# Patient Record
Sex: Female | Born: 1935 | Race: White | Hispanic: No | Smoking: Never smoker
Health system: Southern US, Community
[De-identification: ages and names within clinical notes are randomized; demographics above are authoritative.]

## PROBLEM LIST (undated history)

## (undated) DIAGNOSIS — I2510 Atherosclerotic heart disease of native coronary artery without angina pectoris: Secondary | ICD-10-CM

## (undated) DIAGNOSIS — I471 Supraventricular tachycardia, unspecified: Secondary | ICD-10-CM

## (undated) DIAGNOSIS — Z9889 Other specified postprocedural states: Secondary | ICD-10-CM

## (undated) DIAGNOSIS — M7121 Synovial cyst of popliteal space [Baker], right knee: Secondary | ICD-10-CM

## (undated) DIAGNOSIS — S42409A Unspecified fracture of lower end of unspecified humerus, initial encounter for closed fracture: Secondary | ICD-10-CM

## (undated) DIAGNOSIS — R0600 Dyspnea, unspecified: Secondary | ICD-10-CM

## (undated) DIAGNOSIS — E785 Hyperlipidemia, unspecified: Secondary | ICD-10-CM

## (undated) DIAGNOSIS — Z9289 Personal history of other medical treatment: Secondary | ICD-10-CM

## (undated) DIAGNOSIS — K921 Melena: Secondary | ICD-10-CM

## (undated) DIAGNOSIS — D649 Anemia, unspecified: Secondary | ICD-10-CM

## (undated) DIAGNOSIS — K219 Gastro-esophageal reflux disease without esophagitis: Secondary | ICD-10-CM

## (undated) DIAGNOSIS — I2789 Other specified pulmonary heart diseases: Secondary | ICD-10-CM

## (undated) DIAGNOSIS — I700 Atherosclerosis of aorta: Secondary | ICD-10-CM

## (undated) DIAGNOSIS — I499 Cardiac arrhythmia, unspecified: Secondary | ICD-10-CM

## (undated) DIAGNOSIS — M1990 Unspecified osteoarthritis, unspecified site: Secondary | ICD-10-CM

## (undated) DIAGNOSIS — H35371 Puckering of macula, right eye: Secondary | ICD-10-CM

## (undated) DIAGNOSIS — J45909 Unspecified asthma, uncomplicated: Secondary | ICD-10-CM

## (undated) DIAGNOSIS — J189 Pneumonia, unspecified organism: Secondary | ICD-10-CM

## (undated) DIAGNOSIS — I6529 Occlusion and stenosis of unspecified carotid artery: Secondary | ICD-10-CM

## (undated) DIAGNOSIS — D869 Sarcoidosis, unspecified: Secondary | ICD-10-CM

## (undated) DIAGNOSIS — T7840XA Allergy, unspecified, initial encounter: Secondary | ICD-10-CM

## (undated) HISTORY — PX: BLADDER SURGERY: SHX569

## (undated) HISTORY — DX: Hyperlipidemia, unspecified: E78.5

## (undated) HISTORY — PX: OTHER SURGICAL HISTORY: SHX169

## (undated) HISTORY — DX: Unspecified fracture of lower end of unspecified humerus, initial encounter for closed fracture: S42.409A

## (undated) HISTORY — DX: Allergy, unspecified, initial encounter: T78.40XA

## (undated) HISTORY — DX: Other specified postprocedural states: Z98.890

## (undated) HISTORY — PX: COLONOSCOPY: SHX174

## (undated) HISTORY — DX: Puckering of macula, right eye: H35.371

## (undated) HISTORY — DX: Personal history of other medical treatment: Z92.89

## (undated) HISTORY — PX: CARPAL TUNNEL RELEASE: SHX101

## (undated) HISTORY — DX: Occlusion and stenosis of unspecified carotid artery: I65.29

## (undated) HISTORY — DX: Supraventricular tachycardia: I47.1

## (undated) HISTORY — DX: Other specified pulmonary heart diseases: I27.89

## (undated) HISTORY — DX: Melena: K92.1

## (undated) HISTORY — DX: Synovial cyst of popliteal space (Baker), right knee: M71.21

## (undated) HISTORY — PX: TRAPEZIUM RESECTION: SHX2576

## (undated) HISTORY — DX: Gastro-esophageal reflux disease without esophagitis: K21.9

## (undated) HISTORY — DX: Sarcoidosis, unspecified: D86.9

## (undated) HISTORY — PX: POLYPECTOMY: SHX149

## (undated) HISTORY — PX: SHOULDER ARTHROSCOPY W/ ROTATOR CUFF REPAIR: SHX2400

## (undated) HISTORY — DX: Supraventricular tachycardia, unspecified: I47.10

## (undated) HISTORY — DX: Atherosclerosis of aorta: I70.0

## (undated) HISTORY — PX: FOOT SURGERY: SHX648

## (undated) HISTORY — DX: Unspecified osteoarthritis, unspecified site: M19.90

## (undated) HISTORY — DX: Atherosclerotic heart disease of native coronary artery without angina pectoris: I25.10

---

## 1993-04-26 ENCOUNTER — Encounter: Payer: Self-pay | Admitting: Internal Medicine

## 1998-03-02 ENCOUNTER — Ambulatory Visit (HOSPITAL_COMMUNITY): Admission: RE | Admit: 1998-03-02 | Discharge: 1998-03-02 | Payer: Self-pay | Admitting: *Deleted

## 1998-08-17 ENCOUNTER — Encounter: Payer: Self-pay | Admitting: *Deleted

## 1998-08-17 ENCOUNTER — Ambulatory Visit (HOSPITAL_COMMUNITY): Admission: RE | Admit: 1998-08-17 | Discharge: 1998-08-17 | Payer: Self-pay | Admitting: *Deleted

## 1998-08-28 ENCOUNTER — Ambulatory Visit (HOSPITAL_COMMUNITY): Admission: RE | Admit: 1998-08-28 | Discharge: 1998-08-28 | Payer: Self-pay | Admitting: *Deleted

## 1998-11-09 ENCOUNTER — Encounter: Payer: Self-pay | Admitting: *Deleted

## 1998-11-09 ENCOUNTER — Ambulatory Visit (HOSPITAL_COMMUNITY): Admission: RE | Admit: 1998-11-09 | Discharge: 1998-11-09 | Payer: Self-pay | Admitting: *Deleted

## 1999-04-30 ENCOUNTER — Encounter: Payer: Self-pay | Admitting: Internal Medicine

## 2000-04-06 ENCOUNTER — Other Ambulatory Visit: Admission: RE | Admit: 2000-04-06 | Discharge: 2000-04-06 | Payer: Self-pay | Admitting: *Deleted

## 2000-09-14 ENCOUNTER — Encounter: Payer: Self-pay | Admitting: *Deleted

## 2000-09-14 ENCOUNTER — Ambulatory Visit (HOSPITAL_COMMUNITY): Admission: RE | Admit: 2000-09-14 | Discharge: 2000-09-14 | Payer: Self-pay | Admitting: *Deleted

## 2001-04-05 ENCOUNTER — Encounter: Payer: Self-pay | Admitting: Family Medicine

## 2001-04-05 ENCOUNTER — Ambulatory Visit (HOSPITAL_COMMUNITY): Admission: RE | Admit: 2001-04-05 | Discharge: 2001-04-05 | Payer: Self-pay | Admitting: Family Medicine

## 2001-08-07 ENCOUNTER — Ambulatory Visit (HOSPITAL_BASED_OUTPATIENT_CLINIC_OR_DEPARTMENT_OTHER): Admission: RE | Admit: 2001-08-07 | Discharge: 2001-08-08 | Payer: Self-pay | Admitting: Orthopedic Surgery

## 2001-08-08 ENCOUNTER — Emergency Department (HOSPITAL_COMMUNITY): Admission: EM | Admit: 2001-08-08 | Discharge: 2001-08-09 | Payer: Self-pay | Admitting: Emergency Medicine

## 2002-01-22 ENCOUNTER — Ambulatory Visit (HOSPITAL_COMMUNITY): Admission: RE | Admit: 2002-01-22 | Discharge: 2002-01-22 | Payer: Self-pay | Admitting: Family Medicine

## 2002-01-22 ENCOUNTER — Encounter: Payer: Self-pay | Admitting: Family Medicine

## 2002-06-12 ENCOUNTER — Ambulatory Visit (HOSPITAL_COMMUNITY): Admission: RE | Admit: 2002-06-12 | Discharge: 2002-06-12 | Payer: Self-pay | Admitting: Family Medicine

## 2002-06-12 ENCOUNTER — Encounter: Payer: Self-pay | Admitting: Family Medicine

## 2002-07-08 ENCOUNTER — Encounter: Payer: Self-pay | Admitting: Orthopedic Surgery

## 2002-07-08 ENCOUNTER — Encounter: Admission: RE | Admit: 2002-07-08 | Discharge: 2002-07-08 | Payer: Self-pay | Admitting: Orthopedic Surgery

## 2002-07-09 ENCOUNTER — Ambulatory Visit (HOSPITAL_BASED_OUTPATIENT_CLINIC_OR_DEPARTMENT_OTHER): Admission: RE | Admit: 2002-07-09 | Discharge: 2002-07-09 | Payer: Self-pay | Admitting: Orthopedic Surgery

## 2002-10-01 ENCOUNTER — Ambulatory Visit (HOSPITAL_BASED_OUTPATIENT_CLINIC_OR_DEPARTMENT_OTHER): Admission: RE | Admit: 2002-10-01 | Discharge: 2002-10-02 | Payer: Self-pay | Admitting: Orthopedic Surgery

## 2003-05-08 ENCOUNTER — Ambulatory Visit (HOSPITAL_COMMUNITY): Admission: RE | Admit: 2003-05-08 | Discharge: 2003-05-08 | Payer: Self-pay | Admitting: Family Medicine

## 2003-05-19 ENCOUNTER — Ambulatory Visit (HOSPITAL_COMMUNITY): Admission: RE | Admit: 2003-05-19 | Discharge: 2003-05-19 | Payer: Self-pay | Admitting: Family Medicine

## 2003-06-03 ENCOUNTER — Ambulatory Visit (HOSPITAL_COMMUNITY): Admission: RE | Admit: 2003-06-03 | Discharge: 2003-06-03 | Payer: Self-pay | Admitting: Family Medicine

## 2003-06-13 ENCOUNTER — Ambulatory Visit (HOSPITAL_COMMUNITY): Admission: RE | Admit: 2003-06-13 | Discharge: 2003-06-13 | Payer: Self-pay

## 2003-12-30 ENCOUNTER — Ambulatory Visit: Payer: Self-pay | Admitting: Cardiology

## 2003-12-31 ENCOUNTER — Ambulatory Visit (HOSPITAL_COMMUNITY): Admission: RE | Admit: 2003-12-31 | Discharge: 2003-12-31 | Payer: Self-pay | Admitting: Family Medicine

## 2004-02-09 ENCOUNTER — Ambulatory Visit (HOSPITAL_COMMUNITY): Admission: RE | Admit: 2004-02-09 | Discharge: 2004-02-09 | Payer: Self-pay | Admitting: Family Medicine

## 2004-02-13 ENCOUNTER — Ambulatory Visit (HOSPITAL_COMMUNITY): Admission: RE | Admit: 2004-02-13 | Discharge: 2004-02-13 | Payer: Self-pay | Admitting: Family Medicine

## 2004-03-08 ENCOUNTER — Ambulatory Visit (HOSPITAL_COMMUNITY): Admission: RE | Admit: 2004-03-08 | Discharge: 2004-03-08 | Payer: Self-pay | Admitting: Family Medicine

## 2004-05-31 ENCOUNTER — Encounter: Payer: Self-pay | Admitting: Internal Medicine

## 2004-05-31 ENCOUNTER — Ambulatory Visit: Payer: Self-pay | Admitting: Internal Medicine

## 2004-05-31 ENCOUNTER — Ambulatory Visit (HOSPITAL_COMMUNITY): Admission: RE | Admit: 2004-05-31 | Discharge: 2004-05-31 | Payer: Self-pay | Admitting: Internal Medicine

## 2004-10-01 ENCOUNTER — Ambulatory Visit: Payer: Self-pay | Admitting: Cardiology

## 2004-10-20 ENCOUNTER — Ambulatory Visit: Payer: Self-pay

## 2005-01-12 ENCOUNTER — Ambulatory Visit (HOSPITAL_COMMUNITY): Admission: RE | Admit: 2005-01-12 | Discharge: 2005-01-12 | Payer: Self-pay | Admitting: Family Medicine

## 2005-05-30 ENCOUNTER — Ambulatory Visit (HOSPITAL_COMMUNITY): Admission: RE | Admit: 2005-05-30 | Discharge: 2005-05-30 | Payer: Self-pay | Admitting: Family Medicine

## 2005-07-19 ENCOUNTER — Ambulatory Visit (HOSPITAL_COMMUNITY): Admission: RE | Admit: 2005-07-19 | Discharge: 2005-07-19 | Payer: Self-pay | Admitting: Family Medicine

## 2005-08-09 ENCOUNTER — Ambulatory Visit: Payer: Self-pay | Admitting: Cardiology

## 2005-08-23 ENCOUNTER — Ambulatory Visit: Payer: Self-pay

## 2005-09-07 ENCOUNTER — Ambulatory Visit (HOSPITAL_COMMUNITY): Admission: RE | Admit: 2005-09-07 | Discharge: 2005-09-07 | Payer: Self-pay | Admitting: Family Medicine

## 2006-02-10 ENCOUNTER — Ambulatory Visit (HOSPITAL_COMMUNITY): Admission: RE | Admit: 2006-02-10 | Discharge: 2006-02-10 | Payer: Self-pay | Admitting: Family Medicine

## 2006-05-10 ENCOUNTER — Encounter: Admission: RE | Admit: 2006-05-10 | Discharge: 2006-05-10 | Payer: Self-pay | Admitting: Internal Medicine

## 2006-07-06 ENCOUNTER — Ambulatory Visit: Payer: Self-pay | Admitting: Internal Medicine

## 2006-11-21 ENCOUNTER — Ambulatory Visit: Payer: Self-pay | Admitting: Cardiology

## 2007-01-26 ENCOUNTER — Emergency Department (HOSPITAL_COMMUNITY): Admission: EM | Admit: 2007-01-26 | Discharge: 2007-01-26 | Payer: Self-pay | Admitting: Emergency Medicine

## 2007-02-05 ENCOUNTER — Ambulatory Visit (HOSPITAL_COMMUNITY): Admission: RE | Admit: 2007-02-05 | Discharge: 2007-02-05 | Payer: Self-pay | Admitting: Family Medicine

## 2007-03-05 ENCOUNTER — Ambulatory Visit (HOSPITAL_COMMUNITY): Admission: RE | Admit: 2007-03-05 | Discharge: 2007-03-05 | Payer: Self-pay | Admitting: Family Medicine

## 2007-08-15 ENCOUNTER — Ambulatory Visit: Payer: Self-pay | Admitting: Cardiology

## 2007-09-18 ENCOUNTER — Ambulatory Visit (HOSPITAL_COMMUNITY): Admission: RE | Admit: 2007-09-18 | Discharge: 2007-09-18 | Payer: Self-pay | Admitting: Internal Medicine

## 2007-09-25 ENCOUNTER — Other Ambulatory Visit: Admission: RE | Admit: 2007-09-25 | Discharge: 2007-09-25 | Payer: Self-pay | Admitting: Obstetrics and Gynecology

## 2007-11-30 ENCOUNTER — Encounter: Admission: RE | Admit: 2007-11-30 | Discharge: 2007-11-30 | Payer: Self-pay | Admitting: Orthopedic Surgery

## 2007-12-17 ENCOUNTER — Encounter: Admission: RE | Admit: 2007-12-17 | Discharge: 2007-12-17 | Payer: Self-pay | Admitting: Orthopedic Surgery

## 2008-03-17 ENCOUNTER — Ambulatory Visit (HOSPITAL_COMMUNITY): Admission: RE | Admit: 2008-03-17 | Discharge: 2008-03-17 | Payer: Self-pay | Admitting: Internal Medicine

## 2008-03-20 ENCOUNTER — Ambulatory Visit (HOSPITAL_COMMUNITY): Admission: RE | Admit: 2008-03-20 | Discharge: 2008-03-20 | Payer: Self-pay | Admitting: Internal Medicine

## 2008-03-24 ENCOUNTER — Encounter: Payer: Self-pay | Admitting: Cardiology

## 2008-03-24 ENCOUNTER — Ambulatory Visit: Payer: Self-pay

## 2008-08-08 DIAGNOSIS — E785 Hyperlipidemia, unspecified: Secondary | ICD-10-CM | POA: Insufficient documentation

## 2008-08-08 DIAGNOSIS — I471 Supraventricular tachycardia: Secondary | ICD-10-CM

## 2008-08-08 DIAGNOSIS — D869 Sarcoidosis, unspecified: Secondary | ICD-10-CM | POA: Insufficient documentation

## 2008-08-08 DIAGNOSIS — K219 Gastro-esophageal reflux disease without esophagitis: Secondary | ICD-10-CM | POA: Insufficient documentation

## 2008-08-14 ENCOUNTER — Ambulatory Visit: Payer: Self-pay | Admitting: Cardiology

## 2008-08-14 ENCOUNTER — Encounter: Payer: Self-pay | Admitting: Internal Medicine

## 2008-08-14 DIAGNOSIS — Z85828 Personal history of other malignant neoplasm of skin: Secondary | ICD-10-CM

## 2008-08-14 DIAGNOSIS — Z8669 Personal history of other diseases of the nervous system and sense organs: Secondary | ICD-10-CM

## 2008-08-14 DIAGNOSIS — M1990 Unspecified osteoarthritis, unspecified site: Secondary | ICD-10-CM | POA: Insufficient documentation

## 2008-08-18 ENCOUNTER — Ambulatory Visit: Payer: Self-pay | Admitting: Internal Medicine

## 2008-08-18 DIAGNOSIS — Z860100 Personal history of colon polyps, unspecified: Secondary | ICD-10-CM | POA: Insufficient documentation

## 2008-08-18 DIAGNOSIS — K625 Hemorrhage of anus and rectum: Secondary | ICD-10-CM | POA: Insufficient documentation

## 2008-08-18 DIAGNOSIS — R197 Diarrhea, unspecified: Secondary | ICD-10-CM | POA: Insufficient documentation

## 2008-08-19 ENCOUNTER — Ambulatory Visit: Payer: Self-pay | Admitting: Internal Medicine

## 2008-08-19 ENCOUNTER — Encounter: Payer: Self-pay | Admitting: Internal Medicine

## 2008-08-20 ENCOUNTER — Encounter: Payer: Self-pay | Admitting: Internal Medicine

## 2008-10-23 ENCOUNTER — Encounter: Payer: Self-pay | Admitting: Cardiology

## 2008-11-17 ENCOUNTER — Telehealth: Payer: Self-pay | Admitting: Cardiology

## 2008-11-20 ENCOUNTER — Ambulatory Visit: Payer: Self-pay | Admitting: Cardiology

## 2008-11-28 ENCOUNTER — Ambulatory Visit (HOSPITAL_COMMUNITY): Admission: RE | Admit: 2008-11-28 | Discharge: 2008-11-28 | Payer: Self-pay | Admitting: Internal Medicine

## 2008-11-28 ENCOUNTER — Encounter: Payer: Self-pay | Admitting: Cardiology

## 2008-11-28 ENCOUNTER — Ambulatory Visit: Payer: Self-pay

## 2008-11-28 ENCOUNTER — Ambulatory Visit: Payer: Self-pay | Admitting: Internal Medicine

## 2008-12-12 ENCOUNTER — Inpatient Hospital Stay (HOSPITAL_COMMUNITY): Admission: RE | Admit: 2008-12-12 | Discharge: 2008-12-15 | Payer: Self-pay | Admitting: Orthopedic Surgery

## 2009-02-21 HISTORY — PX: HIP SURGERY: SHX245

## 2009-02-23 DIAGNOSIS — R0989 Other specified symptoms and signs involving the circulatory and respiratory systems: Secondary | ICD-10-CM | POA: Insufficient documentation

## 2009-02-23 DIAGNOSIS — G5600 Carpal tunnel syndrome, unspecified upper limb: Secondary | ICD-10-CM | POA: Insufficient documentation

## 2009-02-23 DIAGNOSIS — E559 Vitamin D deficiency, unspecified: Secondary | ICD-10-CM | POA: Insufficient documentation

## 2009-03-27 ENCOUNTER — Emergency Department (HOSPITAL_COMMUNITY): Admission: EM | Admit: 2009-03-27 | Discharge: 2009-03-28 | Payer: Self-pay | Admitting: Emergency Medicine

## 2009-05-07 ENCOUNTER — Ambulatory Visit (HOSPITAL_COMMUNITY): Admission: RE | Admit: 2009-05-07 | Discharge: 2009-05-07 | Payer: Self-pay | Admitting: Internal Medicine

## 2009-06-05 ENCOUNTER — Encounter (INDEPENDENT_AMBULATORY_CARE_PROVIDER_SITE_OTHER): Payer: Self-pay

## 2009-06-25 ENCOUNTER — Ambulatory Visit (HOSPITAL_COMMUNITY): Admission: RE | Admit: 2009-06-25 | Discharge: 2009-06-25 | Payer: Self-pay | Admitting: Internal Medicine

## 2009-11-20 ENCOUNTER — Ambulatory Visit: Payer: Self-pay | Admitting: Cardiology

## 2009-12-08 ENCOUNTER — Encounter: Payer: Self-pay | Admitting: Cardiology

## 2009-12-08 ENCOUNTER — Ambulatory Visit: Payer: Self-pay

## 2010-03-14 ENCOUNTER — Encounter: Payer: Self-pay | Admitting: Internal Medicine

## 2010-03-14 ENCOUNTER — Encounter: Payer: Self-pay | Admitting: Family Medicine

## 2010-03-23 NOTE — Letter (Signed)
Summary: Recall Colonoscopy/Endoscopy, Change to Office Visit  [facility]  [address]   [place], [place] [address]   Phone: [phone]  Fax: [phone]      June 05, 2009   PALMYRA ROGACKI [address] [place], [place]  [address] Oct 05, 1934   Dear Ms. Sheeley,   According to our records, it is time for you to schedule a Colonoscopy/Endoscopy. However, after reviewing your medical record, we recommend an office visit in order to determine your need for a repeat procedure.  Please call [phone] at your convenience to schedule an office visit. If you have any questions or concerns, please feel free to contact our office.   Sincerely,   Cloria Spring LPN  [facility] Ph: [phone]   Fax: [phone]

## 2010-03-23 NOTE — Assessment & Plan Note (Signed)
Summary: per check out/sf   Visit Type:  Follow-up Primary Provider:  Larina Earthly, MD   History of Present Illness: Patient had her hip done, and she is doing well.  She went to [facility] for fifteen days.  Genevieve Norlander came in for a short time.  Does not have any cardiac symptoms.  Overall stable.  Has had no noticeable SVT, and now she has had virtually no problems at all.  Denies shortness of breath.  Current Medications (verified): 1)  Nexium 40 Mg Cpdr (Esomeprazole Magnesium) .... Take 1 Tablet By Mouth Once Daily As Needed 2)  Pravastatin Sodium 10 Mg Tabs (Pravastatin Sodium) .... Take 1 Tablet By Mouth Once A Day 3)  Cardizem Cd 180 Mg Xr24h-Cap (Diltiazem Hcl Coated Beads) .... Take 1 Capsule By Mouth Once A Day  Allergies: 1)  ! Sulfa 2)  ! Percocet 3)  ! Morphine 4)  ! * Merpergan Forte  Vital Signs:  Patient profile:   76 year old female Height:      63 inches Weight:      130.75 pounds BMI:     23.25 Pulse rate:   60 / minute Pulse rhythm:   regular Resp:     18 per minute BP sitting:   120 / 60  (left arm) Cuff size:   regular  Vitals Entered By: Vikki Ports (November 20, 2009 9:52 AM)  Physical Exam  General:  Well developed, well nourished, in no acute distress. Head:  normocephalic and atraumatic Eyes:  PERRLA/EOM intact; conjunctiva and lids normal. Lungs:  Clear bilaterally to auscultation and percussion. Heart:  PMI non displaced.  Normal S1 and S2.  Soft apical murmur noted laterally. Extremities:  No edema. Neurologic:  Alert and oriented x 3.   EKG  Procedure date:  11/20/2009  Findings:      NSR.  WNL.   Impression & Recommendations:  Problem # 1:  PAROXYSMAL SUPRAVENTRICULAR TACHYCARDIA (ICD-427.0)  No clinical recurrence. No problems with meds.  Need to avoid simva and other agents reviewed.    Her updated medication list for this problem includes:    Cardizem Cd 180 Mg Xr24h-cap (Diltiazem hcl coated beads) .Marland Kitchen... Take 1 capsule by  mouth once a day  Orders: EKG w/ Interpretation ([address])  Problem # 2:  HYPERLIPIDEMIA (ICD-272.4) followed by Dr. Felipa Eth Her updated medication list for this problem includes:    Pravastatin Sodium 10 Mg Tabs (Pravastatin sodium) .Marland Kitchen... 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  Last study now in EMR.  Will repeat doppler study for followup.  Continue medical therapy.   Orders: Carotid Duplex (Carotid Duplex)  Patient Instructions: 1)  Your physician recommends that you schedule a follow-up appointment in: 1 year 2)  Your physician recommends that you continue on your current medications as directed. Please refer to the Current Medication list given to you today. 3)  Your physician has requested that you have a carotid duplex. This test is an ultrasound of the carotid arteries in your neck. It looks at blood flow through these arteries that supply the brain with blood. Allow one hour for this exam. There are no restrictions or special instructions.

## 2010-04-16 ENCOUNTER — Other Ambulatory Visit: Payer: Self-pay | Admitting: Internal Medicine

## 2010-04-16 DIAGNOSIS — K219 Gastro-esophageal reflux disease without esophagitis: Secondary | ICD-10-CM

## 2010-04-28 ENCOUNTER — Other Ambulatory Visit: Payer: Self-pay

## 2010-05-03 ENCOUNTER — Ambulatory Visit
Admission: RE | Admit: 2010-05-03 | Discharge: 2010-05-03 | Disposition: A | Discharge status: Home / Self Care | Payer: Medicare Other | Source: Ambulatory Visit | Attending: Internal Medicine | Admitting: Internal Medicine

## 2010-05-03 DIAGNOSIS — K219 Gastro-esophageal reflux disease without esophagitis: Secondary | ICD-10-CM

## 2010-05-10 DIAGNOSIS — D131 Benign neoplasm of stomach: Secondary | ICD-10-CM | POA: Insufficient documentation

## 2010-05-27 LAB — URINALYSIS, ROUTINE W REFLEX MICROSCOPIC
Glucose, UA: NEGATIVE mg/dL
Hgb urine dipstick: NEGATIVE
Nitrite: NEGATIVE

## 2010-05-27 LAB — BASIC METABOLIC PANEL
BUN: 8 mg/dL (ref 6–23)
CO2: 28 mEq/L (ref 19–32)
CO2: 28 mEq/L (ref 19–32)
Creatinine, Ser: 0.68 mg/dL (ref 0.4–1.2)
GFR calc Af Amer: >60 mL/min (ref >60)
Potassium: 3.4 mEq/L — ABNORMAL LOW (ref 3.5–5.1)
Potassium: 3.9 mEq/L (ref 3.5–5.1)
Sodium: 136 mEq/L (ref 135–145)

## 2010-05-27 LAB — CBC
HCT: 26.8 % — ABNORMAL LOW (ref 36.0–46.0)
HCT: 28.1 % — ABNORMAL LOW (ref 36.0–46.0)
HCT: 41.5 % (ref 36.0–46.0)
Hemoglobin: 10.5 g/dL — ABNORMAL LOW (ref 12.0–15.0)
Hemoglobin: 14.2 g/dL (ref 12.0–15.0)
Hemoglobin: 9.5 g/dL — ABNORMAL LOW (ref 12.0–15.0)
MCHC: 33.8 g/dL (ref 30.0–36.0)
MCHC: 34.3 g/dL (ref 30.0–36.0)
MCHC: 34.3 g/dL (ref 30.0–36.0)
MCV: 99.1 fL (ref 78.0–100.0)
Platelets: 144 10*3/uL — ABNORMAL LOW (ref 150–400)
Platelets: 153 10*3/uL (ref 150–400)
Platelets: 209 10*3/uL (ref 150–400)
RDW: 12.2 % (ref 11.5–15.5)
RDW: 12.4 % (ref 11.5–15.5)
RDW: 12.7 % (ref 11.5–15.5)
WBC: 7.3 10*3/uL (ref 4.0–10.5)

## 2010-05-27 LAB — COMPREHENSIVE METABOLIC PANEL
ALT: 15 U/L (ref 0–35)
AST: 20 U/L (ref 0–37)
Albumin: 3.9 g/dL (ref 3.5–5.2)
BUN: 19 mg/dL (ref 6–23)
CO2: 27 mEq/L (ref 19–32)
Calcium: 9.3 mg/dL (ref 8.4–10.5)
Chloride: 105 mEq/L (ref 96–112)
Creatinine, Ser: 0.81 mg/dL (ref 0.4–1.2)
GFR calc Af Amer: >60 mL/min (ref >60)
GFR calc non Af Amer: >60 mL/min (ref >60)
Potassium: 4.1 mEq/L (ref 3.5–5.1)
Sodium: 140 mEq/L (ref 135–145)
Total Bilirubin: 0.4 mg/dL (ref 0.3–1.2)

## 2010-05-27 LAB — PROTIME-INR
INR: 1 (ref 0.00–1.49)
INR: 1.15 (ref 0.00–1.49)
INR: 1.72 — ABNORMAL HIGH (ref 0.00–1.49)
Prothrombin Time: 13.1 seconds (ref 11.6–15.2)
Prothrombin Time: 14.6 seconds (ref 11.6–15.2)
Prothrombin Time: 25.6 seconds — ABNORMAL HIGH (ref 11.6–15.2)

## 2010-05-27 LAB — APTT: aPTT: 26 seconds (ref 24–37)

## 2010-05-31 ENCOUNTER — Other Ambulatory Visit (HOSPITAL_COMMUNITY): Payer: Self-pay | Admitting: Internal Medicine

## 2010-05-31 DIAGNOSIS — Z139 Encounter for screening, unspecified: Secondary | ICD-10-CM

## 2010-06-03 ENCOUNTER — Ambulatory Visit (HOSPITAL_COMMUNITY): Payer: Medicare Other

## 2010-06-07 ENCOUNTER — Ambulatory Visit (HOSPITAL_COMMUNITY)
Admission: RE | Admit: 2010-06-07 | Discharge: 2010-06-07 | Disposition: A | Discharge status: Home / Self Care | Payer: Medicare Other | Source: Ambulatory Visit | Attending: Internal Medicine | Admitting: Internal Medicine

## 2010-06-07 ENCOUNTER — Ambulatory Visit (HOSPITAL_COMMUNITY): Payer: Medicare Other

## 2010-06-07 DIAGNOSIS — Z139 Encounter for screening, unspecified: Secondary | ICD-10-CM

## 2010-06-07 DIAGNOSIS — Z1231 Encounter for screening mammogram for malignant neoplasm of breast: Secondary | ICD-10-CM | POA: Insufficient documentation

## 2010-06-08 ENCOUNTER — Encounter: Payer: Self-pay | Admitting: Internal Medicine

## 2010-06-08 ENCOUNTER — Ambulatory Visit (INDEPENDENT_AMBULATORY_CARE_PROVIDER_SITE_OTHER): Payer: Medicare Other | Admitting: Internal Medicine

## 2010-06-08 DIAGNOSIS — Z860100 Personal history of colon polyps, unspecified: Secondary | ICD-10-CM

## 2010-06-08 DIAGNOSIS — K219 Gastro-esophageal reflux disease without esophagitis: Secondary | ICD-10-CM

## 2010-06-08 DIAGNOSIS — R933 Abnormal findings on diagnostic imaging of other parts of digestive tract: Secondary | ICD-10-CM

## 2010-06-08 DIAGNOSIS — D131 Benign neoplasm of stomach: Secondary | ICD-10-CM

## 2010-06-08 NOTE — Progress Notes (Signed)
HISTORY OF PRESENT ILLNESS:  [name] is a 76 y.o. female with the below listed medical history. She presents today regarding vague abdominal complaints and an abnormal upper GI series. She was last evaluated in June of 2010 regarding crampy lower abdominal pain, diarrhea, and rectal bleeding. There is a family history of colon cancer. See that dictation for details. She subsequently underwent complete colonoscopy 08/19/2008. This was remarkable for diminutive colon polyp which was removed and found to be a tubular adenoma. Otherwise normal examination except for internal hemorrhoids. Followup in 5 years recommended. Her current history is that of a sensation of hunger and weakness that has occurred periodically over the past few months. She describes a sensation of the knee deep, even if she has eaten recently. She does stay on Nexium chronically for reflux. She does report rare indigestion or pyrosis but does not feel that this is the cause for symptoms. She denies abdominal pain or weight loss. No bleeding. Upper GI series performed 05/03/2010 revealed evidence of reflux disease. No delay in the passage of the barium tablet though the cricopharyngeus muscle was prominent. Tertiary contractions present. Small polypoid defect in the body and antrum of the stomach noted. Possibly polyps. She is now sent.  REVIEW OF SYSTEMS:  All non-GI ROS negative except for sinus allergy trouble, arthritis, and fatigue.  Past Medical History  Diagnosis Date  . Paroxysmal supraventricular tachycardia   . Other chronic pulmonary heart diseases   . Other and unspecified hyperlipidemia   . Other symptoms involving cardiovascular system   . Personal history of other diseases of digestive system   . Sarcoidosis   . Esophageal reflux     Past Surgical History  Procedure Date  . Resection of trapezium     Rt.  . Carpal tunnel release     left.  . Shoulder arthroscopy w/ rotator cuff repair   . Foot  surgery     rt.  . Bladder surgery   . Arm surgery     rt.    Social History [name]  reports that she has never smoked. She has never used smokeless tobacco. She reports that she does not drink alcohol or use illicit drugs.  family history includes Colon cancer (age of onset:61) in her mother; Heart disease in her father; and Lung cancer in her father.  Allergies  Allergen Reactions  . Morphine   . Oxycodone-Acetaminophen   . Sulfonamide Derivatives        PHYSICAL EXAMINATION: Vital signs: BP 138/68  Pulse 84  Ht 5\' 3"  (1.6 m)  Wt 132 lb (59.875 kg)  BMI 23.38 kg/m2  Constitutional: generally well-appearing, no acute distress Psychiatric: alert and oriented x3, cooperative Eyes: extraocular movements intact, anicteric, conjunctiva pink Mouth: oral pharynx moist, no lesions Neck: supple no lymphadenopathy Cardiovascular: heart regular rate and rhythm, no murmur Lungs: clear to auscultation bilaterally Abdomen: soft, nontender, nondistended, no obvious ascites, no peritoneal signs, normal bowel sounds, no organomegaly Extremities: no lower extremity edema bilaterally Skin: no lesions on visible extremities Neuro: No focal deficits.  ASSESSMENT:  #1. Vague sensation of hunger and fatigue. Etiology uncertain. Does not appear to be reflux. #2. GERD. For the most part controlled with PPI. #3. Upper GI series describing polypoid lesions of the stomach.  PLAN:  #1. Continue PPI #2. Continue reflux precautions #3. Schedule upper endoscopy to evaluate abnormalities on upper GI series.The nature of the procedure, as well as the risks, benefits, and alternatives were carefully and  thoroughly reviewed with the patient. Ample time for discussion and questions allowed. The patient understood, was satisfied, and agreed to proceed.  #4. Family history of colon cancer and personal history of adenomatous colon polyps. Last examination 2010. Due for routine followup  2015

## 2010-06-08 NOTE — Patient Instructions (Signed)
EGD [facility] 4th floor 06/15/10 11:30 am Endoscopy brochure given for you to review.

## 2010-06-09 ENCOUNTER — Telehealth: Payer: Self-pay | Admitting: Internal Medicine

## 2010-06-09 NOTE — Telephone Encounter (Signed)
Called patient.  Wanted to reschedule her Endo and front desk scheduled for April 27th.

## 2010-06-15 ENCOUNTER — Other Ambulatory Visit: Payer: Medicare Other | Admitting: Internal Medicine

## 2010-06-17 ENCOUNTER — Encounter: Payer: Self-pay | Admitting: *Deleted

## 2010-06-18 ENCOUNTER — Ambulatory Visit (AMBULATORY_SURGERY_CENTER): Payer: Medicare Other | Admitting: Internal Medicine

## 2010-06-18 ENCOUNTER — Encounter: Payer: Self-pay | Admitting: Internal Medicine

## 2010-06-18 DIAGNOSIS — D131 Benign neoplasm of stomach: Secondary | ICD-10-CM

## 2010-06-18 DIAGNOSIS — R933 Abnormal findings on diagnostic imaging of other parts of digestive tract: Secondary | ICD-10-CM

## 2010-06-18 DIAGNOSIS — K219 Gastro-esophageal reflux disease without esophagitis: Secondary | ICD-10-CM

## 2010-06-18 DIAGNOSIS — K449 Diaphragmatic hernia without obstruction or gangrene: Secondary | ICD-10-CM

## 2010-06-18 MED ORDER — SODIUM CHLORIDE 0.9 % IV SOLN: 500.0000 mL | INTRAVENOUS | Status: DC

## 2010-06-18 NOTE — Patient Instructions (Signed)
Multiple gastric polyps seen and were biopsied.  A small hiatal hernia also seen.  Normal esophagus and duodenum seen.  Continue nexium.  Anti-reflux information given.  Follow up with Dr. Marina Goodell as needed.  You will receive a letter in 2 - 3 weeks with pathology results.  Continue your medications today at normal schedule.  Please call with any questions or concerns.

## 2010-06-21 ENCOUNTER — Telehealth: Payer: Self-pay | Admitting: *Deleted

## 2010-06-21 NOTE — Telephone Encounter (Signed)

## 2010-06-22 ENCOUNTER — Encounter: Payer: Self-pay | Admitting: Internal Medicine

## 2010-07-06 NOTE — Assessment & Plan Note (Signed)
NAMEMarland Kitchen  CHARLY, HUNTON               CHART#:  [id number]   DATE:  07/06/2006                       DOB:  1934-06-20   PRESENTING COMPLAINT:  Hematochezia.   SUBJECTIVE:  [name] is a 76 year old Caucasian female who is here for  scheduled visit.  She recently had bronchitis and a lot of coughing  spells.  She broke her rib.  She was on pain medicine and developed  constipation.  Yesterday, she noted fresh blood with her bowel movements  and then later she noted some more on tissue.  She did not see any blood  this morning.  Because her family history of colon carcinoma, she got  very concerned and thought she should be evaluated.  She denies  abdominal pain, anorexia or weight loss.  No history of frank bleeding.   [name] has undergone multiple colonoscopies in the past.  She had one  in May 1986 by Dr. Park Breed for rectal bleeding and she had internal  hemorrhoids.  Second colonoscopy was in March 1995 when she had a tiny  polyp removed from hepatic flexure which was hyperplastic.  She had  flexible sigmoidoscopy in June 1997 because of intermittent hematochezia  and it was a normal exam, other than external hemorrhoids.  She had  colonoscopy in March 2001 with removal of a small polyp from the sigmoid  colon which was an adenoma (6 mm).  Finally, her last colonoscopy was in  April 2006 with removal of two small polyps on cold biopsy, one was an  adenoma and second one was hyperplastic polyp and she had few  diverticula of the sigmoid colon.   She is on Cardizem 120 mg q.d., Boniva once a month, Lipitor 20 mg q.d.,  Nexium 40 mg q.a.m. p.r.n.   PAST MEDICAL HISTORY:  She has chronic GERD.  She had EGD back in April  1983 by Dr. Park Breed which showed changes in mild reflux esophagitis limited  to GE  junction.  Her symptoms are well-controlled with therapy,  although she forgets to take the medicine on a regular basis.  She has  tachyarrhythmia, easily controlled with calcium channel  blocker.  History of osteoporosis, diagnosed a few years ago, and history of  colonic adenomas, as above.   FAMILY HISTORY:  Significant for colon carcinoma in her mother at age 82  and she had a second primary at age 52 and by the time it was  discovered, she already had pathologic fracture to her femur and  metastatic disease.  She did not have periodic surveillance exam.  Father was treated for lung carcinoma but died of unrelated causes 20  years later at age 50.  She has two brothers and two sisters in good  health.   SOCIAL HISTORY:  She is widowed.  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  She does not smoke  cigarettes or drink alcohol.   OBJECTIVE:  Weight 127 pounds, she is 5 feet 3-1/2 inches tall, pulse 72  per minute, blood pressure 130/78, temp is 97.8.  Conjunctiva is pink,  sclera nonicteric.  Oropharyngeal mucosa is normal.  No neck masses are  noted.  Abdomen is soft and nontender without organomegaly or masses.  She has a small area of macular rash in right mid-abdomen.  Rectal  examination reveals no abnormality and her stool is guaiac negative.  No  clubbing noted.   ASSESSMENT:  Hematochezia in the setting of constipation, felt to be  either due to hemorrhoids or anorectal mucosal injury.  She has  undergone multiple colonoscopies in the last 22 years, most recent of it  was in April 2006 with removal of a small adenoma.  I do not feel that  she needs to be evaluated further unless her hematochezia is recurrent.   PLAN:  The patient was reassured.  She needs to be fiber supplement 3-4  grams per day.  Also, given prescription for MiraLax that she can take  17 grams daily and if it is too much, she can titrate the dose downward.  If bleeding is recurrent, would consider flexible sigmoidoscopy,  otherwise she will just have colonoscopy in April 2011.   ADDENDUM:  Lab studies from 05/03/2006 were reviewed.   Comprehensive  chemistry panel and CBC were all normal.  Her hemoglobin is 14.2.       Lionel December, M.D.  Electronically Signed     NR/MEDQ  D:  07/06/2006  T:  07/07/2006  Job:  [id number]

## 2010-07-06 NOTE — Assessment & Plan Note (Signed)
[facility]                            CARDIOLOGY OFFICE NOTE   [name], [name]                     MRN:          [id number]  DATE:11/21/2006                            DOB:          1934/06/22    [name] is in for a followup visit.  In general, she has been stable.  She has not had any recurrent arrhythmia.  She takes diltiazem CR 180 mg  daily.  This has helped suppress her rhythm.  When it was cut down to  120, she felt more symptomatic.  She has seen Dr. Felipa Eth for treatment of  her hypercholesterolemia.  She has been taken off of aspirin because of  hematochezia.  She has done well staying off of this.  She has not had  recurrent hematochezia.  The patient has also had a known right carotid  bruit for which she had Dopplers which were normal a year ago, and she  had mild pulmonary hypertension noted at echocardiogram in 2004, with a  prior history of sarcoidosis and lung disease.  She has not been having  any interim symptoms and actually feels pretty good.   CURRENT MEDICATIONS:  1. Cardizem XR 180 mg daily.  2. Lipitor 10 mg daily.  3. Nexium 40 mg daily.  4. Boniva 1 every month.   PHYSICAL EXAMINATION:  VITAL SIGNS:  Blood pressure 128/66, pulse 61,  there is a slight pectus.  LUNGS:  Lung fields are clear to auscultation and percussion with slight  decrease in breath sounds and perhaps slight prolonged expiration.  CARDIAC:  There is minimal if any systolic murmur.  There is a soft  right carotid bruit which has been noted previously.   The patient's electrocardiogram demonstrates normal sinus rhythm.  There  is a slight delay in R wave progression, likely related to lead  placement.  There was no evidence of right ventricular overload or  hypertension.  Intervals are normal.   CONCLUSIONS:  1. History of paroxysmal supraventricular tachycardia documented in      2003 for which she has been on diltiazem XR.  2.  Hypercholesterolemia on lipid-lowering therapy.  3. History of hematochezia, now off of aspirin.  4. History of right carotid bruit with normal carotid Dopplers.   PLAN:  1. Continue current medical regimen.  2. Return to clinic in 1 year.  3. At that time, a 2-D echocardiogram will be done because of the      history of mild elevation in pulmonary pressures in 2004 for which      currently there is no electrocardiographic or physical evidence on      examination.   ADDENDUM:  The patient has had a chest x-ray in [place] in the last  year.     Arturo Morton. Riley Kill, MD, [facility]  Electronically Signed    TDS/MedQ  DD: 11/21/2006  DT: 11/21/2006  Job #: [id number]

## 2010-07-06 NOTE — Letter (Signed)
August 15, 2007    Larina Earthly, M.D.  [address]  [place], [place] [address]   RE:  [name], [name]  MRN:  [id number]  /  DOB:  09/21/1934   Dear Dr. Felipa Eth:   I had the pleasure of seeing [name] in the office today in  followup.  In general, she is doing well.  About 3-4 months ago, she had  sharp quick chest discomfort, this was associated with some left arm  discomfort, it would only last for brief seconds.  It eventually went  away and she has had no recurrence.  She is able to walk and do pretty  much what she wants.  She did tell me that he ordered a bone density  yesterday and had put her on vitamin D, and her bone density was  actually improved.  She has not had recurrence of arrhythmia to her  understanding.   CURRENT MEDICATIONS:  1. Cardizem CD 180 mg daily.  2. Boniva 1 monthly.  3. Lipitor 10 mg daily.  4. Vitamin D 1 weekly.   PHYSICAL EXAMINATION:  GENERAL:  She is alert and oriented, in no  distress.  VITAL SIGNS:  Blood pressure is 124/70 and pulse is 57.  LUNGS:  The lung fields are clear.  CARDIAC:  Rhythm is regular.  I do not appreciate a significant murmur.   DIAGNOSTIC IMAGING:  Her electrocardiogram demonstrates sinus  bradycardia and is otherwise within normal limits.   Overall, she is doing well.  She denies any ongoing current symptoms.  The Cardizem has done a reasonable job of preventing her  supraventricular tachycardia and at the present time, I do not see a  specific need for an attempt at ablation.  I might consider the use of  Pravachol 40 mg daily for management of her hypercholesterolemia if that  is necessary.  Although, it is unlikely at such a low dose, there is a  potential drug interaction between Cardizem and Lipitor.  The likelihood  of this should be low, but I will leave it to your discretion.  Thanks  for allowing me to share her care.   ADDENDUM:  We had talked about doing an echocardiogram this year.  But  on her 2006  echocardiogram, her pulmonary pressures appeared to be  normal.  There is no clinical evidence of pulmonary hypertension and her  electrocardiogram is normal, so therefore we will not increase it.  Also, off note, is the history of sarcoid in the past.    Sincerely,      Arturo Morton. Riley Kill, MD, [facility]  Electronically Signed    TDS/MedQ  DD: 08/15/2007  DT: 08/16/2007  Job #: [id number]

## 2010-07-09 NOTE — Op Note (Signed)
NAME:  NOHA, MILBERGER                        ACCOUNT NO.:  [id number]   MEDICAL RECORD NO.:  [id number]                   PATIENT TYPE:  AMB   LOCATION:  DSC                                  FACILITY:  [facility]   PHYSICIAN:  Katy Fitch. Naaman Plummer., M.D.          DATE OF BIRTH:  [birth year]   DATE OF PROCEDURE:  10/01/2002  DATE OF DISCHARGE:                                 OPERATIVE REPORT   PREOPERATIVE DIAGNOSIS:  Massive right rotator cuff tear with chronic  impingement and type 3 acromial morphology and acromioclavicular joint  arthropathy.   POSTOPERATIVE DIAGNOSIS:  Massive right rotator cuff tear with chronic  impingement and type 3 acromial morphology and acromioclavicular joint  arthropathy.   OPERATION:  1. Diagnostic arthroscopy, right glenohumeral joint, identifying massive     rotator cuff tear. However, there was no sign of significant glenohumeral     degenerative arthritis. The long head of the biceps was noted to be     ruptured.  2. Open reconstruction of massive right rotator cuff tear with dorsal     transfer of subscapularis with repair of subscapularis to the greater     tuberosity and remnants of teres minor and infraspinatus with debridement     of chronically retracted supraspinatus rotator cuff tear followed  by     resection of the distal clavicle and subacromial decompression.   SURGEON:  Katy Fitch. Sypher, M.D.   ASSISTANT:  Jonni Sanger, P.A.   ANESTHESIA:  General orotracheal, supervising anesthesiologist Janetta Hora.  Gelene Mink, M.D.   INDICATIONS FOR PROCEDURE:  Debhora Titus is a well known 76 year old  patient who presented for evaluation  of chronic weakness and pain involving  the right shoulder. Clinical examination revealed signs of impingement and  marked weakness of the rotator cuff consistent with a rotator cuff tear.  Plain films demonstrated AC arthropathy and a type 3 acromion with minimal  degenerative change at the glenohumeral  joint. She was referred for an MRI  of her shoulder which revealed a massive retracted  rotator cuff tear with  atrophy of the supraspinatus muscle belly.   Given her chronic pain and weakness, we recommended proceeding with  reconstruction of the cuff to prevent chronic cuff tear arthropathy. We  anticipated proceeding with arthroscopic evaluation  of the glenohumeral  joint followed by open repair. After informed consent she is brought to the  operating room at this time.   DESCRIPTION OF PROCEDURE:  Terica Yogi is brought to the operating room  and placed in the supine position on the operating table. Following  induction of general orotracheal anesthesia she is carefully  positioned in  the beachchair position with the aid of the torso and head holder designed  for shoulder arthroscopy. The entire right upper extremity and forequarter  are prepped with Duraprep and draped with impervious arthroscopy drapes.   The shoulder was distended with 20 mL of sterile saline  and the scope was  introduced with standard blunt technique through a standard posterior  portal. The diagnostic arthroscopy of the glenohumeral joint revealed intact  hilar and articular cartilage surfaces on the glenoid and humeral head.  There is moderate effused and capsulitis tissues noted anteriorly. The  subscapularis tendon was intact.  The supraspinatus, infraspinatus and teres  minor were avulsed and retracted  to the level  of the glenoid. The teres  minor appeared  to be piled up posteriorly.   The long head of the biceps was ruptured and absent. The scope was removed  from the glenohumeral joint and the procedure transferred to an open  reconstruction of the rotator cuff.   An incision was fashioned from the distal 15 mm of the clavicle to  approximately  2 cm lateral  to the lateral aspect of the acromion. The  anterior deltoid was taken down sharply revealing a large type 3 acromion  and a calcified  coracoacromial ligament. The coracoacromial ligament and the  substantial anterior spur were removed with an oscillating saw, leveling the  acromion to a type 1 morphology. A large lateral  spur was likewise removed  with an oscillating saw and rongeur. A hand rasp was used to smooth the deep  surface of the acromion to a type 1 morphology.   The distal  clavicle was resected with subperiosteal dissection, revealing  the distal 15 mm followed  by resection with an oscillating saw. There was  no significant medial acromial lip.   The rotator cuff was gathered posteriorly. The teres minor and a portion of  the infraspinatus could be recovered. The supraspinatus was retracted  and  necrotic. This was debrided.   The superior 75% of the subscapularis was released with a scalpel off the  lesser tuberosity and split with Mayo scissors to the musculotendinous  junction. This was then transferred dorsally to a denuded area created on  the greater tuberosity measuring 2 cm in width and 15 mm in lateral medial  dimension as well as lowering the profile  of the greater tuberosity  approximately 3 mm.   Then 2 bio corkscrew anchors were placed, 1 at 6.5 mm anteriorly and a 2nd 5  mm posteriorly  with 2 Kevlar sutures. The subscapularis was advanced to the  greater tuberosity and secured with through bone McLaughlin sutures and 2  mattress sutures from the anchor. The infraspinatus and teres minor were  advanced anteriorly  and repaired with a baseball stitch to the  subscapularis and inset with through bone sutures and a McLaughlin suture  for tensioning and mattress sutures from the posterior  anchor to inset the  teres minor and infraspinatus to the denuded greater tuberosity.   The interval between  the subscapularis and the infraspinatus/teres minor  was repaired with a running suture of 2-0 Kevlar. The margin of the repair was then  finished with a series of mattress sutures of 0 Vicryl.  The  subacromial space was thoroughly lavaged with sterile saline followed  by  repair of the anterior deltoid to the trapezius following  the [facility] and distal  clavicle resection followed  by repair of the anterior 3rd of the deltoid to  the acromion and repair of the muscle split laterally with mattress sutures  of 0 Vicryl. The skin was repaired was subdermal sutures of 2-0 Vicryl and  intradermal 3-0 Prolene. Steri-Strips were applied.   There were no apparent complications. Ms. Tranchina tolerated  this major  surgery  well. Her vital signs remained stable throughout. Estimated blood  loss was approximately  50 mL. She was awakened from anesthesia, placed in a  sling  and transferred to the recovery room with stable vital signs. She will be  admitted to the recovery care center for observation of her vital signs  and  appropriate analgesic in the form of IV PCA morphine  and IV Ancef 1 gm  q.8h. as a prophylactic antibiotic.                                                Katy Fitch Naaman Plummer., M.D.    RVS/MEDQ  D:  10/01/2002  T:  10/01/2002  Job:  [id number]   cc:   Corrie Mckusick, M.D.  [address] [place]ure[place]s. [place]  [place]  [place] [address]  Fax: [phone]

## 2010-07-09 NOTE — Op Note (Signed)
[name]. [name]  Patient:    [name], [name] Visit Number: [id number] MRN: [id number]          Service Type: DSU Location: [facility] Attending Physician:  [name] Dictated by:   [name] [name]., M.D. Proc. Date: 08/07/01 Admit Date:  08/07/2001                             Operative Report  PREOPERATIVE DIAGNOSIS:  Bone-on-bone Eaton stage III degenerative arthritis, right thumb, carpometacarpal joint with 45 degrees hyperextension of metacarpophalangeal joint.  POSTOPERATIVE DIAGNOSIS:  Bone-on-bone Eaton stage III degenerative arthritis, right thumb, carpometacarpal joint with 45 degrees hyperextension of metacarpophalangeal joint.  OPERATION PERFORMED: 1. Resection of right trapezium. 2. Abductor pollicis longus to second metacarpal transfer/suspensionplasty    in the manner of Thompson, CPT code [id number]. 3. Free palmaris longus intermetacarpal ligament reconstruction between index    and thumb metacarpals, 25312-59 RT  SURGEON:  [name]. Mcgrath, [name]., M.D.  ASSISTANT:  [name], P.A.  ANESTHESIA:  Infraclavicular block.  ANESTHESIOLOGIST:  Dr. Krista Mcgrath.  INDICATIONS FOR PROCEDURE:  [name] is a 76 year old woman from [name] who was referred by Dr. Nobie Mcgrath for evaluation and management of a painful right thumb.  Approximately four years ago she began to experience pain in her thumb CMC joint.  She was initially evaluated by [name], M.D., who made the diagnosis on clinical examination and x-ray of degenerative arthritis of her right thumb CMC joint.  She had been treated with anti-inflammatory medication, splinting, activity modification and steroid injection all without complete relief.  [name] sought a surgical consultation at the [name] requesting trapezium excision and soft tissue interposition arthroplasty.  After informed consent,  she was brought to the operating room at this time. She understands it will be challenging to correct her hypertension at the MP joint.  This can typically be handled by proper recovery of height of her metacarpal with intermetacarpal ligament reconstruction as well as suspension plasty.  Occasionally, stabilization of the MP joint with either fusion or volar plication of the MP joint is required.  DESCRIPTION OF PROCEDURE:  Jax Abdelrahman was brought to the operating room and placed in supine position on the operating table.  Following placement of an infraclavicular block in the holding area, anesthesia was satisfactory in the right arm.  The arm was then prepped with Betadine soap and solution and sterilely draped.  One gram of Ancef was administered as an IV prophylactic antibiotic.  The right arm was exsanguinated with an Esmarch bandage and an arterial tourniquet on the proximal brachium was inflated to 250 mHg.  The procedure commenced with a curvilinear incision exposing the interval between the extensor pollicis brevis and abductor pollicis longus tendons.  The capsule of the carpometacarpal joint was incised longitudinally and the capsule and soft tissues elevated over the trapezium with a 64 [place] blade and 4 mm osteotome. Once the trapezium was fully exposed, it was morselized and removed piecemeal with a rongeur.  A complete synovectomy at the [facility] joint was accomplished.  The articular facet on the index metacarpal was identified and the a drill hole was created first with a bone awl followed by sequential drilling to 4.5 mm across the base of the index metacarpal distal to the articular facet for the thumb metacarpal.  A second drill hole was created through the base of  the thumb metacarpal at a 45 degree oblique angle beginning 1 cm dorsal to the articular surface and extending to the central portion of the metacarpal surface proximally.  The osteophyte formerly  adjacent to the index metacarpal was removed with a rongeur utilizing a large Joseph skin hook to retract the metacarpal.  After removal of all bony fragments, the CMC cavity was irrigated with sterile saline followed by triple antibiotic solution.  The palmaris longus was harvested through a short transverse incision in the distal wrist flexion crease with Brand tendon stripper.  This wound was repaired with intradermal 3-0 Prolene and Steri-Strip followed by cleaning of the muscle from the palmaris longus and placement in a saline soaked sponge.  The palmar slip of the abductor pollicis longus was released through a proximal incision at the musculotendinous junction, brought distally through the first dorsal compartment, reversed 180 degrees and brought through the drill hole in the thumb metacarpal up through the drill hole in the index metacarpal and secured with a Pulvertaft weave times three through the extensor carpi radialis brevis.  Care was taken to bury the knot deep.  The palmaris longus was then woven into the insertion of the extensor carpi radialis brevis, brought through the drill hole in the index metacarpal and woven around the tendon transfer of the abductor pollicis longus to create bulk between the index and long fingers followed by bringing this through the base of the thumb metacarpal and tying a pair of square knots around the abductor pollicis longus insertion creating a very satisfactory intermetacarpal ligament reconstruction further augmenting the suspensionplasty.  These knots were secured with interrupted sutures of 3-0 Ethibond.  The wounds were then irrigated thoroughly with sterile saline followed by triple antibiotic solution followed by repair of the skin with intradermal 3-0 Prolene and Steri-Strips.  0.25% Marcaine was infiltrated for postoperative analgesia, followed by release of the tourniquet.  The hand was dressed with a voluminous gauze  dressing with a thumb spica splint and volar plaster splint maintaining the wrist in 10 degrees dorsiflexion.  For after  care, she was transferred to the recovery room with stable vital signs.  She will be admitted to recovery care [facility] for observation of her vital signs and management of her pain with IV PCA morphine and or IV Dilaudid. Dictated by:   [name] [name]., M.D. Attending Physician:  [name] DD:  08/07/01 TD:  08/07/01 Job: [id number]

## 2010-07-09 NOTE — Op Note (Signed)
NAME:  [name], [name]                        ACCOUNT NO.:  [id number]   MEDICAL RECORD NO.:  [id number]                   PATIENT TYPE:  AMB   LOCATION:  DSC                                  FACILITY:  [facility]   PHYSICIAN:  Katy Fitch. Naaman Plummer., M.D.          DATE OF BIRTH:  04/30/1934   DATE OF PROCEDURE:  07/09/2002  DATE OF DISCHARGE:                                 OPERATIVE REPORT   PREOPERATIVE DIAGNOSIS:  Entrapment neuropathy, median nerve, left carpal  tunnel.   POSTOPERATIVE DIAGNOSIS:  Entrapment neuropathy, median nerve, left carpal  tunnel.   OPERATION:  Release of left transverse carpal ligament.   SURGEON:  Katy Fitch. Sypher, M.D.   ASSISTANT:  Marveen Reeks. Dasnoit, P.A.-C.   ANESTHESIA:  General by LMA.   ANESTHESIOLOGIST:  Bedelia Person, M.D.   INDICATIONS FOR PROCEDURE:  The patient is a 76 year old woman who is status  post reconstruction of the right thumb CMC joint and an ORIF of her right  distal radius and ulna.  She was noted to have numbness in her hands during  a recuperation from her right wrist ORIF, and was noted on orthopedic  diagnostic studies to have significant bilateral carpal tunnel syndrome,  left worse than right.  Due to a failure to respond to nonoperative  measures, she requests a release of her left transverse carpal ligament at  this time.   DESCRIPTION OF PROCEDURE:  The patient is brought to the operating room and  placed in the supine position on the operating room table.  Following  induction of general anesthesia by LMA, the left arm was prepped with  Betadine soap and solution and sterilely draped.  Following the  exsanguination of the limb with an Esmarch bandage, a Rumel tourniquet was  placed at 200 mmHg.  The procedure commenced with a short incision in the  line in the ring finger in the palm.  The subcutaneous tissues were  carefully divided in the palmar fascia.  This was split longitudinally for  the expansion of the  median nerve.  These were followed back to the  transverse carpal ligament which was carefully isolated from the median  nerve.  The ligament was then released along the [place] border, extending  into the distal forearm.  This widened the carpal canal.  No masses or other  predicaments were noted.  Bleeding points along the margin of this ligament  were electrocauterized with bipolar current, followed by a repair of the  skin with intradermal #3-0 Prolene suture.  Plain Marcaine 0.25% was applied to the skin margins of the wound for  postoperative analgesia.  The wound was dressed with Xeroform, sterile  gauze, and a volar plaster splint, maintaining the wrist in 5 degrees of  dorsiflexion.  Katy Fitch Naaman Plummer., M.D.   RVS/MEDQ  D:  07/09/2002  T:  07/09/2002  Job:  [id number]   cc:   Anesthesia Department

## 2010-07-09 NOTE — Op Note (Signed)
[name], [name]              ACCOUNT NO.:  [id number]   MEDICAL RECORD NO.:  [id number]          PATIENT TYPE:  AMB   LOCATION:  DAY                           FACILITY:  [facility]   PHYSICIAN:  Lionel December, M.D.    DATE OF BIRTH:  February 11, 1935   DATE OF PROCEDURE:  05/31/2004  DATE OF DISCHARGE:                                 OPERATIVE REPORT   PROCEDURE:  Colonoscopy.   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  Family history is positive for colon  carcinoma in her mother, initially at 85 or 49, and had another primary  about 16 years later.  She has chronic constipation but no other GI  symptoms.  She has been taking OTC product which is helping.   Procedures risks were reviewed with the patient; informed consent was  obtained.   PREOPERATIVE MEDICATION:  Demerol 25 mg IV, Versed 4 mg IV in divided dose.   FINDINGS:  Procedure performed in endoscopy suite.  The patient's vital  signs and O2 saturations were monitored during procedure and remained  stable.  The patient was placed in left lateral decubitus position and  rectal examination performed.  No abnormality noted on external or digital  exam.  Olympus video scope was placed in the rectum and advanced under  vision into sigmoid colon where a few tiny diverticula were noted.  Preparation was satisfactory.  Scope was passed cecum which was identified  by appendiceal orifice and ileocecal valve.  Pictures taken for the record.  As the scope was withdrawn, colonic mucosa was carefully examined.  There  were two tiny polyps at ascending colon located close to each other.  These  were ablated via cold biopsy and submitted one container.  Mucosa rest of  the colon was normal.  Rectal mucosa similarly was normal.  Scope was  retroflexed to examine anorectal junction which was unremarkable.  Endoscope  was straightened and withdrawn.  The patient tolerated the  procedure well.   FINAL DIAGNOSES:  1.  To small polyps and the ascending colon which were ablated via cold      biopsy.  2.  Tiny diverticula at sigmoid colon.   RECOMMENDATIONS:  1.  High-fiber diet.  2.  I will contact the patient with biopsy results.  Given her personal and      family history, she should return for repeat colonoscopy in 5 years from      now.      NR/MEDQ  D:  05/31/2004  T:  05/31/2004  Job:  [id number]   cc:   Larina Earthly, M.D.  [address]  [place]  [place] [address]  Fax: [phone]

## 2010-09-27 ENCOUNTER — Encounter: Payer: Self-pay | Admitting: Cardiology

## 2010-10-27 ENCOUNTER — Ambulatory Visit: Payer: Medicare Other | Admitting: Cardiology

## 2010-11-17 ENCOUNTER — Encounter: Payer: Self-pay | Admitting: Cardiology

## 2010-11-17 ENCOUNTER — Ambulatory Visit (INDEPENDENT_AMBULATORY_CARE_PROVIDER_SITE_OTHER): Payer: Medicare Other | Admitting: Cardiology

## 2010-11-17 DIAGNOSIS — I471 Supraventricular tachycardia: Secondary | ICD-10-CM

## 2010-11-17 DIAGNOSIS — R0989 Other specified symptoms and signs involving the circulatory and respiratory systems: Secondary | ICD-10-CM

## 2010-11-17 DIAGNOSIS — E785 Hyperlipidemia, unspecified: Secondary | ICD-10-CM

## 2010-11-17 NOTE — Assessment & Plan Note (Signed)
No clinical recurrence.  Continue diltiazem at current dose.

## 2010-11-17 NOTE — Assessment & Plan Note (Signed)
Dopplers from one year ago reviewed again.  She has 0-39% bilaterally, question of increase subclavian velocities, but BP are equal bilaterally.

## 2010-11-17 NOTE — Progress Notes (Signed)
HPI:  Doing well from a cardiac standpoint.  Minor shortness of breath, but does not walk much.  Last documented SVT 2003.  Has remained on diltiazem CD without recurrence.    Current Outpatient Prescriptions  Medication Sig Dispense Refill  . diltiazem (CARDIZEM CD) 180 MG 24 hr capsule Take 180 mg by mouth daily.        Marland Kitchen esomeprazole (NEXIUM) 40 MG capsule Take 40 mg by mouth daily before breakfast.        . pravastatin (PRAVACHOL) 10 MG tablet Take 10 mg by mouth daily.         Current Facility-Administered Medications  Medication Dose Route Frequency Provider Last Rate Last Dose  . 0.9 %  sodium chloride infusion  500 mL Intravenous Continuous Yancey Flemings, MD        Allergies  Allergen Reactions  . Morphine   . Oxycodone-Acetaminophen   . Sulfonamide Derivatives     Past Medical History  Diagnosis Date  . Paroxysmal supraventricular tachycardia   . Other chronic pulmonary heart diseases   . Other and unspecified hyperlipidemia   . Other symptoms involving cardiovascular system   . Personal history of other diseases of digestive system   . Sarcoidosis   . Esophageal reflux   . Allergy     SEASONAL  . Arthritis   . Osteoporosis   . HTN (hypertension)   . Carotid bruit   . Hematochezia   . Hx of colonoscopy     Past Surgical History  Procedure Date  . Resection of trapezium     Rt.  . Carpal tunnel release     left.  . Shoulder arthroscopy w/ rotator cuff repair   . Foot surgery     rt.  . Bladder surgery   . Arm surgery     rt.  . Colonoscopy   . Polypectomy     Family History  Problem Relation Age of Onset  . Colon cancer Mother 65  . Lung cancer Father   . Heart disease Father     History   Social History  . Marital Status: Widowed    Spouse Name: N/A    Number of Children: N/A  . Years of Education: N/A   Occupational History  . retired     still works as Lawyer   Social History Main Topics  . Smoking status: Never Smoker     . Smokeless tobacco: Never Used  . Alcohol Use: No  . Drug Use: No  . Sexually Active: Not on file   Other Topics Concern  . Not on file   Social History Narrative  . No narrative on file    ROS: Please see the HPI.  All other systems reviewed and negative.  PHYSICAL EXAM:  BP 135/60  Pulse 60  Ht 5\' 3"  (1.6 m)  Wt 130 lb (58.968 kg)  BMI 23.03 kg/m2 BP are equal bilaterally with no drop.   General: Well developed, well nourished, in no acute distress. Head:  Normocephalic and atraumatic. Neck: no JVD.  Soft R carotid bruit.  Less left bruit. Lungs: Clear to auscultation and percussion. Heart: Normal S1 and S2.  No murmur, rubs or gallops.  Abdomen:  Normal bowel sounds; soft; non tender; no organomegaly Pulses: Pulses normal in all 4 extremities. Extremities: No clubbing or cyanosis. No edema. Neurologic: Alert and oriented x 3.  EKG:  NSR.  Delay in R wave prob due to lead placement.   No acute changes.  ASSESSMENT AND PLAN:

## 2010-11-17 NOTE — Patient Instructions (Signed)
Your physician wants you to follow-up in: 1 YEAR. You will receive a reminder letter in the mail two months in advance. If you don't receive a letter, please call our office to schedule the follow-up appointment.  Your physician has requested that you have a carotid duplex in 1 YEAR. This test is an ultrasound of the carotid arteries in your neck. It looks at blood flow through these arteries that supply the brain with blood. Allow one hour for this exam. There are no restrictions or special instructions.  Your physician recommends that you continue on your current medications as directed. Please refer to the Current Medication list given to you today.   

## 2010-11-17 NOTE — Assessment & Plan Note (Signed)
On low dose pravachol and followed by Dr. Felipa Eth

## 2010-12-21 ENCOUNTER — Ambulatory Visit: Payer: Medicare Other | Admitting: Cardiology

## 2011-04-18 DIAGNOSIS — N318 Other neuromuscular dysfunction of bladder: Secondary | ICD-10-CM | POA: Insufficient documentation

## 2011-05-30 ENCOUNTER — Other Ambulatory Visit (HOSPITAL_COMMUNITY): Payer: Self-pay | Admitting: Internal Medicine

## 2011-05-30 DIAGNOSIS — Z139 Encounter for screening, unspecified: Secondary | ICD-10-CM

## 2011-06-09 ENCOUNTER — Ambulatory Visit (HOSPITAL_COMMUNITY)
Admission: RE | Admit: 2011-06-09 | Discharge: 2011-06-09 | Disposition: A | Discharge status: Home / Self Care | Payer: Medicare Other | Source: Ambulatory Visit | Attending: Internal Medicine | Admitting: Internal Medicine

## 2011-06-09 DIAGNOSIS — Z1231 Encounter for screening mammogram for malignant neoplasm of breast: Secondary | ICD-10-CM | POA: Insufficient documentation

## 2011-06-09 DIAGNOSIS — Z139 Encounter for screening, unspecified: Secondary | ICD-10-CM

## 2011-11-14 ENCOUNTER — Encounter (INDEPENDENT_AMBULATORY_CARE_PROVIDER_SITE_OTHER): Payer: Medicare Other

## 2011-11-14 DIAGNOSIS — R0989 Other specified symptoms and signs involving the circulatory and respiratory systems: Secondary | ICD-10-CM

## 2011-11-14 DIAGNOSIS — I6529 Occlusion and stenosis of unspecified carotid artery: Secondary | ICD-10-CM

## 2011-11-16 ENCOUNTER — Ambulatory Visit (HOSPITAL_COMMUNITY): Payer: Medicare Other | Admitting: Physical Therapy

## 2011-11-22 ENCOUNTER — Encounter: Payer: Self-pay | Admitting: Cardiology

## 2011-11-22 ENCOUNTER — Ambulatory Visit (INDEPENDENT_AMBULATORY_CARE_PROVIDER_SITE_OTHER): Payer: Medicare Other | Admitting: Cardiology

## 2011-11-22 VITALS — BP 120/62 | HR 55 | Resp 18 | Ht 63.0 in | Wt 128.0 lb

## 2011-11-22 DIAGNOSIS — I471 Supraventricular tachycardia: Secondary | ICD-10-CM

## 2011-11-22 DIAGNOSIS — I340 Nonrheumatic mitral (valve) insufficiency: Secondary | ICD-10-CM

## 2011-11-22 DIAGNOSIS — E785 Hyperlipidemia, unspecified: Secondary | ICD-10-CM

## 2011-11-22 DIAGNOSIS — R0989 Other specified symptoms and signs involving the circulatory and respiratory systems: Secondary | ICD-10-CM

## 2011-11-22 DIAGNOSIS — I059 Rheumatic mitral valve disease, unspecified: Secondary | ICD-10-CM

## 2011-11-22 NOTE — Assessment & Plan Note (Signed)
Dopplers today do not suggest progression.  Repeat in two years suggested

## 2011-11-22 NOTE — Assessment & Plan Note (Signed)
Mild by 2010 echo---no sig murmur at this point.

## 2011-11-22 NOTE — Patient Instructions (Addendum)
Your physician wants you to follow-up in: 1 YEAR with Dr McLean.  You will receive a reminder letter in the mail two months in advance. If you don't receive a letter, please call our office to schedule the follow-up appointment.  Your physician recommends that you continue on your current medications as directed. Please refer to the Current Medication list given to you today.  

## 2011-11-22 NOTE — Assessment & Plan Note (Signed)
Under the care of Dr. Avva 

## 2011-11-22 NOTE — Assessment & Plan Note (Signed)
No obvious recurrence 

## 2011-11-22 NOTE — Progress Notes (Signed)
HPI:  Overall the patient is doing well. She is no major complaints. She's not had major changes in her heart rate. She does get minimally short of breath when she goes upstairs. She noticed some numbness on her lip, but this was bilateral and she's had prior maxillary surgery. She noted that she had had some nerves cut at that time. There've been no unilateral symptoms, or other symptoms to suggest TIA.  Current Outpatient Prescriptions  Medication Sig Dispense Refill  . diltiazem (CARDIZEM CD) 180 MG 24 hr capsule Take 180 mg by mouth daily.        Marland Kitchen esomeprazole (NEXIUM) 40 MG capsule Take 40 mg by mouth daily before breakfast.        . pravastatin (PRAVACHOL) 10 MG tablet Take 10 mg by mouth daily.         Current Facility-Administered Medications  Medication Dose Route Frequency Provider Last Rate Last Dose  . 0.9 %  sodium chloride infusion  500 mL Intravenous Continuous Hilarie Fredrickson, MD        Allergies  Allergen Reactions  . Morphine   . Oxycodone-Acetaminophen   . Sulfonamide Derivatives     Past Medical History  Diagnosis Date  . Paroxysmal supraventricular tachycardia   . Other chronic pulmonary heart diseases   . Other and unspecified hyperlipidemia   . Other symptoms involving cardiovascular system   . Personal history of other diseases of digestive system   . Sarcoidosis   . Esophageal reflux   . Allergy     SEASONAL  . Arthritis   . Osteoporosis   . HTN (hypertension)   . Carotid bruit   . Hematochezia   . Hx of colonoscopy     Past Surgical History  Procedure Date  . Resection of trapezium     Rt.  . Carpal tunnel release     left.  . Shoulder arthroscopy w/ rotator cuff repair   . Foot surgery     rt.  . Bladder surgery   . Arm surgery     rt.  . Colonoscopy   . Polypectomy     Family History  Problem Relation Age of Onset  . Colon cancer Mother 62  . Lung cancer Father   . Heart disease Father     History   Social History  .  Marital Status: Widowed    Spouse Name: N/A    Number of Children: N/A  . Years of Education: N/A   Occupational History  . retired     still works as Lawyer   Social History Main Topics  . Smoking status: Never Smoker   . Smokeless tobacco: Never Used  . Alcohol Use: No  . Drug Use: No  . Sexually Active: Not on file   Other Topics Concern  . Not on file   Social History Narrative  . No narrative on file    ROS: Please see the HPI.  All other systems reviewed and negative.  PHYSICAL EXAM:  BP 120/62  Pulse 55  Resp 18  Ht 5\' 3"  (1.6 m)  Wt 128 lb (58.06 kg)  BMI 22.67 kg/m2  SpO2 96%  General: Well developed, well nourished, in no acute distress. Head:  Normocephalic and atraumatic. Neck: no JVD.  Very soft bilateral carotid bruits.   Lungs: Clear to auscultation and percussion. Heart: Normal S1 and S2.  No murmur, rubs or gallops.  Pulses: Pulses normal in all 4 extremities. Extremities: No clubbing or  cyanosis. No edema. Neurologic: Alert and oriented x 3.  EKG:  SB.  Nonspecific T abnormality.    ASSESSMENT AND PLAN:  We will have her follow up with Dr. Shirlee Latch.

## 2011-11-23 ENCOUNTER — Ambulatory Visit (HOSPITAL_COMMUNITY)
Admission: RE | Admit: 2011-11-23 | Discharge: 2011-11-23 | Disposition: A | Discharge status: Home / Self Care | Payer: Medicare Other | Source: Ambulatory Visit | Attending: Orthopedic Surgery | Admitting: Orthopedic Surgery

## 2011-11-23 DIAGNOSIS — M6281 Muscle weakness (generalized): Secondary | ICD-10-CM | POA: Insufficient documentation

## 2011-11-23 DIAGNOSIS — R262 Difficulty in walking, not elsewhere classified: Secondary | ICD-10-CM | POA: Insufficient documentation

## 2011-11-23 DIAGNOSIS — R29898 Other symptoms and signs involving the musculoskeletal system: Secondary | ICD-10-CM | POA: Insufficient documentation

## 2011-11-23 DIAGNOSIS — M5416 Radiculopathy, lumbar region: Secondary | ICD-10-CM | POA: Insufficient documentation

## 2011-11-23 DIAGNOSIS — IMO0001 Reserved for inherently not codable concepts without codable children: Secondary | ICD-10-CM | POA: Insufficient documentation

## 2011-11-23 DIAGNOSIS — M545 Low back pain, unspecified: Secondary | ICD-10-CM | POA: Insufficient documentation

## 2011-11-23 NOTE — Evaluation (Addendum)
Physical Therapy Evaluation  Patient Details  Name: [name] MRN: [id number] Date of Birth: [birth year]  Today's Date: 11/23/2011 Time: 1019-1112 PT Time Calculation (min): 53 min Charges: 1 eval, 10' NMR  Visit#: 1  of 4   Re-eval: 12/07/11 Assessment Diagnosis: Spinal Stenosis Next MD Visit: Dr. Despina Hick - 4-6 weeks  Authorization: MEDICARE  Authorization Time Period:    Authorization Visit#: 1  of 10    Past Medical History:  Past Medical History  Diagnosis Date  . Paroxysmal supraventricular tachycardia   . Other chronic pulmonary heart diseases   . Other and unspecified hyperlipidemia   . Other symptoms involving cardiovascular system   . Personal history of other diseases of digestive system   . Sarcoidosis   . Esophageal reflux   . Allergy     SEASONAL  . Arthritis   . Osteoporosis   . HTN (hypertension)   . Carotid bruit   . Hematochezia   . Hx of colonoscopy    Past Surgical History:  Past Surgical History  Procedure Date  . Resection of trapezium     Rt.  . Carpal tunnel release     left.  . Shoulder arthroscopy w/ rotator cuff repair   . Foot surgery     rt.  . Bladder surgery   . Arm surgery     rt.  . Colonoscopy   . Polypectomy     Subjective Symptoms/Limitations Symptoms: PMH: R THR, hyperlipidemia - controlled, GERD - controlled, decreased Vit D level (osteopenia) Pertinent History: Pt is referred to PT secondary to spinal stenosis which she reports is worse in her quadricep muscle and has complaints of her R leg gives way. She has intermittent pain about 50% of her day and has decreased symptoms when sitting.   How long can you stand comfortably?: no difficulty How long can you walk comfortably?: only when she does not have pain she can go long  Pain Assessment Currently in Pain?: Yes Pain Score:   5 (Range: 0-8/10) Pain Location: Leg Pain Orientation: Right Pain Radiating Towards: R quadricep  Pain Frequency:  Intermittent  Cognition/Observation Observation/Other Assessments Observations: normal quad tendon relex BLE, decreased to BLE achiles and R hamstring.  R knee flexion in prone 80 degress with pain, L knee flexion: 90 degress and quad tigthness.  Supine: normal knee flexion BLE Other Assessments: L thoracic scoliosis, without hip abn.   Sensation/Coordination/Flexibility/Functional Tests Coordination Gross Motor Movements are Fluid and Coordinated: No Coordination and Movement Description: Impaired coordination to TrA, PF and multifidus  Functional Tests Functional Tests: ODI: 40%  Assessment RLE Strength Right Hip Flexion:  (4+/5) Right Hip Extension: 3/5 Right Hip ABduction: 3+/5 Right Knee Flexion: 5/5 Right Knee Extension: 5/5 Right Ankle Dorsiflexion: 5/5 LLE Strength Left Hip Flexion: 4/5 Left Hip Extension: 2+/5 Left Hip ABduction: 3+/5 Left Knee Flexion: 5/5 Left Knee Extension: 5/5 Left Ankle Dorsiflexion: 5/5 Lumbar AROM Lumbar Flexion: decreased 30% Lumbar Extension: decreased 90% Lumbar - Right Side Bend: decreased 50% - increased pain Lumbar - Left Side Bend: decreased 50% Palpation Palpation: increased pain and tenderness to anterior R thigh and quadricep.  Increased pain to R hip flexor  Mobility/Balance  Ambulation/Gait Ambulation/Gait: Yes Gait Pattern: Antalgic;Decreased hip/knee flexion - right;Decreased weight shift to right Posture/Postural Control Posture/Postural Control: Postural limitations Postural Limitations: Thoracic kyphosis Static Standing Balance Single Leg Stance - Right Leg: 3  Single Leg Stance - Left Leg: 3  Tandem Stance - Right Leg: 6  Tandem Stance -  Left Leg: 8  Rhomberg - Eyes Opened: 10  Rhomberg - Eyes Closed: 10    Exercise/Treatments Stretches Hip Flexor Stretch: 1 rep;60 seconds;Limitations Hip Flexor Stretch Limitations: Thomas stretch w/A knee flexion and extension to pain Seated Other Seated Lumbar Exercises:  NMR for TrA, multifidus and PF contraction using VC and TC to encourage appropriate activiation.  Supine Bridge: 10 reps    Physical Therapy Assessment and Plan PT Assessment and Plan Clinical Impression Statement: Pt is a 77 year old female referred to PT for spinal stenosis w/radicular symptoms to R quadricep region.   Pt will benefit from skilled therapeutic intervention in order to improve on the following deficits: Decreased activity tolerance;Decreased balance;Decreased strength;Increased muscle spasms;Increased fascial restricitons;Pain;Impaired sensation;Improper body mechanics;Decreased range of motion;Impaired perceived functional ability;Impaired flexibility Rehab Potential: Fair Clinical Impairments Affecting Rehab Potential: secondary to 4 total visits PT Frequency: Min 2X/week PT Duration: Other (comment) (2 weeks) PT Treatment/Interventions: Manual techniques;Therapeutic activities;Therapeutic exercise;Balance training;Gait training;Neuromuscular re-education PT Plan: postural education, heel and toe roll in and outs, quad stretching, hamstring stretgching, hip flexor stretching.     Goals Home Exercise Program Pt will Perform Home Exercise Program: Independently PT Goal: Perform Home Exercise Program - Progress: Goal set today PT Short Term Goals Time to Complete Short Term Goals: 2 weeks PT Short Term Goal 1: Pt will report pain less than 3/10 for 50% of the day.  PT Short Term Goal 2: Pt will improve her ODI to les than 30% for improved QOL.  PT Short Term Goal 3: Pt will improve her core coordination and strength to WNL in order to tolerate sitting with appropriate posture x15 minutes.  PT Short Term Goal 4: Pt will be educated and instructed on appropriate core strengthening and stretching program to decrease pain.   Problem List Patient Active Problem List  Diagnosis  . DIARRHEA-PRESUMED INFECTIOUS  . SARCOIDOSIS, PULMONARY  . HYPERLIPIDEMIA  . PAROXYSMAL  SUPRAVENTRICULAR TACHYCARDIA  . GERD  . RECTAL BLEEDING  . DEGENERATIVE JOINT DISEASE  . CAROTID BRUIT, RIGHT  . ABDOMINAL PAIN -GENERALIZED  . SKIN CANCER, HX OF  . CARPAL TUNNEL SYNDROME, HX OF  . PERSONAL HX COLONIC POLYPS  . HEMATOCHEZIA, HX OF  . Mitral regurgitation  . Lumbar radiculopathy  . Leg weakness    PT Plan of Care PT Home Exercise Plan: see scanned report PT Patient Instructions: educated on core musculature, proper posture and importance of HEP.  DIscussed NS and CX policy Consulted and Agree with Plan of Care: Patient  GP Functional Limitation: Self care Self Care Current Status [phone]): At least 40 percent but less than 60 percent impaired, limited or restricted Self Care Goal Status (U0454): At least 20 percent but less than 40 percent impaired, limited or restricted  Phinehas Grounds 11/23/2011, 2:28 PM  Physician Documentation Your signature is required to indicate approval of the treatment plan as stated above.  Please sign and either send electronically or make a copy of this report for your files and return this physician signed original.   Please mark one 1.__approve of plan  2. ___approve of plan with the following conditions.   ______________________________                                                          _____________________ Physician Signature  Date  

## 2011-11-24 ENCOUNTER — Ambulatory Visit (HOSPITAL_COMMUNITY)
Admission: RE | Admit: 2011-11-24 | Discharge: 2011-11-24 | Disposition: A | Discharge status: Home / Self Care | Payer: Medicare Other | Source: Ambulatory Visit | Attending: Orthopedic Surgery | Admitting: Orthopedic Surgery

## 2011-11-24 NOTE — Progress Notes (Signed)
Physical Therapy Treatment Patient Details  Name: [name] MRN: [id number] Date of Birth: 08/22/1934  Today's Date: 11/24/2011 Time: 3086-5784 PT Time Calculation (min): 40 min  Visit#: 2  of 4   Re-eval: 12/07/11 Diagnosis: Spinal Stenosis Next MD Visit: Dr. Despina Hick - 4-6 weeks Charges:  therex 38'  Authorization: MEDICARE  Authorization Visit#: 2  of 10    Subjective: Symptoms/Limitations Symptoms: Pt. states she is having right much pain into the front/side of her R thigh with walking up to 7/10.   Pain Assessment Currently in Pain?: Yes Pain Score:   7 Pain Location: Leg Pain Orientation: Right Pain Radiating Towards: R lateral quadricep   Exercise/Treatments Stretches Active Hamstring Stretch: 3 reps;Limitations;20 seconds Active Hamstring Stretch Limitations: rope L, PROM R Single Knee to Chest Stretch: 3 reps;30 seconds Hip Flexor Stretch: 1 rep;60 seconds Hip Flexor Stretch Limitations: supine leg off side Quad Stretch: 3 reps;30 seconds;Limitations Quad Stretch Limitations: PROM Supine Bridge: 10 reps Straight Leg Raise: 5 reps;Limitations Straight Leg Raises Limitations: bilateral Other Supine Lumbar Exercises: hip adductor iso 10X5" Sidelying Clam: 5 reps;Limitations Clam Limitations: 10 seconds Prone  Straight Leg Raise: 5 reps Other Prone Lumbar Exercises: heelsqueeze 10X5" Other Prone Lumbar Exercises: traction to R LE 30" X 2      Physical Therapy Assessment and Plan PT Assessment and Plan Clinical Impression Statement: Began new exercises with some discomfort/difficulty performing stretches with the R LE. PROM stretch for hamstring on R LE. Pt. instructed to continue and be compliant with HEP for best results.  Gentle traction performed on R LE in prone with relief noted.  Will continue to require cueing for stablization. PT Duration:  (2 weeks) PT Plan: Continue per POC with postural education and strengthening.  Add heel and toe roll in  and outs next visit.      Problem List Patient Active Problem List  Diagnosis  . DIARRHEA-PRESUMED INFECTIOUS  . SARCOIDOSIS, PULMONARY  . HYPERLIPIDEMIA  . PAROXYSMAL SUPRAVENTRICULAR TACHYCARDIA  . GERD  . RECTAL BLEEDING  . DEGENERATIVE JOINT DISEASE  . CAROTID BRUIT, RIGHT  . ABDOMINAL PAIN -GENERALIZED  . SKIN CANCER, HX OF  . CARPAL TUNNEL SYNDROME, HX OF  . PERSONAL HX COLONIC POLYPS  . HEMATOCHEZIA, HX OF  . Mitral regurgitation  . Lumbar radiculopathy  . Leg weakness    PT - End of Session Activity Tolerance: Patient tolerated treatment well General Behavior During Session: [facility] for tasks performed Cognition: [facility] for tasks performed   Lurena Nida, PTA/CLT 11/24/2011, 2:37 PM

## 2011-11-29 ENCOUNTER — Ambulatory Visit (HOSPITAL_COMMUNITY)
Admission: RE | Admit: 2011-11-29 | Discharge: 2011-11-29 | Disposition: A | Discharge status: Home / Self Care | Payer: Medicare Other | Source: Ambulatory Visit | Attending: Orthopedic Surgery | Admitting: Orthopedic Surgery

## 2011-11-29 NOTE — Progress Notes (Signed)
Physical Therapy Treatment Patient Details  Name: [name] MRN: [id number] Date of Birth: 05-Jul-1934  Today's Date: 11/29/2011 Time: 1020-1105 PT Time Calculation (min): 45 min  Visit#: 3  of 4   Re-eval: 12/07/11  Charge: therex 42  Authorization: Medicare  Authorization Time Period:    Authorization Visit#: 3  of 10    Subjective: Symptoms/Limitations Symptoms: Pt stated right quad pain when walking.   Pain Assessment Currently in Pain?: Yes Pain Score:   5 Pain Location: Leg Pain Orientation: Right;Anterior;Proximal  Objective:   Exercise/Treatments Stretches Active Hamstring Stretch: 3 reps;Limitations;30 seconds Active Hamstring Stretch Limitations: rope Single Knee to Chest Stretch: 3 reps;30 seconds Hip Flexor Stretch: Limitations Hip Flexor Stretch Limitations: supine leg off side 2 sets; standing R hip flex 2x 30" Quad Stretch: 3 reps;30 seconds;Limitations Quad Stretch Limitations: prone with rope Seated Other Seated Lumbar Exercises: heel/ toe roll in out 5x 10" Supine Bridge: 15 reps Straight Leg Raise: 10 reps;Limitations Straight Leg Raises Limitations: bilateral Other Supine Lumbar Exercises: hip adductor iso 10X5" Sidelying Clam: Limitations;10 reps Clam Limitations: 10 seconds Prone  Straight Leg Raise: 10 reps;Limitations Straight Leg Raises Limitations: BLE Other Prone Lumbar Exercises: heelsqueeze 10X5"  Physical Therapy Assessment and Plan PT Assessment and Plan Clinical Impression Statement: Continued therex for lumbar and LE strengthening, pt did require vc-ing for stabilization with activities.  Added heel/toe roll ins with cueing for proper technique, pt able to demonstrate appropriate technique amd given written instructions to add to HEP.  Added prone quad stretch with pain reduced to 1/10 at end of session. PT Plan: Continue with current POC x 1 more session for postural strengthening.    Goals    Problem List Patient  Active Problem List  Diagnosis  . DIARRHEA-PRESUMED INFECTIOUS  . SARCOIDOSIS, PULMONARY  . HYPERLIPIDEMIA  . PAROXYSMAL SUPRAVENTRICULAR TACHYCARDIA  . GERD  . RECTAL BLEEDING  . DEGENERATIVE JOINT DISEASE  . CAROTID BRUIT, RIGHT  . ABDOMINAL PAIN -GENERALIZED  . SKIN CANCER, HX OF  . CARPAL TUNNEL SYNDROME, HX OF  . PERSONAL HX COLONIC POLYPS  . HEMATOCHEZIA, HX OF  . Mitral regurgitation  . Lumbar radiculopathy  . Leg weakness    PT - End of Session Activity Tolerance: Patient tolerated treatment well General Behavior During Session: [facility] for tasks performed Cognition: [facility] for tasks performed  GP    Juel Burrow 11/29/2011, 11:16 AM

## 2011-12-01 ENCOUNTER — Ambulatory Visit (HOSPITAL_COMMUNITY)
Admission: RE | Admit: 2011-12-01 | Discharge: 2011-12-01 | Disposition: A | Discharge status: Home / Self Care | Payer: Medicare Other | Source: Ambulatory Visit | Attending: Orthopedic Surgery | Admitting: Orthopedic Surgery

## 2011-12-01 NOTE — Evaluation (Signed)
Physical Therapy Discharge Summary Patient Details  Name: [name] MRN: [id number] Date of Birth: 10-Mar-1934  Today's Date: 12/01/2011 Time: 1020-1050 PT Time Calculation (min): 30 min Charges: 23' Self Care, 1 MMT Visit#: 4  of 4   Re-eval: 12/07/11 Assessment Diagnosis: Spinal Stenosis Next MD Visit: Dr. Despina Hick - unscheduled  Authorization: Medicare  Authorization Time Period:    Authorization Visit#: 4  of 10    Subjective Symptoms/Limitations Symptoms: Pt reports that she has been educated for all postural exercises.    Sensation/Coordination/Flexibility/Functional Tests Coordination Gross Motor Movements are Fluid and Coordinated: No Coordination and Movement Description: continues to have moderate impaired coordination to core musculature (improved to TrA and multifidus, decreased to PF) Functional Tests Functional Tests: ODI: 10%  (was 40%)  Assessment RLE Strength Right Hip Flexion: 5/5 (was 4+/5) Right Hip Extension: 4/5 (was 3/5) Right Hip ABduction: 4/5 (was 3+/5) LLE Strength Left Hip Flexion: 5/5 (was 4/5) Left Hip Extension: 4/5 (was 2+/5) Left Hip ABduction: 4/5 (was 3+/5)  Mobility/Balance  Ambulation/Gait Gait Pattern: Within Functional Limits Posture/Postural Control Posture/Postural Control: Postural limitations Postural Limitations: Thoracic kyphosis     Physical Therapy Assessment and Plan PT Assessment and Plan Clinical Impression Statement: [name] has attended 4 OP PT visits to focus on improving core strength and posture to decrease L leg pain w/following findings: continues to require min-mod cueing for posture, independent with HEP, improved LE strength and continues to have some dificulty with core coordination and activation.  She is aware of her posture and proper activation of core coordination and realizes it may take awhile for her to gain total strength.  She reports her pain as intermittent and usually is a 3-4/10 when it  does hurt.  At this time she is D/C from PT w/advanced HEP. PT Plan: D/C w/advanced HEP    Goals Pt will Perform Home Exercise Program: Independently: Met PT Short Term Goals: 2 weeks PT Short Term Goal 1: Pt will report pain less than 3/10 for 50% of the day. : Met PT Short Term Goal 2: Pt will improve her ODI to les than 30% for improved QOL. : Met (10%) PT Short Term Goal 3: Pt will improve her core coordination and strength to WNL in order to tolerate sitting with appropriate posture x15 minutes. : Progressing toward goal (Requires min-mod cueing for posture) PT Short Term Goal 4: Pt will be educated and instructed on appropriate core strengthening and stretching program to decrease pain.  Met  Problem List Patient Active Problem List  Diagnosis  . DIARRHEA-PRESUMED INFECTIOUS  . SARCOIDOSIS, PULMONARY  . HYPERLIPIDEMIA  . PAROXYSMAL SUPRAVENTRICULAR TACHYCARDIA  . GERD  . RECTAL BLEEDING  . DEGENERATIVE JOINT DISEASE  . CAROTID BRUIT, RIGHT  . ABDOMINAL PAIN -GENERALIZED  . SKIN CANCER, HX OF  . CARPAL TUNNEL SYNDROME, HX OF  . PERSONAL HX COLONIC POLYPS  . HEMATOCHEZIA, HX OF  . Mitral regurgitation  . Lumbar radiculopathy  . Leg weakness   PT - End of Session Activity Tolerance: Patient tolerated treatment well General Behavior During Session: [name] for tasks performed Cognition: [name] for tasks performed PT Plan of Care PT Home Exercise Plan: see scanned report of D/C HEP PT Patient Instructions: discussed in detail about her posture, ODI findings and improtance to continue with her HEP Consulted and Agree with Plan of Care: Patient  GP Functional Limitation: Self care Self Care Current Status (W0981): At least 1 percent but less than 20 percent impaired, limited or  restricted Self Care Goal Status [phone]): At least 1 percent but less than 20 percent impaired, limited or restricted Self Care Discharge Status [phone]): At least 1 percent but less than 20 percent  impaired, limited or restricted  [name], PT 12/01/2011, 10:55 AM  Physician Documentation Your signature is required to indicate approval of the treatment plan as stated above.  Please sign and either send electronically or make a copy of this report for your files and return this physician signed original.   Please mark one 1.__approve of plan  2. ___approve of plan with the following conditions.   ______________________________                                                          _____________________ Physician Signature                                                                                                             Date

## 2011-12-13 ENCOUNTER — Encounter: Payer: Self-pay | Admitting: Cardiology

## 2011-12-13 NOTE — Telephone Encounter (Signed)
Pt returning nurse call [facility] Mess) regarding test results, she can be reached at hM#

## 2011-12-13 NOTE — Telephone Encounter (Signed)
This encounter was created in error - please disregard.

## 2012-01-14 ENCOUNTER — Ambulatory Visit (INDEPENDENT_AMBULATORY_CARE_PROVIDER_SITE_OTHER): Payer: Medicare Other | Admitting: Family Medicine

## 2012-01-14 VITALS — BP 126/60 | HR 77 | Temp 97.9°F | Resp 18 | Ht 63.5 in | Wt 131.0 lb

## 2012-01-14 DIAGNOSIS — L02519 Cutaneous abscess of unspecified hand: Secondary | ICD-10-CM

## 2012-01-14 DIAGNOSIS — M79609 Pain in unspecified limb: Secondary | ICD-10-CM

## 2012-01-14 DIAGNOSIS — M79641 Pain in right hand: Secondary | ICD-10-CM

## 2012-01-14 DIAGNOSIS — L03119 Cellulitis of unspecified part of limb: Secondary | ICD-10-CM

## 2012-01-14 DIAGNOSIS — Z23 Encounter for immunization: Secondary | ICD-10-CM

## 2012-01-14 DIAGNOSIS — S61409A Unspecified open wound of unspecified hand, initial encounter: Secondary | ICD-10-CM

## 2012-01-14 MED ORDER — TETANUS-DIPHTH-ACELL PERTUSSIS 5-2.5-18.5 LF-MCG/0.5 IM SUSP: 0.5000 mL | Freq: Once | INTRAMUSCULAR | Status: DC

## 2012-01-14 MED ORDER — CEPHALEXIN 500 MG PO CAPS: 500.0000 mg | ORAL_CAPSULE | Freq: Four times a day (QID) | ORAL | Status: DC

## 2012-01-14 NOTE — Patient Instructions (Addendum)
Take ibuprofen 200mg  3 every 8 hrs for the pain and inflammation  Use warm compresses and hydrocortizone cream for comfort   Take antibiotics until finished

## 2012-01-14 NOTE — Progress Notes (Signed)
[facility]:  Office Visit  Chief Complaint:  Chief Complaint  Patient presents with  . Hand Pain    right, injury    HPI: [name] is a 77 y.o. female who complains of  Right hand pain after scratched herself on her grandchild's dog's kennel. She scraped it. She has pain, she has swelling, she has redness. Deneis fevers, chills. Not UTD on Tdap. Tried alcohol, peroxide without releif.   Past Medical History  Diagnosis Date  . Paroxysmal supraventricular tachycardia   . Other chronic pulmonary heart diseases   . Other and unspecified hyperlipidemia   . Other symptoms involving cardiovascular system   . Personal history of other diseases of digestive system   . Sarcoidosis   . Esophageal reflux   . Allergy     SEASONAL  . Arthritis   . Osteoporosis   . HTN (hypertension)   . Carotid bruit   . Hematochezia   . Hx of colonoscopy    Past Surgical History  Procedure Date  . Resection of trapezium     Rt.  . Carpal tunnel release     left.  . Shoulder arthroscopy w/ rotator cuff repair   . Foot surgery     rt.  . Bladder surgery   . Arm surgery     rt.  . Colonoscopy   . Polypectomy    History   Social History  . Marital Status: Widowed    Spouse Name: N/A    Number of Children: N/A  . Years of Education: N/A   Occupational History  . retired     still works as Lawyer   Social History Main Topics  . Smoking status: Never Smoker   . Smokeless tobacco: Never Used  . Alcohol Use: No  . Drug Use: No  . Sexually Active: None   Other Topics Concern  . None   Social History Narrative  . None   Family History  Problem Relation Age of Onset  . Colon cancer Mother 77  . Lung cancer Father   . Heart disease Father    Allergies  Allergen Reactions  . Morphine   . Oxycodone-Acetaminophen   . Sulfonamide Derivatives    Prior to Admission medications   Medication Sig Start Date End Date Taking? Authorizing Provider   diltiazem (CARDIZEM CD) 180 MG 24 hr capsule Take 180 mg by mouth daily.     Yes Historical Provider, MD  esomeprazole (NEXIUM) 40 MG capsule Take 40 mg by mouth daily before breakfast.     Yes Historical Provider, MD  pravastatin (PRAVACHOL) 10 MG tablet Take 40 mg by mouth daily.    Yes Historical Provider, MD  Vitamin D, Ergocalciferol, (DRISDOL) 50000 UNITS CAPS Take 50,000 Units by mouth.   Yes Historical Provider, MD     ROS: The patient denies fevers, chills, night sweats, unintentional weight loss, chest pain, palpitations, wheezing, dyspnea on exertion, nausea, vomiting, abdominal pain, dysuria, hematuria, melena, numbness, weakness, or tingling.   All other systems have been reviewed and were otherwise negative with the exception of those mentioned in the HPI and as above.    PHYSICAL EXAM: Filed Vitals:   01/14/12 1725  BP: 126/60  Pulse: 77  Temp: 97.9 F (36.6 C)  Resp: 18   Filed Vitals:   01/14/12 1725  Height: 5' 3.5" (1.613 m)  Weight: 131 lb (59.421 kg)   Body mass index is 22.84 kg/(m^2).  General: Alert, no acute  distress HEENT:  Normocephalic, atraumatic, oropharynx patent.  Cardiovascular:  Regular rate and rhythm, no rubs murmurs or gallops.  No Carotid bruits, radial pulse intact. No pedal edema.  Respiratory: Clear to auscultation bilaterally.  No wheezes, rales, or rhonchi.  No cyanosis, no use of accessory musculature GI: No organomegaly, abdomen is soft and non-tender, positive bowel sounds.  No masses. Skin: No rashes. Neurologic: Facial musculature symmetric. Psychiatric: Patient is appropriate throughout our interaction. Lymphatic: No cervical lymphadenopathy Musculoskeletal: Gait intact. Right hand dorsal  warm, red, swollen. + radial pulse, good cap refill ROM, 5/5 sensation intact   LABS:    EKG/XRAY:   Primary read interpreted by Dr. Conley Rolls at [name].   ASSESSMENT/PLAN: Encounter Diagnoses  Name Primary?  . Cellulitis of hand Yes  .  Hand pain, right    Patient was originially rx Keflex but pharmacy has PCN and cephlaosporin allergy on file,patient does not remember so will out her on doxycycline 100 mg BID Take Ibuprofen prn F/u prn    LE, THAO PHUONG, DO 01/14/2012 5:49 PM

## 2012-03-12 DIAGNOSIS — M62830 Muscle spasm of back: Secondary | ICD-10-CM | POA: Insufficient documentation

## 2012-03-16 ENCOUNTER — Telehealth: Payer: Self-pay

## 2012-03-16 NOTE — Telephone Encounter (Signed)
DR LE,   PT CALLED ABOUT BILL RECEIVED FROM DATE OF SERVICE 01/14/12 WHEN SHE WAS EVALUATED FOR A SCRATCH SHE GOT FROM AN ANIMAL CAGE. PATIENT STATES SHE HAD WOUNDS ON HER HAND FROM THE INCIDENT BUT ONLY THE CELLULITIS DIAGNOSIS CODE WAS DOCUMENTED. MEDICARE WILL NOT PAY FOR A TDAP UNLESS THE PATIENT WAS TREATED FOR A WOUND. DO YOU FEEL THAT IT IS APPROPRIATE TO ADD A WOUND DIAGNOSIS CODE TO THESE CHARGES SO THAT THE CLAIM CAN BE RESUBMITTED TO MEDICARE FOR ADDITIONAL PAYMENT OF THE TDAP INJECTION? IF SO PLEASE PROVIDE THE DIAGNOSIS CODE YOU FEEL IS APPROPRIATE AND ONCE DOCUMENTED IN THE PATIENTS MEDICAL RECORD FOR THAT DATE THE CLAIM CAN BE RESUBMITTED.   PLEASE ADVISE BILLING   THANK YOU,  ANDREA

## 2012-03-16 NOTE — Telephone Encounter (Signed)
Yes, patient had wound, please add this code. Amy / Dr Conley Rolls. [phone]

## 2012-03-21 NOTE — Telephone Encounter (Signed)
Unsure of need for addendum, chart indicates already she scratched on kennell. Under HPI. See me if ?

## 2012-03-21 NOTE — Telephone Encounter (Signed)
I WILL SEND A CORRECTED CLAIM TO INSURANCE COMPANY BUT MAKE SURE THERE IS AN ADENDUM TO REFLECT THIS DIAGNOSIS CODE IN PATIENTS MR   THANK YOU   ANDREA

## 2012-05-29 ENCOUNTER — Other Ambulatory Visit (HOSPITAL_COMMUNITY): Payer: Self-pay | Admitting: Internal Medicine

## 2012-05-29 DIAGNOSIS — Z139 Encounter for screening, unspecified: Secondary | ICD-10-CM

## 2012-06-12 ENCOUNTER — Other Ambulatory Visit (HOSPITAL_COMMUNITY): Payer: Self-pay | Admitting: Internal Medicine

## 2012-06-12 ENCOUNTER — Ambulatory Visit (HOSPITAL_COMMUNITY)
Admission: RE | Admit: 2012-06-12 | Discharge: 2012-06-12 | Disposition: A | Discharge status: Home / Self Care | Payer: Medicare Other | Source: Ambulatory Visit | Attending: Internal Medicine | Admitting: Internal Medicine

## 2012-06-12 DIAGNOSIS — Z139 Encounter for screening, unspecified: Secondary | ICD-10-CM

## 2012-06-12 DIAGNOSIS — Z1231 Encounter for screening mammogram for malignant neoplasm of breast: Secondary | ICD-10-CM | POA: Insufficient documentation

## 2012-06-18 ENCOUNTER — Telehealth: Payer: Self-pay | Admitting: Nurse Practitioner

## 2012-06-18 NOTE — Telephone Encounter (Signed)
Reviewed with a patient that [name] has reviewed MMG and that this new wording is simply a result of new guidelines where breast density is "graded" but for future MMG, 3D MMG would be recommended.  Discussed this new technology briefly and patient will review it again with PG at [facility].  She states she would be willing to pay the extra cost and she will request it next year.  Since it was not offered to patient by the facility where patient was seen, advised that she may need to check as not all locations have this technology.  varbalized understanding and is willing to come to [place] next year to get 3D MMG if her location does not have it.

## 2012-06-18 NOTE — Telephone Encounter (Signed)
Patient is concerned of the wording of her letter about her mammogram. See report for 05/29/2012 done at [facility]. Last year report in patient paper chart on your desk. Please advise. sue

## 2012-06-18 NOTE — Telephone Encounter (Signed)
Patient has some questions for nurse re: recent mmg showing dense tissue. Patient will fax over copy of report in case we don't have it yet.

## 2012-06-21 HISTORY — PX: LEG SURGERY: SHX1003

## 2012-08-02 ENCOUNTER — Non-Acute Institutional Stay (SKILLED_NURSING_FACILITY): Payer: Medicare Other | Admitting: Internal Medicine

## 2012-08-02 DIAGNOSIS — S7292XS Unspecified fracture of left femur, sequela: Secondary | ICD-10-CM

## 2012-08-02 DIAGNOSIS — S8290XS Unspecified fracture of unspecified lower leg, sequela: Secondary | ICD-10-CM

## 2012-08-02 DIAGNOSIS — D62 Acute posthemorrhagic anemia: Secondary | ICD-10-CM

## 2012-08-02 DIAGNOSIS — K219 Gastro-esophageal reflux disease without esophagitis: Secondary | ICD-10-CM

## 2012-08-02 DIAGNOSIS — E785 Hyperlipidemia, unspecified: Secondary | ICD-10-CM

## 2012-08-22 DIAGNOSIS — D62 Acute posthemorrhagic anemia: Secondary | ICD-10-CM

## 2012-08-22 HISTORY — DX: Acute posthemorrhagic anemia: D62

## 2012-08-22 NOTE — Progress Notes (Signed)
Patient ID: [name], female   DOB: 1935-01-04, 78 y.o.   MRN: [id number]        HISTORY & PHYSICAL  DATE: 08/02/2012   FACILITY: [facility]  LEVEL OF CARE: SNF (31)  ALLERGIES:  Allergies  Allergen Reactions  . Morphine   . Oxycodone-Acetaminophen   . Sulfonamide Derivatives    CHIEF COMPLAINT:  Manage left hip fracture, hyperlipidemia, and GERD.    HISTORY OF PRESENT ILLNESS:  The patient is a 78 year-old, Caucasian female.    HIP FRACTURE: The patient had a mechanical fall and sustained a femur fracture.  Patient subsequently underwent surgical repair and tolerated the procedure well. Patient is admitted to this facility for short-term rehabilitation. Patient denies hip pain currently. No complications reported from the pain medications currently being used.   HYPERLIPIDEMIA: No complications from the medications presently being used. Last fasting lipid panel showed : A recent lipid panel is not available.   GERD: pt's GERD is stable.  Denies ongoing heartburn, abd. Pain, nausea or vomiting.  Currently on a PPI & tolerates it without any adverse reactions.   PAST MEDICAL HISTORY :  Past Medical History  Diagnosis Date  . Paroxysmal supraventricular tachycardia   . Other chronic pulmonary heart diseases   . Other and unspecified hyperlipidemia   . Other symptoms involving cardiovascular system   . Personal history of other diseases of digestive system   . Sarcoidosis   . Esophageal reflux   . Allergy     SEASONAL  . Arthritis   . Osteoporosis   . HTN (hypertension)   . Carotid bruit   . Hematochezia   . Hx of colonoscopy     PAST SURGICAL HISTORY: Past Surgical History  Procedure Laterality Date  . Resection of trapezium      Rt.  . Carpal tunnel release      left.  . Shoulder arthroscopy w/ rotator cuff repair    . Foot surgery      rt.  . Bladder surgery    . Arm surgery      rt.  . Colonoscopy    . Polypectomy     History of  total hip replacement.  SOCIAL HISTORY:  reports that she has never smoked. She has never used smokeless tobacco. She reports that she does not drink alcohol or use illicit drugs. HOUSING:  She lives alone.   FAMILY HISTORY:  Family History  Problem Relation Age of Onset  . Colon cancer Mother 30  . Lung cancer Father   . Heart disease Father    Leukemia              Father  CURRENT MEDICATIONS: Reviewed per [facility]  REVIEW OF SYSTEMS:  See HPI otherwise 14 point ROS is negative.  PHYSICAL EXAMINATION  VS:  T        P 85      RR 20      BP 152/67      POX 98% room air        WT (Lb) 58 kg  GENERAL: no acute distress, normal body habitus SKIN: warm & dry, no suspicious lesions or rashes, no excessive dryness, left hip incision clean and dry and closed  EYES: conjunctivae normal, sclerae normal, normal eye lids MOUTH/THROAT: lips without lesions,no lesions in the mouth,tongue is without lesions,uvula elevates in midline NECK: supple, trachea midline, no neck masses, no thyroid tenderness, no thyromegaly LYMPHATICS: no LAN in the neck,  no supraclavicular LAN RESPIRATORY: breathing is even & unlabored, BS CTAB CARDIAC: RRR, no murmur,no extra heart sounds EDEMA/VARICOSITIES: left lower extremity has +1 edema GI:  ABDOMEN: abdomen soft, normal BS, no masses, no tenderness  LIVER/SPLEEN: no hepatomegaly, no splenomegaly MUSCULOSKELETAL: HEAD: normal to inspection & palpation BACK: no kyphosis, scoliosis or spinal processes tenderness EXTREMITIES: LEFT UPPER EXTREMITY: full range of motion, normal strength & tone RIGHT UPPER EXTREMITY:  full range of motion, normal strength & tone LEFT LOWER EXTREMITY: strength intact, range of motion not tested due to surgery  RIGHT LOWER EXTREMITY: strength intact, range of motion minimal  PSYCHIATRIC: the patient is alert & oriented to person, affect & behavior appropriate  LABS/RADIOLOGY: CBC normal.   PT 9.9, INR 1, PTT 22.7.  BMP normal.    Left hip x-ray showed new left displaced hip fracture.   ASSESSMENT/PLAN:  Displaced left hip fracture.  Status post repair.  Continue rehabilitation.   Hyperlipidemia.  Continue statin.    GERD.  Well controlled.    Acute blood loss anemia.  Reassess hemoglobin level.   Check CBC and BMP.   I have reviewed patient's medical records received at admission/from hospitalization.  CPT CODE: 08657

## 2012-09-05 ENCOUNTER — Encounter: Payer: Self-pay | Admitting: Adult Health

## 2012-09-05 ENCOUNTER — Non-Acute Institutional Stay (SKILLED_NURSING_FACILITY): Payer: Medicare Other | Admitting: Adult Health

## 2012-09-05 DIAGNOSIS — E785 Hyperlipidemia, unspecified: Secondary | ICD-10-CM

## 2012-09-05 DIAGNOSIS — S7222XD Displaced subtrochanteric fracture of left femur, subsequent encounter for closed fracture with routine healing: Secondary | ICD-10-CM

## 2012-09-05 DIAGNOSIS — I471 Supraventricular tachycardia, unspecified: Secondary | ICD-10-CM

## 2012-09-05 DIAGNOSIS — S7222XA Displaced subtrochanteric fracture of left femur, initial encounter for closed fracture: Secondary | ICD-10-CM | POA: Insufficient documentation

## 2012-09-05 DIAGNOSIS — S7290XD Unspecified fracture of unspecified femur, subsequent encounter for closed fracture with routine healing: Secondary | ICD-10-CM

## 2012-09-05 DIAGNOSIS — K219 Gastro-esophageal reflux disease without esophagitis: Secondary | ICD-10-CM

## 2012-09-05 DIAGNOSIS — Z8679 Personal history of other diseases of the circulatory system: Secondary | ICD-10-CM | POA: Insufficient documentation

## 2012-09-05 DIAGNOSIS — K5900 Constipation, unspecified: Secondary | ICD-10-CM | POA: Insufficient documentation

## 2012-09-05 HISTORY — DX: Displaced subtrochanteric fracture of left femur, initial encounter for closed fracture: S72.22XA

## 2012-09-05 NOTE — Progress Notes (Signed)
  Subjective:    Patient ID: [name], female    DOB: 1934/09/22, 78 y.o.   MRN: [id number]  HPI This is a 78 year old female who is for discharge home with home health PT, OT and nursing. She has been admitted to [facility] place on 07/27/12 from [facility] with closed left subtrochanteric femur fracture status post open/close and reduction, internal medullary fixation. Patient has completed SNF rehabilitation and therapy has cleared the patient for discharge.   Review of Systems  Constitutional: Negative.   HENT: Negative.   Eyes: Negative.   Respiratory: Negative for cough and shortness of breath.   Cardiovascular: Negative for leg swelling.  Gastrointestinal: Negative for abdominal pain and abdominal distention.  Endocrine: Negative.   Genitourinary: Negative.   Neurological: Negative.   Hematological: Negative for adenopathy. Does not bruise/bleed easily.  Psychiatric/Behavioral: Negative.        Objective:   Physical Exam  Nursing note and vitals reviewed. Constitutional: She is oriented to person, place, and time. She appears well-developed and well-nourished.  HENT:  Head: Normocephalic and atraumatic.  Right Ear: External ear normal.  Left Ear: External ear normal.  Nose: Nose normal.  Mouth/Throat: Oropharynx is clear and moist.  Eyes: Conjunctivae and EOM are normal. Pupils are equal, round, and reactive to light.  Neck: Normal range of motion. Neck supple. No thyromegaly present.  Cardiovascular: Normal rate, regular rhythm, normal heart sounds and intact distal pulses.   Pulmonary/Chest: Effort normal and breath sounds normal.  Abdominal: Soft. Bowel sounds are normal.  Musculoskeletal: Normal range of motion. She exhibits no edema and no tenderness.  Neurological: She is alert and oriented to person, place, and time.  Skin: Skin is warm and dry.  Psychiatric: She has a normal mood and affect. Her behavior is normal. Judgment and thought content  normal.     LABS/PROCEDURES: 08/22/12 cholesterol 134 triglyceride 104 HDL 48 LDL 65 SGOT 13 SGPT 10 alkaline phosphatase 165 total protein 5.5 albumin 3.2 08/07/12 WBC 5.5 hemoglobin 10.9 hematocrit 33.3 sodium 140 potassium 4.5 glucose 82 BUN 16 creatinine 0.56 caution 8.2     Medications reviewed.     Assessment & Plan:   Constipation stable-  Closed left subtrochanteric femur fracture S/P Open/closed and reduction, internal medullary fixation  - 4 home health PT, OT and Nursing  HYPERLIPIDEMIA stable-   PAROXYSMAL SUPRAVENTRICULAR TACHYCARDIA - rate controlled   GERD - Stable    Total discharge time: >30 minutes Discharge time involved coordination of the discharge process with the social worker, nursing staff and therapy department. Medical justification for home health services verified.    CPT CODE: 40981

## 2012-10-18 ENCOUNTER — Encounter: Payer: Self-pay | Admitting: Cardiology

## 2012-10-18 DIAGNOSIS — Z8781 Personal history of (healed) traumatic fracture: Secondary | ICD-10-CM | POA: Insufficient documentation

## 2012-10-18 DIAGNOSIS — R002 Palpitations: Secondary | ICD-10-CM | POA: Insufficient documentation

## 2012-11-20 ENCOUNTER — Encounter: Payer: Self-pay | Admitting: *Deleted

## 2012-11-21 ENCOUNTER — Encounter: Payer: Self-pay | Admitting: Cardiology

## 2012-11-21 ENCOUNTER — Ambulatory Visit (INDEPENDENT_AMBULATORY_CARE_PROVIDER_SITE_OTHER): Payer: Medicare Other | Admitting: Cardiology

## 2012-11-21 VITALS — BP 132/60 | HR 67 | Wt 131.0 lb

## 2012-11-21 DIAGNOSIS — I059 Rheumatic mitral valve disease, unspecified: Secondary | ICD-10-CM

## 2012-11-21 DIAGNOSIS — I340 Nonrheumatic mitral (valve) insufficiency: Secondary | ICD-10-CM

## 2012-11-21 DIAGNOSIS — E785 Hyperlipidemia, unspecified: Secondary | ICD-10-CM

## 2012-11-21 DIAGNOSIS — I471 Supraventricular tachycardia: Secondary | ICD-10-CM

## 2012-11-21 NOTE — Patient Instructions (Signed)
Your physician wants you to follow-up in: 6 months with Dr McLean. (April 2015). You will receive a reminder letter in the mail two months in advance. If you don't receive a letter, please call our office to schedule the follow-up appointment.  

## 2012-11-21 NOTE — Progress Notes (Signed)
Patient ID: [name], female   DOB: [birth year], 78 y.o.   MRN: [id number] PCP: Dr. Felipa Eth  78 y.o with history pulmonary sarcoidosis and HTN presents for followup.  She has been seen by Dr. Riley Kill in the past and is seen by me for the first time today.  She has been followed by cardiology because of a history of SVT. She takes diltiazem CD for this. Recently, she has been doing well.  No tachypalpitations.  She walks with a cane.  Chronic DOE with fasting walking.  This has been present for a long time.  No chest pain.  She had a fall in 6/14 with a femur fracture requiring repair.   ECG: NSR, normal  PMH: 1. Hyperlipidemia 2. Sarcoidosis 3. H/o SVT 4. Carotid bruits: carotid dopplers (9/13) with minimal carotid stenosis.  5. Echo (10/10) with EF 60-65%, mild MR.  6. Femur fracture in 6/14 s/p repair.   SH: Widow, lives in [place], nonsmoker, retired Runner, broadcasting/film/video, lives alone.   FH: Father with "heart disease"  ROS: All systems reviewed and negative except as per HPI.   Current Outpatient Prescriptions  Medication Sig Dispense Refill  . diltiazem (CARDIZEM CD) 180 MG 24 hr capsule Take 180 mg by mouth daily.        Marland Kitchen esomeprazole (NEXIUM) 40 MG capsule Take 40 mg by mouth daily before breakfast.        . pravastatin (PRAVACHOL) 40 MG tablet Take 40 mg by mouth daily.      . Vitamin D, Ergocalciferol, (DRISDOL) 50000 UNITS CAPS Take 50,000 Units by mouth.       Current Facility-Administered Medications  Medication Dose Route Frequency Provider Last Rate Last Dose  . 0.9 %  sodium chloride infusion  500 mL Intravenous Continuous Hilarie Fredrickson, MD       BP 132/60  Pulse 67  Wt 59.421 kg (131 lb)  BMI 22.84 kg/m2 General: NAD Neck: No JVD, no thyromegaly or thyroid nodule.  Lungs: Clear to auscultation bilaterally with normal respiratory effort. CV: Nondisplaced PMI.  Heart regular S1/S2, no S3/S4, no murmur.  No peripheral edema.  Bilateral carotid bruits.  Normal pedal pulses.   Abdomen: Soft, nontender, no hepatosplenomegaly, no distention.  Skin: Intact without lesions or rashes.  Neurologic: Alert and oriented x 3.  Psych: Normal affect. Extremities: No clubbing or cyanosis.   Assessment/Plan:  1. SVT: No symptomatic recurrence.  Continue diltiazem CD. 2. Carotid bruits: 9/13 carotid dopplers with minimal disease.   Will call PCP for a copy of labs.   [name] 11/21/2012

## 2012-11-23 DIAGNOSIS — J984 Other disorders of lung: Secondary | ICD-10-CM | POA: Insufficient documentation

## 2012-11-27 ENCOUNTER — Other Ambulatory Visit: Payer: Self-pay | Admitting: Internal Medicine

## 2012-11-27 DIAGNOSIS — J984 Other disorders of lung: Secondary | ICD-10-CM

## 2012-11-28 ENCOUNTER — Ambulatory Visit
Admission: RE | Admit: 2012-11-28 | Discharge: 2012-11-28 | Disposition: A | Discharge status: Home / Self Care | Payer: Medicare Other | Source: Ambulatory Visit | Attending: Internal Medicine | Admitting: Internal Medicine

## 2012-11-28 ENCOUNTER — Ambulatory Visit (INDEPENDENT_AMBULATORY_CARE_PROVIDER_SITE_OTHER): Payer: Medicare Other | Admitting: Nurse Practitioner

## 2012-11-28 ENCOUNTER — Encounter: Payer: Self-pay | Admitting: Nurse Practitioner

## 2012-11-28 VITALS — BP 136/64 | HR 64 | Resp 20 | Ht 62.75 in | Wt 134.0 lb

## 2012-11-28 DIAGNOSIS — J984 Other disorders of lung: Secondary | ICD-10-CM

## 2012-11-28 DIAGNOSIS — Z01419 Encounter for gynecological examination (general) (routine) without abnormal findings: Secondary | ICD-10-CM

## 2012-11-28 NOTE — Progress Notes (Signed)
Patient ID: [name], female   DOB: 1934-05-28, 78 y.o.   MRN: [id number]  78 y.o. G34P1001 Widowed Caucasian Fe here for annual exam.   On Prolia 1/13 & 7/13 & 1/14 for osteoporosis.  While in [place] she was having pain in left leg and saw MD at [facility].  Then a few days later fractured her leg on her way to bathroom and had rod placement in [place].  Transferred back to [place] to [facility].  She received PT from June 5 to July 18th.  Now ambulates with a cane.  Some pain in lower back.  Recent evaluation by Dr. Virgel Manifold and ? Nodule right lung but CT scan today is negative.  No LMP recorded. Patient is postmenopausal.          Sexually active: no  The current method of family planning is abstinence.    Exercising: no  The patient does not participate in regular exercise at present. Smoker:  no  Health Maintenance: Pap:  11/12/09, WNL MMG:  06/13/12, BI-Rads 2: benign Colonoscopy:  06/18/10, 5 yr recall BMD:   2009, ? Dr. Virgel Manifold TDaP:  12/2011 Labs: PCP, pt brought copies   reports that she has never smoked. She has never used smokeless tobacco. She reports that she does not drink alcohol or use illicit drugs.  Past Medical History  Diagnosis Date  . Paroxysmal supraventricular tachycardia   . Other chronic pulmonary heart diseases   . Other and unspecified hyperlipidemia   . Other symptoms involving cardiovascular system   . Personal history of other diseases of digestive system   . Sarcoidosis   . Esophageal reflux   . Allergy     SEASONAL  . Arthritis   . Osteoporosis   . HTN (hypertension)   . Carotid bruit   . Hematochezia   . Hx of colonoscopy     Past Surgical History  Procedure Laterality Date  . Trapezium resection Right   . Carpal tunnel release Left   . Shoulder arthroscopy w/ rotator cuff repair    . Foot surgery Right   . Bladder surgery    . Arm surgery Right   . Colonoscopy    . Polypectomy      Current Outpatient Prescriptions  Medication Sig Dispense  Refill  . diltiazem (CARDIZEM CD) 180 MG 24 hr capsule Take 180 mg by mouth daily.        Marland Kitchen esomeprazole (NEXIUM) 40 MG capsule Take 40 mg by mouth daily before breakfast.        . pravastatin (PRAVACHOL) 40 MG tablet Take 40 mg by mouth daily.      . Vitamin D, Ergocalciferol, (DRISDOL) 50000 UNITS CAPS Take 50,000 Units by mouth.       Current Facility-Administered Medications  Medication Dose Route Frequency Provider Last Rate Last Dose  . 0.9 %  sodium chloride infusion  500 mL Intravenous Continuous Hilarie Fredrickson, MD        Family History  Problem Relation Age of Onset  . Colon cancer Mother 34  . Lung cancer Father   . Heart disease Father     ROS:  Pertinent items are noted in HPI.  Otherwise, a comprehensive ROS was negative.  Exam:   BP 136/64  Pulse 64  Resp 20  Ht 5' 2.75" (1.594 m)  Wt 134 lb (60.782 kg)  BMI 23.92 kg/m2 Height: 5' 2.75" (159.4 cm)  Ht Readings from Last 3 Encounters:  11/28/12 5' 2.75" (1.594 m)  01/14/12 5' 3.5" (1.613 m)  11/22/11 5\' 3"  (1.6 m)    General appearance: alert, cooperative and appears stated age Head: Normocephalic, without obvious abnormality, atraumatic Neck: no adenopathy, supple, symmetrical, trachea midline and thyroid normal to inspection and palpation Lungs: clear to auscultation bilaterally Breasts: normal appearance, no masses or tenderness Heart: regular rate and rhythm Abdomen: soft, non-tender; no masses,  no organomegaly Extremities: extremities normal, atraumatic, no cyanosis or edema Skin: Skin color, texture, turgor normal. No rashes or lesions Lymph nodes: Cervical, supraclavicular, and axillary nodes normal. No abnormal inguinal nodes palpated Neurologic: Grossly normal   Pelvic: External genitalia:  no lesions              Urethra:  Pale color and prolapse of the urethra with no masses, or lesions              Bartholin's and Skene's: normal                 Vagina: normal appearing vagina with normal  color and discharge, no lesions              Cervix: anteverted              Pap taken: no Bimanual Exam:  Uterus:  normal size, contour, position, consistency, mobility, non-tender              Adnexa: no mass, fullness, tenderness               Rectovaginal: Confirms               Anus:  normal sphincter tone, no lesions  A:  Well Woman with normal exam  Osteoporosis on Prolia per PCP  Recent atraumatic fracture of left femur with pinning 07/19/12  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  She will also try a wedge in left shoe since her gait is off and left leg is shorter. return annually or prn  An After Visit Summary was printed and given to the patient.

## 2012-11-28 NOTE — Patient Instructions (Signed)

## 2012-12-02 NOTE — Progress Notes (Signed)
Encounter reviewed by Dr. Brook Silva.  

## 2013-03-29 ENCOUNTER — Ambulatory Visit: Payer: Medicare Other | Admitting: Family Medicine

## 2013-04-11 ENCOUNTER — Ambulatory Visit (INDEPENDENT_AMBULATORY_CARE_PROVIDER_SITE_OTHER): Payer: Medicare Other | Admitting: Sports Medicine

## 2013-04-11 ENCOUNTER — Encounter: Payer: Self-pay | Admitting: Sports Medicine

## 2013-04-11 VITALS — BP 111/68 | Ht 63.0 in | Wt 128.0 lb

## 2013-04-11 DIAGNOSIS — M2170 Unequal limb length (acquired), unspecified site: Secondary | ICD-10-CM | POA: Insufficient documentation

## 2013-04-11 NOTE — Progress Notes (Signed)
Subjective:    Patient ID: [name], female    DOB: 06/16/1934, 79 y.o.   MRN: [id number]  HPI: [name] is a 79yo female who presents to clinic for about 1 year of left leg "problems." She had a left femur stress fracture that completely fractured while on vacation in May 2014, which was repaired there by Dr. Jimmye Norman in [place] and pt subsequently had about 40 days of rehab at [name] here in [place]. Since that time, pt has noticed she walks with a limp and occasionally has pain in her anterior and lateral thigh / hip region which is typically worse with cold weather and improves with Tylenol. Her biggest concern is that she thinks her left leg is shorter than the other; she has seen Dr. Wynelle Link at [facility] for this issue most recently in January and was prescribed a heel lift, but has not filled it. She states she wants to have her leg "definitely measured" and "to be sure that's the problem and not something else." Otherwise, she feels well and denies fever / chills, skin rash, swelling in her hip or leg, or numbness/weakness/paresthesias in her legs or feet.  Past Medical History  Diagnosis Date  . Paroxysmal supraventricular tachycardia   . Other chronic pulmonary heart diseases   . Other and unspecified hyperlipidemia   . Other symptoms involving cardiovascular system   . Personal history of other diseases of digestive system   . Sarcoidosis   . Esophageal reflux   . Allergy     SEASONAL  . Arthritis   . Osteoporosis   . HTN (hypertension)   . Carotid bruit   . Hematochezia   . Hx of colonoscopy    Past Surgical History  Procedure Laterality Date  . Trapezium resection Right   . Carpal tunnel release Left   . Shoulder arthroscopy w/ rotator cuff repair Right   . Foot surgery Right   . Bladder surgery    . Arm surgery Right   . Colonoscopy    . Polypectomy     Family History  Problem Relation Age of Onset  . Colon cancer Mother 9  . Lung  cancer Father   . Heart disease Father    History   Social History  . Marital Status: Widowed    Spouse Name: N/A    Number of Children: 1  . Years of Education: N/A   Occupational History  . retired     still works as Oceanographer  .     Social History Main Topics  . Smoking status: Never Smoker   . Smokeless tobacco: Never Used  . Alcohol Use: No  . Drug Use: No  . Sexual Activity: No   Other Topics Concern  . None   Social History Narrative  . None   Review of Systems: As above in HPI. Otherwise 12-point ROS reviewed and all negative.     Objective:   Physical Exam BP 111/68  Ht 5\' 3"  (1.6 m)  Wt 128 lb (58.06 kg)  BMI 22.68 kg/m2 Gen: elderly adult female in NAD, very pleasant Pulm: normal WOB, speaks in full sentences MSK:   Obvious leg length discrepancy, left shorter than right, with pt lying supine with legs fully extended  Inspection also notable for mild bilateral knee effusions but no tenderness to palpation   No tenderness to palpation over either hip or either knee or in lumbar spine / pelvis  Exhibits reduced internal rotation more than  external rotation of left hip  Also has reduced hip and knee flexion, left greater than right, secondary to stiffness but no pain  Right leg measures 89 cm from ASIS to tip of medial malleolus; left leg measures 87-87.5 cm Gait: exhibits Trendelenburg gait on left with obvious harder left foot landing, but ambulates without assistance  Gait with less obvious abnormality after insertion of heel lift in left shoe (see A&P)  Neurovascular:   Alert, oriented, grossly intact sensation to bilateral LE  Strength 5/5 in bilateral LE with hip flexion / ab- and adduction, knee flexion / extension, and plantar- / dorsiflexion  Feet warm to touch, DP pulses palpable / symmetric in bilateral feet  Skin: bilateral LE with some mild chronic venous stasis changes, but no frank rashes / ulcerations     Assessment & Plan:  Pt  definitely has a leg length discrepancy, grossly to visualization and with measurement of left 1.5-2 cm shorter than the right, with secondary Trendelenburg gait. Gait improved with 7/16" heel lifts but expect pt may ultimately need custom orthotics with slightly higher heel lift on the left. Provided pt with green sport insoles and heel lifts for tennis shoe and flat shoe. Plan to follow up in 2 weeks to re-evaluate improvement and to consider whether or not pt needs higher lift or custom orthotics made.  The above was discussed in its entirety with attending Dr. Micheline Chapman. Sherri Kluver, MD PGY-2, [facility] 04/11/2013, 12:03 PM

## 2013-04-11 NOTE — Patient Instructions (Signed)
Thank you for coming in, today! It was nice to meet you. Your left leg is about 1.5-2 centimeters (just over a half of an inch) shorter than the right. Try the heel lift in your left shoe for a couple of weeks to see if it helps. Come back to see Dr. Micheline Chapman in about 2 weeks to get checked again. If this makes a good, big difference, you might eventually need a custom-molded insert for your shoe. Keep your heel lift prescription from Dr. Wynelle Link in case you need it. --Dr. Venetia Maxon

## 2013-04-23 ENCOUNTER — Ambulatory Visit: Payer: Medicare Other | Admitting: Sports Medicine

## 2013-04-29 ENCOUNTER — Ambulatory Visit
Admission: RE | Admit: 2013-04-29 | Discharge: 2013-04-29 | Disposition: A | Discharge status: Home / Self Care | Payer: Medicare Other | Source: Ambulatory Visit | Attending: Sports Medicine | Admitting: Sports Medicine

## 2013-04-29 ENCOUNTER — Encounter: Payer: Self-pay | Admitting: Sports Medicine

## 2013-04-29 ENCOUNTER — Ambulatory Visit (INDEPENDENT_AMBULATORY_CARE_PROVIDER_SITE_OTHER): Payer: Medicare Other | Admitting: Sports Medicine

## 2013-04-29 VITALS — BP 105/65 | Ht 63.0 in | Wt 128.0 lb

## 2013-04-29 DIAGNOSIS — M2170 Unequal limb length (acquired), unspecified site: Secondary | ICD-10-CM

## 2013-04-29 DIAGNOSIS — S7223XA Displaced subtrochanteric fracture of unspecified femur, initial encounter for closed fracture: Secondary | ICD-10-CM

## 2013-04-29 DIAGNOSIS — S7222XA Displaced subtrochanteric fracture of left femur, initial encounter for closed fracture: Secondary | ICD-10-CM

## 2013-04-29 NOTE — Progress Notes (Signed)
   HPI:  Kourtnee presents to follow up on her hip discomfort and leg length discrepancy. She has been wearing the heel lift given to her at her last appointment here at the [facility]. She got some benefit from it for about a day but says it then softened and did not provide as much lift. She is concerned about not being able to rotate her left leg up across her right leg so that she can tie her left shoe. She has noticed some pain in her left hip as well. She has not yet gone to [employer] to get the custom heel lifts made. She does not get pain in her left groin.  ROS: See HPI  [place]: hx left femur ORIF within last year  PHYSICAL EXAM: BP 105/65  Ht 5\' 3"  (1.6 m)  Wt 128 lb (58.06 kg)  BMI 22.68 kg/m2 Gen: NAD, pleasant, cooperative MSK: L leg ~1.5-2cm shorter than R leg. Limited internal and external rotation of left hip. Trendelenburg gait present when pt ambulates without inserts.  ASSESSMENT/PLAN:  See problem based charting for assessment/plan.  FOLLOW UP: F/u in 3 weeks for leg length discrepancy & gait abnormality.  SIGNED: Delorse Limber. Ardelia Mems, [place] Resident PGY-2 [place]

## 2013-04-29 NOTE — Assessment & Plan Note (Addendum)
As pt has had some benefit from 7/16" of heel lift, will have her go to [facility] to have custom lift made for her left shoe. Given her limited range of motion with internal & external rotation of the left hip, will also obtain plain films of her L hip and femur to evaluate fracture hardware. F/u in 3 weeks.

## 2013-04-30 ENCOUNTER — Telehealth: Payer: Self-pay | Admitting: Sports Medicine

## 2013-04-30 NOTE — Telephone Encounter (Signed)
I spoke with the patient on the phone today after reviewing x-rays of her pelvis and left hip. 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 This is the reason for her restricted motion in her left hip. The medullary rod from her ORIF is in place but the distal fixation screw appears to be posterior to the medullary rod. This is likely an incidental finding. Patient has a followup appointment with me in 3 weeks. In the meantime, I've recommended that she go ahead and get her half inch lift for her left shoe as ordered by Dr. Sharion Dove. I've also recommended that she followup with Dr.Alussio at some point to discuss possible treatment options for her left hip.

## 2013-05-15 ENCOUNTER — Other Ambulatory Visit: Payer: Self-pay

## 2013-05-15 DIAGNOSIS — Z1231 Encounter for screening mammogram for malignant neoplasm of breast: Secondary | ICD-10-CM

## 2013-05-20 ENCOUNTER — Ambulatory Visit (INDEPENDENT_AMBULATORY_CARE_PROVIDER_SITE_OTHER): Payer: Medicare Other | Admitting: Sports Medicine

## 2013-05-20 ENCOUNTER — Encounter: Payer: Self-pay | Admitting: Sports Medicine

## 2013-05-20 VITALS — BP 133/70 | HR 61 | Ht 63.0 in | Wt 128.0 lb

## 2013-05-20 DIAGNOSIS — M2170 Unequal limb length (acquired), unspecified site: Secondary | ICD-10-CM

## 2013-05-21 NOTE — Progress Notes (Signed)
Patient ID: [name], female   DOB: 06/16/1934, 79 y.o.   MRN: [id number]  Patient comes in today for followup. Since her last office visit she did get the shoe lift for her left leg. She admits that it has helped with her hip discomfort and fatigue. Her main concern is the cosmetic appearance of the shoe. I reassured her that they did a good job of constructing her lift and that it is not noticeable. She can also use the heel lift that we gave her in other shoes if she would like. Although she is a little reluctant to wear the heel lift, she admits that it does help with her limp and pain. I also reviewed the x-ray of her left hip that we got a couple of weeks ago. No significant degenerative changes but she has quite a bit of heterotopic ossification from her previous fracture. This is what is limiting her range of motion. My advice is for the patient to continue with activity as tolerated and not get too aggressive with treatment in regards to this. Patient understands and is in agreement. At this point I will discharge her to followup with me when necessary.

## 2013-06-12 ENCOUNTER — Encounter: Payer: Self-pay | Admitting: Internal Medicine

## 2013-06-13 ENCOUNTER — Ambulatory Visit
Admission: RE | Admit: 2013-06-13 | Discharge: 2013-06-13 | Disposition: A | Discharge status: Home / Self Care | Payer: Medicare Other | Source: Ambulatory Visit

## 2013-06-13 ENCOUNTER — Encounter (INDEPENDENT_AMBULATORY_CARE_PROVIDER_SITE_OTHER): Payer: Self-pay

## 2013-06-13 DIAGNOSIS — Z1231 Encounter for screening mammogram for malignant neoplasm of breast: Secondary | ICD-10-CM

## 2013-07-09 ENCOUNTER — Encounter: Payer: Self-pay | Admitting: Internal Medicine

## 2013-07-30 ENCOUNTER — Ambulatory Visit (INDEPENDENT_AMBULATORY_CARE_PROVIDER_SITE_OTHER): Payer: Medicare Other | Admitting: Sports Medicine

## 2013-07-30 ENCOUNTER — Encounter: Payer: Self-pay | Admitting: Sports Medicine

## 2013-07-30 VITALS — BP 108/65 | Ht 63.0 in | Wt 128.0 lb

## 2013-07-30 DIAGNOSIS — M4120 Other idiopathic scoliosis, site unspecified: Secondary | ICD-10-CM | POA: Insufficient documentation

## 2013-07-30 DIAGNOSIS — M2170 Unequal limb length (acquired), unspecified site: Secondary | ICD-10-CM

## 2013-07-30 NOTE — Assessment & Plan Note (Signed)
Leg length discrepancy likely due to surgical repair of left leg fracture along with kyphoscoliosis. Hip inflexibility likely secondary to surgery with rod fixation of a fracture, and subsequent osteoarthritis. - Prescribed back exercises (shoulder extensions, spine side bends with arms extended, spine rotation with arms extended) with 1 lb weight in each hand, and shoulder abduction stretch. - Return for orthotic visit; bring new tennis shoes and other shoes to place felt inserts. - In the meantime, continue wearing tennis shoes with provide good correction for leg length discrepancy. - Hip flexibility and strengthening exercises prescribed: straight leg raises, knee to chest, and external rotation of hip.

## 2013-07-30 NOTE — Progress Notes (Signed)
Subjective:   CC: Left leg length discrepancy  HPI:   Patient is a 79 y.o. female with h/o left femur fracture ~1 year ago with subsequent surgical repair with rod fixation. She states left leg has been shorter since surgery and she had her shoe raised by [employer]. She has worn these tennis shoes with improvement in gait but when she has to wear another shoe she has no orthotic for it. She recently wore sandals for her grandson's wedding and was fatigued at the end of the day. She would like orthotics that she can place into another shoe.   Review of Systems - Per HPI.   PMH: Left femur fracture and surgical repair with rod placement ~May 2014.  Sent to [place] by her daughter    Objective:  Physical Exam BP 108/65  Ht 5\' 3"  (1.6 m)  Wt 128 lb (58.06 kg)  BMI 22.68 kg/m2 GEN: NAD, pleasant EXTR:  Left leg ~2cm shorter than right at medial malleoli and knee; left ASIS mildly higher than right ~1/2 cm Hip flexion 4/5 bilaterally Hip abduction 5/5 bilaterally Right hip with 70 degrees of rotation; left hip with 45 degrees of rotation Normal hip stability  Tight on FABER bilat  Bilateral knees with chronic deformity/OA changes Left knee with extension limited by 10 degrees Back with moderate kyphosis and mild scoliosis; on forward bend, left rib cage ~1cm higher than right Gait shows trunk rotation and  with moderate left Trendelenburg    Assessment:     LEGEND PECORE is a 79 y.o. female with h/o left femur fracture and surgical repair with rod ~1 year ago here for left leg length discrepancy.    Plan:     Left leg length discrepancy, ~2cm shorter than right Kyphoscoliosis Bilateral knee osteoarthritis H/o left femur fracture s/p surgical repair with rod placement Leg length discrepancy likely due to surgical repair of left leg fracture along with kyphoscoliosis. Hip inflexibility likely secondary to surgery with rod fixation of a fracture, and subsequent osteoarthritis. -  Prescribed back exercises (shoulder extensions, spine side bends with arms extended, spine rotation with arms extended) with 1 lb weight in each hand, and shoulder abduction stretch. - Return for orthotic visit; bring new tennis shoes and other shoes to place felt inserts. - In the meantime, continue wearing tennis shoes with provide good correction for leg length discrepancy. - Hip flexibility and strengthening exercises prescribed: straight leg raises, knee to chest, and external rotation of hip.    Hilton Sinclair, MD [place]

## 2013-07-30 NOTE — Assessment & Plan Note (Signed)
Given a series of exercises to lessen the soft tissue changes related to her kyphoscoliosis  We should work on those and help her improve her gait  C. patient instructions

## 2013-07-30 NOTE — Patient Instructions (Signed)
Good to see you today.  For your back, you have kyphoscoliosis, which just means your spine is bending forward and to the side due to your osteoporosis.  Do these exercises daily with 1 lb weight in each hand: - External fly: Lift arms in front of you and pull back to bend each elbow to 90 degrees (try to make shoulder blades touch). - Keep arms extended out to each side and lean side to side like a plane. - Keep arms extended out to each side and rotate at the hip so your left arm is in front of you and right arm is behind you, then rotate 180 degrees the other way. - lay somewhere flat and raise arms as high above your head as you can to stretch out back.  For your left leg, it is shorter than the right. - Continue wearing current tennis shoes. - Buy a new tennis shoe that is comfortable for you. [employer] or [employer] can help you pick a good shoe for your foot type. - Make an appointment with Dr Oneida Alar for orthotics. Bring your new shoes and a few of your most commonly worn other shoes so we can work with these.  For your hip flexibility, - do straight leg raises. - Do knee to chest exercises. - Work on externally rotating the hips by pulling L knee to chest, resting L foot on R thigh just above knee, and gently pushing down on L knee.  Repeat on right side.  Do these exercises 2-3 times daily and follow up with Dr Oneida Alar for orthotics appointment.

## 2013-08-19 ENCOUNTER — Ambulatory Visit (AMBULATORY_SURGERY_CENTER): Payer: Self-pay

## 2013-08-19 VITALS — Ht 63.5 in | Wt 129.0 lb

## 2013-08-19 DIAGNOSIS — Z860100 Personal history of colon polyps, unspecified: Secondary | ICD-10-CM

## 2013-08-19 MED ORDER — MOVIPREP 100 G PO SOLR: 1.0000 | Freq: Once | ORAL | Status: DC

## 2013-08-19 NOTE — Progress Notes (Signed)
No allergies to eggs or soy No past problems with anesthesia No diet/weight loss meds No home oxygen  Has email Emmi instructions given for colono

## 2013-09-02 ENCOUNTER — Encounter: Payer: Medicare Other | Admitting: Internal Medicine

## 2013-09-04 ENCOUNTER — Telehealth: Payer: Self-pay | Admitting: Internal Medicine

## 2013-09-04 NOTE — Telephone Encounter (Signed)
Left message that pt does not have to stop meds

## 2013-09-05 ENCOUNTER — Ambulatory Visit (INDEPENDENT_AMBULATORY_CARE_PROVIDER_SITE_OTHER): Payer: Medicare Other | Admitting: Sports Medicine

## 2013-09-05 ENCOUNTER — Encounter: Payer: Self-pay | Admitting: Sports Medicine

## 2013-09-05 VITALS — BP 116/57 | HR 66

## 2013-09-05 DIAGNOSIS — M4120 Other idiopathic scoliosis, site unspecified: Secondary | ICD-10-CM

## 2013-09-05 DIAGNOSIS — M2170 Unequal limb length (acquired), unspecified site: Secondary | ICD-10-CM

## 2013-09-05 NOTE — Assessment & Plan Note (Addendum)
Chronic condition following left Femur Fx with IM rod placement  - She has previously been fitted with a midsole lift for her left shoe. Has had no further correction ifor her longitudinal or transverse arches.  Shoe modifications made today:  - primary modification - left foot: 3/16 felt lift, small metatarsal pad, right foot: Lateral post and scaphoid pad - Additional metatarsal pads added to her previously modified [employer] shoes.  - Sandals - 3/16 heel lift on left - dress shoes - 3/16 lift on left with small metatarsal pad  Shoes "Bunny Ear" lacing was changed for athletic shoes to help decrease heel slip  She'll continue with the hip girdle strengthening exercises provided last time and add standing hip ROM exercises reviewed today She will follow up in 2 months to have custom permamant cushioned orthotics constructed if she tolerates this well

## 2013-09-05 NOTE — Patient Instructions (Signed)
Remember your homework for your back and your hip

## 2013-09-05 NOTE — Progress Notes (Signed)
  [name] - 79 y.o. female MRN [id number]  Date of birth: Jun 19, 1934  SUBJECTIVE:  Including CC & ROS.  The patient following up for: left hip pain  and consideration of orthotic instruction: patient reports continued fatigue when her feet for prolonged period but better with her modified shoe for my attack. She has been continuing to have some bilateral foot pain as well as not been doing anything new or different for this. She does continue to have some left-sided leg pain and weakness.   HISTORY: Past Medical, Surgical, Social, and Family History Reviewed & Updated per EMR. Pertinent Historical Findings include: Left femur fracture approximately year ago with subsequent short left leg following IM rod. Prior hammertoe surgery on the right fourth toe with significant angulation following this. Acquired scoliosis with kyphoscoliosis   DATA REVIEWED: Prior office visits including x-rays  PHYSICAL EXAM:  VS: BP:116/57 mmHg  HR:66bpm  TEMP: ( )  RESP:   HT:    WT:   BMI:  PHYSICAL EXAM: GENERAL:  Elderly Caucasian  female. In no discomfort; no respiratory distress  PSYCH:  alert and appropriate, good insight  Walking Gait Exam:              General:  Trendelenberg gait  Strike Pattern:  Wide based    Foot Pattern:  Neutral heel strike Foot Exam:        Gen/Palp:  R 4th  Toe with 90+ lateral angulation at the PIP. Bilateral Morton's foot with Morton's callus                 Tests:  Capillary refill less than 2 seconds. Pulses 2+/4 bilaterally. Msk Exam:        Gen/Palp:  General is tender to palpation over the sacroiliac joints and left greater trochanter. No focal tenderness. ~2cm short left leg.                   ROM:  Internal rotation of left hip 0 only.                     NV:  Lower extremity myotomes 5+/5; dematomes intact to light touch  ASSESSMENT & PLAN: See problem based charting & AVS for pt instructions.

## 2013-09-06 NOTE — Assessment & Plan Note (Signed)
Problem Based Documentation:    Subjective Report:  She reports overall improved mid back pain.  Compliant with her exercises     Assessment & Plan & Follow up Issues:  Chronic condition  -  1. Encouraged to continue thoracic strengthening exercises including shoulder strengthening and scapular retraction

## 2013-09-18 ENCOUNTER — Ambulatory Visit (AMBULATORY_SURGERY_CENTER): Payer: Medicare Other | Admitting: Internal Medicine

## 2013-09-18 ENCOUNTER — Encounter: Payer: Self-pay | Admitting: Internal Medicine

## 2013-09-18 VITALS — BP 144/68 | HR 82 | Temp 97.1°F | Resp 24 | Ht 63.5 in | Wt 129.0 lb

## 2013-09-18 DIAGNOSIS — D126 Benign neoplasm of colon, unspecified: Secondary | ICD-10-CM

## 2013-09-18 DIAGNOSIS — Z86010 Personal history of colonic polyps: Secondary | ICD-10-CM

## 2013-09-18 DIAGNOSIS — D122 Benign neoplasm of ascending colon: Secondary | ICD-10-CM

## 2013-09-18 MED ORDER — SODIUM CHLORIDE 0.9 % IV SOLN
500.0000 mL | INTRAVENOUS | Status: DC
Start: 2013-09-18 — End: 2013-09-18

## 2013-09-18 NOTE — Progress Notes (Signed)
Called to room to assist during endoscopic procedure.  Patient ID and intended procedure confirmed with present staff. Received instructions for my participation in the procedure from the performing physician.  

## 2013-09-18 NOTE — Patient Instructions (Signed)
YOU HAD AN ENDOSCOPIC PROCEDURE TODAY AT THE [facility]: Refer to the procedure report that was given to you for any specific questions about what was found during the examination.  If the procedure report does not answer your questions, please call your gastroenterologist to clarify.  If you requested that your care partner not be given the details of your procedure findings, then the procedure report has been included in a sealed envelope for you to review at your convenience later.  YOU SHOULD EXPECT: Some feelings of bloating in the abdomen. Passage of more gas than usual.  Walking can help get rid of the air that was put into your GI tract during the procedure and reduce the bloating. If you had a lower endoscopy (such as a colonoscopy or flexible sigmoidoscopy) you may notice spotting of blood in your stool or on the toilet paper. If you underwent a bowel prep for your procedure, then you may not have a normal bowel movement for a few days.  DIET: Your first meal following the procedure should be a light meal and then it is ok to progress to your normal diet.  A half-sandwich or bowl of soup is an example of a good first meal.  Heavy or fried foods are harder to digest and may make you feel nauseous or bloated.  Likewise meals heavy in dairy and vegetables can cause extra gas to form and this can also increase the bloating.  Drink plenty of fluids but you should avoid alcoholic beverages for 24 hours.  ACTIVITY: Your care partner should take you home directly after the procedure.  You should plan to take it easy, moving slowly for the rest of the day.  You can resume normal activity the day after the procedure however you should NOT DRIVE or use heavy machinery for 24 hours (because of the sedation medicines used during the test).    SYMPTOMS TO REPORT IMMEDIATELY: A gastroenterologist can be reached at any hour.  During normal business hours, 8:30 AM to 5:00 PM Monday through Friday,  call [phone].  After hours and on weekends, please call the GI answering service at [phone] who will take a message and have the physician on call contact you.   Following lower endoscopy (colonoscopy or flexible sigmoidoscopy):  Excessive amounts of blood in the stool  Significant tenderness or worsening of abdominal pains  Swelling of the abdomen that is new, acute  Fever of 100F or higher    FOLLOW UP: If any biopsies were taken you will be contacted by phone or by letter within the next 1-3 weeks.  Call your gastroenterologist if you have not heard about the biopsies in 3 weeks.  Our staff will call the home number listed on your records the next business day following your procedure to check on you and address any questions or concerns that you may have at that time regarding the information given to you following your procedure. This is a courtesy call and so if there is no answer at the home number and we have not heard from you through the emergency physician on call, we will assume that you have returned to your regular daily activities without incident.  SIGNATURES/CONFIDENTIALITY: You and/or your care partner have signed paperwork which will be entered into your electronic medical record.  These signatures attest to the fact that that the information above on your After Visit Summary has been reviewed and is understood.  Full responsibility of the confidentiality   information lies with you and/or your care-partner.    Handout was given to your care partner on polyps. You may resume your current medications today. Await biopsy results. Please call if any questions or concerns.   

## 2013-09-18 NOTE — Progress Notes (Signed)
A/ox3 pleased with MAC, report to Annette RN 

## 2013-09-18 NOTE — Op Note (Signed)
[facility]  [address] [place], [id number]   COLONOSCOPY PROCEDURE REPORT  PATIENT: [name], [name]  MR#: [id number] BIRTHDATE: 17-Oct-1934 , 79  yrs. old GENDER: Female ENDOSCOPIST: Eustace Quail, MD REFERRED CB:[id number] Program Recall PROCEDURE DATE:  09/18/2013 PROCEDURE:   Colonoscopy with snare polypectomy x1 First Screening Colonoscopy - Avg.  risk and is 50 yrs.  old or older - No.  Prior Negative Screening - Now for repeat screening. N/A  History of Adenoma - Now for follow-up colonoscopy & has been > or = to 3 yrs.  Yes hx of adenoma.  Has been 3 or more years since last colonoscopy.  Polyps Removed Today? Yes. ASA CLASS:   Class II INDICATIONS:Patient's immediate family history of colon cancer (parent 60s)and Patient's personal history of adenomatous colon polyps. Multiple prior exams. Last exam in June 2010. MEDICATIONS: MAC sedation, administered by CRNA and propofol (Diprivan) 200mg  IV  DESCRIPTION OF PROCEDURE:   After the risks benefits and alternatives of the procedure were thoroughly explained, informed consent was obtained.  A digital rectal exam revealed no abnormalities of the rectum.   The LB [id number]  endoscope was introduced through the anus and advanced to the cecum, which was identified by both the appendix and ileocecal valve. No adverse events experienced.   The quality of the prep was good, using MoviPrep  The instrument was then slowly withdrawn as the colon was fully examined.      COLON FINDINGS: A diminutive polyp was found in the ascending colon. A polypectomy was performed with a cold snare.  The resection was complete and the polyp tissue was completely retrieved.   The colon mucosa was otherwise normal.  Retroflexed views revealed no abnormalities. The time to cecum=2 minutes 20 seconds.  Withdrawal time=12 minutes 54 seconds.  The scope was withdrawn and the procedure completed. COMPLICATIONS: There  were no complications.  ENDOSCOPIC IMPRESSION: 1.   Diminutive polyp was found in the ascending colon; polypectomy was performed with a cold snare 2.   The colon mucosa was otherwise normal  RECOMMENDATIONS: 1. Return to the care of your primary provider.  GI follow up as needed   eSigned:  Eustace Quail, MD 09/18/2013 11:23 AM   cc: [name], MD and The Patient

## 2013-09-18 NOTE — Progress Notes (Signed)
No complaints noted in the recovery room. Maw   

## 2013-09-19 ENCOUNTER — Telehealth: Payer: Self-pay | Admitting: Internal Medicine

## 2013-09-19 ENCOUNTER — Telehealth: Payer: Self-pay | Admitting: *Deleted

## 2013-09-19 NOTE — Telephone Encounter (Signed)
  Follow up Call-  Call back number 09/18/2013  Post procedure Call Back phone  # [phone]  Permission to leave phone message Yes     Patient questions:  Do you have a fever, pain , or abdominal swelling? No. Pain Score  0 *  Have you tolerated food without any problems? Yes.    Have you been able to return to your normal activities? Yes.    Do you have any questions about your discharge instructions: Diet   No. Medications  No. Follow up visit  No.  Do you have questions or concerns about your Care? No.  Actions: * If pain score is 4 or above: No action needed, pain <4.

## 2013-09-19 NOTE — Telephone Encounter (Signed)
Pt states she is having terrible gas pains and would like to know what to take. Pt instructed to try some gas-x or phazyme OTC. Pt verbalized understanding.

## 2013-09-23 ENCOUNTER — Encounter: Payer: Self-pay | Admitting: Internal Medicine

## 2013-11-06 ENCOUNTER — Encounter: Payer: Self-pay | Admitting: Sports Medicine

## 2013-11-06 ENCOUNTER — Ambulatory Visit (INDEPENDENT_AMBULATORY_CARE_PROVIDER_SITE_OTHER): Payer: Medicare Other | Admitting: Sports Medicine

## 2013-11-06 VITALS — BP 118/45 | HR 60 | Ht 63.0 in | Wt 128.0 lb

## 2013-11-06 DIAGNOSIS — M2170 Unequal limb length (acquired), unspecified site: Secondary | ICD-10-CM

## 2013-11-06 DIAGNOSIS — R269 Unspecified abnormalities of gait and mobility: Secondary | ICD-10-CM

## 2013-11-06 NOTE — Assessment & Plan Note (Signed)
Patient was fitted for a : standard, cushioned, semi-rigid orthotic. The orthotic was heated and afterward the patient stood on the orthotic blank positioned on the orthotic stand. The patient was positioned in subtalar neutral position and 10 degrees of ankle dorsiflexion in a weight bearing stance. After completion of molding, a stable base was applied to the orthotic blank. The blank was ground to a stable position for weight bearing. Size:8 red EVA Base: Blue Eva medium Posting:leg length correction EVA pad left Additional orthotic padding:hammer toe pad  Preparation and analysis face to face 45 mins  Gait is more comforatable with less trendelenburg at completion  Recheck in 3 mos

## 2013-11-06 NOTE — Progress Notes (Signed)
Patient ID: [name], female   DOB: March 06, 1934, 79 y.o.   MRN: [id number]  Patient returns for problems with back pain and hip and leg pain She has a history of a left subtrochanteric femur fracture This had an internal medullary fixation She was left with a short leg  Since that time as she tried to walk because it created back , hip and pelvic pain  On her last visit we gave her a lift for the left foot We gave her a hammertoe crest We gave her some exercises to work on back muscles and rotation  She says she has much less pain She has actually joined the [facility] to start an exercise program  Blue to the left she feels that it's helping but not as much support as she needs  Examination No acute distress, pleasant elderly female BP 118/45  Pulse 60  Ht 5\' 3"  (1.6 m)  Wt 128 lb (58.06 kg)  BMI 22.68 kg/m2  Note that she has a significant Trendelenburg gait with drop to the left even when we have the lift in place  Foot shows loss of longitudinal arch bilaterally Significant pronation on the right with some subluxation at the ankle Right fourth PIP is dislocated 90 laterally Morton's callus bilaterally  Left foot has loss of longitudinal arch There are hammertoes 2, 3 and 4

## 2013-11-06 NOTE — Assessment & Plan Note (Signed)
Measurement today appears at least 2 and possibly 3 cm difference  Extra EVA base added to left

## 2013-11-08 ENCOUNTER — Other Ambulatory Visit (HOSPITAL_COMMUNITY): Payer: Self-pay | Admitting: *Deleted

## 2013-11-08 DIAGNOSIS — I6529 Occlusion and stenosis of unspecified carotid artery: Secondary | ICD-10-CM

## 2013-11-18 ENCOUNTER — Ambulatory Visit (HOSPITAL_COMMUNITY): Payer: Medicare Other | Attending: Internal Medicine | Admitting: Cardiology

## 2013-11-18 DIAGNOSIS — J449 Chronic obstructive pulmonary disease, unspecified: Secondary | ICD-10-CM | POA: Diagnosis not present

## 2013-11-18 DIAGNOSIS — J4489 Other specified chronic obstructive pulmonary disease: Secondary | ICD-10-CM | POA: Insufficient documentation

## 2013-11-18 DIAGNOSIS — E785 Hyperlipidemia, unspecified: Secondary | ICD-10-CM | POA: Insufficient documentation

## 2013-11-18 DIAGNOSIS — I6529 Occlusion and stenosis of unspecified carotid artery: Secondary | ICD-10-CM | POA: Diagnosis not present

## 2013-11-18 NOTE — Progress Notes (Signed)
Carotid duplex performed 

## 2013-11-21 ENCOUNTER — Encounter: Payer: Self-pay | Admitting: Podiatry

## 2013-11-21 ENCOUNTER — Ambulatory Visit: Payer: Medicare Other | Admitting: Podiatry

## 2013-11-21 VITALS — BP 137/60 | HR 64 | Resp 16

## 2013-11-21 DIAGNOSIS — L600 Ingrowing nail: Secondary | ICD-10-CM

## 2013-11-21 DIAGNOSIS — I6529 Occlusion and stenosis of unspecified carotid artery: Secondary | ICD-10-CM

## 2013-11-21 NOTE — Patient Instructions (Signed)
Long Term Care Instructions-Post Nail Surgery  You have had your ingrown toenail and root treated with a chemical.  This chemical causes a burn that will drain and ooze like a blister.  This can drain for 6-8 weeks or longer.  It is important to keep this area clean, covered, and follow the soaking instructions dispensed at the time of your surgery.  This area will eventually dry and form a scab.  Once the scab forms you no longer need to soak or apply a dressing.  If at any time you experience an increase in pain, redness, swelling, or drainage, you should contact the office as soon as possible.Betadine Soak Instructions  Purchase an 8 oz. bottle of BETADINE solution (Povidone)  THE DAY AFTER THE PROCEDURE  Place 1 tablespoon of betadine solution in a quart of warm tap water.  Submerge your foot or feet with outer bandage intact for the initial soak; this will allow the bandage to become moist and wet for easy lift off.  Once you remove your bandage, continue to soak in the solution for 20 minutes.  This soak should be done twice a day.  Next, remove your foot or feet from solution, blot dry the affected area and cover.  You may use a band aid large enough to cover the area or use gauze and tape.  Apply other medications to the area as directed by the doctor such as cortisporin otic solution (ear drops) or neosporin.  IF YOUR SKIN BECOMES IRRITATED WHILE USING THESE INSTRUCTIONS, IT IS OKAY TO SWITCH TO EPSOM SALTS AND WATER OR WHITE VINEGAR AND WATER. 

## 2013-11-22 NOTE — Progress Notes (Signed)
Subjective:     Patient ID: [name], female   DOB: 04-21-1934, 79 y.o.   MRN: [id number]  HPI patient presents stating my left big toenail has been ingrown and sore and my feet in general can become tender   Review of Systems  All other systems reviewed and are negative.      Objective:   Physical Exam  Nursing note and vitals reviewed. Constitutional: She is oriented to person, place, and time.  Cardiovascular: Intact distal pulses.   Musculoskeletal: Normal range of motion.  Neurological: She is oriented to person, place, and time.  Skin: Skin is warm.   neurovascular status is intact with muscle strength adequate and range of motion of the subtalar and midtarsal joint. Patient's noted to have digital correction from several years ago right foot with adequate alignment and is noted to have incurvated nailbed left hallux medial lateral border. Patient is found to have good digital perfusion and is well oriented x3     Assessment:     Chronic foot structural issues and ingrown toenail both medial and lateral border of the left hallux    Plan:     H&P and conditions discussed. Advised on supportive shoe gear and today discussed correction of the ingrown discussing the procedure and risk. Patient wants surgery and today I infiltrated 60 mg Xylocaine Marcaine mixture and after sterile prep remove the medial and lateral border of the left hallux exposed matrix and applied phenol 3 applications 30 seconds to each border followed by alcohol lavaged and sterile dressing. Gave instructions on soaks and reappoint

## 2013-11-27 ENCOUNTER — Other Ambulatory Visit (HOSPITAL_COMMUNITY): Payer: Self-pay | Admitting: Respiratory Therapy

## 2013-11-27 DIAGNOSIS — R0600 Dyspnea, unspecified: Secondary | ICD-10-CM

## 2013-11-28 ENCOUNTER — Encounter: Payer: Self-pay | Admitting: Physician Assistant

## 2013-11-28 ENCOUNTER — Ambulatory Visit (INDEPENDENT_AMBULATORY_CARE_PROVIDER_SITE_OTHER): Payer: Medicare Other | Admitting: Physician Assistant

## 2013-11-28 VITALS — BP 130/60 | HR 64 | Ht 63.0 in | Wt 133.0 lb

## 2013-11-28 DIAGNOSIS — I6529 Occlusion and stenosis of unspecified carotid artery: Secondary | ICD-10-CM

## 2013-11-28 DIAGNOSIS — R0602 Shortness of breath: Secondary | ICD-10-CM

## 2013-11-28 NOTE — Progress Notes (Signed)
Cardiology Office Note    Date:  11/28/2013   ID:  [name], DOB 09-22-1934, MRN [id number]  PCP:  [name], MD  Cardiologist: Dr. Aundra Dubin     History of Present Illness: [name] is a 79 y.o. female pulmonary sarcoidosis, HLD, HTN, SVT and R carotid bruit w/ minimal carotid stenosis who presents for followup.   She has been followed by cardiology because of a history of SVT. She takes diltiazem CD for this. She has been doing well from this perspective for quite some time. No tachypalpitations. She has chronic DOE with fast walking which has been present for years. However, it has progressively become worse and now to the point that bringing in the groceries bothers her. No chest pain. She had a fall in 6/14 with a femur fracture requiring repair. However, does not need to walk with cane any longer and is getting much stronger. She has had more fatigue since her hip fracture. Otherwise; no lightheadedness, syncope, LE swelling, orthopnea, PND or blood in her stool or urine. Cannot lie flat at night due to back pain. Recently on prednisone for this. She was diagnosed with pulmonary sarcoidosis in the 1990s and was followed at [facility], Dr. Lucia Gaskins. She has been since lost to follow up. She has recently complained of worsening SOB and PCP ordered CXRs and PFTs.   Studies:  - Echo (10/10) with EF 60-65%, mild MR  - Carotid US (9/13) with minimal carotid stenosis   Recent Labs/Images:  No results found for this basename: NA, K, BUN, CREATININE, ALT, HGB, TSH, LDL, LDLCALC, LDLDIRECT, HDL, BNP, PROBNP,  in the last 8760 hours   Mm Screening Breast Tomo Bilateral  06/18/2013   CLINICAL DATA:  Screening.  EXAM: DIGITAL SCREENING BILATERAL MAMMOGRAM WITH 3D TOMO WITH CAD  COMPARISON:  Previous exam(s).  ACR Breast Density Category c: The breast tissue is heterogeneously dense, which may obscure small masses.  FINDINGS: There are no findings suspicious for malignancy.  Images were processed with CAD.  IMPRESSION: No mammographic evidence of malignancy. A result letter of this screening mammogram will be mailed directly to the patient.  RECOMMENDATION: Screening mammogram in one year. (Code:[id number]-B-01Y)  BI-RADS CATEGORY  1: Negative.   Electronically Signed   By: Willadean Carol M.D.   On: 06/18/2013 16:33     Wt Readings from Last 3 Encounters:  11/28/13 133 lb (60.328 kg)  11/06/13 128 lb (58.06 kg)  09/18/13 129 lb (58.514 kg)     Past Medical History  Diagnosis Date  . Paroxysmal supraventricular tachycardia   . Other chronic pulmonary heart diseases   . Other and unspecified hyperlipidemia   . Other symptoms involving cardiovascular system   . Personal history of other diseases of digestive system   . Sarcoidosis   . Esophageal reflux   . Allergy     SEASONAL  . Arthritis   . Osteoporosis   . HTN (hypertension)   . Carotid bruit   . Hematochezia   . Hx of colonoscopy     Current Outpatient Prescriptions  Medication Sig Dispense Refill  . diltiazem (CARDIZEM CD) 180 MG 24 hr capsule Take 180 mg by mouth daily.        Marland Kitchen esomeprazole (NEXIUM) 40 MG capsule Take 40 mg by mouth daily before breakfast.        . meloxicam (MOBIC) 15 MG tablet Take 15 mg by mouth as needed.       Marland Kitchen omeprazole (PRILOSEC) 40  MG capsule Take 40 mg by mouth daily.       . pravastatin (PRAVACHOL) 40 MG tablet Take 40 mg by mouth daily.      . traMADol (ULTRAM) 50 MG tablet Take 50 mg by mouth every 6 (six) hours as needed.       . Vitamin D, Ergocalciferol, (DRISDOL) 50000 UNITS CAPS Take 50,000 Units by mouth every 7 (seven) days.        No current facility-administered medications for this visit.     Allergies:   Morphine; Oxycodone-acetaminophen; and Sulfonamide derivatives   Social History:  The patient  reports that she has never smoked. She has never used smokeless tobacco. She reports that she does not drink alcohol or use illicit drugs.   Family History:   The patient's family history includes Arrhythmia in her father; Cancer in her father and mother; Colon cancer (age of onset: 72) in her mother; Heart disease in her father; Lung cancer in her father. There is no history of Esophageal cancer, Rectal cancer, Stomach cancer, Heart attack, or Stroke.   ROS:  Please see the history of present illness.  All other systems reviewed and negative.    PHYSICAL EXAM: VS:  BP 130/60  Pulse 64  Ht 5\' 3"  (1.6 m)  Wt 133 lb (60.328 kg)  BMI 23.57 kg/m2 Well nourished, well developed, in no acute distress HEENT: normal Neck: no JVD Cardiac:  normal S1, S2; RRR; no murmur Lungs:  clear to auscultation bilaterally, no wheezing, rhonchi or rales Abd: soft, nontender, no hepatomegaly Ext: no edema Skin: warm and dry Neuro:  CNs 2-12 intact, no focal abnormalities noted  EKG:  NSR HR 64      ASSESSMENT AND PLAN: [name] is a 79 y.o. female pulmonary sarcoidosis, HLD, HTN, SVT and carotid bruits w/ minimal carotid stenosis who presents for followup.   Hx of SVT- no obvious reoccurance.  -- Continue Diltiazem SR 24 h 180mg  po qd.   Worsening DOE- has gotten progressively worse. Now SOB when bringing in groceries.   -- Has a hx of pulmonary sarcoidosis. PCP has ordered PFTs and CXRs -- Will order a repeat ECHO ( last in 2010) and lexiscan myoview ( cannot walk on treadmill) to rule out an ischemic cause of her worsening SOB. She does have RFs including age, HTN, HLD and carotid artery disease.   HLD- Continue Pravachol. Under the care of Dr. Dagmar Hait, her PCP  HTN-  Well controlled: 130/60 mg HG.  -- Continue Diltiazem SR 24 h 180mg  po qd.    MR- mild by ECHO in 2010 -- No murmur. Will repeat ECHO with new SOB  Carotid bruit on R- Carotid US (9/13) with minimal carotid stenosis. Repeat 11/20/13 with no change.  -- F/U in 2 years  Pulmonary sarcoidosis (diagnosed in the 90s). Was followed at [facility], Dr. Lucia Gaskins. Lost to follow up -- Now  follows with PCP. Consider referral back to pulmonolgy  Disposition:  FU with Dr. Aundra Dubin in 6-8 weeks.    Signed, Vesta Mixer, PA-C, M[facility] 11/28/2013 12:02 PM    [address], [place], [place]  [address] Phone: [phone]; Fax: [phone]

## 2013-11-28 NOTE — Patient Instructions (Signed)
Your physician has requested that you have an echocardiogram. Echocardiography is a painless test that uses sound waves to create images of your heart. It provides your doctor with information about the size and shape of your heart and how well your heart's chambers and valves are working. This procedure takes approximately one hour. There are no restrictions for this procedure.  Your physician has requested that you have a lexiscan myoview. For further information please visit [email or web address]. Please follow instruction sheet, as given.  Your physician recommends that you schedule a follow-up appointment in: [place]. Aundra Dubin

## 2013-12-02 ENCOUNTER — Ambulatory Visit (INDEPENDENT_AMBULATORY_CARE_PROVIDER_SITE_OTHER): Payer: Medicare Other | Admitting: Nurse Practitioner

## 2013-12-02 ENCOUNTER — Encounter: Payer: Self-pay | Admitting: Nurse Practitioner

## 2013-12-02 VITALS — BP 128/58 | HR 72 | Ht 63.0 in | Wt 133.0 lb

## 2013-12-02 DIAGNOSIS — Z124 Encounter for screening for malignant neoplasm of cervix: Secondary | ICD-10-CM

## 2013-12-02 DIAGNOSIS — R3915 Urgency of urination: Secondary | ICD-10-CM

## 2013-12-02 DIAGNOSIS — Z01419 Encounter for gynecological examination (general) (routine) without abnormal findings: Secondary | ICD-10-CM

## 2013-12-02 DIAGNOSIS — Z0000 Encounter for general adult medical examination without abnormal findings: Secondary | ICD-10-CM

## 2013-12-02 LAB — POCT URINALYSIS DIPSTICK
Bilirubin, UA: NEGATIVE
Glucose, UA: NEGATIVE
Ketones, UA: NEGATIVE
Leukocytes, UA: NEGATIVE
Nitrite, UA: NEGATIVE
PROTEIN UA: NEGATIVE
RBC UA: NEGATIVE
Urobilinogen, UA: NEGATIVE
pH, UA: 5

## 2013-12-02 NOTE — Patient Instructions (Addendum)

## 2013-12-02 NOTE — Progress Notes (Signed)
79 y.o. G12P1001 Widowed Caucasian Fe here for annual exam.  Last cholesterol was normal.  Now starting with dyspnea.  PCP is aware and will have pulmonary function study done this Friday.  Carotid US was normal and will be having echo done this Wednesday.  Next Monday stress test at [place].  Having increase of urinary urgency at HS.  Dies well during the day.  No LMP recorded. Patient is postmenopausal.          Sexually active: No.  The current method of family planning is post menopausal status.    Exercising: Yes.    Home exercise routine includes walking. Smoker:  no  Health Maintenance: Pap:  11/12/09, WNL MMG:  06/18/13 Bi-Rads Neg Colonoscopy: 09/18/13 1 polyps removed, WNL BMD:   Dr. Radene Gunning 2015? Will signed a release. Gets Proila every 6 months. Next dose due 12/15 TDaP:  12/2011 Shingles: never  Pneumonia: 2013? Labs: PCP   reports that she has never smoked. She has never used smokeless tobacco. She reports that she does not drink alcohol or use illicit drugs.  Past Medical History  Diagnosis Date  . Paroxysmal supraventricular tachycardia   . Other chronic pulmonary heart diseases   . Other and unspecified hyperlipidemia   . Other symptoms involving cardiovascular system   . Personal history of other diseases of digestive system   . Sarcoidosis   . Esophageal reflux   . Allergy     SEASONAL  . Arthritis   . Osteoporosis   . HTN (hypertension)   . Carotid bruit   . Hematochezia   . Hx of colonoscopy     Past Surgical History  Procedure Laterality Date  . Trapezium resection Right   . Carpal tunnel release Left   . Shoulder arthroscopy w/ rotator cuff repair Right   . Foot surgery Right   . Bladder surgery    . Arm surgery Right   . Colonoscopy    . Polypectomy    . Hip surgery Right 2011    full replacement  . Leg surgery Left May 2014    femur fracture s/p open and closed reduction in [place], Dr. Jimmye Norman    Current Outpatient Prescriptions  Medication  Sig Dispense Refill  . diltiazem (CARDIZEM CD) 180 MG 24 hr capsule Take 180 mg by mouth daily.        . meloxicam (MOBIC) 15 MG tablet Take 15 mg by mouth as needed.       Marland Kitchen omeprazole (PRILOSEC) 40 MG capsule Take 40 mg by mouth daily.       . pravastatin (PRAVACHOL) 40 MG tablet Take 40 mg by mouth daily.      . traMADol (ULTRAM) 50 MG tablet Take 50 mg by mouth every 6 (six) hours as needed.       . Vitamin D, Ergocalciferol, (DRISDOL) 50000 UNITS CAPS Take 50,000 Units by mouth every 7 (seven) days.        No current facility-administered medications for this visit.    Family History  Problem Relation Age of Onset  . Colon cancer Mother 27  . Lung cancer Father   . Heart disease Father   . Esophageal cancer Neg Hx   . Rectal cancer Neg Hx   . Stomach cancer Neg Hx   . Heart attack Neg Hx   . Stroke Neg Hx   . Arrhythmia Father   . Cancer Mother   . Cancer Father     ROS:  Pertinent items are  noted in HPI.  Otherwise, a comprehensive ROS was negative.  Exam:   BP 128/58  Pulse 72  Ht 5\' 3"  (1.6 m)  Wt 133 lb (60.328 kg)  BMI 23.57 kg/m2 Height: 5\' 3"  (160 cm)  Ht Readings from Last 3 Encounters:  12/02/13 5\' 3"  (1.6 m)  11/28/13 5\' 3"  (1.6 m)  11/06/13 5\' 3"  (1.6 m)    General appearance: alert, cooperative and appears stated age Head: Normocephalic, without obvious abnormality, atraumatic Neck: no adenopathy, supple, symmetrical, trachea midline and thyroid normal to inspection and palpation Lungs: clear to auscultation bilaterally Breasts: normal appearance, no masses or tenderness Heart: regular rate and rhythm Abdomen: soft, non-tender; no masses,  no organomegaly Extremities: extremities normal, atraumatic, no cyanosis or edema Skin: Skin color, texture, turgor normal. No rashes or lesions Lymph nodes: Cervical, supraclavicular, and axillary nodes normal. No abnormal inguinal nodes palpated Neurologic: Grossly normal   Pelvic: External genitalia:  no  lesions              Urethra:  normal appearing urethra with no masses, tenderness or lesions              Bartholin's and Skene's: normal                 Vagina: normal appearing vagina with normal color and discharge, no lesions              Cervix: anteverted              Pap taken: Yes.  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

## 2013-12-03 ENCOUNTER — Encounter: Payer: Self-pay | Admitting: *Deleted

## 2013-12-03 NOTE — Progress Notes (Signed)
Encounter reviewed by Dr. Brook Silva.  

## 2013-12-04 ENCOUNTER — Ambulatory Visit (HOSPITAL_COMMUNITY): Payer: Medicare Other | Attending: Cardiovascular Disease

## 2013-12-04 DIAGNOSIS — I10 Essential (primary) hypertension: Secondary | ICD-10-CM | POA: Diagnosis not present

## 2013-12-04 DIAGNOSIS — I471 Supraventricular tachycardia: Secondary | ICD-10-CM | POA: Diagnosis not present

## 2013-12-04 DIAGNOSIS — M1990 Unspecified osteoarthritis, unspecified site: Secondary | ICD-10-CM | POA: Insufficient documentation

## 2013-12-04 DIAGNOSIS — D869 Sarcoidosis, unspecified: Secondary | ICD-10-CM | POA: Insufficient documentation

## 2013-12-04 DIAGNOSIS — R0602 Shortness of breath: Secondary | ICD-10-CM | POA: Diagnosis not present

## 2013-12-04 DIAGNOSIS — E785 Hyperlipidemia, unspecified: Secondary | ICD-10-CM | POA: Diagnosis not present

## 2013-12-04 DIAGNOSIS — K921 Melena: Secondary | ICD-10-CM | POA: Insufficient documentation

## 2013-12-04 DIAGNOSIS — K219 Gastro-esophageal reflux disease without esophagitis: Secondary | ICD-10-CM | POA: Insufficient documentation

## 2013-12-04 LAB — URINE CULTURE
COLONY COUNT: NO GROWTH
Organism ID, Bacteria: NO GROWTH

## 2013-12-04 NOTE — Progress Notes (Signed)
2D Echo completed. 12/04/2013

## 2013-12-05 LAB — IPS PAP SMEAR ONLY

## 2013-12-06 ENCOUNTER — Ambulatory Visit (HOSPITAL_COMMUNITY)
Admission: RE | Admit: 2013-12-06 | Discharge: 2013-12-06 | Disposition: A | Discharge status: Home / Self Care | Payer: Medicare Other | Source: Ambulatory Visit | Attending: Internal Medicine | Admitting: Internal Medicine

## 2013-12-06 DIAGNOSIS — R0600 Dyspnea, unspecified: Secondary | ICD-10-CM | POA: Insufficient documentation

## 2013-12-06 LAB — PULMONARY FUNCTION TEST
DL/VA % pred: 69 %
DL/VA: 3.25 ml/min/mmHg/L
DLCO COR % PRED: 41 %
DLCO UNC % PRED: 41 %
DLCO cor: 9.47 ml/min/mmHg
DLCO unc: 9.47 ml/min/mmHg
FEF 25-75 POST: 0.51 L/s
FEF 25-75 Pre: 0.41 L/sec
FEF2575-%CHANGE-POST: 25 %
FEF2575-%PRED-PRE: 27 %
FEF2575-%Pred-Post: 34 %
FEV1-%CHANGE-POST: 7 %
FEV1-%PRED-PRE: 52 %
FEV1-%Pred-Post: 56 %
FEV1-PRE: 1.02 L
FEV1-Post: 1.09 L
FEV1FVC-%Change-Post: 1 %
FEV1FVC-%PRED-PRE: 72 %
FEV6-%Change-Post: 7 %
FEV6-%Pred-Post: 79 %
FEV6-%Pred-Pre: 74 %
FEV6-Post: 1.95 L
FEV6-Pre: 1.82 L
FEV6FVC-%Change-Post: 1 %
FEV6FVC-%Pred-Post: 102 %
FEV6FVC-%Pred-Pre: 101 %
FVC-%CHANGE-POST: 6 %
FVC-%Pred-Post: 77 %
FVC-%Pred-Pre: 73 %
FVC-POST: 2.01 L
FVC-Pre: 1.89 L
POST FEV1/FVC RATIO: 54 %
PRE FEV1/FVC RATIO: 54 %
Post FEV6/FVC ratio: 97 %
Pre FEV6/FVC Ratio: 96 %
RV % pred: 95 %
RV: 2.19 L
TLC % pred: 86 %
TLC: 4.22 L

## 2013-12-06 MED ORDER — ALBUTEROL SULFATE (2.5 MG/3ML) 0.083% IN NEBU
2.5000 mg | INHALATION_SOLUTION | Freq: Once | RESPIRATORY_TRACT | Status: AC
  Administered 2013-12-06: 2.5 mg via RESPIRATORY_TRACT

## 2013-12-09 ENCOUNTER — Ambulatory Visit (HOSPITAL_COMMUNITY): Payer: Medicare Other | Attending: Cardiology | Admitting: Radiology

## 2013-12-09 VITALS — BP 141/70 | HR 67 | Ht 63.0 in | Wt 131.0 lb

## 2013-12-09 DIAGNOSIS — I10 Essential (primary) hypertension: Secondary | ICD-10-CM | POA: Diagnosis not present

## 2013-12-09 DIAGNOSIS — R0602 Shortness of breath: Secondary | ICD-10-CM

## 2013-12-09 DIAGNOSIS — R0609 Other forms of dyspnea: Secondary | ICD-10-CM | POA: Diagnosis not present

## 2013-12-09 MED ORDER — AMINOPHYLLINE 25 MG/ML IV SOLN: 75.0000 mg | Freq: Once | INTRAVENOUS | Status: DC

## 2013-12-09 MED ORDER — TECHNETIUM TC 99M SESTAMIBI GENERIC - CARDIOLITE
33.0000 | Freq: Once | INTRAVENOUS | Status: AC | PRN
Start: 2013-12-09 — End: 2013-12-09
  Administered 2013-12-09: 33 via INTRAVENOUS

## 2013-12-09 MED ORDER — REGADENOSON 0.4 MG/5ML IV SOLN
0.4000 mg | Freq: Once | INTRAVENOUS | Status: AC
  Administered 2013-12-09: 0.4 mg via INTRAVENOUS

## 2013-12-09 MED ORDER — DEXTROSE 5 % IV SOLN: 75.0000 mg | Freq: Once | INTRAVENOUS | Status: DC

## 2013-12-09 MED ORDER — AMINOPHYLLINE 25 MG/ML IV SOLN
75.0000 mg | Freq: Once | INTRAVENOUS | Status: AC
  Administered 2013-12-09: 75 mg via INTRAVENOUS

## 2013-12-09 MED ORDER — TECHNETIUM TC 99M SESTAMIBI GENERIC - CARDIOLITE
11.0000 | Freq: Once | INTRAVENOUS | Status: AC | PRN
  Administered 2013-12-09: 11 via INTRAVENOUS

## 2013-12-09 NOTE — Progress Notes (Signed)
[facility] [address] [place], [place] [address] [phone]    Cardiology Nuclear Med Study  [name] is a 79 y.o. female     MRN : [id number]     DOB: 08-05-1934  Procedure Date: 12/09/2013  Nuclear Med Background Indication for Stress Test:  Evaluation for Ischemia History:  No known CAD, MPI 2004 (normal) EF 71% Cardiac Risk Factors: Carotid Disease and Hypertension  Symptoms:  DOE   Nuclear Pre-Procedure Caffeine/Decaff Intake:  None> 12 hrs NPO After: 7:00pm   Lungs:  clear O2 Sat: 96% on room air. IV 0.9% NS with Angio Cath:  22g  IV Site: R Hand x 1, tolerated well IV Started by:  Irven Baltimore, RN  Chest Size (in):  38 Cup Size: C  Height: 5\' 3"  (1.6 m)  Weight:  131 lb (59.421 kg)  BMI:  Body mass index is 23.21 kg/(m^2). Tech Comments:  No medications today. Patient took Diltiazem last night. Irven Baltimore, RN.    Nuclear Med Study 1 or 2 day study: 1 day  Stress Test Type:  Treadmill/Lexiscan  Reading MD: N/A  Order Authorizing Provider:  Loralie Champagne, MD  Resting Radionuclide: Technetium 97m Sestamibi  Resting Radionuclide Dose: 11.0 mCi   Stress Radionuclide:  Technetium 47m Sestamibi  Stress Radionuclide Dose: 33.0 mCi           Stress Protocol Rest HR: 67 Stress HR: 89  Rest BP: 141/70 Stress BP: 96/52  Exercise Time (min): n/a METS: n/a           Dose of Adenosine (mg):  n/a Dose of Lexiscan: 0.4 mg  Dose of Atropine (mg): n/a Dose of Dobutamine: n/a mcg/kg/min (at max HR)  Stress Test Technologist: Glade Lloyd, BS-ES  Nuclear Technologist:  Earl Many, CNMT     Rest Procedure:  Myocardial perfusion imaging was performed at rest 45 minutes following the intravenous administration of Technetium 65m Sestamibi. Rest ECG: SR 67 bpm    Stress Procedure:  The patient received IV Lexiscan 0.4 mg over 15-seconds with concurrent low level exercise and then Technetium 60m Sestamibi was injected at  30-seconds while the patient continued walking one more minute.  Quantitative spect images were obtained after a 45-minute delay.  During the infusion of Lexiscan the patient complained of fatigue, SOB and severe back pain.  The severe back pain was not subsiding so 75 mg aminophylline was given and the back pain began to ease.  Stress ECG: No significant ST segment change suggestive of ischemia.  QPS Raw Data Images:  Soft tissue (diaphragm, bowel acitvity, breast) surrounds heart.   Stress Images:  Normal homogeneous uptake in all areas of the myocardium. Rest Images:  Normal homogeneous uptake in all areas of the myocardium. Subtraction (SDS):  No evidence of ischemia. Transient Ischemic Dilatation (Normal <1.22):  1.05 Lung/Heart Ratio (Normal <0.45):  0.26  Quantitative Gated Spect Images QGS EDV:  70 ml QGS ESV:  21 ml  Impression Exercise Capacity:  Lexiscan with low level exercise. BP Response:  Normal blood pressure response. Clinical Symptoms:  No chest pain. ECG Impression:  No significant ST segment change suggestive of ischemia. Comparison with Prior Nuclear Study:Prior scan reported normal  Overall Impression:  Normal stress nuclear study.  LV Ejection Fraction: 70%.  LV Wall Motion:  NL LV Function; NL Wall Motion  Dorris Carnes

## 2013-12-11 ENCOUNTER — Telehealth: Payer: Self-pay | Admitting: Cardiology

## 2013-12-11 NOTE — Telephone Encounter (Signed)
Patient informed of nuclear stress test results. Per Kathlene November, she should followup with Dr. Aundra Dubin in 6-8 weeks.

## 2013-12-11 NOTE — Telephone Encounter (Signed)
New message ° ° ° ° ° °Want nuclear stress test results °

## 2013-12-23 ENCOUNTER — Encounter: Payer: Self-pay | Admitting: Nurse Practitioner

## 2013-12-26 ENCOUNTER — Encounter: Payer: Self-pay | Admitting: Emergency Medicine

## 2013-12-26 ENCOUNTER — Ambulatory Visit (INDEPENDENT_AMBULATORY_CARE_PROVIDER_SITE_OTHER): Payer: Medicare Other | Admitting: Emergency Medicine

## 2013-12-26 VITALS — BP 124/86 | HR 74 | Temp 98.6°F | Ht 63.0 in | Wt 134.2 lb

## 2013-12-26 DIAGNOSIS — R0609 Other forms of dyspnea: Secondary | ICD-10-CM

## 2013-12-26 DIAGNOSIS — I6529 Occlusion and stenosis of unspecified carotid artery: Secondary | ICD-10-CM

## 2013-12-26 DIAGNOSIS — D869 Sarcoidosis, unspecified: Secondary | ICD-10-CM

## 2013-12-26 DIAGNOSIS — R0602 Shortness of breath: Secondary | ICD-10-CM | POA: Insufficient documentation

## 2013-12-26 MED ORDER — ALBUTEROL SULFATE HFA 108 (90 BASE) MCG/ACT IN AERS: 2.0000 | INHALATION_SPRAY | RESPIRATORY_TRACT | Status: DC | PRN

## 2013-12-26 NOTE — Progress Notes (Signed)
Subjective:    Patient ID: [name], female    DOB: 02/16/1935, 79 y.o.   MRN: [id number]  HPI 79 yo woman, never smoker, hx sarcoidosis that was dx by Dr Lucia Gaskins [place] after FOB (? Whether she had biopsies or not), PSVT, GERD. She also had a hx severe PNA with empyema and chest tube (90's). She has DOE and difficulty walking. The decrease in function has come on gradually. Her cardiac stress test was negative as below. PFT show obstruction with a DLCO defect.   PFT 12/06/13 >> severe AFL without BD response.    Review of Systems  Constitutional: Negative for fever and unexpected weight change.  HENT: Negative for congestion, dental problem, ear pain, nosebleeds, postnasal drip, rhinorrhea, sinus pressure, sneezing, sore throat and trouble swallowing.   Eyes: Negative for redness and itching.  Respiratory: Negative for cough, chest tightness, shortness of breath and wheezing.   Cardiovascular: Negative for palpitations and leg swelling.  Gastrointestinal: Negative for nausea and vomiting.  Genitourinary: Negative for dysuria.  Musculoskeletal: Negative for joint swelling.  Skin: Negative for rash.  Neurological: Negative for headaches.  Hematological: Does not bruise/bleed easily.  Psychiatric/Behavioral: Negative for dysphoric mood. The patient is not nervous/anxious.    Past Medical History  Diagnosis Date  . Paroxysmal supraventricular tachycardia   . Other chronic pulmonary heart diseases   . Other and unspecified hyperlipidemia   . Other symptoms involving cardiovascular system   . Personal history of other diseases of digestive system   . Sarcoidosis   . Esophageal reflux   . Allergy     SEASONAL  . Arthritis   . Osteoporosis   . Carotid bruit   . Hematochezia   . Hx of colonoscopy      Family History  Problem Relation Age of Onset  . Colon cancer Mother 25  . Lung cancer Father   . Heart disease Father   . Esophageal cancer Neg Hx   . Rectal cancer Neg  Hx   . Stomach cancer Neg Hx   . Heart attack Neg Hx   . Stroke Neg Hx   . Arrhythmia Father   . Cancer Mother   . Cancer Father      History   Social History  . Marital Status: Widowed    Spouse Name: N/A    Number of Children: 1  . Years of Education: N/A   Occupational History  . retired     still works as Oceanographer  .     Social History Main Topics  . Smoking status: Never Smoker   . Smokeless tobacco: Never Used  . Alcohol Use: No  . Drug Use: No  . Sexual Activity: No   Other Topics Concern  . Not on file   Social History Narrative     Allergies  Allergen Reactions  . Morphine Nausea Only  . Oxycodone-Acetaminophen     "talking out of my head"  . Percocet [Oxycodone-Acetaminophen] Nausea And Vomiting  . Sulfonamide Derivatives     Pt unsure     Outpatient Prescriptions Prior to Visit  Medication Sig Dispense Refill  . diltiazem (CARDIZEM CD) 180 MG 24 hr capsule Take 180 mg by mouth daily.      . meloxicam (MOBIC) 15 MG tablet Take 15 mg by mouth as needed.     Marland Kitchen omeprazole (PRILOSEC) 40 MG capsule Take 40 mg by mouth daily.     . pravastatin (PRAVACHOL) 40 MG tablet Take  40 mg by mouth daily.    . traMADol (ULTRAM) 50 MG tablet Take 50 mg by mouth every 6 (six) hours as needed.     . Vitamin D, Ergocalciferol, (DRISDOL) 50000 UNITS CAPS Take 50,000 Units by mouth every 7 (seven) days.     Marland Kitchen aminophylline 25 MG/ML injection Inject 3 mLs (75 mg total) into the vein once. 3 mL 0   No facility-administered medications prior to visit.         Objective:   Physical Exam Filed Vitals:   12/26/13 1415  BP: 124/86  Pulse: 74  Temp: 98.6 F (37 C)  TempSrc: Oral  Height: 5\' 3"  (1.6 m)  Weight: 134 lb 3.2 oz (60.873 kg)  SpO2: 97%   Gen: Pleasant, kyphotic elderly woman, in no distress,  normal affect  ENT: No lesions,  mouth clear,  oropharynx clear, no postnasal drip  Neck: No JVD, no TMG, no carotid bruits  Lungs: No use of  accessory muscles, clear without rales or rhonchi  Cardiovascular: RRR, heart sounds normal, no murmur or gallops, no peripheral edema  Musculoskeletal: No deformities, no cyanosis or clubbing  Neuro: alert, non focal  Skin: Warm, no lesions or rashes   11/28/12 --  COMPARISON: Chest CT 05/10/2006. FINDINGS: Mediastinum: Heart size is normal. There is no significant pericardial fluid, thickening or pericardial calcification. There is atherosclerosis of the thoracic aorta, the great vessels of the mediastinum and the coronary arteries, including calcified atherosclerotic plaque in the left main, left circumflex and left anterior descending coronary arteries. No pathologically enlarged mediastinal or hilar lymph nodes. Please note that accurate exclusion of hilar adenopathy is limited on noncontrast CT scans. Small hiatal hernia.  Lungs/Pleura: No suspicious appearing pulmonary nodules or masses are identified at this time. No acute consolidative airspace disease. No pleural effusions. Mild cylindrical and varicose bronchiectasis in the medial segment of the right middle lobe with some associated architectural distortion, compatible with post infectious or inflammatory scarring. Linear scarring in the posterior aspect of the right lower lobe adjacent to old rib fractures.  Upper Abdomen: Unremarkable.  Musculoskeletal: Old healed fractures of the posterior aspects of the right 10th and 11th ribs. There are no aggressive appearing lytic or blastic lesions noted in the visualized portions of the skeleton. Old compression type fracture at T11 with approximately 25% loss of anterior vertebral body height, unchanged.  IMPRESSION: 1. No suspicious appearing pulmonary nodule or masses identified at this time to suggest underlying bronchogenic neoplasm. 2. Mild scarring in the right middle lobe and posterior aspect of the right lower lobe. 3. Atherosclerosis, including left  main and 2 vessel coronary artery disease. Assessment for potential risk factor modification, dietary therapy or pharmacologic therapy may be warranted, if clinically indicated. 4. Small hiatal hernia.   12/10/13 --  Myocardial perfusion scan >> normal perfusion       Assessment & Plan:  SARCOIDOSIS, PULMONARY Presumed sarcoidosis based on her prior workup at [facility]. Not clear to me whether she was biopsied or not but she did undergo bronchoscopy. Her pulmonary function testing shows fixed obstruction. There is no evidence of interstitial disease or lymphadenopathy on her CT scan from 11/28/12.  - I would like to try her on albuterol when necessary to see if she benefits; if so then we could consider a scheduled bronchodilator - We will obtain her old records from [facility] to see if and when she was biopsied - Walking oximetry today  DOE (dyspnea on exertion) Suspect that  much of this is due to her airflow obstruction. Her cardiac evaluation has been reassuring. There is no evidence of interstitial lung disease.  - Walking oximetry today - Bronchodilators as mentioned

## 2013-12-26 NOTE — Patient Instructions (Signed)
Please try using albuterol 2 puffs up to every 4 hours if needed for shortness of breath Keep track of how often use the albuterol and how much it helps you We will perform walking oximetry today Follow with Dr [name] in 2 months or sooner if you have any problems.

## 2013-12-26 NOTE — Addendum Note (Signed)
Addended by: Mathis Dad on: 12/26/2013 03:48 PM   Modules accepted: Orders

## 2013-12-26 NOTE — Assessment & Plan Note (Signed)
Suspect that much of this is due to her airflow obstruction. Her cardiac evaluation has been reassuring. There is no evidence of interstitial lung disease.  - Walking oximetry today - Bronchodilators as mentioned

## 2013-12-26 NOTE — Assessment & Plan Note (Signed)
Presumed sarcoidosis based on her prior workup at [facility]. Not clear to me whether she was biopsied or not but she did undergo bronchoscopy. Her pulmonary function testing shows fixed obstruction. There is no evidence of interstitial disease or lymphadenopathy on her CT scan from 11/28/12.  - I would like to try her on albuterol when necessary to see if she benefits; if so then we could consider a scheduled bronchodilator - We will obtain her old records from [facility] to see if and when she was biopsied - Walking oximetry today

## 2014-01-01 ENCOUNTER — Telehealth: Payer: Self-pay | Admitting: *Deleted

## 2014-01-01 NOTE — Telephone Encounter (Signed)
error 

## 2014-02-05 ENCOUNTER — Ambulatory Visit (INDEPENDENT_AMBULATORY_CARE_PROVIDER_SITE_OTHER): Payer: Medicare Other | Admitting: Sports Medicine

## 2014-02-05 ENCOUNTER — Encounter: Payer: Self-pay | Admitting: Sports Medicine

## 2014-02-05 VITALS — BP 111/74 | Ht 63.0 in | Wt 128.0 lb

## 2014-02-05 DIAGNOSIS — I6529 Occlusion and stenosis of unspecified carotid artery: Secondary | ICD-10-CM

## 2014-02-05 DIAGNOSIS — R269 Unspecified abnormalities of gait and mobility: Secondary | ICD-10-CM

## 2014-02-05 NOTE — Progress Notes (Signed)
Patient ID: [name], female   DOB: [birth year], 79 y.o.   MRN: [id number]  Patient with large LLI for whom we made orthotics These have helped her back and hip pain a lot However, the left orthotic slips out of heel   She tried a shoe adjusted to outside from [place] her feel unstable  Exam NAD, alert BP 111/74 mmHg  Ht 5\' 3"  (1.6 m)  Wt 128 lb (58.06 kg)  BMI 22.68 kg/m2  Gait in black shoes from [employer] with outside lift is unsteady with too much trunk shift Overcorrection of lift on left with that side higher  In Orthotics she has a good adjustment but left foot slipping  Shave orthotics at heel Now gait shows a slight trendelenburg shift to left but is more comfortable

## 2014-02-05 NOTE — Assessment & Plan Note (Signed)
I made adjustment to left orthotic and this seemed to help her comfort  This did give her a bit more trendelenburg shift  Test for 1 month Be sure no increase in hip pain  Reck if issues and consider flat orthotic or lift just to left

## 2014-02-25 ENCOUNTER — Encounter: Payer: Self-pay | Admitting: Emergency Medicine

## 2014-02-25 ENCOUNTER — Ambulatory Visit (INDEPENDENT_AMBULATORY_CARE_PROVIDER_SITE_OTHER): Payer: Medicare Other | Admitting: Emergency Medicine

## 2014-02-25 VITALS — BP 110/60 | HR 71 | Temp 97.6°F | Ht 63.0 in | Wt 133.6 lb

## 2014-02-25 DIAGNOSIS — Z23 Encounter for immunization: Secondary | ICD-10-CM

## 2014-02-25 DIAGNOSIS — D869 Sarcoidosis, unspecified: Secondary | ICD-10-CM

## 2014-02-25 NOTE — Addendum Note (Signed)
Addended by: Desmond Dike C on: 02/25/2014 02:51 PM   Modules accepted: Orders

## 2014-02-25 NOTE — Patient Instructions (Signed)
Please start Symbicort 2 puffs twice a day. Remember to rinse your mouth and gargle after using.  Please call our office if the new medication helps you. If so then we will order it through your pharmacy We will give you the Prevnar 13 and pneumonia vaccine today Follow with Dr [name] in 6 months or sooner if you have any problems

## 2014-02-25 NOTE — Assessment & Plan Note (Signed)
She feels that her breathing is better in part because the weather has become cooler. She also believes that the albuterol did help her on the occasions when she took it. I would like to try a LABA/ ICS to see if she would benefit. If she doesn't we will order this through the pharmacy. She inquires about pneumonia vaccine she would be a reasonable candidate for Prevnar 13. Finally her last CT scan was in 2014. We'll follow her with chest x-ray for now but she will likely need a repeat CT scan in 2016.   - start Symbicort 80; continue if she benefits.

## 2014-02-25 NOTE — Progress Notes (Signed)
Subjective:    Patient ID: [name], female    DOB: [birth year], 80 y.o.   MRN: [id number]  HPI 80 yo woman, never smoker, hx sarcoidosis that was dx by Dr Lucia Gaskins in [place] after FOB (? Whether she had biopsies or not), PSVT, GERD. She also had a hx severe PNA with empyema and chest tube (90's). She has DOE and difficulty walking. The decrease in function has come on gradually. Her cardiac stress test was negative as below. PFT show obstruction with a DLCO defect.   PFT 12/06/13 >> severe AFL without BD response.   ROV 02/25/14 -- follow up for progressive dyspnea in setting sarcoidosis. Last time we decided to initiate short acting beta agonist to see if she would benefit. Walking oximetry was reassuring. She has been doing a bit better - she believes that she does better now that the hot weather is passed. She used SABA a few times and felt that it helped her some. She is asking about pneumonia vaccine.    Review of Systems  Constitutional: Negative for fever and unexpected weight change.  HENT: Negative for congestion, dental problem, ear pain, nosebleeds, postnasal drip, rhinorrhea, sinus pressure, sneezing, sore throat and trouble swallowing.   Eyes: Negative for redness and itching.  Respiratory: Negative for cough, chest tightness, shortness of breath and wheezing.   Cardiovascular: Negative for palpitations and leg swelling.  Gastrointestinal: Negative for nausea and vomiting.  Genitourinary: Negative for dysuria.  Musculoskeletal: Negative for joint swelling.  Skin: Negative for rash.  Neurological: Negative for headaches.  Hematological: Does not bruise/bleed easily.  Psychiatric/Behavioral: Negative for dysphoric mood. The patient is not nervous/anxious.       Objective:   Physical Exam Filed Vitals:   02/25/14 1410  BP: 110/60  Pulse: 71  Temp: 97.6 F (36.4 C)  TempSrc: Oral  Height: 5\' 3"  (1.6 m)  Weight: 133 lb 9.6 oz (60.601 kg)  SpO2: 97%   Gen: Pleasant,  kyphotic elderly woman, in no distress,  normal affect  ENT: No lesions,  mouth clear,  oropharynx clear, no postnasal drip  Neck: No JVD, no TMG, no carotid bruits  Lungs: No use of accessory muscles, clear without rales or rhonchi  Cardiovascular: RRR, heart sounds normal, no murmur or gallops, no peripheral edema  Musculoskeletal: No deformities, no cyanosis or clubbing  Neuro: alert, non focal  Skin: Warm, no lesions or rashes   11/28/12 --  COMPARISON: Chest CT 05/10/2006. FINDINGS: Mediastinum: Heart size is normal. There is no significant pericardial fluid, thickening or pericardial calcification. There is atherosclerosis of the thoracic aorta, the great vessels of the mediastinum and the coronary arteries, including calcified atherosclerotic plaque in the left main, left circumflex and left anterior descending coronary arteries. No pathologically enlarged mediastinal or hilar lymph nodes. Please note that accurate exclusion of hilar adenopathy is limited on noncontrast CT scans. Small hiatal hernia.  Lungs/Pleura: No suspicious appearing pulmonary nodules or masses are identified at this time. No acute consolidative airspace disease. No pleural effusions. Mild cylindrical and varicose bronchiectasis in the medial segment of the right middle lobe with some associated architectural distortion, compatible with post infectious or inflammatory scarring. Linear scarring in the posterior aspect of the right lower lobe adjacent to old rib fractures.  Upper Abdomen: Unremarkable.  Musculoskeletal: Old healed fractures of the posterior aspects of the right 10th and 11th ribs. There are no aggressive appearing lytic or blastic lesions noted in the visualized portions of the skeleton. Old  compression type fracture at T11 with approximately 25% loss of anterior vertebral body height, unchanged.  IMPRESSION: 1. No suspicious appearing pulmonary nodule or masses  identified at this time to suggest underlying bronchogenic neoplasm. 2. Mild scarring in the right middle lobe and posterior aspect of the right lower lobe. 3. Atherosclerosis, including left main and 2 vessel coronary artery disease. Assessment for potential risk factor modification, dietary therapy or pharmacologic therapy may be warranted, if clinically indicated. 4. Small hiatal hernia.   12/10/13 --  Myocardial perfusion scan >> normal perfusion       Assessment & Plan:  SARCOIDOSIS, PULMONARY She feels that her breathing is better in part because the weather has become cooler. She also believes that the albuterol did help her on the occasions when she took it. I would like to try a LABA/ ICS to see if she would benefit. If she doesn't we will order this through the pharmacy. She inquires about pneumonia vaccine she would be a reasonable candidate for Prevnar 13. Finally her last CT scan was in 2014. We'll follow her with chest x-ray for now but she will likely need a repeat CT scan in 2016.   - start Symbicort 80; continue if she benefits.

## 2014-03-31 ENCOUNTER — Telehealth: Payer: Self-pay | Admitting: Nurse Practitioner

## 2014-03-31 NOTE — Telephone Encounter (Signed)
Patient states she is having a problem with overactive bladder.

## 2014-03-31 NOTE — Telephone Encounter (Signed)
Spoke with patient. She states that her overactive bladder symptoms have been ongoing and are worse at night but now becoming a greater concern for patient. Has very limited time getting up at night and making it to the bathroom. No dysuria, flank pain or fevers. Requests appointment for 04/01/14. Scheduled for patient. Has changed drug coverage plan and would like to discuss treatment options with Milford Cage, FNP.   Routing to provider for final review. Patient agreeable to disposition. Will close encounter

## 2014-04-01 ENCOUNTER — Encounter: Payer: Self-pay | Admitting: Nurse Practitioner

## 2014-04-01 ENCOUNTER — Ambulatory Visit (INDEPENDENT_AMBULATORY_CARE_PROVIDER_SITE_OTHER): Payer: Medicare Other | Admitting: Nurse Practitioner

## 2014-04-01 VITALS — BP 120/62 | Temp 97.8°F | Resp 14 | Ht 63.0 in | Wt 137.4 lb

## 2014-04-01 DIAGNOSIS — R3915 Urgency of urination: Secondary | ICD-10-CM

## 2014-04-01 DIAGNOSIS — N3281 Overactive bladder: Secondary | ICD-10-CM

## 2014-04-01 MED ORDER — CIPROFLOXACIN HCL 250 MG PO TABS: ORAL_TABLET | ORAL | Status: DC

## 2014-04-01 MED ORDER — OXYBUTYNIN CHLORIDE ER 5 MG PO TB24: 5.0000 mg | ORAL_TABLET | Freq: Every day | ORAL | Status: DC

## 2014-04-01 NOTE — Progress Notes (Signed)
Subjective:     Patient ID: [name], female   DOB: 1934-02-29, 80 y.o.   MRN: [id number]  HPI  This 80 yo [place] Fe G1P1 with history of OAB.  She has been treated for this in the past with samples we gave her of Myrbetriq.  She liked the medication but could not afford the RX.  She has several family functions coming up with weddings and trips and is frustrated with having to get up often to void at HS.  She then feels very tired the next day.  She wants to try another med for OAB.  She is also S/P bladder mesh surgery years ago.  She inquires about Botox as she wants help with this problem that might not require daily po med's.  Currently no vaginal symptoms, no dysuria.     Review of Systems  Constitutional: Negative for fever, chills, diaphoresis and fatigue.  Respiratory: Negative.   Cardiovascular: Negative.   Gastrointestinal: Negative.   Genitourinary: Positive for urgency and frequency. Negative for dysuria, hematuria, flank pain, vaginal discharge, vaginal pain and pelvic pain.  Musculoskeletal: Negative.   Skin: Negative.   Neurological: Negative.   Psychiatric/Behavioral: Negative.    Urine: trace RBC and trace Protein    Objective:   Physical Exam  Constitutional: She is oriented to person, place, and time. She appears well-developed and well-nourished. No distress.  Cardiovascular: Normal rate.   Pulmonary/Chest: Effort normal.  Abdominal: Soft. She exhibits no distension and no mass. There is no tenderness. There is no rebound and no guarding.  No flank pain  Genitourinary:  No vaginal discharge.  No urethral redness or prolapse.  There is a little relaxation of pelvic muscle with valsalva.  Mesh does feel to be supportive.  Musculoskeletal: Normal range of motion.  Neurological: She is alert and oriented to person, place, and time.  Psychiatric: She has a normal mood and affect. Her behavior is normal. Judgment and thought content normal.  Appears to be younger than  stated age.       Assessment:     OAB  S/P bladder mesh surgery years ago R/O UTI    Plan:     She is given RX that is on her preferred list she has in hand for Ditropan XL 5 mg daily.  Given potential side effects and precautions.   Given RX for Cipro 250 mg BID until urine culture and micro are back Will make a referral to Dr. Amalia Hailey for Botox evaluation and / or treatment

## 2014-04-01 NOTE — Progress Notes (Deleted)
Patient ID: [name], female   DOB: [birth year], 80 y.o.   MRN: [id number] This 80 yo [place] Fe Mybetrique does help but getting worse.    Plan:  Urine C&S Cipro 250 mg bid Ditropan

## 2014-04-01 NOTE — Progress Notes (Signed)
Encounter reviewed by Dr. Ramie Erman Silva.  

## 2014-04-02 ENCOUNTER — Encounter: Payer: Self-pay | Admitting: Sports Medicine

## 2014-04-02 ENCOUNTER — Ambulatory Visit (INDEPENDENT_AMBULATORY_CARE_PROVIDER_SITE_OTHER): Payer: Medicare Other | Admitting: Sports Medicine

## 2014-04-02 VITALS — BP 109/67 | Ht 63.0 in | Wt 128.0 lb

## 2014-04-02 DIAGNOSIS — M2170 Unequal limb length (acquired), unspecified site: Secondary | ICD-10-CM

## 2014-04-02 DIAGNOSIS — M4120 Other idiopathic scoliosis, site unspecified: Secondary | ICD-10-CM

## 2014-04-02 DIAGNOSIS — R269 Unspecified abnormalities of gait and mobility: Secondary | ICD-10-CM

## 2014-04-02 LAB — URINALYSIS, MICROSCOPIC ONLY
BACTERIA UA: NONE SEEN
CASTS: NONE SEEN
CRYSTALS: NONE SEEN

## 2014-04-02 LAB — URINE CULTURE
Colony Count: NO GROWTH
ORGANISM ID, BACTERIA: NO GROWTH

## 2014-04-02 NOTE — Assessment & Plan Note (Signed)
We have a mild lift on the left but now with a lateral correction

## 2014-04-02 NOTE — Patient Instructions (Signed)
Work on your scoliosis several times per week  Lean to Right twice for every time to left Lean 10 times right and 5 times left  Rotate your waist 10 times  Do a back stretch and pull your arms back with - 3 times holding for a count of 10  Orthotics I added a lateral wedge to try to lessen the limp  See if this helps  If doing well continue but if not I will adjust again

## 2014-04-02 NOTE — Assessment & Plan Note (Signed)
We made a lateral post and a lateral heel wedge for the left orthotic  This improved her gait and lessen the Trendelenburg  We will have her do that consistently for the next month and if it is working continue with this plan

## 2014-04-02 NOTE — Progress Notes (Signed)
Patient ID: [name], female   DOB: 02-26-1934, 80 y.o.   MRN: [id number]  Patient with a history of left hip fracture this gave her an unequal leg length with the left being shorter She has had some persistent right hip and back pain ever since the fracture She does have kyphoscoliosis  We made her custom orthotics which have helped a great deal However some of the pain is coming back and she feels that she is starting to limp again She comes for correction of the orthotics  Physical exam No acute distress BP 109/67 mmHg  Ht 5\' 3"  (1.6 m)  Wt 128 lb (58.06 kg)  BMI 22.68 kg/m2  Patient with obvious kyphoscoliosis Lumbar is concave to the left Significant left leg shortening Gait shows that she has a rapid Trendelenburg the left This gives her some pain at the right SI joint

## 2014-04-02 NOTE — Assessment & Plan Note (Signed)
Given a series of motion exercises for the low back to try to stabilize her degree of curve and lessen her kyphosis

## 2014-04-16 ENCOUNTER — Telehealth: Payer: Self-pay | Admitting: Nurse Practitioner

## 2014-04-16 DIAGNOSIS — N3281 Overactive bladder: Secondary | ICD-10-CM

## 2014-04-16 NOTE — Telephone Encounter (Signed)
Dr. Nicki Reaper McDiarmid is another Urology MD that she could see.

## 2014-04-16 NOTE — Telephone Encounter (Signed)
Routing to Patricia Rolen-Grubb, FNP for review and advise. 

## 2014-04-16 NOTE — Telephone Encounter (Signed)
Pt was referred and scheduled with Dr Amalia Hailey for urology. Pt states she looked up his information and saw a number of 'bad reviews'. Pt felt uncomfortable seeing Dr Amalia Hailey and cancelled her appointment. Pt requests other recommendations by Ms Chong Sicilian.

## 2014-04-17 NOTE — Telephone Encounter (Signed)
Referral placed to [facility]. Spoke with patient. Patient states that she spoke with [facility] yesterday and is going to see a PA tomorrow in hopes to get in with Dr.Jordan. Advised would let Milford Cage, FNP know and if she needs anything to give our office a call. Patient is agreeable.   Routing to provider for final review. Patient agreeable to disposition. Will close encounter

## 2014-05-08 ENCOUNTER — Other Ambulatory Visit: Payer: Self-pay

## 2014-05-08 DIAGNOSIS — Z1231 Encounter for screening mammogram for malignant neoplasm of breast: Secondary | ICD-10-CM

## 2014-06-16 ENCOUNTER — Ambulatory Visit
Admission: RE | Admit: 2014-06-16 | Discharge: 2014-06-16 | Disposition: A | Discharge status: Home / Self Care | Payer: Medicare Other | Source: Ambulatory Visit

## 2014-06-16 DIAGNOSIS — Z1231 Encounter for screening mammogram for malignant neoplasm of breast: Secondary | ICD-10-CM

## 2014-06-18 ENCOUNTER — Ambulatory Visit (INDEPENDENT_AMBULATORY_CARE_PROVIDER_SITE_OTHER): Payer: Medicare Other | Admitting: Sports Medicine

## 2014-06-18 ENCOUNTER — Encounter: Payer: Self-pay | Admitting: Sports Medicine

## 2014-06-18 VITALS — BP 130/54 | Ht 63.0 in | Wt 128.0 lb

## 2014-06-18 DIAGNOSIS — R269 Unspecified abnormalities of gait and mobility: Secondary | ICD-10-CM

## 2014-06-18 NOTE — Assessment & Plan Note (Signed)
We modified her orthotics today  I left a small amount of correction for the leg length difference  I removed the lateral wedge but left a lateral post  The right orthotic we made the base thinner and kept a neutral position  On completion she was able to walk with only a slight Trendelenburg shift and with better comfort

## 2014-06-18 NOTE — Progress Notes (Signed)
Patient ID: [name], female   DOB: 03/03/1934, 80 y.o.   MRN: [id number]  Patient had a femur fracture a few years ago that led to significant shortening of the left leg and a gait abnormality Placed her in orthotics with a leg length correction and she progressively got rid of hip and low back pain  Now her biggest issue is getting the orthotics comfortable enough so she does not feel that she is slipping out of her shoes  She feels that her gait has improved and that she has less unsteadiness and pain  She also feels that the scoliosis exercises have helped  Examination No acute distress BP 130/54 mmHg  Ht 5\' 3"  (1.6 m)  Wt 128 lb (58.06 kg)  BMI 22.68 kg/m2  Leg length difference is approximately 2 cm She has significant kyphoscoliosis Now when she is walking even without correction -- she can control most of the Trendelenburg shift to the left  Left orthotic in particular places are high in her shoe

## 2014-07-08 ENCOUNTER — Ambulatory Visit (INDEPENDENT_AMBULATORY_CARE_PROVIDER_SITE_OTHER): Payer: Medicare Other | Admitting: Sports Medicine

## 2014-07-08 ENCOUNTER — Encounter: Payer: Self-pay | Admitting: Sports Medicine

## 2014-07-08 VITALS — BP 132/46 | Ht 63.0 in | Wt 128.0 lb

## 2014-07-08 DIAGNOSIS — M2170 Unequal limb length (acquired), unspecified site: Secondary | ICD-10-CM | POA: Diagnosis not present

## 2014-07-08 DIAGNOSIS — R269 Unspecified abnormalities of gait and mobility: Secondary | ICD-10-CM

## 2014-07-08 NOTE — Assessment & Plan Note (Signed)
Patient was fitted for a standard, cushioned, semi-rigid orthotic. The orthotic was heated and afterward the patient stood on the orthotic blank positioned on the orthotic stand. The patient was positioned in subtalar neutral position and 10 degrees of ankle dorsiflexion in a weight bearing stance. After completion of molding, a stable base was applied to the orthotic blank. The blank was ground to a stable position for weight bearing. Size: 8 red EVA with cushion cell technology Base: none on RT Posting: heel lift on left Additional orthotic padding: none Preparation time of 40 mins  At completion of the procedure patient had good relief of pain  We were able to control about 80% of her Trendelenburg shift

## 2014-07-08 NOTE — Assessment & Plan Note (Signed)
Heel pad lift on left in shoe

## 2014-07-08 NOTE — Progress Notes (Signed)
Patient ID: [name], female   DOB: 07-28-1934, 80 y.o.   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 This cause significant back and hip pain Made orthotics which lessens the pain by about 70%  Currently the orthotic she has do not fit in many shoes With this in mind I brought her back for a different/ thinner type of orthotic We will try to correct her Trendelenburg Lessen the buildup on the right leg  Examination Pleasant and in no acute distress BP 132/46 mmHg  Ht 5\' 3"  (1.6 m)  Wt 128 lb (58.06 kg)  BMI 22.68 kg/m2  Walking gait barefoot shows a significant Trendelenburg shift to the left Right foot has dysfunctional toes 4 and 5 and breakdown of the lateral column Hip range of motion and walking do not create pain today  Older orthotics intact with lift on left

## 2014-09-02 ENCOUNTER — Encounter: Payer: Self-pay | Admitting: Family Medicine

## 2014-09-02 ENCOUNTER — Ambulatory Visit (INDEPENDENT_AMBULATORY_CARE_PROVIDER_SITE_OTHER): Payer: Medicare Other | Admitting: Family Medicine

## 2014-09-02 VITALS — BP 144/52 | Ht 63.0 in | Wt 130.0 lb

## 2014-09-02 DIAGNOSIS — M79672 Pain in left foot: Secondary | ICD-10-CM

## 2014-09-02 HISTORY — DX: Pain in left foot: M79.672

## 2014-09-02 NOTE — Progress Notes (Signed)
[name] - 80 y.o. female MRN [id number]  Date of birth: 08-03-1934  CC: Left foot pain  SUBJECTIVE:   HPI  [name] is a extremely pleasant 80 year old with 10 days of new onset left foot pain. The pain is located on the dorsum of her left foot. She has insoles that she had made here at the sports medicine clinic 2 months ago and have served her well thus far. 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 When she is wearing her shoes with insoles, she has minimal pain. When she is walking around in slippers that is when she is having the majority of her pain. She and has noted some swelling of her left ankle.  She was seen at an outside office 4 days ago and was told she had tendinitis. She brought with her her x-rays from the outside office, which were negative.  She is instructed to wear short walking boot for 1-2 months but decided not to do this because she was worried she would fall. She denies any fevers, chills, or night sweats. She denies trauma to the foot. Notably she is actually done better the last 2 days. She attributes this to epsom salt soaks.   ROS:   As per HPI.  HISTORY: Past Medical, Surgical, Social, and Family History Reviewed & Updated per EMR.  Pertinent Historical Findings include: Unremarkable  OBJECTIVE: BP 144/52 mmHg  Ht 5\' 3"  (1.6 m)  Wt 130 lb (58.968 kg)  BMI 23.03 kg/m2  Physical Exam  No acute distress, very pleasant, and smiling. Resp: Non-labored breathing MSK:  - Barefoot gait again reveals a Trendelenburg shift to the left.  Not antalgic - No visible abnormality on the dorsum of her left foot. No boney point tenderness on the dorsum of her foot. 5/5 ankle strength with inversion, eversion, plantarflexion, and dorsiflexion (no pain). - Right foot has dysfunctional toes 4 and 5 and breakdown of the lateral column Vasc: 1+ pitting edema surrounding the left lower ankle. Skin: No visible  rash or lesion    MEDICATIONS, LABS & OTHER ORDERS: Previous Medications   ALBUTEROL (PROVENTIL HFA;VENTOLIN HFA) 108 (90 BASE) MCG/ACT INHALER    Inhale 2 puffs into the lungs every 4 (four) hours as needed for wheezing or shortness of breath.   CEFDINIR (OMNICEF) 300 MG CAPSULE       CIPROFLOXACIN (CIPRO) 250 MG TABLET    1 tablet twice a day   DILTIAZEM (CARDIZEM CD) 180 MG 24 HR CAPSULE    Take 180 mg by mouth daily.     FLUTICASONE (FLONASE) 50 MCG/ACT NASAL SPRAY       GABAPENTIN (NEURONTIN) 100 MG CAPSULE    2 (two) times daily.    LEVOFLOXACIN (LEVAQUIN) 500 MG TABLET       MELOXICAM (MOBIC) 15 MG TABLET    Take 15 mg by mouth as needed.    OMEPRAZOLE (PRILOSEC) 40 MG CAPSULE    Take 40 mg by mouth daily.    OXYBUTYNIN (DITROPAN XL) 5 MG 24 HR TABLET    Take 1 tablet (5 mg total) by mouth at bedtime.   PRAVASTATIN (PRAVACHOL) 40 MG TABLET    Take 40 mg by mouth daily.   PREDNISONE (DELTASONE) 10 MG TABLET       TRAMADOL (ULTRAM) 50 MG TABLET    Take 50 mg by mouth every 6 (six) hours as needed.    VITAMIN D, ERGOCALCIFEROL, (DRISDOL)  50000 UNITS CAPS    Take 50,000 Units by mouth every 7 (seven) days.    Modified Medications   No medications on file   New Prescriptions   No medications on file   Discontinued Medications   No medications on file  No orders of the defined types were placed in this encounter.   ASSESSMENT & PLAN: See problem based charting & AVS for pt instructions.

## 2014-09-18 ENCOUNTER — Ambulatory Visit (INDEPENDENT_AMBULATORY_CARE_PROVIDER_SITE_OTHER): Payer: Medicare Other | Admitting: Emergency Medicine

## 2014-09-18 ENCOUNTER — Encounter: Payer: Self-pay | Admitting: Emergency Medicine

## 2014-09-18 VITALS — BP 124/78 | HR 60 | Ht 63.0 in | Wt 137.0 lb

## 2014-09-18 DIAGNOSIS — D869 Sarcoidosis, unspecified: Secondary | ICD-10-CM | POA: Diagnosis not present

## 2014-09-18 NOTE — Progress Notes (Signed)
Subjective:    Patient ID: [name], female    DOB: [birth year], 80 y.o.   MRN: [id number]  HPI 80 yo woman, never smoker, hx sarcoidosis that was dx by Dr Lucia Gaskins in [place] after FOB (? Whether she had biopsies or not), PSVT, GERD. She also had a hx severe PNA with empyema and chest tube (90's). She has DOE and difficulty walking. The decrease in function has come on gradually. Her cardiac stress test was negative as below. PFT show obstruction with a DLCO defect.   PFT 12/06/13 >> severe AFL without BD response.   ROV 02/25/14 -- follow up for progressive dyspnea in setting sarcoidosis. Last time we decided to initiate short acting beta agonist to see if she would benefit. Walking oximetry was reassuring. She has been doing a bit better - she believes that she does better now that the hot weather is passed. She used SABA a few times and felt that it helped her some. She is asking about pneumonia vaccine.   ROV 09/18/14 -- follow-up visit for history of sarcoidosis. Her last visit we started Symbicort to see if she would benefit.  She used the symbicort about qd, started to forget it. She isn't sure that she felt better on it. She reports that she had an acute flare in June, followed a trip to [place]. She required treatment with pred and abx. She has albuterol that she uses prn, very rarely. She is otherwise well, no pulm limitations at baseline.    Review of Systems  Constitutional: Negative for fever and unexpected weight change.  HENT: Negative for congestion, dental problem, ear pain, nosebleeds, postnasal drip, rhinorrhea, sinus pressure, sneezing, sore throat and trouble swallowing.   Eyes: Negative for redness and itching.  Respiratory: Negative for cough, chest tightness, shortness of breath and wheezing.   Cardiovascular: Negative for palpitations and leg swelling.  Gastrointestinal: Negative for nausea and vomiting.  Genitourinary: Negative for dysuria.  Musculoskeletal: Negative  for joint swelling.  Skin: Negative for rash.  Neurological: Negative for headaches.  Hematological: Does not bruise/bleed easily.  Psychiatric/Behavioral: Negative for dysphoric mood. The patient is not nervous/anxious.       Objective:   Physical Exam Filed Vitals:   09/18/14 1359  BP: 124/78  Pulse: 60  Height: 5\' 3"  (1.6 m)  Weight: 137 lb (62.143 kg)  SpO2: 96%   Gen: Pleasant, kyphotic elderly woman, in no distress,  normal affect  ENT: No lesions,  mouth clear,  oropharynx clear, no postnasal drip  Neck: No JVD, no TMG, no carotid bruits  Lungs: No use of accessory muscles, clear without rales or rhonchi  Cardiovascular: RRR, heart sounds normal, no murmur or gallops, no peripheral edema  Musculoskeletal: No deformities, no cyanosis or clubbing  Neuro: alert, non focal  Skin: Warm, no lesions or rashes   11/28/12 --  COMPARISON: Chest CT 05/10/2006. FINDINGS: Mediastinum: Heart size is normal. There is no significant pericardial fluid, thickening or pericardial calcification. There is atherosclerosis of the thoracic aorta, the great vessels of the mediastinum and the coronary arteries, including calcified atherosclerotic plaque in the left main, left circumflex and left anterior descending coronary arteries. No pathologically enlarged mediastinal or hilar lymph nodes. Please note that accurate exclusion of hilar adenopathy is limited on noncontrast CT scans. Small hiatal hernia.  Lungs/Pleura: No suspicious appearing pulmonary nodules or masses are identified at this time. No acute consolidative airspace disease. No pleural effusions. Mild cylindrical and varicose bronchiectasis in the medial  segment of the right middle lobe with some associated architectural distortion, compatible with post infectious or inflammatory scarring. Linear scarring in the posterior aspect of the right lower lobe adjacent to old rib fractures.  Upper Abdomen:  Unremarkable.  Musculoskeletal: Old healed fractures of the posterior aspects of the right 10th and 11th ribs. There are no aggressive appearing lytic or blastic lesions noted in the visualized portions of the skeleton. Old compression type fracture at T11 with approximately 25% loss of anterior vertebral body height, unchanged.  IMPRESSION: 1. No suspicious appearing pulmonary nodule or masses identified at this time to suggest underlying bronchogenic neoplasm. 2. Mild scarring in the right middle lobe and posterior aspect of the right lower lobe. 3. Atherosclerosis, including left main and 2 vessel coronary artery disease. Assessment for potential risk factor modification, dietary therapy or pharmacologic therapy may be warranted, if clinically indicated. 4. Small hiatal hernia.   12/10/13 --  Myocardial perfusion scan >> normal perfusion       Assessment & Plan:  SARCOIDOSIS, PULMONARY Not clear that she benefited significantly from Symbicort. She did not take it regularly on a schedule. She has had a single exacerbation but I suspect she is now back to baseline. I believe that we can continue albuterol when necessary and stop her scheduled bronchodilator. We will perform a CT scan of the chest in October to compare with 2014

## 2014-09-18 NOTE — Assessment & Plan Note (Signed)
Not clear that she benefited significantly from Symbicort. She did not take it regularly on a schedule. She has had a single exacerbation but I suspect she is now back to baseline. I believe that we can continue albuterol when necessary and stop her scheduled bronchodilator. We will perform a CT scan of the chest in October to compare with 2014

## 2014-09-18 NOTE — Addendum Note (Signed)
Addended by: Desmond Dike C on: 09/18/2014 02:28 PM   Modules accepted: Orders

## 2014-09-18 NOTE — Patient Instructions (Signed)
Please stop your Symbicort  Keep albuterol available to use 2 puffs as needed.  We will repeat your CT scan of the chest in October 2016.  Follow with Dr [name] in January 2017. vg

## 2014-10-02 ENCOUNTER — Encounter: Payer: Self-pay | Admitting: Sports Medicine

## 2014-10-02 ENCOUNTER — Encounter: Payer: Self-pay | Admitting: Internal Medicine

## 2014-10-02 ENCOUNTER — Ambulatory Visit (INDEPENDENT_AMBULATORY_CARE_PROVIDER_SITE_OTHER): Payer: Medicare Other | Admitting: Sports Medicine

## 2014-10-02 VITALS — BP 131/54 | HR 57 | Ht 63.0 in | Wt 135.0 lb

## 2014-10-02 DIAGNOSIS — M2170 Unequal limb length (acquired), unspecified site: Secondary | ICD-10-CM

## 2014-10-02 DIAGNOSIS — R269 Unspecified abnormalities of gait and mobility: Secondary | ICD-10-CM | POA: Diagnosis present

## 2014-10-02 NOTE — Assessment & Plan Note (Signed)
Custom orthotics have provide great relief to her pre-existing pain in her pain and hip  - additional heel lift was placed in left shoe, now with two heels lifts and orthotic in left shoe.  - she will continue only orthotic in right shoe   - she will f/u if she gets new shoes so adjustments can be made in her shoes.

## 2014-10-02 NOTE — Progress Notes (Signed)
  [name] - 80 y.o. female MRN [id number]  Date of birth: 05-Nov-1934   [name] is a 80 y.o. female who presents today for gait analysis.   She was placed in orthotics a few months ago for her leg length discrepancy.  This discrepancy was acquired for a left leg traumatic femur fracture. She was placed in a midfoot strap at the beginning of July and this has improved her symptoms.  She has additional heel pads placed in her left shoe and she was wondering if she needed more.  She denies any pain today.    PMHx - Updated and reviewed.  Contributory factors include: Scoliosis, HLD, GERD,  PSHx - Updated and reviewed.  Contributory factors include:  Close left subtrochanteric femur fracture s/p open/closed reduction and internal medullary fixation. Right total hip replacement,  Medications - Gabapentin, omeprazole    ROS Per HPI   Exam:  Filed Vitals:   10/02/14 1003  BP: 131/54  Pulse: 57   Gen: NAD Cardiorespiratory - Normal respiratory effort/rate.   MSK: Trendelenburg shift to the left still present.  Right foot has dysfunctional toes 4 and 5 and breakdown of the lateral column  Neurovascularly intact   Re-evaluation of gait after added heel lift Improvement of her previous Trendelenburg on left

## 2014-10-02 NOTE — Assessment & Plan Note (Signed)
We added additional lift theel  Thisclearly improved her gait in current orthoitcs and felt better

## 2014-10-03 ENCOUNTER — Ambulatory Visit (HOSPITAL_COMMUNITY)
Admission: RE | Admit: 2014-10-03 | Discharge: 2014-10-03 | Disposition: A | Discharge status: Home / Self Care | Payer: Medicare Other | Source: Ambulatory Visit | Attending: Internal Medicine | Admitting: Internal Medicine

## 2014-10-03 ENCOUNTER — Encounter (HOSPITAL_COMMUNITY): Payer: Self-pay

## 2014-10-03 DIAGNOSIS — M810 Age-related osteoporosis without current pathological fracture: Secondary | ICD-10-CM | POA: Insufficient documentation

## 2014-10-03 MED ORDER — DENOSUMAB 60 MG/ML ~~LOC~~ SOLN
60.0000 mg | Freq: Once | SUBCUTANEOUS | Status: AC
  Administered 2014-10-03: 60 mg via SUBCUTANEOUS
  Filled 2014-10-03: qty 1

## 2014-10-03 NOTE — Progress Notes (Signed)
Uneventful injection of Prolia. Pt discharged ambulatory unaccompanied

## 2014-10-03 NOTE — Discharge Instructions (Signed)
PROLIA °Denosumab injection °What is this medicine? °DENOSUMAB (den oh sue mab) slows bone breakdown. Prolia is used to treat osteoporosis in women after menopause and in men. Xgeva is used to prevent bone fractures and other bone problems caused by cancer bone metastases. Xgeva is also used to treat giant cell tumor of the bone. °This medicine may be used for other purposes; ask your health care provider or pharmacist if you have questions. °COMMON BRAND NAME(S): Prolia, XGEVA °What should I tell my health care provider before I take this medicine? °They need to know if you have any of these conditions: °-dental disease °-eczema °-infection or history of infections °-kidney disease or on dialysis °-low blood calcium or vitamin D °-malabsorption syndrome °-scheduled to have surgery or tooth extraction °-taking medicine that contains denosumab °-thyroid or parathyroid disease °-an unusual reaction to denosumab, other medicines, foods, dyes, or preservatives °-pregnant or trying to get pregnant °-breast-feeding °How should I use this medicine? °This medicine is for injection under the skin. It is given by a health care professional in a hospital or clinic setting. °If you are getting Prolia, a special MedGuide will be given to you by the pharmacist with each prescription and refill. Be sure to read this information carefully each time. °For Prolia, talk to your pediatrician regarding the use of this medicine in children. Special care may be needed. For Xgeva, talk to your pediatrician regarding the use of this medicine in children. While this drug may be prescribed for children as young as 13 years for selected conditions, precautions do apply. °Overdosage: If you think you've taken too much of this medicine contact a poison control center or emergency room at once. °Overdosage: If you think you have taken too much of this medicine contact a poison control center or emergency room at once. °NOTE: This medicine is only  for you. Do not share this medicine with others. °What if I miss a dose? °It is important not to miss your dose. Call your doctor or health care professional if you are unable to keep an appointment. °What may interact with this medicine? °Do not take this medicine with any of the following medications: °-other medicines containing denosumab °This medicine may also interact with the following medications: °-medicines that suppress the immune system °-medicines that treat cancer °-steroid medicines like prednisone or cortisone °This list may not describe all possible interactions. Give your health care provider a list of all the medicines, herbs, non-prescription drugs, or dietary supplements you use. Also tell them if you smoke, drink alcohol, or use illegal drugs. Some items may interact with your medicine. °What should I watch for while using this medicine? °Visit your doctor or health care professional for regular checks on your progress. Your doctor or health care professional may order blood tests and other tests to see how you are doing. °Call your doctor or health care professional if you get a cold or other infection while receiving this medicine. Do not treat yourself. This medicine may decrease your body's ability to fight infection. °You should make sure you get enough calcium and vitamin D while you are taking this medicine, unless your doctor tells you not to. Discuss the foods you eat and the vitamins you take with your health care professional. °See your dentist regularly. Brush and floss your teeth as directed. Before you have any dental work done, tell your dentist you are receiving this medicine. °Do not become pregnant while taking this medicine or for 5 months after   stopping it. Women should inform their doctor if they wish to become pregnant or think they might be pregnant. There is a potential for serious side effects to an unborn child. Talk to your health care professional or pharmacist for  more information. °What side effects may I notice from receiving this medicine? °Side effects that you should report to your doctor or health care professional as soon as possible: °-allergic reactions like skin rash, itching or hives, swelling of the face, lips, or tongue °-breathing problems °-chest pain °-fast, irregular heartbeat °-feeling faint or lightheaded, falls °-fever, chills, or any other sign of infection °-muscle spasms, tightening, or twitches °-numbness or tingling °-skin blisters or bumps, or is dry, peels, or red °-slow healing or unexplained pain in the mouth or jaw °-unusual bleeding or bruising °Side effects that usually do not require medical attention (Report these to your doctor or health care professional if they continue or are bothersome.): °-muscle pain °-stomach upset, gas °This list may not describe all possible side effects. Call your doctor for medical advice about side effects. You may report side effects to FDA at 1-800-FDA-1088. °Where should I keep my medicine? °This medicine is only given in a clinic, doctor's office, or other health care setting and will not be stored at home. °NOTE: This sheet is a summary. It may not cover all possible information. If you have questions about this medicine, talk to your doctor, pharmacist, or health care provider. °© 2015, [employer]. (2011-08-08 12:37:47) °Osteoporosis °Throughout your life, your body breaks down old bone and replaces it with new bone. As you get older, your body does not replace bone as quickly as it breaks it down. By the age of 30 years, most people begin to gradually lose bone because of the imbalance between bone loss and replacement. Some people lose more bone than others. Bone loss beyond a specified normal degree is considered osteoporosis.  °Osteoporosis affects the strength and durability of your bones. The inside of the ends of your bones and your flat bones, like the bones of your pelvis, look like  honeycomb, filled with tiny open spaces. As bone loss occurs, your bones become less dense. This means that the open spaces inside your bones become bigger and the walls between these spaces become thinner. This makes your bones weaker. Bones of a person with osteoporosis can become so weak that they can break (fracture) during minor accidents, such as a simple fall. °CAUSES  °The following factors have been associated with the development of osteoporosis: °· Smoking. °· Drinking more than 2 alcoholic drinks several days per week. °· Long-term use of certain medicines: °¨ Corticosteroids. °¨ Chemotherapy medicines. °¨ Thyroid medicines. °¨ Antiepileptic medicines. °¨ Gonadal hormone suppression medicine. °¨ Immunosuppression medicine. °· Being underweight. °· Lack of physical activity. °· Lack of exposure to the sun. This can lead to vitamin D deficiency. °· Certain medical conditions: °¨ Certain inflammatory bowel diseases, such as Crohn disease and ulcerative colitis. °¨ Diabetes. °¨ Hyperthyroidism. °¨ Hyperparathyroidism. °RISK FACTORS °Anyone can develop osteoporosis. However, the following factors can increase your risk of developing osteoporosis: °· Gender--Women are at higher risk than men. °· Age--Being older than 50 years increases your risk. °· Ethnicity--White and Asian people have an increased risk. °· Weight --Being extremely underweight can increase your risk of osteoporosis. °· Family history of osteoporosis--Having a family member who has developed osteoporosis can increase your risk. °SYMPTOMS  °Usually, people with osteoporosis have no symptoms.  °DIAGNOSIS  °Signs during   a physical exam that may prompt your caregiver to suspect osteoporosis include: °· Decreased height. This is usually caused by the compression of the bones that form your spine (vertebrae) because they have weakened and become fractured. °· A curving or rounding of the upper back (kyphosis). °To confirm signs of osteoporosis,  your caregiver may request a procedure that uses 2 low-dose X-ray beams with different levels of energy to measure your bone mineral density (dual-energy X-ray absorptiometry [DXA]). Also, your caregiver may check your level of vitamin D. °TREATMENT  °The goal of osteoporosis treatment is to strengthen bones in order to decrease the risk of bone fractures. There are different types of medicines available to help achieve this goal. Some of these medicines work by slowing the processes of bone loss. Some medicines work by increasing bone density. Treatment also involves making sure that your levels of calcium and vitamin D are adequate. °PREVENTION  °There are things you can do to help prevent osteoporosis. Adequate intake of calcium and vitamin D can help you achieve optimal bone mineral density. Regular exercise can also help, especially resistance and weight-bearing activities. If you smoke, quitting smoking is an important part of osteoporosis prevention. °MAKE SURE YOU: °· Understand these instructions. °· Will watch your condition. °· Will get help right away if you are not doing well or get worse. °FOR MORE INFORMATION °www.osteo.org and www.nof.org °Document Released: 11/17/2004 Document Revised: 06/04/2012 Document Reviewed: 01/22/2011 °[employer]® Patient Information ©2015 [employer]. This information is not intended to replace advice given to you by your health care provider. Make sure you discuss any questions you have with your health care provider. ° ° °

## 2014-10-22 ENCOUNTER — Encounter (HOSPITAL_COMMUNITY): Payer: Medicare Other

## 2014-12-02 ENCOUNTER — Ambulatory Visit (INDEPENDENT_AMBULATORY_CARE_PROVIDER_SITE_OTHER)
Admission: RE | Admit: 2014-12-02 | Discharge: 2014-12-02 | Disposition: A | Discharge status: Home / Self Care | Payer: Medicare Other | Source: Ambulatory Visit | Attending: Emergency Medicine | Admitting: Emergency Medicine

## 2014-12-02 DIAGNOSIS — D869 Sarcoidosis, unspecified: Secondary | ICD-10-CM | POA: Diagnosis not present

## 2014-12-04 ENCOUNTER — Telehealth: Payer: Self-pay | Admitting: Emergency Medicine

## 2014-12-04 NOTE — Telephone Encounter (Signed)
Last seen on by [facility] and spoke with pt. She stated she was calling to get result of CT chest done on 10.11.16. I informed her that once RB sends a result message that we would contact her and review his results and recs. She voiced understanding and had no further questions. RB please advise on results.

## 2014-12-09 NOTE — Telephone Encounter (Signed)
Please let the pt know that there is no scarring in the lungs. There are some chronic changes that are stable compared with her scans. Her sarcoidosis and her airways appear to be unchanged.

## 2014-12-10 NOTE — Telephone Encounter (Signed)
Spoke with pt and notified of results per Dr. Byrum. Pt verbalized understanding and denied any questions.  

## 2014-12-10 NOTE — Telephone Encounter (Signed)
lmtcb x1 for pt. 

## 2014-12-11 ENCOUNTER — Ambulatory Visit: Payer: Medicare Other | Admitting: Nurse Practitioner

## 2014-12-17 ENCOUNTER — Ambulatory Visit: Payer: Medicare Other | Admitting: Nurse Practitioner

## 2014-12-18 ENCOUNTER — Ambulatory Visit: Payer: Medicare Other | Admitting: Nurse Practitioner

## 2014-12-22 NOTE — Progress Notes (Signed)
Cardiology Office Note   Date:  12/23/2014   ID:  [name], DOB [birth year], MRN [id number]  PCP:  [name], MD  Cardiologist:  Dr. Loralie Champagne   Electrophysiologist:  n/a  Chief Complaint  Patient presents with  . Follow-up     History of Present Illness: [name] is a 80 y.o. female with a hx of pulmonary sarcoidosis, HTN, HL, SVT.  Prior patient of Dr.  Bing Quarry.  Established with Dr. Loralie Champagne in 2014.  Last seen in clinic with Sherri Form, PA-C 10/15.  She noted worsening DOE.  Echo demonstrated normal LVF and RVF and a nuclear stress test was low risk and neg for ischemia.    She recently underwent FU high resolution CT which demonstrates changes likely c/w sarcoidosis, bronchiectasis as well as 3 v calcification including the LM.  She was asked to FU with cardiology.  She tells me that she has been chronically short of breath. She feels like her breathing has gradually gotten worse.  She also notes decreased exercise tolerance.  This also seems to be chronic.  She denies chest discomfort.  She has occasional jaw and arm pain but this does not seem to be related to activity.  She denies syncope.  She denies orthopnea, PND, edema.     Studies/Reports Reviewed Today:  High Resolution CT 12/02/14 IMPRESSION: 1. No findings to suggest interstitial lung disease. 2. The overall appearance the chest is very similar to the prior study 11/28/2012. Findings are nonspecific, but could be seen in the setting of reported sarcoidosis, as detailed above. 3. Areas of bronchiectasis are noted in the right lung, most severe in the medial segment of the right middle lobe where there are areas of cystic bronchiectasis and architectural distortion. 4. Atherosclerosis, including left main and 3 vessel coronary artery disease. Assessment for potential risk factor modification, dietary therapy or pharmacologic therapy may be warranted, if clinically  indicated. 5. Additional incidental findings, as above.   Myoview 10/15 Normal stress nuclear study.  LV Ejection Fraction: 70%.  Echo 10/15 EF 60-65%, no RWMA  Carotid US 9/15 Bilateral 1-39% ICA >> FU 2 years   Past Medical History  Diagnosis Date  . Paroxysmal supraventricular tachycardia ([place])   . Other chronic pulmonary heart diseases   . Other and unspecified hyperlipidemia   . Other symptoms involving cardiovascular system   . Personal history of other diseases of digestive system   . Sarcoidosis ([place])   . Esophageal reflux   . Allergy     SEASONAL  . Arthritis   . Osteoporosis   . Carotid bruit   . Hematochezia   . Hx of colonoscopy   . Atherosclerotic heart disease of native coronary artery with other forms of angina pectoris ([place])   1. Hyperlipidemia 2. Sarcoidosis 3. H/o SVT 4. Carotid bruits: carotid dopplers (9/13) with minimal carotid stenosis.  5. Echo (10/10) with EF 60-65%, mild MR.  6. Femur fracture in 6/14 s/p repair.    Past Surgical History  Procedure Laterality Date  . Trapezium resection Right   . Carpal tunnel release Left   . Shoulder arthroscopy w/ rotator cuff repair Right   . Foot surgery Right   . Bladder surgery    . Arm surgery Right   . Colonoscopy    . Polypectomy    . Hip surgery Right 2011    full replacement  . Leg surgery Left May 2014    femur fracture s/p open  and closed reduction in [place], Dr. Jimmye Norman     Current Outpatient Prescriptions  Medication Sig Dispense Refill  . albuterol (PROVENTIL HFA;VENTOLIN HFA) 108 (90 BASE) MCG/ACT inhaler Inhale 2 puffs into the lungs every 4 (four) hours as needed for wheezing or shortness of breath. 1 Inhaler 6  . diltiazem (CARDIZEM CD) 180 MG 24 hr capsule Take 180 mg by mouth daily.      . fluticasone (FLONASE) 50 MCG/ACT nasal spray Place 1 spray into both nostrils daily.     Marland Kitchen gabapentin (NEURONTIN) 100 MG capsule Take 100 mg by mouth daily.     . meloxicam (MOBIC) 15  MG tablet Take 15 mg by mouth as needed for pain.     Marland Kitchen MYRBETRIQ 50 MG TB24 tablet Take 50 mg by mouth daily.     Marland Kitchen omeprazole (PRILOSEC) 40 MG capsule Take 40 mg by mouth daily.     . traMADol (ULTRAM) 50 MG tablet Take 50 mg by mouth every 6 (six) hours as needed for severe pain.     . Vitamin D, Ergocalciferol, (DRISDOL) 50000 UNITS CAPS Take 50,000 Units by mouth every 7 (seven) days.     Marland Kitchen aspirin EC 81 MG tablet Take 1 tablet (81 mg total) by mouth daily.    Marland Kitchen atorvastatin (LIPITOR) 40 MG tablet Take 1 tablet (40 mg total) by mouth daily. 90 tablet 3   No current facility-administered medications for this visit.    Allergies:   Morphine; Oxycodone-acetaminophen; Percocet; and Sulfonamide derivatives    Social History:   Social History   Social History  . Marital Status: Widowed    Spouse Name: N/A  . Number of Children: 1  . Years of Education: N/A   Occupational History  . retired     still works as Oceanographer  .     Social History Main Topics  . Smoking status: Never Smoker   . Smokeless tobacco: Never Used  . Alcohol Use: No  . Drug Use: No  . Sexual Activity: No   Other Topics Concern  . None   Social History Narrative     Family History:   Family History  Problem Relation Age of Onset  . Colon cancer Mother 5  . Lung cancer Father   . Heart disease Father   . Esophageal cancer Neg Hx   . Rectal cancer Neg Hx   . Stomach cancer Neg Hx   . Heart attack Neg Hx   . Stroke Neg Hx   . Arrhythmia Father   . Cancer Mother   . Cancer Father       ROS:   Please see the history of present illness.   Review of Systems  Cardiovascular: Positive for dyspnea on exertion.  All other systems reviewed and are negative.    PHYSICAL EXAM: VS:  BP 138/60 mmHg  Pulse 72  Ht 5\' 3"  (1.6 m)  Wt 133 lb 9.6 oz (60.601 kg)  BMI 23.67 kg/m2    Wt Readings from Last 3 Encounters:  12/23/14 133 lb 9.6 oz (60.601 kg)  10/03/14 135 lb (61.236 kg)   10/02/14 135 lb (61.236 kg)     GEN: Well nourished, well developed, in no acute distress HEENT: normal Neck: no JVD,  no masses Cardiac:  Normal S1/S2, RRR; no murmur ,  no rubs or gallops, no edema   Respiratory:  clear to auscultation bilaterally, no wheezing, rhonchi or rales. GI: soft, nontender, nondistended, + BS MS: no deformity  or atrophy Skin: warm and dry  Neuro:  CNs II-XII intact, Strength and sensation are intact Psych: Normal affect   EKG:  EKG is ordered today.  It demonstrates:   NSR, HR 72, QTc 440 ms   Recent Labs: No results found for requested labs within last 365 days.  11/17/14 (PCPs office): Potassium 4.5, ALT 10, creatinine 0.8, Hgb 13.6, TC 178, HDL 73, LDL 91, TG 70  Lipid Panel No results found for: CHOL, TRIG, HDL, CHOLHDL, VLDL, LDLCALC, LDLDIRECT    ASSESSMENT AND PLAN:  1. CAD:  She has 3v coronary artery calcification on recent high resolution CT done for FU on sarcoidosis.  She has chronic DOE and this does not seem to be progressively worsening.  She has other reasons to be SOB in light of her hx of Sarcoidosis and bronchiectasis.  I reviewed her images on the CT scan with Dr. Liam Rogers (DOD) today.  We will start with an exercise myoview to rule out ischemia.  I have asked that we try to walk her on a modified Bruce protocol to document her BP response to exercise.  If she cannot tolerate, then we can change to [place].  Will try to treat Lipids more aggressively.  Change to Atorvastatin.  Start ASA 81 mg QD.   2. PSVT:  No apparent recurrence.  Continue calcium channel blocker.  3. HTN:  Controlled.   4. Hyperlipidemia:  With CAD noted on CT, would recommend aggressive control of lipids and try to get LDL < 70.  DC Pravastatin.  Start Atorvastatin 40 mg QD.  Check Lipids and LFTs in 6 weeks.    5. Pulmonary Sarcoidosis:  FU with Pulmonology as planned.   6. Carotid Stenosis:  FU Carotid US due in 10/2015.     Medication  Changes: Current medicines are reviewed at length with the patient today.  Concerns regarding medicines are as outlined above.  The following changes have been made:   Discontinued Medications   OXYBUTYNIN (DITROPAN XL) 5 MG 24 HR TABLET    Take 1 tablet (5 mg total) by mouth at bedtime.   PRAVASTATIN (PRAVACHOL) 40 MG TABLET    Take 40 mg by mouth daily.   Modified Medications   No medications on file   New Prescriptions   ASPIRIN EC 81 MG TABLET    Take 1 tablet (81 mg total) by mouth daily.   ATORVASTATIN (LIPITOR) 40 MG TABLET    Take 1 tablet (40 mg total) by mouth daily.   Labs/ tests ordered today include:   Orders Placed This Encounter  Procedures  . Lipid Profile  . Hepatic function panel  . Myocardial Perfusion Imaging  . EKG 12-Lead      Disposition:    FU with Dr. Loralie Champagne 6 mos or sooner if she has chest pain.    Signed, Versie Starks, MHS 12/23/2014 1:34 PM    [facility] [address], [place], [place]  [address] Phone: [phone]; Fax: [phone]

## 2014-12-23 ENCOUNTER — Encounter: Payer: Self-pay | Admitting: Physician Assistant

## 2014-12-23 ENCOUNTER — Ambulatory Visit (INDEPENDENT_AMBULATORY_CARE_PROVIDER_SITE_OTHER): Payer: Medicare Other | Admitting: Physician Assistant

## 2014-12-23 VITALS — BP 138/60 | HR 72 | Ht 63.0 in | Wt 133.6 lb

## 2014-12-23 DIAGNOSIS — E785 Hyperlipidemia, unspecified: Secondary | ICD-10-CM | POA: Diagnosis not present

## 2014-12-23 DIAGNOSIS — I471 Supraventricular tachycardia: Secondary | ICD-10-CM

## 2014-12-23 DIAGNOSIS — R0602 Shortness of breath: Secondary | ICD-10-CM | POA: Diagnosis not present

## 2014-12-23 DIAGNOSIS — I2510 Atherosclerotic heart disease of native coronary artery without angina pectoris: Secondary | ICD-10-CM

## 2014-12-23 DIAGNOSIS — I6523 Occlusion and stenosis of bilateral carotid arteries: Secondary | ICD-10-CM

## 2014-12-23 DIAGNOSIS — I10 Essential (primary) hypertension: Secondary | ICD-10-CM | POA: Diagnosis not present

## 2014-12-23 DIAGNOSIS — D869 Sarcoidosis, unspecified: Secondary | ICD-10-CM

## 2014-12-23 MED ORDER — ATORVASTATIN CALCIUM 40 MG PO TABS: 40.0000 mg | ORAL_TABLET | Freq: Every day | ORAL | Status: DC

## 2014-12-23 MED ORDER — ASPIRIN EC 81 MG PO TBEC: 81.0000 mg | DELAYED_RELEASE_TABLET | Freq: Every day | ORAL | Status: DC

## 2014-12-23 NOTE — Patient Instructions (Signed)
Medication Instructions:  1. STOP PRAVASTATIN  2. START ATORVASTATIN 40 MG DAILY  3. START ASPIRIN 81 MG DAILY  Labwork: 6-8 WEEK FOR FASTING LIPID AND LIVER PANEL   Testing/Procedures: Your physician has requested that you have en exercise stress myoview PER [name] WEAVER, PAC OK TO MODIFY TO LEXISCAN IF PT NOT ABLE TO WALK DUE TO HIP PROBLEMS; PA WOULD LIKE PT TO START OF WALKING THOUGH TO SEE WHAT BLOOD PRESSURE DOES WITH EXERCISE. Marland Kitchen For further information please visit [email or web address]. Please follow instruction sheet, as given.  Follow-Up: 6 MONTHS WITH DR. Aundra Dubin; WE WILL CALL YOU A COUPLE OF MONTHS EARLIER TO SCHEDULE YOUR APPT.  Any Other Special Instructions Will Be Listed Below (If Applicable).     If you need a refill on your cardiac medications before your next appointment, please call your pharmacy.

## 2014-12-31 ENCOUNTER — Telehealth (HOSPITAL_COMMUNITY): Payer: Self-pay | Admitting: *Deleted

## 2014-12-31 NOTE — Telephone Encounter (Signed)
Patient given detailed instructions per Myocardial Perfusion Study Information Sheet for the test on 01/02/15 at 815. Patient notified to arrive 15 minutes early and that it is imperative to arrive on time for appointment to keep from having the test rescheduled.  If you need to cancel or reschedule your appointment, please call the office within 24 hours of your appointment. Failure to do so may result in a cancellation of your appointment, and a $50 no show fee. Patient verbalized understanding.Hubbard Robinson, RN

## 2015-01-02 ENCOUNTER — Ambulatory Visit (HOSPITAL_COMMUNITY): Payer: Medicare Other | Attending: Physician Assistant

## 2015-01-02 DIAGNOSIS — R002 Palpitations: Secondary | ICD-10-CM | POA: Insufficient documentation

## 2015-01-02 DIAGNOSIS — R0609 Other forms of dyspnea: Secondary | ICD-10-CM | POA: Diagnosis not present

## 2015-01-02 DIAGNOSIS — I2510 Atherosclerotic heart disease of native coronary artery without angina pectoris: Secondary | ICD-10-CM | POA: Diagnosis not present

## 2015-01-02 DIAGNOSIS — R0602 Shortness of breath: Secondary | ICD-10-CM

## 2015-01-02 LAB — MYOCARDIAL PERFUSION IMAGING
CHL CUP NUCLEAR SRS: 3
CSEPPHR: 85 {beats}/min
LVDIAVOL: 63 mL
LVSYSVOL: 16 mL
RATE: 0.22
Rest HR: 69 {beats}/min
SDS: 8
SSS: 11
TID: 1.01

## 2015-01-02 MED ORDER — REGADENOSON 0.4 MG/5ML IV SOLN
0.4000 mg | Freq: Once | INTRAVENOUS | Status: AC
  Administered 2015-01-02: 0.4 mg via INTRAVENOUS

## 2015-01-02 MED ORDER — TECHNETIUM TC 99M SESTAMIBI GENERIC - CARDIOLITE
30.0000 | Freq: Once | INTRAVENOUS | Status: AC | PRN
  Administered 2015-01-02: 30 via INTRAVENOUS

## 2015-01-02 MED ORDER — TECHNETIUM TC 99M SESTAMIBI GENERIC - CARDIOLITE
10.9000 | Freq: Once | INTRAVENOUS | Status: AC | PRN
Start: 2015-01-02 — End: 2015-01-02
  Administered 2015-01-02: 10.9 via INTRAVENOUS

## 2015-01-05 ENCOUNTER — Encounter: Payer: Self-pay | Admitting: Physician Assistant

## 2015-01-13 ENCOUNTER — Ambulatory Visit: Payer: Medicare Other | Admitting: Nurse Practitioner

## 2015-02-04 ENCOUNTER — Ambulatory Visit (INDEPENDENT_AMBULATORY_CARE_PROVIDER_SITE_OTHER): Payer: Medicare Other | Admitting: Nurse Practitioner

## 2015-02-04 ENCOUNTER — Encounter: Payer: Self-pay | Admitting: Nurse Practitioner

## 2015-02-04 VITALS — BP 122/64 | HR 76 | Ht 62.5 in | Wt 128.0 lb

## 2015-02-04 DIAGNOSIS — N3281 Overactive bladder: Secondary | ICD-10-CM | POA: Diagnosis not present

## 2015-02-04 DIAGNOSIS — D869 Sarcoidosis, unspecified: Secondary | ICD-10-CM

## 2015-02-04 DIAGNOSIS — Z124 Encounter for screening for malignant neoplasm of cervix: Secondary | ICD-10-CM

## 2015-02-04 DIAGNOSIS — Z01419 Encounter for gynecological examination (general) (routine) without abnormal findings: Secondary | ICD-10-CM | POA: Diagnosis not present

## 2015-02-04 MED ORDER — TRAZODONE HCL 50 MG PO TABS: 50.0000 mg | ORAL_TABLET | Freq: Every day | ORAL | Status: DC

## 2015-02-04 NOTE — Patient Instructions (Signed)

## 2015-02-04 NOTE — Progress Notes (Signed)
Patient ID: [name], female   DOB: 03-27-1934, 80 y.o.   MRN: [id number]  80 y.o. G54P1001 Widowed  Caucasian Fe here for annual exam.  She had a dose of Prolia this past 6 months and will repeat again in 03/2015.  She feels well except for not able to sleep well.  No other major health issues this past year.  No LMP recorded. Patient is postmenopausal.          Sexually active: No.  The current method of family planning is abstinence.    Exercising: No.  The patient does not participate in regular exercise at present. Smoker:  no  Health Maintenance: Pap:12/02/13, Negative  MMG:06/16/14, 3D, Bi-Rads 1: Negative Colonoscopy: 09/18/13 1 polyps removed, WNL BMD:05/07/13, T Score -3.8 Left Forearm Total / 13.5 Right Forearm Total / 0.3 Spine Total (hips not calculated due to hardware) TDaP:01/14/12 Shingles: 11/08/13 Pneumonia: 02/25/14 (Prevnar 13) Hep C and HIV: N/A due to age Labs: Dr. Dagmar Hait, pt brought copies   reports that she has never smoked. She has never used smokeless tobacco. She reports that she does not drink alcohol or use illicit drugs.  Past Medical History  Diagnosis Date  . Paroxysmal supraventricular tachycardia ([place])   . Other chronic pulmonary heart diseases   . Other and unspecified hyperlipidemia   . Other symptoms involving cardiovascular system   . Personal history of other diseases of digestive system   . Sarcoidosis ([place])   . Esophageal reflux   . Allergy     SEASONAL  . Arthritis   . Osteoporosis   . Carotid bruit   . Hematochezia   . Hx of colonoscopy   . Coronary artery calcification seen on CAT scan     a. Myoview 11/16: EF 75%, normal perfusion, (there was no blood pressure drop at low level exercise with Lexiscan infusion), low risk study    Past Surgical History  Procedure Laterality Date  . Trapezium resection Right   . Carpal tunnel release Left   . Shoulder arthroscopy w/ rotator cuff repair Right   . Foot surgery Right   . Bladder  surgery    . Arm surgery Right   . Colonoscopy    . Polypectomy    . Hip surgery Right 2011    full replacement  . Leg surgery Left May 2014    femur fracture s/p open and closed reduction in [place], Dr. Jimmye Norman    Current Outpatient Prescriptions  Medication Sig Dispense Refill  . albuterol (PROVENTIL HFA;VENTOLIN HFA) 108 (90 BASE) MCG/ACT inhaler Inhale 2 puffs into the lungs every 4 (four) hours as needed for wheezing or shortness of breath. 1 Inhaler 6  . aspirin EC 81 MG tablet Take 1 tablet (81 mg total) by mouth daily.    Marland Kitchen atorvastatin (LIPITOR) 40 MG tablet Take 1 tablet (40 mg total) by mouth daily. 90 tablet 3  . diltiazem (CARDIZEM CD) 180 MG 24 hr capsule Take 180 mg by mouth daily.      . meloxicam (MOBIC) 15 MG tablet Take 15 mg by mouth as needed for pain.     Marland Kitchen MYRBETRIQ 50 MG TB24 tablet Take 50 mg by mouth daily.     Marland Kitchen omeprazole (PRILOSEC) 40 MG capsule Take 40 mg by mouth daily.     . traMADol (ULTRAM) 50 MG tablet Take 50 mg by mouth every 6 (six) hours as needed for severe pain.     . Vitamin D, Ergocalciferol, (DRISDOL) 50000 UNITS  CAPS Take 50,000 Units by mouth every 7 (seven) days.     . traZODone (DESYREL) 50 MG tablet Take 1 tablet (50 mg total) by mouth at bedtime. 90 tablet 0   No current facility-administered medications for this visit.    Family History  Problem Relation Age of Onset  . Colon cancer Mother 65  . Lung cancer Father   . Heart disease Father   . Esophageal cancer Neg Hx   . Rectal cancer Neg Hx   . Stomach cancer Neg Hx   . Heart attack Neg Hx   . Stroke Neg Hx   . Arrhythmia Father   . Cancer Mother   . Cancer Father     ROS:  Pertinent items are noted in HPI.  Otherwise, a comprehensive ROS was negative.  Exam:   BP 122/64 mmHg  Pulse 76  Ht 5' 2.5" (1.588 m)  Wt 128 lb (58.06 kg)  BMI 23.02 kg/m2 Height: 5' 2.5" (158.8 cm) Ht Readings from Last 3 Encounters:  02/04/15 5' 2.5" (1.588 m)  12/23/14 5\' 3"  (1.6 m)   10/03/14 5\' 3"  (1.6 m)    General appearance: alert, cooperative and appears stated age Head: Normocephalic, without obvious abnormality, atraumatic Neck: no adenopathy, supple, symmetrical, trachea midline and thyroid normal to inspection and palpation Lungs: clear to auscultation bilaterally Breasts: normal appearance, no masses or tenderness Heart: regular rate and rhythm Abdomen: soft, non-tender; no masses,  no organomegaly Extremities: extremities normal, atraumatic, no cyanosis or edema Skin: Skin color, texture, turgor normal. No rashes or lesions Lymph nodes: Cervical, supraclavicular, and axillary nodes normal. No abnormal inguinal nodes palpated Neurologic: Grossly normal   Pelvic: External genitalia:  no lesions              Urethra:  normal appearing urethra with no masses, tenderness or lesions              Bartholin's and Skene's: normal                 Vagina: normal appearing vagina with normal color and discharge, no lesions              Cervix: anteverted              Pap taken: No. Bimanual Exam:  Uterus:  normal size, contour, position, consistency, mobility, non-tender              Adnexa: no mass, fullness, tenderness               Rectovaginal: Confirms               Anus:  normal sphincter tone, no lesions  Chaperone present: no  A:  Well Woman with normal exam  Osteoporosis on Prolia per PCP  Recent atraumatic fracture of left femur with pinning 07/19/12  Urethral prolapse secondary to atrophy and OAB -better with Myrbetric  Past history of Sarcoidosis   P:   Reviewed health and wellness pertinent to exam  Pap smear as above  Mammogram is due 05/2015  Will give her a trial of Trazodone 50 mg at hs and see if sleep quality is better  Counseled on breast self exam, mammography screening, adequate intake of calcium and vitamin D, diet and exercise return annually or prn  An After Visit Summary was printed and  given to the patient.

## 2015-02-09 NOTE — Progress Notes (Signed)
Encounter reviewed Jill Jertson, MD   

## 2015-02-10 ENCOUNTER — Other Ambulatory Visit (INDEPENDENT_AMBULATORY_CARE_PROVIDER_SITE_OTHER): Payer: Medicare Other | Admitting: *Deleted

## 2015-02-10 DIAGNOSIS — I2510 Atherosclerotic heart disease of native coronary artery without angina pectoris: Secondary | ICD-10-CM

## 2015-02-10 LAB — HEPATIC FUNCTION PANEL
ALT: 17 U/L (ref 6–29)
AST: 21 U/L (ref 10–35)
Albumin: 4 g/dL (ref 3.6–5.1)
Alkaline Phosphatase: 75 U/L (ref 33–130)
BILIRUBIN DIRECT: 0.1 mg/dL (ref <=0.2)
BILIRUBIN TOTAL: 0.5 mg/dL (ref 0.2–1.2)
Indirect Bilirubin: 0.4 mg/dL (ref 0.2–1.2)
Total Protein: 6.4 g/dL (ref 6.1–8.1)

## 2015-02-10 LAB — LIPID PANEL
Cholesterol: 157 mg/dL (ref 125–200)
HDL: 67 mg/dL (ref >=46)
LDL Cholesterol: 77 mg/dL (ref <130)
Total CHOL/HDL Ratio: 2.3 Ratio (ref <=5.0)
Triglycerides: 66 mg/dL (ref <150)
VLDL: 13 mg/dL (ref <30)

## 2015-03-19 ENCOUNTER — Encounter: Payer: Self-pay | Admitting: Emergency Medicine

## 2015-03-19 ENCOUNTER — Ambulatory Visit (INDEPENDENT_AMBULATORY_CARE_PROVIDER_SITE_OTHER): Payer: Medicare Other | Admitting: Emergency Medicine

## 2015-03-19 VITALS — BP 130/70 | HR 74 | Ht 63.0 in | Wt 130.0 lb

## 2015-03-19 DIAGNOSIS — R059 Cough, unspecified: Secondary | ICD-10-CM | POA: Insufficient documentation

## 2015-03-19 DIAGNOSIS — D869 Sarcoidosis, unspecified: Secondary | ICD-10-CM | POA: Diagnosis not present

## 2015-03-19 DIAGNOSIS — R05 Cough: Secondary | ICD-10-CM | POA: Diagnosis not present

## 2015-03-19 MED ORDER — FLUTICASONE PROPIONATE 50 MCG/ACT NA SUSP: 2.0000 | Freq: Every day | NASAL | Status: DC

## 2015-03-19 NOTE — Progress Notes (Signed)
Subjective:    Patient ID: [name], female    DOB: [birth year], 81 y.o.   MRN: [id number]  HPI 81 yo woman, never smoker, hx sarcoidosis that was dx by Dr Lucia Gaskins in [place] after FOB (? Whether she had biopsies or not), PSVT, GERD. She also had a hx severe PNA with empyema and chest tube (90's). She has DOE and difficulty walking. The decrease in function has come on gradually. Her cardiac stress test was negative as below. PFT show obstruction with a DLCO defect.   PFT 12/06/13 >> severe AFL without BD response.   ROV 02/25/14 -- follow up for progressive dyspnea in setting sarcoidosis. Last time we decided to initiate short acting beta agonist to see if she would benefit. Walking oximetry was reassuring. She has been doing a bit better - she believes that she does better now that the hot weather is passed. She used SABA a few times and felt that it helped her some. She is asking about pneumonia vaccine.   ROV 09/18/14 -- follow-up visit for history of sarcoidosis. Her last visit we started Symbicort to see if she would benefit.  She used the symbicort about qd, started to forget it. She isn't sure that she felt better on it. She reports that she had an acute flare in June, followed a trip to [place]. She required treatment with pred and abx. She has albuterol that she uses prn, very rarely. She is otherwise well, no pulm limitations at baseline.   ROV1/26/17 -- patient has history of sarcoidosis, severe obstructive lung disease. Also a history of a prior empyema that required chest use drainage. She underwent a repeat CT scan of her chest in October 2016 that confirmed some areas of bronchiectasis and architectural distortion in the right middle lobe and elsewhere. There was no interstitial disease and very little change compared with 2014.   She stopped Symbicort ;last visit, has not really missed it. She is breathing well, but is having problems with nasal gtt, R ear fullness, a hacking cough. She  moved end of October - new exposures, new home.  Has not needed SABA.  Review of Systems  Constitutional: Negative for fever and unexpected weight change.  HENT: Negative for congestion, dental problem, ear pain, nosebleeds, postnasal drip, rhinorrhea, sinus pressure, sneezing, sore throat and trouble swallowing.   Eyes: Negative for redness and itching.  Respiratory: Negative for cough, chest tightness, shortness of breath and wheezing.   Cardiovascular: Negative for palpitations and leg swelling.  Gastrointestinal: Negative for nausea and vomiting.  Genitourinary: Negative for dysuria.  Musculoskeletal: Negative for joint swelling.  Skin: Negative for rash.  Neurological: Negative for headaches.  Hematological: Does not bruise/bleed easily.  Psychiatric/Behavioral: Negative for dysphoric mood. The patient is not nervous/anxious.       Objective:   Physical Exam Filed Vitals:   03/19/15 1327  BP: 130/70  Pulse: 74  Height: 5\' 3"  (1.6 m)  Weight: 130 lb (58.968 kg)  SpO2: 95%   Gen: Pleasant, kyphotic elderly woman, in no distress,  normal affect  ENT: No lesions,  mouth clear,  oropharynx clear, no postnasal drip  Neck: No JVD, no TMG, no carotid bruits  Lungs: No use of accessory muscles, clear without rales or rhonchi  Cardiovascular: RRR, heart sounds normal, no murmur or gallops, no peripheral edema  Musculoskeletal: No deformities, no cyanosis or clubbing  Neuro: alert, non focal  Skin: Warm, no lesions or rashes   12/02/14 --  COMPARISON: Chest CT 11/28/2012.  FINDINGS: Mediastinum/Lymph Nodes: Heart size  is normal. There is no significant pericardial fluid, thickening or pericardial calcification. There is atherosclerosis of the thoracic aorta, the great vessels of the mediastinum and the coronary arteries, including calcified atherosclerotic plaque in the left main, left anterior descending, left circumflex and right coronary arteries. No pathologically enlarged mediastinal or hilar lymph nodes. Numerous densely calcified mediastinal and right hilar lymph nodes are noted. Esophagus is unremarkable in appearance. No axillary lymphadenopathy.  Lungs/Pleura: Focal area of cystic bronchiectasis and architectural distortion in the medial segment of the right middle lobe. Some other scattered areas of mild cylindrical bronchiectasis are noted in the right lung. Chronic area of scarring in the posterior aspect of the right lower lobe is unchanged compared to the prior examination. High-resolution images demonstrate no additional areas of ground-glass attenuation, subpleural reticulation, parenchymal banding or honeycombing to suggest interstitial lung disease. No acute consolidative airspace disease. No pleural effusions. No suspicious appearing pulmonary nodules or masses.  Upper abdomen: Incompletely visualized low-attenuation lesion measuring 1.6 cm in diameter adjacent to the falciform ligament is incompletely characterized, but is similar to remote prior study 05/10/2006 and statistically likely a cyst.  Musculoskeletal: Multiple old healed posterior right-sided rib fractures are noted. Old compression fracture of T11 with approximately 20% loss of anterior vertebral body height. There are no aggressive appearing lytic or blastic lesions noted in the visualized portions of the skeleton.  IMPRESSION: 1. No findings to suggest interstitial lung disease. 2. The overall appearance the chest is very similar to the prior study 11/28/2012. Findings are nonspecific, but could be seen  in the setting of reported sarcoidosis, as detailed above. 3. Areas of bronchiectasis are noted in the right lung, most severe in the medial segment of the right middle lobe where there are areas of cystic bronchiectasis and architectural distortion. 4. Atherosclerosis, including left main and 3 vessel coronary artery disease. Assessment for potential risk factor modification, dietary therapy or pharmacologic therapy may be warranted, if clinically indicated. 5. Additional incidental findings, as above   12/10/13 --  Myocardial perfusion scan >> normal perfusion  Assessment & Plan:  SARCOIDOSIS, PULMONARY Appears to be clinically and radiographically stable. She is having cough that I suspect is related to rhinitis. If her cough doesn't get better then I would like to investigate her sarcoid further, consider restart BD's etc.   Cough Suspect related 2 rhinitis. Start nasal steroid and loratadine. She will call me in one month to let us know if her cough is improved. If not I'll pursue this further

## 2015-03-19 NOTE — Assessment & Plan Note (Signed)
Suspect related 2 rhinitis. Start nasal steroid and loratadine. She will call me in one month to let us know if her cough is improved. If not I'll pursue this further

## 2015-03-19 NOTE — Patient Instructions (Signed)
Your CT scan of your chest was stable in October 2016 Please keep your albuterol available to use as needed.  Please start fluticasone nasal spray, 2 sprays each nostril daily Please start loratadine 10mg  daily.  Call our office in 1 month to let us know if your coughing is better.  Follow with Dr [name] in 6 months or sooner if you have any problems

## 2015-03-19 NOTE — Assessment & Plan Note (Signed)
Appears to be clinically and radiographically stable. She is having cough that I suspect is related to rhinitis. If her cough doesn't get better then I would like to investigate her sarcoid further, consider restart BD's etc.

## 2015-04-10 ENCOUNTER — Encounter (HOSPITAL_COMMUNITY): Payer: Self-pay

## 2015-04-10 ENCOUNTER — Ambulatory Visit (HOSPITAL_COMMUNITY)
Admission: RE | Admit: 2015-04-10 | Discharge: 2015-04-10 | Disposition: A | Discharge status: Home / Self Care | Payer: Medicare Other | Source: Ambulatory Visit | Attending: Internal Medicine | Admitting: Internal Medicine

## 2015-04-10 DIAGNOSIS — M810 Age-related osteoporosis without current pathological fracture: Secondary | ICD-10-CM | POA: Diagnosis not present

## 2015-04-10 MED ORDER — DENOSUMAB 60 MG/ML ~~LOC~~ SOLN
60.0000 mg | Freq: Once | SUBCUTANEOUS | Status: AC
  Administered 2015-04-10: 60 mg via SUBCUTANEOUS
  Filled 2015-04-10: qty 1

## 2015-04-10 NOTE — Discharge Instructions (Signed)
Denosumab injection  What is this medicine?  DENOSUMAB (den oh sue mab) slows bone breakdown. Prolia is used to treat osteoporosis in women after menopause and in men. Xgeva is used to prevent bone fractures and other bone problems caused by cancer bone metastases. Xgeva is also used to treat giant cell tumor of the bone.  This medicine may be used for other purposes; ask your health care provider or pharmacist if you have questions.  What should I tell my health care provider before I take this medicine?  They need to know if you have any of these conditions:  -dental disease  -eczema  -infection or history of infections  -kidney disease or on dialysis  -low blood calcium or vitamin D  -malabsorption syndrome  -scheduled to have surgery or tooth extraction  -taking medicine that contains denosumab  -thyroid or parathyroid disease  -an unusual reaction to denosumab, other medicines, foods, dyes, or preservatives  -pregnant or trying to get pregnant  -breast-feeding  How should I use this medicine?  This medicine is for injection under the skin. It is given by a health care professional in a hospital or clinic setting.  If you are getting Prolia, a special MedGuide will be given to you by the pharmacist with each prescription and refill. Be sure to read this information carefully each time.  For Prolia, talk to your pediatrician regarding the use of this medicine in children. Special care may be needed. For Xgeva, talk to your pediatrician regarding the use of this medicine in children. While this drug may be prescribed for children as young as 13 years for selected conditions, precautions do apply.  Overdosage: If you think you have taken too much of this medicine contact a poison control center or emergency room at once.  NOTE: This medicine is only for you. Do not share this medicine with others.  What if I miss a dose?  It is important not to miss your dose. Call your doctor or health care professional if you are  unable to keep an appointment.  What may interact with this medicine?  Do not take this medicine with any of the following medications:  -other medicines containing denosumab  This medicine may also interact with the following medications:  -medicines that suppress the immune system  -medicines that treat cancer  -steroid medicines like prednisone or cortisone  This list may not describe all possible interactions. Give your health care provider a list of all the medicines, herbs, non-prescription drugs, or dietary supplements you use. Also tell them if you smoke, drink alcohol, or use illegal drugs. Some items may interact with your medicine.  What should I watch for while using this medicine?  Visit your doctor or health care professional for regular checks on your progress. Your doctor or health care professional may order blood tests and other tests to see how you are doing.  Call your doctor or health care professional if you get a cold or other infection while receiving this medicine. Do not treat yourself. This medicine may decrease your body's ability to fight infection.  You should make sure you get enough calcium and vitamin D while you are taking this medicine, unless your doctor tells you not to. Discuss the foods you eat and the vitamins you take with your health care professional.  See your dentist regularly. Brush and floss your teeth as directed. Before you have any dental work done, tell your dentist you are receiving this medicine.  Do   not become pregnant while taking this medicine or for 5 months after stopping it. Women should inform their doctor if they wish to become pregnant or think they might be pregnant. There is a potential for serious side effects to an unborn child. Talk to your health care professional or pharmacist for more information.  What side effects may I notice from receiving this medicine?  Side effects that you should report to your doctor or health care professional as soon as  possible:  -allergic reactions like skin rash, itching or hives, swelling of the face, lips, or tongue  -breathing problems  -chest pain  -fast, irregular heartbeat  -feeling faint or lightheaded, falls  -fever, chills, or any other sign of infection  -muscle spasms, tightening, or twitches  -numbness or tingling  -skin blisters or bumps, or is dry, peels, or red  -slow healing or unexplained pain in the mouth or jaw  -unusual bleeding or bruising  Side effects that usually do not require medical attention (Report these to your doctor or health care professional if they continue or are bothersome.):  -muscle pain  -stomach upset, gas  This list may not describe all possible side effects. Call your doctor for medical advice about side effects. You may report side effects to FDA at 1-800-FDA-1088.  Where should I keep my medicine?  This medicine is only given in a clinic, doctor's office, or other health care setting and will not be stored at home.  NOTE: This sheet is a summary. It may not cover all possible information. If you have questions about this medicine, talk to your doctor, pharmacist, or health care provider.      2016, Elsevier/Gold Standard. (2011-08-08 12:37:47)

## 2015-04-17 ENCOUNTER — Encounter: Payer: Self-pay | Admitting: Podiatry

## 2015-04-17 ENCOUNTER — Ambulatory Visit (INDEPENDENT_AMBULATORY_CARE_PROVIDER_SITE_OTHER): Payer: Medicare Other | Admitting: Podiatry

## 2015-04-17 DIAGNOSIS — M2042 Other hammer toe(s) (acquired), left foot: Secondary | ICD-10-CM | POA: Diagnosis not present

## 2015-04-17 DIAGNOSIS — L84 Corns and callosities: Secondary | ICD-10-CM | POA: Diagnosis not present

## 2015-04-17 DIAGNOSIS — M216X9 Other acquired deformities of unspecified foot: Secondary | ICD-10-CM | POA: Diagnosis not present

## 2015-04-20 NOTE — Progress Notes (Signed)
Subjective:     Patient ID: [name], female   DOB: 12-17-1934, 81 y.o.   MRN: [id number]  HPI patient presents with significant structural deformity of the left over right foot with plantar keratotic lesions that are painful and make walking difficult   Review of Systems     Objective:   Physical Exam Neurovascular status intact muscle strength was adequate with plantar keratotic lesions left that are painful when pressed and makes shoe gear and walking difficult    Assessment:     Lesion secondary to structural malalignment of the digits with plantar keratotic tissue formation    Plan:     Educated her on hammertoes and considerations for digital stabilization and metatarsal osteotomy. She is can hold off on this and today I debrided nailbeds and lesions

## 2015-05-18 ENCOUNTER — Ambulatory Visit: Payer: Medicare Other | Admitting: Physician Assistant

## 2015-05-18 ENCOUNTER — Encounter: Payer: Self-pay | Admitting: Physician Assistant

## 2015-05-18 ENCOUNTER — Ambulatory Visit (INDEPENDENT_AMBULATORY_CARE_PROVIDER_SITE_OTHER): Payer: Medicare Other | Admitting: Physician Assistant

## 2015-05-18 ENCOUNTER — Other Ambulatory Visit: Payer: Self-pay | Admitting: Physician Assistant

## 2015-05-18 ENCOUNTER — Encounter: Payer: Self-pay | Admitting: *Deleted

## 2015-05-18 VITALS — BP 140/50 | HR 62 | Ht 63.0 in | Wt 130.0 lb

## 2015-05-18 DIAGNOSIS — E785 Hyperlipidemia, unspecified: Secondary | ICD-10-CM

## 2015-05-18 DIAGNOSIS — M79601 Pain in right arm: Secondary | ICD-10-CM

## 2015-05-18 DIAGNOSIS — I739 Peripheral vascular disease, unspecified: Secondary | ICD-10-CM

## 2015-05-18 DIAGNOSIS — I10 Essential (primary) hypertension: Secondary | ICD-10-CM | POA: Insufficient documentation

## 2015-05-18 DIAGNOSIS — I2510 Atherosclerotic heart disease of native coronary artery without angina pectoris: Secondary | ICD-10-CM | POA: Insufficient documentation

## 2015-05-18 DIAGNOSIS — I6523 Occlusion and stenosis of bilateral carotid arteries: Secondary | ICD-10-CM

## 2015-05-18 DIAGNOSIS — I25119 Atherosclerotic heart disease of native coronary artery with unspecified angina pectoris: Secondary | ICD-10-CM

## 2015-05-18 DIAGNOSIS — D869 Sarcoidosis, unspecified: Secondary | ICD-10-CM

## 2015-05-18 DIAGNOSIS — I471 Supraventricular tachycardia: Secondary | ICD-10-CM

## 2015-05-18 DIAGNOSIS — M79602 Pain in left arm: Secondary | ICD-10-CM

## 2015-05-18 DIAGNOSIS — I779 Disorder of arteries and arterioles, unspecified: Secondary | ICD-10-CM | POA: Insufficient documentation

## 2015-05-18 LAB — CBC WITH DIFFERENTIAL/PLATELET
BASOS PCT: 1 % (ref 0–1)
Basophils Absolute: 0.1 10*3/uL (ref 0.0–0.1)
Eosinophils Absolute: 0.1 10*3/uL (ref 0.0–0.7)
Eosinophils Relative: 2 % (ref 0–5)
HCT: 39.4 % (ref 36.0–46.0)
HEMOGLOBIN: 13.2 g/dL (ref 12.0–15.0)
Lymphocytes Relative: 26 % (ref 12–46)
Lymphs Abs: 1.6 10*3/uL (ref 0.7–4.0)
MCH: 31.4 pg (ref 26.0–34.0)
MCHC: 33.5 g/dL (ref 30.0–36.0)
MCV: 93.6 fL (ref 78.0–100.0)
MONO ABS: 0.7 10*3/uL (ref 0.1–1.0)
MONOS PCT: 11 % (ref 3–12)
MPV: 10.5 fL (ref 8.6–12.4)
Neutro Abs: 3.6 10*3/uL (ref 1.7–7.7)
Neutrophils Relative %: 60 % (ref 43–77)
Platelets: 234 10*3/uL (ref 150–400)
RBC: 4.21 MIL/uL (ref 3.87–5.11)
RDW: 14.2 % (ref 11.5–15.5)
WBC: 6 10*3/uL (ref 4.0–10.5)

## 2015-05-18 LAB — BASIC METABOLIC PANEL
BUN: 23 mg/dL (ref 7–25)
CALCIUM: 9.5 mg/dL (ref 8.6–10.4)
CO2: 28 mmol/L (ref 20–31)
Chloride: 106 mmol/L (ref 98–110)
Creat: 0.85 mg/dL (ref 0.60–0.93)
GLUCOSE: 72 mg/dL (ref 65–99)
Potassium: 4.4 mmol/L (ref 3.5–5.3)
Sodium: 143 mmol/L (ref 135–146)

## 2015-05-18 LAB — PROTIME-INR
INR: 0.99 (ref <1.50)
Prothrombin Time: 13.2 seconds (ref 11.6–15.2)

## 2015-05-18 MED ORDER — NITROGLYCERIN 0.4 MG SL SUBL: 0.4000 mg | SUBLINGUAL_TABLET | SUBLINGUAL | Status: DC | PRN

## 2015-05-18 NOTE — Progress Notes (Signed)
Cardiology Office Note:    Date:  05/18/2015   ID:  [name], DOB 1934-12-12, MRN [id number]  PCP:  [name], MD  Cardiologist:  Dr. Loralie Champagne   Electrophysiologist:  n/a Pulmonology:  Dr. [name]  Chief Complaint  Patient presents with  . Arm Pain    History of Present Illness:     [name] is a 81 y.o. female with a hx of pulmonary sarcoidosis, HTN, HL, SVT. Prior patient of Dr. Bing Quarry. Established with Dr. Loralie Champagne in 2014. In 10/15 she c/o worsening DOE. Echo demonstrated normal LVF and RVF and a nuclear stress test was low risk and neg for ischemia.   I saw her in 11/16. She had undergone a FU high resolution CT which demonstrated changes likely c/w sarcoidosis, bronchiectasis as well as 3 v calcification including the LM. She was asked to FU with cardiology. Follow-up nuclear stress test was arranged. This was low risk and negative for ischemia.   She presents today with c/o bilateral arm pain over the past 2 weeks. She denies positional changes.  She denies injury.  She denies neck symptoms.  She denies numbness, tingling, weakness.  She denies assoc nausea, diaphoresis, dyspnea.  She denies exertional symptoms.  She has chronic DOE.  This is stable.  She notes significant fatigue.  She denies orthopnea, PND, edema, syncope.     Past Medical History  Diagnosis Date  . Paroxysmal supraventricular tachycardia ([place])   . Other chronic pulmonary heart diseases   . Other and unspecified hyperlipidemia   . Other symptoms involving cardiovascular system   . Personal history of other diseases of digestive system   . Sarcoidosis ([place])   . Esophageal reflux   . Allergy     SEASONAL  . Arthritis   . Osteoporosis   . Carotid bruit   . Hematochezia   . Hx of colonoscopy   . Coronary artery calcification seen on CAT scan     a. Myoview 11/16: EF 75%, normal perfusion, (there was no blood pressure drop at low level exercise with  Lexiscan infusion), low risk study    Past Surgical History  Procedure Laterality Date  . Trapezium resection Right   . Carpal tunnel release Left   . Shoulder arthroscopy w/ rotator cuff repair Right   . Foot surgery Right   . Bladder surgery    . Arm surgery Right   . Colonoscopy    . Polypectomy    . Hip surgery Right 2011    full replacement  . Leg surgery Left May 2014    femur fracture s/p open and closed reduction in [place], Dr. Jimmye Norman  1. Hyperlipidemia 2. Sarcoidosis 3. H/o SVT 4. Carotid bruits: carotid dopplers (9/13) with minimal carotid stenosis.  5. Echo (10/10) with EF 60-65%, mild MR.  6. Femur fracture in 6/14 s/p repair.    Current Medications: Outpatient Prescriptions Prior to Visit  Medication Sig Dispense Refill  . albuterol (PROVENTIL HFA;VENTOLIN HFA) 108 (90 BASE) MCG/ACT inhaler Inhale 2 puffs into the lungs every 4 (four) hours as needed for wheezing or shortness of breath. 1 Inhaler 6  . aspirin EC 81 MG tablet Take 1 tablet (81 mg total) by mouth daily.    Marland Kitchen atorvastatin (LIPITOR) 40 MG tablet Take 1 tablet (40 mg total) by mouth daily. 90 tablet 3  . diltiazem (CARDIZEM CD) 180 MG 24 hr capsule Take 180 mg by mouth daily.      . fluticasone (  FLONASE) 50 MCG/ACT nasal spray Place 2 sprays into both nostrils daily. 16 g 5  . meloxicam (MOBIC) 15 MG tablet Take 15 mg by mouth as needed for pain.     Marland Kitchen MYRBETRIQ 50 MG TB24 tablet Take 50 mg by mouth daily.     Marland Kitchen omeprazole (PRILOSEC) 40 MG capsule Take 40 mg by mouth daily.     . traMADol (ULTRAM) 50 MG tablet Take 50 mg by mouth every 6 (six) hours as needed for severe pain.     . Vitamin D, Ergocalciferol, (DRISDOL) 50000 UNITS CAPS Take 50,000 Units by mouth every 7 (seven) days.     . traZODone (DESYREL) 50 MG tablet Take 1 tablet (50 mg total) by mouth at bedtime. (Patient not taking: Reported on 05/18/2015) 90 tablet 0   No facility-administered medications prior to visit.     Allergies:    Morphine; Oxycodone-acetaminophen; Percocet; and Sulfonamide derivatives   Social History   Social History  . Marital Status: Widowed    Spouse Name: N/A  . Number of Children: 1  . Years of Education: N/A   Occupational History  . retired     still works as Oceanographer  .     Social History Main Topics  . Smoking status: Never Smoker   . Smokeless tobacco: Never Used  . Alcohol Use: No  . Drug Use: No  . Sexual Activity: No   Other Topics Concern  . None   Social History Narrative     Family History:  The patient's family history includes Arrhythmia in her father; Cancer in her father and mother; Colon cancer (age of onset: 70) in her mother; Heart disease in her father; Lung cancer in her father. There is no history of Esophageal cancer, Rectal cancer, Stomach cancer, Heart attack, or Stroke.   ROS:   Please see the history of present illness.    ROS All other systems reviewed and are negative.   Physical Exam:    VS:  BP 140/50 mmHg  Pulse 62  Ht 5\' 3"  (1.6 m)  Wt 130 lb (58.968 kg)  BMI 23.03 kg/m2   GEN: Well nourished, well developed, in no acute distress HEENT: normal Neck: no JVD, no masses Cardiac: Normal S1/S2,  RRR; no murmurs, rubs, or gallops, no edema   Respiratory:  clear to auscultation bilaterally; no wheezing, rhonchi or rales GI: soft, nontender, nondistended MS: no deformity or atrophy Skin: warm and dry Neuro: No focal deficits  Psych: Alert and oriented x 3, normal affect  Wt Readings from Last 3 Encounters:  05/18/15 130 lb (58.968 kg)  04/10/15 130 lb (58.968 kg)  03/19/15 130 lb (58.968 kg)      Studies/Labs Reviewed:     EKG:  EKG is  ordered today.  The ekg ordered today demonstrates NSR, HR 61, normal axis, septal Q waves, QTc 414 ms, no change from prior tracings.   Recent Labs: 02/10/2015: ALT 17   Recent Lipid Panel    Component Value Date/Time   CHOL 157 02/10/2015 0758   TRIG 66 02/10/2015 0758   HDL 67  02/10/2015 0758   CHOLHDL 2.3 02/10/2015 0758   VLDL 13 02/10/2015 0758   LDLCALC 77 02/10/2015 0758    Additional studies/ records that were reviewed today include:   Myoview 11/16 EF 75%, no ischemia, low risk  High Resolution CT 12/02/14 IMPRESSION: 1. No findings to suggest interstitial lung disease. 2. The overall appearance the chest is very similar  to the prior study 11/28/2012. Findings are nonspecific, but could be seen in the setting of reported sarcoidosis, as detailed above. 3. Areas of bronchiectasis are noted in the right lung, most severe in the medial segment of the right middle lobe where there are areas of cystic bronchiectasis and architectural distortion. 4. Atherosclerosis, including left main and 3 vessel coronary artery disease. Assessment for potential risk factor modification, dietary therapy or pharmacologic therapy may be warranted, if clinically indicated. 5. Additional incidental findings, as above.  Myoview 10/15 Normal stress nuclear study. LV Ejection Fraction: 70%.  Echo 10/15 EF 60-65%, no RWMA  Carotid US 9/15 Bilateral 1-39% ICA >> FU 2 years   ASSESSMENT:     1. Bilateral arm pain   2. Coronary artery disease involving native coronary artery of native heart with angina pectoris (HCC)   3. Paroxysmal supraventricular tachycardia ([place])   4. Essential hypertension   5. HLD (hyperlipidemia)   6. SARCOIDOSIS, PULMONARY   7. Carotid stenosis, bilateral     PLAN:     In order of problems listed above:  1. Arm pain - She has 3v CAD on high res CT done last year for surveillance of sarcoidosis.  She now presents with bilateral arm pain that is concerning for atypical angina.  Nuclear study in 11/16 was low risk.  Given her age, risk factors, prior CT results, I have recommended proceeding with cardiac cath.  It is possible she has LM involvement and false neg nuclear study.  I d/w Dr. Meda Coffee (DOD) who agrees.  The patient notes that she would  like to FU with a different cardiologist as she can never get in to see Dr. Aundra Dubin and has only seen him once (2.5 years ago).  I will arrange cardiac cath with Dr. Burt Knack.  I advised her that, if her cath shows significant CAD, I can follow her along with Dr. Burt Knack in the future.    2. CAD:  She has 3v coronary artery calcification on recent high resolution CT done for FU on sarcoidosis.  She has chronic DOE.  Nuclear stress test 11/16 was low risk and negative for ischemia.   as noted, she now presents with bilateral arm pain that is quite concerning for her along with fatigue.  There is concern for obstructive CAD.  cardiac cath has been recommended.  Risks and benefits of cardiac catheterization have been discussed with the patient.  These include bleeding, infection, kidney damage, stroke, heart attack, death.  The patient understands these risks and is willing to proceed. Continue Atorvastatin, ASA 81 mg QD.   I will give her NTG to use prn.  She knows to go to the ED if she has worse symptoms.    3. PSVT:  No apparent recurrence.  Continue calcium channel blocker.   4. HTN:  Controlled.    5. Hyperlipidemia:   Atorvastatin 40 mg QD.  LDL in 12/16 was 77.   6. Pulmonary Sarcoidosis:  FU with Pulmonology as planned.   7. Carotid Stenosis:  FU Carotid US due in 10/2015.    Medication Adjustments/Labs and Tests Ordered: Current medicines are reviewed at length with the patient today.  Concerns regarding medicines are outlined above.  Medication changes, Labs and Tests ordered today are outlined in the Patient Instructions noted below. Patient Instructions  Medication Instructions:  1. AN RX FOR NITROGLYCERIN HAS BEEN SENT IN AND YOU HAVE BEEN ADVISED AS TO HOW AND WHEN TO USE NTG  Labwork: 1. TODAY BMET,  CBC W/DIFF, PT/INR  Testing/Procedures: Your physician has requested that you have a cardiac catheterization. Cardiac catheterization is used to diagnose and/or treat various heart  conditions. Doctors may recommend this procedure for a number of different reasons. The most common reason is to evaluate chest pain. Chest pain can be a symptom of coronary artery disease (CAD), and cardiac catheterization can show whether plaque is narrowing or blocking your heart's arteries. This procedure is also used to evaluate the valves, as well as measure the blood flow and oxygen levels in different parts of your heart. For further information please visit HugeFiesta.[email or web address]. Please follow instruction sheet, as given.  Follow-Up: 06/04/15 @ 2:20 WITH Ali Mclaurin, PAC ; POST CATH FOLLOW UP   Any Other Special Instructions Will Be Listed Below (If Applicable).  If you need a refill on your cardiac medications before your next appointment, please call your pharmacy.   Signed, [name], PA-C  05/18/2015 10:26 AM    [facility] [address], [place], [place]  [address] Phone: [phone]; Fax: [phone]

## 2015-05-18 NOTE — Patient Instructions (Addendum)
Medication Instructions:  1. AN RX FOR NITROGLYCERIN HAS BEEN SENT IN AND YOU HAVE BEEN ADVISED AS TO HOW AND WHEN TO USE NTG  Labwork: 1. TODAY BMET, CBC W/DIFF, PT/INR  Testing/Procedures: Your physician has requested that you have a cardiac catheterization. Cardiac catheterization is used to diagnose and/or treat various heart conditions. Doctors may recommend this procedure for a number of different reasons. The most common reason is to evaluate chest pain. Chest pain can be a symptom of coronary artery disease (CAD), and cardiac catheterization can show whether plaque is narrowing or blocking your heart's arteries. This procedure is also used to evaluate the valves, as well as measure the blood flow and oxygen levels in different parts of your heart. For further information please visit HugeFiesta.[email or web address]. Please follow instruction sheet, as given.  Follow-Up: 06/04/15 @ 2:20 WITH [name] WEAVER, PAC ; POST CATH FOLLOW UP   Any Other Special Instructions Will Be Listed Below (If Applicable).  If you need a refill on your cardiac medications before your next appointment, please call your pharmacy.

## 2015-05-19 ENCOUNTER — Telehealth: Payer: Self-pay | Admitting: *Deleted

## 2015-05-19 NOTE — Telephone Encounter (Signed)
Ptcb and has been notified of lab results and ok to proceed with cath.

## 2015-05-19 NOTE — Telephone Encounter (Signed)
No answer, will try again later.

## 2015-05-20 ENCOUNTER — Ambulatory Visit (HOSPITAL_COMMUNITY)
Admission: RE | Admit: 2015-05-20 | Discharge: 2015-05-20 | Disposition: A | Discharge status: Home / Self Care | Payer: Medicare Other | Source: Ambulatory Visit | Attending: Cardiovascular Disease | Admitting: Cardiovascular Disease

## 2015-05-20 ENCOUNTER — Encounter (HOSPITAL_COMMUNITY)
Admission: RE | Disposition: A | Discharge status: Home / Self Care | Payer: Self-pay | Source: Ambulatory Visit | Attending: Cardiovascular Disease

## 2015-05-20 DIAGNOSIS — Z8249 Family history of ischemic heart disease and other diseases of the circulatory system: Secondary | ICD-10-CM | POA: Insufficient documentation

## 2015-05-20 DIAGNOSIS — E785 Hyperlipidemia, unspecified: Secondary | ICD-10-CM | POA: Diagnosis not present

## 2015-05-20 DIAGNOSIS — I6523 Occlusion and stenosis of bilateral carotid arteries: Secondary | ICD-10-CM | POA: Insufficient documentation

## 2015-05-20 DIAGNOSIS — M1990 Unspecified osteoarthritis, unspecified site: Secondary | ICD-10-CM | POA: Insufficient documentation

## 2015-05-20 DIAGNOSIS — D860 Sarcoidosis of lung: Secondary | ICD-10-CM | POA: Insufficient documentation

## 2015-05-20 DIAGNOSIS — M79602 Pain in left arm: Secondary | ICD-10-CM | POA: Diagnosis not present

## 2015-05-20 DIAGNOSIS — M79601 Pain in right arm: Secondary | ICD-10-CM | POA: Diagnosis not present

## 2015-05-20 DIAGNOSIS — I25119 Atherosclerotic heart disease of native coronary artery with unspecified angina pectoris: Secondary | ICD-10-CM

## 2015-05-20 DIAGNOSIS — Z7982 Long term (current) use of aspirin: Secondary | ICD-10-CM | POA: Diagnosis not present

## 2015-05-20 DIAGNOSIS — I2510 Atherosclerotic heart disease of native coronary artery without angina pectoris: Secondary | ICD-10-CM | POA: Diagnosis present

## 2015-05-20 DIAGNOSIS — I25118 Atherosclerotic heart disease of native coronary artery with other forms of angina pectoris: Secondary | ICD-10-CM | POA: Diagnosis not present

## 2015-05-20 DIAGNOSIS — I10 Essential (primary) hypertension: Secondary | ICD-10-CM | POA: Diagnosis not present

## 2015-05-20 DIAGNOSIS — I471 Supraventricular tachycardia: Secondary | ICD-10-CM | POA: Insufficient documentation

## 2015-05-20 DIAGNOSIS — M810 Age-related osteoporosis without current pathological fracture: Secondary | ICD-10-CM | POA: Insufficient documentation

## 2015-05-20 DIAGNOSIS — K219 Gastro-esophageal reflux disease without esophagitis: Secondary | ICD-10-CM | POA: Diagnosis not present

## 2015-05-20 HISTORY — PX: CARDIAC CATHETERIZATION: SHX172

## 2015-05-20 LAB — POCT ACTIVATED CLOTTING TIME
Activated Clotting Time: 245 seconds
Activated Clotting Time: 265 seconds

## 2015-05-20 SURGERY — LEFT HEART CATH AND CORONARY ANGIOGRAPHY

## 2015-05-20 MED ORDER — SODIUM CHLORIDE 0.9 % IV SOLN
INTRAVENOUS | Status: DC
  Administered 2015-05-20: 12:00:00 via INTRAVENOUS

## 2015-05-20 MED ORDER — HEPARIN (PORCINE) IN NACL 2-0.9 UNIT/ML-% IJ SOLN
INTRAMUSCULAR | Status: DC | PRN
  Administered 2015-05-20: 1500 mL

## 2015-05-20 MED ORDER — ADENOSINE (DIAGNOSTIC) 140MCG/KG/MIN
INTRAVENOUS | Status: DC | PRN
  Administered 2015-05-20: 140 ug/kg/min via INTRAVENOUS

## 2015-05-20 MED ORDER — NITROGLYCERIN 1 MG/10 ML FOR IR/CATH LAB
INTRA_ARTERIAL | Status: DC | PRN
  Administered 2015-05-20: 150 ug via INTRACORONARY

## 2015-05-20 MED ORDER — MIDAZOLAM HCL 2 MG/2ML IJ SOLN
INTRAMUSCULAR | Status: AC
  Filled 2015-05-20: qty 2

## 2015-05-20 MED ORDER — SODIUM CHLORIDE 0.9% FLUSH: 3.0000 mL | Freq: Two times a day (BID) | INTRAVENOUS | Status: DC

## 2015-05-20 MED ORDER — SODIUM CHLORIDE 0.9% FLUSH: 3.0000 mL | INTRAVENOUS | Status: DC | PRN

## 2015-05-20 MED ORDER — IOPAMIDOL (ISOVUE-370) INJECTION 76%
INTRAVENOUS | Status: AC
  Filled 2015-05-20: qty 200

## 2015-05-20 MED ORDER — ADENOSINE 12 MG/4ML IV SOLN
12.0000 mL | Freq: Once | INTRAVENOUS | Status: DC
  Filled 2015-05-20: qty 12

## 2015-05-20 MED ORDER — MIDAZOLAM HCL 2 MG/2ML IJ SOLN
INTRAMUSCULAR | Status: DC | PRN
  Administered 2015-05-20 (×4): 1 mg via INTRAVENOUS

## 2015-05-20 MED ORDER — FENTANYL CITRATE (PF) 100 MCG/2ML IJ SOLN
INTRAMUSCULAR | Status: AC
  Filled 2015-05-20: qty 2

## 2015-05-20 MED ORDER — VERAPAMIL HCL 2.5 MG/ML IV SOLN
INTRAVENOUS | Status: DC | PRN
  Administered 2015-05-20: 10 mL via INTRA_ARTERIAL

## 2015-05-20 MED ORDER — SODIUM CHLORIDE 0.9 % IV SOLN: INTRAVENOUS | Status: AC

## 2015-05-20 MED ORDER — HEPARIN (PORCINE) IN NACL 2-0.9 UNIT/ML-% IJ SOLN
INTRAMUSCULAR | Status: AC
  Filled 2015-05-20: qty 1500

## 2015-05-20 MED ORDER — HEPARIN SODIUM (PORCINE) 1000 UNIT/ML IJ SOLN
INTRAMUSCULAR | Status: AC
  Filled 2015-05-20: qty 1

## 2015-05-20 MED ORDER — VERAPAMIL HCL 2.5 MG/ML IV SOLN
INTRAVENOUS | Status: AC
  Filled 2015-05-20: qty 2

## 2015-05-20 MED ORDER — SODIUM CHLORIDE 0.9 % IV SOLN: 250.0000 mL | INTRAVENOUS | Status: DC | PRN

## 2015-05-20 MED ORDER — LIDOCAINE HCL (PF) 1 % IJ SOLN
INTRAMUSCULAR | Status: AC
  Filled 2015-05-20: qty 30

## 2015-05-20 MED ORDER — NITROGLYCERIN 1 MG/10 ML FOR IR/CATH LAB
INTRA_ARTERIAL | Status: AC
  Filled 2015-05-20: qty 10

## 2015-05-20 MED ORDER — LIDOCAINE HCL (PF) 1 % IJ SOLN
INTRAMUSCULAR | Status: DC | PRN
  Administered 2015-05-20: 1 mL via INTRADERMAL

## 2015-05-20 MED ORDER — FENTANYL CITRATE (PF) 100 MCG/2ML IJ SOLN
INTRAMUSCULAR | Status: DC | PRN
  Administered 2015-05-20 (×4): 25 ug via INTRAVENOUS

## 2015-05-20 MED ORDER — ASPIRIN 81 MG PO CHEW: 81.0000 mg | CHEWABLE_TABLET | ORAL | Status: DC

## 2015-05-20 MED ORDER — HEPARIN SODIUM (PORCINE) 1000 UNIT/ML IJ SOLN
INTRAMUSCULAR | Status: DC | PRN
  Administered 2015-05-20 (×2): 3000 [IU] via INTRAVENOUS

## 2015-05-20 MED ORDER — ONDANSETRON HCL 4 MG/2ML IJ SOLN: 4.0000 mg | Freq: Four times a day (QID) | INTRAMUSCULAR | Status: DC | PRN

## 2015-05-20 SURGICAL SUPPLY — 18 items
CATH INFINITI 5 FR JL3.5 (CATHETERS) ×1 IMPLANT
CATH INFINITI 5 FR MPA2 (CATHETERS) ×1 IMPLANT
CATH INFINITI 5FR ANG PIGTAIL (CATHETERS) ×1 IMPLANT
CATH INFINITI JR4 5F (CATHETERS) ×1 IMPLANT
CATH LAUNCHER 5F EBU3.0 (CATHETERS) IMPLANT
CATHETER LAUNCHER 5F EBU3.0 (CATHETERS) ×2
DEVICE RAD COMP TR BAND LRG (VASCULAR PRODUCTS) ×1 IMPLANT
GLIDESHEATH SLEND SS 6F .021 (SHEATH) ×1 IMPLANT
GUIDEWIRE PRESSURE COMET II (WIRE) ×1 IMPLANT
KIT ESSENTIALS PG (KITS) ×1 IMPLANT
KIT HEART LEFT (KITS) ×2 IMPLANT
PACK CARDIAC CATHETERIZATION (CUSTOM PROCEDURE TRAY) ×2 IMPLANT
STOPCOCK MORSE 400PSI 3WAY (MISCELLANEOUS) ×1 IMPLANT
SYR MEDRAD MARK V 150ML (SYRINGE) ×2 IMPLANT
TRANSDUCER W/STOPCOCK (MISCELLANEOUS) ×2 IMPLANT
TUBING CIL FLEX 10 FLL-RA (TUBING) ×2 IMPLANT
WIRE HI TORQ VERSACORE-J 145CM (WIRE) ×1 IMPLANT
WIRE SAFE-T 1.5MM-J .035X260CM (WIRE) ×1 IMPLANT

## 2015-05-20 NOTE — Discharge Instructions (Signed)
Radial Site Care °Refer to this sheet in the next few weeks. These instructions provide you with information about caring for yourself after your procedure. Your health care provider may also give you more specific instructions. Your treatment has been planned according to current medical practices, but problems sometimes occur. Call your health care provider if you have any problems or questions after your procedure. °WHAT TO EXPECT AFTER THE PROCEDURE °After your procedure, it is typical to have the following: °· Bruising at the radial site that usually fades within 1-2 weeks. °· Blood collecting in the tissue (hematoma) that may be painful to the touch. It should usually decrease in size and tenderness within 1-2 weeks. °HOME CARE INSTRUCTIONS °· Take medicines only as directed by your health care provider. °· You may shower 24-48 hours after the procedure or as directed by your health care provider. Remove the bandage (dressing) and gently wash the site with plain soap and water. Pat the area dry with a clean towel. Do not rub the site, because this may cause bleeding. °· Do not take baths, swim, or use a hot tub until your health care provider approves. °· Check your insertion site every day for redness, swelling, or drainage. °· Do not apply powder or lotion to the site. °· Do not flex or bend the affected arm for 24 hours or as directed by your health care provider. °· Do not push or pull heavy objects with the affected arm for 24 hours or as directed by your health care provider. °· Do not lift over 10 lb (4.5 kg) for 5 days after your procedure or as directed by your health care provider. °· Ask your health care provider when it is okay to: °¨ Return to work or school. °¨ Resume usual physical activities or sports. °¨ Resume sexual activity. °· Do not drive home if you are discharged the same day as the procedure. Have someone else drive you. °· You may drive 24 hours after the procedure unless otherwise  instructed by your health care provider. °· Do not operate machinery or power tools for 24 hours after the procedure. °· If your procedure was done as an outpatient procedure, which means that you went home the same day as your procedure, a responsible adult should be with you for the first 24 hours after you arrive home. °· Keep all follow-up visits as directed by your health care provider. This is important. °SEEK MEDICAL CARE IF: °· You have a fever. °· You have chills. °· You have increased bleeding from the radial site. Hold pressure on the site. °SEEK IMMEDIATE MEDICAL CARE IF: °· You have unusual pain at the radial site. °· You have redness, warmth, or swelling at the radial site. °· You have drainage (other than a small amount of blood on the dressing) from the radial site. °· The radial site is bleeding, hold steady pressure on the site and call 911. °· Your arm or hand becomes pale, cool, tingly, or numb. °  °This information is not intended to replace advice given to you by your health care provider. Make sure you discuss any questions you have with your health care provider. °  °Document Released: 03/12/2010 Document Revised: 02/28/2014 Document Reviewed: 08/26/2013 °Elsevier Interactive Patient Education ©2016 Elsevier Inc. ° °

## 2015-05-20 NOTE — Interval H&P Note (Signed)
History and Physical Interval Note:  05/20/2015 6:06 PM  [name]  has presented today for surgery, with the diagnosis of cad/arm pain  The various methods of treatment have been discussed with the patient and family. After consideration of risks, benefits and other options for treatment, the patient has consented to  Procedure(s): Left Heart Cath and Coronary Angiography (N/A) as a surgical intervention .  The patient's history has been reviewed, patient examined, no change in status, stable for surgery.  I have reviewed the patient's chart and labs.  Questions were answered to the patient's satisfaction.     Sherren Mocha

## 2015-05-20 NOTE — H&P (View-Only) (Signed)
Cardiology Office Note:    Date:  05/18/2015   ID:  MADELINN Mcgrath, DOB 04/25/1934, MRN [id number]  PCP:  Sherri Ringer, MD  Cardiologist:  Dr. Loralie Mcgrath   Electrophysiologist:  n/a Pulmonology:  Dr. Lamonte Mcgrath  Chief Complaint  Patient presents with  . Arm Pain    History of Present Illness:     [name] is a 81 y.o. female with a hx of pulmonary sarcoidosis, HTN, HL, SVT. Prior patient of Dr. Bing Mcgrath. Established with Dr. Loralie Mcgrath in 2014. In 10/15 she c/o worsening DOE. Echo demonstrated normal LVF and RVF and a nuclear stress test was low risk and neg for ischemia.   I saw her in 11/16. She had undergone a FU high resolution CT which demonstrated changes likely c/w sarcoidosis, bronchiectasis as well as 3 v calcification including the LM. She was asked to FU with cardiology. Follow-up nuclear stress test was arranged. This was low risk and negative for ischemia.   She presents today with c/o bilateral arm pain over the past 2 weeks. She denies positional changes.  She denies injury.  She denies neck symptoms.  She denies numbness, tingling, weakness.  She denies assoc nausea, diaphoresis, dyspnea.  She denies exertional symptoms.  She has chronic DOE.  This is stable.  She notes significant fatigue.  She denies orthopnea, PND, edema, syncope.     Past Medical History  Diagnosis Date  . Paroxysmal supraventricular tachycardia ([place])   . Other chronic pulmonary heart diseases   . Other and unspecified hyperlipidemia   . Other symptoms involving cardiovascular system   . Personal history of other diseases of digestive system   . Sarcoidosis ([place])   . Esophageal reflux   . Allergy     SEASONAL  . Arthritis   . Osteoporosis   . Carotid bruit   . Hematochezia   . Hx of colonoscopy   . Coronary artery calcification seen on CAT scan     a. Myoview 11/16: EF 75%, normal perfusion, (there was no blood pressure drop at low level exercise with  Lexiscan infusion), low risk study    Past Surgical History  Procedure Laterality Date  . Trapezium resection Right   . Carpal tunnel release Left   . Shoulder arthroscopy w/ rotator cuff repair Right   . Foot surgery Right   . Bladder surgery    . Arm surgery Right   . Colonoscopy    . Polypectomy    . Hip surgery Right 2011    full replacement  . Leg surgery Left May 2014    femur fracture s/p open and closed reduction in [place], Dr. Jimmye Mcgrath  1. Hyperlipidemia 2. Sarcoidosis 3. H/o SVT 4. Carotid bruits: carotid dopplers (9/13) with minimal carotid stenosis.  5. Echo (10/10) with EF 60-65%, mild MR.  6. Femur fracture in 6/14 s/p repair.    Current Medications: Outpatient Prescriptions Prior to Visit  Medication Sig Dispense Refill  . albuterol (PROVENTIL HFA;VENTOLIN HFA) 108 (90 BASE) MCG/ACT inhaler Inhale 2 puffs into the lungs every 4 (four) hours as needed for wheezing or shortness of breath. 1 Inhaler 6  . aspirin EC 81 MG tablet Take 1 tablet (81 mg total) by mouth daily.    Marland Kitchen atorvastatin (LIPITOR) 40 MG tablet Take 1 tablet (40 mg total) by mouth daily. 90 tablet 3  . diltiazem (CARDIZEM CD) 180 MG 24 hr capsule Take 180 mg by mouth daily.      . fluticasone (  FLONASE) 50 MCG/ACT nasal spray Place 2 sprays into both nostrils daily. 16 g 5  . meloxicam (MOBIC) 15 MG tablet Take 15 mg by mouth as needed for pain.     Marland Kitchen MYRBETRIQ 50 MG TB24 tablet Take 50 mg by mouth daily.     Marland Kitchen omeprazole (PRILOSEC) 40 MG capsule Take 40 mg by mouth daily.     . traMADol (ULTRAM) 50 MG tablet Take 50 mg by mouth every 6 (six) hours as needed for severe pain.     . Vitamin D, Ergocalciferol, (DRISDOL) 50000 UNITS CAPS Take 50,000 Units by mouth every 7 (seven) days.     . traZODone (DESYREL) 50 MG tablet Take 1 tablet (50 mg total) by mouth at bedtime. (Patient not taking: Reported on 05/18/2015) 90 tablet 0   No facility-administered medications prior to visit.     Allergies:    Morphine; Oxycodone-acetaminophen; Percocet; and Sulfonamide derivatives   Social History   Social History  . Marital Status: Widowed    Spouse Name: N/A  . Number of Children: 1  . Years of Education: N/A   Occupational History  . retired     still works as Oceanographer  .     Social History Main Topics  . Smoking status: Never Smoker   . Smokeless tobacco: Never Used  . Alcohol Use: No  . Drug Use: No  . Sexual Activity: No   Other Topics Concern  . None   Social History Narrative     Family History:  The patient's family history includes Arrhythmia in her father; Cancer in her father and mother; Colon cancer (age of onset: 41) in her mother; Heart disease in her father; Lung cancer in her father. There is no history of Esophageal cancer, Rectal cancer, Stomach cancer, Heart attack, or Stroke.   ROS:   Please see the history of present illness.    ROS All other systems reviewed and are negative.   Physical Exam:    VS:  BP 140/50 mmHg  Pulse 62  Ht 5\' 3"  (1.6 m)  Wt 130 lb (58.968 kg)  BMI 23.03 kg/m2   GEN: Well nourished, well developed, in no acute distress HEENT: normal Neck: no JVD, no masses Cardiac: Normal S1/S2,  RRR; no murmurs, rubs, or gallops, no edema   Respiratory:  clear to auscultation bilaterally; no wheezing, rhonchi or rales GI: soft, nontender, nondistended MS: no deformity or atrophy Skin: warm and dry Neuro: No focal deficits  Psych: Alert and oriented x 3, normal affect  Wt Readings from Last 3 Encounters:  05/18/15 130 lb (58.968 kg)  04/10/15 130 lb (58.968 kg)  03/19/15 130 lb (58.968 kg)      Studies/Labs Reviewed:     EKG:  EKG is  ordered today.  The ekg ordered today demonstrates NSR, HR 61, normal axis, septal Q waves, QTc 414 ms, no change from prior tracings.   Recent Labs: 02/10/2015: ALT 17   Recent Lipid Panel    Component Value Date/Time   CHOL 157 02/10/2015 0758   TRIG 66 02/10/2015 0758   HDL 67  02/10/2015 0758   CHOLHDL 2.3 02/10/2015 0758   VLDL 13 02/10/2015 0758   LDLCALC 77 02/10/2015 0758    Additional studies/ records that were reviewed today include:   Myoview 11/16 EF 75%, no ischemia, low risk  High Resolution CT 12/02/14 IMPRESSION: 1. No findings to suggest interstitial lung disease. 2. The overall appearance the chest is very similar  to the prior study 11/28/2012. Findings are nonspecific, but could be seen in the setting of reported sarcoidosis, as detailed above. 3. Areas of bronchiectasis are noted in the right lung, most severe in the medial segment of the right middle lobe where there are areas of cystic bronchiectasis and architectural distortion. 4. Atherosclerosis, including left main and 3 vessel coronary artery disease. Assessment for potential risk factor modification, dietary therapy or pharmacologic therapy may be warranted, if clinically indicated. 5. Additional incidental findings, as above.  Myoview 10/15 Normal stress nuclear study. LV Ejection Fraction: 70%.  Echo 10/15 EF 60-65%, no RWMA  Carotid US 9/15 Bilateral 1-39% ICA >> FU 2 years   ASSESSMENT:     1. Bilateral arm pain   2. Coronary artery disease involving native coronary artery of native heart with angina pectoris (HCC)   3. Paroxysmal supraventricular tachycardia ([place])   4. Essential hypertension   5. HLD (hyperlipidemia)   6. SARCOIDOSIS, PULMONARY   7. Carotid stenosis, bilateral     PLAN:     In order of problems listed above:  1. Arm pain - She has 3v CAD on high res CT done last year for surveillance of sarcoidosis.  She now presents with bilateral arm pain that is concerning for atypical angina.  Nuclear study in 11/16 was low risk.  Given her age, risk factors, prior CT results, I have recommended proceeding with cardiac cath.  It is possible she has LM involvement and false neg nuclear study.  I d/w Dr. Meda Coffee (DOD) who agrees.  The patient notes that she would  like to FU with a different cardiologist as she can never get in to see Dr. Aundra Dubin and has only seen him once (2.5 years ago).  I will arrange cardiac cath with Dr. Burt Knack.  I advised her that, if her cath shows significant CAD, I can follow her along with Dr. Burt Knack in the future.    2. CAD:  She has 3v coronary artery calcification on recent high resolution CT done for FU on sarcoidosis.  She has chronic DOE.  Nuclear stress test 11/16 was low risk and negative for ischemia.   as noted, she now presents with bilateral arm pain that is quite concerning for her along with fatigue.  There is concern for obstructive CAD.  cardiac cath has been recommended.  Risks and benefits of cardiac catheterization have been discussed with the patient.  These include bleeding, infection, kidney damage, stroke, heart attack, death.  The patient understands these risks and is willing to proceed. Continue Atorvastatin, ASA 81 mg QD.   I will give her NTG to use prn.  She knows to go to the ED if she has worse symptoms.    3. PSVT:  No apparent recurrence.  Continue calcium channel blocker.   4. HTN:  Controlled.    5. Hyperlipidemia:   Atorvastatin 40 mg QD.  LDL in 12/16 was 77.   6. Pulmonary Sarcoidosis:  FU with Pulmonology as planned.   7. Carotid Stenosis:  FU Carotid US due in 10/2015.    Medication Adjustments/Labs and Tests Ordered: Current medicines are reviewed at length with the patient today.  Concerns regarding medicines are outlined above.  Medication changes, Labs and Tests ordered today are outlined in the Patient Instructions noted below. Patient Instructions  Medication Instructions:  1. AN RX FOR NITROGLYCERIN HAS BEEN SENT IN AND YOU HAVE BEEN ADVISED AS TO HOW AND WHEN TO USE NTG  Labwork: 1. TODAY BMET,  CBC W/DIFF, PT/INR  Testing/Procedures: Your physician has requested that you have a cardiac catheterization. Cardiac catheterization is used to diagnose and/or treat various heart  conditions. Doctors may recommend this procedure for a number of different reasons. The most common reason is to evaluate chest pain. Chest pain can be a symptom of coronary artery disease (CAD), and cardiac catheterization can show whether plaque is narrowing or blocking your heart's arteries. This procedure is also used to evaluate the valves, as well as measure the blood flow and oxygen levels in different parts of your heart. For further information please visit [email or web address]. Please follow instruction sheet, as given.  Follow-Up: 06/04/15 @ 2:20 WITH Texie Tupou, PAC ; POST CATH FOLLOW UP   Any Other Special Instructions Will Be Listed Below (If Applicable).  If you need a refill on your cardiac medications before your next appointment, please call your pharmacy.   Signed, [name], PA-C  05/18/2015 10:26 AM    [facility] [address], [place], [place]  [address] Phone: [phone]; Fax: [phone]

## 2015-05-21 ENCOUNTER — Encounter (HOSPITAL_COMMUNITY): Payer: Self-pay | Admitting: Cardiovascular Disease

## 2015-05-25 ENCOUNTER — Telehealth: Payer: Self-pay | Admitting: Physician Assistant

## 2015-05-25 NOTE — Telephone Encounter (Signed)
I s/w pt about her swelling in her ankles. Pt states is getting better. Pt denies cp, sob, dizziness. Advised pt to weigh daily, call if wt up 3 lb's x 1 day or more sob, edema. Advised pt tp call Friday 4/7 w/wt's. Advised keep 4/13 appt w/PA. Pt agreeable to plan of care. I advised pt to call before Friday if wt is up. Pt is not on a diuretic. Pt verbalized understanding

## 2015-05-25 NOTE — Telephone Encounter (Signed)
Pt c/o swelling: STAT is pt has developed SOB within 24 hours  Pt had cath on 3/29- pt c/o swelling.Please call back and discuss.    1. How long have you been experiencing swelling? Friday - 3/31  2. Where is the swelling located? Both ankles   3.  Are you currently taking a "fluid pill"? no  4.  Are you currently SOB? No   5.  Have you traveled recently? No

## 2015-05-25 NOTE — Telephone Encounter (Signed)
Keeps leg elevated Watch salt Call if edema worsens or she has shortness of breath. Continue with current treatment plan. [name], PA-C   05/25/2015 5:37 PM

## 2015-05-29 ENCOUNTER — Telehealth: Payer: Self-pay | Admitting: Physician Assistant

## 2015-05-29 NOTE — Telephone Encounter (Signed)
See phone note from 4/3 . Pt called today as instructed 4/3 to let me know how her weight and swelling have been doing. Pt states weight is stable and swelling is better. Pt has appt with Nicki Reaper W. PA 4/13.

## 2015-05-29 NOTE — Telephone Encounter (Signed)
Pt returned your call--pls call [phone]

## 2015-06-04 ENCOUNTER — Encounter: Payer: Self-pay | Admitting: Physician Assistant

## 2015-06-04 ENCOUNTER — Ambulatory Visit (INDEPENDENT_AMBULATORY_CARE_PROVIDER_SITE_OTHER): Payer: Medicare Other | Admitting: Physician Assistant

## 2015-06-04 VITALS — BP 120/60 | HR 70 | Ht 63.0 in | Wt 131.4 lb

## 2015-06-04 DIAGNOSIS — I10 Essential (primary) hypertension: Secondary | ICD-10-CM | POA: Diagnosis not present

## 2015-06-04 DIAGNOSIS — I2510 Atherosclerotic heart disease of native coronary artery without angina pectoris: Secondary | ICD-10-CM

## 2015-06-04 DIAGNOSIS — R600 Localized edema: Secondary | ICD-10-CM

## 2015-06-04 DIAGNOSIS — I6523 Occlusion and stenosis of bilateral carotid arteries: Secondary | ICD-10-CM

## 2015-06-04 DIAGNOSIS — E785 Hyperlipidemia, unspecified: Secondary | ICD-10-CM

## 2015-06-04 DIAGNOSIS — I471 Supraventricular tachycardia: Secondary | ICD-10-CM

## 2015-06-04 MED ORDER — DILTIAZEM HCL ER COATED BEADS 180 MG PO CP24: 180.0000 mg | ORAL_CAPSULE | Freq: Every day | ORAL | Status: DC

## 2015-06-04 NOTE — Patient Instructions (Addendum)
Medication Instructions:  Stop taking Atorvastatin (Lipitor) for 4-6 weeks. If you feel better after stopping the Atorvastatin, let me know.  We can change your cholesterol medication to something that will hopefully not make you feel bad. If stopping the Atorvastatin does not make a difference in how you feel, please resume taking it and let me know. Labwork: None today Testing/Procedures: None  Follow-Up: [name], PA-C in 6 months.  Any Other Special Instructions Will Be Listed Below (If Applicable). Keep your legs elevated and wear compression stockings when you are going to be on your feet for a long time to help with your swelling.  You can get compression stockings at any pharmacy. If you need a refill on your cardiac medications before your next appointment, please call your pharmacy.

## 2015-06-04 NOTE — Progress Notes (Signed)
Cardiology Office Note:    Date:  06/04/2015   ID:  [name], DOB 1934-08-27, MRN [id number]  PCP:  Sherri Ringer, MD  Cardiologist:  Dr. Loralie Mcgrath  >> pt requests Dr. Sherren Mocha  Electrophysiologist:  n/a Pulmonology:  Dr. Lamonte Mcgrath  Chief Complaint  Patient presents with  . Hospitalization Follow-up    s/p cardiac cath    History of Present Illness:     [name] is a 81 y.o. female with a hx of pulmonary sarcoidosis, HTN, HL, SVT. Prior patient of Dr. Bing Quarry. Established with Dr. Loralie Mcgrath in 2014. In 10/15 she c/o worsening DOE. Echo demonstrated normal LVF and RVF and a nuclear stress test was low risk and neg for ischemia.   I saw her in 11/16. She had undergone a FU high resolution CT which demonstrated changes likely c/w sarcoidosis, bronchiectasis as well as 3 v calcification including the LM. She was asked to FU with cardiology. Follow-up nuclear stress test was arranged. This was low risk and negative for ischemia.   I saw her recently with c/o bilateral arm pain.  Symptoms were concerning for angina given know coronary calcification on CT scan.  I set her up for a LHC. This demonstrated mod non-obstructive 1 v CAD.  She had a 60-70% oLCx lesion that was not hemodynamically significant by FFR.  Med rx was recommended. She returns for FU.  She is doing well. No chest pain, dyspnea, syncope, orthopnea.  She notes dependent LE edema.  She notes fatigue and weakness that has been persistent for months.    Past Medical History  Diagnosis Date  . Paroxysmal supraventricular tachycardia ([place])   . Other chronic pulmonary heart diseases   . HLD (hyperlipidemia)   . Sarcoidosis ([name])   . Esophageal reflux   . Allergy     SEASONAL  . Arthritis   . Osteoporosis   . Carotid stenosis     a. Carotid US 9/15 - Bilateral 1-39% ICA >> FU 2 years  . Hematochezia   . Hx of colonoscopy   . CAD (coronary artery disease)     a. Myoview 10/15 -  normal EF 70% // Myoview 11/16: EF 75%, normal perfusion, (there was no blood pressure drop at low level exercise with Lexiscan infusion), low risk study // c. LHC 3/17 - LAD irregs, oLCx 70 (neg FFR), mRCA 30, EF 55-65% >> med Rx  . History of echocardiogram     a. Echo 10/15 - EF 60-65%, no RWMA    Past Surgical History  Procedure Laterality Date  . Trapezium resection Right   . Carpal tunnel release Left   . Shoulder arthroscopy w/ rotator cuff repair Right   . Foot surgery Right   . Bladder surgery    . Arm surgery Right   . Colonoscopy    . Polypectomy    . Hip surgery Right 2011    full replacement  . Leg surgery Left May 2014    femur fracture s/p open and closed reduction in [place], Dr. Jimmye Mcgrath  . Cardiac catheterization N/A 05/20/2015    Procedure: Left Heart Cath and Coronary Angiography;  Surgeon: Sherren Mocha, MD;  Location: [facility];  Service: Cardiovascular;  Laterality: N/A;  1. Hyperlipidemia 2. Sarcoidosis 3. H/o SVT 4. Carotid bruits: carotid dopplers (9/13) with minimal carotid stenosis.  5. Echo (10/10) with EF 60-65%, mild MR.  6. Femur fracture in 6/14 s/p repair.    Current Medications: Outpatient  Prescriptions Prior to Visit  Medication Sig Dispense Refill  . albuterol (PROVENTIL HFA;VENTOLIN HFA) 108 (90 BASE) MCG/ACT inhaler Inhale 2 puffs into the lungs every 4 (four) hours as needed for wheezing or shortness of breath. 1 Inhaler 6  . aspirin EC 81 MG tablet Take 1 tablet (81 mg total) by mouth daily.    Marland Kitchen atorvastatin (LIPITOR) 40 MG tablet Take 1 tablet (40 mg total) by mouth daily. 90 tablet 3  . fluticasone (FLONASE) 50 MCG/ACT nasal spray Place 2 sprays into both nostrils daily. (Patient taking differently: Place 2 sprays into both nostrils daily as needed for allergies. ) 16 g 5  . meloxicam (MOBIC) 15 MG tablet Take 15 mg by mouth as needed for pain.     Marland Kitchen MYRBETRIQ 50 MG TB24 tablet Take 50 mg by mouth daily.     . nitroGLYCERIN  (NITROSTAT) 0.4 MG SL tablet Place 1 tablet (0.4 mg total) under the tongue every 5 (five) minutes as needed for chest pain. 25 tablet 3  . omeprazole (PRILOSEC) 40 MG capsule Take 40 mg by mouth daily.     . traMADol (ULTRAM) 50 MG tablet Take 50 mg by mouth every 6 (six) hours as needed for severe pain.     . Vitamin D, Ergocalciferol, (DRISDOL) 50000 UNITS CAPS Take 50,000 Units by mouth every 7 (seven) days.     Marland Kitchen diltiazem (CARDIZEM CD) 180 MG 24 hr capsule Take 180 mg by mouth daily.       No facility-administered medications prior to visit.     Allergies:   Morphine; Oxycodone-acetaminophen; Percocet; and Sulfonamide derivatives   Social History   Social History  . Marital Status: Widowed    Spouse Name: N/A  . Number of Children: 1  . Years of Education: N/A   Occupational History  . retired     still works as Oceanographer  .     Social History Main Topics  . Smoking status: Never Smoker   . Smokeless tobacco: Never Used  . Alcohol Use: No  . Drug Use: No  . Sexual Activity: No   Other Topics Concern  . None   Social History Narrative     Family History:  The patient's family history includes Arrhythmia in her father; Cancer in her father and mother; Colon cancer (age of onset: 59) in her mother; Heart disease in her father; Lung cancer in her father. There is no history of Esophageal cancer, Rectal cancer, Stomach cancer, Heart attack, or Stroke.   ROS:   Please see the history of present illness.    Review of Systems  Cardiovascular: Positive for leg swelling.  Musculoskeletal: Positive for back pain.  All other systems reviewed and are negative.   Physical Exam:    VS:  BP 120/60 mmHg  Pulse 70  Ht 5\' 3"  (1.6 m)  Wt 131 lb 6.4 oz (59.603 kg)  BMI 23.28 kg/m2   GEN: Well nourished, well developed, in no acute distress HEENT: normal Neck: no JVD, no masses Cardiac: Normal S1/S2,  RRR; no murmurs, rubs, or gallops, trace - 1+ bilateral LE edema  with multiple varicosities; right wrist without hematoma or mass  Respiratory:  clear to auscultation bilaterally; no wheezing, rhonchi or rales GI: soft, nontender, nondistended MS: no deformity or atrophy Skin: warm and dry Neuro: No focal deficits  Psych: Alert and oriented x 3, normal affect  Wt Readings from Last 3 Encounters:  06/04/15 131 lb 6.4 oz (59.603 kg)  05/20/15 130 lb (58.968 kg)  05/18/15 130 lb (58.968 kg)      Studies/Labs Reviewed:     EKG:  EKG is  ordered today.  The ekg ordered today demonstrates NSR, HR 69, normal axis, septal Q waves, QTc 432 ms, no change from prior tracings.   Recent Labs: 02/10/2015: ALT 17 05/18/2015: BUN 23; Creat 0.85; Hemoglobin 13.2; Platelets 234; Potassium 4.4; Sodium 143   Recent Lipid Panel    Component Value Date/Time   CHOL 157 02/10/2015 0758   TRIG 66 02/10/2015 0758   HDL 67 02/10/2015 0758   CHOLHDL 2.3 02/10/2015 0758   VLDL 13 02/10/2015 0758   LDLCALC 77 02/10/2015 0758    Additional studies/ records that were reviewed today include:   LHC 05/20/15 LAD plaque - nonobstructive LCx ost 70% >> FFR 0.99 > 0.95 (not hemodynamically significant); mid 40% RCA mid 30% EF 55-65% 1. Moderate single-vessel CAD involving the ostium of the LCx. Interrogated with FFR which is negative. 2. Widely patent LAD and RCA. 3. Normal LV function. Recommendations: Medical therapy for nonobstructive CAD.  Myoview 11/16 EF 75%, no ischemia, low risk  High Resolution CT 12/02/14 IMPRESSION: 1. No findings to suggest interstitial lung disease. 2. The overall appearance the chest is very similar to the prior study 11/28/2012. Findings are nonspecific, but could be seen in the setting of reported sarcoidosis, as detailed above. 3. Areas of bronchiectasis are noted in the right lung, most severe in the medial segment of the right middle lobe where there are areas of cystic bronchiectasis and architectural distortion. 4.  Atherosclerosis, including left main and 3 vessel coronary artery disease. Assessment for potential risk factor modification, dietary therapy or pharmacologic therapy may be warranted, if clinically indicated. 5. Additional incidental findings, as above.  Myoview 10/15 Normal stress nuclear study. LV Ejection Fraction: 70%.  Echo 10/15 EF 60-65%, no RWMA  Carotid US 9/15 Bilateral 1-39% ICA >> FU 2 years   ASSESSMENT:     1. Coronary artery disease involving native coronary artery of native heart without angina pectoris   2. Paroxysmal supraventricular tachycardia ([place])   3. Essential hypertension   4. HLD (hyperlipidemia)   5. Carotid stenosis, bilateral   6. Bilateral edema of lower extremity     PLAN:     In order of problems listed above:  1. CAD - Mod 1v CAD.  LCx disease not sig by FFR.  Med Rx rec.  Continue statin, ASA.  She wants to FU with Dr. Burt Knack.  I will see her in 6 mos.  I can have her FU with Dr. Burt Knack after that.   2. PSVT:  No apparent recurrence.  Continue calcium channel blocker.  I do not think that Diltiazem is causing her fatigue.  Will try to take a holiday off of Atorvastatin.  If this does not help, could try to decrease Dilt to 120 QD.   3. HTN:  Controlled.    4. Hyperlipidemia:   She notes fatigue.  Question if related to Atorvastatin.  Hold Atorvastatin x 4-6 weeks.  If she feels better, will put her on Crestor 10 mg QD.   5. Carotid Stenosis:  FU Carotid US due in 10/2015.  6. Edema - Dependent edema 2/2 venous insuff.  Rec elevation, compression stockings.     Medication Adjustments/Labs and Tests Ordered: Current medicines are reviewed at length with the patient today.  Concerns regarding medicines are outlined above.  Medication changes, Labs and Tests ordered today  are outlined in the Patient Instructions noted below. Patient Instructions  Medication Instructions:  Stop taking Atorvastatin (Lipitor) for 4-6 weeks. If you feel better  after stopping the Atorvastatin, let me know.  We can change your cholesterol medication to something that will hopefully not make you feel bad. If stopping the Atorvastatin does not make a difference in how you feel, please resume taking it and let me know. Labwork: None today Testing/Procedures: None  Follow-Up: [name], PA-C in 6 months.  Any Other Special Instructions Will Be Listed Below (If Applicable). Keep your legs elevated and wear compression stockings when you are going to be on your feet for a long time to help with your swelling.  You can get compression stockings at any pharmacy. If you need a refill on your cardiac medications before your next appointment, please call your pharmacy.    Signed, [name], PA-C  06/04/2015 2:49 PM    [facility] [place], [place], [place]  [address] Phone: [phone]; Fax: [phone]

## 2015-06-08 NOTE — Addendum Note (Signed)
Addended by: Freada Bergeron on: 06/08/2015 11:03 AM   Modules accepted: Orders

## 2015-06-29 ENCOUNTER — Telehealth: Payer: Self-pay | Admitting: Physician Assistant

## 2015-06-29 NOTE — Telephone Encounter (Signed)
[name] is calling because [name] had told her to stay off of the cholesterol medication (Atorvastatin) and to call back to tell him how she is doing on it . Please call

## 2015-07-01 NOTE — Telephone Encounter (Signed)
Follow up     Patient calling    Pt c/o medication issue:  1. Name of Medication: atorvastatin   2. How are you currently taking this medication (dosage and times per day)? 40 mg - one time  Day   3. Are you having a reaction (difficulty breathing--STAT)? Fatigue - discuss with [name] when she was in the office    4. What is your medication issue? Need to talk with Arbie Cookey

## 2015-07-01 NOTE — Telephone Encounter (Signed)
The pt states that at her last OV with [name], PA-c on 06/04/15 she was advised to stop taking Atorvastatin due to fatigue and to call us in 4 to 6 weeks to let us know if her fatigue has improved/gone away. She reports that she is unsure at this time if her fatigue is better or not but that she does feel that maybe she is less fatigued.  I have advised her to continue to hold her Atorvastatin for 1 more week to see if she is better then call us back to let us know. She verbalized understanding and is in agreement with the plan.  She is aware that I am forwarding this message to [name], PA-c and Arbie Cookey, his CMA as Juluis Rainier.

## 2015-07-02 NOTE — Telephone Encounter (Signed)
Ok to hold 1 more week. If no change, resume Atorvastatin and FU with PCP for fatigue. [name], PA-C   07/02/2015 5:28 PM

## 2015-07-03 NOTE — Telephone Encounter (Signed)
The pt is advised and she verbalized understanding. She states that if her fatigue does improve/go away within this week she will call to let us know, otherwise she will resume Atorvastatin and refer to her PCP concerning her fatigue.

## 2015-07-09 ENCOUNTER — Telehealth: Payer: Self-pay | Admitting: Physician Assistant

## 2015-07-09 DIAGNOSIS — I2510 Atherosclerotic heart disease of native coronary artery without angina pectoris: Secondary | ICD-10-CM

## 2015-07-09 MED ORDER — ATORVASTATIN CALCIUM 40 MG PO TABS: 20.0000 mg | ORAL_TABLET | ORAL | Status: DC

## 2015-07-09 NOTE — Telephone Encounter (Signed)
F./u  Pt stated she was told to call back today and speak w/ [name]. Please call back and discuss.

## 2015-07-09 NOTE — Telephone Encounter (Signed)
Change to Lipitor 20 mg and take every Monday and Friday for 3 weeks, then increase to every Monday, Wednesday, Friday. [name], PA-C   07/09/2015 5:31 PM

## 2015-07-09 NOTE — Telephone Encounter (Signed)
S/w pt to see how she been feeling off the Lipitor. Pt states fatigued feeling is better. Pt said shewants to make sure it was the Lipitor causing fatigue. Pt wants to try going back on lipitor to see if she starts to feel fatigued again. I advice that she could try this as an option or we could just switch her to something else. Pt states she has tried Pravastatin before, though did not have good results with this. Pt wants to just try the lipitor again and she will let us know if she begins to feel fatigued again. Pt will resume Lipitor 40 mg daily. I will route this note to [place] as well to update him as to pt status.

## 2015-07-09 NOTE — Telephone Encounter (Signed)
S/w went over recommendations from PA ok to resume Lipitor. Start lipitor 20 mg on Mon and Fri's only for 3 weeks then if ok increase to lipitor 20 mg Mon, Wed and Fri's. Pt agreeable to plan of care.

## 2015-07-09 NOTE — Addendum Note (Signed)
Addended by: Michae Kava on: 07/09/2015 05:50 PM   Modules accepted: Orders

## 2015-07-31 ENCOUNTER — Telehealth: Payer: Self-pay | Admitting: Physician Assistant

## 2015-07-31 DIAGNOSIS — E785 Hyperlipidemia, unspecified: Secondary | ICD-10-CM

## 2015-07-31 NOTE — Telephone Encounter (Signed)
New Message  Pt c/o medication issue: 1. Name of Medication: atorvastatin (LIPITOR) 40 MG tablet  4. What is your medication issue? Pt called states that she was taken off of this medication for several weeks and per pt she was restrted on it with 1/2 a pill. But she states that she is still having problems of fatigue.  Pt states that now its coming back again.

## 2015-07-31 NOTE — Telephone Encounter (Signed)
Spoke with pt and she states that she has been taking the Lipitor as prescribed and her fatigue has returned. Pt denies any other issues with medication at this time. Pt states she was to call back if fatigue returned so [name], PA-C could put her on a different medication. Advised pt that I would send message to [facility] for review and advisement.

## 2015-08-03 ENCOUNTER — Other Ambulatory Visit: Payer: Self-pay | Admitting: Physician Assistant

## 2015-08-03 MED ORDER — ROSUVASTATIN CALCIUM 10 MG PO TABS: ORAL_TABLET | ORAL | Status: DC

## 2015-08-03 NOTE — Telephone Encounter (Addendum)
DC Lipitor Start Crestor 10 mg - take twice a week only. Lipids and LFTs in 3 months. [name], PA-C   08/03/2015 2:09 PM

## 2015-08-03 NOTE — Telephone Encounter (Signed)
Follow up   Pt is calling back for Arbie Cookey and she spoke to [employer] on 07-31-15   Pt verbalized that jennifer told her that pt will get a returned call to see if   [name] will write her a new prescription  She stated that she don't want a refill

## 2015-08-03 NOTE — Addendum Note (Signed)
Addended by: Katrine Coho on: 08/03/2015 02:21 PM   Modules accepted: Orders, Medications

## 2015-08-03 NOTE — Telephone Encounter (Signed)
Pt advised, verbalized understanding, agreed with plan, fasting lipid/liver profile scheduled for 11/03/15.

## 2015-08-12 ENCOUNTER — Ambulatory Visit (INDEPENDENT_AMBULATORY_CARE_PROVIDER_SITE_OTHER): Payer: Medicare Other

## 2015-08-12 ENCOUNTER — Ambulatory Visit (INDEPENDENT_AMBULATORY_CARE_PROVIDER_SITE_OTHER): Payer: Medicare Other | Admitting: Podiatry

## 2015-08-12 DIAGNOSIS — M79672 Pain in left foot: Secondary | ICD-10-CM | POA: Diagnosis not present

## 2015-08-12 DIAGNOSIS — I6523 Occlusion and stenosis of bilateral carotid arteries: Secondary | ICD-10-CM

## 2015-08-12 DIAGNOSIS — M216X9 Other acquired deformities of unspecified foot: Secondary | ICD-10-CM | POA: Diagnosis not present

## 2015-08-12 DIAGNOSIS — L84 Corns and callosities: Secondary | ICD-10-CM | POA: Diagnosis not present

## 2015-08-13 NOTE — Progress Notes (Signed)
Subjective:     Patient ID: [name], female   DOB: 1934-11-05, 81 y.o.   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 At this point I do not recommend surgery for this patient but recommended continued debridement thicker-type shoe gear and not going barefoot. Debridement accomplished and we again discussed surgery and her to hold off currently

## 2015-09-15 ENCOUNTER — Ambulatory Visit (INDEPENDENT_AMBULATORY_CARE_PROVIDER_SITE_OTHER): Payer: Medicare Other | Admitting: Emergency Medicine

## 2015-09-15 ENCOUNTER — Encounter: Payer: Self-pay | Admitting: Emergency Medicine

## 2015-09-15 DIAGNOSIS — I6523 Occlusion and stenosis of bilateral carotid arteries: Secondary | ICD-10-CM

## 2015-09-15 DIAGNOSIS — J309 Allergic rhinitis, unspecified: Secondary | ICD-10-CM | POA: Insufficient documentation

## 2015-09-15 DIAGNOSIS — R059 Cough, unspecified: Secondary | ICD-10-CM

## 2015-09-15 DIAGNOSIS — R05 Cough: Secondary | ICD-10-CM

## 2015-09-15 DIAGNOSIS — D869 Sarcoidosis, unspecified: Secondary | ICD-10-CM | POA: Diagnosis not present

## 2015-09-15 MED ORDER — FLUTICASONE PROPIONATE 50 MCG/ACT NA SUSP: 2.0000 | Freq: Every day | NASAL | 5 refills | Status: DC

## 2015-09-15 MED ORDER — LORATADINE 10 MG PO TABS: 10.0000 mg | ORAL_TABLET | Freq: Every day | ORAL | 11 refills | Status: DC

## 2015-09-15 NOTE — Assessment & Plan Note (Signed)
Loratadine and fluticasone nasal spray

## 2015-09-15 NOTE — Assessment & Plan Note (Signed)
No repeat CT scan indicated at this time given her stability. I will repeat in the future probably in December 2018.

## 2015-09-15 NOTE — Patient Instructions (Signed)
Please restart loratadine 10mg  daily (generic for Claritin)  Restart fluticasone nasal spray, 2 sprays each side daily. Once your congestion improves you can try decreasing this to just using as needed.  Keep your albuterol available to use as needed.  We will not repeat your CT chest at this time. We will discuss the timing of a repeat scan at future visit, will probably do in 01/2017.  Follow with Dr [name] in 6 months or sooner if you have any problems

## 2015-09-15 NOTE — Progress Notes (Signed)
Subjective:    Patient ID: [name], female    DOB: 05-May-1934, 81 y.o.   MRN: [id number]  HPI 81 yo woman, never smoker, hx sarcoidosis that was dx by Dr Lucia Gaskins in [place] after FOB (? Whether she had biopsies or not), PSVT, GERD. She also had a hx severe PNA with empyema and chest tube (90's). She has DOE and difficulty walking. The decrease in function has come on gradually. Her cardiac stress test was negative as below. PFT show obstruction with a DLCO defect.   PFT 12/06/13 >> severe AFL without BD response.   ROV 02/25/14 -- follow up for progressive dyspnea in setting sarcoidosis. Last time we decided to initiate short acting beta agonist to see if she would benefit. Walking oximetry was reassuring. She has been doing a bit better - she believes that she does better now that the hot weather is passed. She used SABA a few times and felt that it helped her some. She is asking about pneumonia vaccine.   ROV 09/18/14 -- follow-up visit for history of sarcoidosis. Her last visit we started Symbicort to see if she would benefit.  She used the symbicort about qd, started to forget it. She isn't sure that she felt better on it. She reports that she had an acute flare in June, followed a trip to [place]. She required treatment with pred and abx. She has albuterol that she uses prn, very rarely. She is otherwise well, no pulm limitations at baseline.   ROV1/26/17 -- patient has history of sarcoidosis, severe obstructive lung disease. Also a history of a prior empyema that required chest use drainage. She underwent a repeat CT scan of her chest in October 2016 that confirmed some areas of bronchiectasis and architectural distortion in the right middle lobe and elsewhere. There was no interstitial disease and very little change compared with 2014.   She stopped Symbicort ;last visit, has not really missed it. She is breathing well, but is having problems with nasal gtt, R ear fullness, a hacking cough. She  moved end of October - new exposures, new home.  Has not needed SABA.                 ROV 09/15/15 -- patient follows up for her history of severe obstructive lung disease in the setting of sarcoidosis. She's also had a history of empyema and chest tube drainage.  Also with a history of chronic rhinitis with associated cough. I last saw her in January 2017 and at that time we started a nasal steroid and loratadine. She took reliably for a while, but then tried stopping it to see what would happen.  She is having a lot of drainage and sneezing now.  Last CT chest stable in 11/2014.  She has SABA available to use but has not required it. She has some SOB during the hot months, otherwise no sx.  Review of Systems  Constitutional: Negative for fever and unexpected weight change.  HENT: Negative for congestion, dental problem, ear pain, nosebleeds, postnasal drip, rhinorrhea, sinus pressure, sneezing, sore throat and trouble swallowing.   Eyes: Negative for redness and itching.  Respiratory: Negative for cough, chest tightness, shortness of breath and wheezing.   Cardiovascular: Negative for palpitations and leg swelling.  Gastrointestinal: Negative for nausea and vomiting.  Genitourinary: Negative for dysuria.  Musculoskeletal: Negative for joint swelling.  Skin: Negative for rash.  Neurological: Negative for headaches.  Hematological: Does not bruise/bleed easily.  Psychiatric/Behavioral: Negative for dysphoric mood. The patient is not nervous/anxious.       Objective:   Physical Exam Vitals:   09/15/15 1133  BP: 120/66  Pulse: 60  SpO2: 96%  Weight: 129 lb (58.5 kg)  Height: 5\' 3"  (1.6 m)   Gen: Pleasant,  kyphotic elderly woman, in no distress,  normal affect  ENT: No lesions,  mouth clear,  oropharynx clear, no postnasal drip  Neck: No JVD, no TMG, no carotid bruits  Lungs: No use of accessory muscles, clear without rales or rhonchi  Cardiovascular: RRR, heart sounds normal, no murmur or gallops, no peripheral edema  Musculoskeletal: No deformities, no cyanosis or clubbing  Neuro: alert, non focal  Skin: Warm, no lesions or rashes   12/02/14 --  COMPARISON: Chest CT 11/28/2012.  FINDINGS: Mediastinum/Lymph Nodes: Heart size is normal. There is no significant pericardial fluid, thickening or pericardial calcification. There is atherosclerosis of the thoracic aorta, the great vessels of the mediastinum and the coronary arteries, including calcified atherosclerotic plaque in the left main, left anterior descending, left circumflex and right coronary arteries. No pathologically enlarged mediastinal or hilar lymph nodes. Numerous densely calcified mediastinal and right hilar lymph nodes are noted. Esophagus is unremarkable in appearance. No axillary lymphadenopathy.  Lungs/Pleura: Focal area of cystic bronchiectasis and architectural distortion in the medial segment of the right middle lobe. Some other scattered areas of mild cylindrical bronchiectasis are noted in the right lung. Chronic area of scarring in the posterior aspect of the right lower lobe is unchanged compared to the prior examination. High-resolution images demonstrate no additional areas of ground-glass attenuation, subpleural reticulation, parenchymal banding or honeycombing to suggest interstitial lung disease. No acute consolidative airspace disease. No pleural effusions. No suspicious appearing pulmonary nodules or masses.  Upper abdomen: Incompletely visualized low-attenuation lesion measuring 1.6 cm in diameter adjacent to the falciform ligament is incompletely characterized, but is similar to remote  prior study 05/10/2006 and statistically likely a cyst.  Musculoskeletal: Multiple old healed posterior right-sided rib fractures are noted. Old compression fracture of T11 with approximately 20% loss of anterior vertebral body height. There are no aggressive appearing lytic or blastic lesions noted in the visualized portions of the skeleton.  IMPRESSION: 1. No findings to suggest interstitial lung disease. 2. The overall appearance the chest is very similar to the prior study 11/28/2012. Findings are nonspecific, but could be seen in the setting of reported sarcoidosis, as detailed above. 3. Areas of bronchiectasis are noted in the right lung, most severe in the medial segment of the right middle lobe where there are areas of cystic bronchiectasis and architectural distortion. 4. Atherosclerosis, including left main and 3 vessel coronary artery disease. Assessment for potential risk factor modification, dietary therapy or pharmacologic therapy may be warranted, if clinically indicated. 5. Additional incidental findings, as above   12/10/13 --  Myocardial perfusion scan >> normal perfusion       Assessment &  Plan:  Cough Being driven primarily by her rhinitis and congestion at this time. She benefited when she was on an allergy regimen, stopped it to see if she would have recurrent symptoms. She is now having more congestion, more cough and some stridor at night. We will restart her loratadine and her fluticasone nasal spray  SARCOIDOSIS, PULMONARY No repeat CT scan indicated at this time given her stability. I will repeat in the future probably in December 2018.  Allergic rhinitis Loratadine and fluticasone nasal spray  Baltazar Apo, MD, PhD 09/15/2015, 12:00 PM [facility] [phone] or if no answer [phone]

## 2015-09-15 NOTE — Assessment & Plan Note (Signed)
Being driven primarily by her rhinitis and congestion at this time. She benefited when she was on an allergy regimen, stopped it to see if she would have recurrent symptoms. She is now having more congestion, more cough and some stridor at night. We will restart her loratadine and her fluticasone nasal spray

## 2015-09-25 ENCOUNTER — Encounter: Payer: Self-pay | Admitting: Physician Assistant

## 2015-10-13 ENCOUNTER — Encounter (HOSPITAL_COMMUNITY): Payer: Self-pay

## 2015-10-13 ENCOUNTER — Other Ambulatory Visit (HOSPITAL_COMMUNITY): Payer: Self-pay | Admitting: Internal Medicine

## 2015-10-13 ENCOUNTER — Ambulatory Visit (HOSPITAL_COMMUNITY)
Admission: RE | Admit: 2015-10-13 | Discharge: 2015-10-13 | Disposition: A | Discharge status: Home / Self Care | Payer: Medicare Other | Source: Ambulatory Visit | Attending: Internal Medicine | Admitting: Internal Medicine

## 2015-10-13 DIAGNOSIS — M810 Age-related osteoporosis without current pathological fracture: Secondary | ICD-10-CM | POA: Insufficient documentation

## 2015-10-13 MED ORDER — DENOSUMAB 60 MG/ML ~~LOC~~ SOLN
60.0000 mg | Freq: Once | SUBCUTANEOUS | Status: AC
  Administered 2015-10-13: 60 mg via SUBCUTANEOUS
  Filled 2015-10-13: qty 1

## 2015-10-13 NOTE — Progress Notes (Signed)
Pt takes vitamin d and calcium daily.  D/c instructions given on prolia.  Next appointment given also.  Pt d/c ambulatory to lobby

## 2015-10-13 NOTE — Discharge Instructions (Signed)
Denosumab injection  What is this medicine?  DENOSUMAB (den oh sue mab) slows bone breakdown. Prolia is used to treat osteoporosis in women after menopause and in men. Xgeva is used to prevent bone fractures and other bone problems caused by cancer bone metastases. Xgeva is also used to treat giant cell tumor of the bone.  This medicine may be used for other purposes; ask your health care provider or pharmacist if you have questions.  What should I tell my health care provider before I take this medicine?  They need to know if you have any of these conditions:  -dental disease  -eczema  -infection or history of infections  -kidney disease or on dialysis  -low blood calcium or vitamin D  -malabsorption syndrome  -scheduled to have surgery or tooth extraction  -taking medicine that contains denosumab  -thyroid or parathyroid disease  -an unusual reaction to denosumab, other medicines, foods, dyes, or preservatives  -pregnant or trying to get pregnant  -breast-feeding  How should I use this medicine?  This medicine is for injection under the skin. It is given by a health care professional in a hospital or clinic setting.  If you are getting Prolia, a special MedGuide will be given to you by the pharmacist with each prescription and refill. Be sure to read this information carefully each time.  For Prolia, talk to your pediatrician regarding the use of this medicine in children. Special care may be needed. For Xgeva, talk to your pediatrician regarding the use of this medicine in children. While this drug may be prescribed for children as young as 13 years for selected conditions, precautions do apply.  Overdosage: If you think you have taken too much of this medicine contact a poison control center or emergency room at once.  NOTE: This medicine is only for you. Do not share this medicine with others.  What if I miss a dose?  It is important not to miss your dose. Call your doctor or health care professional if you are  unable to keep an appointment.  What may interact with this medicine?  Do not take this medicine with any of the following medications:  -other medicines containing denosumab  This medicine may also interact with the following medications:  -medicines that suppress the immune system  -medicines that treat cancer  -steroid medicines like prednisone or cortisone  This list may not describe all possible interactions. Give your health care provider a list of all the medicines, herbs, non-prescription drugs, or dietary supplements you use. Also tell them if you smoke, drink alcohol, or use illegal drugs. Some items may interact with your medicine.  What should I watch for while using this medicine?  Visit your doctor or health care professional for regular checks on your progress. Your doctor or health care professional may order blood tests and other tests to see how you are doing.  Call your doctor or health care professional if you get a cold or other infection while receiving this medicine. Do not treat yourself. This medicine may decrease your body's ability to fight infection.  You should make sure you get enough calcium and vitamin D while you are taking this medicine, unless your doctor tells you not to. Discuss the foods you eat and the vitamins you take with your health care professional.  See your dentist regularly. Brush and floss your teeth as directed. Before you have any dental work done, tell your dentist you are receiving this medicine.  Do   not become pregnant while taking this medicine or for 5 months after stopping it. Women should inform their doctor if they wish to become pregnant or think they might be pregnant. There is a potential for serious side effects to an unborn child. Talk to your health care professional or pharmacist for more information.  What side effects may I notice from receiving this medicine?  Side effects that you should report to your doctor or health care professional as soon as  possible:  -allergic reactions like skin rash, itching or hives, swelling of the face, lips, or tongue  -breathing problems  -chest pain  -fast, irregular heartbeat  -feeling faint or lightheaded, falls  -fever, chills, or any other sign of infection  -muscle spasms, tightening, or twitches  -numbness or tingling  -skin blisters or bumps, or is dry, peels, or red  -slow healing or unexplained pain in the mouth or jaw  -unusual bleeding or bruising  Side effects that usually do not require medical attention (Report these to your doctor or health care professional if they continue or are bothersome.):  -muscle pain  -stomach upset, gas  This list may not describe all possible side effects. Call your doctor for medical advice about side effects. You may report side effects to FDA at 1-800-FDA-1088.  Where should I keep my medicine?  This medicine is only given in a clinic, doctor's office, or other health care setting and will not be stored at home.  NOTE: This sheet is a summary. It may not cover all possible information. If you have questions about this medicine, talk to your doctor, pharmacist, or health care provider.      2016, Elsevier/Gold Standard. (2011-08-08 12:37:47)

## 2015-10-29 ENCOUNTER — Ambulatory Visit (INDEPENDENT_AMBULATORY_CARE_PROVIDER_SITE_OTHER): Payer: Medicare Other | Admitting: Podiatry

## 2015-10-29 ENCOUNTER — Encounter: Payer: Self-pay | Admitting: Podiatry

## 2015-10-29 DIAGNOSIS — L84 Corns and callosities: Secondary | ICD-10-CM | POA: Diagnosis not present

## 2015-11-01 NOTE — Progress Notes (Signed)
Subjective:     Patient ID: [name], female   DOB: Feb 27, 1934, 81 y.o.   MRN: [id number]  HPI patient presents stating I'm feeling some better but I still get a lot of pain in the bottom of the foot with ambulation   Review of Systems     Objective:   Physical Exam Neurovascular status intact with keratotic lesion noted that is painful when pressed    Assessment:     Lesion secondary to plantarflex metatarsal    Plan:     Advised on structural malalignment and went ahead today and did deep debridement of lesion which will be done on a continuing basis as needed

## 2015-11-03 ENCOUNTER — Other Ambulatory Visit (INDEPENDENT_AMBULATORY_CARE_PROVIDER_SITE_OTHER): Payer: Medicare Other | Admitting: *Deleted

## 2015-11-03 DIAGNOSIS — E785 Hyperlipidemia, unspecified: Secondary | ICD-10-CM | POA: Diagnosis not present

## 2015-11-03 LAB — HEPATIC FUNCTION PANEL
ALBUMIN: 4 g/dL (ref 3.6–5.1)
ALK PHOS: 62 U/L (ref 33–130)
ALT: 11 U/L (ref 6–29)
AST: 16 U/L (ref 10–35)
BILIRUBIN DIRECT: 0.1 mg/dL (ref <=0.2)
BILIRUBIN INDIRECT: 0.4 mg/dL (ref 0.2–1.2)
BILIRUBIN TOTAL: 0.5 mg/dL (ref 0.2–1.2)
Total Protein: 6.3 g/dL (ref 6.1–8.1)

## 2015-11-03 LAB — LIPID PANEL
Cholesterol: 156 mg/dL (ref 125–200)
HDL: 79 mg/dL (ref >=46)
LDL CALC: 63 mg/dL (ref <130)
Total CHOL/HDL Ratio: 2 Ratio (ref <=5.0)
Triglycerides: 70 mg/dL (ref <150)
VLDL: 14 mg/dL (ref <30)

## 2015-11-04 ENCOUNTER — Other Ambulatory Visit: Payer: Self-pay | Admitting: *Deleted

## 2015-11-04 MED ORDER — ROSUVASTATIN CALCIUM 10 MG PO TABS: ORAL_TABLET | ORAL | 3 refills | Status: DC

## 2015-11-13 ENCOUNTER — Encounter: Payer: Self-pay | Admitting: *Deleted

## 2015-12-01 ENCOUNTER — Ambulatory Visit: Payer: Medicare Other | Admitting: Physician Assistant

## 2015-12-02 ENCOUNTER — Ambulatory Visit (INDEPENDENT_AMBULATORY_CARE_PROVIDER_SITE_OTHER): Payer: Medicare Other | Admitting: Physician Assistant

## 2015-12-02 ENCOUNTER — Encounter: Payer: Self-pay | Admitting: Physician Assistant

## 2015-12-02 VITALS — BP 120/50 | HR 62 | Ht 63.0 in | Wt 129.1 lb

## 2015-12-02 DIAGNOSIS — I779 Disorder of arteries and arterioles, unspecified: Secondary | ICD-10-CM

## 2015-12-02 DIAGNOSIS — I10 Essential (primary) hypertension: Secondary | ICD-10-CM | POA: Diagnosis not present

## 2015-12-02 DIAGNOSIS — I471 Supraventricular tachycardia: Secondary | ICD-10-CM

## 2015-12-02 DIAGNOSIS — I6523 Occlusion and stenosis of bilateral carotid arteries: Secondary | ICD-10-CM

## 2015-12-02 DIAGNOSIS — E7800 Pure hypercholesterolemia, unspecified: Secondary | ICD-10-CM

## 2015-12-02 DIAGNOSIS — I2510 Atherosclerotic heart disease of native coronary artery without angina pectoris: Secondary | ICD-10-CM | POA: Diagnosis not present

## 2015-12-02 DIAGNOSIS — I739 Peripheral vascular disease, unspecified: Secondary | ICD-10-CM

## 2015-12-02 DIAGNOSIS — R5383 Other fatigue: Secondary | ICD-10-CM

## 2015-12-02 NOTE — Progress Notes (Signed)
Cardiology Office Note:    Date:  12/02/2015   ID:  [name], DOB 1934/08/08, MRN [id number]  PCP:  [name], MD  Cardiologist:  Dr. Loralie Champagne  >> pt requests Dr. Sherren Mocha  Electrophysiologist:  n/a Pulmonology:  Dr. [name]  Referring MD: [name], MD   Chief Complaint  Patient presents with  . Follow-up    CAD    History of Present Illness:    [name] is a 81 y.o. female with a hx of CAD, pulmonary sarcoidosis, HTN, HL, SVT.  Echo in 10/15 demonstrated normal LVF.  Myoview in 10/15 demonstrated no ischemia.  She had a high res CT in 10/16 with coronary calcification. Myoview in 11/16 was neg for ischemia. She then had symptoms of bilateral arm pain concerning for angina and LHC in 3/17 demonstrated ostial LCx stenosis of 60-70%  That was not hemodynamically significant by FFR.  Last seen by me in 4/17.    Returns for FU.  Here alone.  She is doing well. She has chronic dyspnea on exertion.  She denies chest pain, syncope, orthopnea, PND, edema. She denies bleeding issues.  Her breathing is worse in the humid weather.  She does note significant fatigue.  She would like her B12 checked.   Prior CV studies that were reviewed today include:    LHC 05/20/15 LAD plaque - nonobstructive LCx ost 70% >> FFR 0.99 > 0.95 (not hemodynamically significant); mid 40% RCA mid 30% EF 55-65% 1. Moderate single-vessel CAD involving the ostium of the LCx. Interrogated with FFR which is negative. 2. Widely patent LAD and RCA. 3. Normal LV function. Recommendations: Medical therapy for nonobstructive CAD.  Myoview 11/16 EF 75%, no ischemia, low risk  High Resolution CT 12/02/14 IMPRESSION: 1. No findings to suggest interstitial lung disease. 2. The overall appearance the chest is very similar to the prior study 11/28/2012. Findings are nonspecific, but could be seen in the setting of reported sarcoidosis, as detailed above. 3. Areas of bronchiectasis  are noted in the right lung, most severe in the medial segment of the right middle lobe where there are areas of cystic bronchiectasis and architectural distortion. 4. Atherosclerosis, including left main and 3 vessel coronary artery disease. Assessment for potential risk factor modification, dietary therapy or pharmacologic therapy may be warranted, if clinically indicated. 5. Additional incidental findings, as above.  Myoview 10/15 Normal stress nuclear study. LV Ejection Fraction: 70%.  Echo 10/15 EF 60-65%, no RWMA  Carotid US 9/15 Bilateral 1-39% ICA >> FU 2 years  Past Medical History:  Diagnosis Date  . Allergy    SEASONAL  . Arthritis   . CAD (coronary artery disease)    a. Myoview 10/15 - normal EF 70% // Myoview 11/16: EF 75%, normal perfusion, (there was no blood pressure drop at low level exercise with Lexiscan infusion), low risk study // c. LHC 3/17 - LAD irregs, oLCx 70 (neg FFR), mRCA 30, EF 55-65% >> med Rx  . Carotid stenosis    a. Carotid US 9/15 - Bilateral 1-39% ICA >> FU 2 years  . Esophageal reflux   . Hematochezia   . History of echocardiogram    a. Echo 10/15 - EF 60-65%, no RWMA  . HLD (hyperlipidemia)   . Hx of colonoscopy   . Osteoporosis   . Other chronic pulmonary heart diseases   . Paroxysmal supraventricular tachycardia ([name])   . Sarcoidosis [facility])     Past Surgical History:  Procedure Laterality  Date  . arm surgery Right   . BLADDER SURGERY    . CARDIAC CATHETERIZATION N/A 05/20/2015   Procedure: Left Heart Cath and Coronary Angiography;  Surgeon: Sherren Mocha, MD;  Location: [facility];  Service: Cardiovascular;  Laterality: N/A;  . CARPAL TUNNEL RELEASE Left   . COLONOSCOPY    . FOOT SURGERY Right   . HIP SURGERY Right 2011   full replacement  . LEG SURGERY Left May 2014   femur fracture s/p open and closed reduction in [place], Dr. Jimmye Mcgrath  . POLYPECTOMY    . SHOULDER ARTHROSCOPY W/ ROTATOR CUFF REPAIR Right   .  TRAPEZIUM RESECTION Right     Current Medications: Current Meds  Medication Sig  . albuterol (PROVENTIL HFA;VENTOLIN HFA) 108 (90 BASE) MCG/ACT inhaler Inhale 2 puffs into the lungs every 4 (four) hours as needed for wheezing or shortness of breath.  Marland Kitchen aspirin EC 81 MG tablet Take 1 tablet (81 mg total) by mouth daily.  . calcium carbonate (TUMS - DOSED IN MG ELEMENTAL CALCIUM) 500 MG chewable tablet Chew 1 tablet by mouth daily. Pt isn't sure of dose.  . diltiazem (CARDIZEM CD) 180 MG 24 hr capsule Take 1 capsule (180 mg total) by mouth daily.  . fluticasone (FLONASE) 50 MCG/ACT nasal spray Place 2 sprays into both nostrils daily.  Marland Kitchen loratadine (CLARITIN) 10 MG tablet Take 1 tablet (10 mg total) by mouth daily.  . meloxicam (MOBIC) 15 MG tablet Take 15 mg by mouth as needed for pain.   Marland Kitchen MYRBETRIQ 50 MG TB24 tablet Take 50 mg by mouth daily.   . nitroGLYCERIN (NITROSTAT) 0.4 MG SL tablet Place 1 tablet (0.4 mg total) under the tongue every 5 (five) minutes as needed for chest pain.  Marland Kitchen omeprazole (PRILOSEC) 40 MG capsule Take 40 mg by mouth daily.   . rosuvastatin (CRESTOR) 10 MG tablet Take 1 tablet by mouth two times per week only  . traMADol (ULTRAM) 50 MG tablet Take 50 mg by mouth every 6 (six) hours as needed for severe pain.   . Vitamin D, Ergocalciferol, (DRISDOL) 50000 UNITS CAPS Take 50,000 Units by mouth every 7 (seven) days.      Allergies:   Morphine; Oxycodone-acetaminophen; Percocet [oxycodone-acetaminophen]; and Sulfonamide derivatives   Social History   Social History  . Marital status: Widowed    Spouse name: N/A  . Number of children: 1  . Years of education: N/A   Occupational History  . retired     still works as Oceanographer  .  Retired   Social History Main Topics  . Smoking status: Never Smoker  . Smokeless tobacco: Never Used  . Alcohol use No  . Drug use: No  . Sexual activity: No   Other Topics Concern  . None   Social History Narrative    . None     Family History:  The patient's family history includes Arrhythmia in her father; Cancer in her father and mother; Colon cancer (age of onset: 33) in her mother; Heart disease in her father; Lung cancer in her father.   ROS:   Please see the history of present illness.    Review of Systems  Cardiovascular: Positive for dyspnea on exertion.   All other systems reviewed and are negative.   EKGs/Labs/Other Test Reviewed:    EKG:  EKG is  ordered today.  The ekg ordered today demonstrates NSR, HR 62, normal axis, PVC, septal Q waves, nonspecific ST-T wave changes,  QTc 422 ms, no change from prior ECG dated 06/04/15  Recent Labs: 05/18/2015: BUN 23; Creat 0.85; Hemoglobin 13.2; Platelets 234; Potassium 4.4; Sodium 143 11/03/2015: ALT 11  Labs from PCP 4/17: TSH 3.290 Labs from PCP 10/17: LDL 91, Creatinine 0.80, ALT 9, Hgb 13.7  Recent Lipid Panel    Component Value Date/Time   CHOL 156 11/03/2015 0742   TRIG 70 11/03/2015 0742   HDL 79 11/03/2015 0742   CHOLHDL 2.0 11/03/2015 0742   VLDL 14 11/03/2015 0742   LDLCALC 63 11/03/2015 0742     Physical Exam:    VS:  BP (!) 120/50   Pulse 62   Ht 5\' 3"  (1.6 m)   Wt 129 lb 1.9 oz (58.6 kg)   BMI 22.87 kg/m     Wt Readings from Last 3 Encounters:  12/02/15 129 lb 1.9 oz (58.6 kg)  10/13/15 131 lb 6.4 oz (59.6 kg)  09/15/15 129 lb (58.5 kg)     Physical Exam  Constitutional: She is oriented to person, place, and time. She appears well-developed and well-nourished. No distress.  HENT:  Head: Normocephalic and atraumatic.  Eyes: No scleral icterus.  Neck: Normal range of motion. No JVD present.  Cardiovascular: Normal rate, regular rhythm, S1 normal, S2 normal and normal heart sounds.   No murmur heard. Pulmonary/Chest: She has decreased breath sounds. She has no wheezes. She has no rhonchi. She has no rales.  Abdominal: Soft. There is no tenderness.  Musculoskeletal: She exhibits no edema.  Neurological: She is  alert and oriented to person, place, and time.  Skin: Skin is warm and dry.  Psychiatric: She has a normal mood and affect.    ASSESSMENT:    1. Coronary artery disease involving native coronary artery of native heart without angina pectoris   2. Paroxysmal supraventricular tachycardia ([place])   3. Essential hypertension   4. Pure hypercholesterolemia   5. Bilateral carotid artery disease (HCC)   6. Other fatigue    PLAN:    In order of problems listed above:  1. CAD - Mod, non-obstructive 1v CAD.  No angina.  Continue ASA, statin.   2. PSVT - No apparent recurrence.  Continue calcium channel blocker.  3. HTN - BP controlled.  Continue current regimen.    4. HL - She is s/w intol of statins.  Recent LDL with PCP was 91.  She is tol Crestor twice a week.  Continue current Rx.  5. Carotid artery disease - Arrange FU dopplers.  Continue ASA, Statin.  6. Fatigue - I have asked her to d/w her PCP.  She may get B12 drawn with him if appropriate.  She also notes symptoms of fatigue after eating (1 hour later) that resolves with eating.  She will d/w her PCP as well.    Medication Adjustments/Labs and Tests Ordered: Current medicines are reviewed at length with the patient today.  Concerns regarding medicines are outlined above.  Medication changes, Labs and Tests ordered today are outlined in the Patient Instructions noted below. Patient Instructions  Medication Instructions:  No changes.  See your medication list.  Labwork: None  Testing/Procedures: 1. Schedule Carotid Ultrasound  Your physician has requested that you have a carotid duplex. This test is an ultrasound of the carotid arteries in your neck. It looks at blood flow through these arteries that supply the brain with blood. Allow one hour for this exam. There are no restrictions or special instructions.   Follow-Up: Dr. Sherren Mocha in  6 months.   Any Other Special Instructions Will Be Listed Below (If  Applicable). Discuss your problem with needed to eat because of dizziness with [name], MD.  If you need a refill on your cardiac medications before your next appointment, please call your pharmacy.   Signed, [name], PA-C  12/02/2015 5:40 PM    [facility] [place], [place], [place]  [address] Phone: [phone]; Fax: [phone]

## 2015-12-02 NOTE — Patient Instructions (Addendum)
Medication Instructions:  No changes.  See your medication list.  Labwork: None  Testing/Procedures: 1. Schedule Carotid Ultrasound  Your physician has requested that you have a carotid duplex. This test is an ultrasound of the carotid arteries in your neck. It looks at blood flow through these arteries that supply the brain with blood. Allow one hour for this exam. There are no restrictions or special instructions.   Follow-Up: Dr. Sherren Mocha in 6 months.   Any Other Special Instructions Will Be Listed Below (If Applicable). Discuss your problem with needed to eat because of dizziness with [name], MD.  If you need a refill on your cardiac medications before your next appointment, please call your pharmacy.

## 2015-12-09 ENCOUNTER — Ambulatory Visit (INDEPENDENT_AMBULATORY_CARE_PROVIDER_SITE_OTHER): Payer: Medicare Other | Admitting: Podiatry

## 2015-12-09 ENCOUNTER — Encounter: Payer: Self-pay | Admitting: Podiatry

## 2015-12-09 DIAGNOSIS — L84 Corns and callosities: Secondary | ICD-10-CM | POA: Diagnosis not present

## 2015-12-09 NOTE — Progress Notes (Signed)
Subjective:     Patient ID: [name], female   DOB: 1934/03/15, 81 y.o.   MRN: [id number]  HPI patient presents stating I have these lesions on my left foot that are still bothersome but improved   Review of Systems     Objective:   Physical Exam Neurovascular status intact with keratotic lesion second and fifth metatarsal left    Assessment:     Lesion secondary to pressure    Plan:     Debrided lesions applied chemical to the second metatarsal and reappoint as needed

## 2015-12-22 ENCOUNTER — Inpatient Hospital Stay (HOSPITAL_COMMUNITY): Admission: RE | Admit: 2015-12-22 | Payer: Medicare Other | Source: Ambulatory Visit

## 2015-12-22 ENCOUNTER — Ambulatory Visit (HOSPITAL_COMMUNITY)
Admission: RE | Admit: 2015-12-22 | Discharge: 2015-12-22 | Disposition: A | Discharge status: Home / Self Care | Payer: Medicare Other | Source: Ambulatory Visit | Attending: Cardiovascular Disease | Admitting: Cardiovascular Disease

## 2015-12-22 DIAGNOSIS — I6523 Occlusion and stenosis of bilateral carotid arteries: Secondary | ICD-10-CM | POA: Diagnosis not present

## 2015-12-22 DIAGNOSIS — I2510 Atherosclerotic heart disease of native coronary artery without angina pectoris: Secondary | ICD-10-CM | POA: Insufficient documentation

## 2015-12-22 DIAGNOSIS — E785 Hyperlipidemia, unspecified: Secondary | ICD-10-CM | POA: Insufficient documentation

## 2015-12-22 DIAGNOSIS — I779 Disorder of arteries and arterioles, unspecified: Secondary | ICD-10-CM

## 2015-12-22 DIAGNOSIS — J449 Chronic obstructive pulmonary disease, unspecified: Secondary | ICD-10-CM | POA: Diagnosis not present

## 2015-12-22 DIAGNOSIS — I10 Essential (primary) hypertension: Secondary | ICD-10-CM | POA: Diagnosis not present

## 2015-12-22 DIAGNOSIS — I739 Peripheral vascular disease, unspecified: Secondary | ICD-10-CM

## 2015-12-22 DIAGNOSIS — R531 Weakness: Secondary | ICD-10-CM | POA: Insufficient documentation

## 2015-12-23 ENCOUNTER — Encounter: Payer: Self-pay | Admitting: Physician Assistant

## 2015-12-23 ENCOUNTER — Telehealth: Payer: Self-pay | Admitting: *Deleted

## 2015-12-23 NOTE — Telephone Encounter (Signed)
Pt notified of Carotid results by phone with verbal understanding.

## 2015-12-24 ENCOUNTER — Ambulatory Visit: Payer: Medicare Other | Admitting: Podiatry

## 2016-01-08 ENCOUNTER — Encounter: Payer: Self-pay | Admitting: Neurology

## 2016-01-08 ENCOUNTER — Ambulatory Visit (INDEPENDENT_AMBULATORY_CARE_PROVIDER_SITE_OTHER): Payer: Medicare Other | Admitting: Neurology

## 2016-01-08 VITALS — BP 130/61 | HR 65 | Ht 63.0 in | Wt 133.8 lb

## 2016-01-08 DIAGNOSIS — G629 Polyneuropathy, unspecified: Secondary | ICD-10-CM | POA: Diagnosis not present

## 2016-01-08 DIAGNOSIS — R202 Paresthesia of skin: Secondary | ICD-10-CM

## 2016-01-08 DIAGNOSIS — M6289 Other specified disorders of muscle: Secondary | ICD-10-CM | POA: Diagnosis not present

## 2016-01-08 DIAGNOSIS — M6281 Muscle weakness (generalized): Secondary | ICD-10-CM

## 2016-01-08 DIAGNOSIS — R2689 Other abnormalities of gait and mobility: Secondary | ICD-10-CM

## 2016-01-08 DIAGNOSIS — R42 Dizziness and giddiness: Secondary | ICD-10-CM | POA: Diagnosis not present

## 2016-01-08 DIAGNOSIS — I6523 Occlusion and stenosis of bilateral carotid arteries: Secondary | ICD-10-CM

## 2016-01-08 DIAGNOSIS — R5382 Chronic fatigue, unspecified: Secondary | ICD-10-CM

## 2016-01-08 DIAGNOSIS — R55 Syncope and collapse: Secondary | ICD-10-CM | POA: Diagnosis not present

## 2016-01-08 DIAGNOSIS — Z9181 History of falling: Secondary | ICD-10-CM | POA: Diagnosis not present

## 2016-01-08 DIAGNOSIS — E538 Deficiency of other specified B group vitamins: Secondary | ICD-10-CM | POA: Diagnosis not present

## 2016-01-08 DIAGNOSIS — D7589 Other specified diseases of blood and blood-forming organs: Secondary | ICD-10-CM | POA: Diagnosis not present

## 2016-01-08 DIAGNOSIS — R269 Unspecified abnormalities of gait and mobility: Secondary | ICD-10-CM

## 2016-01-08 DIAGNOSIS — R413 Other amnesia: Secondary | ICD-10-CM | POA: Diagnosis not present

## 2016-01-08 NOTE — Patient Instructions (Signed)
Remember to drink plenty of fluid, eat healthy meals and do not skip any meals. Try to eat protein with a every meal and eat a healthy snack such as fruit or nuts in between meals. Try to keep a regular sleep-wake schedule and try to exercise daily, particularly in the form of walking, 20-30 minutes a day, if you can.   As far as your medications are concerned, I would like to suggest:  B12 oral 1028mcg daily  As far as diagnostic testing: Labs, emg/ncs, MRI brain, physical therapy  I would like to see you back for emg/ncs, sooner if we need to. Please call us with any interim questions, concerns, problems, updates or refill requests.   Our phone number is [phone]. We also have an after hours call service for urgent matters and there is a physician on-call for urgent questions. For any emergencies you know to call 911 or go to the nearest emergency room

## 2016-01-08 NOTE — Progress Notes (Signed)
[facility]    Provider:  Dr Jaynee Eagles Referring Provider: [name], MD Primary Care Physician:  [name], MD  CC:  Fatigue and weakness  HPI:  [name] is a 81 y.o. female here as a referral from Dr. Dagmar Hait for fatigue and weakness. Past medical history of hypertension, carotid bruit right, degenerative joint disease, fatigue, sarcoidosis, hyperlipidemia, left leg femur broken status post rods, right hip replacement. She has significant fatigue. She feels so tired she has to rest often. Her balance is not good, she sometimes walks but no other exercising and no balance exercises, she has had a few episodes of tingling in the temporal areas sporadic, if she eats then an hour later she feels like she is going to pass out and feels weak, she has no energy. Energy and weakness. No double vision, no ptosis, her eyes feel dry but no ptosis, no swallowing difficulty. Her smell has been lost and her taste is poor. Weakness is described even as just walking, not necessary just with getting out of seats or climbing stairs, she says she gets out of the shower and feels like she is having a "sinking feeling" like she is going to pass out. She is generally feeling bad. Symptoms have been ongoing for years and worsening. No other associated symptoms, no inciting events or trauma at onset, no other modifiable factors, no other focal neurologic deficits. No numbness or tingling in the feet. Denied neck or back pain.  Reviewed notes, labs and imaging from outside physicians, which showed:  B12 283. This was drawn 12/07/2015. CMP showed elevated MCV 104, BUN 24, creatinine 0.8 otherwise unremarkable drawn 11/30/2015.   Patient was seen a Barista. Complaining of strange sensations in her head, like she is thinking, kind of weak, could not describe it. Feels like she is having "sinking spells. Feels that balance, has also been seen by cardiology in October  2017. She was evaluated for nonobstructive one-vessel coronary artery disease, is continuing aspirin and statin PSVT without recurrence on calcium channel blocker, blood pressure control, history of hyperlipidemia intolerant of statins however is using Crestor twice a week, follow-up Dopplers and carotid artery disease were ordered, also discussed fatigue after eating, she follows with pulmonology for history of sarcoidosis. She has arthritis.  Review of Systems: Patient complains of symptoms per HPI as well as the following symptoms: Fatigue, palpitations, shortness of breath, wheezing, constipation, feeling cold, joint swelling, aching muscles, allergies, memory loss, weakness, dizziness, restless legs. Pertinent negatives per HPI. All others negative.   Social History   Social History  . Marital status: Widowed    Spouse name: N/A  . Number of children: 1  . Years of education: N/A   Occupational History  . retired     still works as Oceanographer  .  Retired   Social History Main Topics  . Smoking status: Never Smoker  . Smokeless tobacco: Never Used  . Alcohol use No  . Drug use: No  . Sexual activity: No   Other Topics Concern  . Not on file   Social History Narrative   Lives with daughter and son in law   Caffeine use:  Coffee 1 per day    Family History  Problem Relation Age of Onset  . Colon cancer Mother 52  . Cancer Mother   . Lung cancer Father   . Heart disease Father   . Arrhythmia Father   . Cancer Father   . Esophageal  cancer Neg Hx   . Rectal cancer Neg Hx   . Stomach cancer Neg Hx   . Heart attack Neg Hx   . Stroke Neg Hx     Past Medical History:  Diagnosis Date  . Allergy    SEASONAL  . Arthritis   . CAD (coronary artery disease)    a. Myoview 10/15 - normal EF 70% // Myoview 11/16: EF 75%, normal perfusion, (there was no blood pressure drop at low level exercise with Lexiscan infusion), low risk study // c. LHC 3/17 - LAD irregs, oLCx  70 (neg FFR), mRCA 30, EF 55-65% >> med Rx  . Carotid stenosis    a. Carotid US 9/15 - Bilateral 1-39% ICA >> FU 2 years // b. Bilateral ICA 1-39 >> FU prn  . Esophageal reflux   . Hematochezia   . History of echocardiogram    a. Echo 10/15 - EF 60-65%, no RWMA  . HLD (hyperlipidemia)   . Hx of colonoscopy   . Osteoporosis   . Other chronic pulmonary heart diseases   . Paroxysmal supraventricular tachycardia ([place])   . Sarcoidosis [facility])     Past Surgical History:  Procedure Laterality Date  . arm surgery Right   . BLADDER SURGERY    . CARDIAC CATHETERIZATION N/A 05/20/2015   Procedure: Left Heart Cath and Coronary Angiography;  Surgeon: Sherren Mocha, MD;  Location: [facility];  Service: Cardiovascular;  Laterality: N/A;  . CARPAL TUNNEL RELEASE Left   . COLONOSCOPY    . FOOT SURGERY Right   . HIP SURGERY Right 2011   full replacement  . LEG SURGERY Left May 2014   femur fracture s/p open and closed reduction in [place], Dr. Jimmye Norman  . POLYPECTOMY    . SHOULDER ARTHROSCOPY W/ ROTATOR CUFF REPAIR Right   . TRAPEZIUM RESECTION Right     Current Outpatient Prescriptions  Medication Sig Dispense Refill  . albuterol (PROVENTIL HFA;VENTOLIN HFA) 108 (90 BASE) MCG/ACT inhaler Inhale 2 puffs into the lungs every 4 (four) hours as needed for wheezing or shortness of breath. 1 Inhaler 6  . aspirin EC 81 MG tablet Take 1 tablet (81 mg total) by mouth daily.    . calcium carbonate (TUMS - DOSED IN MG ELEMENTAL CALCIUM) 500 MG chewable tablet Chew 1 tablet by mouth daily. Pt isn't sure of dose.    . diltiazem (CARDIZEM CD) 180 MG 24 hr capsule Take 1 capsule (180 mg total) by mouth daily. 90 capsule 3  . fluticasone (FLONASE) 50 MCG/ACT nasal spray Place 2 sprays into both nostrils daily. 16 g 5  . loratadine (CLARITIN) 10 MG tablet Take 1 tablet (10 mg total) by mouth daily. 30 tablet 11  . meloxicam (MOBIC) 15 MG tablet Take 15 mg by mouth as needed for pain.     Marland Kitchen MYRBETRIQ 50  MG TB24 tablet Take 50 mg by mouth daily.     Marland Kitchen omeprazole (PRILOSEC) 40 MG capsule Take 40 mg by mouth daily.     . rosuvastatin (CRESTOR) 10 MG tablet Take 1 tablet by mouth two times per week only 24 tablet 3  . Vitamin D, Ergocalciferol, (DRISDOL) 50000 UNITS CAPS Take 50,000 Units by mouth every 7 (seven) days.     . nitroGLYCERIN (NITROSTAT) 0.4 MG SL tablet Place 1 tablet (0.4 mg total) under the tongue every 5 (five) minutes as needed for chest pain. (Patient not taking: Reported on 01/08/2016) 25 tablet 3  . traMADol (ULTRAM) 50  MG tablet Take 50 mg by mouth every 6 (six) hours as needed for severe pain.      No current facility-administered medications for this visit.     Allergies as of 01/08/2016 - Review Complete 01/08/2016  Allergen Reaction Noted  . Morphine Nausea Only 08/08/2008  . Oxycodone-acetaminophen  08/08/2008  . Percocet [oxycodone-acetaminophen] Nausea And Vomiting 12/26/2013  . Sulfonamide derivatives  08/08/2008    Vitals: BP 130/61 (BP Location: Right Arm, Patient Position: Sitting, Cuff Size: Normal)   Pulse 65   Ht 5\' 3"  (1.6 m)   Wt 133 lb 12.8 oz (60.7 kg)   BMI 23.70 kg/m  Last Weight:  Wt Readings from Last 1 Encounters:  01/08/16 133 lb 12.8 oz (60.7 kg)   Last Height:   Ht Readings from Last 1 Encounters:  01/08/16 5\' 3"  (1.6 m)    Physical exam: Exam: Gen: NAD, conversant                CV: RRR, no MRG. No Carotid Bruits. No peripheral edema, warm, nontender Eyes: Conjunctivae clear without exudates or hemorrhage  Neuro: Detailed Neurologic Exam  Speech:    Speech is normal; fluent and spontaneous with normal comprehension.  Cognition:    The patient is oriented to person, place, and time;     recent and remote memory intact;     language fluent;     normal attention, concentration,     fund of knowledge Cranial Nerves:    The pupils are equal, round, and reactive to light. The fundi are normal and spontaneous venous pulsations  are present. Visual fields are full to finger confrontation. Extraocular movements are intact. Trigeminal sensation is intact and the muscles of mastication are normal. Left ptosis otherwise the face is symmetric. The palate elevates in the midline. Hearing intact. Voice is normal. Shoulder shrug is normal. The tongue has normal motion without fasciculations.   Coordination:    Normal finger to nose and heel to shin.   Gait:    Good stride, good arm swing, slightly antalgic   Motor Observation: Can get out of her seat without using hands with minimal difficulty, no asymmetry, no atrophy, and no involuntary movements noted.  Tone:    Normal muscle tone.    Posture:    Posture is normal. normal erect    Strength: 3/5 proximal uppers, 4/5 otherwise in the uppers 4-/5 proximal hip flexion Otherwise normal      Sensation: intact to LT     Reflex Exam:  DTR's:    Deep tendon reflexes in the upper and lower extremities are normal bilaterally.   Toes:    The toes are equivocal bilaterally.  (right toe upgoing vs previous surgery?) Clonus:    Clonus is absent.      Assessment/Plan:  81 y.o. female here as a referral from Dr. Dagmar Hait for fatigue and weakness. Past medical history of hypertension, carotid bruit right, degenerative joint disease, fatigue, sarcoidosis, hyperlipidemia, left leg femur broken status post rods, right hip replacement. She has significant fatigue. She complains of generalized weakness, fatigue, imbalance, sinking feelings like she is going to pass out, decreased energy, decreased smell and loss of taste, generally feeling bad.  MRI brain to evaluate for intracranial etiologies of her symptoms such as stroke or other lesions Extensive labs Lab testing will be ordered today including for neuromuscular disorders and peripheral neuropathic disorders Emg/ncs one arm one leg Physical therapy for strength training, gait and balance  [name], MD  [facility]  [facility] [address] [place], [place] [address]  Phone [phone] Fax [phone]

## 2016-01-15 LAB — CK: CK TOTAL: 53 U/L (ref 24–173)

## 2016-01-15 LAB — SJOGREN'S SYNDROME ANTIBODS(SSA + SSB): ENA SSA (RO) Ab: <0.2 AI (ref 0.0–0.9)

## 2016-01-15 LAB — MULTIPLE MYELOMA PANEL, SERUM
ALPHA2 GLOB SERPL ELPH-MCNC: 0.7 g/dL (ref 0.4–1.0)
Albumin SerPl Elph-Mcnc: 4 g/dL (ref 2.9–4.4)
Albumin/Glob SerPl: 1.6 (ref 0.7–1.7)
Alpha 1: 0.3 g/dL (ref 0.0–0.4)
B-Globulin SerPl Elph-Mcnc: 0.9 g/dL (ref 0.7–1.3)
Gamma Glob SerPl Elph-Mcnc: 0.6 g/dL (ref 0.4–1.8)
Globulin, Total: 2.6 g/dL (ref 2.2–3.9)
IGA/IMMUNOGLOBULIN A, SERUM: 118 mg/dL (ref 64–422)
IGG (IMMUNOGLOBIN G), SERUM: 669 mg/dL — AB (ref 700–1600)
IGM (IMMUNOGLOBULIN M), SRM: 120 mg/dL (ref 26–217)
TOTAL PROTEIN: 6.6 g/dL (ref 6.0–8.5)

## 2016-01-15 LAB — ANGIOTENSIN CONVERTING ENZYME: Angio Convert Enzyme: 56 U/L (ref 14–82)

## 2016-01-15 LAB — ANA W/REFLEX: ANA: NEGATIVE

## 2016-01-15 LAB — ACETYLCHOLINE RECEPTOR, BINDING

## 2016-01-15 LAB — B12 AND FOLATE PANEL
Folate: 9.4 ng/mL (ref >3.0)
VITAMIN B 12: 337 pg/mL (ref 211–946)

## 2016-01-15 LAB — METHYLMALONIC ACID, SERUM: METHYLMALONIC ACID: 226 nmol/L (ref 0–378)

## 2016-01-15 LAB — ACETYLCHOLINE RECEPTOR, MODULATING

## 2016-01-15 LAB — RHEUMATOID FACTOR

## 2016-01-15 LAB — HEAVY METALS, BLOOD
Arsenic: 7 ug/L (ref 2–23)
LEAD, BLOOD: NOT DETECTED ug/dL (ref 0–19)
Mercury: NOT DETECTED ug/L (ref 0.0–14.9)

## 2016-01-15 LAB — VITAMIN B1: Thiamine: 165.2 nmol/L (ref 66.5–200.0)

## 2016-01-15 LAB — ACETYLCHOLINE RECEPTOR, BLOCKING: ACETYLCHOL BLOCK AB: 11 % (ref 0–25)

## 2016-01-15 LAB — SEDIMENTATION RATE: Sed Rate: 8 mm/hr (ref 0–40)

## 2016-01-15 LAB — TSH: TSH: 1.62 u[IU]/mL (ref 0.450–4.500)

## 2016-01-18 ENCOUNTER — Telehealth: Payer: Self-pay | Admitting: *Deleted

## 2016-01-18 NOTE — Telephone Encounter (Signed)
Called and spoke to pt about lab results per AA,MD. She is taking OTC B12 recommended by AA,MD. Advised this was still ok.   Gave her number to [facility] to call and schedule MRI. Also gave number to neuro rehab to call and schedule physical therapy. She verbalized understanding.

## 2016-01-18 NOTE — Telephone Encounter (Signed)
-----   Message from Melvenia Beam, MD sent at 01/15/2016  9:16 PM EST ----- All labs are normal. B12 is 337 and methylmalonic acid (secondary confirmatory test) is also normal indicating there is no B12 deficiency. Thanks

## 2016-01-27 ENCOUNTER — Ambulatory Visit
Admission: RE | Admit: 2016-01-27 | Discharge: 2016-01-27 | Disposition: A | Discharge status: Home / Self Care | Payer: Medicare Other | Source: Ambulatory Visit | Attending: Neurology | Admitting: Neurology

## 2016-01-27 DIAGNOSIS — E538 Deficiency of other specified B group vitamins: Secondary | ICD-10-CM

## 2016-01-27 DIAGNOSIS — R202 Paresthesia of skin: Secondary | ICD-10-CM

## 2016-01-27 DIAGNOSIS — M6289 Other specified disorders of muscle: Secondary | ICD-10-CM

## 2016-01-27 DIAGNOSIS — D7589 Other specified diseases of blood and blood-forming organs: Secondary | ICD-10-CM

## 2016-01-27 DIAGNOSIS — R413 Other amnesia: Secondary | ICD-10-CM

## 2016-01-27 DIAGNOSIS — R5382 Chronic fatigue, unspecified: Secondary | ICD-10-CM

## 2016-01-27 DIAGNOSIS — R55 Syncope and collapse: Secondary | ICD-10-CM

## 2016-01-27 DIAGNOSIS — R42 Dizziness and giddiness: Secondary | ICD-10-CM

## 2016-01-27 DIAGNOSIS — M6281 Muscle weakness (generalized): Secondary | ICD-10-CM

## 2016-01-27 DIAGNOSIS — R2689 Other abnormalities of gait and mobility: Secondary | ICD-10-CM

## 2016-01-29 ENCOUNTER — Telehealth: Payer: Self-pay | Admitting: *Deleted

## 2016-01-29 NOTE — Telephone Encounter (Signed)
-----   Message from Melvenia Beam, MD sent at 01/28/2016  6:09 PM EST ----- MRI of the brain is unremarkable for age, no cause for her symptoms seen. Lovey Newcomer, would you call this back Friday morning please since Terrence Dupont is not here? thanks

## 2016-01-29 NOTE — Telephone Encounter (Signed)
I spoke to pt and relayed that her MRI unremarkable for her age. No cause for her symptoms seen.  She verbalized understanding.

## 2016-02-02 ENCOUNTER — Ambulatory Visit: Payer: Medicare Other | Attending: Neurology | Admitting: Physical Therapy

## 2016-02-02 DIAGNOSIS — R293 Abnormal posture: Secondary | ICD-10-CM | POA: Insufficient documentation

## 2016-02-02 DIAGNOSIS — M6281 Muscle weakness (generalized): Secondary | ICD-10-CM | POA: Diagnosis not present

## 2016-02-02 DIAGNOSIS — R2681 Unsteadiness on feet: Secondary | ICD-10-CM | POA: Diagnosis present

## 2016-02-02 NOTE — Therapy (Signed)
[facility] [address] [place], [place], [address] Phone: [phone]   Fax:  [phone]  Physical Therapy Evaluation  Patient Details  Name: [name] MRN: [id number] Date of Birth: 1934-08-22 Referring Provider: Dr. [name]  Encounter Date: 02/02/2016      PT End of Session - 02/02/16 1450    Visit Number 1   Number of Visits 16   Date for PT Re-Evaluation 03/29/16   Authorization Type Medicare- G Code/Progress Note every 10th visit   PT Start Time 1400   PT Stop Time 1441   PT Time Calculation (min) 41 min   Equipment Utilized During Treatment Gait belt   Activity Tolerance Patient tolerated treatment well   Behavior During Therapy [facility] for tasks assessed/performed      Past Medical History:  Diagnosis Date  . Allergy    SEASONAL  . Arthritis   . CAD (coronary artery disease)    a. Myoview 10/15 - normal EF 70% // Myoview 11/16: EF 75%, normal perfusion, (there was no blood pressure drop at low level exercise with Lexiscan infusion), low risk study // c. LHC 3/17 - LAD irregs, oLCx 70 (neg FFR), mRCA 30, EF 55-65% >> med Rx  . Carotid stenosis    a. Carotid US 9/15 - Bilateral 1-39% ICA >> FU 2 years // b. Bilateral ICA 1-39 >> FU prn  . Esophageal reflux   . Hematochezia   . History of echocardiogram    a. Echo 10/15 - EF 60-65%, no RWMA  . HLD (hyperlipidemia)   . Hx of colonoscopy   . Osteoporosis   . Other chronic pulmonary heart diseases   . Paroxysmal supraventricular tachycardia ([place])   . Sarcoidosis [facility])     Past Surgical History:  Procedure Laterality Date  . arm surgery Right   . BLADDER SURGERY    . CARDIAC CATHETERIZATION N/A 05/20/2015   Procedure: Left Heart Cath and Coronary Angiography;  Surgeon: Sherren Mocha, MD;  Location: [place] [facility];  Service: Cardiovascular;  Laterality: N/A;  . CARPAL TUNNEL RELEASE Left   . COLONOSCOPY    . FOOT SURGERY Right   .  HIP SURGERY Right 2011   full replacement  . LEG SURGERY Left May 2014   femur fracture s/p open and closed reduction in [place], Dr. Jimmye Norman  . POLYPECTOMY    . SHOULDER ARTHROSCOPY W/ ROTATOR CUFF REPAIR Right   . TRAPEZIUM RESECTION Right     There were no vitals filed for this visit.       Subjective Assessment - 02/02/16 1402    Subjective Pt is a 81 y/o female who presents to OPPT with c/o decreased strength and fatigue.  Pt initially went to neurologist for "weird sensations" in head, which she reports they do not know cause at this time.  Pt reports symptoms began ~ 6-12 months ago and notes some changes in balance and strength.   Pertinent History sarcoidosis, R THA, L hip fx s/p ORIF, osteoporosis, CAD   Limitations House hold activities;Walking   Patient Stated Goals improve strength   Currently in Pain? No/denies            [facility] PT Assessment - 02/02/16 1405      Assessment   Medical Diagnosis decreased strength   Referring Provider Dr. [name]   Onset Date/Surgical Date --  6-12 months   Next MD Visit 01/01/17   Prior Therapy following orthopedic surgeries  Precautions   Precautions Fall   Precaution Comments has built in shoe lift on L     Restrictions   Weight Bearing Restrictions No     Balance Screen   Has the patient fallen in the past 6 months No   Has the patient had a decrease in activity level because of a fear of falling?  No   Is the patient reluctant to leave their home because of a fear of falling?  No     Home Environment   Living Environment Private residence   Living Arrangements Children  daughter and son-in-law   Type of [place] to enter   Entrance Stairs-Number of Steps 1   [place] Two level;Able to live on main level with bedroom/bathroom     Prior Function   Level of Independence Independent   Vocation Retired   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    Leisure "I try to walk"     Cognition   Overall Cognitive Status Within Functional Limits for tasks assessed     Observation/Other Assessments   Focus on Therapeutic Outcomes (FOTO)  45 (55% limited; predicted 41% limited)   Activities of Balance Confidence Scale (ABC Scale)  34.4% confident     Posture/Postural Control   Posture/Postural Control Postural limitations   Postural Limitations Rounded Shoulders;Forward head;Increased thoracic kyphosis     Strength   Overall Strength Comments tested in sitting; suspect hip ext and abdct weakness due to gait abnormalities and difficulty rising without UE support from chair   Strength Assessment Site Hip;Knee;Ankle   Right Hip Flexion 3/5   Left Hip Flexion 3/5   Right/Left Knee Right;Left   Right Knee Flexion 4+/5   Right Knee Extension 4/5   Left Knee Flexion 4+/5   Left Knee Extension 4/5   Right Ankle Dorsiflexion 3/5   Left Ankle Dorsiflexion 3/5     Ambulation/Gait   Ambulation/Gait Yes   Ambulation/Gait Assistance 7: Independent   Ambulation Distance (Feet) 75 Feet   Assistive device None   Gait Pattern Trendelenburg   Ambulation Surface Level;Indoor   Gait velocity 3.2 ft/sec  10.25 sec     Standardized Balance Assessment   Standardized Balance Assessment Berg Balance Test;Timed Up and Go Test     Berg Balance Test   Sit to Stand Able to stand without using hands and stabilize independently   Standing Unsupported Able to stand safely 2 minutes   Sitting with Back Unsupported but Feet Supported on Floor or Stool Able to sit safely and securely 2 minutes   Stand to Sit Sits safely with minimal use of hands   Transfers Able to transfer safely, minor use of hands   Standing Unsupported with Eyes Closed Able to stand 10 seconds safely   Standing Ubsupported with Feet Together Able to place feet together independently and stand for 1 minute with supervision   From Standing, Reach Forward with Outstretched Arm Can reach  forward >12 cm safely (5")   From Standing Position, Pick up Object from Floor Able to pick up shoe, needs supervision   From Standing Position, Turn to Look Behind Over each Shoulder Turn sideways only but maintains balance   Turn 360 Degrees Able to turn 360 degrees safely but slowly   Standing Unsupported, Alternately Place Feet on Step/Stool Able to complete 4 steps without aid or supervision   Standing Unsupported, One Foot in Front Able to plae foot ahead of the  other independently and hold 30 seconds   Standing on One Leg Tries to lift leg/unable to hold 3 seconds but remains standing independently   Total Score 43     Timed Up and Go Test   Normal TUG (seconds) 16.25                           PT Education - 02/02/16 1449    Education provided Yes   Education Details clinical findings, POC, goals of care   Person(s) Educated Patient   Methods Explanation   Comprehension Verbalized understanding          PT Short Term Goals - 02/02/16 1455      PT SHORT TERM GOAL #1   Title verbalize understanding of fall prevention strategies (Target Date for all STGs: 03/01/16)   Time 4   Period Weeks   Status New     PT SHORT TERM GOAL #2   Title improve BERG balance score to >/= 46/56 for improved balance and decreased fall risk    Status New     PT SHORT TERM GOAL #3   Title amb > 300' on indoor/paved outdoor surfaces independently for improved community access            PT Long Term Goals - 02/02/16 1458      PT LONG TERM GOAL #1   Title independent with HEP (Target Date for all LTGs: 03/29/16)   Time 8   Period Weeks   Status New     PT LONG TERM GOAL #2   Title improve BERG balance score to >/= 49/56 for improved balance   Status New     PT LONG TERM GOAL #3   Title improve timed up and go to < 13 sec for improved mobility    Status New     PT LONG TERM GOAL #4   Title amb > 500' on various indoor/outdoor surfaces independently for improved  function   Status New               Plan - 02/02/16 1450    Clinical Impression Statement Pt is a 81 y/o female who presents to OPPT for moderate complexity PT eval for decreased strength and balance.  Pt demonstrates gait abnormalities, decreased strength and decreased balance affecting safe, functional mobility.  Pt will benefit from PT to address deficits.   Rehab Potential Good   Clinical Impairments Affecting Rehab Potential co-morbidities   PT Frequency 2x / week   PT Duration 8 weeks   PT Treatment/Interventions ADLs/Self Care Home Management;Cryotherapy;Moist Heat;Neuromuscular re-education;Therapeutic exercise;Balance training;Therapeutic activities;Functional mobility training;Stair training;Gait training;Patient/family education;Vestibular;Energy conservation   PT Next Visit Plan establish HEP for strengthening and balance    Recommended Other Services OT referral-request sent   Consulted and Agree with Plan of Care Patient      Patient will benefit from skilled therapeutic intervention in order to improve the following deficits and impairments:  Abnormal gait, Decreased activity tolerance, Decreased balance, Postural dysfunction, Decreased strength, Decreased mobility  Visit Diagnosis: Muscle weakness (generalized) - Plan: PT plan of care cert/re-cert  Abnormal posture - Plan: PT plan of care cert/re-cert  Unsteadiness on feet - Plan: PT plan of care cert/re-cert      G-Codes - [id number]    Functional Assessment Tool Used BERG 43/56   Functional Limitation Mobility: Walking and moving around   Mobility: Walking and Moving Around Current Status [id number]) At least 20  percent but less than 40 percent impaired, limited or restricted   Mobility: Walking and Moving Around Goal Status [phone]) At least 1 percent but less than 20 percent impaired, limited or restricted       Problem List Patient Active Problem List   Diagnosis Date Noted  . Allergic rhinitis  09/15/2015  . CAD (coronary artery disease), native coronary artery 05/20/2015  . Coronary artery disease involving native coronary artery of native heart with angina pectoris ([place])   . CAD (coronary artery disease) 05/18/2015  . Essential hypertension 05/18/2015  . Carotid stenosis 05/18/2015  . Cough 03/19/2015  . Left foot pain 09/02/2014  . DOE (dyspnea on exertion) 12/26/2013  . Abnormality of gait 11/06/2013  . Scoliosis (and kyphoscoliosis), idiopathic 07/30/2013  . 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 Constipation 09/05/2012  . History of sinus tachycardia 09/05/2012  . Closed left subtrochanteric femur fracture S/P Open/closed and reduction, internal medullary fixation  09/05/2012  . Acute posthemorrhagic anemia 08/22/2012  . Lumbar radiculopathy 11/23/2011  . Leg weakness 11/23/2011  . Mitral regurgitation 11/22/2011  . DIARRHEA-PRESUMED INFECTIOUS 08/18/2008  . RECTAL BLEEDING 08/18/2008  . PERSONAL HX COLONIC POLYPS 08/18/2008  . DEGENERATIVE JOINT DISEASE 08/14/2008  . SKIN CANCER, HX OF 08/14/2008  . CARPAL TUNNEL SYNDROME, HX OF 08/14/2008  . SARCOIDOSIS, PULMONARY 08/08/2008  . HLD (hyperlipidemia) 08/08/2008  . Paroxysmal supraventricular tachycardia ([place]) 08/08/2008  . GERD 08/08/2008       Laureen Abrahams, PT, DPT 02/02/16 3:05 PM    [facility] [address] [place], [place], [address] Phone: [phone]   Fax:  [phone]  Name: [name] MRN: [id number] Date of Birth: 1934-10-29

## 2016-02-03 ENCOUNTER — Encounter: Payer: Self-pay | Admitting: Podiatry

## 2016-02-03 ENCOUNTER — Ambulatory Visit (INDEPENDENT_AMBULATORY_CARE_PROVIDER_SITE_OTHER): Payer: Medicare Other | Admitting: Podiatry

## 2016-02-03 DIAGNOSIS — M79605 Pain in left leg: Secondary | ICD-10-CM

## 2016-02-03 DIAGNOSIS — B351 Tinea unguium: Secondary | ICD-10-CM

## 2016-02-03 DIAGNOSIS — M79676 Pain in unspecified toe(s): Secondary | ICD-10-CM

## 2016-02-03 DIAGNOSIS — L84 Corns and callosities: Secondary | ICD-10-CM | POA: Diagnosis not present

## 2016-02-03 DIAGNOSIS — M79604 Pain in right leg: Secondary | ICD-10-CM

## 2016-02-03 NOTE — Progress Notes (Signed)
Subjective:     Patient ID: [name], female   DOB: 03/24/1934, 81 y.o.   MRN: [id number]  HPI patient presents stating lesion seems better on my left foot and I do have this very thick nail third left that I cannot cut that becomes painful   Review of Systems     Objective:   Physical Exam Neurovascular status intact with thick mycotic nail disease third left with pain and also noted to have lesion formation 2 left    Assessment:     Mycotic nail infection with pain and lesion formation    Plan:     Discussed condition and debrided the lesion and debrided the nailbed with sharp instrumentation and smooth that and reappoint in approximately 4 months

## 2016-02-04 ENCOUNTER — Ambulatory Visit: Payer: Medicare Other | Admitting: Physical Therapy

## 2016-02-04 DIAGNOSIS — R293 Abnormal posture: Secondary | ICD-10-CM

## 2016-02-04 DIAGNOSIS — M6281 Muscle weakness (generalized): Secondary | ICD-10-CM | POA: Diagnosis not present

## 2016-02-04 DIAGNOSIS — R2681 Unsteadiness on feet: Secondary | ICD-10-CM

## 2016-02-04 NOTE — Therapy (Signed)
[facility] [address] [place], [place], [address] Phone: [phone]   Fax:  [phone]  Physical Therapy Treatment  Patient Details  Name: [name] MRN: [id number] Date of Birth: 1934/10/06 Referring Provider: Dr. [name]  Encounter Date: 02/04/2016      PT End of Session - 02/04/16 1045    Visit Number 2   Number of Visits 16   Date for PT Re-Evaluation 03/29/16   Authorization Type Medicare- G Code/Progress Note every 10th visit   PT Start Time 1013   PT Stop Time 1053   PT Time Calculation (min) 40 min   Activity Tolerance Patient tolerated treatment well   Behavior During Therapy [facility] for tasks assessed/performed      Past Medical History:  Diagnosis Date  . Allergy    SEASONAL  . Arthritis   . CAD (coronary artery disease)    a. Myoview 10/15 - normal EF 70% // Myoview 11/16: EF 75%, normal perfusion, (there was no blood pressure drop at low level exercise with Lexiscan infusion), low risk study // c. LHC 3/17 - LAD irregs, oLCx 70 (neg FFR), mRCA 30, EF 55-65% >> med Rx  . Carotid stenosis    a. Carotid US 9/15 - Bilateral 1-39% ICA >> FU 2 years // b. Bilateral ICA 1-39 >> FU prn  . Esophageal reflux   . Hematochezia   . History of echocardiogram    a. Echo 10/15 - EF 60-65%, no RWMA  . HLD (hyperlipidemia)   . Hx of colonoscopy   . Osteoporosis   . Other chronic pulmonary heart diseases   . Paroxysmal supraventricular tachycardia ([place])   . Sarcoidosis [facility])     Past Surgical History:  Procedure Laterality Date  . arm surgery Right   . BLADDER SURGERY    . CARDIAC CATHETERIZATION N/A 05/20/2015   Procedure: Left Heart Cath and Coronary Angiography;  Surgeon: Sherren Mocha, MD;  Location: [place] [facility];  Service: Cardiovascular;  Laterality: N/A;  . CARPAL TUNNEL RELEASE Left   . COLONOSCOPY    . FOOT SURGERY Right   . HIP SURGERY Right 2011   full replacement  . LEG  SURGERY Left May 2014   femur fracture s/p open and closed reduction in [place], Dr. Jimmye Norman  . POLYPECTOMY    . SHOULDER ARTHROSCOPY W/ ROTATOR CUFF REPAIR Right   . TRAPEZIUM RESECTION Right     There were no vitals filed for this visit.      Subjective Assessment - 02/04/16 1013    Subjective got PNA shot yesterday and arm is very sore.  otherwise doing well without any complaints.   Currently in Pain? No/denies                         [facility] Adult PT Treatment/Exercise - 02/04/16 1021      Exercises   Exercises Knee/Hip     Knee/Hip Exercises: Aerobic   Nustep L5x 8 min     Knee/Hip Exercises: Seated   Sit to Sand 15 reps;without UE support     Knee/Hip Exercises: Supine   Bridges 15 reps   Bridges with Clamshell Both;15 reps  red theraband     Knee/Hip Exercises: Sidelying   Clams x15 bil with red theraband     Knee/Hip Exercises: Prone   Straight Leg Raises Both;15 reps  PT Education - 02/04/16 1045    Education provided Yes   Education Details HEP   Person(s) Educated Patient   Methods Explanation;Demonstration;Handout   Comprehension Verbalized understanding;Returned demonstration;Need further instruction          PT Short Term Goals - 02/02/16 1455      PT SHORT TERM GOAL #1   Title verbalize understanding of fall prevention strategies (Target Date for all STGs: 03/01/16)   Time 4   Period Weeks   Status New     PT SHORT TERM GOAL #2   Title improve BERG balance score to >/= 46/56 for improved balance and decreased fall risk    Status New     PT SHORT TERM GOAL #3   Title amb > 300' on indoor/paved outdoor surfaces independently for improved community access            PT Long Term Goals - 02/02/16 1458      PT LONG TERM GOAL #1   Title independent with HEP (Target Date for all LTGs: 03/29/16)   Time 8   Period Weeks   Status New     PT LONG TERM GOAL #2   Title improve BERG balance score to  >/= 49/56 for improved balance   Status New     PT LONG TERM GOAL #3   Title improve timed up and go to < 13 sec for improved mobility    Status New     PT LONG TERM GOAL #4   Title amb > 500' on various indoor/outdoor surfaces independently for improved function   Status New               Plan - 02/04/16 1046    Clinical Impression Statement Initiated HEP for strengthening which pt reports muscle fatigue after exercises.  No goals met as only 2nd visit, and will continue to benefit from PT to maximize function.   PT Treatment/Interventions ADLs/Self Care Home Management;Cryotherapy;Moist Heat;Neuromuscular re-education;Therapeutic exercise;Balance training;Therapeutic activities;Functional mobility training;Stair training;Gait training;Patient/family education;Vestibular;Energy conservation   PT Next Visit Plan review strengthening HEP, add balance exerciese   Consulted and Agree with Plan of Care Patient      Patient will benefit from skilled therapeutic intervention in order to improve the following deficits and impairments:  Abnormal gait, Decreased activity tolerance, Decreased balance, Postural dysfunction, Decreased strength, Decreased mobility  Visit Diagnosis: Muscle weakness (generalized)  Abnormal posture  Unsteadiness on feet     Problem List Patient Active Problem List   Diagnosis Date Noted  . Allergic rhinitis 09/15/2015  . CAD (coronary artery disease), native coronary artery 05/20/2015  . Coronary artery disease involving native coronary artery of native heart with angina pectoris ([place])   . CAD (coronary artery disease) 05/18/2015  . Essential hypertension 05/18/2015  . Carotid stenosis 05/18/2015  . Cough 03/19/2015  . Left foot pain 09/02/2014  . DOE (dyspnea on exertion) 12/26/2013  . Abnormality of gait 11/06/2013  . Scoliosis (and kyphoscoliosis), idiopathic 07/30/2013  . 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 Constipation  09/05/2012  . History of sinus tachycardia 09/05/2012  . Closed left subtrochanteric femur fracture S/P Open/closed and reduction, internal medullary fixation  09/05/2012  . Acute posthemorrhagic anemia 08/22/2012  . Lumbar radiculopathy 11/23/2011  . Leg weakness 11/23/2011  . Mitral regurgitation 11/22/2011  . DIARRHEA-PRESUMED INFECTIOUS 08/18/2008  . RECTAL BLEEDING 08/18/2008  . PERSONAL HX COLONIC POLYPS 08/18/2008  . DEGENERATIVE JOINT DISEASE 08/14/2008  . SKIN CANCER, HX OF 08/14/2008  .  CARPAL TUNNEL SYNDROME, HX OF 08/14/2008  . SARCOIDOSIS, PULMONARY 08/08/2008  . HLD (hyperlipidemia) 08/08/2008  . Paroxysmal supraventricular tachycardia ([place]) 08/08/2008  . GERD 08/08/2008      Laureen Abrahams, PT, DPT 02/04/16 10:53 AM    [facility] [address] [place], [place], [address] Phone: [phone]   Fax:  [phone]  Name: [name] MRN: [id number] Date of Birth: [birth year]

## 2016-02-04 NOTE — Patient Instructions (Signed)
Bridging    Slowly raise buttocks from floor, and hold for 5 seconds. Repeat __15__ times per set. Do _1_ sets per session. Do __1-2__ sessions per day.  http://orth.exer.us/1097   Copyright  VHI. All rights reserved.   Abductor Strength: Bridge Pose (Strap)    Put band around thighs. Lift hips and push thighs out against band.  Repeat __15__ times.  Copyright  VHI. All rights reserved.   Abduction: Clam (Eccentric) - Side-Lying    Lie on side with knees bent and band around thighs. Lift top knee, keeping feet together. Keep trunk steady. Slowly lower. __15_ reps per set, _1-2__ sets per day.   http://ecce.exer.us/65   Copyright  VHI. All rights reserved.    EXTENSION: Prone - Knee Extended (Active)    Lie on stomach, legs straight. Lift right leg toward ceiling.  Repeat with other leg.  Complete _1__ sets of _15__ repetitions. Perform _1-2__ sessions per day.  http://gtsc.exer.us/71   Copyright  VHI. All rights reserved.     Functional Quadriceps: Sit to Stand    Sit on edge of chair, feet flat on floor. Stand upright, extending knees fully. Repeat __15__ times per set. Do _1___ sets per session. Do __1-2__ sessions per day.  http://orth.exer.us/735   Copyright  VHI. All rights reserved.

## 2016-02-09 ENCOUNTER — Ambulatory Visit: Payer: Medicare Other | Admitting: Physical Therapy

## 2016-02-09 DIAGNOSIS — R293 Abnormal posture: Secondary | ICD-10-CM

## 2016-02-09 DIAGNOSIS — M6281 Muscle weakness (generalized): Secondary | ICD-10-CM

## 2016-02-09 DIAGNOSIS — R2681 Unsteadiness on feet: Secondary | ICD-10-CM

## 2016-02-09 NOTE — Therapy (Signed)
[facility] [address] [place], [place], [address] Phone: [phone]   Fax:  [phone]  Physical Therapy Treatment  Patient Details  Name: [name] MRN: [id number] Date of Birth: Jan 14, 1935 Referring Provider: Dr. [name]  Encounter Date: 02/09/2016      PT End of Session - 02/09/16 0917    Visit Number 3   Number of Visits 16   Date for PT Re-Evaluation 03/29/16   Authorization Type Medicare- G Code/Progress Note every 10th visit   PT Start Time 0843   PT Stop Time 0923   PT Time Calculation (min) 40 min   Activity Tolerance Patient tolerated treatment well   Behavior During Therapy [facility] for tasks assessed/performed      Past Medical History:  Diagnosis Date  . Allergy    SEASONAL  . Arthritis   . CAD (coronary artery disease)    a. Myoview 10/15 - normal EF 70% // Myoview 11/16: EF 75%, normal perfusion, (there was no blood pressure drop at low level exercise with Lexiscan infusion), low risk study // c. LHC 3/17 - LAD irregs, oLCx 70 (neg FFR), mRCA 30, EF 55-65% >> med Rx  . Carotid stenosis    a. Carotid US 9/15 - Bilateral 1-39% ICA >> FU 2 years // b. Bilateral ICA 1-39 >> FU prn  . Esophageal reflux   . Hematochezia   . History of echocardiogram    a. Echo 10/15 - EF 60-65%, no RWMA  . HLD (hyperlipidemia)   . Hx of colonoscopy   . Osteoporosis   . Other chronic pulmonary heart diseases   . Paroxysmal supraventricular tachycardia ([place])   . Sarcoidosis [facility])     Past Surgical History:  Procedure Laterality Date  . arm surgery Right   . BLADDER SURGERY    . CARDIAC CATHETERIZATION N/A 05/20/2015   Procedure: Left Heart Cath and Coronary Angiography;  Surgeon: Sherren Mocha, MD;  Location: [place] [facility];  Service: Cardiovascular;  Laterality: N/A;  . CARPAL TUNNEL RELEASE Left   . COLONOSCOPY    . FOOT SURGERY Right   . HIP SURGERY Right 2011   full replacement  . LEG  SURGERY Left May 2014   femur fracture s/p open and closed reduction in [place], Dr. Jimmye Norman  . POLYPECTOMY    . SHOULDER ARTHROSCOPY W/ ROTATOR CUFF REPAIR Right   . TRAPEZIUM RESECTION Right     There were no vitals filed for this visit.      Subjective Assessment - 02/09/16 0845    Subjective very tired after last session; has done exercises "some" but "not that much."   Pertinent History sarcoidosis, R THA, L hip fx s/p ORIF, osteoporosis, CAD   Patient Stated Goals improve strength   Currently in Pain? No/denies                         [facility] Adult PT Treatment/Exercise - 02/09/16 0856      Knee/Hip Exercises: Aerobic   Nustep L4x10 min     Knee/Hip Exercises: Seated   Sit to Sand 15 reps;without UE support     Knee/Hip Exercises: Supine   Bridges 15 reps   Bridges with Clamshell Both;15 reps  red theraband     Knee/Hip Exercises: Sidelying   Clams x15 bil with red theraband     Knee/Hip Exercises: Prone   Straight Leg Raises Both;15 reps  PT Short Term Goals - 02/02/16 1455      PT SHORT TERM GOAL #1   Title verbalize understanding of fall prevention strategies (Target Date for all STGs: 03/01/16)   Time 4   Period Weeks   Status New     PT SHORT TERM GOAL #2   Title improve BERG balance score to >/= 46/56 for improved balance and decreased fall risk    Status New     PT SHORT TERM GOAL #3   Title amb > 300' on indoor/paved outdoor surfaces independently for improved community access            PT Long Term Goals - 02/02/16 1458      PT LONG TERM GOAL #1   Title independent with HEP (Target Date for all LTGs: 03/29/16)   Time 8   Period Weeks   Status New     PT LONG TERM GOAL #2   Title improve BERG balance score to >/= 49/56 for improved balance   Status New     PT LONG TERM GOAL #3   Title improve timed up and go to < 13 sec for improved mobility    Status New     PT LONG TERM GOAL #4   Title  amb > 500' on various indoor/outdoor surfaces independently for improved function   Status New               Plan - 02/09/16 0919    Clinical Impression Statement Pt reports limited compliance with HEP so no new exercises given today with instructions to incorporate exercises into daily routine to ensure compliance.  Will plan to add balance exercises next visit.     PT Treatment/Interventions ADLs/Self Care Home Management;Cryotherapy;Moist Heat;Neuromuscular re-education;Therapeutic exercise;Balance training;Therapeutic activities;Functional mobility training;Stair training;Gait training;Patient/family education;Vestibular;Energy conservation   PT Next Visit Plan add balance exerciese; continue strengthening   Consulted and Agree with Plan of Care Patient      Patient will benefit from skilled therapeutic intervention in order to improve the following deficits and impairments:  Abnormal gait, Decreased activity tolerance, Decreased balance, Postural dysfunction, Decreased strength, Decreased mobility  Visit Diagnosis: Muscle weakness (generalized)  Abnormal posture  Unsteadiness on feet     Problem List Patient Active Problem List   Diagnosis Date Noted  . Allergic rhinitis 09/15/2015  . CAD (coronary artery disease), native coronary artery 05/20/2015  . Coronary artery disease involving native coronary artery of native heart with angina pectoris ([place])   . CAD (coronary artery disease) 05/18/2015  . Essential hypertension 05/18/2015  . Carotid stenosis 05/18/2015  . Cough 03/19/2015  . Left foot pain 09/02/2014  . DOE (dyspnea on exertion) 12/26/2013  . Abnormality of gait 11/06/2013  . Scoliosis (and kyphoscoliosis), idiopathic 07/30/2013  . 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 Constipation 09/05/2012  . History of sinus tachycardia 09/05/2012  . Closed left subtrochanteric femur fracture S/P Open/closed and reduction, internal medullary fixation   09/05/2012  . Acute posthemorrhagic anemia 08/22/2012  . Lumbar radiculopathy 11/23/2011  . Leg weakness 11/23/2011  . Mitral regurgitation 11/22/2011  . DIARRHEA-PRESUMED INFECTIOUS 08/18/2008  . RECTAL BLEEDING 08/18/2008  . PERSONAL HX COLONIC POLYPS 08/18/2008  . DEGENERATIVE JOINT DISEASE 08/14/2008  . SKIN CANCER, HX OF 08/14/2008  . CARPAL TUNNEL SYNDROME, HX OF 08/14/2008  . SARCOIDOSIS, PULMONARY 08/08/2008  . HLD (hyperlipidemia) 08/08/2008  . Paroxysmal supraventricular tachycardia ([place]) 08/08/2008  . GERD 08/08/2008       Laureen Abrahams,  PT, DPT 02/09/16 9:24 AM    [facility] [address] [place], [place], [address] Phone: [phone]   Fax:  [phone]  Name: [name] MRN: [id number] Date of Birth: [birth year]

## 2016-02-11 ENCOUNTER — Ambulatory Visit: Payer: Medicare Other | Admitting: Physical Therapy

## 2016-02-16 ENCOUNTER — Ambulatory Visit: Payer: Medicare Other | Admitting: Physical Therapy

## 2016-02-17 ENCOUNTER — Ambulatory Visit: Payer: Medicare Other | Admitting: Physical Therapy

## 2016-02-23 ENCOUNTER — Ambulatory Visit: Payer: Medicare Other | Attending: Neurology | Admitting: Physical Therapy

## 2016-02-23 DIAGNOSIS — R2681 Unsteadiness on feet: Secondary | ICD-10-CM | POA: Insufficient documentation

## 2016-02-23 DIAGNOSIS — R293 Abnormal posture: Secondary | ICD-10-CM | POA: Diagnosis present

## 2016-02-23 DIAGNOSIS — M6281 Muscle weakness (generalized): Secondary | ICD-10-CM | POA: Diagnosis not present

## 2016-02-23 NOTE — Patient Instructions (Signed)
Feet Together (Compliant Surface) Varied Arm Positions - Eyes Closed    Stand on compliant surface: __pillow or cushion___ with feet together and arms at your side. Close eyes and stand still. Hold__10-15__ seconds. Repeat __5__ times per session. Do _1-2___ sessions per day.  Copyright  VHI. All rights reserved.   Feet Together (Compliant Surface) Head Motion - Eyes Open    With eyes open, standing on compliant surface: ___pillow or cushion_____, feet together, move head slowly: up and down 10 times and side to side 10 times. Repeat __1-2_ times per session. Do __1-2__ sessions per day.  Copyright  VHI. All rights reserved.   Feet Apart (Compliant Surface) Head Motion - Eyes Closed    Stand on compliant surface: _pillow or cushion__ with feet shoulder width apart. Close eyes and move head slowly, up and down 10 times and side to side 10 times. Repeat __1-2__ times per session. Do _1-2___ sessions per day.  Copyright  VHI. All rights reserved.

## 2016-02-23 NOTE — Therapy (Signed)
[facility] [address] [place], [place], [address] Phone: [phone]   Fax:  [phone]  Physical Therapy Treatment  Patient Details  Name: [name] MRN: [id number] Date of Birth: 07-26-1934 Referring Provider: Dr. [name]  Encounter Date: 02/23/2016      PT End of Session - 02/23/16 0923    Visit Number 4   Number of Visits 16   Date for PT Re-Evaluation 03/29/16   Authorization Type Medicare- G Code/Progress Note every 10th visit   PT Start Time 0846   PT Stop Time 0926   PT Time Calculation (min) 40 min   Equipment Utilized During Treatment Gait belt   Activity Tolerance Patient tolerated treatment well   Behavior During Therapy [facility] for tasks assessed/performed      Past Medical History:  Diagnosis Date  . Allergy    SEASONAL  . Arthritis   . CAD (coronary artery disease)    a. Myoview 10/15 - normal EF 70% // Myoview 11/16: EF 75%, normal perfusion, (there was no blood pressure drop at low level exercise with Lexiscan infusion), low risk study // c. LHC 3/17 - LAD irregs, oLCx 70 (neg FFR), mRCA 30, EF 55-65% >> med Rx  . Carotid stenosis    a. Carotid US 9/15 - Bilateral 1-39% ICA >> FU 2 years // b. Bilateral ICA 1-39 >> FU prn  . Esophageal reflux   . Hematochezia   . History of echocardiogram    a. Echo 10/15 - EF 60-65%, no RWMA  . HLD (hyperlipidemia)   . Hx of colonoscopy   . Osteoporosis   . Other chronic pulmonary heart diseases   . Paroxysmal supraventricular tachycardia ([place])   . Sarcoidosis [facility])     Past Surgical History:  Procedure Laterality Date  . arm surgery Right   . BLADDER SURGERY    . CARDIAC CATHETERIZATION N/A 05/20/2015   Procedure: Left Heart Cath and Coronary Angiography;  Surgeon: Sherren Mocha, MD;  Location: [place] [facility];  Service: Cardiovascular;  Laterality: N/A;  . CARPAL TUNNEL RELEASE Left   . COLONOSCOPY    . FOOT SURGERY Right   . HIP  SURGERY Right 2011   full replacement  . LEG SURGERY Left May 2014   femur fracture s/p open and closed reduction in [place], Dr. Jimmye Norman  . POLYPECTOMY    . SHOULDER ARTHROSCOPY W/ ROTATOR CUFF REPAIR Right   . TRAPEZIUM RESECTION Right     There were no vitals filed for this visit.      Subjective Assessment - 02/23/16 0848    Subjective doing exercises "not like I should" but feels she's getting better getting up and down   Pertinent History sarcoidosis, R THA, L hip fx s/p ORIF, osteoporosis, CAD   Limitations House hold activities;Walking   Patient Stated Goals improve strength   Currently in Pain? No/denies                         [facility] Adult PT Treatment/Exercise - 02/23/16 0917      Knee/Hip Exercises: Aerobic   Nustep L5x10 min             Balance Exercises - 02/23/16 0853      Balance Exercises: Standing   Standing Eyes Opened Foam/compliant surface;Narrow base of support (BOS);Head turns   Standing Eyes Closed Foam/compliant surface;Narrow base of support (BOS);5 reps;10 secs   Rockerboard Anterior/posterior;Lateral;EO;Head turns;10 reps;Intermittent UE support  min A   Other Standing Exercises single tap/double tap/kicking over, uprighting 8" cones with min A           PT Education - 02/23/16 0923    Education provided Yes   Education Details balance HEP   Person(s) Educated Patient   Methods Explanation;Demonstration;Handout   Comprehension Verbalized understanding;Returned demonstration          PT Short Term Goals - 02/02/16 1455      PT SHORT TERM GOAL #1   Title verbalize understanding of fall prevention strategies (Target Date for all STGs: 03/01/16)   Time 4   Period Weeks   Status New     PT SHORT TERM GOAL #2   Title improve BERG balance score to >/= 46/56 for improved balance and decreased fall risk    Status New     PT SHORT TERM GOAL #3   Title amb > 300' on indoor/paved outdoor surfaces independently for  improved community access            PT Long Term Goals - 02/02/16 1458      PT LONG TERM GOAL #1   Title independent with HEP (Target Date for all LTGs: 03/29/16)   Time 8   Period Weeks   Status New     PT LONG TERM GOAL #2   Title improve BERG balance score to >/= 49/56 for improved balance   Status New     PT LONG TERM GOAL #3   Title improve timed up and go to < 13 sec for improved mobility    Status New     PT LONG TERM GOAL #4   Title amb > 500' on various indoor/outdoor surfaces independently for improved function   Status New               Plan - 02/23/16 [id number]    Clinical Impression Statement Session focused mainly on balance exercises, which pt has difficulty on compliant surfaces and SLS activities.  Will continue to benefit from PT to maximize function.   PT Treatment/Interventions ADLs/Self Care Home Management;Cryotherapy;Moist Heat;Neuromuscular re-education;Therapeutic exercise;Balance training;Therapeutic activities;Functional mobility training;Stair training;Gait training;Patient/family education;Vestibular;Energy conservation   PT Next Visit Plan review balance exercises; continue strengthening and balance activities   Consulted and Agree with Plan of Care Patient      Patient will benefit from skilled therapeutic intervention in order to improve the following deficits and impairments:  Abnormal gait, Decreased activity tolerance, Decreased balance, Postural dysfunction, Decreased strength, Decreased mobility  Visit Diagnosis: Muscle weakness (generalized)  Abnormal posture  Unsteadiness on feet     Problem List Patient Active Problem List   Diagnosis Date Noted  . Allergic rhinitis 09/15/2015  . CAD (coronary artery disease), native coronary artery 05/20/2015  . Coronary artery disease involving native coronary artery of native heart with angina pectoris ([place])   . CAD (coronary artery disease) 05/18/2015  . Essential hypertension  05/18/2015  . Carotid stenosis 05/18/2015  . Cough 03/19/2015  . Left foot pain 09/02/2014  . DOE (dyspnea on exertion) 12/26/2013  . Abnormality of gait 11/06/2013  . Scoliosis (and kyphoscoliosis), idiopathic 07/30/2013  . 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 Constipation 09/05/2012  . History of sinus tachycardia 09/05/2012  . Closed left subtrochanteric femur fracture S/P Open/closed and reduction, internal medullary fixation  09/05/2012  . Acute posthemorrhagic anemia 08/22/2012  . Lumbar radiculopathy 11/23/2011  . Leg weakness 11/23/2011  . Mitral regurgitation 11/22/2011  . DIARRHEA-PRESUMED INFECTIOUS 08/18/2008  .  RECTAL BLEEDING 08/18/2008  . PERSONAL HX COLONIC POLYPS 08/18/2008  . DEGENERATIVE JOINT DISEASE 08/14/2008  . SKIN CANCER, HX OF 08/14/2008  . CARPAL TUNNEL SYNDROME, HX OF 08/14/2008  . SARCOIDOSIS, PULMONARY 08/08/2008  . HLD (hyperlipidemia) 08/08/2008  . Paroxysmal supraventricular tachycardia ([place]) 08/08/2008  . GERD 08/08/2008       Laureen Abrahams, PT, DPT 02/23/16 9:27 AM    [facility] [address] [place], [place], [address] Phone: [phone]   Fax:  [phone]  Name: SIERRIA ONOFREY MRN: [id number] Date of Birth: [birth year]

## 2016-02-24 ENCOUNTER — Ambulatory Visit (INDEPENDENT_AMBULATORY_CARE_PROVIDER_SITE_OTHER): Payer: Medicare Other | Admitting: Nurse Practitioner

## 2016-02-24 ENCOUNTER — Encounter: Payer: Self-pay | Admitting: Nurse Practitioner

## 2016-02-24 VITALS — BP 114/64 | HR 64 | Ht 63.0 in | Wt 131.0 lb

## 2016-02-24 DIAGNOSIS — Z124 Encounter for screening for malignant neoplasm of cervix: Secondary | ICD-10-CM

## 2016-02-24 DIAGNOSIS — Z01411 Encounter for gynecological examination (general) (routine) with abnormal findings: Secondary | ICD-10-CM

## 2016-02-24 DIAGNOSIS — Z0000 Encounter for general adult medical examination without abnormal findings: Secondary | ICD-10-CM

## 2016-02-24 DIAGNOSIS — N3281 Overactive bladder: Secondary | ICD-10-CM | POA: Diagnosis not present

## 2016-02-24 DIAGNOSIS — D869 Sarcoidosis, unspecified: Secondary | ICD-10-CM | POA: Diagnosis not present

## 2016-02-24 NOTE — Progress Notes (Signed)
Patient ID: [name], female   DOB: 01/23/1935, 82 y.o.   MRN: [id number]  82 y.o. [id number] Widowed  Caucasian Fe here for annual exam.  She feels well and is doing wonderful.  She is taking Myrbetriq for OAB but is unsure if it is really working for her.  She has no problems with urinary frequency or urgency during the day.  At night still having to get up twice - but no leakage.  Cost of medication is also bothersome for her.  She will be a new great grandmother this year.  Patient's last menstrual period was 02/22/1983 (approximate).          Sexually active: No.  The current method of family planning is abstinence.    Exercising: No.  The patient does not participate in regular exercise at present. Smoker:  no  Health Maintenance: Pap:12/02/13, Negative  MMG:06/19/15, 3D, Bi-Rads 1: Negative Colonoscopy: 09/18/13 1 polyps removed, WNL,  BMD:05/07/13, T Score -3.8 Left Forearm Total / 13.5 Right Forearm Total / 0.3 Spine Total (hips not calculated due to hardware) followed by PCP and takes Prolia TDaP:01/14/12 Shingles: 11/08/13 Pneumonia: 02/25/14 (Prevnar 13), 05/03/01 Pneumovax Hep C and HIV: N/A due to age Labs: PCP in EPIC   reports that she has never smoked. She has never used smokeless tobacco. She reports that she does not drink alcohol or use drugs.  Past Medical History:  Diagnosis Date  . Allergy    SEASONAL  . Arthritis   . CAD (coronary artery disease)    a. Myoview 10/15 - normal EF 70% // Myoview 11/16: EF 75%, normal perfusion, (there was no blood pressure drop at low level exercise with Lexiscan infusion), low risk study // c. LHC 3/17 - LAD irregs, oLCx 70 (neg FFR), mRCA 30, EF 55-65% >> med Rx  . Carotid stenosis    a. Carotid US 9/15 - Bilateral 1-39% ICA >> FU 2 years // b. Bilateral ICA 1-39 >> FU prn  . Esophageal reflux   . Hematochezia   . History of echocardiogram    a. Echo 10/15 - EF 60-65%, no RWMA  . HLD (hyperlipidemia)   . Hx of colonoscopy    . Osteoporosis   . Other chronic pulmonary heart diseases   . Paroxysmal supraventricular tachycardia ([place])   . Sarcoidosis [facility])     Past Surgical History:  Procedure Laterality Date  . arm surgery Right   . BLADDER SURGERY    . CARDIAC CATHETERIZATION N/A 05/20/2015   Procedure: Left Heart Cath and Coronary Angiography;  Surgeon: Sherren Mocha, MD;  Location: [place] [facility];  Service: Cardiovascular;  Laterality: N/A;  . CARPAL TUNNEL RELEASE Left   . COLONOSCOPY    . FOOT SURGERY Right   . HIP SURGERY Right 2011   full replacement  . LEG SURGERY Left May 2014   femur fracture s/p open and closed reduction in [place], Dr. Jimmye Norman  . POLYPECTOMY    . SHOULDER ARTHROSCOPY W/ ROTATOR CUFF REPAIR Right   . TRAPEZIUM RESECTION Right     Current Outpatient Prescriptions  Medication Sig Dispense Refill  . albuterol (PROVENTIL HFA;VENTOLIN HFA) 108 (90 BASE) MCG/ACT inhaler Inhale 2 puffs into the lungs every 4 (four) hours as needed for wheezing or shortness of breath. 1 Inhaler 6  . aspirin EC 81 MG tablet Take 1 tablet (81 mg total) by mouth daily.    . calcium carbonate (TUMS - DOSED IN MG ELEMENTAL CALCIUM) 500 MG chewable tablet  Chew 1 tablet by mouth daily. Pt isn't sure of dose.    . diltiazem (CARDIZEM CD) 180 MG 24 hr capsule Take 1 capsule (180 mg total) by mouth daily. 90 capsule 3  . fluticasone (FLONASE) 50 MCG/ACT nasal spray Place 2 sprays into both nostrils daily. 16 g 5  . loratadine (CLARITIN) 10 MG tablet Take 1 tablet (10 mg total) by mouth daily. 30 tablet 11  . MYRBETRIQ 50 MG TB24 tablet Take 50 mg by mouth daily.     . nitroGLYCERIN (NITROSTAT) 0.4 MG SL tablet Place 1 tablet (0.4 mg total) under the tongue every 5 (five) minutes as needed for chest pain. 25 tablet 3  . omeprazole (PRILOSEC) 40 MG capsule Take 40 mg by mouth daily.     . rosuvastatin (CRESTOR) 10 MG tablet Take 1 tablet by mouth two times per week only 24 tablet 3  . traMADol (ULTRAM) 50  MG tablet Take 50 mg by mouth every 6 (six) hours as needed for severe pain.     . Vitamin D, Ergocalciferol, (DRISDOL) 50000 UNITS CAPS Take 50,000 Units by mouth every 7 (seven) days.      No current facility-administered medications for this visit.     Family History  Problem Relation Age of Onset  . Colon cancer Mother 31  . Cancer Mother   . Lung cancer Father   . Heart disease Father   . Arrhythmia Father   . Cancer Father   . Heart attack Brother   . Esophageal cancer Neg Hx   . Rectal cancer Neg Hx   . Stomach cancer Neg Hx   . Stroke Neg Hx     ROS:  Pertinent items are noted in HPI.  Otherwise, a comprehensive ROS was negative.  Exam:   BP 114/64 (BP Location: Right Arm, Patient Position: Sitting, Cuff Size: Normal)   Pulse 64   Ht 5\' 3"  (1.6 m)   Wt 131 lb (59.4 kg)   LMP 02/22/1983 (Approximate)   BMI 23.21 kg/m  Height: 5\' 3"  (160 cm) Ht Readings from Last 3 Encounters:  02/24/16 5\' 3"  (1.6 m)  01/08/16 5\' 3"  (1.6 m)  12/02/15 5\' 3"  (1.6 m)    General appearance: alert, cooperative and appears stated age Head: Normocephalic, without obvious abnormality, atraumatic Neck: no adenopathy, supple, symmetrical, trachea midline and thyroid normal to inspection and palpation Lungs: clear to auscultation bilaterally Breasts: normal appearance, no masses or tenderness Heart: regular rate and rhythm Abdomen: soft, non-tender; no masses,  no organomegaly Extremities: extremities normal, atraumatic, no cyanosis or edema Skin: Skin color, texture, turgor normal. No rashes or lesions Lymph nodes: Cervical, supraclavicular, and axillary nodes normal. No abnormal inguinal nodes palpated Neurologic: Grossly normal   Pelvic: External genitalia:  no lesions              Urethra:  normal appearing urethra with no masses, tenderness or lesions              Bartholin's and Skene's: normal                 Vagina: normal appearing vagina with normal color and discharge, no  lesions              Cervix: anteverted              Pap taken: Yes.  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

## 2016-02-24 NOTE — Patient Instructions (Addendum)

## 2016-02-25 ENCOUNTER — Ambulatory Visit: Payer: Medicare Other | Admitting: Physical Therapy

## 2016-02-25 DIAGNOSIS — R293 Abnormal posture: Secondary | ICD-10-CM

## 2016-02-25 DIAGNOSIS — R2681 Unsteadiness on feet: Secondary | ICD-10-CM

## 2016-02-25 DIAGNOSIS — M6281 Muscle weakness (generalized): Secondary | ICD-10-CM

## 2016-02-25 LAB — IPS PAP SMEAR ONLY

## 2016-02-25 NOTE — Therapy (Signed)
[facility] [address] [place], [place], [address] Phone: [phone]   Fax:  [phone]  Physical Therapy Treatment  Patient Details  Name: [name] MRN: [id number] Date of Birth: [birth year] Referring Provider: Dr. [name]  Encounter Date: 02/25/2016      PT End of Session - 02/25/16 1154    Visit Number 5   Number of Visits 16   Date for PT Re-Evaluation 03/29/16   Authorization Type Medicare- G Code/Progress Note every 10th visit   PT Start Time 1102   PT Stop Time 1143   PT Time Calculation (min) 41 min   Equipment Utilized During Treatment Gait belt   Activity Tolerance Patient tolerated treatment well   Behavior During Therapy [facility] for tasks assessed/performed      Past Medical History:  Diagnosis Date  . Allergy    SEASONAL  . Arthritis   . CAD (coronary artery disease)    a. Myoview 10/15 - normal EF 70% // Myoview 11/16: EF 75%, normal perfusion, (there was no blood pressure drop at low level exercise with Lexiscan infusion), low risk study // c. LHC 3/17 - LAD irregs, oLCx 70 (neg FFR), mRCA 30, EF 55-65% >> med Rx  . Carotid stenosis    a. Carotid US 9/15 - Bilateral 1-39% ICA >> FU 2 years // b. Bilateral ICA 1-39 >> FU prn  . Esophageal reflux   . Hematochezia   . History of echocardiogram    a. Echo 10/15 - EF 60-65%, no RWMA  . HLD (hyperlipidemia)   . Hx of colonoscopy   . Osteoporosis   . Other chronic pulmonary heart diseases   . Paroxysmal supraventricular tachycardia ([place])   . Sarcoidosis [facility])     Past Surgical History:  Procedure Laterality Date  . arm surgery Right   . BLADDER SURGERY    . CARDIAC CATHETERIZATION N/A 05/20/2015   Procedure: Left Heart Cath and Coronary Angiography;  Surgeon: Sherren Mocha, MD;  Location: [place] [facility];  Service: Cardiovascular;  Laterality: N/A;  . CARPAL TUNNEL RELEASE Left   . COLONOSCOPY    . FOOT SURGERY Right   . HIP  SURGERY Right 2011   full replacement  . LEG SURGERY Left May 2014   femur fracture s/p open and closed reduction in [place], Dr. Jimmye Norman  . POLYPECTOMY    . SHOULDER ARTHROSCOPY W/ ROTATOR CUFF REPAIR Right   . TRAPEZIUM RESECTION Right     There were no vitals filed for this visit.      Subjective Assessment - 02/25/16 1102    Subjective doing well.     Pertinent History sarcoidosis, R THA, L hip fx s/p ORIF, osteoporosis, CAD   Limitations House hold activities;Walking   Patient Stated Goals improve strength   Currently in Pain? No/denies                         [facility] Adult PT Treatment/Exercise - 02/25/16 1133      Knee/Hip Exercises: Supine   Bridges 2 sets;10 reps   Straight Leg Raises Right;Left;1 set;10 reps   Straight Leg Raises Limitations 3lb ankle weights             Balance Exercises - 02/25/16 1117      Balance Exercises: Standing   Standing Eyes Opened Foam/compliant surface;Narrow base of support (BOS);Head turns;Other reps (comment)   Standing Eyes Closed Foam/compliant surface;Narrow base of support (BOS);5 reps;10  secs;Head turns   Rockerboard Anterior/posterior;Lateral;EO;Head turns;10 reps;Intermittent UE support   Balance Beam Red balance beam; vertical/horizontal head turns with UE support; alternating forward foot taps and forward and backwards kicks without UE support           PT Education - 02/25/16 1154    Education provided Yes   Education Details reviewed and progressed balance HEP   Person(s) Educated Patient   Methods Explanation;Demonstration   Comprehension Verbalized understanding;Returned demonstration          PT Short Term Goals - 02/02/16 1455      PT SHORT TERM GOAL #1   Title verbalize understanding of fall prevention strategies (Target Date for all STGs: 03/01/16)   Time 4   Period Weeks   Status New     PT SHORT TERM GOAL #2   Title improve BERG balance score to >/= 46/56 for improved  balance and decreased fall risk    Status New     PT SHORT TERM GOAL #3   Title amb > 300' on indoor/paved outdoor surfaces independently for improved community access            PT Long Term Goals - 02/02/16 1458      PT LONG TERM GOAL #1   Title independent with HEP (Target Date for all LTGs: 03/29/16)   Time 8   Period Weeks   Status New     PT LONG TERM GOAL #2   Title improve BERG balance score to >/= 49/56 for improved balance   Status New     PT LONG TERM GOAL #3   Title improve timed up and go to < 13 sec for improved mobility    Status New     PT LONG TERM GOAL #4   Title amb > 500' on various indoor/outdoor surfaces independently for improved function   Status New               Plan - 02/25/16 1156    Clinical Impression Statement Able to progress one corner exercise standing on compliant surface with feet together, head turns with eyes closed.  Pt demonstrated improved stability on rocker board as compared to last session with decreased UE support.  Will continue to benefit from PT to maximize function.   PT Treatment/Interventions ADLs/Self Care Home Management;Cryotherapy;Moist Heat;Neuromuscular re-education;Therapeutic exercise;Balance training;Therapeutic activities;Functional mobility training;Stair training;Gait training;Patient/family education;Vestibular;Energy conservation   PT Next Visit Plan begin checking short term goals and outcome measures, continue to progress strengthening and balance activities   Consulted and Agree with Plan of Care Patient      Patient will benefit from skilled therapeutic intervention in order to improve the following deficits and impairments:  Abnormal gait, Decreased activity tolerance, Decreased balance, Postural dysfunction, Decreased strength, Decreased mobility  Visit Diagnosis: Muscle weakness (generalized)  Abnormal posture  Unsteadiness on feet     Problem List Patient Active Problem List    Diagnosis Date Noted  . Allergic rhinitis 09/15/2015  . CAD (coronary artery disease), native coronary artery 05/20/2015  . Coronary artery disease involving native coronary artery of native heart with angina pectoris ([place])   . CAD (coronary artery disease) 05/18/2015  . Essential hypertension 05/18/2015  . Carotid stenosis 05/18/2015  . Cough 03/19/2015  . Left foot pain 09/02/2014  . DOE (dyspnea on exertion) 12/26/2013  . Abnormality of gait 11/06/2013  . Scoliosis (and kyphoscoliosis), idiopathic 07/30/2013  . 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 Constipation 09/05/2012  .  History of sinus tachycardia 09/05/2012  . Closed left subtrochanteric femur fracture S/P Open/closed and reduction, internal medullary fixation  09/05/2012  . Acute posthemorrhagic anemia 08/22/2012  . Lumbar radiculopathy 11/23/2011  . Leg weakness 11/23/2011  . Mitral regurgitation 11/22/2011  . DIARRHEA-PRESUMED INFECTIOUS 08/18/2008  . RECTAL BLEEDING 08/18/2008  . PERSONAL HX COLONIC POLYPS 08/18/2008  . DEGENERATIVE JOINT DISEASE 08/14/2008  . SKIN CANCER, HX OF 08/14/2008  . CARPAL TUNNEL SYNDROME, HX OF 08/14/2008  . SARCOIDOSIS, PULMONARY 08/08/2008  . HLD (hyperlipidemia) 08/08/2008  . Paroxysmal supraventricular tachycardia ([place]) 08/08/2008  . GERD 08/08/2008   Laureen Abrahams, PT, DPT 02/25/16 12:04 PM  [facility] [address] [place], [place], [address] Phone: [phone]   Fax:  [phone]  Name: DESIRAE REICHMANN MRN: [id number] Date of Birth: 1934/05/26

## 2016-02-25 NOTE — Patient Instructions (Signed)
Progressed corner balance exercise with feet together, head turns with eyes closed.

## 2016-02-26 ENCOUNTER — Other Ambulatory Visit: Payer: Self-pay | Admitting: Nurse Practitioner

## 2016-02-28 NOTE — Progress Notes (Signed)
Encounter reviewed by Dr. Brook Amundson C. Silva.  

## 2016-03-01 ENCOUNTER — Ambulatory Visit: Payer: Medicare Other | Admitting: Physical Therapy

## 2016-03-01 DIAGNOSIS — M6281 Muscle weakness (generalized): Secondary | ICD-10-CM

## 2016-03-01 DIAGNOSIS — R2681 Unsteadiness on feet: Secondary | ICD-10-CM

## 2016-03-01 DIAGNOSIS — R293 Abnormal posture: Secondary | ICD-10-CM

## 2016-03-01 NOTE — Therapy (Signed)
[facility] [address] [place], [place], [address] Phone: [phone]   Fax:  [phone]  Physical Therapy Treatment/Discharge  Patient Details  Name: [name] MRN: [id number] Date of Birth: 1934/09/02 Referring Provider: Dr. [name]  Encounter Date: 03/01/2016      PT End of Session - 03/01/16 1255    Visit Number 6   Authorization Type Medicare- G Code/Progress Note every 10th visit   PT Start Time 1100   PT Stop Time 1145   PT Time Calculation (min) 45 min   Equipment Utilized During Treatment Gait belt   Activity Tolerance Patient tolerated treatment well   Behavior During Therapy [facility] for tasks assessed/performed      Past Medical History:  Diagnosis Date  . Allergy    SEASONAL  . Arthritis   . CAD (coronary artery disease)    a. Myoview 10/15 - normal EF 70% // Myoview 11/16: EF 75%, normal perfusion, (there was no blood pressure drop at low level exercise with Lexiscan infusion), low risk study // c. LHC 3/17 - LAD irregs, oLCx 70 (neg FFR), mRCA 30, EF 55-65% >> med Rx  . Carotid stenosis    a. Carotid US 9/15 - Bilateral 1-39% ICA >> FU 2 years // b. Bilateral ICA 1-39 >> FU prn  . Esophageal reflux   . Hematochezia   . History of echocardiogram    a. Echo 10/15 - EF 60-65%, no RWMA  . HLD (hyperlipidemia)   . Hx of colonoscopy   . Osteoporosis   . Other chronic pulmonary heart diseases   . Paroxysmal supraventricular tachycardia ([place])   . Sarcoidosis [facility])     Past Surgical History:  Procedure Laterality Date  . arm surgery Right   . BLADDER SURGERY    . CARDIAC CATHETERIZATION N/A 05/20/2015   Procedure: Left Heart Cath and Coronary Angiography;  Surgeon: Sherren Mocha, MD;  Location: [place] [facility];  Service: Cardiovascular;  Laterality: N/A;  . CARPAL TUNNEL RELEASE Left   . COLONOSCOPY    . FOOT SURGERY Right   . HIP SURGERY Right 2011   full replacement  . LEG  SURGERY Left May 2014   femur fracture s/p open and closed reduction in [place], Dr. Jimmye Norman  . POLYPECTOMY    . SHOULDER ARTHROSCOPY W/ ROTATOR CUFF REPAIR Right   . TRAPEZIUM RESECTION Right     There were no vitals filed for this visit.      Subjective Assessment - 03/01/16 1103    Subjective doing well; feels better now that weather is nicer.   Pertinent History sarcoidosis, R THA, L hip fx s/p ORIF, osteoporosis, CAD   Patient Stated Goals improve strength   Currently in Pain? No/denies            [facility] PT Assessment - 03/01/16 1111      Observation/Other Assessments   Focus on Therapeutic Outcomes (FOTO)  78 (22% limited)   Activities of Balance Confidence Scale (ABC Scale)  86.3% confident     Berg Balance Test   Sit to Stand Able to stand without using hands and stabilize independently   Standing Unsupported Able to stand safely 2 minutes   Sitting with Back Unsupported but Feet Supported on Floor or Stool Able to sit safely and securely 2 minutes   Stand to Sit Sits safely with minimal use of hands   Transfers Able to transfer safely, minor use of hands   Standing Unsupported with  Eyes Closed Able to stand 10 seconds safely   Standing Ubsupported with Feet Together Able to place feet together independently and stand 1 minute safely   From Standing, Reach Forward with Outstretched Arm Can reach confidently >25 cm (10")   From Standing Position, Pick up Object from [place] to pick up shoe safely and easily   From Standing Position, Turn to Look Behind Over each Shoulder Looks behind from both sides and weight shifts well   Turn 360 Degrees Able to turn 360 degrees safely in 4 seconds or less   Standing Unsupported, Alternately Place Feet on Step/Stool Able to stand independently and safely and complete 8 steps in 20 seconds   Standing Unsupported, One Foot in [place] to plae foot ahead of the other independently and hold 30 seconds   Standing on One Leg Able to  lift leg independently and hold equal to or more than 3 seconds   Total Score 53     Timed Up and Go Test   Normal TUG (seconds) 10.31                     [facility] Adult PT Treatment/Exercise - 03/01/16 1128      Ambulation/Gait   Ambulation/Gait Assistance 7: Independent   Ambulation Distance (Feet) 500 Feet   Assistive device None   Ambulation Surface Indoor;Outdoor;Paved;Gravel;Grass     Self-Care   Self-Care Other Self-Care Comments   Other Self-Care Comments  educated on fall prevention strategies     Knee/Hip Exercises: Seated   Sit to Sand 5 reps;without UE support     Knee/Hip Exercises: Supine   Bridges Strengthening;Both;5 reps   Bridges with Clamshell Strengthening;Both;5 reps  green theraband     Knee/Hip Exercises: Sidelying   Clams x5 bil with green theraband             Balance Exercises - 03/01/16 1141      Balance Exercises: Standing   Standing Eyes Opened Narrow base of support (BOS);Head turns;Foam/compliant surface;Other reps (comment)  15 reps   Standing Eyes Closed Narrow base of support (BOS);Wide (BOA);Head turns;Foam/compliant surface;Other reps (comment);10 secs  15 reps            PT Education - 03/01/16 1145    Education provided Yes   Education Details fall prevention   Person(s) Educated Patient   Methods Explanation;Handout   Comprehension Verbalized understanding          PT Short Term Goals - 03/01/16 1255      PT SHORT TERM GOAL #1   Title verbalize understanding of fall prevention strategies (Target Date for all STGs: 03/01/16)   Status Achieved     PT SHORT TERM GOAL #2   Title improve BERG balance score to >/= 46/56 for improved balance and decreased fall risk    Status Achieved     PT SHORT TERM GOAL #3   Title amb > 300' on indoor/paved outdoor surfaces independently for improved community access    Status Achieved           PT Long Term Goals - 03/01/16 1255      PT LONG TERM GOAL #1    Title independent with HEP (Target Date for all LTGs: 03/29/16)   Status Achieved     PT LONG TERM GOAL #2   Title improve BERG balance score to >/= 49/56 for improved balance   Status Achieved     PT LONG TERM GOAL #3  Title improve timed up and go to < 13 sec for improved mobility    Status Achieved     PT LONG TERM GOAL #4   Title amb > 500' on various indoor/outdoor surfaces independently for improved function   Status Achieved               Plan - 03-16-16 1256    Clinical Impression Statement Pt has met all goals and is ready for d/c.  Pt low fall risk as indicated by BERG and timed up and go.     PT Treatment/Interventions ADLs/Self Care Home Management;Cryotherapy;Moist Heat;Neuromuscular re-education;Therapeutic exercise;Balance training;Therapeutic activities;Functional mobility training;Stair training;Gait training;Patient/family education;Vestibular;Energy conservation   PT Next Visit Plan d/c PT today   Consulted and Agree with Plan of Care Patient      Patient will benefit from skilled therapeutic intervention in order to improve the following deficits and impairments:  Abnormal gait, Decreased activity tolerance, Decreased balance, Postural dysfunction, Decreased strength, Decreased mobility  Visit Diagnosis: Muscle weakness (generalized)  Abnormal posture  Unsteadiness on feet       G-Codes - 03/16/16 1257    Functional Assessment Tool Used BERG 53/56   Functional Limitation Mobility: Walking and moving around   Mobility: Walking and Moving Around Goal Status [id number]) At least 1 percent but less than 20 percent impaired, limited or restricted   Mobility: Walking and Moving Around Discharge Status [id number]) At least 1 percent but less than 20 percent impaired, limited or restricted      Problem List Patient Active Problem List   Diagnosis Date Noted  . Allergic rhinitis 09/15/2015  . CAD (coronary artery disease), native coronary artery 05/20/2015   . Coronary artery disease involving native coronary artery of native heart with angina pectoris ([place])   . CAD (coronary artery disease) 05/18/2015  . Essential hypertension 05/18/2015  . Carotid stenosis 05/18/2015  . Cough 03/19/2015  . Left foot pain 09/02/2014  . DOE (dyspnea on exertion) 12/26/2013  . Abnormality of gait 11/06/2013  . Scoliosis (and kyphoscoliosis), idiopathic 07/30/2013  . 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 Constipation 09/05/2012  . History of sinus tachycardia 09/05/2012  . Closed left subtrochanteric femur fracture S/P Open/closed and reduction, internal medullary fixation  09/05/2012  . Acute posthemorrhagic anemia 08/22/2012  . Lumbar radiculopathy 11/23/2011  . Leg weakness 11/23/2011  . Mitral regurgitation 11/22/2011  . DIARRHEA-PRESUMED INFECTIOUS 08/18/2008  . RECTAL BLEEDING 08/18/2008  . PERSONAL HX COLONIC POLYPS 08/18/2008  . DEGENERATIVE JOINT DISEASE 08/14/2008  . SKIN CANCER, HX OF 08/14/2008  . CARPAL TUNNEL SYNDROME, HX OF 08/14/2008  . SARCOIDOSIS, PULMONARY 08/08/2008  . HLD (hyperlipidemia) 08/08/2008  . Paroxysmal supraventricular tachycardia ([place]) 08/08/2008  . GERD 08/08/2008       Laureen Abrahams, PT, DPT 03-16-2016 12:59 PM    [facility] [address] [place], [place], [address] Phone: [phone]   Fax:  [phone]  Name: CATTIE TINEO MRN: [id number] Date of Birth: 1934-06-19        PHYSICAL THERAPY DISCHARGE SUMMARY  Visits from Start of Care: 6  Current functional level related to goals / functional outcomes: See above   Remaining deficits: N/a; pt low fall risk as indicated by BERG and timed up and go   Education / Equipment: HEP, fall prevention   Plan: Patient agrees to discharge.  Patient goals were met. Patient is being discharged due to meeting the stated rehab goals.  ?????  Laureen Abrahams, PT,  DPT 03/01/16 1:00 PM  [facility] [address] [place], [place] [address]  [phone] (office) [phone] (fax)

## 2016-03-01 NOTE — Patient Instructions (Signed)
Fall Prevention in the Home Falls can cause injuries and can affect people from all age groups. There are many simple things that you can do to make your home safe and to help prevent falls. What can I do on the outside of my home?  Regularly repair the edges of walkways and driveways and fix any cracks.  Remove high doorway thresholds.  Trim any shrubbery on the main path into your home.  Use bright outdoor lighting.  Clear walkways of debris and clutter, including tools and rocks.  Regularly check that handrails are securely fastened and in good repair. Both sides of any steps should have handrails.  Install guardrails along the edges of any raised decks or porches.  Have leaves, snow, and ice cleared regularly.  Use sand or salt on walkways during winter months.  In the garage, clean up any spills right away, including grease or oil spills. What can I do in the bathroom?  Use night lights.  Install grab bars by the toilet and in the tub and shower. Do not use towel bars as grab bars.  Use non-skid mats or decals on the floor of the tub or shower.  If you need to sit down while you are in the shower, use a plastic, non-slip stool.  Keep the floor dry. Immediately clean up any water that spills on the floor.  Remove soap buildup in the tub or shower on a regular basis.  Attach bath mats securely with double-sided non-slip rug tape.  Remove throw rugs and other tripping hazards from the floor. What can I do in the bedroom?  Use night lights.  Make sure that a bedside light is easy to reach.  Do not use oversized bedding that drapes onto the floor.  Have a firm chair that has side arms to use for getting dressed.  Remove throw rugs and other tripping hazards from the floor. What can I do in the kitchen?  Clean up any spills right away.  Avoid walking on wet floors.  Place frequently used items in easy-to-reach places.  If you need to reach for something above  you, use a sturdy step stool that has a grab bar.  Keep electrical cables out of the way.  Do not use floor polish or wax that makes floors slippery. If you have to use wax, make sure that it is non-skid floor wax.  Remove throw rugs and other tripping hazards from the floor. What can I do in the stairways?  Do not leave any items on the stairs.  Make sure that there are handrails on both sides of the stairs. Fix handrails that are broken or loose. Make sure that handrails are as long as the stairways.  Check any carpeting to make sure that it is firmly attached to the stairs. Fix any carpet that is loose or worn.  Avoid having throw rugs at the top or bottom of stairways, or secure the rugs with carpet tape to prevent them from moving.  Make sure that you have a light switch at the top of the stairs and the bottom of the stairs. If you do not have them, have them installed. What are some other fall prevention tips?  Wear closed-toe shoes that fit well and support your feet. Wear shoes that have rubber soles or low heels.  When you use a stepladder, make sure that it is completely opened and that the sides are firmly locked. Have someone hold the ladder while you are using   it. Do not climb a closed stepladder.  Add color or contrast paint or tape to grab bars and handrails in your home. Place contrasting color strips on the first and last steps.  Use mobility aids as needed, such as canes, walkers, scooters, and crutches.  Turn on lights if it is dark. Replace any light bulbs that burn out.  Set up furniture so that there are clear paths. Keep the furniture in the same spot.  Fix any uneven floor surfaces.  Choose a carpet design that does not hide the edge of steps of a stairway.  Be aware of any and all pets.  Review your medicines with your healthcare provider. Some medicines can cause dizziness or changes in blood pressure, which increase your risk of falling. Talk with  your health care provider about other ways that you can decrease your risk of falls. This may include working with a physical therapist or trainer to improve your strength, balance, and endurance. This information is not intended to replace advice given to you by your health care provider. Make sure you discuss any questions you have with your health care provider. Document Released: 01/28/2002 Document Revised: 07/07/2015 Document Reviewed: 03/14/2014 Elsevier Interactive Patient Education  2017 [employer]  

## 2016-03-03 ENCOUNTER — Ambulatory Visit (INDEPENDENT_AMBULATORY_CARE_PROVIDER_SITE_OTHER): Payer: Self-pay | Admitting: Neurology

## 2016-03-03 ENCOUNTER — Ambulatory Visit (INDEPENDENT_AMBULATORY_CARE_PROVIDER_SITE_OTHER): Payer: Medicare Other | Admitting: Neurology

## 2016-03-03 ENCOUNTER — Ambulatory Visit: Payer: Medicare Other | Admitting: Physical Therapy

## 2016-03-03 DIAGNOSIS — R5382 Chronic fatigue, unspecified: Secondary | ICD-10-CM

## 2016-03-03 DIAGNOSIS — R42 Dizziness and giddiness: Secondary | ICD-10-CM | POA: Diagnosis not present

## 2016-03-03 DIAGNOSIS — R202 Paresthesia of skin: Secondary | ICD-10-CM | POA: Diagnosis not present

## 2016-03-03 DIAGNOSIS — M6281 Muscle weakness (generalized): Secondary | ICD-10-CM

## 2016-03-03 DIAGNOSIS — R2689 Other abnormalities of gait and mobility: Secondary | ICD-10-CM | POA: Diagnosis not present

## 2016-03-03 DIAGNOSIS — R531 Weakness: Secondary | ICD-10-CM

## 2016-03-03 DIAGNOSIS — M6289 Other specified disorders of muscle: Secondary | ICD-10-CM | POA: Diagnosis not present

## 2016-03-03 DIAGNOSIS — G629 Polyneuropathy, unspecified: Secondary | ICD-10-CM

## 2016-03-03 DIAGNOSIS — Z0289 Encounter for other administrative examinations: Secondary | ICD-10-CM

## 2016-03-03 NOTE — Progress Notes (Addendum)
[facility]    Provider:  Dr Jaynee Eagles Referring Provider: [name], MD Primary Care Physician:  [name], MD   HPI:  [name] is a 82 y.o. female here for emg/ncs to evaluate fatigue and weakness. Past medical history of hypertension, carotid bruit right, degenerative joint disease, fatigue, sarcoidosis, hyperlipidemia, left leg femur broken status- post rods, right hip replacement. She has significant fatigue. She complains of generalized weakness, fatigue, imbalance, sinking feelings like she is going to pass out, decreased energy, decreased smell and loss of taste, generally feeling bad. Extensive lab testing including CK, acetylcholine receptor antibodies, B1, multiple myeloma panel, heavy metals, rheumatoid factor, Sjogren's antibodies, ANA with reflex, sedimentation rate, angiotensin-converting enzyme, TSH, methylmalonic acid, B12 and folate all normal. MRI of the brain showed Mild diffuse and moderate perisylvian atrophy and mild small vessel disease but no etiology for her symptoms. EMG/NCS today to evaluate for other neuromuscular disorders such as myopathy/myositis. Reviewed all results with patient today.    Summary: NCS were performed on the right arm and right leg: All nerves (as indicated in the following tables) were within normal limits.    EMG needle study was performed on right arm and leg. The following muscles were within normal limits: right Deltoid, right Triceps, right Pronator Teres, right Opponens Pollicis, right First Dorsal interosseous, right Vastus Medialis, right Gluteus Medius and Maximus, right Biceps Femoris(long head), right Anterior Tibialis, right Medial Gastrocnemius, right Extensor Hallucis Longus muscles and lower lumbar paraspinals were within normal limits.  Conclusion: This is a normal study. No electrophysiologic evidence for polyneuropathy, mononeuropathy, radiculopathy or neuromuscular disease.    [place]      Nerve / Sites Rec. Site Peak Lat Ref.  Amp Ref. Segments Distance    ms ms V V  cm  R Median - Orthodromic (Dig II, Mid palm)     Dig II Wrist 3.1 ?3.4 24 ?10 Dig II - Wrist 13  R Ulnar - Orthodromic, (Dig V, Mid palm)     Dig V Wrist 2.6 ?3.1 13 ?5 Dig V - Wrist 11  R Sural - Ankle (Calf)     Calf Ankle 3.2 ?4.4 11 ?6 Calf - Ankle 14  R Superficial peroneal - Ankle     Lat leg Ankle 4.4 ?4.4 6 ?6 Lat leg - Ankle 14             MNC    Nerve / Sites Muscle Latency Ref. Amplitude Ref. Rel Amp Segments Distance Lat Diff Velocity Ref. Area    ms ms mV mV %  cm ms m/s m/s mVms  R Median - APB     Wrist APB 3.5 ?4.4 2.5 ?4.0 100 Wrist - APB 7    6.2     Upper arm APB 7.4  3.3  136 Upper arm - Wrist 23 4.0 58  8.3  R Ulnar - ADM     Wrist ADM 2.8 ?3.3 6.1 ?6.0 100 Wrist - ADM 7    21.1     B.Elbow ADM 5.5  5.3  87.5 B.Elbow - Wrist 16 2.8 58 ?49 19.9     A.Elbow ADM 7.7  5.1  95.4 A.Elbow - B.Elbow 12 2.1 56 ?49 20.0         A.Elbow - Wrist  4.9     R Peroneal - EDB     Ankle EDB 5.3 ?6.5 4.1 ?2.0 100 Ankle - EDB 9    12.8     Fib head  EDB 12.0  3.5  85 Fib head - Ankle 31 6.7 47 ?44 12.6     Pop fossa EDB 14.2  3.3  96.3 Pop fossa - Fib head 10 2.2 46 ?44 10.4         Pop fossa - Ankle  8.9     R Tibial - AH     Ankle AH 5.7 ?5.8 4.0 ?4.0 100 Ankle - AH 9    6.0     Pop fossa AH 14.2  1.5  51.6 Pop fossa - Ankle 37 8.5 43 ?41 5.8             F  Wave    Nerve F Lat Ref.   ms ms  R Ulnar - ADM 26.6 ?32.0  R Tibial - AH 55.5 ?56.0          [name], MD  [facility] [address] [place],  [address]  Phone [phone] Fax [phone]  A total of 25 minutes was spent face-to-face with this patient. Over half this time was spent on counseling patient on the weakness diagnosis and different diagnostic and therapeutic options available. This time is not inclusive of time spent performing emg/ncs today, 25 minutes is in addition to  procedure testing.

## 2016-03-04 NOTE — Progress Notes (Signed)
See procedure note.

## 2016-03-05 NOTE — Procedures (Signed)
[facility]    Provider:  Dr Jaynee Eagles Referring Provider: [name], MD Primary Care Physician:  Sherri Ringer, MD   HPI:  [name] is a 82 y.o. female here for emg/ncs to evaluate fatigue and weakness. Past medical history of hypertension, carotid bruit right, degenerative joint disease, fatigue, sarcoidosis, hyperlipidemia, left leg femur broken status- post rods, right hip replacement. She has significant fatigue. She complains of generalized weakness, fatigue, imbalance, sinking feelings like she is going to pass out, decreased energy, decreased smell and loss of taste, generally feeling bad. Extensive lab testing including CK, acetylcholine receptor antibodies, B1, multiple myeloma panel, heavy metals, rheumatoid factor, Sjogren's antibodies, ANA with reflex, sedimentation rate, angiotensin-converting enzyme, TSH, methylmalonic acid, B12 and folate all normal. MRI of the brain showed Mild diffuse and moderate perisylvian atrophy and mild small vessel disease but no etiology for her symptoms. EMG/NCS today to evaluate for other neuromuscular disorders such as myopathy/myositis. Reviewed all results with patient today.    Summary: NCS were performed on the right arm and right leg: All nerves (as indicated in the following tables) were within normal limits.    EMG needle study was performed on right arm and leg. The following muscles were within normal limits: right Deltoid, right Triceps, right Pronator Teres, right Opponens Pollicis, right First Dorsal interosseous, right Vastus Medialis, right Gluteus Medius and Maximus, right Biceps Femoris(long head), right Anterior Tibialis, right Medial Gastrocnemius, right Extensor Hallucis Longus muscles and lower lumbar paraspinals were within normal limits.  Conclusion: This is a normal study. No electrophysiologic evidence for polyneuropathy, mononeuropathy, radiculopathy or neuromuscular disease.    [place]    Nerve  / Sites Rec. Site Peak Lat Ref.  Amp Ref. Segments Distance    ms ms V V  cm  R Median - Orthodromic (Dig II, Mid palm)     Dig II Wrist 3.1 ?3.4 24 ?10 Dig II - Wrist 13  R Ulnar - Orthodromic, (Dig V, Mid palm)     Dig V Wrist 2.6 ?3.1 13 ?5 Dig V - Wrist 11  R Sural - Ankle (Calf)     Calf Ankle 3.2 ?4.4 11 ?6 Calf - Ankle 14  R Superficial peroneal - Ankle     Lat leg Ankle 4.4 ?4.4 6 ?6 Lat leg - Ankle 14             MNC    Nerve / Sites Muscle Latency Ref. Amplitude Ref. Rel Amp Segments Distance Lat Diff Velocity Ref. Area    ms ms mV mV %  cm ms m/s m/s mVms  R Median - APB     Wrist APB 3.5 ?4.4 2.5 ?4.0 100 Wrist - APB 7    6.2     Upper arm APB 7.4  3.3  136 Upper arm - Wrist 23 4.0 58  8.3  R Ulnar - ADM     Wrist ADM 2.8 ?3.3 6.1 ?6.0 100 Wrist - ADM 7    21.1     B.Elbow ADM 5.5  5.3  87.5 B.Elbow - Wrist 16 2.8 58 ?49 19.9     A.Elbow ADM 7.7  5.1  95.4 A.Elbow - B.Elbow 12 2.1 56 ?49 20.0         A.Elbow - Wrist  4.9     R Peroneal - EDB     Ankle EDB 5.3 ?6.5 4.1 ?2.0 100 Ankle - EDB 9    12.8     Fib head EDB  12.0  3.5  85 Fib head - Ankle 31 6.7 47 ?44 12.6     Pop fossa EDB 14.2  3.3  96.3 Pop fossa - Fib head 10 2.2 46 ?44 10.4         Pop fossa - Ankle  8.9     R Tibial - AH     Ankle AH 5.7 ?5.8 4.0 ?4.0 100 Ankle - AH 9    6.0     Pop fossa AH 14.2  1.5  51.6 Pop fossa - Ankle 37 8.5 43 ?41 5.8             F  Wave    Nerve F Lat Ref.   ms ms  R Ulnar - ADM 26.6 ?32.0  R Tibial - AH 55.5 ?56.0          Sherri Ill, MD  [facility] [address] [place], [place] [address]  Phone [phone] Fax [phone]  Cc: Dr. Dagmar Mcgrath  A total of 25 minutes was spent face-to-face with this patient. Over half this time was spent on counseling patient on the weakness diagnosis and different diagnostic and therapeutic options available. This time is not inclusive of time spent performing emg/ncs today, 25 minutes is in  addition to procedure testing.

## 2016-03-08 DIAGNOSIS — R209 Unspecified disturbances of skin sensation: Secondary | ICD-10-CM | POA: Insufficient documentation

## 2016-03-09 ENCOUNTER — Ambulatory Visit: Payer: Medicare Other | Admitting: Physical Therapy

## 2016-03-11 ENCOUNTER — Ambulatory Visit: Payer: Medicare Other | Admitting: Physical Therapy

## 2016-03-14 ENCOUNTER — Ambulatory Visit (INDEPENDENT_AMBULATORY_CARE_PROVIDER_SITE_OTHER): Payer: Medicare Other | Admitting: Emergency Medicine

## 2016-03-14 ENCOUNTER — Encounter: Payer: Self-pay | Admitting: Emergency Medicine

## 2016-03-14 VITALS — BP 122/60 | HR 62 | Ht 63.0 in | Wt 131.4 lb

## 2016-03-14 DIAGNOSIS — D869 Sarcoidosis, unspecified: Secondary | ICD-10-CM

## 2016-03-14 MED ORDER — FLUTICASONE PROPIONATE 50 MCG/ACT NA SUSP: 2.0000 | Freq: Every day | NASAL | 5 refills | Status: DC

## 2016-03-14 MED ORDER — ALBUTEROL SULFATE HFA 108 (90 BASE) MCG/ACT IN AERS: 2.0000 | INHALATION_SPRAY | RESPIRATORY_TRACT | 6 refills | Status: DC | PRN

## 2016-03-14 NOTE — Assessment & Plan Note (Signed)
Overall she appears to be clinically stable. She does still have some exertional dyspnea. I will repeat her CT scan of the chest to compare with October 2016. If this is stable we may be able to convert to following chest x-rays. She has not been using Symbicort or any bronchodilator with irregularity. I will stop the Symbicort, have her use albuterol when necessary.

## 2016-03-14 NOTE — Patient Instructions (Signed)
We will repeat your Ct scan of the chest, high resolution without contrast, to compare with prior scan from 02/2014.  Please stop symbicort altogether Take albuterol 2 puffs up to every 4 hours if needed for shortness of breath.  Continue your loratadine 10mg  daily We will refill fluticasone nasal spray, 2 sprays each nostril daily as needed for congestion.  Follow with Dr [name] in 12 months or sooner if you have any problems

## 2016-03-14 NOTE — Assessment & Plan Note (Signed)
We will refill fluticasone nasal sprau for prn. Continue loratadine .

## 2016-03-14 NOTE — Progress Notes (Signed)
Subjective:    Patient ID: [name], female    DOB: 12/07/1934, 82 y.o.   MRN: [id number]  HPI 82 yo woman, never smoker, hx sarcoidosis that was dx by Dr Lucia Gaskins in [place] after FOB (? Whether she had biopsies or not), PSVT, GERD. She also had a hx severe PNA with empyema and chest tube (90's). She has DOE and difficulty walking. The decrease in function has come on gradually. Her cardiac stress test was negative as below. PFT show obstruction with a DLCO defect.   PFT 12/06/13 >> severe AFL without BD response.   ROV 02/25/14 -- follow up for progressive dyspnea in setting sarcoidosis. Last time we decided to initiate short acting beta agonist to see if she would benefit. Walking oximetry was reassuring. She has been doing a bit better - she believes that she does better now that the hot weather is passed. She used SABA a few times and felt that it helped her some. She is asking about pneumonia vaccine.   ROV 09/18/14 -- follow-up visit for history of sarcoidosis. Her last visit we started Symbicort to see if she would benefit.  She used the symbicort about qd, started to forget it. She isn't sure that she felt better on it. She reports that she had an acute flare in June, followed a trip to [place]. She required treatment with pred and abx. She has albuterol that she uses prn, very rarely. She is otherwise well, no pulm limitations at baseline.   ROV1/26/17 -- patient has history of sarcoidosis, severe obstructive lung disease. Also a history of a prior empyema that required chest use drainage. She underwent a repeat CT scan of her chest in October 2016 that confirmed some areas of bronchiectasis and architectural distortion in the right middle lobe and elsewhere. There was no interstitial disease and very little change compared with 2014.   She stopped Symbicort ;last visit, has not really missed it. She is breathing well, but is having problems with nasal gtt, R ear fullness, a hacking cough. She  moved end of October - new exposures, new home.  Has not needed SABA.                 ROV 09/15/15 -- patient follows up for her history of severe obstructive lung disease in the setting of sarcoidosis. She's also had a history of empyema and chest tube drainage.  Also with a history of chronic rhinitis with associated cough. I last saw her in January 2017 and at that time we started a nasal steroid and loratadine. She took reliably for a while, but then tried stopping it to see what would happen.  She is having a lot of drainage and sneezing now.  Last CT chest stable in 11/2014.  She has SABA available to use but has not required it. She has some SOB during the hot months, otherwise no sx.  ROV 03/14/16 -- this is a follow-up visit for history of sarcoidosis, associated severe obstructive lung disease, hx of empyema and chest tube drainage in the past.    Also with a history of chronic cough in the setting of rhinitis. Her last CT scan of the chest was 12/02/14. She has some exertional SOB, can happen with shopping. She is not using Symbicort. She is on loratadine qd, flonase prn. She does not have an albuterol inhaler right now.                 Review of Systems  Constitutional: Negative for fever and unexpected weight change.  HENT: Negative for congestion, dental problem, ear pain, nosebleeds, postnasal drip, rhinorrhea, sinus pressure, sneezing, sore throat and trouble swallowing.   Eyes: Negative for redness and itching.  Respiratory: Negative for cough, chest tightness, shortness of breath and wheezing.   Cardiovascular: Negative for palpitations and leg swelling.  Gastrointestinal: Negative for nausea and vomiting.  Genitourinary: Negative for dysuria.  Musculoskeletal:  Negative for joint swelling.  Skin: Negative for rash.  Neurological: Negative for headaches.  Hematological: Does not bruise/bleed easily.  Psychiatric/Behavioral: Negative for dysphoric mood. The patient is not nervous/anxious.       Objective:   Physical Exam Vitals:   03/14/16 1022  BP: 122/60  Pulse: 62  SpO2: 98%  Weight: 131 lb 6.4 oz (59.6 kg)  Height: 5\' 3"  (1.6 m)   Gen: Pleasant, kyphotic elderly woman, in no distress,  normal affect  ENT: No lesions,  mouth clear,  oropharynx clear, no postnasal drip  Neck: No JVD, no TMG, no carotid bruits  Lungs: No use of accessory muscles, clear without rales or rhonchi  Cardiovascular: RRR, heart sounds normal, no murmur or gallops, no peripheral edema  Musculoskeletal: No deformities, no cyanosis or clubbing  Neuro: alert, non focal  Skin: Warm, no lesions or rashes   12/02/14 --  COMPARISON: Chest CT 11/28/2012.  FINDINGS: Mediastinum/Lymph Nodes: Heart size is normal. There is no significant pericardial fluid, thickening or pericardial calcification. There is atherosclerosis of the thoracic aorta, the great vessels of the mediastinum and the coronary arteries, including calcified atherosclerotic plaque in the left main, left anterior descending, left circumflex and right coronary arteries. No pathologically enlarged mediastinal or hilar lymph nodes. Numerous densely calcified mediastinal and right hilar lymph nodes are noted. Esophagus is unremarkable in appearance. No axillary lymphadenopathy.  Lungs/Pleura: Focal area of cystic bronchiectasis and architectural distortion in the medial segment of the right middle lobe. Some other scattered areas of mild cylindrical bronchiectasis are noted in the right lung. Chronic area of scarring in the posterior aspect of the right lower lobe is unchanged compared to the prior examination. High-resolution images demonstrate no additional areas of ground-glass  attenuation, subpleural reticulation, parenchymal banding or honeycombing to suggest interstitial lung disease. No acute consolidative airspace disease. No pleural effusions. No suspicious appearing pulmonary nodules or masses.  Upper abdomen: Incompletely visualized low-attenuation lesion measuring 1.6 cm in diameter adjacent to the falciform ligament is incompletely characterized, but is similar to remote prior study 05/10/2006 and statistically likely a cyst.  Musculoskeletal: Multiple old healed posterior right-sided rib fractures are noted. Old compression fracture of T11 with approximately 20% loss of anterior vertebral body height. There are no aggressive appearing lytic or blastic lesions noted in the visualized portions of the skeleton.  IMPRESSION: 1. No findings to suggest interstitial lung disease. 2. The overall appearance the chest is very similar to the prior study 11/28/2012.  Findings are nonspecific, but could be seen in the setting of reported sarcoidosis, as detailed above. 3. Areas of bronchiectasis are noted in the right lung, most severe in the medial segment of the right middle lobe where there are areas of cystic bronchiectasis and architectural distortion. 4. Atherosclerosis, including left main and 3 vessel coronary artery disease. Assessment for potential risk factor modification, dietary therapy or pharmacologic therapy may be warranted, if clinically indicated. 5. Additional incidental findings, as above   12/10/13 --  Myocardial perfusion scan >> normal perfusion       Assessment & Plan:  SARCOIDOSIS, PULMONARY Overall she appears to be clinically stable. She does still have some exertional dyspnea. I will repeat her CT scan of the chest to compare with October 2016. If this is stable we may be able to convert to following chest x-rays. She has not been using Symbicort or any bronchodilator with irregularity. I will stop the Symbicort, have her  use albuterol when necessary.  Allergic rhinitis We will refill fluticasone nasal sprau for prn. Continue loratadine .   Baltazar Apo, MD, PhD 03/14/2016, 10:51 AM [facility] [phone] or if no answer [phone]

## 2016-03-15 ENCOUNTER — Ambulatory Visit: Payer: Medicare Other | Admitting: Physical Therapy

## 2016-03-18 ENCOUNTER — Ambulatory Visit: Payer: Medicare Other | Admitting: Physical Therapy

## 2016-03-22 ENCOUNTER — Ambulatory Visit: Payer: Medicare Other | Admitting: Physical Therapy

## 2016-03-22 ENCOUNTER — Ambulatory Visit (INDEPENDENT_AMBULATORY_CARE_PROVIDER_SITE_OTHER)
Admission: RE | Admit: 2016-03-22 | Discharge: 2016-03-22 | Disposition: A | Discharge status: Home / Self Care | Payer: Medicare Other | Source: Ambulatory Visit | Attending: Emergency Medicine | Admitting: Emergency Medicine

## 2016-03-22 DIAGNOSIS — D869 Sarcoidosis, unspecified: Secondary | ICD-10-CM | POA: Diagnosis not present

## 2016-03-25 ENCOUNTER — Ambulatory Visit: Payer: Medicare Other | Admitting: Physical Therapy

## 2016-03-29 ENCOUNTER — Ambulatory Visit: Payer: Medicare Other | Admitting: Physical Therapy

## 2016-04-01 ENCOUNTER — Ambulatory Visit: Payer: Medicare Other | Admitting: Physical Therapy

## 2016-04-08 DIAGNOSIS — L209 Atopic dermatitis, unspecified: Secondary | ICD-10-CM | POA: Insufficient documentation

## 2016-04-14 ENCOUNTER — Telehealth: Payer: Self-pay | Admitting: Physician Assistant

## 2016-04-14 NOTE — Telephone Encounter (Signed)
Patient calling complaining of pain and swelling in her left ankle that goes up the back of her leg that has been going on for about a week. She denies any swelling or pain in her right leg.  The patient is also experiencing intermittent chest pain at rest and left arm pain for the past 2 days. The patient states that it feels like an aching pain and that it lasts a few minutes and then goes away on its own. The patient has not taken any NTG. The patient denies any SOB, N/V, sweating, or any other symptoms. The patient does not have a way to check her BP at home.  No appointments available today or tomorrow at [address] office. Reviewed with Dr. Meda Coffee (DOD) and [name], PA. Appointment made with Bernerd Pho, PA tomorrow at 10:00 am at the [facility] office.  Patient was notified of appointment and was advised to take her NTG if her chest pain persists or go to the ER if her symptoms worsen. Patient verbalized understanding and is in agreement with this plan.

## 2016-04-14 NOTE — Telephone Encounter (Signed)
New Message     Pt c/o swelling: STAT is pt has developed SOB within 24 hours  1. How long have you been experiencing swelling? Last week   2. Where is the swelling located? In her leg and foot  3.  Are you currently taking a "fluid pill" not on fluid pill  4.  Are you currently SOB? No   5.  Have you traveled recently? no

## 2016-04-15 ENCOUNTER — Encounter: Payer: Self-pay | Admitting: Student

## 2016-04-15 ENCOUNTER — Telehealth: Payer: Self-pay | Admitting: *Deleted

## 2016-04-15 ENCOUNTER — Ambulatory Visit (INDEPENDENT_AMBULATORY_CARE_PROVIDER_SITE_OTHER): Payer: Medicare Other | Admitting: Student

## 2016-04-15 VITALS — BP 124/67 | HR 64 | Ht 63.0 in | Wt 134.2 lb

## 2016-04-15 DIAGNOSIS — R0789 Other chest pain: Secondary | ICD-10-CM | POA: Diagnosis not present

## 2016-04-15 DIAGNOSIS — E785 Hyperlipidemia, unspecified: Secondary | ICD-10-CM

## 2016-04-15 DIAGNOSIS — R600 Localized edema: Secondary | ICD-10-CM | POA: Diagnosis not present

## 2016-04-15 DIAGNOSIS — I2510 Atherosclerotic heart disease of native coronary artery without angina pectoris: Secondary | ICD-10-CM | POA: Diagnosis not present

## 2016-04-15 DIAGNOSIS — M79662 Pain in left lower leg: Secondary | ICD-10-CM | POA: Diagnosis not present

## 2016-04-15 DIAGNOSIS — I471 Supraventricular tachycardia: Secondary | ICD-10-CM | POA: Diagnosis not present

## 2016-04-15 DIAGNOSIS — R7989 Other specified abnormal findings of blood chemistry: Secondary | ICD-10-CM

## 2016-04-15 LAB — D-DIMER, QUANTITATIVE: D-Dimer, Quant: 1.1 mcg/mL FEU — ABNORMAL HIGH (ref <0.50)

## 2016-04-15 NOTE — Patient Instructions (Signed)
Medication Instructions:  Your physician recommends that you continue on your current medications as directed. Please refer to the Current Medication list given to you today.  Labwork: Please have lab work TODAY ([place])  Testing/Procedures: NONE  Follow-Up: Your physician recommends that you schedule a follow-up appointment in: April with Dr. Burt Knack at [facility].   Any Other Special Instructions Will Be Listed Below (If Applicable).     If you need a refill on your cardiac medications before your next appointment, please call your pharmacy.

## 2016-04-15 NOTE — Progress Notes (Signed)
Cardiology Office Note    Date:  04/15/2016   ID:  [name], DOB 04-17-1934, MRN [id number]  PCP:  [name], MD  Cardiologist: Previously Dr. Aundra Dubin --> Now Dr. Burt Knack   Chief Complaint  Patient presents with  . Follow-up    chest pain, lower extremity edema.     History of Present Illness:    [name] is a 82 y.o. female with past medical history of CAD (cath in 04/2015 showing 60-70% ostial LCx stenosis which was not hemodynamically significant by FFR), pulmonary sarcoidosis, HTN, HLD, and pSVT who presents to the office today for evaluation of chest discomfort and lower extremity edema.   Last seen by [name], PA-C in 11/2015, denying any recent chest discomfort, orthopnea, PND, or lower extremity edema. Had chronic dyspnea with exertion.   In talking with the patient today, she reports being diagnosed with Shingles approximately 3 weeks ago. She was started on Gabapentin at that time. Reports improvement in her symptoms, but still having pain along her right breast.   Starting 2 days ago, she experienced some right-sided chest discomfort close to where her initial breakout occurred. Says this felt like a burning sensation and lasted for over 30 minutes. Her symptoms have since improved. Denies any associated dyspnea, palpitations, nausea, vomiting, or diaphoresis.   She also reports having pain along her left calf and lower extremity edema starting two weeks ago. She does not regularly elevate her legs. Has compression stockings but rarely wears them. Denies any recent or prolonged travel. Does have a history of DVT occurring 20+ years prior.   Past Medical History:  Diagnosis Date  . Allergy    SEASONAL  . Arthritis   . CAD (coronary artery disease)    a. Myoview 10/15 - normal EF 70% // Myoview 11/16: EF 75%, normal perfusion, (there was no blood pressure drop at low level exercise with Lexiscan infusion), low risk study // c. LHC 3/17 - LAD  irregs, oLCx 70 (neg FFR), mRCA 30, EF 55-65% >> med Rx  . Carotid stenosis    a. Carotid US 9/15 - Bilateral 1-39% ICA >> FU 2 years // b. Bilateral ICA 1-39 >> FU prn  . Esophageal reflux   . Hematochezia   . History of echocardiogram    a. Echo 10/15 - EF 60-65%, no RWMA  . HLD (hyperlipidemia)   . Hx of colonoscopy   . Osteoporosis   . Other chronic pulmonary heart diseases   . Paroxysmal supraventricular tachycardia ([place])   . Sarcoidosis [facility])     Past Surgical History:  Procedure Laterality Date  . arm surgery Right   . BLADDER SURGERY    . CARDIAC CATHETERIZATION N/A 05/20/2015   Procedure: Left Heart Cath and Coronary Angiography;  Surgeon: Sherren Mocha, MD;  Location: [facility];  Service: Cardiovascular;  Laterality: N/A;  . CARPAL TUNNEL RELEASE Left   . COLONOSCOPY    . FOOT SURGERY Right   . HIP SURGERY Right 2011   full replacement  . LEG SURGERY Left May 2014   femur fracture s/p open and closed reduction in [place], Dr. Jimmye Norman  . POLYPECTOMY    . SHOULDER ARTHROSCOPY W/ ROTATOR CUFF REPAIR Right   . TRAPEZIUM RESECTION Right     Current Medications: Outpatient Medications Prior to Visit  Medication Sig Dispense Refill  . albuterol (PROVENTIL HFA;VENTOLIN HFA) 108 (90 Base) MCG/ACT inhaler Inhale 2 puffs into the lungs every 4 (four) hours as  needed for wheezing or shortness of breath. 1 Inhaler 6  . aspirin EC 81 MG tablet Take 1 tablet (81 mg total) by mouth daily.    . calcium carbonate (TUMS - DOSED IN MG ELEMENTAL CALCIUM) 500 MG chewable tablet Chew 1 tablet by mouth daily. Pt isn't sure of dose.    . diltiazem (CARDIZEM CD) 180 MG 24 hr capsule Take 1 capsule (180 mg total) by mouth daily. 90 capsule 3  . fluticasone (FLONASE) 50 MCG/ACT nasal spray Place 2 sprays into both nostrils daily. 16 g 5  . loratadine (CLARITIN) 10 MG tablet Take 1 tablet (10 mg total) by mouth daily. 30 tablet 11  . MYRBETRIQ 50 MG TB24 tablet Take 50 mg by mouth  daily.     . nitroGLYCERIN (NITROSTAT) 0.4 MG SL tablet Place 1 tablet (0.4 mg total) under the tongue every 5 (five) minutes as needed for chest pain. 25 tablet 3  . omeprazole (PRILOSEC) 40 MG capsule Take 40 mg by mouth daily.     . rosuvastatin (CRESTOR) 10 MG tablet Take 1 tablet by mouth two times per week only 24 tablet 3  . traMADol (ULTRAM) 50 MG tablet Take 50 mg by mouth every 6 (six) hours as needed for severe pain.     . Vitamin D, Ergocalciferol, (DRISDOL) 50000 UNITS CAPS Take 50,000 Units by mouth every 7 (seven) days.      No facility-administered medications prior to visit.      Allergies:   Morphine; Oxycodone-acetaminophen; Percocet [oxycodone-acetaminophen]; and Sulfonamide derivatives   Social History   Social History  . Marital status: Widowed    Spouse name: N/A  . Number of children: 1  . Years of education: N/A   Occupational History  . retired     still works as Oceanographer  .  Retired   Social History Main Topics  . Smoking status: Never Smoker  . Smokeless tobacco: Never Used  . Alcohol use No  . Drug use: No  . Sexual activity: No   Other Topics Concern  . None   Social History Narrative   Lives with daughter and son in law   Caffeine use:  Coffee 1 per day     Family History:  The patient's family history includes Arrhythmia in her father; Cancer in her father and mother; Colon cancer (age of onset: 66) in her mother; Heart attack in her brother; Heart disease in her father; Lung cancer in her father.   Review of Systems:   Please see the history of present illness.     General:  No chills, fever, night sweats or weight changes.  Cardiovascular:  No dyspnea on exertion, orthopnea, palpitations, paroxysmal nocturnal dyspnea. Positive for chest pain and lower extremity edema.  Dermatological: No rash, lesions/masses Respiratory: No cough, dyspnea Urologic: No hematuria, dysuria Abdominal:   No nausea, vomiting, diarrhea, bright red  blood per rectum, melena, or hematemesis Neurologic:  No visual changes, wkns, changes in mental status. All other systems reviewed and are otherwise negative except as noted above.   Physical Exam:    VS:  BP 124/67   Pulse 64   Ht 5\' 3"  (1.6 m)   Wt 134 lb 3.2 oz (60.9 kg)   LMP 02/22/1983 (Approximate)   BMI 23.77 kg/m    General: Well developed, elderly Caucasian female appearing in no acute distress. Head: Normocephalic, atraumatic, sclera non-icteric, no xanthomas, nares are without discharge.  Neck: No carotid bruits. JVD not elevated.  Lungs: Respirations regular and unlabored, without wheezes or rales.  Heart: Regular rate and rhythm. No S3 or S4.  No murmur, no rubs, or gallops appreciated. Abdomen: Soft, non-tender, non-distended with normoactive bowel sounds. No hepatomegaly. No rebound/guarding. No obvious abdominal masses. Msk:  Strength and tone appear normal for age. No joint deformities or effusions. Extremities: No clubbing or cyanosis. Trace lower extremity edema along left ankle. Tender to palpation along left calf.  Distal pedal pulses are 2+ bilaterally. Neuro: Alert and oriented X 3. Moves all extremities spontaneously. No focal deficits noted. Psych:  Responds to questions appropriately with a normal affect. Skin: No rashes or lesions noted  Wt Readings from Last 3 Encounters:  04/15/16 134 lb 3.2 oz (60.9 kg)  03/14/16 131 lb 6.4 oz (59.6 kg)  02/24/16 131 lb (59.4 kg)    Studies/Labs Reviewed:   EKG:  EKG is ordered today.  The EKG ordered today demonstrates NSR, HR 64, with no acute ST or T-wave changes when   Recent Labs: 05/18/2015: BUN 23; Creat 0.85; Hemoglobin 13.2; Platelets 234; Potassium 4.4; Sodium 143 11/03/2015: ALT 11 01/08/2016: TSH 1.620   Lipid Panel    Component Value Date/Time   CHOL 156 11/03/2015 0742   TRIG 70 11/03/2015 0742   HDL 79 11/03/2015 0742   CHOLHDL 2.0 11/03/2015 0742   VLDL 14 11/03/2015 0742   LDLCALC 63  11/03/2015 0742    Additional studies/ records that were reviewed today include:   Cardiac Catheterization:05/20/2015  The left ventricular systolic function is normal.  Mid RCA lesion, 30% stenosed.  Mid Cx lesion, 40% stenosed.  Ost Cx lesion, 70% stenosed.   1. Moderate single-vessel coronary artery disease involving the ostium of the left circumflex. Interrogated with FFR which is negative.  2. Widely patent LAD and RCA.  3. Normal LV function.  Recommendations: Medical therapy for nonobstructive CAD.  Assessment:    1. Pain of left calf   2. Leg edema   3. Atypical chest pain   4. Coronary artery disease involving native coronary artery of native heart without angina pectoris   5. Hyperlipidemia, unspecified hyperlipidemia type   6. Paroxysmal supraventricular tachycardia ([place])      Plan:   In order of problems listed above:  1. Left Calf Pain/ Lower Extremity Edema - developed pain along her left calf starting two weeks prior. No associated dyspnea with exertion, orthopnea or PND. No recent travel. Will check a d-dimer to rule out DVT, as she did have this 20+ years prior.  - also mentions swelling along her left ankle. Edema is minimal by examination. Recommended she elevate her legs and consider the use of compression stockings. LVEF was normal by cath in 2017. Discussed use of PRN Lasix for lower extremity edema but she prefers to try elevation and compression stockings initially which is certainly reasonable considering swelling is minimal.   2. Atypical Chest Pain - diagnosed with Shingles approximately 3 weeks ago with breakout occurring along her right breast. Started on Gabapentin by PCP but still experiencing significant neuropathy. The chest discomfort she is describing is along the same dermatone as her breakout, likely secondary to neuropathy.  - describes the pain as a burning sensation and lasting for over 30 minutes at a time. No associated symptoms  and no association with exertion.  - EKG today is without acute ischemic changes. Overall etiology of her pain sounds noncardiac and most consistent with neuropathy in the setting of known Shingles.  3. CAD -  cath in 04/2015 showed 70% Ost Cx stenosis, 40% Mid Cx stenosis, and 30% mid-RCA stenosis. Ost Cx lesion not hemodynamically significant by FFR.  - continue ASA and statin therapy.   4. HLD - Lipid Panel in 10/2015 showed total cholesterol 156, HDL 79, and LDL 63. At goal, with LDL < 70. - continue Crestor 10mg  twice weekly.   5. pSVT - denies any recent palpitations. Maintaining NSR on EKG. - continue Cardizem CD 180mg  daily.    Medication Adjustments/Labs and Tests Ordered: Current medicines are reviewed at length with the patient today.  Concerns regarding medicines are outlined above.  Medication changes, Labs and Tests ordered today are listed in the Patient Instructions below. Patient Instructions  Medication Instructions:  Your physician recommends that you continue on your current medications as directed. Please refer to the Current Medication list given to you today.  Labwork: Please have lab work TODAY (D-dimer)  Testing/Procedures: NONE  Follow-Up: Your physician recommends that you schedule a follow-up appointment in: April with Dr. Burt Knack at [facility].   Any Other Special Instructions Will Be Listed Below (If Applicable).  If you need a refill on your cardiac medications before your next appointment, please call your pharmacy.   Signed, Erma Heritage, PA  04/15/2016 2:02 PM    [facility] [address], [place], [place]  [address] Phone: [phone]; Fax: [phone]  [address] [place], [place] [address] Phone: [phone]

## 2016-04-15 NOTE — Telephone Encounter (Signed)
Test orders entered after discussed w [place].  I have called patient and communicated recommendation for LE [place]. She's aware we have made an appt for her at [facility] on Wednesday. I have also advised patient that should she have any sudden new discomfort, pain travels from leg, or if she has shortness of breath, she will need to go to the emergency room. Pt expressed understanding and thanks.

## 2016-04-15 NOTE — Telephone Encounter (Signed)
-----   Message from Erma Heritage, [place] sent at 04/15/2016  4:21 PM EST ----- Please let patient know her d-dimer is slightly elevated. We need to make sure she does not have a blood clot in her leg. She will need a lower extremity doppler study next week (preferably Monday). Thank you!

## 2016-04-18 ENCOUNTER — Telehealth: Payer: Self-pay | Admitting: *Deleted

## 2016-04-18 NOTE — Telephone Encounter (Signed)
Call to patient as requested by Kem Boroughs, NP to f/u with breast pain and possible rash noted on 04/07/16 [facility]. Patient states she was told by pcp that she was experiencing symptoms of shingles. Patient states she never had a rash breakout at all, just the pain. Patient states she was prescribed gabapentin and been taking tramadol, that is helping with pain. Patient states she has a study scheduled for this Wednesday to check to see if there is a blood clot in left leg. Patient thankful for call. Advised patient would update Kem Boroughs, NP and return call with any additional recommendations. Patient is agreeable.  Kem Boroughs, NP -any additional recommendations?

## 2016-04-18 NOTE — Telephone Encounter (Signed)
That is exactly what I thought of when the report said rash and the pain she was having.  I am glad she is seeing PCP and getting evaluation done.

## 2016-04-19 ENCOUNTER — Encounter (HOSPITAL_COMMUNITY): Payer: Self-pay

## 2016-04-19 ENCOUNTER — Ambulatory Visit (HOSPITAL_COMMUNITY)
Admission: RE | Admit: 2016-04-19 | Discharge: 2016-04-19 | Disposition: A | Discharge status: Home / Self Care | Payer: Medicare Other | Source: Ambulatory Visit | Attending: Internal Medicine | Admitting: Internal Medicine

## 2016-04-19 DIAGNOSIS — M810 Age-related osteoporosis without current pathological fracture: Secondary | ICD-10-CM | POA: Insufficient documentation

## 2016-04-19 MED ORDER — DENOSUMAB 60 MG/ML ~~LOC~~ SOLN
60.0000 mg | Freq: Once | SUBCUTANEOUS | Status: AC
  Administered 2016-04-19: 60 mg via SUBCUTANEOUS
  Filled 2016-04-19: qty 1

## 2016-04-19 NOTE — Progress Notes (Signed)
Prolia 60mg  SQ to lt outer upper arm given.  D/c instructions given to pt on Prolia.  Pt takes daily calcium and vitamin D, informed to keep taking unless her MD instructs otherwise.  Pt voiced understanding.  Next appointment for August given to pt along with short stay phone number.  Pt d/c ambulatory to lobby.

## 2016-04-19 NOTE — Discharge Instructions (Signed)
Denosumab injection °What is this medicine? °DENOSUMAB (den oh sue mab) slows bone breakdown. Prolia is used to treat osteoporosis in women after menopause and in men. Xgeva is used to treat a high calcium level due to cancer and to prevent bone fractures and other bone problems caused by multiple myeloma or cancer bone metastases. Xgeva is also used to treat giant cell tumor of the bone. °This medicine may be used for other purposes; ask your health care provider or pharmacist if you have questions. °COMMON BRAND NAME(S): Prolia, XGEVA °What should I tell my health care provider before I take this medicine? °They need to know if you have any of these conditions: °-dental disease °-having surgery or tooth extraction °-infection °-kidney disease °-low levels of calcium or Vitamin D in the blood °-malnutrition °-on hemodialysis °-skin conditions or sensitivity °-thyroid or parathyroid disease °-an unusual reaction to denosumab, other medicines, foods, dyes, or preservatives °-pregnant or trying to get pregnant °-breast-feeding °How should I use this medicine? °This medicine is for injection under the skin. It is given by a health care professional in a hospital or clinic setting. °If you are getting Prolia, a special MedGuide will be given to you by the pharmacist with each prescription and refill. Be sure to read this information carefully each time. °For Prolia, talk to your pediatrician regarding the use of this medicine in children. Special care may be needed. For Xgeva, talk to your pediatrician regarding the use of this medicine in children. While this drug may be prescribed for children as young as 13 years for selected conditions, precautions do apply. °Overdosage: If you think you have taken too much of this medicine contact a poison control center or emergency room at once. °NOTE: This medicine is only for you. Do not share this medicine with others. °What if I miss a dose? °It is important not to miss your  dose. Call your doctor or health care professional if you are unable to keep an appointment. °What may interact with this medicine? °Do not take this medicine with any of the following medications: °-other medicines containing denosumab °This medicine may also interact with the following medications: °-medicines that lower your chance of fighting infection °-steroid medicines like prednisone or cortisone °This list may not describe all possible interactions. Give your health care provider a list of all the medicines, herbs, non-prescription drugs, or dietary supplements you use. Also tell them if you smoke, drink alcohol, or use illegal drugs. Some items may interact with your medicine. °What should I watch for while using this medicine? °Visit your doctor or health care professional for regular checks on your progress. Your doctor or health care professional may order blood tests and other tests to see how you are doing. °Call your doctor or health care professional for advice if you get a fever, chills or sore throat, or other symptoms of a cold or flu. Do not treat yourself. This drug may decrease your body's ability to fight infection. Try to avoid being around people who are sick. °You should make sure you get enough calcium and vitamin D while you are taking this medicine, unless your doctor tells you not to. Discuss the foods you eat and the vitamins you take with your health care professional. °See your dentist regularly. Brush and floss your teeth as directed. Before you have any dental work done, tell your dentist you are receiving this medicine. °Do not become pregnant while taking this medicine or for 5 months after stopping   it. Talk with your doctor or health care professional about your birth control options while taking this medicine. Women should inform their doctor if they wish to become pregnant or think they might be pregnant. There is a potential for serious side effects to an unborn child. Talk  to your health care professional or pharmacist for more information. What side effects may I notice from receiving this medicine? Side effects that you should report to your doctor or health care professional as soon as possible: -allergic reactions like skin rash, itching or hives, swelling of the face, lips, or tongue -bone pain -breathing problems -dizziness -jaw pain, especially after dental work -redness, blistering, peeling of the skin -signs and symptoms of infection like fever or chills; cough; sore throat; pain or trouble passing urine -signs of low calcium like fast heartbeat, muscle cramps or muscle pain; pain, tingling, numbness in the hands or feet; seizures -unusual bleeding or bruising -unusually weak or tired Side effects that usually do not require medical attention (report to your doctor or health care professional if they continue or are bothersome): -constipation -diarrhea -headache -joint pain -loss of appetite -muscle pain -runny nose -tiredness -upset stomach This list may not describe all possible side effects. Call your doctor for medical advice about side effects. You may report side effects to FDA at 1-800-FDA-1088. Where should I keep my medicine? This medicine is only given in a clinic, doctor's office, or other health care setting and will not be stored at home. NOTE: This sheet is a summary. It may not cover all possible information. If you have questions about this medicine, talk to your doctor, pharmacist, or health care provider.  2018 Elsevier/Gold Standard (2016-03-01 19:17:21)

## 2016-04-20 ENCOUNTER — Ambulatory Visit (HOSPITAL_COMMUNITY)
Admission: RE | Admit: 2016-04-20 | Discharge: 2016-04-20 | Disposition: A | Discharge status: Home / Self Care | Payer: Medicare Other | Source: Ambulatory Visit | Attending: Cardiovascular Disease | Admitting: Cardiovascular Disease

## 2016-04-20 DIAGNOSIS — I2510 Atherosclerotic heart disease of native coronary artery without angina pectoris: Secondary | ICD-10-CM | POA: Insufficient documentation

## 2016-04-20 DIAGNOSIS — J449 Chronic obstructive pulmonary disease, unspecified: Secondary | ICD-10-CM | POA: Insufficient documentation

## 2016-04-20 DIAGNOSIS — R7989 Other specified abnormal findings of blood chemistry: Secondary | ICD-10-CM | POA: Diagnosis not present

## 2016-04-20 DIAGNOSIS — R2240 Localized swelling, mass and lump, unspecified lower limb: Secondary | ICD-10-CM | POA: Insufficient documentation

## 2016-04-20 DIAGNOSIS — I10 Essential (primary) hypertension: Secondary | ICD-10-CM | POA: Diagnosis not present

## 2016-04-20 DIAGNOSIS — E785 Hyperlipidemia, unspecified: Secondary | ICD-10-CM | POA: Diagnosis not present

## 2016-04-21 ENCOUNTER — Encounter: Payer: Self-pay | Admitting: Nurse Practitioner

## 2016-04-25 ENCOUNTER — Encounter: Payer: Self-pay | Admitting: Pulmonary Disease

## 2016-04-25 ENCOUNTER — Ambulatory Visit (INDEPENDENT_AMBULATORY_CARE_PROVIDER_SITE_OTHER): Payer: Medicare Other | Admitting: Pulmonary Disease

## 2016-04-25 DIAGNOSIS — D869 Sarcoidosis, unspecified: Secondary | ICD-10-CM

## 2016-04-25 DIAGNOSIS — J471 Bronchiectasis with (acute) exacerbation: Secondary | ICD-10-CM

## 2016-04-25 DIAGNOSIS — I2510 Atherosclerotic heart disease of native coronary artery without angina pectoris: Secondary | ICD-10-CM

## 2016-04-25 DIAGNOSIS — J479 Bronchiectasis, uncomplicated: Secondary | ICD-10-CM | POA: Insufficient documentation

## 2016-04-25 MED ORDER — LEVOFLOXACIN 500 MG PO TABS: 500.0000 mg | ORAL_TABLET | Freq: Every day | ORAL | 0 refills | Status: DC

## 2016-04-25 MED ORDER — PREDNISONE 10 MG PO TABS: ORAL_TABLET | ORAL | 0 refills | Status: DC

## 2016-04-25 MED ORDER — METHYLPREDNISOLONE ACETATE 80 MG/ML IJ SUSP
80.0000 mg | Freq: Once | INTRAMUSCULAR | Status: AC
  Administered 2016-04-25: 80 mg via INTRAMUSCULAR

## 2016-04-25 NOTE — Assessment & Plan Note (Signed)
Solu-Medrol 80 mg IM 1 Prednisone 10 mg tablets -Take 4 tabs  daily with food x 4 days, then 3 tabs daily x 4 days, then 2 tabs daily x 4 days, then 1 tab daily x4 days then stop. #40   Levaquin 500 mg daily for 7 days

## 2016-04-25 NOTE — Patient Instructions (Signed)
Solu-Medrol 80 mg IM 1 Prednisone 10 mg tablets -Take 4 tabs  daily with food x 4 days, then 3 tabs daily x 4 days, then 2 tabs daily x 4 days, then 1 tab daily x4 days then stop. #40   Levaquin 500 mg daily for 7 days

## 2016-04-25 NOTE — Progress Notes (Signed)
   Subjective:    Patient ID: CAMRYNN MACBRIDE, female    DOB: [birth year], 82 y.o.   MRN: [id number]  HPI  82 yo woman, never smoker, hx sarcoidosis that was dx by Dr Lucia Gaskins in [place] after FOB (? Whether she had biopsies or not), PSVT, GERD. She also had a hx severe PNA with empyema and chest tube (90's).   Chief Complaint  Patient presents with  . Acute Visit    RB's pt. Increased wheezing and coughing. Productive cough with yellow mucus. Started on Saturday. Pt. states she feels terrible.    She reports 3 days of coughing productive of yellow sputum now, started off as a runny nose with sore throat and she now feels that her throat is closing up She reports increased wheezing and dyspnea No fevers, no sick contacts  PFT 12/06/13 >> severe AFL without BD response, low DLCO  HRCT 02/2016 >> RLL tree in bud mild opacity, stable bronchiectasis, I personally reviewed these images with the patient   Past Medical History:  Diagnosis Date  . Allergy    SEASONAL  . Arthritis   . CAD (coronary artery disease)    a. Myoview 10/15 - normal EF 70% // Myoview 11/16: EF 75%, normal perfusion, (there was no blood pressure drop at low level exercise with Lexiscan infusion), low risk study // c. LHC 3/17 - LAD irregs, oLCx 70 (neg FFR), mRCA 30, EF 55-65% >> med Rx  . Carotid stenosis    a. Carotid US 9/15 - Bilateral 1-39% ICA >> FU 2 years // b. Bilateral ICA 1-39 >> FU prn  . Esophageal reflux   . Hematochezia   . History of echocardiogram    a. Echo 10/15 - EF 60-65%, no RWMA  . HLD (hyperlipidemia)   . Hx of colonoscopy   . Osteoporosis   . Other chronic pulmonary heart diseases   . Paroxysmal supraventricular tachycardia ([place])   . Sarcoidosis ([place])      Review of Systems  neg for any significant sore throat, dysphagia, itching, sneezing, nasal congestion or excess/ purulent secretions, fever, chills, sweats, unintended wt loss, pleuritic or exertional cp, hempoptysis, orthopnea pnd or  change in chronic leg swelling. Also denies presyncope, palpitations, heartburn, abdominal pain, nausea, vomiting, diarrhea or change in bowel or urinary habits, dysuria,hematuria, rash, arthralgias, visual complaints, headache, numbness weakness or ataxia.     Objective:   Physical Exam   Gen. Pleasant, well-nourished, in no distress, normal affect ENT - no lesions, no post nasal drip Neck: No JVD, no thyromegaly, no carotid bruits Lungs: no use of accessory muscles, no dullness to percussion, BL diffuse rhonchi  Cardiovascular: Rhythm regular, heart sounds  normal, no murmurs or gallops, no peripheral edema Abdomen: soft and non-tender, no hepatosplenomegaly, BS normal. Musculoskeletal: No deformities, no cyanosis or clubbing Neuro:  alert, non focal        Assessment & Plan:

## 2016-04-25 NOTE — Assessment & Plan Note (Signed)
CT shows faint tree in bud opacity right lower lobe which is new-this may be related to sarcoidosis or less likely MAC infection-she does not seem to have any other symptomatology and reasonable to observe for now

## 2016-04-27 ENCOUNTER — Telehealth: Payer: Self-pay | Admitting: Pulmonary Disease

## 2016-04-27 NOTE — Telephone Encounter (Signed)
Spoke with pt, who states during her OV with RA on 04-25-16 she was prescribed a prednisone taper. Pt states she is having trouble sleeping and is having a hard time relaxing. Pt states she would like to start tapering off of this medication tomorrow due to these side effects.  RA please advise. Thanks.

## 2016-04-27 NOTE — Telephone Encounter (Signed)
Message will be routed to DOD as RA is 11pm Elink.

## 2016-04-27 NOTE — Telephone Encounter (Signed)
OK to taper off

## 2016-04-27 NOTE — Telephone Encounter (Signed)
Spoke with pt,aware of recs.  Nothing further needed.  

## 2016-05-26 ENCOUNTER — Encounter: Payer: Self-pay | Admitting: Cardiovascular Disease

## 2016-06-08 ENCOUNTER — Encounter: Payer: Self-pay | Admitting: Podiatry

## 2016-06-08 ENCOUNTER — Ambulatory Visit (INDEPENDENT_AMBULATORY_CARE_PROVIDER_SITE_OTHER): Payer: Medicare Other | Admitting: Podiatry

## 2016-06-08 DIAGNOSIS — I2510 Atherosclerotic heart disease of native coronary artery without angina pectoris: Secondary | ICD-10-CM

## 2016-06-08 DIAGNOSIS — L84 Corns and callosities: Secondary | ICD-10-CM | POA: Diagnosis not present

## 2016-06-08 DIAGNOSIS — M2042 Other hammer toe(s) (acquired), left foot: Secondary | ICD-10-CM | POA: Diagnosis not present

## 2016-06-08 NOTE — Progress Notes (Signed)
Subjective:     Patient ID: [name], female   DOB: 04-19-1934, 82 y.o.   MRN: [id number]  HPI patient presents with fluid buildup around the second metatarsal left with lesion formation and distal deformity of the second digit left with keratotic lesion formation   Review of Systems     Objective:   Physical Exam Neurovascular status intact negative Homans sign was noted with patient found to have hammertoe deformity second left with distal keratotic lesion and inflammation around the second metatarsal with lesion that's painful    Assessment:     Lesion formation bilateral with digital deformity second left that's moderately painful    Plan:     H&P discussed hammertoe and considerations for distal arthroplasty one point future with education rendered and debrided lesions today and applied salicylic acid to try to soften

## 2016-06-10 ENCOUNTER — Encounter: Payer: Self-pay | Admitting: Cardiovascular Disease

## 2016-06-10 ENCOUNTER — Ambulatory Visit (INDEPENDENT_AMBULATORY_CARE_PROVIDER_SITE_OTHER): Payer: Medicare Other | Admitting: Cardiovascular Disease

## 2016-06-10 VITALS — BP 128/48 | HR 72 | Ht 63.0 in | Wt 128.2 lb

## 2016-06-10 DIAGNOSIS — E785 Hyperlipidemia, unspecified: Secondary | ICD-10-CM | POA: Diagnosis not present

## 2016-06-10 DIAGNOSIS — I2510 Atherosclerotic heart disease of native coronary artery without angina pectoris: Secondary | ICD-10-CM

## 2016-06-10 NOTE — Patient Instructions (Signed)
Medication Instructions:  Your physician recommends that you continue on your current medications as directed. Please refer to the Current Medication list given to you today.  Labwork: No new orders.  Testing/Procedures: No new orders.   Follow-Up: Your physician recommends that you schedule a follow-up appointment in: 6 MONTHS with [name] PA-C  Your physician wants you to follow-up in: 1 YEAR with Dr Burt Knack.  You will receive a reminder letter in the mail two months in advance. If you don't receive a letter, please call our office to schedule the follow-up appointment.   Any Other Special Instructions Will Be Listed Below (If Applicable).     If you need a refill on your cardiac medications before your next appointment, please call your pharmacy.

## 2016-06-10 NOTE — Progress Notes (Signed)
Cardiology Office Note Date:  06/10/2016   ID:  [name], DOB 03-26-1934, MRN [id number]  PCP:  [name], MD  Cardiologist:  Sherren Mocha, MD    Chief Complaint  Patient presents with  . Follow-up     History of Present Illness: [name] is a 82 y.o. female who presents for follow-up of coronary artery disease.  The patient is here alone today. She is doing fairly well. Denies chest pain, chest pressure, edema, or heart palpitations. She does have chronic shortness of breath with activity. The patient has a long history of pulmonary disease with pulmonary sarcoidosis and bronchiectasis.  In 2017 she experienced bilateral arm pain with exertion, symptoms worrisome for exertional angina. She underwent cardiac catheterization demonstrating moderate ostial stenosis of the left circumflex. FFR was performed and this was negative. Medical therapy was recommended. She otherwise had mild nonobstructive coronary disease involving the left main, LAD, and RCA.  Past Medical History:  Diagnosis Date  . Allergy    SEASONAL  . Arthritis   . CAD (coronary artery disease)    a. Myoview 10/15 - normal EF 70% // Myoview 11/16: EF 75%, normal perfusion, (there was no blood pressure drop at low level exercise with Lexiscan infusion), low risk study // c. LHC 3/17 - LAD irregs, oLCx 70 (neg FFR), mRCA 30, EF 55-65% >> med Rx  . Carotid stenosis    a. Carotid US 9/15 - Bilateral 1-39% ICA >> FU 2 years // b. Bilateral ICA 1-39 >> FU prn  . Esophageal reflux   . Hematochezia   . History of echocardiogram    a. Echo 10/15 - EF 60-65%, no RWMA  . HLD (hyperlipidemia)   . Hx of colonoscopy   . Osteoporosis   . Other chronic pulmonary heart diseases   . Paroxysmal supraventricular tachycardia ([place])   . Sarcoidosis     Past Surgical History:  Procedure Laterality Date  . arm surgery Right   . BLADDER SURGERY    . CARDIAC CATHETERIZATION N/A 05/20/2015   Procedure: Left  Heart Cath and Coronary Angiography;  Surgeon: Sherren Mocha, MD;  Location: [facility];  Service: Cardiovascular;  Laterality: N/A;  . CARPAL TUNNEL RELEASE Left   . COLONOSCOPY    . FOOT SURGERY Right   . HIP SURGERY Right 2011   full replacement  . LEG SURGERY Left May 2014   femur fracture s/p open and closed reduction in [place], Dr. Jimmye Norman  . POLYPECTOMY    . SHOULDER ARTHROSCOPY W/ ROTATOR CUFF REPAIR Right   . TRAPEZIUM RESECTION Right     Current Outpatient Prescriptions  Medication Sig Dispense Refill  . albuterol (PROVENTIL HFA;VENTOLIN HFA) 108 (90 Base) MCG/ACT inhaler Inhale 2 puffs into the lungs every 4 (four) hours as needed for wheezing or shortness of breath. 1 Inhaler 6  . aspirin EC 81 MG tablet Take 1 tablet (81 mg total) by mouth daily.    . calcium carbonate (TUMS - DOSED IN MG ELEMENTAL CALCIUM) 500 MG chewable tablet Chew 1 tablet by mouth daily. Pt isn't sure of dose.    . diltiazem (CARDIZEM CD) 180 MG 24 hr capsule Take 1 capsule (180 mg total) by mouth daily. 90 capsule 3  . fluticasone (FLONASE) 50 MCG/ACT nasal spray Place 2 sprays into both nostrils daily. 16 g 5  . gabapentin (NEURONTIN) 100 MG capsule Take 1 capsule by mouth 3 (three) times daily.  1  . loratadine (CLARITIN) 10 MG  tablet Take 1 tablet (10 mg total) by mouth daily. 30 tablet 11  . MYRBETRIQ 50 MG TB24 tablet Take 50 mg by mouth daily.     . nitroGLYCERIN (NITROSTAT) 0.4 MG SL tablet Place 1 tablet (0.4 mg total) under the tongue every 5 (five) minutes as needed for chest pain. 25 tablet 3  . omeprazole (PRILOSEC) 40 MG capsule Take 40 mg by mouth daily.     . rosuvastatin (CRESTOR) 10 MG tablet Take 1 tablet by mouth two times per week only 24 tablet 3  . traMADol (ULTRAM) 50 MG tablet Take 50 mg by mouth every 6 (six) hours as needed for severe pain.     . Vitamin D, Ergocalciferol, (DRISDOL) 50000 UNITS CAPS Take 50,000 Units by mouth every 7 (seven) days.      No current  facility-administered medications for this visit.     Allergies:   Levaquin [levofloxacin]; Morphine; Oxycodone-acetaminophen; Percocet [oxycodone-acetaminophen]; and Sulfonamide derivatives   Social History:  The patient  reports that she has never smoked. She has never used smokeless tobacco. She reports that she does not drink alcohol or use drugs.   Family History:  The patient's  family history includes Arrhythmia in her father; Cancer in her father and mother; Colon cancer (age of onset: 57) in her mother; Heart attack in her brother; Heart disease in her father; Lung cancer in her father.    ROS:  Please see the history of present illness.  Otherwise, review of systems is positive for Cough.  All other systems are reviewed and negative.    PHYSICAL EXAM: VS:  BP (!) 128/48   Pulse 72   Ht 5\' 3"  (1.6 m)   Wt 128 lb 3.2 oz (58.2 kg)   LMP 02/22/1983 (Approximate)   BMI 22.71 kg/m  , BMI Body mass index is 22.71 kg/m. GEN: Well nourished, well developed, in no acute distress  HEENT: normal  Neck: no JVD, no masses. No carotid bruits Cardiac: RRR without murmur or gallop                Respiratory:  clear to auscultation bilaterally, normal work of breathing GI: soft, nontender, nondistended, + BS MS: no deformity or atrophy  Ext: no pretibial edema, pedal pulses 2+= bilaterally Skin: warm and dry, no rash Neuro:  Strength and sensation are intact Psych: euthymic mood, full affect  EKG:  EKG is not ordered today.  Recent Labs: 11/03/2015: ALT 11 01/08/2016: TSH 1.620   Lipid Panel     Component Value Date/Time   CHOL 156 11/03/2015 0742   TRIG 70 11/03/2015 0742   HDL 79 11/03/2015 0742   CHOLHDL 2.0 11/03/2015 0742   VLDL 14 11/03/2015 0742   LDLCALC 63 11/03/2015 0742      Wt Readings from Last 3 Encounters:  06/10/16 128 lb 3.2 oz (58.2 kg)  04/25/16 133 lb 9.6 oz (60.6 kg)  04/15/16 134 lb 3.2 oz (60.9 kg)     Cardiac Studies Reviewed: Cardiac Cath  05-20-2015: Conclusion    The left ventricular systolic function is normal.  Mid RCA lesion, 30% stenosed.  Mid Cx lesion, 40% stenosed.  Ost Cx lesion, 70% stenosed.   1. Moderate single-vessel coronary artery disease involving the ostium of the left circumflex. Interrogated with FFR which is negative.  2. Widely patent LAD and RCA.  3. Normal LV function.  Recommendations: Medical therapy for nonobstructive CAD   CT Scan Chest 03-22-2016: FINDINGS: Cardiovascular: Normal heart size. No  significant pericardial fluid/thickening. Left anterior descending, left circumflex and right coronary atherosclerosis. Atherosclerotic nonaneurysmal thoracic aorta. Normal caliber pulmonary arteries.  Mediastinum/Nodes: No discrete thyroid nodules. Unremarkable esophagus. No pathologically enlarged axillary, mediastinal or gross hilar lymph nodes, noting limited sensitivity for the detection of hilar adenopathy on this noncontrast study. Stable coarsely calcified subcentimeter right supraclavicular, bilateral paratracheal, AP window and subcarinal nodes.  Lungs/Pleura: No pneumothorax. No pleural effusion. No acute consolidative airspace disease or lung masses. There is new mild patchy tree-in-bud opacity in the superior segment right lower lobe. There is stable moderate cystic bronchiectasis with associated volume loss and distortion in the medial segment right middle lobe. There is scattered mild cylindrical and cystic bronchiectasis throughout the remaining right lung, not appreciably changed, with associated scattered mucoid impaction. There is stable mild parenchymal bands with associated mild volume loss and distortion in the posterior right upper lobe, superior segment and basilar right lower lobe. No new significant regions of bronchiectasis. No frank honeycombing. No significant air trapping on the expiration sequence.  Upper abdomen: Simple 1.3 cm inferior left liver  lobe cyst. Simple 0.8 cm posterior interpolar right renal cyst.  Musculoskeletal: No aggressive appearing focal osseous lesions. Marked thoracic spondylosis.  IMPRESSION: 1. New mild patchy tree-in-bud opacity in the superior segment right lower lobe, most consistent with a mild nonspecific infectious or inflammatory bronchiolitis. 2. No appreciable change in the previously described findings of moderate cystic bronchiectasis in the medial segment right middle lobe, mild scattered bronchiectasis throughout the remaining right lung, and mild scarring in the right upper and right lower lobes. No evidence of progressive interstitial lung disease. 3. Aortic atherosclerosis. three-vessel coronary atherosclerosis.  ASSESSMENT AND PLAN: 1.  Coronary artery disease, native vessel, without symptoms of angina: The patient will continue on her medical program which includes aspirin, diltiazem, and Crestor. We reviewed her cardiac catheterization films together today. I explained the FFR analysis and that a negative FFR indicates a good prognosis with respect to her CAD. No further testing is indicated at this point.  2. Paroxysmal SVT: No recurrence of symptoms. She continues on diltiazem.  3. Essential hypertension: Her blood pressure is well controlled.  4. Hyperlipidemia: She is tolerating low-dose Crestor. Lipids are reviewed today. Last cholesterol panel from September 2017 demonstrated a total cholesterol 156, HDL 79, and LDL 63.  Current medicines are reviewed with the patient today.  The patient does not have concerns regarding medicines.  Labs/ tests ordered today include:  No orders of the defined types were placed in this encounter.   Disposition:   FU one year  Signed, Sherren Mocha, MD  06/10/2016 10:31 AM    [facility] [address], [place], [place]  [address] Phone: [phone]; Fax: [phone]

## 2016-07-13 ENCOUNTER — Encounter: Payer: Self-pay | Admitting: Podiatry

## 2016-07-13 ENCOUNTER — Ambulatory Visit (INDEPENDENT_AMBULATORY_CARE_PROVIDER_SITE_OTHER): Payer: Medicare Other | Admitting: Podiatry

## 2016-07-13 DIAGNOSIS — I2510 Atherosclerotic heart disease of native coronary artery without angina pectoris: Secondary | ICD-10-CM

## 2016-07-13 DIAGNOSIS — L84 Corns and callosities: Secondary | ICD-10-CM | POA: Diagnosis not present

## 2016-07-13 DIAGNOSIS — B351 Tinea unguium: Secondary | ICD-10-CM

## 2016-07-15 NOTE — Progress Notes (Signed)
Subjective:    Patient ID: [name], female   DOB: 82 y.o.   MRN: [id number]   HPI patient continues to experience significant discomfort with lesion formation plantar aspect left and also peeling of the right foot that she's concerned about    ROS      Objective:  Physical Exam neurovascular status intact with patient found have good digital perfusion well oriented with keratotic lesion sub-second metatarsal left third digit right and peeling of the right foot with no indications of blistering or other pathology     Assessment:    Dermatitis right foot with keratotic lesion left     Plan:    H&P both conditions reviewed and for the right we will begin Vaseline under occlusion along with sock therapy and I did debridement lesion left and right with no iatrogenic bleeding and will reappoint for routine care as needed

## 2016-07-29 ENCOUNTER — Encounter: Payer: Self-pay | Admitting: Physician Assistant

## 2016-07-31 ENCOUNTER — Other Ambulatory Visit: Payer: Self-pay | Admitting: Physician Assistant

## 2016-07-31 DIAGNOSIS — I10 Essential (primary) hypertension: Secondary | ICD-10-CM

## 2016-08-04 DIAGNOSIS — Q799 Congenital malformation of musculoskeletal system, unspecified: Secondary | ICD-10-CM | POA: Insufficient documentation

## 2016-08-31 ENCOUNTER — Ambulatory Visit (INDEPENDENT_AMBULATORY_CARE_PROVIDER_SITE_OTHER): Payer: Medicare Other | Admitting: Neurology

## 2016-08-31 ENCOUNTER — Encounter: Payer: Self-pay | Admitting: Neurology

## 2016-08-31 VITALS — BP 102/55 | HR 69 | Ht 63.0 in | Wt 132.0 lb

## 2016-08-31 DIAGNOSIS — R5382 Chronic fatigue, unspecified: Secondary | ICD-10-CM | POA: Diagnosis not present

## 2016-08-31 DIAGNOSIS — I2510 Atherosclerotic heart disease of native coronary artery without angina pectoris: Secondary | ICD-10-CM

## 2016-08-31 DIAGNOSIS — R4189 Other symptoms and signs involving cognitive functions and awareness: Secondary | ICD-10-CM

## 2016-08-31 NOTE — Patient Instructions (Signed)
Overall you are doing fairly well but I do want to suggest a few things today:   Remember to drink plenty of fluid, eat healthy meals and do not skip any meals. Try to eat protein with a every meal and eat a healthy snack such as fruit or nuts in between meals. Try to keep a regular sleep-wake schedule and try to exercise daily, particularly in the form of walking, 20-30 minutes a day, if you can.   As far as your medications are concerned, I would like to suggest  As far as diagnostic testing:   I would like to see you back in XXXXX, sooner if we need to. Please call us with any interim questions, concerns, problems, updates or refill requests.   Please also call us for any test results so we can go over those with you on the phone.  My clinical assistant and will answer any of your questions and relay your messages to me and also relay most of my messages to you.   Our phone number is [phone]. We also have an after hours call service for urgent matters and there is a physician on-call for urgent questions. For any emergencies you know to call 911 or go to the nearest emergency room

## 2016-08-31 NOTE — Progress Notes (Signed)
[name]    Provider:  Dr Jaynee Eagles Referring Provider: [name], MD Primary Care Physician:  [name], MD  CC:  Memory loss and fatigue  Interval history 08/31/2016: This is an absolutely lovely 82 year old female here for follow-up. She is having short-term memory loss. A few hours or the next day she forgets conversations or things she's been told. They called and invited her to their sister's birthday party, she couldn't remember the restaurant name. Started a few years ago and is worsening. No Fhx of dementia. She lives with daughter and son in [place]. 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  Patient snores heavily, she wakes up frequently to urinate, falls asleep during the day, her son-in-law heard her snoring through her door. She also has extreme fatigue during the day. Recommended sleep apnea evaluation.  HPI:  [name] is a 82 y.o. female for follow up of fatigue and weakness and a new problem memory loss. Past medical history of hypertension, carotid bruit right, degenerative joint disease, fatigue, sarcoidosis, hyperlipidemia, left leg femur broken status- post rods, right hip replacement. She has significant fatigue. She complains of generalized weakness, fatigue, imbalance, sinking feelings like she is going to pass out, decreased energy, decreased smell and loss of taste, generally feeling bad. Extensive lab testing including CK, acetylcholine receptor antibodies, B1, multiple myeloma panel, heavy metals, rheumatoid factor, Sjogren's antibodies, ANA with reflex, sedimentation rate, angiotensin-converting enzyme, TSH, methylmalonic acid, B12 and folate all normal. MRI of the brain showed Mild diffuse and moderate perisylvian atrophy and mild small vessel disease but no etiology for her symptoms. EMG/NCS today to evaluate for other neuromuscular disorders such as myopathy/myositis. Reviewed all results with  patient today.   Reviewed notes, labs and imaging from outside physicians, which showed:  B12 283. This was drawn 12/07/2015. CMP showed elevated MCV 104, BUN 24, creatinine 0.8 otherwise unremarkable drawn 11/30/2015.   Patient was seen a Barista. Complaining of strange sensations in her head, like she is thinking, kind of weak, could not describe it. Feels like she is having "sinking spells. Feels that balance, has also been seen by cardiology in October 2017. She was evaluated for nonobstructive one-vessel coronary artery disease, is continuing aspirin and statin PSVT without recurrence on calcium channel blocker, blood pressure control, history of hyperlipidemia intolerant of statins however is using Crestor twice a week, follow-up Dopplers and carotid artery disease were ordered, also discussed fatigue after eating, she follows with pulmonology for history of sarcoidosis. She has arthritis  Review of Systems: Patient complains of symptoms per HPI as well as the following symptoms: Fatigue, cough, memory loss, constipation,. Pertinent negatives and positives per HPI. All others negative.   Social History   Social History  . Marital status: Widowed    Spouse name: N/A  . Number of children: 1  . Years of education: N/A   Occupational History  . retired     still works as Oceanographer  .  Retired   Social History Main Topics  . Smoking status: Never Smoker  . Smokeless tobacco: Never Used  . Alcohol use No  . Drug use: No  . Sexual activity: No   Other Topics Concern  . Not on file   Social History Narrative   Lives with daughter and son in law   Caffeine use:  Coffee 1 per day    Family History  Problem Relation Age of Onset  .  Colon cancer Mother 89  . Cancer Mother   . Lung cancer Father   . Heart disease Father   . Arrhythmia Father   . Cancer Father   . Heart attack Brother   . Esophageal cancer Neg Hx   . Rectal cancer Neg Hx   .  Stomach cancer Neg Hx   . Stroke Neg Hx     Past Medical History:  Diagnosis Date  . Allergy    SEASONAL  . Arthritis   . CAD (coronary artery disease)    a. Myoview 10/15 - normal EF 70% // Myoview 11/16: EF 75%, normal perfusion, (there was no blood pressure drop at low level exercise with Lexiscan infusion), low risk study // c. LHC 3/17 - LAD irregs, oLCx 70 (neg FFR), mRCA 30, EF 55-65% >> med Rx  . Carotid stenosis    a. Carotid US 9/15 - Bilateral 1-39% ICA >> FU 2 years // b. Bilateral ICA 1-39 >> FU prn  . Esophageal reflux   . Hematochezia   . History of echocardiogram    a. Echo 10/15 - EF 60-65%, no RWMA  . HLD (hyperlipidemia)   . Hx of colonoscopy   . Osteoporosis   . Other chronic pulmonary heart diseases   . Paroxysmal supraventricular tachycardia ([place])   . Sarcoidosis     Past Surgical History:  Procedure Laterality Date  . arm surgery Right   . BLADDER SURGERY    . CARDIAC CATHETERIZATION N/A 05/20/2015   Procedure: Left Heart Cath and Coronary Angiography;  Surgeon: Sherren Mocha, MD;  Location: [facility];  Service: Cardiovascular;  Laterality: N/A;  . CARPAL TUNNEL RELEASE Left   . COLONOSCOPY    . FOOT SURGERY Right   . HIP SURGERY Right 2011   full replacement  . LEG SURGERY Left May 2014   femur fracture s/p open and closed reduction in [place], Dr. Jimmye Norman  . POLYPECTOMY    . SHOULDER ARTHROSCOPY W/ ROTATOR CUFF REPAIR Right   . TRAPEZIUM RESECTION Right     Current Outpatient Prescriptions  Medication Sig Dispense Refill  . albuterol (PROVENTIL HFA;VENTOLIN HFA) 108 (90 Base) MCG/ACT inhaler Inhale 2 puffs into the lungs every 4 (four) hours as needed for wheezing or shortness of breath. 1 Inhaler 6  . aspirin EC 81 MG tablet Take 1 tablet (81 mg total) by mouth daily.    . calcium carbonate (TUMS - DOSED IN MG ELEMENTAL CALCIUM) 500 MG chewable tablet Chew 1 tablet by mouth daily. Pt isn't sure of dose.    Marland Kitchen CARTIA XT 180 MG 24 hr  capsule TAKE 1 CAPSULE(180 MG) BY MOUTH DAILY 90 capsule 2  . fluticasone (FLONASE) 50 MCG/ACT nasal spray Place 2 sprays into both nostrils daily. (Patient taking differently: Place 2 sprays into both nostrils as needed. ) 16 g 5  . loratadine (CLARITIN) 10 MG tablet Take 1 tablet (10 mg total) by mouth daily. 30 tablet 11  . MYRBETRIQ 50 MG TB24 tablet Take 50 mg by mouth daily.     . nitroGLYCERIN (NITROSTAT) 0.4 MG SL tablet Place 1 tablet (0.4 mg total) under the tongue every 5 (five) minutes as needed for chest pain. 25 tablet 3  . omeprazole (PRILOSEC) 40 MG capsule Take 40 mg by mouth daily.     . rosuvastatin (CRESTOR) 10 MG tablet Take 1 tablet by mouth two times per week only 24 tablet 3  . traMADol (ULTRAM) 50 MG tablet Take 50 mg  by mouth every 6 (six) hours as needed for severe pain.     . Vitamin D, Ergocalciferol, (DRISDOL) 50000 UNITS CAPS Take 50,000 Units by mouth every 7 (seven) days.      No current facility-administered medications for this visit.     Allergies as of 08/31/2016 - Review Complete 08/31/2016  Allergen Reaction Noted  . Levaquin [levofloxacin] Other (See Comments) 06/10/2016  . Morphine Nausea Only 08/08/2008  . Oxycodone-acetaminophen  08/08/2008  . Percocet [oxycodone-acetaminophen] Nausea And Vomiting 12/26/2013  . Sulfonamide derivatives  08/08/2008    Vitals: BP (!) 102/55   Pulse 69   Ht _0  (1.6 m)   Wt 132 lb (59.9 kg)   LMP 02/22/1983 (Approximate)   BMI 23.38 kg/m  Last Weight:  Wt Readings from Last 1 Encounters:  08/31/16 132 lb (59.9 kg)   Last Height:   Ht Readings from Last 1 Encounters:  08/31/16 _1  (1.6 m)        Physical exam: Exam: Gen: NAD, conversant                CV: RRR, no MRG. No Carotid Bruits. No peripheral edema, warm, nontender Eyes: Conjunctivae clear without exudates or hemorrhage  Neuro: Detailed Neurologic Exam  Speech:    Speech is normal; fluent and spontaneous with normal  comprehension.  Cognition:  MMSE - Mini Mental State Exam 08/31/2016  Orientation to time 5  Orientation to Place 5  Registration 3  Attention/ Calculation 3  Recall 3  Language- name 2 objects 2  Language- repeat 1  Language- follow 3 step command 3  Language- read & follow direction 1  Write a sentence 1  Copy design 1  Total score 28      The patient is oriented to person, place, and time;     recent and remote memory intact;      Cranial Nerves:    The pupils are equal, round, and reactive to light. Visual fields are full to finger confrontation. Extraocular movements are intact. Trigeminal sensation is intact and the muscles of mastication are normal. Left ptosis otherwise the face is symmetric. The palate elevates in the midline. Hearing intact. Voice is normal. Shoulder shrug is normal. The tongue has normal motion without fasciculations.   Coordination:    Normal finger to nose and heel to shin.   Gait:    Good stride, good arm swing, slightly antalgic   Motor Observation: Can get out of her seat without using hands with minimal difficulty, no asymmetry, no atrophy, and no involuntary movements noted.  Tone:    Normal muscle tone.    Posture:    Posture is normal. normal erect    Strength: 4/5 proximal uppers, 4/5 otherwise in the uppers 4-/5 proximal hip flexion Otherwise normal      Sensation: intact to LT     Reflex Exam:  DTR's:    Deep tendon reflexes in the upper and lower extremities are symmetricalbilaterally.   Toes:    The toes are equivocal bilaterally.  (right toe upgoing vs previous foot surgery?) Clonus:    Clonus is absent.      Assessment/Plan:  Absolutely lovely 82 y.o. female here as a referral from Dr. Dagmar Hait for fatigue and weakness. Today c/o memory changes. MMSE 28/30. Past medical history of hypertension, carotid bruit right, degenerative joint disease, fatigue, sarcoidosis, hyperlipidemia, left leg femur broken status  post rods, right hip replacement. She has significant fatigue. She complains of generalized weakness, fatigue,  imbalance, sinking feelings like she is going to pass out, decreased energy, decreased smell and loss of taste, generally feeling bad which is stable.  MRI brain to evaluate for intracranial etiologies of her symptoms such as stroke or other lesions was unremarkable Emg/ncs was unremarkable Extensive labs Lab testing will be ordered today including for neuromuscular disorders and peripheral neuropathic disorders unremarkable Physical therapy for strength training, gait and balance completed  Fatigue, snoring, falling asleep: Recommend sleep evaluation and possibly sleep test. Patient will think about it.  Memory loss: Patient is lovely this is likely normal cognitive aging. Discussed formal neurocognitive testing or following clinically. Patient would like to just follow clinically and we will see her back in 4-6 months.  F/u 4-6 months  A total of 25 minutes was spent in with this patient. Over half this time was spent on counseling patient on the  normal cognitive aging, fatigue diagnosis and different therapeutic options available.

## 2016-10-05 ENCOUNTER — Ambulatory Visit (INDEPENDENT_AMBULATORY_CARE_PROVIDER_SITE_OTHER): Payer: Medicare Other | Admitting: Podiatry

## 2016-10-05 ENCOUNTER — Encounter: Payer: Self-pay | Admitting: Podiatry

## 2016-10-05 DIAGNOSIS — L84 Corns and callosities: Secondary | ICD-10-CM | POA: Diagnosis not present

## 2016-10-06 NOTE — Progress Notes (Signed)
Subjective:    Patient ID: [name], female   DOB: 82 y.o.   MRN: [id number]   HPI patient presents stating I have lesions underneath this left foot that are sore    ROS      Objective:  Physical Exam neurovascular status intact with keratotic lesion sub-metatarsals left     Assessment:   Chronic lesion formation left      Plan:    Sharp sterile debridement of lesions left with no iatrogenic bleeding noted

## 2016-10-11 ENCOUNTER — Other Ambulatory Visit: Payer: Self-pay | Admitting: Orthopedic Surgery

## 2016-10-11 DIAGNOSIS — M7122 Synovial cyst of popliteal space [Baker], left knee: Secondary | ICD-10-CM

## 2016-10-12 ENCOUNTER — Other Ambulatory Visit: Payer: Self-pay | Admitting: Orthopedic Surgery

## 2016-10-12 ENCOUNTER — Ambulatory Visit
Admission: RE | Admit: 2016-10-12 | Discharge: 2016-10-12 | Disposition: A | Discharge status: Home / Self Care | Payer: Medicare Other | Source: Ambulatory Visit | Attending: Orthopedic Surgery | Admitting: Orthopedic Surgery

## 2016-10-12 ENCOUNTER — Other Ambulatory Visit: Payer: Self-pay | Admitting: Radiology

## 2016-10-12 DIAGNOSIS — M7121 Synovial cyst of popliteal space [Baker], right knee: Secondary | ICD-10-CM

## 2016-10-12 DIAGNOSIS — M7122 Synovial cyst of popliteal space [Baker], left knee: Secondary | ICD-10-CM

## 2016-10-18 ENCOUNTER — Ambulatory Visit (HOSPITAL_COMMUNITY): Payer: Medicare Other

## 2016-10-31 ENCOUNTER — Encounter (HOSPITAL_COMMUNITY): Payer: Self-pay

## 2016-10-31 ENCOUNTER — Ambulatory Visit (HOSPITAL_COMMUNITY)
Admission: RE | Admit: 2016-10-31 | Discharge: 2016-10-31 | Disposition: A | Discharge status: Home / Self Care | Payer: Medicare Other | Source: Ambulatory Visit | Attending: Internal Medicine | Admitting: Internal Medicine

## 2016-10-31 DIAGNOSIS — M810 Age-related osteoporosis without current pathological fracture: Secondary | ICD-10-CM | POA: Diagnosis present

## 2016-10-31 MED ORDER — DENOSUMAB 60 MG/ML ~~LOC~~ SOLN
60.0000 mg | Freq: Once | SUBCUTANEOUS | Status: AC
  Administered 2016-10-31: 60 mg via SUBCUTANEOUS
  Filled 2016-10-31: qty 1

## 2016-10-31 NOTE — Discharge Instructions (Signed)
Denosumab injection / Prolia injection  What is this medicine? DENOSUMAB (den oh sue mab) slows bone breakdown. Prolia is used to treat osteoporosis in women after menopause and in men. Delton See is used to treat a high calcium level due to cancer and to prevent bone fractures and other bone problems caused by multiple myeloma or cancer bone metastases. Delton See is also used to treat giant cell tumor of the bone. This medicine may be used for other purposes; ask your health care provider or pharmacist if you have questions. COMMON BRAND NAME(S): Prolia, XGEVA What should I tell my health care provider before I take this medicine? They need to know if you have any of these conditions: -dental disease -having surgery or tooth extraction -infection -kidney disease -low levels of calcium or Vitamin D in the blood -malnutrition -on hemodialysis -skin conditions or sensitivity -thyroid or parathyroid disease -an unusual reaction to denosumab, other medicines, foods, dyes, or preservatives -pregnant or trying to get pregnant -breast-feeding How should I use this medicine? This medicine is for injection under the skin. It is given by a health care professional in a hospital or clinic setting. If you are getting Prolia, a special MedGuide will be given to you by the pharmacist with each prescription and refill. Be sure to read this information carefully each time. For Prolia, talk to your pediatrician regarding the use of this medicine in children. Special care may be needed. For Delton See, talk to your pediatrician regarding the use of this medicine in children. While this drug may be prescribed for children as young as 13 years for selected conditions, precautions do apply. Overdosage: If you think you have taken too much of this medicine contact a poison control center or emergency room at once. NOTE: This medicine is only for you. Do not share this medicine with others. What if I miss a dose? It is  important not to miss your dose. Call your doctor or health care professional if you are unable to keep an appointment. What may interact with this medicine? Do not take this medicine with any of the following medications: -other medicines containing denosumab This medicine may also interact with the following medications: -medicines that lower your chance of fighting infection -steroid medicines like prednisone or cortisone This list may not describe all possible interactions. Give your health care provider a list of all the medicines, herbs, non-prescription drugs, or dietary supplements you use. Also tell them if you smoke, drink alcohol, or use illegal drugs. Some items may interact with your medicine. What should I watch for while using this medicine? Visit your doctor or health care professional for regular checks on your progress. Your doctor or health care professional may order blood tests and other tests to see how you are doing. Call your doctor or health care professional for advice if you get a fever, chills or sore throat, or other symptoms of a cold or flu. Do not treat yourself. This drug may decrease your body's ability to fight infection. Try to avoid being around people who are sick. You should make sure you get enough calcium and vitamin D while you are taking this medicine, unless your doctor tells you not to. Discuss the foods you eat and the vitamins you take with your health care professional. See your dentist regularly. Brush and floss your teeth as directed. Before you have any dental work done, tell your dentist you are receiving this medicine. Do not become pregnant while taking this medicine or for  5 months after stopping it. Talk with your doctor or health care professional about your birth control options while taking this medicine. Women should inform their doctor if they wish to become pregnant or think they might be pregnant. There is a potential for serious side  effects to an unborn child. Talk to your health care professional or pharmacist for more information. What side effects may I notice from receiving this medicine? Side effects that you should report to your doctor or health care professional as soon as possible: -allergic reactions like skin rash, itching or hives, swelling of the face, lips, or tongue -bone pain -breathing problems -dizziness -jaw pain, especially after dental work -redness, blistering, peeling of the skin -signs and symptoms of infection like fever or chills; cough; sore throat; pain or trouble passing urine -signs of low calcium like fast heartbeat, muscle cramps or muscle pain; pain, tingling, numbness in the hands or feet; seizures -unusual bleeding or bruising -unusually weak or tired Side effects that usually do not require medical attention (report to your doctor or health care professional if they continue or are bothersome): -constipation -diarrhea -headache -joint pain -loss of appetite -muscle pain -runny nose -tiredness -upset stomach This list may not describe all possible side effects. Call your doctor for medical advice about side effects. You may report side effects to FDA at 1-800-FDA-1088. Where should I keep my medicine? This medicine is only given in a clinic, doctor's office, or other health care setting and will not be stored at home. NOTE: This sheet is a summary. It may not cover all possible information. If you have questions about this medicine, talk to your doctor, pharmacist, or health care provider.  2018 Elsevier/Gold Standard (2016-03-01 19:17:21)

## 2016-11-02 ENCOUNTER — Ambulatory Visit (INDEPENDENT_AMBULATORY_CARE_PROVIDER_SITE_OTHER): Payer: Medicare Other | Admitting: Emergency Medicine

## 2016-11-02 ENCOUNTER — Encounter: Payer: Self-pay | Admitting: Emergency Medicine

## 2016-11-02 DIAGNOSIS — Z23 Encounter for immunization: Secondary | ICD-10-CM

## 2016-11-02 DIAGNOSIS — D869 Sarcoidosis, unspecified: Secondary | ICD-10-CM

## 2016-11-02 DIAGNOSIS — I2510 Atherosclerotic heart disease of native coronary artery without angina pectoris: Secondary | ICD-10-CM

## 2016-11-02 MED ORDER — TIOTROPIUM BROMIDE-OLODATEROL 2.5-2.5 MCG/ACT IN AERS: 2.0000 | INHALATION_SPRAY | Freq: Every day | RESPIRATORY_TRACT | 0 refills | Status: DC

## 2016-11-02 MED ORDER — FLUTICASONE PROPIONATE 50 MCG/ACT NA SUSP: 2.0000 | Freq: Every day | NASAL | 2 refills | Status: DC

## 2016-11-02 NOTE — Assessment & Plan Note (Signed)
Noted on her recent CT scan of the chest. Also some micronodular disase. At some risk for mycobacterial disease. Repeat Ct chest now to compare

## 2016-11-02 NOTE — Assessment & Plan Note (Signed)
Repeat CT chest.  Start Stiolto to see if she benefits, if so then order through pharmacy

## 2016-11-02 NOTE — Assessment & Plan Note (Signed)
Remains quite active, lots ofclear drainage. She  Uses loratadine daily. W will start using her fluticasone nasal spray everyday on a schedule to see if she benefits.

## 2016-11-02 NOTE — Patient Instructions (Addendum)
We will repeat your CT scan of the chest, no contrast, high resolution cuts, to follow sarcoidosis We will try starting Stiolto 2 puffs once a day. Call to let us know whether this has helped your breathing. If so then we will order through your pharmacy.  Continue loratadine 10mg  daily Start using flonase nasal spray, 2 sprays each side once a day (every day) Flu Shot today.  Follow with Dr [name] in 2  months or sooner if you have any problems.

## 2016-11-02 NOTE — Progress Notes (Signed)
Subjective:    Patient ID: [name], female    DOB: Jun 29, 1934, 82 y.o.   MRN: [id number]  HPI 82 yo woman, never smoker, hx sarcoidosis that was dx by Dr Lucia Gaskins in [place] after FOB (? Whether she had biopsies or not), PSVT, GERD. She also had a hx severe PNA with empyema and chest tube (90's). She has DOE and difficulty walking. The decrease in function has come on gradually. Her cardiac stress test was negative as below. PFT show obstruction with a DLCO defect.   PFT 12/06/13 >> severe AFL without BD response.   ROV 02/25/14 -- follow up for progressive dyspnea in setting sarcoidosis. Last time we decided to initiate short acting beta agonist to see if she would benefit. Walking oximetry was reassuring. She has been doing a bit better - she believes that she does better now that the hot weather is passed. She used SABA a few times and felt that it helped her some. She is asking about pneumonia vaccine.   ROV 09/18/14 -- follow-up visit for history of sarcoidosis. Her last visit we started Symbicort to see if she would benefit.  She used the symbicort about qd, started to forget it. She isn't sure that she felt better on it. She reports that she had an acute flare in June, followed a trip to [place]. She required treatment with pred and abx. She has albuterol that she uses prn, very rarely. She is otherwise well, no pulm limitations at baseline.   ROV1/26/17 -- patient has history of sarcoidosis, severe obstructive lung disease. Also a history of a prior empyema that required chest use drainage. She underwent a repeat CT scan of her chest in October 2016 that confirmed some areas of bronchiectasis and architectural distortion in the right middle lobe and elsewhere. There was no interstitial disease and very little change compared with 2014.   She stopped Symbicort ;last visit, has not really missed it. She is breathing well, but is having problems with nasal gtt, R ear fullness, a hacking cough. She  moved end of October - new exposures, new home.  Has not needed SABA.                 ROV 09/15/15 -- patient follows up for her history of severe obstructive lung disease in the setting of sarcoidosis. She's also had a history of empyema and chest tube drainage.  Also with a history of chronic rhinitis with associated cough. I last saw her in January 2017 and at that time we started a nasal steroid and loratadine. She took reliably for a while, but then tried stopping it to see what would happen.  She is having a lot of drainage and sneezing now.  Last CT chest stable in 11/2014.  She has SABA available to use but has not required it. She has some SOB during the hot months, otherwise no sx.  ROV 03/14/16 -- this is a follow-up visit for history of sarcoidosis, associated severe obstructive lung disease, hx of empyema and chest tube drainage in the past.    Also with a history of chronic cough in the setting of rhinitis. Her last CT scan of the chest was 12/02/14. She has some exertional SOB, can happen with shopping. She is not using Symbicort. She is on loratadine qd, flonase prn. She does not have an albuterol inhaler right now.          ROV 11/01/16 -- patient has a history of sarcoidosis, severe obstructive lung disease. She also has restrictive disease due to history of empyema. Allergic rhinitis, chronic cough. She was seen here in match w URI, AE-COPD. Had an allergic rxn to levaquin. Tells me that she has persistent nasal gtt, on loraradine. Uses flonase prn.  She has some daily cough, clear mucous. She is not on scheduled BD, tells me that her breathing is . She uses albuterol rarely. She tries to exercise several times a week.   Review of Systems  Constitutional: Negative  for fever and unexpected weight change.  HENT: Negative for congestion, dental problem, ear pain, nosebleeds, postnasal drip, rhinorrhea, sinus pressure, sneezing, sore throat and trouble swallowing.   Eyes: Negative for redness and itching.  Respiratory: Negative for cough, chest tightness, shortness of breath and wheezing.   Cardiovascular: Negative for palpitations and leg swelling.  Gastrointestinal: Negative for nausea and vomiting.  Genitourinary: Negative for dysuria.  Musculoskeletal: Negative for joint swelling.  Skin: Negative for rash.  Neurological: Negative for headaches.  Hematological: Does not bruise/bleed easily.  Psychiatric/Behavioral: Negative for dysphoric mood. The patient is not nervous/anxious.       Objective:   Physical Exam Vitals:   11/02/16 1331  BP: 130/60  Pulse: 69  SpO2: 94%  Weight: 128 lb 12.8 oz (58.4 kg)  Height: 5\' 3"  (1.6 m)   Gen: Pleasant, kyphotic elderly woman, in no distress,  normal affect  ENT: No lesions,  mouth clear,  oropharynx clear, no postnasal drip  Neck: No JVD, no TMG, no carotid bruits  Lungs: No use of accessory muscles, clear without rales or rhonchi  Cardiovascular: RRR, heart sounds normal, no murmur or gallops, no peripheral edema  Musculoskeletal: No deformities, no cyanosis or clubbing  Neuro: alert, non focal  Skin: Warm, no lesions or rashes   03/23/16 --  COMPARISON:  12/02/2014 high-resolution chest CT.  FINDINGS: Cardiovascular: Normal heart size. No significant pericardial fluid/thickening. Left anterior descending, left circumflex and right coronary atherosclerosis. Atherosclerotic nonaneurysmal thoracic aorta. Normal caliber pulmonary arteries.  Mediastinum/Nodes: No discrete thyroid nodules. Unremarkable esophagus. No pathologically enlarged axillary, mediastinal or gross hilar lymph nodes, noting limited sensitivity for the detection of hilar adenopathy on this noncontrast study. Stable  coarsely calcified subcentimeter right supraclavicular, bilateral paratracheal, AP window and subcarinal nodes.  Lungs/Pleura: No pneumothorax. No pleural effusion. No acute consolidative airspace disease or lung masses. There is new mild patchy tree-in-bud opacity in the superior segment right lower lobe. There is stable moderate cystic bronchiectasis with associated volume loss and distortion in the medial segment right middle lobe. There is scattered mild cylindrical and cystic bronchiectasis throughout the remaining right lung, not appreciably changed, with associated scattered mucoid impaction. There is stable mild parenchymal bands with associated mild volume loss and distortion in the posterior right upper lobe, superior segment and basilar right lower lobe. No new significant regions of bronchiectasis. No frank honeycombing. No significant air trapping on the  expiration sequence.  Upper abdomen: Simple 1.3 cm inferior left liver lobe cyst. Simple 0.8 cm posterior interpolar right renal cyst.  Musculoskeletal: No aggressive appearing focal osseous lesions. Marked thoracic spondylosis.  IMPRESSION: 1. New mild patchy tree-in-bud opacity in the superior segment right lower lobe, most consistent with a mild nonspecific infectious or inflammatory bronchiolitis. 2. No appreciable change in the previously described findings of moderate cystic bronchiectasis in the medial segment right middle lobe, mild scattered bronchiectasis throughout the remaining right lung, and mild scarring in the right upper and right lower lobes. No evidence of progressive interstitial lung disease. 3. Aortic atherosclerosis. three-vessel coronary atherosclerosis.      Assessment & Plan:  Allergic rhinitis  Remains quite active, lots ofclear drainage. She  Uses loratadine daily. W will start using her fluticasone nasal spray everyday on a schedule to see if she benefits.  Bronchiectasis without  complication ([place])  Noted on her recent CT scan of the chest. Also some micronodular disase. At some risk for mycobacterial disease. Repeat Ct chest now to compare  SARCOIDOSIS, PULMONARY Repeat CT chest.  Start Stiolto to see if she benefits, if so then order through pharmacy  Baltazar Apo, MD, PhD 11/02/2016, 2:03 PM [facility] [phone] or if no answer [phone]

## 2016-11-08 ENCOUNTER — Ambulatory Visit (INDEPENDENT_AMBULATORY_CARE_PROVIDER_SITE_OTHER)
Admission: RE | Admit: 2016-11-08 | Discharge: 2016-11-08 | Disposition: A | Discharge status: Home / Self Care | Payer: Medicare Other | Source: Ambulatory Visit | Attending: Emergency Medicine | Admitting: Emergency Medicine

## 2016-11-08 DIAGNOSIS — D869 Sarcoidosis, unspecified: Secondary | ICD-10-CM

## 2016-11-28 ENCOUNTER — Telehealth: Payer: Self-pay | Admitting: Emergency Medicine

## 2016-11-28 NOTE — Telephone Encounter (Signed)
Spoke with patient. Advised her that her immunization chart has been printed and placed up front. She verbalized understanding. Nothing else needed at time of call.

## 2016-12-12 ENCOUNTER — Ambulatory Visit: Payer: Medicare Other | Admitting: Physician Assistant

## 2016-12-12 NOTE — Progress Notes (Signed)
Cardiology Office Note:    Date:  12/13/2016   ID:  [name], DOB 1934/10/26, MRN [id number]  PCP:  [name], MD  Cardiologist:  Dr. Sherren Mocha   Pulmonology: Dr. [name]  Referring MD: [name], MD   Chief Complaint  Patient presents with  . Coronary Artery Disease    Follow-up    History of Present Illness:    [name] is a 82 y.o. female with a hx of CAD, pulmonary sarcoidosis, HTN, HL, SVT.  Echo in 10/15 demonstrated normal LVF.  Myoview in 10/15 demonstrated no ischemia.  She had a high res CT in 10/16 with coronary calcification. Myoview in 11/16 was neg for ischemia. She then had symptoms of bilateral arm pain concerning for angina and LHC in 3/17 demonstrated ostial LCx stenosis of 60-70% that was not hemodynamically significant by FFR.  Last seen by Dr. Burt Knack 4/18.  [name] returns for follow-up.  She is here alone.  She denies chest discomfort, syncope, edema.  She has chronic dyspnea related to sarcoid.  She thinks that this may be changing somewhat.  Prior CV studies:   The following studies were reviewed today:  Carotid US 10/17 bilat ICA 1-39 >> FU 1 year  [facility] 05/20/15 LAD plaque - nonobstructive LCx ost 70% >> FFR 0.99 > 0.95 (not hemodynamically significant); mid 40% RCA mid 30% EF 55-65% 1. Moderate single-vessel CAD involving the ostium of the LCx. Interrogated with FFR which is negative. 2. Widely patent LAD and RCA. 3. Normal LV function. Recommendations: Medical therapy for nonobstructive CAD.  Myoview 11/16 EF 75%, no ischemia, low risk  High Resolution CT 12/02/14 IMPRESSION: 1. No findings to suggest interstitial lung disease. 2. The overall appearance the chest is very similar to the prior study 11/28/2012. Findings are nonspecific, but could be seen in the setting of reported sarcoidosis, as detailed above. 3. Areas of bronchiectasis are noted in the right lung, most severe in the medial segment of the  right middle lobe where there are areas of cystic bronchiectasis and architectural distortion. 4. Atherosclerosis, including left main and 3 vessel coronary artery disease. Assessment for potential risk factor modification, dietary therapy or pharmacologic therapy may be warranted, if clinically indicated. 5. Additional incidental findings, as above.  Myoview 10/15 Normal stress nuclear study. LV Ejection Fraction: 70%.  Echo 10/15 EF 60-65%, no RWMA  Carotid US 9/15 Bilateral 1-39% ICA >> FU 2 years  Past Medical History:  Diagnosis Date  . Allergy    SEASONAL  . Arthritis   . CAD (coronary artery disease)    a. Myoview 10/15 - normal EF 70% // Myoview 11/16: EF 75%, normal perfusion, (there was no blood pressure drop at low level exercise with Lexiscan infusion), low risk study // c. LHC 3/17 - LAD irregs, oLCx 70 (neg FFR), mRCA 30, EF 55-65% >> med Rx  . Carotid stenosis    a. Carotid US 9/15 - Bilateral 1-39% ICA >> FU 2 years // b. Bilateral ICA 1-39 >> FU prn  . Esophageal reflux   . Hematochezia   . History of echocardiogram    a. Echo 10/15 - EF 60-65%, no RWMA  . HLD (hyperlipidemia)   . Hx of colonoscopy   . Osteoporosis   . Other chronic pulmonary heart diseases   . Paroxysmal supraventricular tachycardia ([place])   . Sarcoidosis     Past Surgical History:  Procedure Laterality Date  . arm surgery Right   . BLADDER SURGERY    .  CARDIAC CATHETERIZATION N/A 05/20/2015   Procedure: Left Heart Cath and Coronary Angiography;  Surgeon: Sherren Mocha, MD;  Location: [facility];  Service: Cardiovascular;  Laterality: N/A;  . CARPAL TUNNEL RELEASE Left   . COLONOSCOPY    . FOOT SURGERY Right   . HIP SURGERY Right 2011   full replacement  . LEG SURGERY Left May 2014   femur fracture s/p open and closed reduction in [place], Dr. Jimmye Norman  . POLYPECTOMY    . SHOULDER ARTHROSCOPY W/ ROTATOR CUFF REPAIR Right   . TRAPEZIUM RESECTION Right     Current  Medications: Current Meds  Medication Sig  . albuterol (PROVENTIL HFA;VENTOLIN HFA) 108 (90 Base) MCG/ACT inhaler Inhale 2 puffs into the lungs every 4 (four) hours as needed for wheezing or shortness of breath.  Marland Kitchen aspirin EC 81 MG tablet Take 1 tablet (81 mg total) by mouth daily.  . calcium carbonate (TUMS - DOSED IN MG ELEMENTAL CALCIUM) 500 MG chewable tablet Chew 1 tablet by mouth daily. Pt isn't sure of dose.  Marland Kitchen CARTIA XT 180 MG 24 hr capsule TAKE 1 CAPSULE(180 MG) BY MOUTH DAILY  . fluticasone (FLONASE) 50 MCG/ACT nasal spray Place into both nostrils as needed for allergies or rhinitis.  Marland Kitchen loratadine (CLARITIN) 10 MG tablet Take 1 tablet (10 mg total) by mouth daily.  Marland Kitchen MYRBETRIQ 50 MG TB24 tablet Take 50 mg by mouth daily.   . nitroGLYCERIN (NITROSTAT) 0.4 MG SL tablet Place 1 tablet (0.4 mg total) under the tongue every 5 (five) minutes as needed for chest pain.  Marland Kitchen omeprazole (PRILOSEC) 40 MG capsule Take 40 mg by mouth daily.   . rosuvastatin (CRESTOR) 10 MG tablet Take 1 tablet by mouth two times per week only  . traMADol (ULTRAM) 50 MG tablet Take 50 mg by mouth every 6 (six) hours as needed for severe pain.      Allergies:   Levaquin [levofloxacin]; Morphine; Oxycodone-acetaminophen; Percocet [oxycodone-acetaminophen]; and Sulfonamide derivatives   Social History   Social History  . Marital status: Widowed    Spouse name: N/A  . Number of children: 1  . Years of education: N/A   Occupational History  . retired     still works as Oceanographer  .  Retired   Social History Main Topics  . Smoking status: Never Smoker  . Smokeless tobacco: Never Used  . Alcohol use No  . Drug use: No  . Sexual activity: No   Other Topics Concern  . None   Social History Narrative   Lives with daughter and son in law   Caffeine use:  Coffee 1 per day     Family Hx: The patient's family history includes Arrhythmia in her father; Cancer in her father and mother; Colon cancer  (age of onset: 21) in her mother; Heart attack in her brother; Heart disease in her father; Lung cancer in her father. There is no history of Esophageal cancer, Rectal cancer, Stomach cancer, or Stroke.  ROS:   Please see the history of present illness.    ROS All other systems reviewed and are negative.   EKGs/Labs/Other Test Reviewed:    EKG:  EKG is  ordered today.  The ekg ordered today demonstrates normal sinus rhythm, heart rate 62, normal axis, septal Q waves, QTC 408 ms, no change since prior tracing  Recent Labs: 01/08/2016: TSH 1.620   Recent Lipid Panel Lab Results  Component Value Date/Time   CHOL 156 11/03/2015 07:42 AM  TRIG 70 11/03/2015 07:42 AM   HDL 79 11/03/2015 07:42 AM   CHOLHDL 2.0 11/03/2015 07:42 AM   LDLCALC 63 11/03/2015 07:42 AM   Labs obtained via [facility] K[facility] Tool: Cholesterol, total 166.000 m 11/29/2016 HDL 62.000 % 11/29/2016 LDL 89.000 mg 11/29/2016 Triglycerides 77.000 11/29/2016 Hemoglobin 13.400 g/ 11/29/2016 Creatinine, Serum 0.800 mg/ 11/29/2016 ALT (SGPT) 9.000 unit 11/29/2016 TSH 1.080 06/07/2016   Physical Exam:    VS:  BP (!) 110/58   Pulse 62   Ht 5\' 3"  (1.6 m)   Wt 130 lb 12.8 oz (59.3 kg)   LMP 02/22/1983 (Approximate)   SpO2 96%   BMI 23.17 kg/m     Wt Readings from Last 3 Encounters:  12/13/16 130 lb 12.8 oz (59.3 kg)  11/02/16 128 lb 12.8 oz (58.4 kg)  10/31/16 127 lb 8 oz (57.8 kg)     Physical Exam  Constitutional: She is oriented to person, place, and time. She appears well-developed and well-nourished. No distress.  HENT:  Head: Normocephalic and atraumatic.  Neck: No JVD present.  Cardiovascular: Normal rate and regular rhythm.   No murmur heard. Pulmonary/Chest: She has no rales.  Abdominal: Soft.  Musculoskeletal: She exhibits no edema.  Neurological: She is alert and oriented to person, place, and time.  Skin: Skin is warm and dry.  Psychiatric: She has a normal mood and affect.    ASSESSMENT:    1.  Coronary artery disease involving native coronary artery of native heart without angina pectoris   2. Bilateral carotid artery stenosis    PLAN:    In order of problems listed above:  1.  Coronary artery disease involving native coronary artery of native heart without angina pectoris  Cardiac catheterization in 2017 demonstrated moderate, nonobstructive CAD.  A lesion in the left circumflex was not hemodynamically significant based upon FFR.  She denies anginal symptoms.  We reviewed some of her recent high-resolution CT as it did mention coronary artery calcification.  Recent lipid panel with her primary care physician demonstrated an LDL of 89.  She is intolerant of statins but has been able to tolerate twice weekly Crestor.  Continue current dose of statin.  Continue aspirin.  2. Bilateral carotid artery stenosis Carotid Dopplers in 2017 demonstrated minimal bilateral plaque and follow-up as needed was recommended.  Continue aspirin, statin.   Dispo:  Return in about 6 months (around 06/13/2017) for Routine Follow Up, w/ [name], PA-C.   Medication Adjustments/Labs and Tests Ordered: Current medicines are reviewed at length with the patient today.  Concerns regarding medicines are outlined above.  Tests Ordered: Orders Placed This Encounter  Procedures  . EKG 12-Lead   Medication Changes: No orders of the defined types were placed in this encounter.   Signed, [name], PA-C  12/13/2016 11:03 AM    [facility] [place], [place], [place]  [address] Phone: [phone]; Fax: [phone]

## 2016-12-13 ENCOUNTER — Ambulatory Visit (INDEPENDENT_AMBULATORY_CARE_PROVIDER_SITE_OTHER): Payer: Medicare Other | Admitting: Physician Assistant

## 2016-12-13 ENCOUNTER — Encounter: Payer: Self-pay | Admitting: Physician Assistant

## 2016-12-13 VITALS — BP 110/58 | HR 62 | Ht 63.0 in | Wt 130.8 lb

## 2016-12-13 DIAGNOSIS — I2510 Atherosclerotic heart disease of native coronary artery without angina pectoris: Secondary | ICD-10-CM

## 2016-12-13 DIAGNOSIS — I6523 Occlusion and stenosis of bilateral carotid arteries: Secondary | ICD-10-CM | POA: Diagnosis not present

## 2016-12-13 NOTE — Patient Instructions (Addendum)
Medication Instructions:  No changes.   Labwork: None   Testing/Procedures: None   Follow-Up: [name], PA-C in 6 months. We will send out a reminder letter in about 3 3 months for you to call and make an appt  Any Other Special Instructions Will Be Listed Below (If Applicable).  If you need a refill on your cardiac medications before your next appointment, please call your pharmacy.

## 2016-12-19 ENCOUNTER — Other Ambulatory Visit: Payer: Self-pay | Admitting: Orthopedic Surgery

## 2016-12-19 DIAGNOSIS — M7121 Synovial cyst of popliteal space [Baker], right knee: Secondary | ICD-10-CM

## 2016-12-27 ENCOUNTER — Ambulatory Visit
Admission: RE | Admit: 2016-12-27 | Discharge: 2016-12-27 | Disposition: A | Discharge status: Home / Self Care | Payer: Medicare Other | Source: Ambulatory Visit | Attending: Orthopedic Surgery | Admitting: Orthopedic Surgery

## 2016-12-27 DIAGNOSIS — M7121 Synovial cyst of popliteal space [Baker], right knee: Secondary | ICD-10-CM

## 2016-12-28 ENCOUNTER — Ambulatory Visit (INDEPENDENT_AMBULATORY_CARE_PROVIDER_SITE_OTHER): Payer: Medicare Other | Admitting: Internal Medicine

## 2016-12-28 ENCOUNTER — Encounter: Payer: Self-pay | Admitting: Internal Medicine

## 2016-12-28 VITALS — BP 116/62 | HR 66 | Ht 63.0 in | Wt 132.4 lb

## 2016-12-28 DIAGNOSIS — K219 Gastro-esophageal reflux disease without esophagitis: Secondary | ICD-10-CM

## 2016-12-28 DIAGNOSIS — K5900 Constipation, unspecified: Secondary | ICD-10-CM

## 2016-12-28 DIAGNOSIS — R109 Unspecified abdominal pain: Secondary | ICD-10-CM

## 2016-12-28 DIAGNOSIS — I6523 Occlusion and stenosis of bilateral carotid arteries: Secondary | ICD-10-CM

## 2016-12-28 NOTE — Progress Notes (Signed)
HISTORY OF PRESENT ILLNESS:  [name] is a 82 y.o. female with multiple medical problems as listed below who presents today with a chief complaint of constipation and associated abdominal discomfort. She also has a history of GERD for which she takes omeprazole 40 mg daily. Patient has not been seen since July 2015 when she underwent colonoscopy. She was found to have a diminutive ascending colon polyp which was non-adenomatous. Otherwise normal exam. No routine follow-up recommended given favorable findings and her current age. She tells me that she did well until approximately 1 year ago when she developed problems with constipation. Associated with this is abdominal discomfort. Discomfort is relieved with defecation. She does have straining. Symptoms seem to be coincident with the initiation of mirabegron for urinary incontinence. She also has tramadol on her medication list but uses this infrequently. She has had no bleeding or rectal pain. She reports that her GERD symptoms are well controlled. No weight loss. Patient has tried over-the-counter Colace sparingly. She thinks this may have been somewhat helpful. She is concerned about taking laxatives regularly. Currently having 1 bowel movement for week, occasionally 2. Review of outside blood work from April 2018 finds a normal CBC with hemoglobin 14.4. Normal comprehensive metabolic panel including calcium of 9.1. Normal TSH. Prior radiology with barium esophagram demonstrated gastric polyp which was followed up on endoscopy 2012 with biopsies confirming benign fundic gland type  REVIEW OF SYSTEMS:  All non-GI ROS negative -wise stated in history of present illness except for fatigue, arthritis, sinus and allergy trouble, skin rash, urinary leakage  Past Medical History:  Diagnosis Date  . Allergy    SEASONAL  . Arthritis   . CAD (coronary artery disease)    a. Myoview 10/15 - normal EF 70% // Myoview 11/16: EF 75%, normal perfusion,  (there was no blood pressure drop at low level exercise with Lexiscan infusion), low risk study // c. LHC 3/17 - LAD irregs, oLCx 70 (neg FFR), mRCA 30, EF 55-65% >> med Rx  . Carotid stenosis    a. Carotid US 9/15 - Bilateral 1-39% ICA >> FU 2 years // b. Bilateral ICA 1-39 >> FU prn  . Esophageal reflux   . Hematochezia   . History of echocardiogram    a. Echo 10/15 - EF 60-65%, no RWMA  . HLD (hyperlipidemia)   . Hx of colonoscopy   . Osteoporosis   . Other chronic pulmonary heart diseases   . Paroxysmal supraventricular tachycardia ([place])   . Sarcoidosis     Past Surgical History:  Procedure Laterality Date  . arm surgery Right   . BLADDER SURGERY    . CARPAL TUNNEL RELEASE Left   . COLONOSCOPY    . FOOT SURGERY Right   . HIP SURGERY Right 2011   full replacement  . LEG SURGERY Left May 2014   femur fracture s/p open and closed reduction in [place], Dr. Jimmye Norman  . POLYPECTOMY    . SHOULDER ARTHROSCOPY W/ ROTATOR CUFF REPAIR Right   . TRAPEZIUM RESECTION Right     Social History RAZIA SCREWS  reports that  has never smoked. she has never used smokeless tobacco. She reports that she does not drink alcohol or use drugs.  family history includes Arrhythmia in her father; Cancer in her father and mother; Colon cancer (age of onset: 54) in her mother; Heart attack in her brother; Heart disease in her father; Lung cancer in her father.  Allergies  Allergen Reactions  .  Levaquin [Levofloxacin] Other (See Comments)    Leg pain .Marland Kitchen Per doctor not to take again  . Morphine Nausea Only  . Oxycodone-Acetaminophen     "talking out of my head"  . Percocet [Oxycodone-Acetaminophen] Nausea And Vomiting  . Sulfonamide Derivatives     Pt unsure       PHYSICAL EXAMINATION: Vital signs: BP 116/62   Pulse 66   Ht 5\' 3"  (1.6 m)   Wt 132 lb 6 oz (60 kg)   LMP 02/22/1983 (Approximate)   BMI 23.45 kg/m   Constitutional: generally well-appearing, no acute distress Psychiatric:  alert and oriented x3, cooperative Eyes: extraocular movements intact, anicteric, conjunctiva pink Mouth: oral pharynx moist, no lesions Neck: supple no lymphadenopathy Cardiovascular: heart regular rate and rhythm, no murmur Lungs: clear to auscultation bilaterally Abdomen: soft, nontender, nondistended, no obvious ascites, no peritoneal signs, normal bowel sounds, no organomegaly Rectal: Omitted Extremities: no clubbing, cyanosis, or lower extremity edema bilaterally Skin: no lesions on visible extremities Neuro: No focal deficits. Normal DTRs. Cranial nerves intact.   ASSESSMENT:  #1. Constipation and associated abdominal discomfort. Functional. Likely related to mirabegron and/or tramadol. #2. Family history of colon cancer and personal history of adenomatous colon polyps. Most recent examination 2015 as described. Aged out of routine surveillance #3. GERD. Doing well on omeprazole 40 mg daily   PLAN:  #1. Advised to take Colace 2 daily #2. This is insufficient, advised to take MiraLAX 1 scoop daily. Adjust as needed #3. Contact the office should her problems persist despite these measures #4. Reflux precautions #5. Continue omeprazole 40 mg daily  30 minutes spent face-to-face with the patient. Greater than 50% a time use for counseling regarding her problematic constipation with associated abdominal discomfort

## 2016-12-28 NOTE — Patient Instructions (Signed)
Take 2 colace daily (over the counter, pharmacist can help you find the generic) If this doesn't work, take Miralax daily If this doesn't work, you may take the laxative of choice  Please follow up as needed

## 2016-12-29 ENCOUNTER — Ambulatory Visit (INDEPENDENT_AMBULATORY_CARE_PROVIDER_SITE_OTHER): Payer: Medicare Other | Admitting: Podiatry

## 2016-12-29 ENCOUNTER — Encounter: Payer: Self-pay | Admitting: Podiatry

## 2016-12-29 DIAGNOSIS — L84 Corns and callosities: Secondary | ICD-10-CM | POA: Diagnosis not present

## 2016-12-29 DIAGNOSIS — L600 Ingrowing nail: Secondary | ICD-10-CM | POA: Diagnosis not present

## 2016-12-29 NOTE — Patient Instructions (Addendum)

## 2016-12-29 NOTE — Progress Notes (Signed)
Subjective:    Patient ID: [name], female   DOB: 82 y.o.   MRN: [id number]   HPI patient presents stating she has lesions on the bottom of her left foot that it's been painful and also has an ingrown toenail left big toe on the medial side it's very tender and makes wearing shoe gear difficult    ROS      Objective:  Physical Exam neurovascular status intact with incurvated nailbed left hallux medial border that's painful when pressed and also noted to have keratotic lesion sub-left foot     Assessment:  Ingrown toenail deformity left hallux medial border and callus formation plantar aspect left foot       Plan:   H&P condition reviewed and recommended correction of nail. I explained procedure and risk and today I infiltrated left hallux 60 mg like Marcaine mixture and remove the medial border exposing matrix and applying phenol 3 applications 30 seconds followed by alcohol lavaged sterile dressing. Debrided lesions on the plantar aspect left foot and reappoint to recheck

## 2017-01-02 ENCOUNTER — Ambulatory Visit (INDEPENDENT_AMBULATORY_CARE_PROVIDER_SITE_OTHER): Payer: Medicare Other | Admitting: Neurology

## 2017-01-02 ENCOUNTER — Encounter: Payer: Self-pay | Admitting: Neurology

## 2017-01-02 VITALS — BP 126/60 | HR 61 | Ht 63.0 in | Wt 130.0 lb

## 2017-01-02 DIAGNOSIS — G4710 Hypersomnia, unspecified: Secondary | ICD-10-CM | POA: Diagnosis not present

## 2017-01-02 DIAGNOSIS — I6523 Occlusion and stenosis of bilateral carotid arteries: Secondary | ICD-10-CM

## 2017-01-02 NOTE — Progress Notes (Signed)
[facility]    Provider:  Dr Jaynee Eagles Referring Provider: [name], MD Primary Care Physician:  [name], MD  CC:  Memory loss and fatigue  Interval history 01/02/2017: MMSE from 28/30 to 27/30. Still c/o memory changes and extreme fatigue. Discussed sleep study for her fatigue and frequent awakenings and memory issues, need sleep eval for OSA or PLMS.  If this is negative/normal will send for formal neurocognitive testing.   Interval history 08/31/2016: This is an absolutely lovely 82 year old female here for follow-up. She is having short-term memory loss. A few hours or the next day she forgets conversations or things she's been told. They called and invited her to their sister's birthday party, she couldn't remember the restaurant name. Started a few years ago and is worsening. No Fhx of dementia. She lives with daughter and son in [place]. 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  Patient snores heavily, she wakes up frequently to urinate, falls asleep during the day, her son-in-law heard her snoring through her door. She also has extreme fatigue during the day. Recommended sleep apnea evaluation.  HPI:  [name] is a 82 y.o. female for follow up offatigue and weakness and a new problem memory loss. Past medical history of hypertension, carotid bruit right, degenerative joint disease, fatigue, sarcoidosis, hyperlipidemia, left leg femur broken status-post rods, right hip replacement. She has significant fatigue. She complains of generalized weakness, fatigue, imbalance, sinking feelings like she is going to pass out, decreased energy, decreased smell and loss of taste, generally feeling bad. Extensive lab testing including CK, acetylcholine receptor antibodies, B1, multiple myeloma panel, heavy metals, rheumatoid factor, Sjogren's antibodies, ANA with reflex, sedimentation rate, angiotensin-converting enzyme, TSH,  methylmalonic acid, B12 and folate all normal. MRI of the brain showed Mild diffuse and moderate perisylvian atrophy and mild small vessel disease but no etiology for her symptoms. EMG/NCS today to evaluate for other neuromuscular disorders such as myopathy/myositis. Reviewed all results with patient today.   Reviewed notes, labs and imaging from outside physicians, which showed:  B12 283. This was drawn 12/07/2015. CMP showed elevated MCV 104, BUN 24, creatinine 0.8 otherwise unremarkable drawn 11/30/2015.   Patient was seen a Barista. Complaining of strange sensations in her head, like she is thinking, kind of weak, could not describe it. Feels like she is having "sinking spells. Feels that balance, has also been seen by cardiology in October 2017. She was evaluated for nonobstructive one-vessel coronary artery disease, is continuing aspirin and statin PSVT without recurrence on calcium channel blocker, blood pressure control, history of hyperlipidemia intolerant of statins however is using Crestor twice a week, follow-up Dopplers and carotid artery disease were ordered, also discussed fatigue after eating, she follows with pulmonology for history of sarcoidosis. She has arthritis      Social History   Socioeconomic History  . Marital status: Widowed    Spouse name: Not on file  . Number of children: 1  . Years of education: Not on file  . Highest education level: Not on file  Social Needs  . Financial resource strain: Not on file  . Food insecurity - worry: Not on file  . Food insecurity - inability: Not on file  . Transportation needs - medical: Not on file  . Transportation needs - non-medical: Not on file  Occupational History  . Occupation: retired    Comment: still works as Scientist, 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  Tobacco Use  . Smoking status: Never Smoker  . Smokeless tobacco: Never Used  Substance and Sexual Activity  . Alcohol use: No  . Drug  use: No  . Sexual activity: No    Partners: Male    Birth control/protection: Post-menopausal  Other Topics Concern  . Not on file  Social History Narrative   Lives with daughter and son in law   Caffeine use:  Coffee 1 per day   Right handed    Family History  Problem Relation Age of Onset  . Colon cancer Mother 9  . Cancer Mother   . Lung cancer Father   . Heart disease Father   . Arrhythmia Father   . Cancer Father   . Cancer Sister   . Heart attack Brother   . Esophageal cancer Neg Hx   . Rectal cancer Neg Hx   . Stomach cancer Neg Hx   . Stroke Neg Hx     Past Medical History:  Diagnosis Date  . Allergy    SEASONAL  . Arthritis   . Baker's cyst of knee, right   . CAD (coronary artery disease)    a. Myoview 10/15 - normal EF 70% // Myoview 11/16: EF 75%, normal perfusion, (there was no blood pressure drop at low level exercise with Lexiscan infusion), low risk study // c. LHC 3/17 - LAD irregs, oLCx 70 (neg FFR), mRCA 30, EF 55-65% >> med Rx  . Carotid stenosis    a. Carotid US 9/15 - Bilateral 1-39% ICA >> FU 2 years // b. Bilateral ICA 1-39 >> FU prn  . Esophageal reflux   . Hematochezia   . History of echocardiogram    a. Echo 10/15 - EF 60-65%, no RWMA  . HLD (hyperlipidemia)   . Hx of colonoscopy   . Osteoporosis   . Other chronic pulmonary heart diseases   . Paroxysmal supraventricular tachycardia ([place])   . Sarcoidosis     Past Surgical History:  Procedure Laterality Date  . arm surgery Right   . BLADDER SURGERY    . CARPAL TUNNEL RELEASE Left   . COLONOSCOPY    . FOOT SURGERY Right   . HIP SURGERY Right 2011   full replacement  . LEG SURGERY Left May 2014   femur fracture s/p open and closed reduction in [place], Dr. Jimmye Norman  . POLYPECTOMY    . SHOULDER ARTHROSCOPY W/ ROTATOR CUFF REPAIR Right   . TRAPEZIUM RESECTION Right     Current Outpatient Medications  Medication Sig Dispense Refill  . albuterol (PROVENTIL HFA;VENTOLIN HFA) 108  (90 Base) MCG/ACT inhaler Inhale 2 puffs into the lungs every 4 (four) hours as needed for wheezing or shortness of breath. 1 Inhaler 6  . aspirin EC 81 MG tablet Take 1 tablet (81 mg total) by mouth daily.    . calcium carbonate (TUMS - DOSED IN MG ELEMENTAL CALCIUM) 500 MG chewable tablet Chew 1 tablet by mouth daily. Pt isn't sure of dose.    Marland Kitchen CARTIA XT 180 MG 24 hr capsule TAKE 1 CAPSULE(180 MG) BY MOUTH DAILY 90 capsule 2  . fluticasone (FLONASE) 50 MCG/ACT nasal spray Place into both nostrils as needed for allergies or rhinitis.    Marland Kitchen loratadine (CLARITIN) 10 MG tablet Take 1 tablet (10 mg total) by mouth daily. 30 tablet 11  . MYRBETRIQ 50 MG TB24 tablet Take 50 mg by mouth daily.     . nitroGLYCERIN (NITROSTAT) 0.4 MG SL tablet Place 1 tablet (  0.4 mg total) under the tongue every 5 (five) minutes as needed for chest pain. 25 tablet 3  . omeprazole (PRILOSEC) 40 MG capsule Take 40 mg by mouth daily.     . rosuvastatin (CRESTOR) 10 MG tablet Take 1 tablet by mouth two times per week only 24 tablet 3  . traMADol (ULTRAM) 50 MG tablet Take 50 mg by mouth every 6 (six) hours as needed for severe pain.      No current facility-administered medications for this visit.     Allergies as of 01/02/2017 - Review Complete 01/02/2017  Allergen Reaction Noted  . Levaquin [levofloxacin] Other (See Comments) 06/10/2016  . Morphine Nausea Only 08/08/2008  . Oxycodone-acetaminophen  08/08/2008  . Percocet [oxycodone-acetaminophen] Nausea And Vomiting 12/26/2013  . Sulfonamide derivatives  08/08/2008    Vitals: Ht _0  (1.6 m)   Wt 130 lb (59 kg)   LMP 02/22/1983 (Approximate)   BMI 23.03 kg/m  Last Weight:  Wt Readings from Last 1 Encounters:  01/02/17 130 lb (59 kg)   Last Height:   Ht Readings from Last 1 Encounters:  01/02/17 _1  (1.6 m)     Cranial Nerves: The pupils are equal, round, and reactive to light. Visual fields are full to finger confrontation. Extraocular movements  are intact. Trigeminal sensation is intact and the muscles of mastication are normal. Left ptosis otherwise the face is symmetric. The palate elevates in the midline. Hearing intact. Voice is normal. Shoulder shrug is normal. The tongue has normal motion without fasciculations.   Coordination: Normal finger to nose and heel to shin.   Gait: Good stride, good arm swing, slightly antalgic   Motor Observation: Can get out of her seat without using hands with minimal difficulty, no asymmetry, no atrophy, and no involuntary movements noted.  Tone: Normal muscle tone.   Posture: Posture is normal. normal erect  Strength: 4/5 proximal uppers, 4/5 otherwise in the uppers 4-/5 proximal hip flexion Otherwise normal   Sensation: intact to LT  Reflex Exam:  DTR's: Deep tendon reflexes in the upper and lower extremities are symmetricalbilaterally.  Toes: The toes are equivocal bilaterally. (right toe upgoing vs previous foot surgery?) Clonus: Clonus is absent.     Assessment/Plan:Absolutely lovely 82 y.o. femalehere as a referral from Dr. Rudean Haskell fatigue and weakness. Today c/o memory changes. MMSE 28/30. Past medical history of hypertension, carotid bruit right, degenerative joint disease, fatigue, sarcoidosis, hyperlipidemia, left leg femur broken status post rods, right hip replacement. She has significant fatigue. She complains of generalized weakness, fatigue, imbalance, sinking feelings like she is going to pass out, decreased energy, decreased smell and loss of taste, generally feeling bad which is stable.  MMSE from 28/30 to 27/30. Still c/o memory changes and extreme fatigue. Discussed sleep study for her fatigue and frequent awakenings and memory issues, need sleep eval for OSA or PLMS.  If this is negative/normal will send for formal neurocognitive testing.   MRI brain to evaluate for intracranial etiologies of her symptoms such as  stroke or other lesions was unremarkable Emg/ncs was unremarkable Extensive labs Lab testing will be ordered including for neuromuscular disorders and peripheral neuropathic disorders unremarkable Physical therapy for strength training, gait and balance completed  Orders Placed This Encounter  Procedures  . Ambulatory referral to Sleep Studies    [name], MD  [facility] [address] [place], [place] [address]  Phone [phone] Fax [phone]  A total of 15 minutes was spent face-to-face with this  patient. Over half this time was spent on counseling patient on the excessive fatigue diagnosis and different diagnostic and therapeutic options available.

## 2017-01-03 ENCOUNTER — Ambulatory Visit (INDEPENDENT_AMBULATORY_CARE_PROVIDER_SITE_OTHER): Payer: Medicare Other | Admitting: Emergency Medicine

## 2017-01-03 ENCOUNTER — Encounter: Payer: Self-pay | Admitting: Emergency Medicine

## 2017-01-03 DIAGNOSIS — I6523 Occlusion and stenosis of bilateral carotid arteries: Secondary | ICD-10-CM | POA: Diagnosis not present

## 2017-01-03 DIAGNOSIS — D869 Sarcoidosis, unspecified: Secondary | ICD-10-CM | POA: Diagnosis not present

## 2017-01-03 DIAGNOSIS — J301 Allergic rhinitis due to pollen: Secondary | ICD-10-CM | POA: Diagnosis not present

## 2017-01-03 MED ORDER — ALBUTEROL SULFATE HFA 108 (90 BASE) MCG/ACT IN AERS
2.0000 | INHALATION_SPRAY | RESPIRATORY_TRACT | 6 refills | Status: DC | PRN
Start: 2017-01-03 — End: 2019-01-07

## 2017-01-03 NOTE — Assessment & Plan Note (Signed)
With associated obstructive lung disease.  She did not get much benefit from [employer].  I will have her use albuterol only as needed.  CT scan of the chest was reviewed and is stable compared with her priors.  No evidence of significant interstitial lung disease.

## 2017-01-03 NOTE — Patient Instructions (Signed)
We will not continue Stiolto. Continue loratadine once every day Keep fluticasone nasal spray available to use 2 sprays each nostril if needed for congestion, increased coughing or drainage We will start albuterol.  Use 2 puffs as needed for shortness of breath, wheezing.  You do not need to take this on a schedule. Flu shot up-to-date Follow with Dr [name] in 12 months or sooner if you have any problems

## 2017-01-03 NOTE — Progress Notes (Signed)
Subjective:    Patient ID: [name], female    DOB: 27-Nov-1934, 82 y.o.   MRN: [id number]  HPI 82 yo woman, never smoker, hx sarcoidosis that was dx by Dr Lucia Gaskins in [place] after FOB (? Whether she had biopsies or not), PSVT, GERD. She also had a hx severe PNA with empyema and chest tube (90's). She has DOE and difficulty walking. The decrease in function has come on gradually. PFT show obstruction with a DLCO defect.   PFT 12/06/13 >> severe AFL without BD response.          ROV 11/01/16 -- patient has a history of sarcoidosis, severe obstructive lung disease. She also has restrictive disease due to history of empyema. Allergic rhinitis, chronic cough. She was seen here in match w URI, AE-COPD. Had an allergic rxn to levaquin. Tells me that she has persistent nasal gtt, on loraradine. Uses flonase prn.  She has some daily cough, clear mucous. She is not on scheduled BD, tells me that her breathing is . She uses albuterol rarely. She tries to exercise several times a week.   ROV 01/03/17 --patient has a history of sarcoidosis, associated obstructive lung disease and restrictive lung disease, history of an empyema in the 1990s.  Chronic cough with allergic rhinitis.  We have performed a repeat CT scan of her chest 11/08/16 that I have reviewed.  This shows no significant change in her stable mild interstitial disease with associated bronchiectasis.  At her last visit we also did a trial of Stiolto.  She reports that she really didn't see much difference, didn't change her breathing very much. She uses fluticasone prn, is on loratadine.   Review of Systems  Constitutional: Negative for fever and unexpected weight change.  HENT: Negative for congestion, dental problem, ear pain, nosebleeds, postnasal drip, rhinorrhea, sinus pressure, sneezing, sore throat and trouble swallowing.   Eyes: Negative for redness and itching.  Respiratory: Negative for cough, chest tightness, shortness of breath and  wheezing.   Cardiovascular: Negative for palpitations and leg swelling.  Gastrointestinal: Negative for nausea and vomiting.  Genitourinary: Negative for dysuria.  Musculoskeletal: Negative for joint swelling.  Skin: Negative for rash.  Neurological: Negative for headaches.  Hematological: Does not bruise/bleed easily.  Psychiatric/Behavioral: Negative for dysphoric mood. The patient is not nervous/anxious.       Objective:   Physical Exam Vitals:   01/03/17 1435  BP: 110/70  Pulse: 72  SpO2: 94%  Weight: 130 lb (59 kg)  Height: 5\' 3"  (1.6 m)   Gen: Pleasant, kyphotic elderly woman, in no distress,  normal affect  ENT: No lesions,  mouth clear,  oropharynx clear, no postnasal drip  Neck: No JVD, no TMG, no carotid bruits  Lungs: No use of accessory muscles, clear without rales or rhonchi  Cardiovascular: RRR, heart sounds normal, no murmur or gallops, no peripheral edema  Musculoskeletal: No deformities, no cyanosis or clubbing  Neuro: alert, non focal  Skin: Warm, no lesions or rashes   03/23/16 --  COMPARISON:  12/02/2014 high-resolution chest CT.  FINDINGS: Cardiovascular: Normal heart size. No significant pericardial fluid/thickening. Left anterior descending, left circumflex and right coronary atherosclerosis. Atherosclerotic nonaneurysmal thoracic aorta. Normal caliber pulmonary arteries.  Mediastinum/Nodes: No discrete thyroid nodules. Unremarkable esophagus. No pathologically enlarged axillary, mediastinal or gross hilar lymph nodes, noting limited sensitivity for the detection of hilar adenopathy on this noncontrast study. Stable coarsely calcified subcentimeter right supraclavicular, bilateral paratracheal, AP window and subcarinal nodes.  Lungs/Pleura: No  pneumothorax. No pleural effusion. No acute consolidative airspace disease or lung masses. There is new mild patchy tree-in-bud opacity in the superior segment right lower lobe. There is stable  moderate cystic bronchiectasis with associated volume loss and distortion in the medial segment right middle lobe. There is scattered mild cylindrical and cystic bronchiectasis throughout the remaining right lung, not appreciably changed, with associated scattered mucoid impaction. There is stable mild parenchymal bands with associated mild volume loss and distortion in the posterior right upper lobe, superior segment and basilar right lower lobe. No new significant regions of bronchiectasis. No frank honeycombing. No significant air trapping on the expiration sequence.  Upper abdomen: Simple 1.3 cm inferior left liver lobe cyst. Simple 0.8 cm posterior interpolar right renal cyst.  Musculoskeletal: No aggressive appearing focal osseous lesions. Marked thoracic spondylosis.  IMPRESSION: 1. New mild patchy tree-in-bud opacity in the superior segment right lower lobe, most consistent with a mild nonspecific infectious or inflammatory bronchiolitis. 2. No appreciable change in the previously described findings of moderate cystic bronchiectasis in the medial segment right middle lobe, mild scattered bronchiectasis throughout the remaining right lung, and mild scarring in the right upper and right lower lobes. No evidence of progressive interstitial lung disease. 3. Aortic atherosclerosis. three-vessel coronary atherosclerosis.      Assessment & Plan:  SARCOIDOSIS, PULMONARY With associated obstructive lung disease.  She did not get much benefit from [employer].  I will have her use albuterol only as needed.  CT scan of the chest was reviewed and is stable compared with her priors.  No evidence of significant interstitial lung disease.  Allergic rhinitis Continue loratadine daily.  She has fluticasone nasal spray that she can use as needed.  Baltazar Apo, MD, PhD 01/03/2017, 2:51 PM [facility] [phone] or if no answer [phone]

## 2017-01-03 NOTE — Assessment & Plan Note (Signed)
Continue loratadine daily.  She has fluticasone nasal spray that she can use as needed.

## 2017-02-07 ENCOUNTER — Ambulatory Visit (INDEPENDENT_AMBULATORY_CARE_PROVIDER_SITE_OTHER): Payer: Medicare Other | Admitting: Neurology

## 2017-02-07 ENCOUNTER — Encounter: Payer: Self-pay | Admitting: Neurology

## 2017-02-07 VITALS — BP 112/60 | HR 71 | Ht 63.0 in | Wt 132.0 lb

## 2017-02-07 DIAGNOSIS — I471 Supraventricular tachycardia: Secondary | ICD-10-CM

## 2017-02-07 DIAGNOSIS — I2510 Atherosclerotic heart disease of native coronary artery without angina pectoris: Secondary | ICD-10-CM | POA: Diagnosis not present

## 2017-02-07 DIAGNOSIS — D869 Sarcoidosis, unspecified: Secondary | ICD-10-CM | POA: Diagnosis not present

## 2017-02-07 DIAGNOSIS — R0683 Snoring: Secondary | ICD-10-CM

## 2017-02-07 DIAGNOSIS — I10 Essential (primary) hypertension: Secondary | ICD-10-CM | POA: Diagnosis not present

## 2017-02-07 DIAGNOSIS — I6523 Occlusion and stenosis of bilateral carotid arteries: Secondary | ICD-10-CM | POA: Diagnosis not present

## 2017-02-07 NOTE — Progress Notes (Signed)
SLEEP MEDICINE CLINIC   Provider:  Larey Seat, M D  Primary Care Physician:  [name], MD   Referring Provider: [name], MD    Chief Complaint  Patient presents with  . New Patient (Initial Visit)    pt alone rm 10. pt struggles with being tired, wakes up 2-3 times during the night.     HPI:  [name] is a 82 y.o. widowed, caucasian , right handed  female , seen here as in a referral/ revisit  from Dr. Jaynee Eagles, who has seen the patient for memory decline and fatigue.  Dr Jaynee Eagles wrote the following :" Interval history 08/31/2016: This is an absolutely lovely 82 year old female here for follow-up. She is having short-term memory loss. A few hours or the next day she forgets conversations or things she's been told. They called and invited her to their sister's birthday party, she couldn't remember the restaurant name. Started a few years ago and is worsening. No Fhx of dementia. She lives with daughter and son in [place]. 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  Patient snores heavily, she wakes up frequently to urinate 4-5 times , falls asleep during the day, her son-in-law heard her snoring through her door. She also has extreme fatigue during the day. Recommended sleep apnea evaluation".  Dr Jaynee Eagles has started her on melatonin.  The patient begun taking 5 mg of melatonin in August of this year and has noted that she sleeps better, longer through the night.  She did not have trouble with initiating sleep to begin with.When she began taking melatonin she has noticed an increased frequency of dreams more vivid and lucid dreams but now this has passed. 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 Nott has a history of pulmonary sarcoidosis, she does get easily short of breath, she knows that she has been less exercise tolerant.  In addition she has been on diltiazem, a calcium channel blocker, for the treatment of sinus tachycardia.   Sleep habits are as follows: The  patient has variable habits, but she usually aims for a bedtime of around 11 PM is usually not difficult for her to fall asleep but she has multiple spells during the night when she wakes up that some related to nocturia others for unknown reasons.  She is prefers to sleep on her side but sometimes finds herself sleeping supine.  Her bedroom is described as cool, quiet and dark. She does not watch TV in the bedroom.  She does snore loudly and has been told so, she also may breathe irregularly.  She has not woken up air hungry or gasping for breath.  She denies palpitations, diaphoresis, vertigo, nausea or dizziness. She is on Diltiazem, Myrbetriq, and melatonin -she is taking her medication religiously.She usually wakes up spontaneously in the morning between 6 AM and 7.15 and usually concludes her night then.  She estimates that she will sleep for a total of 5 houts prior to melatonin, now perhaps 6 hours.    Sleep medical history and family sleep history: The patient has no history of parasomnia activity, sleepwalking, dream enactment. History of coronary AD- Dr. Burt Knack did an angioplasty,  She has pulmonary sarcoidosis, tachycardia.  She fell and broke her cheekbone at school. Daughter has some insomnia.    Social history: widowed- 2001, non smoker- non drinker, caffeine use: I cup of coffee in AM- one caffeine free soda , rarely drinks tea.  Retired Pharmacist, hospital- sub.  She used to  be at [employer] before.  Review of Systems: Out of a complete 14 system review, the patient complains of only the following symptoms, and all other reviewed systems are negative. Snoring.   Epworth score  4 , Fatigue severity score 37  , depression score n/a    Social History   Socioeconomic History  . Marital status: Widowed    Spouse name: Not on file  . Number of children: 1  . Years of education: Not on file  . Highest education level: Not on file  Social Needs  . Financial resource strain: Not on file   . Food insecurity - worry: Not on file  . Food insecurity - inability: Not on file  . Transportation needs - medical: Not on file  . Transportation needs - non-medical: Not on file  Occupational History  . Occupation: retired    Comment: still works as Scientist, 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  Tobacco Use  . Smoking status: Never Smoker  . Smokeless tobacco: Never Used  Substance and Sexual Activity  . Alcohol use: No  . Drug use: No  . Sexual activity: No    Partners: Male    Birth control/protection: Post-menopausal  Other Topics Concern  . Not on file  Social History Narrative   Lives with daughter and son in law   Caffeine use:  Coffee 1 per day   Right handed    Family History  Problem Relation Age of Onset  . Colon cancer Mother 25  . Cancer Mother   . Lung cancer Father   . Heart disease Father   . Arrhythmia Father   . Cancer Father   . Cancer Sister   . Heart attack Brother   . Esophageal cancer Neg Hx   . Rectal cancer Neg Hx   . Stomach cancer Neg Hx   . Stroke Neg Hx     Past Medical History:  Diagnosis Date  . Allergy    SEASONAL  . Arthritis   . Baker's cyst of knee, right   . CAD (coronary artery disease)    a. Myoview 10/15 - normal EF 70% // Myoview 11/16: EF 75%, normal perfusion, (there was no blood pressure drop at low level exercise with Lexiscan infusion), low risk study // c. LHC 3/17 - LAD irregs, oLCx 70 (neg FFR), mRCA 30, EF 55-65% >> med Rx  . Carotid stenosis    a. Carotid US 9/15 - Bilateral 1-39% ICA >> FU 2 years // b. Bilateral ICA 1-39 >> FU prn  . Esophageal reflux   . Hematochezia   . History of echocardiogram    a. Echo 10/15 - EF 60-65%, no RWMA  . HLD (hyperlipidemia)   . Hx of colonoscopy   . Osteoporosis   . Other chronic pulmonary heart diseases   . Paroxysmal supraventricular tachycardia ([facility])   . Sarcoidosis     Past Surgical History:  Procedure Laterality Date  . arm surgery Right   . BLADDER SURGERY    .  CARDIAC CATHETERIZATION N/A 05/20/2015   Procedure: Left Heart Cath and Coronary Angiography;  Surgeon: Sherren Mocha, MD;  Location: [facility];  Service: Cardiovascular;  Laterality: N/A;  . CARPAL TUNNEL RELEASE Left   . COLONOSCOPY    . FOOT SURGERY Right   . HIP SURGERY Right 2011   full replacement  . LEG SURGERY Left May 2014   femur fracture s/p open and closed reduction in [place], Dr. Jimmye Norman  .  POLYPECTOMY    . SHOULDER ARTHROSCOPY W/ ROTATOR CUFF REPAIR Right   . TRAPEZIUM RESECTION Right     Current Outpatient Medications  Medication Sig Dispense Refill  . albuterol (PROVENTIL HFA;VENTOLIN HFA) 108 (90 Base) MCG/ACT inhaler Inhale 2 puffs every 4 (four) hours as needed into the lungs for wheezing or shortness of breath. 1 Inhaler 6  . aspirin EC 81 MG tablet Take 1 tablet (81 mg total) by mouth daily.    . calcium carbonate (TUMS - DOSED IN MG ELEMENTAL CALCIUM) 500 MG chewable tablet Chew 1 tablet by mouth daily. Pt isn't sure of dose.    Marland Kitchen CARTIA XT 180 MG 24 hr capsule TAKE 1 CAPSULE(180 MG) BY MOUTH DAILY 90 capsule 2  . fluticasone (FLONASE) 50 MCG/ACT nasal spray Place into both nostrils as needed for allergies or rhinitis.    Marland Kitchen loratadine (CLARITIN) 10 MG tablet Take 1 tablet (10 mg total) by mouth daily. 30 tablet 11  . MYRBETRIQ 50 MG TB24 tablet Take 50 mg by mouth daily.     . nitroGLYCERIN (NITROSTAT) 0.4 MG SL tablet Place 1 tablet (0.4 mg total) under the tongue every 5 (five) minutes as needed for chest pain. 25 tablet 3  . omeprazole (PRILOSEC) 40 MG capsule Take 40 mg by mouth daily.     . rosuvastatin (CRESTOR) 10 MG tablet Take 1 tablet by mouth two times per week only 24 tablet 3  . traMADol (ULTRAM) 50 MG tablet Take 50 mg by mouth every 6 (six) hours as needed for severe pain.      No current facility-administered medications for this visit.     Allergies as of 02/07/2017 - Review Complete 02/07/2017  Allergen Reaction Noted  . Levaquin  [levofloxacin] Other (See Comments) 06/10/2016  . Morphine Nausea Only 08/08/2008  . Oxycodone-acetaminophen  08/08/2008  . Percocet [oxycodone-acetaminophen] Nausea And Vomiting 12/26/2013  . Sulfonamide derivatives  08/08/2008    Vitals: BP 112/60   Pulse 71   Ht 5\' 3"  (1.6 m)   Wt 132 lb (59.9 kg)   LMP 02/22/1983 (Approximate)   BMI 23.38 kg/m  Last Weight:  Wt Readings from Last 1 Encounters:  02/07/17 132 lb (59.9 kg)   URK:YHCW mass index is 23.38 kg/m.     Last Height:   Ht Readings from Last 1 Encounters:  02/07/17 5\' 3"  (1.6 m)    Physical exam:  General: The patient is awake, alert and appears not in acute distress. The patient is well groomed. Natural teeth- full.  Head: Normocephalic, atraumatic. Neck is supple. Mallampati 3, uvula leans to the left   neck circumference:15. Nasal airflow patent. Retrognathia is not seen.   Cardiovascular:  Regular rate and rhythm , without  murmurs or carotid bruit, and without distended neck veins. Respiratory: Lungs are clear to auscultation. Skin:  Without evidence of edema, or rash Trunk: BMI is 23- The patient's posture is stooped   Neurologic exam : The patient is awake and alert, oriented to place and time.    MOCA:No flowsheet data found. MMSE: MMSE - Mini Mental State Exam 01/02/2017 08/31/2016  Orientation to time 4 5  Orientation to Place 5 5  Registration 3 3  Attention/ Calculation 5 3  Recall 2 3  Language- name 2 objects 2 2  Language- repeat 1 1  Language- follow 3 step command 3 3  Language- read & follow direction 1 1  Write a sentence 1 1  Copy design 0 1  Total  score 27 28     Attention span & concentration ability appears impaired, the patient  repeated the same information during the interview.  Speech is fluent,  without  dysarthria, dysphonia or aphasia.  Mood and affect are appropriate. Cranial nerves: Pupils are equal and briskly reactive to light. Beginning cataract in left eye.   Extraocular movements  in vertical and horizontal planes intact and without nystagmus. Visual fields by finger perimetry are intact. Hearing to finger rub intact. Facial sensation intact to fine touch.Facial motor strength is symmetric and tongue and uvula move midline. Shoulder shrug was symmetrical. Motor exam: Normal tone, muscle bulk and symmetric strength in all extremities. Sensory:  Fine touch, pinprick and vibration were tested in all extremities. Proprioception tested in the upper extremities was normal. Coordination: Rapid alternating movements in the fingers/hands was normal. Deep tendon reflexes: in the  upper and lower extremities are symmetric and intact. Babinski maneuver deferred.    Assessment:  After physical and neurologic examination, review of laboratory studies,  Personal review of imaging studies, reports of other /same  Imaging studies, results of polysomnography and / or neurophysiology testing and pre-existing records as far as provided in visit., my assessment is   1) Mrs. Landgrebe reports more being bothered by fatigue than sleepiness, reflected in her fatigue severity score 37 points and her Epworth Sleepiness Scale endorsed at only 4 points.  She does not struggle all day trying not to fall asleep.  She also feels that her sleep was more fragmented before Dr. Jaynee Eagles asked her to take melatonin and that in the meantime she has had less nocturia and a more sustained sleep pattern.  By now she will get for sure 6 hours of sleep at night which was not always the case before.  She has been told that she snores by her daughter and son-in-law and since her snoring is irregular it is highly likely to be associated with sleep apnea.  I recommended today that we should do a sleep test to establish if apnea is present, and depending on the outcome of the test which therapy would be indicated.  Pure snoring without major apnea can be treated by a dental device and this patient still has all  her biological teeth, so she could use such a device. In order to treat her fatigue I asked her to continue taking a vitamin B complex especially vitamin B12 and vitamin D.  There are prescription medications for people that suffer from fatigue but they all can cause tachycardia, a  condition she already has and is controlled with diltiazem.   The patient was advised of the nature of the diagnosed disorder , the treatment options and the  risks for general health and wellness arising from not treating the condition.   I spent more than 40 minutes of face to face time with the patient.  Greater than 50% of time was spent in counseling and coordination of care. We have discussed the diagnosis and differential and I answered the patient's questions.    Plan:  Treatment plan and additional workup :  Sleep apnea, snoring - evaluate with PSG,  Continue melatonin.  Vit D 2000 units daily, and B 12 or B complex.  Nocturia - try to reduce sodas or tea. Sarcoidosis- reason for attended sleep study. Rule out hypoxemia.     Larey Seat, MD 62/83/1517, 6:16 PM  Certified in Neurology by ABPN Certified in Sleep Medicine by [facility] [address],  [place], [place] [address]

## 2017-02-07 NOTE — Patient Instructions (Signed)

## 2017-02-24 ENCOUNTER — Ambulatory Visit: Payer: Medicare Other | Admitting: Physician Assistant

## 2017-02-28 ENCOUNTER — Ambulatory Visit: Payer: Medicare Other | Admitting: Nurse Practitioner

## 2017-03-02 ENCOUNTER — Encounter: Payer: Self-pay | Admitting: Obstetrics and Gynecology

## 2017-03-02 ENCOUNTER — Other Ambulatory Visit: Payer: Self-pay

## 2017-03-02 ENCOUNTER — Ambulatory Visit (INDEPENDENT_AMBULATORY_CARE_PROVIDER_SITE_OTHER): Payer: Medicare Other | Admitting: Obstetrics and Gynecology

## 2017-03-02 VITALS — BP 128/60 | HR 84 | Resp 14 | Ht 63.0 in | Wt 133.0 lb

## 2017-03-02 DIAGNOSIS — N952 Postmenopausal atrophic vaginitis: Secondary | ICD-10-CM | POA: Diagnosis not present

## 2017-03-02 DIAGNOSIS — N3281 Overactive bladder: Secondary | ICD-10-CM | POA: Diagnosis not present

## 2017-03-02 DIAGNOSIS — R151 Fecal smearing: Secondary | ICD-10-CM

## 2017-03-02 DIAGNOSIS — Z01419 Encounter for gynecological examination (general) (routine) without abnormal findings: Secondary | ICD-10-CM

## 2017-03-02 DIAGNOSIS — N814 Uterovaginal prolapse, unspecified: Secondary | ICD-10-CM | POA: Diagnosis not present

## 2017-03-02 DIAGNOSIS — Z124 Encounter for screening for malignant neoplasm of cervix: Secondary | ICD-10-CM | POA: Diagnosis not present

## 2017-03-02 NOTE — Patient Instructions (Addendum)
EXERCISE AND DIET:  We recommended that you start or continue a regular exercise program for good health. Regular exercise means any activity that makes your heart beat faster and makes you sweat.  We recommend exercising at least 30 minutes per day at least 3 days a week, preferably 4 or 5.  We also recommend a diet low in fat and sugar.  Inactivity, poor dietary choices and obesity can cause diabetes, heart attack, stroke, and kidney damage, among others.    ALCOHOL AND SMOKING:  Women should limit their alcohol intake to no more than 7 drinks/beers/glasses of wine (combined, not each!) per week. Moderation of alcohol intake to this level decreases your risk of breast cancer and liver damage. And of course, no recreational drugs are part of a healthy lifestyle.  And absolutely no smoking or even second hand smoke. Most people know smoking can cause heart and lung diseases, but did you know it also contributes to weakening of your bones? Aging of your skin?  Yellowing of your teeth and nails?  CALCIUM AND VITAMIN D:  Adequate intake of calcium and Vitamin D are recommended.  The recommendations for exact amounts of these supplements seem to change often, but generally speaking 600 mg of calcium (either carbonate or citrate) and 800 units of Vitamin D per day seems prudent. Certain women may benefit from higher intake of Vitamin D.  If you are among these women, your doctor will have told you during your visit.    PAP SMEARS:  Pap smears, to check for cervical cancer or precancers,  have traditionally been done yearly, although recent scientific advances have shown that most women can have pap smears less often.  However, every woman still should have a physical exam from her gynecologist every year. It will include a breast check, inspection of the vulva and vagina to check for abnormal growths or skin changes, a visual exam of the cervix, and then an exam to evaluate the size and shape of the uterus and  ovaries.  And after 81 years of age, a rectal exam is indicated to check for rectal cancers. We will also provide age appropriate advice regarding health maintenance, like when you should have certain vaccines, screening for sexually transmitted diseases, bone density testing, colonoscopy, mammograms, etc.   MAMMOGRAMS:  All women over 40 years old should have a yearly mammogram. Many facilities now offer a "3D" mammogram, which may cost around $50 extra out of pocket. If possible,  we recommend you accept the option to have the 3D mammogram performed.  It both reduces the number of women who will be called back for extra views which then turn out to be normal, and it is better than the routine mammogram at detecting truly abnormal areas.    COLONOSCOPY:  Colonoscopy to screen for colon cancer is recommended for all women at age 50.  We know, you hate the idea of the prep.  We agree, BUT, having colon cancer and not knowing it is worse!!  Colon cancer so often starts as a polyp that can be seen and removed at colonscopy, which can quite literally save your life!  And if your first colonoscopy is normal and you have no family history of colon cancer, most women don't have to have it again for 10 years.  Once every ten years, you can do something that may end up saving your life, right?  We will be happy to help you get it scheduled when you are ready.    Be sure to check your insurance coverage so you understand how much it will cost.  It may be covered as a preventative service at no cost, but you should check your particular policy.    You can try metamucil for constipation.  Breast Self-Awareness Breast self-awareness means being familiar with how your breasts look and feel. It involves checking your breasts regularly and reporting any changes to your health care provider. Practicing breast self-awareness is important. A change in your breasts can be a sign of a serious medical problem. Being familiar with  how your breasts look and feel allows you to find any problems early, when treatment is more likely to be successful. All women should practice breast self-awareness, including women who have had breast implants. How to do a breast self-exam One way to learn what is normal for your breasts and whether your breasts are changing is to do a breast self-exam. To do a breast self-exam: Look for Changes  1. Remove all the clothing above your waist. 2. Stand in front of a mirror in a room with good lighting. 3. Put your hands on your hips. 4. Push your hands firmly downward. 5. Compare your breasts in the mirror. Look for differences between them (asymmetry), such as: ? Differences in shape. ? Differences in size. ? Puckers, dips, and bumps in one breast and not the other. 6. Look at each breast for changes in your skin, such as: ? Redness. ? Scaly areas. 7. Look for changes in your nipples, such as: ? Discharge. ? Bleeding. ? Dimpling. ? Redness. ? A change in position. Feel for Changes  Carefully feel your breasts for lumps and changes. It is best to do this while lying on your back on the floor and again while sitting or standing in the shower or tub with soapy water on your skin. Feel each breast in the following way:  Place the arm on the side of the breast you are examining above your head.  Feel your breast with the other hand.  Start in the nipple area and make  inch (2 cm) overlapping circles to feel your breast. Use the pads of your three middle fingers to do this. Apply light pressure, then medium pressure, then firm pressure. The light pressure will allow you to feel the tissue closest to the skin. The medium pressure will allow you to feel the tissue that is a little deeper. The firm pressure will allow you to feel the tissue close to the ribs.  Continue the overlapping circles, moving downward over the breast until you feel your ribs below your breast.  Move one finger-width  toward the center of the body. Continue to use the  inch (2 cm) overlapping circles to feel your breast as you move slowly up toward your collarbone.  Continue the up and down exam using all three pressures until you reach your armpit.  Write Down What You Find  Write down what is normal for each breast and any changes that you find. Keep a written record with breast changes or normal findings for each breast. By writing this information down, you do not need to depend only on memory for size, tenderness, or location. Write down where you are in your menstrual cycle, if you are still menstruating. If you are having trouble noticing differences in your breasts, do not get discouraged. With time you will become more familiar with the variations in your breasts and more comfortable with the exam. How often should  I examine my breasts? Examine your breasts every month. If you are breastfeeding, the best time to examine your breasts is after a feeding or after using a breast pump. If you menstruate, the best time to examine your breasts is 5-7 days after your period is over. During your period, your breasts are lumpier, and it may be more difficult to notice changes. When should I see my health care provider? See your health care provider if you notice:  A change in shape or size of your breasts or nipples.  A change in the skin of your breast or nipples, such as a reddened or scaly area.  Unusual discharge from your nipples.  A lump or thick area that was not there before.  Pain in your breasts.  Anything that concerns you.  This information is not intended to replace advice given to you by your health care provider. Make sure you discuss any questions you have with your health care provider. Document Released: 02/07/2005 Document Revised: 07/16/2015 Document Reviewed: 12/28/2014 Elsevier Interactive Patient Education  2018 [employer].  Kegel Exercises Kegel exercises help strengthen  the muscles that support the rectum, vagina, small intestine, bladder, and uterus. Doing Kegel exercises can help:  Improve bladder and bowel control.  Improve sexual response.  Reduce problems and discomfort during pregnancy.  Kegel exercises involve squeezing your pelvic floor muscles, which are the same muscles you squeeze when you try to stop the flow of urine. The exercises can be done while sitting, standing, or lying down, but it is best to vary your position. Phase 1 exercises 1. Squeeze your pelvic floor muscles tight. You should feel a tight lift in your rectal area. If you are a female, you should also feel a tightness in your vaginal area. Keep your stomach, buttocks, and legs relaxed. 2. Hold the muscles tight for up to 10 seconds. 3. Relax your muscles. Repeat this exercise 50 times a day or as many times as told by your health care provider. Continue to do this exercise for at least 4-6 weeks or for as long as told by your health care provider. This information is not intended to replace advice given to you by your health care provider. Make sure you discuss any questions you have with your health care provider. Document Released: 01/25/2012 Document Revised: 10/03/2015 Document Reviewed: 12/28/2014 Elsevier Interactive Patient Education  [employer].

## 2017-03-02 NOTE — Progress Notes (Signed)
83 y.o. G1P1001 WidowedCaucasianF here for annual exam.   Her 57 year old sister was just diagnosed with ovarian/peritoneal cancer.  She has issues with constipation. Her GI MD told her to take a stool softener. She notices fecal staining. She has a BM 1-3 days, she has staining only the days she has a BM.  She is on myrbetriq for OAB, she only has trouble at night. Typically leaks 2-3 x a night when she gets up to void. She can leak small to large amounts. She isn't sure if the medication is working.  No vaginal bleeding, not sexually active.     Patient's last menstrual period was 02/22/1983 (approximate).          Sexually active: No.  The current method of family planning is post menopausal status.    Exercising: Yes.    walking Smoker:  no  Health Maintenance: Pap:  02-24-16 WNL 12-02-13 WNL  History of abnormal Pap:  no MMG:  04-07-16 U/S WNL  Colonoscopy:  09-18-13 polyps  BMD:  05/07/13, T Score -3.8 Left Forearm Total / 13.5 Right Forearm Total / 0.3 Spine Total (hips not calculated due to hardware) followed by PCP. Thinks she had a DEXA in 2018.  TDaP:  01-14-12 Gardasil: N/A   reports that  has never smoked. she has never used smokeless tobacco. She reports that she does not drink alcohol or use drugs.  Past Medical History:  Diagnosis Date  . Allergy    SEASONAL  . Arthritis   . Baker's cyst of knee, right   . CAD (coronary artery disease)    a. Myoview 10/15 - normal EF 70% // Myoview 11/16: EF 75%, normal perfusion, (there was no blood pressure drop at low level exercise with Lexiscan infusion), low risk study // c. LHC 3/17 - LAD irregs, oLCx 70 (neg FFR), mRCA 30, EF 55-65% >> med Rx  . Carotid stenosis    a. Carotid US 9/15 - Bilateral 1-39% ICA >> FU 2 years // b. Bilateral ICA 1-39 >> FU prn  . Esophageal reflux   . Hematochezia   . History of echocardiogram    a. Echo 10/15 - EF 60-65%, no RWMA  . HLD (hyperlipidemia)   . Hx of colonoscopy   . Osteoporosis   .  Other chronic pulmonary heart diseases   . Paroxysmal supraventricular tachycardia ([place])   . Sarcoidosis     Past Surgical History:  Procedure Laterality Date  . arm surgery Right   . BLADDER SURGERY    . CARDIAC CATHETERIZATION N/A 05/20/2015   Procedure: Left Heart Cath and Coronary Angiography;  Surgeon: Sherren Mocha, MD;  Location: [facility];  Service: Cardiovascular;  Laterality: N/A;  . CARPAL TUNNEL RELEASE Left   . COLONOSCOPY    . FOOT SURGERY Right   . HIP SURGERY Right 2011   full replacement  . LEG SURGERY Left May 2014   femur fracture s/p open and closed reduction in [place], Dr. Jimmye Norman  . POLYPECTOMY    . SHOULDER ARTHROSCOPY W/ ROTATOR CUFF REPAIR Right   . TRAPEZIUM RESECTION Right     Current Outpatient Medications  Medication Sig Dispense Refill  . albuterol (PROVENTIL HFA;VENTOLIN HFA) 108 (90 Base) MCG/ACT inhaler Inhale 2 puffs every 4 (four) hours as needed into the lungs for wheezing or shortness of breath. 1 Inhaler 6  . aspirin EC 81 MG tablet Take 1 tablet (81 mg total) by mouth daily.    . calcium carbonate (  TUMS - DOSED IN MG ELEMENTAL CALCIUM) 500 MG chewable tablet Chew 1 tablet by mouth daily. Pt isn't sure of dose.    Marland Kitchen CARTIA XT 180 MG 24 hr capsule TAKE 1 CAPSULE(180 MG) BY MOUTH DAILY 90 capsule 2  . fluticasone (FLONASE) 50 MCG/ACT nasal spray Place into both nostrils as needed for allergies or rhinitis.    Marland Kitchen loratadine (CLARITIN) 10 MG tablet Take 1 tablet (10 mg total) by mouth daily. 30 tablet 11  . MYRBETRIQ 50 MG TB24 tablet Take 50 mg by mouth daily.     . nitroGLYCERIN (NITROSTAT) 0.4 MG SL tablet Place 1 tablet (0.4 mg total) under the tongue every 5 (five) minutes as needed for chest pain. 25 tablet 3  . omeprazole (PRILOSEC) 40 MG capsule Take 40 mg by mouth daily.     . rosuvastatin (CRESTOR) 10 MG tablet Take 1 tablet by mouth two times per week only 24 tablet 3  . traMADol (ULTRAM) 50 MG tablet Take 50 mg by mouth every  6 (six) hours as needed for severe pain.      No current facility-administered medications for this visit.     Family History  Problem Relation Age of Onset  . Colon cancer Mother 74  . Cancer Mother   . Lung cancer Father   . Heart disease Father   . Arrhythmia Father   . Cancer Father   . Cancer Sister   . Heart attack Brother   . Esophageal cancer Neg Hx   . Rectal cancer Neg Hx   . Stomach cancer Neg Hx   . Stroke Neg Hx   Sister with ovarian cancer at 3 Mom with colon cancer at 15 Dad with lung cancer  Review of Systems  Constitutional: Negative.   HENT: Negative.   Eyes: Negative.   Respiratory: Negative.   Cardiovascular: Negative.   Gastrointestinal: Positive for constipation.  Endocrine: Negative.   Genitourinary: Negative.        Night urination   Musculoskeletal: Negative.   Skin: Negative.   Allergic/Immunologic: Negative.   Neurological: Negative.   Psychiatric/Behavioral: Negative.     Exam:   BP 128/60 (BP Location: Left Arm, Patient Position: Sitting, Cuff Size: Normal)   Pulse 84   Resp 14   Ht 5\' 3"  (1.6 m)   Wt 133 lb (60.3 kg)   LMP 02/22/1983 (Approximate)   BMI 23.56 kg/m   Weight change: @WEIGHTCHANGE @ Height:   Height: 5\' 3"  (160 cm)  Ht Readings from Last 3 Encounters:  03/02/17 5\' 3"  (1.6 m)  02/07/17 5\' 3"  (1.6 m)  01/03/17 5\' 3"  (1.6 m)    General appearance: alert, cooperative and appears stated age Head: Normocephalic, without obvious abnormality, atraumatic Neck: no adenopathy, supple, symmetrical, trachea midline and thyroid normal to inspection and palpation Lungs: clear to auscultation bilaterally Cardiovascular: regular rate and rhythm Breasts: normal appearance, no masses or tenderness Abdomen: soft, non-tender; non distended,  no masses,  no organomegaly Extremities: extremities normal, atraumatic, no cyanosis or edema Skin: Skin color, texture, turgor normal. No rashes or lesions Lymph nodes: Cervical,  supraclavicular, and axillary nodes normal. No abnormal inguinal nodes palpated Neurologic: Grossly normal   Pelvic: External genitalia:  no lesions              Urethra:  normal appearing urethra with no masses, tenderness or lesions              Bartholins and Skenes: normal  Vagina: normal appearing vagina with normal color and discharge, no lesions              Cervix: no lesions   She has a grade 2 uterine prolapse, comes to 1 cm above the introitus (not symptomatic)               Bimanual Exam:  Uterus:  normal size, contour, position, consistency, mobility, non-tender              Adnexa: no mass, fullness, tenderness               Rectovaginal: Confirms               Anus:  normal sphincter tone, no lesions, no tags  Kegel strength 2/5  Chaperone was present for exam.  A:  Well Woman with normal exam  Sister with ovarian cancer, diagnosed at 3  Constipation, some fecal staining  OAB, only bothers her at night, on myrbetriq (not sure it's helping)  Uterine prolapse, not symptomatic    P:   No pap this year  Mammogram next month  Colonoscopy UTD  Primary is treating her for osteoporosis  Try metamucil  Discussed breast self exam  Discussed calcium/vit d  Discussed kegels  She will try going off of her myrbetriq, we discussed stopping and kegeling if she has a strong urge and then walking to the bathroom  She will call if she wants to try a different medication

## 2017-03-20 ENCOUNTER — Ambulatory Visit (INDEPENDENT_AMBULATORY_CARE_PROVIDER_SITE_OTHER): Payer: Medicare Other | Admitting: Neurology

## 2017-03-20 DIAGNOSIS — G4733 Obstructive sleep apnea (adult) (pediatric): Secondary | ICD-10-CM | POA: Diagnosis not present

## 2017-03-20 DIAGNOSIS — R0683 Snoring: Secondary | ICD-10-CM

## 2017-03-20 DIAGNOSIS — I471 Supraventricular tachycardia, unspecified: Secondary | ICD-10-CM

## 2017-03-20 DIAGNOSIS — D869 Sarcoidosis, unspecified: Secondary | ICD-10-CM

## 2017-03-20 DIAGNOSIS — I2510 Atherosclerotic heart disease of native coronary artery without angina pectoris: Secondary | ICD-10-CM

## 2017-03-20 DIAGNOSIS — I10 Essential (primary) hypertension: Secondary | ICD-10-CM

## 2017-03-27 NOTE — Procedures (Signed)
PATIENT'S NAME:  [name], [name] DOB:      Nov 25, 1934      MR#:    [id number]     DATE OF RECORDING: 03/20/2017 REFERRING M.D.:  [name], MD; [name], MD Study Performed:   Baseline Polysomnogram HISTORY: This  83 year old widowed Caucasian right handed female, is seen here in a referral by Dr. Jaynee Eagles, who has seen the patient for memory decline and fatigue.  She does snore loudly and has been told so, she also may breathe irregularly.  Pt reports more being bothered by fatigue more than sleepiness, reflected in her fatigue severity score 37 points. The patient endorsed the Epworth Sleepiness Scale at 4 points.    The patient's weight 132 pounds with a height of 63 (inches), resulting in a BMI of 23.4 kg/m2. The patient's neck circumference measured 15 inches.  CURRENT MEDICATIONS: Proventil inhaler, Cartia XT, Flonase, Claritin, Myrbetriq, Nitrostat, Prilosec, Crestor, Ultram   PROCEDURE:  This is a multichannel digital polysomnogram utilizing the Somnostar 11.2 system.  Electrodes and sensors were applied and monitored per AASM Specifications.   EEG, EOG, Chin and Limb EMG, were sampled at 200 Hz.  ECG, Snore and Nasal Pressure, Thermal Airflow, Respiratory Effort, CPAP Flow and Pressure, Oximetry was sampled at 50 Hz. Digital video and audio were recorded.      BASELINE STUDY Lights Out was at 22:32 and Lights On at 05:11.  Total recording time (TRT) was 399.5 minutes, with a total sleep time (TST) of 337 minutes. The patient's sleep latency was 28.5 minutes. REM latency was 73.5 minutes. The sleep efficiency was 84.4 %.     SLEEP ARCHITECTURE: WASO (Wake after sleep onset) was 34 minutes.  There were 8.5 minutes in Stage N1, 221.5 minutes Stage N2, 66 minutes Stage N3 and 41 minutes in Stage REM.  The percentage of Stage N1 was 2.5%, Stage N2 was 65.7%, Stage N3 was 19.6% and Stage R (REM sleep) was 12.2%.   RESPIRATORY ANALYSIS:  There were a total of 53 respiratory events:  5  obstructive apneas, 0 central apneas and 48 hypopneas with 0 respiratory event related arousals (RERAs).     The total APNEA/HYPOPNEA INDEX (AHI) was 9.4/hour and the total RESPIRATORY DISTURBANCE INDEX was 9.4 /hour. 31 events occurred in REM sleep and 42 events in NREM. The REM AHI was 45.4 /hour, versus a non-REM AHI of 4.5. The patient spent 0 minutes of total sleep time in the supine position.  OXYGEN SATURATION & C02:  The Wake baseline 02 saturation was 95%, with the lowest being 83%. Time spent below 89% saturation equaled 9 minutes.   PERIODIC LIMB MOVEMENTS:  The patient had a total of 33 Periodic Limb Movements.  The Periodic Limb Movement (PLM) index was 5.9 and the PLM Arousal index was 0.4/hour.  The arousals were noted as: 35 were spontaneous, 2 were associated with PLMs, and 53 were associated with respiratory events. Audio and video analysis did not show any abnormal or unusual movements, behaviors, phonations or vocalizations. Mild snoring was noted. No nocturia. EKG was in keeping with normal sinus rhythm (NSR).   Post-study, the patient indicated that sleep was the same as usual.    IMPRESSION:  1. Mild Obstructive Sleep Apnea (OSA) with an AHI of 9.4/hr. There was clear REM accentuation with AHI 48.  2. Periodic Limb Movement Disorder (PLMD) with very few arousals.   RECOMMENDATIONS: There was no correlation between the clinical chief complaint of headaches, the mild degree  of apnea and lack of hypoxemia.      1. Treatment of such mild apnea is optional - CPAP should be discussed, but is not mandatory. Further information regarding OSA may be obtained from [employer] (www.sleepfoundation.org) or American Sleep Apnea Association (www.sleepapnea.org). 2. Consider dedicated sleep psychology referral if insomnia is of clinical concern.   3. A follow up appointment will be scheduled in the Sleep Clinic at [facility]. The referring provider  will be notified of the results.      I certify that I have reviewed the entire raw data recording prior to the issuance of this report in accordance with the Standards of Accreditation of the American Academy of Sleep Medicine (AASM)      Larey Seat, MD    03-27-2017 Diplomat, American Board of Psychiatry and Neurology  Diplomat, American Board of [place] Director, [employer] [facility] [employer]

## 2017-03-28 ENCOUNTER — Telehealth: Payer: Self-pay | Admitting: Neurology

## 2017-03-28 NOTE — Telephone Encounter (Signed)
I called pt. I advised pt that Dr. Brett Fairy reviewed pt's sleep study and found that pt has mild apnea. Dr. Brett Fairy doesn't feel that her apnea would require intervention with CPAP. She states that the pt didn't have any issues with low oxygen or cardiac irregularities. She didn't feel there was any correlation related to the headaches. Pt verbalized understanding. I offered a follow up apt to discuss further, pt declined at this time.

## 2017-03-28 NOTE — Telephone Encounter (Signed)
-----   Message from Larey Seat, MD sent at 03/27/2017 12:44 PM EST ----- There is such mild apnea that I do not see a clear need for CPAP use, but would offer CPAP if the patient is interested.

## 2017-04-17 ENCOUNTER — Telehealth: Payer: Self-pay | Admitting: Emergency Medicine

## 2017-04-17 NOTE — Telephone Encounter (Signed)
Spoke with patient. She was requesting an appt with RB today. She only wanted to see RB. Advised patient that RB was fully booked. Check-in offered her an appt for Thursday with RB at 1145.   Per patient, she has been congested and coughing up green phlegm since last week. She went to an UC and was prescribed prednisone. She is still not feeling well. She also had several questions in regards to her CT scan and if she needed another one. She stated that she will wait until Thursday to ask RB these questions.   Nothing else needed at time of call.

## 2017-04-18 ENCOUNTER — Ambulatory Visit (INDEPENDENT_AMBULATORY_CARE_PROVIDER_SITE_OTHER)
Admission: RE | Admit: 2017-04-18 | Discharge: 2017-04-18 | Disposition: A | Discharge status: Home / Self Care | Payer: Medicare Other | Source: Ambulatory Visit | Attending: Pulmonary Disease | Admitting: Pulmonary Disease

## 2017-04-18 ENCOUNTER — Ambulatory Visit (INDEPENDENT_AMBULATORY_CARE_PROVIDER_SITE_OTHER): Payer: Medicare Other | Admitting: Pulmonary Disease

## 2017-04-18 ENCOUNTER — Encounter: Payer: Self-pay | Admitting: Pulmonary Disease

## 2017-04-18 VITALS — BP 144/68 | HR 73 | Temp 98.7°F | Ht 63.0 in | Wt 130.0 lb

## 2017-04-18 DIAGNOSIS — J471 Bronchiectasis with (acute) exacerbation: Secondary | ICD-10-CM

## 2017-04-18 DIAGNOSIS — D869 Sarcoidosis, unspecified: Secondary | ICD-10-CM

## 2017-04-18 DIAGNOSIS — I2510 Atherosclerotic heart disease of native coronary artery without angina pectoris: Secondary | ICD-10-CM | POA: Diagnosis not present

## 2017-04-18 MED ORDER — FLUTTER DEVI: 0 refills | Status: DC

## 2017-04-18 MED ORDER — AMOXICILLIN-POT CLAVULANATE 875-125 MG PO TABS: 1.0000 | ORAL_TABLET | Freq: Two times a day (BID) | ORAL | 0 refills | Status: DC

## 2017-04-18 NOTE — Progress Notes (Signed)
Subjective:    Patient ID: [name], female    DOB: 03/01/1934, 83 y.o.   MRN: [id number]   RB Patient with bronchiectasis and sarcoidosis  HPI  Chief Complaint  Patient presents with  . Acute Visit    RB pt being treated for sarcoidosis>> c/o sinus and ear congestion, fatigue, prod cough with yellow to clear mucus.  s/s present X 1 week.    [name] presents today with 1 week of mucus production, shortness of breath, fatigue, and chest congestion.  She says that the cough is severe and keeps her up in the evenings.  She went to urgent care last week and she was prescribed prednisone which she said did not help.  She has used her albuterol nebulizer at home.  The mucus is yellow to green in color.  No recent fevers or chills.  She did have a sick contact and that her son-in-law was sick about 2 weeks ago.  No weight loss recently.  She has been compliant with her other medications.  Past Medical History:  Diagnosis Date  . Allergy    SEASONAL  . Arthritis   . Baker's cyst of knee, right   . CAD (coronary artery disease)    a. Myoview 10/15 - normal EF 70% // Myoview 11/16: EF 75%, normal perfusion, (there was no blood pressure drop at low level exercise with Lexiscan infusion), low risk study // c. LHC 3/17 - LAD irregs, oLCx 70 (neg FFR), mRCA 30, EF 55-65% >> med Rx  . Carotid stenosis    a. Carotid US 9/15 - Bilateral 1-39% ICA >> FU 2 years // b. Bilateral ICA 1-39 >> FU prn  . Esophageal reflux   . Hematochezia   . History of echocardiogram    a. Echo 10/15 - EF 60-65%, no RWMA  . HLD (hyperlipidemia)   . Hx of colonoscopy   . Osteoporosis   . Other chronic pulmonary heart diseases   . Paroxysmal supraventricular tachycardia ([place])   . Sarcoidosis      Review of Systems  Constitutional: Positive for fatigue. Negative for chills and fever.  HENT: Negative for postnasal drip, rhinorrhea, sinus pressure and sore throat.   Respiratory: Positive for cough, chest  tightness, shortness of breath and wheezing.   Cardiovascular: Negative for chest pain, palpitations and leg swelling.       Objective:   Physical Exam Vitals:   04/18/17 1212  BP: (!) 144/68  Pulse: 73  Temp: 98.7 F (37.1 C)  TempSrc: Oral  SpO2: 95%  Weight: 130 lb (59 kg)  Height: 5\' 3"  (1.6 m)   RA  Gen: mildly ill appearing, coughing frequently HENT: OP clear, TM's clear, neck supple PULM: wheezing bilaterally B, normal percussion CV: RRR, no mgr, trace edema GI: BS+, soft, nontender Derm: no cyanosis or rash Psyche: normal mood and affect  Records from prior visit with my partner reviewed where she has been treated for bronchiectasis and asthma  September 2019 CT chest images independently reviewed showing right middle lobe bronchiectasis     Assessment & Plan:   SARCOIDOSIS, PULMONARY - Plan: DG Chest 2 View  Bronchiectasis with acute exacerbation ([place]) - Plan: DG Chest 2 View  Discussion: [name] has an exacerbation of bronchiectasis in the setting of known sarcoidosis.  She is having more wheezing than I anticipated but she says she would prefer to avoid prednisone.  I have explained to her that she needs to cough out more mucus  over the next few weeks so we need to prescribe medicine to help with this.  She also needs an antibiotic.  Plan: Acute bronchiectasis exacerbation Acute exacerbation of bronchiectasis with known sarcoidosis: Take Augmentin 875 twice a day for the next 10 days Take a probiotic like yogurt when you are taking the antibiotic Drink plenty of fluids Use a flutter valve at least twice a day, 10 breaths each time Take Mucinex long-acting 1200 mg twice a day Keep using your inhaled medicines as you are doing Do not try to suppress your cough for at least a week, you need to cough out the mucus CXR today  Call us if you are not getting better  Keep your previously scheduled appointment with Dr. [name]     Current Outpatient  Medications:  .  albuterol (PROVENTIL HFA;VENTOLIN HFA) 108 (90 Base) MCG/ACT inhaler, Inhale 2 puffs every 4 (four) hours as needed into the lungs for wheezing or shortness of breath., Disp: 1 Inhaler, Rfl: 6 .  aspirin EC 81 MG tablet, Take 1 tablet (81 mg total) by mouth daily., Disp: , Rfl:  .  calcium carbonate (TUMS - DOSED IN MG ELEMENTAL CALCIUM) 500 MG chewable tablet, Chew 1 tablet by mouth daily. Pt isn't sure of dose., Disp: , Rfl:  .  CARTIA XT 180 MG 24 hr capsule, TAKE 1 CAPSULE(180 MG) BY MOUTH DAILY, Disp: 90 capsule, Rfl: 2 .  denosumab (PROLIA) 60 MG/ML SOLN injection, Inject 60 mg into the skin every 6 (six) months. Administer in upper arm, thigh, or abdomen, Disp: , Rfl:  .  fluticasone (FLONASE) 50 MCG/ACT nasal spray, Place into both nostrils as needed for allergies or rhinitis., Disp: , Rfl:  .  loratadine (CLARITIN) 10 MG tablet, Take 1 tablet (10 mg total) by mouth daily., Disp: 30 tablet, Rfl: 11 .  MYRBETRIQ 50 MG TB24 tablet, Take 50 mg by mouth daily. , Disp: , Rfl:  .  nitroGLYCERIN (NITROSTAT) 0.4 MG SL tablet, Place 1 tablet (0.4 mg total) under the tongue every 5 (five) minutes as needed for chest pain., Disp: 25 tablet, Rfl: 3 .  omeprazole (PRILOSEC) 40 MG capsule, Take 40 mg by mouth daily. , Disp: , Rfl:  .  rosuvastatin (CRESTOR) 10 MG tablet, Take 1 tablet by mouth two times per week only, Disp: 24 tablet, Rfl: 3 .  traMADol (ULTRAM) 50 MG tablet, Take 50 mg by mouth every 6 (six) hours as needed for severe pain. , Disp: , Rfl:  .  Vitamin D, Ergocalciferol, (DRISDOL) 50000 units CAPS capsule, Take 50,000 Units by mouth every 7 (seven) days., Disp: , Rfl:

## 2017-04-18 NOTE — Patient Instructions (Signed)
Acute exacerbation of bronchiectasis with known sarcoidosis: Take Augmentin 875 twice a day for the next 10 days Take a probiotic like yogurt when you are taking the antibiotic Drink plenty of fluids Use a flutter valve at least twice a day, 10 breaths each time Take Mucinex long-acting 1200 mg twice a day Keep using your inhaled medicines as you are doing Do not try to suppress your cough for at least a week, you need to cough out the mucus  Call us if you are not getting better  Keep your previously scheduled appointment with Dr. [name]

## 2017-04-20 ENCOUNTER — Ambulatory Visit: Payer: Medicare Other | Admitting: Emergency Medicine

## 2017-04-20 ENCOUNTER — Ambulatory Visit (INDEPENDENT_AMBULATORY_CARE_PROVIDER_SITE_OTHER): Payer: Medicare Other | Admitting: Emergency Medicine

## 2017-04-20 ENCOUNTER — Encounter: Payer: Self-pay | Admitting: Emergency Medicine

## 2017-04-20 ENCOUNTER — Ambulatory Visit (INDEPENDENT_AMBULATORY_CARE_PROVIDER_SITE_OTHER)
Admission: RE | Admit: 2017-04-20 | Discharge: 2017-04-20 | Disposition: A | Discharge status: Home / Self Care | Payer: Medicare Other | Source: Ambulatory Visit | Attending: Emergency Medicine | Admitting: Emergency Medicine

## 2017-04-20 DIAGNOSIS — I2510 Atherosclerotic heart disease of native coronary artery without angina pectoris: Secondary | ICD-10-CM

## 2017-04-20 DIAGNOSIS — M899 Disorder of bone, unspecified: Secondary | ICD-10-CM | POA: Insufficient documentation

## 2017-04-20 DIAGNOSIS — J479 Bronchiectasis, uncomplicated: Secondary | ICD-10-CM | POA: Diagnosis not present

## 2017-04-20 MED ORDER — PREDNISONE 10 MG PO TABS: ORAL_TABLET | ORAL | 0 refills | Status: DC

## 2017-04-20 NOTE — Patient Instructions (Addendum)
We will perform dedicated rib films to better assess your right fifth rib, rule out any worrisome lesion Please continue your Augmentin and Mucinex as directed by Dr. Lake Bells earlier this week.  You can stop the Mucinex after your upper respiratory infection resolves. Please take prednisone as directed until completely gone. Continue Claritin and Flonase as you take them. Keep albuterol available to use 2 puffs if needed for shortness of breath, wheezing, chest tightness. Start using Tylenol Cold and flu as directed for symptomatic relief, including your ear fullness and dizziness Call our office if you are not improving. We will discuss and decide at a future visit whether we need to repeat your CT scan of the chest, the timing of any repeat scans Follow with Dr [name] in 2 months or sooner if you have any problems.

## 2017-04-20 NOTE — Assessment & Plan Note (Signed)
She is just started treatment for a bronchiectasis flare, 2 days of antibiotics.  Not yet improved.  She continues to have wheeze.  She had wanted to avoid prednisone but now agrees that she is willing to take it.  She also wants symptomatic relief for upper respiratory infection, dizziness, ear fullness.

## 2017-04-20 NOTE — Progress Notes (Signed)
Subjective:    Patient ID: [name], female    DOB: Feb 08, 1935, 83 y.o.   MRN: [id number]  HPI 83 yo woman, never smoker, hx sarcoidosis that was dx by Dr Lucia Gaskins in [place] after FOB (? Whether she had biopsies or not), PSVT, GERD. She also had a hx severe PNA with empyema and chest tube (90's). She has DOE and difficulty walking. The decrease in function has come on gradually. Her cardiac stress test was negative as below. PFT show obstruction with a DLCO defect.   PFT 12/06/13 >> severe AFL without BD response.          ROV 11/01/16 -- patient has a history of sarcoidosis, severe obstructive lung disease. She also has restrictive disease due to history of empyema. Allergic rhinitis, chronic cough. She was seen here in match w URI, AE-COPD. Had an allergic rxn to levaquin. Tells me that she has persistent nasal gtt, on loraradine. Uses flonase prn.  She has some daily cough, clear mucous. She is not on scheduled BD, tells me that her breathing is . She uses albuterol rarely. She tries to exercise several times a week.   ROV 04/20/17 --pleasant 83 year old never smoker who carries a diagnosis of sarcoidosis and has documented severe obstructive lung disease and some bronchiectasis by CT chest (last 11/08/16).  Also with a history of coronary disease, esophageal reflux, allergic rhinitis.  She had a URI exposure from son-in-law 2 weeks ago, she started w URI sx 2 weeks ago, assoc  w chest tightness. She saw urgent care 9 days ago > received depo-medrol and 4 days of prednisone.  X-ray showed no infiltrates but some question of a possible right fifth rib osteoblastic lesion.  Continued to have wheeze and cough so was seen for acute evaluation by Dr. Lake Bells 2 days ago. Having yellow mucous from chest and sinuses.  She was started on Augmentin, mucinex for an exacerbation of her bronchiectasis.  She was wheezing some but wanted to try to avoid more prednisone if possible. She is feeling dizzy, has ear  fullness.  Our chest x-ray here did not show any evidence of the right fifth rib lesion.    Review of Systems  Constitutional: Negative for fever and unexpected weight change.  HENT: Negative for congestion, dental problem, ear pain, nosebleeds, postnasal drip, rhinorrhea, sinus pressure, sneezing, sore throat and trouble swallowing.   Eyes: Negative for redness and itching.  Respiratory: Negative for cough, chest tightness, shortness of breath and wheezing.   Cardiovascular: Negative for palpitations and leg swelling.  Gastrointestinal: Negative for nausea and vomiting.  Genitourinary: Negative for dysuria.  Musculoskeletal: Negative for joint swelling.  Skin: Negative for rash.  Neurological: Negative for headaches.  Hematological: Does not bruise/bleed easily.  Psychiatric/Behavioral: Negative for dysphoric mood. The patient is not nervous/anxious.       Objective:   Physical Exam Vitals:   04/20/17 1146  BP: 130/64  Pulse: 71  SpO2: 95%  Weight: 131 lb 3.2 oz (59.5 kg)  Height: 5\' 3"  (1.6 m)   Gen: Pleasant, kyphotic elderly woman, in no distress,  normal affect  ENT: No lesions,  mouth clear,  oropharynx clear, no postnasal drip  Neck: No JVD, no TMG, no carotid bruits  Lungs: No use of accessory muscles, clear without rales or rhonchi  Cardiovascular: RRR, heart sounds normal, no murmur or gallops, no peripheral edema  Musculoskeletal: No deformities, no cyanosis or clubbing  Neuro: alert, non focal  Skin: Warm, no lesions  or rashes   03/23/16 --  COMPARISON:  12/02/2014 high-resolution chest CT.  FINDINGS: Cardiovascular: Normal heart size. No significant pericardial fluid/thickening. Left anterior descending, left circumflex and right coronary atherosclerosis. Atherosclerotic nonaneurysmal thoracic aorta. Normal caliber pulmonary arteries.  Mediastinum/Nodes: No discrete thyroid nodules. Unremarkable esophagus. No pathologically enlarged axillary,  mediastinal or gross hilar lymph nodes, noting limited sensitivity for the detection of hilar adenopathy on this noncontrast study. Stable coarsely calcified subcentimeter right supraclavicular, bilateral paratracheal, AP window and subcarinal nodes.  Lungs/Pleura: No pneumothorax. No pleural effusion. No acute consolidative airspace disease or lung masses. There is new mild patchy tree-in-bud opacity in the superior segment right lower lobe. There is stable moderate cystic bronchiectasis with associated volume loss and distortion in the medial segment right middle lobe. There is scattered mild cylindrical and cystic bronchiectasis throughout the remaining right lung, not appreciably changed, with associated scattered mucoid impaction. There is stable mild parenchymal bands with associated mild volume loss and distortion in the posterior right upper lobe, superior segment and basilar right lower lobe. No new significant regions of bronchiectasis. No frank honeycombing. No significant air trapping on the expiration sequence.  Upper abdomen: Simple 1.3 cm inferior left liver lobe cyst. Simple 0.8 cm posterior interpolar right renal cyst.  Musculoskeletal: No aggressive appearing focal osseous lesions. Marked thoracic spondylosis.  IMPRESSION: 1. New mild patchy tree-in-bud opacity in the superior segment right lower lobe, most consistent with a mild nonspecific infectious or inflammatory bronchiolitis. 2. No appreciable change in the previously described findings of moderate cystic bronchiectasis in the medial segment right middle lobe, mild scattered bronchiectasis throughout the remaining right lung, and mild scarring in the right upper and right lower lobes. No evidence of progressive interstitial lung disease. 3. Aortic atherosclerosis. three-vessel coronary atherosclerosis.      Assessment & Plan:  Bronchiectasis without complication ([place]) She is just started treatment  for a bronchiectasis flare, 2 days of antibiotics.  Not yet improved.  She continues to have wheeze.  She had wanted to avoid prednisone but now agrees that she is willing to take it.  She also wants symptomatic relief for upper respiratory infection, dizziness, ear fullness.   SARCOIDOSIS, PULMONARY Prednisone taper starting at 30 mg daily. Albuterol as needed We will determine timing of repeat CT scan as we go forward  Rib lesion Possible right fifth rib abnormality noted on a chest x-ray that was done at urgent care.  I do not see anything abnormal on her chest x-ray done here 2 days ago.  I believe she needs dedicated rib films in order to sort this out.  We will order today.  Baltazar Apo, MD, PhD 04/20/2017, 12:12 PM [facility] [phone] or if no answer [phone]

## 2017-04-20 NOTE — Assessment & Plan Note (Signed)
Prednisone taper starting at 30 mg daily. Albuterol as needed We will determine timing of repeat CT scan as we go forward

## 2017-04-20 NOTE — Assessment & Plan Note (Signed)
Possible right fifth rib abnormality noted on a chest x-ray that was done at urgent care.  I do not see anything abnormal on her chest x-ray done here 2 days ago.  I believe she needs dedicated rib films in order to sort this out.  We will order today.

## 2017-04-21 DIAGNOSIS — S42409A Unspecified fracture of lower end of unspecified humerus, initial encounter for closed fracture: Secondary | ICD-10-CM

## 2017-04-21 HISTORY — DX: Unspecified fracture of lower end of unspecified humerus, initial encounter for closed fracture: S42.409A

## 2017-04-27 ENCOUNTER — Telehealth: Payer: Self-pay | Admitting: Emergency Medicine

## 2017-04-27 MED ORDER — AZITHROMYCIN 500 MG PO TABS: 500.0000 mg | ORAL_TABLET | Freq: Every day | ORAL | 0 refills | Status: DC

## 2017-04-27 NOTE — Telephone Encounter (Signed)
Pt is aware of below message and voiced her understanding. Rx for azithromycin has been sent to preferred pharmacy. Nothing further is needed.

## 2017-04-27 NOTE — Telephone Encounter (Signed)
Please asked her to finish the Augmentin in the prednisone as planned. Have her try using over-the-counter decongestant such as Tylenol Cold and flu as directed on the box. Please ask her to take azithromycin 500 mg once a day for the next 5 days (in addition to her other antibiotic)

## 2017-04-27 NOTE — Telephone Encounter (Signed)
Called and spoke with patient she states that she is still not feeling any better. Patient is short of breath and still coughing up thick yellow mucus, also wheezing and weak. Denies fever, body aches or chest pain. She has been using her Flonase as well as her nebulizer. Patient takes her last Augmentin pill tonight, she has 4 of her prednisone left. RB please advise on this, thanks.

## 2017-04-29 ENCOUNTER — Encounter (HOSPITAL_COMMUNITY): Payer: Self-pay | Admitting: Emergency Medicine

## 2017-04-29 ENCOUNTER — Emergency Department (HOSPITAL_COMMUNITY): Payer: Medicare Other

## 2017-04-29 ENCOUNTER — Emergency Department (HOSPITAL_COMMUNITY)
Admission: EM | Admit: 2017-04-29 | Discharge: 2017-04-29 | Disposition: A | Discharge status: Home / Self Care | Payer: Medicare Other | Attending: Emergency Medicine | Admitting: Emergency Medicine

## 2017-04-29 DIAGNOSIS — W1830XA Fall on same level, unspecified, initial encounter: Secondary | ICD-10-CM | POA: Insufficient documentation

## 2017-04-29 DIAGNOSIS — R42 Dizziness and giddiness: Secondary | ICD-10-CM | POA: Diagnosis not present

## 2017-04-29 DIAGNOSIS — I10 Essential (primary) hypertension: Secondary | ICD-10-CM | POA: Diagnosis not present

## 2017-04-29 DIAGNOSIS — Z79899 Other long term (current) drug therapy: Secondary | ICD-10-CM | POA: Insufficient documentation

## 2017-04-29 DIAGNOSIS — Z96641 Presence of right artificial hip joint: Secondary | ICD-10-CM | POA: Insufficient documentation

## 2017-04-29 DIAGNOSIS — Y9301 Activity, walking, marching and hiking: Secondary | ICD-10-CM | POA: Insufficient documentation

## 2017-04-29 DIAGNOSIS — Y92511 Restaurant or cafe as the place of occurrence of the external cause: Secondary | ICD-10-CM | POA: Diagnosis not present

## 2017-04-29 DIAGNOSIS — Y999 Unspecified external cause status: Secondary | ICD-10-CM | POA: Insufficient documentation

## 2017-04-29 DIAGNOSIS — M25551 Pain in right hip: Secondary | ICD-10-CM | POA: Diagnosis not present

## 2017-04-29 DIAGNOSIS — Z7982 Long term (current) use of aspirin: Secondary | ICD-10-CM | POA: Diagnosis not present

## 2017-04-29 DIAGNOSIS — S52021A Displaced fracture of olecranon process without intraarticular extension of right ulna, initial encounter for closed fracture: Secondary | ICD-10-CM | POA: Insufficient documentation

## 2017-04-29 DIAGNOSIS — I2510 Atherosclerotic heart disease of native coronary artery without angina pectoris: Secondary | ICD-10-CM | POA: Insufficient documentation

## 2017-04-29 DIAGNOSIS — S59901A Unspecified injury of right elbow, initial encounter: Secondary | ICD-10-CM | POA: Diagnosis present

## 2017-04-29 LAB — BASIC METABOLIC PANEL
ANION GAP: 9 (ref 5–15)
BUN: 28 mg/dL — ABNORMAL HIGH (ref 6–20)
CHLORIDE: 104 mmol/L (ref 101–111)
CO2: 21 mmol/L — ABNORMAL LOW (ref 22–32)
Calcium: 8.5 mg/dL — ABNORMAL LOW (ref 8.9–10.3)
Creatinine, Ser: 1.15 mg/dL — ABNORMAL HIGH (ref 0.44–1.00)
GFR, EST AFRICAN AMERICAN: 50 mL/min — AB (ref >60)
GFR, EST NON AFRICAN AMERICAN: 43 mL/min — AB (ref >60)
Glucose, Bld: 116 mg/dL — ABNORMAL HIGH (ref 65–99)
POTASSIUM: 4.7 mmol/L (ref 3.5–5.1)
SODIUM: 134 mmol/L — AB (ref 135–145)

## 2017-04-29 LAB — URINALYSIS, ROUTINE W REFLEX MICROSCOPIC
Bilirubin Urine: NEGATIVE
Glucose, UA: NEGATIVE mg/dL
Hgb urine dipstick: NEGATIVE
Ketones, ur: NEGATIVE mg/dL
Leukocytes, UA: NEGATIVE
NITRITE: NEGATIVE
PROTEIN: NEGATIVE mg/dL
SPECIFIC GRAVITY, URINE: 1.024 (ref 1.005–1.030)
pH: 5 (ref 5.0–8.0)

## 2017-04-29 LAB — CBC WITH DIFFERENTIAL/PLATELET
BASOS PCT: 0 %
Basophils Absolute: 0 10*3/uL (ref 0.0–0.1)
EOS ABS: 0 10*3/uL (ref 0.0–0.7)
Eosinophils Relative: 0 %
HCT: 41.7 % (ref 36.0–46.0)
HEMOGLOBIN: 13.6 g/dL (ref 12.0–15.0)
LYMPHS ABS: 1.2 10*3/uL (ref 0.7–4.0)
Lymphocytes Relative: 9 %
MCH: 32.2 pg (ref 26.0–34.0)
MCHC: 32.6 g/dL (ref 30.0–36.0)
MCV: 98.8 fL (ref 78.0–100.0)
Monocytes Absolute: 0.8 10*3/uL (ref 0.1–1.0)
Monocytes Relative: 6 %
NEUTROS PCT: 85 %
Neutro Abs: 10.6 10*3/uL — ABNORMAL HIGH (ref 1.7–7.7)
Platelets: 219 10*3/uL (ref 150–400)
RBC: 4.22 MIL/uL (ref 3.87–5.11)
RDW: 14 % (ref 11.5–15.5)
WBC: 12.6 10*3/uL — ABNORMAL HIGH (ref 4.0–10.5)

## 2017-04-29 LAB — TROPONIN I

## 2017-04-29 MED ORDER — SODIUM CHLORIDE 0.9 % IV BOLUS (SEPSIS): 1000.0000 mL | Freq: Once | INTRAVENOUS | Status: DC

## 2017-04-29 NOTE — Discharge Instructions (Signed)
Please read attached information regarding your condition and splint care. Take your home tramadol as needed for severe pain.  You can otherwise take Tylenol for pain Follow-up with your orthopedist listed below for further evaluation. Return to ED for worsening pain, numbness in arms or legs, additional injuries, falls, vision changes.

## 2017-04-29 NOTE — ED Provider Notes (Signed)
Medical screening examination/treatment/procedure(s) were conducted as a shared visit with non-physician practitioner(s) and myself.  I personally evaluated the patient during the encounter. Briefly, the patient is a 83 y.o. female who has had bronchitis for several weeks and associated lightheadedness presents to the emergency department after fall in the setting of lightheadedness.  Patient denied any chest pain, palpitations, shortness of breath, head trauma related to her fall.  She endorsed hip and right elbow pain.  Plain film negative for any pelvic or hip fractures.  Did note an olecranon process fracture.  Neurovascular intact distally.  Patient was splinted and will need to follow-up with orthopedic surgery.  Screening labs obtained and revealed mild renal insufficiency likely in the setting of dehydration.  Orthostatics were reassuring.  Rest of the workup was reassuring.  With negative CT.  EKG without acute ischemic changes or dysrhythmias. The patient is safe for discharge with strict return precautions.    EKG Interpretation  Date/Time:  Saturday April 29 2017 20:48:29 EST Ventricular Rate:  68 PR Interval:    QRS Duration: 86 QT Interval:  381 QTC Calculation: 406 R Axis:   53 Text Interpretation:  Sinus rhythm Anteroseptal infarct, old No significant change since last tracing Confirmed by Addison Lank [phone]) on 04/29/2017 8:59:14 PM           Cardama, Grayce Sessions, MD 04/30/17 0022

## 2017-04-29 NOTE — ED Triage Notes (Signed)
Pt fell at [facility] after getting dizzy. Reports went a head and ate but had worse pain and swelling in right elbow.

## 2017-04-29 NOTE — ED Triage Notes (Signed)
Pt adds that her right hip hurts. Patient ambulates with steady gait

## 2017-04-29 NOTE — ED Provider Notes (Signed)
[facility] DEPT Provider Note   CSN: [id number] Arrival date & time: 04/29/17  1728     History   Chief Complaint Chief Complaint  Patient presents with  . Fall  . Elbow Pain  . Hip Pain    HPI [name] is a 83 y.o. female with a past medical history of osteoporosis, sarcoidosis, CAD, who presents to ED for evaluation of right elbow pain, right hip pain after fall that occurred prior to arrival.  She tells me that she was at [employer] for lunch when she "felt lightheaded" and ended up falling and landing on her right elbow and right hip.  Denies any head injury, loss of consciousness.  She was able to stand up with assistance after the accident and ate her entire meal bear.  He tells me that the swelling in her right elbow has increased since then.  She does have a history of right hip replacement several years ago and is concerned about her right hip.  She has been ambulatory with normal gait since.  She tells me she has been on 3 different antibiotics for her acute bronchitis over the past month which she believes is causing her to feel lightheaded.  Denies any vomiting, diarrhea, chest pain, shortness of breath, headache, vision changes, neck pain, lower back pain, changes in gait, prior fracture, dislocations or procedures in the right elbow.  HPI  Past Medical History:  Diagnosis Date  . Allergy    SEASONAL  . Arthritis   . Baker's cyst of knee, right   . CAD (coronary artery disease)    a. Myoview 10/15 - normal EF 70% // Myoview 11/16: EF 75%, normal perfusion, (there was no blood pressure drop at low level exercise with Lexiscan infusion), low risk study // c. LHC 3/17 - LAD irregs, oLCx 70 (neg FFR), mRCA 30, EF 55-65% >> med Rx  . Carotid stenosis    a. Carotid US 9/15 - Bilateral 1-39% ICA >> FU 2 years // b. Bilateral ICA 1-39 >> FU prn  . Esophageal reflux   . Hematochezia   . History of echocardiogram    a. Echo 10/15  - EF 60-65%, no RWMA  . HLD (hyperlipidemia)   . Hx of colonoscopy   . Osteoporosis   . Other chronic pulmonary heart diseases   . Paroxysmal supraventricular tachycardia ([place])   . Sarcoidosis     Patient Active Problem List   Diagnosis Date Noted  . Rib lesion 04/20/2017  . Bronchiectasis without complication ([place]) 40/98/1191  . Allergic rhinitis 09/15/2015  . CAD (coronary artery disease) 05/18/2015  . Essential hypertension 05/18/2015  . Carotid artery disease ([place]) 05/18/2015  . Cough 03/19/2015  . Left foot pain 09/02/2014  . DOE (dyspnea on exertion) 12/26/2013  . Abnormality of gait 11/06/2013  . Scoliosis (and kyphoscoliosis), idiopathic 07/30/2013  . 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 Constipation 09/05/2012  . History of sinus tachycardia 09/05/2012  . Closed left subtrochanteric femur fracture S/P Open/closed and reduction, internal medullary fixation  09/05/2012  . Acute posthemorrhagic anemia 08/22/2012  . Lumbar radiculopathy 11/23/2011  . Leg weakness 11/23/2011  . Mitral regurgitation 11/22/2011  . DIARRHEA-PRESUMED INFECTIOUS 08/18/2008  . RECTAL BLEEDING 08/18/2008  . PERSONAL HX COLONIC POLYPS 08/18/2008  . DEGENERATIVE JOINT DISEASE 08/14/2008  . SKIN CANCER, HX OF 08/14/2008  . CARPAL TUNNEL SYNDROME, HX OF 08/14/2008  . SARCOIDOSIS, PULMONARY 08/08/2008  . HLD (hyperlipidemia) 08/08/2008  .  Paroxysmal supraventricular tachycardia ([place]) 08/08/2008  . GERD 08/08/2008    Past Surgical History:  Procedure Laterality Date  . arm surgery Right   . BLADDER SURGERY    . CARDIAC CATHETERIZATION N/A 05/20/2015   Procedure: Left Heart Cath and Coronary Angiography;  Surgeon: Sherren Mocha, MD;  Location: [facility];  Service: Cardiovascular;  Laterality: N/A;  . CARPAL TUNNEL RELEASE Left   . COLONOSCOPY    . FOOT SURGERY Right   . HIP SURGERY Right 2011   full replacement  . LEG SURGERY Left May 2014   femur fracture s/p open  and closed reduction in [place], Dr. Jimmye Norman  . POLYPECTOMY    . SHOULDER ARTHROSCOPY W/ ROTATOR CUFF REPAIR Right   . TRAPEZIUM RESECTION Right     OB History    Gravida Para Term Preterm AB Living   1 1 1  0 0 1   SAB TAB Ectopic Multiple Live Births   0 0 0 0 1       Home Medications    Prior to Admission medications   Medication Sig Start Date End Date Taking? Authorizing Provider  albuterol (PROVENTIL HFA;VENTOLIN HFA) 108 (90 Base) MCG/ACT inhaler Inhale 2 puffs every 4 (four) hours as needed into the lungs for wheezing or shortness of breath. 01/03/17  Yes Collene Gobble, MD  aspirin EC 81 MG tablet Take 1 tablet (81 mg total) by mouth daily. 12/23/14  Yes Weaver, [name] T, PA-C  azithromycin (ZITHROMAX) 500 MG tablet Take 1 tablet (500 mg total) by mouth daily. 04/27/17  Yes Collene Gobble, MD  calcium carbonate (TUMS - DOSED IN MG ELEMENTAL CALCIUM) 500 MG chewable tablet Chew 1 tablet by mouth daily. Pt isn't sure of dose.   Yes [provider]  CARTIA XT 180 MG 24 hr capsule TAKE 1 CAPSULE(180 MG) BY MOUTH DAILY 08/02/16  Yes Weaver, [name] T, PA-C  denosumab (PROLIA) 60 MG/ML SOLN injection Inject 60 mg into the skin every 6 (six) months. Administer in upper arm, thigh, or abdomen   Yes [provider]  fluticasone (FLONASE) 50 MCG/ACT nasal spray Place into both nostrils as needed for allergies or rhinitis.   Yes [provider]  guaiFENesin (MUCINEX) 600 MG 12 hr tablet Take 1,200 mg by mouth 2 (two) times daily.   Yes [provider]  loratadine (CLARITIN) 10 MG tablet Take 1 tablet (10 mg total) by mouth daily. 09/15/15  Yes Collene Gobble, MD  MYRBETRIQ 50 MG TB24 tablet Take 50 mg by mouth daily.  12/09/14  Yes [provider]  omeprazole (PRILOSEC) 40 MG capsule Take 40 mg by mouth daily.  11/03/13  Yes [provider]  predniSONE (DELTASONE) 10 MG tablet Take 3 tablets for 3 days, 2 tablets for 3 days, 1 tablet for 3  days 04/20/17  Yes Byrum, Rose Fillers, MD  rosuvastatin (CRESTOR) 10 MG tablet Take 1 tablet by mouth two times per week only Patient taking differently: Take 10 mg by mouth. Take 1 tablet by mouth two times per week only 11/04/15  Yes Weaver, [name] T, PA-C  traMADol (ULTRAM) 50 MG tablet Take 50 mg by mouth every 6 (six) hours as needed for severe pain.  11/22/13  Yes [provider]  Vitamin D, Ergocalciferol, (DRISDOL) 50000 units CAPS capsule Take 50,000 Units by mouth every 7 (seven) days.   Yes [provider]  amoxicillin-clavulanate (AUGMENTIN) 875-125 MG tablet Take 1 tablet by mouth 2 (two) times  daily. Patient not taking: Reported on 04/29/2017 04/18/17   Juanito Doom, MD  nitroGLYCERIN (NITROSTAT) 0.4 MG SL tablet Place 1 tablet (0.4 mg total) under the tongue every 5 (five) minutes as needed for chest pain. 05/18/15   Liliane Shi, PA-C  Respiratory Therapy Supplies (FLUTTER) DEVI Use as directed 04/18/17   Juanito Doom, MD    Family History Family History  Problem Relation Age of Onset  . Colon cancer Mother 72  . Cancer Mother   . Lung cancer Father   . Heart disease Father   . Arrhythmia Father   . Cancer Father   . Cancer Sister   . Heart attack Brother   . Esophageal cancer Neg Hx   . Rectal cancer Neg Hx   . Stomach cancer Neg Hx   . Stroke Neg Hx     Social History Social History   Tobacco Use  . Smoking status: Never Smoker  . Smokeless tobacco: Never Used  Substance Use Topics  . Alcohol use: No  . Drug use: No     Allergies   Levaquin [levofloxacin]; Morphine; Oxycodone-acetaminophen; Percocet [oxycodone-acetaminophen]; and Sulfonamide derivatives   Review of Systems Review of Systems  Constitutional: Negative for appetite change, chills and fever.  HENT: Negative for ear pain, rhinorrhea, sneezing and sore throat.   Eyes: Negative for photophobia and visual disturbance.  Respiratory: Negative for cough, chest tightness,  shortness of breath and wheezing.   Cardiovascular: Negative for chest pain and palpitations.  Gastrointestinal: Negative for abdominal pain, blood in stool, constipation, diarrhea, nausea and vomiting.  Genitourinary: Negative for dysuria, hematuria and urgency.  Musculoskeletal: Positive for arthralgias. Negative for myalgias.  Skin: Negative for rash.  Neurological: Negative for dizziness, weakness and light-headedness.     Physical Exam Updated Vital Signs BP (!) 143/54   Pulse 77   Temp 97.9 F (36.6 C) (Oral)   Resp 18   LMP 02/22/1983 (Approximate)   SpO2 95%   Physical Exam  Constitutional: She is oriented to person, place, and time. She appears well-developed and well-nourished. No distress.  HENT:  Head: Normocephalic and atraumatic.  Nose: Nose normal.  Eyes: Conjunctivae and EOM are normal. Pupils are equal, round, and reactive to light. Right eye exhibits no discharge. Left eye exhibits no discharge. No scleral icterus.  Neck: Normal range of motion. Neck supple.  Cardiovascular: Normal rate, regular rhythm, normal heart sounds and intact distal pulses. Exam reveals no gallop and no friction rub.  No murmur heard. Pulmonary/Chest: Effort normal and breath sounds normal. No respiratory distress.  Abdominal: Soft. Bowel sounds are normal. She exhibits no distension. There is no tenderness. There is no guarding.  Musculoskeletal: Normal range of motion. She exhibits edema and tenderness.  Edema of the area of the right olecranon. Able to flex and extend at the right elbow without difficulty, but does report pain 2/2 raising R arm. 2+ radial pulse noted.  Sensation intact to light touch of bilateral lower extremities.  Full active and passive range of motion at wrist and digits. No midline spinal tenderness present in lumbar, thoracic or cervical spine. No step-off palpated. No visible bruising, edema or temperature change noted. No objective signs of numbness present. No  saddle anesthesia.  Neurological: She is alert and oriented to person, place, and time. No cranial nerve deficit or sensory deficit. She exhibits normal muscle tone. Coordination normal.  Skin: Skin is warm and dry. No rash noted.  Psychiatric: She has a normal mood  and affect.  Nursing note and vitals reviewed.    ED Treatments / Results  Labs (all labs ordered are listed, but only abnormal results are displayed) Labs Reviewed  CBC WITH DIFFERENTIAL/PLATELET - Abnormal; Notable for the following components:      Result Value   WBC 12.6 (*)    Neutro Abs 10.6 (*)    All other components within normal limits  BASIC METABOLIC PANEL - Abnormal; Notable for the following components:   Sodium 134 (*)    CO2 21 (*)    Glucose, Bld 116 (*)    BUN 28 (*)    Creatinine, Ser 1.15 (*)    Calcium 8.5 (*)    GFR calc non Af Amer 43 (*)    GFR calc Af Amer 50 (*)    All other components within normal limits  URINALYSIS, ROUTINE W REFLEX MICROSCOPIC  TROPONIN I    EKG  EKG Interpretation  Date/Time:  Saturday April 29 2017 20:48:29 EST Ventricular Rate:  68 PR Interval:    QRS Duration: 86 QT Interval:  381 QTC Calculation: 406 R Axis:   53 Text Interpretation:  Sinus rhythm Anteroseptal infarct, old No significant change since last tracing Confirmed by Addison Lank [phone]) on 04/29/2017 8:59:14 PM       Radiology Dg Elbow Complete Right  Result Date: 04/29/2017 CLINICAL DATA:  Pt states she had syncopal episode at [facility] today after eating. Now c/o pain to right posterior elbow as well as pain to right hip s/p fall. H/o right hip surgery in 2011. Denies any previous injury to right elbow. EXAM: RIGHT ELBOW - COMPLETE 3+ VIEW COMPARISON:  None. FINDINGS: There is a nondisplaced fracture across the mid olecranon, non comminuted and nonangulated. No other fractures.  The elbow joint is normally aligned. There is significant soft tissue swelling posterior to the fractured olecranon.  IMPRESSION: 1. Nondisplaced fracture of the olecranon. No dislocation. Significant posterior soft tissue swelling. Electronically Signed   By: Lajean Manes M.D.   On: 04/29/2017 18:09   Ct Head Wo Contrast  Result Date: 04/29/2017 CLINICAL DATA:  Fall EXAM: CT HEAD WITHOUT CONTRAST TECHNIQUE: Contiguous axial images were obtained from the base of the skull through the vertex without intravenous contrast. COMPARISON:  MRI brain 01/27/2016 FINDINGS: Brain: No acute territorial infarction, hemorrhage, or intracranial mass is visualized. Moderate atrophy. Moderate small vessel ischemic changes of the white matter. Stable ventricle size. Vascular: No hyperdense vessels. Vertebral artery and carotid vascular calcification. Skull: No fracture. Sinuses/Orbits: No acute finding. Other: None IMPRESSION: 1. No CT evidence for acute intracranial abnormality. 2. Atrophy and moderate small vessel ischemic changes of the white matter. Electronically Signed   By: Donavan Foil M.D.   On: 04/29/2017 20:17   Dg Hip Unilat  With Pelvis 2-3 Views Right  Result Date: 04/29/2017 CLINICAL DATA:  Pt states she had syncopal episode at [facility] today after eating. Now c/o pain to right posterior elbow as well as pain to right hip s/p fall. H/o right hip surgery in 2011. Denies any previous injury to right elbow. EXAM: DG HIP (WITH OR WITHOUT PELVIS) 2-3V RIGHT COMPARISON:  None. FINDINGS: No acute fracture.  No bone lesion. Right hip total arthroplasty is well-seated and well aligned. Previous left proximal femur fracture has been reduced with intramedullary rod and screws. The visualized orthopedic hardware on the left is also well-seated. SI joints and symphysis pubis are normally aligned. Bones are diffusely demineralized. Soft tissues are unremarkable. IMPRESSION: 1. No  fracture or acute finding. 2. No evidence of loosening of the orthopedic hardware. Electronically Signed   By: Lajean Manes M.D.   On: 04/29/2017 18:12     Procedures Procedures (including critical care time)  Medications Ordered in ED Medications  sodium chloride 0.9 % bolus 1,000 mL (1,000 mLs Intravenous Not Given 04/29/17 2056)     Initial Impression / Assessment and Plan / ED Course  I have reviewed the triage vital signs and the nursing notes.  Pertinent labs & imaging results that were available during my care of the patient were reviewed by me and considered in my medical decision making (see chart for details).     Patient presents to ED for evaluation of right elbow pain, right hip pain after fall that occurred prior to arrival.  She tells me that she felt "lightheaded", ended up falling and landing on her right elbow and right hip.  Denies any head injury or loss of consciousness.  She does have significant swelling and tenderness to palpation of the right elbow with no visible deformity.  Area is CMS intact.  She has no C, T or L-spine tenderness to palpation.  She has no deficits on her neurological examination.  X-ray of the right hip was negative.  X-ray of the right elbow showed nondisplaced fracture of the olecranon with no dislocation.  CT of the head was negative.  EKG with no changes since previous tracings.  Troponin negative, CBC, BMP, urinalysis unremarkable.  Sugar tong and posterior splint were applied to right arm successfully.  Patient tells me that she would prefer to only take tramadol as needed for pain and to otherwise take Tylenol as she sometimes feels "loopy" with stronger pain medications.  She tells me she has several tramadol tablets at home that she can use.  I also advised her to follow-up with her orthopedist for further evaluation.  Stable for discharge at this time. Strict return precautions given. Patient discussed with and seen by Dr. Leonette Monarch.  Portions of this note were generated with [employer]. Dictation errors may occur despite best attempts at proofreading.  Final Clinical  Impressions(s) / ED Diagnoses   Final diagnoses:  Closed fracture of right olecranon process, initial encounter    ED Discharge Orders    None       Delia Heady, PA-C 04/29/17 2224

## 2017-05-02 ENCOUNTER — Ambulatory Visit (HOSPITAL_COMMUNITY): Payer: Medicare Other

## 2017-05-02 DIAGNOSIS — M25521 Pain in right elbow: Secondary | ICD-10-CM | POA: Insufficient documentation

## 2017-05-03 ENCOUNTER — Ambulatory Visit: Payer: Self-pay | Admitting: Orthopedic Surgery

## 2017-05-03 ENCOUNTER — Other Ambulatory Visit: Payer: Self-pay | Admitting: Orthopedic Surgery

## 2017-05-03 ENCOUNTER — Telehealth: Payer: Self-pay | Admitting: Emergency Medicine

## 2017-05-03 ENCOUNTER — Telehealth: Payer: Self-pay

## 2017-05-03 NOTE — Telephone Encounter (Signed)
   [facility] Pre-operative Risk Assessment    Request for surgical clearance:  1. What type of surgery is being performed? Right: ORIF right olecranon fracture with repair/reconstruction, ulnar nerve transposition prn   2. When is this surgery scheduled? 05/09/17   3. What type of clearance is required (medical clearance vs. Pharmacy clearance to hold med vs. Both)? Both  4. Are there any medications that need to be held prior to surgery and how long? None listed   5. Practice name and name of physician performing surgery? [facility] - Grier Mitts, MD   6. What is your office phone and fax number? Phone: [phone] Fax: [phone], ATTN: Orson Slick   7. Anesthesia type (None, local, MAC, general) ? Choice   [name] 05/03/2017, 1:03 PM  _________________________________________________________________   (provider comments below)

## 2017-05-03 NOTE — Telephone Encounter (Signed)
Received forms and advised [name] Comment verbally. I placed form in RB sign folder. ---  Advised pt and she states she needs the form by this Friday or they will cancel her surgery. RB is at the hospital for the rest of the week. RB could you review and see if you can come by and sign this? Please advise.

## 2017-05-04 ENCOUNTER — Encounter: Payer: Self-pay | Admitting: *Deleted

## 2017-05-04 NOTE — Telephone Encounter (Signed)
Spoke with Dr. [name]. He is fine with clearing the pt for surgery based on her last OV. Form has been filled out and faxed along with a surgical clearance letter. Pt is aware. Nothing further was needed.

## 2017-05-04 NOTE — Telephone Encounter (Signed)
Called and spoke with patient, she is needing the form for surgery signed by Friday 3.15 or the surgery will be cancelled. RB can you please do this, thanks.

## 2017-05-04 NOTE — Telephone Encounter (Signed)
Pt is calling back [phone]

## 2017-05-04 NOTE — Telephone Encounter (Signed)
   Primary Cardiologist: Dr. Burt Knack   Chart reviewed as part of pre-operative protocol coverage. Patient was contacted 05/04/2017 in reference to pre-operative risk assessment for pending surgery as outlined below.  [name] was last seen on 12/13/16 by [name], PA-C.  Since that day, [name] has done well w/o anginal symptoms.  Therefore, based on ACC/AHA guidelines, the patient would be at acceptable risk for the planned procedure without further cardiovascular testing. Continue baby ASA and statin during perioperative periord.   I will route this recommendation to the requesting party via Epic fax function and remove from pre-op pool.  Please call with questions.  Lyda Jester, PA-C 05/04/2017, 2:03 PM

## 2017-05-08 ENCOUNTER — Encounter (HOSPITAL_COMMUNITY): Payer: Self-pay | Admitting: *Deleted

## 2017-05-08 ENCOUNTER — Other Ambulatory Visit: Payer: Self-pay

## 2017-05-08 NOTE — Progress Notes (Signed)
Spoke with pt for pre-op call. Pt has hx of CAD and SVT and is on Cartia XT. States the medication has kept the SVT under control. Pt denies any other heart issues. She has been instructed by cardiologist to continue Aspirin and Crestor during peri-op period. Cardiac and pulmonary clearance noted in  Epic on 05/04/17. Pt has hx of sarcoidosis. Spoke with Dr. Jillyn Hidden, Anesthesiologist before I called pt and he stated pt could eat a full breakfast by 8:30 AM and then clear liquids until 2:00 PM and then NPO. These instructions were given to pt.

## 2017-05-09 ENCOUNTER — Observation Stay (HOSPITAL_COMMUNITY)
Admission: RE | Admit: 2017-05-09 | Discharge: 2017-05-10 | Disposition: A | Discharge status: Home / Self Care | Payer: Medicare Other | Source: Ambulatory Visit | Attending: Orthopedic Surgery | Admitting: Orthopedic Surgery

## 2017-05-09 ENCOUNTER — Ambulatory Visit (HOSPITAL_COMMUNITY): Payer: Medicare Other | Admitting: Certified Registered"

## 2017-05-09 ENCOUNTER — Encounter (HOSPITAL_COMMUNITY)
Admission: RE | Disposition: A | Discharge status: Home / Self Care | Payer: Self-pay | Source: Ambulatory Visit | Attending: Orthopedic Surgery

## 2017-05-09 ENCOUNTER — Encounter (HOSPITAL_COMMUNITY): Payer: Self-pay

## 2017-05-09 DIAGNOSIS — Z79899 Other long term (current) drug therapy: Secondary | ICD-10-CM | POA: Insufficient documentation

## 2017-05-09 DIAGNOSIS — I2510 Atherosclerotic heart disease of native coronary artery without angina pectoris: Secondary | ICD-10-CM | POA: Diagnosis not present

## 2017-05-09 DIAGNOSIS — Z7982 Long term (current) use of aspirin: Secondary | ICD-10-CM | POA: Diagnosis not present

## 2017-05-09 DIAGNOSIS — S52021A Displaced fracture of olecranon process without intraarticular extension of right ulna, initial encounter for closed fracture: Secondary | ICD-10-CM

## 2017-05-09 DIAGNOSIS — I10 Essential (primary) hypertension: Secondary | ICD-10-CM | POA: Diagnosis not present

## 2017-05-09 DIAGNOSIS — E785 Hyperlipidemia, unspecified: Secondary | ICD-10-CM | POA: Diagnosis not present

## 2017-05-09 DIAGNOSIS — X58XXXA Exposure to other specified factors, initial encounter: Secondary | ICD-10-CM | POA: Insufficient documentation

## 2017-05-09 DIAGNOSIS — J45909 Unspecified asthma, uncomplicated: Secondary | ICD-10-CM | POA: Insufficient documentation

## 2017-05-09 DIAGNOSIS — K219 Gastro-esophageal reflux disease without esophagitis: Secondary | ICD-10-CM | POA: Insufficient documentation

## 2017-05-09 HISTORY — DX: Dyspnea, unspecified: R06.00

## 2017-05-09 HISTORY — DX: Displaced fracture of olecranon process without intraarticular extension of right ulna, initial encounter for closed fracture: S52.021A

## 2017-05-09 HISTORY — DX: Pneumonia, unspecified organism: J18.9

## 2017-05-09 HISTORY — PX: ORIF ELBOW FRACTURE: SHX5031

## 2017-05-09 HISTORY — DX: Unspecified asthma, uncomplicated: J45.909

## 2017-05-09 LAB — BASIC METABOLIC PANEL
Anion gap: 9 (ref 5–15)
BUN: 17 mg/dL (ref 6–20)
CALCIUM: 8.8 mg/dL — AB (ref 8.9–10.3)
CO2: 24 mmol/L (ref 22–32)
Chloride: 105 mmol/L (ref 101–111)
Creatinine, Ser: 0.66 mg/dL (ref 0.44–1.00)
GFR calc Af Amer: >60 mL/min (ref >60)
GLUCOSE: 81 mg/dL (ref 65–99)
Potassium: 3.8 mmol/L (ref 3.5–5.1)
Sodium: 138 mmol/L (ref 135–145)

## 2017-05-09 SURGERY — OPEN REDUCTION INTERNAL FIXATION (ORIF) ELBOW/OLECRANON FRACTURE
Anesthesia: Monitor Anesthesia Care | Site: Arm Upper | Laterality: Right

## 2017-05-09 MED ORDER — FENTANYL CITRATE (PF) 100 MCG/2ML IJ SOLN: 25.0000 ug | INTRAMUSCULAR | Status: DC | PRN

## 2017-05-09 MED ORDER — PANTOPRAZOLE SODIUM 40 MG PO TBEC
40.0000 mg | DELAYED_RELEASE_TABLET | Freq: Every day | ORAL | Status: DC
  Administered 2017-05-10: 40 mg via ORAL
  Filled 2017-05-09: qty 1

## 2017-05-09 MED ORDER — FENTANYL CITRATE (PF) 250 MCG/5ML IJ SOLN
INTRAMUSCULAR | Status: AC
  Filled 2017-05-09: qty 5

## 2017-05-09 MED ORDER — PROPOFOL 10 MG/ML IV BOLUS
INTRAVENOUS | Status: DC | PRN
  Administered 2017-05-09: 20 mg via INTRAVENOUS

## 2017-05-09 MED ORDER — HYDROMORPHONE HCL 1 MG/ML IJ SOLN
0.5000 mg | INTRAMUSCULAR | Status: DC | PRN
  Administered 2017-05-09: 0.5 mg via INTRAVENOUS
  Filled 2017-05-09: qty 1

## 2017-05-09 MED ORDER — ONDANSETRON HCL 4 MG/2ML IJ SOLN
INTRAMUSCULAR | Status: DC | PRN
  Administered 2017-05-09: 4 mg via INTRAVENOUS

## 2017-05-09 MED ORDER — CEFAZOLIN SODIUM-DEXTROSE 1-4 GM/50ML-% IV SOLN
1.0000 g | Freq: Three times a day (TID) | INTRAVENOUS | Status: DC
  Administered 2017-05-10 (×2): 1 g via INTRAVENOUS
  Filled 2017-05-09 (×3): qty 50

## 2017-05-09 MED ORDER — CEFAZOLIN SODIUM-DEXTROSE 1-4 GM/50ML-% IV SOLN: 1.0000 g | INTRAVENOUS | Status: DC

## 2017-05-09 MED ORDER — LACTATED RINGERS IV SOLN: INTRAVENOUS | Status: DC

## 2017-05-09 MED ORDER — DEXAMETHASONE SODIUM PHOSPHATE 10 MG/ML IJ SOLN
INTRAMUSCULAR | Status: AC
  Filled 2017-05-09: qty 1

## 2017-05-09 MED ORDER — CEFAZOLIN SODIUM-DEXTROSE 2-4 GM/100ML-% IV SOLN
2.0000 g | INTRAVENOUS | Status: AC
  Administered 2017-05-09: 2 g via INTRAVENOUS
  Filled 2017-05-09: qty 100

## 2017-05-09 MED ORDER — NITROGLYCERIN 0.4 MG SL SUBL: 0.4000 mg | SUBLINGUAL_TABLET | SUBLINGUAL | Status: DC | PRN

## 2017-05-09 MED ORDER — DILTIAZEM HCL ER COATED BEADS 180 MG PO CP24: 180.0000 mg | ORAL_CAPSULE | Freq: Every day | ORAL | Status: DC

## 2017-05-09 MED ORDER — LACTATED RINGERS IV SOLN
INTRAVENOUS | Status: DC
  Administered 2017-05-09 (×2): via INTRAVENOUS

## 2017-05-09 MED ORDER — PROMETHAZINE HCL 12.5 MG RE SUPP
12.5000 mg | Freq: Four times a day (QID) | RECTAL | Status: DC | PRN
  Filled 2017-05-09: qty 1

## 2017-05-09 MED ORDER — TAPENTADOL HCL 50 MG PO TABS: 50.0000 mg | ORAL_TABLET | ORAL | Status: DC | PRN

## 2017-05-09 MED ORDER — METHOCARBAMOL 1000 MG/10ML IJ SOLN
500.0000 mg | Freq: Four times a day (QID) | INTRAMUSCULAR | Status: DC | PRN
  Filled 2017-05-09: qty 5

## 2017-05-09 MED ORDER — ALBUTEROL SULFATE (2.5 MG/3ML) 0.083% IN NEBU: 3.0000 mL | INHALATION_SOLUTION | RESPIRATORY_TRACT | Status: DC | PRN

## 2017-05-09 MED ORDER — 0.9 % SODIUM CHLORIDE (POUR BTL) OPTIME
TOPICAL | Status: DC | PRN
  Administered 2017-05-09: 1000 mL

## 2017-05-09 MED ORDER — PHENYLEPHRINE HCL 10 MG/ML IJ SOLN
INTRAVENOUS | Status: DC | PRN
  Administered 2017-05-09: 25 ug/min via INTRAVENOUS

## 2017-05-09 MED ORDER — FENTANYL CITRATE (PF) 100 MCG/2ML IJ SOLN
50.0000 ug | Freq: Once | INTRAMUSCULAR | Status: AC
  Administered 2017-05-09: 50 ug via INTRAVENOUS

## 2017-05-09 MED ORDER — MEPIVACAINE HCL 1.5 % IJ SOLN
INTRAMUSCULAR | Status: DC | PRN
  Administered 2017-05-09: 20 mg via PERINEURAL

## 2017-05-09 MED ORDER — METHOCARBAMOL 500 MG PO TABS
500.0000 mg | ORAL_TABLET | Freq: Four times a day (QID) | ORAL | Status: DC | PRN
  Administered 2017-05-10: 500 mg via ORAL
  Filled 2017-05-09 (×2): qty 1

## 2017-05-09 MED ORDER — MIRABEGRON ER 50 MG PO TB24
50.0000 mg | ORAL_TABLET | Freq: Every day | ORAL | Status: DC
  Filled 2017-05-09: qty 1

## 2017-05-09 MED ORDER — FLUTICASONE PROPIONATE 50 MCG/ACT NA SUSP
1.0000 | NASAL | Status: DC | PRN
  Filled 2017-05-09: qty 16

## 2017-05-09 MED ORDER — SENNA 8.6 MG PO TABS
1.0000 | ORAL_TABLET | Freq: Two times a day (BID) | ORAL | Status: DC
  Administered 2017-05-10: 8.6 mg via ORAL
  Filled 2017-05-09: qty 1

## 2017-05-09 MED ORDER — ONDANSETRON HCL 4 MG/2ML IJ SOLN: 4.0000 mg | Freq: Once | INTRAMUSCULAR | Status: DC | PRN

## 2017-05-09 MED ORDER — CHLORHEXIDINE GLUCONATE 4 % EX LIQD: 60.0000 mL | Freq: Once | CUTANEOUS | Status: DC

## 2017-05-09 MED ORDER — ONDANSETRON HCL 4 MG PO TABS
4.0000 mg | ORAL_TABLET | Freq: Four times a day (QID) | ORAL | Status: DC | PRN
Start: 2017-05-09 — End: 2017-05-10

## 2017-05-09 MED ORDER — ONDANSETRON HCL 4 MG/2ML IJ SOLN: 4.0000 mg | Freq: Four times a day (QID) | INTRAMUSCULAR | Status: DC | PRN

## 2017-05-09 MED ORDER — FENTANYL CITRATE (PF) 100 MCG/2ML IJ SOLN
INTRAMUSCULAR | Status: AC
  Administered 2017-05-09: 50 ug via INTRAVENOUS
  Filled 2017-05-09: qty 2

## 2017-05-09 MED ORDER — PROPOFOL 500 MG/50ML IV EMUL
INTRAVENOUS | Status: DC | PRN
  Administered 2017-05-09: 75 ug/kg/min via INTRAVENOUS

## 2017-05-09 MED ORDER — SUCCINYLCHOLINE CHLORIDE 200 MG/10ML IV SOSY
PREFILLED_SYRINGE | INTRAVENOUS | Status: AC
  Filled 2017-05-09: qty 10

## 2017-05-09 MED ORDER — ONDANSETRON HCL 4 MG/2ML IJ SOLN
INTRAMUSCULAR | Status: AC
  Filled 2017-05-09: qty 2

## 2017-05-09 MED ORDER — BUPIVACAINE HCL (PF) 0.25 % IJ SOLN
INTRAMUSCULAR | Status: AC
  Filled 2017-05-09: qty 30

## 2017-05-09 MED ORDER — DEXAMETHASONE SODIUM PHOSPHATE 4 MG/ML IJ SOLN
INTRAMUSCULAR | Status: DC | PRN
  Administered 2017-05-09: 10 mg via INTRAVENOUS

## 2017-05-09 MED ORDER — ACETAMINOPHEN 500 MG PO TABS
1000.0000 mg | ORAL_TABLET | Freq: Four times a day (QID) | ORAL | Status: DC | PRN
  Administered 2017-05-10 (×2): 1000 mg via ORAL
  Filled 2017-05-09 (×3): qty 2

## 2017-05-09 MED ORDER — AZITHROMYCIN 500 MG PO TABS: 500.0000 mg | ORAL_TABLET | Freq: Every day | ORAL | Status: DC

## 2017-05-09 SURGICAL SUPPLY — 51 items
BANDAGE ACE 3X5.8 VEL STRL LF (GAUZE/BANDAGES/DRESSINGS) ×2 IMPLANT
BANDAGE ACE 4X5 VEL STRL LF (GAUZE/BANDAGES/DRESSINGS) ×2 IMPLANT
BNDG CMPR 9X4 STRL LF SNTH (GAUZE/BANDAGES/DRESSINGS) ×1
BNDG COHESIVE 4X5 TAN STRL (GAUZE/BANDAGES/DRESSINGS) ×2 IMPLANT
BNDG ESMARK 4X9 LF (GAUZE/BANDAGES/DRESSINGS) ×2 IMPLANT
BNDG GAUZE ELAST 4 BULKY (GAUZE/BANDAGES/DRESSINGS) ×4 IMPLANT
CABLE CERCLAGE W/NDL GRIP (Cable) ×1 IMPLANT
CORDS BIPOLAR (ELECTRODE) ×2 IMPLANT
COVER MAYO STAND STRL (DRAPES) ×2 IMPLANT
COVER SURGICAL LIGHT HANDLE (MISCELLANEOUS) ×2 IMPLANT
CUFF TOURNIQUET SINGLE 18IN (TOURNIQUET CUFF) ×2 IMPLANT
CUFF TOURNIQUET SINGLE 24IN (TOURNIQUET CUFF) IMPLANT
DRAPE INCISE IOBAN 66X45 STRL (DRAPES) ×2 IMPLANT
DRAPE OEC MINIVIEW 54X84 (DRAPES) ×1 IMPLANT
DRSG ADAPTIC 3X8 NADH LF (GAUZE/BANDAGES/DRESSINGS) ×1 IMPLANT
DRSG MEPITEL 4X7.2 (GAUZE/BANDAGES/DRESSINGS) ×1 IMPLANT
GAUZE SPONGE 4X4 12PLY STRL (GAUZE/BANDAGES/DRESSINGS) ×2 IMPLANT
GAUZE XEROFORM 1X8 LF (GAUZE/BANDAGES/DRESSINGS) ×2 IMPLANT
GAUZE XEROFORM 5X9 LF (GAUZE/BANDAGES/DRESSINGS) ×1 IMPLANT
GLOVE BIOGEL M 8.0 STRL (GLOVE) ×2 IMPLANT
GLOVE SS BIOGEL STRL SZ 8 (GLOVE) ×1 IMPLANT
GLOVE SUPERSENSE BIOGEL SZ 8 (GLOVE) ×1
GOWN STRL REUS W/ TWL LRG LVL3 (GOWN DISPOSABLE) ×2 IMPLANT
GOWN STRL REUS W/ TWL XL LVL3 (GOWN DISPOSABLE) ×3 IMPLANT
GOWN STRL REUS W/TWL LRG LVL3 (GOWN DISPOSABLE) ×4
GOWN STRL REUS W/TWL XL LVL3 (GOWN DISPOSABLE) ×4
KIT BASIN OR (CUSTOM PROCEDURE TRAY) ×2 IMPLANT
KIT ROOM TURNOVER OR (KITS) ×2 IMPLANT
LAWSON 3821 0.062 K-WRIES ×3 IMPLANT
LOOP VESSEL MAXI BLUE (MISCELLANEOUS) ×1 IMPLANT
MANIFOLD NEPTUNE II (INSTRUMENTS) ×2 IMPLANT
NDL HYPO 25GX1X1/2 BEV (NEEDLE) IMPLANT
NEEDLE HYPO 25GX1X1/2 BEV (NEEDLE) ×2 IMPLANT
NS IRRIG 1000ML POUR BTL (IV SOLUTION) ×2 IMPLANT
PACK ORTHO EXTREMITY (CUSTOM PROCEDURE TRAY) ×2 IMPLANT
PAD ARMBOARD 7.5X6 YLW CONV (MISCELLANEOUS) ×4 IMPLANT
PAD CAST 3X4 CTTN HI CHSV (CAST SUPPLIES) IMPLANT
PAD CAST 4YDX4 CTTN HI CHSV (CAST SUPPLIES) ×2 IMPLANT
PADDING CAST COTTON 3X4 STRL (CAST SUPPLIES) ×2
PADDING CAST COTTON 4X4 STRL (CAST SUPPLIES) ×2
SCRUB BETADINE 4OZ XXX (MISCELLANEOUS) ×2 IMPLANT
SOL PREP POV-IOD 4OZ 10% (MISCELLANEOUS) ×4 IMPLANT
SPECIMEN JAR SMALL (MISCELLANEOUS) ×2 IMPLANT
SUT PROLENE 3 0 PS 2 (SUTURE) ×2 IMPLANT
SUT VIC AB 3-0 FS2 27 (SUTURE) ×2 IMPLANT
SYR CONTROL 10ML LL (SYRINGE) ×1 IMPLANT
TOWEL OR 17X24 6PK STRL BLUE (TOWEL DISPOSABLE) ×2 IMPLANT
TOWEL OR 17X26 10 PK STRL BLUE (TOWEL DISPOSABLE) ×4 IMPLANT
TUBE CONNECTING 12X1/4 (SUCTIONS) IMPLANT
UNDERPAD 30X30 (UNDERPADS AND DIAPERS) ×2 IMPLANT
WATER STERILE IRR 1000ML POUR (IV SOLUTION) ×2 IMPLANT

## 2017-05-09 NOTE — Anesthesia Preprocedure Evaluation (Addendum)
Anesthesia Evaluation  Patient identified by MRN, date of birth, ID band Patient awake    Reviewed: Allergy & Precautions, H&P , NPO status , Patient's Chart, lab work & pertinent test results  Airway Mallampati: II  TM Distance: >3 FB Neck ROM: Full    Dental  (+) Dental Advisory Given, Teeth Intact   Pulmonary asthma ,  Sarcoidosis   Pulmonary exam normal breath sounds clear to auscultation       Cardiovascular hypertension, Pt. on medications Normal cardiovascular exam+ dysrhythmias Supra Ventricular Tachycardia + Valvular Problems/Murmurs MR  Rhythm:Regular Rate:Normal  '17 Carotid US - b/l 1-39% ICAS  '17 Cath - The left ventricular systolic function is normal. Mid RCA lesion, 30% stenosed. Mid Cx lesion, 40% stenosed. Ost Cx lesion, 70% stenosed  '16 Stress Test - Nuclear stress EF: 75%. Normal LV function There was no ST segment deviation noted during stress. The study is normal. No evidence of ischemia. This is a low risk study.   Neuro/Psych negative neurological ROS  negative psych ROS   GI/Hepatic Neg liver ROS, GERD  Medicated,  Endo/Other  negative endocrine ROS  Renal/GU negative Renal ROS  negative genitourinary   Musculoskeletal  (+) Arthritis ,   Abdominal   Peds  Hematology negative hematology ROS (+)   Anesthesia Other Findings   Reproductive/Obstetrics                           Anesthesia Physical Anesthesia Plan  ASA: II  Anesthesia Plan: MAC and Regional   Post-op Pain Management:    Induction: Intravenous  PONV Risk Score and Plan: 3 and Treatment may vary due to age or medical condition, Ondansetron and Propofol infusion  Airway Management Planned: Simple Face Mask  Additional Equipment: None  Intra-op Plan:   Post-operative Plan:   Informed Consent: I have reviewed the patients History and Physical, chart, labs and discussed the  procedure including the risks, benefits and alternatives for the proposed anesthesia with the patient or authorized representative who has indicated his/her understanding and acceptance.   Dental advisory given  Plan Discussed with: CRNA, Anesthesiologist and Surgeon  Anesthesia Plan Comments:       Anesthesia Quick Evaluation

## 2017-05-09 NOTE — H&P (Signed)
[name] is an 83 y.o. female.   Chief Complaint: Right elbow fracture displaced HPI: Patient presents with a right elbow fracture displaced.  She has swelling disability and loss of motion.  She complains of acute pain since the injury  She notes no other issues at present time. She and I discussed all issues.  She is here with her family.  She denies neck back chest or abdominal pain at present time.  She is here today for definitive surgical care Past Medical History:  Diagnosis Date  . Allergy    SEASONAL  . Arthritis   . Asthma    "slight"   . Baker's cyst of knee, right   . CAD (coronary artery disease)    a. Myoview 10/15 - normal EF 70% // Myoview 11/16: EF 75%, normal perfusion, (there was no blood pressure drop at low level exercise with Lexiscan infusion), low risk study // c. LHC 3/17 - LAD irregs, oLCx 70 (neg FFR), mRCA 30, EF 55-65% >> med Rx  . Carotid stenosis    a. Carotid US 9/15 - Bilateral 1-39% ICA >> FU 2 years // b. Bilateral ICA 1-39 >> FU prn  . Dyspnea    due to Sarcoidosis  . Esophageal reflux   . Hematochezia   . History of echocardiogram    a. Echo 10/15 - EF 60-65%, no RWMA  . HLD (hyperlipidemia)   . Hx of colonoscopy   . Osteoporosis   . Other chronic pulmonary heart diseases   . Paroxysmal supraventricular tachycardia ([place])   . Pneumonia   . Sarcoidosis     Past Surgical History:  Procedure Laterality Date  . arm surgery Right   . BLADDER SURGERY    . CARDIAC CATHETERIZATION N/A 05/20/2015   Procedure: Left Heart Cath and Coronary Angiography;  Surgeon: Sherren Mocha, MD;  Location: [facility];  Service: Cardiovascular;  Laterality: N/A;  . CARPAL TUNNEL RELEASE Left   . COLONOSCOPY    . FOOT SURGERY Right   . HIP SURGERY Right 2011   full replacement  . LEG SURGERY Left May 2014   femur fracture s/p open and closed reduction in [place], Dr. Jimmye Norman  . POLYPECTOMY    . SHOULDER ARTHROSCOPY W/ ROTATOR CUFF REPAIR  Right   . TRAPEZIUM RESECTION Right     Family History  Problem Relation Age of Onset  . Colon cancer Mother 71  . Cancer Mother   . Lung cancer Father   . Heart disease Father   . Arrhythmia Father   . Cancer Father   . Cancer Sister   . Heart attack Brother   . Esophageal cancer Neg Hx   . Rectal cancer Neg Hx   . Stomach cancer Neg Hx   . Stroke Neg Hx    Social History:  reports that  has never smoked. she has never used smokeless tobacco. She reports that she does not drink alcohol or use drugs.  Allergies:  Allergies  Allergen Reactions  . Levaquin [Levofloxacin] Other (See Comments)    Leg pain .Marland Kitchen Per doctor not to take again  . Sulfonamide Derivatives     Pt unsure  . Morphine Nausea Only  . Oxycodone-Acetaminophen Other (See Comments)    "talking out of my head"  . Percocet [Oxycodone-Acetaminophen] Nausea And Vomiting    Medications Prior to Admission  Medication Sig Dispense Refill  . acetaminophen (TYLENOL) 500 MG tablet Take 1,000 mg by mouth every 6 (six) hours as  needed.    Marland Kitchen albuterol (PROVENTIL HFA;VENTOLIN HFA) 108 (90 Base) MCG/ACT inhaler Inhale 2 puffs every 4 (four) hours as needed into the lungs for wheezing or shortness of breath. 1 Inhaler 6  . aspirin EC 81 MG tablet Take 1 tablet (81 mg total) by mouth daily.    Marland Kitchen azithromycin (ZITHROMAX) 500 MG tablet Take 1 tablet (500 mg total) by mouth daily. 5 tablet 0  . calcium carbonate (TUMS - DOSED IN MG ELEMENTAL CALCIUM) 500 MG chewable tablet Chew 1 tablet by mouth daily. Pt isn't sure of dose.    Marland Kitchen CARTIA XT 180 MG 24 hr capsule TAKE 1 CAPSULE(180 MG) BY MOUTH DAILY 90 capsule 2  . fluticasone (FLONASE) 50 MCG/ACT nasal spray Place into both nostrils as needed for allergies or rhinitis.    Marland Kitchen guaiFENesin (MUCINEX) 600 MG 12 hr tablet Take 1,200 mg by mouth 2 (two) times daily.    Marland Kitchen loratadine (CLARITIN) 10 MG tablet Take 1 tablet (10 mg total) by mouth daily. 30 tablet 11  . MYRBETRIQ 50 MG TB24  tablet Take 50 mg by mouth daily.     Marland Kitchen omeprazole (PRILOSEC) 40 MG capsule Take 40 mg by mouth daily.     . predniSONE (DELTASONE) 10 MG tablet Take 3 tablets for 3 days, 2 tablets for 3 days, 1 tablet for 3 days 18 tablet 0  . Respiratory Therapy Supplies (FLUTTER) DEVI Use as directed 1 each 0  . rosuvastatin (CRESTOR) 10 MG tablet Take 1 tablet by mouth two times per week only (Patient taking differently: Take 10 mg by mouth. Take 1 tablet by mouth two times per week only) 24 tablet 3  . traMADol (ULTRAM) 50 MG tablet Take 50 mg by mouth every 6 (six) hours as needed for severe pain.     . Vitamin D, Ergocalciferol, (DRISDOL) 50000 units CAPS capsule Take 50,000 Units by mouth every 7 (seven) days.    Marland Kitchen amoxicillin-clavulanate (AUGMENTIN) 875-125 MG tablet Take 1 tablet by mouth 2 (two) times daily. (Patient not taking: Reported on 04/29/2017) 20 tablet 0  . denosumab (PROLIA) 60 MG/ML SOLN injection Inject 60 mg into the skin every 6 (six) months. Administer in upper arm, thigh, or abdomen    . nitroGLYCERIN (NITROSTAT) 0.4 MG SL tablet Place 1 tablet (0.4 mg total) under the tongue every 5 (five) minutes as needed for chest pain. 25 tablet 3    Results for orders placed or performed during the hospital encounter of 05/09/17 (from the past 48 hour(s))  Basic metabolic panel     Status: Abnormal   Collection Time: 05/09/17  2:57 PM  Result Value Ref Range   Sodium 138 135 - 145 mmol/L   Potassium 3.8 3.5 - 5.1 mmol/L   Chloride 105 101 - 111 mmol/L   CO2 24 22 - 32 mmol/L   Glucose, Bld 81 65 - 99 mg/dL   BUN 17 6 - 20 mg/dL   Creatinine, Ser 0.66 0.44 - 1.00 mg/dL   Calcium 8.8 (L) 8.9 - 10.3 mg/dL   GFR calc non Af Amer >60 >60 mL/min   GFR calc Af Amer >60 >60 mL/min    Comment: (NOTE) The eGFR has been calculated using the CKD EPI equation. This calculation has not been validated in all clinical situations. eGFR's persistently <60 mL/min signify possible Chronic Kidney Disease.     Anion gap 9 5 - 15    Comment: Performed at [facility]  [address], [place], [place] [address]   No results found.  Review of Systems  HENT: Negative.   Eyes: Negative.   Genitourinary: Negative.   Neurological: Negative.   Endo/Heme/Allergies: Negative.     Blood pressure (!) 142/59, pulse 84, temperature 97.9 F (36.6 C), temperature source Oral, resp. rate 19, height 5' 3"  (1.6 m), weight 59.5 kg (131 lb 2.8 oz), last menstrual period 02/22/1983, SpO2 100 %. Physical Exam  Patient and I discussed all issues.  X-rays showed acutely displaced elbow fracture no evidence of infection no evidence of dystrophy.  She is sensate but with swelling in the hand in general.   The patient is alert and oriented in no acute distress. The patient complains of pain in the affected upper extremity.  The patient is noted to have a normal HEENT exam. Lung fields show equal chest expansion and no shortness of breath. Abdomen exam is nontender without distention. Lower extremity examination does not show any fracture dislocation or blood clot symptoms. Pelvis is stable and the neck and back are stable and nontender. Assessment/Plan Right elbow fracture displaced.  We will plan for open reduction internal fixation.  I discussed her all issues risk and benefits.  Given her age and the need to have a functional triceps to elevate out of a chair is going to be imperative to get her to heal.  This will forge ahead with surgical intervention as outlined.  We are planning surgery for your upper extremity. The risk and benefits of surgery to include risk of bleeding, infection, anesthesia,  damage to normal structures and failure of the surgery to accomplish its intended goals of relieving symptoms and restoring function have been discussed in detail. With this in mind we plan to proceed. I have specifically discussed with the patient the pre-and postoperative regime and the dos and don'ts  and risk and benefits in great detail. Risk and benefits of surgery also include risk of dystrophy(CRPS), chronic nerve pain, failure of the healing process to go onto completion and other inherent risks of surgery The relavent the pathophysiology of the disease/injury process, as well as the alternatives for treatment and postoperative course of action has been discussed in great detail with the patient who desires to proceed.  We will do everything in our power to help you (the patient) restore function to the upper extremity. It is a pleasure to see this patient today.   Willa Frater III, MD 05/09/2017, 5:38 PM

## 2017-05-09 NOTE — Transfer of Care (Signed)
Immediate Anesthesia Transfer of Care Note  Patient: [name]  Procedure(s) Performed: ORIF right olecranon fracture with repair/reconstruction, ulnar nerve transposition as needed (Right Arm Upper)  Patient Location: PACU  Anesthesia Type:MAC combined with regional for post-op pain  Level of Consciousness: awake, alert , oriented and patient cooperative  Airway & Oxygen Therapy: Patient Spontanous Breathing  Post-op Assessment: Report given to RN and Post -op Vital signs reviewed and stable  Post vital signs: Reviewed and stable  Last Vitals:  Vitals:   05/09/17 1650 05/09/17 1655  BP: (!) 155/55 (!) 142/59  Pulse: 88 84  Resp: (!) 21 19  Temp:    SpO2: 100% 100%    Last Pain:  Vitals:   05/09/17 1509  TempSrc: Oral      Patients Stated Pain Goal: 0 (88/91/69 4503)  Complications: No apparent anesthesia complications

## 2017-05-09 NOTE — Op Note (Signed)
See dictation#[id number] SP ORIF Right Elbow Kaileia Flow MD

## 2017-05-09 NOTE — Anesthesia Procedure Notes (Signed)
Anesthesia Regional Block: Supraclavicular block   Pre-Anesthetic Checklist: ,, timeout performed, Correct Patient, Correct Site, Correct Laterality, Correct Procedure, Correct Position, site marked, Risks and benefits discussed, pre-op evaluation,  At surgeon's request and post-op pain management  Laterality: Right  Prep: Maximum Sterile Barrier Precautions used, chloraprep       Needles:  Injection technique: Single-shot  Needle Type: Echogenic Stimulator Needle     Needle Length: 5cm  Needle Gauge: 22     Additional Needles:   Narrative:  Start time: 05/09/2017 4:41 PM End time: 05/09/2017 4:51 PM Injection made incrementally with aspirations every 5 mL. Anesthesiologist: Roderic Palau, MD  Additional Notes: 2% Lidocaine skin wheel.

## 2017-05-09 NOTE — Anesthesia Postprocedure Evaluation (Signed)
Anesthesia Post Note  Patient: [name]  Procedure(s) Performed: ORIF right olecranon fracture with repair/reconstruction, ulnar nerve transposition as needed (Right Arm Upper)     Patient location during evaluation: PACU Anesthesia Type: Regional and MAC Level of consciousness: awake and alert Pain management: pain level controlled Vital Signs Assessment: post-procedure vital signs reviewed and stable Respiratory status: spontaneous breathing, nonlabored ventilation, respiratory function stable and patient connected to nasal cannula oxygen Cardiovascular status: stable and blood pressure returned to baseline Postop Assessment: no apparent nausea or vomiting Anesthetic complications: no    Last Vitals:  Vitals:   05/09/17 1915 05/09/17 1930  BP: 134/85   Pulse: 69 71  Resp: 14 14  Temp:  36.8 C  SpO2: 96% 95%    Last Pain:  Vitals:   05/09/17 1509  TempSrc: Oral                 Katreena Schupp

## 2017-05-10 ENCOUNTER — Encounter (HOSPITAL_COMMUNITY): Payer: Self-pay

## 2017-05-10 ENCOUNTER — Other Ambulatory Visit: Payer: Self-pay

## 2017-05-10 DIAGNOSIS — S52021A Displaced fracture of olecranon process without intraarticular extension of right ulna, initial encounter for closed fracture: Secondary | ICD-10-CM | POA: Diagnosis not present

## 2017-05-10 MED ORDER — TRAMADOL HCL 50 MG PO TABS: 50.0000 mg | ORAL_TABLET | Freq: Four times a day (QID) | ORAL | 0 refills | Status: AC

## 2017-05-10 NOTE — Evaluation (Signed)
Occupational Therapy Evaluation Patient Details Name: [name] MRN: [id number] DOB: 1934-05-25 Today's Date: 05/10/2017    History of Present Illness 83 yo female s/p ORIF R olecranon   Past Medical History:  Diagnosis Date  . Allergy    SEASONAL  . Arthritis   . Asthma    "slight"   . Baker's cyst of knee, right   . CAD (coronary artery disease)    a. Myoview 10/15 - normal EF 70% // Myoview 11/16: EF 75%, normal perfusion, (there was no blood pressure drop at low level exercise with Lexiscan infusion), low risk study // c. LHC 3/17 - LAD irregs, oLCx 70 (neg FFR), mRCA 30, EF 55-65% >> med Rx  . Carotid stenosis    a. Carotid US 9/15 - Bilateral 1-39% ICA >> FU 2 years // b. Bilateral ICA 1-39 >> FU prn  . Dyspnea    due to Sarcoidosis  . Esophageal reflux   . Hematochezia   . History of echocardiogram    a. Echo 10/15 - EF 60-65%, no RWMA  . HLD (hyperlipidemia)   . Hx of colonoscopy   . Osteoporosis   . Other chronic pulmonary heart diseases   . Paroxysmal supraventricular tachycardia ([place])   . Pneumonia   . Sarcoidosis       Clinical Impression   Patient evaluated by Occupational Therapy with no further acute OT needs identified. All education has been completed and the patient has no further questions. See below for any follow-up Occupational Therapy or equipment needs. OT to sign off. Thank you for referral.      Follow Up Recommendations  No OT follow up    Equipment Recommendations  None recommended by OT    Recommendations for Other Services       Precautions / Restrictions Precautions Precautions: Fall Restrictions Weight Bearing Restrictions: Yes RUE Weight Bearing: Non weight bearing      Mobility Bed Mobility Overal bed mobility: Needs Assistance Bed Mobility: Supine to Sit     Supine to sit: Min assist     General bed mobility comments: cues to avoid weight on R UE  Transfers Overall transfer level: Needs assistance    Transfers: Sit to/from Stand Sit to Stand: Min assist         General transfer comment: pt (A) with steady for power up into standing    Balance                                           ADL either performed or assessed with clinical judgement   ADL Overall ADL's : Needs assistance/impaired                                       General ADL Comments: Pt and daughter MOD I for all adls at this time. Pt and daughter educated on carter sling for elevation, positioning in recliner, wedge for the bed ( discussed wedge from [facility] prime purcahse with visual) and bathing to keep dressing dry     Vision Baseline Vision/History: Wears glasses Wears Glasses: Reading only       Perception     Praxis      Pertinent Vitals/Pain Pain Assessment: Faces Faces Pain Scale: Hurts a little bit Pain Location: Right arm Pain Descriptors /  Indicators: Operative site guarding Pain Intervention(s): Premedicated before session;Monitored during session;Repositioned;Ice applied     Hand Dominance Right   Extremity/Trunk Assessment Upper Extremity Assessment Upper Extremity Assessment: RUE deficits/detail RUE Deficits / Details: s/p surg with cast upper arm to MCP of hand   Lower Extremity Assessment Lower Extremity Assessment: Overall WFL for tasks assessed(note shoe lift to make bil LE even length)   Cervical / Trunk Assessment Cervical / Trunk Assessment: Kyphotic   Communication Communication Communication: No difficulties   Cognition Arousal/Alertness: Awake/alert Behavior During Therapy: WFL for tasks assessed/performed Overall Cognitive Status: Within Functional Limits for tasks assessed                                     General Comments  ace wrap dry and in place at [facility] stime. pt in carter sling for elevation    Exercises Exercises: Hand exercises Hand Exercises Digit Composite Flexion: AROM;Right;10 reps Composite  Extension: AROM;Right;10 reps   Shoulder Instructions      Home Living Family/patient expects to be discharged to:: Private residence Living Arrangements: Children Available Help at Discharge: Family;Available 24 hours/day Type of Home: House Home Access: Stairs to enter [employer] of Steps: 1 Entrance Stairs-Rails: (rail) Home Layout: Able to live on main level with bedroom/bathroom     Bathroom Shower/Tub: Occupational psychologist: Standard     Home Equipment: Shower seat - built in   Additional Comments: lives with daughter and son in [place]. Pt has a Restaurant manager, fast food bedroom on main level. pt has two grandson's that live in [place] and one great granddaughter 10 mos      Prior Functioning/Environment Level of Independence: Independent        Comments: drives and enjoys going to see her grandchildren by driving to them        OT Problem List:        OT Treatment/Interventions:      OT Goals(Current goals can be found in the care plan section) Acute Rehab OT Goals Patient Stated Goal: to go home today  OT Frequency:     Barriers to D/C:            Co-evaluation              AM-PAC PT "6 Clicks" Daily Activity     Outcome Measure Help from another person eating meals?: A Little Help from another person taking care of personal grooming?: A Little Help from another person toileting, which includes using toliet, bedpan, or urinal?: A Little Help from another person bathing (including washing, rinsing, drying)?: A Little Help from another person to put on and taking off regular upper body clothing?: A Little Help from another person to put on and taking off regular lower body clothing?: A Little 6 Click Score: 18   End of Session Nurse Communication: Mobility status;Precautions  Activity Tolerance: Patient tolerated treatment well Patient left: in chair;with call bell/phone within reach;with family/visitor present  OT Visit Diagnosis:  Unsteadiness on feet (R26.81)                Time: 1113-1150 OT Time Calculation (min): 37 min Charges:  OT General Charges $OT Visit: 1 Visit OT Evaluation $OT Eval Moderate Complexity: 1 Mod OT Treatments $Self Care/Home Management : 8-22 mins G-Codes:      Jeri Modena   OTR/L Pager: [phone] Office: [phone] .   Parke Poisson B 05/10/2017,  12:20 PM

## 2017-05-10 NOTE — Discharge Summary (Signed)
Physician Discharge Summary  Patient ID: [name] MRN: [id number] DOB/AGE: 09-02-1934 83 y.o.  Admit date: 05/09/2017 Discharge date: 05/10/17  Admission Diagnoses: Displaced right olecranon fracture Past Medical History:  Diagnosis Date  . Allergy    SEASONAL  . Arthritis   . Asthma    "slight"   . Baker's cyst of knee, right   . CAD (coronary artery disease)    a. Myoview 10/15 - normal EF 70% // Myoview 11/16: EF 75%, normal perfusion, (there was no blood pressure drop at low level exercise with Lexiscan infusion), low risk study // c. LHC 3/17 - LAD irregs, oLCx 70 (neg FFR), mRCA 30, EF 55-65% >> med Rx  . Carotid stenosis    a. Carotid US 9/15 - Bilateral 1-39% ICA >> FU 2 years // b. Bilateral ICA 1-39 >> FU prn  . Dyspnea    due to Sarcoidosis  . Esophageal reflux   . Hematochezia   . History of echocardiogram    a. Echo 10/15 - EF 60-65%, no RWMA  . HLD (hyperlipidemia)   . Hx of colonoscopy   . Osteoporosis   . Other chronic pulmonary heart diseases   . Paroxysmal supraventricular tachycardia ([place])   . Pneumonia   . Sarcoidosis     Discharge Diagnoses:  Active Problems:   Closed fracture of right olecranon process   Surgeries: Procedure(s): ORIF right olecranon fracture with repair/reconstruction, ulnar nerve transposition as needed on 05/09/2017    Consultants: none  Discharged Condition: Improved  Hospital Course: [name] is an 83 y.o. female who was admitted 05/09/2017 with a chief complaint of No chief complaint on file. , and found to have a diagnosis of Displaced right olecranon fracture.  They were brought to the operating room on 05/09/2017 and underwent Procedure(s): ORIF right olecranon fracture with repair/reconstruction, ulnar nerve transposition as needed.  POD 1 patient was alert and oriented and doing very well. Pain was controlled, she denied nausea. Evaluation of the left upper extremity revealed moderate swelling of digits and  hand thus splint was rewrapped to diminish compression. Edema markedly improved. Sensation and refill are intact. Digital rom intact I discussed all discharge issues with the patient. Carter arm pillow was provided. We will see her in 2 weeks. They were given perioperative antibiotics:  Anti-infectives (From admission, onward)   Start     Dose/Rate Route Frequency Ordered Stop   05/10/17 0600  ceFAZolin (ANCEF) IVPB 2g/100 mL premix     2 g 200 mL/hr over 30 Minutes Intravenous On call to O.R. 05/09/17 1455 05/09/17 1754   05/10/17 0200  ceFAZolin (ANCEF) IVPB 1 g/50 mL premix     1 g 100 mL/hr over 30 Minutes Intravenous Every 8 hours 05/09/17 1950     05/09/17 2000  azithromycin (ZITHROMAX) tablet 500 mg  Status:  Discontinued     500 mg Oral Daily 05/09/17 1950 05/09/17 2003   05/09/17 2000  ceFAZolin (ANCEF) IVPB 1 g/50 mL premix  Status:  Discontinued     1 g 100 mL/hr over 30 Minutes Intravenous NOW 05/09/17 1950 05/09/17 1959    .  They were given sequential compression devices, early ambulation for DVT prophylaxis.  Recent vital signs:  Patient Vitals for the past 24 hrs:  BP Temp Temp src Pulse Resp SpO2 Height Weight  05/10/17 0618 129/71 97.8 F (36.6 C) Oral 92 18 96 % - -  05/09/17 2337 (!) 121/58 98.1 F (36.7 C) Oral 78 18 95 % - -  05/09/17 2002 (!) 148/69 97.8 F (36.6 C) Oral 79 - 98 % - -  05/09/17 1930 (!) 135/51 98.2 F (36.8 C) - 71 14 95 % - -  05/09/17 1915 134/85 - - 69 14 96 % - -  05/09/17 1900 114/80 (!) 97.3 F (36.3 C) - 72 16 99 % - -  05/09/17 1655 (!) 142/59 - - 84 19 100 % - -  05/09/17 1650 (!) 155/55 - - 88 (!) 21 100 % - -  05/09/17 1645 (!) 158/51 - - 84 19 100 % - -  05/09/17 1640 (!) 147/40 - - 83 (!) 21 99 % - -  05/09/17 1635 - - - 85 17 99 % - -  05/09/17 1630 - - - 80 19 98 % - -  05/09/17 1625 - - - 82 18 99 % - -  05/09/17 1620 - - - 81 17 99 % - -  05/09/17 1615 - - - - 20 - - -  05/09/17 1509 (!) 137/47 97.9 F (36.6 C) Oral  80 18 99 % 5\' 3"  (1.6 m) 59.5 kg (131 lb 2.8 oz)  .  Recent laboratory studies: No results found.  Discharge Medications:   Allergies as of 05/10/2017      Reactions   Levaquin [levofloxacin] Other (See Comments)   Leg pain .Marland Kitchen Per doctor not to take again   Sulfonamide Derivatives    Pt unsure   Morphine Nausea Only   Oxycodone-acetaminophen Other (See Comments)   "talking out of my head"   Percocet [oxycodone-acetaminophen] Nausea And Vomiting      Medication List    TAKE these medications   acetaminophen 500 MG tablet Commonly known as:  TYLENOL Take 1,000 mg by mouth every 6 (six) hours as needed.   albuterol 108 (90 Base) MCG/ACT inhaler Commonly known as:  PROVENTIL HFA;VENTOLIN HFA Inhale 2 puffs every 4 (four) hours as needed into the lungs for wheezing or shortness of breath.   aspirin EC 81 MG tablet Take 1 tablet (81 mg total) by mouth daily.   calcium carbonate 500 MG chewable tablet Commonly known as:  TUMS - dosed in mg elemental calcium Chew 1 tablet by mouth daily. Pt isn't sure of dose.   CARTIA XT 180 MG 24 hr capsule Generic drug:  diltiazem TAKE 1 CAPSULE(180 MG) BY MOUTH DAILY   denosumab 60 MG/ML Soln injection Commonly known as:  PROLIA Inject 60 mg into the skin every 6 (six) months. Administer in upper arm, thigh, or abdomen   fluticasone 50 MCG/ACT nasal spray Commonly known as:  FLONASE Place into both nostrils as needed for allergies or rhinitis.   FLUTTER Devi Use as directed   guaiFENesin 600 MG 12 hr tablet Commonly known as:  MUCINEX Take 1,200 mg by mouth 2 (two) times daily.   loratadine 10 MG tablet Commonly known as:  CLARITIN Take 1 tablet (10 mg total) by mouth daily.   MYRBETRIQ 50 MG Tb24 tablet Generic drug:  mirabegron ER Take 50 mg by mouth daily.   nitroGLYCERIN 0.4 MG SL tablet Commonly known as:  NITROSTAT Place 1 tablet (0.4 mg total) under the tongue every 5 (five) minutes as needed for chest pain.    omeprazole 40 MG capsule Commonly known as:  PRILOSEC Take 40 mg by mouth daily.   predniSONE 10 MG tablet Commonly known as:  DELTASONE Take 3 tablets for 3 days, 2 tablets for 3 days, 1 tablet for 3 days  rosuvastatin 10 MG tablet Commonly known as:  CRESTOR Take 1 tablet by mouth two times per week only What changed:    how much to take  how to take this  additional instructions   traMADol 50 MG tablet Commonly known as:  ULTRAM Take 50 mg by mouth every 6 (six) hours as needed for severe pain. What changed:  Another medication with the same name was added. Make sure you understand how and when to take each.   traMADol 50 MG tablet Commonly known as:  ULTRAM Take 1 tablet (50 mg total) by mouth 4 (four) times daily. What changed:  You were already taking a medication with the same name, and this prescription was added. Make sure you understand how and when to take each.   Vitamin D (Ergocalciferol) 50000 units Caps capsule Commonly known as:  DRISDOL Take 50,000 Units by mouth every 7 (seven) days.       Diagnostic Studies: Dg Chest 2 View  Result Date: 04/18/2017 CLINICAL DATA:  83 year old female with history of sarcoidosis. Cough for the past 5 days. EXAM: CHEST  2 VIEW COMPARISON:  Chest x-ray 01/25/2017. FINDINGS: No acute consolidative airspace disease. Chronic bronchial wall thickening, similar to prior examination. No pleural effusions. No suspicious appearing pulmonary nodules or masses. Heart size is normal. Upper mediastinal contours are within normal limits allowing for patient's rotation. Aortic atherosclerosis. IMPRESSION: 1. No radiographic evidence of acute cardiopulmonary disease. 2. Chronic bronchial wall thickening, similar to prior studies. Electronically Signed   By: Vinnie Langton M.D.   On: 04/18/2017 20:55   Dg Ribs Unilateral Right  Result Date: 04/20/2017 CLINICAL DATA:  Generalized right chest wall pain. Chest x-ray done at an urgent care  center suggested a fifth rib abnormality. EXAM: RIGHT RIBS - 2 VIEW COMPARISON:  PA and lateral chest x-ray of April 18, 2017 FINDINGS: The bones are subjectively osteopenic. There are deformities of the lateral aspects of the right fifth and sixth ribs which likely reflect old fractures. No lytic or blastic rib lesion is observed. No acute fracture is demonstrated. There is multilevel degenerative disc disease of the thoracic spine. A IMPRESSION: Old deformities of the posterolateral aspect of the right fifth and sixth ribs consistent with old fractures that have healed with deformity. No definite acute abnormality elsewhere. If the patient's symptoms warrant further evaluation, chest CT scanning would be the most useful next imaging step. Electronically Signed   By: David  Martinique M.D.   On: 04/20/2017 13:54   Dg Elbow Complete Right  Result Date: 04/29/2017 CLINICAL DATA:  Pt states she had syncopal episode at [facility] today after eating. Now c/o pain to right posterior elbow as well as pain to right hip s/p fall. H/o right hip surgery in 2011. Denies any previous injury to right elbow. EXAM: RIGHT ELBOW - COMPLETE 3+ VIEW COMPARISON:  None. FINDINGS: There is a nondisplaced fracture across the mid olecranon, non comminuted and nonangulated. No other fractures.  The elbow joint is normally aligned. There is significant soft tissue swelling posterior to the fractured olecranon. IMPRESSION: 1. Nondisplaced fracture of the olecranon. No dislocation. Significant posterior soft tissue swelling. Electronically Signed   By: Lajean Manes M.D.   On: 04/29/2017 18:09   Ct Head Wo Contrast  Result Date: 04/29/2017 CLINICAL DATA:  Fall EXAM: CT HEAD WITHOUT CONTRAST TECHNIQUE: Contiguous axial images were obtained from the base of the skull through the vertex without intravenous contrast. COMPARISON:  MRI brain 01/27/2016 FINDINGS: Brain: No acute  territorial infarction, hemorrhage, or intracranial mass is  visualized. Moderate atrophy. Moderate small vessel ischemic changes of the white matter. Stable ventricle size. Vascular: No hyperdense vessels. Vertebral artery and carotid vascular calcification. Skull: No fracture. Sinuses/Orbits: No acute finding. Other: None IMPRESSION: 1. No CT evidence for acute intracranial abnormality. 2. Atrophy and moderate small vessel ischemic changes of the white matter. Electronically Signed   By: Donavan Foil M.D.   On: 04/29/2017 20:17   Dg Hip Unilat  With Pelvis 2-3 Views Right  Result Date: 04/29/2017 CLINICAL DATA:  Pt states she had syncopal episode at [facility] today after eating. Now c/o pain to right posterior elbow as well as pain to right hip s/p fall. H/o right hip surgery in 2011. Denies any previous injury to right elbow. EXAM: DG HIP (WITH OR WITHOUT PELVIS) 2-3V RIGHT COMPARISON:  None. FINDINGS: No acute fracture.  No bone lesion. Right hip total arthroplasty is well-seated and well aligned. Previous left proximal femur fracture has been reduced with intramedullary rod and screws. The visualized orthopedic hardware on the left is also well-seated. SI joints and symphysis pubis are normally aligned. Bones are diffusely demineralized. Soft tissues are unremarkable. IMPRESSION: 1. No fracture or acute finding. 2. No evidence of loosening of the orthopedic hardware. Electronically Signed   By: Lajean Manes M.D.   On: 04/29/2017 18:12    They benefited maximally from their hospital stay and there were no complications.     Disposition: Discharge disposition: 01-Home or Self Care      Discharge Instructions    Call MD / Call 911   Complete by:  As directed    If you experience chest pain or shortness of breath, CALL 911 and be transported to the hospital emergency room.  If you develope a fever above 101 F, pus (white drainage) or increased drainage or redness at the wound, or calf pain, call your surgeon's office.   Constipation Prevention   Complete  by:  As directed    Drink plenty of fluids.  Prune juice may be helpful.  You may use a stool softener, such as Colace (over the counter) 100 mg twice a day.  Use MiraLax (over the counter) for constipation as needed.   Diet - low sodium heart healthy   Complete by:  As directed    Discharge instructions   Complete by:  As directed    Keep bandage clean and dry.  Call for any problems.  No smoking.  Criteria for driving a car: you should be off your pain medicine for 7-8 hours, able to drive one handed(confident), thinking clearly and feeling able in your judgement to drive. Continue elevation as it will decrease swelling.  If instructed by MD move your fingers within the confines of the bandage/splint.  Use ice if instructed by your MD. Call immediately for any sudden loss of feeling in your hand/arm or change in functional abilities of the extremity. We recommend that you to take vitamin C 1000 mg a day to promote healing. We also recommend that if you require  pain medicine that you take a stool softener to prevent constipation as most pain medicines will have constipation side effects. We recommend either Peri-Colace or Senokot and recommend that you also consider adding MiraLAX as well to prevent the constipation affects from pain medicine if you are required to use them. These medicines are over the counter and may be purchased at a local pharmacy. A cup of yogurt and a probiotic  can also be helpful during the recovery process as the medicines can disrupt your intestinal environment.   Increase activity slowly as tolerated   Complete by:  As directed      Follow-up Information    Roseanne Kaufman, MD Follow up in 2 week(s).   Specialty:  Orthopedic Surgery Why:  call for questions or concerns our office will call you with follow up date and time Contact information: [address] [place] [address] [phone]            Signed: Ivan Croft 05/10/2017,  9:10 AM

## 2017-05-10 NOTE — Discharge Instructions (Signed)

## 2017-05-10 NOTE — Plan of Care (Signed)
  Progressing Education: Knowledge of General Education information will improve 05/10/2017 1025 - Progressing by Rance Muir, RN Health Behavior/Discharge Planning: Ability to manage health-related needs will improve 05/10/2017 1025 - Progressing by Rance Muir, RN Clinical Measurements: Ability to maintain clinical measurements within normal limits will improve 05/10/2017 1025 - Progressing by Rance Muir, RN Will remain free from infection 05/10/2017 1025 - Progressing by Rance Muir, RN Diagnostic test results will improve 05/10/2017 1025 - Progressing by Rance Muir, RN Respiratory complications will improve 05/10/2017 1025 - Progressing by Rance Muir, RN Cardiovascular complication will be avoided 05/10/2017 1025 - Progressing by Rance Muir, RN Activity: Risk for activity intolerance will decrease 05/10/2017 1025 - Progressing by Rance Muir, RN Nutrition: Adequate nutrition will be maintained 05/10/2017 1025 - Progressing by Rance Muir, RN Coping: Level of anxiety will decrease 05/10/2017 1025 - Progressing by Rance Muir, RN Elimination: Will not experience complications related to bowel motility 05/10/2017 1025 - Progressing by Rance Muir, RN Will not experience complications related to urinary retention 05/10/2017 1025 - Progressing by Rance Muir, RN Pain Managment: General experience of comfort will improve 05/10/2017 1025 - Progressing by Rance Muir, RN Safety: Ability to remain free from injury will improve 05/10/2017 1025 - Progressing by Rance Muir, RN Skin Integrity: Risk for impaired skin integrity will decrease 05/10/2017 1025 - Progressing by Rance Muir, RN

## 2017-05-10 NOTE — Op Note (Signed)
NAMEMESHAWN, OCONNOR NO.:  [id number]  MEDICAL RECORD NO.:  [id number]  LOCATION:                                 FACILITY:  PHYSICIAN:  Satira Anis. Dat Derksen, M.D.DATE OF BIRTH:  March 06, 1934  DATE OF PROCEDURE: DATE OF DISCHARGE:                              OPERATIVE REPORT   PREOPERATIVE DIAGNOSIS:  Right displaced olecranon fracture about the elbow.  POSTOPERATIVE DIAGNOSIS:  Right displaced olecranon fracture about the elbow.  PROCEDURE: 1. Open reduction and internal fixation, right elbow fracture     (olecranon fracture with cable band and K-wire, tension band     construct). 2. Stress radiography/AP, lateral and oblique x-rays performed,     examined, and interpreted by myself 4-view series right elbow.  ASSISTANT:  Avelina Laine, PA-C.  COMPLICATIONS:  None.  ANESTHESIA:  Block with IV sedation.  TOURNIQUET TIME:  Less than an hour.  INDICATIONS:  A pleasant female, who is 83 years of age.  She has displaced fracture.  Given the need to have a functioning triceps to get out of a chair and propeller cell forward, I would recommend surgical fixation to try to give her the best semblance of anatomy and function. She and her family desired to proceed.  She had been preoperatively cleared by Dr. Einar Gip and a host of medical personnel.  I have discussed the risks and benefits, and she desires to proceed.  OPERATIVE PROCEDURE:  The patient was seen by myself and Anesthesia, taken to the operative suite, underwent smooth induction of IV sedation, gently turned and underwent bean bag insufflation.  Body parts were well padded.  She was prepped with 2 separate Hibiclens scrubs, followed by Betadine scrub and paint.  Time-out was observed.  Incision was made anterior and 50 mmHg of sterile tourniquet control.  Dissection was carried down.  Following this, fracture was accessed.  I irrigated and debrided this.  Following this, we then checked the ulnar  nerve.  I performed a limited look at the nerve, but I did not have to perform a formal in situ release.  We then placed two 0.062 K-wires from the tip of the olecranon to engage the anterior cortex of the ulna.  These were then backed out a slight bit.  Following this, we then prebent these after a cable was tensioned against the pins.  The cable was placed greater than 2 diameters distal to the fracture site and following this was threaded in a figure-of-eight fashion around the pins and intentions after the pins were bent and preset.  The pins were tucked into the area and looked great.  The fractured end digitated well.  This was tension band wire construct with cable device from [employer].  This was tensioned according to the fracture tightness, which was reviewed digitally and there were no complicating features.  We irrigated copiously.  We closed the fascia over the hardware so that there be no prominence and then closed the wound with a Prolene of a 3-0 variety.  There were no complicating features.  No drain was needed.  Estimated blood loss is less than 50 mL.  She tolerated the procedure well.  This was an  ORIF olecranon fracture.  She will be admitted overnight for IV antibiotics and general postop observation.  We will see her in the morning and discharge her if things look appropriate.  It is pleasure to see her going forward.  We will have to see how she does in the office in 12 to 14 days should go down to therapy for a long-arm splint. We needed this to be very well-padded to make sure there is nothing on her incision that could cause wound breakdown.  I would even go so far as to make an anterior slab for her. We will go ahead and begin gentle interval motion from weeks 2 to 6.  At 6 weeks, we will get her up to at least 90 to 110 degrees and beyond 6 weeks to move her past this accordingly.  She has a really good construct and thus, I feel that we can move her quickly.   These notes were discussed and all questions have been encouraged and answered.  It is a pleasure to see her today and participate in her care.  Should any issues arise, she will notify us.     Satira Anis. Amedeo Plenty, M.D.   ______________________________ Satira Anis. Amedeo Plenty, M.D.    Sampson Si  D:  05/09/2017  T:  05/10/2017  Job:  [id number]  cc:   Laverda Page, MD

## 2017-05-10 NOTE — Plan of Care (Signed)
  Adequate for Discharge Education: Knowledge of General Education information will improve 05/10/2017 1256 - Adequate for Discharge by Rance Muir, RN 05/10/2017 1025 - Progressing by Rance Muir, RN Health Behavior/Discharge Planning: Ability to manage health-related needs will improve 05/10/2017 1256 - Adequate for Discharge by Rance Muir, RN 05/10/2017 1025 - Progressing by Rance Muir, RN Clinical Measurements: Ability to maintain clinical measurements within normal limits will improve 05/10/2017 1256 - Adequate for Discharge by Rance Muir, RN 05/10/2017 1025 - Progressing by Rance Muir, RN Will remain free from infection 05/10/2017 1256 - Adequate for Discharge by Rance Muir, RN 05/10/2017 1025 - Progressing by Rance Muir, RN Diagnostic test results will improve 05/10/2017 1256 - Adequate for Discharge by Rance Muir, RN 05/10/2017 1025 - Progressing by Rance Muir, RN Respiratory complications will improve 05/10/2017 1256 - Adequate for Discharge by Rance Muir, RN 05/10/2017 1025 - Progressing by Rance Muir, RN Cardiovascular complication will be avoided 05/10/2017 1256 - Adequate for Discharge by Rance Muir, RN 05/10/2017 1025 - Progressing by Rance Muir, RN Activity: Risk for activity intolerance will decrease 05/10/2017 1256 - Adequate for Discharge by Rance Muir, RN 05/10/2017 1025 - Progressing by Rance Muir, RN Nutrition: Adequate nutrition will be maintained 05/10/2017 1256 - Adequate for Discharge by Rance Muir, RN 05/10/2017 1025 - Progressing by Rance Muir, RN Coping: Level of anxiety will decrease 05/10/2017 1256 - Adequate for Discharge by Rance Muir, RN 05/10/2017 1025 - Progressing by Rance Muir, RN Elimination: Will not experience complications related to bowel motility 05/10/2017 1256 - Adequate for Discharge by Rance Muir, RN 05/10/2017 1025 - Progressing by Rance Muir, RN Will not experience complications related to urinary retention 05/10/2017 1256 - Adequate for Discharge by Rance Muir,  RN 05/10/2017 1025 - Progressing by Rance Muir, RN Pain Managment: General experience of comfort will improve 05/10/2017 1256 - Adequate for Discharge by Rance Muir, RN 05/10/2017 1025 - Progressing by Rance Muir, RN Safety: Ability to remain free from injury will improve 05/10/2017 1256 - Adequate for Discharge by Rance Muir, RN 05/10/2017 1025 - Progressing by Rance Muir, RN Skin Integrity: Risk for impaired skin integrity will decrease 05/10/2017 1256 - Adequate for Discharge by Rance Muir, RN 05/10/2017 1025 - Progressing by Rance Muir, RN

## 2017-05-10 NOTE — Op Note (Deleted)
  The note originally documented on this encounter has been moved the the encounter in which it belongs.  

## 2017-06-06 DIAGNOSIS — Z5189 Encounter for other specified aftercare: Secondary | ICD-10-CM | POA: Insufficient documentation

## 2017-06-21 ENCOUNTER — Ambulatory Visit: Payer: Medicare Other | Admitting: Emergency Medicine

## 2017-07-05 ENCOUNTER — Ambulatory Visit (INDEPENDENT_AMBULATORY_CARE_PROVIDER_SITE_OTHER): Payer: Medicare Other | Admitting: Emergency Medicine

## 2017-07-05 ENCOUNTER — Encounter: Payer: Self-pay | Admitting: Emergency Medicine

## 2017-07-05 VITALS — BP 120/62 | HR 73 | Ht 63.0 in | Wt 133.4 lb

## 2017-07-05 DIAGNOSIS — I2510 Atherosclerotic heart disease of native coronary artery without angina pectoris: Secondary | ICD-10-CM | POA: Diagnosis not present

## 2017-07-05 DIAGNOSIS — R0602 Shortness of breath: Secondary | ICD-10-CM

## 2017-07-05 DIAGNOSIS — J479 Bronchiectasis, uncomplicated: Secondary | ICD-10-CM

## 2017-07-05 DIAGNOSIS — D869 Sarcoidosis, unspecified: Secondary | ICD-10-CM

## 2017-07-05 NOTE — Patient Instructions (Signed)
We will repeat your high-resolution CT scan of the chest without contrast in September 2019 to follow your bronchiectasis and scarring Use your albuterol 2 puffs up to every 4 hours if needed for shortness of breath.  Also try using this about 10 to 15 minutes before exertion to see if it helps you when you are walking or exerting. Keep track of how often you use it so that we can decide whether you might benefit from an every day inhaler at some point in the future. Please continue loratadine 10 mg daily. Please use fluticasone nasal spray 2 sprays each nostril as needed for congestion Please continue omeprazole 40 mg daily. Follow with Dr [name] in September after your CT scan to review together.

## 2017-07-05 NOTE — Progress Notes (Signed)
Subjective:    Patient ID: [name], female    DOB: 1935/02/04, 83 y.o.   MRN: [id number]  HPI 83 yo woman, never smoker, hx sarcoidosis that was dx by Dr Lucia Gaskins in [place] after FOB (? Whether she had biopsies or not), PSVT, GERD. She also had a hx severe PNA with empyema and chest tube (90's). She has DOE and difficulty walking. The decrease in function has come on gradually. Her cardiac stress test was negative as below. PFT show obstruction with a DLCO defect.   PFT 12/06/13 >> severe AFL without BD response.          ROV 11/01/16 -- patient has a history of sarcoidosis, severe obstructive lung disease. She also has restrictive disease due to history of empyema. Allergic rhinitis, chronic cough. She was seen here in match w URI, AE-COPD. Had an allergic rxn to levaquin. Tells me that she has persistent nasal gtt, on loraradine. Uses flonase prn.  She has some daily cough, clear mucous. She is not on scheduled BD, tells me that her breathing is . She uses albuterol rarely. She tries to exercise several times a week.   ROV 04/20/17 --pleasant 83 year old never smoker who carries a diagnosis of sarcoidosis and has documented severe obstructive lung disease and some bronchiectasis by CT chest (last 11/08/16).  Also with a history of coronary disease, esophageal reflux, allergic rhinitis.  She had a URI exposure from son-in-law 2 weeks ago, she started w URI sx 2 weeks ago, assoc  w chest tightness. She saw urgent care 9 days ago > received depo-medrol and 4 days of prednisone.  X-ray showed no infiltrates but some question of a possible right fifth rib osteoblastic lesion.  Continued to have wheeze and cough so was seen for acute evaluation by Dr. Lake Bells 2 days ago. Having yellow mucous from chest and sinuses.  She was started on Augmentin, mucinex for an exacerbation of her bronchiectasis.  She was wheezing some but wanted to try to avoid more prednisone if possible. She is feeling dizzy, has ear  fullness.  Our chest x-ray here did not show any evidence of the right fifth rib lesion.  ROV 07/05/17 --this is a follow-up visit for patient with a history of sarcoidosis, associated obstructive lung disease and bronchiectasis.  She also has coronary disease, cough in the setting of esophageal reflux and allergic rhinitis.  At her last visit at the end of February I treated her with a prednisone taper.  She had just previously been treated for a flare of bronchiectasis with antibiotics.  She had dedicated rib films done on 04/20/2017 that showed old deformities on the right fifth and sixth ribs consistent with healed fractures. She reports that she improved after abx and pred last time. She is using albuterol rarely, although she notes that it helped her exert recently. Uses flonase prn, loratadine daily, omeprazole 40.     Review of Systems  Constitutional: Negative for fever and unexpected weight change.  HENT: Negative for congestion, dental problem, ear pain, nosebleeds, postnasal drip, rhinorrhea, sinus pressure, sneezing, sore throat and trouble swallowing.   Eyes: Negative for redness and itching.  Respiratory: Negative for cough, chest tightness, shortness of breath and wheezing.   Cardiovascular: Negative for palpitations and leg swelling.  Gastrointestinal: Negative for nausea and vomiting.  Genitourinary: Negative for dysuria.  Musculoskeletal: Negative for joint swelling.  Skin: Negative for rash.  Neurological: Negative for headaches.  Hematological: Does not bruise/bleed easily.  Psychiatric/Behavioral: Negative  for dysphoric mood. The patient is not nervous/anxious.       Objective:   Physical Exam Vitals:   07/05/17 1530  BP: 120/62  Pulse: 73  SpO2: 96%  Weight: 133 lb 6.4 oz (60.5 kg)  Height: 5\' 3"  (1.6 m)   Gen: Pleasant, kyphotic elderly woman, in no distress,  normal affect  ENT: No lesions,  mouth clear,  oropharynx clear, no postnasal drip  Neck: No JVD, no  stridor  Lungs: No use of accessory muscles, clear without rales or rhonchi  Cardiovascular: RRR, heart sounds normal, no murmur or gallops, no peripheral edema  Musculoskeletal: No deformities, no cyanosis or clubbing  Neuro: alert, non focal  Skin: Warm, no lesions or rashes       Assessment & Plan:  SARCOIDOSIS, PULMONARY Her CT scan is been overall stable since 2016.  She is been more symptomatic however.  I believe we should repeat in September 2020 which would be the one-year mark.  Bronchiectasis without complication ([place]) Repeat CT scan in September as above.  She notes additional dyspnea she does have obstructive lung disease by spirometry.  Of asked her to increase the use of her albuterol to see if she benefits.  She will keep track of her usage and depending on the frequency we may decide to start a schedule bronchodilator at some point in the future.  Baltazar Apo, MD, PhD 07/05/2017, 3:51 PM [facility] [phone] or if no answer [phone]

## 2017-07-05 NOTE — Assessment & Plan Note (Signed)
Repeat CT scan in September as above.  She notes additional dyspnea she does have obstructive lung disease by spirometry.  Of asked her to increase the use of her albuterol to see if she benefits.  She will keep track of her usage and depending on the frequency we may decide to start a schedule bronchodilator at some point in the future.

## 2017-07-05 NOTE — Assessment & Plan Note (Signed)
Her CT scan is been overall stable since 2016.  She is been more symptomatic however.  I believe we should repeat in September 2020 which would be the one-year mark.

## 2017-07-06 ENCOUNTER — Encounter (HOSPITAL_COMMUNITY): Payer: Self-pay

## 2017-07-06 ENCOUNTER — Ambulatory Visit (HOSPITAL_COMMUNITY)
Admission: RE | Admit: 2017-07-06 | Discharge: 2017-07-06 | Disposition: A | Discharge status: Home / Self Care | Payer: Medicare Other | Source: Ambulatory Visit | Attending: Internal Medicine | Admitting: Internal Medicine

## 2017-07-06 DIAGNOSIS — M810 Age-related osteoporosis without current pathological fracture: Secondary | ICD-10-CM | POA: Insufficient documentation

## 2017-07-06 MED ORDER — DENOSUMAB 60 MG/ML ~~LOC~~ SOSY
60.0000 mg | PREFILLED_SYRINGE | Freq: Once | SUBCUTANEOUS | Status: AC
  Administered 2017-07-06: 60 mg via SUBCUTANEOUS
  Filled 2017-07-06: qty 1

## 2017-07-06 NOTE — Discharge Instructions (Signed)
Denosumab injection °What is this medicine? °DENOSUMAB (den oh sue mab) slows bone breakdown. Prolia is used to treat osteoporosis in women after menopause and in men. Xgeva is used to treat a high calcium level due to cancer and to prevent bone fractures and other bone problems caused by multiple myeloma or cancer bone metastases. Xgeva is also used to treat giant cell tumor of the bone. °This medicine may be used for other purposes; ask your health care provider or pharmacist if you have questions. °COMMON BRAND NAME(S): Prolia, XGEVA °What should I tell my health care provider before I take this medicine? °They need to know if you have any of these conditions: °-dental disease °-having surgery or tooth extraction °-infection °-kidney disease °-low levels of calcium or Vitamin D in the blood °-malnutrition °-on hemodialysis °-skin conditions or sensitivity °-thyroid or parathyroid disease °-an unusual reaction to denosumab, other medicines, foods, dyes, or preservatives °-pregnant or trying to get pregnant °-breast-feeding °How should I use this medicine? °This medicine is for injection under the skin. It is given by a health care professional in a hospital or clinic setting. °If you are getting Prolia, a special MedGuide will be given to you by the pharmacist with each prescription and refill. Be sure to read this information carefully each time. °For Prolia, talk to your pediatrician regarding the use of this medicine in children. Special care may be needed. For Xgeva, talk to your pediatrician regarding the use of this medicine in children. While this drug may be prescribed for children as young as 13 years for selected conditions, precautions do apply. °Overdosage: If you think you have taken too much of this medicine contact a poison control center or emergency room at once. °NOTE: This medicine is only for you. Do not share this medicine with others. °What if I miss a dose? °It is important not to miss your  dose. Call your doctor or health care professional if you are unable to keep an appointment. °What may interact with this medicine? °Do not take this medicine with any of the following medications: °-other medicines containing denosumab °This medicine may also interact with the following medications: °-medicines that lower your chance of fighting infection °-steroid medicines like prednisone or cortisone °This list may not describe all possible interactions. Give your health care provider a list of all the medicines, herbs, non-prescription drugs, or dietary supplements you use. Also tell them if you smoke, drink alcohol, or use illegal drugs. Some items may interact with your medicine. °What should I watch for while using this medicine? °Visit your doctor or health care professional for regular checks on your progress. Your doctor or health care professional may order blood tests and other tests to see how you are doing. °Call your doctor or health care professional for advice if you get a fever, chills or sore throat, or other symptoms of a cold or flu. Do not treat yourself. This drug may decrease your body's ability to fight infection. Try to avoid being around people who are sick. °You should make sure you get enough calcium and vitamin D while you are taking this medicine, unless your doctor tells you not to. Discuss the foods you eat and the vitamins you take with your health care professional. °See your dentist regularly. Brush and floss your teeth as directed. Before you have any dental work done, tell your dentist you are receiving this medicine. °Do not become pregnant while taking this medicine or for 5 months after stopping   it. Talk with your doctor or health care professional about your birth control options while taking this medicine. Women should inform their doctor if they wish to become pregnant or think they might be pregnant. There is a potential for serious side effects to an unborn child. Talk  to your health care professional or pharmacist for more information. What side effects may I notice from receiving this medicine? Side effects that you should report to your doctor or health care professional as soon as possible: -allergic reactions like skin rash, itching or hives, swelling of the face, lips, or tongue -bone pain -breathing problems -dizziness -jaw pain, especially after dental work -redness, blistering, peeling of the skin -signs and symptoms of infection like fever or chills; cough; sore throat; pain or trouble passing urine -signs of low calcium like fast heartbeat, muscle cramps or muscle pain; pain, tingling, numbness in the hands or feet; seizures -unusual bleeding or bruising -unusually weak or tired Side effects that usually do not require medical attention (report to your doctor or health care professional if they continue or are bothersome): -constipation -diarrhea -headache -joint pain -loss of appetite -muscle pain -runny nose -tiredness -upset stomach This list may not describe all possible side effects. Call your doctor for medical advice about side effects. You may report side effects to FDA at 1-800-FDA-1088. Where should I keep my medicine? This medicine is only given in a clinic, doctor's office, or other health care setting and will not be stored at home. NOTE: This sheet is a summary. It may not cover all possible information. If you have questions about this medicine, talk to your doctor, pharmacist, or health care provider.  2018 Elsevier/Gold Standard (2016-03-01 19:17:21)

## 2017-07-09 ENCOUNTER — Other Ambulatory Visit: Payer: Self-pay | Admitting: Emergency Medicine

## 2017-07-11 ENCOUNTER — Ambulatory Visit (INDEPENDENT_AMBULATORY_CARE_PROVIDER_SITE_OTHER): Payer: Medicare Other | Admitting: Physician Assistant

## 2017-07-11 ENCOUNTER — Encounter: Payer: Self-pay | Admitting: Physician Assistant

## 2017-07-11 VITALS — BP 126/62 | HR 71 | Ht 63.0 in | Wt 134.5 lb

## 2017-07-11 DIAGNOSIS — I6523 Occlusion and stenosis of bilateral carotid arteries: Secondary | ICD-10-CM

## 2017-07-11 DIAGNOSIS — D869 Sarcoidosis, unspecified: Secondary | ICD-10-CM

## 2017-07-11 DIAGNOSIS — I2510 Atherosclerotic heart disease of native coronary artery without angina pectoris: Secondary | ICD-10-CM

## 2017-07-11 DIAGNOSIS — E785 Hyperlipidemia, unspecified: Secondary | ICD-10-CM | POA: Diagnosis not present

## 2017-07-11 MED ORDER — NITROGLYCERIN 0.4 MG SL SUBL: 0.4000 mg | SUBLINGUAL_TABLET | SUBLINGUAL | 11 refills | Status: DC | PRN

## 2017-07-11 NOTE — Progress Notes (Signed)
Cardiology Office Note:    Date:  07/11/2017   ID:  [name], DOB [birth year], MRN [id number]  PCP:  [name], MD  Cardiologist:  Sherren Mocha, MD  Pulmonologist:  Dr. [name]  Referring MD: [name], MD   Chief Complaint  Patient presents with  . Follow-up    CAD    History of Present Illness:    [name] is a 83 y.o. female with CAD, pulmonary sarcoidosis, HTN, HL, SVT. Echo in 10/15 demonstrated normal LVF. Myoview in 10/15 demonstrated no ischemia. She had a high res CT in 10/16 with coronary calcification. Myoview in 11/16 was neg for ischemia. She then had symptoms of bilateral arm pain concerning for angina and LHC in 3/17 demonstrated ostial LCx stenosis of 60-70% that was not hemodynamically significant by FFR.   She was last seen October 2018.  [name] returns for follow up.  She is here alone.  She broke her elbow in March 2019 after falling. She denies syncope or near syncope. Her elbow is feeling much better.  She denies chest pain, paroxysmal nocturnal dyspnea, significant leg swelling.  She has chronic dyspnea on exertion that is unchanged.    Prior CV studies:   The following studies were reviewed today:  Carotid US 10/17 bilat ICA 1-39 >> FU prn  LHC 05/20/15 LAD plaque - nonobstructive LCx ost 70% >> FFR 0.99 > 0.95 (not hemodynamically significant); mid 40% RCA mid 30% EF 55-65% 1. Moderate single-vessel CAD involving the ostium of the LCx. Interrogated with FFR which is negative. 2. Widely patent LAD and RCA. 3. Normal LV function. Recommendations: Medical therapy for nonobstructive CAD.  Myoview 11/16 EF 75%, no ischemia, low risk  High Resolution CT 12/02/14 IMPRESSION: 1. No findings to suggest interstitial lung disease. 2. The overall appearance the chest is very similar to the prior study 11/28/2012. Findings are nonspecific, but could be seen in the setting of reported sarcoidosis, as detailed above. 3.  Areas of bronchiectasis are noted in the right lung, most severe in the medial segment of the right middle lobe where there are areas of cystic bronchiectasis and architectural distortion. 4. Atherosclerosis, including left main and 3 vessel coronary artery disease. Assessment for potential risk factor modification, dietary therapy or pharmacologic therapy may be warranted, if clinically indicated. 5. Additional incidental findings, as above.  Myoview 10/15 Normal stress nuclear study. LV Ejection Fraction: 70%.  Echo 10/15 EF 60-65%, no RWMA  Carotid US 9/15 Bilateral 1-39% ICA >> FU 2 years  Past Medical History:  Diagnosis Date  . Allergy    SEASONAL  . Arthritis   . Asthma    "slight"   . Baker's cyst of knee, right   . CAD (coronary artery disease)    a. Myoview 10/15 - normal EF 70% // Myoview 11/16: EF 75%, normal perfusion, (there was no blood pressure drop at low level exercise with Lexiscan infusion), low risk study // c. LHC 3/17 - LAD irregs, oLCx 70 (neg FFR), mRCA 30, EF 55-65% >> med Rx  . Carotid stenosis    a. Carotid US 9/15 - Bilateral 1-39% ICA >> FU 2 years // b. Bilateral ICA 1-39 >> FU prn  . Dyspnea    due to Sarcoidosis  . Esophageal reflux   . Hematochezia   . History of echocardiogram    a. Echo 10/15 - EF 60-65%, no RWMA  . HLD (hyperlipidemia)   . Hx of colonoscopy   . Osteoporosis   .  Other chronic pulmonary heart diseases   . Paroxysmal supraventricular tachycardia ([place])   . Pneumonia   . Sarcoidosis    Surgical Hx: The patient  has a past surgical history that includes Trapezium resection (Right); Carpal tunnel release (Left); Shoulder arthroscopy w/ rotator cuff repair (Right); Foot surgery (Right); Bladder surgery; arm surgery (Right); Colonoscopy; Polypectomy; Hip surgery (Right, 2011); Leg Surgery (Left, May 2014); Cardiac catheterization (N/A, 05/20/2015); and ORIF elbow fracture (Right, 05/09/2017).   Current Medications: Current Meds   Medication Sig  . acetaminophen (TYLENOL) 500 MG tablet Take 1,000 mg by mouth every 6 (six) hours as needed.  Marland Kitchen albuterol (PROVENTIL HFA;VENTOLIN HFA) 108 (90 Base) MCG/ACT inhaler Inhale 2 puffs every 4 (four) hours as needed into the lungs for wheezing or shortness of breath.  Marland Kitchen albuterol (PROVENTIL HFA;VENTOLIN HFA) 108 (90 Base) MCG/ACT inhaler INHALE 2 PUFFS INTO THE LUNGS EVERY 4 HOURS AS NEEDED FOR WHEEZING OR SHORTNESS OF BREATH  . aspirin EC 81 MG tablet Take 1 tablet (81 mg total) by mouth daily.  . calcium carbonate (TUMS - DOSED IN MG ELEMENTAL CALCIUM) 500 MG chewable tablet Chew 1 tablet by mouth daily. Pt isn't sure of dose.  Marland Kitchen CARTIA XT 180 MG 24 hr capsule TAKE 1 CAPSULE(180 MG) BY MOUTH DAILY  . denosumab (PROLIA) 60 MG/ML SOLN injection Inject 60 mg into the skin every 6 (six) months. Administer in upper arm, thigh, or abdomen  . fluticasone (FLONASE) 50 MCG/ACT nasal spray Place into both nostrils as needed for allergies or rhinitis.  Marland Kitchen loratadine (CLARITIN) 10 MG tablet Take 1 tablet (10 mg total) by mouth daily.  Marland Kitchen MYRBETRIQ 50 MG TB24 tablet Take 50 mg by mouth daily.   . nitroGLYCERIN (NITROSTAT) 0.4 MG SL tablet Place 1 tablet (0.4 mg total) under the tongue every 5 (five) minutes as needed for chest pain.  Marland Kitchen omeprazole (PRILOSEC) 40 MG capsule Take 40 mg by mouth daily.   Marland Kitchen Respiratory Therapy Supplies (FLUTTER) DEVI Use as directed  . rosuvastatin (CRESTOR) 10 MG tablet Take 1 tablet by mouth two times per week only (Patient taking differently: Take 10 mg by mouth. Take 1 tablet by mouth two times per week only)  . traMADol (ULTRAM) 50 MG tablet Take 50 mg by mouth every 6 (six) hours as needed for severe pain.   . Vitamin D, Ergocalciferol, (DRISDOL) 50000 units CAPS capsule Take 50,000 Units by mouth every 7 (seven) days.  . [DISCONTINUED] nitroGLYCERIN (NITROSTAT) 0.4 MG SL tablet Place 1 tablet (0.4 mg total) under the tongue every 5 (five) minutes as needed for  chest pain.     Allergies:   Levaquin [levofloxacin]; Sulfonamide derivatives; Morphine; Oxycodone-acetaminophen; and Percocet [oxycodone-acetaminophen]   Social History   Tobacco Use  . Smoking status: Never Smoker  . Smokeless tobacco: Never Used  Substance Use Topics  . Alcohol use: No  . Drug use: No     Family Hx: The patient's family history includes Arrhythmia in her father; Cancer in her father, mother, and sister; Colon cancer (age of onset: 24) in her mother; Heart attack in her brother; Heart disease in her father; Lung cancer in her father. There is no history of Esophageal cancer, Rectal cancer, Stomach cancer, or Stroke.  ROS:   Please see the history of present illness.    Review of Systems  Respiratory: Positive for shortness of breath.    All other systems reviewed and are negative.   EKGs/Labs/Other Test Reviewed:    EKG:  EKG is  ordered today.  The ekg ordered today demonstrates normal sinus rhythm, heart rate 72, normal axis, septal Q waves, QTC 422, similar to prior tracing dated 12/13/2016  Recent Labs: 04/29/2017: Hemoglobin 13.6; Platelets 219 05/09/2017: BUN 17; Creatinine, Ser 0.66; Potassium 3.8; Sodium 138   From [facility] Cholesterol, total 166.000 m 11/29/2016 HDL 62 MG/DL 11/29/2016 LDL 89.000 mg 11/29/2016 Triglycerides 77.000 11/29/2016 A1C N/D Hemoglobin 13.600 04/29/2017 Creatinine, Serum 0.660 05/09/2017 Potassium 3.800 05/09/2017 Magnesium N/D ALT (SGPT) 9.000 unit 11/29/2016 TSH 1.080 06/07/2016 BNP N/D INR 0.990 05/18/2015 Platelets 219.000 04/29/2017  Recent Lipid Panel Lab Results  Component Value Date/Time   CHOL 156 11/03/2015 07:42 AM   TRIG 70 11/03/2015 07:42 AM   HDL 79 11/03/2015 07:42 AM   CHOLHDL 2.0 11/03/2015 07:42 AM   LDLCALC 63 11/03/2015 07:42 AM    Physical Exam:    VS:  BP 126/62   Pulse 71   Ht 5\' 3"  (1.6 m)   Wt 134 lb 8 oz (61 kg)   LMP 02/22/1983 (Approximate)   SpO2 97%   BMI 23.83 kg/m     Wt  Readings from Last 3 Encounters:  07/11/17 134 lb 8 oz (61 kg)  07/06/17 133 lb (60.3 kg)  07/05/17 133 lb 6.4 oz (60.5 kg)     Physical Exam  Constitutional: She is oriented to person, place, and time. She appears well-developed and well-nourished. No distress.  HENT:  Head: Normocephalic and atraumatic.  Neck: Neck supple. No JVD present.  Cardiovascular: Normal rate, regular rhythm, S1 normal, S2 normal and normal heart sounds.  No murmur heard. Pulmonary/Chest: Effort normal. She has no rales.  Abdominal: Soft. There is no hepatomegaly.  Musculoskeletal: Edema: trace bilat ankle edema.  Neurological: She is alert and oriented to person, place, and time.  Skin: Skin is warm and dry.    ASSESSMENT & PLAN:    Coronary artery disease involving native coronary artery of native heart without angina pectoris Moderate nonobstructive coronary disease by cardiac catheterization March 2017.  She denies symptoms of angina.  Continue medical therapy with aspirin, rosuvastatin.  Hyperlipidemia, unspecified hyperlipidemia type She has intolerance of statins.  She has been able to tolerate low-dose rosuvastatin.  Recent LDL 89.  She has follow-up lipids pending with her primary care physician next month.  If her LDL remains above 70, consider increasing rosuvastatin to 3 times a week.  Bilateral carotid artery stenosis Mild plaque by carotid US in 2017.  Plan repeat carotid US prior to next appointment in 1 year.  SARCOIDOSIS, PULMONARY Continue follow-up with pulmonology as planned.   Dispo:  Return in about 1 year (around 07/12/2018) for Routine Follow Up, w/ [name], PA-C.   Medication Adjustments/Labs and Tests Ordered: Current medicines are reviewed at length with the patient today.  Concerns regarding medicines are outlined above.  Tests Ordered: Orders Placed This Encounter  Procedures  . EKG 12-Lead   Medication Changes: Meds ordered this encounter  Medications  .  nitroGLYCERIN (NITROSTAT) 0.4 MG SL tablet    Sig: Place 1 tablet (0.4 mg total) under the tongue every 5 (five) minutes as needed for chest pain.    Dispense:  25 tablet    Refill:  [address] [name], [place]  07/11/2017 4:40 PM    [facility] [place], [place], [place]  [address] Phone: [phone]; Fax: [phone]

## 2017-07-11 NOTE — Patient Instructions (Signed)
Medication Instructions:  Your physician recommends that you continue on your current medications as directed. Please refer to the Current Medication list given to you today.   Labwork: None ordered today.  Testing/Procedures: Your physician has requested that you have a carotid duplex. This test is an ultrasound of the carotid arteries in your neck. It looks at blood flow through these arteries that supply the brain with blood. Allow one hour for this exam. There are no restrictions or special instructions. To be done 1 week before appointment with [name], PAC in 1 year.     Follow-Up: Your physician wants you to follow-up in: 1 year with [name], PAC. You will receive a reminder letter in the mail two months in advance. If you don't receive a letter, please call our office to schedule the follow-up appointment.   Any Other Special Instructions Will Be Listed Below (If Applicable).     If you need a refill on your cardiac medications before your next appointment, please call your pharmacy.

## 2017-08-11 ENCOUNTER — Telehealth: Payer: Self-pay | Admitting: *Deleted

## 2017-08-11 NOTE — Telephone Encounter (Signed)
Our office has received further lab results. I will have [name], PA review on Monday 08/14/17. Will have labs scanned into the chart as well.

## 2017-08-11 NOTE — Telephone Encounter (Signed)
I lmom with PCP office to please fax over most recent lipid panel.   NEED LIPID PANEL FAXED TO [facility], [place]

## 2017-08-11 NOTE — Telephone Encounter (Signed)
-----   Message from Liliane Shi, [place] sent at 08/10/2017 11:23 AM EDT ----- I had requested a copy of her Lipids from her PCP.  This is only a urine microalbumin. Can we request the Lipid results from her PCP? [name], PA-C    08/10/2017 11:22 AM

## 2017-08-22 ENCOUNTER — Telehealth: Payer: Self-pay | Admitting: Physician Assistant

## 2017-08-22 DIAGNOSIS — E785 Hyperlipidemia, unspecified: Secondary | ICD-10-CM

## 2017-08-22 DIAGNOSIS — I2510 Atherosclerotic heart disease of native coronary artery without angina pectoris: Secondary | ICD-10-CM

## 2017-08-22 MED ORDER — ROSUVASTATIN CALCIUM 10 MG PO TABS: 10.0000 mg | ORAL_TABLET | ORAL | 3 refills | Status: DC

## 2017-08-22 NOTE — Telephone Encounter (Signed)
I s/w both the pt and her daughter [name] [facility]) about lab results received from PCP. I went over recommendations per [name], PA to increase Crestor 10 mg from 2 x weekly to 3 x times weekly. FLP/LFT 11/27/17. Pt request will need refill for Crestor sent to [facility]. Pt and her daughter thanked me for the call today.

## 2017-08-22 NOTE — Telephone Encounter (Signed)
Reviewed labs from PCP: AST 18 ALT 14 Total cholesterol 159 Triglycerides 50 HDL 66 LDL 83 I would like to see LDL a little lower.  Let's see if she can tolerate taking Crestor 3 x a week.  PLAN:  1. Increase Crestor to 10 mg 3 days a week. 2. Repeat Lipids and LFTs in 3 months.  [name], PA-C    08/22/2017 9:58 AM

## 2017-10-17 DIAGNOSIS — J0100 Acute maxillary sinusitis, unspecified: Secondary | ICD-10-CM | POA: Insufficient documentation

## 2017-10-17 DIAGNOSIS — H9209 Otalgia, unspecified ear: Secondary | ICD-10-CM | POA: Insufficient documentation

## 2017-10-17 HISTORY — DX: Acute maxillary sinusitis, unspecified: J01.00

## 2017-11-01 ENCOUNTER — Ambulatory Visit (INDEPENDENT_AMBULATORY_CARE_PROVIDER_SITE_OTHER): Payer: Medicare Other | Admitting: Podiatry

## 2017-11-01 ENCOUNTER — Encounter: Payer: Self-pay | Admitting: Podiatry

## 2017-11-01 DIAGNOSIS — L84 Corns and callosities: Secondary | ICD-10-CM | POA: Diagnosis not present

## 2017-11-02 ENCOUNTER — Telehealth: Payer: Self-pay | Admitting: Neurology

## 2017-11-02 ENCOUNTER — Ambulatory Visit (INDEPENDENT_AMBULATORY_CARE_PROVIDER_SITE_OTHER): Payer: Medicare Other | Admitting: Neurology

## 2017-11-02 ENCOUNTER — Encounter

## 2017-11-02 ENCOUNTER — Encounter: Payer: Self-pay | Admitting: Neurology

## 2017-11-02 VITALS — BP 121/61 | HR 70 | Ht 63.0 in | Wt 132.0 lb

## 2017-11-02 DIAGNOSIS — I6523 Occlusion and stenosis of bilateral carotid arteries: Secondary | ICD-10-CM | POA: Diagnosis not present

## 2017-11-02 DIAGNOSIS — H8149 Vertigo of central origin, unspecified ear: Secondary | ICD-10-CM

## 2017-11-02 DIAGNOSIS — H9122 Sudden idiopathic hearing loss, left ear: Secondary | ICD-10-CM

## 2017-11-02 DIAGNOSIS — R413 Other amnesia: Secondary | ICD-10-CM

## 2017-11-02 DIAGNOSIS — R42 Dizziness and giddiness: Secondary | ICD-10-CM

## 2017-11-02 DIAGNOSIS — R2689 Other abnormalities of gait and mobility: Secondary | ICD-10-CM

## 2017-11-02 DIAGNOSIS — H814 Vertigo of central origin: Secondary | ICD-10-CM

## 2017-11-02 NOTE — Telephone Encounter (Signed)
Medicare/continental order sent to [facility]. No auth they will reach out to the pt to schedule.

## 2017-11-02 NOTE — Progress Notes (Signed)
[facility]    Provider:  Dr Jaynee Eagles Referring Provider: [name], MD Primary Care Physician:  [name], MD  CC:  Memory loss and fatigue  Interval history 11/02/2017: MMSE improved. Still c/o memory changes since 12/2015 however MMSE stable even improved. Sleep test was very mild for OSA and cpap was offered. MRI in 2017 did shpw mild diffuse and moderate perisylvian atrophy. Extensive testing in the past was normal (b12, mma, ace, sed, ana, sjogren,rf,heavy metals,IFE,b1,ck, AcRAb). She has a new issue, vertigo, left hearing loss, imbalance, worsening over last several months, no inciting events, no drainage or congestion. She is scheduled to see an ENT.  CT head 2019: reviewed images and agree with the following:  IMPRESSION: 1. No CT evidence for acute intracranial abnormality. 2. Atrophy and moderate small vessel ischemic changes of the white matter.  Interval history 01/02/2017: MMSE from 28/30 to 27/30 but improved to 29/30. Still c/o memory changes and extreme fatigue. Discussed sleep study for her fatigue and frequent awakenings and memory issues, need sleep eval for OSA or PLMS.  If this is negative/normal will send for formal neurocognitive testing.   Interval history 08/31/2016: This is an absolutely lovely 83 year old female here for follow-up. She is having short-term memory loss. A few hours or the next day she forgets conversations or things she's been told. They called and invited her to their sister's birthday party, she couldn't remember the restaurant name. Started a few years ago and is worsening. No Fhx of dementia. She lives with daughter and son in [place]. 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  Patient snores heavily, she wakes up frequently to urinate, falls asleep during the day, her son-in-law heard her snoring through her door. She also has extreme fatigue during the day. Recommended sleep  apnea evaluation.  HPI:  [name] is a 83 y.o. female for follow up offatigue and weakness and a new problem memory loss. Past medical history of hypertension, carotid bruit right, degenerative joint disease, fatigue, sarcoidosis, hyperlipidemia, left leg femur broken status-post rods, right hip replacement. She has significant fatigue. She complains of generalized weakness, fatigue, imbalance, sinking feelings like she is going to pass out, decreased energy, decreased smell and loss of taste, generally feeling bad. Extensive lab testing including CK, acetylcholine receptor antibodies, B1, multiple myeloma panel, heavy metals, rheumatoid factor, Sjogren's antibodies, ANA with reflex, sedimentation rate, angiotensin-converting enzyme, TSH, methylmalonic acid, B12 and folate all normal. MRI of the brain showed Mild diffuse and moderate perisylvian atrophy and mild small vessel disease but no etiology for her symptoms. EMG/NCS today to evaluate for other neuromuscular disorders such as myopathy/myositis. Reviewed all results with patient today.   Reviewed notes, labs and imaging from outside physicians, which showed:  B12 283. This was drawn 12/07/2015. CMP showed elevated MCV 104, BUN 24, creatinine 0.8 otherwise unremarkable drawn 11/30/2015.   Patient was seen a Barista. Complaining of strange sensations in her head, like she is thinking, kind of weak, could not describe it. Feels like she is having "sinking spells. Feels that balance, has also been seen by cardiology in October 2017. She was evaluated for nonobstructive one-vessel coronary artery disease, is continuing aspirin and statin PSVT without recurrence on calcium channel blocker, blood pressure control, history of hyperlipidemia intolerant of statins however is using Crestor twice a week, follow-up Dopplers and carotid artery disease were ordered, also discussed fatigue after eating, she follows with pulmonology  for history of sarcoidosis. She has arthritis      Social History   Socioeconomic History  . Marital status: Widowed    Spouse name: Not on file  . Number of children: 1  . Years of education: Not on file  . Highest education level: Some college, no degree  Occupational History  . Occupation: retired    Comment: still works as Scientist, 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  Social Needs  . Financial resource strain: Not on file  . Food insecurity:    Worry: Not on file    Inability: Not on file  . Transportation needs:    Medical: Not on file    Non-medical: Not on file  Tobacco Use  . Smoking status: Never Smoker  . Smokeless tobacco: Never Used  Substance and Sexual Activity  . Alcohol use: No  . Drug use: No  . Sexual activity: Never    Partners: Male    Birth control/protection: Post-menopausal  Lifestyle  . Physical activity:    Days per week: Not on file    Minutes per session: Not on file  . Stress: Not on file  Relationships  . Social connections:    Talks on phone: Not on file    Gets together: Not on file    Attends religious service: Not on file    Active member of club or organization: Not on file    Attends meetings of clubs or organizations: Not on file    Relationship status: Not on file  . Intimate partner violence:    Fear of current or ex partner: Not on file    Emotionally abused: Not on file    Physically abused: Not on file    Forced sexual activity: Not on file  Other Topics Concern  . Not on file  Social History Narrative   Lives with daughter and son in law   Caffeine use:  Coffee 1 per day   Right handed    Family History  Problem Relation Age of Onset  . Colon cancer Mother 90  . Cancer Mother   . Lung cancer Father   . Heart disease Father   . Arrhythmia Father   . Cancer Father   . Cancer Sister        ovarian  . Heart attack Brother   . Esophageal cancer Neg Hx   . Rectal cancer Neg Hx   . Stomach cancer Neg Hx   .  Stroke Neg Hx     Past Medical History:  Diagnosis Date  . Allergy    SEASONAL  . Arthritis   . Asthma    "slight"   . Baker's cyst of knee, right   . CAD (coronary artery disease)    a. Myoview 10/15 - normal EF 70% // Myoview 11/16: EF 75%, normal perfusion, (there was no blood pressure drop at low level exercise with Lexiscan infusion), low risk study // c. LHC 3/17 - LAD irregs, oLCx 70 (neg FFR), mRCA 30, EF 55-65% >> med Rx  . Carotid stenosis    a. Carotid US 9/15 - Bilateral 1-39% ICA >> FU 2 years // b. Bilateral ICA 1-39 >> FU prn  . Dyspnea    due to Sarcoidosis  . Elbow fracture 04/2017   Right, had surgery  . Esophageal reflux   . Hematochezia   . History of echocardiogram    a. Echo 10/15 - EF 60-65%, no RWMA  . HLD (hyperlipidemia)   .  Hx of colonoscopy   . Osteoporosis   . Other chronic pulmonary heart diseases   . Paroxysmal supraventricular tachycardia ([place])   . Pneumonia   . Sarcoidosis     Past Surgical History:  Procedure Laterality Date  . arm surgery Right   . BLADDER SURGERY    . CARDIAC CATHETERIZATION N/A 05/20/2015   Procedure: Left Heart Cath and Coronary Angiography;  Surgeon: Sherren Mocha, MD;  Location: [facility];  Service: Cardiovascular;  Laterality: N/A;  . CARPAL TUNNEL RELEASE Left   . COLONOSCOPY    . FOOT SURGERY Right   . HIP SURGERY Right 2011   full replacement  . LEG SURGERY Left May 2014   femur fracture s/p open and closed reduction in [place], Dr. Jimmye Norman  . ORIF ELBOW FRACTURE Right 05/09/2017   Procedure: ORIF right olecranon fracture with repair/reconstruction, ulnar nerve transposition as needed;  Surgeon: Roseanne Kaufman, MD;  Location: [facility];  Service: Orthopedics;  Laterality: Right;  Requests 90 mins  . POLYPECTOMY    . SHOULDER ARTHROSCOPY W/ ROTATOR CUFF REPAIR Right   . TRAPEZIUM RESECTION Right     Current Outpatient Medications  Medication Sig Dispense Refill  . acetaminophen (TYLENOL) 500 MG  tablet Take 1,000 mg by mouth every 6 (six) hours as needed.    Marland Kitchen aspirin EC 81 MG tablet Take 1 tablet (81 mg total) by mouth daily.    . calcium carbonate (TUMS - DOSED IN MG ELEMENTAL CALCIUM) 500 MG chewable tablet Chew 1 tablet by mouth daily. Pt isn't sure of dose.    Marland Kitchen CARTIA XT 180 MG 24 hr capsule TAKE 1 CAPSULE(180 MG) BY MOUTH DAILY 90 capsule 2  . denosumab (PROLIA) 60 MG/ML SOLN injection Inject 60 mg into the skin every 6 (six) months. Administer in upper arm, thigh, or abdomen    . fluticasone (FLONASE) 50 MCG/ACT nasal spray Place into both nostrils as needed for allergies or rhinitis.    Marland Kitchen loratadine (CLARITIN) 10 MG tablet Take 1 tablet (10 mg total) by mouth daily. 30 tablet 11  . MYRBETRIQ 50 MG TB24 tablet Take 50 mg by mouth daily.     Marland Kitchen omeprazole (PRILOSEC) 40 MG capsule Take 40 mg by mouth daily.     . rosuvastatin (CRESTOR) 10 MG tablet Take 1 tablet (10 mg total) by mouth as directed. Take 3 times a week 90 tablet 3  . traMADol (ULTRAM) 50 MG tablet Take 50 mg by mouth every 6 (six) hours as needed for severe pain.     . Vitamin D, Ergocalciferol, (DRISDOL) 50000 units CAPS capsule Take 50,000 Units by mouth every 7 (seven) days.    Marland Kitchen albuterol (PROVENTIL HFA;VENTOLIN HFA) 108 (90 Base) MCG/ACT inhaler Inhale 2 puffs every 4 (four) hours as needed into the lungs for wheezing or shortness of breath. 1 Inhaler 6  . albuterol (PROVENTIL HFA;VENTOLIN HFA) 108 (90 Base) MCG/ACT inhaler INHALE 2 PUFFS INTO THE LUNGS EVERY 4 HOURS AS NEEDED FOR WHEEZING OR SHORTNESS OF BREATH 1 Inhaler 5  . nitroGLYCERIN (NITROSTAT) 0.4 MG SL tablet Place 1 tablet (0.4 mg total) under the tongue every 5 (five) minutes as needed for chest pain. 25 tablet 11  . Respiratory Therapy Supplies (FLUTTER) DEVI Use as directed (Patient not taking: Reported on 11/02/2017) 1 each 0   No current facility-administered medications for this visit.     Allergies as of 11/02/2017 - Review Complete 11/02/2017    Allergen Reaction Noted  . Levaquin [  levofloxacin] Other (See Comments) 06/10/2016  . Sulfonamide derivatives  08/08/2008  . Morphine Nausea Only 08/08/2008  . Oxycodone-acetaminophen Other (See Comments) 08/08/2008  . Percocet [oxycodone-acetaminophen] Nausea And Vomiting 12/26/2013    Vitals: BP 121/61 (BP Location: Left Arm, Patient Position: Sitting)   Pulse 70   Ht 5' 3"  (1.6 m)   Wt 132 lb (59.9 kg)   LMP 02/22/1983 (Approximate)   BMI 23.38 kg/m  Last Weight:  Wt Readings from Last 1 Encounters:  11/02/17 132 lb (59.9 kg)   Last Height:   Ht Readings from Last 1 Encounters:  11/02/17 5' 3"  (1.6 m)    Physical exam: Exam: Gen: NAD, anxious                CV: RRR, no MRG. No Carotid Bruits. No peripheral edema, warm, nontender Eyes: Conjunctivae clear without exudates or hemorrhage  Neuro: Detailed Neurologic Exam  Speech:    Speech is normal; fluent and spontaneous with normal comprehension.  Cognition:    The patient is oriented to person, place, and time;     recent and remote memory intact;     language fluent;     normal attention, concentration,     fund of knowledge Cranial Nerves:    The pupils are equal, round, and reactive to light.Attempted fundoscopic exam but could not visualize due to small pupils. Visual fields are full to finger confrontation. Extraocular movements are intact. Trigeminal sensation is intact and the muscles of mastication are normal. The face is symmetric. The palate elevates in the midline. Hearing intact. Voice is normal. Shoulder shrug is normal. The tongue has normal motion without fasciculations.   Coordination:    No dysmetria  Gait:    antalgic and slow but not shuffling, good arm swing, good stride  Motor Observation:    No asymmetry, no atrophy, and no involuntary movements noted. Tone:    Normal muscle tone.    Posture:    Posture is slightly stooped    Strength:    Strength is symmetric and stable in the  upper and lower limbs.      Sensation: intact to LT     Reflex Exam:  DTR's:    Deep tendon reflexes in the upper and lower extremities are symmetrical bilaterally.   Toes:    The toes are equiv bilaterally (right toe surgery in past) Clonus:    Clonus is absent.     Assessment/Plan:Absolutely lovely 83 y.o. femalehere as a referral from Dr. Dagmar Hait initially forfor fatigue and weakness, memory loss since 2017. Extensive workup has been negative.  Today c/o memory changes progressive (since 2017). MMSE improved 29/30. Tried to reassure patient but feel formal testing at this point a good idea. Past medical history of hypertension, carotid bruit right, degenerative joint disease, fatigue, sarcoidosis, hyperlipidemia, left leg femur broken status post rods, right hip replacement. She has significant fatigue. She complains of chronic generalized weakness, fatigue,  generally feeling bad which is stable, continued memory changes.   She still c/o progressive memory loss, ongoing since 2017. Sleep test unremarkable. Workup unremarkable. Will send for formal memory testing.   Has new vertigo and left ear hearing problems, need to repeat MRI brain w/wo contrast with thin cuts through the IAC to eval for lesions such as schwannoma and also for continued memory loss for progression of perisylvian atrophy and small vessel disease .   MMSE from 28/30 to 27/30 but today 29/30. Still c/o progressive memory changes and extreme fatigue.  Sleep eval with mild sleep apnea, borderline for cpap. Nothing significant  MRI brain to evaluate for intracranial etiologies of her symptoms such as stroke or other lesions was unremarkable in 2017 will repeat for progression of perisylvian atrophy and white matter changes and to look for schwannoma or other lesions such as stroke due to vertigo and hearing loss acute on left  Emg/ncs was unremarkable  Extensive testing in the past was normal (b12, mma, ace, sed,  ana, sjogren,rf,heavy metals,IFE,b1,ck,  AcRAb)   Orders Placed This Encounter  Procedures  . MR BRAIN W WO CONTRAST  . Basic Metabolic Panel  . Ambulatory referral to Neuropsychology    [name], MD  [facility] [address] [place], [place] [address]  Phone [phone] Fax [phone]  A total of 40 minutes was spent face-to-face with this patient. Over half this time was spent on counseling patient on the excessive fatigue diagnosis and different diagnostic and therapeutic options available.

## 2017-11-02 NOTE — Progress Notes (Signed)
Subjective:   Patient ID: [name], female   DOB: 83 y.o.   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

## 2017-11-02 NOTE — Patient Instructions (Signed)
MRI brain Need a lab today Formal memory testing Follow up after if needed  Memory Compensation Strategies  1. Use "WARM" strategy.  W= write it down  A= associate it  R= repeat it  M= make a mental note  2.   You can keep a Social worker.  Use a 3-ring notebook with sections for the following: calendar, important names and phone numbers,  medications, doctors' names/phone numbers, lists/reminders, and a section to journal what you did  each day.   3.    Use a calendar to write appointments down.  4.    Write yourself a schedule for the day.  This can be placed on the calendar or in a separate section of the Memory Notebook.  Keeping a  regular schedule can help memory.  5.    Use medication organizer with sections for each day or morning/evening pills.  You may need help loading it  6.    Keep a basket, or pegboard by the door.  Place items that you need to take out with you in the basket or on the pegboard.  You may also want to  include a message board for reminders.  7.    Use sticky notes.  Place sticky notes with reminders in a place where the task is performed.  For example: " turn off the  stove" placed by the stove, "lock the door" placed on the door at eye level, " take your medications" on  the bathroom mirror or by the place where you normally take your medications.  8.    Use alarms/timers.  Use while cooking to remind yourself to check on food or as a reminder to take your medicine, or as a  reminder to make a call, or as a reminder to perform another task, etc.

## 2017-11-03 LAB — BASIC METABOLIC PANEL
BUN / CREAT RATIO: 32 — AB (ref 12–28)
BUN: 22 mg/dL (ref 8–27)
CO2: 21 mmol/L (ref 20–29)
CREATININE: 0.68 mg/dL (ref 0.57–1.00)
Calcium: 9.1 mg/dL (ref 8.7–10.3)
Chloride: 107 mmol/L — ABNORMAL HIGH (ref 96–106)
GFR calc Af Amer: 95 mL/min/{1.73_m2} (ref >59)
GFR calc non Af Amer: 82 mL/min/{1.73_m2} (ref >59)
GLUCOSE: 83 mg/dL (ref 65–99)
POTASSIUM: 4.3 mmol/L (ref 3.5–5.2)
SODIUM: 140 mmol/L (ref 134–144)

## 2017-11-07 ENCOUNTER — Ambulatory Visit: Payer: Medicare Other | Admitting: Emergency Medicine

## 2017-11-07 DIAGNOSIS — R51 Headache: Secondary | ICD-10-CM

## 2017-11-07 DIAGNOSIS — G8929 Other chronic pain: Secondary | ICD-10-CM | POA: Insufficient documentation

## 2017-11-07 DIAGNOSIS — H6983 Other specified disorders of Eustachian tube, bilateral: Secondary | ICD-10-CM | POA: Insufficient documentation

## 2017-11-09 ENCOUNTER — Ambulatory Visit (INDEPENDENT_AMBULATORY_CARE_PROVIDER_SITE_OTHER)
Admission: RE | Admit: 2017-11-09 | Discharge: 2017-11-09 | Disposition: A | Discharge status: Home / Self Care | Payer: Medicare Other | Source: Ambulatory Visit | Attending: Emergency Medicine | Admitting: Emergency Medicine

## 2017-11-09 DIAGNOSIS — R0602 Shortness of breath: Secondary | ICD-10-CM | POA: Diagnosis not present

## 2017-11-09 DIAGNOSIS — J479 Bronchiectasis, uncomplicated: Secondary | ICD-10-CM

## 2017-11-10 ENCOUNTER — Other Ambulatory Visit: Payer: Medicare Other

## 2017-11-13 ENCOUNTER — Encounter: Payer: Self-pay | Admitting: Emergency Medicine

## 2017-11-13 ENCOUNTER — Ambulatory Visit (INDEPENDENT_AMBULATORY_CARE_PROVIDER_SITE_OTHER): Payer: Medicare Other | Admitting: Emergency Medicine

## 2017-11-13 DIAGNOSIS — I6523 Occlusion and stenosis of bilateral carotid arteries: Secondary | ICD-10-CM

## 2017-11-13 DIAGNOSIS — J301 Allergic rhinitis due to pollen: Secondary | ICD-10-CM

## 2017-11-13 DIAGNOSIS — J479 Bronchiectasis, uncomplicated: Secondary | ICD-10-CM

## 2017-11-13 DIAGNOSIS — K219 Gastro-esophageal reflux disease without esophagitis: Secondary | ICD-10-CM | POA: Diagnosis not present

## 2017-11-13 NOTE — Assessment & Plan Note (Signed)
Continue current Flonase, loratadine

## 2017-11-13 NOTE — Progress Notes (Signed)
Subjective:    Patient ID: [name], female    DOB: [birth year], 83 y.o.   MRN: [id number]  HPI 83 yo woman, never smoker, hx sarcoidosis that was dx by Dr Lucia Gaskins in [place] after FOB (? Whether she had biopsies or not), PSVT, GERD. She also had a hx severe PNA with empyema and chest tube (90's). She has DOE and difficulty walking. The decrease in function has come on gradually. Her cardiac stress test was negative as below. PFT show obstruction with a DLCO defect.   PFT 12/06/13 >> severe AFL without BD response.          ROV 11/01/16 -- patient has a history of sarcoidosis, severe obstructive lung disease. She also has restrictive disease due to history of empyema. Allergic rhinitis, chronic cough. She was seen here in match w URI, AE-COPD. Had an allergic rxn to levaquin. Tells me that she has persistent nasal gtt, on loraradine. Uses flonase prn.  She has some daily cough, clear mucous. She is not on scheduled BD, tells me that her breathing is . She uses albuterol rarely. She tries to exercise several times a week.   ROV 04/20/17 --pleasant 83 year old never smoker who carries a diagnosis of sarcoidosis and has documented severe obstructive lung disease and some bronchiectasis by CT chest (last 11/08/16).  Also with a history of coronary disease, esophageal reflux, allergic rhinitis.  She had a URI exposure from son-in-law 2 weeks ago, she started w URI sx 2 weeks ago, assoc  w chest tightness. She saw urgent care 9 days ago > received depo-medrol and 4 days of prednisone.  X-ray showed no infiltrates but some question of a possible right fifth rib osteoblastic lesion.  Continued to have wheeze and cough so was seen for acute evaluation by Dr. Lake Bells 2 days ago. Having yellow mucous from chest and sinuses.  She was started on Augmentin, mucinex for an exacerbation of her bronchiectasis.  She was wheezing some but wanted to try to avoid more prednisone if possible. She is feeling dizzy, has ear  fullness.  Our chest x-ray here did not show any evidence of the right fifth rib lesion.  ROV 07/05/17 --this is a follow-up visit for patient with a history of sarcoidosis, associated obstructive lung disease and bronchiectasis.  She also has coronary disease, cough in the setting of esophageal reflux and allergic rhinitis.  At her last visit at the end of February I treated her with a prednisone taper.  She had just previously been treated for a flare of bronchiectasis with antibiotics.  She had dedicated rib films done on 04/20/2017 that showed old deformities on the right fifth and sixth ribs consistent with healed fractures. She reports that she improved after abx and pred last time. She is using albuterol rarely, although she notes that it helped her exert recently. Uses flonase prn, loratadine daily, omeprazole 40.   ROV 11/13/17 --[name] has a history of coronary disease, sarcoidosis with associated obstructive lung disease and bronchiectasis.  Also chronic cough in the setting of GERD and allergic rhinitis. She reports that her breathing has been doing fairly well. She had to curtail her exercise during the hot humid months. She is able to walk some, 2-3x a week. She has albuterol available, uses about every other week. Cough has been under good control. She still has a lot of nasal gtt, saw Dr Wilburn Cornelia. Still using flonase, loratadine, omeprazole 40mg  daily. Not coughing right now. She underwent a CT  chest 11/09/17 that I reviewed, shows stable bronchiectasis.  She has had the flu shot and her pneumonia shots are UTD.    Review of Systems  Constitutional: Negative for fever and unexpected weight change.  HENT: Negative for congestion, dental problem, ear pain, nosebleeds, postnasal drip, rhinorrhea, sinus pressure, sneezing, sore throat and trouble swallowing.   Eyes: Negative for redness and itching.  Respiratory: Negative for cough, chest tightness, shortness of breath and wheezing.     Cardiovascular: Negative for palpitations and leg swelling.  Gastrointestinal: Negative for nausea and vomiting.  Genitourinary: Negative for dysuria.  Musculoskeletal: Negative for joint swelling.  Skin: Negative for rash.  Neurological: Negative for headaches.  Hematological: Does not bruise/bleed easily.  Psychiatric/Behavioral: Negative for dysphoric mood. The patient is not nervous/anxious.       Objective:   Physical Exam Vitals:   11/13/17 1408  BP: 124/80  Pulse: 66  SpO2: 95%  Weight: 133 lb (60.3 kg)  Height: 5\' 3"  (1.6 m)   Gen: Pleasant, kyphotic elderly woman, in no distress,  normal affect  ENT: No lesions,  mouth clear,  oropharynx clear, no postnasal drip  Neck: No JVD, no stridor  Lungs: No use of accessory muscles, clear without rales or rhonchi  Cardiovascular: RRR, heart sounds normal, no murmur or gallops, no peripheral edema  Musculoskeletal: No deformities, no cyanosis or clubbing  Neuro: alert, non focal  Skin: Warm, no lesions or rashes       Assessment & Plan:  Bronchiectasis without complication (HCC) Overall stable clinically, stable radiographically by CT scan that I reviewed 11/09/2017.  Continue to use albuterol as needed.  I do not think she needs a scheduled bronchodilator at this time in absence of significant respiratory symptoms.  Follow imaging intermittently.  Flu shot, pneumonia shots are up-to-date.  Allergic rhinitis Continue current Flonase, loratadine  GERD Continue current omeprazole 40 mg daily  Baltazar Apo, MD, PhD 11/13/2017, 2:31 PM [facility] [phone] or if no answer [phone]

## 2017-11-13 NOTE — Patient Instructions (Signed)
Please keep your albuterol available to use 2 puffs up to every 4 hours if needed for shortness of breath, chest tightness, wheezing. Please continue your fluticasone nasal spray (Flonase), loratadine (Claritin) as you have been taking them. Please continue omeprazole 40 mg daily. Your CT scan of your chest shows that your bronchiectasis and scarring are completely stable, there has been no increase.  There is no evidence for active sarcoidosis. Your flu shot and pneumonia shots are both up-to-date. Follow with Dr [name] in 6 months or sooner if you have any problems

## 2017-11-13 NOTE — Assessment & Plan Note (Addendum)
Overall stable clinically, stable radiographically by CT scan that I reviewed 11/09/2017.  Continue to use albuterol as needed.  I do not think she needs a scheduled bronchodilator at this time in absence of significant respiratory symptoms.  Follow imaging intermittently.  Flu shot, pneumonia shots are up-to-date.

## 2017-11-13 NOTE — Assessment & Plan Note (Signed)
Continue current omeprazole 40 mg daily

## 2017-11-14 ENCOUNTER — Ambulatory Visit
Admission: RE | Admit: 2017-11-14 | Discharge: 2017-11-14 | Disposition: A | Discharge status: Home / Self Care | Payer: Medicare Other | Source: Ambulatory Visit | Attending: Neurology | Admitting: Neurology

## 2017-11-14 DIAGNOSIS — R413 Other amnesia: Secondary | ICD-10-CM

## 2017-11-14 DIAGNOSIS — H814 Vertigo of central origin: Secondary | ICD-10-CM

## 2017-11-14 DIAGNOSIS — H9122 Sudden idiopathic hearing loss, left ear: Secondary | ICD-10-CM

## 2017-11-14 DIAGNOSIS — R2689 Other abnormalities of gait and mobility: Secondary | ICD-10-CM

## 2017-11-14 DIAGNOSIS — R42 Dizziness and giddiness: Secondary | ICD-10-CM

## 2017-11-14 MED ORDER — GADOBENATE DIMEGLUMINE 529 MG/ML IV SOLN
12.0000 mL | Freq: Once | INTRAVENOUS | Status: AC | PRN
  Administered 2017-11-14: 12 mL via INTRAVENOUS

## 2017-11-21 ENCOUNTER — Telehealth: Payer: Self-pay | Admitting: Neurology

## 2017-11-21 NOTE — Telephone Encounter (Signed)
Spoke with patient and discussed that her MRI showed normal age related findings and nothing acute. Pt educated on healthy habits she can maintain to try to prevent abnormal worsening of the age related changes. Pt stated she had done some research and read that fatigue and dizziness and off balance can be caused by the chronic microvascular ischemia and she asked if her symptoms were related. Also pt asked if Dr. Jaynee Eagles felt that she needed to take a Vitamin B.

## 2017-11-21 NOTE — Telephone Encounter (Signed)
Pt has called asking to discuss the findings of her MRI.  Pt states she has done some research  And has questions as to what she should be doing as a result of the MRI findings

## 2017-11-21 NOTE — Telephone Encounter (Addendum)
Spoke with Dr. Jaynee Eagles. Her changes of microvascular ischemia are normal for age. Everyone has them. Hers are mild and not significant enough to cause these symptoms. Patient can take a Vitamin B supplement but it is not required. Her Vitamin B and B12 were normal when checked prior.

## 2017-11-22 NOTE — Telephone Encounter (Signed)
Spoke with patient and gave her Dr. Cathren Laine message below in response to pt's questions. She verbalized understanding and appreciation.

## 2017-11-27 ENCOUNTER — Other Ambulatory Visit: Payer: Medicare Other | Admitting: *Deleted

## 2017-11-27 DIAGNOSIS — E785 Hyperlipidemia, unspecified: Secondary | ICD-10-CM

## 2017-11-27 DIAGNOSIS — I2510 Atherosclerotic heart disease of native coronary artery without angina pectoris: Secondary | ICD-10-CM

## 2017-11-27 LAB — LIPID PANEL
Chol/HDL Ratio: 2 ratio (ref 0.0–4.4)
Cholesterol, Total: 149 mg/dL (ref 100–199)
HDL: 73 mg/dL (ref >39)
LDL Calculated: 63 mg/dL (ref 0–99)
Triglycerides: 67 mg/dL (ref 0–149)
VLDL Cholesterol Cal: 13 mg/dL (ref 5–40)

## 2017-11-27 LAB — HEPATIC FUNCTION PANEL
ALT: 12 IU/L (ref 0–32)
AST: 17 IU/L (ref 0–40)
Albumin: 4.2 g/dL (ref 3.5–4.7)
Alkaline Phosphatase: 71 IU/L (ref 39–117)
Bilirubin Total: 0.3 mg/dL (ref 0.0–1.2)
Bilirubin, Direct: 0.13 mg/dL (ref 0.00–0.40)
Total Protein: 6.2 g/dL (ref 6.0–8.5)

## 2018-01-11 ENCOUNTER — Ambulatory Visit (HOSPITAL_COMMUNITY)
Admission: RE | Admit: 2018-01-11 | Discharge: 2018-01-11 | Disposition: A | Discharge status: Home / Self Care | Payer: Medicare Other | Source: Ambulatory Visit | Attending: Internal Medicine | Admitting: Internal Medicine

## 2018-01-11 DIAGNOSIS — M810 Age-related osteoporosis without current pathological fracture: Secondary | ICD-10-CM | POA: Diagnosis not present

## 2018-01-11 MED ORDER — DENOSUMAB 60 MG/ML ~~LOC~~ SOSY
60.0000 mg | PREFILLED_SYRINGE | Freq: Once | SUBCUTANEOUS | Status: AC
  Administered 2018-01-11: 60 mg via SUBCUTANEOUS
  Filled 2018-01-11: qty 1

## 2018-01-11 NOTE — Discharge Instructions (Signed)
Denosumab injection °What is this medicine? °DENOSUMAB (den oh sue mab) slows bone breakdown. Prolia is used to treat osteoporosis in women after menopause and in men. Xgeva is used to treat a high calcium level due to cancer and to prevent bone fractures and other bone problems caused by multiple myeloma or cancer bone metastases. Xgeva is also used to treat giant cell tumor of the bone. °This medicine may be used for other purposes; ask your health care provider or pharmacist if you have questions. °COMMON BRAND NAME(S): Prolia, XGEVA °What should I tell my health care provider before I take this medicine? °They need to know if you have any of these conditions: °-dental disease °-having surgery or tooth extraction °-infection °-kidney disease °-low levels of calcium or Vitamin D in the blood °-malnutrition °-on hemodialysis °-skin conditions or sensitivity °-thyroid or parathyroid disease °-an unusual reaction to denosumab, other medicines, foods, dyes, or preservatives °-pregnant or trying to get pregnant °-breast-feeding °How should I use this medicine? °This medicine is for injection under the skin. It is given by a health care professional in a hospital or clinic setting. °If you are getting Prolia, a special MedGuide will be given to you by the pharmacist with each prescription and refill. Be sure to read this information carefully each time. °For Prolia, talk to your pediatrician regarding the use of this medicine in children. Special care may be needed. For Xgeva, talk to your pediatrician regarding the use of this medicine in children. While this drug may be prescribed for children as young as 13 years for selected conditions, precautions do apply. °Overdosage: If you think you have taken too much of this medicine contact a poison control center or emergency room at once. °NOTE: This medicine is only for you. Do not share this medicine with others. °What if I miss a dose? °It is important not to miss your  dose. Call your doctor or health care professional if you are unable to keep an appointment. °What may interact with this medicine? °Do not take this medicine with any of the following medications: °-other medicines containing denosumab °This medicine may also interact with the following medications: °-medicines that lower your chance of fighting infection °-steroid medicines like prednisone or cortisone °This list may not describe all possible interactions. Give your health care provider a list of all the medicines, herbs, non-prescription drugs, or dietary supplements you use. Also tell them if you smoke, drink alcohol, or use illegal drugs. Some items may interact with your medicine. °What should I watch for while using this medicine? °Visit your doctor or health care professional for regular checks on your progress. Your doctor or health care professional may order blood tests and other tests to see how you are doing. °Call your doctor or health care professional for advice if you get a fever, chills or sore throat, or other symptoms of a cold or flu. Do not treat yourself. This drug may decrease your body's ability to fight infection. Try to avoid being around people who are sick. °You should make sure you get enough calcium and vitamin D while you are taking this medicine, unless your doctor tells you not to. Discuss the foods you eat and the vitamins you take with your health care professional. °See your dentist regularly. Brush and floss your teeth as directed. Before you have any dental work done, tell your dentist you are receiving this medicine. °Do not become pregnant while taking this medicine or for 5 months after stopping   it. Talk with your doctor or health care professional about your birth control options while taking this medicine. Women should inform their doctor if they wish to become pregnant or think they might be pregnant. There is a potential for serious side effects to an unborn child. Talk  to your health care professional or pharmacist for more information. What side effects may I notice from receiving this medicine? Side effects that you should report to your doctor or health care professional as soon as possible: -allergic reactions like skin rash, itching or hives, swelling of the face, lips, or tongue -bone pain -breathing problems -dizziness -jaw pain, especially after dental work -redness, blistering, peeling of the skin -signs and symptoms of infection like fever or chills; cough; sore throat; pain or trouble passing urine -signs of low calcium like fast heartbeat, muscle cramps or muscle pain; pain, tingling, numbness in the hands or feet; seizures -unusual bleeding or bruising -unusually weak or tired Side effects that usually do not require medical attention (report to your doctor or health care professional if they continue or are bothersome): -constipation -diarrhea -headache -joint pain -loss of appetite -muscle pain -runny nose -tiredness -upset stomach This list may not describe all possible side effects. Call your doctor for medical advice about side effects. You may report side effects to FDA at 1-800-FDA-1088. Where should I keep my medicine? This medicine is only given in a clinic, doctor's office, or other health care setting and will not be stored at home. NOTE: This sheet is a summary. It may not cover all possible information. If you have questions about this medicine, talk to your doctor, pharmacist, or health care provider.  2018 Elsevier/Gold Standard (2016-03-01 19:17:21)

## 2018-01-31 ENCOUNTER — Encounter: Payer: Self-pay | Admitting: Obstetrics and Gynecology

## 2018-02-06 DIAGNOSIS — R413 Other amnesia: Secondary | ICD-10-CM | POA: Insufficient documentation

## 2018-02-23 ENCOUNTER — Encounter: Payer: Self-pay | Admitting: Podiatry

## 2018-02-23 ENCOUNTER — Ambulatory Visit (INDEPENDENT_AMBULATORY_CARE_PROVIDER_SITE_OTHER): Payer: Medicare Other | Admitting: Podiatry

## 2018-02-23 DIAGNOSIS — L84 Corns and callosities: Secondary | ICD-10-CM | POA: Diagnosis not present

## 2018-02-23 DIAGNOSIS — L600 Ingrowing nail: Secondary | ICD-10-CM | POA: Diagnosis not present

## 2018-02-23 MED ORDER — NEOMYCIN-POLYMYXIN-HC 3.5-10000-1 OT SOLN: OTIC | 0 refills | Status: DC

## 2018-02-23 NOTE — Patient Instructions (Signed)

## 2018-02-24 NOTE — Progress Notes (Signed)
Subjective:   Patient ID: [name], female   DOB: 85 y.o.   MRN: [id number]   HPI Patient presents stating that his left foot is really very sore and making it increasingly hard for me to walk and it seems like it is only lasting for a few weeks now and also this left second nail is increasingly thickened and hard for me to cut or wear shoe gear with   ROS      Objective:  Physical Exam  Neurovascular status found to be intact muscle strength was adequate patient's left second nail being very thickened and dystrophic with moderate pain.  Patient is also noted to have severe lesion subsecond metatarsal left that is painful     Assessment:  Significant damaged ingrown toenail deformity left second toe that is painful and chronic lesion with diminished fat pad second metatarsal left that is not responding well to treatment and debridement at this time     Plan:  H&P reviewed both conditions and recommended nail removal explaining procedure and risk.  Patient signed consent form today I infiltrated the left second toe 60 mg like Marcaine mixture sterile prep applied to the toe and using sterile instrumentation I remove the second nail exposed matrix and applied phenol 3 applications 30 seconds followed by alcohol lavage and sterile dressing.  Gave instructions on soaks debrided plantar lesion subsecond metatarsal left and went ahead today and scanned for customized orthotics with ped orthotist to disperse plantar pressure and that will be made in a soft fashion.  Patient will be seen back by ped orthotist for dispensing and may require further debridement by me

## 2018-02-25 ENCOUNTER — Telehealth: Payer: Self-pay | Admitting: Sports Medicine

## 2018-02-25 NOTE — Telephone Encounter (Signed)
Patient called and said she had a toenail removed on Friday and thinks she may have a blister but isn't sure. When I return her call patient states that she has already spoke to a doctor and does not need any help. -Dr. Cannon Kettle

## 2018-02-26 ENCOUNTER — Encounter: Payer: Self-pay | Admitting: Podiatry

## 2018-02-26 ENCOUNTER — Ambulatory Visit (INDEPENDENT_AMBULATORY_CARE_PROVIDER_SITE_OTHER): Payer: Medicare Other | Admitting: Podiatry

## 2018-02-26 ENCOUNTER — Telehealth: Payer: Self-pay | Admitting: Podiatry

## 2018-02-26 DIAGNOSIS — M2042 Other hammer toe(s) (acquired), left foot: Secondary | ICD-10-CM

## 2018-02-26 DIAGNOSIS — L84 Corns and callosities: Secondary | ICD-10-CM

## 2018-02-26 NOTE — Telephone Encounter (Signed)
This patient calls on Sunday morning stating that her great toe has developed a white blister at the site of her nail removal.  She says she had pain initially after surgery but had no problem on Saturday.  She says she woke up this morning and she had white blister noted at the surgical site.  I told this patient she may be having a reaction to the surgery and or the medicine that she is applying to the site.  I told her to discontinue using her soaking medicine and use soapy sudsy water.  Told her to limit her solution application to the surgical site.  If this problem persists and she is concerned she can make an appointment at the office on Monday morning  Gardiner Barefoot DPM

## 2018-02-28 NOTE — Progress Notes (Signed)
Subjective:   Patient ID: [name], female   DOB: 84 y.o.   MRN: [id number]   HPI Patient presents with blistering of the second digit distal left and also significant structural hammertoe deformity that she states is sore and making it hard for her to be able to wear shoes   ROS      Objective:  Physical Exam  Neurovascular status was intact with patient found to have significant distal contracture digits 2 3 left with pressure against the end of the second toe with nail that healing well with small blister on the plantar surface that is localized with no odor or proximal edema erythema drainage noted     Assessment:  Doing well with nail removal second left but does have hammertoe deformity which I think is the contributing factor to her continued discomfort she is experiencing     Plan:  H&P education reviewed concerning condition and I have recommended at this time that we tried a lift the toe to take pressure off of it and that healing should be uneventful but I gave strict instructions of any further redness drainage pain were to occur or any systemic signs of infection to contact us immediately

## 2018-03-19 ENCOUNTER — Ambulatory Visit: Payer: Medicare Other | Admitting: Orthotics

## 2018-03-19 DIAGNOSIS — M2042 Other hammer toe(s) (acquired), left foot: Secondary | ICD-10-CM

## 2018-03-19 DIAGNOSIS — L84 Corns and callosities: Secondary | ICD-10-CM

## 2018-03-19 NOTE — Progress Notes (Signed)
Patient came in today to pick up custom made foot orthotics.  Patient has shoe with left lift 1/2" so she has about $160.00 tied up in t he shoes.   She isn't real happy the way the [place] fit in her shoes, esp left.  This is the one that is more "built up" with poron to offload painful keratoma.     I sanded off as much as I could from bottom of f/o to make fit better in shoe; more and it would lose its effectiveness to offload area of concern.  She will make a return viisit in a week to revaluate.

## 2018-03-20 NOTE — Progress Notes (Signed)
84 y.o. G63P1001 Widowed White or Caucasian Not Hispanic or Latino female here for annual exam.       She has a h/o OAB, stopped myrbetriq, starting nerve stimulation with Urology. She had her first UTI in years a month ago, needed 2 rounds of antibiotics. She is still having problems. She c/o pain with urination, taking pyridium. She called Urology yesterday, awaiting a call back. Pyridium is helping. Negative urine culture last week. She was told she is emptying her bladder fine.  She has nocturia and leaks at night. Does okay during the day.   She has a know uterine prolapse, c/o some swelling in the vaginal area. Can notice it when she is sitting. Only notices it since having the pain with urination.  She c/o intermittent lower abdominal pain, can go a week or more without pain. Doesn't last long.   She is taking miralax and her BM's are okay.    She feel and broke her elbow last March.  Last March she had some spotting, felt a little irritated.   Patient's last menstrual period was 02/22/1983 (approximate).          Sexually active: No.  The current method of family planning is post menopausal status.    Exercising: Yes.    walking Smoker:  no  Health Maintenance: Pap:  02-24-16 WNL, 12-02-13 WNL  History of abnormal Pap:  No MMG:  04-07-16 U/S WNL, reports she had on in 2019 will call for report Colonoscopy:  09-18-13 polyps  BMD:  05/07/13, T Score -3.8 Left Forearm Total / 13.5 Right Forearm Total / 0.3 Spine Total (hips not calculated due to hardware) followed by PCP. Thinks she had a DEXA in 2018 with Dr. Danna Hefty office  TDaP:  01-14-12 Gardasil: N/A   reports that she has never smoked. She has never used smokeless tobacco. She reports that she does not drink alcohol or use drugs.  Past Medical History:  Diagnosis Date  . Allergy    SEASONAL  . Arthritis   . Asthma    "slight"   . Baker's cyst of knee, right   . CAD (coronary artery disease)    a. Myoview 10/15 - normal  EF 70% // Myoview 11/16: EF 75%, normal perfusion, (there was no blood pressure drop at low level exercise with Lexiscan infusion), low risk study // c. LHC 3/17 - LAD irregs, oLCx 70 (neg FFR), mRCA 30, EF 55-65% >> med Rx  . Carotid stenosis    a. Carotid US 9/15 - Bilateral 1-39% ICA >> FU 2 years // b. Bilateral ICA 1-39 >> FU prn  . Dyspnea    due to Sarcoidosis  . Elbow fracture 04/2017   Right, had surgery  . Esophageal reflux   . Hematochezia   . History of echocardiogram    a. Echo 10/15 - EF 60-65%, no RWMA  . HLD (hyperlipidemia)   . Hx of colonoscopy   . Osteoporosis   . Other chronic pulmonary heart diseases   . Paroxysmal supraventricular tachycardia ([place])   . Pneumonia   . Sarcoidosis     Past Surgical History:  Procedure Laterality Date  . arm surgery Right   . BLADDER SURGERY    . CARDIAC CATHETERIZATION N/A 05/20/2015   Procedure: Left Heart Cath and Coronary Angiography;  Surgeon: Sherren Mocha, MD;  Location: [facility];  Service: Cardiovascular;  Laterality: N/A;  . CARPAL TUNNEL RELEASE Left   . COLONOSCOPY    . FOOT  SURGERY Right   . HIP SURGERY Right 2011   full replacement  . LEG SURGERY Left May 2014   femur fracture s/p open and closed reduction in [place], Dr. Jimmye Norman  . ORIF ELBOW FRACTURE Right 05/09/2017   Procedure: ORIF right olecranon fracture with repair/reconstruction, ulnar nerve transposition as needed;  Surgeon: Roseanne Kaufman, MD;  Location: [facility];  Service: Orthopedics;  Laterality: Right;  Requests 90 mins  . POLYPECTOMY    . SHOULDER ARTHROSCOPY W/ ROTATOR CUFF REPAIR Right   . TRAPEZIUM RESECTION Right     Current Outpatient Medications  Medication Sig Dispense Refill  . acetaminophen (TYLENOL) 500 MG tablet Take 1,000 mg by mouth every 6 (six) hours as needed.    Marland Kitchen albuterol (PROVENTIL HFA;VENTOLIN HFA) 108 (90 Base) MCG/ACT inhaler Inhale 2 puffs every 4 (four) hours as needed into the lungs for wheezing or shortness of  breath. 1 Inhaler 6  . aspirin EC 81 MG tablet Take 1 tablet (81 mg total) by mouth daily.    . calcium carbonate (TUMS - DOSED IN MG ELEMENTAL CALCIUM) 500 MG chewable tablet Chew 1 tablet by mouth daily. Pt isn't sure of dose.    Marland Kitchen CARTIA XT 180 MG 24 hr capsule TAKE 1 CAPSULE(180 MG) BY MOUTH DAILY 90 capsule 2  . cetirizine (ZYRTEC) 10 MG tablet Take 10 mg by mouth daily.    Marland Kitchen denosumab (PROLIA) 60 MG/ML SOLN injection Inject 60 mg into the skin every 6 (six) months. Administer in upper arm, thigh, or abdomen    . fluticasone (FLONASE) 50 MCG/ACT nasal spray Place into both nostrils as needed for allergies or rhinitis.    Marland Kitchen loratadine (CLARITIN) 10 MG tablet Take 1 tablet (10 mg total) by mouth daily. 30 tablet 11  . nitroGLYCERIN (NITROSTAT) 0.4 MG SL tablet Place 1 tablet (0.4 mg total) under the tongue every 5 (five) minutes as needed for chest pain. 25 tablet 11  . omeprazole (PRILOSEC) 40 MG capsule Take 40 mg by mouth daily.     . Vitamin D, Ergocalciferol, (DRISDOL) 50000 units CAPS capsule Take 50,000 Units by mouth every 7 (seven) days.    Marland Kitchen neomycin-polymyxin b-dexamethasone (MAXITROL) 3.5-10000-0.1 OINT neomycin 3.5 mg/g-polymyxin B 10,000 unit/g-dexameth 0.1 % eye oint    . neomycin-polymyxin-hydrocortisone (CORTISPORIN) OTIC solution Apply 1-2 drops to toe after soaking twice a day (Patient not taking: Reported on 03/21/2018) 10 mL 0  . ofloxacin (OCUFLOX) 0.3 % ophthalmic solution ofloxacin 0.3 % eye drops    . Respiratory Therapy Supplies (FLUTTER) DEVI Use as directed (Patient not taking: Reported on 03/21/2018) 1 each 0  . rosuvastatin (CRESTOR) 10 MG tablet Take 1 tablet (10 mg total) by mouth as directed. Take 3 times a week 90 tablet 3  . traMADol (ULTRAM) 50 MG tablet Take 50 mg by mouth every 6 (six) hours as needed for severe pain.      No current facility-administered medications for this visit.     Family History  Problem Relation Age of Onset  . Colon cancer Mother  1  . Cancer Mother   . Lung cancer Father   . Heart disease Father   . Arrhythmia Father   . Cancer Father   . Cancer Sister        ovarian  . Heart attack Brother   . Esophageal cancer Neg Hx   . Rectal cancer Neg Hx   . Stomach cancer Neg Hx   . Stroke Neg Hx  Review of Systems  Constitutional: Negative.   HENT: Negative.   Eyes: Negative.   Respiratory: Negative.   Cardiovascular: Negative.   Gastrointestinal: Negative.   Endocrine: Negative.   Genitourinary: Positive for dysuria, frequency, pelvic pain and urgency.  Musculoskeletal: Negative.   Skin: Negative.   Allergic/Immunologic: Negative.   Neurological: Negative.   Psychiatric/Behavioral: Negative.     Exam:   BP 122/68 (BP Location: Right Arm, Patient Position: Sitting, Cuff Size: Normal)   Pulse 64   Ht 5\' 3"  (1.6 m)   Wt 132 lb 9.6 oz (60.1 kg)   LMP 02/22/1983 (Approximate)   BMI 23.49 kg/m   Weight change: @WEIGHTCHANGE @ Height:   Height: 5\' 3"  (160 cm)  Ht Readings from Last 3 Encounters:  03/21/18 5\' 3"  (1.6 m)  11/13/17 5\' 3"  (1.6 m)  11/02/17 5\' 3"  (1.6 m)    General appearance: alert, cooperative and appears stated age Head: Normocephalic, without obvious abnormality, atraumatic Neck: no adenopathy, supple, symmetrical, trachea midline and thyroid normal to inspection and palpation Lungs: clear to auscultation bilaterally Cardiovascular: regular rate and rhythm Breasts: normal appearance, no masses or tenderness Abdomen: soft, non-tender; non distended,  no masses,  no organomegaly Extremities: extremities normal, atraumatic, no cyanosis or edema Skin: Skin color, texture, turgor normal. No rashes or lesions Lymph nodes: Cervical, supraclavicular, and axillary nodes normal. No abnormal inguinal nodes palpated Neurologic: Grossly normal   Pelvic: External genitalia:  no lesions              Urethra:  normal appearing urethra with no masses, tenderness or lesions               Bartholins and Skenes: normal                 Vagina: atrophic appearing vagina with normal color and discharge, no lesions  Vaginal opening is small, she does have uterine prolapse. With valsalva 1 cm above introitus. Minimal cystocele              Cervix: no lesions               Bimanual Exam:  Uterus:  normal size, contour, position, consistency, mobility, non-tender              Adnexa: no mass, fullness, tenderness               Rectovaginal: Confirms               Anus:  normal sphincter tone, no lesions  Chaperone was present for exam.  A:  Well Woman with normal exam  OAB, being managed by Urology  Dysuria, long term, will f/u with Urology.  Genital prolapse  Vaginal atrophy, possibly contributing to her bladder symptoms  Spotting last year  P:   Pap   Return for GYN ultrasound  Send urine for ua, c&s  Can use pyridium until she see's her primary  Mammogram next month  DEXA with primary  Discussed breast self exam  Discussed calcium and vit D intake  Will try low dose vaginal estrogen

## 2018-03-21 ENCOUNTER — Other Ambulatory Visit: Payer: Self-pay

## 2018-03-21 ENCOUNTER — Encounter: Payer: Self-pay | Admitting: Obstetrics and Gynecology

## 2018-03-21 ENCOUNTER — Ambulatory Visit (INDEPENDENT_AMBULATORY_CARE_PROVIDER_SITE_OTHER): Payer: Medicare Other | Admitting: Obstetrics and Gynecology

## 2018-03-21 VITALS — BP 122/68 | HR 64 | Ht 63.0 in | Wt 132.6 lb

## 2018-03-21 DIAGNOSIS — N814 Uterovaginal prolapse, unspecified: Secondary | ICD-10-CM | POA: Diagnosis not present

## 2018-03-21 DIAGNOSIS — N952 Postmenopausal atrophic vaginitis: Secondary | ICD-10-CM

## 2018-03-21 DIAGNOSIS — R300 Dysuria: Secondary | ICD-10-CM | POA: Diagnosis not present

## 2018-03-21 DIAGNOSIS — N950 Postmenopausal bleeding: Secondary | ICD-10-CM | POA: Diagnosis not present

## 2018-03-21 DIAGNOSIS — Z01419 Encounter for gynecological examination (general) (routine) without abnormal findings: Secondary | ICD-10-CM

## 2018-03-21 DIAGNOSIS — Z124 Encounter for screening for malignant neoplasm of cervix: Secondary | ICD-10-CM | POA: Diagnosis not present

## 2018-03-21 DIAGNOSIS — R351 Nocturia: Secondary | ICD-10-CM

## 2018-03-21 LAB — POCT URINALYSIS DIPSTICK
BILIRUBIN UA: NEGATIVE
GLUCOSE UA: NEGATIVE
Ketones, UA: NEGATIVE
LEUKOCYTES UA: NEGATIVE
Nitrite, UA: NEGATIVE
Protein, UA: POSITIVE — AB
RBC UA: POSITIVE
Spec Grav, UA: 1.01 (ref 1.010–1.025)
Urobilinogen, UA: NEGATIVE E.U./dL — AB
pH, UA: 5 (ref 5.0–8.0)

## 2018-03-21 MED ORDER — ESTRADIOL 0.1 MG/GM VA CREA: TOPICAL_CREAM | VAGINAL | 1 refills | Status: DC

## 2018-03-21 NOTE — Patient Instructions (Addendum)
EXERCISE AND DIET:  We recommended that you start or continue a regular exercise program for good health. Regular exercise means any activity that makes your heart beat faster and makes you sweat.  We recommend exercising at least 30 minutes per day at least 3 days a week, preferably 4 or 5.  We also recommend a diet low in fat and sugar.  Inactivity, poor dietary choices and obesity can cause diabetes, heart attack, stroke, and kidney damage, among others.    ALCOHOL AND SMOKING:  Women should limit their alcohol intake to no more than 7 drinks/beers/glasses of wine (combined, not each!) per week. Moderation of alcohol intake to this level decreases your risk of breast cancer and liver damage. And of course, no recreational drugs are part of a healthy lifestyle.  And absolutely no smoking or even second hand smoke. Most people know smoking can cause heart and lung diseases, but did you know it also contributes to weakening of your bones? Aging of your skin?  Yellowing of your teeth and nails?  CALCIUM AND VITAMIN D:  Adequate intake of calcium and Vitamin D are recommended.  The recommendations for exact amounts of these supplements seem to change often, but generally speaking 1,200 mg of calcium (between diet and supplement) and 800 units of Vitamin D per day seems prudent. Certain women may benefit from higher intake of Vitamin D.  If you are among these women, your doctor will have told you during your visit.    PAP SMEARS:  Pap smears, to check for cervical cancer or precancers,  have traditionally been done yearly, although recent scientific advances have shown that most women can have pap smears less often.  However, every woman still should have a physical exam from her gynecologist every year. It will include a breast check, inspection of the vulva and vagina to check for abnormal growths or skin changes, a visual exam of the cervix, and then an exam to evaluate the size and shape of the uterus and  ovaries.  And after 82 years of age, a rectal exam is indicated to check for rectal cancers. We will also provide age appropriate advice regarding health maintenance, like when you should have certain vaccines, screening for sexually transmitted diseases, bone density testing, colonoscopy, mammograms, etc.   MAMMOGRAMS:  All women over 40 years old should have a yearly mammogram. Many facilities now offer a "3D" mammogram, which may cost around $50 extra out of pocket. If possible,  we recommend you accept the option to have the 3D mammogram performed.  It both reduces the number of women who will be called back for extra views which then turn out to be normal, and it is better than the routine mammogram at detecting truly abnormal areas.    COLON CANCER SCREENING: Now recommend starting at age 45. At this time colonoscopy is not covered for routine screening until 50. There are take home tests that can be done between 45-49.   COLONOSCOPY:  Colonoscopy to screen for colon cancer is recommended for all women at age 50.  We know, you hate the idea of the prep.  We agree, BUT, having colon cancer and not knowing it is worse!!  Colon cancer so often starts as a polyp that can be seen and removed at colonscopy, which can quite literally save your life!  And if your first colonoscopy is normal and you have no family history of colon cancer, most women don't have to have it again for   10 years.  Once every ten years, you can do something that may end up saving your life, right?  We will be happy to help you get it scheduled when you are ready.  Be sure to check your insurance coverage so you understand how much it will cost.  It may be covered as a preventative service at no cost, but you should check your particular policy.      Breast Self-Awareness Breast self-awareness means being familiar with how your breasts look and feel. It involves checking your breasts regularly and reporting any changes to your  health care provider. Practicing breast self-awareness is important. A change in your breasts can be a sign of a serious medical problem. Being familiar with how your breasts look and feel allows you to find any problems early, when treatment is more likely to be successful. All women should practice breast self-awareness, including women who have had breast implants. How to do a breast self-exam One way to learn what is normal for your breasts and whether your breasts are changing is to do a breast self-exam. To do a breast self-exam: Look for Changes  1. Remove all the clothing above your waist. 2. Stand in front of a mirror in a room with good lighting. 3. Put your hands on your hips. 4. Push your hands firmly downward. 5. Compare your breasts in the mirror. Look for differences between them (asymmetry), such as: ? Differences in shape. ? Differences in size. ? Puckers, dips, and bumps in one breast and not the other. 6. Look at each breast for changes in your skin, such as: ? Redness. ? Scaly areas. 7. Look for changes in your nipples, such as: ? Discharge. ? Bleeding. ? Dimpling. ? Redness. ? A change in position. Feel for Changes Carefully feel your breasts for lumps and changes. It is best to do this while lying on your back on the floor and again while sitting or standing in the shower or tub with soapy water on your skin. Feel each breast in the following way:  Place the arm on the side of the breast you are examining above your head.  Feel your breast with the other hand.  Start in the nipple area and make  inch (2 cm) overlapping circles to feel your breast. Use the pads of your three middle fingers to do this. Apply light pressure, then medium pressure, then firm pressure. The light pressure will allow you to feel the tissue closest to the skin. The medium pressure will allow you to feel the tissue that is a little deeper. The firm pressure will allow you to feel the tissue  close to the ribs.  Continue the overlapping circles, moving downward over the breast until you feel your ribs below your breast.  Move one finger-width toward the center of the body. Continue to use the  inch (2 cm) overlapping circles to feel your breast as you move slowly up toward your collarbone.  Continue the up and down exam using all three pressures until you reach your armpit.  Write Down What You Find  Write down what is normal for each breast and any changes that you find. Keep a written record with breast changes or normal findings for each breast. By writing this information down, you do not need to depend only on memory for size, tenderness, or location. Write down where you are in your menstrual cycle, if you are still menstruating. If you are having trouble noticing differences   in your breasts, do not get discouraged. With time you will become more familiar with the variations in your breasts and more comfortable with the exam. How often should I examine my breasts? Examine your breasts every month. If you are breastfeeding, the best time to examine your breasts is after a feeding or after using a breast pump. If you menstruate, the best time to examine your breasts is 5-7 days after your period is over. During your period, your breasts are lumpier, and it may be more difficult to notice changes. When should I see my health care provider? See your health care provider if you notice:  A change in shape or size of your breasts or nipples.  A change in the skin of your breast or nipples, such as a reddened or scaly area.  Unusual discharge from your nipples.  A lump or thick area that was not there before.  Pain in your breasts.  Anything that concerns you.  Pelvic Organ Prolapse Pelvic organ prolapse is the stretching, bulging, or dropping of pelvic organs into an abnormal position. It happens when the muscles and tissues that surround and support pelvic structures become  weak or stretched. Pelvic organ prolapse can involve the:  Vagina (vaginal prolapse).  Uterus (uterine prolapse).  Bladder (cystocele).  Rectum (rectocele).  Intestines (enterocele). When organs other than the vagina are involved, they often bulge into the vagina or protrude from the vagina, depending on how severe the prolapse is. What are the causes? This condition may be caused by:  Pregnancy, labor, and childbirth.  Past pelvic surgery.  Decreased production of the hormone estrogen associated with menopause.  Consistently lifting more than 50 lb (23 kg).  Obesity.  Long-term inability to pass stool (chronic constipation).  A cough that lasts a long time (chronic).  Buildup of fluid in the abdomen due to certain diseases and other conditions. What are the signs or symptoms? Symptoms of this condition include:  Passing a little urine (loss of bladder control) when you cough, sneeze, strain, and exercise (stress incontinence). This may be worse immediately after childbirth. It may gradually improve over time.  Feeling pressure in your pelvis or vagina. This pressure may increase when you cough or when you are passing stool.  A bulge that protrudes from the opening of your vagina.  Difficulty passing urine or stool.  Pain in your lower back.  Pain, discomfort, or disinterest in sex.  Repeated bladder infections (urinary tract infections).  Difficulty inserting a tampon. In some people, this condition causes no symptoms. How is this diagnosed? This condition may be diagnosed based on a vaginal and rectal exam. During the exam, you may be asked to cough and strain while you are lying down, sitting, and standing up. Your health care provider will determine if other tests are required, such as bladder function tests. How is this treated? Treatment for this condition may depend on your symptoms. Treatment may include:  Lifestyle changes, such as changes to your  diet.  Emptying your bladder at scheduled times (bladder training therapy). This can help reduce or avoid urinary incontinence.  Estrogen. Estrogen may help mild prolapse by increasing the strength and tone of pelvic floor muscles.  Kegel exercises. These may help mild cases of prolapse by strengthening and tightening the muscles of the pelvic floor.  A soft, flexible device that helps support the vaginal walls and keep pelvic organs in place (pessary). This is inserted into your vagina by your health care provider.  Surgery. This is often the only form of treatment for severe prolapse. Follow these instructions at home:  Avoid drinking beverages that contain caffeine or alcohol.  Increase your intake of high-fiber foods. This can help decrease constipation and straining during bowel movements.  Lose weight if recommended by your health care provider.  Wear a sanitary pad or adult diapers if you have urinary incontinence.  Avoid heavy lifting and straining with exercise and work. Do not hold your breath when you perform mild to moderate lifting and exercise activities. Limit your activities as directed by your health care provider.  Do Kegel exercises as directed by your health care provider. To do this: ? Squeeze your pelvic floor muscles tight. You should feel a tight lift in your rectal area and a tightness in your vaginal area. Keep your stomach, buttocks, and legs relaxed. ? Hold the muscles tight for up to 10 seconds. ? Relax your muscles. ? Repeat this exercise 50 times a day, or as many times as told by your health care provider. Continue to do this exercise for at least 4-6 weeks, or for as long as told by your health care provider.  Take over-the-counter and prescription medicines only as told by your health care provider.  If you have a pessary, take care of it as told by your health care provider.  Keep all follow-up visits as told by your health care provider. This is  important. Contact a health care provider if you:  Have symptoms that interfere with your daily activities or sex life.  Need medicine to help with the discomfort.  Notice bleeding from your vagina that is not related to your period.  Have a fever.  Have pain or bleeding when you urinate.  Have bleeding when you pass stool.  Pass urine when you have sex.  Have chronic constipation.  Have a pessary that falls out.  Have bad smelling vaginal discharge.  Have an unusual, low pain in your abdomen. Summary  Pelvic organ prolapse is the stretching, bulging, or dropping of pelvic organs into an abnormal position. It happens when the muscles and tissues that surround and support pelvic structures become weak or stretched.  When organs other than the vagina are involved, they often bulge into the vagina or protrude from the vagina, depending on how severe the prolapse is.  In most cases, this condition needs to be treated only if it produces symptoms. Treatment may include lifestyle changes, estrogen, Kegel exercises, pessary insertion, or surgery.  Avoid heavy lifting and straining with exercise and work. Do not hold your breath when you perform mild to moderate lifting and exercise activities. Limit your activities as directed by your health care provider. This information is not intended to replace advice given to you by your health care provider. Make sure you discuss any questions you have with your health care provider. Document Released: 09/04/2013 Document Revised: 03/01/2017 Document Reviewed: 03/01/2017 Elsevier Interactive Patient Education  2019 [employer].

## 2018-03-22 ENCOUNTER — Other Ambulatory Visit (HOSPITAL_COMMUNITY)
Admission: RE | Admit: 2018-03-22 | Discharge: 2018-03-22 | Disposition: A | Discharge status: Home / Self Care | Payer: Medicare Other | Source: Ambulatory Visit | Attending: Obstetrics and Gynecology | Admitting: Obstetrics and Gynecology

## 2018-03-22 DIAGNOSIS — N950 Postmenopausal bleeding: Secondary | ICD-10-CM | POA: Diagnosis present

## 2018-03-22 DIAGNOSIS — Z124 Encounter for screening for malignant neoplasm of cervix: Secondary | ICD-10-CM | POA: Insufficient documentation

## 2018-03-22 LAB — URINALYSIS, MICROSCOPIC ONLY
Bacteria, UA: NONE SEEN
Casts: NONE SEEN /lpf

## 2018-03-22 LAB — URINE CULTURE

## 2018-03-22 NOTE — Addendum Note (Signed)
Addended by: Dorothy Spark on: 03/22/2018 05:43 PM   Modules accepted: Orders

## 2018-03-27 ENCOUNTER — Ambulatory Visit: Payer: Medicare Other | Admitting: Orthotics

## 2018-03-27 ENCOUNTER — Other Ambulatory Visit: Payer: Self-pay

## 2018-03-27 ENCOUNTER — Ambulatory Visit (INDEPENDENT_AMBULATORY_CARE_PROVIDER_SITE_OTHER): Payer: Medicare Other | Admitting: Obstetrics and Gynecology

## 2018-03-27 ENCOUNTER — Ambulatory Visit (INDEPENDENT_AMBULATORY_CARE_PROVIDER_SITE_OTHER): Payer: Medicare Other

## 2018-03-27 ENCOUNTER — Encounter: Payer: Self-pay | Admitting: Obstetrics and Gynecology

## 2018-03-27 VITALS — BP 116/62 | HR 64 | Wt 136.0 lb

## 2018-03-27 DIAGNOSIS — R109 Unspecified abdominal pain: Secondary | ICD-10-CM | POA: Diagnosis not present

## 2018-03-27 DIAGNOSIS — N3281 Overactive bladder: Secondary | ICD-10-CM | POA: Diagnosis not present

## 2018-03-27 DIAGNOSIS — L84 Corns and callosities: Secondary | ICD-10-CM

## 2018-03-27 DIAGNOSIS — R102 Pelvic and perineal pain: Secondary | ICD-10-CM | POA: Diagnosis not present

## 2018-03-27 DIAGNOSIS — N950 Postmenopausal bleeding: Secondary | ICD-10-CM

## 2018-03-27 LAB — CYTOLOGY - PAP: Diagnosis: NEGATIVE

## 2018-03-27 NOTE — Progress Notes (Signed)
Took some material off foot orthotic to better fit in shoe.

## 2018-03-27 NOTE — Progress Notes (Signed)
GYNECOLOGY  VISIT   HPI: 84 y.o.   Widowed White or Caucasian Not Hispanic or Latino  female   [id number] with Patient's last menstrual period was 02/22/1983 (approximate).   here for f/u of an episode of PMP spotting and c/o intermittent lower abdominal pain. She has a h/o mild genital prolapse and multiple bladder c/o (followed by urology). She was just given a script for vaginal estrogen last week hoping that it would help her bladder issues. She hasn't picked it up yet, because of the epense    GYNECOLOGIC HISTORY: Patient's last menstrual period was 02/22/1983 (approximate). Contraception:NA Menopausal hormone therapy: vaginal estrogen        OB History    Gravida  1   Para  1   Term  1   Preterm  0   AB  0   Living  1     SAB  0   TAB  0   Ectopic  0   Multiple  0   Live Births  1              Patient Active Problem List   Diagnosis Date Noted  . ETD (Eustachian tube dysfunction), bilateral 11/07/2017  . Chronic nonintractable headache 11/07/2017  . Aftercare 06/06/2017  . Closed fracture of right olecranon process 05/09/2017  . Pain in joint of right elbow 05/02/2017  . Rib lesion 04/20/2017  . Bronchiectasis without complication ([place]) 62/83/1517  . Allergic rhinitis 09/15/2015  . CAD (coronary artery disease) 05/18/2015  . Essential hypertension 05/18/2015  . Carotid artery disease ([place]) 05/18/2015  . Cough 03/19/2015  . Left foot pain 09/02/2014  . DOE (dyspnea on exertion) 12/26/2013  . Abnormality of gait 11/06/2013  . Scoliosis (and kyphoscoliosis), idiopathic 07/30/2013  . 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 Constipation 09/05/2012  . History of sinus tachycardia 09/05/2012  . Closed left subtrochanteric femur fracture S/P Open/closed and reduction, internal medullary fixation  09/05/2012  . Acute posthemorrhagic anemia 08/22/2012  . Lumbar radiculopathy 11/23/2011  . Leg weakness 11/23/2011  . Mitral regurgitation 11/22/2011   . DIARRHEA-PRESUMED INFECTIOUS 08/18/2008  . RECTAL BLEEDING 08/18/2008  . PERSONAL HX COLONIC POLYPS 08/18/2008  . DEGENERATIVE JOINT DISEASE 08/14/2008  . SKIN CANCER, HX OF 08/14/2008  . CARPAL TUNNEL SYNDROME, HX OF 08/14/2008  . SARCOIDOSIS, PULMONARY 08/08/2008  . HLD (hyperlipidemia) 08/08/2008  . Paroxysmal supraventricular tachycardia ([place]) 08/08/2008  . GERD 08/08/2008    Past Medical History:  Diagnosis Date  . Allergy    SEASONAL  . Arthritis   . Asthma    "slight"   . Baker's cyst of knee, right   . CAD (coronary artery disease)    a. Myoview 10/15 - normal EF 70% // Myoview 11/16: EF 75%, normal perfusion, (there was no blood pressure drop at low level exercise with Lexiscan infusion), low risk study // c. LHC 3/17 - LAD irregs, oLCx 70 (neg FFR), mRCA 30, EF 55-65% >> med Rx  . Carotid stenosis    a. Carotid US 9/15 - Bilateral 1-39% ICA >> FU 2 years // b. Bilateral ICA 1-39 >> FU prn  . Dyspnea    due to Sarcoidosis  . Elbow fracture 04/2017   Right, had surgery  . Esophageal reflux   . Hematochezia   . History of echocardiogram    a. Echo 10/15 - EF 60-65%, no RWMA  . HLD (hyperlipidemia)   . Hx of colonoscopy   . Osteoporosis   . Other  chronic pulmonary heart diseases   . Paroxysmal supraventricular tachycardia ([place])   . Pneumonia   . Sarcoidosis     Past Surgical History:  Procedure Laterality Date  . arm surgery Right   . BLADDER SURGERY    . CARDIAC CATHETERIZATION N/A 05/20/2015   Procedure: Left Heart Cath and Coronary Angiography;  Surgeon: Sherren Mocha, MD;  Location: [facility];  Service: Cardiovascular;  Laterality: N/A;  . CARPAL TUNNEL RELEASE Left   . COLONOSCOPY    . FOOT SURGERY Right   . HIP SURGERY Right 2011   full replacement  . LEG SURGERY Left May 2014   femur fracture s/p open and closed reduction in [place], Dr. Jimmye Norman  . ORIF ELBOW FRACTURE Right 05/09/2017   Procedure: ORIF right olecranon fracture with  repair/reconstruction, ulnar nerve transposition as needed;  Surgeon: Roseanne Kaufman, MD;  Location: [facility];  Service: Orthopedics;  Laterality: Right;  Requests 90 mins  . POLYPECTOMY    . SHOULDER ARTHROSCOPY W/ ROTATOR CUFF REPAIR Right   . TRAPEZIUM RESECTION Right     Current Outpatient Medications  Medication Sig Dispense Refill  . acetaminophen (TYLENOL) 500 MG tablet Take 1,000 mg by mouth every 6 (six) hours as needed.    Marland Kitchen albuterol (PROVENTIL HFA;VENTOLIN HFA) 108 (90 Base) MCG/ACT inhaler Inhale 2 puffs every 4 (four) hours as needed into the lungs for wheezing or shortness of breath. 1 Inhaler 6  . aspirin EC 81 MG tablet Take 1 tablet (81 mg total) by mouth daily.    . calcium carbonate (TUMS - DOSED IN MG ELEMENTAL CALCIUM) 500 MG chewable tablet Chew 1 tablet by mouth daily. Pt isn't sure of dose.    Marland Kitchen CARTIA XT 180 MG 24 hr capsule TAKE 1 CAPSULE(180 MG) BY MOUTH DAILY 90 capsule 2  . cetirizine (ZYRTEC) 10 MG tablet Take 10 mg by mouth daily.    Marland Kitchen denosumab (PROLIA) 60 MG/ML SOLN injection Inject 60 mg into the skin every 6 (six) months. Administer in upper arm, thigh, or abdomen    . estradiol (ESTRACE) 0.1 MG/GM vaginal cream 1 gram vaginally twice weekly 42.5 g 1  . fluticasone (FLONASE) 50 MCG/ACT nasal spray Place into both nostrils as needed for allergies or rhinitis.    Marland Kitchen loratadine (CLARITIN) 10 MG tablet Take 1 tablet (10 mg total) by mouth daily. 30 tablet 11  . neomycin-polymyxin b-dexamethasone (MAXITROL) 3.5-10000-0.1 OINT neomycin 3.5 mg/g-polymyxin B 10,000 unit/g-dexameth 0.1 % eye oint    . neomycin-polymyxin-hydrocortisone (CORTISPORIN) OTIC solution Apply 1-2 drops to toe after soaking twice a day (Patient not taking: Reported on 03/21/2018) 10 mL 0  . nitroGLYCERIN (NITROSTAT) 0.4 MG SL tablet Place 1 tablet (0.4 mg total) under the tongue every 5 (five) minutes as needed for chest pain. 25 tablet 11  . ofloxacin (OCUFLOX) 0.3 % ophthalmic solution ofloxacin  0.3 % eye drops    . omeprazole (PRILOSEC) 40 MG capsule Take 40 mg by mouth daily.     Marland Kitchen Respiratory Therapy Supplies (FLUTTER) DEVI Use as directed (Patient not taking: Reported on 03/21/2018) 1 each 0  . rosuvastatin (CRESTOR) 10 MG tablet Take 1 tablet (10 mg total) by mouth as directed. Take 3 times a week 90 tablet 3  . traMADol (ULTRAM) 50 MG tablet Take 50 mg by mouth every 6 (six) hours as needed for severe pain.     . Vitamin D, Ergocalciferol, (DRISDOL) 50000 units CAPS capsule Take 50,000 Units by mouth every 7 (seven)  days.     No current facility-administered medications for this visit.      ALLERGIES: Levaquin [levofloxacin]; Sulfonamide derivatives; Morphine; Oxycodone-acetaminophen; and Percocet [oxycodone-acetaminophen]  Family History  Problem Relation Age of Onset  . Colon cancer Mother 6  . Cancer Mother   . Lung cancer Father   . Heart disease Father   . Arrhythmia Father   . Cancer Father   . Cancer Sister        ovarian  . Heart attack Brother   . Esophageal cancer Neg Hx   . Rectal cancer Neg Hx   . Stomach cancer Neg Hx   . Stroke Neg Hx     Social History   Socioeconomic History  . Marital status: Widowed    Spouse name: Not on file  . Number of children: 1  . Years of education: Not on file  . Highest education level: Some college, no degree  Occupational History  . Occupation: retired    Comment: still works as Scientist, 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  Social Needs  . Financial resource strain: Not on file  . Food insecurity:    Worry: Not on file    Inability: Not on file  . Transportation needs:    Medical: Not on file    Non-medical: Not on file  Tobacco Use  . Smoking status: Never Smoker  . Smokeless tobacco: Never Used  Substance and Sexual Activity  . Alcohol use: No  . Drug use: No  . Sexual activity: Never    Partners: Male    Birth control/protection: Post-menopausal  Lifestyle  . Physical activity:    Days per week:  Not on file    Minutes per session: Not on file  . Stress: Not on file  Relationships  . Social connections:    Talks on phone: Not on file    Gets together: Not on file    Attends religious service: Not on file    Active member of club or organization: Not on file    Attends meetings of clubs or organizations: Not on file    Relationship status: Not on file  . Intimate partner violence:    Fear of current or ex partner: Not on file    Emotionally abused: Not on file    Physically abused: Not on file    Forced sexual activity: Not on file  Other Topics Concern  . Not on file  Social History Narrative   Lives with daughter and son in law   Caffeine use:  Coffee 1 per day   Right handed    Review of Systems  Genitourinary: Positive for dysuria and urgency.       Nocturia  All other systems reviewed and are negative.   PHYSICAL EXAMINATION:    LMP 02/22/1983 (Approximate)     General appearance: alert, cooperative and appears stated age  Ultrasound images reviewed with the patient. Small uterus with endometrial stripe under 4 mm, endometrium is echogenic without blood flow  ASSESSMENT Episode of PMP spotting, thin stripe on ultrasound Mild genital prolapse, tolerable Intermittent lower abdominal/pelvic pain, no ultrasound abnormalities OAB symptoms, some dysuria, normal micro ua and negative culture.    PLAN Call with any vaginal bleeding, would further evaluate She is f/u with Urology for her OAB symptoms, dysuria Will try the vaginal estrogen to see if it helps    An After Visit Summary was printed and given to the patient.

## 2018-04-16 ENCOUNTER — Encounter: Payer: Self-pay | Admitting: Obstetrics and Gynecology

## 2018-04-30 ENCOUNTER — Ambulatory Visit (INDEPENDENT_AMBULATORY_CARE_PROVIDER_SITE_OTHER): Payer: Medicare Other | Admitting: Emergency Medicine

## 2018-04-30 ENCOUNTER — Encounter: Payer: Self-pay | Admitting: Emergency Medicine

## 2018-04-30 DIAGNOSIS — J301 Allergic rhinitis due to pollen: Secondary | ICD-10-CM | POA: Diagnosis not present

## 2018-04-30 DIAGNOSIS — J471 Bronchiectasis with (acute) exacerbation: Secondary | ICD-10-CM

## 2018-04-30 DIAGNOSIS — K219 Gastro-esophageal reflux disease without esophagitis: Secondary | ICD-10-CM

## 2018-04-30 DIAGNOSIS — D869 Sarcoidosis, unspecified: Secondary | ICD-10-CM | POA: Diagnosis not present

## 2018-04-30 MED ORDER — PREDNISONE 10 MG PO TABS: ORAL_TABLET | ORAL | 0 refills | Status: DC

## 2018-04-30 MED ORDER — DOXYCYCLINE HYCLATE 100 MG PO TABS: 100.0000 mg | ORAL_TABLET | Freq: Two times a day (BID) | ORAL | 0 refills | Status: DC

## 2018-04-30 NOTE — Assessment & Plan Note (Signed)
Without any evidence of significant parenchymal disease or lymphadenopathy on most recent imaging.  Consider repeat imaging if she does not improve as expected with treatment of this acute flare.

## 2018-04-30 NOTE — Assessment & Plan Note (Signed)
Patient has bronchiectasis in the setting of sarcoidosis.  She has symptoms consistent with an acute flare, unclear as to whether this started as a viral URI versus increased congestion associated with seasonal allergies in the beginning of spring.  She is having colored mucus, some mild wheeze on exam.  I will treat her for an acute exacerbation with prednisone and doxycycline, follow-up to ensure improvement at her already scheduled appointment in about a week.  She has albuterol that she can use as needed.

## 2018-04-30 NOTE — Progress Notes (Signed)
Subjective:    Patient ID: [name], female    DOB: 07/16/1934, 84 y.o.   MRN: [id number]  HPI 84 yo woman, never smoker, hx sarcoidosis that was dx by Dr [name] in [place] after FOB (? Whether she had biopsies or not), PSVT, GERD. She also had a hx severe PNA with empyema and chest tube (90's). She has DOE and difficulty walking. The decrease in function has come on gradually. Her cardiac stress test was negative as below. PFT show obstruction with a DLCO defect.   PFT 12/06/13 >> severe AFL without BD response.          Acute OV 04/30/2018 --[name] is 86, follows here for sarcoidosis with associated obstructive lung disease and bronchiectasis.  She also has a history of coronary artery disease, GERD, allergic rhinitis with associated cough.  She is on zyrtec, omeprazole.  Her last chest imaging was done 11/09/2017, showed stable bronchiectasis.  She is here today reporting increased nasal congestion and drainage beginning two weeks ago. Then sore throat about 4-5 days ago. Increased dyspnea, cough with yellowish mucous, chest tightness. No known sick contacts, no travel. No fevers. She restarted her nasal steroid about 4 days ago.    Review of Systems  Constitutional: Negative for fever and unexpected weight change.  HENT: Negative for congestion, dental problem, ear pain, nosebleeds, postnasal drip, rhinorrhea, sinus pressure, sneezing, sore throat and trouble swallowing.   Eyes: Negative for redness and itching.  Respiratory: Negative for cough, chest tightness, shortness of breath and wheezing.   Cardiovascular: Negative for palpitations and leg swelling.  Gastrointestinal: Negative for nausea and vomiting.  Genitourinary: Negative for dysuria.  Musculoskeletal: Negative for joint swelling.  Skin: Negative for rash.  Neurological: Negative for headaches.  Hematological: Does not bruise/bleed easily.  Psychiatric/Behavioral: Negative for dysphoric mood. The patient is not  nervous/anxious.       Objective:   Physical Exam Vitals:   04/30/18 1352  BP: 102/66  Pulse: 66  Temp: 98 F (36.7 C)  TempSrc: Oral  SpO2: 98%  Weight: 132 lb (59.9 kg)  Height: 5\' 3"  (1.6 m)   Gen: Pleasant, kyphotic elderly woman, in no distress,  normal affect  ENT: No lesions,  mouth clear,  oropharynx clear, no postnasal drip  Neck: No JVD, no stridor  Lungs: No use of accessory muscles, soft exp wheeze R> L mid lung   Cardiovascular: RRR, heart sounds normal, no murmur or gallops, no peripheral edema  Musculoskeletal: No deformities, no cyanosis or clubbing  Neuro: alert, non focal  Skin: Warm, no lesions or rashes       Assessment & Plan:  Bronchiectasis with acute exacerbation (HCC) Patient has bronchiectasis in the setting of sarcoidosis.  She has symptoms consistent with an acute flare, unclear as to whether this started as a viral URI versus increased congestion associated with seasonal allergies in the beginning of spring.  She is having colored mucus, some mild wheeze on exam.  I will treat her for an acute exacerbation with prednisone and doxycycline, follow-up to ensure improvement at her already scheduled appointment in about a week.  She has albuterol that she can use as needed.  Allergic rhinitis She is on Zyrtec, just added back her fluticasone nasal spray.  I want her to continue this through the spring allergy season.  GERD Continue omeprazole as she is taking it.  SARCOIDOSIS, PULMONARY Without any evidence of significant parenchymal disease or lymphadenopathy on most recent imaging.  Consider  repeat imaging if she does not improve as expected with treatment of this acute flare.  Sherri Apo, MD, PhD 04/30/2018, 2:10 PM [facility] [phone] or if no answer [phone]

## 2018-04-30 NOTE — Assessment & Plan Note (Signed)
She is on Zyrtec, just added back her fluticasone nasal spray.  I want her to continue this through the spring allergy season.

## 2018-04-30 NOTE — Patient Instructions (Signed)
Please continue your omeprazole and Zyrtec as you have been taking them. Agree with restarting your fluticasone nasal spray, 2 sprays each nostril once daily every day during the spring allergy season Keep your albuterol available to use 2 puffs if needed for shortness of breath, chest tightness, wheezing. Please take prednisone as directed until it is completely gone. Please take doxycycline as directed until it is completely gone Follow-up with Dr. [name] as already scheduled.

## 2018-04-30 NOTE — Assessment & Plan Note (Signed)
Continue omeprazole as she is taking it.

## 2018-05-03 ENCOUNTER — Encounter: Payer: Medicare Other | Admitting: Psychology

## 2018-05-08 ENCOUNTER — Other Ambulatory Visit: Payer: Self-pay

## 2018-05-08 ENCOUNTER — Encounter: Payer: Self-pay | Admitting: Emergency Medicine

## 2018-05-08 ENCOUNTER — Ambulatory Visit (INDEPENDENT_AMBULATORY_CARE_PROVIDER_SITE_OTHER): Payer: Medicare Other | Admitting: Emergency Medicine

## 2018-05-08 DIAGNOSIS — D869 Sarcoidosis, unspecified: Secondary | ICD-10-CM | POA: Diagnosis not present

## 2018-05-08 DIAGNOSIS — J301 Allergic rhinitis due to pollen: Secondary | ICD-10-CM

## 2018-05-08 DIAGNOSIS — K219 Gastro-esophageal reflux disease without esophagitis: Secondary | ICD-10-CM

## 2018-05-08 DIAGNOSIS — J479 Bronchiectasis, uncomplicated: Secondary | ICD-10-CM | POA: Diagnosis not present

## 2018-05-08 NOTE — Assessment & Plan Note (Signed)
Please keep albuterol available to use 2 puffs if needed for shortness of breath, chest tightness, wheeze. We did not need to do any sputum cultures or repeat imaging at this time. Keep your flutter valve available to use as needed for difficulty bringing up your mucus, clearing your cough. Follow with Dr [name] in 6 months or sooner if you have any problems

## 2018-05-08 NOTE — Patient Instructions (Addendum)
Please continue loratadine once daily, fluticasone nasal spray as you have been taking it. Please continue omeprazole once daily Please keep albuterol available to use 2 puffs if needed for shortness of breath, chest tightness, wheeze. We did not need to do any sputum cultures or repeat imaging at this time. Keep your flutter valve available to use as needed for difficulty bringing up your mucus, clearing your cough. Follow with Dr [name] in 6 months or sooner if you have any problems

## 2018-05-08 NOTE — Assessment & Plan Note (Signed)
Rapid improvement with treatment for her exacerbation.  I do not think we need to repeat her CT scan of the chest right now.  Her last was in 10/2017.  We can discuss the timing of any repeat imaging at her next visit.

## 2018-05-08 NOTE — Assessment & Plan Note (Signed)
Please continue omeprazole once daily

## 2018-05-08 NOTE — Assessment & Plan Note (Signed)
Please continue loratadine once daily, fluticasone nasal spray as you have been taking it.

## 2018-05-08 NOTE — Progress Notes (Signed)
Subjective:    Patient ID: [name], female    DOB: [birth year], 84 y.o.   MRN: [id number]  HPI 84 yo woman, never smoker, hx sarcoidosis that was dx by Dr Lucia Gaskins in [place] after FOB (? Whether she had biopsies or not), PSVT, GERD. She also had a hx severe PNA with empyema and chest tube (90's). She has DOE and difficulty walking. The decrease in function has come on gradually. Her cardiac stress test was negative as below. PFT show obstruction with a DLCO defect.   PFT 12/06/13 >> severe AFL without BD response.          Acute OV 04/30/2018 --[name] is 6, follows here for sarcoidosis with associated obstructive lung disease and bronchiectasis.  She also has a history of coronary artery disease, GERD, allergic rhinitis with associated cough.  She is on zyrtec, omeprazole.  Her last chest imaging was done 11/09/2017, showed stable bronchiectasis.  She is here today reporting increased nasal congestion and drainage beginning two weeks ago. Then sore throat about 4-5 days ago. Increased dyspnea, cough with yellowish mucous, chest tightness. No known sick contacts, no travel. No fevers. She restarted her nasal steroid about 4 days ago.   ROV 05/08/2018 --follow-up visit for 84 year old woman with sarcoidosis, associated COPD and bronchiectasis.  She also has allergic rhinitis and GERD associated with cough.  I saw her on 3/9 with new symptoms consistent with a flare that included increased cough, purulent sputum, dyspnea.  She received prednisone and doxycycline.  Today she reports that she is much improved. She is still having a small amount of cough, no real sputum. She remains on flonase and claritin. Uses flutter only prn, rarely.   Review of Systems  Constitutional: Negative for fever and unexpected weight change.  HENT: Negative for congestion, dental problem, ear pain, nosebleeds, postnasal drip, rhinorrhea, sinus pressure, sneezing, sore throat and trouble swallowing.   Eyes: Negative for  redness and itching.  Respiratory: Negative for cough, chest tightness, shortness of breath and wheezing.   Cardiovascular: Negative for palpitations and leg swelling.  Gastrointestinal: Negative for nausea and vomiting.  Genitourinary: Negative for dysuria.  Musculoskeletal: Negative for joint swelling.  Skin: Negative for rash.  Neurological: Negative for headaches.  Hematological: Does not bruise/bleed easily.  Psychiatric/Behavioral: Negative for dysphoric mood. The patient is not nervous/anxious.       Objective:   Physical Exam Vitals:   05/08/18 1338  BP: 124/70  Pulse: 64  SpO2: 95%  Weight: 129 lb (58.5 kg)   Gen: Pleasant, kyphotic elderly woman, in no distress,  normal affect  ENT: No lesions,  mouth clear,  oropharynx clear, no postnasal drip  Neck: No JVD, no stridor  Lungs: No use of accessory muscles, soft exp wheeze R> L mid lung   Cardiovascular: RRR, heart sounds normal, no murmur or gallops, no peripheral edema  Musculoskeletal: No deformities, no cyanosis or clubbing  Neuro: alert, non focal  Skin: Warm, no lesions or rashes       Assessment & Plan:  Bronchiectasis without complication (HCC) Please keep albuterol available to use 2 puffs if needed for shortness of breath, chest tightness, wheeze. We did not need to do any sputum cultures or repeat imaging at this time. Keep your flutter valve available to use as needed for difficulty bringing up your mucus, clearing your cough. Follow with Dr [name] in 6 months or sooner if you have any problems  Allergic rhinitis Please continue loratadine once daily,  fluticasone nasal spray as you have been taking it.  GERD Please continue omeprazole once daily  SARCOIDOSIS, PULMONARY Rapid improvement with treatment for her exacerbation.  I do not think we need to repeat her CT scan of the chest right now.  Her last was in 10/2017.  We can discuss the timing of any repeat imaging at her next visit.  Baltazar Apo, MD, PhD 05/08/2018, 1:50 PM [facility] [phone] or if no answer [phone]

## 2018-05-21 ENCOUNTER — Telehealth: Payer: Self-pay | Admitting: Emergency Medicine

## 2018-05-21 NOTE — Telephone Encounter (Signed)
Primary Pulmonologist: Dr. [name] Last office visit and with whom: 05/08/2018 with Dr. [name] What do we see them for (pulmonary problems): Bronchiectasis  Reason for call:  Spoke with pt. States that she is not feeling well. Reports increased chest tightness, coughing, nasal drainage and PND. Cough is producing clear mucus. Denies shortness of breath, wheezing, fever, sick contacts or recent travel. States that she had these same symptoms a few weeks ago when she last saw Dr. [name]. Symptoms returned on Friday, 05/18/2018.  In the last month, have you been in contact with someone who was confirmed or suspected to have Conoravirus / COVID-19?  No Have you traveled internationally or to an area with more than 100 reported cases of Coronavirus / COVID-19?  No  Do you have any of the following symptoms developed in the last 30 days? Fever: No Cough: Yes Shortness of breath: No  When did your symptoms start?  4 days ago  If the patient has a fever, what is the last reading?  (use n/a if patient denies fever)  N/A . IF THE PATIENT STATES THEY DO NOT OWN A THERMOMETER, THEY MUST GO AND PURCHASE ONE When did the fever start?: N/A Have you taken any medication to suppress a fever (ie Ibuprofen, Aleve, Tylenol)?: N/A  Aaron Edelman - please advise. Thanks!

## 2018-05-21 NOTE — Telephone Encounter (Signed)
Sorry to hear the patient is not feeling well.  Sounds as if the patient is having a flare of allergic rhinitis probably due to the high pollen counts right now.  Please ensure the patient has been taking her Zyrtec daily.  Also that she has been taking her Flonase 1 spray each nostril daily.  If patient is not doing nasal saline rinses she should start doing nasal saline rinses twice daily.  Patient can also take chlor tabs at night for management of postnasal drip.  Please start taking chlorpheniramine (aka Chlor tabs) 4 mg tablet (1 to 2 tablets at night) for management of allergies and postnasal drip at night >>> This is an over-the-counter medication >>> This medication is sedating  If patient does not have chlor tabs at home she could substitute for 25 mg of Benadryl.  Just please remind the patient this is sedating, and to take this at night.    If patient is using these interventions and symptoms are still not improving.  You can encourage the patient to be scheduled for a tele-visit with our office to further evaluate.   Wyn Quaker FNP

## 2018-05-21 NOTE — Telephone Encounter (Signed)
Patient is aware and nothing further is needed at this time. 

## 2018-06-06 ENCOUNTER — Telehealth: Payer: Self-pay | Admitting: Internal Medicine

## 2018-06-06 NOTE — Telephone Encounter (Signed)
Pt states she has not had a BM in a week. She is calling wanting to know what she can take. Pt instructed to try magnesium citrate to have BM and then to resume taking her miralax daily. Pt had been taking the miralax and stopped, she knows to resume the miralax.

## 2018-06-12 NOTE — Telephone Encounter (Signed)
Pt is following back up on the previous msg.  °

## 2018-06-12 NOTE — Telephone Encounter (Signed)
Patient reported that she had been dealing with constipation and to alleviate the problem she took dulcolax, ex lax, mag citrate and milk of magnesia. The medications for constipation caused more abd cramping and pain that she reported was there prior to the ingestion of the constipation medications. The patient asked if she could continue to "put Miralax in my coffee" and this RN told the patient to discontinue the use of any laxatives and stool softeners until she talked to Dr. Henrene Pastor tomorrow (06/13/18 at 10:30am) on the phone.

## 2018-06-13 ENCOUNTER — Other Ambulatory Visit: Payer: Self-pay

## 2018-06-13 ENCOUNTER — Ambulatory Visit (INDEPENDENT_AMBULATORY_CARE_PROVIDER_SITE_OTHER): Payer: Medicare Other | Admitting: Internal Medicine

## 2018-06-13 ENCOUNTER — Encounter: Payer: Self-pay | Admitting: Internal Medicine

## 2018-06-13 VITALS — Ht 63.0 in | Wt 129.0 lb

## 2018-06-13 DIAGNOSIS — R109 Unspecified abdominal pain: Secondary | ICD-10-CM

## 2018-06-13 DIAGNOSIS — K5900 Constipation, unspecified: Secondary | ICD-10-CM

## 2018-06-13 NOTE — Progress Notes (Signed)
HISTORY OF PRESENT ILLNESS:  [name] is a 84 y.o. female who schedules this telemedicine visit during the coronavirus pandemic with chief complaint of significant constipation associated with abdominal discomfort.  Patient was last seen in this office November 2018 regarding the same.  See that dictation for details.  Her last colonoscopy was performed in 2015 and revealed a diminutive colon polyp but was otherwise normal.  No further follow-up recommended.  Patient tells me that she was doing well until about 2 weeks ago when she decided to stop her MiraLAX as this was making her bowels soft.  No frank diarrhea or incontinence.  She then became constipated and noticed significant discomfort across the lower abdomen.  No fevers.  She is tried multiple agents since with unsatisfactory results.  Laxatives resulted in some cramping.  She also denies nausea, vomiting, or rectal bleeding.  Her weight has been stable.  Review of outside laboratories from her primary care office dated January 31, 2018 finds normal comprehensive metabolic panel and unremarkable CBC with hemoglobin 13.8.  Review of x-ray file shows no relevant x-ray abnormalities.  Patient had multiple questions regarding Linzess and was quite interested in starting this medication if it may help her constipation.  REVIEW OF SYSTEMS:  All non-GI ROS negative as otherwise stated in the HPI except for asthma  Past Medical History:  Diagnosis Date  . Allergy    SEASONAL  . Arthritis   . Asthma    "slight"   . Baker's cyst of knee, right   . CAD (coronary artery disease)    a. Myoview 10/15 - normal EF 70% // Myoview 11/16: EF 75%, normal perfusion, (there was no blood pressure drop at low level exercise with Lexiscan infusion), low risk study // c. LHC 3/17 - LAD irregs, oLCx 70 (neg FFR), mRCA 30, EF 55-65% >> med Rx  . Carotid stenosis    a. Carotid US 9/15 - Bilateral 1-39% ICA >> FU 2 years // b. Bilateral ICA 1-39 >> FU prn  .  Dyspnea    due to Sarcoidosis  . Elbow fracture 04/2017   Right, had surgery  . Esophageal reflux   . Hematochezia   . History of echocardiogram    a. Echo 10/15 - EF 60-65%, no RWMA  . HLD (hyperlipidemia)   . Hx of colonoscopy   . Osteoporosis   . Other chronic pulmonary heart diseases   . Paroxysmal supraventricular tachycardia ([place])   . Pneumonia   . Sarcoidosis     Past Surgical History:  Procedure Laterality Date  . arm surgery Right   . BLADDER SURGERY    . CARDIAC CATHETERIZATION N/A 05/20/2015   Procedure: Left Heart Cath and Coronary Angiography;  Surgeon: Sherren Mocha, MD;  Location: [facility];  Service: Cardiovascular;  Laterality: N/A;  . CARPAL TUNNEL RELEASE Left   . COLONOSCOPY    . FOOT SURGERY Right   . HIP SURGERY Right 2011   full replacement  . LEG SURGERY Left May 2014   femur fracture s/p open and closed reduction in [place], Dr. Jimmye Norman  . ORIF ELBOW FRACTURE Right 05/09/2017   Procedure: ORIF right olecranon fracture with repair/reconstruction, ulnar nerve transposition as needed;  Surgeon: Roseanne Kaufman, MD;  Location: [facility];  Service: Orthopedics;  Laterality: Right;  Requests 90 mins  . POLYPECTOMY    . SHOULDER ARTHROSCOPY W/ ROTATOR CUFF REPAIR Right   . TRAPEZIUM RESECTION Right     Social History Sherri Widmann  Mcgrath  reports that she has never smoked. She has never used smokeless tobacco. She reports that she does not drink alcohol or use drugs.  family history includes Arrhythmia in her father; Cancer in her father, mother, and sister; Colon cancer (age of onset: 68) in her mother; Heart attack in her brother; Heart disease in her father; Lung cancer in her father.  Allergies  Allergen Reactions  . Levaquin [Levofloxacin] Other (See Comments)    Leg pain .Marland Kitchen Per doctor not to take again  . Sulfonamide Derivatives     Pt unsure  . Morphine Nausea Only  . Oxycodone-Acetaminophen Other (See Comments)    "talking out of my head"   . Percocet [Oxycodone-Acetaminophen] Nausea And Vomiting       PHYSICAL EXAMINATION: Vital signs: Ht 5\' 3"  (1.6 m)   Wt 129 lb (58.5 kg)   LMP 02/22/1983 (Approximate)   BMI 22.85 kg/m   Constitutional:  no acute distress Psychiatric: alert and oriented x3, cooperative No additional exam information as this is a telemedicine visit   ASSESSMENT:  1.  Functional constipation.  Deteriorated after discontinuing MiraLAX therapy 2.  Abdominal discomfort secondary to constipation 3.  Colonoscopy July 2015 essentially normal   PLAN:  1.  PRESCRIBE Linzess 145 mcg; 1 by mouth daily; #30; 3 refills 2.  If the patient decides not to take this prescription or if it is not helpful then we discussed in great detail the proper way to titrate MiraLAX to achieve the desired result.  I believe she understood 3.  Resume general medical care with PCP.  However, contact this office if needed for any questions or problems.  She agrees.  This telemedicine visit via telephone (Medicare patient) was initiated by the patient who also consented to the visit.  Patient was in her home while I was in my office during the evaluation.  She understands her may be associated professional charges for this service

## 2018-06-14 MED ORDER — LINACLOTIDE 145 MCG PO CAPS: 145.0000 ug | ORAL_CAPSULE | Freq: Every day | ORAL | 3 refills | Status: DC

## 2018-06-14 NOTE — Addendum Note (Signed)
Addended by: Wyline Beady on: 06/14/2018 08:22 AM   Modules accepted: Orders

## 2018-07-09 ENCOUNTER — Ambulatory Visit (HOSPITAL_COMMUNITY)
Admission: RE | Admit: 2018-07-09 | Discharge: 2018-07-09 | Disposition: A | Discharge status: Home / Self Care | Payer: Medicare Other | Source: Ambulatory Visit | Attending: Cardiology | Admitting: Cardiology

## 2018-07-09 ENCOUNTER — Other Ambulatory Visit: Payer: Self-pay

## 2018-07-09 DIAGNOSIS — I6523 Occlusion and stenosis of bilateral carotid arteries: Secondary | ICD-10-CM | POA: Insufficient documentation

## 2018-07-10 ENCOUNTER — Encounter: Payer: Self-pay | Admitting: Physician Assistant

## 2018-07-13 ENCOUNTER — Ambulatory Visit (HOSPITAL_COMMUNITY)
Admission: RE | Admit: 2018-07-13 | Discharge: 2018-07-13 | Disposition: A | Discharge status: Home / Self Care | Payer: Medicare Other | Source: Ambulatory Visit | Attending: Internal Medicine | Admitting: Internal Medicine

## 2018-07-13 ENCOUNTER — Ambulatory Visit (HOSPITAL_COMMUNITY): Payer: Medicare Other

## 2018-07-13 ENCOUNTER — Encounter (HOSPITAL_COMMUNITY): Payer: Medicare Other

## 2018-07-13 ENCOUNTER — Telehealth: Payer: Self-pay | Admitting: *Deleted

## 2018-07-13 ENCOUNTER — Other Ambulatory Visit: Payer: Self-pay

## 2018-07-13 DIAGNOSIS — M810 Age-related osteoporosis without current pathological fracture: Secondary | ICD-10-CM | POA: Diagnosis not present

## 2018-07-13 MED ORDER — DENOSUMAB 60 MG/ML ~~LOC~~ SOSY
60.0000 mg | PREFILLED_SYRINGE | Freq: Once | SUBCUTANEOUS | Status: AC
  Administered 2018-07-13: 60 mg via SUBCUTANEOUS
  Filled 2018-07-13: qty 1

## 2018-07-13 NOTE — Telephone Encounter (Signed)
SPOKE WITH PT AND PT CONSENT T O A TELEPHONE CALL HER PHONE JUST A REG PHONE .  PT METIONED HSE HAS A PROCEDURE EARLIER UP IN THE DAY BUT ITS MINOR AND SHE WILL DO VISIT BUT IF SHE CANT SHELL RESCHEDULE  Confirm consent - "In the setting of the current Covid19 crisis, you are scheduled for a (phone or video) visit with your provider on (date) at (time).  Just as we do with many in-office visits, in order for you to participate in this visit, we must obtain consent.  If you'd like, I can send this to your mychart (if signed up) or email for you to review.  Otherwise, I can obtain your verbal consent now.  All virtual visits are billed to your insurance company just like a normal visit would be.  By agreeing to a virtual visit, we'd like you to understand that the technology does not allow for your provider to perform an examination, and thus may limit your provider's ability to fully assess your condition. If your provider identifies any concerns that need to be evaluated in person, we will make arrangements to do so.  Finally, though the technology is pretty good, we cannot assure that it will always work on either your or our end, and in the setting of a video visit, we may have to convert it to a phone-only visit.  In either situation, we cannot ensure that we have a secure connection.  Are you willing to proceed?" STAFF: Did  1. the patient verbally acknowledge consent to telehealth visit? Document YES/NO here: YES  2.  TELEPHONE CALL NOTE  [name] has been deemed a candidate for a follow-up tele-health visit to limit community exposure during the Covid-19 pandemic. I spoke with the patient via phone to ensure availability of phone/video source, confirm preferred email & phone number, and discuss instructions and expectations.  I reminded [name] to be prepared with any vital sign and/or heart rhythm information that could potentially be obtained via home monitoring, at the time of  her visit. I reminded [name] to expect a phone call prior to her visit.  Claude Manges, [place] 07/13/2018 2:31 PM   FULL LENGTH CONSENT FOR TELE-HEALTH VISIT   I hereby voluntarily request, consent and authorize [facility] and its employed or contracted physicians, physician assistants, nurse practitioners or other licensed health care professionals (the Practitioner), to provide me with telemedicine health care services (the Services") as deemed necessary by the treating Practitioner. I acknowledge and consent to receive the Services by the Practitioner via telemedicine. I understand that the telemedicine visit will involve communicating with the Practitioner through live audiovisual communication technology and the disclosure of certain medical information by electronic transmission. I acknowledge that I have been given the opportunity to request an in-person assessment or other available alternative prior to the telemedicine visit and am voluntarily participating in the telemedicine visit.  I understand that I have the right to withhold or withdraw my consent to the use of telemedicine in the course of my care at any time, without affecting my right to future care or treatment, and that the Practitioner or I may terminate the telemedicine visit at any time. I understand that I have the right to inspect all information obtained and/or recorded in the course of the telemedicine visit and may receive copies of available information for a reasonable fee.  I understand that some of the potential risks of receiving the Services via  telemedicine include:   Delay or interruption in medical evaluation due to technological equipment failure or disruption;  Information transmitted may not be sufficient (e.g. poor resolution of images) to allow for appropriate medical decision making by the Practitioner; and/or   In rare instances, security protocols could fail, causing a breach of personal  health information.  Furthermore, I acknowledge that it is my responsibility to provide information about my medical history, conditions and care that is complete and accurate to the best of my ability. I acknowledge that Practitioner's advice, recommendations, and/or decision may be based on factors not within their control, such as incomplete or inaccurate data provided by me or distortions of diagnostic images or specimens that may result from electronic transmissions. I understand that the practice of medicine is not an exact science and that Practitioner makes no warranties or guarantees regarding treatment outcomes. I acknowledge that I will receive a copy of this consent concurrently upon execution via email to the email address I last provided but may also request a printed copy by calling the office of [name].    I understand that my insurance will be billed for this visit.   I have read or had this consent read to me.  I understand the contents of this consent, which adequately explains the benefits and risks of the Services being provided via telemedicine.   I have been provided ample opportunity to ask questions regarding this consent and the Services and have had my questions answered to my satisfaction.  I give my informed consent for the services to be provided through the use of telemedicine in my medical care  By participating in this telemedicine visit I agree to the above.

## 2018-07-13 NOTE — Discharge Instructions (Signed)
Denosumab injection °What is this medicine? °DENOSUMAB (den oh sue mab) slows bone breakdown. Prolia is used to treat osteoporosis in women after menopause and in men, and in people who are taking corticosteroids for 6 months or more. Xgeva is used to treat a high calcium level due to cancer and to prevent bone fractures and other bone problems caused by multiple myeloma or cancer bone metastases. Xgeva is also used to treat giant cell tumor of the bone. °This medicine may be used for other purposes; ask your health care provider or pharmacist if you have questions. °COMMON BRAND NAME(S): Prolia, XGEVA °What should I tell my health care provider before I take this medicine? °They need to know if you have any of these conditions: °-dental disease °-having surgery or tooth extraction °-infection °-kidney disease °-low levels of calcium or Vitamin D in the blood °-malnutrition °-on hemodialysis °-skin conditions or sensitivity °-thyroid or parathyroid disease °-an unusual reaction to denosumab, other medicines, foods, dyes, or preservatives °-pregnant or trying to get pregnant °-breast-feeding °How should I use this medicine? °This medicine is for injection under the skin. It is given by a health care professional in a hospital or clinic setting. °A special MedGuide will be given to you before each treatment. Be sure to read this information carefully each time. °For Prolia, talk to your pediatrician regarding the use of this medicine in children. Special care may be needed. For Xgeva, talk to your pediatrician regarding the use of this medicine in children. While this drug may be prescribed for children as young as 13 years for selected conditions, precautions do apply. °Overdosage: If you think you have taken too much of this medicine contact a poison control center or emergency room at once. °NOTE: This medicine is only for you. Do not share this medicine with others. °What if I miss a dose? °It is important not to  miss your dose. Call your doctor or health care professional if you are unable to keep an appointment. °What may interact with this medicine? °Do not take this medicine with any of the following medications: °-other medicines containing denosumab °This medicine may also interact with the following medications: °-medicines that lower your chance of fighting infection °-steroid medicines like prednisone or cortisone °This list may not describe all possible interactions. Give your health care provider a list of all the medicines, herbs, non-prescription drugs, or dietary supplements you use. Also tell them if you smoke, drink alcohol, or use illegal drugs. Some items may interact with your medicine. °What should I watch for while using this medicine? °Visit your doctor or health care professional for regular checks on your progress. Your doctor or health care professional may order blood tests and other tests to see how you are doing. °Call your doctor or health care professional for advice if you get a fever, chills or sore throat, or other symptoms of a cold or flu. Do not treat yourself. This drug may decrease your body's ability to fight infection. Try to avoid being around people who are sick. °You should make sure you get enough calcium and vitamin D while you are taking this medicine, unless your doctor tells you not to. Discuss the foods you eat and the vitamins you take with your health care professional. °See your dentist regularly. Brush and floss your teeth as directed. Before you have any dental work done, tell your dentist you are receiving this medicine. °Do not become pregnant while taking this medicine or for 5 months   after stopping it. Talk with your doctor or health care professional about your birth control options while taking this medicine. Women should inform their doctor if they wish to become pregnant or think they might be pregnant. There is a potential for serious side effects to an unborn  child. Talk to your health care professional or pharmacist for more information. °What side effects may I notice from receiving this medicine? °Side effects that you should report to your doctor or health care professional as soon as possible: °-allergic reactions like skin rash, itching or hives, swelling of the face, lips, or tongue °-bone pain °-breathing problems °-dizziness °-jaw pain, especially after dental work °-redness, blistering, peeling of the skin °-signs and symptoms of infection like fever or chills; cough; sore throat; pain or trouble passing urine °-signs of low calcium like fast heartbeat, muscle cramps or muscle pain; pain, tingling, numbness in the hands or feet; seizures °-unusual bleeding or bruising °-unusually weak or tired °Side effects that usually do not require medical attention (report to your doctor or health care professional if they continue or are bothersome): °-constipation °-diarrhea °-headache °-joint pain °-loss of appetite °-muscle pain °-runny nose °-tiredness °-upset stomach °This list may not describe all possible side effects. Call your doctor for medical advice about side effects. You may report side effects to FDA at 1-800-FDA-1088. °Where should I keep my medicine? °This medicine is only given in a clinic, doctor's office, or other health care setting and will not be stored at home. °NOTE: This sheet is a summary. It may not cover all possible information. If you have questions about this medicine, talk to your doctor, pharmacist, or health care provider. °© 2019 [employer]vier/Gold Standard (2017-06-16 16:10:44) ° °

## 2018-07-16 NOTE — Progress Notes (Signed)
Virtual Visit via Telephone Note   This visit type was conducted due to national recommendations for restrictions regarding the COVID-19 Pandemic (e.g. social distancing) in an effort to limit this patient's exposure and mitigate transmission in our community.  Due to her co-morbid illnesses, this patient is at least at moderate risk for complications without adequate follow up.  This format is felt to be most appropriate for this patient at this time.  The patient did not have access to video technology/had technical difficulties with video requiring transitioning to audio format only (telephone).  All issues noted in this document were discussed and addressed.  No physical exam could be performed with this format.  Please refer to the patient's chart for her  consent to telehealth for [facility].   Date:  07/17/2018   ID:  [name], DOB 11-27-1934, MRN [id number]  Patient Location: Home Provider Location: Home  PCP:  [name], MD  Cardiologist:  Sherren Mocha, MD   Electrophysiologist:  None  Gastroenterologist:  Dr. Henrene Pastor  Evaluation Performed:  Follow-Up Visit  Chief Complaint:  Chest Pain  History of Present Illness:    [name] is a 84 y.o. female with CAD, pulmonary sarcoidosis, HTN, HL, SVT. Echo in 10/15 demonstrated normal LVF. Myoview in 10/15 demonstrated no ischemia. She had a high res CT in 10/16 with coronary calcification. Myoview in 11/16 was neg for ischemia. She then had symptoms of bilateral arm pain concerning for angina and LHC in 3/17 demonstrated ostial LCx stenosis of 60-70%that was not hemodynamically significant by FFR. She was last seen in May 2020.  Today, she notes that she has been having indigestion for the past 2 months.  She was previously taking omeprazole once daily.  She increased it to twice daily with minimal improvement.  She thinks Tums may help sometimes.  She has not tried nitroglycerin.  She sometimes has symptoms  with lying supine.  She has not really had any exertional symptoms but she has had some radiation to her jaw at times.  She has not had any associated diaphoresis or nausea.  She has not had orthopnea, paroxysmal nocturnal dyspnea or lower extremity swelling.  The patient does not have symptoms concerning for COVID-19 infection (fever, chills, cough, or new shortness of breath).    Past Medical History:  Diagnosis Date  . Allergy    SEASONAL  . Arthritis   . Asthma    "slight"   . Baker's cyst of knee, right   . CAD (coronary artery disease)    a. Myoview 10/15 - normal EF 70% // Myoview 11/16: EF 75%, normal perfusion, (there was no blood pressure drop at low level exercise with Lexiscan infusion), low risk study // c. LHC 3/17 - LAD irregs, oLCx 70 (neg FFR), mRCA 30, EF 55-65% >> med Rx  . Carotid stenosis    a. Carotid US 9/15 - Bilateral 1-39% ICA >> FU 2 years // b. Bilateral ICA 1-39 >> FU prn // Carotid US 06/2018: bilat 1-39; fu prn  . Dyspnea    due to Sarcoidosis  . Elbow fracture 04/2017   Right, had surgery  . Esophageal reflux   . Hematochezia   . History of echocardiogram    a. Echo 10/15 - EF 60-65%, no RWMA  . HLD (hyperlipidemia)   . Hx of colonoscopy   . Osteoporosis   . Other chronic pulmonary heart diseases   . Paroxysmal supraventricular tachycardia ([place])   . Pneumonia   .  Sarcoidosis    Past Surgical History:  Procedure Laterality Date  . arm surgery Right   . BLADDER SURGERY    . CARDIAC CATHETERIZATION N/A 05/20/2015   Procedure: Left Heart Cath and Coronary Angiography;  Surgeon: Sherren Mocha, MD;  Location: [place] [facility];  Service: Cardiovascular;  Laterality: N/A;  . CARPAL TUNNEL RELEASE Left   . COLONOSCOPY    . FOOT SURGERY Right   . HIP SURGERY Right 2011   full replacement  . LEG SURGERY Left May 2014   femur fracture s/p open and closed reduction in [place], Dr. Jimmye Norman  . ORIF ELBOW FRACTURE Right 05/09/2017   Procedure: ORIF  right olecranon fracture with repair/reconstruction, ulnar nerve transposition as needed;  Surgeon: Roseanne Kaufman, MD;  Location: [place];  Service: Orthopedics;  Laterality: Right;  Requests 90 mins  . POLYPECTOMY    . SHOULDER ARTHROSCOPY W/ ROTATOR CUFF REPAIR Right   . TRAPEZIUM RESECTION Right      Current Meds  Medication Sig  . acetaminophen (TYLENOL) 500 MG tablet Take 1,000 mg by mouth every 6 (six) hours as needed.  Marland Kitchen albuterol (PROVENTIL HFA;VENTOLIN HFA) 108 (90 Base) MCG/ACT inhaler Inhale 2 puffs every 4 (four) hours as needed into the lungs for wheezing or shortness of breath.  Marland Kitchen aspirin EC 81 MG tablet Take 1 tablet (81 mg total) by mouth daily.  . calcium carbonate (TUMS - DOSED IN MG ELEMENTAL CALCIUM) 500 MG chewable tablet Chew 1 tablet by mouth daily. Pt isn't sure of dose.  Marland Kitchen CARTIA XT 180 MG 24 hr capsule TAKE 1 CAPSULE(180 MG) BY MOUTH DAILY  . cetirizine (ZYRTEC) 10 MG tablet Take 10 mg by mouth daily.  Marland Kitchen denosumab (PROLIA) 60 MG/ML SOLN injection Inject 60 mg into the skin every 6 (six) months. Administer in upper arm, thigh, or abdomen  . fluticasone (FLONASE) 50 MCG/ACT nasal spray Place into both nostrils as needed for allergies or rhinitis.  Marland Kitchen linaclotide (LINZESS) 145 MCG CAPS capsule Take 1 capsule (145 mcg total) by mouth daily before breakfast.  . moxifloxacin (VIGAMOX) 0.5 % ophthalmic solution INSTILL 1 DROP INTO OS QID STARTING 2 DAYS PRIOR TO SURGERY AND 2 DROPS THE MORNING OF SURGERY  . nitrofurantoin (MACRODANTIN) 100 MG capsule Take 100 mg by mouth 2 (two) times daily.  . nitroGLYCERIN (NITROSTAT) 0.4 MG SL tablet Place 1 tablet (0.4 mg total) under the tongue every 5 (five) minutes as needed for chest pain.  Marland Kitchen ofloxacin (OCUFLOX) 0.3 % ophthalmic solution ofloxacin 0.3 % eye drops  . omeprazole (PRILOSEC) 40 MG capsule Take 40 mg by mouth daily.   . prednisoLONE acetate (PRED FORTE) 1 % ophthalmic suspension Use as directed  . PROLENSA 0.07 % SOLN Use  as directed  . traMADol (ULTRAM) 50 MG tablet Take 50 mg by mouth every 6 (six) hours as needed for severe pain.   . Vitamin D, Ergocalciferol, (DRISDOL) 50000 units CAPS capsule Take 50,000 Units by mouth every 7 (seven) days.     Allergies:   Levaquin [levofloxacin]; Sulfonamide derivatives; Morphine; Oxycodone-acetaminophen; and Percocet [oxycodone-acetaminophen]   Social History   Tobacco Use  . Smoking status: Never Smoker  . Smokeless tobacco: Never Used  Substance Use Topics  . Alcohol use: No  . Drug use: No     Family Hx: The patient's family history includes Arrhythmia in her father; Cancer in her father, mother, and sister; Colon cancer (age of onset: 86) in her mother; Heart attack in her brother;  Heart disease in her father; Lung cancer in her father. There is no history of Esophageal cancer, Rectal cancer, Stomach cancer, or Stroke.  ROS:   Please see the history of present illness.     All other systems reviewed and are negative.   Prior CV studies:   The following studies were reviewed today:  Carotid US 07/09/2018 Bilat ICA 1-39  Carotid US 10/17 bilat ICA 1-39 >> FU prn   LHC 05/20/15 LAD plaque - nonobstructive LCx ost 70% >> FFR 0.99 > 0.95 (not hemodynamically significant); mid 40% RCA mid 30% EF 55-65% 1. Moderate single-vessel CAD involving the ostium of the LCx. Interrogated with FFR which is negative. 2. Widely patent LAD and RCA. 3. Normal LV function. Recommendations: Medical therapy for nonobstructive CAD.   Myoview 11/16 EF 75%, no ischemia, low risk   High Resolution CT 12/02/14 IMPRESSION: 1. No findings to suggest interstitial lung disease. 2. The overall appearance the chest is very similar to the prior study 11/28/2012. Findings are nonspecific, but could be seen in the setting of reported sarcoidosis, as detailed above. 3. Areas of bronchiectasis are noted in the right lung, most severe in the medial segment of the right middle lobe  where there are areas of cystic bronchiectasis and architectural distortion. 4. Atherosclerosis, including left main and 3 vessel coronary artery disease. Assessment for potential risk factor modification, dietary therapy or pharmacologic therapy may be warranted, if clinically indicated. 5. Additional incidental findings, as above.   Myoview 10/15 Normal stress nuclear study.  LV Ejection Fraction: 70%.   Echo 10/15 EF 60-65%, no RWMA   Carotid US 9/15 Bilateral 1-39% ICA >> FU 2 years     Labs/Other Tests and Data Reviewed:    EKG:  No ECG reviewed.  Recent Labs: 11/02/2017: BUN 22; Creatinine, Ser 0.68; Potassium 4.3; Sodium 140 11/27/2017: ALT 12   Recent Lipid Panel Lab Results  Component Value Date/Time   CHOL 149 11/27/2017 08:00 AM   TRIG 67 11/27/2017 08:00 AM   HDL 73 11/27/2017 08:00 AM   CHOLHDL 2.0 11/27/2017 08:00 AM   CHOLHDL 2.0 11/03/2015 07:42 AM   LDLCALC 63 11/27/2017 08:00 AM    Wt Readings from Last 3 Encounters:  07/17/18 125 lb (56.7 kg)  06/13/18 129 lb (58.5 kg)  05/08/18 129 lb (58.5 kg)     Objective:    Vital Signs:  Temp (!) 96.9 F (36.1 C)   Ht 5\' 3"  (1.6 m)   Wt 125 lb (56.7 kg)   LMP 02/22/1983 (Approximate)   BMI 22.14 kg/m    VITAL SIGNS:  reviewed GEN:  no acute distress RESPIRATORY:  no labored breathing NEURO:  alert and oriented PSYCH:  she is in good spirits.  ASSESSMENT & PLAN:    Chest pain, unspecified type Her symptoms sound more c/w with GERD than angina.  However, she had a 70% ostial LCx lesion at Cardiac Catheterization in 2017.  This was not hemodynamically significant by FFR.  She has had some radiation to her jaw.  I recommend proceeding with a Lexiscan Myoview to rule out the possibility of ischemia in the LCx territory.  I have recommended that she take Omeprazole twice daily and call her gastroenterologist for earlier follow up.    -Arrange Lexiscan Myoview  Coronary artery disease involving native  coronary artery of native heart without angina pectoris Moderate nonobstructive coronary disease by cardiac catheterization March 2017.  She has had recent chest pain that sounds c/w GERD.  As noted I will get a Myoview to rule out ischemia.  Continue ASA, statin.  Bilateral carotid artery stenosis Minimal plaque by recent US.  FU as needed.  SARCOIDOSIS, PULMONARY FU with Pulmo as planned.  Hyperlipidemia, unspecified hyperlipidemia type LDL optimal on most recent lab work.  Continue current Rx.    COVID-19 Education: The signs and symptoms of COVID-19 were discussed with the patient and how to seek care for testing (follow up with PCP or arrange E-visit).  The importance of social distancing was discussed today.  Time:   Today, I have spent 19 minutes with the patient with telehealth technology discussing the above problems.     Medication Adjustments/Labs and Tests Ordered: Current medicines are reviewed at length with the patient today.  Concerns regarding medicines are outlined above.   Tests Ordered: Orders Placed This Encounter  Procedures  . MYOCARDIAL PERFUSION IMAGING    Medication Changes: No orders of the defined types were placed in this encounter.   Disposition:  Follow up in 6 month(s)  Signed, [name], PA-C  07/17/2018 4:25 PM    [facility]

## 2018-07-17 ENCOUNTER — Other Ambulatory Visit: Payer: Self-pay

## 2018-07-17 ENCOUNTER — Telehealth: Payer: Self-pay | Admitting: Licensed Clinical Social Worker

## 2018-07-17 ENCOUNTER — Telehealth (INDEPENDENT_AMBULATORY_CARE_PROVIDER_SITE_OTHER): Payer: Medicare Other | Admitting: Physician Assistant

## 2018-07-17 ENCOUNTER — Encounter: Payer: Self-pay | Admitting: Physician Assistant

## 2018-07-17 VITALS — Temp 96.9°F | Ht 63.0 in | Wt 125.0 lb

## 2018-07-17 DIAGNOSIS — I2510 Atherosclerotic heart disease of native coronary artery without angina pectoris: Secondary | ICD-10-CM

## 2018-07-17 DIAGNOSIS — E785 Hyperlipidemia, unspecified: Secondary | ICD-10-CM

## 2018-07-17 DIAGNOSIS — Z7189 Other specified counseling: Secondary | ICD-10-CM

## 2018-07-17 DIAGNOSIS — D869 Sarcoidosis, unspecified: Secondary | ICD-10-CM

## 2018-07-17 DIAGNOSIS — R079 Chest pain, unspecified: Secondary | ICD-10-CM

## 2018-07-17 DIAGNOSIS — I6523 Occlusion and stenosis of bilateral carotid arteries: Secondary | ICD-10-CM

## 2018-07-17 NOTE — Patient Instructions (Signed)
Medication Instructions:  Continue current medications.  If you need a refill on your cardiac medications before your next appointment, please call your pharmacy.   Lab work: None   If you have labs (blood work) drawn today and your tests are completely normal, you will receive your results only by: Marland Kitchen MyChart Message (if you have MyChart) OR . A paper copy in the mail If you have any lab test that is abnormal or we need to change your treatment, we will call you to review the results.  Testing/Procedures: Our office will arrange a stress test (Lexiscan Myoview)  Follow-Up: At [facility], you and your health needs are our priority.  As part of our continuing mission to provide you with exceptional heart care, we have created designated Provider Care Teams.  These Care Teams include your primary Cardiologist (physician) and Advanced Practice Providers (APPs -  Physician Assistants and Nurse Practitioners) who all work together to provide you with the care you need, when you need it. You will need a follow up appointment in:  6 months.  Please call our office 2 months in advance to schedule this appointment.  You may see Sherren Mocha, MD or [name], PA-C   Any Other Special Instructions Will Be Listed Below (If Applicable).  Please arrange a follow up with Dr. Henrene Pastor for your acid reflux.  If you stress test is normal, you will need further management of your acid reflux with Dr. Henrene Pastor.

## 2018-07-17 NOTE — Telephone Encounter (Signed)
CSW referred to assist patient with obtaining a BP cuff. CSW contacted patient to inform cuff will be delivered to home next week. Patient grateful for support and assistance. CSW available as needed. Jackie Janziel Hockett, LCSW, CCSW-MCS [phone]  

## 2018-07-18 ENCOUNTER — Other Ambulatory Visit (HOSPITAL_COMMUNITY): Payer: Self-pay

## 2018-07-23 ENCOUNTER — Encounter: Payer: Self-pay | Admitting: Physician Assistant

## 2018-07-30 ENCOUNTER — Telehealth (HOSPITAL_COMMUNITY): Payer: Self-pay | Admitting: *Deleted

## 2018-07-30 NOTE — Telephone Encounter (Signed)
Patient given detailed instructions per Myocardial Perfusion Study Information Sheet for the test on 08/02/18. Patient notified to arrive 15 minutes early and that it is imperative to arrive on time for appointment to keep from having the test rescheduled.  If you need to cancel or reschedule your appointment, please call the office within 24 hours of your appointment. . Patient verbalized understanding. [name]

## 2018-08-02 ENCOUNTER — Encounter (HOSPITAL_COMMUNITY): Payer: Self-pay

## 2018-08-02 ENCOUNTER — Ambulatory Visit (HOSPITAL_COMMUNITY): Payer: Medicare Other | Attending: Physician Assistant

## 2018-08-02 ENCOUNTER — Other Ambulatory Visit: Payer: Self-pay

## 2018-08-02 DIAGNOSIS — I2510 Atherosclerotic heart disease of native coronary artery without angina pectoris: Secondary | ICD-10-CM | POA: Diagnosis not present

## 2018-08-02 DIAGNOSIS — R079 Chest pain, unspecified: Secondary | ICD-10-CM | POA: Insufficient documentation

## 2018-08-02 LAB — MYOCARDIAL PERFUSION IMAGING
LV dias vol: 57 mL (ref 46–106)
LV sys vol: 15 mL
Peak HR: 79 {beats}/min
Rest HR: 54 {beats}/min
SDS: 1
SRS: 0
SSS: 1
TID: 1.09

## 2018-08-02 MED ORDER — REGADENOSON 0.4 MG/5ML IV SOLN
0.4000 mg | Freq: Once | INTRAVENOUS | Status: AC
  Administered 2018-08-02: 0.4 mg via INTRAVENOUS

## 2018-08-02 MED ORDER — TECHNETIUM TC 99M TETROFOSMIN IV KIT
10.4000 | PACK | Freq: Once | INTRAVENOUS | Status: AC | PRN
  Administered 2018-08-02: 10.4 via INTRAVENOUS
  Filled 2018-08-02: qty 11

## 2018-08-02 MED ORDER — TECHNETIUM TC 99M TETROFOSMIN IV KIT
32.6000 | PACK | Freq: Once | INTRAVENOUS | Status: AC | PRN
  Administered 2018-08-02: 32.6 via INTRAVENOUS
  Filled 2018-08-02: qty 33

## 2018-08-07 ENCOUNTER — Encounter: Payer: Self-pay | Admitting: Physician Assistant

## 2018-08-25 ENCOUNTER — Other Ambulatory Visit: Payer: Self-pay | Admitting: Physician Assistant

## 2018-11-07 ENCOUNTER — Other Ambulatory Visit: Payer: Self-pay

## 2018-11-07 ENCOUNTER — Ambulatory Visit (INDEPENDENT_AMBULATORY_CARE_PROVIDER_SITE_OTHER): Payer: Medicare Other | Admitting: Emergency Medicine

## 2018-11-07 ENCOUNTER — Encounter: Payer: Self-pay | Admitting: Emergency Medicine

## 2018-11-07 DIAGNOSIS — J479 Bronchiectasis, uncomplicated: Secondary | ICD-10-CM | POA: Diagnosis not present

## 2018-11-07 DIAGNOSIS — K219 Gastro-esophageal reflux disease without esophagitis: Secondary | ICD-10-CM | POA: Diagnosis not present

## 2018-11-07 DIAGNOSIS — J301 Allergic rhinitis due to pollen: Secondary | ICD-10-CM

## 2018-11-07 DIAGNOSIS — I6523 Occlusion and stenosis of bilateral carotid arteries: Secondary | ICD-10-CM

## 2018-11-07 MED ORDER — IPRATROPIUM BROMIDE 0.03 % NA SOLN: 2.0000 | Freq: Four times a day (QID) | NASAL | 11 refills | Status: DC | PRN

## 2018-11-07 NOTE — Progress Notes (Signed)
Subjective:    Patient ID: [name], female    DOB: [birth year], 84 y.o.   MRN: [id number]  HPI 84 yo woman, never smoker, hx sarcoidosis that was dx by Dr Lucia Gaskins in [place] after FOB (? Whether she had biopsies or not), PSVT, GERD. She also had a hx severe PNA with empyema and chest tube (90's). She has DOE and difficulty walking. The decrease in function has come on gradually. Her cardiac stress test was negative as below. PFT show obstruction with a DLCO defect.   PFT 12/06/13 >> severe AFL without BD response.          Acute OV 04/30/2018 --[name] is 4, follows here for sarcoidosis with associated obstructive lung disease and bronchiectasis.  She also has a history of coronary artery disease, GERD, allergic rhinitis with associated cough.  She is on zyrtec, omeprazole.  Her last chest imaging was done 11/09/2017, showed stable bronchiectasis.  She is here today reporting increased nasal congestion and drainage beginning two weeks ago. Then sore throat about 4-5 days ago. Increased dyspnea, cough with yellowish mucous, chest tightness. No known sick contacts, no travel. No fevers. She restarted her nasal steroid about 4 days ago.   ROV 05/08/2018 --follow-up visit for 84 year old woman with sarcoidosis, associated COPD and bronchiectasis.  She also has allergic rhinitis and GERD associated with cough.  I saw her on 3/9 with new symptoms consistent with a flare that included increased cough, purulent sputum, dyspnea.  She received prednisone and doxycycline.  Today she reports that she is much improved. She is still having a small amount of cough, no real sputum. She remains on flonase and claritin. Uses flutter only prn, rarely.   ROV 11/07/2018 --[name] is 84, has a history of sarcoidosis with associated obstructive lung disease.  Also bronchiectasis, allergic rhinitis, GERD with associated cough.  She reports today that she is having a lot of nasal gtt, clear drainage.  She is on zyrtec,  flonase, saline spray. Daily cough, also at night. Rarely will wake her from sleeping. Cough is non-productive. No CP, no SOB. She is on omeprazole 40mg  bid - occasional breakthrough GERD.    Review of Systems  Constitutional: Negative for fever and unexpected weight change.  HENT: Negative for congestion, dental problem, ear pain, nosebleeds, postnasal drip, rhinorrhea, sinus pressure, sneezing, sore throat and trouble swallowing.   Eyes: Negative for redness and itching.  Respiratory: Negative for cough, chest tightness, shortness of breath and wheezing.   Cardiovascular: Negative for palpitations and leg swelling.  Gastrointestinal: Negative for nausea and vomiting.  Genitourinary: Negative for dysuria.  Musculoskeletal: Negative for joint swelling.  Skin: Negative for rash.  Neurological: Negative for headaches.  Hematological: Does not bruise/bleed easily.  Psychiatric/Behavioral: Negative for dysphoric mood. The patient is not nervous/anxious.       Objective:   Physical Exam Vitals:   11/07/18 1450  BP: 116/66  Pulse: 70  SpO2: 94%  Weight: 55.8 kg   Gen: Pleasant, kyphotic elderly woman, in no distress,  normal affect  ENT: No lesions,  mouth clear,  oropharynx clear, no postnasal drip  Neck: No JVD, no stridor  Lungs: No use of accessory muscles, no wheeze or crackles  Cardiovascular: RRR, heart sounds normal, no murmur or gallops, no peripheral edema  Musculoskeletal: No deformities, no cyanosis or clubbing  Neuro: alert, non focal  Skin: Warm, no lesions or rashes       Assessment & Plan:  Bronchiectasis without complication (  [name]) We will hold off on repeating your CT scan of the chest for now.  We will probably do so in 1 year or sooner if you develop new symptoms including increased cough, increased mucus production. Get the flu shot this Fall  Follow with Dr [name] in 3 months or sooner if you have any problems.  Allergic rhinitis Please continue your  fluticasone nasal spray and Zyrtec as you have been taking them. Try using ipratropium nasal spray, 2 sprays each nostril 2-3 times daily for congestion and drainage.  Cautioned this medication can sometimes cause significant dryness  GERD Please continue omeprazole 40 mg twice a day as you have been taking it.  Baltazar Apo, MD, PhD 12/01/2018, 5:11 PM [facility] [phone] or if no answer [phone]

## 2018-11-07 NOTE — Patient Instructions (Addendum)
Please continue your fluticasone nasal spray and Zyrtec as you have been taking them. Try using ipratropium nasal spray, 2 sprays each nostril 2-3 times daily for congestion and drainage.  Cautioned this medication can sometimes cause significant dryness Please continue omeprazole 40 mg twice a day as you have been taking it. We will hold off on repeating your CT scan of the chest for now.  We will probably do so in 1 year or sooner if you develop new symptoms including increased cough, increased mucus production. Get the flu shot this Fall  Follow with Dr [name] in 3 months or sooner if you have any problems.

## 2018-11-09 ENCOUNTER — Telehealth: Payer: Medicare Other | Admitting: Physician Assistant

## 2018-11-28 ENCOUNTER — Ambulatory Visit (INDEPENDENT_AMBULATORY_CARE_PROVIDER_SITE_OTHER): Payer: Medicare Other | Admitting: Physician Assistant

## 2018-11-28 ENCOUNTER — Encounter: Payer: Self-pay | Admitting: Physician Assistant

## 2018-11-28 ENCOUNTER — Other Ambulatory Visit: Payer: Self-pay

## 2018-11-28 VITALS — BP 118/62 | HR 58 | Ht 63.0 in | Wt 125.8 lb

## 2018-11-28 DIAGNOSIS — I2510 Atherosclerotic heart disease of native coronary artery without angina pectoris: Secondary | ICD-10-CM | POA: Diagnosis not present

## 2018-11-28 DIAGNOSIS — I6523 Occlusion and stenosis of bilateral carotid arteries: Secondary | ICD-10-CM | POA: Diagnosis not present

## 2018-11-28 DIAGNOSIS — E785 Hyperlipidemia, unspecified: Secondary | ICD-10-CM

## 2018-11-28 NOTE — Patient Instructions (Signed)
Medication Instructions:   Your physician recommends that you continue on your current medications as directed. Please refer to the Current Medication list given to you today.  If you need a refill on your cardiac medications before your next appointment, please call your pharmacy.   Lab work:  None ordered today, please have your primary care doctor fax [place] your labs from your upcoming appointment in December. Please fax to [employer], PA-C. Our fax number is [phone].  Testing/Procedures:  None ordered today  Follow-Up: At [facility], you and your health needs are our priority.  As part of our continuing mission to provide you with exceptional heart care, we have created designated Provider Care Teams.  These Care Teams include your primary Cardiologist (physician) and Advanced Practice Providers (APPs -  Physician Assistants and Nurse Practitioners) who all work together to provide you with the care you need, when you need it. You will need a follow up appointment in:  6 months.  Please call our office 2 months in advance to schedule this appointment.  You may see Sherren Mocha, MD or one of the following Advanced Practice Providers on your designated Care Team: [name], PA-C [place], [place] . Daune Perch, NP

## 2018-11-28 NOTE — Progress Notes (Signed)
Cardiology Office Note:    Date:  11/28/2018   ID:  SYMPHANIE Mcgrath, DOB [birth year], MRN [id number]  PCP:  [name], MD  Cardiologist:  Sherren Mocha, MD   Electrophysiologist:  None   Referring MD: [name], MD   Chief Complaint  Patient presents with  . Follow-up    CAD    History of Present Illness:    [name] is a 84 y.o. female with:   Coronary artery disease  Cath 3/17: oLCx 60-70 (neg by FFR)  Myoview 11/16: no ischemia  Pulmonary sarcoidosis  Hypertension   Hyperlipidemia   SVT  Carotid stenosis  [place] 06/2018: bilat 1-39   [name] was last seen in May 2020 for a telemedicine visit.  She has some chest discomfort that was somewhat atypical.  Follow-up Myoview was low risk.  She returns for follow-up.  She is here alone.  She has done well on higher dose omeprazole.  She has followed up with her gastroenterologist.  She has not had significant chest pain.  Her breathing is better now that the humidity is lower.  She has not had syncope or significant lower extremity swelling.   Prior CV studies:   The following studies were reviewed today:  Myoview 08/02/2018  Nuclear stress EF: 73%.  There was no ST segment deviation noted during stress.  This is a low risk study.  The left ventricular ejection fraction is hyperdynamic (>65%).  Occasional PVCs with stress and in recovery.   Significant extracardiac uptake on rest, with some improvement on stress. Defect in inferoapex is likely artifact. However with the degree of extracardiac uptake present, if clinical suspicion for CAD and ischemia is high, consider alternate stress imaging modality such as coronary CTA.  Probable low risk study.   Carotid US 07/09/2018 Bilat ICA 1-39  Carotid US 10/17 bilat ICA 1-39>> FUprn  LHC 05/20/15 LAD plaque - nonobstructive LCx ost 70% >> FFR 0.99 > 0.95 (not hemodynamically significant); mid 40% RCA mid 30% EF 55-65% 1. Moderate  single-vessel CAD involving the ostium of the LCx. Interrogated with FFR which is negative. 2. Widely patent LAD and RCA. 3. Normal LV function. Recommendations: Medical therapy for nonobstructive CAD.  Myoview 11/16 EF 75%, no ischemia, low risk  High Resolution CT 12/02/14 IMPRESSION: 1. No findings to suggest interstitial lung disease. 2. The overall appearance the chest is very similar to the prior study 11/28/2012. Findings are nonspecific, but could be seen in the setting of reported sarcoidosis, as detailed above. 3. Areas of bronchiectasis are noted in the right lung, most severe in the medial segment of the right middle lobe where there are areas of cystic bronchiectasis and architectural distortion. 4. Atherosclerosis, including left main and 3 vessel coronary artery disease. Assessment for potential risk factor modification, dietary therapy or pharmacologic therapy may be warranted, if clinically indicated. 5. Additional incidental findings, as above.  Myoview 10/15 Normal stress nuclear study.LV Ejection Fraction: 70%.  Echo 10/15 EF 60-65%, no RWMA  Carotid US 9/15 Bilateral 1-39% ICA >> FU 2 years  Past Medical History:  Diagnosis Date  . Allergy    SEASONAL  . Arthritis   . Asthma    "slight"   . Baker'Sherri cyst of knee, right   . CAD (coronary artery disease)    a. Myoview 10/15 - normal EF 70% // Myoview 11/16: EF 75%, normal perfusion, (there was no blood pressure drop at low level exercise with Lexiscan infusion), low risk study //  c. LHC 3/17 - LAD irregs, oLCx 70 (neg FFR), mRCA 30, EF 55-65% >> med Rx // Myoview 07/2018:  EF 73, extracardiac uptake, no significant ischemia (reviewed with Dr. Burt Knack), Low Risk   . Carotid stenosis    a. Carotid US 9/15 - Bilateral 1-39% ICA >> FU 2 years // b. Bilateral ICA 1-39 >> FU prn // Carotid US 06/2018: bilat 1-39; fu prn  . Dyspnea    due to Sarcoidosis  . Elbow fracture 04/2017   Right, had surgery  .  Esophageal reflux   . Hematochezia   . History of echocardiogram    a. Echo 10/15 - EF 60-65%, no RWMA  . HLD (hyperlipidemia)   . Hx of colonoscopy   . Osteoporosis   . Other chronic pulmonary heart diseases   . Paroxysmal supraventricular tachycardia ([place])   . Pneumonia   . Sarcoidosis    Surgical Hx: The patient  has a past surgical history that includes Trapezium resection (Right); Carpal tunnel release (Left); Shoulder arthroscopy w/ rotator cuff repair (Right); Foot surgery (Right); Bladder surgery; arm surgery (Right); Colonoscopy; Polypectomy; Hip surgery (Right, 2011); Leg Surgery (Left, May 2014); Cardiac catheterization (N/A, 05/20/2015); and ORIF elbow fracture (Right, 05/09/2017).   Current Medications: Current Meds  Medication Sig  . acetaminophen (TYLENOL) 500 MG tablet Take 1,000 mg by mouth every 6 (six) hours as needed.  Marland Kitchen albuterol (PROVENTIL HFA;VENTOLIN HFA) 108 (90 Base) MCG/ACT inhaler Inhale 2 puffs every 4 (four) hours as needed into the lungs for wheezing or shortness of breath.  Marland Kitchen aspirin EC 81 MG tablet Take 1 tablet (81 mg total) by mouth daily.  . calcium carbonate (TUMS - DOSED IN MG ELEMENTAL CALCIUM) 500 MG chewable tablet Chew 1 tablet by mouth daily. Pt isn't sure of dose.  Marland Kitchen CARTIA XT 180 MG 24 hr capsule TAKE 1 CAPSULE(180 MG) BY MOUTH DAILY  . cetirizine (ZYRTEC) 10 MG tablet Take 10 mg by mouth daily.  Marland Kitchen denosumab (PROLIA) 60 MG/ML SOLN injection Inject 60 mg into the skin every 6 (six) months. Administer in upper arm, thigh, or abdomen  . fluticasone (FLONASE) 50 MCG/ACT nasal spray Place into both nostrils as needed for allergies or rhinitis.  Marland Kitchen ipratropium (ATROVENT) 0.03 % nasal spray Place 2 sprays into both nostrils 4 (four) times daily as needed for rhinitis.  Marland Kitchen nitroGLYCERIN (NITROSTAT) 0.4 MG SL tablet Place 1 tablet (0.4 mg total) under the tongue every 5 (five) minutes as needed for chest pain.  Marland Kitchen omeprazole (PRILOSEC) 40 MG capsule Take 40  mg by mouth 2 (two) times daily.   . rosuvastatin (CRESTOR) 10 MG tablet TAKE 1 TABLET 3 TIMES A WEEK AS DIRECTED (DOSE CHANGE)  . traMADol (ULTRAM) 50 MG tablet Take 50 mg by mouth every 6 (six) hours as needed for severe pain.   . Vitamin D, Ergocalciferol, (DRISDOL) 50000 units CAPS capsule Take 50,000 Units by mouth every 7 (seven) days.     Allergies:   Levaquin [levofloxacin], Sulfonamide derivatives, Morphine, Oxycodone-acetaminophen, and Percocet [oxycodone-acetaminophen]   Social History   Tobacco Use  . Smoking status: Never Smoker  . Smokeless tobacco: Never Used  Substance Use Topics  . Alcohol use: No  . Drug use: No     Family Hx: The patient'Sherri family history includes Arrhythmia in her father; Cancer in her father, mother, and sister; Colon cancer (age of onset: 23) in her mother; Heart attack in her brother; Heart disease in her father; Lung cancer in her  father. There is no history of Esophageal cancer, Rectal cancer, Stomach cancer, or Stroke.  ROS:   Please see the history of present illness.    ROS All other systems reviewed and are negative.   EKGs/Labs/Other Test Reviewed:    EKG:  EKG is   ordered today.  The ekg ordered today demonstrates sinus bradycardia, heart rate 58, left axis deviation, septal Q waves, QTC 418, no change from prior tracing  Recent Labs: No results found for requested labs within last 8760 hours.   Recent Lipid Panel Lab Results  Component Value Date/Time   CHOL 149 11/27/2017 08:00 AM   TRIG 67 11/27/2017 08:00 AM   HDL 73 11/27/2017 08:00 AM   CHOLHDL 2.0 11/27/2017 08:00 AM   CHOLHDL 2.0 11/03/2015 07:42 AM   LDLCALC 63 11/27/2017 08:00 AM    Physical Exam:    VS:  BP 118/62   Pulse (!) 58   Ht 5\' 3"  (1.6 m)   Wt 125 lb 12.8 oz (57.1 kg)   LMP 02/22/1983 (Approximate)   SpO2 96%   BMI 22.28 kg/m     Wt Readings from Last 3 Encounters:  11/28/18 125 lb 12.8 oz (57.1 kg)  11/07/18 123 lb (55.8 kg)  08/02/18 125 lb  (56.7 kg)     Physical Exam  Constitutional: She is oriented to person, place, and time. She appears well-developed and well-nourished. No distress.  HENT:  Head: Normocephalic and atraumatic.  Neck: Neck supple. No JVD present. Carotid bruit is not present.  Cardiovascular: Normal rate, regular rhythm, S1 normal, S2 normal and normal heart sounds.  No murmur heard. Pulmonary/Chest: Effort normal. She has no rales.  Abdominal: Soft. There is no hepatomegaly.  Musculoskeletal:        General: No edema.  Neurological: She is alert and oriented to person, place, and time.  Skin: Skin is warm and dry.    ASSESSMENT & PLAN:    1. Coronary artery disease involving native coronary artery of native heart without angina pectoris Moderate LCx disease by cardiac catheterization 2017.  This was not hemodynamically significant by FFR.  Recent [facility] June 2020 demonstrated no ischemia.  She is not having anginal symptoms and actually noted improved symptoms on higher dose proton pump inhibitor therapy.  Continue aspirin, statin.  2. Hyperlipidemia, unspecified hyperlipidemia type Most recent LDL 99.  I would suggest that we try to get her LDL <70.  She notes significant myalgias with higher doses of statin therapy.  She has a follow-up lipid panel pending in December with her primary care provider.  If her LDL at that time is still >70, I would suggest adding Zetia to her medical regimen.   Dispo:  Return in about 6 months (around 05/29/2019) for Routine Follow Up, w/ Dr. Burt Knack, or [name], PA-C, (virtual or in-person).   Medication Adjustments/Labs and Tests Ordered: Current medicines are reviewed at length with the patient today.  Concerns regarding medicines are outlined above.  Tests Ordered: Orders Placed This Encounter  Procedures  . EKG 12-Lead   Medication Changes: No orders of the defined types were placed in this encounter.   Signed, [name], PA-C  11/28/2018  4:40 PM    [facility] [address], [place], [place]  [address] Phone: [phone]; Fax: [phone]

## 2018-12-01 NOTE — Assessment & Plan Note (Signed)
Please continue omeprazole 40 mg twice a day as you have been taking it.

## 2018-12-01 NOTE — Assessment & Plan Note (Signed)
We will hold off on repeating your CT scan of the chest for now.  We will probably do so in 1 year or sooner if you develop new symptoms including increased cough, increased mucus production. Get the flu shot this Fall  Follow with Dr [name] in 3 months or sooner if you have any problems.

## 2018-12-01 NOTE — Assessment & Plan Note (Signed)
Please continue your fluticasone nasal spray and Zyrtec as you have been taking them. Try using ipratropium nasal spray, 2 sprays each nostril 2-3 times daily for congestion and drainage.  Cautioned this medication can sometimes cause significant dryness

## 2018-12-11 ENCOUNTER — Inpatient Hospital Stay (HOSPITAL_COMMUNITY)
Admission: EM | Admit: 2018-12-11 | Discharge: 2018-12-15 | DRG: 536 | Disposition: A | Discharge status: Skilled Nursing Facility | Payer: Medicare Other | Attending: Family Medicine | Admitting: Family Medicine

## 2018-12-11 ENCOUNTER — Encounter (HOSPITAL_COMMUNITY): Payer: Self-pay

## 2018-12-11 ENCOUNTER — Other Ambulatory Visit: Payer: Self-pay

## 2018-12-11 ENCOUNTER — Emergency Department (HOSPITAL_COMMUNITY): Payer: Medicare Other

## 2018-12-11 DIAGNOSIS — Z885 Allergy status to narcotic agent status: Secondary | ICD-10-CM

## 2018-12-11 DIAGNOSIS — E785 Hyperlipidemia, unspecified: Secondary | ICD-10-CM | POA: Diagnosis present

## 2018-12-11 DIAGNOSIS — Z860100 Personal history of colon polyps, unspecified: Secondary | ICD-10-CM

## 2018-12-11 DIAGNOSIS — Z96641 Presence of right artificial hip joint: Secondary | ICD-10-CM | POA: Diagnosis present

## 2018-12-11 DIAGNOSIS — J479 Bronchiectasis, uncomplicated: Secondary | ICD-10-CM

## 2018-12-11 DIAGNOSIS — S32591A Other specified fracture of right pubis, initial encounter for closed fracture: Secondary | ICD-10-CM | POA: Diagnosis not present

## 2018-12-11 DIAGNOSIS — K219 Gastro-esophageal reflux disease without esophagitis: Secondary | ICD-10-CM | POA: Diagnosis present

## 2018-12-11 DIAGNOSIS — R0609 Other forms of dyspnea: Secondary | ICD-10-CM | POA: Diagnosis present

## 2018-12-11 DIAGNOSIS — M9701XA Periprosthetic fracture around internal prosthetic right hip joint, initial encounter: Secondary | ICD-10-CM | POA: Diagnosis present

## 2018-12-11 DIAGNOSIS — Z8249 Family history of ischemic heart disease and other diseases of the circulatory system: Secondary | ICD-10-CM

## 2018-12-11 DIAGNOSIS — Z7982 Long term (current) use of aspirin: Secondary | ICD-10-CM

## 2018-12-11 DIAGNOSIS — S32511A Fracture of superior rim of right pubis, initial encounter for closed fracture: Principal | ICD-10-CM | POA: Diagnosis present

## 2018-12-11 DIAGNOSIS — N179 Acute kidney failure, unspecified: Secondary | ICD-10-CM | POA: Diagnosis present

## 2018-12-11 DIAGNOSIS — S72009A Fracture of unspecified part of neck of unspecified femur, initial encounter for closed fracture: Secondary | ICD-10-CM | POA: Diagnosis present

## 2018-12-11 DIAGNOSIS — I471 Supraventricular tachycardia: Secondary | ICD-10-CM | POA: Diagnosis present

## 2018-12-11 DIAGNOSIS — Z881 Allergy status to other antibiotic agents status: Secondary | ICD-10-CM

## 2018-12-11 DIAGNOSIS — Z882 Allergy status to sulfonamides status: Secondary | ICD-10-CM

## 2018-12-11 DIAGNOSIS — M8448XA Pathological fracture, other site, initial encounter for fracture: Secondary | ICD-10-CM

## 2018-12-11 DIAGNOSIS — W19XXXA Unspecified fall, initial encounter: Secondary | ICD-10-CM | POA: Diagnosis present

## 2018-12-11 DIAGNOSIS — M810 Age-related osteoporosis without current pathological fracture: Secondary | ICD-10-CM | POA: Diagnosis present

## 2018-12-11 DIAGNOSIS — D869 Sarcoidosis, unspecified: Secondary | ICD-10-CM | POA: Diagnosis present

## 2018-12-11 DIAGNOSIS — Z79899 Other long term (current) drug therapy: Secondary | ICD-10-CM

## 2018-12-11 DIAGNOSIS — Z20828 Contact with and (suspected) exposure to other viral communicable diseases: Secondary | ICD-10-CM | POA: Diagnosis present

## 2018-12-11 DIAGNOSIS — Z8701 Personal history of pneumonia (recurrent): Secondary | ICD-10-CM

## 2018-12-11 DIAGNOSIS — I2510 Atherosclerotic heart disease of native coronary artery without angina pectoris: Secondary | ICD-10-CM | POA: Diagnosis present

## 2018-12-11 DIAGNOSIS — K5900 Constipation, unspecified: Secondary | ICD-10-CM | POA: Diagnosis not present

## 2018-12-11 DIAGNOSIS — I10 Essential (primary) hypertension: Secondary | ICD-10-CM | POA: Diagnosis present

## 2018-12-11 DIAGNOSIS — R0602 Shortness of breath: Secondary | ICD-10-CM | POA: Diagnosis present

## 2018-12-11 LAB — CBC WITH DIFFERENTIAL/PLATELET
Abs Immature Granulocytes: 0.18 10*3/uL — ABNORMAL HIGH (ref 0.00–0.07)
Basophils Absolute: 0 10*3/uL (ref 0.0–0.1)
Basophils Relative: 0 %
Eosinophils Absolute: 0.1 10*3/uL (ref 0.0–0.5)
Eosinophils Relative: 1 %
HCT: 36.5 % (ref 36.0–46.0)
Hemoglobin: 11.9 g/dL — ABNORMAL LOW (ref 12.0–15.0)
Immature Granulocytes: 2 %
Lymphocytes Relative: 10 %
Lymphs Abs: 0.9 10*3/uL (ref 0.7–4.0)
MCH: 32.7 pg (ref 26.0–34.0)
MCHC: 32.6 g/dL (ref 30.0–36.0)
MCV: 100.3 fL — ABNORMAL HIGH (ref 80.0–100.0)
Monocytes Absolute: 0.8 10*3/uL (ref 0.1–1.0)
Monocytes Relative: 8 %
Neutro Abs: 7.8 10*3/uL — ABNORMAL HIGH (ref 1.7–7.7)
Neutrophils Relative %: 79 %
Platelets: 174 10*3/uL (ref 150–400)
RBC: 3.64 MIL/uL — ABNORMAL LOW (ref 3.87–5.11)
RDW: 13.8 % (ref 11.5–15.5)
WBC: 9.9 10*3/uL (ref 4.0–10.5)
nRBC: 0 % (ref 0.0–0.2)

## 2018-12-11 LAB — BASIC METABOLIC PANEL
Anion gap: 9 (ref 5–15)
BUN: 26 mg/dL — ABNORMAL HIGH (ref 8–23)
CO2: 24 mmol/L (ref 22–32)
Calcium: 8.3 mg/dL — ABNORMAL LOW (ref 8.9–10.3)
Chloride: 103 mmol/L (ref 98–111)
Creatinine, Ser: 0.79 mg/dL (ref 0.44–1.00)
GFR calc Af Amer: >60 mL/min (ref >60)
GFR calc non Af Amer: >60 mL/min (ref >60)
Glucose, Bld: 97 mg/dL (ref 70–99)
Potassium: 3.6 mmol/L (ref 3.5–5.1)
Sodium: 136 mmol/L (ref 135–145)

## 2018-12-11 MED ORDER — HYDROMORPHONE HCL 1 MG/ML IJ SOLN
0.5000 mg | Freq: Once | INTRAMUSCULAR | Status: AC
  Administered 2018-12-11: 0.5 mg via INTRAVENOUS
  Filled 2018-12-11: qty 1

## 2018-12-11 MED ORDER — IBUPROFEN 400 MG PO TABS
400.0000 mg | ORAL_TABLET | Freq: Once | ORAL | Status: AC
  Administered 2018-12-11: 400 mg via ORAL
  Filled 2018-12-11: qty 1

## 2018-12-11 MED ORDER — LORAZEPAM 2 MG/ML IJ SOLN
0.5000 mg | Freq: Once | INTRAMUSCULAR | Status: AC
  Administered 2018-12-11: 20:00:00 0.5 mg via INTRAVENOUS
  Filled 2018-12-11: qty 1

## 2018-12-11 NOTE — Care Management (Addendum)
ED CM consulted concerning assistance with transitional care planning.  [place] where she was evaluated and diagnosed with pelvic fracture as per daughter.  Patient continue have pain,  was unable to weight bear daughter drove her back to [place], where she had to pull off the road and call EMS due to pain.  ED CM met with patient and daughter [name] [phone]). Daughter states, patient has been crying out in pain, and she is not able to care for patient safely at home. Daughter's spouse just hade knee surgery last week and is now alone [place] the beach.  CM explained that patient base on ED evaluation thus far patient is not meeting criteria for an inpatient stay, and patient has not had a 3 days. Discussed possibility of self pay options, daughter believes they will be able to arrange it, she has a long-term policy that covers that she has used in the past. Daughter will get the information to the [facility] team first thing tomorrow morning. CM updated EDP and PT evaluation was ordered to be done in the morning.  Will handoff to daytime TOC team to follow up.

## 2018-12-11 NOTE — TOC Initial Note (Signed)
Transition of Care [facility]) - Initial/Assessment Note    Patient Details  Name: [name] MRN: [id number] Date of Birth: 1934-04-21  Transition of Care [facility]) CM/SW Contact:    [place], LCSW Phone Number: 12/11/2018, 11:02 PM  Clinical Narrative:   CSW at bedside to address consult and to conduct TOC assessment. Pts daughter, [name], at bedside as well.   [name] explains that patient recently fell while at the beach on vacation. [name] goes into detail and explains that patient resides at home with her. Pt does not use any DME equipment although [name] explains that her mother often bumps into furniture and has a hx of falls.   Patient has 3 SNF admissions. Twice at [facility] and once at a facility in [place].  [name] explains that she has noticed a decline in her cognition and in her overall wellbeing. [name] states that she is not comfortable with patient discharging home with her as she can not assist her with her ADLs and hygiene as she is not strong enough to lift her. [name] also states that all her other family members ares still down at the beach and are unable to assist at this time.   Pt is awaiting a PT evaluation in the morning. Family's goal is for patient to discharge to facility to receive short term rehab. TOC team will continue to follow patient for any discharge needs.  [facility]  Clinical Social Worker  Ph: [phone]                  Expected Discharge Plan: Skilled Nursing Facility Barriers to Discharge: Continued Medical Work up   Patient Goals and CMS Choice Patient states their goals for this hospitalization and ongoing recovery are:: for patient to receive short term rehab CMS Medicare.gov Compare Post Acute Care list provided to:: Patient Represenative (must comment)(to patients daughter [name]) Choice offered to / list presented to : Adult Children  Expected Discharge Plan and Services Expected  Discharge Plan: [place]   Discharge Planning Services: CM Consult Post Acute Care Choice: [place] Living arrangements for the past 2 months: Single Family Home                                      Prior Living Arrangements/Services Living arrangements for the past 2 months: Single Family Home Lives with:: Adult Children Patient language and need for interpreter reviewed:: Yes Do you feel safe going back to the place where you live?: No   patient lives with daughter who explains that she can not assist pt with transfering or ambulating  Need for Family Participation in Patient Care: Yes (Comment) Care giver support system in place?: Yes (comment)   Criminal Activity/Legal Involvement Pertinent to Current Situation/Hospitalization: No - Comment as needed  Activities of Daily Living      Permission Sought/Granted Permission sought to share information with : Case Manager, Customer service manager Permission granted to share information with : Yes, Verbal Permission Granted  Share Information with NAME: [name]     Permission granted to share info w Relationship: Daughter  Permission granted to share info w Contact Information: PH: [phone]  Emotional Assessment Appearance:: Appears stated age Attitude/Demeanor/Rapport: Other (comment)(patient sleeping) Affect (typically observed): Unable to Assess Orientation: : Oriented to Self, Oriented to Place, Oriented to  Time, Oriented to Situation Alcohol / Substance Use:  Not Applicable Psych Involvement: No (comment)  Admission diagnosis:  Fall, R hip pain Patient Active Problem List   Diagnosis Date Noted  . ETD (Eustachian tube dysfunction), bilateral 11/07/2017  . Chronic nonintractable headache 11/07/2017  . Aftercare 06/06/2017  . Closed fracture of right olecranon process 05/09/2017  . Pain in joint of right elbow 05/02/2017  . Rib lesion 04/20/2017  .  Bronchiectasis without complication ([place]) 123XX123  . Allergic rhinitis 09/15/2015  . CAD (coronary artery disease) 05/18/2015  . Essential hypertension 05/18/2015  . Carotid artery disease ([place]) 05/18/2015  . Cough 03/19/2015  . Left foot pain 09/02/2014  . DOE (dyspnea on exertion) 12/26/2013  . Abnormality of gait 11/06/2013  . Scoliosis (and kyphoscoliosis), idiopathic 07/30/2013  . 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 Constipation 09/05/2012  . History of sinus tachycardia 09/05/2012  . Closed left subtrochanteric femur fracture S/P Open/closed and reduction, internal medullary fixation  09/05/2012  . Acute posthemorrhagic anemia 08/22/2012  . Lumbar radiculopathy 11/23/2011  . Leg weakness 11/23/2011  . Mitral regurgitation 11/22/2011  . DIARRHEA-PRESUMED INFECTIOUS 08/18/2008  . RECTAL BLEEDING 08/18/2008  . PERSONAL HX COLONIC POLYPS 08/18/2008  . DEGENERATIVE JOINT DISEASE 08/14/2008  . SKIN CANCER, HX OF 08/14/2008  . CARPAL TUNNEL SYNDROME, HX OF 08/14/2008  . SARCOIDOSIS, PULMONARY 08/08/2008  . HLD (hyperlipidemia) 08/08/2008  . Paroxysmal supraventricular tachycardia ([place]) 08/08/2008  . GERD 08/08/2008   PCP:  [name], MD Pharmacy:   [facility] [place] [place] [address] Phone: [phone] Fax: [facility] [place], [address] [place] [address] Phone: [phone] Fax: [phone]     Social Determinants of Health (SDOH) Interventions    Readmission Risk Interventions No flowsheet data found.

## 2018-12-11 NOTE — ED Notes (Signed)
Patient transported to CT 

## 2018-12-11 NOTE — ED Provider Notes (Signed)
[facility] EMERGENCY DEPARTMENT Provider Note   CSN: [id number] Arrival date & time: 12/11/18  1859     History   Chief Complaint Chief Complaint  Patient presents with  . Fall  . Hip Pain    HPI [name] is a 84 y.o. female.     HPI   84 year old female with right hip/pelvic pain.  She reports that she fell yesterday.  She was out of town at [employer] and went to a local hospital there.  She reports that she had a CT scan of her head and x-rays of her right hip.  She initially told me that she had "a broken hip."  But when asked further it sounds like was actually pelvic fractures.  She was provided with a walker and pain medicine.  She states that she is just unable to manage at home despite her family's help.  Past Medical History:  Diagnosis Date  . Allergy    SEASONAL  . Arthritis   . Asthma    "slight"   . Baker's cyst of knee, right   . CAD (coronary artery disease)    a. Myoview 10/15 - normal EF 70% // Myoview 11/16: EF 75%, normal perfusion, (there was no blood pressure drop at low level exercise with Lexiscan infusion), low risk study // c. LHC 3/17 - LAD irregs, oLCx 70 (neg FFR), mRCA 30, EF 55-65% >> med Rx // Myoview 07/2018:  EF 73, extracardiac uptake, no significant ischemia (reviewed with Dr. Burt Knack), Low Risk   . Carotid stenosis    a. Carotid US 9/15 - Bilateral 1-39% ICA >> FU 2 years // b. Bilateral ICA 1-39 >> FU prn // Carotid US 06/2018: bilat 1-39; fu prn  . Dyspnea    due to Sarcoidosis  . Elbow fracture 04/2017   Right, had surgery  . Esophageal reflux   . Hematochezia   . History of echocardiogram    a. Echo 10/15 - EF 60-65%, no RWMA  . HLD (hyperlipidemia)   . Hx of colonoscopy   . Osteoporosis   . Other chronic pulmonary heart diseases   . Paroxysmal supraventricular tachycardia ([place])   . Pneumonia   . Sarcoidosis     Patient Active Problem List   Diagnosis Date Noted  . ETD (Eustachian tube  dysfunction), bilateral 11/07/2017  . Chronic nonintractable headache 11/07/2017  . Aftercare 06/06/2017  . Closed fracture of right olecranon process 05/09/2017  . Pain in joint of right elbow 05/02/2017  . Rib lesion 04/20/2017  . Bronchiectasis without complication ([place]) 123XX123  . Allergic rhinitis 09/15/2015  . CAD (coronary artery disease) 05/18/2015  . Essential hypertension 05/18/2015  . Carotid artery disease ([place]) 05/18/2015  . Cough 03/19/2015  . Left foot pain 09/02/2014  . DOE (dyspnea on exertion) 12/26/2013  . Abnormality of gait 11/06/2013  . Scoliosis (and kyphoscoliosis), idiopathic 07/30/2013  . 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 Constipation 09/05/2012  . History of sinus tachycardia 09/05/2012  . Closed left subtrochanteric femur fracture S/P Open/closed and reduction, internal medullary fixation  09/05/2012  . Acute posthemorrhagic anemia 08/22/2012  . Lumbar radiculopathy 11/23/2011  . Leg weakness 11/23/2011  . Mitral regurgitation 11/22/2011  . DIARRHEA-PRESUMED INFECTIOUS 08/18/2008  . RECTAL BLEEDING 08/18/2008  . PERSONAL HX COLONIC POLYPS 08/18/2008  . DEGENERATIVE JOINT DISEASE 08/14/2008  . SKIN CANCER, HX OF 08/14/2008  . CARPAL TUNNEL SYNDROME, HX OF 08/14/2008  . SARCOIDOSIS, PULMONARY 08/08/2008  . HLD (  hyperlipidemia) 08/08/2008  . Paroxysmal supraventricular tachycardia ([place]) 08/08/2008  . GERD 08/08/2008   Past Surgical History:  Procedure Laterality Date  . arm surgery Right   . BLADDER SURGERY    . CARDIAC CATHETERIZATION N/A 05/20/2015   Procedure: Left Heart Cath and Coronary Angiography;  Surgeon: Sherren Mocha, MD;  Location: [facility];  Service: Cardiovascular;  Laterality: N/A;  . CARPAL TUNNEL RELEASE Left   . COLONOSCOPY    . FOOT SURGERY Right   . HIP SURGERY Right 2011   full replacement  . LEG SURGERY Left May 2014   femur fracture s/p open and closed reduction in [place], Dr. Jimmye Norman  .  ORIF ELBOW FRACTURE Right 05/09/2017   Procedure: ORIF right olecranon fracture with repair/reconstruction, ulnar nerve transposition as needed;  Surgeon: Roseanne Kaufman, MD;  Location: [facility];  Service: Orthopedics;  Laterality: Right;  Requests 90 mins  . POLYPECTOMY    . SHOULDER ARTHROSCOPY W/ ROTATOR CUFF REPAIR Right   . TRAPEZIUM RESECTION Right     OB History    Gravida  1   Para  1   Term  1   Preterm  0   AB  0   Living  1     SAB  0   TAB  0   Ectopic  0   Multiple  0   Live Births  1          Home Medications    Prior to Admission medications   Medication Sig Start Date End Date Taking? Authorizing Provider  acetaminophen (TYLENOL) 500 MG tablet Take 1,000 mg by mouth every 6 (six) hours as needed.    [provider]  albuterol (PROVENTIL HFA;VENTOLIN HFA) 108 (90 Base) MCG/ACT inhaler Inhale 2 puffs every 4 (four) hours as needed into the lungs for wheezing or shortness of breath. 01/03/17   Collene Gobble, MD  aspirin EC 81 MG tablet Take 1 tablet (81 mg total) by mouth daily. 12/23/14   [name] T, PA-C  calcium carbonate (TUMS - DOSED IN MG ELEMENTAL CALCIUM) 500 MG chewable tablet Chew 1 tablet by mouth daily. Pt isn't sure of dose.    [provider]  CARTIA XT 180 MG 24 hr capsule TAKE 1 CAPSULE(180 MG) BY MOUTH DAILY 08/02/16   [name] T, PA-C  cetirizine (ZYRTEC) 10 MG tablet Take 10 mg by mouth daily.    [provider]  denosumab (PROLIA) 60 MG/ML SOLN injection Inject 60 mg into the skin every 6 (six) months. Administer in upper arm, thigh, or abdomen    [provider]  fluticasone (FLONASE) 50 MCG/ACT nasal spray Place into both nostrils as needed for allergies or rhinitis.    [provider]  ipratropium (ATROVENT) 0.03 % nasal spray Place 2 sprays into both nostrils 4 (four) times daily as needed for rhinitis. 11/07/18   Collene Gobble, MD  nitroGLYCERIN (NITROSTAT) 0.4 MG SL tablet Place  1 tablet (0.4 mg total) under the tongue every 5 (five) minutes as needed for chest pain. 07/11/17   [name] T, PA-C  omeprazole (PRILOSEC) 40 MG capsule Take 40 mg by mouth 2 (two) times daily.  11/03/13   [provider]  rosuvastatin (CRESTOR) 10 MG tablet TAKE 1 TABLET 3 TIMES A WEEK AS DIRECTED (DOSE CHANGE) 08/27/18   [name] T, PA-C  traMADol (ULTRAM) 50 MG tablet Take 50 mg by mouth every 6 (six) hours as needed for severe pain.  11/22/13   [provider]  Vitamin D, Ergocalciferol, (DRISDOL) 50000 units CAPS capsule Take 50,000 Units by mouth every 7 (seven) days.    [provider]   Family History Family History  Problem Relation Age of Onset  . Colon cancer Mother 31  . Cancer Mother   . Lung cancer Father   . Heart disease Father   . Arrhythmia Father   . Cancer Father   . Cancer Sister        ovarian  . Heart attack Brother   . Esophageal cancer Neg Hx   . Rectal cancer Neg Hx   . Stomach cancer Neg Hx   . Stroke Neg Hx    Social History Social History   Tobacco Use  . Smoking status: Never Smoker  . Smokeless tobacco: Never Used  Substance Use Topics  . Alcohol use: No  . Drug use: No    Allergies   Levaquin [levofloxacin], Sulfonamide derivatives, Morphine, Oxycodone-acetaminophen, and Percocet [oxycodone-acetaminophen]   Review of Systems Review of Systems  All systems reviewed and negative, other than as noted in HPI.  Physical Exam Updated Vital Signs BP (!) 116/45 (BP Location: Right Arm)   Pulse 64   Temp 98.6 F (37 C) (Oral)   Resp 18   Ht 5\' 3"  (1.6 m)   Wt 56.7 kg   LMP 02/22/1983 (Approximate)   SpO2 95%   BMI 22.14 kg/m   Physical Exam Vitals signs and nursing note reviewed.  Constitutional:      General: She is not in acute distress.    Appearance: She is well-developed.  HENT:     Head: Normocephalic and atraumatic.  Eyes:     General:        Right eye: No discharge.        Left eye: No  discharge.     Conjunctiva/sclera: Conjunctivae normal.  Neck:     Musculoskeletal: Neck supple.  Cardiovascular:     Rate and Rhythm: Normal rate and regular rhythm.     Heart sounds: Normal heart sounds. No murmur. No friction rub. No gallop.   Pulmonary:     Effort: Pulmonary effort is normal. No respiratory distress.     Breath sounds: Normal breath sounds.  Abdominal:     General: There is no distension.     Palpations: Abdomen is soft.     Tenderness: There is no abdominal tenderness.  Musculoskeletal:        General: Tenderness present.     Comments: Tenderness to palpation over pubic symphysis into the right hemipelvis.  No shortening or malrotation of the right lower extremity.  Can actively range of the hip although with increased pain.  Neurovascular intact.  Skin:    General: Skin is warm and dry.  Neurological:     Mental Status: She is alert.  Psychiatric:        Behavior: Behavior normal.        Thought Content: Thought content normal.    ED Treatments / Results  Labs (all labs ordered are listed, but only abnormal results are displayed) Labs Reviewed  CBC WITH DIFFERENTIAL/PLATELET - Abnormal; Notable for the following components:      Result Value   RBC 3.64 (*)    Hemoglobin 11.9 (*)    MCV 100.3 (*)    Neutro Abs 7.8 (*)    Abs Immature Granulocytes 0.18 (*)    All other components within normal limits  BASIC METABOLIC PANEL - Abnormal;  Notable for the following components:   BUN 26 (*)    Calcium 8.3 (*)    All other components within normal limits  SARS CORONAVIRUS 2 (TAT 6-24 HRS)   EKG None  Radiology Ct Hip Right Wo Contrast  Result Date: 12/11/2018 CLINICAL DATA:  Hip pain due to trauma. EXAM: CT OF THE RIGHT HIP WITHOUT CONTRAST TECHNIQUE: Multidetector CT imaging of the right hip was performed according to the standard protocol. Multiplanar CT image reconstructions were also generated. COMPARISON:  Radiographs dated 04/29/2017 FINDINGS:  Bones/Joint/Cartilage There is a displaced fracture of the right superior pubic ramus at the junction with the acetabulum extending into the anterior inferior rim of the acetabulum. There is a slightly displaced fracture of the right inferior pubic ramus. There is an impacted vertical fracture of the right sacral ala, incompletely visualized. The right total hip prosthesis appears in good position with no evidence of loosening or fracture of the proximal femur. Muscles and Tendons No acute abnormality. Soft tissues Edema in the subcutaneous fat at the lateral aspect of the right hip consistent with soft tissue contusion. IMPRESSION: 1. Fractures of the right superior and inferior pubic rami as described. 2. Impacted vertical fracture of the right sacral ala, incompletely visualized. 3. Soft tissue contusion at the lateral aspect of the right hip. Electronically Signed   By: Lorriane Shire M.D.   On: 12/11/2018 20:37    Procedures Procedures (including critical care time)  Medications Ordered in ED Medications  HYDROmorphone (DILAUDID) injection 0.5 mg (0.5 mg Intravenous Given 12/11/18 1957)  LORazepam (ATIVAN) injection 0.5 mg (0.5 mg Intravenous Given 12/11/18 1957)  ibuprofen (ADVIL) tablet 400 mg (400 mg Oral Given 12/11/18 1957)    Initial Impression / Assessment and Plan / ED Course  I have reviewed the triage vital signs and the nursing notes.  Pertinent labs & imaging results that were available during my care of the patient were reviewed by me and considered in my medical decision making (see chart for details).  84yF with R hip/pelvis pain after mechanical fall. Pubic rami fxs. The superior ramus fx os at junction of acetabulum and extends into anterior/inferior rim. I'm not sure if that changes management or note. Will discuss with ortho.  Weightbearing as tolerated.  Is care management meeting with patient.  Family is unable to manage at home.  Will hold the emergency room overnight  for physical therapy evaluation in the morning and to see if we could provide her with assistance making additional arrangements for help.  Final Clinical Impressions(s) / ED Diagnoses   Final diagnoses:  Closed fracture of ramus of right pubis, initial encounter [facility])    ED Discharge Orders    None       Virgel Manifold, MD 12/11/18 2305

## 2018-12-11 NOTE — ED Triage Notes (Signed)
[place] beach but hadd to pull over due to pain. Pt had fallen yesterday and was seen at [facility] hospital and told she had right hip fracture. Pt c/o 10/10 pain and given 100 mcg fentanyl PTA by ems. Pain now 2/10.

## 2018-12-11 NOTE — Progress Notes (Signed)
Asked to review CT scan by Dr. Wilson Singer, CT demonstrates a right periprosthetic acetabular fracture extending from the ramus to the inferior aspect of the acetabulum.  The cup appears stable, as does the femoral prosthesis.  She should be okay to weight-bear as tolerated, and follow-up as an outpatient in the next week.  If she does not have adequate ambulatory support, or adequate family support, then she may need admission.  Johnny [place], MD

## 2018-12-12 DIAGNOSIS — I2510 Atherosclerotic heart disease of native coronary artery without angina pectoris: Secondary | ICD-10-CM | POA: Diagnosis present

## 2018-12-12 DIAGNOSIS — S32511A Fracture of superior rim of right pubis, initial encounter for closed fracture: Secondary | ICD-10-CM | POA: Diagnosis present

## 2018-12-12 DIAGNOSIS — J479 Bronchiectasis, uncomplicated: Secondary | ICD-10-CM | POA: Diagnosis present

## 2018-12-12 DIAGNOSIS — Z885 Allergy status to narcotic agent status: Secondary | ICD-10-CM | POA: Diagnosis not present

## 2018-12-12 DIAGNOSIS — Z79899 Other long term (current) drug therapy: Secondary | ICD-10-CM | POA: Diagnosis not present

## 2018-12-12 DIAGNOSIS — W19XXXA Unspecified fall, initial encounter: Secondary | ICD-10-CM | POA: Diagnosis present

## 2018-12-12 DIAGNOSIS — M8448XA Pathological fracture, other site, initial encounter for fracture: Secondary | ICD-10-CM | POA: Diagnosis present

## 2018-12-12 DIAGNOSIS — S72009A Fracture of unspecified part of neck of unspecified femur, initial encounter for closed fracture: Secondary | ICD-10-CM | POA: Diagnosis present

## 2018-12-12 DIAGNOSIS — Z881 Allergy status to other antibiotic agents status: Secondary | ICD-10-CM | POA: Diagnosis not present

## 2018-12-12 DIAGNOSIS — E785 Hyperlipidemia, unspecified: Secondary | ICD-10-CM | POA: Diagnosis present

## 2018-12-12 DIAGNOSIS — S32591A Other specified fracture of right pubis, initial encounter for closed fracture: Secondary | ICD-10-CM | POA: Diagnosis present

## 2018-12-12 DIAGNOSIS — K5900 Constipation, unspecified: Secondary | ICD-10-CM | POA: Diagnosis not present

## 2018-12-12 DIAGNOSIS — Z96641 Presence of right artificial hip joint: Secondary | ICD-10-CM | POA: Diagnosis present

## 2018-12-12 DIAGNOSIS — M9701XA Periprosthetic fracture around internal prosthetic right hip joint, initial encounter: Secondary | ICD-10-CM | POA: Diagnosis present

## 2018-12-12 DIAGNOSIS — I10 Essential (primary) hypertension: Secondary | ICD-10-CM | POA: Diagnosis present

## 2018-12-12 DIAGNOSIS — M810 Age-related osteoporosis without current pathological fracture: Secondary | ICD-10-CM | POA: Diagnosis present

## 2018-12-12 DIAGNOSIS — N179 Acute kidney failure, unspecified: Secondary | ICD-10-CM | POA: Diagnosis present

## 2018-12-12 DIAGNOSIS — K219 Gastro-esophageal reflux disease without esophagitis: Secondary | ICD-10-CM | POA: Diagnosis present

## 2018-12-12 DIAGNOSIS — Z882 Allergy status to sulfonamides status: Secondary | ICD-10-CM | POA: Diagnosis not present

## 2018-12-12 DIAGNOSIS — I471 Supraventricular tachycardia: Secondary | ICD-10-CM | POA: Diagnosis present

## 2018-12-12 DIAGNOSIS — Z7982 Long term (current) use of aspirin: Secondary | ICD-10-CM | POA: Diagnosis not present

## 2018-12-12 DIAGNOSIS — D869 Sarcoidosis, unspecified: Secondary | ICD-10-CM | POA: Diagnosis present

## 2018-12-12 DIAGNOSIS — Z20828 Contact with and (suspected) exposure to other viral communicable diseases: Secondary | ICD-10-CM | POA: Diagnosis present

## 2018-12-12 DIAGNOSIS — Z8701 Personal history of pneumonia (recurrent): Secondary | ICD-10-CM | POA: Diagnosis not present

## 2018-12-12 DIAGNOSIS — Z8249 Family history of ischemic heart disease and other diseases of the circulatory system: Secondary | ICD-10-CM | POA: Diagnosis not present

## 2018-12-12 HISTORY — DX: Pathological fracture, other site, initial encounter for fracture: M84.48XA

## 2018-12-12 HISTORY — DX: Fracture of unspecified part of neck of unspecified femur, initial encounter for closed fracture: S72.009A

## 2018-12-12 LAB — CBC
HCT: 37.2 % (ref 36.0–46.0)
Hemoglobin: 11.9 g/dL — ABNORMAL LOW (ref 12.0–15.0)
MCH: 32.3 pg (ref 26.0–34.0)
MCHC: 32 g/dL (ref 30.0–36.0)
MCV: 101.1 fL — ABNORMAL HIGH (ref 80.0–100.0)
Platelets: 163 10*3/uL (ref 150–400)
RBC: 3.68 MIL/uL — ABNORMAL LOW (ref 3.87–5.11)
RDW: 13.7 % (ref 11.5–15.5)
WBC: 9.3 10*3/uL (ref 4.0–10.5)
nRBC: 0 % (ref 0.0–0.2)

## 2018-12-12 LAB — SARS CORONAVIRUS 2 (TAT 6-24 HRS): SARS Coronavirus 2: NEGATIVE

## 2018-12-12 MED ORDER — DILTIAZEM HCL ER COATED BEADS 180 MG PO CP24
180.0000 mg | ORAL_CAPSULE | Freq: Every day | ORAL | Status: DC
  Administered 2018-12-12 – 2018-12-13 (×2): 180 mg via ORAL
  Filled 2018-12-12 (×4): qty 1

## 2018-12-12 MED ORDER — NITROGLYCERIN 0.4 MG SL SUBL: 0.4000 mg | SUBLINGUAL_TABLET | SUBLINGUAL | Status: DC | PRN

## 2018-12-12 MED ORDER — SODIUM CHLORIDE 0.9 % IV SOLN
INTRAVENOUS | Status: DC
  Administered 2018-12-12 – 2018-12-15 (×3): via INTRAVENOUS

## 2018-12-12 MED ORDER — ALBUTEROL SULFATE HFA 108 (90 BASE) MCG/ACT IN AERS
2.0000 | INHALATION_SPRAY | RESPIRATORY_TRACT | Status: DC | PRN
  Filled 2018-12-12: qty 6.7

## 2018-12-12 MED ORDER — ACETAMINOPHEN 500 MG PO TABS: 1000.0000 mg | ORAL_TABLET | Freq: Four times a day (QID) | ORAL | Status: DC | PRN

## 2018-12-12 MED ORDER — IPRATROPIUM BROMIDE 0.03 % NA SOLN: 2.0000 | Freq: Four times a day (QID) | NASAL | Status: DC

## 2018-12-12 MED ORDER — ENOXAPARIN SODIUM 40 MG/0.4ML ~~LOC~~ SOLN
40.0000 mg | SUBCUTANEOUS | Status: DC
  Administered 2018-12-12 – 2018-12-14 (×3): 40 mg via SUBCUTANEOUS
  Filled 2018-12-12 (×3): qty 0.4

## 2018-12-12 MED ORDER — HYDROMORPHONE HCL 1 MG/ML IJ SOLN
0.5000 mg | Freq: Once | INTRAMUSCULAR | Status: AC
  Administered 2018-12-12: 0.5 mg via INTRAVENOUS
  Filled 2018-12-12: qty 1

## 2018-12-12 MED ORDER — FLUTICASONE PROPIONATE 50 MCG/ACT NA SUSP
1.0000 | NASAL | Status: DC | PRN
  Administered 2018-12-14: 11:00:00 1 via NASAL
  Filled 2018-12-12: qty 16

## 2018-12-12 MED ORDER — VITAMIN D (ERGOCALCIFEROL) 1.25 MG (50000 UNIT) PO CAPS: 50000.0000 [IU] | ORAL_CAPSULE | ORAL | Status: DC

## 2018-12-12 MED ORDER — IPRATROPIUM BROMIDE 0.02 % IN SOLN
0.5000 mg | Freq: Four times a day (QID) | RESPIRATORY_TRACT | Status: DC
  Administered 2018-12-12 – 2018-12-13 (×2): 0.5 mg via RESPIRATORY_TRACT
  Filled 2018-12-12 (×2): qty 2.5

## 2018-12-12 MED ORDER — ASPIRIN EC 81 MG PO TBEC
81.0000 mg | DELAYED_RELEASE_TABLET | Freq: Every day | ORAL | Status: DC
  Administered 2018-12-12 – 2018-12-15 (×4): 81 mg via ORAL
  Filled 2018-12-12 (×4): qty 1

## 2018-12-12 MED ORDER — PANTOPRAZOLE SODIUM 40 MG PO TBEC
40.0000 mg | DELAYED_RELEASE_TABLET | Freq: Every day | ORAL | Status: DC
  Administered 2018-12-12 – 2018-12-14 (×3): 40 mg via ORAL
  Filled 2018-12-12 (×3): qty 1

## 2018-12-12 MED ORDER — OXYCODONE-ACETAMINOPHEN 5-325 MG PO TABS
1.0000 | ORAL_TABLET | ORAL | Status: DC | PRN
  Administered 2018-12-12 – 2018-12-13 (×3): 1 via ORAL
  Filled 2018-12-12 (×3): qty 1

## 2018-12-12 MED ORDER — MORPHINE SULFATE (PF) 2 MG/ML IV SOLN
2.0000 mg | INTRAVENOUS | Status: DC | PRN
  Administered 2018-12-13 (×2): 2 mg via INTRAVENOUS
  Filled 2018-12-12 (×2): qty 1

## 2018-12-12 MED ORDER — ONDANSETRON HCL 4 MG/2ML IJ SOLN
4.0000 mg | Freq: Once | INTRAMUSCULAR | Status: AC
  Administered 2018-12-12: 04:00:00 4 mg via INTRAVENOUS
  Filled 2018-12-12: qty 2

## 2018-12-12 MED ORDER — ROSUVASTATIN CALCIUM 5 MG PO TABS
10.0000 mg | ORAL_TABLET | ORAL | Status: DC
  Administered 2018-12-12 – 2018-12-14 (×2): 10 mg via ORAL
  Filled 2018-12-12 (×2): qty 2

## 2018-12-12 MED ORDER — CALCIUM CARBONATE ANTACID 500 MG PO CHEW
1.0000 | CHEWABLE_TABLET | Freq: Every day | ORAL | Status: DC
  Administered 2018-12-13 – 2018-12-15 (×3): 200 mg via ORAL
  Filled 2018-12-12 (×3): qty 1

## 2018-12-12 MED ORDER — MORPHINE SULFATE (PF) 4 MG/ML IV SOLN
4.0000 mg | Freq: Once | INTRAVENOUS | Status: AC
  Administered 2018-12-12: 4 mg via INTRAVENOUS
  Filled 2018-12-12: qty 1

## 2018-12-12 MED ORDER — ONDANSETRON 4 MG PO TBDP
4.0000 mg | ORAL_TABLET | ORAL | Status: DC | PRN
  Administered 2018-12-13 – 2018-12-14 (×2): 4 mg via ORAL
  Filled 2018-12-12 (×3): qty 1

## 2018-12-12 MED ORDER — MORPHINE SULFATE (PF) 4 MG/ML IV SOLN
4.0000 mg | Freq: Once | INTRAVENOUS | Status: AC
  Administered 2018-12-12: 1 mg via INTRAVENOUS
  Filled 2018-12-12: qty 1

## 2018-12-12 NOTE — Discharge Planning (Signed)
Clinical Social Work is seeking post-discharge placement for this patient at the following level of care: SNF.    

## 2018-12-12 NOTE — ED Notes (Signed)
Notes pt spO2 89% on RA, pt responds that she has h/o sarcodoisis. 2L [place] applied - 96%.

## 2018-12-12 NOTE — Progress Notes (Signed)
CSW spoke with patient's daughter about current bed offers for SNF. While speaking to patient's daughter she was also in the room with patient and had CSW spoke with the both of them. At this time, patient and patient's daughter are considering [facility] for placement, but they would like to patient a private room. CSW spoke with Juliann Pulse with [facility] who will look into patient getting a private room and if her insurance will be accepted. Per Juliann Pulse due to patient's dx patient will likely be accepted tomorrow. This CSW to pass off to 2nd shift CSW.   [place], LCSW Transitions of Care Department [facility] ED [phone]

## 2018-12-12 NOTE — ED Provider Notes (Signed)
Patient presents here with complaints of a fall.  She was seen at an outside facility while on vacation after falling and being diagnosed with superior and inferior rami fractures of the right pelvis.  Care was signed out to me by Dr. Wilson Singer.  Patient awaiting consultation with case management and physical therapy in the morning.  I was asked to speak to the patient's daughter as she stated that she was not informed of the results of her mother's tests.  I went to the room to explain these results and discussed the plan.  I discussed the possibility of admission with Dr. Hal Hope, however he felt as though staying the course with the plan for case management and possible placement was more appropriate.  If this falls through, then admission may be the next option.  Patient remained in the ED overnight until case management and physical therapy can evaluate.   Veryl Speak, MD 12/12/18 [phone]

## 2018-12-12 NOTE — Discharge Planning (Signed)
[facility] is reviewing.

## 2018-12-12 NOTE — ED Notes (Signed)
ED provider at bedside.

## 2018-12-12 NOTE — ED Notes (Signed)
Physical therapy at bedside

## 2018-12-12 NOTE — ED Notes (Signed)
Lunch tray ordered 

## 2018-12-12 NOTE — ED Notes (Signed)
Dinner tray ordered.

## 2018-12-12 NOTE — Progress Notes (Signed)
CSW in contact with admissions coordinator, Harriet Pho, at [facility] SN[facility] who confirmed that pt would be discharging to their facility on tomorrow 12/13/2018. EDP and emergency contact aware.  [facility] Transitions of Care  Clinical Social Worker  Ph: [phone]

## 2018-12-12 NOTE — ED Notes (Signed)
Pt O2 sats in the mid to upper 80's. Pt placed on 2L oxygen .  Pt's daughter reports patient has a HX of Sarcoidosis.

## 2018-12-12 NOTE — NC FL2 (Signed)
[place] LEVEL OF CARE SCREENING TOOL     IDENTIFICATION  Patient Name: [name] Birthdate: 1934-08-17 Sex: female Admission Date (Current Location): 12/11/2018  [facility] and [place] Number:  Herbalist and Address:  [facility] [place], [place] [address] [place], [place] [address]      Provider Number: [id number]  Attending Physician Name and Address:  Default, Provider, MD  Relative Name and Phone Number:  [name] (daughter) [phone]    Current Level of Care: Hospital Recommended Level of Care: [place] Prior Approval Number:    Date Approved/Denied:   PASRR Number: [id number]  Discharge Plan: SNF    Current Diagnoses: Patient Active Problem List   Diagnosis Date Noted  . ETD (Eustachian tube dysfunction), bilateral 11/07/2017  . Chronic nonintractable headache 11/07/2017  . Aftercare 06/06/2017  . Closed fracture of right olecranon process 05/09/2017  . Pain in joint of right elbow 05/02/2017  . Rib lesion 04/20/2017  . Bronchiectasis without complication ([place]) [id number]  . Allergic rhinitis 09/15/2015  . CAD (coronary artery disease) 05/18/2015  . Essential hypertension 05/18/2015  . Carotid artery disease ([place]) 05/18/2015  . Cough 03/19/2015  . Left foot pain 09/02/2014  . DOE (dyspnea on exertion) 12/26/2013  . Abnormality of gait 11/06/2013  . Scoliosis (and kyphoscoliosis), idiopathic 07/30/2013  . 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 Constipation 09/05/2012  . History of sinus tachycardia 09/05/2012  . Closed left subtrochanteric femur fracture S/P Open/closed and reduction, internal medullary fixation  09/05/2012  . Acute posthemorrhagic anemia 08/22/2012  . Lumbar radiculopathy 11/23/2011  . Leg weakness 11/23/2011  . Mitral regurgitation 11/22/2011  . DIARRHEA-PRESUMED INFECTIOUS 08/18/2008  . RECTAL BLEEDING 08/18/2008  . PERSONAL HX COLONIC POLYPS 08/18/2008   . DEGENERATIVE JOINT DISEASE 08/14/2008  . SKIN CANCER, HX OF 08/14/2008  . CARPAL TUNNEL SYNDROME, HX OF 08/14/2008  . SARCOIDOSIS, PULMONARY 08/08/2008  . HLD (hyperlipidemia) 08/08/2008  . Paroxysmal supraventricular tachycardia ([place]) 08/08/2008  . GERD 08/08/2008    Orientation RESPIRATION BLADDER Height & Weight     Self, Time, Situation, Place  Normal Incontinent Weight: 125 lb (56.7 kg) Height:  5\' 3"  (160 cm)  BEHAVIORAL SYMPTOMS/MOOD NEUROLOGICAL BOWEL NUTRITION STATUS      Continent Diet(heart healthy)  AMBULATORY STATUS COMMUNICATION OF NEEDS Skin   Extensive Assist Verbally Normal                       Personal Care Assistance Level of Assistance  Bathing, Feeding, Dressing Bathing Assistance: Limited assistance Feeding assistance: Independent Dressing Assistance: Limited assistance     Functional Limitations Info  Sight, Hearing, Speech Sight Info: Adequate Hearing Info: Adequate Speech Info: Adequate    SPECIAL CARE FACTORS FREQUENCY  PT (By licensed PT), OT (By licensed OT)     PT Frequency: 5x weekly OT Frequency: 5x weekly            Contractures Contractures Info: Not present    Additional Factors Info  Code Status, Allergies Code Status Info: Full Allergies Info: Levaquin (Levofloxacin); Sulfonamide Derivatives; Morphine; Oxycodone-acetaminophen; Percocet (Oxycodone-acetaminophen)           Current Medications (12/12/2018):  This is the current hospital active medication list No current facility-administered medications for this encounter.    Current Outpatient Medications  Medication Sig Dispense Refill  . acetaminophen (TYLENOL) 500 MG tablet Take 1,000 mg by mouth every 6 (six) hours as needed for mild pain.     Marland Kitchen  albuterol (PROVENTIL HFA;VENTOLIN HFA) 108 (90 Base) MCG/ACT inhaler Inhale 2 puffs every 4 (four) hours as needed into the lungs for wheezing or shortness of breath. 1 Inhaler 6  . aspirin EC 81 MG tablet Take 1  tablet (81 mg total) by mouth daily.    . calcium carbonate (TUMS - DOSED IN MG ELEMENTAL CALCIUM) 500 MG chewable tablet Chew 1 tablet by mouth daily.     Marland Kitchen CARTIA XT 180 MG 24 hr capsule TAKE 1 CAPSULE(180 MG) BY MOUTH DAILY (Patient taking differently: Take 180 mg by mouth daily. ) 90 capsule 2  . cetirizine (ZYRTEC) 10 MG tablet Take 10 mg by mouth daily.    Marland Kitchen denosumab (PROLIA) 60 MG/ML SOLN injection Inject 60 mg into the skin every 6 (six) months. Administer in upper arm, thigh, or abdomen    . fluticasone (FLONASE) 50 MCG/ACT nasal spray Place into both nostrils as needed for allergies or rhinitis.    Marland Kitchen HYDROcodone-acetaminophen (NORCO/VICODIN) 5-325 MG tablet Take 1 tablet by mouth every 6 (six) hours as needed for moderate pain.     Marland Kitchen ipratropium (ATROVENT) 0.03 % nasal spray Place 2 sprays into both nostrils 4 (four) times daily as needed for rhinitis. 30 mL 11  . nitroGLYCERIN (NITROSTAT) 0.4 MG SL tablet Place 1 tablet (0.4 mg total) under the tongue every 5 (five) minutes as needed for chest pain. 25 tablet 11  . omeprazole (PRILOSEC) 40 MG capsule Take 40 mg by mouth 2 (two) times daily.     . ondansetron (ZOFRAN-ODT) 4 MG disintegrating tablet Take 4 mg by mouth every 4 (four) hours as needed for nausea.     . rosuvastatin (CRESTOR) 10 MG tablet TAKE 1 TABLET 3 TIMES A WEEK AS DIRECTED (DOSE CHANGE) (Patient taking differently: Take 10 mg by mouth every Monday, Wednesday, and Friday. ) 36 tablet 3  . traMADol (ULTRAM) 50 MG tablet Take 50 mg by mouth every 6 (six) hours as needed for severe pain.     . Vitamin D, Ergocalciferol, (DRISDOL) 50000 units CAPS capsule Take 50,000 Units by mouth every Friday.        Discharge Medications: Please see discharge summary for a list of discharge medications.  Relevant Imaging Results:  Relevant Lab Results:   Additional Information SS# [id number]  Janace Hoard, LCSW

## 2018-12-12 NOTE — ED Notes (Signed)
ED TO INPATIENT HANDOFF REPORT  ED Nurse Name and Phone #: Lunette Stands McKittrick Name/Age/Gender [name] 84 y.o. female Room/Bed: 052C/052C  Code Status   Code Status: Full Code  Home/SNF/Other Home Patient oriented to: self, place, time and situation Is this baseline? Yes   Triage Complete: Triage complete  Chief Complaint Fall, R hip pain  Triage Note [place] but hadd to [place] over due to pain. Pt had fallen yesterday and was [facility] at [facility] and told she had right hip fracture. Pt c/o 10/10 pain and given 100 mcg fentanyl PTA by ems. Pain now 2/10.   Allergies Allergies  Allergen Reactions  . Levaquin [Levofloxacin] Other (See Comments)    Leg pain .Marland Kitchen Per doctor not to take again  . Sulfonamide Derivatives     Pt unsure  . Morphine Nausea Only  . Oxycodone-Acetaminophen Other (See Comments)    "talking out of my head"  . Percocet [Oxycodone-Acetaminophen] Nausea And Vomiting    Level of Care/Admitting Diagnosis ED Disposition    ED Disposition Condition [facility] Area: Page [100100]  Level of Care: Telemetry Cardiac [103]  Covid Evaluation: Confirmed COVID Negative  [facility]: [id number]  Admitting Physician: Nita Sells [phone]  Attending Physician: Nita Sells [phone]  Estimated length of stay: 3 - 4 days  Certification:: I certify this patient will need inpatient services for at least 2 midnights  PT Class (Do Not Modify): Inpatient [101]  PT Acc Code (Do Not Modify): Private [1]       B Medical/Surgery History Past Medical History:  Diagnosis Date  . Allergy    SEASONAL  . Arthritis   . Asthma    "slight"   . Baker's cyst of knee, right   . CAD (coronary artery disease)    a. Myoview 10/15 - normal EF 70% // Myoview 11/16: EF 75%, normal perfusion, (there was no blood pressure drop at  low level exercise with Lexiscan infusion), low risk study // c. LHC 3/17 - LAD irregs, oLCx 70 (neg FFR), mRCA 30, EF 55-65% >> med Rx // Myoview 07/2018:  EF 73, extracardiac uptake, no significant ischemia (reviewed with Dr. Burt Knack), Low Risk   . Carotid stenosis    a. Carotid US 9/15 - Bilateral 1-39% ICA >> FU 2 years // b. Bilateral ICA 1-39 >> FU prn // Carotid US 06/2018: bilat 1-39; fu prn  . Dyspnea    due to Sarcoidosis  . Elbow fracture 04/2017   Right, had surgery  . Esophageal reflux   . Hematochezia   . History of echocardiogram    a. Echo 10/15 - EF 60-65%, no RWMA  . HLD (hyperlipidemia)   . Hx of colonoscopy   . Osteoporosis   . Other chronic pulmonary heart diseases   . Paroxysmal supraventricular tachycardia ([place])   . Pneumonia   . Sarcoidosis    Past Surgical History:  Procedure Laterality Date  . arm surgery Right   . BLADDER SURGERY    . CARDIAC CATHETERIZATION N/A 05/20/2015   Procedure: Left Heart Cath and Coronary Angiography;  Surgeon: Sherren Mocha, MD;  Location: [facility];  Service: Cardiovascular;  Laterality: N/A;  . CARPAL TUNNEL RELEASE Left   . COLONOSCOPY    . FOOT SURGERY Right   . HIP SURGERY Right 2011   full replacement  . LEG  SURGERY Left May 2014   femur fracture s/p open and closed reduction in [place], Dr. Jimmye Norman  . ORIF ELBOW FRACTURE Right 05/09/2017   Procedure: ORIF right olecranon fracture with repair/reconstruction, ulnar nerve transposition as needed;  Surgeon: Roseanne Kaufman, MD;  Location: [facility];  Service: Orthopedics;  Laterality: Right;  Requests 90 mins  . POLYPECTOMY    . SHOULDER ARTHROSCOPY W/ ROTATOR CUFF REPAIR Right   . TRAPEZIUM RESECTION Right      A IV Location/Drains/Wounds Patient Lines/Drains/Airways Status   Active Line/Drains/Airways    Name:   Placement date:   Placement time:   Site:   Days:   Peripheral IV 12/11/18 Left Antecubital   12/11/18    2124    Antecubital   1   Peripheral IV  12/12/18 Left Forearm   12/12/18    1946    Forearm   less than 1   Incision (Closed) 05/09/17 Arm Right   05/09/17    1827     582          Intake/Output Last 24 hours No intake or output data in the 24 hours ending 12/12/18 1953  Labs/Imaging Results for orders placed or performed during the hospital encounter of 12/11/18 (from the past 48 hour(s))  CBC with Differential     Status: Abnormal   Collection Time: 12/11/18  8:25 PM  Result Value Ref Range   WBC 9.9 4.0 - 10.5 K/uL   RBC 3.64 (L) 3.87 - 5.11 MIL/uL   Hemoglobin 11.9 (L) 12.0 - 15.0 g/dL   HCT 36.5 36.0 - 46.0 %   MCV 100.3 (H) 80.0 - 100.0 fL   MCH 32.7 26.0 - 34.0 pg   MCHC 32.6 30.0 - 36.0 g/dL   RDW 13.8 11.5 - 15.5 %   Platelets 174 150 - 400 K/uL   nRBC 0.0 0.0 - 0.2 %   Neutrophils Relative % 79 %   Neutro Abs 7.8 (H) 1.7 - 7.7 K/uL   Lymphocytes Relative 10 %   Lymphs Abs 0.9 0.7 - 4.0 K/uL   Monocytes Relative 8 %   Monocytes Absolute 0.8 0.1 - 1.0 K/uL   Eosinophils Relative 1 %   Eosinophils Absolute 0.1 0.0 - 0.5 K/uL   Basophils Relative 0 %   Basophils Absolute 0.0 0.0 - 0.1 K/uL   Immature Granulocytes 2 %   Abs Immature Granulocytes 0.18 (H) 0.00 - 0.07 K/uL    Comment: Performed at [facility], [address] [place], [place] [id number]  Basic metabolic panel     Status: Abnormal   Collection Time: 12/11/18  8:25 PM  Result Value Ref Range   Sodium 136 135 - 145 mmol/L   Potassium 3.6 3.5 - 5.1 mmol/L   Chloride 103 98 - 111 mmol/L   CO2 24 22 - 32 mmol/L   Glucose, Bld 97 70 - 99 mg/dL   BUN 26 (H) 8 - 23 mg/dL   Creatinine, Ser 0.79 0.44 - 1.00 mg/dL   Calcium 8.3 (L) 8.9 - 10.3 mg/dL   GFR calc non Af Amer >60 >60 mL/min   GFR calc Af Amer >60 >60 mL/min   Anion gap 9 5 - 15    Comment: Performed at [facility] [address], [place], [place] [address]  SARS CORONAVIRUS 2 (TAT 6-24 HRS) Nasopharyngeal Nasopharyngeal Swab     Status: None   Collection Time: 12/11/18   8:27 PM   Specimen: Nasopharyngeal Swab  Result Value Ref Range   SARS Coronavirus 2 NEGATIVE NEGATIVE    Comment: (NOTE) SARS-CoV-2 target nucleic acids are NOT DETECTED. The SARS-CoV-2 RNA is generally detectable in upper and lower respiratory specimens during the acute phase of infection. Negative results do not preclude SARS-CoV-2 infection, do not rule out co-infections with other pathogens, and should not be used as the sole basis for treatment or other patient management decisions. Negative results must be combined with clinical observations, patient history, and epidemiological information. The expected result is Negative. Fact Sheet for Patients: SugarRoll.be Fact Sheet for Healthcare Providers: https://www.woods-mathews.com/ This test is not yet approved or cleared by the [place] FDA and  has been authorized for detection and/or diagnosis of SARS-CoV-2 by FDA under an Emergency Use Authorization (EUA). This EUA will remain  in effect (meaning this test can be used) for the duration of the COVID-19 declaration under Section 56 4(b)(1) of the Act, 21 U.S.C. section 360bbb-3(b)(1), unless the authorization is terminated or revoked sooner. Performed at [facility], [place] [address] [place], [place] [address]   CBC     Status: Abnormal   Collection Time: 12/12/18  7:00 PM  Result Value Ref Range   WBC 9.3 4.0 - 10.5 K/uL   RBC 3.68 (L) 3.87 - 5.11 MIL/uL   Hemoglobin 11.9 (L) 12.0 - 15.0 g/dL   HCT 37.2 36.0 - 46.0 %   MCV 101.1 (H) 80.0 - 100.0 fL   MCH 32.3 26.0 - 34.0 pg   MCHC 32.0 30.0 - 36.0 g/dL   RDW 13.7 11.5 - 15.5 %   Platelets 163 150 - 400 K/uL   nRBC 0.0 0.0 - 0.2 %    Comment: Performed at [facility], [place] [address], [place], [place] [address]   Ct Hip Right Wo Contrast  Result Date: 12/11/2018 CLINICAL DATA:  Hip pain due to trauma. EXAM: CT OF THE RIGHT HIP WITHOUT CONTRAST TECHNIQUE:  Multidetector CT imaging of the right hip was performed according to the standard protocol. Multiplanar CT image reconstructions were also generated. COMPARISON:  Radiographs dated 04/29/2017 FINDINGS: Bones/Joint/Cartilage There is a displaced fracture of the right superior pubic ramus at the junction with the acetabulum extending into the anterior inferior rim of the acetabulum. There is a slightly displaced fracture of the right inferior pubic ramus. There is an impacted vertical fracture of the right sacral ala, incompletely visualized. The right total hip prosthesis appears in good position with no evidence of loosening or fracture of the proximal femur. Muscles and Tendons No acute abnormality. Soft tissues Edema in the subcutaneous fat at the lateral aspect of the right hip consistent with soft tissue contusion. IMPRESSION: 1. Fractures of the right superior and inferior pubic rami as described. 2. Impacted vertical fracture of the right sacral ala, incompletely visualized. 3. Soft tissue contusion at the lateral aspect of the right hip. Electronically Signed   By: Lorriane Shire M.D.   On: 12/11/2018 20:37    Pending Labs Unresulted Labs (From admission, onward)    Start     Ordered   12/13/18 0500  Comprehensive metabolic panel  Tomorrow morning,   R     12/12/18 1744   12/13/18 0500  CBC  Tomorrow morning,   R     12/12/18 1744          Vitals/Pain Today's Vitals   12/12/18 1744 12/12/18 1800 12/12/18 1830 12/12/18 1833  BP:  (!) 120/46 (!) 130/45   Pulse:  73 73 74   Resp: 15 15 16    Temp:      TempSrc:      SpO2: 96% 96% 97%   Weight:      Height:      PainSc:    9     Isolation Precautions No active isolations  Medications Medications  oxyCODONE-acetaminophen (PERCOCET/ROXICET) 5-325 MG per tablet 1 tablet (1 tablet Oral Given 12/12/18 1757)  morphine 2 MG/ML injection 2 mg (has no administration in time range)  acetaminophen (TYLENOL) tablet 1,000 mg (has no  administration in time range)  aspirin EC tablet 81 mg (81 mg Oral Given 12/12/18 1811)  diltiazem (CARDIZEM CD) 24 hr capsule 180 mg (has no administration in time range)  nitroGLYCERIN (NITROSTAT) SL tablet 0.4 mg (has no administration in time range)  rosuvastatin (CRESTOR) tablet 10 mg (10 mg Oral Given 12/12/18 1812)  calcium carbonate (TUMS - dosed in mg elemental calcium) chewable tablet 200 mg of elemental calcium (has no administration in time range)  pantoprazole (PROTONIX) EC tablet 40 mg (40 mg Oral Given 12/12/18 1812)  ondansetron (ZOFRAN-ODT) disintegrating tablet 4 mg (has no administration in time range)  Vitamin D (Ergocalciferol) (DRISDOL) capsule 50,000 Units (has no administration in time range)  albuterol (VENTOLIN HFA) 108 (90 Base) MCG/ACT inhaler 2 puff (has no administration in time range)  fluticasone (FLONASE) 50 MCG/ACT nasal spray 1 spray (has no administration in time range)  enoxaparin (LOVENOX) injection 40 mg (has no administration in time range)  0.9 %  sodium chloride infusion ( Intravenous New Bag/Given 12/12/18 1902)  ipratropium (ATROVENT) nebulizer solution 0.5 mg (0.5 mg Nebulization Given 12/12/18 1815)  HYDROmorphone (DILAUDID) injection 0.5 mg (0.5 mg Intravenous Given 12/11/18 1957)  LORazepam (ATIVAN) injection 0.5 mg (0.5 mg Intravenous Given 12/11/18 1957)  ibuprofen (ADVIL) tablet 400 mg (400 mg Oral Given 12/11/18 1957)  morphine 4 MG/ML injection 4 mg (1 mg Intravenous Given 12/12/18 0336)  ondansetron (ZOFRAN) injection 4 mg (4 mg Intravenous Given 12/12/18 0336)  morphine 4 MG/ML injection 4 mg (4 mg Intravenous Given 12/12/18 1406)  HYDROmorphone (DILAUDID) injection 0.5 mg (0.5 mg Intravenous Given 12/12/18 1703)    Mobility non-ambulatory High fall risk   Focused Assessments Musculoskeletal    R Recommendations: See Admitting Provider Note  Report given to:   Additional Notes: N/A

## 2018-12-12 NOTE — Evaluation (Signed)
Physical Therapy Evaluation Patient Details Name: [name] MRN: [id number] DOB: 1935/02/20 Today's Date: 12/12/2018   History of Present Illness  Patient is an 84 year old female admitted after falling. Sustained a R inferior and superior pubic rami fractures.  Clinical Impression  Patient received in bed in ED. Reports pain with movement. Otherwise pleasant and agrees to PT evaluation. Patient requires mod assistance with all bed mobility due to pain. She is able to scoot forward off edge of bed to standing with min guard. Once standing patient has difficulty weight shifting onto right LE. She is able to take a few side steps up along edge of bed. Patient will continue to benefit from skilled PT while here to improve functional mobility and strength.       Follow Up Recommendations SNF;Supervision/Assistance - 24 hour    Equipment Recommendations  None recommended by PT    Recommendations for Other Services       Precautions / Restrictions Precautions Precautions: Fall Restrictions Weight Bearing Restrictions: Yes RLE Weight Bearing: Weight bearing as tolerated      Mobility  Bed Mobility Overal bed mobility: Needs Assistance Bed Mobility: Supine to Sit;Sit to Supine     Supine to sit: Mod assist Sit to supine: Mod assist   General bed mobility comments: requires assistance to bring LEs on and off bed due to pain. Requires assist to raise trunk to seated position. Yelling out in pain during mobility.  Transfers Overall transfer level: Needs assistance Equipment used: Rolling walker (2 wheeled) Transfers: Sit to/from Stand Sit to Stand: Min assist            Ambulation/Gait Ambulation/Gait assistance: Min Web designer (Feet): 3 Feet Assistive device: Rolling walker (2 wheeled) Gait Pattern/deviations: Step-to pattern;Antalgic;Decreased stride length;Decreased step length - right;Decreased step length - left Gait velocity: decreased   General  Gait Details: painful weight bearing on right.  Stairs            Wheelchair Mobility    Modified Rankin (Stroke Patients Only)       Balance Overall balance assessment: Needs assistance Sitting-balance support: Feet supported Sitting balance-Leahy Scale: Good     Standing balance support: Bilateral upper extremity supported Standing balance-Leahy Scale: Fair                               Pertinent Vitals/Pain Pain Assessment: 0-10 Pain Score: 9  Pain Location: R LE Pain Descriptors / Indicators: Aching;Grimacing;Guarding;Sore;Discomfort;Moaning Pain Intervention(s): Monitored during session;Limited activity within patient's tolerance;Repositioned    Home Living Family/patient expects to be discharged to:: Skilled nursing facility                 Additional Comments: lives with daughter and son in [place]. Reports they are unable to help her.    Prior Function Level of Independence: Independent               Hand Dominance   Dominant Hand: Right    Extremity/Trunk Assessment   Upper Extremity Assessment Upper Extremity Assessment: Overall WFL for tasks assessed    Lower Extremity Assessment Lower Extremity Assessment: RLE deficits/detail RLE Deficits / Details: painful with mobility on Right RLE: Unable to fully assess due to pain RLE Sensation: WNL RLE Coordination: decreased gross motor    Cervical / Trunk Assessment Cervical / Trunk Assessment: Normal  Communication   Communication: No difficulties  Cognition Arousal/Alertness: Awake/alert Behavior During Therapy: [facility] for  tasks assessed/performed Overall Cognitive Status: Within Functional Limits for tasks assessed                                        General Comments      Exercises     Assessment/Plan    PT Assessment Patient needs continued PT services  PT Problem List Decreased activity tolerance;Pain;Decreased mobility;Decreased range of  motion;Decreased strength       PT Treatment Interventions DME instruction;Gait training;Therapeutic activities;Therapeutic exercise;Functional mobility training;Patient/family education;Stair training    PT Goals (Current goals can be found in the Care Plan section)  Acute Rehab PT Goals Patient Stated Goal: to go to reahb to get some help PT Goal Formulation: With patient Time For Goal Achievement: 12/19/18 Potential to Achieve Goals: Good    Frequency Min 3X/week   Barriers to discharge Decreased caregiver support      Co-evaluation               AM-PAC PT "6 Clicks" Mobility  Outcome Measure Help needed turning from your back to your side while in a flat bed without using bedrails?: A Lot Help needed moving from lying on your back to sitting on the side of a flat bed without using bedrails?: A Lot Help needed moving to and from a bed to a chair (including a wheelchair)?: A Lot Help needed standing up from a chair using your arms (e.g., wheelchair or bedside chair)?: A Lot Help needed to walk in hospital room?: A Lot Help needed climbing 3-5 steps with a railing? : A Lot 6 Click Score: 12    End of Session Equipment Utilized During Treatment: Gait belt Activity Tolerance: Patient limited by pain Patient left: in bed;with call bell/phone within reach Nurse Communication: Mobility status PT Visit Diagnosis: History of falling (Z91.81);Pain;Difficulty in walking, not elsewhere classified (R26.2);Other abnormalities of gait and mobility (R26.89) Pain - Right/Left: Right    Time: [id number] PT Time Calculation (min) (ACUTE ONLY): 30 min   Charges:   PT Evaluation $PT Eval Moderate Complexity: 1 Mod PT Treatments $Therapeutic Activity: 8-22 mins        Jaana Brodt, PT, GCS 12/12/18,10:15 AM

## 2018-12-12 NOTE — ED Provider Notes (Signed)
  Physical Exam  BP (!) 108/40   Pulse 86   Temp 98.6 F (37 C) (Oral)   Resp 12   Ht 5\' 3"  (1.6 m)   Wt 56.7 kg   LMP 02/22/1983 (Approximate)   SpO2 95%   BMI 22.14 kg/m   Physical Exam  ED Course/Procedures     Procedures  MDM   Patient having persistent moderate to severe pain.  She is not comfortable going to a SNF because of her severe pain.  Social worker informs me that it will be pretty difficult to place her today given that is already after 4 PM.  We will call medicine for admission.     Varney Biles, MD 12/12/18 1735

## 2018-12-12 NOTE — H&P (Signed)
History and Physical  [name] [id number] DOB: 04/29/1934 DOA: 12/11/2018  PCP: [name], MD   Chief Complaint: Fall and pain  HPI:  39 white female known history of sarcoid, CAD with known slightly abnormal LHC 2017 HTN allergies prior fractures, reflux, carotid stenosis SVT and multiple other chronic issues [place]-states no presyncope, - blurred vision, - double vision, + strokelike symptoms - seizure - unilateral weakness - chest pain - S OB - DOE out of proportion At baseline very coherent-able to do majority of ADLs IADLs-daughter has noticed over the past month has been bumping into walls however no new changes to medications  Chart review: . Followed by Dr. [name] pulmonary for bronchiectasis reflux allergic rhinitis on meds . Last nuclear stress 73% 08/02/2018 EF 65% and low risk study, last carotids 5 1821 to 39% . Right hip replacement 2010, right olecranon fracture status post repair 2019   ED Course: Given pain meds, labs obtained, social work consulted  Imaging revealed in the ED sacral insufficiency fractures, Ortho consulted felt nonoperative management-social worker consulted attempted to place patient from the ED however we attempted precluding in addition to pain level being intense to the 9-10 range and need for inpatient management  Review of Systems:   Negative for fever, visual changes, sore throat, rash, new muscle aches, chest pain, SOB, dysuria, bleeding, n/v/abdominal pain.  Past Medical History:  Diagnosis Date  . Allergy    SEASONAL  . Arthritis   . Asthma    "slight"   . Baker's cyst of knee, right   . CAD (coronary artery disease)    a. Myoview 10/15 - normal EF 70% // Myoview 11/16: EF 75%, normal perfusion, (there was no blood pressure drop at low level exercise with Lexiscan infusion), low risk study // c. LHC 3/17 - LAD irregs, oLCx 70 (neg FFR),  mRCA 30, EF 55-65% >> med Rx // Myoview 07/2018:  EF 73, extracardiac uptake, no significant ischemia (reviewed with Dr. Burt Knack), Low Risk   . Carotid stenosis    a. Carotid US 9/15 - Bilateral 1-39% ICA >> FU 2 years // b. Bilateral ICA 1-39 >> FU prn // Carotid US 06/2018: bilat 1-39; fu prn  . Dyspnea    due to Sarcoidosis  . Elbow fracture 04/2017   Right, had surgery  . Esophageal reflux   . Hematochezia   . History of echocardiogram    a. Echo 10/15 - EF 60-65%, no RWMA  . HLD (hyperlipidemia)   . Hx of colonoscopy   . Osteoporosis   . Other chronic pulmonary heart diseases   . Paroxysmal supraventricular tachycardia ([place])   . Pneumonia   . Sarcoidosis     Past Surgical History:  Procedure Laterality Date  . arm surgery Right   . BLADDER SURGERY    . CARDIAC CATHETERIZATION N/A 05/20/2015   Procedure: Left Heart Cath and Coronary Angiography;  Surgeon: Sherren Mocha, MD;  Location: [facility];  Service: Cardiovascular;  Laterality: N/A;  . CARPAL TUNNEL RELEASE Left   . COLONOSCOPY    . FOOT SURGERY Right   . HIP SURGERY Right 2011   full replacement  . LEG SURGERY Left May 2014   femur fracture s/p open and closed reduction in [place], Dr. Jimmye Norman  . ORIF ELBOW FRACTURE Right 05/09/2017   Procedure: ORIF right olecranon fracture with repair/reconstruction, ulnar nerve transposition as needed;  Surgeon: Amedeo Plenty,  Gwyndolyn Saxon, MD;  Location: [facility];  Service: Orthopedics;  Laterality: Right;  Requests 90 mins  . POLYPECTOMY    . SHOULDER ARTHROSCOPY W/ ROTATOR CUFF REPAIR Right   . TRAPEZIUM RESECTION Right      reports that she has never smoked. She has never used smokeless tobacco. She reports that she does not drink alcohol or use drugs. Mobility: Independent at baseline  Allergies  Allergen Reactions  . Levaquin [Levofloxacin] Other (See Comments)    Leg pain .Marland Kitchen Per doctor not to take again  . Sulfonamide Derivatives     Pt unsure  . Morphine Nausea Only  .  Oxycodone-Acetaminophen Other (See Comments)    "talking out of my head"  . Percocet [Oxycodone-Acetaminophen] Nausea And Vomiting    Family History  Problem Relation Age of Onset  . Colon cancer Mother 39  . Cancer Mother   . Lung cancer Father   . Heart disease Father   . Arrhythmia Father   . Cancer Father   . Cancer Sister        ovarian  . Heart attack Brother   . Esophageal cancer Neg Hx   . Rectal cancer Neg Hx   . Stomach cancer Neg Hx   . Stroke Neg Hx      Prior to Admission medications   Medication Sig Start Date End Date Taking? Authorizing Provider  acetaminophen (TYLENOL) 500 MG tablet Take 1,000 mg by mouth every 6 (six) hours as needed for mild pain.    Yes [provider]  albuterol (PROVENTIL HFA;VENTOLIN HFA) 108 (90 Base) MCG/ACT inhaler Inhale 2 puffs every 4 (four) hours as needed into the lungs for wheezing or shortness of breath. 01/03/17  Yes Collene Gobble, MD  aspirin EC 81 MG tablet Take 1 tablet (81 mg total) by mouth daily. 12/23/14  Yes [facility], [name] T, PA-C  calcium carbonate (TUMS - DOSED IN MG ELEMENTAL CALCIUM) 500 MG chewable tablet Chew 1 tablet by mouth daily.    Yes [provider]  CARTIA XT 180 MG 24 hr capsule TAKE 1 CAPSULE(180 MG) BY MOUTH DAILY Patient taking differently: Take 180 mg by mouth daily.  08/02/16  Yes [facility], [name] T, PA-C  cetirizine (ZYRTEC) 10 MG tablet Take 10 mg by mouth daily.   Yes [provider]  denosumab (PROLIA) 60 MG/ML SOLN injection Inject 60 mg into the skin every 6 (six) months. Administer in upper arm, thigh, or abdomen   Yes [provider]  fluticasone (FLONASE) 50 MCG/ACT nasal spray Place into both nostrils as needed for allergies or rhinitis.   Yes [provider]  HYDROcodone-acetaminophen (NORCO/VICODIN) 5-325 MG tablet Take 1 tablet by mouth every 6 (six) hours as needed for moderate pain.  12/10/18  Yes [provider]  ipratropium (ATROVENT)  0.03 % nasal spray Place 2 sprays into both nostrils 4 (four) times daily as needed for rhinitis. 11/07/18  Yes Collene Gobble, MD  nitroGLYCERIN (NITROSTAT) 0.4 MG SL tablet Place 1 tablet (0.4 mg total) under the tongue every 5 (five) minutes as needed for chest pain. 07/11/17  Yes [facility], [name] T, PA-C  omeprazole (PRILOSEC) 40 MG capsule Take 40 mg by mouth 2 (two) times daily.  11/03/13  Yes [provider]  ondansetron (ZOFRAN-ODT) 4 MG disintegrating tablet Take 4 mg by mouth every 4 (four) hours as needed for nausea.  12/10/18  Yes [provider]  rosuvastatin (CRESTOR) 10 MG tablet TAKE 1 TABLET 3 TIMES A  WEEK AS DIRECTED (DOSE CHANGE) Patient taking differently: Take 10 mg by mouth every Monday, Wednesday, and Friday.  08/27/18  Yes [facility], [name] T, PA-C  traMADol (ULTRAM) 50 MG tablet Take 50 mg by mouth every 6 (six) hours as needed for severe pain.  11/22/13  Yes [provider]  Vitamin D, Ergocalciferol, (DRISDOL) 50000 units CAPS capsule Take 50,000 Units by mouth every Friday.    Yes [provider]    Physical Exam: Vitals:   12/12/18 1530 12/12/18 1600  BP: (!) 123/54 92/65  Pulse: 81   Resp: (!) 22 20  Temp:    SpO2: 95%       I have personally reviewed following labs and imaging studies  Labs:  Flu: None RVP: No CBC: leukopenia, lymphopenia no BMP: increased BUN/Cr slightly increased compared to baseline 26/0.7 LFTs: increased AST/ALT/Tbili no CRP: increased no LDH: increased no Procalcitonin: low no CXR: hazy bilateral peripheral opacities no    CT chest: GGO, consolidation, crazy paving note  Imaging studies:   CT hip showed fractures right superior and inferior pubic rami with impacted vertebral fracture of right sacral ala incompletely visualized and contusion over right hip  Medical tests:   EKG independently reviewed: Yes-none performed  Principal Problem:   Sacral insufficiency fracture Active Problems:    PERSONAL HX COLONIC POLYPS   DOE (dyspnea on exertion)   CAD (coronary artery disease)   Essential hypertension   Bronchiectasis without complication (HCC)   Assessment/Plan Sacral insufficiency fractures Patient is stable at this time however 9/10 pain so we will give Percocet (has tolerated IV morphine without nausea) as this will last longer and place on IV morphine low-dose every 3 as needed for severe pain Social worker already aware-we will be attempting to place patient in the next 24 to 48 hours Therapy services have recommended skilled placement Mild AKI Placed on fluid 75 cc/h monitor trends, repeat labs in a.m. Bronchiectasis without complication- Not requiring oxygen at this time monitor trends- CAD nonobstructive disease last cath as above Stable at this time without any issues-continue aspirin for secondary prevention Paroxysmal SVT resume Cartia 180 XL-monitor trends of electrolytes in a.m. Reflux Continue Protonix 40 daily calcium carbonate Seasonal allergies Continue albuterol inhalers in addition to other inhalers    Severity of Illness: The appropriate patient status for this patient is INPATIENT. Inpatient status is judged to be reasonable and necessary in order to provide the required intensity of service to ensure the patient's safety. The patient's presenting symptoms, physical exam findings, and initial radiographic and laboratory data in the context of their chronic comorbidities is felt to place them at high risk for further clinical deterioration. Furthermore, it is not anticipated that the patient will be medically stable for discharge from the hospital within 2 midnights of admission. The following factors support the patient status of inpatient.   " The patient's presenting symptoms include pain. " The worrisome physical exam findings include sacral insufficiency fractures. " The initial radiographic and laboratory data are worrisome because of  uncontrolled pain. " The chronic co-morbidities include advanced age.   * I certify that at the point of admission it is my clinical judgment that the patient will require inpatient hospital care spanning beyond 2 midnights from the point of admission due to high intensity of service, high risk for further deterioration and high frequency of surveillance required.*     DVT prophylaxis: Lovenox Code Status: Full Family Communication: Discussed with daughter Consults called: Ortho consulted  Time spent: 50 minutes  Verneita Griffes, MD [facility] 5:01 PM  12/12/2018, 5:01 PM

## 2018-12-12 NOTE — Progress Notes (Addendum)
CSW spoke with patient's daughter via phone to provide her with an update as well as discuss SNF placement. Of note, patient's daughter spoke with 2nd shift ED CSW and 2nd shift RN CM about the 3 night qualifying stay needed for admission. CSW explained that some facilities may wave this and explained the process. Patient's daughter gave permission for CSW to fax patient's information to being finding placement. Patient's daughter reports she was given the [facility] Choice list by the 2nd shift ED CSW. Patient's daughter reports she would like her mother to be admitted into [facility] or [facility]. CSW to reach out to the facilities, but also explained that due to patient being in the ED we may have to go with the first facility who will accept patient and her insurance. Patient's daughter understood. CSW to continue to follow up.   [place], LCSW Transitions of Care Department [facility] ED [phone]

## 2018-12-13 LAB — COMPREHENSIVE METABOLIC PANEL
ALT: 13 U/L (ref 0–44)
AST: 16 U/L (ref 15–41)
Albumin: 2.6 g/dL — ABNORMAL LOW (ref 3.5–5.0)
Alkaline Phosphatase: 41 U/L (ref 38–126)
Anion gap: 8 (ref 5–15)
BUN: 17 mg/dL (ref 8–23)
CO2: 23 mmol/L (ref 22–32)
Calcium: 7.5 mg/dL — ABNORMAL LOW (ref 8.9–10.3)
Chloride: 107 mmol/L (ref 98–111)
Creatinine, Ser: 0.71 mg/dL (ref 0.44–1.00)
GFR calc Af Amer: >60 mL/min (ref >60)
GFR calc non Af Amer: >60 mL/min (ref >60)
Glucose, Bld: 94 mg/dL (ref 70–99)
Potassium: 3.9 mmol/L (ref 3.5–5.1)
Sodium: 138 mmol/L (ref 135–145)
Total Bilirubin: 0.5 mg/dL (ref 0.3–1.2)
Total Protein: 4.5 g/dL — ABNORMAL LOW (ref 6.5–8.1)

## 2018-12-13 LAB — CBC
HCT: 32.9 % — ABNORMAL LOW (ref 36.0–46.0)
Hemoglobin: 10.7 g/dL — ABNORMAL LOW (ref 12.0–15.0)
MCH: 32.3 pg (ref 26.0–34.0)
MCHC: 32.5 g/dL (ref 30.0–36.0)
MCV: 99.4 fL (ref 80.0–100.0)
Platelets: 156 10*3/uL (ref 150–400)
RBC: 3.31 MIL/uL — ABNORMAL LOW (ref 3.87–5.11)
RDW: 13.7 % (ref 11.5–15.5)
WBC: 8.2 10*3/uL (ref 4.0–10.5)
nRBC: 0 % (ref 0.0–0.2)

## 2018-12-13 LAB — SARS CORONAVIRUS 2 (TAT 6-24 HRS): SARS Coronavirus 2: NEGATIVE

## 2018-12-13 MED ORDER — IPRATROPIUM-ALBUTEROL 0.5-2.5 (3) MG/3ML IN SOLN: 3.0000 mL | Freq: Four times a day (QID) | RESPIRATORY_TRACT | Status: DC | PRN

## 2018-12-13 MED ORDER — SENNOSIDES-DOCUSATE SODIUM 8.6-50 MG PO TABS
1.0000 | ORAL_TABLET | Freq: Every day | ORAL | Status: DC
  Administered 2018-12-13 – 2018-12-14 (×2): 1 via ORAL
  Filled 2018-12-13 (×2): qty 1

## 2018-12-13 MED ORDER — IBUPROFEN 200 MG PO TABS
600.0000 mg | ORAL_TABLET | Freq: Three times a day (TID) | ORAL | Status: DC
  Administered 2018-12-13 (×3): 600 mg via ORAL
  Filled 2018-12-13 (×4): qty 3

## 2018-12-13 MED ORDER — OXYCODONE-ACETAMINOPHEN 5-325 MG PO TABS
1.0000 | ORAL_TABLET | ORAL | Status: DC | PRN
  Administered 2018-12-13 – 2018-12-14 (×4): 2 via ORAL
  Filled 2018-12-13 (×5): qty 2

## 2018-12-13 MED ORDER — POLYETHYLENE GLYCOL 3350 17 G PO PACK
17.0000 g | PACK | Freq: Every day | ORAL | Status: DC
  Administered 2018-12-13 – 2018-12-15 (×3): 17 g via ORAL
  Filled 2018-12-13 (×2): qty 1

## 2018-12-13 MED ORDER — MORPHINE SULFATE (PF) 2 MG/ML IV SOLN
2.0000 mg | INTRAVENOUS | Status: AC | PRN
  Administered 2018-12-13 – 2018-12-14 (×2): 2 mg via INTRAVENOUS
  Filled 2018-12-13 (×2): qty 1

## 2018-12-13 NOTE — Progress Notes (Signed)
Hospitalist progress note  [name]  [id number] DOB: 1935-02-05 DOA: 12/11/2018 PCP: [name], MD  Narrative:  84 year old high functioning community dwelling female with bronchiectasis sarcoid CAD abnormal heart cath 2017 HTN allergies right hip replacement right olecranon fracture admitted with accidental fall and found to have sacral fractures Assessment & Plan: Sacral fractures Pain uncontrolled despite medications so increasing Percocet from 1-2 every 4 as needed, addition of ibuprofen and monitor trends-not ready for discharge today keep morphine on board in case severe pain Mild AKI Improved to some degree continuing saline today monitor trends repeat labs in a.m. Bronchiectasis, asthma, sarcoid All currently stable at this time Paroxysmal SVT Continue Cartia XL 190   DVT prophylaxis: Lovenox  Code Status:   Full   Family Communication:   None at bedside today Disposition Plan: Inpatient pending resolution of pain  Consultants:   Orthopedics consulted by ED signed off Procedures:   None Antimicrobials:   No Subjective: Awake alert coherent no distress although slightly tearful with movement and feels pain is unmanaged Objective: Vitals:   12/12/18 2015 12/12/18 2110 12/13/18 0441 12/13/18 0840  BP: (!) 100/39 (!) 105/50 (!) 93/37   Pulse: 78 75 70   Resp: 18 18 18    Temp:  98.7 F (37.1 C) 98.9 F (37.2 C)   TempSrc:  Oral Oral   SpO2: 98% 97% 92% 90%  Weight:      Height:        Intake/Output Summary (Last 24 hours) at 12/13/2018 1039 Last data filed at 12/13/2018 0500 Gross per 24 hour  Intake 240 ml  Output 500 ml  Net -260 ml   Filed Weights   12/11/18 1938  Weight: 56.7 kg    Examination: EOMI NCAT frail Caucasian female no distress no icterus no pallor neck soft supple Chest clinically clear no added sound S1-S2 no murmur rub or gallop Unable to lift right leg Pulses intact Power 5/5 except for right lower  extremity  Data Reviewed: I have personally reviewed following labs and imaging studies CBC: Recent Labs  Lab 12/11/18 2025 12/12/18 1900 12/13/18 0351  WBC 9.9 9.3 8.2  NEUTROABS 7.8*  --   --   HGB 11.9* 11.9* 10.7*  HCT 36.5 37.2 32.9*  MCV 100.3* 101.1* 99.4  PLT 174 163 [id number]   Basic Metabolic Panel: Recent Labs  Lab 12/11/18 2025 12/13/18 0351  NA 136 138  K 3.6 3.9  CL 103 107  CO2 24 23  GLUCOSE 97 94  BUN 26* 17  CREATININE 0.79 0.71  CALCIUM 8.3* 7.5*   GFR: Estimated Creatinine Clearance: 44.8 mL/min (by C-G formula based on SCr of 0.71 mg/dL). Liver Function Tests: Recent Labs  Lab 12/13/18 0351  AST 16  ALT 13  ALKPHOS 41  BILITOT 0.5  PROT 4.5*  ALBUMIN 2.6*   No results for input(s): LIPASE, AMYLASE in the last 168 hours. No results for input(s): AMMONIA in the last 168 hours. Coagulation Profile: No results for input(s): INR, PROTIME in the last 168 hours. Cardiac Enzymes: Radiology Studies: Reviewed images personally in health database  Scheduled Meds: . aspirin EC  81 mg Oral Daily  . calcium carbonate  1 tablet Oral Daily  . diltiazem  180 mg Oral Daily  . enoxaparin (LOVENOX) injection  40 mg Subcutaneous Q24H  . ibuprofen  600 mg Oral TID  . ipratropium  0.5 mg Nebulization QID  . pantoprazole  40 mg Oral Daily  . rosuvastatin  10 mg Oral  Q M,W,F  . [START ON 12/17/2018] Vitamin D (Ergocalciferol)  50,000 Units Oral Q Fri   Continuous Infusions: . sodium chloride 75 mL/hr at 12/12/18 1902    LOS: 1 day   Time spent: [place], MD [facility]  If 7PM-7AM, please contact night-coverage-look on AMION to find my number otherwise-prefer pages-not epic chat,please 12/13/2018, 10:39 AM

## 2018-12-13 NOTE — Progress Notes (Signed)
Physical Therapy Treatment Patient Details Name: [name] MRN: [id number] DOB: 03-Dec-1934 Today's Date: 12/13/2018    History of Present Illness Pt is an 84 y/o female admitted after a mechanical fall in which she sustained a R inferior and superior pubic rami fx. Plan is for non-operative management. PMH including but not limited to     PT Comments    Pt remains greatly limited with mobility secondary to R hip pain. Pt becoming tearful during session. She continues to require mod A for bed mobility and min-mod A for transfers with RW. Unable to tolerate gait training at this time. Continue to recommend pt d/c to SNF for short-term rehab prior to returning home. Pt would continue to benefit from skilled physical therapy services at this time while admitted and after d/c to address the below listed limitations in order to improve overall safety and independence with functional mobility.    Follow Up Recommendations  SNF;Supervision/Assistance - 24 hour     Equipment Recommendations  None recommended by PT    Recommendations for Other Services       Precautions / Restrictions Precautions Precautions: Fall Restrictions Weight Bearing Restrictions: Yes RLE Weight Bearing: Weight bearing as tolerated    Mobility  Bed Mobility Overal bed mobility: Needs Assistance Bed Mobility: Supine to Sit     Supine to sit: Mod assist     General bed mobility comments: increased time and effort, assistance needed to move bilateral LEs off of bed and for trunk elevation; use of bed pads to position pt's hips at EOB  Transfers Overall transfer level: Needs assistance Equipment used: Rolling walker (2 wheeled) Transfers: Sit to/from [facility] Sit to Stand: Min assist Stand pivot transfers: Mod assist       General transfer comment: cueing for safe hand placement, verbal and tactile cueing needed for technique and sequencing with pivotal movement to chair towards  pt's L side  Ambulation/Gait             General Gait Details: pt unable to tolerate   Stairs             Wheelchair Mobility    Modified Rankin (Stroke Patients Only)       Balance Overall balance assessment: Needs assistance Sitting-balance support: Feet supported Sitting balance-Leahy Scale: Good     Standing balance support: Bilateral upper extremity supported Standing balance-Leahy Scale: Poor                              Cognition Arousal/Alertness: Awake/alert Behavior During Therapy: WFL for tasks assessed/performed Overall Cognitive Status: Within Functional Limits for tasks assessed                                        Exercises General Exercises - Lower Extremity Ankle Circles/Pumps: AROM;Both;10 reps;Seated Long Arc Quad: AROM;Strengthening;Right;5 reps;Seated    General Comments        Pertinent Vitals/Pain Pain Assessment: Faces Faces Pain Scale: Hurts worst Pain Location: R LE Pain Descriptors / Indicators: Aching;Grimacing;Guarding;Sore;Discomfort;Moaning Pain Intervention(s): Monitored during session;Repositioned    Home Living                      Prior Function            PT Goals (current goals can now be found in the care  plan section) Acute Rehab PT Goals PT Goal Formulation: With patient Time For Goal Achievement: 12/19/18 Potential to Achieve Goals: Good Progress towards PT goals: Progressing toward goals    Frequency    Min 2X/week      PT Plan Current plan remains appropriate;Frequency needs to be updated    Co-evaluation              AM-PAC PT "6 Clicks" Mobility   Outcome Measure  Help needed turning from your back to your side while in a flat bed without using bedrails?: A Lot Help needed moving from lying on your back to sitting on the side of a flat bed without using bedrails?: A Lot Help needed moving to and from a bed to a chair (including a  wheelchair)?: A Lot Help needed standing up from a chair using your arms (e.g., wheelchair or bedside chair)?: A Lot Help needed to walk in hospital room?: A Lot Help needed climbing 3-5 steps with a railing? : A Lot 6 Click Score: 12    End of Session Equipment Utilized During Treatment: Gait belt Activity Tolerance: Patient limited by pain Patient left: in chair;with call bell/phone within reach;with chair alarm set;with nursing/sitter in room Nurse Communication: Mobility status PT Visit Diagnosis: Pain;Other abnormalities of gait and mobility (R26.89) Pain - Right/Left: Right Pain - part of body: Hip;Leg     Time: [id number] PT Time Calculation (min) (ACUTE ONLY): 38 min  Charges:  $Therapeutic Activity: 38-52 mins                     Sherie Don, PT, DPT  Acute Rehabilitation Services Pager [phone] Office [place] 12/13/2018, 11:04 AM

## 2018-12-13 NOTE — Progress Notes (Signed)
Noted patient has received morphine and percocet at different times then noted she has both listed as allergies too- placed a notification to on call hospitalist- she is not experiencing any reactions to at this time

## 2018-12-13 NOTE — Progress Notes (Signed)
This RN notified by central monitoring that patient's heart rate had dropped down into the 30's but did return to normal.  Patient alert and oriented x4.  MD made aware.

## 2018-12-13 NOTE — Progress Notes (Signed)
Physical Therapy Treatment Patient Details Name: [name] MRN: [id number] DOB: 08-25-1934 Today's Date: 12/13/2018    History of Present Illness Pt is an 84 y/o female admitted after a mechanical fall in which she sustained a R inferior and superior pubic rami fx. Plan is for non-operative management. PMH including but not limited to     PT Comments    Pt seen for a second session today as a request to assist pt back to bed from recliner chair. Pt remains greatly limited secondary to pain and weakness; however, she is very sweet and motivated to improve. Pt would continue to benefit from skilled physical therapy services at this time while admitted and after d/c to address the below listed limitations in order to improve overall safety and independence with functional mobility.    Follow Up Recommendations  SNF;Supervision/Assistance - 24 hour     Equipment Recommendations  None recommended by PT    Recommendations for Other Services       Precautions / Restrictions Precautions Precautions: Fall Restrictions Weight Bearing Restrictions: Yes RLE Weight Bearing: Weight bearing as tolerated    Mobility  Bed Mobility Overal bed mobility: Needs Assistance Bed Mobility: Sit to Supine     Supine to sit: Mod assist Sit to supine: Min assist   General bed mobility comments: min A needed to return LEs onto bed  Transfers Overall transfer level: Needs assistance Equipment used: Rolling walker (2 wheeled) Transfers: Sit to/from [facility] Sit to Stand: Min assist Stand pivot transfers: Min assist       General transfer comment: cueing for safe hand placement, verbal and tactile cueing needed for technique and sequencing with pivotal movement to chair towards pt's L side  Ambulation/Gait             General Gait Details: pt unable to tolerate   Stairs             Wheelchair Mobility    Modified Rankin (Stroke Patients Only)        Balance Overall balance assessment: Needs assistance Sitting-balance support: Feet supported Sitting balance-Leahy Scale: Good     Standing balance support: Bilateral upper extremity supported Standing balance-Leahy Scale: Poor                              Cognition Arousal/Alertness: Awake/alert Behavior During Therapy: WFL for tasks assessed/performed Overall Cognitive Status: Impaired/Different from baseline Area of Impairment: Memory;Problem solving                     Memory: Decreased short-term memory       Problem Solving: Difficulty sequencing;Requires verbal cues;Requires tactile cues        Exercises General Exercises - Lower Extremity Ankle Circles/Pumps: AROM;Both;10 reps;Seated Long Arc Quad: AROM;Strengthening;Right;5 reps;Seated    General Comments        Pertinent Vitals/Pain Pain Assessment: Faces Faces Pain Scale: Hurts worst Pain Location: R LE Pain Descriptors / Indicators: Aching;Grimacing;Guarding;Sore;Discomfort;Moaning Pain Intervention(s): Monitored during session;Repositioned    Home Living                      Prior Function            PT Goals (current goals can now be found in the care plan section) Acute Rehab PT Goals PT Goal Formulation: With patient Time For Goal Achievement: 12/19/18 Potential to Achieve Goals: Good Progress towards PT goals:  Progressing toward goals    Frequency    Min 2X/week      PT Plan Current plan remains appropriate;Frequency needs to be updated    Co-evaluation              AM-PAC PT "6 Clicks" Mobility   Outcome Measure  Help needed turning from your back to your side while in a flat bed without using bedrails?: A Little Help needed moving from lying on your back to sitting on the side of a flat bed without using bedrails?: A Little Help needed moving to and from a bed to a chair (including a wheelchair)?: A Little Help needed standing up from  a chair using your arms (e.g., wheelchair or bedside chair)?: A Little Help needed to walk in hospital room?: A Lot Help needed climbing 3-5 steps with a railing? : Total 6 Click Score: 15    End of Session Equipment Utilized During Treatment: Gait belt Activity Tolerance: Patient limited by pain Patient left: in bed;with call bell/phone within reach;with bed alarm set Nurse Communication: Mobility status PT Visit Diagnosis: Pain;Other abnormalities of gait and mobility (R26.89) Pain - Right/Left: Right Pain - part of body: Hip;Leg     Time: [id number] PT Time Calculation (min) (ACUTE ONLY): 20 min  Charges:  $Therapeutic Activity: 8-22 mins                     Sherie Don, PT, DPT  Acute Rehabilitation Services Pager [phone] Office [place] 12/13/2018, 1:36 PM

## 2018-12-13 NOTE — Consult Note (Signed)
ORTHOPAEDIC CONSULTATION  REQUESTING PHYSICIAN: Nita Sells, MD  Chief Complaint: Right hip pain  HPI: [name] is a 84 y.o. female who complains of right hip pain after mechanical fall.  She presented to the emergency room, unable to bear weight.  Significant pain.  Pain better with narcotics, worse with movement.  She failed to mobilize adequately from the emergency room, and they were unable to arrange for skilled nursing transfer from the emergency room, so she has been admitted for IV pain medications, physical therapy, and ultimately placement.  She had a right hip replaced by Dr. Wynelle Link, who she identifies as her primary orthopedist.  Past Medical History:  Diagnosis Date  . Allergy    SEASONAL  . Arthritis   . Asthma    "slight"   . Baker's cyst of knee, right   . CAD (coronary artery disease)    a. Myoview 10/15 - normal EF 70% // Myoview 11/16: EF 75%, normal perfusion, (there was no blood pressure drop at low level exercise with Lexiscan infusion), low risk study // c. LHC 3/17 - LAD irregs, oLCx 70 (neg FFR), mRCA 30, EF 55-65% >> med Rx // Myoview 07/2018:  EF 73, extracardiac uptake, no significant ischemia (reviewed with Dr. Burt Knack), Low Risk   . Carotid stenosis    a. Carotid US 9/15 - Bilateral 1-39% ICA >> FU 2 years // b. Bilateral ICA 1-39 >> FU prn // Carotid US 06/2018: bilat 1-39; fu prn  . Dyspnea    due to Sarcoidosis  . Elbow fracture 04/2017   Right, had surgery  . Esophageal reflux   . Hematochezia   . History of echocardiogram    a. Echo 10/15 - EF 60-65%, no RWMA  . HLD (hyperlipidemia)   . Hx of colonoscopy   . Osteoporosis   . Other chronic pulmonary heart diseases   . Paroxysmal supraventricular tachycardia ([place])   . Pneumonia   . Sarcoidosis    Past Surgical History:  Procedure Laterality Date  . arm surgery Right   . BLADDER SURGERY    . CARDIAC CATHETERIZATION N/A 05/20/2015   Procedure: Left Heart Cath and Coronary  Angiography;  Surgeon: Sherren Mocha, MD;  Location: [facility];  Service: Cardiovascular;  Laterality: N/A;  . CARPAL TUNNEL RELEASE Left   . COLONOSCOPY    . FOOT SURGERY Right   . HIP SURGERY Right 2011   full replacement  . LEG SURGERY Left May 2014   femur fracture s/p open and closed reduction in [place], Dr. Jimmye Norman  . ORIF ELBOW FRACTURE Right 05/09/2017   Procedure: ORIF right olecranon fracture with repair/reconstruction, ulnar nerve transposition as needed;  Surgeon: Roseanne Kaufman, MD;  Location: [facility];  Service: Orthopedics;  Laterality: Right;  Requests 90 mins  . POLYPECTOMY    . SHOULDER ARTHROSCOPY W/ ROTATOR CUFF REPAIR Right   . TRAPEZIUM RESECTION Right    Social History   Socioeconomic History  . Marital status: Widowed    Spouse name: Not on file  . Number of children: 1  . Years of education: Not on file  . Highest education level: Some college, no degree  Occupational History  . Occupation: retired    Comment: still works as Scientist, 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  Social Needs  . Financial resource strain: Not on file  . Food insecurity    Worry: Not on file    Inability: Not on file  . Transportation needs  Medical: Not on file    Non-medical: Not on file  Tobacco Use  . Smoking status: Never Smoker  . Smokeless tobacco: Never Used  Substance and Sexual Activity  . Alcohol use: No  . Drug use: No  . Sexual activity: Not Currently    Partners: Male    Birth control/protection: Post-menopausal  Lifestyle  . Physical activity    Days per week: Not on file    Minutes per session: Not on file  . Stress: Not on file  Relationships  . Social Herbalist on phone: Not on file    Gets together: Not on file    Attends religious service: Not on file    Active member of club or organization: Not on file    Attends meetings of clubs or organizations: Not on file    Relationship status: Not on file  Other Topics Concern  . Not  on file  Social History Narrative   Lives with daughter and son in law   Caffeine use:  Coffee 1 per day   Right handed   Family History  Problem Relation Age of Onset  . Colon cancer Mother 61  . Cancer Mother   . Lung cancer Father   . Heart disease Father   . Arrhythmia Father   . Cancer Father   . Cancer Sister        ovarian  . Heart attack Brother   . Esophageal cancer Neg Hx   . Rectal cancer Neg Hx   . Stomach cancer Neg Hx   . Stroke Neg Hx    Allergies  Allergen Reactions  . Levaquin [Levofloxacin] Other (See Comments)    Leg pain .Marland Kitchen Per doctor not to take again  . Sulfonamide Derivatives     Pt unsure  . Morphine Nausea Only  . Oxycodone-Acetaminophen Other (See Comments)    "talking out of my head"  . Percocet [Oxycodone-Acetaminophen] Nausea And Vomiting     Positive ROS: All other systems have been reviewed and were otherwise negative with the exception of those mentioned in the HPI and as above.  Physical Exam: General: Alert, no acute distress Cardiovascular: No pedal edema Respiratory: No cyanosis, no use of accessory musculature GI: No organomegaly, abdomen is soft and non-tender Skin: No lesions in the area of chief complaint Neurologic: Sensation intact distally Psychiatric: Patient is competent for consent with normal mood and affect Lymphatic: No axillary or cervical lymphadenopathy  MUSCULOSKELETAL: Pelvis seems stable, no evidence for infection, positive pain around the right hip and pelvis.  Assessment: Principal Problem:   Sacral insufficiency fracture Active Problems:   PERSONAL HX COLONIC POLYPS   DOE (dyspnea on exertion)   CAD (coronary artery disease)   Essential hypertension   Bronchiectasis without complication (HCC)   Hip fracture (HCC)  She has a pubic ramus fracture with corresponding sacral fracture, with history of right total hip replacement.  The ramus fracture extends just into the inferior aspect of the  acetabulum.  Plan: This is an acute severe injury, and she has had difficulty mobilizing.  I do not think surgical intervention would be indicated, although I may discussed her case with Dr. Marcelino Scot, our traumatologist.  I will also let Dr. Wynelle Link know that she is in-house.  For the time being, she can be weightbearing as tolerated, we will follow-up with x-rays in the next week or so, and plan for her to see Dr. Wynelle Link in the office in 1 to 2  weeks.    Johnny Bridge, MD Cell [phone]   12/13/2018 2:38 PM

## 2018-12-13 NOTE — TOC Progression Note (Addendum)
Transition of Care [facility]) - Progression Note    Patient Details  Name: CHASTINE ANIELLO MRN: [id number] Date of Birth: 01/11/1935  Transition of [facility]) CM/SW [place], [place] Phone Number: 12/13/2018, 10:30 AM  Clinical Narrative:    11:11am- Pt bed will not be available until tomorrow, per MD notes note medically stable at this time. Will need new COVID swab have alerted RN.   10:30am- Pt transfer from ED to 5N; pt has SN[facility] bed available at [facility] per family/ED CSW arrangements. COVID neg from 10/20; following for stability if pt able to dc today. MD paged   Expected Discharge Plan: [place] Barriers to Discharge: SNF Pending discharge summary, SNF Pending discharge orders  Expected Discharge Plan and Services Expected Discharge Plan: [place]   Discharge Planning Services: CM Consult Post Acute Care Choice: [place] Living arrangements for the past 2 months: Single Family Home      Social Determinants of Health (SDOH) Interventions    Readmission Risk Interventions No flowsheet data found.

## 2018-12-14 MED ORDER — IBUPROFEN 200 MG PO TABS
800.0000 mg | ORAL_TABLET | Freq: Four times a day (QID) | ORAL | Status: DC
  Administered 2018-12-14 – 2018-12-15 (×4): 800 mg via ORAL
  Filled 2018-12-14 (×4): qty 4

## 2018-12-14 MED ORDER — DILTIAZEM HCL ER COATED BEADS 120 MG PO CP24
120.0000 mg | ORAL_CAPSULE | Freq: Every day | ORAL | Status: DC
  Administered 2018-12-15: 09:00:00 120 mg via ORAL
  Filled 2018-12-14 (×2): qty 1

## 2018-12-14 MED ORDER — MORPHINE SULFATE (PF) 2 MG/ML IV SOLN
2.0000 mg | INTRAVENOUS | Status: DC | PRN
  Administered 2018-12-14 (×2): 2 mg via INTRAVENOUS
  Filled 2018-12-14 (×2): qty 1

## 2018-12-14 MED ORDER — BISACODYL 10 MG RE SUPP
10.0000 mg | Freq: Once | RECTAL | Status: AC
  Administered 2018-12-14: 11:00:00 10 mg via RECTAL
  Filled 2018-12-14: qty 1

## 2018-12-14 MED ORDER — PANTOPRAZOLE SODIUM 40 MG PO TBEC
40.0000 mg | DELAYED_RELEASE_TABLET | Freq: Two times a day (BID) | ORAL | Status: DC
  Administered 2018-12-14 – 2018-12-15 (×2): 40 mg via ORAL
  Filled 2018-12-14 (×2): qty 1

## 2018-12-14 MED ORDER — OXYCODONE-ACETAMINOPHEN 7.5-325 MG PO TABS
1.0000 | ORAL_TABLET | ORAL | Status: DC | PRN
  Administered 2018-12-14 – 2018-12-15 (×2): 2 via ORAL
  Administered 2018-12-15: 06:00:00 1 via ORAL
  Filled 2018-12-14: qty 2
  Filled 2018-12-14: qty 1
  Filled 2018-12-14: qty 2

## 2018-12-14 NOTE — Progress Notes (Signed)
Hospitalist progress note  If 7PM-7AM, please contact night coverage-Prefer AMION Page LAURON OFFENBACHER  [id number] DOB: [birth year] DOA: 12/11/2018 PCP: [name], MD  Narrative:  84 year old high functioning community dwelling female with bronchiectasis sarcoid CAD abnormal heart cath 2017 HTN allergies right hip replacement right olecranon fracture admitted with accidental fall and found to have sacral fractures Assessment & Plan: Sacral fractures Poor pain control increase ibuprofen 800 4 times daily, second choice Percocet 7.5, 1 to 2 tablets increased every 4 as needed third choice IV morphine 2 mg every 3 as needed-expect will have a protracted stay as pain is not managed yet inpatient and is requiring IV pain meds Mild AKI Improved to some degree-a.m. labs, keep saline x24 more hours Bronchiectasis, asthma, sarcoid All currently stable at this time Paroxysmal SVT Some evidence of low pulse rate overnight into the 30s which rebounded quickly-cutting back Cardizem XL from 1 80-1 20 Reflux Continue Protonix 40 daily Constipated Continue senna 1 tab at bedtime and MiraLAX 17 if no better third choice would be Dulcolax suppositories which have started   DVT prophylaxis: Lovenox  Code Status:   Full   Family Communication:   None at bedside today Disposition Plan: Inpatient pending resolution of pain  Consultants:   Orthopedics consulted by ED signed off Procedures:   None Antimicrobials:   No Subjective: Emotional and tearful-worried about her heart rate Asking if therapy can come back today-Long discussion with the patient about what is and is not available in an acute care hospital but hoping that we can get her into the chair if her pain is better controlled No chest pain   Objective: Vitals:   12/13/18 1343 12/13/18 2201 12/14/18 0317 12/14/18 0800  BP: (!) 100/41 (!) 122/45 121/62 (!) 122/48  Pulse: (!) 52 60 64 63  Resp: 15 20 16 14   Temp: 98.1 F (36.7  C) (!) 97.5 F (36.4 C) 98 F (36.7 C) 97.9 F (36.6 C)  TempSrc: Oral Oral Oral   SpO2: 98% 97% 100% 98%  Weight:      Height:        Intake/Output Summary (Last 24 hours) at 12/14/2018 1024 Last data filed at 12/14/2018 0500 Gross per 24 hour  Intake 2397.17 ml  Output 400 ml  Net 1997.17 ml   Filed Weights   12/11/18 1938  Weight: 56.7 kg    Examination: Frail Caucasian female no distress emotionally labile Chest clinically clear no added sound S1-S2 no murmur rub or gallop Unable to lift right leg significantly Pulses intact Power 5/5 except for right lower extremity  Data Reviewed: I have personally reviewed following labs and imaging studies CBC: Recent Labs  Lab 12/11/18 2025 12/12/18 1900 12/13/18 0351  WBC 9.9 9.3 8.2  NEUTROABS 7.8*  --   --   HGB 11.9* 11.9* 10.7*  HCT 36.5 37.2 32.9*  MCV 100.3* 101.1* 99.4  PLT 174 163 [id number]   Basic Metabolic Panel: Recent Labs  Lab 12/11/18 2025 12/13/18 0351  NA 136 138  K 3.6 3.9  CL 103 107  CO2 24 23  GLUCOSE 97 94  BUN 26* 17  CREATININE 0.79 0.71  CALCIUM 8.3* 7.5*   GFR: Estimated Creatinine Clearance: 44.8 mL/min (by C-G formula based on SCr of 0.71 mg/dL). Liver Function Tests: Recent Labs  Lab 12/13/18 0351  AST 16  ALT 13  ALKPHOS 41  BILITOT 0.5  PROT 4.5*  ALBUMIN 2.6*   No results for input(s): LIPASE, AMYLASE in the  last 168 hours. No results for input(s): AMMONIA in the last 168 hours. Coagulation Profile: No results for input(s): INR, PROTIME in the last 168 hours. Cardiac Enzymes: Radiology Studies: Reviewed images personally in health database  Scheduled Meds: . aspirin EC  81 mg Oral Daily  . calcium carbonate  1 tablet Oral Daily  . diltiazem  180 mg Oral Daily  . enoxaparin (LOVENOX) injection  40 mg Subcutaneous Q24H  . ibuprofen  600 mg Oral TID  . pantoprazole  40 mg Oral Daily  . polyethylene glycol  17 g Oral Daily  . rosuvastatin  10 mg Oral Q M,W,F  .  senna-docusate  1 tablet Oral QHS  . [START ON 12/17/2018] Vitamin D (Ergocalciferol)  50,000 Units Oral Q Fri   Continuous Infusions: . sodium chloride 75 mL/hr at 12/14/18 0409    LOS: 2 days   Time spent: [place], MD [facility]

## 2018-12-14 NOTE — Plan of Care (Signed)
  Problem: Education: Goal: Knowledge of General Education information will improve Description: Including pain rating scale, medication(s)/side effects and non-pharmacologic comfort measures 12/14/2018 2058 by Claire Shown, RN Outcome: Progressing 12/14/2018 2057 by Claire Shown, RN Outcome: Progressing   Problem: Activity: Goal: Risk for activity intolerance will decrease 12/14/2018 2058 by Claire Shown, RN Outcome: Progressing 12/14/2018 2057 by Claire Shown, RN Outcome: Progressing   Problem: Pain Managment: Goal: General experience of comfort will improve 12/14/2018 2058 by Claire Shown, RN Outcome: Progressing 12/14/2018 2057 by Claire Shown, RN Outcome: Progressing   Problem: Safety: Goal: Ability to remain free from injury will improve 12/14/2018 2058 by Claire Shown, RN Outcome: Progressing 12/14/2018 2057 by Claire Shown, RN Outcome: Progressing   Problem: Skin Integrity: Goal: Risk for impaired skin integrity will decrease Outcome: Progressing

## 2018-12-15 LAB — CBC WITH DIFFERENTIAL/PLATELET
Abs Immature Granulocytes: 0.13 10*3/uL — ABNORMAL HIGH (ref 0.00–0.07)
Basophils Absolute: 0 10*3/uL (ref 0.0–0.1)
Basophils Relative: 0 %
Eosinophils Absolute: 0.2 10*3/uL (ref 0.0–0.5)
Eosinophils Relative: 2 %
HCT: 34.7 % — ABNORMAL LOW (ref 36.0–46.0)
Hemoglobin: 11.4 g/dL — ABNORMAL LOW (ref 12.0–15.0)
Immature Granulocytes: 1 %
Lymphocytes Relative: 8 %
Lymphs Abs: 0.8 10*3/uL (ref 0.7–4.0)
MCH: 32.8 pg (ref 26.0–34.0)
MCHC: 32.9 g/dL (ref 30.0–36.0)
MCV: 99.7 fL (ref 80.0–100.0)
Monocytes Absolute: 0.8 10*3/uL (ref 0.1–1.0)
Monocytes Relative: 9 %
Neutro Abs: 7.4 10*3/uL (ref 1.7–7.7)
Neutrophils Relative %: 80 %
Platelets: 187 10*3/uL (ref 150–400)
RBC: 3.48 MIL/uL — ABNORMAL LOW (ref 3.87–5.11)
RDW: 13.6 % (ref 11.5–15.5)
WBC: 9.3 10*3/uL (ref 4.0–10.5)
nRBC: 0 % (ref 0.0–0.2)

## 2018-12-15 LAB — RENAL FUNCTION PANEL
Albumin: 2.3 g/dL — ABNORMAL LOW (ref 3.5–5.0)
Anion gap: 6 (ref 5–15)
BUN: 11 mg/dL (ref 8–23)
CO2: 23 mmol/L (ref 22–32)
Calcium: 8.1 mg/dL — ABNORMAL LOW (ref 8.9–10.3)
Chloride: 107 mmol/L (ref 98–111)
Creatinine, Ser: 0.54 mg/dL (ref 0.44–1.00)
GFR calc Af Amer: >60 mL/min (ref >60)
GFR calc non Af Amer: >60 mL/min (ref >60)
Glucose, Bld: 104 mg/dL — ABNORMAL HIGH (ref 70–99)
Phosphorus: 1.8 mg/dL — ABNORMAL LOW (ref 2.5–4.6)
Potassium: 4.3 mmol/L (ref 3.5–5.1)
Sodium: 136 mmol/L (ref 135–145)

## 2018-12-15 MED ORDER — OXYCODONE-ACETAMINOPHEN 7.5-325 MG PO TABS: 1.0000 | ORAL_TABLET | ORAL | 0 refills | Status: DC | PRN

## 2018-12-15 MED ORDER — DILTIAZEM HCL ER COATED BEADS 120 MG PO CP24: 120.0000 mg | ORAL_CAPSULE | Freq: Every day | ORAL | Status: DC

## 2018-12-15 MED ORDER — POLYETHYLENE GLYCOL 3350 17 G PO PACK: 17.0000 g | PACK | Freq: Every day | ORAL | 0 refills | Status: DC

## 2018-12-15 MED ORDER — IBUPROFEN 800 MG PO TABS: 800.0000 mg | ORAL_TABLET | Freq: Four times a day (QID) | ORAL | 0 refills | Status: DC

## 2018-12-15 MED ORDER — SENNOSIDES-DOCUSATE SODIUM 8.6-50 MG PO TABS: 1.0000 | ORAL_TABLET | Freq: Every day | ORAL | Status: DC

## 2018-12-15 NOTE — Discharge Summary (Signed)
Physician Discharge Summary  PAT REINERTSEN [id number] DOB: 1934/07/23 DOA: 12/11/2018  PCP: [name], MD  Admit date: 12/11/2018 Discharge date: 12/15/2018  Time spent: 25 minutes  Recommendations for Outpatient Follow-up:  1. Recommend outpatient follow-up with orthopedics if so inclined in the outpatient setting 2. Discharging to skilled facility for rehab services 3. Suggest vestibular rehab at skilled facility if there are further ongoing concerns about dizziness 4. Suggest CBC Chem-12 1 week 5. Note dosage changing of diltiazem  Discharge Diagnoses:  Principal Problem:   Sacral insufficiency fracture Active Problems:   PERSONAL HX COLONIC POLYPS   DOE (dyspnea on exertion)   CAD (coronary artery disease)   Essential hypertension   Bronchiectasis without complication ([place])   Hip fracture [facility])   Discharge Condition: improved   Diet recommendation: hh  Filed Weights   12/11/18 1938 12/15/18 0307  Weight: 56.7 kg 63 kg    History of present illness:  84 year old high functioning community dwelling female with bronchiectasis sarcoid CAD abnormal heart cath 2017 HTN allergies right hip replacement right olecranon fracture admitted with accidental fall and found to have sacral fractures  Hospital Course:  Sacral fractures Poor pain control increase ibuprofen 800 4 times daily, second choice Percocet 7.5, 1 to 2 tablets increased every 4 d-pain more manageable on discharge and weightbearing as tolerated Mild AKI Had this on admission given IV fluids recheck labs in outpatient setting Bronchiectasis, asthma, sarcoid All currently stable at this time Paroxysmal SVT Some evidence of low pulse rate overnight into the 30s which rebounded quickly-cutting back Cardizem XL from 1 80-120 Reflux Continue Protonix 40 daily Constipated Continue senna 1 tab at bedtime and MiraLAX 17 if no better third choice would be Dulcolax suppositories which have started pain  more manageable on discharge  Consultations:  Orthopedics  Discharge Exam: Vitals:   12/15/18 0307 12/15/18 0735  BP: (!) 134/57 (!) 130/49  Pulse: 73 69  Resp: 16 14  Temp: 98.5 F (36.9 C) 98.3 F (36.8 C)  SpO2: 91% 91%    General: Awake alert coherent no distress seems in less pain no chest pain Cardiovascular: S1-S2 no murmur rub or gallop Respiratory: Clinically clear no rales no rhonchi Abdomen soft no rebound no guarding Able to lift legs off bed to some degree and is improved today  Discharge Instructions   Discharge Instructions    Diet - low sodium heart healthy   Complete by: As directed    Increase activity slowly   Complete by: As directed      Allergies as of 12/15/2018      Reactions   Levaquin [levofloxacin] Other (See Comments)   Leg pain .Marland Kitchen Per doctor not to take again   Sulfonamide Derivatives    Pt unsure   Morphine Nausea Only   Oxycodone-acetaminophen Other (See Comments)   "talking out of my head"   Percocet [oxycodone-acetaminophen] Nausea And Vomiting      Medication List    STOP taking these medications   cetirizine 10 MG tablet Commonly known as: ZYRTEC   HYDROcodone-acetaminophen 5-325 MG tablet Commonly known as: NORCO/VICODIN   traMADol 50 MG tablet Commonly known as: ULTRAM     TAKE these medications   acetaminophen 500 MG tablet Commonly known as: TYLENOL Take 1,000 mg by mouth every 6 (six) hours as needed for mild pain.   albuterol 108 (90 Base) MCG/ACT inhaler Commonly known as: VENTOLIN HFA Inhale 2 puffs every 4 (four) hours as needed into the lungs for wheezing  or shortness of breath.   aspirin EC 81 MG tablet Take 1 tablet (81 mg total) by mouth daily.   calcium carbonate 500 MG chewable tablet Commonly known as: TUMS - dosed in mg elemental calcium Chew 1 tablet by mouth daily.   denosumab 60 MG/ML Soln injection Commonly known as: PROLIA Inject 60 mg into the skin every 6 (six) months. Administer in  upper arm, thigh, or abdomen   diltiazem 120 MG 24 hr capsule Commonly known as: CARDIZEM CD Take 1 capsule (120 mg total) by mouth daily. Start taking on: December 16, 2018 What changed:   medication strength  See the new instructions.   fluticasone 50 MCG/ACT nasal spray Commonly known as: FLONASE Place into both nostrils as needed for allergies or rhinitis.   ibuprofen 800 MG tablet Commonly known as: ADVIL Take 1 tablet (800 mg total) by mouth 4 (four) times daily.   ipratropium 0.03 % nasal spray Commonly known as: ATROVENT Place 2 sprays into both nostrils 4 (four) times daily as needed for rhinitis.   nitroGLYCERIN 0.4 MG SL tablet Commonly known as: NITROSTAT Place 1 tablet (0.4 mg total) under the tongue every 5 (five) minutes as needed for chest pain.   omeprazole 40 MG capsule Commonly known as: PRILOSEC Take 40 mg by mouth 2 (two) times daily.   ondansetron 4 MG disintegrating tablet Commonly known as: ZOFRAN-ODT Take 4 mg by mouth every 4 (four) hours as needed for nausea.   oxyCODONE-acetaminophen 7.5-325 MG tablet Commonly known as: PERCOCET Take 1-2 tablets by mouth every 4 (four) hours as needed for moderate pain.   polyethylene glycol 17 g packet Commonly known as: MIRALAX / GLYCOLAX Take 17 g by mouth daily. Start taking on: December 16, 2018   rosuvastatin 10 MG tablet Commonly known as: CRESTOR TAKE 1 TABLET 3 TIMES A WEEK AS DIRECTED (DOSE CHANGE) What changed: See the new instructions.   senna-docusate 8.6-50 MG tablet Commonly known as: Senokot-S Take 1 tablet by mouth at bedtime.   Vitamin D (Ergocalciferol) 1.25 MG (50000 UT) Caps capsule Commonly known as: DRISDOL Take 50,000 Units by mouth every Friday.      Allergies  Allergen Reactions  . Levaquin [Levofloxacin] Other (See Comments)    Leg pain .Marland Kitchen Per doctor not to take again  . Sulfonamide Derivatives     Pt unsure  . Morphine Nausea Only  . Oxycodone-Acetaminophen Other  (See Comments)    "talking out of my head"  . Percocet [Oxycodone-Acetaminophen] Nausea And Vomiting   Follow-up Information    Gaynelle Arabian, MD. Schedule an appointment as soon as possible for a visit in 1 week(s).   Specialty: Orthopedic Surgery Contact information: [address] [place] [address] [place] [address] [phone]            The results of significant diagnostics from this hospitalization (including imaging, microbiology, ancillary and laboratory) are listed below for reference.    Significant Diagnostic Studies: Ct Hip Right Wo Contrast  Result Date: 12/11/2018 CLINICAL DATA:  Hip pain due to trauma. EXAM: CT OF THE RIGHT HIP WITHOUT CONTRAST TECHNIQUE: Multidetector CT imaging of the right hip was performed according to the standard protocol. Multiplanar CT image reconstructions were also generated. COMPARISON:  Radiographs dated 04/29/2017 FINDINGS: Bones/Joint/Cartilage There is a displaced fracture of the right superior pubic ramus at the junction with the acetabulum extending into the anterior inferior rim of the acetabulum. There is a slightly displaced fracture of the right inferior pubic ramus. There  is an impacted vertical fracture of the right sacral ala, incompletely visualized. The right total hip prosthesis appears in good position with no evidence of loosening or fracture of the proximal femur. Muscles and Tendons No acute abnormality. Soft tissues Edema in the subcutaneous fat at the lateral aspect of the right hip consistent with soft tissue contusion. IMPRESSION: 1. Fractures of the right superior and inferior pubic rami as described. 2. Impacted vertical fracture of the right sacral ala, incompletely visualized. 3. Soft tissue contusion at the lateral aspect of the right hip. Electronically Signed   By: Lorriane Shire M.D.   On: 12/11/2018 20:37    Microbiology: Recent Results (from the past 240 hour(s))  SARS CORONAVIRUS 2 (TAT 6-24 HRS)  Nasopharyngeal Nasopharyngeal Swab     Status: None   Collection Time: 12/11/18  8:27 PM   Specimen: Nasopharyngeal Swab  Result Value Ref Range Status   SARS Coronavirus 2 NEGATIVE NEGATIVE Final    Comment: (NOTE) SARS-CoV-2 target nucleic acids are NOT DETECTED. The SARS-CoV-2 RNA is generally detectable in upper and lower respiratory specimens during the acute phase of infection. Negative results do not preclude SARS-CoV-2 infection, do not rule out co-infections with other pathogens, and should not be used as the sole basis for treatment or other patient management decisions. Negative results must be combined with clinical observations, patient history, and epidemiological information. The expected result is Negative. Fact Sheet for Patients: SugarRoll.be Fact Sheet for Healthcare Providers: https://www.woods-mathews.com/ This test is not yet approved or cleared by the [place] FDA and  has been authorized for detection and/or diagnosis of SARS-CoV-2 by FDA under an Emergency Use Authorization (EUA). This EUA will remain  in effect (meaning this test can be used) for the duration of the COVID-19 declaration under Section 56 4(b)(1) of the Act, 21 U.S.C. section 360bbb-3(b)(1), unless the authorization is terminated or revoked sooner. Performed at [facility], [place] [address] [place], [place] [address]   SARS CORONAVIRUS 2 (TAT 6-24 HRS) Nasopharyngeal Nasopharyngeal Swab     Status: None   Collection Time: 12/13/18  4:00 PM   Specimen: Nasopharyngeal Swab  Result Value Ref Range Status   SARS Coronavirus 2 NEGATIVE NEGATIVE Final    Comment: (NOTE) SARS-CoV-2 target nucleic acids are NOT DETECTED. The SARS-CoV-2 RNA is generally detectable in upper and lower respiratory specimens during the acute phase of infection. Negative results do not preclude SARS-CoV-2 infection, do not rule out co-infections with other pathogens,  and should not be used as the sole basis for treatment or other patient management decisions. Negative results must be combined with clinical observations, patient history, and epidemiological information. The expected result is Negative. Fact Sheet for Patients: SugarRoll.be Fact Sheet for Healthcare Providers: https://www.woods-mathews.com/ This test is not yet approved or cleared by the [place] FDA and  has been authorized for detection and/or diagnosis of SARS-CoV-2 by FDA under an Emergency Use Authorization (EUA). This EUA will remain  in effect (meaning this test can be used) for the duration of the COVID-19 declaration under Section 56 4(b)(1) of the Act, 21 U.S.C. section 360bbb-3(b)(1), unless the authorization is terminated or revoked sooner. Performed at [facility], [place] [address] [place],  [address]      Labs: Basic Metabolic Panel: Recent Labs  Lab 12/11/18 2025 12/13/18 0351 12/15/18 0325  NA 136 138 136  K 3.6 3.9 4.3  CL 103 107 107  CO2 24 23 23   GLUCOSE 97 94 104*  BUN 26* 17 11  CREATININE 0.79 0.71 0.54  CALCIUM 8.3* 7.5* 8.1*  PHOS  --   --  1.8*   Liver Function Tests: Recent Labs  Lab 12/13/18 0351 12/15/18 0325  AST 16  --   ALT 13  --   ALKPHOS 41  --   BILITOT 0.5  --   PROT 4.5*  --   ALBUMIN 2.6* 2.3*   No results for input(s): LIPASE, AMYLASE in the last 168 hours. No results for input(s): AMMONIA in the last 168 hours. CBC: Recent Labs  Lab 12/11/18 2025 12/12/18 1900 12/13/18 0351 12/15/18 0325  WBC 9.9 9.3 8.2 9.3  NEUTROABS 7.8*  --   --  7.4  HGB 11.9* 11.9* 10.7* 11.4*  HCT 36.5 37.2 32.9* 34.7*  MCV 100.3* 101.1* 99.4 99.7  PLT 174 163 156 187   Cardiac Enzymes: No results for input(s): CKTOTAL, CKMB, CKMBINDEX, TROPONINI in the last 168 hours. BNP: BNP (last 3 results) No results for input(s): BNP in the last 8760 hours.  ProBNP (last 3  results) No results for input(s): PROBNP in the last 8760 hours.  CBG: No results for input(s): GLUCAP in the last 168 hours.     Signed:  Nita Sells MD   [facility] 12/15/2018, 10:10 AM

## 2018-12-15 NOTE — TOC Transition Note (Addendum)
Transition of Care [facility]) - CM/SW Discharge Note   Patient Details  Name: [name] MRN: [id number] Date of Birth: [birth year]  Transition of Care [facility]) CM/SW Contact:  Bary Castilla, LCSW Phone Number: [phone] 12/15/2018, 1:58 PM   Clinical Narrative:    CSW had a lengthy conversation with patient's daughter due to her various questions about the facility. Patient's daughter wanted to follow up with facility with her questions. Facility answered patient's daughter questions.  Patient will DC to:?[place] date:?12/15/18 Family notified:?Daughter Transport by: Corey Harold   Per MD patient ready for DC to [facility]. RN, patient, patient's family, and facility notified of DC. Discharge Summary sent to facility. RN given number for report [id number] room 125 B     . DC packet on chart. Ambulance transport requested for patient.   CSW signing off.   [place], [place] [phone]    Final next level of care: Skilled Nursing Facility Barriers to Discharge: No Barriers Identified   Patient Goals and CMS Choice Patient states their goals for this hospitalization and ongoing recovery are:: for patient to receive short term rehab CMS Medicare.gov Compare Post Acute Care list provided to:: Patient Represenative (must comment)(pt daughter) Choice offered to / list presented to : Adult Children  Discharge Placement              Patient chooses bed at: [facility] Patient to be transferred to facility by: [place] Name of family member notified: Daughter Patient and family notified of of transfer: 12/15/18  Discharge Plan and Services   Discharge Planning Services: CM Consult Post Acute Care Choice: [place]                               Social Determinants of Health (SDOH) Interventions     Readmission Risk Interventions No flowsheet data found.

## 2018-12-27 ENCOUNTER — Telehealth: Payer: Self-pay | Admitting: Emergency Medicine

## 2018-12-27 NOTE — Telephone Encounter (Signed)
LMTCB

## 2018-12-27 NOTE — Telephone Encounter (Signed)
[name] is calling back

## 2018-12-27 NOTE — Telephone Encounter (Signed)
Left message for Ok Edwards to call back.

## 2018-12-28 NOTE — Telephone Encounter (Signed)
Call returned to patient daughter Ok Edwards (Gwendalyn Ege), she states her mother fell and broke her pelvic bone 10/19. She was later transferred to [facility] and was later transferred to [facility]. She reports her mother later developed rales and SOB and was given an antibiotic. She was retested 11/3 and she was positive for covid as of yesterday. They are stopping her antibiotic for PNE today. The daughter wants to know should she stop the antibiotic or does she need another antibiotic. She states the director called her and told them her mother will have to be transferred to the Covid wing within the nursing home for 21 days. The patient was supposed to come home today. They are trying to send her home with Covid at the patients request. They are looking for a lot of guidance as far as what to do when she comes home. She reports her mother is still coughing up clear mucous. She states she recently spoke with the Nurse Practitioner and they were told her lungs were clear. She is very concerned that her mother needs a lot of help as far activities of daily living. I made her aware we could get home health involved. I made her aware RB is not in clinic this week so I would get the message sent to a Nurse Practitioner and we would give her a call back. Voiced understanding.   Beth please advise.

## 2018-12-28 NOTE — Telephone Encounter (Signed)
She should stay at facility, it is safer for her and her family. Antibiotics are not typically recommended for COVID pneumonia d/t viral nature. Continue mucinex, bronchodilators and supplemental oxygen as needed.

## 2018-12-28 NOTE — Telephone Encounter (Signed)
I agree with these recommendations. Once she has recovered from this illness they can revisit the possibility of transitioning to home.

## 2018-12-28 NOTE — Telephone Encounter (Signed)
Spoke with daughter Ok Edwards to make aware of RB's recs.  Nothing further needed at this time- will close encounter.

## 2018-12-28 NOTE — Telephone Encounter (Signed)
Pt daughter calling back to speak with a nurse [phone] Ok Edwards

## 2018-12-28 NOTE — Telephone Encounter (Signed)
Call returned to patient daughter, made aware of EW recommendations. Voiced understanding. I made her aware that we would get this message to Dr. [name] as well. Voiced understanding.   RB please advise. Thanks.

## 2018-12-31 ENCOUNTER — Other Ambulatory Visit: Payer: Self-pay | Admitting: *Deleted

## 2018-12-31 NOTE — Patient Outreach (Signed)
Member assessed for potential [facility] Care Management needs as a benefit of [place] Medicare.  Member is currently receiving rehab therapy [facility].  Per chart records, member is from home with daughter.  Writer will continue to follow for potential [facility] Care Management needs, disposition plans, and for progression.   Marthenia Rolling, MSN-Ed, RN,BSN [place] Acute Care Coordinator [phone] [facility]) [phone]  (Toll free office)

## 2019-01-01 ENCOUNTER — Telehealth: Payer: Self-pay | Admitting: Emergency Medicine

## 2019-01-01 NOTE — Telephone Encounter (Signed)
Call returned to patient, confirmed DOB, she states her mother fell and broke her pubic bone and she was at [facility]. She states she was re-tested for covid on Friday and it was negative so she was D/C home. She states her mother still has cough and producing a lot of mucous. She finished a antibiotic while at the Rehab facility. Daughter is concerned that she needs to f/u with [place]. She is wanting to have the patient come in and have a CXR and have her lungs assessed. Denies fever, N/V/D. Covid Screen Negative.   Appt made at daughter request. Nothing further needed at this time.

## 2019-01-04 ENCOUNTER — Other Ambulatory Visit: Payer: Self-pay | Admitting: *Deleted

## 2019-01-04 DIAGNOSIS — N179 Acute kidney failure, unspecified: Secondary | ICD-10-CM | POA: Insufficient documentation

## 2019-01-04 HISTORY — DX: Acute kidney failure, unspecified: N17.9

## 2019-01-04 NOTE — Patient Outreach (Signed)
Member assessed for potential [facility] Care Management needs as a benefit of [place] Medicare.  Confirmed in Patient Pearletha For[place] that Mrs. Forrer discharged home on 12/31/18 from [facility].  Telephone call made to Mrs. Rosencrans at [phone]. Patient identifiers confirmed.   Mrs. Lura confirms she discharged from [facility] on Monday. States she lives at home with her daughter and son in [place]. She has [facility] for PT/OT. Reports she has a follow up appointment with ortho on next Thursday. She also has a scheduled appointment with pulmonologist on next Monday.  Mrs. Bottari denies any medication, chronic disease, and community resource needs. States she feels better that her pneumonia is improving. Has been on Augmentin x 10 days.   Mrs. Wilger pleasantly declines [facility] Care Management follow up. Writer provided contact information and 24-hr nurse advice line telephone number to call if needed.   No identifiable [facility] Care Management needs.    Marthenia Rolling, MSN-Ed, RN,BSN [place] Acute Care Coordinator [phone] [facility]) [phone]  (Toll free office)

## 2019-01-07 ENCOUNTER — Encounter: Payer: Self-pay | Admitting: Primary Care

## 2019-01-07 ENCOUNTER — Other Ambulatory Visit: Payer: Self-pay

## 2019-01-07 ENCOUNTER — Ambulatory Visit (INDEPENDENT_AMBULATORY_CARE_PROVIDER_SITE_OTHER): Payer: Medicare Other | Admitting: Primary Care

## 2019-01-07 DIAGNOSIS — J479 Bronchiectasis, uncomplicated: Secondary | ICD-10-CM

## 2019-01-07 DIAGNOSIS — J471 Bronchiectasis with (acute) exacerbation: Secondary | ICD-10-CM | POA: Diagnosis not present

## 2019-01-07 MED ORDER — FLUTTER DEVI: 0 refills | Status: DC

## 2019-01-07 MED ORDER — ALBUTEROL SULFATE HFA 108 (90 BASE) MCG/ACT IN AERS: 2.0000 | INHALATION_SPRAY | RESPIRATORY_TRACT | 1 refills | Status: DC | PRN

## 2019-01-07 NOTE — Assessment & Plan Note (Addendum)
-   Developed hospital acquired pneumonia in October at [facility]; She is clinically better but has some residual chest congestion  - Needs HRCT in 2-3 week to ensure resolution and follow-up on bronchiectasis  - Add flutter valve three times a day  - Recommend mucinex 600mg  twice daily for congestion  - Continue Albuterol hfa prn shortness of breath/wheezing - Up to date with influenza vaccine and pneumococcal

## 2019-01-07 NOTE — Patient Instructions (Addendum)
Pleasure meeting you both today  Recommendations: - Use flutter valve 2-3 times a day  - Take mucinex 600mg  twice daily for congestion * If you are still having a lot of mucus production we can try adding a nebulizer called hypertonic saline 3%   Orders: - HRCT in 2 week re: bronchiectasis/ pneumonia   Follow-up: - Due to see Dr. [name] in December    Bronchiectasis  Bronchiectasis is a condition in which the airways in the lungs (bronchi) are damaged and widened. The condition makes it hard for the lungs to get rid of mucus, and it causes mucus to gather in the bronchi. This condition often leads to lung infections, which can make the condition worse. What are the causes? You can be born with this condition or you can develop it later in life. Common causes of this condition include:  Cystic fibrosis.  Repeated lung infections, such as pneumonia or tuberculosis.  An object or other blockage in the lungs.  Breathing in fluid, food, or other objects (aspiration).  A problem with the immune system and lung structure that is present at birth (congenital). Sometimes the cause is not known. What are the signs or symptoms? Common symptoms of this condition include:  A daily cough that brings up mucus and lasts for more than 3 weeks.  Lung infections that happen often.  Shortness of breath and wheezing.  Weakness and fatigue. How is this diagnosed? This condition is diagnosed with tests, such as:  Chest X-rays or CT scans. These are done to check for changes in the lungs.  Breathing tests. These are done to check how well your lungs are working.  A test of a sample of your saliva (sputum culture). This test is done to check for infection.  Blood tests and other tests. These are done to check for related diseases or causes. How is this treated? Treatment for this condition depends on the severity of the illness and its cause. Treatment may include:  Medicines that loosen  mucus so it can be coughed up (expectorants).  Medicines that relax the muscles of the bronchi (bronchodilators).  Antibiotic medicines to prevent or treat infection.  Physical therapy to help clear mucus from the lungs. Techniques may include: ? Postural drainage. This is when you sit or lie in certain positions so that mucus can drain by gravity. ? Chest percussion. This involves tapping the chest or back with a cupped hand. ? Chest vibration. For this therapy, a hand or special equipment vibrates your chest and back.  Surgery to remove the affected part of the lung. This may be done in severe cases. Follow these instructions at home: Medicines  Take over-the-counter and prescription medicines only as told by your health care provider.  If you were prescribed an antibiotic medicine, take it as told by your health care provider. Do not stop taking the antibiotic even if you start to feel better.  Avoid taking sedatives and antihistamines unless your health care provider tells you to take them. These medicines tend to thicken the mucus in the lungs. Managing symptoms  Perform breathing exercises or techniques to clear your lungs as told by your health care provider.  Consider using a cold steam vaporizer or humidifier in your room or home to help loosen secretions.  If you have a cough that gets worse at night, try sleeping in a semi-upright position. General instructions  Get plenty of rest.  Drink enough fluid to keep your urine clear or pale  yellow.  Stay inside when pollution and ozone levels are high.  Stay up to date with vaccinations and immunizations.  Avoid cigarette smoke and other lung irritants.  Do not use any products that contain nicotine or tobacco, such as cigarettes and e-cigarettes. If you need help quitting, ask your health care provider.  Keep all follow-up visits as told by your health care provider. This is important. Contact a health care provider  if:  You cough up more sputum than before and the sputum is yellow or green in color.  You have a fever.  You cannot control your cough and are losing sleep. Get help right away if:  You cough up blood.  You have chest pain.  You have increasing shortness of breath.  You have pain that gets worse or is not controlled with medicines.  You have a fever and your symptoms suddenly get worse. Summary  Bronchiectasis is a condition in which the airways in the lungs (bronchi) are damaged and widened. The condition makes it hard for the lungs to get rid of mucus, and it causes mucus to gather in the bronchi.  Treatment usually includes therapy to help clear mucus from the lungs.  Stay up to date with vaccinations and immunizations. This information is not intended to replace advice given to you by your health care provider. Make sure you discuss any questions you have with your health care provider. Document Released: 12/05/2006 Document Revised: 01/20/2017 Document Reviewed: 03/14/2016 Elsevier Patient Education  2020 [employer].

## 2019-01-07 NOTE — Progress Notes (Signed)
@Patient  ID: [name], female    DOB: 1934-08-01, 84 y.o.   MRN: [id number]  Chief Complaint  Patient presents with   Cough with increased mucus production    Developed pneumonia while in [facility] (for therapy after hospitalization)  Mostly clear/white mucus, slight yellow color.     Referring provider: [name], MD  HPI: 84 year old female, never smoked. PMH significant for Bronchiectasis, sarcoidosis, obstructive lung disease, PNA with emphysema, allergic rhinitis, paroxysmal SVT, HTN, CAD, mitral regurgitation, GERD, scoliosis. Patient of Dr. [name], last seen on 11/07/18. PFTs showed obstruction with DLCO defect. Needs repeat CT chest in 1 year 2021 or sooner if develop new symptoms.   01/07/2019 Patient presents today for regular follow-up. Accompanied by her daughter. She was hospitalized in October for sacral fracture, discharged to [facility] place where she developed and was treated for pneumonia. Covid negative. She fees well overall, still has some chest congestion. Wheezing has resolved. No significant shortness of breath. Eating and drinking well. Afebrile.      Allergies  Allergen Reactions   Levaquin [Levofloxacin] Other (See Comments)    Leg pain .Marland Kitchen Per doctor not to take again   Sulfonamide Derivatives     Pt unsure   Morphine Nausea Only   Oxycodone-Acetaminophen Other (See Comments)    "talking out of my head"   Percocet [Oxycodone-Acetaminophen] Nausea And Vomiting    Immunization History  Administered Date(s) Administered   Influenza Inj Mdck Quad Pf 11/05/2018   Influenza, High Dose Seasonal PF 11/02/2016, 11/04/2017   Influenza-Unspecified 11/05/2013, 11/06/2014   Pneumococcal Conjugate-13 02/25/2014   Pneumococcal Polysaccharide-23 05/03/2001   Tdap 01/14/2012   Zoster 11/08/2013    Past Medical History:  Diagnosis Date   Allergy    SEASONAL   Arthritis    Asthma    "slight"    Baker's cyst of knee, right    CAD  (coronary artery disease)    a. Myoview 10/15 - normal EF 70% // Myoview 11/16: EF 75%, normal perfusion, (there was no blood pressure drop at low level exercise with Lexiscan infusion), low risk study // c. LHC 3/17 - LAD irregs, oLCx 70 (neg FFR), mRCA 30, EF 55-65% >> med Rx // Myoview 07/2018:  EF 73, extracardiac uptake, no significant ischemia (reviewed with Dr. Burt Knack), Low Risk    Carotid stenosis    a. Carotid US 9/15 - Bilateral 1-39% ICA >> FU 2 years // b. Bilateral ICA 1-39 >> FU prn // Carotid US 06/2018: bilat 1-39; fu prn   Dyspnea    due to Sarcoidosis   Elbow fracture 04/2017   Right, had surgery   Esophageal reflux    Hematochezia    History of echocardiogram    a. Echo 10/15 - EF 60-65%, no RWMA   HLD (hyperlipidemia)    Hx of colonoscopy    Osteoporosis    Other chronic pulmonary heart diseases    Paroxysmal supraventricular tachycardia (HCC)    Pneumonia    Sarcoidosis     Tobacco History: Social History   Tobacco Use  Smoking Status Never Smoker  Smokeless Tobacco Never Used   Counseling given: Not Answered   Outpatient Medications Prior to Visit  Medication Sig Dispense Refill   acetaminophen (TYLENOL) 500 MG tablet Take 1,000 mg by mouth every 6 (six) hours as needed for mild pain.      aspirin EC 81 MG tablet Take 1 tablet (81 mg total) by mouth daily.  calcium carbonate (TUMS - DOSED IN MG ELEMENTAL CALCIUM) 500 MG chewable tablet Chew 1 tablet by mouth daily.      denosumab (PROLIA) 60 MG/ML SOLN injection Inject 60 mg into the skin every 6 (six) months. Administer in upper arm, thigh, or abdomen     diltiazem (CARDIZEM CD) 120 MG 24 hr capsule Take 1 capsule (120 mg total) by mouth daily. 3 capsule    fluticasone (FLONASE) 50 MCG/ACT nasal spray Place into both nostrils as needed for allergies or rhinitis.     ibuprofen (ADVIL) 800 MG tablet Take 1 tablet (800 mg total) by mouth 4 (four) times daily. 30 tablet 0   ipratropium  (ATROVENT) 0.03 % nasal spray Place 2 sprays into both nostrils 4 (four) times daily as needed for rhinitis. 30 mL 11   nitroGLYCERIN (NITROSTAT) 0.4 MG SL tablet Place 1 tablet (0.4 mg total) under the tongue every 5 (five) minutes as needed for chest pain. 25 tablet 11   omeprazole (PRILOSEC) 40 MG capsule Take 40 mg by mouth 2 (two) times daily.      ondansetron (ZOFRAN-ODT) 4 MG disintegrating tablet Take 4 mg by mouth every 4 (four) hours as needed for nausea.      polyethylene glycol (MIRALAX / GLYCOLAX) 17 g packet Take 17 g by mouth daily. 14 each 0   rosuvastatin (CRESTOR) 10 MG tablet TAKE 1 TABLET 3 TIMES A WEEK AS DIRECTED (DOSE CHANGE) (Patient taking differently: Take 10 mg by mouth every Monday, Wednesday, and Friday. ) 36 tablet 3   senna-docusate (SENOKOT-S) 8.6-50 MG tablet Take 1 tablet by mouth at bedtime.     Vitamin D, Ergocalciferol, (DRISDOL) 50000 units CAPS capsule Take 50,000 Units by mouth every Friday.      albuterol (PROVENTIL HFA;VENTOLIN HFA) 108 (90 Base) MCG/ACT inhaler Inhale 2 puffs every 4 (four) hours as needed into the lungs for wheezing or shortness of breath. 1 Inhaler 6   HYDROcodone-acetaminophen (NORCO/VICODIN) 5-325 MG tablet Take 1 tablet by mouth every 6 (six) hours as needed.     oxyCODONE-acetaminophen (PERCOCET) 7.5-325 MG tablet Take 1-2 tablets by mouth every 4 (four) hours as needed for moderate pain. (Patient not taking: Reported on 01/07/2019) 6 tablet 0   No facility-administered medications prior to visit.    Review of Systems  Review of Systems  Constitutional: Negative.   Respiratory: Positive for cough. Negative for shortness of breath and wheezing.   Cardiovascular: Negative.    Physical Exam  BP 140/68 (BP Location: Left Arm, Patient Position: Sitting, Cuff Size: Normal)    Pulse 70    Temp (!) 97.2 F (36.2 C)    Ht 5\' 3"  (1.6 m)    Wt 127 lb (57.6 kg)    LMP 02/22/1983 (Approximate)    SpO2 96% Comment: on room air    BMI 22.50 kg/m  Physical Exam Constitutional:      Appearance: Normal appearance.  HENT:     Head: Normocephalic and atraumatic.     Mouth/Throat:     Mouth: Mucous membranes are moist.     Pharynx: Oropharynx is clear.  Neck:     Musculoskeletal: Normal range of motion and neck supple.  Cardiovascular:     Rate and Rhythm: Normal rate and regular rhythm.     Comments: No swelling Pulmonary:     Effort: Pulmonary effort is normal.     Breath sounds: No wheezing, rhonchi or rales.  Skin:    General: Skin is warm and  dry.  Neurological:     General: No focal deficit present.     Mental Status: She is alert and oriented to person, place, and time. Mental status is at baseline.  Psychiatric:        Mood and Affect: Mood normal.        Behavior: Behavior normal.        Thought Content: Thought content normal.        Judgment: Judgment normal.      Lab Results:  CBC    Component Value Date/Time   WBC 9.3 12/15/2018 0325   RBC 3.48 (L) 12/15/2018 0325   HGB 11.4 (L) 12/15/2018 0325   HCT 34.7 (L) 12/15/2018 0325   PLT 187 12/15/2018 0325   MCV 99.7 12/15/2018 0325   MCH 32.8 12/15/2018 0325   MCHC 32.9 12/15/2018 0325   RDW 13.6 12/15/2018 0325   LYMPHSABS 0.8 12/15/2018 0325   MONOABS 0.8 12/15/2018 0325   EOSABS 0.2 12/15/2018 0325   BASOSABS 0.0 12/15/2018 0325    BMET    Component Value Date/Time   NA 136 12/15/2018 0325   NA 140 11/02/2017 0923   K 4.3 12/15/2018 0325   CL 107 12/15/2018 0325   CO2 23 12/15/2018 0325   GLUCOSE 104 (H) 12/15/2018 0325   BUN 11 12/15/2018 0325   BUN 22 11/02/2017 0923   CREATININE 0.54 12/15/2018 0325   CREATININE 0.85 05/18/2015 1028   CALCIUM 8.1 (L) 12/15/2018 0325   GFRNONAA >60 12/15/2018 0325   GFRAA >60 12/15/2018 0325    BNP No results found for: BNP  ProBNP No results found for: PROBNP  Imaging: Ct Hip Right Wo Contrast  Result Date: 12/11/2018 CLINICAL DATA:  Hip pain due to trauma. EXAM: CT OF THE  RIGHT HIP WITHOUT CONTRAST TECHNIQUE: Multidetector CT imaging of the right hip was performed according to the standard protocol. Multiplanar CT image reconstructions were also generated. COMPARISON:  Radiographs dated 04/29/2017 FINDINGS: Bones/Joint/Cartilage There is a displaced fracture of the right superior pubic ramus at the junction with the acetabulum extending into the anterior inferior rim of the acetabulum. There is a slightly displaced fracture of the right inferior pubic ramus. There is an impacted vertical fracture of the right sacral ala, incompletely visualized. The right total hip prosthesis appears in good position with no evidence of loosening or fracture of the proximal femur. Muscles and Tendons No acute abnormality. Soft tissues Edema in the subcutaneous fat at the lateral aspect of the right hip consistent with soft tissue contusion. IMPRESSION: 1. Fractures of the right superior and inferior pubic rami as described. 2. Impacted vertical fracture of the right sacral ala, incompletely visualized. 3. Soft tissue contusion at the lateral aspect of the right hip. Electronically Signed   By: Lorriane Shire M.D.   On: 12/11/2018 20:37     Assessment & Plan:   Bronchiectasis without complication [facility]) - Developed hospital acquired pneumonia in October at [facility]; She is clinically better but has some residual chest congestion  - Needs HRCT in 2-3 week to ensure resolution and follow-up on bronchiectasis  - Add flutter valve three times a day  - Recommend mucinex 600mg  twice daily for congestion  - Continue Albuterol hfa prn shortness of breath/wheezing - Up to date with influenza vaccine and pneumococcal    Martyn Ehrich, NP 01/07/2019

## 2019-01-16 ENCOUNTER — Ambulatory Visit (HOSPITAL_COMMUNITY): Payer: Medicare Other

## 2019-01-22 ENCOUNTER — Ambulatory Visit
Admission: RE | Admit: 2019-01-22 | Discharge: 2019-01-22 | Disposition: A | Discharge status: Home / Self Care | Payer: Medicare Other | Source: Ambulatory Visit | Attending: Primary Care | Admitting: Primary Care

## 2019-01-22 DIAGNOSIS — J471 Bronchiectasis with (acute) exacerbation: Secondary | ICD-10-CM

## 2019-02-01 ENCOUNTER — Ambulatory Visit (INDEPENDENT_AMBULATORY_CARE_PROVIDER_SITE_OTHER): Payer: Medicare Other | Admitting: Emergency Medicine

## 2019-02-01 ENCOUNTER — Encounter: Payer: Self-pay | Admitting: Emergency Medicine

## 2019-02-01 ENCOUNTER — Other Ambulatory Visit: Payer: Self-pay

## 2019-02-01 DIAGNOSIS — J479 Bronchiectasis, uncomplicated: Secondary | ICD-10-CM

## 2019-02-01 NOTE — Progress Notes (Signed)
Virtual Visit via Telephone Note  I connected with [name] on 02/01/19 at  3:45 PM EST by telephone and verified that I am speaking with the correct person using two identifiers.  Location: Patient: Home Provider: Office   I discussed the limitations, risks, security and privacy concerns of performing an evaluation and management service by telephone and the availability of in person appointments. I also discussed with the patient that there may be a patient responsible charge related to this service. The patient expressed understanding and agreed to proceed.   History of Present Illness: [name] is 55.  She has a history of sarcoidosis with obstructive lung disease.  She also has bronchiectasis, allergic rhinitis and GERD with associated cough. She unfortunately experienced a pelvic fracture and then went to [facility]. Was dx with a PNA there, improved with abx x 10 days in October.   Allergy regimen: Flonase, atrovent NS prn (never needs it) GERD regimen: omeprazole 40mg  bid   Observations/Objective: She states that her breathing is doing well now, has recovered since PNA. She has albuterol to use prn, rarely uses it. She does not believe breathing is limiting her - she has been limited by her pelvic fx. Very little cough. Nasal drainage is improved, uses her flonase reliably. Flu shot and PNA shots.   CT chest 01/22/2019 reviewed by me. No real evidence to support active sarcoidosis. She has some mediastinal nodal calcifications, stable RLL bandlike linear opacity, some mild bronchiectasis that is stable.   Assessment and Plan: Continue current allergy regimen, GERD regimen Albuterol as needed.  Follow in 6 months. We can determine timing of any repeat imaging at that time.   Follow Up Instructions: 6 months or prn   I discussed the assessment and treatment plan with the patient. The patient was provided an opportunity to ask questions and all were answered. The patient  agreed with the plan and demonstrated an understanding of the instructions.   The patient was advised to call back or seek an in-person evaluation if the symptoms worsen or if the condition fails to improve as anticipated.  I provided 15 minutes of non-face-to-face time during this encounter.   Collene Gobble, MD

## 2019-02-14 DIAGNOSIS — M5136 Other intervertebral disc degeneration, lumbar region: Secondary | ICD-10-CM | POA: Insufficient documentation

## 2019-02-28 DIAGNOSIS — S32599A Other specified fracture of unspecified pubis, initial encounter for closed fracture: Secondary | ICD-10-CM

## 2019-02-28 HISTORY — DX: Other specified fracture of unspecified pubis, initial encounter for closed fracture: S32.599A

## 2019-03-01 ENCOUNTER — Ambulatory Visit (HOSPITAL_COMMUNITY): Payer: Medicare Other

## 2019-03-13 DIAGNOSIS — M545 Low back pain, unspecified: Secondary | ICD-10-CM | POA: Insufficient documentation

## 2019-03-19 NOTE — Progress Notes (Signed)
85 y.o. G75P1001 Widowed White or Caucasian Not Hispanic or Latino female here for annual exam.   No vaginal bleeding.   Patient states that she is having urinary pressure and urgency. Symptoms come and go for a while. She says that she see her urologist on Thursday and has seen her Urologist for this already. She has a long h/o OAB.  She has a h/o a uterine prolapse, occasionally notices it. Has some pressure vaginally when she is standing, sometimes. She has issues with constipation, uses metamucil and stool softener daily. She has a BM every 1-2 days, sometimes needs to strain.   She fell and fractured her pelvis last fall. She has osteoporosis and is on Prolia. Being followed by her primary.     Patient's last menstrual period was 02/22/1983 (approximate).          Sexually active: No.  The current method of family planning is post menopausal status.    Exercising: Yes.    Physical Therapy  Smoker:  no  Health Maintenance: Pap:  02-24-16 WNL, 12-02-13 WNL History of abnormal Pap:  no MMG:  04/16/18 Density C Bi-rads 2 Benign  BMD:   05/07/13, T Score -3.8 Left Forearm Total / 13.5 Right Forearm Total / 0.3 Spine Total (hips not calculated due to hardware) followed by PCP. She has an appointment later this month  Colonoscopy:09-18-13 polyps, will only have another one if she has problems.   TDaP:  01/14/12    reports that she has never smoked. She has never used smokeless tobacco. She reports that she does not drink alcohol or use drugs. Her daughter, 2 grand sons, 1 great grand daughter. Every one is local.   Past Medical History:  Diagnosis Date  . Allergy    SEASONAL  . Arthritis   . Asthma    "slight"   . Baker's cyst of knee, right   . CAD (coronary artery disease)    a. Myoview 10/15 - normal EF 70% // Myoview 11/16: EF 75%, normal perfusion, (there was no blood pressure drop at low level exercise with Lexiscan infusion), low risk study // c. LHC 3/17 - LAD irregs, oLCx 70  (neg FFR), mRCA 30, EF 55-65% >> med Rx // Myoview 07/2018:  EF 73, extracardiac uptake, no significant ischemia (reviewed with Dr. Burt Knack), Low Risk   . Carotid stenosis    a. Carotid US 9/15 - Bilateral 1-39% ICA >> FU 2 years // b. Bilateral ICA 1-39 >> FU prn // Carotid US 06/2018: bilat 1-39; fu prn  . Dyspnea    due to Sarcoidosis  . Elbow fracture 04/2017   Right, had surgery  . Esophageal reflux   . Hematochezia   . History of echocardiogram    a. Echo 10/15 - EF 60-65%, no RWMA  . HLD (hyperlipidemia)   . Hx of colonoscopy   . Osteoporosis   . Other chronic pulmonary heart diseases   . Paroxysmal supraventricular tachycardia ([place])   . Pneumonia   . Sarcoidosis     Past Surgical History:  Procedure Laterality Date  . arm surgery Right   . BLADDER SURGERY    . CARDIAC CATHETERIZATION N/A 05/20/2015   Procedure: Left Heart Cath and Coronary Angiography;  Surgeon: Sherren Mocha, MD;  Location: [facility];  Service: Cardiovascular;  Laterality: N/A;  . CARPAL TUNNEL RELEASE Left   . COLONOSCOPY    . FOOT SURGERY Right   . HIP SURGERY Right 2011   full replacement  .  LEG SURGERY Left May 2014   femur fracture s/p open and closed reduction in [place], Dr. Jimmye Norman  . ORIF ELBOW FRACTURE Right 05/09/2017   Procedure: ORIF right olecranon fracture with repair/reconstruction, ulnar nerve transposition as needed;  Surgeon: Roseanne Kaufman, MD;  Location: [facility];  Service: Orthopedics;  Laterality: Right;  Requests 90 mins  . pelvis fracture    . POLYPECTOMY    . SHOULDER ARTHROSCOPY W/ ROTATOR CUFF REPAIR Right   . TRAPEZIUM RESECTION Right     Current Outpatient Medications  Medication Sig Dispense Refill  . acetaminophen (TYLENOL) 500 MG tablet Take 1,000 mg by mouth every 6 (six) hours as needed for mild pain.     Marland Kitchen albuterol (VENTOLIN HFA) 108 (90 Base) MCG/ACT inhaler Inhale 2 puffs into the lungs every 4 (four) hours as needed for wheezing or shortness of breath.  6.7 g 1  . aspirin EC 81 MG tablet Take 1 tablet (81 mg total) by mouth daily.    . calcium carbonate (TUMS - DOSED IN MG ELEMENTAL CALCIUM) 500 MG chewable tablet Chew 1 tablet by mouth daily.     Marland Kitchen denosumab (PROLIA) 60 MG/ML SOLN injection Inject 60 mg into the skin every 6 (six) months. Administer in upper arm, thigh, or abdomen    . diltiazem (CARDIZEM CD) 120 MG 24 hr capsule Take 1 capsule (120 mg total) by mouth daily. 3 capsule   . fluticasone (FLONASE) 50 MCG/ACT nasal spray Place into both nostrils as needed for allergies or rhinitis.    Marland Kitchen HYDROcodone-acetaminophen (NORCO/VICODIN) 5-325 MG tablet Take 1 tablet by mouth every 6 (six) hours as needed.    Marland Kitchen ibuprofen (ADVIL) 800 MG tablet Take 1 tablet (800 mg total) by mouth 4 (four) times daily. 30 tablet 0  . ipratropium (ATROVENT) 0.03 % nasal spray Place 2 sprays into both nostrils 4 (four) times daily as needed for rhinitis. 30 mL 11  . nitroGLYCERIN (NITROSTAT) 0.4 MG SL tablet Place 1 tablet (0.4 mg total) under the tongue every 5 (five) minutes as needed for chest pain. 25 tablet 11  . omeprazole (PRILOSEC) 40 MG capsule Take 40 mg by mouth 2 (two) times daily.     . ondansetron (ZOFRAN-ODT) 4 MG disintegrating tablet Take 4 mg by mouth every 4 (four) hours as needed for nausea.     . polyethylene glycol (MIRALAX / GLYCOLAX) 17 g packet Take 17 g by mouth daily. 14 each 0  . Respiratory Therapy Supplies (FLUTTER) DEVI Use as directed 1 each 0  . rosuvastatin (CRESTOR) 10 MG tablet TAKE 1 TABLET 3 TIMES A WEEK AS DIRECTED (DOSE CHANGE) (Patient taking differently: Take 10 mg by mouth every Monday, Wednesday, and Friday. ) 36 tablet 3  . senna-docusate (SENOKOT-S) 8.6-50 MG tablet Take 1 tablet by mouth at bedtime.    . Vitamin D, Ergocalciferol, (DRISDOL) 50000 units CAPS capsule Take 50,000 Units by mouth every Friday.      No current facility-administered medications for this visit.    Family History  Problem Relation Age of  Onset  . Colon cancer Mother 51  . Cancer Mother   . Lung cancer Father   . Heart disease Father   . Arrhythmia Father   . Cancer Father   . Cancer Sister        ovarian  . Heart attack Brother   . Esophageal cancer Neg Hx   . Rectal cancer Neg Hx   . Stomach cancer Neg Hx   .  Stroke Neg Hx     Review of Systems  Genitourinary: Positive for frequency.  All other systems reviewed and are negative.   Exam:   BP 126/60   Pulse 83   Temp 98.3 F (36.8 C)   Ht 5' 2.75" (1.594 m)   Wt 120 lb (54.4 kg)   LMP 02/22/1983 (Approximate)   SpO2 96%   BMI 21.43 kg/m   Weight change: @WEIGHTCHANGE @ Height:   Height: 5' 2.75" (159.4 cm)  Ht Readings from Last 3 Encounters:  03/25/19 5' 2.75" (1.594 m)  01/07/19 5\' 3"  (1.6 m)  12/11/18 5\' 3"  (1.6 m)    General appearance: alert, cooperative and appears stated age Head: Normocephalic, without obvious abnormality, atraumatic Neck: no adenopathy, supple, symmetrical, trachea midline and thyroid normal to inspection and palpation Lungs: clear to auscultation bilaterally Cardiovascular: regular rate and rhythm Breasts: normal appearance, no masses or tenderness Abdomen: soft, non-tender; non distended,  no masses,  no organomegaly Extremities: extremities normal, atraumatic, no cyanosis or edema Skin: Skin color, texture, turgor normal. No rashes or lesions Lymph nodes: Cervical, supraclavicular, and axillary nodes normal. No abnormal inguinal nodes palpated Neurologic: Grossly normal   Pelvic: External genitalia:  no lesions              Urethra:  normal appearing urethra with no masses, tenderness or lesions              Bartholins and Skenes: normal                 Vagina: atrophic appearing vagina with normal color and discharge, no lesions  The vaginal opening is small. With valsalva her cervix is about one cm above the introitus. Only examined supine.               Cervix: no lesions               Bimanual Exam:  Uterus:   normal size, contour, position, consistency, mobility, non-tender              Adnexa: no mass, fullness, tenderness               Rectovaginal: Confirms               Anus:  normal sphincter tone, no lesions  Gae Dry chaperoned for the exam.  A:  Well Woman with normal exam  Uterine prolapse, mild. Not frequently bothering her. She will let me know if it becomes a problem.   Consipation  OAB, followed by Urology  Osteoporosis, followed by her primary  P:   No pap needed  Discussed breast self exam  Discussed calcium and vit D intake  Mammogram is scheduled  DEXA is later this month with her primary  Colonoscopy  UTD  Labs with primary.   Recommended that she increase her current metamucil to 2-3 x a day and her stool softener to 2 x a day. She should f/u with her primary if this doesn't help her constipation.

## 2019-03-21 ENCOUNTER — Other Ambulatory Visit: Payer: Self-pay

## 2019-03-25 ENCOUNTER — Other Ambulatory Visit: Payer: Self-pay

## 2019-03-25 ENCOUNTER — Ambulatory Visit (INDEPENDENT_AMBULATORY_CARE_PROVIDER_SITE_OTHER): Payer: Medicare Other | Admitting: Obstetrics and Gynecology

## 2019-03-25 ENCOUNTER — Encounter: Payer: Self-pay | Admitting: Obstetrics and Gynecology

## 2019-03-25 VITALS — BP 126/60 | HR 83 | Temp 98.3°F | Ht 62.75 in | Wt 120.0 lb

## 2019-03-25 DIAGNOSIS — Z01419 Encounter for gynecological examination (general) (routine) without abnormal findings: Secondary | ICD-10-CM

## 2019-03-25 DIAGNOSIS — Z124 Encounter for screening for malignant neoplasm of cervix: Secondary | ICD-10-CM | POA: Diagnosis not present

## 2019-03-25 DIAGNOSIS — N814 Uterovaginal prolapse, unspecified: Secondary | ICD-10-CM | POA: Diagnosis not present

## 2019-03-25 DIAGNOSIS — K5900 Constipation, unspecified: Secondary | ICD-10-CM | POA: Diagnosis not present

## 2019-03-25 NOTE — Patient Instructions (Signed)
EXERCISE AND DIET:  We recommended that you start or continue a regular exercise program for good health. Regular exercise means any activity that makes your heart beat faster and makes you sweat.  We recommend exercising at least 30 minutes per day at least 3 days a week, preferably 4 or 5.  We also recommend a diet low in fat and sugar.  Inactivity, poor dietary choices and obesity can cause diabetes, heart attack, stroke, and kidney damage, among others.    ALCOHOL AND SMOKING:  Women should limit their alcohol intake to no more than 7 drinks/beers/glasses of wine (combined, not each!) per week. Moderation of alcohol intake to this level decreases your risk of breast cancer and liver damage. And of course, no recreational drugs are part of a healthy lifestyle.  And absolutely no smoking or even second hand smoke. Most people know smoking can cause heart and lung diseases, but did you know it also contributes to weakening of your bones? Aging of your skin?  Yellowing of your teeth and nails?  CALCIUM AND VITAMIN D:  Adequate intake of calcium and Vitamin D are recommended.  The recommendations for exact amounts of these supplements seem to change often, but generally speaking 1,200 mg of calcium (between diet and supplement) and 800 units of Vitamin D per day seems prudent. Certain women may benefit from higher intake of Vitamin D.  If you are among these women, your doctor will have told you during your visit.    PAP SMEARS:  Pap smears, to check for cervical cancer or precancers,  have traditionally been done yearly, although recent scientific advances have shown that most women can have pap smears less often.  However, every woman still should have a physical exam from her gynecologist every year. It will include a breast check, inspection of the vulva and vagina to check for abnormal growths or skin changes, a visual exam of the cervix, and then an exam to evaluate the size and shape of the uterus and  ovaries.  And after 83 years of age, a rectal exam is indicated to check for rectal cancers. We will also provide age appropriate advice regarding health maintenance, like when you should have certain vaccines, screening for sexually transmitted diseases, bone density testing, colonoscopy, mammograms, etc.   MAMMOGRAMS:  All women over 40 years old should have a yearly mammogram. Many facilities now offer a "3D" mammogram, which may cost around $50 extra out of pocket. If possible,  we recommend you accept the option to have the 3D mammogram performed.  It both reduces the number of women who will be called back for extra views which then turn out to be normal, and it is better than the routine mammogram at detecting truly abnormal areas.    COLON CANCER SCREENING: Now recommend starting at age 45. At this time colonoscopy is not covered for routine screening until 50. There are take home tests that can be done between 45-49.   COLONOSCOPY:  Colonoscopy to screen for colon cancer is recommended for all women at age 50.  We know, you hate the idea of the prep.  We agree, BUT, having colon cancer and not knowing it is worse!!  Colon cancer so often starts as a polyp that can be seen and removed at colonscopy, which can quite literally save your life!  And if your first colonoscopy is normal and you have no family history of colon cancer, most women don't have to have it again for   10 years.  Once every ten years, you can do something that may end up saving your life, right?  We will be happy to help you get it scheduled when you are ready.  Be sure to check your insurance coverage so you understand how much it will cost.  It may be covered as a preventative service at no cost, but you should check your particular policy.      Breast Self-Awareness Breast self-awareness means being familiar with how your breasts look and feel. It involves checking your breasts regularly and reporting any changes to your  health care provider. Practicing breast self-awareness is important. A change in your breasts can be a sign of a serious medical problem. Being familiar with how your breasts look and feel allows you to find any problems early, when treatment is more likely to be successful. All women should practice breast self-awareness, including women who have had breast implants. How to do a breast self-exam One way to learn what is normal for your breasts and whether your breasts are changing is to do a breast self-exam. To do a breast self-exam: Look for Changes  1. Remove all the clothing above your waist. 2. Stand in front of a mirror in a room with good lighting. 3. Put your hands on your hips. 4. Push your hands firmly downward. 5. Compare your breasts in the mirror. Look for differences between them (asymmetry), such as: ? Differences in shape. ? Differences in size. ? Puckers, dips, and bumps in one breast and not the other. 6. Look at each breast for changes in your skin, such as: ? Redness. ? Scaly areas. 7. Look for changes in your nipples, such as: ? Discharge. ? Bleeding. ? Dimpling. ? Redness. ? A change in position. Feel for Changes Carefully feel your breasts for lumps and changes. It is best to do this while lying on your back on the floor and again while sitting or standing in the shower or tub with soapy water on your skin. Feel each breast in the following way:  Place the arm on the side of the breast you are examining above your head.  Feel your breast with the other hand.  Start in the nipple area and make  inch (2 cm) overlapping circles to feel your breast. Use the pads of your three middle fingers to do this. Apply light pressure, then medium pressure, then firm pressure. The light pressure will allow you to feel the tissue closest to the skin. The medium pressure will allow you to feel the tissue that is a little deeper. The firm pressure will allow you to feel the tissue  close to the ribs.  Continue the overlapping circles, moving downward over the breast until you feel your ribs below your breast.  Move one finger-width toward the center of the body. Continue to use the  inch (2 cm) overlapping circles to feel your breast as you move slowly up toward your collarbone.  Continue the up and down exam using all three pressures until you reach your armpit.  Write Down What You Find  Write down what is normal for each breast and any changes that you find. Keep a written record with breast changes or normal findings for each breast. By writing this information down, you do not need to depend only on memory for size, tenderness, or location. Write down where you are in your menstrual cycle, if you are still menstruating. If you are having trouble noticing differences   in your breasts, do not get discouraged. With time you will become more familiar with the variations in your breasts and more comfortable with the exam. How often should I examine my breasts? Examine your breasts every month. If you are breastfeeding, the best time to examine your breasts is after a feeding or after using a breast pump. If you menstruate, the best time to examine your breasts is 5-7 days after your period is over. During your period, your breasts are lumpier, and it may be more difficult to notice changes. When should I see my health care provider? See your health care provider if you notice:  A change in shape or size of your breasts or nipples.  A change in the skin of your breast or nipples, such as a reddened or scaly area.  Unusual discharge from your nipples.  A lump or thick area that was not there before.  Pain in your breasts.  Anything that concerns you.  

## 2019-04-29 DIAGNOSIS — M25512 Pain in left shoulder: Secondary | ICD-10-CM | POA: Insufficient documentation

## 2019-05-03 DIAGNOSIS — M12812 Other specific arthropathies, not elsewhere classified, left shoulder: Secondary | ICD-10-CM | POA: Insufficient documentation

## 2019-05-06 ENCOUNTER — Telehealth: Payer: Self-pay | Admitting: *Deleted

## 2019-05-06 NOTE — Telephone Encounter (Signed)
   Primary Cardiologist: Sherren Mocha, MD  Chart reviewed as part of pre-operative protocol coverage. Patient was contacted 05/06/2019 in reference to pre-operative risk assessment for pending surgery as outlined below.  [name] was last seen on 11/2018 by [name], PAC.  Since that day, [name] has done well. Getting >4 mets of activity.   Therefore, based on ACC/AHA guidelines, the patient would be at acceptable risk for the planned procedure without further cardiovascular testing.   Hx of CAD with moderate LCx disease by cardiac catheterization 2017.  This was not hemodynamically significant by FFR. Last  [facility] June 2020 demonstrated no ischemia.  Dr. Burt Knack, can patient hold ASA for 5-7 days prior to surgery? Please forward your response to [facility] PREOP.   Thank you      Leanor Kail, PA 05/06/2019, 12:38 PM

## 2019-05-06 NOTE — Telephone Encounter (Signed)
   [facility] Pre-operative Risk Assessment    Request for surgical clearance:  1. What type of surgery is being performed? REVERSE SHOULDER ARTHROPLASTY   2. When is this surgery scheduled? 06/13/19   3. What type of clearance is required (medical clearance vs. Pharmacy clearance to hold med vs. Both)? MEDICAL  4. Are there any medications that need to be held prior to surgery and how long? ASA   5. Practice name and name of physician performing surgery? [facility]; DR. Lennette Bihari SUPPLE   6. What is your office phone number [phone]    7.   What is your office fax number [phone] ATTN: KELLY HANKCOCK  8.   Anesthesia type (None, local, MAC, general) ? GENERAL   Julaine Hua 05/06/2019, 9:43 AM  _________________________________________________________________   (provider comments below)

## 2019-05-07 NOTE — Telephone Encounter (Signed)
Yes - OK to hold ASA 7 days prior to surgery. thanks

## 2019-05-29 NOTE — Patient Instructions (Addendum)
DUE TO COVID-19 ONLY ONE VISITOR IS ALLOWED TO COME WITH YOU AND STAY IN THE WAITING ROOM ONLY DURING PRE OP AND PROCEDURE DAY OF SURGERY. THE 1 VISITOR MAY VISIT WITH YOU AFTER SURGERY IN YOUR PRIVATE ROOM DURING VISITING HOURS ONLY!  YOU NEED TO HAVE A COVID 19 TEST ON 06-10-19 @ 11:00 AM, THIS TEST MUST BE DONE BEFORE SURGERY, COME  [place], [place] , [address].  ([place]) ONCE YOUR COVID TEST IS COMPLETED, PLEASE BEGIN THE QUARANTINE INSTRUCTIONS AS OUTLINED IN YOUR HANDOUT.                [name]  05/29/2019   Your procedure is scheduled on: 06-13-19   Report to [facility] Main  Entrance    Report to admitting at 7:20 AM     Call this number if you have problems the morning of surgery [phone]    Remember: NO SOLID FOOD AFTER MIDNIGHT THE NIGHT PRIOR TO SURGERY. NOTHING BY MOUTH EXCEPT CLEAR LIQUIDS UNTIL 6:50 AM . PLEASE FINISH ENSURE DRINK PER SURGEON ORDER  WHICH NEEDS TO BE COMPLETED AT 6:50 AM .      CLEAR LIQUID DIET   Foods Allowed                                                                     Foods Excluded  Coffee and tea, regular and decaf                             liquids that you cannot  Plain Jell-O any favor except red or purple                                           see through such as: Fruit ices (not with fruit pulp)                                     milk, soups, orange juice  Iced Popsicles                                    All solid food Carbonated beverages, regular and diet                                    Cranberry, grape and apple juices Sports drinks like Gatorade Lightly seasoned clear broth or consume(fat free) Sugar, honey syrup   _____________________________________________________________________    Take these medicines the morning of surgery with A SIP OF WATER: Bupropion (Wellbutrin), Omeprazole (Prilosec). You may also use your nasal spray and inhaler.   BRUSH YOUR TEETH  MORNING OF SURGERY AND RINSE YOUR MOUTH OUT, NO CHEWING GUM CANDY OR MINTS.  You may not have any metal on your body including hair pins and              piercings     Do not wear jewelry, make-up, lotions, powders or perfumes, deodorant              Do not wear nail polish on your fingernails.  Do not shave  48 hours prior to surgery.      Do not bring valuables to the hospital. [place].  Contacts, dentures or bridgework may not be worn into surgery.  You may bring an overnight bag     Special Instructions: N/A              Please read over the following fact sheets you were given: _____________________________________________________________________  [facility]- Preparing for Total Shoulder Arthroplasty    Before surgery, you can play an important role. Because skin is not sterile, your skin needs to be as free of germs as possible. You can reduce the number of germs on your skin by using the following products. . Benzoyl Peroxide Gel o Reduces the number of germs present on the skin o Applied twice a day to shoulder area starting two days before surgery    ==================================================================  Please follow these instructions carefully:  BENZOYL PEROXIDE 5% GEL  Please do not use if you have an allergy to benzoyl peroxide.   If your skin becomes reddened/irritated stop using the benzoyl peroxide.  Starting two days before surgery, apply as follows: 1. Apply benzoyl peroxide in the morning and at night. Apply after taking a shower. If you are not taking a shower clean entire shoulder front, back, and side along with the armpit with a clean wet washcloth.  2. Place a quarter-sized dollop on your shoulder and rub in thoroughly, making sure to cover the front, back, and side of your shoulder, along with the armpit.   2 days before ____ AM   ____ PM              1  day before ____ AM   ____ PM                         3. Do this twice a day for two days.  (Last application is the night before surgery, AFTER using the CHG soap as described below).  4. Do NOT apply benzoyl peroxide gel on the day of surgery.            [facility] - Preparing for Surgery Before surgery, you can play an important role.  Because skin is not sterile, your skin needs to be as free of germs as possible.  You can reduce the number of germs on your skin by washing with CHG (chlorahexidine gluconate) soap before surgery.  CHG is an antiseptic cleaner which kills germs and bonds with the skin to continue killing germs even after washing. Please DO NOT use if you have an allergy to CHG or antibacterial soaps.  If your skin becomes reddened/irritated stop using the CHG and inform your nurse when you arrive at Short Stay. Do not shave (including legs and underarms) for at least 48 hours prior to the first CHG shower.  You may shave your face/neck. Please follow these instructions carefully:  1.  Shower with CHG Soap the night before surgery  and the  morning of Surgery.  2.  If you choose to wash your hair, wash your hair first as usual with your  normal  shampoo.  3.  After you shampoo, rinse your hair and body thoroughly to remove the  shampoo.                           4.  Use CHG as you would any other liquid soap.  You can apply chg directly  to the skin and wash                       Gently with a scrungie or clean washcloth.  5.  Apply the CHG Soap to your body ONLY FROM THE NECK DOWN.   Do not use on face/ open                           Wound or open sores. Avoid contact with eyes, ears mouth and genitals (private parts).                       Wash face,  Genitals (private parts) with your normal soap.             6.  Wash thoroughly, paying special attention to the area where your surgery  will be performed.  7.  Thoroughly rinse your body with warm water from the neck down.  8.   DO NOT shower/wash with your normal soap after using and rinsing off  the CHG Soap.                9.  Pat yourself dry with a clean towel.            10.  Wear clean pajamas.            11.  Place clean sheets on your bed the night of your first shower and do not  sleep with pets. Day of Surgery : Do not apply any lotions/deodorants the morning of surgery.  Please wear clean clothes to the hospital/surgery center.  FAILURE TO FOLLOW THESE INSTRUCTIONS MAY RESULT IN THE CANCELLATION OF YOUR SURGERY PATIENT SIGNATURE_________________________________  NURSE SIGNATURE__________________________________  ________________________________________________________________________   Adam Phenix  An incentive spirometer is a tool that can help keep your lungs clear and active. This tool measures how well you are filling your lungs with each breath. Taking long deep breaths may help reverse or decrease the chance of developing breathing (pulmonary) problems (especially infection) following:  A long period of time when you are unable to move or be active. BEFORE THE PROCEDURE   If the spirometer includes an indicator to show your best effort, your nurse or respiratory therapist will set it to a desired goal.  If possible, sit up straight or lean slightly forward. Try not to slouch.  Hold the incentive spirometer in an upright position. INSTRUCTIONS FOR USE  1. Sit on the edge of your bed if possible, or sit up as far as you can in bed or on a chair. 2. Hold the incentive spirometer in an upright position. 3. Breathe out normally. 4. Place the mouthpiece in your mouth and seal your lips tightly around it. 5. Breathe in slowly and as deeply as possible, raising the piston or the ball toward the top of the column. 6. Hold your breath for 3-5 seconds or for as  long as possible. Allow the piston or ball to fall to the bottom of the column. 7. Remove the mouthpiece from your mouth and  breathe out normally. 8. Rest for a few seconds and repeat Steps 1 through 7 at least 10 times every 1-2 hours when you are awake. Take your time and take a few normal breaths between deep breaths. 9. The spirometer may include an indicator to show your best effort. Use the indicator as a goal to work toward during each repetition. 10. After each set of 10 deep breaths, practice coughing to be sure your lungs are clear. If you have an incision (the cut made at the time of surgery), support your incision when coughing by placing a pillow or rolled up towels firmly against it. Once you are able to get out of bed, walk around indoors and cough well. You may stop using the incentive spirometer when instructed by your caregiver.  RISKS AND COMPLICATIONS  Take your time so you do not get dizzy or light-headed.  If you are in pain, you may need to take or ask for pain medication before doing incentive spirometry. It is harder to take a deep breath if you are having pain. AFTER USE  Rest and breathe slowly and easily.  It can be helpful to keep track of a log of your progress. Your caregiver can provide you with a simple table to help with this. If you are using the spirometer at home, follow these instructions: [place] IF:   You are having difficultly using the spirometer.  You have trouble using the spirometer as often as instructed.  Your pain medication is not giving enough relief while using the spirometer.  You develop fever of 100.5 F (38.1 C) or higher. SEEK IMMEDIATE MEDICAL CARE IF:   You cough up bloody sputum that had not been present before.  You develop fever of 102 F (38.9 C) or greater.  You develop worsening pain at or near the incision site. MAKE SURE YOU:   Understand these instructions.  Will watch your condition.  Will get help right away if you are not doing well or get worse. Document Released: 06/20/2006 Document Revised: 05/02/2011 Document  Reviewed: 08/21/2006 [facility] Patient Information 2014 [place], [place].

## 2019-05-29 NOTE — Progress Notes (Signed)
PCP - [name], MD  Cardiologist - Sherren Mocha w.clearance dated 05-06-19  Pulmonologist -Dr. Janese Banks 02/01/19 f/u in 72months  Chest x-ray - 01-22-19 Stress EKG-11-28-18 ECHO - 08-02-18  Cardiac Cath -   Sleep Study -  CPAP -   Fasting Blood Sugar -  Checks Blood Sugar _____ times a day  Blood Thinner Instructions:  Aspirin Instructions: 81 mg can hold x 7 days per Dr. Burt Knack  Last Dose:  Anesthesia review: Hx of Pulmonary Sacrdosis, CAD, HTN  Patient denies shortness of breath, fever, cough and chest pain at PAT appointment   Patient verbalized understanding of instructions that were given to them at the PAT appointment. Patient was also instructed that they will need to review over the PAT instructions again at home before surgery.

## 2019-05-30 ENCOUNTER — Telehealth: Payer: Self-pay | Admitting: Emergency Medicine

## 2019-05-30 NOTE — Telephone Encounter (Signed)
Spoke with pt, she is having yellow phelgm coming up and the inside of her nose is crusty. She is having shoulder surgery and she wants to get this treated as soon as possible. She does want to come in for an appointment but there are no availability. She denies fever but she is coughing and it feels like something is in her throat. She doesn't know if its her allergies and restarted her Zyrtec. She would like to know if something can be called in for sinus infection. Dr. [name] please advise.

## 2019-05-30 NOTE — Progress Notes (Signed)
Cardiology Office Note:    Date:  05/31/2019   ID:  [name], DOB Apr 03, 1934, MRN [id number]  PCP:  [name], MD  Cardiologist:  Sherren Mocha, MD  Electrophysiologist:  None   Referring MD: [name], MD   Chief Complaint:  Follow-up (CAD)    Patient Profile:    [name] is a 85 y.o. female with:   Coronary artery disease ? Cath 3/17: oLCx 60-70 (neg by FFR) ? Myoview 11/16: no ischemia ? Myoview 07/2018: EF 73, prob low risk (significant extracardiac uptake), no ischemia  Pulmonary sarcoidosis  Hypertension   Hyperlipidemia   SVT  Carotid stenosis ? [place] 06/2018: bilat 1-39  Prior CV studies: Myoview 08/02/2018 EF: 73%, sig extracardiac uptake at rest, prob low risk  Carotid US 07/09/2018 Bilat ICA 1-39  Carotid US 10/17 bilat ICA 1-39>> FUprn  LHC 05/20/15 LAD plaque - nonobstructive LCx ost 70% >> FFR 0.99 > 0.95 (not hemodynamically significant); mid 40% RCA mid 30% EF 55-65%  Myoview 11/16 EF 75%, no ischemia, low risk  High Resolution CT 12/02/14 IMPRESSION: 1. No findings to suggest interstitial lung disease. 2. The overall appearance the chest is very similar to the prior study 11/28/2012. Findings are nonspecific, but could be seen in the setting of reported sarcoidosis, as detailed above. 3. Areas of bronchiectasis are noted in the right lung, most severe in the medial segment of the right middle lobe where there are areas of cystic bronchiectasis and architectural distortion. 4. Atherosclerosis, including left main and 3 vessel coronary artery disease. Assessment for potential risk factor modification, dietary therapy or pharmacologic therapy may be warranted, if clinically indicated. 5. Additional incidental findings, as above.  Myoview 10/15 Normal stress nuclear study.LV Ejection Fraction: 70%.  Echo 10/15 EF 60-65%, no RWMA  Carotid US 9/15 Bilateral 1-39% ICA >> FU 2 years   History of Present  Illness:    [name] was last seen in 11/2018.  She returns for follow up.  She has shoulder surgery scheduled for 06/13/2019 with Dr. Onnie Graham.  She returns for follow-up.  She is here alone.  She fell in October 2020 and suffered a sacral fracture.  She had to spend some time in rehab after discharge.  In the hospital, she did have some bradycardia and her diltiazem was changed to 120 mg daily.  After she returned home she went back to 180 mg daily.  She has not had chest discomfort.  She has not had significant shortness of breath.  She has not had orthopnea, leg swelling or syncope.     Past Medical History:  Diagnosis Date  . Allergy    SEASONAL  . Arthritis   . Asthma    "slight"   . Baker's cyst of knee, right   . CAD (coronary artery disease)    a. Myoview 10/15 - normal EF 70% // Myoview 11/16: EF 75%, normal perfusion, (there was no blood pressure drop at low level exercise with Lexiscan infusion), low risk study // c. LHC 3/17 - LAD irregs, oLCx 70 (neg FFR), mRCA 30, EF 55-65% >> med Rx // Myoview 07/2018:  EF 73, extracardiac uptake, no significant ischemia (reviewed with Dr. Burt Knack), Low Risk   . Carotid stenosis    a. Carotid US 9/15 - Bilateral 1-39% ICA >> FU 2 years // b. Bilateral ICA 1-39 >> FU prn // Carotid US 06/2018: bilat 1-39; fu prn  . Dyspnea    due to Sarcoidosis  . Elbow  fracture 04/2017   Right, had surgery  . Esophageal reflux   . Hematochezia   . History of echocardiogram    a. Echo 10/15 - EF 60-65%, no RWMA  . HLD (hyperlipidemia)   . Hx of colonoscopy   . Osteoporosis   . Other chronic pulmonary heart diseases   . Paroxysmal supraventricular tachycardia ([place])   . Pneumonia   . Sarcoidosis     Current Medications: Current Meds  Medication Sig  . acetaminophen (TYLENOL) 500 MG tablet Take 1,000 mg by mouth every 6 (six) hours as needed for mild pain.   Marland Kitchen albuterol (VENTOLIN HFA) 108 (90 Base) MCG/ACT inhaler Inhale 2 puffs into the lungs every 4  (four) hours as needed for wheezing or shortness of breath.  Marland Kitchen amoxicillin-clavulanate (AUGMENTIN) 875-125 MG tablet Take 1 tablet by mouth 2 (two) times daily.  Marland Kitchen aspirin EC 81 MG tablet Take 1 tablet (81 mg total) by mouth daily.  Marland Kitchen buPROPion (WELLBUTRIN) 75 MG tablet Take 75 mg by mouth every morning.  . calcium carbonate (TUMS - DOSED IN MG ELEMENTAL CALCIUM) 500 MG chewable tablet Chew 1 tablet by mouth daily.   Marland Kitchen denosumab (PROLIA) 60 MG/ML SOLN injection Inject 60 mg into the skin every 6 (six) months. Administer in upper arm, thigh, or abdomen  . diltiazem (CARDIZEM CD) 180 MG 24 hr capsule Take 180 mg by mouth daily.  . fluticasone (FLONASE) 50 MCG/ACT nasal spray Place 2 sprays into both nostrils 2 (two) times daily.  Marland Kitchen HYDROcodone-acetaminophen (NORCO/VICODIN) 5-325 MG tablet Take 1 tablet by mouth every 6 (six) hours as needed for severe pain.   Marland Kitchen ipratropium (ATROVENT) 0.03 % nasal spray Place 2 sprays into both nostrils 4 (four) times daily as needed for rhinitis.  Marland Kitchen nitroGLYCERIN (NITROSTAT) 0.4 MG SL tablet Place 1 tablet (0.4 mg total) under the tongue every 5 (five) minutes as needed for chest pain.  Marland Kitchen omeprazole (PRILOSEC) 40 MG capsule Take 40 mg by mouth 2 (two) times daily.   . ondansetron (ZOFRAN-ODT) 4 MG disintegrating tablet Take 4 mg by mouth every 4 (four) hours as needed for nausea.   . psyllium (METAMUCIL) 58.6 % powder Take 1 packet by mouth daily.  Marland Kitchen Respiratory Therapy Supplies (FLUTTER) DEVI Use as directed  . rosuvastatin (CRESTOR) 10 MG tablet Take 10 mg by mouth 3 (three) times a week.  . senna-docusate (SENOKOT-S) 8.6-50 MG tablet Take 1 tablet by mouth at bedtime.  . solifenacin (VESICARE) 5 MG tablet Take 5 mg by mouth daily.  . Vitamin D, Ergocalciferol, (DRISDOL) 50000 units CAPS capsule Take 50,000 Units by mouth every Friday.      Allergies:   Levaquin [levofloxacin], Sulfonamide derivatives, Morphine, Oxycodone-acetaminophen, and Percocet  [oxycodone-acetaminophen]   Social History   Tobacco Use  . Smoking status: Never Smoker  . Smokeless tobacco: Never Used  Substance Use Topics  . Alcohol use: No  . Drug use: No     Family Hx: The patient's family history includes Arrhythmia in her father; Cancer in her father, mother, and sister; Colon cancer (age of onset: 67) in her mother; Heart attack in her brother; Heart disease in her father; Lung cancer in her father. There is no history of Esophageal cancer, Rectal cancer, Stomach cancer, or Stroke.  ROS   EKGs/Labs/Other Test Reviewed:    EKG:  EKG is  ordered today.  The ekg ordered today demonstrates normal sinus rhythm, heart rate 63, normal axis, no ST-T wave changes, QTC 450  Recent Labs: 12/13/2018: ALT 13 12/15/2018: BUN 11; Creatinine, Ser 0.54; Hemoglobin 11.4; Platelets 187; Potassium 4.3; Sodium 136   Recent Lipid Panel Lab Results  Component Value Date/Time   CHOL 149 11/27/2017 08:00 AM   TRIG 67 11/27/2017 08:00 AM   HDL 73 11/27/2017 08:00 AM   CHOLHDL 2.0 11/27/2017 08:00 AM   CHOLHDL 2.0 11/03/2015 07:42 AM   LDLCALC 63 11/27/2017 08:00 AM     Physical Exam:    VS:  BP (!) 118/50   Pulse 63   Ht 5\' 2"  (1.575 m)   Wt 119 lb 12.8 oz (54.3 kg)   LMP 02/22/1983 (Approximate)   SpO2 94%   BMI 21.91 kg/m     Wt Readings from Last 3 Encounters:  05/31/19 119 lb 12.8 oz (54.3 kg)  03/25/19 120 lb (54.4 kg)  01/07/19 127 lb (57.6 kg)     Constitutional:      Appearance: Healthy appearance. Not in distress.  Neck:     Thyroid: No thyromegaly.     Vascular: JVD normal.  Pulmonary:     Breath sounds: No wheezing. No rales.     Comments: Dry crackles in the bases Cardiovascular:     Normal rate. Regular rhythm. Normal S1. Normal S2.     Murmurs: There is no murmur.  Edema:    Peripheral edema absent.  Abdominal:     Palpations: Abdomen is soft. There is no hepatomegaly.  Skin:    General: Skin is cool and dry.  Neurological:      General: No focal deficit present.     Mental Status: Alert and oriented to person, place and time.      ASSESSMENT & PLAN:    1. Coronary artery disease involving native coronary artery of native heart without angina pectoris Moderate LCx disease by cardiac catheterization 2017.  This was not hemodynamically significant by FFR.  Lexiscan Myoview June 2020 demonstrated no ischemia.  She is doing well without anginal symptoms.  Continue aspirin, statin therapy.  2. Essential hypertension The patient's blood pressure is controlled on her current regimen.  Continue current therapy.   3. Mixed hyperlipidemia LDL optimal on most recent lab work.  Continue current Rx.    4. Preoperative cardiovascular examination Her risk of perioperative major cardiac event is low at 0.4% according to the revised cardiac risk index (RCRI).  She is clearly able to achieve >4 METs without difficulty.  According to [facility] and AHA guidelines, she may proceed with her noncardiac procedure at acceptable risk without further testing.    Dispo:  Return in about 6 months (around 11/30/2019) for Routine Follow Up, w/ [name], PA-C, in person.   Medication Adjustments/Labs and Tests Ordered: Current medicines are reviewed at length with the patient today.  Concerns regarding medicines are outlined above.  Tests Ordered: Orders Placed This Encounter  Procedures  . EKG 12-Lead   Medication Changes: No orders of the defined types were placed in this encounter.   Signed, [name], PA-C  05/31/2019 12:39 PM    [facility] [address], [place], [place]  [address] Phone: [phone]; Fax: [phone]

## 2019-05-30 NOTE — Telephone Encounter (Signed)
Spoke with patient  Shared RBs recommendations Patient voiced understanding appt made with BPM for 05/31/19 at 1030 televisit.   Nothing further needed at this time

## 2019-05-30 NOTE — Telephone Encounter (Signed)
Agree with the Zyrtec. Have her start fluticasone nasal spray 2 sprays each side qd (if not already doing). I would prefer for her to be seen or have a phone visit to decide whether she needs abx.

## 2019-05-31 ENCOUNTER — Encounter: Payer: Self-pay | Admitting: Physician Assistant

## 2019-05-31 ENCOUNTER — Ambulatory Visit (INDEPENDENT_AMBULATORY_CARE_PROVIDER_SITE_OTHER): Payer: Medicare Other | Admitting: Pulmonary Disease

## 2019-05-31 ENCOUNTER — Ambulatory Visit (INDEPENDENT_AMBULATORY_CARE_PROVIDER_SITE_OTHER): Payer: Medicare Other | Admitting: Physician Assistant

## 2019-05-31 ENCOUNTER — Other Ambulatory Visit: Payer: Self-pay

## 2019-05-31 ENCOUNTER — Encounter: Payer: Self-pay | Admitting: Pulmonary Disease

## 2019-05-31 VITALS — BP 118/50 | HR 63 | Ht 62.0 in | Wt 119.8 lb

## 2019-05-31 DIAGNOSIS — J0100 Acute maxillary sinusitis, unspecified: Secondary | ICD-10-CM

## 2019-05-31 DIAGNOSIS — Z01810 Encounter for preprocedural cardiovascular examination: Secondary | ICD-10-CM

## 2019-05-31 DIAGNOSIS — J309 Allergic rhinitis, unspecified: Secondary | ICD-10-CM | POA: Diagnosis not present

## 2019-05-31 DIAGNOSIS — J479 Bronchiectasis, uncomplicated: Secondary | ICD-10-CM

## 2019-05-31 DIAGNOSIS — I2510 Atherosclerotic heart disease of native coronary artery without angina pectoris: Secondary | ICD-10-CM

## 2019-05-31 DIAGNOSIS — D869 Sarcoidosis, unspecified: Secondary | ICD-10-CM

## 2019-05-31 DIAGNOSIS — I10 Essential (primary) hypertension: Secondary | ICD-10-CM | POA: Diagnosis not present

## 2019-05-31 DIAGNOSIS — E782 Mixed hyperlipidemia: Secondary | ICD-10-CM | POA: Diagnosis not present

## 2019-05-31 DIAGNOSIS — J301 Allergic rhinitis due to pollen: Secondary | ICD-10-CM

## 2019-05-31 MED ORDER — DOXYCYCLINE HYCLATE 100 MG PO TABS: 100.0000 mg | ORAL_TABLET | Freq: Two times a day (BID) | ORAL | 0 refills | Status: DC

## 2019-05-31 MED ORDER — FLUTICASONE PROPIONATE 50 MCG/ACT NA SUSP: 2.0000 | Freq: Two times a day (BID) | NASAL | 3 refills | Status: DC

## 2019-05-31 MED ORDER — IPRATROPIUM BROMIDE 0.03 % NA SOLN: 2.0000 | Freq: Four times a day (QID) | NASAL | 11 refills | Status: DC | PRN

## 2019-05-31 MED ORDER — AMOXICILLIN-POT CLAVULANATE 875-125 MG PO TABS: 1.0000 | ORAL_TABLET | Freq: Two times a day (BID) | ORAL | 0 refills | Status: DC

## 2019-05-31 NOTE — Patient Instructions (Signed)
Medication Instructions:  Your physician recommends that you continue on your current medications as directed. Please refer to the Current Medication list given to you today.  *If you need a refill on your cardiac medications before your next appointment, please call your pharmacy*  Lab Work: None ordered today  Testing/Procedures: None ordered today  Follow-Up: At [facility], you and your health needs are our priority.  As part of our continuing mission to provide you with exceptional heart care, we have created designated Provider Care Teams.  These Care Teams include your primary Cardiologist (physician) and Advanced Practice Providers (APPs -  Physician Assistants and Nurse Practitioners) who all work together to provide you with the care you need, when you need it.  We recommend signing up for the patient portal called "MyChart".  Sign up information is provided on this After Visit Summary.  MyChart is used to connect with patients for Virtual Visits (Telemedicine).  Patients are able to view lab/test results, encounter notes, upcoming appointments, etc.  Non-urgent messages can be sent to your provider as well.   To learn more about what you can do with MyChart, go to [email or web address].    Your next appointment:   6 month(s)  The format for your next appointment:   In Person  Provider:   [name] Weaver, PA-C   

## 2019-05-31 NOTE — Assessment & Plan Note (Signed)
2 weeks of increased nasal drainage Discolored yellow nasal drainage Occasional bloody drainage Sinus tenderness Patient with known history of allergic rhinitis Off all nasal medications  Plan: Start nasal saline rinses twice daily prior to nasal medications Refill Flonase Refill Atrovent Doxycycline today due to length of symptoms, sinus tenderness and bloody drainage

## 2019-05-31 NOTE — Patient Instructions (Signed)
You were seen today by Lauraine Rinne, NP  for:   1. Bronchiectasis without complication (HCC)  Bronchiectasis: This is the medical term which indicates that you have damage, dilated airways making you more susceptible to respiratory infection. Use a flutter valve 10 breaths twice a day or 4 to 5 breaths 4-5 times a day to help clear mucus out Let us know if you have cough with change in mucus color or fevers or chills.  At that point you would need an antibiotic. Maintain a healthy nutritious diet, eating whole foods Take your medications as prescribed    2. Allergic rhinitis due to pollen, unspecified seasonality  - doxycycline (VIBRA-TABS) 100 MG tablet; Take 1 tablet (100 mg total) by mouth 2 (two) times daily.  Dispense: 14 tablet; Refill: 0 - fluticasone (FLONASE) 50 MCG/ACT nasal spray; Place 2 sprays into both nostrils 2 (two) times daily.  Dispense: 16 g; Refill: 3 - ipratropium (ATROVENT) 0.03 % nasal spray; Place 2 sprays into both nostrils 4 (four) times daily as needed for rhinitis.  Dispense: 30 mL; Refill: 11  Start nasal saline rinses twice daily prior to nasal medications  3. Acute non-recurrent maxillary sinusitis  - fluticasone (FLONASE) 50 MCG/ACT nasal spray; Place 2 sprays into both nostrils 2 (two) times daily.  Dispense: 16 g; Refill: 3 - ipratropium (ATROVENT) 0.03 % nasal spray; Place 2 sprays into both nostrils 4 (four) times daily as needed for rhinitis.  Dispense: 30 mL; Refill: 11  4. SARCOIDOSIS, PULMONARY   We recommend today:  No orders of the defined types were placed in this encounter.  No orders of the defined types were placed in this encounter.  Meds ordered this encounter  Medications  . doxycycline (VIBRA-TABS) 100 MG tablet    Sig: Take 1 tablet (100 mg total) by mouth 2 (two) times daily.    Dispense:  14 tablet    Refill:  0  . fluticasone (FLONASE) 50 MCG/ACT nasal spray    Sig: Place 2 sprays into both nostrils 2 (two) times daily.    Dispense:  16 g    Refill:  3  . ipratropium (ATROVENT) 0.03 % nasal spray    Sig: Place 2 sprays into both nostrils 4 (four) times daily as needed for rhinitis.    Dispense:  30 mL    Refill:  11    Follow Up:    Return in about 2 weeks (around 06/14/2019), or if symptoms worsen or fail to improve, for Follow up with Dr. [name], Follow up with Wyn Quaker FNP-C.   Please do your part to reduce the spread of COVID-19:      Reduce your risk of any infection  and COVID19 by using the similar precautions used for avoiding the common cold or flu:  Marland Kitchen Wash your hands often with soap and warm water for at least 20 seconds.  If soap and water are not readily available, use an alcohol-based hand sanitizer with at least 60% alcohol.  . If coughing or sneezing, cover your mouth and nose by coughing or sneezing into the elbow areas of your shirt or coat, into a tissue or into your sleeve (not your hands). Langley Gauss A MASK when in public  . Avoid shaking hands with others and consider head nods or verbal greetings only. . Avoid touching your eyes, nose, or mouth with unwashed hands.  . Avoid close contact with people who are sick. . Avoid places or events with large numbers of  people in one location, like concerts or sporting events. . If you have some symptoms but not all symptoms, continue to monitor at home and seek medical attention if your symptoms worsen. . If you are having a medical emergency, call 911.   [place] / e-Visit: [email or web address]         [facility]: [facility]: [id number]                   [facility]: [id number]     It is flu season:   >>> Best ways to protect herself from the flu: Receive the yearly flu vaccine, practice good hand hygiene washing with soap and also using hand sanitizer when available, eat a  nutritious meals, get adequate rest, hydrate appropriately   Please contact the office if your symptoms worsen or you have concerns that you are not improving.   Thank you for choosing [facility] for your healthcare, and for allowing [place] to partner with you on your healthcare journey. I am thankful to be able to provide care to you today.   Wyn Quaker FNP-C

## 2019-05-31 NOTE — Assessment & Plan Note (Signed)
Plan: Continue flutter valve Doxycycline today although do not believe patients with a bronchiectatic exacerbation, this is for sinusitis

## 2019-05-31 NOTE — Progress Notes (Addendum)
Virtual Visit via Telephone Note  I connected with [name] on 05/31/19 at 10:30 AM EDT by telephone and verified that I am speaking with the correct person using two identifiers.  Location: Patient: Home Provider: [facility] - [id number], [place], [place] [address]   I discussed the limitations, risks, security and privacy concerns of performing an evaluation and management service by telephone and the availability of in person appointments. I also discussed with the patient that there may be a patient responsible charge related to this service. The patient expressed understanding and agreed to proceed.  Patient consented to consult via telephone: Yes People present and their role in pt care: Pt     History of Present Illness:  85 year old female never smoker followed in our office for bronchiectasis  Past medical history: CAD, hypertension, sarcoidosis, hyperlipidemia, GERD, scoliosis Smoking history: Never smoker Maintenance: none Patient of Dr. [name]  Chief complaint: nasal congestion    85 year old female never smoker followed in our office for bronchiectasis.  Patient was last seen telephonically in December/2020.  At that time it was recommended that she remain on Flonase, Atrovent nasal sprays as needed.  As well as omeprazole 40 mg twice daily.  She is followed by Dr. [name] for sarcoidosis with obstructive lung disease as well as bronchiectasis.  Unfortunately she had a pelvic fracture and went to [facility] and was diagnosed with pneumonia there which improved after 10 days of antibiotics in October/2020.  She had a CT chest in December/2020 that showed no evidence to support active sarcoidosis.  Some mediastinal nodular calcifications and stable right lower lobe bandlike linear opacity.  Some mild bronchiectasis that is stable.  It was recommended that she follow-up in 6 months and continue her medications as prescribed.  Patient  contacted our office on 05/30/2019 reporting that she is having yellow phlegm coming up and inside her nose is crusty.  She is also having shoulder surgery and would like to get this treated soon as possible.  She denies fever but is coughing and sometimes it feels like something is in her throat.  Dr. [name] recommended Zyrtec as well as fluticasone nasal sprays.  May need to decide antibiotics which is why patient was scheduled for televisit.  Patient completing televisit today.  She is reporting that she is had symptoms for about 2 weeks now.  Started with simple allergies nasal congestion.  She is ran out of her nasal medications.  We will address this today.  Patient is not using nasal saline rinses.  Patient admits that she is having significant nasal congestion, also having bloody drainage from her nose.  She does feel that sinuses are more tender.  Patient continues to use her flutter valve.  Patient has not had any recent antibiotics.  Patient reports that she is planning on having surgery on 06/13/2019.  We will discuss this today.  Observations/Objective:  Social History   Tobacco Use  Smoking Status Never Smoker  Smokeless Tobacco Never Used   Immunization History  Administered Date(s) Administered  . Influenza Inj Mdck Quad Pf 11/05/2018  . Influenza, High Dose Seasonal PF 11/02/2016, 11/04/2017  . Influenza,inj,quad, With Preservative 11/26/2018  . Influenza-Unspecified 11/05/2013, 11/06/2014  . Pneumococcal Conjugate-13 02/25/2014  . Pneumococcal Polysaccharide-23 05/03/2001  . Tdap 01/14/2012  . Zoster 11/08/2013      Assessment and Plan:  Allergic rhinitis 2 weeks of increased nasal drainage Discolored yellow nasal drainage Occasional bloody drainage Sinus tenderness Patient  with known history of allergic rhinitis Off all nasal medications  Plan: Start nasal saline rinses twice daily prior to nasal medications Refill Flonase Refill Atrovent Doxycycline today  due to length of symptoms, sinus tenderness and bloody drainage  Bronchiectasis without complication (HCC) Plan: Continue flutter valve Doxycycline today although do not believe patients with a bronchiectatic exacerbation, this is for sinusitis   SARCOIDOSIS, PULMONARY Plan: Clinically stable right now  Addendum: 05/31/2019 10:56 AM  Received drug change request from [facility].  Patient's antibiotic of doxycycline is not covered.  Will treat patient with Augmentin.  Follow Up Instructions:  Return in about 2 weeks (around 06/14/2019), or if symptoms worsen or fail to improve, for Follow up with Dr. [name], Follow up with Wyn Quaker FNP-C.   I discussed the assessment and treatment plan with the patient. The patient was provided an opportunity to ask questions and all were answered. The patient agreed with the plan and demonstrated an understanding of the instructions.   The patient was advised to call back or seek an in-person evaluation if the symptoms worsen or if the condition fails to improve as anticipated.  I provided 23 minutes of non-face-to-face time during this encounter.   Lauraine Rinne, NP

## 2019-05-31 NOTE — Assessment & Plan Note (Signed)
Plan: Clinically stable right now

## 2019-05-31 NOTE — Addendum Note (Signed)
Addended by: Lauraine Rinne on: 05/31/2019 10:57 AM   Modules accepted: Orders

## 2019-06-05 ENCOUNTER — Encounter (HOSPITAL_COMMUNITY): Payer: Medicare Other

## 2019-06-05 ENCOUNTER — Encounter (HOSPITAL_COMMUNITY): Payer: Self-pay

## 2019-06-05 ENCOUNTER — Encounter (HOSPITAL_COMMUNITY)
Admission: RE | Admit: 2019-06-05 | Discharge: 2019-06-05 | Disposition: A | Discharge status: Home / Self Care | Payer: Medicare Other | Source: Ambulatory Visit | Attending: Orthopedic Surgery | Admitting: Orthopedic Surgery

## 2019-06-05 ENCOUNTER — Other Ambulatory Visit: Payer: Self-pay

## 2019-06-05 DIAGNOSIS — Z01812 Encounter for preprocedural laboratory examination: Secondary | ICD-10-CM | POA: Insufficient documentation

## 2019-06-05 LAB — BASIC METABOLIC PANEL
Anion gap: 9 (ref 5–15)
BUN: 24 mg/dL — ABNORMAL HIGH (ref 8–23)
CO2: 25 mmol/L (ref 22–32)
Calcium: 9.3 mg/dL (ref 8.9–10.3)
Chloride: 105 mmol/L (ref 98–111)
Creatinine, Ser: 0.7 mg/dL (ref 0.44–1.00)
GFR calc Af Amer: >60 mL/min (ref >60)
GFR calc non Af Amer: >60 mL/min (ref >60)
Glucose, Bld: 133 mg/dL — ABNORMAL HIGH (ref 70–99)
Potassium: 4.4 mmol/L (ref 3.5–5.1)
Sodium: 139 mmol/L (ref 135–145)

## 2019-06-05 LAB — CBC
HCT: 37.7 % (ref 36.0–46.0)
Hemoglobin: 12.1 g/dL (ref 12.0–15.0)
MCH: 32.9 pg (ref 26.0–34.0)
MCHC: 32.1 g/dL (ref 30.0–36.0)
MCV: 102.4 fL — ABNORMAL HIGH (ref 80.0–100.0)
Platelets: 257 10*3/uL (ref 150–400)
RBC: 3.68 MIL/uL — ABNORMAL LOW (ref 3.87–5.11)
RDW: 15.4 % (ref 11.5–15.5)
WBC: 10.5 10*3/uL (ref 4.0–10.5)
nRBC: 0 % (ref 0.0–0.2)

## 2019-06-05 LAB — SURGICAL PCR SCREEN
MRSA, PCR: NEGATIVE
Staphylococcus aureus: NEGATIVE

## 2019-06-10 ENCOUNTER — Other Ambulatory Visit (HOSPITAL_COMMUNITY)
Admission: RE | Admit: 2019-06-10 | Discharge: 2019-06-10 | Disposition: A | Discharge status: Home / Self Care | Payer: Medicare Other | Source: Ambulatory Visit | Attending: Orthopedic Surgery | Admitting: Orthopedic Surgery

## 2019-06-10 DIAGNOSIS — Z20822 Contact with and (suspected) exposure to covid-19: Secondary | ICD-10-CM | POA: Diagnosis not present

## 2019-06-10 DIAGNOSIS — Z01812 Encounter for preprocedural laboratory examination: Secondary | ICD-10-CM | POA: Insufficient documentation

## 2019-06-10 LAB — SARS CORONAVIRUS 2 (TAT 6-24 HRS): SARS Coronavirus 2: NEGATIVE

## 2019-06-10 NOTE — Progress Notes (Signed)
@Patient  ID: [name], female    DOB: August 22, 1934, 85 y.o.   MRN: [id number]  Chief Complaint  Patient presents with  . Follow-up    2wk f/u. Will have shoulder replacement surgery on Thursday.     Referring provider: [name], MD  HPI:  85 year old female never smoker followed in our office for bronchiectasis  Past medical history: CAD, hypertension, sarcoidosis, hyperlipidemia, GERD, scoliosis Smoking history: Never smoker Maintenance: none Patient of Dr. [name]  06/11/2019  - Visit   85 year old female never smoker followed in our office for bronchiectasis.  Patient completing 2-week follow-up from our office after being treated telephonically with Augmentin for suspected sinusitis.  Patient also was encouraged to resume allergic rhinitis regimen.  Patient following up with our office today in person to further assess symptoms.  Patient reporting that since being treated with doxycycline and resuming nasal medications her symptoms have improved significantly.  She is planning on having left shoulder surgery later on this week.  She occasionally has a cough in the morning with clear mucus.  She reports this is lessened as well as the color has improved since being treated with doxycycline.  She also has improved sinus symptoms since resuming her fluticasone and ipratropium nasal sprays.  Patient does have a flutter valve but she would like to review today how to use it.  We will discuss.  Tests:   01/22/2019-CT chest high-res-new mild patchy groundglass opacity in left upper and left lower lobes with broad differential most likely inflammatory and uncertain clinical significance, no consolidative airspace disease, new trace dependent left pleural effusion, stable mild to moderate cylindrical varicoid bronchiectasis in both lungs, most prominent in right middle lobe, small calcified mediastinal and bilateral hilar nodes are unchanged and compatible with reported history of  sarcoidosis  03/20/2017-split-night sleep study-mild obstructive sleep apnea with AHI of 9.4   FENO:  No results found for: NITRICOXIDE  PFT: PFT Results Latest Ref Rng & Units 11/27/2013  FVC-Pre L 1.89  FVC-Predicted Pre % 73  FVC-Post L 2.01  FVC-Predicted Post % 77  Pre FEV1/FVC % % 54  Post FEV1/FCV % % 54  FEV1-Pre L 1.02  FEV1-Predicted Pre % 52  FEV1-Post L 1.09  DLCO UNC% % 41  DLCO COR %Predicted % 69  TLC L 4.22  TLC % Predicted % 86  RV % Predicted % 95    WALK:  SIX MIN WALK 12/26/2013  Supplimental Oxygen during Test? (L/min) No  Tech Comments: No complaints. 26 seconds resting back to 96%    Imaging: No results found.  Lab Results:  CBC    Component Value Date/Time   WBC 10.5 06/05/2019 1510   RBC 3.68 (L) 06/05/2019 1510   HGB 12.1 06/05/2019 1510   HCT 37.7 06/05/2019 1510   PLT 257 06/05/2019 1510   MCV 102.4 (H) 06/05/2019 1510   MCH 32.9 06/05/2019 1510   MCHC 32.1 06/05/2019 1510   RDW 15.4 06/05/2019 1510   LYMPHSABS 0.8 12/15/2018 0325   MONOABS 0.8 12/15/2018 0325   EOSABS 0.2 12/15/2018 0325   BASOSABS 0.0 12/15/2018 0325    BMET    Component Value Date/Time   NA 139 06/05/2019 1510   NA 140 11/02/2017 0923   K 4.4 06/05/2019 1510   CL 105 06/05/2019 1510   CO2 25 06/05/2019 1510   GLUCOSE 133 (H) 06/05/2019 1510   BUN 24 (H) 06/05/2019 1510   BUN 22 11/02/2017 0923   CREATININE 0.70 06/05/2019 1510  CREATININE 0.85 05/18/2015 1028   CALCIUM 9.3 06/05/2019 1510   GFRNONAA >60 06/05/2019 1510   GFRAA >60 06/05/2019 1510    BNP No results found for: BNP  ProBNP No results found for: PROBNP  Specialty Problems      Pulmonary Problems   DOE (dyspnea on exertion)   Cough   Allergic rhinitis   Bronchiectasis without complication (HCC)      Allergies  Allergen Reactions  . Levaquin [Levofloxacin] Other (See Comments)    Leg pain .Marland Kitchen Per doctor not to take again  . Sulfonamide Derivatives     Pt unsure  .  Morphine Nausea Only  . Oxycodone-Acetaminophen Other (See Comments)    "talking out of my head"  . Percocet [Oxycodone-Acetaminophen] Nausea And Vomiting    Immunization History  Administered Date(s) Administered  . Influenza Inj Mdck Quad Pf 11/05/2018  . Influenza, High Dose Seasonal PF 11/02/2016, 11/04/2017  . Influenza,inj,quad, With Preservative 11/26/2018  . Influenza-Unspecified 11/05/2013, 11/06/2014  . Pneumococcal Conjugate-13 02/25/2014  . Pneumococcal Polysaccharide-23 05/03/2001  . Tdap 01/14/2012  . Zoster 11/08/2013    Past Medical History:  Diagnosis Date  . Allergy    SEASONAL  . Arthritis   . Asthma    "slight"   . Baker's cyst of knee, right   . CAD (coronary artery disease)    a. Myoview 10/15 - normal EF 70% // Myoview 11/16: EF 75%, normal perfusion, (there was no blood pressure drop at low level exercise with Lexiscan infusion), low risk study // c. LHC 3/17 - LAD irregs, oLCx 70 (neg FFR), mRCA 30, EF 55-65% >> med Rx // Myoview 07/2018:  EF 73, extracardiac uptake, no significant ischemia (reviewed with Dr. Burt Knack), Low Risk   . Carotid stenosis    a. Carotid US 9/15 - Bilateral 1-39% ICA >> FU 2 years // b. Bilateral ICA 1-39 >> FU prn // Carotid US 06/2018: bilat 1-39; fu prn  . Dyspnea    due to Sarcoidosis  . Elbow fracture 04/2017   Right, had surgery  . Esophageal reflux   . Hematochezia   . History of echocardiogram    a. Echo 10/15 - EF 60-65%, no RWMA  . HLD (hyperlipidemia)   . Hx of colonoscopy   . Osteoporosis   . Other chronic pulmonary heart diseases   . Paroxysmal supraventricular tachycardia ([place])   . Pneumonia   . Sarcoidosis     Tobacco History: Social History   Tobacco Use  Smoking Status Never Smoker  Smokeless Tobacco Never Used   Counseling given: Not Answered   Continue to not smoke  Outpatient Encounter Medications as of 06/11/2019  Medication Sig  . acetaminophen (TYLENOL) 500 MG tablet Take 1,000 mg by  mouth every 6 (six) hours as needed for mild pain.   Marland Kitchen albuterol (VENTOLIN HFA) 108 (90 Base) MCG/ACT inhaler Inhale 2 puffs into the lungs every 4 (four) hours as needed for wheezing or shortness of breath.  Marland Kitchen aspirin EC 81 MG tablet Take 1 tablet (81 mg total) by mouth daily.  Marland Kitchen buPROPion (WELLBUTRIN) 75 MG tablet Take 75 mg by mouth every morning.  . calcium carbonate (TUMS - DOSED IN MG ELEMENTAL CALCIUM) 500 MG chewable tablet Chew 1 tablet by mouth daily.   Marland Kitchen denosumab (PROLIA) 60 MG/ML SOLN injection Inject 60 mg into the skin every 6 (six) months. Administer in upper arm, thigh, or abdomen  . diltiazem (CARDIZEM CD) 180 MG 24 hr capsule Take 180 mg by  mouth at bedtime.   . fluticasone (FLONASE) 50 MCG/ACT nasal spray Place 2 sprays into both nostrils 2 (two) times daily.  Marland Kitchen HYDROcodone-acetaminophen (NORCO/VICODIN) 5-325 MG tablet Take 1 tablet by mouth every 6 (six) hours as needed for severe pain.   Marland Kitchen ipratropium (ATROVENT) 0.03 % nasal spray Place 2 sprays into both nostrils 4 (four) times daily as needed for rhinitis.  Marland Kitchen nitroGLYCERIN (NITROSTAT) 0.4 MG SL tablet Place 1 tablet (0.4 mg total) under the tongue every 5 (five) minutes as needed for chest pain.  Marland Kitchen omeprazole (PRILOSEC) 40 MG capsule Take 40 mg by mouth 2 (two) times daily.   . ondansetron (ZOFRAN-ODT) 4 MG disintegrating tablet Take 4 mg by mouth every 4 (four) hours as needed for nausea.   . psyllium (METAMUCIL) 58.6 % powder Take 1 packet by mouth daily.  Marland Kitchen Respiratory Therapy Supplies (FLUTTER) DEVI Use as directed  . rosuvastatin (CRESTOR) 10 MG tablet Take 10 mg by mouth 3 (three) times a week.  . senna-docusate (SENOKOT-S) 8.6-50 MG tablet Take 1 tablet by mouth at bedtime.  . solifenacin (VESICARE) 5 MG tablet Take 5 mg by mouth daily.  . Vitamin D, Ergocalciferol, (DRISDOL) 50000 units CAPS capsule Take 50,000 Units by mouth every Friday.   . [DISCONTINUED] amoxicillin-clavulanate (AUGMENTIN) 875-125 MG tablet Take  1 tablet by mouth 2 (two) times daily. (Patient taking differently: Take 1 tablet by mouth 2 (two) times daily. )   No facility-administered encounter medications on file as of 06/11/2019.     Review of Systems  Review of Systems  Constitutional: Negative for activity change, fatigue and fever.  HENT: Negative for sinus pressure, sinus pain and sore throat.   Respiratory: Positive for cough. Negative for shortness of breath and wheezing.   Cardiovascular: Negative for chest pain and palpitations.  Gastrointestinal: Negative for diarrhea, nausea and vomiting.  Musculoskeletal: Negative for arthralgias.  Neurological: Negative for dizziness.  Psychiatric/Behavioral: Negative for sleep disturbance. The patient is not nervous/anxious.      Physical Exam  BP 114/72   Pulse 68   Temp (!) 97.5 F (36.4 C) (Temporal)   Ht 5\' 3"  (1.6 m)   Wt 124 lb (56.2 kg)   LMP 02/22/1983 (Approximate)   SpO2 97% Comment: on RA  BMI 21.97 kg/m   Wt Readings from Last 5 Encounters:  06/11/19 124 lb (56.2 kg)  06/05/19 116 lb 4 oz (52.7 kg)  06/05/19 119 lb (54 kg)  05/31/19 119 lb 12.8 oz (54.3 kg)  03/25/19 120 lb (54.4 kg)    BMI Readings from Last 5 Encounters:  06/11/19 21.97 kg/m  06/05/19 20.59 kg/m  06/05/19 21.08 kg/m  05/31/19 21.91 kg/m  03/25/19 21.43 kg/m     Physical Exam Vitals and nursing note reviewed.  Constitutional:      General: She is not in acute distress.    Appearance: Normal appearance.  HENT:     Head: Normocephalic and atraumatic.     Right Ear: External ear normal.     Left Ear: External ear normal.     Nose: Nose normal. No congestion.     Mouth/Throat:     Mouth: Mucous membranes are moist.     Pharynx: Oropharynx is clear.  Eyes:     Pupils: Pupils are equal, round, and reactive to light.  Cardiovascular:     Rate and Rhythm: Normal rate and regular rhythm.     Pulses: Normal pulses.     Heart sounds: Normal heart sounds. No  murmur.    Pulmonary:     Effort: Pulmonary effort is normal. No respiratory distress.     Breath sounds: Normal breath sounds. No decreased air movement. No decreased breath sounds, wheezing or rales.  Musculoskeletal:     Cervical back: Normal range of motion.  Skin:    General: Skin is warm and dry.     Capillary Refill: Capillary refill takes less than 2 seconds.  Neurological:     General: No focal deficit present.     Mental Status: She is alert and oriented to person, place, and time. Mental status is at baseline.     Gait: Gait normal.  Psychiatric:        Mood and Affect: Mood normal.        Behavior: Behavior normal.        Thought Content: Thought content normal.        Judgment: Judgment normal.       Assessment & Plan:   Allergic rhinitis Plan: Continue nasal saline rinses Continue Flonase Continue Atrovent  Bronchiectasis without complication (HCC) Plan: Continue flutter valve Reviewed with patient how to use flutter valve  SARCOIDOSIS, PULMONARY On last imaging in December/2020  Plan: Continue to clinically monitor  Pre-op evaluation  Peri-operative Assessment of Pulmonary Risk for Non-Thoracic Surgery:  ForMs. Mckinnon, moderate risk of perioperative pulmonary complications is increased by:  Age greater than 19 years  Bronchiectasis   Mild OSA  History of sarcoid    Respiratory complications generally occur in 1% of ASA Class I patients, 5% of ASA Class II and 10% of ASA Class III-IV patients These complications rarely result in mortality and iclude postoperative pneumonia, atelectasis, pulmonary embolism, ARDS and increased time requiring postoperative mechanical ventilation.  Overall, I recommend proceeding with the surgery if the risk for respiratory complications are outweighed by the potential benefits. This will need to be discussed between the patient and surgeon.  To reduce risks of respiratory complications, I recommend: --Pre- and post-operative  incentive spirometry performed frequently while awake, encourage flutter valve  --Avoiding use of pancuronium during anesthesia.  I have discussed the risk factors and recommendations above with the patient.      Return in about 3 months (around 09/10/2019), or if symptoms worsen or fail to improve, for Follow up with Dr. [name].   Lauraine Rinne, NP 06/11/2019   This appointment required 32 minutes of patient care (this includes precharting, chart review, review of results, face-to-face care, etc.).

## 2019-06-11 ENCOUNTER — Encounter: Payer: Self-pay | Admitting: Pulmonary Disease

## 2019-06-11 ENCOUNTER — Ambulatory Visit (INDEPENDENT_AMBULATORY_CARE_PROVIDER_SITE_OTHER): Payer: Medicare Other | Admitting: Pulmonary Disease

## 2019-06-11 ENCOUNTER — Other Ambulatory Visit: Payer: Self-pay

## 2019-06-11 VITALS — BP 114/72 | HR 68 | Temp 97.5°F | Ht 63.0 in | Wt 124.0 lb

## 2019-06-11 DIAGNOSIS — J479 Bronchiectasis, uncomplicated: Secondary | ICD-10-CM

## 2019-06-11 DIAGNOSIS — Z01818 Encounter for other preprocedural examination: Secondary | ICD-10-CM

## 2019-06-11 DIAGNOSIS — D869 Sarcoidosis, unspecified: Secondary | ICD-10-CM | POA: Diagnosis not present

## 2019-06-11 DIAGNOSIS — J301 Allergic rhinitis due to pollen: Secondary | ICD-10-CM

## 2019-06-11 HISTORY — DX: Encounter for other preprocedural examination: Z01.818

## 2019-06-11 NOTE — Assessment & Plan Note (Signed)
Plan: Continue nasal saline rinses Continue Flonase Continue Atrovent

## 2019-06-11 NOTE — Assessment & Plan Note (Signed)
On last imaging in December/2020  Plan: Continue to clinically monitor

## 2019-06-11 NOTE — Assessment & Plan Note (Signed)
Plan: Continue flutter valve Reviewed with patient how to use flutter valve

## 2019-06-11 NOTE — Patient Instructions (Addendum)
You were seen today by Lauraine Rinne, NP  for:   1. Bronchiectasis without complication (HCC)  Bronchiectasis: This is the medical term which indicates that you have damage, dilated airways making you more susceptible to respiratory infection. Use a flutter valve 10 breaths twice a day or 4 to 5 breaths 4-5 times a day to help clear mucus out Let us know if you have cough with change in mucus color or fevers or chills.  At that point you would need an antibiotic. Maintain a healthy nutritious diet, eating whole foods Take your medications as prescribed   2. Allergic rhinitis due to pollen, unspecified seasonality  Continue ipratropium nasal spray Continue fluticasone nasal spray   3. Pre-op evaluation  Peri-operative Assessment of Pulmonary Risk for Non-Thoracic Surgery:  ForMs. Wittmeyer, moderate risk of perioperative pulmonary complications is increased by:  Age greater than 56 years  Bronchiectasis   Mild OSA  History of sarcoid    Respiratory complications generally occur in 1% of ASA Class I patients, 5% of ASA Class II and 10% of ASA Class III-IV patients These complications rarely result in mortality and iclude postoperative pneumonia, atelectasis, pulmonary embolism, ARDS and increased time requiring postoperative mechanical ventilation.  Overall, I recommend proceeding with the surgery if the risk for respiratory complications are outweighed by the potential benefits. This will need to be discussed between the patient and surgeon.  To reduce risks of respiratory complications, I recommend: --Pre- and post-operative incentive spirometry performed frequently while awake, encourage flutter valve  --Avoiding use of pancuronium during anesthesia.  I have discussed the risk factors and recommendations above with the patient.  Follow Up:    Return in about 3 months (around 09/10/2019), or if symptoms worsen or fail to improve, for Follow up with Dr. [name].   Please do your  part to reduce the spread of COVID-19:      Reduce your risk of any infection  and COVID19 by using the similar precautions used for avoiding the common cold or flu:  Marland Kitchen Wash your hands often with soap and warm water for at least 20 seconds.  If soap and water are not readily available, use an alcohol-based hand sanitizer with at least 60% alcohol.  . If coughing or sneezing, cover your mouth and nose by coughing or sneezing into the elbow areas of your shirt or coat, into a tissue or into your sleeve (not your hands). Langley Gauss A MASK when in public  . Avoid shaking hands with others and consider head nods or verbal greetings only. . Avoid touching your eyes, nose, or mouth with unwashed hands.  . Avoid close contact with people who are sick. . Avoid places or events with large numbers of people in one location, like concerts or sporting events. . If you have some symptoms but not all symptoms, continue to monitor at home and seek medical attention if your symptoms worsen. . If you are having a medical emergency, call 911.   [place] / e-Visit: [email or web address]         [facility]: [facility]: [id number]                   [facility]: [id number]     It is flu season:   >>> Best ways to protect herself from the flu: Receive the yearly flu vaccine, practice good hand hygiene washing with soap and also  using hand sanitizer when available, eat a nutritious meals, get adequate rest, hydrate appropriately   Please contact the office if your symptoms worsen or you have concerns that you are not improving.   Thank you for choosing [facility] for your healthcare, and for allowing [place] to partner with you on your healthcare journey. I am thankful to be able to provide care to you today.   Wyn Quaker FNP-C

## 2019-06-11 NOTE — Assessment & Plan Note (Signed)
  Peri-operative Assessment of Pulmonary Risk for Non-Thoracic Surgery:  ForMs. Brownley, moderate risk of perioperative pulmonary complications is increased by:  Age greater than 1 years  Bronchiectasis   Mild OSA  History of sarcoid    Respiratory complications generally occur in 1% of ASA Class I patients, 5% of ASA Class II and 10% of ASA Class III-IV patients These complications rarely result in mortality and iclude postoperative pneumonia, atelectasis, pulmonary embolism, ARDS and increased time requiring postoperative mechanical ventilation.  Overall, I recommend proceeding with the surgery if the risk for respiratory complications are outweighed by the potential benefits. This will need to be discussed between the patient and surgeon.  To reduce risks of respiratory complications, I recommend: --Pre- and post-operative incentive spirometry performed frequently while awake, encourage flutter valve  --Avoiding use of pancuronium during anesthesia.  I have discussed the risk factors and recommendations above with the patient.

## 2019-06-13 ENCOUNTER — Other Ambulatory Visit: Payer: Self-pay

## 2019-06-13 ENCOUNTER — Encounter (HOSPITAL_COMMUNITY): Payer: Self-pay | Admitting: Orthopedic Surgery

## 2019-06-13 ENCOUNTER — Ambulatory Visit (HOSPITAL_COMMUNITY): Payer: Medicare Other | Admitting: Anesthesiology

## 2019-06-13 ENCOUNTER — Ambulatory Visit (HOSPITAL_COMMUNITY)
Admission: RE | Admit: 2019-06-13 | Discharge: 2019-06-14 | Disposition: A | Discharge status: Home / Self Care | Payer: Medicare Other | Source: Ambulatory Visit | Attending: Orthopedic Surgery | Admitting: Orthopedic Surgery

## 2019-06-13 ENCOUNTER — Encounter (HOSPITAL_COMMUNITY)
Admission: RE | Disposition: A | Discharge status: Home / Self Care | Payer: Self-pay | Source: Ambulatory Visit | Attending: Orthopedic Surgery

## 2019-06-13 DIAGNOSIS — G709 Myoneural disorder, unspecified: Secondary | ICD-10-CM | POA: Diagnosis not present

## 2019-06-13 DIAGNOSIS — I471 Supraventricular tachycardia: Secondary | ICD-10-CM | POA: Insufficient documentation

## 2019-06-13 DIAGNOSIS — R0600 Dyspnea, unspecified: Secondary | ICD-10-CM | POA: Insufficient documentation

## 2019-06-13 DIAGNOSIS — D869 Sarcoidosis, unspecified: Secondary | ICD-10-CM | POA: Diagnosis not present

## 2019-06-13 DIAGNOSIS — Z96612 Presence of left artificial shoulder joint: Secondary | ICD-10-CM

## 2019-06-13 DIAGNOSIS — Z809 Family history of malignant neoplasm, unspecified: Secondary | ICD-10-CM | POA: Diagnosis not present

## 2019-06-13 DIAGNOSIS — K219 Gastro-esophageal reflux disease without esophagitis: Secondary | ICD-10-CM | POA: Insufficient documentation

## 2019-06-13 DIAGNOSIS — I10 Essential (primary) hypertension: Secondary | ICD-10-CM | POA: Diagnosis not present

## 2019-06-13 DIAGNOSIS — J45909 Unspecified asthma, uncomplicated: Secondary | ICD-10-CM | POA: Insufficient documentation

## 2019-06-13 DIAGNOSIS — M75102 Unspecified rotator cuff tear or rupture of left shoulder, not specified as traumatic: Secondary | ICD-10-CM | POA: Insufficient documentation

## 2019-06-13 DIAGNOSIS — Z801 Family history of malignant neoplasm of trachea, bronchus and lung: Secondary | ICD-10-CM | POA: Diagnosis not present

## 2019-06-13 DIAGNOSIS — Z79899 Other long term (current) drug therapy: Secondary | ICD-10-CM | POA: Diagnosis not present

## 2019-06-13 DIAGNOSIS — I6523 Occlusion and stenosis of bilateral carotid arteries: Secondary | ICD-10-CM | POA: Insufficient documentation

## 2019-06-13 DIAGNOSIS — M1990 Unspecified osteoarthritis, unspecified site: Secondary | ICD-10-CM | POA: Diagnosis not present

## 2019-06-13 DIAGNOSIS — M810 Age-related osteoporosis without current pathological fracture: Secondary | ICD-10-CM | POA: Insufficient documentation

## 2019-06-13 DIAGNOSIS — Z8249 Family history of ischemic heart disease and other diseases of the circulatory system: Secondary | ICD-10-CM | POA: Insufficient documentation

## 2019-06-13 DIAGNOSIS — Z8041 Family history of malignant neoplasm of ovary: Secondary | ICD-10-CM | POA: Insufficient documentation

## 2019-06-13 DIAGNOSIS — E785 Hyperlipidemia, unspecified: Secondary | ICD-10-CM | POA: Insufficient documentation

## 2019-06-13 DIAGNOSIS — I2510 Atherosclerotic heart disease of native coronary artery without angina pectoris: Secondary | ICD-10-CM | POA: Diagnosis not present

## 2019-06-13 HISTORY — PX: REVERSE SHOULDER ARTHROPLASTY: SHX5054

## 2019-06-13 SURGERY — ARTHROPLASTY, SHOULDER, TOTAL, REVERSE
Anesthesia: General | Site: Shoulder | Laterality: Left

## 2019-06-13 MED ORDER — TRAMADOL HCL 50 MG PO TABS: 50.0000 mg | ORAL_TABLET | Freq: Four times a day (QID) | ORAL | Status: DC | PRN

## 2019-06-13 MED ORDER — PROPOFOL 10 MG/ML IV BOLUS
INTRAVENOUS | Status: AC
  Filled 2019-06-13: qty 20

## 2019-06-13 MED ORDER — TRANEXAMIC ACID-NACL 1000-0.7 MG/100ML-% IV SOLN
1000.0000 mg | INTRAVENOUS | Status: AC
  Administered 2019-06-13: 1000 mg via INTRAVENOUS

## 2019-06-13 MED ORDER — HYDROMORPHONE HCL 1 MG/ML IJ SOLN: 0.5000 mg | INTRAMUSCULAR | Status: DC | PRN

## 2019-06-13 MED ORDER — FENTANYL CITRATE (PF) 100 MCG/2ML IJ SOLN: 25.0000 ug | INTRAMUSCULAR | Status: DC | PRN

## 2019-06-13 MED ORDER — MIDAZOLAM HCL 2 MG/2ML IJ SOLN: 1.0000 mg | INTRAMUSCULAR | Status: DC

## 2019-06-13 MED ORDER — ONDANSETRON HCL 4 MG PO TABS
4.0000 mg | ORAL_TABLET | Freq: Four times a day (QID) | ORAL | Status: DC | PRN
  Administered 2019-06-14: 15:00:00 4 mg via ORAL
  Filled 2019-06-13: qty 1

## 2019-06-13 MED ORDER — MAGNESIUM CITRATE PO SOLN: 1.0000 | Freq: Once | ORAL | Status: DC | PRN

## 2019-06-13 MED ORDER — TEMAZEPAM 15 MG PO CAPS: 15.0000 mg | ORAL_CAPSULE | Freq: Every evening | ORAL | Status: DC | PRN

## 2019-06-13 MED ORDER — ONDANSETRON HCL 4 MG/2ML IJ SOLN
4.0000 mg | Freq: Four times a day (QID) | INTRAMUSCULAR | Status: DC | PRN
  Administered 2019-06-13 – 2019-06-14 (×2): 4 mg via INTRAVENOUS
  Filled 2019-06-13 (×2): qty 2

## 2019-06-13 MED ORDER — OXYCODONE-ACETAMINOPHEN 5-325 MG PO TABS
1.0000 | ORAL_TABLET | ORAL | Status: DC | PRN
  Administered 2019-06-13 – 2019-06-14 (×4): 1 via ORAL
  Filled 2019-06-13 (×4): qty 1

## 2019-06-13 MED ORDER — LACTATED RINGERS IV SOLN: INTRAVENOUS | Status: DC

## 2019-06-13 MED ORDER — FENTANYL CITRATE (PF) 100 MCG/2ML IJ SOLN: 50.0000 ug | INTRAMUSCULAR | Status: DC

## 2019-06-13 MED ORDER — PHENYLEPHRINE HCL (PRESSORS) 10 MG/ML IV SOLN
INTRAVENOUS | Status: AC
  Filled 2019-06-13: qty 1

## 2019-06-13 MED ORDER — DILTIAZEM HCL ER COATED BEADS 180 MG PO CP24
180.0000 mg | ORAL_CAPSULE | Freq: Every day | ORAL | Status: DC
  Administered 2019-06-13: 180 mg via ORAL
  Filled 2019-06-13: qty 1

## 2019-06-13 MED ORDER — BUPROPION HCL 75 MG PO TABS
75.0000 mg | ORAL_TABLET | Freq: Every morning | ORAL | Status: DC
  Administered 2019-06-14: 10:00:00 75 mg via ORAL
  Filled 2019-06-13: qty 1

## 2019-06-13 MED ORDER — HYDROCODONE-ACETAMINOPHEN 7.5-325 MG PO TABS: 2.0000 | ORAL_TABLET | ORAL | Status: DC | PRN

## 2019-06-13 MED ORDER — PANTOPRAZOLE SODIUM 40 MG PO TBEC
40.0000 mg | DELAYED_RELEASE_TABLET | Freq: Every day | ORAL | Status: DC
  Administered 2019-06-14: 10:00:00 40 mg via ORAL
  Filled 2019-06-13: qty 1

## 2019-06-13 MED ORDER — METHOCARBAMOL 1000 MG/10ML IJ SOLN
500.0000 mg | Freq: Four times a day (QID) | INTRAVENOUS | Status: DC | PRN
  Filled 2019-06-13: qty 5

## 2019-06-13 MED ORDER — METOCLOPRAMIDE HCL 5 MG PO TABS: 5.0000 mg | ORAL_TABLET | Freq: Three times a day (TID) | ORAL | Status: DC | PRN

## 2019-06-13 MED ORDER — 0.9 % SODIUM CHLORIDE (POUR BTL) OPTIME
TOPICAL | Status: DC | PRN
  Administered 2019-06-13: 1000 mL

## 2019-06-13 MED ORDER — ROCURONIUM BROMIDE 10 MG/ML (PF) SYRINGE
PREFILLED_SYRINGE | INTRAVENOUS | Status: DC | PRN
  Administered 2019-06-13: 30 mg via INTRAVENOUS

## 2019-06-13 MED ORDER — EPHEDRINE SULFATE-NACL 50-0.9 MG/10ML-% IV SOSY
PREFILLED_SYRINGE | INTRAVENOUS | Status: DC | PRN
  Administered 2019-06-13: 10 mg via INTRAVENOUS

## 2019-06-13 MED ORDER — ALUM & MAG HYDROXIDE-SIMETH 200-200-20 MG/5ML PO SUSP: 30.0000 mL | ORAL | Status: DC | PRN

## 2019-06-13 MED ORDER — STERILE WATER FOR IRRIGATION IR SOLN
Status: DC | PRN
  Administered 2019-06-13: 2000 mL

## 2019-06-13 MED ORDER — DEXAMETHASONE SODIUM PHOSPHATE 10 MG/ML IJ SOLN
INTRAMUSCULAR | Status: AC
  Filled 2019-06-13: qty 1

## 2019-06-13 MED ORDER — BUPIVACAINE-EPINEPHRINE (PF) 0.5% -1:200000 IJ SOLN
INTRAMUSCULAR | Status: DC | PRN
  Administered 2019-06-13: 15 mL via PERINEURAL

## 2019-06-13 MED ORDER — DEXAMETHASONE SODIUM PHOSPHATE 10 MG/ML IJ SOLN
INTRAMUSCULAR | Status: DC | PRN
  Administered 2019-06-13: 4 mg via INTRAVENOUS

## 2019-06-13 MED ORDER — BISACODYL 5 MG PO TBEC: 5.0000 mg | DELAYED_RELEASE_TABLET | Freq: Every day | ORAL | Status: DC | PRN

## 2019-06-13 MED ORDER — BUPIVACAINE LIPOSOME 1.3 % IJ SUSP
INTRAMUSCULAR | Status: DC | PRN
  Administered 2019-06-13: 15 mL via PERINEURAL

## 2019-06-13 MED ORDER — PHENYLEPHRINE HCL-NACL 10-0.9 MG/250ML-% IV SOLN
INTRAVENOUS | Status: DC | PRN
  Administered 2019-06-13: 30 ug/min via INTRAVENOUS

## 2019-06-13 MED ORDER — ASPIRIN EC 81 MG PO TBEC
81.0000 mg | DELAYED_RELEASE_TABLET | Freq: Every day | ORAL | Status: DC
  Administered 2019-06-14: 81 mg via ORAL
  Filled 2019-06-13: qty 1

## 2019-06-13 MED ORDER — METOCLOPRAMIDE HCL 5 MG/ML IJ SOLN: 5.0000 mg | Freq: Three times a day (TID) | INTRAMUSCULAR | Status: DC | PRN

## 2019-06-13 MED ORDER — PHENOL 1.4 % MT LIQD: 1.0000 | OROMUCOSAL | Status: DC | PRN

## 2019-06-13 MED ORDER — DIPHENHYDRAMINE HCL 12.5 MG/5ML PO ELIX
12.5000 mg | ORAL_SOLUTION | ORAL | Status: DC | PRN
  Administered 2019-06-14: 03:00:00 25 mg via ORAL
  Filled 2019-06-13: qty 10

## 2019-06-13 MED ORDER — ALBUTEROL SULFATE (2.5 MG/3ML) 0.083% IN NEBU: 3.0000 mL | INHALATION_SOLUTION | RESPIRATORY_TRACT | Status: DC | PRN

## 2019-06-13 MED ORDER — POLYETHYLENE GLYCOL 3350 17 G PO PACK: 17.0000 g | PACK | Freq: Every day | ORAL | Status: DC | PRN

## 2019-06-13 MED ORDER — NITROGLYCERIN 0.4 MG SL SUBL: 0.4000 mg | SUBLINGUAL_TABLET | SUBLINGUAL | Status: DC | PRN

## 2019-06-13 MED ORDER — ONDANSETRON HCL 4 MG/2ML IJ SOLN
INTRAMUSCULAR | Status: AC
  Filled 2019-06-13: qty 2

## 2019-06-13 MED ORDER — PANTOPRAZOLE SODIUM 40 MG PO TBEC: 40.0000 mg | DELAYED_RELEASE_TABLET | Freq: Every day | ORAL | Status: DC

## 2019-06-13 MED ORDER — CEFAZOLIN SODIUM-DEXTROSE 2-4 GM/100ML-% IV SOLN
INTRAVENOUS | Status: AC
  Filled 2019-06-13: qty 100

## 2019-06-13 MED ORDER — ACETAMINOPHEN 325 MG PO TABS: 325.0000 mg | ORAL_TABLET | Freq: Four times a day (QID) | ORAL | Status: DC | PRN

## 2019-06-13 MED ORDER — CHLORHEXIDINE GLUCONATE 4 % EX LIQD: 60.0000 mL | Freq: Once | CUTANEOUS | Status: DC

## 2019-06-13 MED ORDER — DILTIAZEM HCL ER COATED BEADS 180 MG PO CP24
180.0000 mg | ORAL_CAPSULE | Freq: Every day | ORAL | Status: DC
  Filled 2019-06-13: qty 1

## 2019-06-13 MED ORDER — TRANEXAMIC ACID-NACL 1000-0.7 MG/100ML-% IV SOLN
INTRAVENOUS | Status: AC
  Filled 2019-06-13: qty 100

## 2019-06-13 MED ORDER — ROCURONIUM BROMIDE 10 MG/ML (PF) SYRINGE
PREFILLED_SYRINGE | INTRAVENOUS | Status: AC
  Filled 2019-06-13: qty 10

## 2019-06-13 MED ORDER — SUGAMMADEX SODIUM 200 MG/2ML IV SOLN
INTRAVENOUS | Status: DC | PRN
  Administered 2019-06-13: 200 mg via INTRAVENOUS

## 2019-06-13 MED ORDER — PROPOFOL 10 MG/ML IV BOLUS
INTRAVENOUS | Status: DC | PRN
  Administered 2019-06-13: 30 mg via INTRAVENOUS
  Administered 2019-06-13: 100 mg via INTRAVENOUS

## 2019-06-13 MED ORDER — ONDANSETRON HCL 4 MG/2ML IJ SOLN: 4.0000 mg | Freq: Once | INTRAMUSCULAR | Status: DC | PRN

## 2019-06-13 MED ORDER — DOCUSATE SODIUM 100 MG PO CAPS
100.0000 mg | ORAL_CAPSULE | Freq: Two times a day (BID) | ORAL | Status: DC
  Administered 2019-06-13 – 2019-06-14 (×2): 100 mg via ORAL
  Filled 2019-06-13 (×2): qty 1

## 2019-06-13 MED ORDER — HYDROCODONE-ACETAMINOPHEN 5-325 MG PO TABS: 1.0000 | ORAL_TABLET | ORAL | Status: DC | PRN

## 2019-06-13 MED ORDER — MENTHOL 3 MG MT LOZG: 1.0000 | LOZENGE | OROMUCOSAL | Status: DC | PRN

## 2019-06-13 MED ORDER — FENTANYL CITRATE (PF) 100 MCG/2ML IJ SOLN
INTRAMUSCULAR | Status: AC
  Administered 2019-06-13: 50 ug via INTRAVENOUS
  Filled 2019-06-13: qty 2

## 2019-06-13 MED ORDER — MIDAZOLAM HCL 2 MG/2ML IJ SOLN
INTRAMUSCULAR | Status: AC
  Filled 2019-06-13: qty 2

## 2019-06-13 MED ORDER — LIDOCAINE 2% (20 MG/ML) 5 ML SYRINGE
INTRAMUSCULAR | Status: DC | PRN
  Administered 2019-06-13: 20 mg via INTRAVENOUS

## 2019-06-13 MED ORDER — METHOCARBAMOL 500 MG PO TABS
500.0000 mg | ORAL_TABLET | Freq: Four times a day (QID) | ORAL | Status: DC | PRN
  Administered 2019-06-13 – 2019-06-14 (×2): 500 mg via ORAL
  Filled 2019-06-13 (×2): qty 1

## 2019-06-13 MED ORDER — ONDANSETRON HCL 4 MG/2ML IJ SOLN
INTRAMUSCULAR | Status: DC | PRN
  Administered 2019-06-13: 4 mg via INTRAVENOUS

## 2019-06-13 MED ORDER — LIDOCAINE 2% (20 MG/ML) 5 ML SYRINGE
INTRAMUSCULAR | Status: AC
  Filled 2019-06-13: qty 5

## 2019-06-13 MED ORDER — CEFAZOLIN SODIUM-DEXTROSE 2-4 GM/100ML-% IV SOLN
2.0000 g | INTRAVENOUS | Status: AC
  Administered 2019-06-13: 2 g via INTRAVENOUS

## 2019-06-13 SURGICAL SUPPLY — 75 items
ADH SKN CLS APL DERMABOND .7 (GAUZE/BANDAGES/DRESSINGS) ×1
AID PSTN UNV HD RSTRNT DISP (MISCELLANEOUS) ×1
BAG SPEC THK2 15X12 ZIP CLS (MISCELLANEOUS) ×1
BAG ZIPLOCK 12X15 (MISCELLANEOUS) ×2 IMPLANT
BLADE SAW SGTL 83.5X18.5 (BLADE) ×2 IMPLANT
BSPLAT GLND +2X24 MDLR (Joint) ×1 IMPLANT
COOLER ICEMAN CLASSIC (MISCELLANEOUS) ×1 IMPLANT
COVER BACK TABLE 60X90IN (DRAPES) ×2 IMPLANT
COVER SURGICAL LIGHT HANDLE (MISCELLANEOUS) ×2 IMPLANT
COVER WAND RF STERILE (DRAPES) IMPLANT
CUP SUT UNIV REVERS 36 NEUTRAL (Cup) ×1 IMPLANT
DERMABOND ADVANCED (GAUZE/BANDAGES/DRESSINGS) ×1
DERMABOND ADVANCED .7 DNX12 (GAUZE/BANDAGES/DRESSINGS) ×1 IMPLANT
DRAPE INCISE IOBAN 66X45 STRL (DRAPES) IMPLANT
DRAPE ORTHO SPLIT 77X108 STRL (DRAPES) ×4
DRAPE SHEET LG 3/4 BI-LAMINATE (DRAPES) ×2 IMPLANT
DRAPE SURG 17X11 SM STRL (DRAPES) ×2 IMPLANT
DRAPE SURG ORHT 6 SPLT 77X108 (DRAPES) ×2 IMPLANT
DRAPE U-SHAPE 47X51 STRL (DRAPES) ×2 IMPLANT
DRSG AQUACEL AG ADV 3.5X 6 (GAUZE/BANDAGES/DRESSINGS) ×1 IMPLANT
DRSG AQUACEL AG ADV 3.5X10 (GAUZE/BANDAGES/DRESSINGS) ×2 IMPLANT
DURAPREP 26ML APPLICATOR (WOUND CARE) ×2 IMPLANT
ELECT BLADE TIP CTD 4 INCH (ELECTRODE) ×2 IMPLANT
ELECT REM PT RETURN 15FT ADLT (MISCELLANEOUS) ×2 IMPLANT
FACESHIELD WRAPAROUND (MASK) ×8 IMPLANT
FACESHIELD WRAPAROUND OR TEAM (MASK) ×4 IMPLANT
GLENOID UNI REV MOD 24 +2 LAT (Joint) ×1 IMPLANT
GLENOSPHERE 36 +4 LAT/24 (Joint) ×1 IMPLANT
GLOVE BIO SURGEON STRL SZ7.5 (GLOVE) ×2 IMPLANT
GLOVE BIO SURGEON STRL SZ8 (GLOVE) ×2 IMPLANT
GLOVE SS BIOGEL STRL SZ 7 (GLOVE) ×1 IMPLANT
GLOVE SS BIOGEL STRL SZ 7.5 (GLOVE) ×1 IMPLANT
GLOVE SUPERSENSE BIOGEL SZ 7 (GLOVE) ×1
GLOVE SUPERSENSE BIOGEL SZ 7.5 (GLOVE) ×1
GLOVE SURG SYN 7.0 (GLOVE) IMPLANT
GLOVE SURG SYN 7.0 PF PI (GLOVE) IMPLANT
GLOVE SURG SYN 7.5  E (GLOVE)
GLOVE SURG SYN 7.5 E (GLOVE) IMPLANT
GLOVE SURG SYN 7.5 PF PI (GLOVE) IMPLANT
GLOVE SURG SYN 8.0 (GLOVE) IMPLANT
GLOVE SURG SYN 8.0 PF PI (GLOVE) IMPLANT
GOWN STRL REUS W/TWL LRG LVL3 (GOWN DISPOSABLE) ×4 IMPLANT
KIT BASIN (CUSTOM PROCEDURE TRAY) ×2 IMPLANT
KIT TURNOVER KIT A (KITS) IMPLANT
LINER HUMERAL 36 +3MM SM (Shoulder) ×1 IMPLANT
MANIFOLD NEPTUNE II (INSTRUMENTS) ×2 IMPLANT
NDL TAPERED W/ NITINOL LOOP (MISCELLANEOUS) ×1 IMPLANT
NEEDLE TAPERED W/ NITINOL LOOP (MISCELLANEOUS) ×2 IMPLANT
NS IRRIG 1000ML POUR BTL (IV SOLUTION) ×2 IMPLANT
PACK SHOULDER (CUSTOM PROCEDURE TRAY) ×2 IMPLANT
PAD ARMBOARD 7.5X6 YLW CONV (MISCELLANEOUS) ×2 IMPLANT
PAD COLD SHLDR WRAP-ON (PAD) ×1 IMPLANT
PIN SET MODULAR GLENOID SYSTEM (PIN) ×1 IMPLANT
RESTRAINT HEAD UNIVERSAL NS (MISCELLANEOUS) ×2 IMPLANT
SCREW CENTRAL MODULAR 25 (Screw) ×1 IMPLANT
SCREW PERIPHERAL 5.5X20 LOCK (Screw) ×3 IMPLANT
SCREW PERIPHERAL 5.5X28 LOCK (Screw) ×1 IMPLANT
SLING ARM FOAM STRAP LRG (SOFTGOODS) IMPLANT
SLING ARM FOAM STRAP MED (SOFTGOODS) ×1 IMPLANT
SLING ARM IMMOBILIZER MED (SOFTGOODS) ×1 IMPLANT
SPONGE LAP 18X18 RF (DISPOSABLE) IMPLANT
STEM HUMERAL UNI REVERS SZ6 (Stem) ×1 IMPLANT
SUCTION FRAZIER HANDLE 12FR (TUBING) ×2
SUCTION TUBE FRAZIER 12FR DISP (TUBING) ×1 IMPLANT
SUT FIBERWIRE #2 38 T-5 BLUE (SUTURE)
SUT MNCRL AB 3-0 PS2 18 (SUTURE) ×2 IMPLANT
SUT MON AB 2-0 CT1 36 (SUTURE) ×2 IMPLANT
SUT VIC AB 1 CT1 36 (SUTURE) ×2 IMPLANT
SUTURE FIBERWR #2 38 T-5 BLUE (SUTURE) IMPLANT
SUTURE TAPE 1.3 40 TPR END (SUTURE) ×2 IMPLANT
SUTURETAPE 1.3 40 TPR END (SUTURE) ×4
TOWEL OR 17X26 10 PK STRL BLUE (TOWEL DISPOSABLE) ×2 IMPLANT
TOWEL OR NON WOVEN STRL DISP B (DISPOSABLE) ×2 IMPLANT
WATER STERILE IRR 1000ML POUR (IV SOLUTION) ×4 IMPLANT
YANKAUER SUCT BULB TIP 10FT TU (MISCELLANEOUS) ×2 IMPLANT

## 2019-06-13 NOTE — Anesthesia Preprocedure Evaluation (Addendum)
Anesthesia Evaluation  Patient identified by MRN, date of birth, ID band Patient awake    Reviewed: Allergy & Precautions, NPO status , Patient's Chart, lab work & pertinent test results  Airway Mallampati: II  TM Distance: >3 FB Neck ROM: Full    Dental  (+) Dental Advisory Given   Pulmonary asthma ,  Bronchiectasis   breath sounds clear to auscultation       Cardiovascular hypertension, Pt. on medications + CAD and + DOE   Rhythm:Regular Rate:Normal  Coronary artery disease Cath 3/17: oLCx 60-70 (neg by FFR) Myoview 11/16: no ischemia Myoview 07/2018: EF 73, prob low risk (significant extracardiac uptake), no ischemia   Neuro/Psych  Neuromuscular disease    GI/Hepatic Neg liver ROS, GERD  ,  Endo/Other  negative endocrine ROS  Renal/GU negative Renal ROS     Musculoskeletal  (+) Arthritis ,   Abdominal   Peds  Hematology negative hematology ROS (+)   Anesthesia Other Findings   Reproductive/Obstetrics                            Lab Results  Component Value Date   WBC 10.5 06/05/2019   HGB 12.1 06/05/2019   HCT 37.7 06/05/2019   MCV 102.4 (H) 06/05/2019   PLT 257 06/05/2019   Lab Results  Component Value Date   CREATININE 0.70 06/05/2019   BUN 24 (H) 06/05/2019   NA 139 06/05/2019   K 4.4 06/05/2019   CL 105 06/05/2019   CO2 25 06/05/2019    Anesthesia Physical Anesthesia Plan  ASA: II  Anesthesia Plan: General   Post-op Pain Management:  Regional for Post-op pain   Induction: Intravenous  PONV Risk Score and Plan: 3 and Dexamethasone, Ondansetron and Treatment may vary due to age or medical condition  Airway Management Planned: Oral ETT  Additional Equipment: None  Intra-op Plan:   Post-operative Plan: Extubation in OR  Informed Consent: I have reviewed the patients History and Physical, chart, labs and discussed the procedure including the risks, benefits  and alternatives for the proposed anesthesia with the patient or authorized representative who has indicated his/her understanding and acceptance.     Dental advisory given  Plan Discussed with: CRNA  Anesthesia Plan Comments:         Anesthesia Quick Evaluation

## 2019-06-13 NOTE — Anesthesia Procedure Notes (Signed)
Procedure Name: Intubation Date/Time: 06/13/2019 9:58 AM Performed by: Myna Bright, CRNA Pre-anesthesia Checklist: Patient identified, Emergency Drugs available, Suction available and Patient being monitored Patient Re-evaluated:Patient Re-evaluated prior to induction Oxygen Delivery Method: Circle system utilized Preoxygenation: Pre-oxygenation with 100% oxygen Induction Type: IV induction Ventilation: Mask ventilation without difficulty Laryngoscope Size: Mac and 3 Grade View: Grade II Tube type: Oral Tube size: 7.0 mm Number of attempts: 1 Airway Equipment and Method: Stylet Placement Confirmation: ETT inserted through vocal cords under direct vision,  positive ETCO2 and breath sounds checked- equal and bilateral Secured at: 21 cm Tube secured with: Tape Dental Injury: Teeth and Oropharynx as per pre-operative assessment

## 2019-06-13 NOTE — Op Note (Signed)
06/13/2019  11:03 AM  PATIENT:   [name]  85 y.o. female  PRE-OPERATIVE DIAGNOSIS:  left shoulder rotator cuff tear arthropathy  POST-OPERATIVE DIAGNOSIS: Same  PROCEDURE: Left shoulder reverse arthroplasty utilizing a press-fit size 6 Arthrex stem with a +3 polyethylene insert on a neutral metaphysis, 36/+4 glenosphere on a small/+2 baseplate  SURGEON:  Ellysa Parrack, Metta Clines M.D.  ASSISTANTS: Jenetta Loges, PA-C  ANESTHESIA:   General endotracheal and interscalene block with Exparel  EBL: 150 cc  SPECIMEN: None  Drains: None   PATIENT DISPOSITION:  PACU - hemodynamically stable.    PLAN OF CARE: Admit for overnight observation  Brief history:  [name] is an 85 year old female who has had chronic and progressively increasing left shoulder pain related to advanced rotator cuff tear arthropathy.  Due to her increasing pain and functional mentation she is brought to the operating this time for planned left shoulder reverse arthroplasty.  Preoperatively I counseled [name] regarding treatment options as well as the potential risks versus benefits thereof.  Possible surgical complications were reviewed including bleeding, infection, neurovascular injury, persistent pain, loss of motion, anesthetic complication, failure of the implant, and possible need for additional surgery.  She understands, and accepts, and agrees with our planned procedure.  Procedure in detail:  After undergoing routine preop evaluation patient received prophylactic antibiotics and interscalene block with Exparel established in the holding area by the anesthesia department.  Placed supine on the operating table and underwent the smooth induction of a general endotracheal anesthesia.  Placed into the beachchair position and appropriately padded and protected.  The left shoulder girdle region was sterilely prepped and draped in standard fashion.  Timeout was called.  An anterior deltopectoral approach  left shoulder is made through an 8 cm incision.  Skin flaps elevated dissection carried deeply the deltopectoral interval was then developed from proximal to distal with the vein taken laterally.  The upper centimeter and half of the pectoralis major was tenotomized for exposure.  Conjoined tendon was mobilized and retracted medially.  There been previously a rupture the long head biceps tendon.  The subscapularis was then separated from the lesser tuberosity and tagged with a pair of FiberWire sutures.  Subscap was then mobilized and reflected medially and the capsule attachments on the anterior and inferior margins of the humeral neck were then divided allowing deliver the humeral head through the wound.  Extra medullary guide then used to outline our proposed humeral head resection which was then performed with an oscillating saw at approximately 20 degrees retroversion and a metal cap placed over the cut proximal humeral surface we then exposed the glenoid with appropriate retractors.  Circumferential labral removal was completed and a guidepin was then directed into the center of the glenoid with an approximately 5 degree inferior tilt.  The glenoid was then reamed with the central followed by the peripheral reamer and all residual bony debris was removed and a stable subchondral bone bed was achieved.  The central drill hole was then completed and tapped and our final implant with a 25 mm lag screw was assembled and then inserted with excellent fit and fixation and subsequently the peripheral locking screws were all placed obtaining good purchase and fixation.  A 36/+4 glenosphere was then impacted onto the baseplate and the central locking screw was placed.  We then returned our attention to the proximal humerus where the canal was opened and broached to size 6 and a 20 degree retroversion alignment and the metaphysis  was then reamed with a central reaming guide.  Trial implant placed and trial reduction  showed good soft tissue balance good mobility and good stability.  Our final implant was then assembled and subsequently seated with excellent fit and fixation.  Trial reduction again showed a +3 poly with the best fit.  Trial was then removed the final polywas then impacted into position and the final reduction was then performed.  This showed excellent shoulder motion good stability good soft tissue balance.  The wound was copes irrigated.  The subscapularis was mobilized and then repaired back to the eyelets on the collar of the implant utilizing the previously placed #2 FiberWire sutures.  The cephalic vein was noted to have been compromised proximally and so it was electrocoagulated about the area of compromise.  The deltopectoral interval was then reapproximated with a series of figure-of-eight and 1 Vicryl sutures.  2-0 Vicryl used for subcu layer and intracuticular 3 Monocryl for the skin followed by Dermabond and an Aquacel dressing.  Left arm was then placed in the sling and the patient was awakened, extubated, and taken to the recovery in stable condition.  Jenetta Loges, PA-C was used as an Environmental consultant throughout this case essential for help with positioning the patient, positioning extremity, tissue manipulation, implantation of the prosthesis, wound closure, and intraoperative decision-making.  Marin Shutter MD   Contact # [phone]

## 2019-06-13 NOTE — Anesthesia Postprocedure Evaluation (Signed)
Anesthesia Post Note  Patient: [name]  Procedure(s) Performed: REVERSE SHOULDER ARTHROPLASTY (Left Shoulder)     Patient location during evaluation: PACU Anesthesia Type: General Level of consciousness: awake and alert Pain management: pain level controlled Vital Signs Assessment: post-procedure vital signs reviewed and stable Respiratory status: spontaneous breathing, nonlabored ventilation, respiratory function stable and patient connected to nasal cannula oxygen Cardiovascular status: blood pressure returned to baseline and stable Postop Assessment: no apparent nausea or vomiting Anesthetic complications: no    Last Vitals:  Vitals:   06/13/19 1245 06/13/19 1448  BP: (!) 146/63 (!) 141/62  Pulse: 70 76  Resp: 18 16  Temp: (!) 36.3 C   SpO2: 99% 100%    Last Pain:  Vitals:   06/13/19 1245  TempSrc:   PainSc: 0-No pain                 Tiajuana Amass

## 2019-06-13 NOTE — Progress Notes (Signed)
Assisted Dr. Rob Fitzgerald with left, ultrasound guided, interscalene  block. Side rails up, monitors on throughout procedure. See vital signs in flow sheet. Tolerated Procedure well. 

## 2019-06-13 NOTE — Anesthesia Procedure Notes (Signed)
Anesthesia Regional Block: Interscalene brachial plexus block   Pre-Anesthetic Checklist: ,, timeout performed, Correct Patient, Correct Site, Correct Laterality, Correct Procedure, Correct Position, site marked, Risks and benefits discussed,  Surgical consent,  Pre-op evaluation,  At surgeon's request and post-op pain management  Laterality: Left  Prep: chloraprep       Needles:  Injection technique: Single-shot  Needle Type: Echogenic Stimulator Needle     Needle Length: 9cm  Needle Gauge: 21     Additional Needles:   Procedures:, nerve stimulator,,, ultrasound used (permanent image in chart),,,,   Nerve Stimulator or Paresthesia:  Response: deltoid, 0.5 mA,   Additional Responses:   Narrative:  Start time: 06/13/2019 9:02 AM End time: 06/13/2019 9:09 AM Injection made incrementally with aspirations every 5 mL.  Performed by: Personally  Anesthesiologist: Suzette Battiest, MD

## 2019-06-13 NOTE — H&P (Signed)
[name]    Chief Complaint: left shoulder rotator cuff tear arthropathy HPI: The patient is a 85 y.o. female with chronic and progressively increasing left shoulder pain related to advanced left shoulder rotator cuff tear arthropathy  Past Medical History:  Diagnosis Date  . Allergy    SEASONAL  . Arthritis   . Asthma    "slight"   . Baker's cyst of knee, right   . CAD (coronary artery disease)    a. Myoview 10/15 - normal EF 70% // Myoview 11/16: EF 75%, normal perfusion, (there was no blood pressure drop at low level exercise with Lexiscan infusion), low risk study // c. LHC 3/17 - LAD irregs, oLCx 70 (neg FFR), mRCA 30, EF 55-65% >> med Rx // Myoview 07/2018:  EF 73, extracardiac uptake, no significant ischemia (reviewed with Dr. Burt Knack), Low Risk   . Carotid stenosis    a. Carotid US 9/15 - Bilateral 1-39% ICA >> FU 2 years // b. Bilateral ICA 1-39 >> FU prn // Carotid US 06/2018: bilat 1-39; fu prn  . Dyspnea    due to Sarcoidosis  . Elbow fracture 04/2017   Right, had surgery  . Esophageal reflux   . Hematochezia   . History of echocardiogram    a. Echo 10/15 - EF 60-65%, no RWMA  . HLD (hyperlipidemia)   . Hx of colonoscopy   . Osteoporosis   . Other chronic pulmonary heart diseases   . Paroxysmal supraventricular tachycardia ([place])   . Pneumonia   . Sarcoidosis     Past Surgical History:  Procedure Laterality Date  . arm surgery Right   . BLADDER SURGERY    . CARDIAC CATHETERIZATION N/A 05/20/2015   Procedure: Left Heart Cath and Coronary Angiography;  Surgeon: Sherren Mocha, MD;  Location: [facility];  Service: Cardiovascular;  Laterality: N/A;  . CARPAL TUNNEL RELEASE Left   . COLONOSCOPY    . FOOT SURGERY Right   . HIP SURGERY Right 2011   full replacement  . LEG SURGERY Left May 2014   femur fracture s/p open and closed reduction in [place], Dr. Jimmye Norman  . ORIF ELBOW FRACTURE Right 05/09/2017   Procedure: ORIF right olecranon fracture with  repair/reconstruction, ulnar nerve transposition as needed;  Surgeon: Roseanne Kaufman, MD;  Location: [facility];  Service: Orthopedics;  Laterality: Right;  Requests 90 mins  . pelvis fracture    . POLYPECTOMY    . SHOULDER ARTHROSCOPY W/ ROTATOR CUFF REPAIR Right   . TRAPEZIUM RESECTION Right     Family History  Problem Relation Age of Onset  . Colon cancer Mother 104  . Cancer Mother   . Lung cancer Father   . Heart disease Father   . Arrhythmia Father   . Cancer Father   . Cancer Sister        ovarian  . Heart attack Brother   . Esophageal cancer Neg Hx   . Rectal cancer Neg Hx   . Stomach cancer Neg Hx   . Stroke Neg Hx     Social History:  reports that she has never smoked. She has never used smokeless tobacco. She reports that she does not drink alcohol or use drugs.   Medications Prior to Admission  Medication Sig Dispense Refill  . acetaminophen (TYLENOL) 500 MG tablet Take 1,000 mg by mouth every 6 (six) hours as needed for mild pain.     Marland Kitchen albuterol (VENTOLIN HFA) 108 (90 Base) MCG/ACT inhaler Inhale 2  puffs into the lungs every 4 (four) hours as needed for wheezing or shortness of breath. 6.7 g 1  . aspirin EC 81 MG tablet Take 1 tablet (81 mg total) by mouth daily.    Marland Kitchen buPROPion (WELLBUTRIN) 75 MG tablet Take 75 mg by mouth every morning.    . calcium carbonate (TUMS - DOSED IN MG ELEMENTAL CALCIUM) 500 MG chewable tablet Chew 1 tablet by mouth daily.     Marland Kitchen diltiazem (CARDIZEM CD) 180 MG 24 hr capsule Take 180 mg by mouth at bedtime.     . fluticasone (FLONASE) 50 MCG/ACT nasal spray Place 2 sprays into both nostrils 2 (two) times daily. 16 g 3  . HYDROcodone-acetaminophen (NORCO/VICODIN) 5-325 MG tablet Take 1 tablet by mouth every 6 (six) hours as needed for severe pain.     Marland Kitchen ipratropium (ATROVENT) 0.03 % nasal spray Place 2 sprays into both nostrils 4 (four) times daily as needed for rhinitis. 30 mL 11  . omeprazole (PRILOSEC) 40 MG capsule Take 40 mg by mouth 2  (two) times daily.     . ondansetron (ZOFRAN-ODT) 4 MG disintegrating tablet Take 4 mg by mouth every 4 (four) hours as needed for nausea.     . psyllium (METAMUCIL) 58.6 % powder Take 1 packet by mouth daily.    . rosuvastatin (CRESTOR) 10 MG tablet Take 10 mg by mouth 3 (three) times a week.    . senna-docusate (SENOKOT-S) 8.6-50 MG tablet Take 1 tablet by mouth at bedtime.    . solifenacin (VESICARE) 5 MG tablet Take 5 mg by mouth daily.    . Vitamin D, Ergocalciferol, (DRISDOL) 50000 units CAPS capsule Take 50,000 Units by mouth every Friday.     . denosumab (PROLIA) 60 MG/ML SOLN injection Inject 60 mg into the skin every 6 (six) months. Administer in upper arm, thigh, or abdomen    . nitroGLYCERIN (NITROSTAT) 0.4 MG SL tablet Place 1 tablet (0.4 mg total) under the tongue every 5 (five) minutes as needed for chest pain. 25 tablet 11  . Respiratory Therapy Supplies (FLUTTER) DEVI Use as directed 1 each 0     Physical Exam: Left shoulder demonstrates painful and guarded motion as noted at her recent office visits.  Plain radiographs confirm changes consistent with rotator cuff tear arthropathy, and MRI scan confirms a massive and chronically retracted rotator cuff tear.  Vitals  Temp:  [98 F (36.7 C)] 98 F (36.7 C) (04/22 0818) Pulse Rate:  [66] 66 (04/22 0818) Resp:  [16] 16 (04/22 0818) BP: (132)/(55) 132/55 (04/22 0818) SpO2:  [99 %] 99 % (04/22 0818)  Assessment/Plan  Impression: left shoulder rotator cuff tear arthropathy  Plan of Action: Procedure(s): REVERSE SHOULDER ARTHROPLASTY  Enoch Moffa M Harlow Basley 06/13/2019, 9:01 AM Contact # [phone]

## 2019-06-13 NOTE — Discharge Instructions (Signed)
 Kevin M. Supple, M.D., F.A.A.O.S. Orthopa[facility] Specializing in Arthroscopic and Reconstructive Surgery of the Shoulder [phone] [address] - [place], [place] [address] - Fax [phone]   POST-OP TOTAL SHOULDER REPLACEMENT INSTRUCTIONS  1. Follow up in the office for your first post-op appointment 10-14 days from the date of your surgery. If you do not already have a scheduled appointment, our office will contact you to schedule.  2. The bandage over your incision is waterproof. You may begin showering with this dressing on. You may leave this dressing on until first follow up appointment within 2 weeks. We prefer you leave this dressing in place until follow up however after 5-7 days if you are having itching or skin irritation and would like to remove it you may do so. Go slow and tug at the borders gently to break the bond the dressing has with the skin. At this point if there is no drainage it is okay to go without a bandage or you may cover it with a light guaze and tape. You can also expect significant bruising around your shoulder that will drift down your arm and into your chest wall. This is very normal and should resolve over several days.   3. Wear your sling/immobilizer at all times except to perform the exercises below or to occasionally let your arm dangle by your side to stretch your elbow. You also need to sleep in your sling immobilizer until instructed otherwise. It is ok to remove your sling if you are sitting in a controlled environment and allow your arm to rest in a position of comfort by your side or on your lap with pillows to give your neck and skin a break from the sling. You may remove it to allow arm to dangle by side to shower. If you are up walking around and when you go to sleep at night you need to wear it.  4. Range of motion to your elbow, wrist, and hand are encouraged 3-5 times daily. Exercise to your hand and fingers helps to reduce  swelling you may experience.   5. Prescriptions for a pain medication and a muscle relaxant are provided for you. It is recommended that if you are experiencing pain that you pain medication alone is not controlling, add the muscle relaxant along with the pain medication which can give additional pain relief. The first 1-2 days is generally the most severe of your pain and then should gradually decrease. As your pain lessens it is recommended that you decrease your use of the pain medications to an "as needed basis'" only and to always comply with the recommended dosages of the pain medications.  6. Pain medications can produce constipation along with their use. If you experience this, the use of an over the counter stool softener or laxative daily is recommended.   7. For additional questions or concerns, please do not hesitate to call the office. If after hours there is an answering service to forward your concerns to the physician on call.  8.Pain control following an exparel block  To help control your post-operative pain you received a nerve block  performed with Exparel which is a long acting anesthetic (numbing agent) which can provide pain relief and sensations of numbness (and relief of pain) in the operative shoulder and arm for up to 3 days. Sometimes it provides mixed relief, meaning you may still have numbness in certain areas of the arm but can still be able to   move  parts of that arm, hand, and fingers. We recommend that your prescribed pain medications  be used as needed. We do not feel it is necessary to "pre medicate" and "stay ahead" of pain.  Taking narcotic pain medications when you are not having any pain can lead to unnecessary and potentially dangerous side effects.    9. Use the ice machine as much as possible in the first 5-7 days from surgery, then you can wean its use to as needed. The ice typically needs to be replaced every 6 hours, instead of ice you can actually freeze  water bottles to put in the cooler and then fill water around them to avoid having to purchase ice. You can have spare water bottles freezing to allow you to rotate them once they have melted. Try to have a thin shirt or light cloth or towel under the ice wrap to protect your skin.   10.  We recommend that you avoid any dental work or cleaning in the first 3 months following your joint replacement. This is to help minimize the possibility of infection from the bacteria in your mouth that enters your bloodstream during dental work. We also recommend that you take an antibiotic prior to your dental work for the first year after your shoulder replacement to further help reduce that risk. Please simply contact our office for antibiotics to be sent to your pharmacy prior to dental work.  POST-OP EXERCISES  Pendulum Exercises  Perform pendulum exercises while standing and bending at the waist. Support your uninvolved arm on a table or chair and allow your operated arm to hang freely. Make sure to do these exercises passively - not using you shoulder muscles. These exercises can be performed once your nerve block effects have worn off.  Repeat 20 times. Do 3 sessions per day.

## 2019-06-13 NOTE — Transfer of Care (Signed)
Immediate Anesthesia Transfer of Care Note  Patient: [name]  Procedure(s) Performed: REVERSE SHOULDER ARTHROPLASTY (Left Shoulder)  Patient Location: PACU  Anesthesia Type:GA combined with regional for post-op pain  Level of Consciousness: sedated, patient cooperative and responds to stimulation  Airway & Oxygen Therapy: Patient Spontanous Breathing and Patient connected to face mask oxygen  Post-op Assessment: Report given to RN and Post -op Vital signs reviewed and stable  Post vital signs: Reviewed and stable  Last Vitals:  Vitals Value Taken Time  BP 158/63 06/13/19 1125  Temp    Pulse 67 06/13/19 1127  Resp 31 06/13/19 1127  SpO2 100 % 06/13/19 1127  Vitals shown include unvalidated device data.  Last Pain:  Vitals:   06/13/19 0818  TempSrc: Oral         Complications: No apparent anesthesia complications

## 2019-06-14 DIAGNOSIS — M75102 Unspecified rotator cuff tear or rupture of left shoulder, not specified as traumatic: Secondary | ICD-10-CM | POA: Diagnosis not present

## 2019-06-14 MED ORDER — OXYCODONE-ACETAMINOPHEN 5-325 MG PO TABS: 1.0000 | ORAL_TABLET | ORAL | 0 refills | Status: DC | PRN

## 2019-06-14 MED ORDER — ONDANSETRON HCL 4 MG PO TABS: 4.0000 mg | ORAL_TABLET | Freq: Three times a day (TID) | ORAL | 0 refills | Status: DC | PRN

## 2019-06-14 NOTE — Progress Notes (Signed)
Occupational Therapy Treatment Patient Details Name: BLAKELYNN SEKELSKY MRN: [id number] DOB: Feb 19, 1935 Today's Date: 06/14/2019    History of present illness Patient is an 85 year old woman s/p  left reverse total shoulder arthroplasty POD 1.   OT comments  Patient's daughter present for afternoon treatment and will be patient's caregiver at home. Educated patient's daughter and reiterated for patient, who did well with recall, on positioning of UE, management of sling, ROM limitations, how to manage arm with bathing and dressing, and exercises. Patient and daughter verbalize understanding. Handouts provided. No further OT needs at this time. F/u with MD for any further needs.   Follow Up Recommendations  Follow surgeon's recommendation for DC plan and follow-up therapies    Equipment Recommendations  (Patient has BSC and shower chair to use at home.)    Recommendations for Other Services      Precautions / Restrictions Precautions Precautions: Shoulder Shoulder Interventions: Shoulder sling/immobilizer Precaution Booklet Issued: No Restrictions Weight Bearing Restrictions: Yes LUE Weight Bearing: Non weight bearing       Mobility Bed Mobility                  Transfers                      Balance                                           ADL either performed or assessed with clinical judgement   ADL Overall ADL's : Needs assistance/impaired                                             Vision       Perception     Praxis      Cognition                                                Exercises     Shoulder Instructions Shoulder Instructions Donning/doffing shirt without moving shoulder: Caregiver independent with task;Patient able to independently direct caregiver Method for sponge bathing under operated UE: Caregiver independent with task;Patient able to independently direct  caregiver Donning/doffing sling/immobilizer: Caregiver independent with task;Patient able to independently direct caregiver Correct positioning of sling/immobilizer: Caregiver independent with task;Patient able to independently direct caregiver Pendulum exercises (written home exercise program): Caregiver independent with task ROM for elbow, wrist and digits of operated UE: Caregiver independent with task;Patient able to independently direct caregiver Sling wearing schedule (on at all times/off for ADL's): Caregiver independent with task;Patient able to independently direct caregiver Proper positioning of operated UE when showering: Caregiver independent with task;Patient able to independently direct caregiver Positioning of UE while sleeping: Caregiver independent with task;Patient able to independently direct caregiver     General Comments      Pertinent Vitals/ Pain       Pain Assessment: Faces Faces Pain Scale: Hurts little more Pain Location: Left shoulder Pain Descriptors / Indicators: Grimacing Pain Intervention(s): Monitored during session;Limited activity within patient's tolerance  Home Living  Prior Functioning/Environment              Frequency           Progress Toward Goals  OT Goals(current goals can now be found in the care plan section)        Plan      Co-evaluation                 AM-PAC OT "6 Clicks" Daily Activity     Outcome Measure   Help from another person eating meals?: A Little Help from another person taking care of personal grooming?: A Little Help from another person toileting, which includes using toliet, bedpan, or urinal?: A Lot Help from another person bathing (including washing, rinsing, drying)?: A Lot Help from another person to put on and taking off regular upper body clothing?: A Lot Help from another person to put on and taking off regular lower body  clothing?: A Lot 6 Click Score: 14    End of Session    OT Visit Diagnosis: Pain;Muscle weakness (generalized) (M62.81) Pain - Right/Left: Left Pain - part of body: Shoulder   Activity Tolerance Patient tolerated treatment well   Patient Left in chair;with call bell/phone within reach;with family/visitor present   Nurse Communication Other (comment)(Patient okay to discharge .)        Time: [id number] OT Time Calculation (min): 15 min  Charges: OT Treatments $Self Care/Home Management : 8-22 mins  Derl Barrow, OTR/L Acute Care Rehab Services  Office [phone]    Lenward Chancellor 06/14/2019, 4:42 PM

## 2019-06-14 NOTE — Plan of Care (Signed)
Pt ready for DC home 

## 2019-06-14 NOTE — Evaluation (Signed)
Occupational Therapy Evaluation Patient Details Name: [name] MRN: [id number] DOB: 1934/04/09 Today's Date: 06/14/2019    History of Present Illness Patient is an 85 year old woman s/p  left reverse total shoulder arthroplasty POD 1.   Clinical Impression   [name] presents with decreased ROM and strength of left shoulder s/p reverse shoulder arthroplasty and complaints of pain. Therapist provided education and instruction to patient  in regards to exercises, precautions, positioning, donning upper extremity clothing and bathing while maintaining shoulder precautions, ice and edema management and donning/doffing sling. Patient verbalized understanding and demonstrated as needed. However, patient needed consistent verbal cues for positioning of upper extremity. Therapist will come back to reiterate education to patient and her caregiver.       Follow Up Recommendations  Follow surgeon's recommendation for DC plan and follow-up therapies    Equipment Recommendations       Recommendations for Other Services       Precautions / Restrictions Precautions Precautions: Shoulder Shoulder Interventions: Shoulder sling/immobilizer Precaution Booklet Issued: No Restrictions Weight Bearing Restrictions: Yes LUE Weight Bearing: Non weight bearing      Mobility Bed Mobility               General bed mobility comments: Min assist to transfer to side of bed - unable to push herself with RUE into sitting. Min assist for hand hold to scoot forward to edge of bed.  Transfers                 General transfer comment: cga for transfers for safety only.    Balance                                           ADL either performed or assessed with clinical judgement   ADL Overall ADL's : Needs assistance/impaired Eating/Feeding: Set up   Grooming: Set up   Upper Body Bathing: Moderate assistance   Lower Body Bathing: Moderate assistance    Upper Body Dressing : Maximal assistance Upper Body Dressing Details (indicate cue type and reason): needed assistance to donn bra and shirt, as well as to maintain LUe positioning. Lower Body Dressing: Maximal assistance Lower Body Dressing Details (indicate cue type and reason): Needed assistance to donn underwear, pants, socks and shoes. Toilet Transfer: Designer, fashion/clothing and Hygiene: Minimal assistance       Functional mobility during ADLs: Min guard       Vision         Perception     Praxis      Pertinent Vitals/Pain Pain Assessment: Faces Faces Pain Scale: Hurts a little bit Pain Location: left shoulder. initially no pain but then reported mild pain at end of session. Pain Intervention(s): RN gave pain meds during session     Hand Dominance Right   Extremity/Trunk Assessment Upper Extremity Assessment Upper Extremity Assessment: LUE deficits/detail LUE Deficits / Details: limited by decreased shoulder ROM/strength and shoulder precautions.       Cervical / Trunk Assessment Cervical / Trunk Assessment: Kyphotic   Communication Communication Communication: No difficulties   Cognition Arousal/Alertness: Awake/alert Behavior During Therapy: WFL for tasks assessed/performed Overall Cognitive Status: Within Functional Limits for tasks assessed  General Comments       Exercises Shoulder Exercises Elbow Flexion: 5 reps;PROM Elbow Extension: 5 reps;AROM Wrist Flexion: 5 reps;AROM Wrist Extension: 5 reps;AROM Digit Composite Flexion: 5 reps;AROM Composite Extension: AROM;5 reps   Shoulder Instructions Shoulder Instructions Donning/doffing shirt without moving shoulder: Maximal assistance Method for sponge bathing under operated UE: Supervision/safety Donning/doffing sling/immobilizer: Minimal assistance Correct positioning of sling/immobilizer: Minimal assistance Pendulum  exercises (written home exercise program): Moderate assistance ROM for elbow, wrist and digits of operated UE: Supervision/safety Sling wearing schedule (on at all times/off for ADL's): Supervision/safety Proper positioning of operated UE when showering: Supervision/safety Positioning of UE while sleeping: H. Rivera Colon expects to be discharged to:: Private residence Living Arrangements: Children   Type of Home: House Home Access: Stairs to enter Technical brewer of Steps: 1   Home Layout: One level     Bathroom Shower/Tub: Occupational psychologist: Standard Bathroom Accessibility: Yes   Home Equipment: (shower seat)   Additional Comments: lives with daughter and son in [place]. daughter works from home.      Prior Functioning/Environment Level of Independence: Independent                 OT Problem List: Decreased strength;Decreased knowledge of precautions;Impaired UE functional use;Decreased range of motion      OT Treatment/Interventions: Self-care/ADL training;Therapeutic exercise;Patient/family education;Therapeutic activities;DME and/or AE instruction    OT Goals(Current goals can be found in the care plan section) Acute Rehab OT Goals Patient Stated Goal: TO use arm more OT Goal Formulation: With patient Time For Goal Achievement: 06/27/19 Potential to Achieve Goals: Good  OT Frequency: Min 2X/week   Barriers to D/C:            Co-evaluation              AM-PAC OT "6 Clicks" Daily Activity     Outcome Measure   Help from another person taking care of personal grooming?: A Little Help from another person toileting, which includes using toliet, bedpan, or urinal?: A Little Help from another person bathing (including washing, rinsing, drying)?: A Little Help from another person to put on and taking off regular upper body clothing?: A Lot Help from another person to put on and taking off regular lower  body clothing?: A Lot 6 Click Score: 13   End of Session Equipment Utilized During Treatment: Other (comment)(sling) Nurse Communication: (Discussed with RN for OT to come back to see patient again when caregiver present)  Activity Tolerance: Patient tolerated treatment well Patient left: in chair;with call bell/phone within reach  OT Visit Diagnosis: Pain;Muscle weakness (generalized) (M62.81) Pain - Right/Left: Left Pain - part of body: Shoulder                Time: [id number] OT Time Calculation (min): 51 min Charges:  OT General Charges $OT Visit: 1 Visit OT Evaluation $OT Eval Moderate Complexity: 1 Mod OT Treatments $Self Care/Home Management : 38-52 mins  Kathleen Tamm, OTR/L Acute Care Rehab Services  Office [phone]   Lenward Chancellor 06/14/2019, 11:34 AM

## 2019-06-14 NOTE — Discharge Summary (Signed)
PATIENT ID:      [name]  MRN:     [id number] DOB/AGE:    1934/09/24 / 85 y.o.     DISCHARGE SUMMARY  ADMISSION DATE:    06/13/2019 DISCHARGE DATE:    ADMISSION DIAGNOSIS: left shoulder rotator cuff tear arthropathy Past Medical History:  Diagnosis Date  . Allergy    SEASONAL  . Arthritis   . Asthma    "slight"   . Baker's cyst of knee, right   . CAD (coronary artery disease)    a. Myoview 10/15 - normal EF 70% // Myoview 11/16: EF 75%, normal perfusion, (there was no blood pressure drop at low level exercise with Lexiscan infusion), low risk study // c. LHC 3/17 - LAD irregs, oLCx 70 (neg FFR), mRCA 30, EF 55-65% >> med Rx // Myoview 07/2018:  EF 73, extracardiac uptake, no significant ischemia (reviewed with Dr. Burt Knack), Low Risk   . Carotid stenosis    a. Carotid US 9/15 - Bilateral 1-39% ICA >> FU 2 years // b. Bilateral ICA 1-39 >> FU prn // Carotid US 06/2018: bilat 1-39; fu prn  . Dyspnea    due to Sarcoidosis  . Elbow fracture 04/2017   Right, had surgery  . Esophageal reflux   . Hematochezia   . History of echocardiogram    a. Echo 10/15 - EF 60-65%, no RWMA  . HLD (hyperlipidemia)   . Hx of colonoscopy   . Osteoporosis   . Other chronic pulmonary heart diseases   . Paroxysmal supraventricular tachycardia ([place])   . Pneumonia   . Sarcoidosis     DISCHARGE DIAGNOSIS:   Active Problems:   S/P reverse total shoulder arthroplasty, left   PROCEDURE: Procedure(s): REVERSE SHOULDER ARTHROPLASTY on 06/13/2019  CONSULTS:    HISTORY:  See H&P in chart.  HOSPITAL COURSE:  [name] is a 85 y.o. admitted on 06/13/2019 with a diagnosis of left shoulder rotator cuff tear arthropathy.  They were brought to the operating room on 06/13/2019 and underwent Procedure(s): [place].    They were given perioperative antibiotics:  Anti-infectives (From admission, onward)   Start     Dose/Rate Route Frequency Ordered Stop   06/13/19 0803  ceFAZolin  (ANCEF) 2-4 GM/100ML-% IVPB    Note to Pharmacy: Charmayne Sheer   : cabinet override      06/13/19 0803 06/13/19 1006   06/13/19 0800  ceFAZolin (ANCEF) IVPB 2g/100 mL premix     2 g 200 mL/hr over 30 Minutes Intravenous On call to O.R. 06/13/19 0757 06/13/19 1003    .  Patient underwent the above named procedure and tolerated it well. The following day they were hemodynamically stable and pain was controlled on oral analgesics. They were neurovascularly intact to the operative extremity. OT was ordered and worked with patient per protocol. They were medically and orthopaedically stable for discharge on .    DIAGNOSTIC STUDIES:  RECENT RADIOGRAPHIC STUDIES :  No results found.  RECENT VITAL SIGNS:   Patient Vitals for the past 24 hrs:  BP Temp Temp src Pulse Resp SpO2 Height Weight  06/14/19 0558 (!) 142/63 98.6 F (37 C) Oral 77 18 97 % - -  06/14/19 0248 128/74 98.6 F (37 C) Oral 80 20 95 % - -  06/13/19 2054 (!) 143/58 98.1 F (36.7 C) Oral 80 20 96 % - -  06/13/19 1645 (!) 153/61 97.6 F (36.4 C) Oral 79 16 97 % - -  06/13/19  1545 (!) 141/54 98 F (36.7 C) Oral 79 16 100 % - -  06/13/19 1448 (!) 141/62 - - 76 16 100 % - -  06/13/19 1320 - - - - - - 5\' 3"  (1.6 m) 56.3 kg  06/13/19 1245 (!) 146/63 (!) 97.4 F (36.3 C) - 70 18 99 % - -  06/13/19 1230 (!) 153/64 - - 67 14 100 % - -  06/13/19 1215 (!) 153/62 (!) 97.4 F (36.3 C) - 66 16 100 % - -  06/13/19 1200 (!) 150/87 (!) 95.9 F (35.5 C) - 70 16 99 % - -  06/13/19 1145 (!) 130/107 (!) 95.6 F (35.3 C) - 71 15 93 % - -  06/13/19 1130 (!) 158/57 - - 68 17 100 % - -  06/13/19 1126 - - - 66 (!) 21 100 % - -  06/13/19 1125 (!) 158/63 (!) 95.5 F (35.3 C) - - 19 100 % - -  06/13/19 0927 - - - (!) 47 (!) 22 (!) 83 % - -  06/13/19 0926 - - - 71 (!) 22 100 % - -  06/13/19 0925 (!) 152/79 - - 93 19 99 % - -  06/13/19 0924 - - - 94 19 99 % - -  06/13/19 0923 - - - (!) 102 (!) 21 99 % - -  06/13/19 0922 - - - - (!) 28 - -  -  06/13/19 0921 - - - - (!) 39 - - -  06/13/19 0920 (!) 163/78 - - 98 17 100 % - -  06/13/19 0919 - - - (!) 105 (!) 25 100 % - -  06/13/19 0918 - - - 96 (!) 21 100 % - -  06/13/19 0917 - - - 92 (!) 21 100 % - -  06/13/19 0916 - - - - 20 - - -  06/13/19 0915 (!) 160/65 - - 93 19 100 % - -  06/13/19 0914 - - - 91 (!) 23 100 % - -  06/13/19 0913 - - - 89 (!) 27 100 % - -  06/13/19 0912 - - - 86 (!) 21 100 % - -  06/13/19 0911 - - - 89 (!) 22 100 % - -  06/13/19 0910 (!) 146/87 - - 78 14 100 % - -  06/13/19 0909 - - - 72 (!) 23 100 % - -  06/13/19 0908 - - - 73 (!) 22 100 % - -  06/13/19 0907 - - - 71 (!) 22 100 % - -  06/13/19 0906 - - - 73 18 100 % - -  06/13/19 0905 (!) 164/67 - - 72 14 100 % - -  06/13/19 0904 - - - 70 20 100 % - -  06/13/19 0903 - - - 70 (!) 25 100 % - -  06/13/19 0902 - - - 67 20 100 % - -  06/13/19 0901 - - - 69 15 100 % - -  06/13/19 0900 (!) 164/62 - - 70 17 100 % - -  06/13/19 0859 - - - 71 20 100 % - -  06/13/19 0858 - - - 74 15 97 % - -  06/13/19 0857 (!) 160/68 - - 72 15 100 % - -  .  RECENT EKG RESULTS:    Orders placed or performed in visit on 05/31/19  . EKG 12-Lead    DISCHARGE INSTRUCTIONS:    DISCHARGE MEDICATIONS:   Allergies as of 06/14/2019  Reactions   Levaquin [levofloxacin] Other (See Comments)   Leg pain .Marland Kitchen Per doctor not to take again   Sulfonamide Derivatives    Pt unsure   Morphine Nausea Only   Oxycodone-acetaminophen Other (See Comments)   "talking out of my head"   Percocet [oxycodone-acetaminophen] Nausea And Vomiting      Medication List    STOP taking these medications   HYDROcodone-acetaminophen 5-325 MG tablet Commonly known as: NORCO/VICODIN     TAKE these medications   acetaminophen 500 MG tablet Commonly known as: TYLENOL Take 1,000 mg by mouth every 6 (six) hours as needed for mild pain.   albuterol 108 (90 Base) MCG/ACT inhaler Commonly known as: VENTOLIN HFA Inhale 2 puffs into the lungs every  4 (four) hours as needed for wheezing or shortness of breath.   aspirin EC 81 MG tablet Take 1 tablet (81 mg total) by mouth daily.   buPROPion 75 MG tablet Commonly known as: WELLBUTRIN Take 75 mg by mouth every morning.   calcium carbonate 500 MG chewable tablet Commonly known as: TUMS - dosed in mg elemental calcium Chew 1 tablet by mouth daily.   denosumab 60 MG/ML Soln injection Commonly known as: PROLIA Inject 60 mg into the skin every 6 (six) months. Administer in upper arm, thigh, or abdomen   diltiazem 180 MG 24 hr capsule Commonly known as: CARDIZEM CD Take 180 mg by mouth at bedtime.   fluticasone 50 MCG/ACT nasal spray Commonly known as: FLONASE Place 2 sprays into both nostrils 2 (two) times daily.   Flutter Devi Use as directed   ipratropium 0.03 % nasal spray Commonly known as: ATROVENT Place 2 sprays into both nostrils 4 (four) times daily as needed for rhinitis.   nitroGLYCERIN 0.4 MG SL tablet Commonly known as: NITROSTAT Place 1 tablet (0.4 mg total) under the tongue every 5 (five) minutes as needed for chest pain.   omeprazole 40 MG capsule Commonly known as: PRILOSEC Take 40 mg by mouth 2 (two) times daily.   ondansetron 4 MG disintegrating tablet Commonly known as: ZOFRAN-ODT Take 4 mg by mouth every 4 (four) hours as needed for nausea.   ondansetron 4 MG tablet Commonly known as: Zofran Take 1 tablet (4 mg total) by mouth every 8 (eight) hours as needed for nausea or vomiting (take along with pain med).   oxyCODONE-acetaminophen 5-325 MG tablet Commonly known as: Percocet Take 1 tablet by mouth every 4 (four) hours as needed (max 6 q for post op pain).   psyllium 58.6 % powder Commonly known as: METAMUCIL Take 1 packet by mouth daily.   rosuvastatin 10 MG tablet Commonly known as: CRESTOR Take 10 mg by mouth 3 (three) times a week.   senna-docusate 8.6-50 MG tablet Commonly known as: Senokot-S Take 1 tablet by mouth at bedtime.    solifenacin 5 MG tablet Commonly known as: VESICARE Take 5 mg by mouth daily.   Vitamin D (Ergocalciferol) 1.25 MG (50000 UNIT) Caps capsule Commonly known as: DRISDOL Take 50,000 Units by mouth every Friday.       FOLLOW UP VISIT:   Follow-up Information    Justice Britain, MD.   Specialty: Orthopedic Surgery Why: in 10-14 days Contact information: [address] [place] [address] [id number]           DISCHARGE TO: Home   DISCHARGE CONDITION:  Thereasa Parkin Denica Web for Dr. Justice Britain 06/14/2019, 8:37 AM

## 2019-06-18 ENCOUNTER — Encounter: Payer: Self-pay | Admitting: *Deleted

## 2019-07-11 DIAGNOSIS — M25532 Pain in left wrist: Secondary | ICD-10-CM | POA: Insufficient documentation

## 2019-07-18 ENCOUNTER — Inpatient Hospital Stay (HOSPITAL_COMMUNITY)
Admission: EM | Admit: 2019-07-18 | Discharge: 2019-07-24 | DRG: 871 | Disposition: A | Discharge status: Home / Self Care | Payer: Medicare Other | Source: Ambulatory Visit | Attending: Internal Medicine | Admitting: Internal Medicine

## 2019-07-18 ENCOUNTER — Encounter (HOSPITAL_COMMUNITY): Payer: Self-pay

## 2019-07-18 ENCOUNTER — Observation Stay (HOSPITAL_COMMUNITY): Payer: Medicare Other

## 2019-07-18 ENCOUNTER — Other Ambulatory Visit: Payer: Self-pay

## 2019-07-18 ENCOUNTER — Emergency Department (HOSPITAL_COMMUNITY): Payer: Medicare Other

## 2019-07-18 DIAGNOSIS — R0600 Dyspnea, unspecified: Secondary | ICD-10-CM

## 2019-07-18 DIAGNOSIS — Z79899 Other long term (current) drug therapy: Secondary | ICD-10-CM

## 2019-07-18 DIAGNOSIS — I5033 Acute on chronic diastolic (congestive) heart failure: Secondary | ICD-10-CM | POA: Diagnosis present

## 2019-07-18 DIAGNOSIS — I248 Other forms of acute ischemic heart disease: Secondary | ICD-10-CM | POA: Diagnosis present

## 2019-07-18 DIAGNOSIS — A4151 Sepsis due to Escherichia coli [E. coli]: Principal | ICD-10-CM | POA: Diagnosis present

## 2019-07-18 DIAGNOSIS — A419 Sepsis, unspecified organism: Secondary | ICD-10-CM | POA: Diagnosis present

## 2019-07-18 DIAGNOSIS — Z7982 Long term (current) use of aspirin: Secondary | ICD-10-CM

## 2019-07-18 DIAGNOSIS — J479 Bronchiectasis, uncomplicated: Secondary | ICD-10-CM

## 2019-07-18 DIAGNOSIS — D509 Iron deficiency anemia, unspecified: Secondary | ICD-10-CM | POA: Diagnosis present

## 2019-07-18 DIAGNOSIS — Z8249 Family history of ischemic heart disease and other diseases of the circulatory system: Secondary | ICD-10-CM

## 2019-07-18 DIAGNOSIS — D72829 Elevated white blood cell count, unspecified: Secondary | ICD-10-CM | POA: Insufficient documentation

## 2019-07-18 DIAGNOSIS — G9341 Metabolic encephalopathy: Secondary | ICD-10-CM | POA: Diagnosis present

## 2019-07-18 DIAGNOSIS — Z882 Allergy status to sulfonamides status: Secondary | ICD-10-CM

## 2019-07-18 DIAGNOSIS — R5381 Other malaise: Secondary | ICD-10-CM | POA: Diagnosis present

## 2019-07-18 DIAGNOSIS — Z800 Family history of malignant neoplasm of digestive organs: Secondary | ICD-10-CM

## 2019-07-18 DIAGNOSIS — N390 Urinary tract infection, site not specified: Secondary | ICD-10-CM | POA: Diagnosis present

## 2019-07-18 DIAGNOSIS — I471 Supraventricular tachycardia, unspecified: Secondary | ICD-10-CM | POA: Diagnosis present

## 2019-07-18 DIAGNOSIS — Z20822 Contact with and (suspected) exposure to covid-19: Secondary | ICD-10-CM | POA: Diagnosis present

## 2019-07-18 DIAGNOSIS — Z96612 Presence of left artificial shoulder joint: Secondary | ICD-10-CM | POA: Diagnosis present

## 2019-07-18 DIAGNOSIS — E785 Hyperlipidemia, unspecified: Secondary | ICD-10-CM | POA: Diagnosis present

## 2019-07-18 DIAGNOSIS — Z801 Family history of malignant neoplasm of trachea, bronchus and lung: Secondary | ICD-10-CM

## 2019-07-18 DIAGNOSIS — I2510 Atherosclerotic heart disease of native coronary artery without angina pectoris: Secondary | ICD-10-CM | POA: Diagnosis present

## 2019-07-18 DIAGNOSIS — N3000 Acute cystitis without hematuria: Secondary | ICD-10-CM

## 2019-07-18 DIAGNOSIS — Z79891 Long term (current) use of opiate analgesic: Secondary | ICD-10-CM

## 2019-07-18 DIAGNOSIS — R1310 Dysphagia, unspecified: Secondary | ICD-10-CM | POA: Insufficient documentation

## 2019-07-18 DIAGNOSIS — D869 Sarcoidosis, unspecified: Secondary | ICD-10-CM | POA: Diagnosis present

## 2019-07-18 DIAGNOSIS — I272 Pulmonary hypertension, unspecified: Secondary | ICD-10-CM | POA: Diagnosis present

## 2019-07-18 DIAGNOSIS — I110 Hypertensive heart disease with heart failure: Secondary | ICD-10-CM | POA: Diagnosis present

## 2019-07-18 HISTORY — DX: Sepsis, unspecified organism: A41.9

## 2019-07-18 LAB — URINALYSIS, ROUTINE W REFLEX MICROSCOPIC
Bilirubin Urine: NEGATIVE
Glucose, UA: NEGATIVE mg/dL
Ketones, ur: 5 mg/dL — AB
Nitrite: POSITIVE — AB
Protein, ur: 100 mg/dL — AB
Specific Gravity, Urine: 1.021 (ref 1.005–1.030)
pH: 5 (ref 5.0–8.0)

## 2019-07-18 LAB — COMPREHENSIVE METABOLIC PANEL
ALT: 17 U/L (ref 0–44)
AST: 25 U/L (ref 15–41)
Albumin: 3 g/dL — ABNORMAL LOW (ref 3.5–5.0)
Alkaline Phosphatase: 81 U/L (ref 38–126)
Anion gap: 10 (ref 5–15)
BUN: 26 mg/dL — ABNORMAL HIGH (ref 8–23)
CO2: 23 mmol/L (ref 22–32)
Calcium: 8.7 mg/dL — ABNORMAL LOW (ref 8.9–10.3)
Chloride: 101 mmol/L (ref 98–111)
Creatinine, Ser: 0.73 mg/dL (ref 0.44–1.00)
GFR calc Af Amer: >60 mL/min (ref >60)
GFR calc non Af Amer: >60 mL/min (ref >60)
Glucose, Bld: 99 mg/dL (ref 70–99)
Potassium: 3.8 mmol/L (ref 3.5–5.1)
Sodium: 134 mmol/L — ABNORMAL LOW (ref 135–145)
Total Bilirubin: 0.8 mg/dL (ref 0.3–1.2)
Total Protein: 5.5 g/dL — ABNORMAL LOW (ref 6.5–8.1)

## 2019-07-18 LAB — CBC
HCT: 35.8 % — ABNORMAL LOW (ref 36.0–46.0)
Hemoglobin: 11.5 g/dL — ABNORMAL LOW (ref 12.0–15.0)
MCH: 31.3 pg (ref 26.0–34.0)
MCHC: 32.1 g/dL (ref 30.0–36.0)
MCV: 97.5 fL (ref 80.0–100.0)
Platelets: 162 10*3/uL (ref 150–400)
RBC: 3.67 MIL/uL — ABNORMAL LOW (ref 3.87–5.11)
RDW: 14 % (ref 11.5–15.5)
WBC: 20.7 10*3/uL — ABNORMAL HIGH (ref 4.0–10.5)
nRBC: 0 % (ref 0.0–0.2)

## 2019-07-18 LAB — LACTIC ACID, PLASMA: Lactic Acid, Venous: 1 mmol/L (ref 0.5–1.9)

## 2019-07-18 LAB — SARS CORONAVIRUS 2 BY RT PCR (HOSPITAL ORDER, PERFORMED IN ~~LOC~~ HOSPITAL LAB): SARS Coronavirus 2: NEGATIVE

## 2019-07-18 MED ORDER — ONDANSETRON HCL 4 MG PO TABS: 4.0000 mg | ORAL_TABLET | Freq: Four times a day (QID) | ORAL | Status: DC | PRN

## 2019-07-18 MED ORDER — SODIUM CHLORIDE 0.9 % IV BOLUS
1000.0000 mL | Freq: Once | INTRAVENOUS | Status: AC
  Administered 2019-07-18: 1000 mL via INTRAVENOUS

## 2019-07-18 MED ORDER — ALBUTEROL SULFATE (2.5 MG/3ML) 0.083% IN NEBU
3.0000 mL | INHALATION_SOLUTION | RESPIRATORY_TRACT | Status: DC | PRN
  Administered 2019-07-18: 3 mL via RESPIRATORY_TRACT
  Filled 2019-07-18: qty 3

## 2019-07-18 MED ORDER — ONDANSETRON HCL 4 MG/2ML IJ SOLN
4.0000 mg | Freq: Once | INTRAMUSCULAR | Status: AC
  Administered 2019-07-18: 4 mg via INTRAVENOUS
  Filled 2019-07-18: qty 2

## 2019-07-18 MED ORDER — ROSUVASTATIN CALCIUM 5 MG PO TABS
10.0000 mg | ORAL_TABLET | ORAL | Status: DC
  Administered 2019-07-19 – 2019-07-24 (×3): 10 mg via ORAL
  Filled 2019-07-18 (×3): qty 2

## 2019-07-18 MED ORDER — PANTOPRAZOLE SODIUM 40 MG PO TBEC
40.0000 mg | DELAYED_RELEASE_TABLET | Freq: Every day | ORAL | Status: DC
  Administered 2019-07-18 – 2019-07-24 (×7): 40 mg via ORAL
  Filled 2019-07-18 (×7): qty 1

## 2019-07-18 MED ORDER — ACETAMINOPHEN 650 MG RE SUPP: 650.0000 mg | Freq: Four times a day (QID) | RECTAL | Status: DC | PRN

## 2019-07-18 MED ORDER — ASPIRIN EC 81 MG PO TBEC
81.0000 mg | DELAYED_RELEASE_TABLET | Freq: Every day | ORAL | Status: DC
  Administered 2019-07-18 – 2019-07-24 (×7): 81 mg via ORAL
  Filled 2019-07-18 (×7): qty 1

## 2019-07-18 MED ORDER — LACTATED RINGERS IV SOLN: INTRAVENOUS | Status: AC

## 2019-07-18 MED ORDER — ACETAMINOPHEN 500 MG PO TABS
1000.0000 mg | ORAL_TABLET | Freq: Once | ORAL | Status: AC
  Administered 2019-07-18: 1000 mg via ORAL
  Filled 2019-07-18: qty 2

## 2019-07-18 MED ORDER — ONDANSETRON HCL 4 MG/2ML IJ SOLN
4.0000 mg | Freq: Four times a day (QID) | INTRAMUSCULAR | Status: DC | PRN
  Administered 2019-07-20: 4 mg via INTRAVENOUS
  Filled 2019-07-18: qty 2

## 2019-07-18 MED ORDER — ENOXAPARIN SODIUM 40 MG/0.4ML ~~LOC~~ SOLN
40.0000 mg | SUBCUTANEOUS | Status: DC
  Administered 2019-07-18 – 2019-07-23 (×6): 40 mg via SUBCUTANEOUS
  Filled 2019-07-18 (×6): qty 0.4

## 2019-07-18 MED ORDER — SODIUM CHLORIDE 0.9% FLUSH
3.0000 mL | Freq: Two times a day (BID) | INTRAVENOUS | Status: DC
  Administered 2019-07-18 – 2019-07-24 (×11): 3 mL via INTRAVENOUS

## 2019-07-18 MED ORDER — ACETAMINOPHEN 325 MG PO TABS
650.0000 mg | ORAL_TABLET | Freq: Four times a day (QID) | ORAL | Status: DC | PRN
  Administered 2019-07-19 (×2): 650 mg via ORAL
  Filled 2019-07-18 (×2): qty 2

## 2019-07-18 MED ORDER — SODIUM CHLORIDE 0.9 % IV SOLN
1.0000 g | INTRAVENOUS | Status: DC
  Administered 2019-07-19: 1 g via INTRAVENOUS
  Filled 2019-07-18: qty 10

## 2019-07-18 MED ORDER — GUAIFENESIN ER 600 MG PO TB12
600.0000 mg | ORAL_TABLET | Freq: Two times a day (BID) | ORAL | Status: DC
  Administered 2019-07-18 – 2019-07-24 (×12): 600 mg via ORAL
  Filled 2019-07-18 (×12): qty 1

## 2019-07-18 MED ORDER — SODIUM CHLORIDE 0.9 % IV SOLN
2.0000 g | Freq: Once | INTRAVENOUS | Status: AC
  Administered 2019-07-18: 2 g via INTRAVENOUS
  Filled 2019-07-18: qty 20

## 2019-07-18 NOTE — Progress Notes (Signed)
Notified provider of need to order fluid bolus.  ?
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
## 2019-07-18 NOTE — ED Triage Notes (Signed)
Pt lives at home but is coming from her doctors office today with ems for c.o generalized body aches/not feeling well for the past few days. Pt febrile at 103.2 orally. Pt a.o, denies pain anywhere. VSS

## 2019-07-18 NOTE — ED Provider Notes (Signed)
[facility] Emergency Department Provider Note MRN:  [id number]  Arrival date & time: 07/18/19     Chief Complaint   Fever and Generalized Body Aches   History of Present Illness   [name] is a 85 y.o. year-old female with a history of CAD, sarcoidosis presenting to the ED with chief complaint of fever.  Feeling generally unwell for 2 days.  Generally weak, low energy, body aches.  Endorsing nausea but no vomiting, no diarrhea.  No headache or vision change, no cough, no chest pain or shortness of breath, no abdominal pain, no dysuria, no rash.  Symptoms constant, no exacerbating or alleviating factors.  Mild to moderate in severity.  Review of Systems  A complete 10 system review of systems was obtained and all systems are negative except as noted in the HPI and PMH.   Patient's Health History    Past Medical History:  Diagnosis Date  . Allergy    SEASONAL  . Arthritis   . Asthma    "slight"   . Baker's cyst of knee, right   . CAD (coronary artery disease)    a. Myoview 10/15 - normal EF 70% // Myoview 11/16: EF 75%, normal perfusion, (there was no blood pressure drop at low level exercise with Lexiscan infusion), low risk study // c. LHC 3/17 - LAD irregs, oLCx 70 (neg FFR), mRCA 30, EF 55-65% >> med Rx // Myoview 07/2018:  EF 73, extracardiac uptake, no significant ischemia (reviewed with Dr. Burt Knack), Low Risk   . Carotid stenosis    a. Carotid US 9/15 - Bilateral 1-39% ICA >> FU 2 years // b. Bilateral ICA 1-39 >> FU prn // Carotid US 06/2018: bilat 1-39; fu prn  . Dyspnea    due to Sarcoidosis  . Elbow fracture 04/2017   Right, had surgery  . Esophageal reflux   . Hematochezia   . History of echocardiogram    a. Echo 10/15 - EF 60-65%, no RWMA  . HLD (hyperlipidemia)   . Hx of colonoscopy   . Osteoporosis   . Other chronic pulmonary heart diseases   . Paroxysmal supraventricular tachycardia ([place])   . Pneumonia   . Sarcoidosis      Past Surgical History:  Procedure Laterality Date  . arm surgery Right   . BLADDER SURGERY    . CARDIAC CATHETERIZATION N/A 05/20/2015   Procedure: Left Heart Cath and Coronary Angiography;  Surgeon: Sherren Mocha, MD;  Location: [facility];  Service: Cardiovascular;  Laterality: N/A;  . CARPAL TUNNEL RELEASE Left   . COLONOSCOPY    . FOOT SURGERY Right   . HIP SURGERY Right 2011   full replacement  . LEG SURGERY Left May 2014   femur fracture s/p open and closed reduction in [place], Dr. Jimmye Norman  . ORIF ELBOW FRACTURE Right 05/09/2017   Procedure: ORIF right olecranon fracture with repair/reconstruction, ulnar nerve transposition as needed;  Surgeon: Roseanne Kaufman, MD;  Location: [facility];  Service: Orthopedics;  Laterality: Right;  Requests 90 mins  . pelvis fracture    . POLYPECTOMY    . REVERSE SHOULDER ARTHROPLASTY Left 06/13/2019   Procedure: REVERSE SHOULDER ARTHROPLASTY;  Surgeon: Justice Britain, MD;  Location: [facility];  Service: Orthopedics;  Laterality: Left;  184min  . SHOULDER ARTHROSCOPY W/ ROTATOR CUFF REPAIR Right   . TRAPEZIUM RESECTION Right     Family History  Problem Relation Age of Onset  . Colon cancer Mother 55  . Cancer  Mother   . Lung cancer Father   . Heart disease Father   . Arrhythmia Father   . Cancer Father   . Cancer Sister        ovarian  . Heart attack Brother   . Esophageal cancer Neg Hx   . Rectal cancer Neg Hx   . Stomach cancer Neg Hx   . Stroke Neg Hx     Social History   Socioeconomic History  . Marital status: Widowed    Spouse name: Not on file  . Number of children: 1  . Years of education: Not on file  . Highest education level: Some college, no degree  Occupational History  . Occupation: retired    Comment: still works as Scientist, 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  Tobacco Use  . Smoking status: Never Smoker  . Smokeless tobacco: Never Used  Substance and Sexual Activity  . Alcohol use: No  . Drug use: No  . Sexual  activity: Not Currently    Partners: Male    Birth control/protection: Post-menopausal  Other Topics Concern  . Not on file  Social History Narrative   Lives with daughter and son in law   Caffeine use:  Coffee 1 per day   Right handed   Social Determinants of Health   Financial Resource Strain:   . Difficulty of Paying Living Expenses:   Food Insecurity:   . Worried About Charity fundraiser in the Last Year:   . Arboriculturist in the Last Year:   Transportation Needs:   . Film/video editor (Medical):   Marland Kitchen Lack of Transportation (Non-Medical):   Physical Activity:   . Days of Exercise per Week:   . Minutes of Exercise per Session:   Stress:   . Feeling of Stress :   Social Connections:   . Frequency of Communication with Friends and Family:   . Frequency of Social Gatherings with Friends and Family:   . Attends Religious Services:   . Active Member of Clubs or Organizations:   . Attends Archivist Meetings:   Marland Kitchen Marital Status:   Intimate Partner Violence:   . Fear of Current or Ex-Partner:   . Emotionally Abused:   Marland Kitchen Physically Abused:   . Sexually Abused:      Physical Exam   Vitals:   07/18/19 1900 07/18/19 1945  BP: (!) 96/50 (!) 133/56  Pulse: 67 77  Resp: (!) 22 20  Temp:  98.4 F (36.9 C)  SpO2: 91% 96%    CONSTITUTIONAL: Well-appearing, NAD NEURO:  Alert and oriented x 3, no focal deficits EYES:  eyes equal and reactive ENT/NECK:  no LAD, no JVD CARDIO: Regular rate, well-perfused, normal S1 and S2 PULM:  CTAB no wheezing or rhonchi GI/GU:  normal bowel sounds, non-distended, non-tender MSK/SPINE:  No gross deformities, no edema SKIN:  no rash, atraumatic PSYCH:  Appropriate speech and behavior  *Additional and/or pertinent findings included in MDM below  Diagnostic and Interventional Summary    EKG Interpretation  Date/Time:  Thursday Jul 18 2019 17:24:41 EDT Ventricular Rate:  74 PR Interval:    QRS Duration: 100 QT  Interval:  385 QTC Calculation: 428 R Axis:   -14 Text Interpretation: Sinus rhythm Probable left ventricular hypertrophy Anterior Q waves, possibly due to LVH Baseline wander in lead(s) V2 Confirmed by Gerlene Fee [phone]) on 07/18/2019 5:59:27 PM      Labs Reviewed  URINALYSIS, ROUTINE W REFLEX MICROSCOPIC -  Abnormal; Notable for the following components:      Result Value   APPearance HAZY (*)    Hgb urine dipstick MODERATE (*)    Ketones, ur 5 (*)    Protein, ur 100 (*)    Nitrite POSITIVE (*)    Leukocytes,Ua LARGE (*)    Bacteria, UA MANY (*)    All other components within normal limits  CBC - Abnormal; Notable for the following components:   WBC 20.7 (*)    RBC 3.67 (*)    Hemoglobin 11.5 (*)    HCT 35.8 (*)    All other components within normal limits  COMPREHENSIVE METABOLIC PANEL - Abnormal; Notable for the following components:   Sodium 134 (*)    BUN 26 (*)    Calcium 8.7 (*)    Total Protein 5.5 (*)    Albumin 3.0 (*)    All other components within normal limits  SARS CORONAVIRUS 2 BY RT PCR (HOSPITAL ORDER, [facility])  CULTURE, BLOOD (ROUTINE X 2)  CULTURE, BLOOD (ROUTINE X 2)  URINE CULTURE  LACTIC ACID, PLASMA  BASIC METABOLIC PANEL  CBC    CT Head Wo Contrast  Final Result    DG Chest Port 1 View  Final Result      Medications  enoxaparin (LOVENOX) injection 40 mg (40 mg Subcutaneous Given 07/18/19 2127)  sodium chloride flush (NS) 0.9 % injection 3 mL (3 mLs Intravenous Given 07/18/19 2128)  lactated ringers infusion ( Intravenous New Bag/Given 07/18/19 2042)  cefTRIAXone (ROCEPHIN) 1 g in sodium chloride 0.9 % 100 mL IVPB (has no administration in time range)  acetaminophen (TYLENOL) tablet 650 mg (has no administration in time range)    Or  acetaminophen (TYLENOL) suppository 650 mg (has no administration in time range)  ondansetron (ZOFRAN) tablet 4 mg (has no administration in time range)    Or  ondansetron  (ZOFRAN) injection 4 mg (has no administration in time range)  guaiFENesin (MUCINEX) 12 hr tablet 600 mg (600 mg Oral Given 07/18/19 2127)  albuterol (PROVENTIL) (2.5 MG/3ML) 0.083% nebulizer solution 3 mL (3 mLs Inhalation Given 07/18/19 2100)  aspirin EC tablet 81 mg (81 mg Oral Given 07/18/19 2127)  pantoprazole (PROTONIX) EC tablet 40 mg (40 mg Oral Given 07/18/19 2127)  rosuvastatin (CRESTOR) tablet 10 mg (has no administration in time range)  sodium chloride 0.9 % bolus 1,000 mL (0 mLs Intravenous Stopped 07/18/19 1730)  acetaminophen (TYLENOL) tablet 1,000 mg (1,000 mg Oral Given 07/18/19 1608)  ondansetron (ZOFRAN) injection 4 mg (4 mg Intravenous Given 07/18/19 1608)  cefTRIAXone (ROCEPHIN) 2 g in sodium chloride 0.9 % 100 mL IVPB (0 g Intravenous Stopped 07/18/19 1746)  sodium chloride 0.9 % bolus 1,000 mL (1,000 mLs Intravenous New Bag/Given 07/18/19 1915)     Procedures  /  Critical Care Procedures  ED Course and Medical Decision Making  I have reviewed the triage vital signs, the nursing notes, and pertinent available records from the EMR.  Listed above are laboratory and imaging tests that I personally ordered, reviewed, and interpreted and then considered in my medical decision making (see below for details).      Patient presents febrile but well-appearing with otherwise normal vital signs, very nontoxic on exam.  With no obvious source of infection, will begin infectious work-up and monitor closely.  Patient's initial labs revealed significant leukocytosis, triggering a formal code sepsis alert.  Given IV ceftriaxone.  2 L IV fluids.  Work-up largely  reassuring, UA is consistent with infection.  Admitted to hospital service for further management of urinary origin sepsis.  Barth Kirks. Sedonia Small, [place] [email or web address]  Final Clinical Impressions(s) / ED Diagnoses     ICD-10-CM   1. Acute cystitis without hematuria  N30.00    2. Sepsis, due to unspecified organism, unspecified whether acute organ dysfunction present [facility])  A41.9     ED Discharge Orders    None       Discharge Instructions Discussed with and Provided to Patient:   Discharge Instructions   None       Maudie Flakes, MD 07/18/19 2355

## 2019-07-19 ENCOUNTER — Inpatient Hospital Stay (HOSPITAL_COMMUNITY): Payer: Medicare Other

## 2019-07-19 DIAGNOSIS — D509 Iron deficiency anemia, unspecified: Secondary | ICD-10-CM | POA: Diagnosis present

## 2019-07-19 DIAGNOSIS — I110 Hypertensive heart disease with heart failure: Secondary | ICD-10-CM | POA: Diagnosis present

## 2019-07-19 DIAGNOSIS — Z79899 Other long term (current) drug therapy: Secondary | ICD-10-CM | POA: Diagnosis not present

## 2019-07-19 DIAGNOSIS — Z8249 Family history of ischemic heart disease and other diseases of the circulatory system: Secondary | ICD-10-CM | POA: Diagnosis not present

## 2019-07-19 DIAGNOSIS — G9341 Metabolic encephalopathy: Secondary | ICD-10-CM

## 2019-07-19 DIAGNOSIS — Z79891 Long term (current) use of opiate analgesic: Secondary | ICD-10-CM | POA: Diagnosis not present

## 2019-07-19 DIAGNOSIS — R0600 Dyspnea, unspecified: Secondary | ICD-10-CM | POA: Diagnosis not present

## 2019-07-19 DIAGNOSIS — Z20822 Contact with and (suspected) exposure to covid-19: Secondary | ICD-10-CM | POA: Diagnosis present

## 2019-07-19 DIAGNOSIS — Z96612 Presence of left artificial shoulder joint: Secondary | ICD-10-CM

## 2019-07-19 DIAGNOSIS — I272 Pulmonary hypertension, unspecified: Secondary | ICD-10-CM | POA: Diagnosis present

## 2019-07-19 DIAGNOSIS — N390 Urinary tract infection, site not specified: Secondary | ICD-10-CM | POA: Diagnosis present

## 2019-07-19 DIAGNOSIS — A4151 Sepsis due to Escherichia coli [E. coli]: Secondary | ICD-10-CM | POA: Diagnosis present

## 2019-07-19 DIAGNOSIS — Z882 Allergy status to sulfonamides status: Secondary | ICD-10-CM | POA: Diagnosis not present

## 2019-07-19 DIAGNOSIS — Z801 Family history of malignant neoplasm of trachea, bronchus and lung: Secondary | ICD-10-CM | POA: Diagnosis not present

## 2019-07-19 DIAGNOSIS — J479 Bronchiectasis, uncomplicated: Secondary | ICD-10-CM

## 2019-07-19 DIAGNOSIS — I5033 Acute on chronic diastolic (congestive) heart failure: Secondary | ICD-10-CM | POA: Diagnosis present

## 2019-07-19 DIAGNOSIS — R5381 Other malaise: Secondary | ICD-10-CM | POA: Diagnosis present

## 2019-07-19 DIAGNOSIS — R630 Anorexia: Secondary | ICD-10-CM

## 2019-07-19 DIAGNOSIS — A419 Sepsis, unspecified organism: Secondary | ICD-10-CM | POA: Diagnosis not present

## 2019-07-19 DIAGNOSIS — Z800 Family history of malignant neoplasm of digestive organs: Secondary | ICD-10-CM | POA: Diagnosis not present

## 2019-07-19 DIAGNOSIS — D72825 Bandemia: Secondary | ICD-10-CM

## 2019-07-19 DIAGNOSIS — D869 Sarcoidosis, unspecified: Secondary | ICD-10-CM | POA: Diagnosis present

## 2019-07-19 DIAGNOSIS — E785 Hyperlipidemia, unspecified: Secondary | ICD-10-CM | POA: Diagnosis present

## 2019-07-19 DIAGNOSIS — I471 Supraventricular tachycardia: Secondary | ICD-10-CM

## 2019-07-19 DIAGNOSIS — I2510 Atherosclerotic heart disease of native coronary artery without angina pectoris: Secondary | ICD-10-CM | POA: Diagnosis present

## 2019-07-19 DIAGNOSIS — Z7982 Long term (current) use of aspirin: Secondary | ICD-10-CM | POA: Diagnosis not present

## 2019-07-19 DIAGNOSIS — I248 Other forms of acute ischemic heart disease: Secondary | ICD-10-CM | POA: Diagnosis present

## 2019-07-19 LAB — FERRITIN: Ferritin: 93 ng/mL (ref 11–307)

## 2019-07-19 LAB — ECHOCARDIOGRAM LIMITED
Height: 63 in
Weight: 2003.2 oz

## 2019-07-19 LAB — BASIC METABOLIC PANEL
Anion gap: 9 (ref 5–15)
BUN: 18 mg/dL (ref 8–23)
CO2: 22 mmol/L (ref 22–32)
Calcium: 8.4 mg/dL — ABNORMAL LOW (ref 8.9–10.3)
Chloride: 106 mmol/L (ref 98–111)
Creatinine, Ser: 0.74 mg/dL (ref 0.44–1.00)
GFR calc Af Amer: >60 mL/min (ref >60)
GFR calc non Af Amer: >60 mL/min (ref >60)
Glucose, Bld: 85 mg/dL (ref 70–99)
Potassium: 3.5 mmol/L (ref 3.5–5.1)
Sodium: 137 mmol/L (ref 135–145)

## 2019-07-19 LAB — RETICULOCYTES
Immature Retic Fract: 14.8 % (ref 2.3–15.9)
RBC.: 3.45 MIL/uL — ABNORMAL LOW (ref 3.87–5.11)
Retic Count, Absolute: 65.9 10*3/uL (ref 19.0–186.0)
Retic Ct Pct: 1.9 % (ref 0.4–3.1)

## 2019-07-19 LAB — IRON AND TIBC
Iron: 6 ug/dL — ABNORMAL LOW (ref 28–170)
Saturation Ratios: 3 % — ABNORMAL LOW (ref 10.4–31.8)
TIBC: 217 ug/dL — ABNORMAL LOW (ref 250–450)
UIBC: 211 ug/dL

## 2019-07-19 LAB — CBC
HCT: 35.3 % — ABNORMAL LOW (ref 36.0–46.0)
Hemoglobin: 11 g/dL — ABNORMAL LOW (ref 12.0–15.0)
MCH: 30.5 pg (ref 26.0–34.0)
MCHC: 31.2 g/dL (ref 30.0–36.0)
MCV: 97.8 fL (ref 80.0–100.0)
Platelets: 142 10*3/uL — ABNORMAL LOW (ref 150–400)
RBC: 3.61 MIL/uL — ABNORMAL LOW (ref 3.87–5.11)
RDW: 13.9 % (ref 11.5–15.5)
WBC: 14.8 10*3/uL — ABNORMAL HIGH (ref 4.0–10.5)
nRBC: 0 % (ref 0.0–0.2)

## 2019-07-19 LAB — VITAMIN B12: Vitamin B-12: 422 pg/mL (ref 180–914)

## 2019-07-19 LAB — FOLATE: Folate: 18.9 ng/mL (ref >5.9)

## 2019-07-19 MED ORDER — TAB-A-VITE/IRON PO TABS
1.0000 | ORAL_TABLET | Freq: Every day | ORAL | Status: DC
  Administered 2019-07-19 – 2019-07-24 (×6): 1 via ORAL
  Filled 2019-07-19 (×6): qty 1

## 2019-07-19 MED ORDER — IPRATROPIUM-ALBUTEROL 0.5-2.5 (3) MG/3ML IN SOLN
3.0000 mL | Freq: Four times a day (QID) | RESPIRATORY_TRACT | Status: DC | PRN
  Administered 2019-07-20: 3 mL via RESPIRATORY_TRACT
  Filled 2019-07-19: qty 3

## 2019-07-19 MED ORDER — ENSURE ENLIVE PO LIQD
237.0000 mL | Freq: Three times a day (TID) | ORAL | Status: DC
  Administered 2019-07-19 – 2019-07-23 (×14): 237 mL via ORAL

## 2019-07-19 MED ORDER — ACETAMINOPHEN 500 MG PO TABS
1000.0000 mg | ORAL_TABLET | Freq: Three times a day (TID) | ORAL | Status: DC
  Administered 2019-07-19 – 2019-07-24 (×14): 1000 mg via ORAL
  Filled 2019-07-19 (×14): qty 2

## 2019-07-19 MED ORDER — SACCHAROMYCES BOULARDII 250 MG PO CAPS
250.0000 mg | ORAL_CAPSULE | Freq: Two times a day (BID) | ORAL | Status: DC
  Administered 2019-07-19 – 2019-07-24 (×10): 250 mg via ORAL
  Filled 2019-07-19 (×10): qty 1

## 2019-07-19 MED ORDER — BUPROPION HCL 75 MG PO TABS
75.0000 mg | ORAL_TABLET | Freq: Every morning | ORAL | Status: DC
  Administered 2019-07-19 – 2019-07-24 (×6): 75 mg via ORAL
  Filled 2019-07-19 (×6): qty 1

## 2019-07-19 MED ORDER — DILTIAZEM HCL ER COATED BEADS 180 MG PO CP24
180.0000 mg | ORAL_CAPSULE | Freq: Every day | ORAL | Status: DC
  Administered 2019-07-19 – 2019-07-24 (×6): 180 mg via ORAL
  Filled 2019-07-19 (×6): qty 1

## 2019-07-19 MED ORDER — FLUTICASONE PROPIONATE 50 MCG/ACT NA SUSP
2.0000 | Freq: Every day | NASAL | Status: DC
  Administered 2019-07-20 – 2019-07-23 (×4): 2 via NASAL
  Filled 2019-07-19: qty 16

## 2019-07-19 MED ORDER — POLYETHYLENE GLYCOL 3350 17 G PO PACK
17.0000 g | PACK | Freq: Every day | ORAL | Status: DC
  Administered 2019-07-23: 17 g via ORAL
  Filled 2019-07-19 (×5): qty 1

## 2019-07-19 MED ORDER — LOPERAMIDE HCL 2 MG PO CAPS
2.0000 mg | ORAL_CAPSULE | ORAL | Status: DC | PRN
  Administered 2019-07-19: 2 mg via ORAL
  Filled 2019-07-19: qty 1

## 2019-07-19 NOTE — Evaluation (Signed)
Physical Therapy Evaluation Patient Details Name: [name] MRN: [id number] DOB: 1935-02-14 Today's Date: 07/19/2019   History of Present Illness  [name] is a 85 y.o. female with medical history significant for CAD, paroxysmal supraventricular tachycardia, bronchiectasis/sarcoidosis, hyperlipidemia, and status post left shoulder reverse arthroplasty on 06/13/2019 who presents to the ED for evaluation of fever and generally feeling unwell.   Work up includes sepsis due to UTI.  Clinical Impression  Pt admitted with/for fever, weakness and not feeling well due to sepsis from  UTI.  Pt needing min guard to minimal assist presently, but should improve quickly.  Pt currently limited functionally due to the problems listed below.  (see problems list.)  Pt will benefit from PT to maximize function and safety to be able to get home safely with available assist.     Follow Up Recommendations No PT follow up    Equipment Recommendations  None recommended by PT    Recommendations for Other Services       Precautions / Restrictions Precautions Precautions: Fall      Mobility  Bed Mobility Overal bed mobility: Needs Assistance Bed Mobility: Supine to Sit;Sit to Supine     Supine to sit: Supervision Sit to supine: Modified independent (Device/Increase time)      Transfers Overall transfer level: Needs assistance   Transfers: Sit to/from Stand Sit to Stand: Min guard            Ambulation/Gait Ambulation/Gait assistance: Min assist Gait Distance (Feet): 130 Feet Assistive device: 1 person hand held assist Gait Pattern/deviations: Step-through pattern Gait velocity: slower Gait velocity interpretation: <1.8 ft/sec, indicate of risk for recurrent falls General Gait Details: generally steady, but 2/4 dysnea and incr. WOB  sats sayed at 94% and EHR 80 bpm  Stairs            Wheelchair Mobility    Modified Rankin (Stroke Patients Only)       Balance  Overall balance assessment: No apparent balance deficits (not formally assessed)                                           Pertinent Vitals/Pain Pain Assessment: No/denies pain(except upset stomach)    Home Living Family/patient expects to be discharged to:: Private residence Living Arrangements: Children Available Help at Discharge: Family;Available 24 hours/day Type of Home: House Home Access: Stairs to enter   [employer] of Steps: 1 Home Layout: One level   Additional Comments: lives with daughter and son in [place]. daughter works from home.    Prior Function Level of Independence: Independent               Hand Dominance   Dominant Hand: Right    Extremity/Trunk Assessment   Upper Extremity Assessment Upper Extremity Assessment: Overall WFL for tasks assessed    Lower Extremity Assessment Lower Extremity Assessment: Overall WFL for tasks assessed(mild weakness)       Communication   Communication: No difficulties  Cognition Arousal/Alertness: Awake/alert Behavior During Therapy: WFL for tasks assessed/performed Overall Cognitive Status: Within Functional Limits for tasks assessed                                        General Comments      Exercises  Assessment/Plan    PT Assessment Patient needs continued PT services  PT Problem List Decreased strength;Decreased activity tolerance;Decreased mobility;Decreased knowledge of use of DME;Cardiopulmonary status limiting activity       PT Treatment Interventions DME instruction;Gait training;Functional mobility training;Therapeutic activities;Patient/family education;Balance training    PT Goals (Current goals can be found in the Care Plan section)  Acute Rehab PT Goals Patient Stated Goal: I want to go home, but I don't think I need any therapy. Time For Goal Achievement: 07/26/19 Potential to Achieve Goals: Good    Frequency Min 3X/week   Barriers  to discharge        Co-evaluation               AM-PAC PT "6 Clicks" Mobility  Outcome Measure Help needed turning from your back to your side while in a flat bed without using bedrails?: A Little Help needed moving from lying on your back to sitting on the side of a flat bed without using bedrails?: A Little Help needed moving to and from a bed to a chair (including a wheelchair)?: A Little Help needed standing up from a chair using your arms (e.g., wheelchair or bedside chair)?: A Little Help needed to walk in hospital room?: A Little Help needed climbing 3-5 steps with a railing? : A Little 6 Click Score: 18    End of Session   Activity Tolerance: Patient tolerated treatment well;Patient limited by fatigue Patient left: in bed;with call bell/phone within reach Nurse Communication: Mobility status PT Visit Diagnosis: Other abnormalities of gait and mobility (R26.89);Difficulty in walking, not elsewhere classified (R26.2)    Time: [id number] PT Time Calculation (min) (ACUTE ONLY): 19 min   Charges:   PT Evaluation $PT Eval Moderate Complexity: 1 Mod          07/19/2019  Sherri Carne., PT Acute Rehabilitation Services [phone]  (pager) [phone]  (office)  [name] 07/19/2019, 1:42 PM

## 2019-07-19 NOTE — Progress Notes (Signed)
Initial Nutrition Assessment  DOCUMENTATION CODES:   Not applicable  INTERVENTION:  Ensure Enlive po TID, each supplement provides 350 kcal and 20 grams of protein  Magic cup TID with meals, each supplement provides 290 kcal and 9 grams of protein  Continue MVI daily  NUTRITION DIAGNOSIS:   Inadequate oral intake related to poor appetite as evidenced by per patient/family report.    GOAL:   Patient will meet greater than or equal to 90% of their needs    MONITOR:   PO intake, Supplement acceptance, Weight trends, Labs, I & O's  REASON FOR ASSESSMENT:   Consult Poor PO  ASSESSMENT:   Pt with a PMH significant for CAD, PSVT, bronchiectasis, sarcoidosis, HLD, recent L shoulder arthroplasty admitted with sepsis 2/2 UTI and acute metabolic encephalopathy.  Pt has been feeling unwell for ~ 1 week and c/o fatigue and generalized weakness. She has also been experiencing nausea and poor appetite and has had limited po intake for the last few days. Pt states that typically, her diet is quite varied and consists of 3 balanced meals per day with snacks and a Premier protein shake between meals. Pt agreeable to use of oral nutrition supplements while admitted.   Pt denies wt loss PTA. Per wt readings, pt without significant wt loss over last 6 months.   PO Intake: 30-100% x 2 recorded meals  UOP: 666ml x24 hours I/O: +1,423.75ml since admit  Labs reviewed. Medications reviewed and include: MVI, Miralax   Diet Order:   Diet Order            Diet Heart Room service appropriate? Yes; Fluid consistency: Thin  Diet effective now              EDUCATION NEEDS:   No education needs have been identified at this time  Skin:  Skin Assessment: Reviewed RN Assessment  Last BM:  5/27  Height:   Ht Readings from Last 1 Encounters:  07/18/19 5\' 3"  (1.6 m)    Weight:   Wt Readings from Last 8 Encounters:  07/19/19 56.8 kg  06/13/19 56.3 kg  06/11/19 56.2 kg  06/05/19  52.7 kg  06/05/19 54 kg  05/31/19 54.3 kg  03/25/19 54.4 kg  01/07/19 57.6 kg    BMI:  Body mass index is 22.18 kg/m.  Estimated Nutritional Needs:   Kcal:  1400-1600  Protein:  70-85 grams  Fluid:  >/= 1.4 L/d   Larkin Ina, MS, RD, LDN RD pager number and weekend/on-call pager number located in [place].

## 2019-07-19 NOTE — Progress Notes (Signed)
   07/19/19 0016  Assess: MEWS Score  Temp (!) 102.7 F (39.3 C)  BP (!) 126/48  Pulse Rate 87  Resp 15  Level of Consciousness Alert  SpO2 94 %  O2 Device Room Air  Assess: MEWS Score  MEWS Temp 2  MEWS Systolic 0  MEWS Pulse 0  MEWS RR 0  MEWS LOC 0  MEWS Score 2  MEWS Score Color Yellow  Assess: if the MEWS score is Yellow or Red  Were vital signs taken at a resting state? Yes  Focused Assessment Documented focused assessment  Early Detection of Sepsis Score *See Row Information* Medium  MEWS guidelines implemented *See Row Information* Yes  Treat  MEWS Interventions Administered prn meds/treatments;Escalated (See documentation below)  Take Vital Signs  Increase Vital Sign Frequency  Yellow: Q 2hr X 2 then Q 4hr X 2, if remains yellow, continue Q 4hrs  Escalate  MEWS: Escalate Yellow: discuss with charge nurse/RN and consider discussing with provider and RRT  Notify: Charge Nurse/RN  Name of Charge Nurse/RN Notified Danae Chen   Date Charge Nurse/RN Notified 07/19/19  Time Charge Nurse/RN Notified 0025  Notify: Provider  Provider Name/Title Argie Ramming  Date Provider Notified 07/19/19  Time Provider Notified 0025  Notification Type Page  Notification Reason Change in status  Response No new orders  Date of Provider Response 07/19/19  Document  Patient Outcome Other (Comment)  Progress note created (see row info) Yes
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
## 2019-07-19 NOTE — Progress Notes (Signed)
  Echocardiogram 2D Echocardiogram has been performed.  [name] 07/19/2019, 2:28 PM

## 2019-07-20 ENCOUNTER — Inpatient Hospital Stay (HOSPITAL_COMMUNITY): Payer: Medicare Other

## 2019-07-20 DIAGNOSIS — R7881 Bacteremia: Secondary | ICD-10-CM

## 2019-07-20 DIAGNOSIS — I272 Pulmonary hypertension, unspecified: Secondary | ICD-10-CM

## 2019-07-20 DIAGNOSIS — I5032 Chronic diastolic (congestive) heart failure: Secondary | ICD-10-CM

## 2019-07-20 DIAGNOSIS — B9620 Unspecified Escherichia coli [E. coli] as the cause of diseases classified elsewhere: Secondary | ICD-10-CM

## 2019-07-20 LAB — BLOOD CULTURE ID PANEL (REFLEXED)

## 2019-07-20 LAB — CBC WITH DIFFERENTIAL/PLATELET
Abs Immature Granulocytes: 0.08 10*3/uL — ABNORMAL HIGH (ref 0.00–0.07)
Basophils Absolute: 0 10*3/uL (ref 0.0–0.1)
Basophils Relative: 0 %
Eosinophils Absolute: 0.1 10*3/uL (ref 0.0–0.5)
Eosinophils Relative: 1 %
HCT: 34.2 % — ABNORMAL LOW (ref 36.0–46.0)
Hemoglobin: 10.7 g/dL — ABNORMAL LOW (ref 12.0–15.0)
Immature Granulocytes: 1 %
Lymphocytes Relative: 7 %
Lymphs Abs: 0.6 10*3/uL — ABNORMAL LOW (ref 0.7–4.0)
MCH: 30.7 pg (ref 26.0–34.0)
MCHC: 31.3 g/dL (ref 30.0–36.0)
MCV: 98 fL (ref 80.0–100.0)
Monocytes Absolute: 0.7 10*3/uL (ref 0.1–1.0)
Monocytes Relative: 8 %
Neutro Abs: 7 10*3/uL (ref 1.7–7.7)
Neutrophils Relative %: 83 %
Platelets: 154 10*3/uL (ref 150–400)
RBC: 3.49 MIL/uL — ABNORMAL LOW (ref 3.87–5.11)
RDW: 14 % (ref 11.5–15.5)
WBC: 8.5 10*3/uL (ref 4.0–10.5)
nRBC: 0 % (ref 0.0–0.2)

## 2019-07-20 LAB — TROPONIN I (HIGH SENSITIVITY)
Troponin I (High Sensitivity): 33 ng/L — ABNORMAL HIGH (ref <18)
Troponin I (High Sensitivity): 47 ng/L — ABNORMAL HIGH (ref <18)

## 2019-07-20 LAB — BRAIN NATRIURETIC PEPTIDE: B Natriuretic Peptide: 273.6 pg/mL — ABNORMAL HIGH (ref 0.0–100.0)

## 2019-07-20 MED ORDER — SODIUM CHLORIDE 0.9 % IV SOLN
510.0000 mg | Freq: Once | INTRAVENOUS | Status: AC
  Administered 2019-07-20: 510 mg via INTRAVENOUS
  Filled 2019-07-20: qty 17

## 2019-07-20 MED ORDER — FUROSEMIDE 10 MG/ML IJ SOLN
40.0000 mg | Freq: Once | INTRAMUSCULAR | Status: AC
  Administered 2019-07-20: 40 mg via INTRAVENOUS
  Filled 2019-07-20: qty 4

## 2019-07-20 MED ORDER — SODIUM CHLORIDE 0.9 % IV SOLN
2.0000 g | INTRAVENOUS | Status: DC
  Administered 2019-07-20 – 2019-07-24 (×5): 2 g via INTRAVENOUS
  Filled 2019-07-20: qty 20
  Filled 2019-07-20: qty 2
  Filled 2019-07-20: qty 20
  Filled 2019-07-20: qty 2
  Filled 2019-07-20: qty 20
  Filled 2019-07-20: qty 2

## 2019-07-20 MED ORDER — MELATONIN 5 MG PO TABS
5.0000 mg | ORAL_TABLET | Freq: Every day | ORAL | Status: DC
  Administered 2019-07-20 – 2019-07-23 (×4): 5 mg via ORAL
  Filled 2019-07-20 (×4): qty 1

## 2019-07-20 NOTE — Progress Notes (Signed)
Report received from [employer]. I agree with her charted shift assessment.

## 2019-07-20 NOTE — Progress Notes (Signed)
PHARMACY - PHYSICIAN COMMUNICATION CRITICAL VALUE ALERT - BLOOD CULTURE IDENTIFICATION (BCID)  [name] is an 85 y.o. female who presented to [facility] on 07/18/2019 with a chief complaint of fever, body aches  Assessment:  PT with Ecoli (KPC negative) in 1/4 blood culture bottles (suspected source: urine)  Name of physician (or Provider) ContactedKennon Holter, NP  Current antibiotics: Ceftriaxone 1gm IV q24h  Changes to prescribed antibiotics recommended:  Change Ceftriaxone to 2gm IV q24h  Results for orders placed or performed during the hospital encounter of 07/18/19  Blood Culture ID Panel (Reflexed) (Collected: 07/18/2019  3:45 PM)  Result Value Ref Range   Enterococcus species NOT DETECTED NOT DETECTED   Listeria monocytogenes NOT DETECTED NOT DETECTED   Staphylococcus species NOT DETECTED NOT DETECTED   Staphylococcus aureus (BCID) NOT DETECTED NOT DETECTED   Streptococcus species NOT DETECTED NOT DETECTED   Streptococcus agalactiae NOT DETECTED NOT DETECTED   Streptococcus pneumoniae NOT DETECTED NOT DETECTED   Streptococcus pyogenes NOT DETECTED NOT DETECTED   Acinetobacter baumannii NOT DETECTED NOT DETECTED   Enterobacteriaceae species DETECTED (A) NOT DETECTED   Enterobacter cloacae complex NOT DETECTED NOT DETECTED   Escherichia coli DETECTED (A) NOT DETECTED   Klebsiella oxytoca NOT DETECTED NOT DETECTED   Klebsiella pneumoniae NOT DETECTED NOT DETECTED   Proteus species NOT DETECTED NOT DETECTED   Serratia marcescens NOT DETECTED NOT DETECTED   Carbapenem resistance NOT DETECTED NOT DETECTED   Haemophilus influenzae NOT DETECTED NOT DETECTED   Neisseria meningitidis NOT DETECTED NOT DETECTED   Pseudomonas aeruginosa NOT DETECTED NOT DETECTED   Candida albicans NOT DETECTED NOT DETECTED   Candida glabrata NOT DETECTED NOT DETECTED   Candida krusei NOT DETECTED NOT DETECTED   Candida parapsilosis NOT DETECTED NOT DETECTED   Candida tropicalis NOT DETECTED  NOT DETECTED    Sherlon Handing, PharmD, BCPS Please see amion for complete clinical pharmacist phone list 07/20/2019  6:30 AM

## 2019-07-20 NOTE — Plan of Care (Signed)
?  Problem: Education: ?Goal: Knowledge of General Education information will improve ?Description: Including pain rating scale, medication(s)/side effects and non-pharmacologic comfort measures ?Outcome: Progressing ?  ?Problem: Health Behavior/Discharge Planning: ?Goal: Ability to manage health-related needs will improve ?Outcome: Progressing ?  ?Problem: Clinical Measurements: ?Goal: Will remain free from infection ?Outcome: Progressing ?Goal: Cardiovascular complication will be avoided ?Outcome: Progressing ?  ?Problem: Activity: ?Goal: Risk for activity intolerance will decrease ?Outcome: Progressing ?  ?

## 2019-07-20 NOTE — Evaluation (Signed)
Occupational Therapy Evaluation Patient Details Name: [name] MRN: [id number] DOB: 1934/05/01 Today's Date: 07/20/2019    History of Present Illness [name] is a 85 y.o. female with medical history significant for CAD, paroxysmal supraventricular tachycardia, bronchiectasis/sarcoidosis, hyperlipidemia, and status post left shoulder reverse arthroplasty on 06/13/2019 who presents to the ED for evaluation of fever and generally feeling unwell.   Work up includes sepsis due to UTI.   Clinical Impression   PTA patient reports independent with ADls, mobility and limited IADLs.  Patient currently requires min guard for transfers and in room mobility, min assist to supervision for ADLs.  She had recent L shoulder surgery and is limited by decreased functional use of L UE, impaired balance, and generalized weakness. She will have 24/7 support at discharge from her daughter who can assist as needed.  Based on performance today, patient will benefit from further OT services acutely but anticipate she will progress well with no further needs after dc home.  Recommend she continues OP services for her L shoulder rehab.     Follow Up Recommendations  No OT follow up;Supervision/Assistance - 24 hour(resume Outpatient for L shoulder )    Equipment Recommendations  None recommended by OT    Recommendations for Other Services       Precautions / Restrictions Precautions Precautions: Fall Restrictions Weight Bearing Restrictions: Yes LUE Weight Bearing: Non weight bearing      Mobility Bed Mobility               General bed mobility comments: OOB in recliner upon entry   Transfers Overall transfer level: Needs assistance Equipment used: 1 person hand held assist Transfers: Sit to/from Stand Sit to Stand: Min guard         General transfer comment: for safety    Balance Overall balance assessment: Mild deficits observed, not formally tested(preference to 1 UE support  dynamically)                                         ADL either performed or assessed with clinical judgement   ADL Overall ADL's : Needs assistance/impaired     Grooming: Min guard;Standing;Wash/dry hands   Upper Body Bathing: Minimal assistance;Sitting   Lower Body Bathing: Min guard;Sit to/from stand   Upper Body Dressing : Set up;Sitting   Lower Body Dressing: Min guard;Sit to/from stand   Toilet Transfer: Min guard;Ambulation Toilet Transfer Details (indicate cue type and reason): simulated to/from recliner          Functional mobility during ADLs: Min guard General ADL Comments: pt limited by decreased functional use of L UE, impaired balance and generalized weakness      Vision   Vision Assessment?: No apparent visual deficits     Perception     Praxis      Pertinent Vitals/Pain Pain Assessment: Faces Faces Pain Scale: Hurts a little bit Pain Location: L shoulder discomfort  Pain Descriptors / Indicators: Discomfort Pain Intervention(s): Monitored during session;Limited activity within patient's tolerance     Hand Dominance Right   Extremity/Trunk Assessment Upper Extremity Assessment Upper Extremity Assessment: LUE deficits/detail LUE Deficits / Details: recent L shoulder reverse arthroplasty (4/22) with limited ROM; pt reports able to do pendulums and currently going to OP PT  LUE Coordination: decreased gross motor   Lower Extremity Assessment Lower Extremity Assessment: Defer to PT evaluation  Communication Communication Communication: No difficulties   Cognition Arousal/Alertness: Awake/alert Behavior During Therapy: WFL for tasks assessed/performed Overall Cognitive Status: Within Functional Limits for tasks assessed                                     General Comments       Exercises     Shoulder Instructions      Home Living Family/patient expects to be discharged to:: Private  residence Living Arrangements: Children Available Help at Discharge: Family;Available 24 hours/day Type of Home: House Home Access: Stairs to enter [employer] of Steps: 1   Home Layout: One level     Bathroom Shower/Tub: Occupational psychologist: Standard Bathroom Accessibility: Yes   Home Equipment: Bedside commode;Shower seat   Additional Comments: lives with daughter and son in [place]. daughter works from home.      Prior Functioning/Environment Level of Independence: Independent                 OT Problem List: Decreased strength;Decreased range of motion;Decreased activity tolerance;Impaired balance (sitting and/or standing);Pain;Impaired UE functional use      OT Treatment/Interventions: Self-care/ADL training;Therapeutic exercise;DME and/or AE instruction;Therapeutic activities;Patient/family education;Balance training    OT Goals(Current goals can be found in the care plan section) Acute Rehab OT Goals Patient Stated Goal: to get home  OT Goal Formulation: With patient Time For Goal Achievement: 08/03/19 Potential to Achieve Goals: Good  OT Frequency: Min 2X/week   Barriers to D/C:            Co-evaluation              AM-PAC OT "6 Clicks" Daily Activity     Outcome Measure Help from another person eating meals?: A Little Help from another person taking care of personal grooming?: A Little Help from another person toileting, which includes using toliet, bedpan, or urinal?: A Little Help from another person bathing (including washing, rinsing, drying)?: A Little Help from another person to put on and taking off regular upper body clothing?: A Little Help from another person to put on and taking off regular lower body clothing?: A Little 6 Click Score: 18   End of Session Nurse Communication: Mobility status  Activity Tolerance: Patient tolerated treatment well Patient left: in chair;with call bell/phone within reach;with chair  alarm set  OT Visit Diagnosis: Unsteadiness on feet (R26.81);Muscle weakness (generalized) (M62.81);Pain Pain - Right/Left: Left Pain - part of body: Shoulder                Time: [id number] OT Time Calculation (min): 17 min Charges:  OT General Charges $OT Visit: 1 Visit OT Evaluation $OT Eval Low Complexity: 1 Low  Jolaine Artist, OT Acute Rehabilitation Services Pager [phone] Office [phone]    Delight Stare 07/20/2019, 12:41 PM
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
## 2019-07-21 ENCOUNTER — Encounter (HOSPITAL_COMMUNITY): Payer: Self-pay | Admitting: Student

## 2019-07-21 DIAGNOSIS — I5033 Acute on chronic diastolic (congestive) heart failure: Secondary | ICD-10-CM

## 2019-07-21 DIAGNOSIS — A4151 Sepsis due to Escherichia coli [E. coli]: Principal | ICD-10-CM

## 2019-07-21 DIAGNOSIS — D509 Iron deficiency anemia, unspecified: Secondary | ICD-10-CM

## 2019-07-21 LAB — RENAL FUNCTION PANEL
Albumin: 2.2 g/dL — ABNORMAL LOW (ref 3.5–5.0)
Anion gap: 7 (ref 5–15)
BUN: 21 mg/dL (ref 8–23)
CO2: 25 mmol/L (ref 22–32)
Calcium: 8.1 mg/dL — ABNORMAL LOW (ref 8.9–10.3)
Chloride: 105 mmol/L (ref 98–111)
Creatinine, Ser: 0.98 mg/dL (ref 0.44–1.00)
GFR calc Af Amer: >60 mL/min (ref >60)
GFR calc non Af Amer: 53 mL/min — ABNORMAL LOW (ref >60)
Glucose, Bld: 98 mg/dL (ref 70–99)
Phosphorus: 1.9 mg/dL — ABNORMAL LOW (ref 2.5–4.6)
Potassium: 3.6 mmol/L (ref 3.5–5.1)
Sodium: 137 mmol/L (ref 135–145)

## 2019-07-21 LAB — URINE CULTURE: Culture: >=100000 — AB

## 2019-07-21 LAB — CBC
HCT: 30.8 % — ABNORMAL LOW (ref 36.0–46.0)
Hemoglobin: 10 g/dL — ABNORMAL LOW (ref 12.0–15.0)
MCH: 31.1 pg (ref 26.0–34.0)
MCHC: 32.5 g/dL (ref 30.0–36.0)
MCV: 95.7 fL (ref 80.0–100.0)
Platelets: 158 10*3/uL (ref 150–400)
RBC: 3.22 MIL/uL — ABNORMAL LOW (ref 3.87–5.11)
RDW: 14.1 % (ref 11.5–15.5)
WBC: 10.4 10*3/uL (ref 4.0–10.5)
nRBC: 0 % (ref 0.0–0.2)

## 2019-07-21 LAB — TROPONIN I (HIGH SENSITIVITY)
Troponin I (High Sensitivity): 33 ng/L — ABNORMAL HIGH (ref <18)
Troponin I (High Sensitivity): 28 ng/L — ABNORMAL HIGH (ref <18)

## 2019-07-21 LAB — MAGNESIUM: Magnesium: 1.8 mg/dL (ref 1.7–2.4)

## 2019-07-21 MED ORDER — SIMETHICONE 80 MG PO CHEW
80.0000 mg | CHEWABLE_TABLET | Freq: Four times a day (QID) | ORAL | Status: DC | PRN
  Administered 2019-07-21 – 2019-07-23 (×3): 80 mg via ORAL
  Filled 2019-07-21 (×3): qty 1

## 2019-07-21 MED ORDER — POTASSIUM PHOSPHATES 15 MMOLE/5ML IV SOLN
30.0000 mmol | Freq: Once | INTRAVENOUS | Status: AC
  Administered 2019-07-21: 30 mmol via INTRAVENOUS
  Filled 2019-07-21: qty 10

## 2019-07-21 NOTE — Plan of Care (Signed)
  Problem: Coping: Goal: Level of anxiety will decrease Outcome: Progressing   Problem: Pain Managment: Goal: General experience of comfort will improve Outcome: Progressing   
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
## 2019-07-22 LAB — CULTURE, BLOOD (ROUTINE X 2): Special Requests: ADEQUATE

## 2019-07-22 LAB — CBC
HCT: 33.6 % — ABNORMAL LOW (ref 36.0–46.0)
Hemoglobin: 10.8 g/dL — ABNORMAL LOW (ref 12.0–15.0)
MCH: 30.8 pg (ref 26.0–34.0)
MCHC: 32.1 g/dL (ref 30.0–36.0)
MCV: 95.7 fL (ref 80.0–100.0)
Platelets: 182 10*3/uL (ref 150–400)
RBC: 3.51 MIL/uL — ABNORMAL LOW (ref 3.87–5.11)
RDW: 14.4 % (ref 11.5–15.5)
WBC: 10.8 10*3/uL — ABNORMAL HIGH (ref 4.0–10.5)
nRBC: 0 % (ref 0.0–0.2)

## 2019-07-22 LAB — RENAL FUNCTION PANEL
Albumin: 2.2 g/dL — ABNORMAL LOW (ref 3.5–5.0)
Anion gap: 8 (ref 5–15)
BUN: 19 mg/dL (ref 8–23)
CO2: 25 mmol/L (ref 22–32)
Calcium: 8.4 mg/dL — ABNORMAL LOW (ref 8.9–10.3)
Chloride: 106 mmol/L (ref 98–111)
Creatinine, Ser: 0.69 mg/dL (ref 0.44–1.00)
GFR calc Af Amer: >60 mL/min (ref >60)
GFR calc non Af Amer: >60 mL/min (ref >60)
Glucose, Bld: 99 mg/dL (ref 70–99)
Phosphorus: 2.4 mg/dL — ABNORMAL LOW (ref 2.5–4.6)
Potassium: 4.3 mmol/L (ref 3.5–5.1)
Sodium: 139 mmol/L (ref 135–145)

## 2019-07-22 LAB — MAGNESIUM: Magnesium: 1.8 mg/dL (ref 1.7–2.4)

## 2019-07-22 MED ORDER — MAGNESIUM SULFATE 2 GM/50ML IV SOLN
2.0000 g | Freq: Once | INTRAVENOUS | Status: AC
  Administered 2019-07-22: 2 g via INTRAVENOUS
  Filled 2019-07-22: qty 50

## 2019-07-22 MED ORDER — FUROSEMIDE 20 MG PO TABS
20.0000 mg | ORAL_TABLET | Freq: Every day | ORAL | Status: DC
  Administered 2019-07-22 – 2019-07-24 (×3): 20 mg via ORAL
  Filled 2019-07-22 (×3): qty 1

## 2019-07-22 MED ORDER — MOMETASONE FURO-FORMOTEROL FUM 200-5 MCG/ACT IN AERO
2.0000 | INHALATION_SPRAY | Freq: Two times a day (BID) | RESPIRATORY_TRACT | Status: DC
  Administered 2019-07-23 – 2019-07-24 (×3): 2 via RESPIRATORY_TRACT
  Filled 2019-07-22 (×2): qty 8.8

## 2019-07-22 NOTE — Progress Notes (Addendum)
Physical Therapy Treatment Patient Details Name: [name] MRN: [id number] DOB: [birth year] Today's Date: 07/22/2019    History of Present Illness [name] is a 85 y.o. female with medical history significant for CAD, paroxysmal supraventricular tachycardia, bronchiectasis/sarcoidosis, hyperlipidemia, and status post left shoulder reverse arthroplasty on 06/13/2019 who presents to the ED for evaluation of fever and generally feeling unwell.   Work up includes sepsis due to UTI.    PT Comments    Pt admitted with above diagnosis. Pt was able to ambulate without device with min to min guard assist and cues for pursed lip breathing as her DOE was 3/4 at end of walk with pt taking several standing rest breaks.  Pt O2 saturation was 89% on RA with activity. Pt without LOB however does get fatigued and was audibly wheezing at end of walk. Pt too fatigued to walk again and measure the ambulatory pulse ox. Talked with nurse who messaged MD regarding pts inhalers at home and question whether pt should have pulmonary consult.  Will follow acutely.  Pt currently with functional limitations due to endurance deficits. Pt will benefit from skilled PT to increase their independence and safety with mobility to allow discharge to the venue listed below.     Follow Up Recommendations  Home health PT     Equipment Recommendations  None recommended by PT    Recommendations for Other Services       Precautions / Restrictions Precautions Precautions: Fall Restrictions Weight Bearing Restrictions: Yes LUE Weight Bearing: Non weight bearing    Mobility  Bed Mobility               General bed mobility comments: Pt in chair on arrival  Transfers Overall transfer level: Needs assistance Equipment used: 1 person hand held assist Transfers: Sit to/from Stand Sit to Stand: Min assist;Min guard         General transfer comment: min guard from chair; cues to not push with  LUE  Ambulation/Gait Ambulation/Gait assistance: Min assist Gait Distance (Feet): 95 Feet Assistive device: 1 person hand held assist;None Gait Pattern/deviations: Step-through pattern;Decreased stride length Gait velocity: slower Gait velocity interpretation: <1.8 ft/sec, indicate of risk for recurrent falls General Gait Details: generally steady, but 3/4 dysnea and incr. WOB with audible wheezing at end of walk; pt had to take several standing rest breaks and O2 sats 89% on RA.  Called nursing to ask about pts meds as pt did have a rescue inhaler at home and is not on any inhalers here in hospital currently.  Nurse messaged MD.    Stairs             Wheelchair Mobility    Modified Rankin (Stroke Patients Only)       Balance Overall balance assessment: Mild deficits observed, not formally tested                                          Cognition Arousal/Alertness: Awake/alert Behavior During Therapy: WFL for tasks assessed/performed Overall Cognitive Status: Within Functional Limits for tasks assessed                                        Exercises      General Comments        Pertinent Vitals/Pain  Pain Assessment: Faces Faces Pain Scale: Hurts a little bit Pain Location: L shoulder discomfort  Pain Descriptors / Indicators: Discomfort Pain Intervention(s): Limited activity within patient's tolerance;Monitored during session;Repositioned    Home Living                      Prior Function            PT Goals (current goals can now be found in the care plan section) Acute Rehab PT Goals Patient Stated Goal: to get home  Time For Goal Achievement: 08/02/19 Potential to Achieve Goals: Good Progress towards PT goals: Progressing toward goals    Frequency    Min 3X/week      PT Plan Discharge plan needs to be updated    Co-evaluation              AM-PAC PT "6 Clicks" Mobility   Outcome  Measure  Help needed turning from your back to your side while in a flat bed without using bedrails?: A Little Help needed moving from lying on your back to sitting on the side of a flat bed without using bedrails?: A Little Help needed moving to and from a bed to a chair (including a wheelchair)?: A Little Help needed standing up from a chair using your arms (e.g., wheelchair or bedside chair)?: A Little Help needed to walk in hospital room?: A Little Help needed climbing 3-5 steps with a railing? : A Little 6 Click Score: 18    End of Session Equipment Utilized During Treatment: Gait belt Activity Tolerance: Patient tolerated treatment well;Patient limited by fatigue Patient left: with call bell/phone within reach;in chair;with nursing/sitter in room Nurse Communication: Mobility status PT Visit Diagnosis: Other abnormalities of gait and mobility (R26.89);Difficulty in walking, not elsewhere classified (R26.2)     Time: [id number] PT Time Calculation (min) (ACUTE ONLY): 21 min  Charges:  $Gait Training: 8-22 mins                     Charie Pinkus W,PT [place] Pager:  [phone]  Office:  [place] 07/22/2019, 3:14 PM

## 2019-07-22 NOTE — Progress Notes (Addendum)
SATURATION:  Patient Saturations on Room Air at Rest = 92%  Patient Saturations on [employer] while Ambulating = 89%  Please briefly explain why patient may need home oxygen:Pt desat to 89% with audible wheezing at end of walk and had to take standing rest breaks without O2. [name],PT Acute Rehabilitation Services Pager:  [phone]  Office:  [phone]
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
## 2019-07-22 NOTE — Plan of Care (Signed)
  Problem: Education: Goal: Knowledge of General Education information will improve Description Including pain rating scale, medication(s)/side effects and non-pharmacologic comfort measures Outcome: Progressing   

## 2019-07-22 NOTE — Progress Notes (Addendum)
Occupational Therapy Treatment Patient Details Name: [name] MRN: [id number] DOB: [birth year] Today's Date: 07/22/2019    History of present illness [name] is a 85 y.o. female with medical history significant for CAD, paroxysmal supraventricular tachycardia, bronchiectasis/sarcoidosis, hyperlipidemia, and status post left shoulder reverse arthroplasty on 06/13/2019 who presents to the ED for evaluation of fever and generally feeling unwell.   Work up includes sepsis due to UTI.   OT comments  Pt making good progress towards OT goals this session. Pt continues to present with decreased activity tolerance, generalized weakness and decreased functional use of LUE impacting pts ability to engage in BADLs. Session focus on UB/LB ADLs at sink, functional mobility and toileting. Overall, pt requires supervision- set- up assist for UB ADLs with pt demonstrating improved functional use of LUE and supervision- MOD A for LB ADLs. Pt completes functional mobility with no AD and HHA; MIN A to rise from low surfaces d/t decreased ability to use LUE to power into standing. Agree with DC plan below, will follow acutely per POC.    Follow Up Recommendations  No OT follow up;Supervision/Assistance - 24 hour;Other (comment)(resume Outpatient for L shoulder)    Equipment Recommendations  None recommended by OT    Recommendations for Other Services      Precautions / Restrictions Precautions Precautions: Fall Restrictions Weight Bearing Restrictions: Yes LUE Weight Bearing: Non weight bearing       Mobility Bed Mobility Overal bed mobility: Needs Assistance Bed Mobility: Supine to Sit     Supine to sit: Min assist;HOB elevated     General bed mobility comments: pt reaching for OTA to elevate trunk with HOB elevated  Transfers Overall transfer level: Needs assistance Equipment used: 1 person hand held assist Transfers: Sit to/from Stand Sit to Stand: Min assist;Min guard          General transfer comment: min guard from EOB; MIN A from low toilet seat. cues to not push with LUE    Balance Overall balance assessment: Mild deficits observed, not formally tested                                         ADL either performed or assessed with clinical judgement   ADL Overall ADL's : Needs assistance/impaired     Grooming: Wash/dry face;Oral care;Sitting;Standing;Supervision/safety;Set up Grooming Details (indicate cue type and reason): standing for oral care;seated for washing face Upper Body Bathing: Supervision/ safety;Sitting;Set up   Lower Body Bathing: Sitting/lateral leans;Supervison/ safety;Set up   Upper Body Dressing : Set up;Sitting Pt verbalize compensatory technique for UB dressing with LUE   Lower Body Dressing: Moderate assistance;Sit to/from stand Lower Body Dressing Details (indicate cue type and reason): to don fresh briefs; MOD A d/t fatigue Toilet Transfer: Ambulation;Minimal assistance Toilet Transfer Details (indicate cue type and reason): MIN A to ascend from low toilet seat; cues to maintain NWB Toileting- Clothing Manipulation and Hygiene: Supervision/safety;Sitting/lateral lean     Tub/Shower Transfer Details (indicate cue type and reason): pt reports walkin shower with seat Functional mobility during ADLs: Min guard General ADL Comments: pt continues to present with generalized weakness, decreased activity tolerance and decreased functional use of LUE. Pt able to complete full ADL this session at sink. supervision- MIN A overall with no AD     Vision       Perception     Praxis  Cognition Arousal/Alertness: Awake/alert Behavior During Therapy: WFL for tasks assessed/performed Overall Cognitive Status: Within Functional Limits for tasks assessed                                          Exercises     Shoulder Instructions       General Comments  VSS; pt reports mild SOB during  session needing seated rest break however pt on RA with O2 92-98% during session; pt request telebox be removed during session; RN aware      Pertinent Vitals/ Pain       Pain Assessment: No/denies pain  Home Living                                          Prior Functioning/Environment              Frequency  Min 2X/week        Progress Toward Goals  OT Goals(current goals can now be found in the care plan section)  Progress towards OT goals: Progressing toward goals  Acute Rehab OT Goals Patient Stated Goal: to get home  OT Goal Formulation: With patient Time For Goal Achievement: 08/03/19 Potential to Achieve Goals: Good  Plan Discharge plan remains appropriate    Co-evaluation                 AM-PAC OT "6 Clicks" Daily Activity     Outcome Measure   Help from another person eating meals?: None Help from another person taking care of personal grooming?: None Help from another person toileting, which includes using toliet, bedpan, or urinal?: None Help from another person bathing (including washing, rinsing, drying)?: A Little Help from another person to put on and taking off regular upper body clothing?: A Little Help from another person to put on and taking off regular lower body clothing?: A Little 6 Click Score: 21    End of Session    OT Visit Diagnosis: Unsteadiness on feet (R26.81);Muscle weakness (generalized) (M62.81);Pain Pain - Right/Left: Left Pain - part of body: Shoulder   Activity Tolerance Patient tolerated treatment well   Patient Left in chair;with call bell/phone within reach;with chair alarm set;with nursing/sitter in room   Nurse Communication Mobility status;Other (comment)(had BM)        [id number] OT Time Calculation (min): 44 min  Charges: OT General Charges $OT Visit: 1 Visit OT Treatments $Self Care/Home Management : 38-52 mins  Lanier Clam., COTA/L Acute Rehabilitation  Services [phone]    Ihor Gully 07/22/2019, 10:52 AM

## 2019-07-23 LAB — RENAL FUNCTION PANEL
Albumin: 2.3 g/dL — ABNORMAL LOW (ref 3.5–5.0)
Anion gap: 10 (ref 5–15)
BUN: 18 mg/dL (ref 8–23)
CO2: 26 mmol/L (ref 22–32)
Calcium: 8.5 mg/dL — ABNORMAL LOW (ref 8.9–10.3)
Chloride: 103 mmol/L (ref 98–111)
Creatinine, Ser: 0.59 mg/dL (ref 0.44–1.00)
GFR calc Af Amer: >60 mL/min (ref >60)
GFR calc non Af Amer: >60 mL/min (ref >60)
Glucose, Bld: 110 mg/dL — ABNORMAL HIGH (ref 70–99)
Phosphorus: 2.7 mg/dL (ref 2.5–4.6)
Potassium: 4.3 mmol/L (ref 3.5–5.1)
Sodium: 139 mmol/L (ref 135–145)

## 2019-07-23 LAB — CBC
HCT: 33.8 % — ABNORMAL LOW (ref 36.0–46.0)
Hemoglobin: 10.8 g/dL — ABNORMAL LOW (ref 12.0–15.0)
MCH: 30.2 pg (ref 26.0–34.0)
MCHC: 32 g/dL (ref 30.0–36.0)
MCV: 94.4 fL (ref 80.0–100.0)
Platelets: 238 10*3/uL (ref 150–400)
RBC: 3.58 MIL/uL — ABNORMAL LOW (ref 3.87–5.11)
RDW: 14.6 % (ref 11.5–15.5)
WBC: 10 10*3/uL (ref 4.0–10.5)
nRBC: 0.2 % (ref 0.0–0.2)

## 2019-07-23 LAB — MAGNESIUM: Magnesium: 2.1 mg/dL (ref 1.7–2.4)

## 2019-07-23 MED ORDER — PREDNISONE 20 MG PO TABS
30.0000 mg | ORAL_TABLET | Freq: Every day | ORAL | Status: DC
  Administered 2019-07-23 – 2019-07-24 (×2): 30 mg via ORAL
  Filled 2019-07-23 (×2): qty 1

## 2019-07-23 NOTE — Care Management Important Message (Signed)
Important Message  Patient Details  Name: [name] MRN: [id number] Date of Birth: [birth year]   Medicare Important Message Given:  Yes     Shelda Altes 07/23/2019, 12:38 PM

## 2019-07-23 NOTE — Progress Notes (Signed)
Nutrition Follow-up  DOCUMENTATION CODES:   Not applicable  INTERVENTION:   -Continue Magic cup TID with meals, each supplement provides 290 kcal and 9 grams of protein -Continue Ensure Enlive po TID, each supplement provides 350 kcal and 20 grams of protein -Continue MVI with minerals daily  NUTRITION DIAGNOSIS:   Inadequate oral intake related to poor appetite as evidenced by per patient/family report.  Ongoing  GOAL:   Patient will meet greater than or equal to 90% of their needs  Progressing   MONITOR:   PO intake, Supplement acceptance, Weight trends, Labs, I & O's  REASON FOR ASSESSMENT:   Consult Poor PO  ASSESSMENT:   Pt with a PMH significant for CAD, PSVT, bronchiectasis, sarcoidosis, HLD, recent L shoulder arthroplasty admitted with sepsis 2/2 UTI and acute metabolic encephalopathy.  Reviewed I/O's: +78 ml x 24 hours and +1.9 L since admission  UOP: 851 ml x 24 hours  Pt sitting up in recliner chair at time of visit. She complains of not feeling well secondary to abdominal pain- she changed her lunch order to a soup and sandwich because of it. Per her report, she was eating very well PTA- 3 meals per day (Breakfast: cereal and muffin; Lunch: sandwich; Dinner: meat, starch, and vegetable). She was also consuming 1 Premier Protein supplement daily.   Pt reports poor oral intake during hospitalization secondary to dislike of hospital food. Noted meal completion 25-100%. Reviewed menu with pt and offered to obtain her meal orders, but pt politely declined ("I can't even think about that right now").   Pt has been consuming Ensure Enlive supplements. While she prefers Premier Protein, she is amenable to continue Ensure Enlive due to decreased oral intake. Discussed importance of good meal and supplement intake to promote healing. Pt reports she will likely discharge home tomorrow and thinks her intake will increase, as she will have access to foods that she is more  familiar with.   Medications reviewed and include cardizem, lasix, and melatonin.  Labs reviewed.    NUTRITION - FOCUSED PHYSICAL EXAM:    Most Recent Value  Orbital Region  Mild depletion  Upper Arm Region  Mild depletion  Thoracic and Lumbar Region  No depletion  Buccal Region  No depletion  Temple Region  No depletion  Clavicle Bone Region  Mild depletion  Clavicle and Acromion Bone Region  No depletion  Scapular Bone Region  No depletion  Dorsal Hand  No depletion  Patellar Region  Mild depletion  Anterior Thigh Region  Mild depletion  Posterior Calf Region  Mild depletion  Edema (RD Assessment)  None  Hair  Reviewed  Eyes  Reviewed  Mouth  Reviewed  Skin  Reviewed  Nails  Reviewed       Diet Order:   Diet Order            Diet Heart Room service appropriate? Yes; Fluid consistency: Thin  Diet effective now              EDUCATION NEEDS:   No education needs have been identified at this time  Skin:  Skin Assessment: Reviewed RN Assessment  Last BM:  07/22/19  Height:   Ht Readings from Last 1 Encounters:  07/18/19 5\' 3"  (1.6 m)    Weight:   Wt Readings from Last 1 Encounters:  07/23/19 57.9 kg   BMI:  Body mass index is 22.6 kg/m.  Estimated Nutritional Needs:   Kcal:  1400-1600  Protein:  70-85 grams  Fluid:  >/= 1.4 L/d    Loistine Chance, RD, LDN, [place] Registered Dietitian II Certified Diabetes Care and Education Specialist Please refer to [facility] for RD and/or RD on-call/weekend/after hours pager
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
## 2019-07-23 NOTE — Progress Notes (Signed)
Physical Therapy Treatment Patient Details Name: [name] MRN: [id number] DOB: 10/26/1934 Today's Date: 07/23/2019    History of Present Illness [name] is a 86 y.o. female with medical history significant for CAD, paroxysmal supraventricular tachycardia, bronchiectasis/sarcoidosis, hyperlipidemia, and status post left shoulder reverse arthroplasty on 06/13/2019 who presents to the ED for evaluation of fever and generally feeling unwell.   Work up includes sepsis due to UTI.    PT Comments    Pt admitted with above diagnosis. Pt was able to ambulate with safe gait without device with O2 sats >90% and no LOB./ Still needed a few standing rest breaks and DOE 3/4 by end of walk but overall tolerated a little better than yesterday with improved saturations.  Encouarged to pursed lip breathe.   Pt currently with functional limitations due to the deficits listed below (see PT Problem List). Pt will benefit from skilled PT to increase their independence and safety with mobility to allow discharge to the venue listed below.     Follow Up Recommendations  Home health PT     Equipment Recommendations  None recommended by PT    Recommendations for Other Services       Precautions / Restrictions Precautions Precautions: Fall Restrictions Weight Bearing Restrictions: Yes LUE Weight Bearing: Non weight bearing    Mobility  Bed Mobility               General bed mobility comments: Pt in chair on arrival  Transfers Overall transfer level: Needs assistance Equipment used: 1 person hand held assist Transfers: Sit to/from Stand Sit to Stand: Min guard         General transfer comment: min guard from chair; cues to not push with LUE  Ambulation/Gait Ambulation/Gait assistance: Min guard Gait Distance (Feet): 125 Feet Assistive device: 1 person hand held assist;None Gait Pattern/deviations: Step-through pattern;Decreased stride length;Trunk flexed;Drifts  right/left Gait velocity: slower Gait velocity interpretation: <1.8 ft/sec, indicate of risk for recurrent falls General Gait Details: generally steady, but 3/4 dysnea and incr. WOB. Pt still took several standing rest breaks and O2 sats >90% on RA.  Pt did get her Dulera inhaler today.     Stairs             Wheelchair Mobility    Modified Rankin (Stroke Patients Only)       Balance Overall balance assessment: Mild deficits observed, not formally tested                                          Cognition Arousal/Alertness: Awake/alert Behavior During Therapy: WFL for tasks assessed/performed Overall Cognitive Status: Within Functional Limits for tasks assessed                                        Exercises      General Comments        Pertinent Vitals/Pain Pain Assessment: No/denies pain    Home Living                      Prior Function            PT Goals (current goals can now be found in the care plan section) Acute Rehab PT Goals Patient Stated Goal: to get home  Progress towards  PT goals: Progressing toward goals    Frequency    Min 3X/week      PT Plan Current plan remains appropriate    Co-evaluation              AM-PAC PT "6 Clicks" Mobility   Outcome Measure  Help needed turning from your back to your side while in a flat bed without using bedrails?: A Little Help needed moving from lying on your back to sitting on the side of a flat bed without using bedrails?: A Little Help needed moving to and from a bed to a chair (including a wheelchair)?: A Little Help needed standing up from a chair using your arms (e.g., wheelchair or bedside chair)?: A Little Help needed to walk in hospital room?: A Little Help needed climbing 3-5 steps with a railing? : A Little 6 Click Score: 18    End of Session Equipment Utilized During Treatment: Gait belt Activity Tolerance: Patient tolerated  treatment well;Patient limited by fatigue Patient left: with call bell/phone within reach;in chair;with nursing/sitter in room Nurse Communication: Mobility status PT Visit Diagnosis: Other abnormalities of gait and mobility (R26.89);Difficulty in walking, not elsewhere classified (R26.2)     Time: [id number] PT Time Calculation (min) (ACUTE ONLY): 15 min  Charges:  $Gait Training: 8-22 mins                     Macinty[place] Alexa W,PT [place] Pager:  [phone]  Office:  [place] 07/23/2019, 11:59 AM

## 2019-07-24 LAB — MAGNESIUM: Magnesium: 1.9 mg/dL (ref 1.7–2.4)

## 2019-07-24 LAB — RENAL FUNCTION PANEL
Albumin: 2.4 g/dL — ABNORMAL LOW (ref 3.5–5.0)
Anion gap: 13 (ref 5–15)
BUN: 18 mg/dL (ref 8–23)
CO2: 26 mmol/L (ref 22–32)
Calcium: 8.7 mg/dL — ABNORMAL LOW (ref 8.9–10.3)
Chloride: 100 mmol/L (ref 98–111)
Creatinine, Ser: 0.58 mg/dL (ref 0.44–1.00)
GFR calc Af Amer: >60 mL/min (ref >60)
GFR calc non Af Amer: >60 mL/min (ref >60)
Glucose, Bld: 115 mg/dL — ABNORMAL HIGH (ref 70–99)
Phosphorus: 3.3 mg/dL (ref 2.5–4.6)
Potassium: 4.2 mmol/L (ref 3.5–5.1)
Sodium: 139 mmol/L (ref 135–145)

## 2019-07-24 MED ORDER — FUROSEMIDE 20 MG PO TABS: 20.0000 mg | ORAL_TABLET | Freq: Every day | ORAL | 0 refills | Status: DC

## 2019-07-24 MED ORDER — GUAIFENESIN ER 600 MG PO TB12: 600.0000 mg | ORAL_TABLET | Freq: Two times a day (BID) | ORAL | 0 refills | Status: DC

## 2019-07-24 MED ORDER — POTASSIUM CHLORIDE ER 10 MEQ PO TBCR: 10.0000 meq | EXTENDED_RELEASE_TABLET | Freq: Every day | ORAL | 0 refills | Status: DC

## 2019-07-24 MED ORDER — IPRATROPIUM BROMIDE HFA 17 MCG/ACT IN AERS
1.0000 | INHALATION_SPRAY | Freq: Two times a day (BID) | RESPIRATORY_TRACT | 2 refills | Status: DC
Start: 2019-07-24 — End: 2021-08-08

## 2019-07-24 MED ORDER — MOMETASONE FURO-FORMOTEROL FUM 200-5 MCG/ACT IN AERO: 2.0000 | INHALATION_SPRAY | Freq: Two times a day (BID) | RESPIRATORY_TRACT | 0 refills | Status: DC

## 2019-07-24 MED ORDER — SACCHAROMYCES BOULARDII 250 MG PO CAPS: 250.0000 mg | ORAL_CAPSULE | Freq: Two times a day (BID) | ORAL | 0 refills | Status: DC

## 2019-07-24 MED ORDER — PREDNISONE 10 MG PO TABS: 30.0000 mg | ORAL_TABLET | Freq: Every day | ORAL | 0 refills | Status: AC

## 2019-07-24 NOTE — Discharge Instructions (Signed)
Take potassium while your taking lasix. Follow up with PCP to determine when to stop lasix.  Continue with prednisone for 4 more days.

## 2019-07-24 NOTE — Plan of Care (Signed)
  Problem: Education: Goal: Knowledge of General Education information will improve Description: Including pain rating scale, medication(s)/side effects and non-pharmacologic comfort measures 07/24/2019 0942 by Edmonia Lynch, RN Outcome: Adequate for Discharge 07/24/2019 0942 by Edmonia Lynch, RN Outcome: Adequate for Discharge   Problem: Health Behavior/Discharge Planning: Goal: Ability to manage health-related needs will improve 07/24/2019 0942 by Edmonia Lynch, RN Outcome: Adequate for Discharge 07/24/2019 0942 by Edmonia Lynch, RN Outcome: Adequate for Discharge   Problem: Clinical Measurements: Goal: Ability to maintain clinical measurements within normal limits will improve 07/24/2019 0942 by Edmonia Lynch, RN Outcome: Adequate for Discharge 07/24/2019 0942 by Edmonia Lynch, RN Outcome: Adequate for Discharge Goal: Will remain free from infection 07/24/2019 0942 by Edmonia Lynch, RN Outcome: Adequate for Discharge 07/24/2019 0942 by Edmonia Lynch, RN Outcome: Adequate for Discharge Goal: Diagnostic test results will improve 07/24/2019 0942 by Edmonia Lynch, RN Outcome: Adequate for Discharge 07/24/2019 0942 by Edmonia Lynch, RN Outcome: Adequate for Discharge Goal: Respiratory complications will improve 07/24/2019 0942 by Edmonia Lynch, RN Outcome: Adequate for Discharge 07/24/2019 0942 by Edmonia Lynch, RN Outcome: Adequate for Discharge Goal: Cardiovascular complication will be avoided 07/24/2019 0942 by Edmonia Lynch, RN Outcome: Adequate for Discharge 07/24/2019 0942 by Edmonia Lynch, RN Outcome: Adequate for Discharge   Problem: Activity: Goal: Risk for activity intolerance will decrease 07/24/2019 0942 by Edmonia Lynch, RN Outcome: Adequate for Discharge 07/24/2019 0942 by Edmonia Lynch, RN Outcome: Adequate for Discharge   Problem: Nutrition: Goal: Adequate nutrition will be maintained 07/24/2019 0942 by Edmonia Lynch, RN Outcome: Adequate for  Discharge 07/24/2019 0942 by Edmonia Lynch, RN Outcome: Adequate for Discharge   Problem: Coping: Goal: Level of anxiety will decrease 07/24/2019 0942 by Edmonia Lynch, RN Outcome: Adequate for Discharge 07/24/2019 0942 by Edmonia Lynch, RN Outcome: Adequate for Discharge   Problem: Elimination: Goal: Will not experience complications related to bowel motility 07/24/2019 0942 by Edmonia Lynch, RN Outcome: Adequate for Discharge 07/24/2019 0942 by Edmonia Lynch, RN Outcome: Adequate for Discharge Goal: Will not experience complications related to urinary retention 07/24/2019 0942 by Edmonia Lynch, RN Outcome: Adequate for Discharge 07/24/2019 0942 by Edmonia Lynch, RN Outcome: Adequate for Discharge   Problem: Pain Managment: Goal: General experience of comfort will improve 07/24/2019 0942 by Edmonia Lynch, RN Outcome: Adequate for Discharge 07/24/2019 0942 by Edmonia Lynch, RN Outcome: Adequate for Discharge   Problem: Safety: Goal: Ability to remain free from injury will improve 07/24/2019 0942 by Edmonia Lynch, RN Outcome: Adequate for Discharge 07/24/2019 0942 by Edmonia Lynch, RN Outcome: Adequate for Discharge   Problem: Skin Integrity: Goal: Risk for impaired skin integrity will decrease 07/24/2019 0942 by Edmonia Lynch, RN Outcome: Adequate for Discharge 07/24/2019 0942 by Edmonia Lynch, RN Outcome: Adequate for Discharge

## 2019-07-24 NOTE — TOC Transition Note (Signed)
Transition of Care [facility]) - CM/SW Discharge Note   Patient Details  Name: [name] MRN: [id number] Date of Birth: December 28, 1934  Transition of Care [facility]) CM/SW Contact:  Zenon Mayo, RN Phone Number: 07/24/2019, 9:20 AM   Clinical Narrative:    NCM spoke with patient , offered choice for HHPT, patient states she does outpatient therapy right now for her shoulder and would like to continue the physical therapy there as well.  NCM will fax script over to [facility] physical  Therapy phone is  [phone].   Final next level of care: Home/Self Care Barriers to Discharge: No Barriers Identified   Patient Goals and CMS Choice Patient states their goals for this hospitalization and ongoing recovery are:: get to better CMS Medicare.gov Compare Post Acute Care list provided to:: Patient Choice offered to / list presented to : Patient  Discharge Placement                       Discharge Plan and Services                  DME Agency: NA       HH Arranged: NA          Social Determinants of Health (SDOH) Interventions     Readmission Risk Interventions No flowsheet data found.

## 2019-07-24 NOTE — Discharge Summary (Signed)
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 Profile No results for input(s): CHOL, HDL, LDLCALC, TRIG, CHOLHDL, LDLDIRECT in the last 72 hours. Thyroid function studies No results for input(s): TSH, T4TOTAL, T3FREE, THYROIDAB in the last 72 hours.  Invalid input(s): FREET3 Anemia work up No results for input(s): VITAMINB12, FOLATE, FERRITIN, TIBC, IRON, RETICCTPCT in the last 72 hours. Urinalysis    Component Value Date/Time   COLORURINE YELLOW 07/18/2019 1622   APPEARANCEUR HAZY (A) 07/18/2019 1622   LABSPEC 1.021 07/18/2019 1622   PHURINE 5.0 07/18/2019 1622   GLUCOSEU NEGATIVE 07/18/2019 1622   HGBUR MODERATE (A) 07/18/2019 1622   BILIRUBINUR NEGATIVE 07/18/2019 1622   BILIRUBINUR negative 03/21/2018 1328   KETONESUR 5 (A) 07/18/2019 1622   PROTEINUR 100 (A) 07/18/2019 1622   UROBILINOGEN negative (A) 03/21/2018 1328    UROBILINOGEN 0.2 12/08/2008 1204   NITRITE POSITIVE (A) 07/18/2019 1622   LEUKOCYTESUR LARGE (A) 07/18/2019 1622   Sepsis Labs Invalid input(s): PROCALCITONIN,  WBC,  LACTICIDVEN Microbiology Recent Results (from the past 240 hour(s))  Culture, blood (Routine X 2) w Reflex to ID Panel     Status: Abnormal   Collection Time: 07/18/19  3:35 PM   Specimen: BLOOD  Result Value Ref Range Status   Specimen Description BLOOD RIGHT ANTECUBITAL  Final   Special Requests   Final    BOTTLES DRAWN AEROBIC AND ANAEROBIC Blood Culture adequate volume   Culture  Setup Time   Final    GRAM NEGATIVE RODS AEROBIC BOTTLE ONLY CRITICAL VALUE NOTED.  VALUE IS  CONSISTENT WITH PREVIOUSLY REPORTED AND CALLED VALUE.    Culture (A)  Final    ESCHERICHIA COLI SUSCEPTIBILITIES PERFORMED ON PREVIOUS CULTURE WITHIN THE LAST 5 DAYS. Performed at [facility], [place] [address], [place], [place] [address]    Report Status 07/22/2019 FINAL  Final  Culture, blood (Routine X 2) w Reflex to ID Panel     Status: Abnormal   Collection Time: 07/18/19  3:45 PM   Specimen: BLOOD  Result Value Ref Range Status   Specimen Description BLOOD LEFT ANTECUBITAL  Final   Special Requests   Final    BOTTLES DRAWN AEROBIC AND ANAEROBIC Blood Culture results may not be optimal due to an inadequate volume of blood received in culture bottles   Culture  Setup Time   Final    AEROBIC BOTTLE ONLY GRAM NEGATIVE RODS CRITICAL RESULT CALLED TO, READ BACK BY AND VERIFIED WITH: K AMEND [facility] 07/20/19 0347 JDW Performed at [facility], [place] [address], [place],  [address]    Culture ESCHERICHIA COLI (A)  Final   Report Status 07/22/2019 FINAL  Final   Organism ID, Bacteria ESCHERICHIA COLI  Final      Susceptibility   Escherichia coli - MIC*    AMPICILLIN >=32 RESISTANT Resistant     CEFAZOLIN 8 SENSITIVE Sensitive     CEFEPIME <=1 SENSITIVE Sensitive     CEFTAZIDIME <=1 SENSITIVE Sensitive     CEFTRIAXONE <=1  SENSITIVE Sensitive     CIPROFLOXACIN <=0.25 SENSITIVE Sensitive     GENTAMICIN <=1 SENSITIVE Sensitive     IMIPENEM <=0.25 SENSITIVE Sensitive     TRIMETH/SULFA <=20 SENSITIVE Sensitive     AMPICILLIN/SULBACTAM >=32 RESISTANT Resistant     PIP/TAZO 8 SENSITIVE Sensitive     * ESCHERICHIA COLI  Blood Culture ID Panel (Reflexed)     Status: Abnormal   Collection Time: 07/18/19  3:45 PM  Result Value Ref Range Status   Enterococcus species NOT DETECTED NOT DETECTED Final   Listeria monocytogenes NOT DETECTED NOT DETECTED Final   Staphylococcus species NOT DETECTED NOT DETECTED Final   Staphylococcus aureus (BCID) NOT DETECTED NOT DETECTED Final   Streptococcus species NOT DETECTED NOT DETECTED Final   Streptococcus agalactiae NOT DETECTED NOT DETECTED Final   Streptococcus pneumoniae NOT DETECTED NOT DETECTED Final   Streptococcus pyogenes NOT DETECTED NOT DETECTED Final   Acinetobacter baumannii NOT DETECTED NOT DETECTED Final   Enterobacteriaceae species DETECTED (A) NOT DETECTED Final    Comment: Enterobacteriaceae represent a large family of gram-negative bacteria, not a single organism. CRITICAL RESULT CALLED TO, READ BACK BY AND VERIFIED WITH: K AMEND PHARMD 07/20/19 0619 JDW    Enterobacter cloacae complex NOT DETECTED NOT DETECTED Final   Escherichia coli DETECTED (A) NOT DETECTED Final    Comment: CRITICAL RESULT CALLED TO, READ BACK BY AND VERIFIED WITH:  K AMEND PHARMD 07/20/19 0619 JDW    Klebsiella oxytoca NOT DETECTED NOT DETECTED Final   Klebsiella pneumoniae NOT DETECTED NOT DETECTED Final   Proteus species NOT DETECTED NOT DETECTED Final   Serratia marcescens NOT DETECTED NOT DETECTED Final   Carbapenem resistance NOT DETECTED NOT DETECTED Final   Haemophilus influenzae NOT DETECTED NOT DETECTED Final   Neisseria meningitidis NOT DETECTED NOT DETECTED Final   Pseudomonas aeruginosa NOT DETECTED NOT DETECTED Final   Candida albicans NOT DETECTED NOT DETECTED Final    Candida glabrata NOT DETECTED NOT DETECTED Final   Candida krusei NOT DETECTED NOT DETECTED Final  Candida parapsilosis NOT DETECTED NOT DETECTED Final   Candida tropicalis NOT DETECTED NOT DETECTED Final    Comment: Performed at [facility], [place] [address] [place], [place] [address]  SARS Coronavirus 2 by RT PCR (hospital order, performed in [facility] hospital lab) Nasopharyngeal Nasopharyngeal Swab     Status: None   Collection Time: 07/18/19  4:09 PM   Specimen: Nasopharyngeal Swab  Result Value Ref Range Status   SARS Coronavirus 2 NEGATIVE NEGATIVE Final    Comment: (NOTE) SARS-CoV-2 target nucleic acids are NOT DETECTED. The SARS-CoV-2 RNA is generally detectable in upper and lower respiratory specimens during the acute phase of infection. The lowest concentration of SARS-CoV-2 viral copies this assay can detect is 250 copies / mL. A negative result does not preclude SARS-CoV-2 infection and should not be used as the sole basis for treatment or other patient management decisions.  A negative result may occur with improper specimen collection / handling, submission of specimen other than nasopharyngeal swab, presence of viral mutation(s) within the areas targeted by this assay, and inadequate number of viral copies (<250 copies / mL). A negative result must be combined with clinical observations, patient history, and epidemiological information. Fact Sheet for Patients:   StrictlyIdeas.no Fact Sheet for Healthcare Providers: BankingDealers.co.za This test is not yet approved or cleared  by the [place] FDA and has been authorized for detection and/or diagnosis of SARS-CoV-2 by FDA under an Emergency Use Authorization (EUA).  This EUA will remain in effect (meaning this test can be used) for the duration of the COVID-19 declaration under Section 564(b)(1) of the Act, 21 U.S.C. section 360bbb-3(b)(1), unless the  authorization is terminated or revoked sooner. Performed at [facility], [place] [address] [place], [place] [address]   Urine culture     Status: Abnormal   Collection Time: 07/18/19  4:31 PM   Specimen: In/Out Cath Urine  Result Value Ref Range Status   Specimen Description IN/OUT CATH URINE  Final   Special Requests   Final    NONE Performed at [facility], [place] [address] [place], [place] [address]    Culture >=100,000 COLONIES/mL ESCHERICHIA COLI (A)  Final   Report Status 07/21/2019 FINAL  Final   Organism ID, Bacteria ESCHERICHIA COLI (A)  Final      Susceptibility   Escherichia coli - MIC*    AMPICILLIN >=32 RESISTANT Resistant     CEFAZOLIN >=64 RESISTANT Resistant     CEFTRIAXONE <=1 SENSITIVE Sensitive     CIPROFLOXACIN <=0.25 SENSITIVE Sensitive     GENTAMICIN <=1 SENSITIVE Sensitive     IMIPENEM <=0.25 SENSITIVE Sensitive     NITROFURANTOIN <=16 SENSITIVE Sensitive     TRIMETH/SULFA <=20 SENSITIVE Sensitive     AMPICILLIN/SULBACTAM >=32 RESISTANT Resistant     PIP/TAZO 8 SENSITIVE Sensitive     * >=100,000 COLONIES/mL ESCHERICHIA COLI     Time coordinating discharge: 40 minutes  SIGNED:   Elmarie Shiley, MD  [facility]

## 2019-07-29 ENCOUNTER — Emergency Department (HOSPITAL_COMMUNITY): Payer: Medicare Other

## 2019-07-29 ENCOUNTER — Encounter (HOSPITAL_COMMUNITY): Payer: Self-pay

## 2019-07-29 ENCOUNTER — Other Ambulatory Visit: Payer: Self-pay

## 2019-07-29 ENCOUNTER — Inpatient Hospital Stay (HOSPITAL_COMMUNITY)
Admission: EM | Admit: 2019-07-29 | Discharge: 2019-08-01 | DRG: 660 | Disposition: A | Discharge status: Home / Self Care | Payer: Medicare Other | Attending: Family Medicine | Admitting: Family Medicine

## 2019-07-29 DIAGNOSIS — I2721 Secondary pulmonary arterial hypertension: Secondary | ICD-10-CM | POA: Diagnosis present

## 2019-07-29 DIAGNOSIS — Z881 Allergy status to other antibiotic agents status: Secondary | ICD-10-CM

## 2019-07-29 DIAGNOSIS — D860 Sarcoidosis of lung: Secondary | ICD-10-CM | POA: Diagnosis present

## 2019-07-29 DIAGNOSIS — K219 Gastro-esophageal reflux disease without esophagitis: Secondary | ICD-10-CM | POA: Diagnosis present

## 2019-07-29 DIAGNOSIS — Z885 Allergy status to narcotic agent status: Secondary | ICD-10-CM

## 2019-07-29 DIAGNOSIS — Z7951 Long term (current) use of inhaled steroids: Secondary | ICD-10-CM

## 2019-07-29 DIAGNOSIS — Z79899 Other long term (current) drug therapy: Secondary | ICD-10-CM | POA: Diagnosis not present

## 2019-07-29 DIAGNOSIS — D509 Iron deficiency anemia, unspecified: Secondary | ICD-10-CM | POA: Diagnosis present

## 2019-07-29 DIAGNOSIS — A4151 Sepsis due to Escherichia coli [E. coli]: Secondary | ICD-10-CM

## 2019-07-29 DIAGNOSIS — Z96612 Presence of left artificial shoulder joint: Secondary | ICD-10-CM | POA: Diagnosis present

## 2019-07-29 DIAGNOSIS — Z85828 Personal history of other malignant neoplasm of skin: Secondary | ICD-10-CM | POA: Diagnosis not present

## 2019-07-29 DIAGNOSIS — I2510 Atherosclerotic heart disease of native coronary artery without angina pectoris: Secondary | ICD-10-CM | POA: Diagnosis present

## 2019-07-29 DIAGNOSIS — Z20822 Contact with and (suspected) exposure to covid-19: Secondary | ICD-10-CM | POA: Diagnosis present

## 2019-07-29 DIAGNOSIS — N1330 Unspecified hydronephrosis: Secondary | ICD-10-CM | POA: Diagnosis present

## 2019-07-29 DIAGNOSIS — N12 Tubulo-interstitial nephritis, not specified as acute or chronic: Secondary | ICD-10-CM

## 2019-07-29 DIAGNOSIS — I10 Essential (primary) hypertension: Secondary | ICD-10-CM | POA: Diagnosis present

## 2019-07-29 DIAGNOSIS — I5032 Chronic diastolic (congestive) heart failure: Secondary | ICD-10-CM | POA: Diagnosis present

## 2019-07-29 DIAGNOSIS — N132 Hydronephrosis with renal and ureteral calculous obstruction: Secondary | ICD-10-CM | POA: Diagnosis not present

## 2019-07-29 DIAGNOSIS — N136 Pyonephrosis: Secondary | ICD-10-CM | POA: Diagnosis present

## 2019-07-29 DIAGNOSIS — I110 Hypertensive heart disease with heart failure: Secondary | ICD-10-CM | POA: Diagnosis present

## 2019-07-29 DIAGNOSIS — N390 Urinary tract infection, site not specified: Secondary | ICD-10-CM | POA: Diagnosis not present

## 2019-07-29 DIAGNOSIS — Z7952 Long term (current) use of systemic steroids: Secondary | ICD-10-CM

## 2019-07-29 DIAGNOSIS — I471 Supraventricular tachycardia, unspecified: Secondary | ICD-10-CM | POA: Diagnosis present

## 2019-07-29 DIAGNOSIS — M7501 Adhesive capsulitis of right shoulder: Secondary | ICD-10-CM | POA: Insufficient documentation

## 2019-07-29 DIAGNOSIS — Z7982 Long term (current) use of aspirin: Secondary | ICD-10-CM | POA: Diagnosis not present

## 2019-07-29 DIAGNOSIS — Z96641 Presence of right artificial hip joint: Secondary | ICD-10-CM | POA: Diagnosis present

## 2019-07-29 DIAGNOSIS — M810 Age-related osteoporosis without current pathological fracture: Secondary | ICD-10-CM | POA: Diagnosis present

## 2019-07-29 DIAGNOSIS — G9341 Metabolic encephalopathy: Secondary | ICD-10-CM | POA: Insufficient documentation

## 2019-07-29 DIAGNOSIS — A419 Sepsis, unspecified organism: Secondary | ICD-10-CM | POA: Diagnosis not present

## 2019-07-29 DIAGNOSIS — Z882 Allergy status to sulfonamides status: Secondary | ICD-10-CM

## 2019-07-29 DIAGNOSIS — I519 Heart disease, unspecified: Secondary | ICD-10-CM | POA: Insufficient documentation

## 2019-07-29 DIAGNOSIS — J302 Other seasonal allergic rhinitis: Secondary | ICD-10-CM | POA: Diagnosis present

## 2019-07-29 DIAGNOSIS — Z8249 Family history of ischemic heart disease and other diseases of the circulatory system: Secondary | ICD-10-CM

## 2019-07-29 DIAGNOSIS — D869 Sarcoidosis, unspecified: Secondary | ICD-10-CM | POA: Diagnosis not present

## 2019-07-29 DIAGNOSIS — J479 Bronchiectasis, uncomplicated: Secondary | ICD-10-CM | POA: Diagnosis present

## 2019-07-29 DIAGNOSIS — E785 Hyperlipidemia, unspecified: Secondary | ICD-10-CM | POA: Diagnosis present

## 2019-07-29 HISTORY — DX: Tubulo-interstitial nephritis, not specified as acute or chronic: N12

## 2019-07-29 HISTORY — DX: Unspecified hydronephrosis: N13.30

## 2019-07-29 HISTORY — DX: Sepsis due to Escherichia coli (e. coli): A41.51

## 2019-07-29 LAB — COMPREHENSIVE METABOLIC PANEL
ALT: 25 U/L (ref 0–44)
AST: 17 U/L (ref 15–41)
Albumin: 2.9 g/dL — ABNORMAL LOW (ref 3.5–5.0)
Alkaline Phosphatase: 106 U/L (ref 38–126)
Anion gap: 15 (ref 5–15)
BUN: 22 mg/dL (ref 8–23)
CO2: 22 mmol/L (ref 22–32)
Calcium: 8.4 mg/dL — ABNORMAL LOW (ref 8.9–10.3)
Chloride: 95 mmol/L — ABNORMAL LOW (ref 98–111)
Creatinine, Ser: 0.91 mg/dL (ref 0.44–1.00)
GFR calc Af Amer: >60 mL/min (ref >60)
GFR calc non Af Amer: 58 mL/min — ABNORMAL LOW (ref >60)
Glucose, Bld: 138 mg/dL — ABNORMAL HIGH (ref 70–99)
Potassium: 4 mmol/L (ref 3.5–5.1)
Sodium: 132 mmol/L — ABNORMAL LOW (ref 135–145)
Total Bilirubin: 0.6 mg/dL (ref 0.3–1.2)
Total Protein: 5.9 g/dL — ABNORMAL LOW (ref 6.5–8.1)

## 2019-07-29 LAB — CBC WITH DIFFERENTIAL/PLATELET
Abs Immature Granulocytes: 0.66 10*3/uL — ABNORMAL HIGH (ref 0.00–0.07)
Basophils Absolute: 0 10*3/uL (ref 0.0–0.1)
Basophils Relative: 0 %
Eosinophils Absolute: 0 10*3/uL (ref 0.0–0.5)
Eosinophils Relative: 0 %
HCT: 34.6 % — ABNORMAL LOW (ref 36.0–46.0)
Hemoglobin: 11 g/dL — ABNORMAL LOW (ref 12.0–15.0)
Immature Granulocytes: 3 %
Lymphocytes Relative: 6 %
Lymphs Abs: 1.4 10*3/uL (ref 0.7–4.0)
MCH: 31.2 pg (ref 26.0–34.0)
MCHC: 31.8 g/dL (ref 30.0–36.0)
MCV: 98 fL (ref 80.0–100.0)
Monocytes Absolute: 1.3 10*3/uL — ABNORMAL HIGH (ref 0.1–1.0)
Monocytes Relative: 5 %
Neutro Abs: 21.3 10*3/uL — ABNORMAL HIGH (ref 1.7–7.7)
Neutrophils Relative %: 86 %
Platelets: 433 10*3/uL — ABNORMAL HIGH (ref 150–400)
RBC: 3.53 MIL/uL — ABNORMAL LOW (ref 3.87–5.11)
RDW: 16.2 % — ABNORMAL HIGH (ref 11.5–15.5)
WBC: 24.7 10*3/uL — ABNORMAL HIGH (ref 4.0–10.5)
nRBC: 0 % (ref 0.0–0.2)

## 2019-07-29 LAB — LACTIC ACID, PLASMA
Lactic Acid, Venous: 1.3 mmol/L (ref 0.5–1.9)
Lactic Acid, Venous: 1.2 mmol/L (ref 0.5–1.9)

## 2019-07-29 LAB — PROTIME-INR
INR: 1.2 (ref 0.8–1.2)
Prothrombin Time: 14.5 seconds (ref 11.4–15.2)

## 2019-07-29 LAB — URINALYSIS, ROUTINE W REFLEX MICROSCOPIC
Bilirubin Urine: NEGATIVE
Glucose, UA: NEGATIVE mg/dL
Hgb urine dipstick: NEGATIVE
Ketones, ur: NEGATIVE mg/dL
Leukocytes,Ua: NEGATIVE
Nitrite: NEGATIVE
Protein, ur: NEGATIVE mg/dL
Specific Gravity, Urine: 1.02 (ref 1.005–1.030)
pH: 8 (ref 5.0–8.0)

## 2019-07-29 MED ORDER — IOHEXOL 300 MG/ML  SOLN
100.0000 mL | Freq: Once | INTRAMUSCULAR | Status: AC | PRN
  Administered 2019-07-29: 100 mL via INTRAVENOUS

## 2019-07-29 MED ORDER — SODIUM CHLORIDE 0.9 % IV SOLN: Freq: Once | INTRAVENOUS | Status: AC

## 2019-07-29 MED ORDER — SODIUM CHLORIDE 0.9 % IV SOLN
2.0000 g | Freq: Once | INTRAVENOUS | Status: AC
  Administered 2019-07-29: 2 g via INTRAVENOUS
  Filled 2019-07-29: qty 20

## 2019-07-29 MED ORDER — ACETAMINOPHEN 500 MG PO TABS
1000.0000 mg | ORAL_TABLET | Freq: Once | ORAL | Status: AC
  Administered 2019-07-29: 1000 mg via ORAL
  Filled 2019-07-29: qty 2

## 2019-07-29 NOTE — ED Notes (Signed)
Report given to [place] RM [place]de. Secretary to call [facility] for transport

## 2019-07-29 NOTE — ED Provider Notes (Signed)
[facility]ERG[facility]Y DEPARTMENT Provider Note   CSN: [id number] Arrival date & time: 07/29/19  1841     History Chief Complaint  Patient presents with  . Weakness  . Shortness of Breath    [name] is a 85 y.o. female.  Pt presents to the ED today with weakness.  Pt was admitted from 5/27 to 6/2 with sepsis from a UTI.  She received rocephin IV while here.  She went to her PCP today for a follow up and had some blood drawn.  WBC was elevated, so she was told to come to the ED.  Pt said she's been feeling weak since d/c.  She has had some abdominal pain as well.  No n/v.  No f/c.         Past Medical History:  Diagnosis Date  . Allergy    SEASONAL  . Arthritis   . Asthma    "slight"   . Baker's cyst of knee, right   . CAD (coronary artery disease)    a. Myoview 10/15 - normal EF 70% // Myoview 11/16: EF 75%, normal perfusion, (there was no blood pressure drop at low level exercise with Lexiscan infusion), low risk study // c. LHC 3/17 - LAD irregs, oLCx 70 (neg FFR), mRCA 30, EF 55-65% >> med Rx // Myoview 07/2018:  EF 73, extracardiac uptake, no significant ischemia (reviewed with Dr. Burt Knack), Low Risk   . Carotid stenosis    a. Carotid US 9/15 - Bilateral 1-39% ICA >> FU 2 years // b. Bilateral ICA 1-39 >> FU prn // Carotid US 06/2018: bilat 1-39; fu prn  . Dyspnea    due to Sarcoidosis  . Elbow fracture 04/2017   Right, had surgery  . Esophageal reflux   . Hematochezia   . History of echocardiogram    a. Echo 10/15 - EF 60-65%, no RWMA  . HLD (hyperlipidemia)   . Hx of colonoscopy   . Osteoporosis   . Other chronic pulmonary heart diseases   . Paroxysmal supraventricular tachycardia ([place])   . Pneumonia   . Sarcoidosis     Patient Active Problem List   Diagnosis Date Noted  . Sepsis due to urinary tract infection ([place]) 07/18/2019  . S/P reverse total shoulder arthroplasty, 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  . Pre-op evaluation 06/11/2019  . Low back  pain 03/13/2019  . Fracture of inferior pubic ramus ([place]) 02/28/2019  . Degeneration of lumbar intervertebral disc 02/14/2019  . Sacral insufficiency fracture 12/12/2018  . Hip fracture ([place]) 12/12/2018  . ETD (Eustachian tube dysfunction), bilateral 11/07/2017  . Chronic nonintractable headache 11/07/2017  . Aftercare 06/06/2017  . Closed fracture of right olecranon process 05/09/2017  . Pain in joint of right elbow 05/02/2017  . Rib lesion 04/20/2017  . Bronchiectasis without complication ([place]) 24/40/1027  . Allergic rhinitis 09/15/2015  . CAD (coronary artery disease) 05/18/2015  . Essential hypertension 05/18/2015  . Carotid artery disease ([place]) 05/18/2015  . Cough 03/19/2015  . Left foot pain 09/02/2014  . DOE (dyspnea on exertion) 12/26/2013  . Abnormal gait 11/06/2013  . Scoliosis (and kyphoscoliosis), idiopathic 07/30/2013  . 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 Constipation 09/05/2012  . History of sinus tachycardia 09/05/2012  . Closed left subtrochanteric femur fracture S/P Open/closed and reduction, internal medullary fixation  09/05/2012  . Acute posthemorrhagic anemia 08/22/2012  . Lumbar radiculopathy 11/23/2011  . Leg weakness 11/23/2011  . Mitral regurgitation 11/22/2011  . DIARRHEA-PRESUMED INFECTIOUS 08/18/2008  .  RECTAL BLEEDING 08/18/2008  . PERSONAL HX COLONIC POLYPS 08/18/2008  . DEGENERATIVE JOINT DISEASE 08/14/2008  . SKIN CANCER, HX OF 08/14/2008  . CARPAL TUNNEL SYNDROME, HX OF 08/14/2008  . SARCOIDOSIS, PULMONARY 08/08/2008  . HLD (hyperlipidemia) 08/08/2008  . Paroxysmal supraventricular tachycardia ([place]) 08/08/2008  . GERD 08/08/2008    Past Surgical History:  Procedure Laterality Date  . arm surgery Right   . BLADDER SURGERY    . CARDIAC CATHETERIZATION N/A 05/20/2015   Procedure: Left Heart Cath and Coronary Angiography;  Surgeon: Sherren Mocha, MD;  Location: [facility];  Service: Cardiovascular;  Laterality: N/A;  .  CARPAL TUNNEL RELEASE Left   . COLONOSCOPY    . FOOT SURGERY Right   . HIP SURGERY Right 2011   full replacement  . LEG SURGERY Left May 2014   femur fracture s/p open and closed reduction in [place], Dr. Jimmye Norman  . ORIF ELBOW FRACTURE Right 05/09/2017   Procedure: ORIF right olecranon fracture with repair/reconstruction, ulnar nerve transposition as needed;  Surgeon: Roseanne Kaufman, MD;  Location: [facility];  Service: Orthopedics;  Laterality: Right;  Requests 90 mins  . pelvis fracture    . POLYPECTOMY    . REVERSE SHOULDER ARTHROPLASTY Left 06/13/2019   Procedure: REVERSE SHOULDER ARTHROPLASTY;  Surgeon: Justice Britain, MD;  Location: [facility];  Service: Orthopedics;  Laterality: Left;  131min  . SHOULDER ARTHROSCOPY W/ ROTATOR CUFF REPAIR Right   . TRAPEZIUM RESECTION Right      OB History    Gravida  1   Para  1   Term  1   Preterm  0   AB  0   Living  1     SAB  0   TAB  0   Ectopic  0   Multiple  0   Live Births  1           Family History  Problem Relation Age of Onset  . Colon cancer Mother 52  . Cancer Mother   . Lung cancer Father   . Heart disease Father   . Arrhythmia Father   . Cancer Father   . Cancer Sister        ovarian  . Heart attack Brother   . Esophageal cancer Neg Hx   . Rectal cancer Neg Hx   . Stomach cancer Neg Hx   . Stroke Neg Hx     Social History   Tobacco Use  . Smoking status: Never Smoker  . Smokeless tobacco: Never Used  Substance Use Topics  . Alcohol use: No  . Drug use: No    Home Medications Prior to Admission medications   Medication Sig Start Date End Date Taking? Authorizing Provider  acetaminophen (TYLENOL) 500 MG tablet Take 1,000 mg by mouth every 6 (six) hours as needed for mild pain.    Yes [provider]  albuterol (VENTOLIN HFA) 108 (90 Base) MCG/ACT inhaler Inhale 2 puffs into the lungs every 4 (four) hours as needed for wheezing or shortness of breath. 01/07/19  Yes Martyn Ehrich, NP   aspirin EC 81 MG tablet Take 1 tablet (81 mg total) by mouth daily. 12/23/14  Yes Weaver, [name] T, PA-C  calcium carbonate (TUMS - DOSED IN MG ELEMENTAL CALCIUM) 500 MG chewable tablet Chew 1 tablet by mouth daily as needed for indigestion.    Yes [provider]  diltiazem (CARDIZEM CD) 180 MG 24 hr capsule Take 180 mg by mouth at bedtime.  05/08/19  Yes [provider]  docusate sodium (COLACE) 50 MG capsule Take 100 mg by mouth daily.   Yes [provider]  fluticasone (FLONASE) 50 MCG/ACT nasal spray Place 2 sprays into both nostrils 2 (two) times daily. 05/31/19  Yes Lauraine Rinne, NP  furosemide (LASIX) 20 MG tablet Take 1 tablet (20 mg total) by mouth daily. 07/24/19  Yes Regalado, Belkys A, MD  ipratropium (ATROVENT HFA) 17 MCG/ACT inhaler Inhale 1 puff into the lungs 2 (two) times daily. 07/24/19 07/23/20 Yes Regalado, Belkys A, MD  ipratropium (ATROVENT) 0.03 % nasal spray Place 2 sprays into both nostrils 4 (four) times daily as needed for rhinitis. 05/31/19  Yes Lauraine Rinne, NP  melatonin 5 MG TABS Take 5 mg by mouth at bedtime as needed (Sleep).   Yes [provider]  mometasone-formoterol (DULERA) 200-5 MCG/ACT AERO Inhale 2 puffs into the lungs 2 (two) times daily. 07/24/19  Yes Regalado, Belkys A, MD  omeprazole (PRILOSEC) 40 MG capsule Take 40 mg by mouth 2 (two) times daily.  11/03/13  Yes [provider]  ondansetron (ZOFRAN) 4 MG tablet Take 1 tablet (4 mg total) by mouth every 8 (eight) hours as needed for nausea or vomiting (take along with pain med). 06/14/19  Yes Shuford, Olivia Mackie, PA-C  oxyCODONE-acetaminophen (PERCOCET) 5-325 MG tablet Take 1 tablet by mouth every 4 (four) hours as needed (max 6 q for post op pain). 06/14/19  Yes Shuford, Olivia Mackie, PA-C  polyethylene glycol (MIRALAX / GLYCOLAX) 17 g packet Take 17 g by mouth daily.   Yes [provider]  potassium chloride (KLOR-CON) 10 MEQ tablet Take 1 tablet (10 mEq total) by mouth daily.  07/24/19  Yes Regalado, Belkys A, MD  predniSONE (DELTASONE) 10 MG tablet Take 3 tablets (30 mg total) by mouth daily with breakfast for 4 days. 07/25/19 07/29/19 Yes Regalado, Belkys A, MD  rosuvastatin (CRESTOR) 10 MG tablet Take 10 mg by mouth every Monday, Wednesday, and Friday.    Yes [provider]  saccharomyces boulardii (FLORASTOR) 250 MG capsule Take 1 capsule (250 mg total) by mouth 2 (two) times daily. 07/24/19  Yes Regalado, Belkys A, MD  Vitamin D, Ergocalciferol, (DRISDOL) 50000 units CAPS capsule Take 50,000 Units by mouth every Friday.    Yes [provider]  denosumab (PROLIA) 60 MG/ML SOLN injection Inject 60 mg into the skin every 6 (six) months. Administer in upper arm, thigh, or abdomen    [provider]  guaiFENesin (MUCINEX) 600 MG 12 hr tablet Take 1 tablet (600 mg total) by mouth 2 (two) times daily. Patient not taking: Reported on 07/29/2019 07/24/19   Regalado, Jerald Kief A, MD  nitroGLYCERIN (NITROSTAT) 0.4 MG SL tablet Place 1 tablet (0.4 mg total) under the tongue every 5 (five) minutes as needed for chest pain. 07/11/17   [name] T, PA-C  Respiratory Therapy Supplies (FLUTTER) DEVI Use as directed Patient not taking: Reported on 07/18/2019 01/07/19   Martyn Ehrich, NP  senna-docusate (SENOKOT-S) 8.6-50 MG tablet Take 1 tablet by mouth at bedtime. Patient not taking: Reported on 07/18/2019 12/15/18   Nita Sells, MD    Allergies    Levaquin [levofloxacin], Sulfonamide derivatives, Oxycodone, and Morphine  Review of Systems   Review of Systems  Gastrointestinal: Positive for abdominal pain.  Neurological: Positive for weakness.  All other systems reviewed and are negative.   Physical Exam Updated Vital Signs BP (!) 156/63   Pulse 87   Temp 99.4  F (37.4 C) (Oral)   Resp (!) 23   Ht 5\' 3"  (1.6 m)   Wt 56.7 kg   LMP 02/22/1983 (Approximate)   SpO2 95%   BMI 22.14 kg/m   Physical Exam Vitals and nursing note reviewed.    Constitutional:      Appearance: She is well-developed.  HENT:     Head: Normocephalic and atraumatic.     Mouth/Throat:     Mouth: Mucous membranes are moist.     Pharynx: Oropharynx is clear.  Eyes:     Extraocular Movements: Extraocular movements intact.     Pupils: Pupils are equal, round, and reactive to light.  Cardiovascular:     Rate and Rhythm: Normal rate and regular rhythm.  Pulmonary:     Effort: Pulmonary effort is normal.     Breath sounds: Normal breath sounds.  Abdominal:     General: Bowel sounds are normal.     Palpations: Abdomen is soft.  Musculoskeletal:        General: Normal range of motion.     Cervical back: Normal range of motion and neck supple.  Skin:    General: Skin is warm.     Capillary Refill: Capillary refill takes less than 2 seconds.  Neurological:     General: No focal deficit present.     Mental Status: She is alert and oriented to person, place, and time.  Psychiatric:        Mood and Affect: Mood normal.        Behavior: Behavior normal.     ED Results / Procedures / Treatments   Labs (all labs ordered are listed, but only abnormal results are displayed) Labs Reviewed  COMPREHENSIVE METABOLIC PANEL - Abnormal; Notable for the following components:      Result Value   Sodium 132 (*)    Chloride 95 (*)    Glucose, Bld 138 (*)    Calcium 8.4 (*)    Total Protein 5.9 (*)    Albumin 2.9 (*)    GFR calc non Af Amer 58 (*)    All other components within normal limits  CBC WITH DIFFERENTIAL/PLATELET - Abnormal; Notable for the following components:   WBC 24.7 (*)    RBC 3.53 (*)    Hemoglobin 11.0 (*)    HCT 34.6 (*)    RDW 16.2 (*)    Platelets 433 (*)    Neutro Abs 21.3 (*)    Monocytes Absolute 1.3 (*)    Abs Immature Granulocytes 0.66 (*)    All other components within normal limits  CULTURE, BLOOD (ROUTINE X 2)  CULTURE, BLOOD (ROUTINE X 2)  URINE CULTURE  SARS CORONAVIRUS 2 BY RT PCR (HOSPITAL ORDER, [facility])  LACTIC ACID, PLASMA  LACTIC ACID, PLASMA  PROTIME-INR  URINALYSIS, ROUTINE W REFLEX MICROSCOPIC    EKG None  Radiology DG Chest 2 View  Result Date: 07/29/2019 CLINICAL DATA:  Weakness, short of breath for 1 day, sepsis EXAM: CHEST - 2 VIEW COMPARISON:  07/20/2019 FINDINGS: Frontal and lateral views of the chest demonstrate a stable cardiac silhouette. Lungs are hyperinflated with background emphysema. There are trace bilateral pleural effusions. Central vascular congestion is chronic. No acute airspace disease or pneumothorax. IMPRESSION: 1. Small bilateral pleural effusions, unchanged since previous exam. 2. Chronic central vascular congestion likely an element of pulmonary arterial hypertension given background emphysema. 3. No acute airspace disease. Electronically Signed   By: Diana Eves.D.  On: 07/29/2019 19:38   CT ABDOMEN PELVIS W CONTRAST  Result Date: 07/29/2019 CLINICAL DATA:  Weakness and abdominal pain. EXAM: CT ABDOMEN AND PELVIS WITH CONTRAST TECHNIQUE: Multidetector CT imaging of the abdomen and pelvis was performed using the standard protocol following bolus administration of intravenous contrast. CONTRAST:  166mL OMNIPAQUE IOHEXOL 300 MG/ML  SOLN COMPARISON:  May 19, 2003 FINDINGS: Lower chest: Mild areas of scarring and/or atelectasis are seen in the posterior aspect of the bilateral lung bases. There is a small left pleural effusion. Multiple chronic posterior right-sided rib fractures are seen. Hepatobiliary: A 1.7 cm diameter cystic appearing areas seen within the anteromedial aspect of the right lobe of the liver. 3 mm foci of low attenuation are seen within the posterior aspect of the liver dome. No gallstones, gallbladder wall thickening, or biliary dilatation. Pancreas: Unremarkable. No pancreatic ductal dilatation or surrounding inflammatory changes. Spleen: Normal in size without focal abnormality. Adrenals/Urinary Tract: Adrenal glands  are unremarkable. Kidneys are normal in size. A 3.1 cm x 2.4 cm simple cyst is seen within the posterior aspect of the mid right kidney. A 7 mm obstructing renal stone is seen within the proximal left ureter. There is moderate severity left-sided hydronephrosis and hydroureter. Delayed renal cortical enhancement is seen on the left. Mild left perinephric inflammatory fat stranding is also noted. The urinary bladder is limited in evaluation secondary to overlying streak artifact. Stomach/Bowel: There is a small hiatal hernia. Appendix appears normal. No evidence of bowel wall thickening, distention, or inflammatory changes. Vascular/Lymphatic: There is moderate severity calcification of the abdominal aorta. No enlarged abdominal or pelvic lymph nodes. Reproductive: The expected region of the uterus and bilateral adnexa is limited in evaluation secondary to overlying streak artifact. Other: No abdominal wall hernia or abnormality. No abdominopelvic ascites. Musculoskeletal: There is a total right hip replacement with associated streak artifact. And intramedullary rod and compression screw device are seen within the proximal left femur. Chronic bilateral inferior pubic ramus fractures are seen. Multilevel degenerative changes seen throughout the lumbar spine. IMPRESSION: 1. 7 mm obstructing renal stone within the proximal left ureter with moderate severity left-sided hydronephrosis and hydroureter. 2. Small left pleural effusion. 3. Small hiatal hernia. 4. Total right hip replacement. 5. Prior open reduction and internal fixation of the proximal left femur. Aortic Atherosclerosis (ICD10-I70.0). Electronically Signed   By: Virgina Norfolk M.D.   On: 07/29/2019 21:39    Procedures Procedures (including critical care time)  Medications Ordered in ED Medications  cefTRIAXone (ROCEPHIN) 2 g in sodium chloride 0.9 % 100 mL IVPB (2 g Intravenous New Bag/Given 07/29/19 2241)  0.9 %  sodium chloride infusion (  Intravenous New Bag/Given 07/29/19 2024)  iohexol (OMNIPAQUE) 300 MG/ML solution 100 mL (100 mLs Intravenous Contrast Given 07/29/19 2122)  acetaminophen (TYLENOL) tablet 1,000 mg (1,000 mg Oral Given 07/29/19 2238)    ED Course  I have reviewed the triage vital signs and the nursing notes.  Pertinent labs & imaging results that were available during my care of the patient were reviewed by me and considered in my medical decision making (see chart for details).    MDM Rules/Calculators/A&P                      Pt d/w Dr. Jeffie Pollock (urology) who recommended admission to [facility] by the hospitalist for a stent placement.  NPO after midnight.  Pt given 2g rocephin IV in ED.  Lactic is nl.  Blood and urine cultures are pending.  She does not appear to be septic now.  Pt d/w Dr. Hal Hope ([facility]) for admission.  Final Clinical Impression(s) / ED Diagnoses Final diagnoses:  Hydronephrosis with urinary obstruction due to ureteral calculus    Rx / DC Orders ED Discharge Orders    None       Isla Pence, MD 07/29/19 2310

## 2019-07-29 NOTE — ED Notes (Signed)
Ursula Beath, daughter, can be reached at [phone]. Would like update in regards to tomorrows plans

## 2019-07-29 NOTE — ED Triage Notes (Signed)
Pt BIB [facility] for eval of shortness of breath, DOE and weakness onset this AM. Pt was recently hospitalized for a week for urosepsis, d/cd last Wednesday. Went to PCP this AM and was called and informed that her white count was high. Concern for infection.

## 2019-07-30 ENCOUNTER — Other Ambulatory Visit: Payer: Self-pay

## 2019-07-30 ENCOUNTER — Inpatient Hospital Stay (HOSPITAL_COMMUNITY): Payer: Medicare Other

## 2019-07-30 ENCOUNTER — Inpatient Hospital Stay (HOSPITAL_COMMUNITY): Payer: Medicare Other | Admitting: Certified Registered Nurse Anesthetist

## 2019-07-30 ENCOUNTER — Encounter (HOSPITAL_COMMUNITY)
Admission: EM | Disposition: A | Discharge status: Home / Self Care | Payer: Self-pay | Source: Home / Self Care | Attending: Family Medicine

## 2019-07-30 ENCOUNTER — Encounter (HOSPITAL_COMMUNITY): Payer: Self-pay | Admitting: Internal Medicine

## 2019-07-30 DIAGNOSIS — N132 Hydronephrosis with renal and ureteral calculous obstruction: Secondary | ICD-10-CM

## 2019-07-30 DIAGNOSIS — A419 Sepsis, unspecified organism: Secondary | ICD-10-CM | POA: Diagnosis present

## 2019-07-30 DIAGNOSIS — N390 Urinary tract infection, site not specified: Secondary | ICD-10-CM

## 2019-07-30 DIAGNOSIS — I10 Essential (primary) hypertension: Secondary | ICD-10-CM

## 2019-07-30 HISTORY — DX: Sepsis, unspecified organism: A41.9

## 2019-07-30 HISTORY — PX: CYSTOSCOPY W/ RETROGRADES: SHX1426

## 2019-07-30 LAB — URINE CULTURE: Culture: <10000 — AB

## 2019-07-30 LAB — SARS CORONAVIRUS 2 BY RT PCR (HOSPITAL ORDER, PERFORMED IN ~~LOC~~ HOSPITAL LAB): SARS Coronavirus 2: POSITIVE — AB

## 2019-07-30 LAB — SURGICAL PCR SCREEN
MRSA, PCR: NEGATIVE
Staphylococcus aureus: NEGATIVE

## 2019-07-30 LAB — GLUCOSE, CAPILLARY
Glucose-Capillary: 82 mg/dL (ref 70–99)
Glucose-Capillary: 70 mg/dL (ref 70–99)
Glucose-Capillary: 325 mg/dL — ABNORMAL HIGH (ref 70–99)

## 2019-07-30 SURGERY — CYSTOSCOPY, WITH RETROGRADE PYELOGRAM
Anesthesia: General | Site: Ureter | Laterality: Left

## 2019-07-30 MED ORDER — CHLORHEXIDINE GLUCONATE CLOTH 2 % EX PADS
6.0000 | MEDICATED_PAD | Freq: Every day | CUTANEOUS | Status: DC
  Administered 2019-07-30 – 2019-08-01 (×3): 6 via TOPICAL

## 2019-07-30 MED ORDER — DEXAMETHASONE SODIUM PHOSPHATE 10 MG/ML IJ SOLN
INTRAMUSCULAR | Status: AC
  Filled 2019-07-30: qty 1

## 2019-07-30 MED ORDER — ONDANSETRON HCL 4 MG PO TABS: 4.0000 mg | ORAL_TABLET | Freq: Four times a day (QID) | ORAL | Status: DC | PRN

## 2019-07-30 MED ORDER — LIDOCAINE 2% (20 MG/ML) 5 ML SYRINGE
INTRAMUSCULAR | Status: AC
  Filled 2019-07-30: qty 5

## 2019-07-30 MED ORDER — DEXAMETHASONE SODIUM PHOSPHATE 10 MG/ML IJ SOLN
INTRAMUSCULAR | Status: DC | PRN
  Administered 2019-07-30: 8 mg via INTRAVENOUS

## 2019-07-30 MED ORDER — DILTIAZEM HCL ER COATED BEADS 180 MG PO CP24
180.0000 mg | ORAL_CAPSULE | Freq: Every day | ORAL | Status: DC
  Administered 2019-07-30 – 2019-07-31 (×3): 180 mg via ORAL
  Filled 2019-07-30 (×4): qty 1

## 2019-07-30 MED ORDER — PROPOFOL 10 MG/ML IV BOLUS
INTRAVENOUS | Status: DC | PRN
  Administered 2019-07-30: 90 mg via INTRAVENOUS
  Administered 2019-07-30: 30 mg via INTRAVENOUS

## 2019-07-30 MED ORDER — IOPAMIDOL (ISOVUE-300) INJECTION 61%
INTRAVENOUS | Status: DC | PRN
  Administered 2019-07-30: 4 mL

## 2019-07-30 MED ORDER — LACTATED RINGERS IV SOLN: INTRAVENOUS | Status: DC | PRN

## 2019-07-30 MED ORDER — ONDANSETRON HCL 4 MG/2ML IJ SOLN: 4.0000 mg | Freq: Four times a day (QID) | INTRAMUSCULAR | Status: DC | PRN

## 2019-07-30 MED ORDER — FENTANYL CITRATE (PF) 100 MCG/2ML IJ SOLN
INTRAMUSCULAR | Status: AC
  Filled 2019-07-30: qty 2

## 2019-07-30 MED ORDER — FENTANYL CITRATE (PF) 100 MCG/2ML IJ SOLN
INTRAMUSCULAR | Status: DC | PRN
  Administered 2019-07-30 (×2): 50 ug via INTRAVENOUS

## 2019-07-30 MED ORDER — BELLADONNA ALKALOIDS-OPIUM 16.2-30 MG RE SUPP
RECTAL | Status: AC
  Filled 2019-07-30: qty 1

## 2019-07-30 MED ORDER — SODIUM CHLORIDE 0.9 % IV SOLN: INTRAVENOUS | Status: DC

## 2019-07-30 MED ORDER — LIDOCAINE 2% (20 MG/ML) 5 ML SYRINGE
INTRAMUSCULAR | Status: DC | PRN
  Administered 2019-07-30: 80 mg via INTRAVENOUS

## 2019-07-30 MED ORDER — ESMOLOL HCL 100 MG/10ML IV SOLN
INTRAVENOUS | Status: AC
  Filled 2019-07-30: qty 10

## 2019-07-30 MED ORDER — PROPOFOL 10 MG/ML IV BOLUS
INTRAVENOUS | Status: AC
  Filled 2019-07-30: qty 20

## 2019-07-30 MED ORDER — ACETAMINOPHEN 325 MG PO TABS
650.0000 mg | ORAL_TABLET | Freq: Four times a day (QID) | ORAL | Status: DC | PRN
  Administered 2019-07-30 – 2019-07-31 (×3): 650 mg via ORAL
  Filled 2019-07-30 (×3): qty 2

## 2019-07-30 MED ORDER — ALBUTEROL SULFATE (2.5 MG/3ML) 0.083% IN NEBU: 2.5000 mg | INHALATION_SOLUTION | RESPIRATORY_TRACT | Status: DC | PRN

## 2019-07-30 MED ORDER — SUCCINYLCHOLINE CHLORIDE 200 MG/10ML IV SOSY
PREFILLED_SYRINGE | INTRAVENOUS | Status: DC | PRN
  Administered 2019-07-30: 100 mg via INTRAVENOUS

## 2019-07-30 MED ORDER — ONDANSETRON HCL 4 MG/2ML IJ SOLN
INTRAMUSCULAR | Status: AC
  Filled 2019-07-30: qty 2

## 2019-07-30 MED ORDER — MELATONIN 5 MG PO TABS
5.0000 mg | ORAL_TABLET | Freq: Every day | ORAL | Status: DC
  Administered 2019-07-30 – 2019-07-31 (×2): 5 mg via ORAL
  Filled 2019-07-30 (×2): qty 1

## 2019-07-30 MED ORDER — SODIUM CHLORIDE 0.9 % IR SOLN
Status: DC | PRN
  Administered 2019-07-30: 3000 mL

## 2019-07-30 MED ORDER — PHENYLEPHRINE 40 MCG/ML (10ML) SYRINGE FOR IV PUSH (FOR BLOOD PRESSURE SUPPORT)
PREFILLED_SYRINGE | INTRAVENOUS | Status: DC | PRN
  Administered 2019-07-30: 160 ug via INTRAVENOUS
  Administered 2019-07-30 (×2): 120 ug via INTRAVENOUS

## 2019-07-30 MED ORDER — ONDANSETRON HCL 4 MG/2ML IJ SOLN
INTRAMUSCULAR | Status: DC | PRN
  Administered 2019-07-30: 4 mg via INTRAVENOUS

## 2019-07-30 MED ORDER — ESMOLOL HCL 100 MG/10ML IV SOLN
INTRAVENOUS | Status: DC | PRN
Start: 2019-07-30 — End: 2019-07-30
  Administered 2019-07-30 (×2): 15 mg via INTRAVENOUS

## 2019-07-30 MED ORDER — MOMETASONE FURO-FORMOTEROL FUM 200-5 MCG/ACT IN AERO
2.0000 | INHALATION_SPRAY | Freq: Two times a day (BID) | RESPIRATORY_TRACT | Status: DC
  Administered 2019-07-30 – 2019-08-01 (×4): 2 via RESPIRATORY_TRACT
  Filled 2019-07-30: qty 8.8

## 2019-07-30 MED ORDER — BELLADONNA ALKALOIDS-OPIUM 16.2-60 MG RE SUPP
RECTAL | Status: DC | PRN
  Administered 2019-07-30: 1 via RECTAL

## 2019-07-30 MED ORDER — SODIUM CHLORIDE 0.9 % IV SOLN
1.0000 g | INTRAVENOUS | Status: DC
  Administered 2019-07-30 – 2019-07-31 (×2): 1 g via INTRAVENOUS
  Filled 2019-07-30 (×2): qty 1

## 2019-07-30 MED ORDER — ALBUTEROL SULFATE HFA 108 (90 BASE) MCG/ACT IN AERS: 2.0000 | INHALATION_SPRAY | RESPIRATORY_TRACT | Status: DC | PRN

## 2019-07-30 SURGICAL SUPPLY — 18 items
BAG URO CATCHER STRL LF (MISCELLANEOUS) ×2 IMPLANT
CATH URET 5FR 28IN OPEN ENDED (CATHETERS) ×2 IMPLANT
CLOTH BEACON ORANGE TIMEOUT ST (SAFETY) ×2 IMPLANT
COVER WAND RF STERILE (DRAPES) IMPLANT
FIBER LASER FLEXIVA 1000 (UROLOGICAL SUPPLIES) IMPLANT
FIBER LASER FLEXIVA 365 (UROLOGICAL SUPPLIES) IMPLANT
FIBER LASER FLEXIVA 550 (UROLOGICAL SUPPLIES) IMPLANT
FIBER LASER TRAC TIP (UROLOGICAL SUPPLIES) IMPLANT
GLOVE BIO SURGEON STRL SZ7.5 (GLOVE) ×2 IMPLANT
GOWN STRL REUS W/TWL LRG LVL3 (GOWN DISPOSABLE) ×2 IMPLANT
GUIDEWIRE STR DUAL SENSOR (WIRE) ×2 IMPLANT
KIT TURNOVER KIT A (KITS) IMPLANT
MANIFOLD NEPTUNE II (INSTRUMENTS) ×2 IMPLANT
NS IRRIG 1000ML POUR BTL (IV SOLUTION) IMPLANT
PENCIL SMOKE EVACUATOR (MISCELLANEOUS) IMPLANT
STENT URET 6FRX24 CONTOUR (STENTS) ×1 IMPLANT
TRAY CYSTO PACK (CUSTOM PROCEDURE TRAY) ×2 IMPLANT
TUBING CONNECTING 10 (TUBING) ×2 IMPLANT

## 2019-07-30 NOTE — Discharge Instructions (Signed)
DISCHARGE INSTRUCTIONS FOR KIDNEY STONE/URETERAL STENT   MEDICATIONS:  1.  Resume all your other meds from home - except do not take any extra narcotic pain meds that you may have at home.    ACTIVITY:  1. No strenuous activity x 1week  2. No driving while on narcotic pain medications  3. Drink plenty of water  4. Continue to walk at home - you can still get blood clots when you are at home, so keep active, but don't over do it.  5. May return to work/school tomorrow or when you feel ready   BATHING:  1. You can shower and we recommend daily showers   SIGNS/SYMPTOMS TO CALL:  Please call us if you have a fever greater than 101.5, uncontrolled nausea/vomiting, uncontrolled pain, dizziness, unable to urinate, bloody urine, chest pain, shortness of breath, leg swelling, leg pain, redness around wound, drainage from wound, or any other concerns or questions.   You can reach [place] at [phone].   FOLLOW-UP:  1. You will be scheduled for f/u w/ Dr. Claudia Desanctis for removal of the stones once the infection has been adequately treated.

## 2019-07-30 NOTE — Transfer of Care (Addendum)
Immediate Anesthesia Transfer of Care Note  Patient: [name]  Procedure(s) Performed: CYSTOSCOPY WITH RETROGRADE PYELOGRAM LEFT STENT PLACEMENT (Left Ureter)  Patient Location: PACU  Anesthesia Type:General  Level of Consciousness: awake, oriented, patient cooperative and responds to stimulation  Airway & Oxygen Therapy: Patient Spontanous Breathing and Patient connected to face mask oxygen  Post-op Assessment: Report given to RN and Post -op Vital signs reviewed and stable  Post vital signs: Reviewed and stable  Last Vitals:  Vitals Value Taken Time  BP 129/55   Temp 36.6   Pulse 74   Resp 17   SpO2 99%     Last Pain:  Vitals:   07/30/19 1309  TempSrc: Oral  PainSc:          Complications: No apparent anesthesia complications

## 2019-07-30 NOTE — Op Note (Signed)
The patient underwent a left ureteral stent.  After the stent was placed pre went turbid urine was effluxing from the stent.  A urine culture from the bladder, with this purulence stuff was sent for urine culture.  A Foley catheter was placed.  She tolerated the procedure well.  The patient should be placed on culture specific antibiotics for 2 weeks.  Dr. Claudia Desanctis will schedule her for the operating room in the near future, someone from our office will contact her with a surgery scheduled date.    We will follow this patient on an as-needed basis moving forward.  Please contact us if there is any additional questions or concerns.

## 2019-07-30 NOTE — H&P (Signed)
History and Physical    [name] MLY:[id number] DOB: [birth year] DOA: 07/29/2019  PCP: [name], MD  Patient coming from: Home.  Chief Complaint: Abdominal pain abnormal labs.  HPI: [name] is a 85 y.o. female with with history of CAD sarcoidosis, PSVT recent left shoulder arthroplasty on June 13, 2019, CHF, pulmonary hypertension recently discharged on July 24, 2019 about 6 days ago after being admitted for sepsis secondary to E. coli bacteremia from UTI has not been feeling well since discharge and has been having abdominal discomfort more on the left side and at the time of patient follow-up with primary care physician patient had labs done which showed leukocytosis and given the symptoms patient was advised to come to the ER.  Denies any vomiting diarrhea chest pain or shortness of breath.  ED Course: In the ER patient was not hypoxic temperature was around 99.4 F WBC count of 24.7 sodium 132.  Hemoglobin was stable at 11.  EKG shows normal sinus rhythm with LVH.  CT abdomen pelvis shows left-sided hydronephrosis with proximal ureteral stone for which Dr. Jeffie Pollock urologist was consulted and patient is being transferred to [facility] for possible stent placement.  Patient was started on antibiotics.  Patient's Covid test came back positive but patient is asymptomatic from this.  Chest x-ray shows chronic findings.  Review of Systems: As per HPI, rest all negative.   Past Medical History:  Diagnosis Date  . Allergy    SEASONAL  . Arthritis   . Asthma    "slight"   . Baker's cyst of knee, right   . CAD (coronary artery disease)    a. Myoview 10/15 - normal EF 70% // Myoview 11/16: EF 75%, normal perfusion, (there was no blood pressure drop at low level exercise with Lexiscan infusion), low risk study // c. LHC 3/17 - LAD irregs, oLCx 70 (neg FFR), mRCA 30, EF 55-65% >> med Rx // Myoview 07/2018:  EF 73, extracardiac uptake, no significant ischemia (reviewed  with Dr. Burt Knack), Low Risk   . Carotid stenosis    a. Carotid US 9/15 - Bilateral 1-39% ICA >> FU 2 years // b. Bilateral ICA 1-39 >> FU prn // Carotid US 06/2018: bilat 1-39; fu prn  . Dyspnea    due to Sarcoidosis  . Elbow fracture 04/2017   Right, had surgery  . Esophageal reflux   . Hematochezia   . History of echocardiogram    a. Echo 10/15 - EF 60-65%, no RWMA  . HLD (hyperlipidemia)   . Hx of colonoscopy   . Osteoporosis   . Other chronic pulmonary heart diseases   . Paroxysmal supraventricular tachycardia (Newborn)   . Pneumonia   . Sarcoidosis     Past Surgical History:  Procedure Laterality Date  . arm surgery Right   . BLADDER SURGERY    . CARDIAC CATHETERIZATION N/A 05/20/2015   Procedure: Left Heart Cath and Coronary Angiography;  Surgeon: Sherren Mocha, MD;  Location: [facility];  Service: Cardiovascular;  Laterality: N/A;  . CARPAL TUNNEL RELEASE Left   . COLONOSCOPY    . FOOT SURGERY Right   . HIP SURGERY Right 2011   full replacement  . LEG SURGERY Left May 2014   femur fracture s/p open and closed reduction in [place], Dr. Jimmye Norman  . ORIF ELBOW FRACTURE Right 05/09/2017   Procedure: ORIF right olecranon fracture with repair/reconstruction, ulnar nerve transposition as needed;  Surgeon: Roseanne Kaufman, MD;  Location: [facility]  OR;  Service: Orthopedics;  Laterality: Right;  Requests 90 mins  . pelvis fracture    . POLYPECTOMY    . REVERSE SHOULDER ARTHROPLASTY Left 06/13/2019   Procedure: REVERSE SHOULDER ARTHROPLASTY;  Surgeon: Justice Britain, MD;  Location: [facility];  Service: Orthopedics;  Laterality: Left;  143min  . SHOULDER ARTHROSCOPY W/ ROTATOR CUFF REPAIR Right   . TRAPEZIUM RESECTION Right      reports that she has never smoked. She has never used smokeless tobacco. She reports that she does not drink alcohol or use drugs.  Allergies  Allergen Reactions  . Levaquin [Levofloxacin] Other (See Comments)    Leg pain .Marland Kitchen Per doctor not to take again  .  Sulfonamide Derivatives     Pt unsure  . Oxycodone Nausea Only    Can take oxycodone with Zofran  . Morphine Nausea Only    Family History  Problem Relation Age of Onset  . Colon cancer Mother 77  . Cancer Mother   . Lung cancer Father   . Heart disease Father   . Arrhythmia Father   . Cancer Father   . Cancer Sister        ovarian  . Heart attack Brother   . Esophageal cancer Neg Hx   . Rectal cancer Neg Hx   . Stomach cancer Neg Hx   . Stroke Neg Hx     Prior to Admission medications   Medication Sig Start Date End Date Taking? Authorizing Provider  acetaminophen (TYLENOL) 500 MG tablet Take 1,000 mg by mouth every 6 (six) hours as needed for mild pain.    Yes [provider]  albuterol (VENTOLIN HFA) 108 (90 Base) MCG/ACT inhaler Inhale 2 puffs into the lungs every 4 (four) hours as needed for wheezing or shortness of breath. 01/07/19  Yes Martyn Ehrich, NP  aspirin EC 81 MG tablet Take 1 tablet (81 mg total) by mouth daily. 12/23/14  Yes Weaver, [name] T, PA-C  calcium carbonate (TUMS - DOSED IN MG ELEMENTAL CALCIUM) 500 MG chewable tablet Chew 1 tablet by mouth daily as needed for indigestion.    Yes [provider]  diltiazem (CARDIZEM CD) 180 MG 24 hr capsule Take 180 mg by mouth at bedtime.  05/08/19  Yes [provider]  docusate sodium (COLACE) 50 MG capsule Take 100 mg by mouth daily.   Yes [provider]  fluticasone (FLONASE) 50 MCG/ACT nasal spray Place 2 sprays into both nostrils 2 (two) times daily. 05/31/19  Yes Lauraine Rinne, NP  furosemide (LASIX) 20 MG tablet Take 1 tablet (20 mg total) by mouth daily. 07/24/19  Yes Regalado, Belkys A, MD  ipratropium (ATROVENT HFA) 17 MCG/ACT inhaler Inhale 1 puff into the lungs 2 (two) times daily. 07/24/19 07/23/20 Yes Regalado, Belkys A, MD  ipratropium (ATROVENT) 0.03 % nasal spray Place 2 sprays into both nostrils 4 (four) times daily as needed for rhinitis. 05/31/19  Yes Lauraine Rinne, NP    melatonin 5 MG TABS Take 5 mg by mouth at bedtime as needed (Sleep).   Yes [provider]  mometasone-formoterol (DULERA) 200-5 MCG/ACT AERO Inhale 2 puffs into the lungs 2 (two) times daily. 07/24/19  Yes Regalado, Belkys A, MD  omeprazole (PRILOSEC) 40 MG capsule Take 40 mg by mouth 2 (two) times daily.  11/03/13  Yes [provider]  ondansetron (ZOFRAN) 4 MG tablet Take 1 tablet (4 mg total) by mouth every 8 (eight) hours as needed for nausea or  vomiting (take along with pain med). 06/14/19  Yes Shuford, Olivia Mackie, PA-C  oxyCODONE-acetaminophen (PERCOCET) 5-325 MG tablet Take 1 tablet by mouth every 4 (four) hours as needed (max 6 q for post op pain). 06/14/19  Yes Shuford, Olivia Mackie, PA-C  polyethylene glycol (MIRALAX / GLYCOLAX) 17 g packet Take 17 g by mouth daily.   Yes [provider]  potassium chloride (KLOR-CON) 10 MEQ tablet Take 1 tablet (10 mEq total) by mouth daily. 07/24/19  Yes Regalado, Belkys A, MD  rosuvastatin (CRESTOR) 10 MG tablet Take 10 mg by mouth every Monday, Wednesday, and Friday.    Yes [provider]  saccharomyces boulardii (FLORASTOR) 250 MG capsule Take 1 capsule (250 mg total) by mouth 2 (two) times daily. 07/24/19  Yes Regalado, Belkys A, MD  Vitamin D, Ergocalciferol, (DRISDOL) 50000 units CAPS capsule Take 50,000 Units by mouth every Friday.    Yes [provider]  denosumab (PROLIA) 60 MG/ML SOLN injection Inject 60 mg into the skin every 6 (six) months. Administer in upper arm, thigh, or abdomen    [provider]  guaiFENesin (MUCINEX) 600 MG 12 hr tablet Take 1 tablet (600 mg total) by mouth 2 (two) times daily. Patient not taking: Reported on 07/29/2019 07/24/19   Regalado, Jerald Kief A, MD  nitroGLYCERIN (NITROSTAT) 0.4 MG SL tablet Place 1 tablet (0.4 mg total) under the tongue every 5 (five) minutes as needed for chest pain. 07/11/17   [name] T, PA-C  Respiratory Therapy Supplies (FLUTTER) DEVI Use as  directed Patient not taking: Reported on 07/18/2019 01/07/19   Martyn Ehrich, NP  senna-docusate (SENOKOT-S) 8.6-50 MG tablet Take 1 tablet by mouth at bedtime. Patient not taking: Reported on 07/18/2019 12/15/18   Nita Sells, MD    Physical Exam: Constitutional: Moderately built and nourished. Vitals:   07/29/19 2300 07/29/19 2330 07/29/19 2344 07/30/19 0000  BP: (!) 142/55   (!) 129/52  Pulse: 86 87  78  Resp: (!) 21 (!) 27  (!) 21  Temp:   99.3 F (37.4 C)   TempSrc:   Oral   SpO2: 96% 95%  95%  Weight:      Height:       Eyes: Anicteric no pallor. ENMT: No discharge from the ears eyes nose or mouth. Neck: No mass felt.  No neck rigidity. Respiratory: No rhonchi or crepitations. Cardiovascular: S1-S2 heard. Abdomen: Soft nontender bowel sounds present. Musculoskeletal: No edema. Skin: No rash. Neurologic: Alert awake oriented time place and person.  Moves all extremities. Psychiatric: Appears normal.   Labs on Admission: I have personally reviewed following labs and imaging studies  CBC: Recent Labs  Lab 07/23/19 0357 07/29/19 1856  WBC 10.0 24.7*  NEUTROABS  --  21.3*  HGB 10.8* 11.0*  HCT 33.8* 34.6*  MCV 94.4 98.0  PLT 238 646*   Basic Metabolic Panel: Recent Labs  Lab 07/23/19 0357 07/24/19 0724 07/29/19 1856  NA 139 139 132*  K 4.3 4.2 4.0  CL 103 100 95*  CO2 26 26 22   GLUCOSE 110* 115* 138*  BUN 18 18 22   CREATININE 0.59 0.58 0.91  CALCIUM 8.5* 8.7* 8.4*  MG 2.1 1.9  --   PHOS 2.7 3.3  --    GFR: Estimated Creatinine Clearance: 38.7 mL/min (by C-G formula based on SCr of 0.91 mg/dL). Liver Function Tests: Recent Labs  Lab 07/23/19 0357 07/24/19 0724 07/29/19 1856  AST  --   --  17  ALT  --   --  25  ALKPHOS  --   --  106  BILITOT  --   --  0.6  PROT  --   --  5.9*  ALBUMIN 2.3* 2.4* 2.9*   No results for input(s): LIPASE, AMYLASE in the last 168 hours. No results for input(s): AMMONIA in the last 168  hours. Coagulation Profile: Recent Labs  Lab 07/29/19 1856  INR 1.2   Cardiac Enzymes: No results for input(s): CKTOTAL, CKMB, CKMBINDEX, TROPONINI in the last 168 hours. BNP (last 3 results) No results for input(s): PROBNP in the last 8760 hours. HbA1C: No results for input(s): HGBA1C in the last 72 hours. CBG: No results for input(s): GLUCAP in the last 168 hours. Lipid Profile: No results for input(s): CHOL, HDL, LDLCALC, TRIG, CHOLHDL, LDLDIRECT in the last 72 hours. Thyroid Function Tests: No results for input(s): TSH, T4TOTAL, FREET4, T3FREE, THYROIDAB in the last 72 hours. Anemia Panel: No results for input(s): VITAMINB12, FOLATE, FERRITIN, TIBC, IRON, RETICCTPCT in the last 72 hours. Urine analysis:    Component Value Date/Time   COLORURINE YELLOW 07/29/2019 2139   APPEARANCEUR CLEAR 07/29/2019 2139   LABSPEC 1.020 07/29/2019 2139   PHURINE 8.0 07/29/2019 2139   GLUCOSEU NEGATIVE 07/29/2019 2139   HGBUR NEGATIVE 07/29/2019 2139   BILIRUBINUR NEGATIVE 07/29/2019 2139   BILIRUBINUR negative 03/21/2018 1328   KETONESUR NEGATIVE 07/29/2019 2139   PROTEINUR NEGATIVE 07/29/2019 2139   UROBILINOGEN negative (A) 03/21/2018 1328   UROBILINOGEN 0.2 12/08/2008 1204   NITRITE NEGATIVE 07/29/2019 2139   LEUKOCYTESUR NEGATIVE 07/29/2019 2139   Sepsis Labs: @LABRCNTIP (procalcitonin:4,lacticidven:4) )No results found for this or any previous visit (from the past 240 hour(s)).   Radiological Exams on Admission: DG Chest 2 View  Result Date: 07/29/2019 CLINICAL DATA:  Weakness, short of breath for 1 day, sepsis EXAM: CHEST - 2 VIEW COMPARISON:  07/20/2019 FINDINGS: Frontal and lateral views of the chest demonstrate a stable cardiac silhouette. Lungs are hyperinflated with background emphysema. There are trace bilateral pleural effusions. Central vascular congestion is chronic. No acute airspace disease or pneumothorax. IMPRESSION: 1. Small bilateral pleural effusions, unchanged  since previous exam. 2. Chronic central vascular congestion likely an element of pulmonary arterial hypertension given background emphysema. 3. No acute airspace disease. Electronically Signed   By: Randa Ngo M.D.   On: 07/29/2019 19:38   CT ABDOMEN PELVIS W CONTRAST  Result Date: 07/29/2019 CLINICAL DATA:  Weakness and abdominal pain. EXAM: CT ABDOMEN AND PELVIS WITH CONTRAST TECHNIQUE: Multidetector CT imaging of the abdomen and pelvis was performed using the standard protocol following bolus administration of intravenous contrast. CONTRAST:  150mL OMNIPAQUE IOHEXOL 300 MG/ML  SOLN COMPARISON:  May 19, 2003 FINDINGS: Lower chest: Mild areas of scarring and/or atelectasis are seen in the posterior aspect of the bilateral lung bases. There is a small left pleural effusion. Multiple chronic posterior right-sided rib fractures are seen. Hepatobiliary: A 1.7 cm diameter cystic appearing areas seen within the anteromedial aspect of the right lobe of the liver. 3 mm foci of low attenuation are seen within the posterior aspect of the liver dome. No gallstones, gallbladder wall thickening, or biliary dilatation. Pancreas: Unremarkable. No pancreatic ductal dilatation or surrounding inflammatory changes. Spleen: Normal in size without focal abnormality. Adrenals/Urinary Tract: Adrenal glands are unremarkable. Kidneys are normal in size. A 3.1 cm x 2.4 cm simple cyst is seen within the posterior aspect of the mid right kidney. A 7 mm obstructing renal stone is seen within the proximal left ureter. There is moderate  severity left-sided hydronephrosis and hydroureter. Delayed renal cortical enhancement is seen on the left. Mild left perinephric inflammatory fat stranding is also noted. The urinary bladder is limited in evaluation secondary to overlying streak artifact. Stomach/Bowel: There is a small hiatal hernia. Appendix appears normal. No evidence of bowel wall thickening, distention, or inflammatory changes.  Vascular/Lymphatic: There is moderate severity calcification of the abdominal aorta. No enlarged abdominal or pelvic lymph nodes. Reproductive: The expected region of the uterus and bilateral adnexa is limited in evaluation secondary to overlying streak artifact. Other: No abdominal wall hernia or abnormality. No abdominopelvic ascites. Musculoskeletal: There is a total right hip replacement with associated streak artifact. And intramedullary rod and compression screw device are seen within the proximal left femur. Chronic bilateral inferior pubic ramus fractures are seen. Multilevel degenerative changes seen throughout the lumbar spine. IMPRESSION: 1. 7 mm obstructing renal stone within the proximal left ureter with moderate severity left-sided hydronephrosis and hydroureter. 2. Small left pleural effusion. 3. Small hiatal hernia. 4. Total right hip replacement. 5. Prior open reduction and internal fixation of the proximal left femur. Aortic Atherosclerosis (ICD10-I70.0). Electronically Signed   By: Virgina Norfolk M.D.   On: 07/29/2019 21:39    EKG: Independently reviewed.  Normal sinus rhythm LVH.  Assessment/Plan Principal Problem:   Sepsis due to urinary tract infection (HCC) Active Problems:   SARCOIDOSIS, PULMONARY   Paroxysmal supraventricular tachycardia (HCC)   CAD (coronary artery disease)   Essential hypertension   Pyelonephritis   Hydronephrosis   Sepsis ([place])    1. Possible developing sepsis secondary to urine tract infection with left-sided hydronephrosis with proximal ureteral stone for which urologist Dr. Jeffie Pollock has been consulted and plan is to place stent at [facility] which patient has been transferred.  Patient agreeable to transfer.  Patient be kept n.p.o. 2. COVID-19 test positive.  Patient at this time appears to be asymptomatic not hypoxic or febrile.  Closely monitor. 3. History of PSVT on Cardizem. 4. History of CHF and pulmonary hypertension presently  appears compensated closely monitor.  Chest x-ray does show some fluid. 5. Anemia iron deficiency appears to be chronic follow CBC. 6. Recent admission for sepsis secondary to E. coli bacteremia follow blood cultures. 7. History of bronchiectasis and sarcoidosis appears to be stable patient was started on prednisone during last admission.  Was tapered off.  Since patient has possible developing sepsis from UTI and obstructed ureter will need close monitoring for any further deterioration in inpatient status.   DVT prophylaxis: In anticipation of surgical procedure will avoid pharmacological DVT prophylaxis and to be restarted once okay with surgery.  SCDs. Code Status: Full code. Family Communication: Discussed with patient. Disposition Plan: Home. Consults called: Urology. Admission status: Inpatient.   Rise Patience MD [facility] Pager [phone].  If 7PM-7AM, please contact night-coverage [email or web address] Password [facility]  07/30/2019, 12:05 AM

## 2019-07-30 NOTE — Progress Notes (Signed)
Patient daughter, [name], updated on patients positive covid test. Daughter aware that patient is not allowed any visitors due to covid. Daughter provided with dayshift RN's number.

## 2019-07-30 NOTE — Anesthesia Preprocedure Evaluation (Addendum)
Anesthesia Evaluation  Patient identified by MRN, date of birth, ID band Patient awake    Reviewed: Allergy & Precautions, NPO status , Patient's Chart, lab work & pertinent test results  Airway Mallampati: II  TM Distance: >3 FB Neck ROM: Full    Dental  (+) Dental Advisory Given, Teeth Intact   Pulmonary shortness of breath, asthma , pneumonia,  Bronchiectasis   breath sounds clear to auscultation + decreased breath sounds      Cardiovascular hypertension, Pt. on medications + CAD and + DOE   Rhythm:Regular Rate:Normal  Echo 07/19/2019 1. Left ventricular ejection fraction, by estimation, is 55 to 60%. The left ventricle has normal function. The left ventricle has no regional wall motion abnormalities. There is mild left ventricular hypertrophy. Left ventricular diastolic parameters are consistent with Grade I diastolic dysfunction (impaired relaxation).  2. Right ventricular systolic function is hyperdynamic. The right ventricular size is normal. There is mildly elevated pulmonary artery systolic pressure. The estimated right ventricular systolic pressure is 03.5 mmHg.  3. The mitral valve is grossly normal. Trivial mitral valve  regurgitation.  4. The aortic valve is tricuspid. Aortic valve regurgitation is not visualized. Mild aortic valve sclerosis is present, with no evidence of aortic valve stenosis.  5. The inferior vena cava is dilated in size with >50% respiratory variability, suggesting right atrial pressure of 8 mmHg.     Coronary artery disease Cath 3/17: oLCx 60-70 (neg by FFR) Myoview 11/16: no ischemia Myoview 07/2018: EF 73, prob low risk (significant extracardiac uptake), no ischemia   Neuro/Psych  Headaches,  Neuromuscular disease    GI/Hepatic Neg liver ROS, GERD  ,  Endo/Other  negative endocrine ROS  Renal/GU Renal disease     Musculoskeletal  (+) Arthritis ,   Abdominal   Peds   Hematology  (+) Blood dyscrasia, anemia ,   Anesthesia Other Findings   Reproductive/Obstetrics                           Lab Results  Component Value Date   WBC 24.7 (H) 07/29/2019   HGB 11.0 (L) 07/29/2019   HCT 34.6 (L) 07/29/2019   MCV 98.0 07/29/2019   PLT 433 (H) 07/29/2019   Lab Results  Component Value Date   CREATININE 0.91 07/29/2019   BUN 22 07/29/2019   NA 132 (L) 07/29/2019   K 4.0 07/29/2019   CL 95 (L) 07/29/2019   CO2 22 07/29/2019    Anesthesia Physical  Anesthesia Plan  ASA: III and emergent  Anesthesia Plan: General   Post-op Pain Management:    Induction: Intravenous and Rapid sequence  PONV Risk Score and Plan: 3 and Dexamethasone, Ondansetron and Treatment may vary due to age or medical condition  Airway Management Planned: Oral ETT  Additional Equipment: None  Intra-op Plan:   Post-operative Plan: Extubation in OR  Informed Consent: I have reviewed the patients History and Physical, chart, labs and discussed the procedure including the risks, benefits and alternatives for the proposed anesthesia with the patient or authorized representative who has indicated his/her understanding and acceptance.     Dental advisory given  Plan Discussed with: CRNA  Anesthesia Plan Comments:         Anesthesia Quick Evaluation

## 2019-07-30 NOTE — Op Note (Signed)
Preoperative diagnosis:  1. Obstructing left ureteral stone 2. UTI   Postoperative diagnosis:  1. same   Procedure:  1. Cystoscopy 2. left ureteral stent placement 3. left retrograde pyelography with interpretation   Surgeon: Ardis Hughs, MD  Anesthesia: General  Complications: None  Intraoperative findings:  left retrograde pyelography demonstrated a filling defect within the left ureter consistent with the patient's known calculus without other abnormalities.  EBL: Minimal  Specimens: urine culture from bladder after stent placement  Indication: [name] is a 85 y.o. patient with large left obstructing stone and UTI. After reviewing the management options for treatment, he elected to proceed with the above surgical procedure(s). We have discussed the potential benefits and risks of the procedure, side effects of the proposed treatment, the likelihood of the patient achieving the goals of the procedure, and any potential problems that might occur during the procedure or recuperation. Informed consent has been obtained.  Description of procedure:  The patient was taken to the operating room and general anesthesia was induced.  The patient was placed in the dorsal lithotomy position, prepped and draped in the usual sterile fashion, and preoperative antibiotics were administered. A preoperative time-out was performed.   Cystourethroscopy was performed.  The patient's urethra was examined and was normal. The bladder was then systematically examined in its entirety. There was no evidence for any bladder tumors, stones, or other mucosal pathology.    Attention then turned to the leftureteral orifice and a ureteral catheter was used to intubate the ureteral orifice.  Omnipaque contrast was injected through the ureteral catheter and a retrograde pyelogram was performed with findings as dictated above.  A 0.38 sensor guidewire was then advanced up the left ureter into the  renal pelvis under fluoroscopic guidance.  The wire was then backloaded through the cystoscope and a ureteral stent was advance over the wire using Seldinger technique.  The stent was positioned appropriately under fluoroscopic and cystoscopic guidance.  The wire was then removed with an adequate stent curl noted in the renal pelvis as well as in the bladder.  The bladder was then emptied and the procedure ended.  The patient appeared to tolerate the procedure well and without complications.  The patient was able to be awakened and transferred to the recovery unit in satisfactory condition.    Ardis Hughs, M.D.

## 2019-07-30 NOTE — Anesthesia Procedure Notes (Signed)
Procedure Name: Intubation Date/Time: 07/30/2019 1:12 PM Performed by: Silas Sacramento, CRNA Pre-anesthesia Checklist: Patient identified, Emergency Drugs available, Suction available and Patient being monitored Patient Re-evaluated:Patient Re-evaluated prior to induction Oxygen Delivery Method: Circle system utilized Preoxygenation: Pre-oxygenation with 100% oxygen Induction Type: IV induction, Rapid sequence and Cricoid Pressure applied Laryngoscope Size: Glidescope and 3 Grade View: Grade I Tube type: Oral Tube size: 7.0 mm Number of attempts: 1 Airway Equipment and Method: Video-laryngoscopy and Rigid stylet Placement Confirmation: ETT inserted through vocal cords under direct vision,  positive ETCO2 and breath sounds checked- equal and bilateral Secured at: 22 cm Tube secured with: Tape Dental Injury: Teeth and Oropharynx as per pre-operative assessment

## 2019-07-30 NOTE — Progress Notes (Signed)
Patient is a 85 year old female with history of carotid disease, sarcoidosis, PSVT, recent shoulder arthroplasty on 06/13/2019, congestive heart failure, pulmonary hypertension recently discharged on July 24, 2019 after being admitted for sepsis secondary to E. coli bacteremia from UTI who was admitted this morning with abdominal discomfort more on the left side.  Because of this she presented to the emergency room. Found to have low-grade fever but with leukocytosis on lab work from the ED.  Patient's Covid test came back positive but she is asymptomatic.  Patient seen and examined.  Likely early sepsis secondary to urinary tract infection with left-sided hydronephrosis with proximal ureteral stone.  Urology planning to place stent today.  Patient currently n.p.o.  She has history of PSVT, continue to monitor on telemetry.  Continue Cardizem.  Blood cultures pending at this time.  On ceftriaxone.  Plan of care discussed with the patient.

## 2019-07-30 NOTE — ED Notes (Signed)
[facility] called  

## 2019-07-30 NOTE — Consult Note (Signed)
Subjective: 1. Hydronephrosis with urinary obstruction due to ureteral calculus     CC: Left abdominal pain and weakness.  Hx: I was asked to see [name] in consultation by Dr. Isla Pence.  [name] is an 85yo female who was admitted about a week ago with e. Coli urosepsis.  She was not imaged at that time but was having no pain.  She responded to therapy and was discharged home but returned last night to the ER with weakness and left abdominal pain.  Her WBC was back up to 24K and a CT was Mcgrath which showed and obstructing 31mm left proximal stone.   She has had minimal fever and only minimal pain.  She has no voiding complaints and no prior history of stones or UTI's.  She had a bladder tack at [facility] in the past.    ROS:  Review of Systems  Constitutional: Positive for malaise/fatigue.  Respiratory: Negative for shortness of breath.   Cardiovascular: Negative for chest pain and leg swelling.  Gastrointestinal: Positive for abdominal pain.  All other systems reviewed and are negative.   Allergies  Allergen Reactions  . Levaquin [Levofloxacin] Other (See Comments)    Leg pain .Marland Kitchen Per doctor not to take again  . Sulfonamide Derivatives     Pt unsure  . Oxycodone Nausea Only    Can take oxycodone with Zofran  . Morphine Nausea Only    Past Medical History:  Diagnosis Date  . Allergy    SEASONAL  . Arthritis   . Asthma    "slight"   . Baker's cyst of knee, right   . CAD (coronary artery disease)    a. Myoview 10/15 - normal EF 70% // Myoview 11/16: EF 75%, normal perfusion, (there was no blood pressure drop at low level exercise with Lexiscan infusion), low risk study // c. LHC 3/17 - LAD irregs, oLCx 70 (neg FFR), mRCA 30, EF 55-65% >> med Rx // Myoview 07/2018:  EF 73, extracardiac uptake, no significant ischemia (reviewed with Dr. Burt Knack), Low Risk   . Carotid stenosis    a. Carotid US 9/15 - Bilateral 1-39% ICA >> FU 2 years // b. Bilateral ICA 1-39 >> FU prn // Carotid  US 06/2018: bilat 1-39; fu prn  . Dyspnea    due to Sarcoidosis  . Elbow fracture 04/2017   Right, had surgery  . Esophageal reflux   . Hematochezia   . History of echocardiogram    a. Echo 10/15 - EF 60-65%, no RWMA  . HLD (hyperlipidemia)   . Hx of colonoscopy   . Osteoporosis   . Other chronic pulmonary heart diseases   . Paroxysmal supraventricular tachycardia ([place])   . Pneumonia   . Sarcoidosis     Past Surgical History:  Procedure Laterality Date  . arm surgery Right   . BLADDER SURGERY    . CARDIAC CATHETERIZATION N/A 05/20/2015   Procedure: Left Heart Cath and Coronary Angiography;  Surgeon: Sherren Mocha, MD;  Location: [place] [facility];  Service: Cardiovascular;  Laterality: N/A;  . CARPAL TUNNEL RELEASE Left   . COLONOSCOPY    . FOOT SURGERY Right   . HIP SURGERY Right 2011   full replacement  . LEG SURGERY Left May 2014   femur fracture s/p open and closed reduction in [place], Dr. Jimmye Norman  . ORIF ELBOW FRACTURE Right 05/09/2017   Procedure: ORIF right olecranon fracture with repair/reconstruction, ulnar nerve transposition as needed;  Surgeon: Roseanne Kaufman, MD;  Location: [facility];  Service: Orthopedics;  Laterality: Right;  Requests 90 mins  . pelvis fracture    . POLYPECTOMY    . REVERSE SHOULDER ARTHROPLASTY Left 06/13/2019   Procedure: REVERSE SHOULDER ARTHROPLASTY;  Surgeon: Justice Britain, MD;  Location: [facility];  Service: Orthopedics;  Laterality: Left;  169min  . SHOULDER ARTHROSCOPY W/ ROTATOR CUFF REPAIR Right   . TRAPEZIUM RESECTION Right     Social History   Socioeconomic History  . Marital status: Widowed    Spouse name: Not on file  . Number of children: 1  . Years of education: Not on file  . Highest education level: Some college, no degree  Occupational History  . Occupation: retired    Comment: still works as Scientist, 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  Tobacco Use  . Smoking status: Never Smoker  . Smokeless tobacco: Never Used   Substance and Sexual Activity  . Alcohol use: No  . Drug use: No  . Sexual activity: Not Currently    Partners: Male    Birth control/protection: Post-menopausal  Other Topics Concern  . Not on file  Social History Narrative   Lives with daughter and son in law   Caffeine use:  Coffee 1 per day   Right handed   Social Determinants of Health   Financial Resource Strain:   . Difficulty of Paying Living Expenses:   Food Insecurity:   . Worried About Charity fundraiser in the Last Year:   . Arboriculturist in the Last Year:   Transportation Needs:   . Film/video editor (Medical):   Marland Kitchen Lack of Transportation (Non-Medical):   Physical Activity:   . Days of Exercise per Week:   . Minutes of Exercise per Session:   Stress:   . Feeling of Stress :   Social Connections:   . Frequency of Communication with Friends and Family:   . Frequency of Social Gatherings with Friends and Family:   . Attends Religious Services:   . Active Member of Clubs or Organizations:   . Attends Archivist Meetings:   Marland Kitchen Marital Status:   Intimate Partner Violence:   . Fear of Current or Ex-Partner:   . Emotionally Abused:   Marland Kitchen Physically Abused:   . Sexually Abused:     Family History  Problem Relation Age of Onset  . Colon cancer Mother 37  . Cancer Mother   . Lung cancer Father   . Heart disease Father   . Arrhythmia Father   . Cancer Father   . Cancer Sister        ovarian  . Heart attack Brother   . Esophageal cancer Neg Hx   . Rectal cancer Neg Hx   . Stomach cancer Neg Hx   . Stroke Neg Hx     Anti-infectives: Anti-infectives (From admission, onward)   Start     Dose/Rate Route Frequency Ordered Stop   07/30/19 2200  cefTRIAXone (ROCEPHIN) 1 g in sodium chloride 0.9 % 100 mL IVPB     1 g 200 mL/hr over 30 Minutes Intravenous Every 24 hours 07/30/19 0005     07/29/19 2215  cefTRIAXone (ROCEPHIN) 2 g in sodium chloride 0.9 % 100 mL IVPB     2 g 200 mL/hr over 30  Minutes Intravenous  Once 07/29/19 2212 07/29/19 2312      Current Facility-Administered Medications  Medication Dose Route Frequency Provider Last Rate Last Admin  . 0.9 %  sodium chloride infusion   Intravenous Continuous Rise Patience, MD 75 mL/hr at 07/30/19 0100 New Bag at 07/30/19 0100  . albuterol (VENTOLIN HFA) 108 (90 Base) MCG/ACT inhaler 2 puff  2 puff Inhalation Q4H PRN Rise Patience, MD      . cefTRIAXone (ROCEPHIN) 1 g in sodium chloride 0.9 % 100 mL IVPB  1 g Intravenous Q24H Rise Patience, MD      . diltiazem (CARDIZEM CD) 24 hr capsule 180 mg  180 mg Oral QHS Rise Patience, MD   180 mg at 07/30/19 0100  . mometasone-formoterol (DULERA) 200-5 MCG/ACT inhaler 2 puff  2 puff Inhalation BID Rise Patience, MD      . ondansetron [facility]) tablet 4 mg  4 mg Oral Q6H PRN Rise Patience, MD       Or  . ondansetron [facility]) injection 4 mg  4 mg Intravenous Q6H PRN Rise Patience, MD         Objective: Vital signs in last 24 hours: BP (!) 130/56 (BP Location: Right Arm)   Pulse 69   Temp 99 F (37.2 C) (Oral)   Resp 20   Ht 5\' 3"  (1.6 m)   Wt 56.7 kg   LMP 02/22/1983 (Approximate)   SpO2 94%   BMI 22.14 kg/m   Intake/Output from previous day: No intake/output data recorded. Intake/Output this shift: No intake/output data recorded.   Physical Exam Vitals reviewed.  Constitutional:      General: She is not in acute distress.    Appearance: She is well-developed. She is not ill-appearing.  HENT:     Head: Normocephalic and atraumatic.  Cardiovascular:     Rate and Rhythm: Normal rate and regular rhythm.     Heart sounds: Normal heart sounds.  Pulmonary:     Effort: Pulmonary effort is normal. No tachypnea or respiratory distress.     Breath sounds: Decreased breath sounds present.  Abdominal:     Palpations: Abdomen is soft. There is no mass.     Tenderness: There is abdominal tenderness (mild LUQ).   Musculoskeletal:        General: Normal range of motion.     Right lower leg: No tenderness. No edema.     Left lower leg: No tenderness. No edema.  Skin:    General: Skin is warm and dry.  Neurological:     General: No focal deficit present.     Mental Status: She is alert and oriented to person, place, and time.  Psychiatric:        Mood and Affect: Mood normal.        Behavior: Behavior normal.     Lab Results:  Results for orders placed or performed during the hospital encounter of 07/29/19 (from the past 24 hour(s))  Comprehensive metabolic panel     Status: Abnormal   Collection Time: 07/29/19  6:56 PM  Result Value Ref Range   Sodium 132 (L) 135 - 145 mmol/L   Potassium 4.0 3.5 - 5.1 mmol/L   Chloride 95 (L) 98 - 111 mmol/L   CO2 22 22 - 32 mmol/L   Glucose, Bld 138 (H) 70 - 99 mg/dL   BUN 22 8 - 23 mg/dL   Creatinine, Ser 0.91 0.44 - 1.00 mg/dL   Calcium 8.4 (L) 8.9 - 10.3 mg/dL   Total Protein 5.9 (L) 6.5 - 8.1 g/dL   Albumin 2.9 (L) 3.5 - 5.0 g/dL   AST 17 15 -  41 U/L   ALT 25 0 - 44 U/L   Alkaline Phosphatase 106 38 - 126 U/L   Total Bilirubin 0.6 0.3 - 1.2 mg/dL   GFR calc non Af Amer 58 (L) >60 mL/min   GFR calc Af Amer >60 >60 mL/min   Anion gap 15 5 - 15  Lactic acid, plasma     Status: None   Collection Time: 07/29/19  6:56 PM  Result Value Ref Range   Lactic Acid, Venous 1.3 0.5 - 1.9 mmol/L  CBC with Differential     Status: Abnormal   Collection Time: 07/29/19  6:56 PM  Result Value Ref Range   WBC 24.7 (H) 4.0 - 10.5 K/uL   RBC 3.53 (L) 3.87 - 5.11 MIL/uL   Hemoglobin 11.0 (L) 12.0 - 15.0 g/dL   HCT 34.6 (L) 36.0 - 46.0 %   MCV 98.0 80.0 - 100.0 fL   MCH 31.2 26.0 - 34.0 pg   MCHC 31.8 30.0 - 36.0 g/dL   RDW 16.2 (H) 11.5 - 15.5 %   Platelets 433 (H) 150 - 400 K/uL   nRBC 0.0 0.0 - 0.2 %   Neutrophils Relative % 86 %   Neutro Abs 21.3 (H) 1.7 - 7.7 K/uL   Lymphocytes Relative 6 %   Lymphs Abs 1.4 0.7 - 4.0 K/uL   Monocytes Relative 5 %    Monocytes Absolute 1.3 (H) 0.1 - 1.0 K/uL   Eosinophils Relative 0 %   Eosinophils Absolute 0.0 0.0 - 0.5 K/uL   Basophils Relative 0 %   Basophils Absolute 0.0 0.0 - 0.1 K/uL   Immature Granulocytes 3 %   Abs Immature Granulocytes 0.66 (H) 0.00 - 0.07 K/uL  Protime-INR     Status: None   Collection Time: 07/29/19  6:56 PM  Result Value Ref Range   Prothrombin Time 14.5 11.4 - 15.2 seconds   INR 1.2 0.8 - 1.2  Lactic acid, plasma     Status: None   Collection Time: 07/29/19  8:30 PM  Result Value Ref Range   Lactic Acid, Venous 1.2 0.5 - 1.9 mmol/L  Urinalysis, Routine w reflex microscopic     Status: None   Collection Time: 07/29/19  9:39 PM  Result Value Ref Range   Color, Urine YELLOW YELLOW   APPearance CLEAR CLEAR   Specific Gravity, Urine 1.020 1.005 - 1.030   pH 8.0 5.0 - 8.0   Glucose, UA NEGATIVE NEGATIVE mg/dL   Hgb urine dipstick NEGATIVE NEGATIVE   Bilirubin Urine NEGATIVE NEGATIVE   Ketones, ur NEGATIVE NEGATIVE mg/dL   Protein, ur NEGATIVE NEGATIVE mg/dL   Nitrite NEGATIVE NEGATIVE   Leukocytes,Ua NEGATIVE NEGATIVE  SARS Coronavirus 2 by RT PCR (hospital order, performed in [place] hospital lab) Nasopharyngeal Nasopharyngeal Swab     Status: Abnormal   Collection Time: 07/29/19 10:34 PM   Specimen: Nasopharyngeal Swab  Result Value Ref Range   SARS Coronavirus 2 POSITIVE (A) NEGATIVE    BMET Recent Labs    07/29/19 1856  NA 132*  K 4.0  CL 95*  CO2 22  GLUCOSE 138*  BUN 22  CREATININE 0.91  CALCIUM 8.4*   PT/INR Recent Labs    07/29/19 1856  LABPROT 14.5  INR 1.2   ABG No results for input(s): PHART, HCO3 in the last 72 hours.  Invalid input(s): PCO2, PO2  Studies/Results: DG Chest 2 View  Result Date: 07/29/2019 CLINICAL DATA:  Weakness, short of breath for 1 day, sepsis  EXAM: CHEST - 2 VIEW COMPARISON:  07/20/2019 FINDINGS: Frontal and lateral views of the chest demonstrate a stable cardiac silhouette. Lungs are hyperinflated with  background emphysema. There are trace bilateral pleural effusions. Central vascular congestion is chronic. No acute airspace disease or pneumothorax. IMPRESSION: 1. Small bilateral pleural effusions, unchanged since previous exam. 2. Chronic central vascular congestion likely an element of pulmonary arterial hypertension given background emphysema. 3. No acute airspace disease. Electronically Signed   By: Randa Ngo M.D.   On: 07/29/2019 19:38   CT ABDOMEN PELVIS W CONTRAST  Result Date: 07/29/2019 CLINICAL DATA:  Weakness and abdominal pain. EXAM: CT ABDOMEN AND PELVIS WITH CONTRAST TECHNIQUE: Multidetector CT imaging of the abdomen and pelvis was performed using the standard protocol following bolus administration of intravenous contrast. CONTRAST:  185mL OMNIPAQUE IOHEXOL 300 MG/ML  SOLN COMPARISON:  May 19, 2003 FINDINGS: Lower chest: Mild areas of scarring and/or atelectasis are seen in the posterior aspect of the bilateral lung bases. There is a small left pleural effusion. Multiple chronic posterior right-sided rib fractures are seen. Hepatobiliary: A 1.7 cm diameter cystic appearing areas seen within the anteromedial aspect of the right lobe of the liver. 3 mm foci of low attenuation are seen within the posterior aspect of the liver dome. No gallstones, gallbladder wall thickening, or biliary dilatation. Pancreas: Unremarkable. No pancreatic ductal dilatation or surrounding inflammatory changes. Spleen: Normal in size without focal abnormality. Adrenals/Urinary Tract: Adrenal glands are unremarkable. Kidneys are normal in size. A 3.1 cm x 2.4 cm simple cyst is seen within the posterior aspect of the mid right kidney. A 7 mm obstructing renal stone is seen within the proximal left ureter. There is moderate severity left-sided hydronephrosis and hydroureter. Delayed renal cortical enhancement is seen on the left. Mild left perinephric inflammatory fat stranding is also noted. The urinary bladder is  limited in evaluation secondary to overlying streak artifact. Stomach/Bowel: There is a small hiatal hernia. Appendix appears normal. No evidence of bowel wall thickening, distention, or inflammatory changes. Vascular/Lymphatic: There is moderate severity calcification of the abdominal aorta. No enlarged abdominal or pelvic lymph nodes. Reproductive: The expected region of the uterus and bilateral adnexa is limited in evaluation secondary to overlying streak artifact. Other: No abdominal wall hernia or abnormality. No abdominopelvic ascites. Musculoskeletal: There is a total right hip replacement with associated streak artifact. And intramedullary rod and compression screw device are seen within the proximal left femur. Chronic bilateral inferior pubic ramus fractures are seen. Multilevel degenerative changes seen throughout the lumbar spine. IMPRESSION: 1. 7 mm obstructing renal stone within the proximal left ureter with moderate severity left-sided hydronephrosis and hydroureter. 2. Small left pleural effusion. 3. Small hiatal hernia. 4. Total right hip replacement. 5. Prior open reduction and internal fixation of the proximal left femur. Aortic Atherosclerosis (ICD10-I70.0). Electronically Signed   By: Virgina Norfolk M.D.   On: 07/29/2019 21:39   I have discussed the case with the EDP and reviewed the pertinent imaging, labs and notes.   Assessment/Plan: 49mm left proximal stone with obstruction and a history of e. Coli sepsis.  She needs cystoscopy with left ureteral stent insertion and then will need delayed therapy for the stone.  I have reviewed the risks of bleeding, infection, ureteral injury, need for secondary procedures, thrombotic events and anesthetic complications.   I will work on getting this scheduled today with the on call physician.         No follow-ups on file.    CC:  Dr. Yaakov Guthrie and Dr. Isla Pence.    Irine Seal 07/30/2019 [phone]

## 2019-07-30 NOTE — ED Notes (Signed)
Report given to [facility]. 

## 2019-07-30 NOTE — Plan of Care (Signed)
  Problem: Education: Goal: Knowledge of risk factors and measures for prevention of condition will improve Outcome: Progressing   Problem: Coping: Goal: Psychosocial and spiritual needs will be supported Outcome: Progressing   Problem: Respiratory: Goal: Will maintain a patent airway Outcome: Progressing   

## 2019-07-31 DIAGNOSIS — I2510 Atherosclerotic heart disease of native coronary artery without angina pectoris: Secondary | ICD-10-CM

## 2019-07-31 DIAGNOSIS — I471 Supraventricular tachycardia: Secondary | ICD-10-CM

## 2019-07-31 DIAGNOSIS — D869 Sarcoidosis, unspecified: Secondary | ICD-10-CM

## 2019-07-31 DIAGNOSIS — N12 Tubulo-interstitial nephritis, not specified as acute or chronic: Secondary | ICD-10-CM

## 2019-07-31 LAB — CBC WITH DIFFERENTIAL/PLATELET
Abs Immature Granulocytes: 0.36 10*3/uL — ABNORMAL HIGH (ref 0.00–0.07)
Basophils Absolute: 0 10*3/uL (ref 0.0–0.1)
Basophils Relative: 0 %
Eosinophils Absolute: 0 10*3/uL (ref 0.0–0.5)
Eosinophils Relative: 0 %
HCT: 34.1 % — ABNORMAL LOW (ref 36.0–46.0)
Hemoglobin: 11 g/dL — ABNORMAL LOW (ref 12.0–15.0)
Immature Granulocytes: 2 %
Lymphocytes Relative: 5 %
Lymphs Abs: 0.8 10*3/uL (ref 0.7–4.0)
MCH: 31.1 pg (ref 26.0–34.0)
MCHC: 32.3 g/dL (ref 30.0–36.0)
MCV: 96.3 fL (ref 80.0–100.0)
Monocytes Absolute: 0.8 10*3/uL (ref 0.1–1.0)
Monocytes Relative: 5 %
Neutro Abs: 15.7 10*3/uL — ABNORMAL HIGH (ref 1.7–7.7)
Neutrophils Relative %: 88 %
Platelets: 411 10*3/uL — ABNORMAL HIGH (ref 150–400)
RBC: 3.54 MIL/uL — ABNORMAL LOW (ref 3.87–5.11)
RDW: 15.7 % — ABNORMAL HIGH (ref 11.5–15.5)
WBC: 17.8 10*3/uL — ABNORMAL HIGH (ref 4.0–10.5)
nRBC: 0 % (ref 0.0–0.2)

## 2019-07-31 LAB — GLUCOSE, CAPILLARY
Glucose-Capillary: 165 mg/dL — ABNORMAL HIGH (ref 70–99)
Glucose-Capillary: 286 mg/dL — ABNORMAL HIGH (ref 70–99)
Glucose-Capillary: 148 mg/dL — ABNORMAL HIGH (ref 70–99)

## 2019-07-31 LAB — URINE CULTURE: Culture: NO GROWTH

## 2019-07-31 NOTE — Progress Notes (Signed)
PROGRESS NOTE  [name]  OJJ:[id number] DOB: [birth year] DOA: 07/29/2019 PCP: [name], MD   Brief Narrative: [name] is an 85 y.o. female with a recent history of E. coli bacteremia due to UTI who was discharged 6/2 having completed 7 days of ceftriaxone and returned to the ED 6/7 with sepsis, left ureteral stone with hydronephrosis.  Assessment & Plan: Principal Problem:   Sepsis due to urinary tract infection ([place]) Active Problems:   SARCOIDOSIS, PULMONARY   Paroxysmal supraventricular tachycardia (HCC)   CAD (coronary artery disease)   Essential hypertension   Pyelonephritis   Hydronephrosis   Sepsis ([place])  Sepsis due to UTI: Recent history of E. coli bacteremia s/p CTX x7 days. Tought process during luring last admission was no good p.o. option due to resistance profile and patient's allergy to quinolones and sulfa.  - Monitor cystoscopy cultures - Monitor blood cultures - Continue ceftriaxone - Will continue antibiotics for 2 weeks per urology recommendations  Asymptomatic covid infection vs. viral shedding:  - Will discuss with ID, keep on isolation for now.  Obstructive left ureterolithiasis with left hydronephrosis: s/p stent placement 6/8.  - Continue foley per urology. Planning delayed therapy for stone  PSVT:  - Continue diltiazem  Chronic HFpEF, pulmonary HTN: Appears compensated currently - Continue home meds  Iron deficiency anemia:  - Continue to follow CBC  Sarcoidosis, bronchiectasis: - S/p prednisone taper, no indication for stress steroids.  - F/u with pulmonology  DVT prophylaxis: SCDs Code Status: Full Family Communication: Daughter by phone Disposition Plan: Home Status is: Inpatient  Remains inpatient appropriate because:Ongoing diagnostic testing needed not appropriate for outpatient work up and Inpatient level of care appropriate due to severity of illness   Dispo: The patient is from: Home              Anticipated  d/c is to: TBD              Anticipated d/c date is: 2 days              Patient currently is not medically stable to d/c.  Consultants:   Urology  Procedures:  1. Cystoscopy 2. left ureteral stent placement 3. left retrograde pyelography with interpretation  Surgeon: Ardis Hughs, MD  Antimicrobials:  Ceftriaxone   Subjective: Has burning with urination and some leaking around foley. No fevers. Eating well. Has no respiratory complaint, no sore throat, dyspnea, cough, chest pain.  Objective: Vitals:   07/30/19 1633 07/30/19 2106 07/31/19 0630 07/31/19 1211  BP: (!) 142/55 (!) 127/53 (!) 141/63 (!) 147/62  Pulse: 66 74 66 66  Resp: 18 16 15 20   Temp: 97.8 F (36.6 C) 98.2 F (36.8 C) 98.2 F (36.8 C) 98.4 F (36.9 C)  TempSrc: Oral Oral Oral Oral  SpO2: 92% 91% 92% 97%  Weight:      Height:        Intake/Output Summary (Last 24 hours) at 07/31/2019 1742 Last data filed at 07/31/2019 9371 Gross per 24 hour  Intake 300 ml  Output 1600 ml  Net -1300 ml   Filed Weights   07/29/19 1846 07/29/19 1851 07/30/19 0101  Weight: 58 kg 56.7 kg 56.7 kg    Gen: Elderly female in no distress Pulm: Non-labored breathing. Clear to auscultation bilaterally.  CV: Regular rate and rhythm. No murmur, rub, or gallop. No JVD, no pedal edema. GI: Abdomen soft, non-tender, non-distended, with normoactive bowel sounds. No organomegaly or masses felt. Ext: Warm, no  deformities Skin: No rashes, lesions or ulcers Neuro: Alert and oriented. No focal neurological deficits. Psych: Judgement and insight appear normal. Mood & affect appropriate.   Data Reviewed: I have personally reviewed following labs and imaging studies  CBC: Recent Labs  Lab 07/29/19 1856  WBC 24.7*  NEUTROABS 21.3*  HGB 11.0*  HCT 34.6*  MCV 98.0  PLT 962*   Basic Metabolic Panel: Recent Labs  Lab 07/29/19 1856  NA 132*  K 4.0  CL 95*  CO2 22  GLUCOSE 138*  BUN 22  CREATININE 0.91  CALCIUM 8.4*    GFR: Estimated Creatinine Clearance: 38.7 mL/min (by C-G formula based on SCr of 0.91 mg/dL). Liver Function Tests: Recent Labs  Lab 07/29/19 1856  AST 17  ALT 25  ALKPHOS 106  BILITOT 0.6  PROT 5.9*  ALBUMIN 2.9*   No results for input(s): LIPASE, AMYLASE in the last 168 hours. No results for input(s): AMMONIA in the last 168 hours. Coagulation Profile: Recent Labs  Lab 07/29/19 1856  INR 1.2   Cardiac Enzymes: No results for input(s): CKTOTAL, CKMB, CKMBINDEX, TROPONINI in the last 168 hours. BNP (last 3 results) No results for input(s): PROBNP in the last 8760 hours. HbA1C: No results for input(s): HGBA1C in the last 72 hours. CBG: Recent Labs  Lab 07/30/19 0815 07/30/19 1724 07/30/19 2200 07/31/19 0915 07/31/19 1122  GLUCAP 82 70 325* 165* 286*   Lipid Profile: No results for input(s): CHOL, HDL, LDLCALC, TRIG, CHOLHDL, LDLDIRECT in the last 72 hours. Thyroid Function Tests: No results for input(s): TSH, T4TOTAL, FREET4, T3FREE, THYROIDAB in the last 72 hours. Anemia Panel: No results for input(s): VITAMINB12, FOLATE, FERRITIN, TIBC, IRON, RETICCTPCT in the last 72 hours. Urine analysis:    Component Value Date/Time   COLORURINE YELLOW 07/29/2019 2139   APPEARANCEUR CLEAR 07/29/2019 2139   LABSPEC 1.020 07/29/2019 2139   PHURINE 8.0 07/29/2019 2139   GLUCOSEU NEGATIVE 07/29/2019 2139   HGBUR NEGATIVE 07/29/2019 2139   BILIRUBINUR NEGATIVE 07/29/2019 2139   BILIRUBINUR negative 03/21/2018 1328   KETONESUR NEGATIVE 07/29/2019 2139   PROTEINUR NEGATIVE 07/29/2019 2139   UROBILINOGEN negative (A) 03/21/2018 1328   UROBILINOGEN 0.2 12/08/2008 1204   NITRITE NEGATIVE 07/29/2019 2139   LEUKOCYTESUR NEGATIVE 07/29/2019 2139   Recent Results (from the past 240 hour(s))  Culture, blood (Routine x 2)     Status: None (Preliminary result)   Collection Time: 07/29/19  6:57 PM   Specimen: BLOOD  Result Value Ref Range Status   Specimen Description BLOOD  SITE NOT SPECIFIED  Final   Special Requests   Final    BOTTLES DRAWN AEROBIC AND ANAEROBIC Blood Culture adequate volume   Culture   Final    NO GROWTH 2 DAYS Performed at [facility], [place] [address], [place], [place] [address]    Report Status PENDING  Incomplete  Urine culture     Status: Abnormal   Collection Time: 07/29/19  9:39 PM   Specimen: Urine, Random  Result Value Ref Range Status   Specimen Description URINE, RANDOM  Final   Special Requests NONE  Final   Culture (A)  Final    <10,000 COLONIES/mL INSIGNIFICANT GROWTH Performed at [facility], [place] [address], [place], [place] [address]    Report Status 07/30/2019 FINAL  Final  SARS Coronavirus 2 by RT PCR (hospital order, performed in [facility] hospital lab) Nasopharyngeal Nasopharyngeal Swab     Status: Abnormal   Collection Time: 07/29/19  10:34 PM   Specimen: Nasopharyngeal Swab  Result Value Ref Range Status   SARS Coronavirus 2 POSITIVE (A) NEGATIVE Final    Comment: RESULT CALLED TO, READ BACK BY AND VERIFIED WITH: M STEFFENS RN 07/30/19 0032 JDW (NOTE) SARS-CoV-2 target nucleic acids are DETECTED SARS-CoV-2 RNA is generally detectable in upper respiratory specimens  during the acute phase of infection.  Positive results are indicative  of the presence of the identified virus, but do not rule out bacterial infection or co-infection with other pathogens not detected by the test.  Clinical correlation with patient history and  other diagnostic information is necessary to determine patient infection status.  The expected result is negative. Fact Sheet for Patients:   StrictlyIdeas.no  Fact Sheet for Healthcare Providers:   BankingDealers.co.za   This test is not yet approved or cleared by the [place] FDA and  has been authorized for detection and/or diagnosis of SARS-CoV-2 by FDA under an Emergency Use Authorization (EUA).  This EUA will remain  in effect (meaning this test can be use d) for the duration of  the COVID-19 declaration under Section 564(b)(1) of the Act, 21 U.S.C. section 360-bbb-3(b)(1), unless the authorization is terminated or revoked sooner. Performed at [facility], [place] [address] [place], [place] [address]   Culture, blood (Routine x 2)     Status: None (Preliminary result)   Collection Time: 07/29/19 10:42 PM   Specimen: BLOOD  Result Value Ref Range Status   Specimen Description BLOOD RIGHT ARM  Final   Special Requests   Final    BOTTLES DRAWN AEROBIC AND ANAEROBIC Blood Culture adequate volume   Culture   Final    NO GROWTH 1 DAY Performed at [facility], [place] [address] [place], [place] [address]    Report Status PENDING  Incomplete  Surgical pcr screen     Status: None   Collection Time: 07/30/19  8:06 AM   Specimen: Nasal Mucosa; Nasal Swab  Result Value Ref Range Status   MRSA, PCR NEGATIVE NEGATIVE Final   Staphylococcus aureus NEGATIVE NEGATIVE Final    Comment: (NOTE) The Xpert SA Assay (FDA approved for NASAL specimens in patients 25 years of age and older), is one component of a comprehensive surveillance program. It is not intended to diagnose infection nor to guide or monitor treatment. Performed at [facility], [place] [address] [place], [place] [address]       Radiology Studies: DG Chest 2 View  Result Date: 07/29/2019 CLINICAL DATA:  Weakness, short of breath for 1 day, sepsis EXAM: CHEST - 2 VIEW COMPARISON:  07/20/2019 FINDINGS: Frontal and lateral views of the chest demonstrate a stable cardiac silhouette. Lungs are hyperinflated with background emphysema. There are trace bilateral pleural effusions. Central vascular congestion is chronic. No acute airspace disease or pneumothorax. IMPRESSION: 1. Small bilateral pleural effusions, unchanged since previous exam. 2. Chronic central vascular congestion likely an element of pulmonary arterial  hypertension given background emphysema. 3. No acute airspace disease. Electronically Signed   By: Randa Ngo M.D.   On: 07/29/2019 19:38   CT ABDOMEN PELVIS W CONTRAST  Result Date: 07/29/2019 CLINICAL DATA:  Weakness and abdominal pain. EXAM: CT ABDOMEN AND PELVIS WITH CONTRAST TECHNIQUE: Multidetector CT imaging of the abdomen and pelvis was performed using the standard protocol following bolus administration of intravenous contrast. CONTRAST:  181mL OMNIPAQUE IOHEXOL 300 MG/ML  SOLN COMPARISON:  May 19, 2003 FINDINGS: Lower chest: Mild areas of scarring  and/or atelectasis are seen in the posterior aspect of the bilateral lung bases. There is a small left pleural effusion. Multiple chronic posterior right-sided rib fractures are seen. Hepatobiliary: A 1.7 cm diameter cystic appearing areas seen within the anteromedial aspect of the right lobe of the liver. 3 mm foci of low attenuation are seen within the posterior aspect of the liver dome. No gallstones, gallbladder wall thickening, or biliary dilatation. Pancreas: Unremarkable. No pancreatic ductal dilatation or surrounding inflammatory changes. Spleen: Normal in size without focal abnormality. Adrenals/Urinary Tract: Adrenal glands are unremarkable. Kidneys are normal in size. A 3.1 cm x 2.4 cm simple cyst is seen within the posterior aspect of the mid right kidney. A 7 mm obstructing renal stone is seen within the proximal left ureter. There is moderate severity left-sided hydronephrosis and hydroureter. Delayed renal cortical enhancement is seen on the left. Mild left perinephric inflammatory fat stranding is also noted. The urinary bladder is limited in evaluation secondary to overlying streak artifact. Stomach/Bowel: There is a small hiatal hernia. Appendix appears normal. No evidence of bowel wall thickening, distention, or inflammatory changes. Vascular/Lymphatic: There is moderate severity calcification of the abdominal aorta. No enlarged  abdominal or pelvic lymph nodes. Reproductive: The expected region of the uterus and bilateral adnexa is limited in evaluation secondary to overlying streak artifact. Other: No abdominal wall hernia or abnormality. No abdominopelvic ascites. Musculoskeletal: There is a total right hip replacement with associated streak artifact. And intramedullary rod and compression screw device are seen within the proximal left femur. Chronic bilateral inferior pubic ramus fractures are seen. Multilevel degenerative changes seen throughout the lumbar spine. IMPRESSION: 1. 7 mm obstructing renal stone within the proximal left ureter with moderate severity left-sided hydronephrosis and hydroureter. 2. Small left pleural effusion. 3. Small hiatal hernia. 4. Total right hip replacement. 5. Prior open reduction and internal fixation of the proximal left femur. Aortic Atherosclerosis (ICD10-I70.0). Electronically Signed   By: Virgina Norfolk M.D.   On: 07/29/2019 21:39   DG C-Arm 1-60 Min-No Report  Result Date: 07/30/2019 Fluoroscopy was utilized by the requesting physician.  No radiographic interpretation.    Scheduled Meds:  Chlorhexidine Gluconate Cloth  6 each Topical Daily   diltiazem  180 mg Oral QHS   melatonin  5 mg Oral QHS   mometasone-formoterol  2 puff Inhalation BID   Continuous Infusions:  cefTRIAXone (ROCEPHIN)  IV 1 g (07/30/19 2111)     LOS: 2 days   Time spent: 25 minutes.  Patrecia Pour, MD [facility] [email or web address] 07/31/2019, 5:42 PM

## 2019-07-31 NOTE — Plan of Care (Signed)
  Problem: Education: Goal: Knowledge of risk factors and measures for prevention of condition will improve Outcome: Progressing   Problem: Coping: Goal: Psychosocial and spiritual needs will be supported Outcome: Progressing   Problem: Respiratory: Goal: Will maintain a patent airway Outcome: Progressing Goal: Complications related to the disease process, condition or treatment will be avoided or minimized Outcome: Progressing   

## 2019-08-01 ENCOUNTER — Other Ambulatory Visit: Payer: Self-pay | Admitting: Urology

## 2019-08-01 ENCOUNTER — Encounter (HOSPITAL_COMMUNITY): Payer: Self-pay | Admitting: Urology

## 2019-08-01 LAB — COMPREHENSIVE METABOLIC PANEL
ALT: 19 U/L (ref 0–44)
AST: 13 U/L — ABNORMAL LOW (ref 15–41)
Albumin: 2.5 g/dL — ABNORMAL LOW (ref 3.5–5.0)
Alkaline Phosphatase: 83 U/L (ref 38–126)
Anion gap: 9 (ref 5–15)
BUN: 26 mg/dL — ABNORMAL HIGH (ref 8–23)
CO2: 22 mmol/L (ref 22–32)
Calcium: 8.4 mg/dL — ABNORMAL LOW (ref 8.9–10.3)
Chloride: 106 mmol/L (ref 98–111)
Creatinine, Ser: 0.69 mg/dL (ref 0.44–1.00)
GFR calc Af Amer: >60 mL/min (ref >60)
GFR calc non Af Amer: >60 mL/min (ref >60)
Glucose, Bld: 138 mg/dL — ABNORMAL HIGH (ref 70–99)
Potassium: 4.4 mmol/L (ref 3.5–5.1)
Sodium: 137 mmol/L (ref 135–145)
Total Bilirubin: 0.4 mg/dL (ref 0.3–1.2)
Total Protein: 5.4 g/dL — ABNORMAL LOW (ref 6.5–8.1)

## 2019-08-01 LAB — GLUCOSE, CAPILLARY
Glucose-Capillary: 163 mg/dL — ABNORMAL HIGH (ref 70–99)
Glucose-Capillary: 124 mg/dL — ABNORMAL HIGH (ref 70–99)

## 2019-08-01 MED ORDER — LIP MEDEX EX OINT
TOPICAL_OINTMENT | CUTANEOUS | Status: DC | PRN
  Filled 2019-08-01: qty 7

## 2019-08-01 NOTE — Discharge Summary (Signed)
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     Status: None   Collection Time: 07/30/19  8:06 AM   Specimen: Nasal Mucosa; Nasal Swab  Result Value Ref Range Status   MRSA, PCR NEGATIVE NEGATIVE Final   Staphylococcus aureus NEGATIVE NEGATIVE Final    Comment: (NOTE) The Xpert SA Assay (FDA approved for NASAL specimens in patients 49 years of age and older), is one component of a comprehensive surveillance program. It is not intended to diagnose infection nor to guide or monitor treatment. Performed at [facility], [place] [address] [place], [place] [address]   Urine Culture     Status: None   Collection Time: 07/30/19  3:42 PM   Specimen: Urine, Cystoscope  Result Value Ref Range Status   Specimen Description   Final    CYSTOSCOPY URINE BLADDER URINE CATHETER Performed at [facility]  [address] [place], [place] [address]    Special Requests   Final    NONE Performed at [facility], [place] [address] [place], [place] [address]    Culture   Final    NO GROWTH Performed at [facility], [place] [address], [place], [place] [address]    Report Status 07/31/2019 FINAL  Final    Time coordinating discharge: Approximately 40 minutes  Patrecia Pour, MD  [facility] 08/01/2019, 8:31 PM

## 2019-08-01 NOTE — Progress Notes (Signed)
AVS given to patient and explained at the bedside. Medications and follow up appointments have been explained with pt verbalizing understanding.  

## 2019-08-01 NOTE — Evaluation (Signed)
Physical Therapy Evaluation Patient Details Name: [name] MRN: [id number] DOB: 01-08-1935 Today's Date: 08/01/2019   History of Present Illness  85 y.o. female with a recent history of E. coli bacteremia due to UTI who was discharged 6/2 having completed 7 days of ceftriaxone and returned to the ED 6/7 with sepsis, left ureteral stone with hydronephrosis. s/p L ureteral stent 07/30/19.  Clinical Impression  Pt admitted with above diagnosis. Pt ambulated 110' with hand held assist of 1. SaO2 94% on room air with ambulation, 97% on room air at rest. Pt reports she feels weak compared to her baseline with mobility. Recommended pt use her cane at home for increased support as she recovers from recent hospitalizations.  Pt currently with functional limitations due to the deficits listed below (see PT Problem List). Pt will benefit from skilled PT to increase their independence and safety with mobility to allow discharge to the venue listed below.       Follow Up Recommendations Home health PT    Equipment Recommendations  None recommended by PT    Recommendations for Other Services       Precautions / Restrictions Precautions Precautions: Fall Precaution Comments: reports 1 fall in past 1 year, in October 2020 Restrictions Weight Bearing Restrictions: No      Mobility  Bed Mobility               General bed mobility comments: up in recliner  Transfers Overall transfer level: Needs assistance Equipment used: None Transfers: Sit to/from Stand Sit to Stand: Min guard         General transfer comment: min guard from chair  Ambulation/Gait Ambulation/Gait assistance: Min guard Gait Distance (Feet): 110 Feet Assistive device: 1 person hand held assist Gait Pattern/deviations: Step-through pattern;Decreased stride length Gait velocity: decr   General Gait Details: steady, no loss of balance, SaO2 94% on room air, 1 standing rest break 2* 3/4 dyspnea (pt reports  this is baseline)  Science writer    Modified Rankin (Stroke Patients Only)       Balance Overall balance assessment: Mild deficits observed, not formally tested                                           Pertinent Vitals/Pain Pain Assessment: No/denies pain    Home Living Family/patient expects to be discharged to:: Private residence Living Arrangements: Children Available Help at Discharge: Family;Available 24 hours/day Type of Home: House Home Access: Stairs to enter   [employer] of Steps: 1 Home Layout: One level Home Equipment: Bedside commode;Shower seat Additional Comments: lives with daughter and son in [place]. daughter works from home.    Prior Function Level of Independence: Independent         Comments: drives, walks without AD, "I do everything".     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    Lower Extremity Assessment Lower Extremity Assessment: Overall WFL for tasks assessed    Cervical / Trunk Assessment Cervical / Trunk Assessment: Normal  Communication   Communication: No difficulties  Cognition Arousal/Alertness: Awake/alert Behavior During Therapy: WFL for tasks assessed/performed Overall Cognitive Status: Within Functional Limits for  tasks assessed                                        General Comments      Exercises     Assessment/Plan    PT Assessment Patient needs continued PT services  PT Problem List Decreased strength;Decreased activity tolerance;Decreased mobility;Decreased knowledge of use of DME;Cardiopulmonary status limiting activity       PT Treatment Interventions DME instruction;Gait training;Functional mobility training;Therapeutic activities;Patient/family  education;Balance training    PT Goals (Current goals can be found in the Care Plan section)  Acute Rehab PT Goals Patient Stated Goal: to get home  PT Goal Formulation: With patient Time For Goal Achievement: 08/15/19 Potential to Achieve Goals: Good    Frequency Min 3X/week   Barriers to discharge        Co-evaluation               AM-PAC PT "6 Clicks" Mobility  Outcome Measure Help needed turning from your back to your side while in a flat bed without using bedrails?: A Little Help needed moving from lying on your back to sitting on the side of a flat bed without using bedrails?: A Little Help needed moving to and from a bed to a chair (including a wheelchair)?: A Little Help needed standing up from a chair using your arms (e.g., wheelchair or bedside chair)?: A Little Help needed to walk in hospital room?: A Little Help needed climbing 3-5 steps with a railing? : A Little 6 Click Score: 18    End of Session Equipment Utilized During Treatment: Gait belt Activity Tolerance: Patient tolerated treatment well;Patient limited by fatigue Patient left: with call bell/phone within reach;in chair Nurse Communication: Mobility status PT Visit Diagnosis: Other abnormalities of gait and mobility (R26.89);Difficulty in walking, not elsewhere classified (R26.2)    Time: 1102-1117 PT Time Calculation (min) (ACUTE ONLY): 17 min   Charges:   PT Evaluation $PT Eval Low Complexity: 1 Low         Blondell Reveal Kistler PT 08/01/2019  Acute Rehabilitation Services Pager [phone] Office [phone]

## 2019-08-01 NOTE — Evaluation (Signed)
Occupational Therapy Evaluation Patient Details Name: [name] MRN: [id number] DOB: [birth year] Today's Date: 08/01/2019    History of Present Illness 85 y.o. female with a recent history of E. coli bacteremia due to UTI who was discharged 6/2 having completed 7 days of ceftriaxone and returned to the ED 6/7 with sepsis, left ureteral stone with hydronephrosis. s/p L ureteral stent 07/30/19.   Clinical Impression   [name] is an 85 year old woman admitted to hospital with uretal stone and hydronephrosis with a recent hospitalization for UTI and shoulder replacement. On evaluation patient demonstrates deficits of left shoulder secondary to recent total shoulder replacement, required min assist to transfer out of bed and needed supervision for ADLS to limit use of left upper extremity due to possible shoulder precautions. Patient demonstrated ability to perform ambulation with hand hold assist without loss of balance. Patient okay to return home with family and continue outpatient therapy.     Follow Up Recommendations  Follow surgeon's recommendation for DC plan and follow-up therapies;Outpatient OT    Equipment Recommendations  None recommended by OT    Recommendations for Other Services       Precautions / Restrictions Precautions Precautions: Fall;Shoulder Type of Shoulder Precautions: recent shoulder surgery Precaution Booklet Issued: No Precaution Comments: reports 1 fall in past 1 year, in October 2020 Restrictions Weight Bearing Restrictions: No Other Position/Activity Restrictions: unknown.      Mobility Bed Mobility               General bed mobility comments: min assist for hand hold to transfer out of bed  Transfers Overall transfer level: Needs assistance Equipment used: None Transfers: Sit to/from Stand Sit to Stand: Min guard         General transfer comment: min guard and hand hold to ambulate in room.    Balance Overall balance  assessment: Mild deficits observed, not formally tested                                         ADL either performed or assessed with clinical judgement   ADL Overall ADL's : At baseline     Grooming: Supervision/safety;Sitting   Upper Body Bathing: Supervision/ safety;Sitting   Lower Body Bathing: Supervison/ safety   Upper Body Dressing : Supervision/safety;Sitting   Lower Body Dressing: Supervision/safety;Sit to/from stand       Toileting- Water quality scientist and Hygiene: Min guard;Supervision/safety       Functional mobility during ADLs: Min guard General ADL Comments: Reports feeling weak compared to normal.     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; decreased shoulder ROM/strength; not formally tested   Lower Extremity Assessment Lower Extremity Assessment: Overall WFL for tasks assessed   Cervical / Trunk Assessment Cervical / Trunk Assessment: Normal   Communication Communication Communication: No difficulties   Cognition Arousal/Alertness: Awake/alert Behavior During Therapy: WFL for tasks assessed/performed Overall Cognitive Status: Within Functional Limits for tasks assessed  General Comments       Exercises     Shoulder Instructions      Home Living Family/patient expects to be discharged to:: Private residence Living Arrangements: Children Available Help at Discharge: Family;Available 24 hours/day Type of Home: House Home Access: Stairs to enter [employer] of Steps: 1   Home Layout: One level      Bathroom Shower/Tub: Occupational psychologist: Standard Bathroom Accessibility: Yes   Home Equipment: Bedside commode;Shower seat   Additional Comments: lives with daughter and son in [place]. daughter works from home.      Prior Functioning/Environment Level of Independence: Independent        Comments: drives, walks without AD, "I do everything".        OT Problem List: Decreased strength;Decreased range of motion;Decreased activity tolerance;Impaired balance (sitting and/or standing);Pain;Impaired UE functional use      OT Treatment/Interventions: Self-care/ADL training;Therapeutic exercise;DME and/or AE instruction;Therapeutic activities;Patient/family education;Balance training    OT Goals(Current goals can be found in the care plan section) Acute Rehab OT Goals Patient Stated Goal: to get home  OT Goal Formulation: With patient Time For Goal Achievement: 08/03/19 Potential to Achieve Goals: Good  OT Frequency:     Barriers to D/C:            Co-evaluation              AM-PAC OT "6 Clicks" Daily Activity     Outcome Measure Help from another person eating meals?: None Help from another person taking care of personal grooming?: None Help from another person toileting, which includes using toliet, bedpan, or urinal?: A Little Help from another person bathing (including washing, rinsing, drying)?: A Little Help from another person to put on and taking off regular upper body clothing?: A Little Help from another person to put on and taking off regular lower body clothing?: A Little 6 Click Score: 20   End of Session Nurse Communication: Mobility status  Activity Tolerance: Patient tolerated treatment well Patient left: in chair;with call bell/phone within reach;with chair alarm set;with nursing/sitter in room  OT Visit Diagnosis: Unsteadiness on feet (R26.81);Muscle weakness (generalized) (M62.81)                Time: 8242-3536 OT Time Calculation  (min): 23 min Charges:  OT General Charges $OT Visit: 1 Visit OT Evaluation $OT Eval Low Complexity: 1 Low  [name], OTR/L [place]  Office [phone] Pager: [place] 08/01/2019, 12:47 PM

## 2019-08-01 NOTE — Care Management Important Message (Signed)
Important Message  Patient Details IM Letter given to Gabriel Earing RN Case Manager to present to the Patient Name: [name] MRN: [id number] Date of Birth: Apr 14, 1934   Medicare Important Message Given:  Yes     Kerin Salen 08/01/2019, 12:45 PM

## 2019-08-01 NOTE — TOC Progression Note (Signed)
Transition of Care [facility]) - Progression Note    Patient Details  Name: [name] MRN: [id number] Date of Birth: [birth year]  Transition of Care [facility]) CM/SW Contact  Purcell Mouton, RN Phone Number: 08/01/2019, 2:52 PM  Clinical Narrative:    Pt selected [facility] for [facility]. Pt will discharge with [facility]. Referral given to in house rep.   Expected Discharge Plan: Home/Self Care Barriers to Discharge: No Barriers Identified  Expected Discharge Plan and Services Expected Discharge Plan: Home/Self Care       Living arrangements for the past 2 months: Single Family Home Expected Discharge Date: 08/01/19                                     Social Determinants of Health (SDOH) Interventions    Readmission Risk Interventions No flowsheet data found.

## 2019-08-01 NOTE — TOC Progression Note (Signed)
Transition of Care [facility]) - Progression Note    Patient Details  Name: [name] MRN: [id number] Date of Birth: 05/17/1934  Transition of Care [facility]) CM/SW Contact  Purcell Mouton, RN Phone Number: 08/01/2019, 10:58 AM  Clinical Narrative:     Pt from home with Adult children and plan to return when stable.   Expected Discharge Plan: Home/Self Care Barriers to Discharge: No Barriers Identified  Expected Discharge Plan and Services Expected Discharge Plan: Home/Self Care       Living arrangements for the past 2 months: Single Family Home                                       Social Determinants of Health (SDOH) Interventions    Readmission Risk Interventions No flowsheet data found.

## 2019-08-03 LAB — CULTURE, BLOOD (ROUTINE X 2)
Culture: NO GROWTH
Special Requests: ADEQUATE

## 2019-08-04 LAB — CULTURE, BLOOD (ROUTINE X 2)
Culture: NO GROWTH
Special Requests: ADEQUATE

## 2019-08-13 ENCOUNTER — Encounter (HOSPITAL_COMMUNITY): Payer: Self-pay

## 2019-08-13 ENCOUNTER — Encounter (HOSPITAL_COMMUNITY)
Admission: RE | Admit: 2019-08-13 | Discharge: 2019-08-13 | Disposition: A | Discharge status: Home / Self Care | Payer: Medicare Other | Source: Ambulatory Visit | Attending: Urology | Admitting: Urology

## 2019-08-13 ENCOUNTER — Other Ambulatory Visit: Payer: Self-pay

## 2019-08-13 DIAGNOSIS — Z01812 Encounter for preprocedural laboratory examination: Secondary | ICD-10-CM | POA: Diagnosis not present

## 2019-08-13 HISTORY — DX: Cardiac arrhythmia, unspecified: I49.9

## 2019-08-13 HISTORY — DX: Anemia, unspecified: D64.9

## 2019-08-13 NOTE — Progress Notes (Signed)
COVID Vaccine Completed:yes Date COVID Vaccine completed:03/2019 COVID vaccine manufacturer: *Pf[employer]    [employer]   PCP - Dr. [name] Cardiologist - Dr. Sherren Mocha. LOV: 05/31/19. [name]: PA-C: 05/31/19  Chest x-ray - 07/29/19. EPIC EKG - 07/30/19. EPIC Stress Test -  ECHO -  Cardiac Cath -   Sleep Study -  CPAP -   Fasting Blood Sugar -  Checks Blood Sugar _____ times a day  Blood Thinner Instructions: Aspirin Instructions: Last Dose:  Anesthesia review: Hx: CAD,PST  Patient denies shortness of breath, fever, cough and chest pain at PAT appointment   Patient verbalized understanding of instructions that were given to them at the PAT appointment. Patient was also instructed that they will need to review over the PAT instructions again at home before surgery.

## 2019-08-13 NOTE — Patient Instructions (Addendum)
DUE TO COVID-19 ONLY ONE VISITOR IS ALLOWED TO COME WITH YOU AND STAY IN THE WAITING ROOM ONLY DURING PRE OP AND PROCEDURE DAY OF SURGERY. THE 1 VISITOR MAY VISIT WITH YOU AFTER SURGERY IN YOUR PRIVATE ROOM DURING VISITING HOURS ONLY!  YOU NEED TO HAVE A COVID 19 TEST ON: 08/16/19 @ 10:30 am, THIS TEST MUST BE DONE BEFORE SURGERY, COME  [place], [name] , [address].  ([place]) ONCE YOUR COVID TEST IS COMPLETED, PLEASE BEGIN THE QUARANTINE INSTRUCTIONS AS OUTLINED IN YOUR HANDOUT.                [name]   Your procedure is scheduled on: 08/20/19   Report to [facility] Main  Entrance   Report to admitting at: 8:30 AM     Call this number if you have problems the morning of surgery [phone]    Remember: Do not eat solid food:After Midnight. Clear liquids from midnight until: 7:30 am    CLEAR LIQUID DIET   Foods Allowed                                                                     Foods Excluded  Coffee and tea, regular and decaf                             liquids that you cannot  Plain Jell-O any favor except red or purple                                           see through such as: Fruit ices (not with fruit pulp)                                     milk, soups, orange juice  Iced Popsicles                                    All solid food Carbonated beverages, regular and diet                                    Cranberry, grape and apple juices Sports drinks like Gatorade Lightly seasoned clear broth or consume(fat free) Sugar, honey syrup  Sample Menu Breakfast                                Lunch                                     Supper Cranberry juice                    Beef broth  Chicken broth Jell-O                                     Grape juice                           Apple juice Coffee or tea                        Jell-O                                      Popsicle                                                 Coffee or tea                        Coffee or tea  _____________________________________________________________________   BRUSH YOUR TEETH MORNING OF SURGERY AND RINSE YOUR MOUTH OUT, NO CHEWING GUM CANDY OR MINTS.     Take these medicines the morning of surgery with A SIP OF WATER:  Diltiazem,omeprazole,florastor.Use Flonase and inhalers as usual.                               You may not have any metal on your body including hair pins and              piercings  Do not wear jewelry, make-up, lotions, powders or perfumes, deodorant             Do not wear nail polish on your fingernails.  Do not shave  48 hours prior to surgery.               Do not bring valuables to the hospital. [place].  Contacts, dentures or bridgework may not be worn into surgery.  Leave suitcase in the car. After surgery it may be brought to your room.     Patients discharged the day of surgery will not be allowed to drive home. IF YOU ARE HAVING SURGERY AND GOING HOME THE SAME DAY, YOU MUST HAVE AN ADULT TO DRIVE YOU HOME AND BE WITH YOU FOR 24 HOURS. YOU MAY GO HOME BY TAXI OR UBER OR ORTHERWISE, BUT AN ADULT MUST ACCOMPANY YOU HOME AND STAY WITH YOU FOR 24 HOURS.  Name and phone number of your driver:  Special Instructions: N/A              Please read over the following fact sheets you were given: _____________________________________________________________________  [facility] - Preparing for Surgery Before surgery, you can play an important role.  Because skin is not sterile, your skin needs to be as free of germs as possible.  You can reduce the number of germs on your skin by washing with CHG (chlorahexidine gluconate) soap before surgery.  CHG is an antiseptic cleaner which kills germs and bonds with the skin to continue killing germs even after washing. Please DO  NOT use if you have an allergy to CHG or  antibacterial soaps.  If your skin becomes reddened/irritated stop using the CHG and inform your nurse when you arrive at Short Stay. Do not shave (including legs and underarms) for at least 48 hours prior to the first CHG shower.  You may shave your face/neck. Please follow these instructions carefully:  1.  Shower with CHG Soap the night before surgery and the  morning of Surgery.  2.  If you choose to wash your hair, wash your hair first as usual with your  normal  shampoo.  3.  After you shampoo, rinse your hair and body thoroughly to remove the  shampoo.                           4.  Use CHG as you would any other liquid soap.  You can apply chg directly  to the skin and wash                       Gently with a scrungie or clean washcloth.  5.  Apply the CHG Soap to your body ONLY FROM THE NECK DOWN.   Do not use on face/ open                           Wound or open sores. Avoid contact with eyes, ears mouth and genitals (private parts).                       Wash face,  Genitals (private parts) with your normal soap.             6.  Wash thoroughly, paying special attention to the area where your surgery  will be performed.  7.  Thoroughly rinse your body with warm water from the neck down.  8.  DO NOT shower/wash with your normal soap after using and rinsing off  the CHG Soap.                9.  Pat yourself dry with a clean towel.            10.  Wear clean pajamas.            11.  Place clean sheets on your bed the night of your first shower and do not  sleep with pets. Day of Surgery : Do not apply any lotions/deodorants the morning of surgery.  Please wear clean clothes to the hospital/surgery center.  FAILURE TO FOLLOW THESE INSTRUCTIONS MAY RESULT IN THE CANCELLATION OF YOUR SURGERY PATIENT SIGNATURE_________________________________  NURSE SIGNATURE__________________________________  ________________________________________________________________________ [facility] - Preparing  for Surgery Before surgery, you can play an important role.  Because skin is not sterile, your skin needs to be as free of germs as possible.  You can reduce the number of germs on your skin by washing with CHG (chlorahexidine gluconate) soap before surgery.  CHG is an antiseptic cleaner which kills germs and bonds with the skin to continue killing germs even after washing. Please DO NOT use if you have an allergy to CHG or antibacterial soaps.  If your skin becomes reddened/irritated stop using the CHG and inform your nurse when you arrive at Short Stay. Do not shave (including legs and underarms) for at least 48 hours prior to the first CHG shower.  You may shave your face/neck. Please  follow these instructions carefully:  1.  Shower with CHG Soap the night before surgery and the  morning of Surgery.  2.  If you choose to wash your hair, wash your hair first as usual with your  normal  shampoo.  3.  After you shampoo, rinse your hair and body thoroughly to remove the  shampoo.                           4.  Use CHG as you would any other liquid soap.  You can apply chg directly  to the skin and wash                       Gently with a scrungie or clean washcloth.  5.  Apply the CHG Soap to your body ONLY FROM THE NECK DOWN.   Do not use on face/ open                           Wound or open sores. Avoid contact with eyes, ears mouth and genitals (private parts).                       Wash face,  Genitals (private parts) with your normal soap.             6.  Wash thoroughly, paying special attention to the area where your surgery  will be performed.  7.  Thoroughly rinse your body with warm water from the neck down.  8.  DO NOT shower/wash with your normal soap after using and rinsing off  the CHG Soap.                9.  Pat yourself dry with a clean towel.            10.  Wear clean pajamas.            11.  Place clean sheets on your bed the night of your first shower and do not  sleep with  pets. Day of Surgery : Do not apply any lotions/deodorants the morning of surgery.  Please wear clean clothes to the hospital/surgery center.  FAILURE TO FOLLOW THESE INSTRUCTIONS MAY RESULT IN THE CANCELLATION OF YOUR SURGERY PATIENT SIGNATURE_________________________________  NURSE SIGNATURE__________________________________  ________________________________________________________________________

## 2019-08-15 ENCOUNTER — Other Ambulatory Visit: Payer: Self-pay

## 2019-08-15 ENCOUNTER — Encounter (HOSPITAL_COMMUNITY)
Admission: RE | Admit: 2019-08-15 | Discharge: 2019-08-15 | Disposition: A | Discharge status: Home / Self Care | Payer: Medicare Other | Source: Ambulatory Visit | Attending: Urology | Admitting: Urology

## 2019-08-15 DIAGNOSIS — Z01812 Encounter for preprocedural laboratory examination: Secondary | ICD-10-CM | POA: Diagnosis not present

## 2019-08-15 LAB — CBC
HCT: 35.9 % — ABNORMAL LOW (ref 36.0–46.0)
Hemoglobin: 11.5 g/dL — ABNORMAL LOW (ref 12.0–15.0)
MCH: 32.5 pg (ref 26.0–34.0)
MCHC: 32 g/dL (ref 30.0–36.0)
MCV: 101.4 fL — ABNORMAL HIGH (ref 80.0–100.0)
Platelets: 273 10*3/uL (ref 150–400)
RBC: 3.54 MIL/uL — ABNORMAL LOW (ref 3.87–5.11)
RDW: 16.8 % — ABNORMAL HIGH (ref 11.5–15.5)
WBC: 7.5 10*3/uL (ref 4.0–10.5)
nRBC: 0 % (ref 0.0–0.2)

## 2019-08-15 LAB — BASIC METABOLIC PANEL
Anion gap: 10 (ref 5–15)
BUN: 30 mg/dL — ABNORMAL HIGH (ref 8–23)
CO2: 24 mmol/L (ref 22–32)
Calcium: 9.2 mg/dL (ref 8.9–10.3)
Chloride: 105 mmol/L (ref 98–111)
Creatinine, Ser: 0.78 mg/dL (ref 0.44–1.00)
GFR calc Af Amer: >60 mL/min (ref >60)
GFR calc non Af Amer: >60 mL/min (ref >60)
Glucose, Bld: 96 mg/dL (ref 70–99)
Potassium: 4.8 mmol/L (ref 3.5–5.1)
Sodium: 139 mmol/L (ref 135–145)

## 2019-08-15 NOTE — H&P (Signed)
CC/HPI: cc: ureteral calculus   08/15/19: 85 year old woman who underwent ureteral stent placement for 7 mm left proximal obstructing ureteral calculus associated with signs of sepsis on 07/30/19. Patient also had COVID+ while in hospital. She is now scheduled for definitive management of stone to be done next week.     ALLERGIES: Levaquin - Other Reaction, Severe Leg pain. Morphine Derivatives Percocet TABS Toviaz TB24   MEDICATIONS: Aspirin  Myrbetriq  Omeprazole  Vesicare 5 mg tablet 1 tablet PO Daily  Cetirizine Hcl  DilTIAZem HCl ER 180 MG Oral Capsule Extended Release 24 Hour Oral  Rosuvastatin Calcium    GU PSH: Cystoscopy - 01/05/2018      PSH Notes: Femur Repair, Bladder Surgery, Foot Surgery, Incisional Breast Biopsy  NON-GU PSH: Anesth, Bladder Surgery - 1995 Anesth, Surgery Of Femur - 2014 Breast Biopsy - 2013 Elbow Arthroscopy/surgery - 01/05/2018 Hip Replacement - 2012 Neuroeltrd Stim Post Tibial - 06/04/2019, 05/28/2019, 05/14/2019, 04/30/2019, 04/23/2019, 04/16/2019, 11/06/2018, 10/16/2018, 09/18/2018, 08/28/2018, 08/07/2018, 07/10/2018, 06/19/2018, 05/31/2018, 05/24/2018, 05/17/2018, 05/10/2018, 05/03/2018, 04/26/2018, 04/19/2018, 04/12/2018, 04/05/2018, 03/29/2018, 03/22/2018, 03/15/2018       GU PMH: Renal calculus, Incidental finding of a 5 mm left nonobstructing lower pole renal calculus. This is fine to observe. - 03/28/2019 Renal cyst, Reviewed CT of the chest which shows a 3 mm simple right renal cyst. No follow-up needed. - 03/28/2019 Urge incontinence - 01/05/2018 Nocturia, Nocturia - 2016 Urinary Frequency, Urinary frequency - 2016 Oth GU systems Signs/Symptoms, Bladder pain - 2014     PMH Notes:  2011-06-01 13:19:40 - Note: Arthritis  NON-GU PMH: Encounter for general adult medical examination without abnormal findings, Encounter for preventive health examination - 2016 Personal history of (healed) traumatic fracture, History of fracture of femur - 2016 Personal history of other  diseases of the digestive system, History of esophageal reflux - 2014 Personal history of other endocrine, nutritional and metabolic disease, History of hypercholesterolemia - 2014 Sarcoidosis, unspecified, Sarcoidosis - 2014 Arthritis Asthma GERD Hypercholesterolemia   FAMILY HISTORY: 1 Daughter - Daughter Blood in the urine - Mother Colon Cancer - Runs In Family Death In The Family Father - Runs In Family Death In The Family Mother - Runs In [place] _4__ Living Daughter - Runs In Family kidney stone - Father nephrolithiasis - Runs In Family No pertinent family history - Other  SOCIAL HISTORY: Marital Status: Widowed Preferred Language: English; Ethnicity: Not Hispanic Or Latino; Race: White Current Smoking Status: Patient has never smoked.  <DIV'  Tobacco Use Assessment Completed:  Used Tobacco in last 30 days?   Has never drank.  Does not drink caffeine. Patient's occupation is/was Retired.    Notes: Occupation: Retired, Caffeine Use, Never A Smoker, Alcohol Use, Marital History - Widowed  REVIEW OF SYSTEMS:    GU Review Female:  Patient denies stream starts and stops, leakage of urine, hard to postpone urination, frequent urination, trouble starting your stream, have to strain to urinate, being pregnant, get up at night to urinate, and burning /pain with urination.   Gastrointestinal (Upper):  Patient denies nausea, vomiting, and indigestion/ heartburn.   Gastrointestinal (Lower):  Patient denies diarrhea and constipation.   Constitutional:  Patient denies fever, night sweats, weight loss, and fatigue.   Skin:  Patient denies skin rash/ lesion and itching.   Eyes:  Patient denies blurred vision and double vision.   Ears/ Nose/ Throat:  Patient denies sore throat and sinus problems.   Hematologic/Lymphatic:  Patient denies swollen glands and easy bruising.  Cardiovascular:  Patient denies leg swelling and chest pains.   Respiratory:  Patient  denies cough and shortness of breath.   Endocrine:  Patient denies excessive thirst.   Musculoskeletal:  Patient denies back pain and joint pain.   Neurological:  Patient denies headaches and dizziness.   Psychologic:  Patient denies depression and anxiety.   VITAL SIGNS:      08/15/2019 01:54 PM    Weight 123 lb / 55.79 kg    Height 63 in / 160.02 cm    BP 114/64 mmHg    Pulse 85 /min    Temperature 97.4 F / 36.3 C    BMI 21.8 kg/m    MULTI-SYSTEM PHYSICAL EXAMINATION:     Constitutional: Well-nourished. No physical deformities. Normally developed. Good grooming.    Neck: Neck symmetrical, not swollen. Normal tracheal position.    Respiratory: No labored breathing, no use of accessory muscles.     Cardiovascular: Normal temperature    Skin: No paleness, no jaundice, no cyanosis. No lesion, no ulcer, no rash.    Neurologic / Psychiatric: Oriented to time, oriented to place, oriented to person. No depression, no anxiety, no agitation.    Gastrointestinal: No rigidity, non obese abdomen.     Eyes: Normal conjunctivae. Normal eyelids.    Ears, Nose, Mouth, and Throat: Left ear no scars, no lesions, no masses. Right ear no scars, no lesions, no masses. Nose no scars, no lesions, no masses. Normal hearing. Normal lips.    Musculoskeletal: Normal gait and station of head and neck.          Complexity of Data:   Records Review:  POC Tool  Urine Test Review:  Urinalysis  X-Ray Review: C.T. Abdomen/Pelvis: Reviewed Films. Reviewed Report. Discussed With Patient.     Notes:  08/09/2019: BUN 34, creatinine 0.8   PROCEDURES:    Urinalysis w/Scope  Dipstick Dipstick Cont'd Micro  Color: Red Bilirubin: Neg mg/dL WBC/hpf: 6 - 10/hpf  Appearance: Cloudy Ketones: Neg mg/dL RBC/hpf: >60/hpf  Specific Gravity: 1.025 Blood: 3+ ery/uL Bacteria: NS (Not Seen)  pH: 7.0 Protein: 3+ mg/dL Cystals: NS (Not Seen)  Glucose: Neg mg/dL Urobilinogen: 0.2 mg/dL Casts: NS (Not Seen)   Nitrites: Neg  Trichomonas: Not Present   Leukocyte Esterase: 2+ leu/uL Mucous: Not Present    Epithelial Cells: 0 - 5/hpf    Yeast: NS (Not Seen)    Sperm: Not Present   Notes: microscopic not concentrated     ASSESSMENT:     ICD-10 Details  1 GU:  Ureteral calculus - N20.1 Acute, Systemic Symptoms - Patient is scheduled for definitive management of stone with ureteroscopy/laser lithotripsy status stent exchange next week. She has completed her antibiotic course for her urosepsis and is feeling okay. She does have some discomfort with the stent as well as hematuria. Urinalysis today without signs of infection. Risks and benefits of the procedure discussed with the patient and she who would like to proceed. She understands that she will have the stent for approximately 3-5 days after the procedure.

## 2019-08-15 NOTE — Progress Notes (Signed)
Lab result: BUN: 30

## 2019-08-16 ENCOUNTER — Other Ambulatory Visit (HOSPITAL_COMMUNITY)
Admission: RE | Admit: 2019-08-16 | Discharge: 2019-08-16 | Disposition: A | Discharge status: Home / Self Care | Payer: Medicare Other | Source: Ambulatory Visit | Attending: Urology | Admitting: Urology

## 2019-08-16 ENCOUNTER — Other Ambulatory Visit (HOSPITAL_COMMUNITY): Payer: Medicare Other

## 2019-08-16 DIAGNOSIS — Z01812 Encounter for preprocedural laboratory examination: Secondary | ICD-10-CM | POA: Insufficient documentation

## 2019-08-16 DIAGNOSIS — Z20822 Contact with and (suspected) exposure to covid-19: Secondary | ICD-10-CM | POA: Diagnosis not present

## 2019-08-16 LAB — SARS CORONAVIRUS 2 (TAT 6-24 HRS): SARS Coronavirus 2: NEGATIVE

## 2019-08-19 ENCOUNTER — Encounter (HOSPITAL_COMMUNITY): Payer: Self-pay | Admitting: Urology

## 2019-08-19 NOTE — Anesthesia Preprocedure Evaluation (Addendum)
Anesthesia Evaluation  Patient identified by MRN, date of birth, ID band Patient awake    Reviewed: Allergy & Precautions, NPO status , Patient's Chart, lab work & pertinent test results  Airway Mallampati: II  TM Distance: >3 FB Neck ROM: Full    Dental no notable dental hx. (+) Teeth Intact, Dental Advisory Given   Pulmonary asthma ,    Pulmonary exam normal breath sounds clear to auscultation       Cardiovascular hypertension, + CAD and + DOE  Normal cardiovascular exam+ dysrhythmias  Rhythm:Regular Rate:Normal     Neuro/Psych    GI/Hepatic Neg liver ROS, GERD  ,  Endo/Other  negative endocrine ROS  Renal/GU Renal disease  negative genitourinary   Musculoskeletal  (+) Arthritis ,   Abdominal   Peds  Hematology  (+) anemia , Lab Results      Component                Value               Date                      WBC                      7.5                 08/15/2019                HGB                      11.5 (L)            08/15/2019                HCT                      35.9 (L)            08/15/2019                MCV                      101.4 (H)           08/15/2019                PLT                      273                 08/15/2019                         Anesthesia Other Findings   Reproductive/Obstetrics                            Anesthesia Physical Anesthesia Plan  ASA: III  Anesthesia Plan: General   Post-op Pain Management:    Induction: Intravenous  PONV Risk Score and Plan: 4 or greater and Treatment may vary due to age or medical condition, Ondansetron, Dexamethasone and Midazolam  Airway Management Planned: LMA  Additional Equipment: None  Intra-op Plan:   Post-operative Plan:   Informed Consent: I have reviewed the patients History and Physical, chart, labs and discussed the procedure including the risks, benefits and alternatives for the proposed  anesthesia with the patient or authorized  representative who has indicated his/her understanding and acceptance.     Dental advisory given  Plan Discussed with: CRNA  Anesthesia Plan Comments:        Anesthesia Quick Evaluation

## 2019-08-19 NOTE — Progress Notes (Signed)
Spoke with [employer] in [place] [facility].  She will call pt with time change for surgery on 08/20/19.

## 2019-08-19 NOTE — Progress Notes (Signed)
Pt aware of surgical time change for 08/20/2019. Pt aware to arrive at [facility] admitting office 0730.

## 2019-08-20 ENCOUNTER — Ambulatory Visit (HOSPITAL_COMMUNITY): Payer: Medicare Other

## 2019-08-20 ENCOUNTER — Encounter (HOSPITAL_COMMUNITY): Payer: Self-pay | Admitting: Urology

## 2019-08-20 ENCOUNTER — Ambulatory Visit (HOSPITAL_COMMUNITY)
Admission: RE | Admit: 2019-08-20 | Discharge: 2019-08-20 | Disposition: A | Discharge status: Home / Self Care | Payer: Medicare Other | Attending: Urology | Admitting: Urology

## 2019-08-20 ENCOUNTER — Ambulatory Visit (HOSPITAL_COMMUNITY): Payer: Medicare Other | Admitting: Anesthesiology

## 2019-08-20 ENCOUNTER — Ambulatory Visit (HOSPITAL_COMMUNITY): Payer: Medicare Other | Admitting: Physician Assistant

## 2019-08-20 ENCOUNTER — Encounter (HOSPITAL_COMMUNITY)
Admission: RE | Disposition: A | Discharge status: Home / Self Care | Payer: Self-pay | Source: Home / Self Care | Attending: Urology

## 2019-08-20 DIAGNOSIS — E7800 Pure hypercholesterolemia, unspecified: Secondary | ICD-10-CM | POA: Diagnosis not present

## 2019-08-20 DIAGNOSIS — Z8616 Personal history of COVID-19: Secondary | ICD-10-CM | POA: Diagnosis not present

## 2019-08-20 DIAGNOSIS — Z96649 Presence of unspecified artificial hip joint: Secondary | ICD-10-CM | POA: Diagnosis not present

## 2019-08-20 DIAGNOSIS — Z7982 Long term (current) use of aspirin: Secondary | ICD-10-CM | POA: Diagnosis not present

## 2019-08-20 DIAGNOSIS — I10 Essential (primary) hypertension: Secondary | ICD-10-CM | POA: Diagnosis not present

## 2019-08-20 DIAGNOSIS — K219 Gastro-esophageal reflux disease without esophagitis: Secondary | ICD-10-CM | POA: Diagnosis not present

## 2019-08-20 DIAGNOSIS — Z79899 Other long term (current) drug therapy: Secondary | ICD-10-CM | POA: Insufficient documentation

## 2019-08-20 DIAGNOSIS — N201 Calculus of ureter: Secondary | ICD-10-CM | POA: Diagnosis present

## 2019-08-20 DIAGNOSIS — I2510 Atherosclerotic heart disease of native coronary artery without angina pectoris: Secondary | ICD-10-CM | POA: Diagnosis not present

## 2019-08-20 HISTORY — PX: HOLMIUM LASER APPLICATION: SHX5852

## 2019-08-20 HISTORY — PX: CYSTOSCOPY WITH RETROGRADE PYELOGRAM, URETEROSCOPY AND STENT PLACEMENT: SHX5789

## 2019-08-20 SURGERY — CYSTOURETEROSCOPY, WITH RETROGRADE PYELOGRAM AND STENT INSERTION
Anesthesia: General | Laterality: Left

## 2019-08-20 MED ORDER — DEXAMETHASONE SODIUM PHOSPHATE 4 MG/ML IJ SOLN
INTRAMUSCULAR | Status: DC | PRN
  Administered 2019-08-20: 10 mg via INTRAVENOUS

## 2019-08-20 MED ORDER — FENTANYL CITRATE (PF) 100 MCG/2ML IJ SOLN
25.0000 ug | INTRAMUSCULAR | Status: DC | PRN
  Administered 2019-08-20 (×2): 25 ug via INTRAVENOUS
  Administered 2019-08-20: 50 ug via INTRAVENOUS

## 2019-08-20 MED ORDER — ACETAMINOPHEN 10 MG/ML IV SOLN: 1000.0000 mg | Freq: Once | INTRAVENOUS | Status: DC | PRN

## 2019-08-20 MED ORDER — SODIUM CHLORIDE 0.9 % IV SOLN
2.0000 g | INTRAVENOUS | Status: AC
  Administered 2019-08-20: 2 g via INTRAVENOUS
  Filled 2019-08-20: qty 20

## 2019-08-20 MED ORDER — SODIUM CHLORIDE 0.9 % IR SOLN
Status: DC | PRN
  Administered 2019-08-20 (×2): 3000 mL

## 2019-08-20 MED ORDER — MIDAZOLAM HCL 2 MG/2ML IJ SOLN
INTRAMUSCULAR | Status: AC
  Filled 2019-08-20: qty 2

## 2019-08-20 MED ORDER — ONDANSETRON HCL 4 MG/2ML IJ SOLN: 4.0000 mg | Freq: Once | INTRAMUSCULAR | Status: DC | PRN

## 2019-08-20 MED ORDER — CHLORHEXIDINE GLUCONATE 0.12 % MT SOLN
15.0000 mL | Freq: Once | OROMUCOSAL | Status: AC
  Administered 2019-08-20: 15 mL via OROMUCOSAL

## 2019-08-20 MED ORDER — EPHEDRINE 5 MG/ML INJ
INTRAVENOUS | Status: AC
  Filled 2019-08-20: qty 20

## 2019-08-20 MED ORDER — ORAL CARE MOUTH RINSE: 15.0000 mL | Freq: Once | OROMUCOSAL | Status: AC

## 2019-08-20 MED ORDER — LIDOCAINE 2% (20 MG/ML) 5 ML SYRINGE
INTRAMUSCULAR | Status: AC
  Filled 2019-08-20: qty 25

## 2019-08-20 MED ORDER — LIDOCAINE 2% (20 MG/ML) 5 ML SYRINGE
INTRAMUSCULAR | Status: DC | PRN
  Administered 2019-08-20: 60 mg via INTRAVENOUS

## 2019-08-20 MED ORDER — FENTANYL CITRATE (PF) 100 MCG/2ML IJ SOLN
INTRAMUSCULAR | Status: AC
  Filled 2019-08-20: qty 2

## 2019-08-20 MED ORDER — LACTATED RINGERS IV SOLN: INTRAVENOUS | Status: DC

## 2019-08-20 MED ORDER — ONDANSETRON HCL 4 MG/2ML IJ SOLN
INTRAMUSCULAR | Status: AC
  Filled 2019-08-20: qty 10

## 2019-08-20 MED ORDER — PROPOFOL 10 MG/ML IV BOLUS
INTRAVENOUS | Status: AC
  Filled 2019-08-20: qty 40

## 2019-08-20 MED ORDER — CEPHALEXIN 500 MG PO CAPS: 500.0000 mg | ORAL_CAPSULE | Freq: Once | ORAL | 0 refills | Status: AC

## 2019-08-20 MED ORDER — FENTANYL CITRATE (PF) 100 MCG/2ML IJ SOLN
INTRAMUSCULAR | Status: DC | PRN
  Administered 2019-08-20 (×3): 25 ug via INTRAVENOUS

## 2019-08-20 MED ORDER — PROPOFOL 10 MG/ML IV BOLUS
INTRAVENOUS | Status: DC | PRN
  Administered 2019-08-20: 100 mg via INTRAVENOUS

## 2019-08-20 MED ORDER — ONDANSETRON HCL 4 MG/2ML IJ SOLN
INTRAMUSCULAR | Status: DC | PRN
  Administered 2019-08-20: 4 mg via INTRAVENOUS

## 2019-08-20 MED ORDER — DEXAMETHASONE SODIUM PHOSPHATE 10 MG/ML IJ SOLN
INTRAMUSCULAR | Status: AC
  Filled 2019-08-20: qty 5

## 2019-08-20 SURGICAL SUPPLY — 20 items
BAG URO CATCHER STRL LF (MISCELLANEOUS) ×2 IMPLANT
BASKET ZERO TIP NITINOL 2.4FR (BASKET) ×1 IMPLANT
BSKT STON RTRVL ZERO TP 2.4FR (BASKET) ×1
CATH URET 5FR 28IN OPEN ENDED (CATHETERS) ×2 IMPLANT
CLOTH BEACON ORANGE TIMEOUT ST (SAFETY) ×2 IMPLANT
DRSG TEGADERM 2-3/8X2-3/4 SM (GAUZE/BANDAGES/DRESSINGS) ×2 IMPLANT
EXTRACTOR STONE 1.7FRX115CM (UROLOGICAL SUPPLIES) IMPLANT
FIBER LASER FLEXIVA 365 (UROLOGICAL SUPPLIES) ×1 IMPLANT
FIBER LASER TRAC TIP (UROLOGICAL SUPPLIES) ×2 IMPLANT
GLOVE BIO SURGEON STRL SZ 6.5 (GLOVE) ×2 IMPLANT
GOWN STRL REUS W/TWL LRG LVL3 (GOWN DISPOSABLE) ×2 IMPLANT
GUIDEWIRE STR DUAL SENSOR (WIRE) ×3 IMPLANT
KIT TURNOVER KIT A (KITS) IMPLANT
MANIFOLD NEPTUNE II (INSTRUMENTS) ×2 IMPLANT
PACK CYSTO (CUSTOM PROCEDURE TRAY) ×2 IMPLANT
SHEATH URETERAL 12FRX28CM (UROLOGICAL SUPPLIES) ×1 IMPLANT
SHEATH URETERAL 12FRX35CM (MISCELLANEOUS) IMPLANT
STENT URET 6FRX24 CONTOUR (STENTS) ×1 IMPLANT
TUBING CONNECTING 10 (TUBING) ×2 IMPLANT
TUBING UROLOGY SET (TUBING) ×2 IMPLANT

## 2019-08-20 NOTE — Anesthesia Postprocedure Evaluation (Signed)
Anesthesia Post Note  Patient: [name]  Procedure(s) Performed: CYSTOSCOPY, URETEROSCOPY AND STENT PLACEMENT (Left ) HOLMIUM LASER APPLICATION (Left )     Patient location during evaluation: PACU Anesthesia Type: General Level of consciousness: awake and alert Pain management: pain level controlled Vital Signs Assessment: post-procedure vital signs reviewed and stable Respiratory status: spontaneous breathing, nonlabored ventilation, respiratory function stable and patient connected to nasal cannula oxygen Cardiovascular status: blood pressure returned to baseline and stable Postop Assessment: no apparent nausea or vomiting Anesthetic complications: no   No complications documented.  Last Vitals:  Vitals:   08/20/19 1015 08/20/19 1030  BP: (!) 143/50 (!) 132/58  Pulse: (!) 55 60  Resp: 10 10  Temp:    SpO2: 100% 92%    Last Pain:  Vitals:   08/20/19 1030  TempSrc:   PainSc: 5                  Barnet Glasgow

## 2019-08-20 NOTE — Discharge Instructions (Signed)
DISCHARGE INSTRUCTIONS FOR KIDNEY STONE/URETERAL STENT   MEDICATIONS:  1. Resume all your other meds from home - except do not take any extra narcotic pain meds that you may have at home.  2. Take 1 tab keflex 1 hour prior to removing your stent  ACTIVITY:  1. No strenuous activity x 1week  2. No driving while on narcotic pain medications  3. Drink plenty of water  4. Continue to walk at home - you can still get blood clots when you are at home, so keep active, but don't over do it.  5. May return to work/school tomorrow or when you feel ready   BATHING:  1. You can shower and we recommend daily showers  2. You have a string coming from your urethra: The stent string is attached to your ureteral stent. Do not pull on this.   SIGNS/SYMPTOMS TO CALL:  Please call us if you have a fever greater than 101.5, uncontrolled nausea/vomiting, uncontrolled pain, dizziness, unable to urinate, bloody urine, chest pain, shortness of breath, leg swelling, leg pain, redness around wound, drainage from wound, or any other concerns or questions.   You can reach [place] at [phone].   FOLLOW-UP:  1. You have a string attached to your stent, you may remove it on Friday, July 2. To do this, pull the strings until the stents are completely removed. You may feel an odd sensation in your back. Please take your antibiotic 1 hour before removing stent.

## 2019-08-20 NOTE — Transfer of Care (Signed)
Immediate Anesthesia Transfer of Care Note  Patient: [name]  Procedure(s) Performed: CYSTOSCOPY, URETEROSCOPY AND STENT PLACEMENT (Left ) HOLMIUM LASER APPLICATION (Left )  Patient Location: PACU  Anesthesia Type:General  Level of Consciousness: awake, alert  and oriented  Airway & Oxygen Therapy: Patient Spontanous Breathing and Patient connected to face mask oxygen  Post-op Assessment: Report given to RN  Post vital signs: Reviewed and stable  Last Vitals:  Vitals Value Taken Time  BP 132/58 08/20/19 1030  Temp 36.4 C 08/20/19 1002  Pulse 58 08/20/19 1031  Resp 9 08/20/19 1031  SpO2 93 % 08/20/19 1031  Vitals shown include unvalidated device data.  Last Pain:  Vitals:   08/20/19 1002  TempSrc:   PainSc: 0-No pain         Complications: No complications documented.

## 2019-08-20 NOTE — Op Note (Signed)
Preoperative diagnosis: left ureteral calculus  Postoperative diagnosis: left ureteral calculus  Procedure:  1. Cystoscopy 2. left ureteroscopy, laser lithotripsy and stone removal 3. left 41F x 24 ureteral stent placement   Surgeon: Jacalyn Lefevre, MD  Anesthesia: General  Complications: None  Intraoperative findings:  1. Normal urethra 2. Normal bladder mucosa with edema around left UO where stent was seen emanating 3. 6 x 24 JJ stent with string at end of case  EBL: Minimal  Specimens: 1. left ureteral calculus  Disposition of specimens: [facility] for stone analysis  Indication: [name] is a 85 y.o.   patient with a 7 mm proximal  left ureteral stone and associated left symptoms. After reviewing the management options for treatment, the patient elected to proceed with the above surgical procedure(s). We have discussed the potential benefits and risks of the procedure, side effects of the proposed treatment, the likelihood of the patient achieving the goals of the procedure, and any potential problems that might occur during the procedure or recuperation. Informed consent has been obtained.   Description of procedure:  The patient was taken to the operating room and general anesthesia was induced.  The patient was placed in the dorsal lithotomy position, prepped and draped in the usual sterile fashion, and preoperative antibiotics were administered. A preoperative time-out was performed.   Cystourethroscopy was performed.  The patient's urethra was examined and was normal.  There was no evidence for any bladder tumors, stones, or other mucosal pathology.    The existing left ureteral stent was seen and grasped with graspers.  It was brought to urethral meatus and a 0.38 sensor guidewire was then advanced up the left ureter into the renal pelvis under fluoroscopic guidance. The 6 Fr semirigid ureteroscope was then advanced into the ureter next to the  guidewire but due to position of the stone it cannot be visualized with the semirigid ureteroscope.  A second wire was then placed through the ureteroscope and up to the kidney.  The ureteroscope was removed.  A ureteral access sheath was then advanced over the second wire with fluoroscopic guidance.  The inner sheath and wire removed.  Flexible ureteroscopy then took place until the stone was encountered in the proximal ureter.   The stone was then fragmented with the 200 micron holmium laser fiber and all stones were then removed from the ureter with an 0 tip basket.  Reinspection of the ureter revealed no remaining visible stones or fragments.   The wire was then backloaded through the cystoscope and a ureteral stent was advance over the wire using Seldinger technique.  The stent was positioned appropriately under fluoroscopic and cystoscopic guidance.  The wire was then removed with an adequate stent curl noted in the renal pelvis as well as in the bladder.  The bladder was then emptied and the procedure ended.  The patient appeared to tolerate the procedure well and without complications.  The patient was able to be awakened and transferred to the recovery unit in satisfactory condition.   Disposition: The tether of the stent was left on and tucked inside the patient's vagina.  Instructions for removing the stent have been provided to the patient. She will remove stent on Friday, July 2.

## 2019-08-20 NOTE — Anesthesia Procedure Notes (Signed)
Procedure Name: LMA Insertion Date/Time: 08/20/2019 8:59 AM Performed by: Lavina Hamman, CRNA Pre-anesthesia Checklist: Patient identified, Emergency Drugs available, Suction available and Patient being monitored Patient Re-evaluated:Patient Re-evaluated prior to induction Oxygen Delivery Method: Circle system utilized Preoxygenation: Pre-oxygenation with 100% oxygen Induction Type: IV induction LMA: LMA inserted LMA Size: 4.0 Number of attempts: 1 Placement Confirmation: positive ETCO2 and breath sounds checked- equal and bilateral Tube secured with: Tape Dental Injury: Teeth and Oropharynx as per pre-operative assessment  Comments: Inserted by Frutoso Chase

## 2019-08-20 NOTE — Interval H&P Note (Signed)
History and Physical Interval Note:  08/20/2019 8:14 AM  [name]  has presented today for surgery, with the diagnosis of LEFT URETERAL CALCULUS.  The various methods of treatment have been discussed with the patient and family. After consideration of risks, benefits and other options for treatment, the patient has consented to  Procedure(s) with comments: CYSTOSCOPY WITH RETROGRADE PYELOGRAM, URETEROSCOPY AND STENT PLACEMENT (Left) - 1 HR HOLMIUM LASER APPLICATION (Left) as a surgical intervention.  The patient's history has been reviewed, patient examined, no change in status, stable for surgery.  I have reviewed the patient's chart and labs.  Questions were answered to the patient's satisfaction.     [name] D Philopater Mucha

## 2019-08-21 ENCOUNTER — Encounter (HOSPITAL_COMMUNITY): Payer: Self-pay | Admitting: Urology

## 2019-09-09 ENCOUNTER — Telehealth: Payer: Self-pay | Admitting: Emergency Medicine

## 2019-09-09 NOTE — Telephone Encounter (Signed)
Called pt back to see if she was prescribed any meds when she went to [facility] or if they did a cxr while she was there.   Unable to reach pt. Left message for her to return call.

## 2019-09-09 NOTE — Telephone Encounter (Signed)
Pt returned call. Asked pt if a cxr was performed and she stated that they did not. Pt stated that she was prescribed doxycycline 100mg  for her to take bid. Pt started the abx Saturday 7/17 after she got meds. Pt stated that they prescribed a 7-day course of the abx.  Pt stated that she did pick up delsym cough syrup when at pharmacy which she has been taking.

## 2019-09-09 NOTE — Telephone Encounter (Signed)
Called and spoke with pt who stated she went to [facility] 2 days ago due to cough with yellow phlegm and was told that it was a sinus infection. Pt states that she does have some chest congestion but denies any complaints of wheezing. Pt denies any complaints of fever.  Pt has had to use her albuterol inhaler at least 2-3 times daily which she states has given her some relief. Pt is taking all other meds as prescribed.  Even though she was told by [facility] that she had a sinus infection, she wants reassurance from RB due to her history of pna.  Dr. [name], please advise.

## 2019-09-09 NOTE — Telephone Encounter (Signed)
What medications did they give her at [facility]? Did they do a CXR?

## 2019-09-09 NOTE — Telephone Encounter (Signed)
I agree with the doyxcycline. This is a good choice and would cover her even if there was some PNA present.

## 2019-09-09 NOTE — Telephone Encounter (Signed)
Called and spoke with pt letting her know the info stated by RB and she verbalized understanding. Nothing further needed. 

## 2019-09-11 ENCOUNTER — Telehealth: Payer: Self-pay | Admitting: Emergency Medicine

## 2019-09-11 DIAGNOSIS — J069 Acute upper respiratory infection, unspecified: Secondary | ICD-10-CM | POA: Insufficient documentation

## 2019-09-11 DIAGNOSIS — R059 Cough, unspecified: Secondary | ICD-10-CM

## 2019-09-11 NOTE — Telephone Encounter (Signed)
lmom to make the patient aware asked her to call back with questions.

## 2019-09-11 NOTE — Telephone Encounter (Signed)
Patient that even though she has has been on ABx since Monday she is still taking them. But she is feeling worse. She has postnasal drip and runny nose. Coughing up yellow/green mucus. She is wheezing.  Denies fevers chills N/V/D .  I have made the only appointment available with beth for Monday.  Beth can you advise if this is okay to wait till Monday or if you other recommendations.  I did advise to try to get in with PCP sooner or return to urgent care.

## 2019-09-11 NOTE — Telephone Encounter (Signed)
No further antibiotics. Have her get CXR prior to visit that day. Recommend Mucinex twice daily, flonase nasal spray daily.

## 2019-09-12 NOTE — Telephone Encounter (Signed)
Called and spoke with pt letting her know the info stated by [facility]. Pt stated she did call pcp and had an OV scheduled with them and was placed on cefdinir as well as prednisone. Pt had a cxr performed at PCP.  Pt wanted to cancel the appt that was scheduled for her to see [facility] 7/26 since she did get in to see PCP. I have cancelled that appt. Pt did not have a f/u scheduled to see RB so I have made pt an appt for her to see him in September. Nothing further needed.

## 2019-09-16 ENCOUNTER — Ambulatory Visit: Payer: Medicare Other | Admitting: Primary Care

## 2019-09-18 ENCOUNTER — Telehealth: Payer: Self-pay | Admitting: Emergency Medicine

## 2019-09-18 ENCOUNTER — Emergency Department (HOSPITAL_COMMUNITY): Payer: Medicare Other

## 2019-09-18 ENCOUNTER — Other Ambulatory Visit: Payer: Self-pay

## 2019-09-18 ENCOUNTER — Inpatient Hospital Stay (HOSPITAL_COMMUNITY)
Admission: EM | Admit: 2019-09-18 | Discharge: 2019-09-26 | DRG: 194 | Disposition: A | Discharge status: Home Health | Payer: Medicare Other | Attending: Internal Medicine | Admitting: Internal Medicine

## 2019-09-18 ENCOUNTER — Encounter (HOSPITAL_COMMUNITY): Payer: Self-pay | Admitting: Family Medicine

## 2019-09-18 ENCOUNTER — Observation Stay (HOSPITAL_COMMUNITY): Payer: Medicare Other

## 2019-09-18 DIAGNOSIS — K219 Gastro-esophageal reflux disease without esophagitis: Secondary | ICD-10-CM | POA: Diagnosis present

## 2019-09-18 DIAGNOSIS — Z20822 Contact with and (suspected) exposure to covid-19: Secondary | ICD-10-CM | POA: Diagnosis present

## 2019-09-18 DIAGNOSIS — J189 Pneumonia, unspecified organism: Secondary | ICD-10-CM | POA: Diagnosis not present

## 2019-09-18 DIAGNOSIS — Z7982 Long term (current) use of aspirin: Secondary | ICD-10-CM

## 2019-09-18 DIAGNOSIS — Z7951 Long term (current) use of inhaled steroids: Secondary | ICD-10-CM

## 2019-09-18 DIAGNOSIS — I4891 Unspecified atrial fibrillation: Secondary | ICD-10-CM

## 2019-09-18 DIAGNOSIS — E782 Mixed hyperlipidemia: Secondary | ICD-10-CM

## 2019-09-18 DIAGNOSIS — I10 Essential (primary) hypertension: Secondary | ICD-10-CM | POA: Diagnosis present

## 2019-09-18 DIAGNOSIS — R739 Hyperglycemia, unspecified: Secondary | ICD-10-CM | POA: Diagnosis present

## 2019-09-18 DIAGNOSIS — I471 Supraventricular tachycardia, unspecified: Secondary | ICD-10-CM | POA: Diagnosis present

## 2019-09-18 DIAGNOSIS — J181 Lobar pneumonia, unspecified organism: Secondary | ICD-10-CM | POA: Diagnosis present

## 2019-09-18 DIAGNOSIS — Z79899 Other long term (current) drug therapy: Secondary | ICD-10-CM

## 2019-09-18 DIAGNOSIS — R0602 Shortness of breath: Secondary | ICD-10-CM | POA: Diagnosis not present

## 2019-09-18 DIAGNOSIS — J690 Pneumonitis due to inhalation of food and vomit: Secondary | ICD-10-CM | POA: Diagnosis present

## 2019-09-18 DIAGNOSIS — I2510 Atherosclerotic heart disease of native coronary artery without angina pectoris: Secondary | ICD-10-CM | POA: Diagnosis present

## 2019-09-18 DIAGNOSIS — J470 Bronchiectasis with acute lower respiratory infection: Secondary | ICD-10-CM | POA: Diagnosis present

## 2019-09-18 DIAGNOSIS — I480 Paroxysmal atrial fibrillation: Secondary | ICD-10-CM | POA: Diagnosis present

## 2019-09-18 DIAGNOSIS — B370 Candidal stomatitis: Secondary | ICD-10-CM | POA: Diagnosis present

## 2019-09-18 DIAGNOSIS — Z7952 Long term (current) use of systemic steroids: Secondary | ICD-10-CM

## 2019-09-18 DIAGNOSIS — R0902 Hypoxemia: Secondary | ICD-10-CM | POA: Diagnosis present

## 2019-09-18 DIAGNOSIS — T380X5A Adverse effect of glucocorticoids and synthetic analogues, initial encounter: Secondary | ICD-10-CM | POA: Diagnosis present

## 2019-09-18 DIAGNOSIS — E785 Hyperlipidemia, unspecified: Secondary | ICD-10-CM | POA: Diagnosis present

## 2019-09-18 DIAGNOSIS — Z96612 Presence of left artificial shoulder joint: Secondary | ICD-10-CM | POA: Diagnosis present

## 2019-09-18 DIAGNOSIS — H6122 Impacted cerumen, left ear: Secondary | ICD-10-CM | POA: Diagnosis present

## 2019-09-18 DIAGNOSIS — D860 Sarcoidosis of lung: Secondary | ICD-10-CM | POA: Diagnosis present

## 2019-09-18 HISTORY — DX: Pneumonia, unspecified organism: J18.9

## 2019-09-18 LAB — CBC WITH DIFFERENTIAL/PLATELET
Abs Immature Granulocytes: 0.72 10*3/uL — ABNORMAL HIGH (ref 0.00–0.07)
Basophils Absolute: 0.1 10*3/uL (ref 0.0–0.1)
Basophils Relative: 0 %
Eosinophils Absolute: 0 10*3/uL (ref 0.0–0.5)
Eosinophils Relative: 0 %
HCT: 41.4 % (ref 36.0–46.0)
Hemoglobin: 13.5 g/dL (ref 12.0–15.0)
Immature Granulocytes: 3 %
Lymphocytes Relative: 3 %
Lymphs Abs: 0.9 10*3/uL (ref 0.7–4.0)
MCH: 31.5 pg (ref 26.0–34.0)
MCHC: 32.6 g/dL (ref 30.0–36.0)
MCV: 96.7 fL (ref 80.0–100.0)
Monocytes Absolute: 0.7 10*3/uL (ref 0.1–1.0)
Monocytes Relative: 3 %
Neutro Abs: 24.8 10*3/uL — ABNORMAL HIGH (ref 1.7–7.7)
Neutrophils Relative %: 91 %
Platelets: 242 10*3/uL (ref 150–400)
RBC: 4.28 MIL/uL (ref 3.87–5.11)
RDW: 16.5 % — ABNORMAL HIGH (ref 11.5–15.5)
WBC: 27.1 10*3/uL — ABNORMAL HIGH (ref 4.0–10.5)
nRBC: 0 % (ref 0.0–0.2)

## 2019-09-18 LAB — URINALYSIS, ROUTINE W REFLEX MICROSCOPIC
Bilirubin Urine: NEGATIVE
Glucose, UA: NEGATIVE mg/dL
Hgb urine dipstick: NEGATIVE
Ketones, ur: NEGATIVE mg/dL
Leukocytes,Ua: NEGATIVE
Nitrite: NEGATIVE
Protein, ur: NEGATIVE mg/dL
Specific Gravity, Urine: 1.034 — ABNORMAL HIGH (ref 1.005–1.030)
pH: 7 (ref 5.0–8.0)

## 2019-09-18 LAB — COMPREHENSIVE METABOLIC PANEL
ALT: 18 U/L (ref 0–44)
AST: 15 U/L (ref 15–41)
Albumin: 3.4 g/dL — ABNORMAL LOW (ref 3.5–5.0)
Alkaline Phosphatase: 90 U/L (ref 38–126)
Anion gap: 10 (ref 5–15)
BUN: 19 mg/dL (ref 8–23)
CO2: 23 mmol/L (ref 22–32)
Calcium: 8.6 mg/dL — ABNORMAL LOW (ref 8.9–10.3)
Chloride: 100 mmol/L (ref 98–111)
Creatinine, Ser: 0.56 mg/dL (ref 0.44–1.00)
GFR calc Af Amer: >60 mL/min (ref >60)
GFR calc non Af Amer: >60 mL/min (ref >60)
Glucose, Bld: 161 mg/dL — ABNORMAL HIGH (ref 70–99)
Potassium: 4.7 mmol/L (ref 3.5–5.1)
Sodium: 133 mmol/L — ABNORMAL LOW (ref 135–145)
Total Bilirubin: 0.7 mg/dL (ref 0.3–1.2)
Total Protein: 6.1 g/dL — ABNORMAL LOW (ref 6.5–8.1)

## 2019-09-18 LAB — LACTIC ACID, PLASMA
Lactic Acid, Venous: 0.9 mmol/L (ref 0.5–1.9)
Lactic Acid, Venous: 0.8 mmol/L (ref 0.5–1.9)

## 2019-09-18 LAB — SARS CORONAVIRUS 2 BY RT PCR (HOSPITAL ORDER, PERFORMED IN ~~LOC~~ HOSPITAL LAB): SARS Coronavirus 2: NEGATIVE

## 2019-09-18 LAB — PROTIME-INR
INR: 1 (ref 0.8–1.2)
Prothrombin Time: 13.1 seconds (ref 11.4–15.2)

## 2019-09-18 LAB — HEMOGLOBIN A1C
Hgb A1c MFr Bld: 5.9 % — ABNORMAL HIGH (ref 4.8–5.6)
Mean Plasma Glucose: 122.63 mg/dL

## 2019-09-18 LAB — GLUCOSE, CAPILLARY: Glucose-Capillary: 273 mg/dL — ABNORMAL HIGH (ref 70–99)

## 2019-09-18 LAB — APTT: aPTT: 24 seconds (ref 24–36)

## 2019-09-18 MED ORDER — DOCUSATE SODIUM 100 MG PO CAPS
100.0000 mg | ORAL_CAPSULE | Freq: Every day | ORAL | Status: DC
  Administered 2019-09-18 – 2019-09-23 (×6): 100 mg via ORAL
  Filled 2019-09-18 (×6): qty 1

## 2019-09-18 MED ORDER — BENZONATATE 100 MG PO CAPS
100.0000 mg | ORAL_CAPSULE | Freq: Four times a day (QID) | ORAL | 0 refills | Status: DC | PRN
Start: 2019-09-18 — End: 2020-05-01

## 2019-09-18 MED ORDER — ROSUVASTATIN CALCIUM 10 MG PO TABS
10.0000 mg | ORAL_TABLET | ORAL | Status: DC
  Administered 2019-09-20 – 2019-09-26 (×4): 10 mg via ORAL
  Filled 2019-09-18 (×5): qty 1

## 2019-09-18 MED ORDER — MELATONIN 5 MG PO TABS
10.0000 mg | ORAL_TABLET | Freq: Every day | ORAL | Status: DC
  Administered 2019-09-18 – 2019-09-25 (×8): 10 mg via ORAL
  Filled 2019-09-18 (×8): qty 2

## 2019-09-18 MED ORDER — PANTOPRAZOLE SODIUM 40 MG PO TBEC
40.0000 mg | DELAYED_RELEASE_TABLET | Freq: Every day | ORAL | Status: DC
  Administered 2019-09-19 – 2019-09-26 (×8): 40 mg via ORAL
  Filled 2019-09-18 (×8): qty 1

## 2019-09-18 MED ORDER — DILTIAZEM HCL ER COATED BEADS 180 MG PO CP24
180.0000 mg | ORAL_CAPSULE | Freq: Every day | ORAL | Status: DC
  Administered 2019-09-18 – 2019-09-23 (×6): 180 mg via ORAL
  Filled 2019-09-18 (×6): qty 1

## 2019-09-18 MED ORDER — ENSURE ENLIVE PO LIQD
237.0000 mL | Freq: Two times a day (BID) | ORAL | Status: DC
  Administered 2019-09-19 – 2019-09-26 (×13): 237 mL via ORAL

## 2019-09-18 MED ORDER — INSULIN ASPART 100 UNIT/ML ~~LOC~~ SOLN
0.0000 [IU] | Freq: Three times a day (TID) | SUBCUTANEOUS | Status: DC
  Administered 2019-09-19 (×2): 2 [IU] via SUBCUTANEOUS
  Administered 2019-09-20: 1 [IU] via SUBCUTANEOUS
  Administered 2019-09-20: 5 [IU] via SUBCUTANEOUS
  Administered 2019-09-20: 7 [IU] via SUBCUTANEOUS
  Administered 2019-09-21 (×2): 2 [IU] via SUBCUTANEOUS
  Administered 2019-09-21: 5 [IU] via SUBCUTANEOUS
  Administered 2019-09-22: 3 [IU] via SUBCUTANEOUS
  Administered 2019-09-22: 7 [IU] via SUBCUTANEOUS
  Administered 2019-09-22: 1 [IU] via SUBCUTANEOUS
  Administered 2019-09-23 (×2): 3 [IU] via SUBCUTANEOUS
  Administered 2019-09-24: 2 [IU] via SUBCUTANEOUS
  Administered 2019-09-24 (×2): 3 [IU] via SUBCUTANEOUS
  Administered 2019-09-25: 7 [IU] via SUBCUTANEOUS
  Administered 2019-09-26: 2 [IU] via SUBCUTANEOUS
  Filled 2019-09-18: qty 0.09

## 2019-09-18 MED ORDER — SODIUM CHLORIDE 0.9 % IV SOLN: 500.0000 mg | INTRAVENOUS | Status: DC

## 2019-09-18 MED ORDER — ASPIRIN EC 81 MG PO TBEC
81.0000 mg | DELAYED_RELEASE_TABLET | Freq: Every day | ORAL | Status: DC
  Administered 2019-09-18 – 2019-09-26 (×9): 81 mg via ORAL
  Filled 2019-09-18 (×9): qty 1

## 2019-09-18 MED ORDER — POLYETHYLENE GLYCOL 3350 17 G PO PACK
17.0000 g | PACK | Freq: Every day | ORAL | Status: DC
  Administered 2019-09-18 – 2019-09-26 (×8): 17 g via ORAL
  Filled 2019-09-18 (×9): qty 1

## 2019-09-18 MED ORDER — SODIUM CHLORIDE (PF) 0.9 % IJ SOLN
INTRAMUSCULAR | Status: AC
  Filled 2019-09-18: qty 50

## 2019-09-18 MED ORDER — MOMETASONE FURO-FORMOTEROL FUM 200-5 MCG/ACT IN AERO
2.0000 | INHALATION_SPRAY | Freq: Two times a day (BID) | RESPIRATORY_TRACT | Status: DC
  Administered 2019-09-18 – 2019-09-26 (×16): 2 via RESPIRATORY_TRACT
  Filled 2019-09-18: qty 8.8

## 2019-09-18 MED ORDER — IPRATROPIUM-ALBUTEROL 0.5-2.5 (3) MG/3ML IN SOLN
3.0000 mL | Freq: Four times a day (QID) | RESPIRATORY_TRACT | Status: DC
  Administered 2019-09-18: 3 mL via RESPIRATORY_TRACT
  Filled 2019-09-18 (×2): qty 3

## 2019-09-18 MED ORDER — ENOXAPARIN SODIUM 40 MG/0.4ML ~~LOC~~ SOLN
40.0000 mg | SUBCUTANEOUS | Status: DC
  Administered 2019-09-18 – 2019-09-22 (×5): 40 mg via SUBCUTANEOUS
  Filled 2019-09-18 (×5): qty 0.4

## 2019-09-18 MED ORDER — SODIUM CHLORIDE 0.9 % IV SOLN
2.0000 g | INTRAVENOUS | Status: DC
  Filled 2019-09-18: qty 20

## 2019-09-18 MED ORDER — ALBUTEROL SULFATE (2.5 MG/3ML) 0.083% IN NEBU: 2.5000 mg | INHALATION_SOLUTION | RESPIRATORY_TRACT | Status: DC | PRN

## 2019-09-18 MED ORDER — ACETAMINOPHEN 325 MG PO TABS
650.0000 mg | ORAL_TABLET | Freq: Four times a day (QID) | ORAL | Status: DC | PRN
  Administered 2019-09-19 – 2019-09-25 (×6): 650 mg via ORAL
  Filled 2019-09-18 (×6): qty 2

## 2019-09-18 MED ORDER — VITAMIN D (ERGOCALCIFEROL) 1.25 MG (50000 UNIT) PO CAPS: 50000.0000 [IU] | ORAL_CAPSULE | ORAL | Status: DC

## 2019-09-18 MED ORDER — AZITHROMYCIN 250 MG PO TABS
500.0000 mg | ORAL_TABLET | Freq: Once | ORAL | Status: AC
  Administered 2019-09-18: 500 mg via ORAL
  Filled 2019-09-18: qty 2

## 2019-09-18 MED ORDER — FLUTICASONE PROPIONATE 50 MCG/ACT NA SUSP
2.0000 | Freq: Two times a day (BID) | NASAL | Status: DC
  Administered 2019-09-19 – 2019-09-26 (×15): 2 via NASAL
  Filled 2019-09-18: qty 16

## 2019-09-18 MED ORDER — LORATADINE 10 MG PO TABS
10.0000 mg | ORAL_TABLET | Freq: Every day | ORAL | Status: DC
  Administered 2019-09-18 – 2019-09-26 (×9): 10 mg via ORAL
  Filled 2019-09-18 (×9): qty 1

## 2019-09-18 MED ORDER — IPRATROPIUM-ALBUTEROL 0.5-2.5 (3) MG/3ML IN SOLN
3.0000 mL | Freq: Two times a day (BID) | RESPIRATORY_TRACT | Status: DC
  Administered 2019-09-19 – 2019-09-25 (×13): 3 mL via RESPIRATORY_TRACT
  Filled 2019-09-18 (×13): qty 3

## 2019-09-18 MED ORDER — IOHEXOL 350 MG/ML SOLN
100.0000 mL | Freq: Once | INTRAVENOUS | Status: AC | PRN
  Administered 2019-09-18: 100 mL via INTRAVENOUS

## 2019-09-18 MED ORDER — CALCIUM CARBONATE ANTACID 500 MG PO CHEW
1.0000 | CHEWABLE_TABLET | Freq: Every day | ORAL | Status: DC | PRN
  Administered 2019-09-19: 200 mg via ORAL
  Filled 2019-09-18 (×2): qty 1

## 2019-09-18 MED ORDER — SODIUM CHLORIDE 0.9 % IV SOLN
2.0000 g | INTRAVENOUS | Status: DC
  Administered 2019-09-18: 2 g via INTRAVENOUS
  Filled 2019-09-18: qty 20

## 2019-09-18 MED ORDER — BENZONATATE 100 MG PO CAPS
200.0000 mg | ORAL_CAPSULE | Freq: Three times a day (TID) | ORAL | Status: DC
  Administered 2019-09-18 – 2019-09-19 (×2): 200 mg via ORAL
  Filled 2019-09-18 (×2): qty 2

## 2019-09-18 NOTE — ED Notes (Signed)
Attempted report . Floor is still in report and requested call back.

## 2019-09-18 NOTE — ED Provider Notes (Signed)
[facility] DEPT Provider Note   CSN: [id number] Arrival date & time: 09/18/19  1547     History Chief Complaint  Patient presents with  . Weakness  . Cough    [name] is a 85 y.o. female.  The history is provided by the patient and medical records. No language interpreter was used.  Weakness Associated symptoms: cough   Cough  [name] is a 85 y.o. female who presents to the Emergency Department complaining of cough. She started feeling poorly a week and a half ago. With cough and headache. Her cough is productive of yellow and green mucus. One week ago she saw her PCP and was started on cefdinir. Two days ago she saw her PCP and the course was prolonged for five additional days. Today she saw her PCP and had labs significant of a wbc 29k, na 130.  She was started on a prednisone taper one week ago.   Denies fever, chest pain, abdominal pain, vomiting/diarrhea. Has sob, nausea, poor appetite.    Lives with daughter and son in law.  No known covid 19 exposures. Has received covid 19 vaccine.      Past Medical History:  Diagnosis Date  . Allergy    SEASONAL  . Anemia   . Arthritis   . Asthma    "slight"   . Baker's cyst of knee, right   . CAD (coronary artery disease)    a. Myoview 10/15 - normal EF 70% // Myoview 11/16: EF 75%, normal perfusion, (there was no blood pressure drop at low level exercise with Lexiscan infusion), low risk study // c. LHC 3/17 - LAD irregs, oLCx 70 (neg FFR), mRCA 30, EF 55-65% >> med Rx // Myoview 07/2018:  EF 73, extracardiac uptake, no significant ischemia (reviewed with Dr. Burt Knack), Low Risk   . Carotid stenosis    a. Carotid US 9/15 - Bilateral 1-39% ICA >> FU 2 years // b. Bilateral ICA 1-39 >> FU prn // Carotid US 06/2018: bilat 1-39; fu prn  . Dyspnea    due to Sarcoidosis. When is windy or humed  . Dysrhythmia    fast heart rate  . Elbow fracture 04/2017   Right, had surgery  .  Esophageal reflux   . Hematochezia   . History of echocardiogram    a. Echo 10/15 - EF 60-65%, no RWMA  . HLD (hyperlipidemia)   . Hx of colonoscopy   . Osteoporosis   . Other chronic pulmonary heart diseases   . Paroxysmal supraventricular tachycardia ([place])   . Pneumonia   . Sarcoidosis     Patient Active Problem List   Diagnosis Date Noted  . Pneumonia 09/18/2019  . Sepsis ([place]) 07/30/2019  . Pyelonephritis 07/29/2019  . Hydronephrosis 07/29/2019  . Sepsis due to urinary tract infection ([place]) 07/18/2019  . S/P reverse total shoulder arthroplasty, 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  . Pre-op evaluation 06/11/2019  . Low back pain 03/13/2019  . Fracture of inferior pubic ramus ([place]) 02/28/2019  . Degeneration of lumbar intervertebral disc 02/14/2019  . Sacral insufficiency fracture 12/12/2018  . Hip fracture ([place]) 12/12/2018  . ETD (Eustachian tube dysfunction), bilateral 11/07/2017  . Chronic nonintractable headache 11/07/2017  . Aftercare 06/06/2017  . Closed fracture of right olecranon process 05/09/2017  . Pain in joint of right elbow 05/02/2017  . Rib lesion 04/20/2017  . Bronchiectasis without complication ([place]) 40/98/1191  . Allergic rhinitis 09/15/2015  . CAD (coronary artery disease) 05/18/2015  . Essential  hypertension 05/18/2015  . Carotid artery disease ([place]) 05/18/2015  . Cough 03/19/2015  . Left foot pain 09/02/2014  . DOE (dyspnea on exertion) 12/26/2013  . Abnormal gait 11/06/2013  . Scoliosis (and kyphoscoliosis), idiopathic 07/30/2013  . 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 Constipation 09/05/2012  . History of sinus tachycardia 09/05/2012  . Closed left subtrochanteric femur fracture S/P Open/closed and reduction, internal medullary fixation  09/05/2012  . Acute posthemorrhagic anemia 08/22/2012  . Lumbar radiculopathy 11/23/2011  . Leg weakness 11/23/2011  . Mitral regurgitation 11/22/2011  . DIARRHEA-PRESUMED INFECTIOUS 08/18/2008  . RECTAL BLEEDING  08/18/2008  . PERSONAL HX COLONIC POLYPS 08/18/2008  . DEGENERATIVE JOINT DISEASE 08/14/2008  . SKIN CANCER, HX OF 08/14/2008  . CARPAL TUNNEL SYNDROME, HX OF 08/14/2008  . SARCOIDOSIS, PULMONARY 08/08/2008  . HLD (hyperlipidemia) 08/08/2008  . Paroxysmal supraventricular tachycardia ([place]) 08/08/2008  . GERD 08/08/2008    Past Surgical History:  Procedure Laterality Date  . arm surgery Right   . BLADDER SURGERY    . CARDIAC CATHETERIZATION N/A 05/20/2015   Procedure: Left Heart Cath and Coronary Angiography;  Surgeon: Sherren Mocha, MD;  Location: [facility];  Service: Cardiovascular;  Laterality: N/A;  . CARPAL TUNNEL RELEASE Left   . COLONOSCOPY    . CYSTOSCOPY W/ RETROGRADES Left 07/30/2019   Procedure: CYSTOSCOPY WITH RETROGRADE PYELOGRAM LEFT STENT PLACEMENT;  Surgeon: Ardis Hughs, MD;  Location: [facility];  Service: Urology;  Laterality: Left;  . CYSTOSCOPY WITH RETROGRADE PYELOGRAM, URETEROSCOPY AND STENT PLACEMENT Left 08/20/2019   Procedure: CYSTOSCOPY, URETEROSCOPY AND STENT PLACEMENT;  Surgeon: Robley Fries, MD;  Location: [facility];  Service: Urology;  Laterality: Left;  1 HR  . FOOT SURGERY Right   . HIP SURGERY Right 2011   full replacement  . HOLMIUM LASER APPLICATION Left 4/00/8676   Procedure: HOLMIUM LASER APPLICATION;  Surgeon: Robley Fries, MD;  Location: [facility];  Service: Urology;  Laterality: Left;  . LEG SURGERY Left May 2014   femur fracture s/p open and closed reduction in [place], Dr. Jimmye Norman  . ORIF ELBOW FRACTURE Right 05/09/2017   Procedure: ORIF right olecranon fracture with repair/reconstruction, ulnar nerve transposition as needed;  Surgeon: Roseanne Kaufman, MD;  Location: [facility];  Service: Orthopedics;  Laterality: Right;  Requests 90 mins  . pelvis fracture    . POLYPECTOMY    . REVERSE SHOULDER ARTHROPLASTY Left 06/13/2019   Procedure: REVERSE SHOULDER ARTHROPLASTY;  Surgeon: Justice Britain, MD;  Location: [facility];  Service:  Orthopedics;  Laterality: Left;  154min  . SHOULDER ARTHROSCOPY W/ ROTATOR CUFF REPAIR Right   . TRAPEZIUM RESECTION Right      OB History    Gravida  1   Para  1   Term  1   Preterm  0   AB  0   Living  1     SAB  0   TAB  0   Ectopic  0   Multiple  0   Live Births  1           Family History  Problem Relation Age of Onset  . Colon cancer Mother 14  . Cancer Mother   . Lung cancer Father   . Heart disease Father   . Arrhythmia Father   . Cancer Father   . Cancer Sister        ovarian  . Heart attack Brother   . Esophageal cancer Neg Hx   . Rectal  cancer Neg Hx   . Stomach cancer Neg Hx   . Stroke Neg Hx     Social History   Tobacco Use  . Smoking status: Never Smoker  . Smokeless tobacco: Never Used  Vaping Use  . Vaping Use: Never used  Substance Use Topics  . Alcohol use: No  . Drug use: No    Home Medications Prior to Admission medications   Medication Sig Start Date End Date Taking? Authorizing Provider  acetaminophen (TYLENOL) 500 MG tablet Take 1,000 mg by mouth every 6 (six) hours as needed for mild pain.    Yes [provider]  albuterol (VENTOLIN HFA) 108 (90 Base) MCG/ACT inhaler Inhale 2 puffs into the lungs every 4 (four) hours as needed for wheezing or shortness of breath. 01/07/19  Yes Martyn Ehrich, NP  aspirin EC 81 MG tablet Take 1 tablet (81 mg total) by mouth daily. 12/23/14  Yes Weaver, [name] T, PA-C  calcium carbonate (TUMS - DOSED IN MG ELEMENTAL CALCIUM) 500 MG chewable tablet Chew 1 tablet by mouth daily as needed for indigestion.    Yes [provider]  cefdinir (OMNICEF) 300 MG capsule Take 300 mg by mouth 2 (two) times daily. 09/11/19  Yes [provider]  cetirizine (ZYRTEC) 10 MG tablet Take 10 mg by mouth daily.   Yes [provider]  denosumab (PROLIA) 60 MG/ML SOLN injection Inject 60 mg into the skin every 6 (six) months. Administer in upper arm, thigh, or abdomen   Yes  [provider]  dextromethorphan (DELSYM) 30 MG/5ML liquid Take 30 mg by mouth 2 (two) times daily as needed for cough.   Yes [provider]  diltiazem (CARDIZEM CD) 180 MG 24 hr capsule Take 180 mg by mouth at bedtime.  05/08/19  Yes [provider]  docusate sodium (COLACE) 50 MG capsule Take 100 mg by mouth at bedtime.    Yes [provider]  fluticasone (FLONASE) 50 MCG/ACT nasal spray Place 2 sprays into both nostrils 2 (two) times daily. 05/31/19  Yes Lauraine Rinne, NP  guaiFENesin (MUCINEX) 600 MG 12 hr tablet Take 600 mg by mouth 2 (two) times daily.   Yes [provider]  ipratropium (ATROVENT HFA) 17 MCG/ACT inhaler Inhale 1 puff into the lungs 2 (two) times daily. 07/24/19 07/23/20 Yes Regalado, Belkys A, MD  melatonin 5 MG TABS Take 10 mg by mouth at bedtime.    Yes [provider]  mometasone-formoterol (DULERA) 200-5 MCG/ACT AERO Inhale 2 puffs into the lungs 2 (two) times daily. 07/24/19  Yes Regalado, Belkys A, MD  omeprazole (PRILOSEC) 40 MG capsule Take 40 mg by mouth 2 (two) times daily.  11/03/13  Yes [provider]  ondansetron (ZOFRAN-ODT) 4 MG disintegrating tablet Take 4 mg by mouth every 4 (four) hours as needed for nausea or vomiting.  09/16/19  Yes [provider]  oxyCODONE-acetaminophen (PERCOCET) 5-325 MG tablet Take 1 tablet by mouth every 4 (four) hours as needed (max 6 q for post op pain). 06/14/19  Yes Shuford, Olivia Mackie, PA-C  polyethylene glycol (MIRALAX / GLYCOLAX) 17 g packet Take 17 g by mouth daily.   Yes [provider]  predniSONE (DELTASONE) 20 MG tablet Take 20 mg by mouth every morning. 09/16/19  Yes [provider]  rosuvastatin (CRESTOR) 10 MG tablet Take 10 mg by mouth every Monday, Wednesday, and Friday.    Yes [provider]  Vitamin D, Ergocalciferol, (DRISDOL) 50000 units CAPS capsule  Take 50,000 Units by mouth every Friday.    Yes [provider]    benzonatate (TESSALON) 100 MG capsule Take 1 capsule (100 mg total) by mouth every 6 (six) hours as needed for cough. 09/18/19   Collene Gobble, MD  doxycycline (MONODOX) 100 MG capsule Take 100 mg by mouth 2 (two) times daily. Patient not taking: Reported on 09/18/2019 09/07/19   [provider]  furosemide (LASIX) 20 MG tablet Take 1 tablet (20 mg total) by mouth daily. Patient not taking: Reported on 09/18/2019 07/24/19   Regalado, Jerald Kief A, MD  ipratropium (ATROVENT) 0.03 % nasal spray Place 2 sprays into both nostrils 4 (four) times daily as needed for rhinitis. Patient not taking: Reported on 09/18/2019 05/31/19   Lauraine Rinne, NP  nitroGLYCERIN (NITROSTAT) 0.4 MG SL tablet Place 1 tablet (0.4 mg total) under the tongue every 5 (five) minutes as needed for chest pain. 07/11/17   [name] T, PA-C  ondansetron (ZOFRAN) 4 MG tablet Take 1 tablet (4 mg total) by mouth every 8 (eight) hours as needed for nausea or vomiting (take along with pain med). Patient not taking: Reported on 09/18/2019 06/14/19   Shuford, Olivia Mackie, PA-C  potassium chloride (KLOR-CON) 10 MEQ tablet Take 1 tablet (10 mEq total) by mouth daily. Patient not taking: Reported on 09/18/2019 07/24/19   Regalado, Jerald Kief A, MD  predniSONE (STERAPRED UNI-PAK 21 TAB) 10 MG (21) TBPK tablet Take by mouth as directed. Patient not taking: Reported on 09/18/2019 09/11/19   [provider]  saccharomyces boulardii (FLORASTOR) 250 MG capsule Take 1 capsule (250 mg total) by mouth 2 (two) times daily. Patient not taking: Reported on 09/18/2019 07/24/19   Niel Hummer A, MD    Allergies    Levaquin [levofloxacin], Sulfonamide derivatives, Oxycodone, and Morphine  Review of Systems   Review of Systems  Respiratory: Positive for cough.   Neurological: Positive for weakness.  All other systems reviewed and are negative.   Physical Exam Updated Vital Signs BP (!) 136/61 (BP Location: Left Arm)   Pulse 75   Temp 98 F (36.7  C)   Resp 18   Ht 5\' 3"  (1.6 m)   Wt 53.1 kg   LMP 02/22/1983 (Approximate)   SpO2 95%   BMI 20.73 kg/m   Physical Exam Vitals and nursing note reviewed.  Constitutional:      Appearance: She is well-developed.  HENT:     Head: Normocephalic and atraumatic.  Cardiovascular:     Rate and Rhythm: Normal rate and regular rhythm.     Heart sounds: No murmur heard.   Pulmonary:     Effort: Pulmonary effort is normal. No respiratory distress.     Comments: Crackles in right lung fields.   Abdominal:     Palpations: Abdomen is soft.     Tenderness: There is no abdominal tenderness. There is no guarding or rebound.  Musculoskeletal:        General: No swelling or tenderness.  Skin:    General: Skin is warm and dry.  Neurological:     Mental Status: She is alert and oriented to person, place, and time.  Psychiatric:        Behavior: Behavior normal.     ED Results / Procedures / Treatments   Labs (all labs ordered are listed, but only abnormal results are displayed) Labs Reviewed  COMPREHENSIVE METABOLIC PANEL - Abnormal; Notable for the following components:      Result Value  Sodium 133 (*)    Glucose, Bld 161 (*)    Calcium 8.6 (*)    Total Protein 6.1 (*)    Albumin 3.4 (*)    All other components within normal limits  CBC WITH DIFFERENTIAL/PLATELET - Abnormal; Notable for the following components:   WBC 27.1 (*)    RDW 16.5 (*)    Neutro Abs 24.8 (*)    Abs Immature Granulocytes 0.72 (*)    All other components within normal limits  SARS CORONAVIRUS 2 BY RT PCR (HOSPITAL ORDER, [facility])  CULTURE, BLOOD (ROUTINE X 2)  CULTURE, BLOOD (ROUTINE X 2)  URINE CULTURE  RESPIRATORY PANEL BY PCR  LACTIC ACID, PLASMA  LACTIC ACID, PLASMA  APTT  PROTIME-INR  URINALYSIS, ROUTINE W REFLEX MICROSCOPIC  HEMOGLOBIN A1C    EKG None  Radiology CT Angio Chest PE W/Cm &/Or Wo Cm  Result Date: 09/18/2019 CLINICAL DATA:  85 year old female  with chest pain and shortness of breath. Tested negative for COVID-19 2 days ago. EXAM: CT ANGIOGRAPHY CHEST WITH CONTRAST TECHNIQUE: Multidetector CT imaging of the chest was performed using the standard protocol during bolus administration of intravenous contrast. Multiplanar CT image reconstructions and MIPs were obtained to evaluate the vascular anatomy. CONTRAST:  179mL OMNIPAQUE IOHEXOL 350 MG/ML SOLN COMPARISON:  Portable chest earlier today. CT Abdomen and Pelvis 07/29/2019. High-resolution chest CT 01/22/2019 FINDINGS: Cardiovascular: Good contrast bolus timing in the pulmonary arterial tree. Respiratory motion. The central and proximal bilateral middle and lower lobe pulmonary arteries are best evaluated and appear patent with no filling defect identified. The upper lobe and distal lung base vessels are obscured by motion. No cardiomegaly or pericardial effusion. Negative visible aorta aside from atherosclerosis. Calcified coronary artery atherosclerosis is evident on series 4, image 79. Mediastinum/Nodes: Negative. No mediastinal lymphadenopathy. Small calcified right paratracheal lymph node is stable. Lungs/Pleura: Small to moderate layering left pleural effusion has resolved since last month. However there is a new small area of left lower lobe lateral basal segment peribronchial opacity Allowing for motion artifact the major airways are patent. There is chronic scarring in the right upper lobe. New confluent peribronchial opacity in the posterior basal segment of the right lower lobe with both solid and sub solid areas of involvement. Stable right middle lobe bronchiectasis. Chronically large lung volumes. Upper Abdomen: Negative visible upper abdomen today. Chronic small gastric hiatal hernia. Of note, the visible left kidney has normalized since the appearance on 07/29/2019 (please see that report). Musculoskeletal: Chronic T11 compression fracture. Exaggerated thoracic kyphosis. Osteopenia. Left  shoulder arthroplasty. Chronic right 5th rib fractures. Chronic left 11th rib fractures. No acute osseous abnormality identified. Review of the MIP images confirms the above findings. IMPRESSION: 1. Motion degraded exam. No central or proximal lower lung pulmonary embolus, but other branches - especially in the upper lobes - are obscured. 2. Chronic pulmonary hyperinflation, right middle lobe bronchiectasis. Confluent peribronchial opacity in the lower lobes greater on the right most suggestive of Bronchopneumonia. However, left pleural effusion seen last month has resolved. 3. Aortic Atherosclerosis (ICD10-I70.0). Electronically Signed   By: Genevie Ann M.D.   On: 09/18/2019 20:48   DG Chest Port 1 View  Result Date: 09/18/2019 CLINICAL DATA:  85 year old female with shortness of breath and cough. EXAM: PORTABLE CHEST 1 VIEW COMPARISON:  Chest radiograph dated 07/29/2019. FINDINGS: Background of emphysema. Probable small right pleural effusion with minimal right lung base atelectasis. No focal consolidation, pleural effusion, or pneumothorax. The cardiac silhouette  is within limits. Atherosclerotic calcification of the aorta. Calcified mediastinal granuloma. Osteopenia with degenerative changes of the spine. Left shoulder arthroplasty. No acute osseous pathology. IMPRESSION: Probable small right pleural effusion with minimal right lung base atelectasis. Electronically Signed   By: Anner Crete M.D.   On: 09/18/2019 17:29    Procedures Procedures (including critical care time)  Medications Ordered in ED Medications  enoxaparin (LOVENOX) injection 40 mg (40 mg Subcutaneous Given 09/18/19 2033)  cefTRIAXone (ROCEPHIN) 2 g in sodium chloride 0.9 % 100 mL IVPB (has no administration in time range)  azithromycin (ZITHROMAX) 500 mg in sodium chloride 0.9 % 250 mL IVPB (has no administration in time range)  insulin aspart (novoLOG) injection 0-9 Units (has no administration in time range)  acetaminophen  (TYLENOL) tablet 650 mg (has no administration in time range)  ipratropium-albuterol (DUONEB) 0.5-2.5 (3) MG/3ML nebulizer solution 3 mL (has no administration in time range)  benzonatate (TESSALON) capsule 200 mg (has no administration in time range)  aspirin EC tablet 81 mg (81 mg Oral Given 09/18/19 2033)  diltiazem (CARDIZEM CD) 24 hr capsule 180 mg (has no administration in time range)  rosuvastatin (CRESTOR) tablet 10 mg (has no administration in time range)  pantoprazole (PROTONIX) EC tablet 40 mg (has no administration in time range)  polyethylene glycol (MIRALAX / GLYCOLAX) packet 17 g (has no administration in time range)  docusate sodium (COLACE) capsule 100 mg (has no administration in time range)  calcium carbonate (TUMS - dosed in mg elemental calcium) chewable tablet 200 mg of elemental calcium (has no administration in time range)  melatonin tablet 10 mg (has no administration in time range)  Vitamin D (Ergocalciferol) (DRISDOL) capsule 50,000 Units (has no administration in time range)  albuterol (PROVENTIL) (2.5 MG/3ML) 0.083% nebulizer solution 2.5 mg (has no administration in time range)  loratadine (CLARITIN) tablet 10 mg (has no administration in time range)  fluticasone (FLONASE) 50 MCG/ACT nasal spray 2 spray (has no administration in time range)  mometasone-formoterol (DULERA) 200-5 MCG/ACT inhaler 2 puff (has no administration in time range)  sodium chloride (PF) 0.9 % injection (has no administration in time range)  feeding supplement (ENSURE ENLIVE) (ENSURE ENLIVE) liquid 237 mL (has no administration in time range)  azithromycin (ZITHROMAX) tablet 500 mg (500 mg Oral Given 09/18/19 1654)  iohexol (OMNIPAQUE) 350 MG/ML injection 100 mL (100 mLs Intravenous Contrast Given 09/18/19 2011)    ED Course  I have reviewed the triage vital signs and the nursing notes.  Pertinent labs & imaging results that were available during my care of the patient were reviewed by me and  considered in my medical decision making (see chart for details).    MDM Rules/Calculators/A&P                         Patient here for evaluation of progressive cough, outpatient labs with leukocytosis on CBC. She does have frequent coughing on examination with crackles in the right lung fields. She was treated with antibiotics for community acquired pneumonia. Plan to admit for further treatment given failure of outpatient antibiotics. Medicine consulted for admission. Patient and daughter updated findings of studies and she is in agreement with treatment plan.  Final Clinical Impression(s) / ED Diagnoses Final diagnoses:  Community acquired pneumonia of right lung, unspecified part of lung    Rx / DC Orders ED Discharge Orders    None       Quintella Reichert, MD 09/18/19 2115

## 2019-09-18 NOTE — ED Notes (Signed)
Transport called.

## 2019-09-18 NOTE — Telephone Encounter (Signed)
Would recommend guaifenesin 600mg  bid is she is not already taking.  Could offer her tessalon perles 100mg  q6h prn for cough

## 2019-09-18 NOTE — Telephone Encounter (Signed)
Called and spoke to pt.  Pt was prescribed doxy on 09/09/2019 by our office, however this was replaced with cipro by PCP on 09/11/2019.  Pt will complete current course of Cipro tomorrow, however PCP prescribed 5 additional days.  She is also taking prednisone 20mg  daily x5d.  Pt stated that symptoms have slightly improved with current treatment but are still present.  Pt reports of prod cough with thick green to yellowish mucus, weakness and wheezing.  covid test was negative. She had a CXR with PCP.  Denies fever, chills or sweats.   Dr. [name], please advise. Thanks.

## 2019-09-18 NOTE — Telephone Encounter (Signed)
Spoke with the pt and notified of recs per RB  She verbalized understanding  Rx was sent to pharm for tessalon  She is already taking guafenisin

## 2019-09-18 NOTE — ED Notes (Signed)
Purewick placed on pt. 

## 2019-09-18 NOTE — H&P (Addendum)
History and Physical    [name] WUX:[id number] DOB: 06/11/1934 DOA: 09/18/2019  PCP: [name], MD  Patient coming from: Home, lives with daughter who is at bedside today  I have personally briefly reviewed patient's old medical records in [place]  Chief Complaint: Persistent cough and shortness of breath  HPI: [name] is a 85 y.o. female with medical history significant for CAD, paroxysmal supraventricular tachycardia,hx of bronchiectasis/sarcoidosis, hyperlipidemia who presents with concerns of persistent cough and shortness of breath.  For close to 2 weeks, patient has had persistent coughing productive of yellow sputum as well as shortness of breath with exertion. Has also heard wheezing and has been persistently using her albuterol without much relief. Prior to this, she also has been having some pressure headache as well as maxillary pressure. No fever. She has been trying Delsym and Mucinex for cough without relief. She initially saw urgent care close to 2 weeks ago and was placed on doxycycline and this later was switched to cefdinir by her PCP. She was briefly also put on a week course of 20 mg prednisone.  Has had low appetite. Patient has been in and out of the hospital for mostly urinary infections and thinks that she has lost over 20 pounds since October due to this. However denies any nausea, vomiting or diarrhea. No abdominal pain. No dysuria. Patient overall just feels weak and fatigued since she has not been able to sleep due to persistent cough.  She was sent over by her PCP today for concerns of bilateral pneumonia.  ED Course:  She was afebrile, normotensive on room air.  WBC of 27.1. Negative PCR COVID. CXR shows probable right pleural effusion with mild atelectasis.  Review of Systems:  Constitutional: No Weight Change, No Fever ENT/Mouth: No sore throat, No Rhinorrhea Eyes: No Eye Pain, No Vision Changes Cardiovascular: No Chest  Pain, +SOB, No PND, + Dyspnea on Exertion, No Orthopnea,  No Edema, No Palpitations Respiratory: + Cough, + Sputum, + Wheezing, + Dyspnea  Gastrointestinal: No Nausea, No Vomiting, No Diarrhea, No Constipation, No Pain Genitourinary: no Urinary Incontinence Musculoskeletal: No Arthralgias, No Myalgias Skin: No Skin Lesions, No Pruritus, Neuro: no Weakness, No Numbness Psych: No Anxiety/Panic, No Depression, + decrease appetite Heme/Lymph: No Bruising, No Bleeding  Past Medical History:  Diagnosis Date  . Allergy    SEASONAL  . Anemia   . Arthritis   . Asthma    "slight"   . Baker's cyst of knee, right   . CAD (coronary artery disease)    a. Myoview 10/15 - normal EF 70% // Myoview 11/16: EF 75%, normal perfusion, (there was no blood pressure drop at low level exercise with Lexiscan infusion), low risk study // c. LHC 3/17 - LAD irregs, oLCx 70 (neg FFR), mRCA 30, EF 55-65% >> med Rx // Myoview 07/2018:  EF 73, extracardiac uptake, no significant ischemia (reviewed with Dr. Burt Knack), Low Risk   . Carotid stenosis    a. Carotid US 9/15 - Bilateral 1-39% ICA >> FU 2 years // b. Bilateral ICA 1-39 >> FU prn // Carotid US 06/2018: bilat 1-39; fu prn  . Dyspnea    due to Sarcoidosis. When is windy or humed  . Dysrhythmia    fast heart rate  . Elbow fracture 04/2017   Right, had surgery  . Esophageal reflux   . Hematochezia   . History of echocardiogram    a. Echo 10/15 - EF 60-65%, no RWMA  .  HLD (hyperlipidemia)   . Hx of colonoscopy   . Osteoporosis   . Other chronic pulmonary heart diseases   . Paroxysmal supraventricular tachycardia ([place])   . Pneumonia   . Sarcoidosis     Past Surgical History:  Procedure Laterality Date  . arm surgery Right   . BLADDER SURGERY    . CARDIAC CATHETERIZATION N/A 05/20/2015   Procedure: Left Heart Cath and Coronary Angiography;  Surgeon: Sherren Mocha, MD;  Location: [facility];  Service: Cardiovascular;  Laterality: N/A;  . CARPAL  TUNNEL RELEASE Left   . COLONOSCOPY    . CYSTOSCOPY W/ RETROGRADES Left 07/30/2019   Procedure: CYSTOSCOPY WITH RETROGRADE PYELOGRAM LEFT STENT PLACEMENT;  Surgeon: Ardis Hughs, MD;  Location: [facility];  Service: Urology;  Laterality: Left;  . CYSTOSCOPY WITH RETROGRADE PYELOGRAM, URETEROSCOPY AND STENT PLACEMENT Left 08/20/2019   Procedure: CYSTOSCOPY, URETEROSCOPY AND STENT PLACEMENT;  Surgeon: Robley Fries, MD;  Location: [facility];  Service: Urology;  Laterality: Left;  1 HR  . FOOT SURGERY Right   . HIP SURGERY Right 2011   full replacement  . HOLMIUM LASER APPLICATION Left 6/73/4193   Procedure: HOLMIUM LASER APPLICATION;  Surgeon: Robley Fries, MD;  Location: [facility];  Service: Urology;  Laterality: Left;  . LEG SURGERY Left May 2014   femur fracture s/p open and closed reduction in [place], Dr. Jimmye Norman  . ORIF ELBOW FRACTURE Right 05/09/2017   Procedure: ORIF right olecranon fracture with repair/reconstruction, ulnar nerve transposition as needed;  Surgeon: Roseanne Kaufman, MD;  Location: [facility];  Service: Orthopedics;  Laterality: Right;  Requests 90 mins  . pelvis fracture    . POLYPECTOMY    . REVERSE SHOULDER ARTHROPLASTY Left 06/13/2019   Procedure: REVERSE SHOULDER ARTHROPLASTY;  Surgeon: Justice Britain, MD;  Location: [facility];  Service: Orthopedics;  Laterality: Left;  15min  . SHOULDER ARTHROSCOPY W/ ROTATOR CUFF REPAIR Right   . TRAPEZIUM RESECTION Right    Social History  reports that she has never smoked. She has never used smokeless tobacco. She reports that she does not drink alcohol and does not use drugs.  Allergies  Allergen Reactions  . Levaquin [Levofloxacin] Other (See Comments)    Leg pain .Marland Kitchen Per doctor not to take again  . Sulfonamide Derivatives     Pt unsure  . Oxycodone Nausea Only    Can take oxycodone with Zofran  . Morphine Nausea Only    Family History  Problem Relation Age of Onset  . Colon cancer Mother 68  . Cancer Mother   . Lung  cancer Father   . Heart disease Father   . Arrhythmia Father   . Cancer Father   . Cancer Sister        ovarian  . Heart attack Brother   . Esophageal cancer Neg Hx   . Rectal cancer Neg Hx   . Stomach cancer Neg Hx   . Stroke Neg Hx      Prior to Admission medications   Medication Sig Start Date End Date Taking? Authorizing Provider  acetaminophen (TYLENOL) 500 MG tablet Take 1,000 mg by mouth every 6 (six) hours as needed for mild pain.    Yes [provider]  albuterol (VENTOLIN HFA) 108 (90 Base) MCG/ACT inhaler Inhale 2 puffs into the lungs every 4 (four) hours as needed for wheezing or shortness of breath. 01/07/19  Yes Martyn Ehrich, NP  aspirin EC 81 MG tablet Take 1 tablet (81 mg  total) by mouth daily. 12/23/14  Yes Weaver, [name] T, PA-C  calcium carbonate (TUMS - DOSED IN MG ELEMENTAL CALCIUM) 500 MG chewable tablet Chew 1 tablet by mouth daily as needed for indigestion.    Yes [provider]  cefdinir (OMNICEF) 300 MG capsule Take 300 mg by mouth 2 (two) times daily. 09/11/19  Yes [provider]  cetirizine (ZYRTEC) 10 MG tablet Take 10 mg by mouth daily.   Yes [provider]  denosumab (PROLIA) 60 MG/ML SOLN injection Inject 60 mg into the skin every 6 (six) months. Administer in upper arm, thigh, or abdomen   Yes [provider]  dextromethorphan (DELSYM) 30 MG/5ML liquid Take 30 mg by mouth 2 (two) times daily as needed for cough.   Yes [provider]  diltiazem (CARDIZEM CD) 180 MG 24 hr capsule Take 180 mg by mouth at bedtime.  05/08/19  Yes [provider]  docusate sodium (COLACE) 50 MG capsule Take 100 mg by mouth at bedtime.    Yes [provider]  fluticasone (FLONASE) 50 MCG/ACT nasal spray Place 2 sprays into both nostrils 2 (two) times daily. 05/31/19  Yes Lauraine Rinne, NP  guaiFENesin (MUCINEX) 600 MG 12 hr tablet Take 600 mg by mouth 2 (two) times daily.   Yes [provider]    ipratropium (ATROVENT HFA) 17 MCG/ACT inhaler Inhale 1 puff into the lungs 2 (two) times daily. 07/24/19 07/23/20 Yes Regalado, Belkys A, MD  melatonin 5 MG TABS Take 10 mg by mouth at bedtime.    Yes [provider]  mometasone-formoterol (DULERA) 200-5 MCG/ACT AERO Inhale 2 puffs into the lungs 2 (two) times daily. 07/24/19  Yes Regalado, Belkys A, MD  omeprazole (PRILOSEC) 40 MG capsule Take 40 mg by mouth 2 (two) times daily.  11/03/13  Yes [provider]  ondansetron (ZOFRAN-ODT) 4 MG disintegrating tablet Take 4 mg by mouth every 4 (four) hours as needed for nausea or vomiting.  09/16/19  Yes [provider]  oxyCODONE-acetaminophen (PERCOCET) 5-325 MG tablet Take 1 tablet by mouth every 4 (four) hours as needed (max 6 q for post op pain). 06/14/19  Yes Shuford, Olivia Mackie, PA-C  polyethylene glycol (MIRALAX / GLYCOLAX) 17 g packet Take 17 g by mouth daily.   Yes [provider]  predniSONE (DELTASONE) 20 MG tablet Take 20 mg by mouth every morning. 09/16/19  Yes [provider]  rosuvastatin (CRESTOR) 10 MG tablet Take 10 mg by mouth every Monday, Wednesday, and Friday.    Yes [provider]  Vitamin D, Ergocalciferol, (DRISDOL) 50000 units CAPS capsule Take 50,000 Units by mouth every Friday.    Yes [provider]  benzonatate (TESSALON) 100 MG capsule Take 1 capsule (100 mg total) by mouth every 6 (six) hours as needed for cough. 09/18/19   Collene Gobble, MD  doxycycline (MONODOX) 100 MG capsule Take 100 mg by mouth 2 (two) times daily. Patient not taking: Reported on 09/18/2019 09/07/19   [provider]  furosemide (LASIX) 20 MG tablet Take 1 tablet (20 mg total) by mouth daily. Patient not taking: Reported on 09/18/2019 07/24/19   Regalado, Jerald Kief A, MD  ipratropium (ATROVENT) 0.03 % nasal spray Place 2 sprays into both nostrils 4 (four) times daily as needed for rhinitis. Patient not taking: Reported on 09/18/2019 05/31/19   Lauraine Rinne, NP  nitroGLYCERIN (NITROSTAT) 0.4 MG SL tablet Place 1 tablet (0.4 mg total) under the tongue every 5 (five)  minutes as needed for chest pain. 07/11/17   [name] T, PA-C  ondansetron (ZOFRAN) 4 MG tablet Take 1 tablet (4 mg total) by mouth every 8 (eight) hours as needed for nausea or vomiting (take along with pain med). Patient not taking: Reported on 09/18/2019 06/14/19   Shuford, Olivia Mackie, PA-C  potassium chloride (KLOR-CON) 10 MEQ tablet Take 1 tablet (10 mEq total) by mouth daily. Patient not taking: Reported on 09/18/2019 07/24/19   Regalado, Jerald Kief A, MD  predniSONE (STERAPRED UNI-PAK 21 TAB) 10 MG (21) TBPK tablet Take by mouth as directed. Patient not taking: Reported on 09/18/2019 09/11/19   [provider]  saccharomyces boulardii (FLORASTOR) 250 MG capsule Take 1 capsule (250 mg total) by mouth 2 (two) times daily. Patient not taking: Reported on 09/18/2019 07/24/19   Elmarie Shiley, MD    Physical Exam: Vitals:   09/18/19 1600 09/18/19 1642 09/18/19 1643 09/18/19 1758  BP:  (!) 125/52  (!) 123/51  Pulse:  71  77  Resp:  23  22  Temp:  98.1 F (36.7 C)    TempSrc:  Oral    SpO2: 98% 95%  95%  Weight:   53.1 kg   Height:   5\' 3"  (1.6 m)     Constitutional: NAD, calm, comfortable, nontoxic appearing thin elderly female sitting upright in bed Vitals:   09/18/19 1600 09/18/19 1642 09/18/19 1643 09/18/19 1758  BP:  (!) 125/52  (!) 123/51  Pulse:  71  77  Resp:  23  22  Temp:  98.1 F (36.7 C)    TempSrc:  Oral    SpO2: 98% 95%  95%  Weight:   53.1 kg   Height:   5\' 3"  (1.6 m)    Eyes: PERRL, lids and conjunctivae normal ENMT: Mucous membranes are moist. Neck: normal, suppl Respiratory: c bibasilar rhonchi with faint expiratory wheezing. Normal respiratory effort on room air. No accessory muscle use.  Cardiovascular: Regular rate and rhythm, no murmurs / rubs / gallops. No extremity edema.  Abdomen: no tenderness, no masses palpated.  Bowel sounds  positive.  Musculoskeletal: no clubbing / cyanosis. No joint deformity upper and lower extremities. Good ROM, no contractures. Normal muscle tone.  Skin: no rashes, lesions, ulcers. No induration Neurologic: CN 2-12 grossly intact. Sensation intact Strength 5/5 in all 4.  Psychiatric: Normal judgment and insight. Alert and oriented x 3. Normal mood.     Labs on Admission: I have personally reviewed following labs and imaging studies  CBC: Recent Labs  Lab 09/18/19 1650  WBC 27.1*  NEUTROABS 24.8*  HGB 13.5  HCT 41.4  MCV 96.7  PLT 976   Basic Metabolic Panel: Recent Labs  Lab 09/18/19 1650  NA 133*  K 4.7  CL 100  CO2 23  GLUCOSE 161*  BUN 19  CREATININE 0.56  CALCIUM 8.6*   GFR: Estimated Creatinine Clearance: 44.1 mL/min (by C-G formula based on SCr of 0.56 mg/dL). Liver Function Tests: Recent Labs  Lab 09/18/19 1650  AST 15  ALT 18  ALKPHOS 90  BILITOT 0.7  PROT 6.1*  ALBUMIN 3.4*   No results for input(s): LIPASE, AMYLASE in the last 168 hours. No results for input(s): AMMONIA in the last 168 hours. Coagulation Profile: Recent Labs  Lab 09/18/19 1650  INR 1.0   Cardiac Enzymes: No results for input(s): CKTOTAL, CKMB, CKMBINDEX, TROPONINI in the last 168 hours. BNP (last 3 results) No results for input(s): PROBNP in the last 8760 hours.  HbA1C: No results for input(s): HGBA1C in the last 72 hours. CBG: No results for input(s): GLUCAP in the last 168 hours. Lipid Profile: No results for input(s): CHOL, HDL, LDLCALC, TRIG, CHOLHDL, LDLDIRECT in the last 72 hours. Thyroid Function Tests: No results for input(s): TSH, T4TOTAL, FREET4, T3FREE, THYROIDAB in the last 72 hours. Anemia Panel: No results for input(s): VITAMINB12, FOLATE, FERRITIN, TIBC, IRON, RETICCTPCT in the last 72 hours. Urine analysis:    Component Value Date/Time   COLORURINE YELLOW 07/29/2019 2139   APPEARANCEUR CLEAR 07/29/2019 2139   LABSPEC 1.020 07/29/2019 2139   PHURINE  8.0 07/29/2019 2139   GLUCOSEU NEGATIVE 07/29/2019 2139   HGBUR NEGATIVE 07/29/2019 2139   BILIRUBINUR NEGATIVE 07/29/2019 2139   BILIRUBINUR negative 03/21/2018 1328   KETONESUR NEGATIVE 07/29/2019 2139   PROTEINUR NEGATIVE 07/29/2019 2139   UROBILINOGEN negative (A) 03/21/2018 1328   UROBILINOGEN 0.2 12/08/2008 1204   NITRITE NEGATIVE 07/29/2019 2139   LEUKOCYTESUR NEGATIVE 07/29/2019 2139    Radiological Exams on Admission: DG Chest Port 1 View  Result Date: 09/18/2019 CLINICAL DATA:  85 year old female with shortness of breath and cough. EXAM: PORTABLE CHEST 1 VIEW COMPARISON:  Chest radiograph dated 07/29/2019. FINDINGS: Background of emphysema. Probable small right pleural effusion with minimal right lung base atelectasis. No focal consolidation, pleural effusion, or pneumothorax. The cardiac silhouette is within limits. Atherosclerotic calcification of the aorta. Calcified mediastinal granuloma. Osteopenia with degenerative changes of the spine. Left shoulder arthroplasty. No acute osseous pathology. IMPRESSION: Probable small right pleural effusion with minimal right lung base atelectasis. Electronically Signed   By: Anner Crete M.D.   On: 09/18/2019 17:29      Assessment/Plan  community-acquired pneumonia w/hx of bronchiectasis/sarcoidosis Patient with persistent cough but no objective fever. Has history of bronchiectasis with sarcoidosis.  CTA chest reviewed and showed no pulmonary embolism or any new sarcoidosis finding.  There is bilateral opacity suspicious for bronchial pneumonia.   Will continue treatment for Rocephin and azithromycin Scheduled DuoNebs every 6 hrs Flutter valve  Mild Hyperglycemia Likely induced by recent short course of prednisone Low dose SSI  CAD continue aspirin  History of paroxysmal SVT Controlled with diltiazem  Hyperlipidemia Continue statin  GERD Continue PPI  DVT prophylaxis:.Lovenox Code Status: Full Family Communication:  Plan discussed with patient and daughter at bedside. All questions and concerns were addressed. disposition Plan: Home with observation Consults called:  Admission status: Observation  The patient remains OBS appropriate and will d/c before 2 midnights. Patient here with community-acquired pneumonia versus bronchiectasis flareup and remains stable on room air. Likely could discharge tomorrow if no other acute findings on CTA chest.  Dispo: The patient is from: Home              Anticipated d/c is to: Home              Anticipated d/c date is: 1 day              Patient currently is medically stable to d/c.         Orene Desanctis DO [facility]   If 7PM-7AM, please contact night-coverage [email or web address]   09/18/2019, 7:11 PM

## 2019-09-18 NOTE — ED Triage Notes (Signed)
Pt BIBA from PCP office-  Per EMS- Pt dx with BL pneumonia today, covid negative two days ago. Pt has received 2/2 vaccines. Pt reports SHOB x1 week, worsening 2 days ago.

## 2019-09-19 DIAGNOSIS — Z7982 Long term (current) use of aspirin: Secondary | ICD-10-CM | POA: Diagnosis not present

## 2019-09-19 DIAGNOSIS — Z96612 Presence of left artificial shoulder joint: Secondary | ICD-10-CM | POA: Diagnosis present

## 2019-09-19 DIAGNOSIS — T380X5A Adverse effect of glucocorticoids and synthetic analogues, initial encounter: Secondary | ICD-10-CM | POA: Diagnosis not present

## 2019-09-19 DIAGNOSIS — J470 Bronchiectasis with acute lower respiratory infection: Secondary | ICD-10-CM | POA: Diagnosis not present

## 2019-09-19 DIAGNOSIS — R0602 Shortness of breath: Secondary | ICD-10-CM | POA: Diagnosis present

## 2019-09-19 DIAGNOSIS — D860 Sarcoidosis of lung: Secondary | ICD-10-CM | POA: Diagnosis present

## 2019-09-19 DIAGNOSIS — Z7952 Long term (current) use of systemic steroids: Secondary | ICD-10-CM | POA: Diagnosis not present

## 2019-09-19 DIAGNOSIS — J690 Pneumonitis due to inhalation of food and vomit: Secondary | ICD-10-CM | POA: Diagnosis not present

## 2019-09-19 DIAGNOSIS — Z79899 Other long term (current) drug therapy: Secondary | ICD-10-CM | POA: Diagnosis not present

## 2019-09-19 DIAGNOSIS — R0902 Hypoxemia: Secondary | ICD-10-CM | POA: Diagnosis present

## 2019-09-19 DIAGNOSIS — I2510 Atherosclerotic heart disease of native coronary artery without angina pectoris: Secondary | ICD-10-CM | POA: Diagnosis not present

## 2019-09-19 DIAGNOSIS — H6122 Impacted cerumen, left ear: Secondary | ICD-10-CM | POA: Diagnosis present

## 2019-09-19 DIAGNOSIS — E785 Hyperlipidemia, unspecified: Secondary | ICD-10-CM | POA: Diagnosis not present

## 2019-09-19 DIAGNOSIS — J189 Pneumonia, unspecified organism: Secondary | ICD-10-CM | POA: Diagnosis not present

## 2019-09-19 DIAGNOSIS — I480 Paroxysmal atrial fibrillation: Secondary | ICD-10-CM | POA: Diagnosis present

## 2019-09-19 DIAGNOSIS — Z20822 Contact with and (suspected) exposure to covid-19: Secondary | ICD-10-CM | POA: Diagnosis not present

## 2019-09-19 DIAGNOSIS — I4891 Unspecified atrial fibrillation: Secondary | ICD-10-CM | POA: Diagnosis not present

## 2019-09-19 DIAGNOSIS — R739 Hyperglycemia, unspecified: Secondary | ICD-10-CM | POA: Diagnosis not present

## 2019-09-19 DIAGNOSIS — B370 Candidal stomatitis: Secondary | ICD-10-CM | POA: Diagnosis not present

## 2019-09-19 DIAGNOSIS — E782 Mixed hyperlipidemia: Secondary | ICD-10-CM | POA: Diagnosis not present

## 2019-09-19 DIAGNOSIS — K219 Gastro-esophageal reflux disease without esophagitis: Secondary | ICD-10-CM | POA: Diagnosis not present

## 2019-09-19 DIAGNOSIS — I471 Supraventricular tachycardia: Secondary | ICD-10-CM | POA: Diagnosis not present

## 2019-09-19 DIAGNOSIS — I10 Essential (primary) hypertension: Secondary | ICD-10-CM | POA: Diagnosis not present

## 2019-09-19 DIAGNOSIS — Z7951 Long term (current) use of inhaled steroids: Secondary | ICD-10-CM | POA: Diagnosis not present

## 2019-09-19 LAB — GLUCOSE, CAPILLARY
Glucose-Capillary: 86 mg/dL (ref 70–99)
Glucose-Capillary: 186 mg/dL — ABNORMAL HIGH (ref 70–99)
Glucose-Capillary: 190 mg/dL — ABNORMAL HIGH (ref 70–99)
Glucose-Capillary: 210 mg/dL — ABNORMAL HIGH (ref 70–99)

## 2019-09-19 LAB — COMPREHENSIVE METABOLIC PANEL
ALT: 17 U/L (ref 0–44)
AST: 15 U/L (ref 15–41)
Albumin: 3.5 g/dL (ref 3.5–5.0)
Alkaline Phosphatase: 90 U/L (ref 38–126)
Anion gap: 10 (ref 5–15)
BUN: 16 mg/dL (ref 8–23)
CO2: 27 mmol/L (ref 22–32)
Calcium: 9.1 mg/dL (ref 8.9–10.3)
Chloride: 101 mmol/L (ref 98–111)
Creatinine, Ser: 0.62 mg/dL (ref 0.44–1.00)
GFR calc Af Amer: >60 mL/min (ref >60)
GFR calc non Af Amer: >60 mL/min (ref >60)
Glucose, Bld: 129 mg/dL — ABNORMAL HIGH (ref 70–99)
Potassium: 3.7 mmol/L (ref 3.5–5.1)
Sodium: 138 mmol/L (ref 135–145)
Total Bilirubin: 0.5 mg/dL (ref 0.3–1.2)
Total Protein: 6.6 g/dL (ref 6.5–8.1)

## 2019-09-19 LAB — HEMOGLOBIN A1C
Hgb A1c MFr Bld: 5.8 % — ABNORMAL HIGH (ref 4.8–5.6)
Mean Plasma Glucose: 119.76 mg/dL

## 2019-09-19 LAB — CBC WITH DIFFERENTIAL/PLATELET
Abs Immature Granulocytes: 0.28 10*3/uL — ABNORMAL HIGH (ref 0.00–0.07)
Basophils Absolute: 0 10*3/uL (ref 0.0–0.1)
Basophils Relative: 0 %
Eosinophils Absolute: 0.1 10*3/uL (ref 0.0–0.5)
Eosinophils Relative: 1 %
HCT: 41 % (ref 36.0–46.0)
Hemoglobin: 13.3 g/dL (ref 12.0–15.0)
Immature Granulocytes: 2 %
Lymphocytes Relative: 6 %
Lymphs Abs: 1 10*3/uL (ref 0.7–4.0)
MCH: 31.6 pg (ref 26.0–34.0)
MCHC: 32.4 g/dL (ref 30.0–36.0)
MCV: 97.4 fL (ref 80.0–100.0)
Monocytes Absolute: 0.9 10*3/uL (ref 0.1–1.0)
Monocytes Relative: 5 %
Neutro Abs: 15 10*3/uL — ABNORMAL HIGH (ref 1.7–7.7)
Neutrophils Relative %: 86 %
Platelets: 251 10*3/uL (ref 150–400)
RBC: 4.21 MIL/uL (ref 3.87–5.11)
RDW: 16.7 % — ABNORMAL HIGH (ref 11.5–15.5)
WBC: 17.3 10*3/uL — ABNORMAL HIGH (ref 4.0–10.5)
nRBC: 0 % (ref 0.0–0.2)

## 2019-09-19 LAB — URINE CULTURE: Culture: <10000 — AB

## 2019-09-19 LAB — SEDIMENTATION RATE: Sed Rate: 44 mm/hr — ABNORMAL HIGH (ref 0–22)

## 2019-09-19 LAB — PROCALCITONIN: Procalcitonin: 2.26 ng/mL

## 2019-09-19 LAB — C-REACTIVE PROTEIN: CRP: 18.8 mg/dL — ABNORMAL HIGH (ref <1.0)

## 2019-09-19 LAB — MAGNESIUM: Magnesium: 2.2 mg/dL (ref 1.7–2.4)

## 2019-09-19 MED ORDER — DEXTROMETHORPHAN POLISTIREX ER 30 MG/5ML PO SUER
30.0000 mg | Freq: Two times a day (BID) | ORAL | Status: DC
  Administered 2019-09-19 (×2): 30 mg via ORAL
  Filled 2019-09-19 (×3): qty 5

## 2019-09-19 MED ORDER — AZITHROMYCIN 250 MG PO TABS
500.0000 mg | ORAL_TABLET | Freq: Every day | ORAL | Status: AC
  Administered 2019-09-19 – 2019-09-22 (×4): 500 mg via ORAL
  Filled 2019-09-19 (×4): qty 2

## 2019-09-19 MED ORDER — GUAIFENESIN ER 600 MG PO TB12
600.0000 mg | ORAL_TABLET | Freq: Two times a day (BID) | ORAL | Status: DC
  Administered 2019-09-19 – 2019-09-26 (×15): 600 mg via ORAL
  Filled 2019-09-19 (×15): qty 1

## 2019-09-19 MED ORDER — SODIUM CHLORIDE 0.9 % IV SOLN
3.0000 g | Freq: Four times a day (QID) | INTRAVENOUS | Status: DC
  Administered 2019-09-19 – 2019-09-22 (×11): 3 g via INTRAVENOUS
  Filled 2019-09-19 (×2): qty 3
  Filled 2019-09-19: qty 8
  Filled 2019-09-19: qty 3
  Filled 2019-09-19 (×2): qty 8
  Filled 2019-09-19: qty 3
  Filled 2019-09-19: qty 8
  Filled 2019-09-19: qty 1.5
  Filled 2019-09-19: qty 8
  Filled 2019-09-19 (×2): qty 3
  Filled 2019-09-19: qty 8

## 2019-09-19 MED ORDER — BENZONATATE 100 MG PO CAPS
100.0000 mg | ORAL_CAPSULE | Freq: Three times a day (TID) | ORAL | Status: DC
  Administered 2019-09-19 – 2019-09-26 (×22): 100 mg via ORAL
  Filled 2019-09-19 (×22): qty 1

## 2019-09-19 MED ORDER — METHYLPREDNISOLONE SODIUM SUCC 40 MG IJ SOLR
40.0000 mg | Freq: Every day | INTRAMUSCULAR | Status: DC
  Administered 2019-09-19: 40 mg via INTRAVENOUS
  Filled 2019-09-19: qty 1

## 2019-09-19 NOTE — Progress Notes (Signed)
PHARMACIST - PHYSICIAN COMMUNICATION  CONCERNING: Antibiotic IV to Oral Route Change Policy  RECOMMENDATION: This patient is receiving azithromycin by the intravenous route.  Based on criteria approved by the Pharmacy and Therapeutics Committee, the antibiotic(s) is/are being converted to the equivalent oral dose form(s).   DESCRIPTION: These criteria include:  Patient being treated for a respiratory tract infection, urinary tract infection, cellulitis or clostridium difficile associated diarrhea if on metronidazole  The patient is not neutropenic and does not exhibit a GI malabsorption state  The patient is eating (either orally or via tube) and/or has been taking other orally administered medications for a least 24 hours  The patient is improving clinically and has a Tmax < 100.5  If you have questions about this conversion, please contact the Pharmacy Department  []  ( [phone] )  [place] []  ( [phone] )  [facility] []  ( [phone] )  [place] []  ( [phone] )  [facility] [x]  ( [phone] )  [facility]  

## 2019-09-19 NOTE — Progress Notes (Signed)
Pharmacy Antibiotic Note  [name] is a 85 y.o. female on presented to the ED on 09/18/2019 with c/o cough and generalized weakness. She was treated with cefdinir PTA. Chest CT showed findings with concern for "bronchopneumonia". Pharmacy is consulted on 7/29 to start unasyn for PNA.  Plan: - unasyn 3gm IV q6h - azithromycin 500 mg daily  ___________________________________  Height: 5\' 3"  (160 cm) Weight: 53.1 kg (117 lb) IBW/kg (Calculated) : 52.4  Temp (24hrs), Avg:98.1 F (36.7 C), Min:97.7 F (36.5 C), Max:98.3 F (36.8 C)  Recent Labs  Lab 09/18/19 1650 09/18/19 1907 09/19/19 1340  WBC 27.1*  --  17.3*  CREATININE 0.56  --  0.62  LATICACIDVEN 0.9 0.8  --     Estimated Creatinine Clearance: 44.1 mL/min (by C-G formula based on SCr of 0.62 mg/dL).    Allergies  Allergen Reactions  . Levaquin [Levofloxacin] Other (See Comments)    Leg pain .Marland Kitchen Per doctor not to take again  . Sulfonamide Derivatives     Pt unsure  . Oxycodone Nausea Only    Can take oxycodone with Zofran  . Morphine Nausea Only    Antimicrobials this admission:  7/28 CTX x1 7/29 unasyn>> 7/28 Azithro>>   Microbiology results:  7/28 BCx x2:  7/28 UCx:   Thank you for allowing pharmacy to be a part of this patient's care.  Lynelle Doctor 09/19/2019 3:15 PM

## 2019-09-19 NOTE — Progress Notes (Signed)
[facility] Progress Note  Patient: [name]    NWG:[id number]  DOA: 09/18/2019     Date of Service: the patient was seen and examined on 09/19/2019  Brief hospital course: Past medical history of CAD, PSVT, bronchiectasis, sarcoidosis, HLD. Recently treated for pneumonia outpatient with 7-day treatment course with doxycycline followed by another 7-day treatment course for cefdinir along with steroids.  Continues to come to the hospital with worsening shortness of breath and cough. Currently plan is continue IV antibiotics.  Assessment and Plan: 1.  Community-acquired pneumonia. Bronchiectasis flareup secondary to the pneumonia. Sarcoidosis history. CTA chest negative for any pulmonary embolism. No new findings for sarcoidosis. Persistent bronchiectasis seen. Evidence of bronchopneumonia on right lower lobe as well as on the left side. Procalcitonin elevated. CRP elevated. Follow-up on cultures. Continue with IV antibiotics.  Azithromycin, Rocephin changed to Unasyn given concern for aspiration. Continue flutter valve.  2.  Hyperglycemia Check hemoglobin A1c. Likely in the setting of prolonged prednisone course. Monitor.  3.  CAD Continue aspirin  4.  PSVT. Essential hypertension. Continue Cardizem.  5.  HLD Continue statin  6.  GERD Continue PPI Protonix.  Diet: Cardiac diet DVT Prophylaxis:   enoxaparin (LOVENOX) injection 40 mg Start: 09/18/19 2000    Advance goals of care discussion: Full code  Family Communication: family was present at bedside, at the time of interview.  The pt provided permission to discuss medical plan with the family. Opportunity was given to ask question and all questions were answered satisfactorily.   Disposition:  Status is: Inpatient  Remains inpatient appropriate because:IV treatments appropriate due to intensity of illness or inability to take PO  Dispo: The patient is from: Home              Anticipated d/c is  to: Home              Anticipated d/c date is: 2 days              Patient currently is not medically stable to d/c.  Subjective: Continues to have shortness of breath continues to have cough.  No nausea no vomiting.  Fatigue and tiredness.  Poor p.o. intake.  Physical Exam:  General: Appear in mild distress, no Rash; Oral Mucosa Clear, moist. no Abnormal Neck Mass Or lumps, Conjunctiva normal  Cardiovascular: S1 and S2 Present, no Murmur, Respiratory: increased respiratory effort, Bilateral Air entry present and bilateral Crackles, bilateral  wheezes Abdomen: Bowel Sound present, Soft and no tenderness Extremities: no Pedal edema, no calf tenderness Neurology: alert and oriented to time, place, and person affect appropriate. no new focal deficit Gait not checked due to patient safety concerns  Vitals:   09/19/19 0505 09/19/19 0831 09/19/19 0852 09/19/19 1409  BP: (!) 124/51 (!) 118/50  (!) 117/48  Pulse: 79 64  75  Resp: 18 17  18   Temp: 97.7 F (36.5 C) 98.3 F (36.8 C)  98.1 F (36.7 C)  TempSrc: Oral Oral  Oral  SpO2: 93% 93% 95% 97%  Weight:      Height:        Intake/Output Summary (Last 24 hours) at 09/19/2019 1904 Last data filed at 09/19/2019 1643 Gross per 24 hour  Intake 600 ml  Output 300 ml  Net 300 ml   Filed Weights   09/18/19 1643  Weight: 53.1 kg    Data Reviewed: I have personally reviewed and interpreted daily labs, tele strips, imagings as discussed above. I reviewed all nursing notes,  pharmacy notes, vitals, pertinent old records I have discussed plan of care as described above with RN and patient/family.  CBC: Recent Labs  Lab 09/18/19 1650 09/19/19 1340  WBC 27.1* 17.3*  NEUTROABS 24.8* 15.0*  HGB 13.5 13.3  HCT 41.4 41.0  MCV 96.7 97.4  PLT 242 863   Basic Metabolic Panel: Recent Labs  Lab 09/18/19 1650 09/19/19 1340  NA 133* 138  K 4.7 3.7  CL 100 101  CO2 23 27  GLUCOSE 161* 129*  BUN 19 16  CREATININE 0.56 0.62  CALCIUM  8.6* 9.1  MG  --  2.2    Studies: CT Angio Chest PE W/Cm &/Or Wo Cm  Result Date: 09/18/2019 CLINICAL DATA:  85 year old female with chest pain and shortness of breath. Tested negative for COVID-19 2 days ago. EXAM: CT ANGIOGRAPHY CHEST WITH CONTRAST TECHNIQUE: Multidetector CT imaging of the chest was performed using the standard protocol during bolus administration of intravenous contrast. Multiplanar CT image reconstructions and MIPs were obtained to evaluate the vascular anatomy. CONTRAST:  117mL OMNIPAQUE IOHEXOL 350 MG/ML SOLN COMPARISON:  Portable chest earlier today. CT Abdomen and Pelvis 07/29/2019. High-resolution chest CT 01/22/2019 FINDINGS: Cardiovascular: Good contrast bolus timing in the pulmonary arterial tree. Respiratory motion. The central and proximal bilateral middle and lower lobe pulmonary arteries are best evaluated and appear patent with no filling defect identified. The upper lobe and distal lung base vessels are obscured by motion. No cardiomegaly or pericardial effusion. Negative visible aorta aside from atherosclerosis. Calcified coronary artery atherosclerosis is evident on series 4, image 79. Mediastinum/Nodes: Negative. No mediastinal lymphadenopathy. Small calcified right paratracheal lymph node is stable. Lungs/Pleura: Small to moderate layering left pleural effusion has resolved since last month. However there is a new small area of left lower lobe lateral basal segment peribronchial opacity Allowing for motion artifact the major airways are patent. There is chronic scarring in the right upper lobe. New confluent peribronchial opacity in the posterior basal segment of the right lower lobe with both solid and sub solid areas of involvement. Stable right middle lobe bronchiectasis. Chronically large lung volumes. Upper Abdomen: Negative visible upper abdomen today. Chronic small gastric hiatal hernia. Of note, the visible left kidney has normalized since the appearance on  07/29/2019 (please see that report). Musculoskeletal: Chronic T11 compression fracture. Exaggerated thoracic kyphosis. Osteopenia. Left shoulder arthroplasty. Chronic right 5th rib fractures. Chronic left 11th rib fractures. No acute osseous abnormality identified. Review of the MIP images confirms the above findings. IMPRESSION: 1. Motion degraded exam. No central or proximal lower lung pulmonary embolus, but other branches - especially in the upper lobes - are obscured. 2. Chronic pulmonary hyperinflation, right middle lobe bronchiectasis. Confluent peribronchial opacity in the lower lobes greater on the right most suggestive of Bronchopneumonia. However, left pleural effusion seen last month has resolved. 3. Aortic Atherosclerosis (ICD10-I70.0). Electronically Signed   By: Genevie Ann M.D.   On: 09/18/2019 20:48    Scheduled Meds:  aspirin EC  81 mg Oral Daily   azithromycin  500 mg Oral q1800   benzonatate  100 mg Oral TID   dextromethorphan  30 mg Oral BID   diltiazem  180 mg Oral QHS   docusate sodium  100 mg Oral QHS   enoxaparin (LOVENOX) injection  40 mg Subcutaneous Q24H   feeding supplement (ENSURE ENLIVE)  237 mL Oral BID BM   fluticasone  2 spray Each Nare BID   guaiFENesin  600 mg Oral BID   insulin aspart  0-9 Units Subcutaneous TID WC   ipratropium-albuterol  3 mL Nebulization BID   loratadine  10 mg Oral Daily   melatonin  10 mg Oral QHS   methylPREDNISolone (SOLU-MEDROL) injection  40 mg Intravenous Daily   mometasone-formoterol  2 puff Inhalation BID   pantoprazole  40 mg Oral Daily   polyethylene glycol  17 g Oral Daily   rosuvastatin  10 mg Oral Q M,W,F   Continuous Infusions:  ampicillin-sulbactam (UNASYN) IV 3 g (09/19/19 1643)   PRN Meds: acetaminophen, albuterol  Time spent: 35 minutes  Author: Berle Mull, MD [facility] 09/19/2019 7:04 PM  To reach On-call, see care teams to locate the attending and reach out via  [email or web address]. Between 7PM-7AM, please contact night-coverage If you still have difficulty reaching the attending provider, please page the [facility] (Director on Call) for [facility] on amion for assistance.

## 2019-09-19 NOTE — Plan of Care (Signed)
Pt is admitted with several weeks of respiratory cough & congestion , increasing SOB, failed outpt OTC & Rx treatment. Dx with bilat pneumonia

## 2019-09-20 LAB — GLUCOSE, CAPILLARY
Glucose-Capillary: 146 mg/dL — ABNORMAL HIGH (ref 70–99)
Glucose-Capillary: 259 mg/dL — ABNORMAL HIGH (ref 70–99)
Glucose-Capillary: 313 mg/dL — ABNORMAL HIGH (ref 70–99)
Glucose-Capillary: 219 mg/dL — ABNORMAL HIGH (ref 70–99)

## 2019-09-20 LAB — EXPECTORATED SPUTUM ASSESSMENT W GRAM STAIN, RFLX TO RESP C

## 2019-09-20 LAB — MISC LABCORP TEST (SEND OUT): Labcorp test code: 139650

## 2019-09-20 LAB — PROCALCITONIN: Procalcitonin: 1.45 ng/mL

## 2019-09-20 MED ORDER — METHYLPREDNISOLONE SODIUM SUCC 40 MG IJ SOLR
40.0000 mg | Freq: Two times a day (BID) | INTRAMUSCULAR | Status: DC
  Administered 2019-09-20 – 2019-09-23 (×8): 40 mg via INTRAVENOUS
  Filled 2019-09-20 (×8): qty 1

## 2019-09-20 MED ORDER — TRAMADOL HCL 50 MG PO TABS
50.0000 mg | ORAL_TABLET | Freq: Four times a day (QID) | ORAL | Status: DC | PRN
  Administered 2019-09-20 – 2019-09-23 (×7): 50 mg via ORAL
  Filled 2019-09-20 (×7): qty 1

## 2019-09-20 MED ORDER — HYDROCOD POLST-CPM POLST ER 10-8 MG/5ML PO SUER
5.0000 mL | Freq: Two times a day (BID) | ORAL | Status: DC
  Administered 2019-09-20 – 2019-09-26 (×13): 5 mL via ORAL
  Filled 2019-09-20 (×13): qty 5

## 2019-09-20 MED ORDER — ONDANSETRON HCL 4 MG/2ML IJ SOLN: 4.0000 mg | Freq: Four times a day (QID) | INTRAMUSCULAR | Status: DC | PRN

## 2019-09-20 MED ORDER — ADULT MULTIVITAMIN W/MINERALS CH
1.0000 | ORAL_TABLET | Freq: Every day | ORAL | Status: DC
  Administered 2019-09-20 – 2019-09-26 (×7): 1 via ORAL
  Filled 2019-09-20 (×7): qty 1

## 2019-09-20 NOTE — Progress Notes (Signed)
Initial Nutrition Assessment  DOCUMENTATION CODES:   Not applicable  INTERVENTION:  Continue Ensure Enlive po BID, each supplement provides 350 kcal and 20 grams of protein (likes strawberry and vanilla) MVI with minerals daily Encouraged po intake of meals and supplements  NUTRITION DIAGNOSIS:   Inadequate oral intake related to acute illness, chronic illness as evidenced by per patient/family report, energy intake < 75% for > 7 days, percent weight loss.  GOAL:   Patient will meet greater than or equal to 90% of their needs  MONITOR:   Supplement acceptance, Weight trends, Labs, I & O's, PO intake  REASON FOR ASSESSMENT:   Malnutrition Screening Tool    ASSESSMENT:  RD working remotely.  85 year old female with past medical history of CAD, PSVT, bronchiectasis, HLD, sarcoidosis, who was recently treated outpatient for pneumonia with antibiotics along with steroids presented with worsening shortness of breath and cough admitted for bronchiectasis flareup secondary to CAP.  Spoke with pt via phone, she recalls eating 100% of oatmeal with brown sugar and blueberry muffin this morning and currently waiting on lunch to arrive (baked chicken and tomato soup). Patient reports that her appetite has not been very good at home in the last 2-3 weeks. She recalls typically eating oatmeal, bran cereals, peanut butter and graham crackers, grilled vegetables, and chicken, but recently none of those have sounded good to her. She drinks Premier protein every day, likes the strawberry and banana flavors. Amenable to drinking vanilla and strawberry Ensure during admission. Per medication review, pt is ordered Ensure BID and has accepted 2 of 3 supplements, however pt reports that she has not received Ensure this admission. Given recent hospital admissions, reported poor oral intake at home, advanced age, as well as ~10 lb (7.2%) wt loss in the last month she is at hgih risk for malnutrition and would  greatly benefit from oral nutrition supplements.   Current wt 116.82 lb Per chart, weights have fluctuated over the past 3 months. On 4/14 pt weighed 52.7 kg (115.94 lb), on 4/22 weights increased to  56.2 kg (123.64 lb), on 6/02 she weighed 57.7 kg (126.94 lb), on 6/22 she weighed 56.2 kg (123.64 lb), on 6/24 weights decreased to 53.8 kg (118.36 lb). Patient reports weights have been up and down over the past few months secondary to multiple hospital admissions, recalls usually weighing around 125-127 lbs.   I/Os: +25 ml since admit UOP: 375 ml since admit Medications reviewed and include: Zithromax, Tessalon, Tussionex, Colace, Miralax, Melatonin, Methylprednisolone, Crestor IVPB: Unasyn  Labs: CBGs 259,210,190, WBC 17.3 (H) 7/29 A1c 5.8  NUTRITION - FOCUSED PHYSICAL EXAM: Unable to complete at this time, RD working remotely.   Diet Order:   Diet Order            Diet Heart Room service appropriate? Yes; Fluid consistency: Thin  Diet effective now                 EDUCATION NEEDS:   Education needs have been addressed  Skin:  Skin Assessment: Reviewed RN Assessment  Last BM:  7/29 -type 4  Height:   Ht Readings from Last 1 Encounters:  09/18/19 5\' 3"  (1.6 m)    Weight:   Wt Readings from Last 1 Encounters:  09/18/19 53.1 kg    Ideal Body Weight:  52.3 kg  BMI:  Body mass index is 20.73 kg/m.  Estimated Nutritional Needs:   Kcal:  1500-1700  Protein:  75-85  Fluid:  >/= 1.4 L  Lajuan Lines, RD, LDN Clinical Nutrition After Hours/Weekend Pager # in [place]

## 2019-09-20 NOTE — Progress Notes (Signed)
[facility] Progress Note  Patient: [name]    WJX:[id number]  DOA: 09/18/2019     Date of Service: the patient was seen and examined on 09/20/2019  Brief hospital course: Past medical history of CAD, PSVT, bronchiectasis, sarcoidosis, HLD. Recently treated for pneumonia outpatient with 7-day treatment course with doxycycline followed by another 7-day treatment course for cefdinir along with steroids.  Continues to come to the hospital with worsening shortness of breath and cough. Currently plan is continue IV antibiotics.  Assessment and Plan: 1.  Community-acquired pneumonia. Bronchiectasis flareup secondary to the pneumonia. Sarcoidosis history. CTA chest negative for any pulmonary embolism. No new findings for sarcoidosis. Persistent bronchiectasis seen. Evidence of bronchopneumonia on right lower lobe as well as on the left side. Procalcitonin elevated.  But trending down. CRP elevated. Follow-up on cultures. Continue with IV antibiotics.  Azithromycin.  Rocephin changed to Unasyn given concern for aspiration. Continue flutter valve. Increase IV steroids Solu-Medrol 40 mg every 12 hours. Add Tussionex. Patient reports pain in her left rib cage after coughing.  Add tramadol.  2.  Hyperglycemia Hemoglobin A1c normal. Likely in the setting of prolonged prednisone course. Monitor.  3.  CAD Continue aspirin  4.  PSVT. Essential hypertension. Continue Cardizem.  5.  HLD Continue statin  6.  GERD Continue PPI Protonix.  Diet: Cardiac diet DVT Prophylaxis:   enoxaparin (LOVENOX) injection 40 mg Start: 09/18/19 2000    Advance goals of care discussion: Full code  Family Communication: family was present at bedside, at the time of interview.  The pt provided permission to discuss medical plan with the family. Opportunity was given to ask question and all questions were answered satisfactorily.   Disposition:  Status is: Inpatient  Remains inpatient  appropriate because:IV treatments appropriate due to intensity of illness or inability to take PO  Dispo: The patient is from: Home              Anticipated d/c is to: Home              Anticipated d/c date is: 2 days              Patient currently is not medically stable to d/c.  Subjective: No nausea no vomiting.  No fever no chills.  Reports pain in her left side of the rib cage.  Excessive coughing at night as well.  Oral intake adequate.  Physical Exam:  General: Appear in mild distress, no Rash; Oral Mucosa Clear, moist. no Abnormal Neck Mass Or lumps, Conjunctiva normal  Cardiovascular: S1 and S2 Present, no Murmur, Respiratory: increased respiratory effort, Bilateral Air entry present and bilateral Crackles, bilateral  wheezes Abdomen: Bowel Sound present, Soft and no tenderness Extremities: no Pedal edema, no calf tenderness Neurology: alert and oriented to time, place, and person affect appropriate. no new focal deficit Gait not checked due to patient safety concerns  Vitals:   09/19/19 2351 09/20/19 0412 09/20/19 0934 09/20/19 1343  BP: (!) 135/52 (!) 140/62  (!) 132/50  Pulse: 71 70  81  Resp: 15 15  15   Temp: 97.8 F (36.6 C) 97.7 F (36.5 C)  97.7 F (36.5 C)  TempSrc:  Oral  Oral  SpO2: 93%  95% 96%  Weight:      Height:        Intake/Output Summary (Last 24 hours) at 09/20/2019 1800 Last data filed at 09/20/2019 0634 Gross per 24 hour  Intake --  Output 375 ml  Net -375 ml  Filed Weights   09/18/19 1643  Weight: 53.1 kg    Data Reviewed: I have personally reviewed and interpreted daily labs, tele strips, imagings as discussed above. I reviewed all nursing notes, pharmacy notes, vitals, pertinent old records I have discussed plan of care as described above with RN and patient/family.  CBC: Recent Labs  Lab 09/18/19 1650 09/19/19 1340  WBC 27.1* 17.3*  NEUTROABS 24.8* 15.0*  HGB 13.5 13.3  HCT 41.4 41.0  MCV 96.7 97.4  PLT 242 030   Basic  Metabolic Panel: Recent Labs  Lab 09/18/19 1650 09/19/19 1340  NA 133* 138  K 4.7 3.7  CL 100 101  CO2 23 27  GLUCOSE 161* 129*  BUN 19 16  CREATININE 0.56 0.62  CALCIUM 8.6* 9.1  MG  --  2.2    Studies: No results found.  Scheduled Meds: . aspirin EC  81 mg Oral Daily  . azithromycin  500 mg Oral q1800  . benzonatate  100 mg Oral TID  . chlorpheniramine-HYDROcodone  5 mL Oral Q12H  . diltiazem  180 mg Oral QHS  . docusate sodium  100 mg Oral QHS  . enoxaparin (LOVENOX) injection  40 mg Subcutaneous Q24H  . feeding supplement (ENSURE ENLIVE)  237 mL Oral BID BM  . fluticasone  2 spray Each Nare BID  . guaiFENesin  600 mg Oral BID  . insulin aspart  0-9 Units Subcutaneous TID WC  . ipratropium-albuterol  3 mL Nebulization BID  . loratadine  10 mg Oral Daily  . melatonin  10 mg Oral QHS  . methylPREDNISolone (SOLU-MEDROL) injection  40 mg Intravenous Q12H  . mometasone-formoterol  2 puff Inhalation BID  . multivitamin with minerals  1 tablet Oral Daily  . pantoprazole  40 mg Oral Daily  . polyethylene glycol  17 g Oral Daily  . rosuvastatin  10 mg Oral Q M,W,F   Continuous Infusions: . ampicillin-sulbactam (UNASYN) IV 3 g (09/20/19 1720)   PRN Meds: acetaminophen, albuterol, ondansetron (ZOFRAN) IV, traMADol  Time spent: 35 minutes  Author: Berle Mull, MD [facility] 09/20/2019 6:00 PM  To reach On-call, see care teams to locate the attending and reach out via [email or web address]. Between 7PM-7AM, please contact night-coverage If you still have difficulty reaching the attending provider, please page the [facility] (Director on Call) for [facility] on amion for assistance.

## 2019-09-20 NOTE — Progress Notes (Signed)
Notified by lab that sputum culture needs recollection. Patient is aware.

## 2019-09-21 LAB — BASIC METABOLIC PANEL
Anion gap: 13 (ref 5–15)
BUN: 16 mg/dL (ref 8–23)
CO2: 22 mmol/L (ref 22–32)
Calcium: 8.7 mg/dL — ABNORMAL LOW (ref 8.9–10.3)
Chloride: 102 mmol/L (ref 98–111)
Creatinine, Ser: 0.53 mg/dL (ref 0.44–1.00)
GFR calc Af Amer: >60 mL/min (ref >60)
GFR calc non Af Amer: >60 mL/min (ref >60)
Glucose, Bld: 190 mg/dL — ABNORMAL HIGH (ref 70–99)
Potassium: 4.2 mmol/L (ref 3.5–5.1)
Sodium: 137 mmol/L (ref 135–145)

## 2019-09-21 LAB — CBC
HCT: 37.9 % (ref 36.0–46.0)
Hemoglobin: 12.4 g/dL (ref 12.0–15.0)
MCH: 32.2 pg (ref 26.0–34.0)
MCHC: 32.7 g/dL (ref 30.0–36.0)
MCV: 98.4 fL (ref 80.0–100.0)
Platelets: 242 10*3/uL (ref 150–400)
RBC: 3.85 MIL/uL — ABNORMAL LOW (ref 3.87–5.11)
RDW: 16.1 % — ABNORMAL HIGH (ref 11.5–15.5)
WBC: 17.4 10*3/uL — ABNORMAL HIGH (ref 4.0–10.5)
nRBC: 0 % (ref 0.0–0.2)

## 2019-09-21 LAB — GLUCOSE, CAPILLARY
Glucose-Capillary: 165 mg/dL — ABNORMAL HIGH (ref 70–99)
Glucose-Capillary: 251 mg/dL — ABNORMAL HIGH (ref 70–99)
Glucose-Capillary: 195 mg/dL — ABNORMAL HIGH (ref 70–99)
Glucose-Capillary: 274 mg/dL — ABNORMAL HIGH (ref 70–99)

## 2019-09-21 LAB — MAGNESIUM: Magnesium: 2 mg/dL (ref 1.7–2.4)

## 2019-09-21 LAB — PROCALCITONIN: Procalcitonin: 0.8 ng/mL

## 2019-09-21 NOTE — Progress Notes (Signed)
[facility] Progress Note  Patient: [name]    EXB:[id number]  DOA: 09/18/2019     Date of Service: the patient was seen and examined on 09/21/2019  Brief hospital course: Past medical history of CAD, PSVT, bronchiectasis, sarcoidosis, HLD. Recently treated for pneumonia outpatient with 7-day treatment course with doxycycline followed by another 7-day treatment course for cefdinir along with steroids.  Continues to come to the hospital with worsening shortness of breath and cough. Currently plan is continue IV antibiotics.  Assessment and Plan: 1.  Community-acquired pneumonia. Bronchiectasis flareup secondary to the pneumonia. Sarcoidosis history. CTA chest negative for any pulmonary embolism. No new findings for sarcoidosis. Persistent bronchiectasis seen. Evidence of bronchopneumonia on right lower lobe as well as on the left side. Procalcitonin elevated.  But trending down. CRP elevated. Follow-up on cultures. Continue with IV antibiotics.  Azithromycin.  Rocephin changed to Unasyn given concern for aspiration. Continue flutter valve. Increase IV steroids Solu-Medrol 40 mg every 12 hours. Add Tussionex. Patient reports pain in her left rib cage after coughing.  Add tramadol.  2.  Hyperglycemia Hemoglobin A1c normal. Likely in the setting of prolonged prednisone course. Monitor.  3.  CAD Continue aspirin  4.  PSVT. Essential hypertension. Continue Cardizem.  5.  HLD Continue statin  6.  GERD Continue PPI Protonix.  7.  Bilateral ear fullness. Examination right ear shows no evidence of fluid behind the drum.  Drum is intact. On the left ear wax plus some amount of clotted blood.  Difficult to see the drum there.  No significant abnormality seen otherwise.  Patient denies any ear pain  Diet: Cardiac diet DVT Prophylaxis:  enoxaparin (LOVENOX) injection 40 mg Start: 09/18/19 2000  Advance goals of care discussion: Full code  Family Communication:  no family was present at bedside, at the time of interview.   Disposition:  Status is: Inpatient  Remains inpatient appropriate because:IV treatments appropriate due to intensity of illness or inability to take PO  Dispo: The patient is from: Home              Anticipated d/c is to: Home              Anticipated d/c date is: 2 days              Patient currently is not medically stable to d/c.  Subjective: Continues to have cough and shortness of breath.  No nausea no vomiting.  Some improvement.  Physical Exam:  General: Appear in mild distress, no Rash; Oral Mucosa Clear, moist. no Abnormal Neck Mass Or lumps, Conjunctiva normal  Cardiovascular: S1 and S2 Present, no Murmur, Respiratory: increased respiratory effort, Bilateral Air entry present and bilateral Crackles, bilateral  wheezes Abdomen: Bowel Sound present, Soft and no tenderness Extremities: no Pedal edema, no calf tenderness Neurology: alert and oriented to time, place, and person affect appropriate. no new focal deficit Gait not checked due to patient safety concerns  Vitals:   09/20/19 2215 09/21/19 0553 09/21/19 0800 09/21/19 1351  BP:  (!) 141/93  (!) 144/70  Pulse:  71  75  Resp:  15  19  Temp:  97.9 F (36.6 C)  (!) 97.5 F (36.4 C)  TempSrc:      SpO2: 97% 96% 95% 97%  Weight:      Height:        Intake/Output Summary (Last 24 hours) at 09/21/2019 1826 Last data filed at 09/21/2019 1651 Gross per 24 hour  Intake 1200 ml  Output 1200 ml  Net 0 ml   Filed Weights   09/18/19 1643  Weight: 53.1 kg    Data Reviewed: I have personally reviewed and interpreted daily labs, tele strips, imagings as discussed above. I reviewed all nursing notes, pharmacy notes, vitals, pertinent old records I have discussed plan of care as described above with RN and patient/family.  CBC: Recent Labs  Lab 09/18/19 1650 09/19/19 1340 09/21/19 0401  WBC 27.1* 17.3* 17.4*  NEUTROABS 24.8* 15.0*  --   HGB 13.5 13.3  12.4  HCT 41.4 41.0 37.9  MCV 96.7 97.4 98.4  PLT 242 251 502   Basic Metabolic Panel: Recent Labs  Lab 09/18/19 1650 09/19/19 1340 09/21/19 0401  NA 133* 138 137  K 4.7 3.7 4.2  CL 100 101 102  CO2 23 27 22   GLUCOSE 161* 129* 190*  BUN 19 16 16   CREATININE 0.56 0.62 0.53  CALCIUM 8.6* 9.1 8.7*  MG  --  2.2 2.0    Studies: No results found.  Scheduled Meds: . aspirin EC  81 mg Oral Daily  . azithromycin  500 mg Oral q1800  . benzonatate  100 mg Oral TID  . chlorpheniramine-HYDROcodone  5 mL Oral Q12H  . diltiazem  180 mg Oral QHS  . docusate sodium  100 mg Oral QHS  . enoxaparin (LOVENOX) injection  40 mg Subcutaneous Q24H  . feeding supplement (ENSURE ENLIVE)  237 mL Oral BID BM  . fluticasone  2 spray Each Nare BID  . guaiFENesin  600 mg Oral BID  . insulin aspart  0-9 Units Subcutaneous TID WC  . ipratropium-albuterol  3 mL Nebulization BID  . loratadine  10 mg Oral Daily  . melatonin  10 mg Oral QHS  . methylPREDNISolone (SOLU-MEDROL) injection  40 mg Intravenous Q12H  . mometasone-formoterol  2 puff Inhalation BID  . multivitamin with minerals  1 tablet Oral Daily  . pantoprazole  40 mg Oral Daily  . polyethylene glycol  17 g Oral Daily  . rosuvastatin  10 mg Oral Q M,W,F   Continuous Infusions: . ampicillin-sulbactam (UNASYN) IV 3 g (09/21/19 1151)   PRN Meds: acetaminophen, albuterol, ondansetron (ZOFRAN) IV, traMADol  Time spent: 35 minutes  Author: Berle Mull, MD [facility] 09/21/2019 6:26 PM  To reach On-call, see care teams to locate the attending and reach out via [email or web address]. Between 7PM-7AM, please contact night-coverage If you still have difficulty reaching the attending provider, please page the [facility] (Director on Call) for [facility] on amion for assistance.

## 2019-09-21 NOTE — Evaluation (Addendum)
Physical Therapy Evaluation Patient Details Name: [name] MRN: [id number] DOB: 01-Aug-1934 Today's Date: 09/21/2019   History of Present Illness  85 yo female with Past medical history of CAD, PSVT, bronchiectasis, sarcoidosis, HLD.Recently treated for pneumonia as an outpatient. Comes to ED 7/28  with worsening shortness of breath and cough.  Clinical Impression  The patient is pleasant and motivated to ambulate. Patient ambulated x 80' using Rw on RA, SPO2 94 5, HR 90. Patient should progress to return home. Pt admitted with above diagnosis. Pt currently with functional limitations due to the deficits listed below (see PT Problem List). Pt will benefit from skilled PT to increase their independence and safety with mobility to allow discharge to the venue listed below.       Follow Up Recommendations Home health PT;Supervision - Intermittent    Equipment Recommendations  None recommended by PT    Recommendations for Other Services       Precautions / Restrictions Precautions Precautions: Fall      Mobility  Bed Mobility Overal bed mobility: Needs Assistance Bed Mobility: Supine to Sit     Supine to sit: Supervision        Transfers Overall transfer level: Needs assistance Equipment used: Rolling walker (2 wheeled) Transfers: Sit to/from Stand Sit to Stand: Min guard         General transfer comment: from bed and toilet and recliner  Ambulation/Gait Ambulation/Gait assistance: Min assist Gait Distance (Feet): 20 Feet (x 2 then 80) Assistive device: Rolling walker (2 wheeled) Gait Pattern/deviations: Step-through pattern Gait velocity: decr   General Gait Details: slow pace, mildly unsteady initially. improved with distance. Noted dyspnea 3/4, expiratory wheeze.  Stairs            Wheelchair Mobility    Modified Rankin (Stroke Patients Only)       Balance Overall balance assessment: Needs assistance Sitting-balance support: No upper  extremity supported;Feet supported Sitting balance-Leahy Scale: Good     Standing balance support: During functional activity;Single extremity supported Standing balance-Leahy Scale: Fair Standing balance comment: requires at least 1 UE support during standing                             Pertinent Vitals/Pain Pain Assessment: Faces Faces Pain Scale: Hurts little more Pain Location: back right side from coughing Pain Descriptors / Indicators: Grimacing;Discomfort;Aching Pain Intervention(s): Limited activity within patient's tolerance;Monitored during session    Home Living Family/patient expects to be discharged to:: Private residence   Available Help at Discharge: Family;Available 24 hours/day Type of Home: House Home Access: Stairs to enter   [employer] of Steps: 1 Home Layout: One level Home Equipment: Bedside commode;Shower seat;Walker - 2 wheels Additional Comments: lives with daughter and son in [place]. daughter works from home.    Prior Function Level of Independence: Independent         Comments: drives, walks without AD until recently when became sick     Hand Dominance   Dominant Hand: Right    Extremity/Trunk Assessment   Upper Extremity Assessment Upper Extremity Assessment: Generalized weakness    Lower Extremity Assessment Lower Extremity Assessment: Generalized weakness    Cervical / Trunk Assessment Cervical / Trunk Assessment: Kyphotic  Communication   Communication: No difficulties  Cognition Arousal/Alertness: Awake/alert Behavior During Therapy: WFL for tasks assessed/performed Overall Cognitive Status: Within Functional Limits for tasks assessed  General Comments      Exercises     Assessment/Plan    PT Assessment Patient needs continued PT services  PT Problem List Decreased strength;Decreased mobility;Decreased knowledge of precautions;Decreased  activity tolerance;Decreased knowledge of use of DME       PT Treatment Interventions DME instruction;Therapeutic activities;Gait training;Therapeutic exercise;Patient/family education;Functional mobility training    PT Goals (Current goals can be found in the Care Plan section)  Acute Rehab PT Goals Patient Stated Goal: to go home PT Goal Formulation: With patient Time For Goal Achievement: 10/04/19 Potential to Achieve Goals: Good    Frequency Min 3X/week   Barriers to discharge        Co-evaluation               AM-PAC PT "6 Clicks" Mobility  Outcome Measure Help needed turning from your back to your side while in a flat bed without using bedrails?: None Help needed moving from lying on your back to sitting on the side of a flat bed without using bedrails?: None Help needed moving to and from a bed to a chair (including a wheelchair)?: A Little Help needed standing up from a chair using your arms (e.g., wheelchair or bedside chair)?: A Little Help needed to walk in hospital room?: A Little Help needed climbing 3-5 steps with a railing? : A Little 6 Click Score: 20    End of Session Equipment Utilized During Treatment: Gait belt Activity Tolerance: Patient tolerated treatment well Patient left: in chair;with call bell/phone within reach;with chair alarm set Nurse Communication: Mobility status PT Visit Diagnosis: Unsteadiness on feet (R26.81);Pain    Time: 7622-6333 PT Time Calculation (min) (ACUTE ONLY): 44 min   Charges:   PT Evaluation $PT Eval Low Complexity: 1 Low PT Treatments $Gait Training: 8-22 mins $Self Care/Home Management: [place] Pager [phone] Office [phone]   Claretha Cooper 09/21/2019, 10:51 AM

## 2019-09-22 LAB — BASIC METABOLIC PANEL
Anion gap: 7 (ref 5–15)
BUN: 26 mg/dL — ABNORMAL HIGH (ref 8–23)
CO2: 26 mmol/L (ref 22–32)
Calcium: 8.7 mg/dL — ABNORMAL LOW (ref 8.9–10.3)
Chloride: 101 mmol/L (ref 98–111)
Creatinine, Ser: 0.61 mg/dL (ref 0.44–1.00)
GFR calc Af Amer: >60 mL/min (ref >60)
GFR calc non Af Amer: >60 mL/min (ref >60)
Glucose, Bld: 112 mg/dL — ABNORMAL HIGH (ref 70–99)
Potassium: 4.5 mmol/L (ref 3.5–5.1)
Sodium: 134 mmol/L — ABNORMAL LOW (ref 135–145)

## 2019-09-22 LAB — CBC
HCT: 41.4 % (ref 36.0–46.0)
Hemoglobin: 13.3 g/dL (ref 12.0–15.0)
MCH: 31.3 pg (ref 26.0–34.0)
MCHC: 32.1 g/dL (ref 30.0–36.0)
MCV: 97.4 fL (ref 80.0–100.0)
Platelets: 298 10*3/uL (ref 150–400)
RBC: 4.25 MIL/uL (ref 3.87–5.11)
RDW: 16.1 % — ABNORMAL HIGH (ref 11.5–15.5)
WBC: 20.1 10*3/uL — ABNORMAL HIGH (ref 4.0–10.5)
nRBC: 0 % (ref 0.0–0.2)

## 2019-09-22 LAB — GLUCOSE, CAPILLARY
Glucose-Capillary: 136 mg/dL — ABNORMAL HIGH (ref 70–99)
Glucose-Capillary: 203 mg/dL — ABNORMAL HIGH (ref 70–99)
Glucose-Capillary: 320 mg/dL — ABNORMAL HIGH (ref 70–99)
Glucose-Capillary: 222 mg/dL — ABNORMAL HIGH (ref 70–99)

## 2019-09-22 MED ORDER — ALUM & MAG HYDROXIDE-SIMETH 200-200-20 MG/5ML PO SUSP
30.0000 mL | Freq: Four times a day (QID) | ORAL | Status: DC | PRN
  Administered 2019-09-22 – 2019-09-25 (×2): 30 mL via ORAL
  Filled 2019-09-22 (×2): qty 30

## 2019-09-22 MED ORDER — SODIUM CHLORIDE 3 % IN NEBU: 4.0000 mL | INHALATION_SOLUTION | Freq: Every day | RESPIRATORY_TRACT | Status: DC

## 2019-09-22 MED ORDER — AMOXICILLIN-POT CLAVULANATE 875-125 MG PO TABS
1.0000 | ORAL_TABLET | Freq: Two times a day (BID) | ORAL | Status: AC
  Administered 2019-09-22 – 2019-09-25 (×8): 1 via ORAL
  Filled 2019-09-22 (×8): qty 1

## 2019-09-22 MED ORDER — NYSTATIN 100000 UNIT/ML MT SUSP
5.0000 mL | Freq: Four times a day (QID) | OROMUCOSAL | Status: DC
  Administered 2019-09-22 – 2019-09-26 (×16): 500000 [IU] via ORAL
  Filled 2019-09-22 (×15): qty 5

## 2019-09-22 MED ORDER — SODIUM CHLORIDE 3 % IN NEBU
4.0000 mL | INHALATION_SOLUTION | Freq: Every day | RESPIRATORY_TRACT | Status: AC
  Administered 2019-09-23 – 2019-09-24 (×2): 4 mL via RESPIRATORY_TRACT
  Filled 2019-09-22 (×3): qty 4

## 2019-09-22 NOTE — Progress Notes (Signed)
[facility] Progress Note  Patient: [name]    QPR:[id number]  DOA: 09/18/2019     Date of Service: the patient was seen and examined on 09/22/2019  Brief hospital course: Past medical history of CAD, PSVT, bronchiectasis, sarcoidosis, HLD. Recently treated for pneumonia outpatient with 7-day treatment course with doxycycline followed by another 7-day treatment course for cefdinir along with steroids.  Continues to come to the hospital with worsening shortness of breath and cough. Currently plan is continue IV antibiotics.  Assessment and Plan: 1.  Community-acquired pneumonia. Bronchiectasis flareup secondary to the pneumonia. Sarcoidosis history. CTA chest negative for any pulmonary embolism. No new findings for sarcoidosis. Persistent bronchiectasis seen. Evidence of bronchopneumonia on right lower lobe as well as on the left side. Procalcitonin elevated.  But trending down. CRP elevated. Follow-up on cultures. Continue with IV antibiotics.  Azithromycin.  Rocephin changed to Unasyn given concern for aspiration. Continue flutter valve. Increase IV steroids Solu-Medrol 40 mg every 12 hours. Add Tussionex. Add hypertonic saline nebulizers.  Continue flutter valve. Patient reports pain in her left rib cage after coughing.  Add tramadol.  2.  Hyperglycemia Hemoglobin A1c normal. Likely in the setting of prolonged prednisone course. Monitor.  3.  CAD Continue aspirin  4.  PSVT. Essential hypertension. Continue Cardizem.  5.  HLD Continue statin  6.  GERD Continue PPI Protonix.  7.  Bilateral ear fullness. Examination right ear shows no evidence of fluid behind the drum.  Drum is intact. On the left ear wax plus some amount of clotted blood.  Difficult to see the drum there.  No significant abnormality seen otherwise.  Patient denies any ear pain  8.  Oral thrush. Add nystatin.  Diet: Cardiac diet DVT Prophylaxis:  enoxaparin (LOVENOX) injection 40 mg  Start: 09/18/19 2000  Advance goals of care discussion: Full code  Family Communication: no family was present at bedside, at the time of interview.   Disposition:  Status is: Inpatient  Remains inpatient appropriate because:IV treatments appropriate due to intensity of illness or inability to take PO  Dispo: The patient is from: Home              Anticipated d/c is to: Home              Anticipated d/c date is: 2 days              Patient currently is not medically stable to d/c.  Subjective: Cough is better.  Continues to have shortness of breath.  No nausea no vomiting.  Oral intake adequate.  Physical Exam:  General: Appear in mild distress, no Rash; Oral Mucosa shows thrush. no Abnormal Neck Mass Or lumps, Conjunctiva normal  Cardiovascular: S1 and S2 Present, no Murmur, Respiratory: increased respiratory effort, Bilateral Air entry present and bilateral Crackles, bilateral  wheezes Abdomen: Bowel Sound present, Soft and no tenderness Extremities: no Pedal edema, no calf tenderness Neurology: alert and oriented to time, place, and person affect appropriate. no new focal deficit Gait not checked due to patient safety concerns  Vitals:   09/21/19 2017 09/22/19 0459 09/22/19 0901 09/22/19 1508  BP:  (!) 146/70  (!) 128/51  Pulse:  78  78  Resp:  16  19  Temp:  97.9 F (36.6 C)  98.4 F (36.9 C)  TempSrc:    Oral  SpO2: 95% 98% 95% 95%  Weight:      Height:        Intake/Output Summary (Last 24 hours)  at 09/22/2019 1739 Last data filed at 09/22/2019 1300 Gross per 24 hour  Intake 960 ml  Output 600 ml  Net 360 ml   Filed Weights   09/18/19 1643  Weight: 53.1 kg    Data Reviewed: I have personally reviewed and interpreted daily labs, tele strips, imagings as discussed above. I reviewed all nursing notes, pharmacy notes, vitals, pertinent old records I have discussed plan of care as described above with RN and patient/family.  CBC: Recent Labs  Lab  09/18/19 1650 09/19/19 1340 09/21/19 0401 09/22/19 0427  WBC 27.1* 17.3* 17.4* 20.1*  NEUTROABS 24.8* 15.0*  --   --   HGB 13.5 13.3 12.4 13.3  HCT 41.4 41.0 37.9 41.4  MCV 96.7 97.4 98.4 97.4  PLT 242 251 242 867   Basic Metabolic Panel: Recent Labs  Lab 09/18/19 1650 09/19/19 1340 09/21/19 0401 09/22/19 0427  NA 133* 138 137 134*  K 4.7 3.7 4.2 4.5  CL 100 101 102 101  CO2 23 27 22 26   GLUCOSE 161* 129* 190* 112*  BUN 19 16 16  26*  CREATININE 0.56 0.62 0.53 0.61  CALCIUM 8.6* 9.1 8.7* 8.7*  MG  --  2.2 2.0  --     Studies: No results found.  Scheduled Meds: . amoxicillin-clavulanate  1 tablet Oral Q12H  . aspirin EC  81 mg Oral Daily  . benzonatate  100 mg Oral TID  . chlorpheniramine-HYDROcodone  5 mL Oral Q12H  . diltiazem  180 mg Oral QHS  . docusate sodium  100 mg Oral QHS  . enoxaparin (LOVENOX) injection  40 mg Subcutaneous Q24H  . feeding supplement (ENSURE ENLIVE)  237 mL Oral BID BM  . fluticasone  2 spray Each Nare BID  . guaiFENesin  600 mg Oral BID  . insulin aspart  0-9 Units Subcutaneous TID WC  . ipratropium-albuterol  3 mL Nebulization BID  . loratadine  10 mg Oral Daily  . melatonin  10 mg Oral QHS  . methylPREDNISolone (SOLU-MEDROL) injection  40 mg Intravenous Q12H  . mometasone-formoterol  2 puff Inhalation BID  . multivitamin with minerals  1 tablet Oral Daily  . nystatin  5 mL Oral QID  . pantoprazole  40 mg Oral Daily  . polyethylene glycol  17 g Oral Daily  . rosuvastatin  10 mg Oral Q M,W,F  . sodium chloride HYPERTONIC  4 mL Nebulization Daily   Continuous Infusions:  PRN Meds: acetaminophen, albuterol, ondansetron (ZOFRAN) IV, traMADol  Time spent: 35 minutes  Author: Berle Mull, MD [facility] 09/22/2019 5:39 PM  To reach On-call, see care teams to locate the attending and reach out via [email or web address]. Between 7PM-7AM, please contact night-coverage If you still have difficulty reaching the attending provider,  please page the [facility] (Director on Call) for [facility] on amion for assistance.

## 2019-09-23 ENCOUNTER — Encounter (HOSPITAL_COMMUNITY): Payer: Self-pay | Admitting: Internal Medicine

## 2019-09-23 ENCOUNTER — Inpatient Hospital Stay (HOSPITAL_COMMUNITY): Payer: Medicare Other

## 2019-09-23 DIAGNOSIS — I4891 Unspecified atrial fibrillation: Secondary | ICD-10-CM

## 2019-09-23 LAB — CBC
HCT: 41.9 % (ref 36.0–46.0)
Hemoglobin: 13.4 g/dL (ref 12.0–15.0)
MCH: 31.3 pg (ref 26.0–34.0)
MCHC: 32 g/dL (ref 30.0–36.0)
MCV: 97.9 fL (ref 80.0–100.0)
Platelets: 253 10*3/uL (ref 150–400)
RBC: 4.28 MIL/uL (ref 3.87–5.11)
RDW: 16.1 % — ABNORMAL HIGH (ref 11.5–15.5)
WBC: 17.3 10*3/uL — ABNORMAL HIGH (ref 4.0–10.5)
nRBC: 0 % (ref 0.0–0.2)

## 2019-09-23 LAB — ECHOCARDIOGRAM COMPLETE
Area-P 1/2: 2.91 cm2
Height: 63 in
S' Lateral: 3.1 cm
Weight: 1872 oz

## 2019-09-23 LAB — BASIC METABOLIC PANEL
Anion gap: 9 (ref 5–15)
BUN: 29 mg/dL — ABNORMAL HIGH (ref 8–23)
CO2: 26 mmol/L (ref 22–32)
Calcium: 8.6 mg/dL — ABNORMAL LOW (ref 8.9–10.3)
Chloride: 99 mmol/L (ref 98–111)
Creatinine, Ser: 0.68 mg/dL (ref 0.44–1.00)
GFR calc Af Amer: >60 mL/min (ref >60)
GFR calc non Af Amer: >60 mL/min (ref >60)
Glucose, Bld: 172 mg/dL — ABNORMAL HIGH (ref 70–99)
Potassium: 4.8 mmol/L (ref 3.5–5.1)
Sodium: 134 mmol/L — ABNORMAL LOW (ref 135–145)

## 2019-09-23 LAB — CULTURE, BLOOD (ROUTINE X 2)
Culture: NO GROWTH
Culture: NO GROWTH
Special Requests: ADEQUATE

## 2019-09-23 LAB — GLUCOSE, CAPILLARY
Glucose-Capillary: 122 mg/dL — ABNORMAL HIGH (ref 70–99)
Glucose-Capillary: 221 mg/dL — ABNORMAL HIGH (ref 70–99)
Glucose-Capillary: 242 mg/dL — ABNORMAL HIGH (ref 70–99)
Glucose-Capillary: 291 mg/dL — ABNORMAL HIGH (ref 70–99)

## 2019-09-23 LAB — HEPARIN LEVEL (UNFRACTIONATED)
Heparin Unfractionated: 0.69 IU/mL (ref 0.30–0.70)
Heparin Unfractionated: 0.47 IU/mL (ref 0.30–0.70)

## 2019-09-23 LAB — TSH: TSH: 0.787 u[IU]/mL (ref 0.350–4.500)

## 2019-09-23 LAB — TROPONIN I (HIGH SENSITIVITY): Troponin I (High Sensitivity): 10 ng/L (ref <18)

## 2019-09-23 MED ORDER — DILTIAZEM HCL 25 MG/5ML IV SOLN
10.0000 mg | Freq: Once | INTRAVENOUS | Status: AC
  Administered 2019-09-23: 10 mg via INTRAVENOUS
  Filled 2019-09-23: qty 5

## 2019-09-23 MED ORDER — HEPARIN (PORCINE) 25000 UT/250ML-% IV SOLN
750.0000 [IU]/h | INTRAVENOUS | Status: DC
  Administered 2019-09-23: 750 [IU]/h via INTRAVENOUS
  Filled 2019-09-23: qty 250

## 2019-09-23 MED ORDER — HEPARIN (PORCINE) 25000 UT/250ML-% IV SOLN: 650.0000 [IU]/h | INTRAVENOUS | Status: DC

## 2019-09-23 MED ORDER — DILTIAZEM LOAD VIA INFUSION: 10.0000 mg | Freq: Once | INTRAVENOUS | Status: DC

## 2019-09-23 NOTE — Progress Notes (Signed)
ANTICOAGULATION CONSULT NOTE - Initial Consult  Pharmacy Consult for IV heparin Indication: atrial fibrillation  Allergies  Allergen Reactions  . Levaquin [Levofloxacin] Other (See Comments)    Leg pain .Marland Kitchen Per doctor not to take again  . Sulfonamide Derivatives     Pt unsure  . Oxycodone Nausea Only    Can take oxycodone with Zofran  . Morphine Nausea Only    Patient Measurements: Height: 5\' 3"  (160 cm) Weight: 53.1 kg (117 lb) IBW/kg (Calculated) : 52.4 Heparin Dosing Weight:    Vital Signs: Temp: 97.6 F (36.4 C) (08/02 0527) Temp Source: Oral (08/02 0527) BP: 123/56 (08/02 0527) Pulse Rate: 64 (08/02 0527)  Labs: Recent Labs    09/21/19 0401 09/21/19 0401 09/22/19 0427 09/23/19 0439  HGB 12.4   < > 13.3 13.4  HCT 37.9  --  41.4 41.9  PLT 242  --  298 253  CREATININE 0.53  --  0.61 0.68  TROPONINIHS  --   --   --  10   < > = values in this interval not displayed.    Estimated Creatinine Clearance: 44.1 mL/min (by C-G formula based on SCr of 0.68 mg/dL).   Medical History: Past Medical History:  Diagnosis Date  . Allergy    SEASONAL  . Anemia   . Arthritis   . Asthma    "slight"   . Baker's cyst of knee, right   . CAD (coronary artery disease)    a. Myoview 10/15 - normal EF 70% // Myoview 11/16: EF 75%, normal perfusion, (there was no blood pressure drop at low level exercise with Lexiscan infusion), low risk study // c. LHC 3/17 - LAD irregs, oLCx 70 (neg FFR), mRCA 30, EF 55-65% >> med Rx // Myoview 07/2018:  EF 73, extracardiac uptake, no significant ischemia (reviewed with Dr. Burt Knack), Low Risk   . Carotid stenosis    a. Carotid US 9/15 - Bilateral 1-39% ICA >> FU 2 years // b. Bilateral ICA 1-39 >> FU prn // Carotid US 06/2018: bilat 1-39; fu prn  . Dyspnea    due to Sarcoidosis. When is windy or humed  . Dysrhythmia    fast heart rate  . Elbow fracture 04/2017   Right, had surgery  . Esophageal reflux   . Hematochezia   . History of  echocardiogram    a. Echo 10/15 - EF 60-65%, no RWMA  . HLD (hyperlipidemia)   . Hx of colonoscopy   . Osteoporosis   . Other chronic pulmonary heart diseases   . Paroxysmal supraventricular tachycardia ([place])   . Pneumonia   . Sarcoidosis     Medications:  Scheduled:  . amoxicillin-clavulanate  1 tablet Oral Q12H  . aspirin EC  81 mg Oral Daily  . benzonatate  100 mg Oral TID  . chlorpheniramine-HYDROcodone  5 mL Oral Q12H  . diltiazem  180 mg Oral QHS  . docusate sodium  100 mg Oral QHS  . feeding supplement (ENSURE ENLIVE)  237 mL Oral BID BM  . fluticasone  2 spray Each Nare BID  . guaiFENesin  600 mg Oral BID  . insulin aspart  0-9 Units Subcutaneous TID WC  . ipratropium-albuterol  3 mL Nebulization BID  . loratadine  10 mg Oral Daily  . melatonin  10 mg Oral QHS  . methylPREDNISolone (SOLU-MEDROL) injection  40 mg Intravenous Q12H  . mometasone-formoterol  2 puff Inhalation BID  . multivitamin with minerals  1 tablet Oral Daily  .  nystatin  5 mL Oral QID  . pantoprazole  40 mg Oral Daily  . polyethylene glycol  17 g Oral Daily  . rosuvastatin  10 mg Oral Q M,W,F  . sodium chloride HYPERTONIC  4 mL Nebulization Daily    Assessment: Pharmacy is consulted to dose heparin in 85 yo female diagnmosed with atrial fibrillation. Pt not on any blood thinners PTA. Pt previously on enoxaparin 40 mg daily. Last dose on 8/1 at 2126.   Today, 09/23/19  Hgb 13.4, plt 253   Scr 0.8 mg/dl     Goal of Therapy:  Heparin level 0.3-0.7 units/ml Monitor platelets by anticoagulation protocol: Yes   Plan:   As pt has received enoxaparin last night and with low body weight, will avoid bolus   Start heparin drip at 750 units/hr  Obtain HL 8 hours after start of infusion   Daily CBC while on heparin   Monitor for signs and symptoms of bleeding   Royetta Asal, PharmD, BCPS 09/23/2019 5:33 AM

## 2019-09-23 NOTE — Progress Notes (Signed)
°   09/23/19 0108  Vitals  Temp 98.1 F (36.7 C)  Temp Source Oral  BP (!) 133/78  MAP (mmHg) 94  BP Location Left Arm  BP Method Automatic  Patient Position (if appropriate) Lying  Pulse Rate (!) 129  Pulse Rate Source Monitor  Resp 20  Level of Consciousness  Level of Consciousness Alert  MEWS COLOR  MEWS Score Color Yellow  Oxygen Therapy  SpO2 96 %  O2 Device Room Air  Pain Assessment  Pain Score 0  MEWS Score  MEWS Temp 0  MEWS Systolic 0  MEWS Pulse 2  MEWS RR 0  MEWS LOC 0  MEWS Score 2  Now in [place], pt has been asleep, asymptomatic with Afib HR . Amion text page to Dr Hal Hope- EKG ordered, cardizem ordered. Will continue to monitor per [place] protocol

## 2019-09-23 NOTE — Care Management Important Message (Signed)
Important Message  Patient Details IM Letter given to the Patient Name: BRONNIE VASSEUR MRN: [id number] Date of Birth: 03-12-1934   Medicare Important Message Given:  Yes     Kerin Salen 09/23/2019, 12:48 PM

## 2019-09-23 NOTE — Progress Notes (Signed)
[facility] Progress Note  Patient: [name]    GYI:[id number]  DOA: 09/18/2019     Date of Service: the patient was seen and examined on 09/23/2019  Brief hospital course: Past medical history of CAD, PSVT, bronchiectasis, sarcoidosis, HLD. Recently treated for pneumonia outpatient with 7-day treatment course with doxycycline followed by another 7-day treatment course for cefdinir along with steroids.  Continues to come to the hospital with worsening shortness of breath and cough. Currently plan is continue antibiotics.  Assessment and Plan: 1.  Community-acquired pneumonia. Bronchiectasis flareup secondary to the pneumonia. Sarcoidosis history. CTA chest negative for any pulmonary embolism. No new findings for sarcoidosis. Persistent bronchiectasis seen. Evidence of bronchopneumonia on right lower lobe as well as on the left side. Procalcitonin elevated.  But trending down. CRP elevated. Continue flutter valve. Increase IV steroids Solu-Medrol 40 mg every 12 hours. Add Tussionex. Add hypertonic saline nebulizers.  Continue flutter valve. Patient reports pain in her left rib cage after coughing.  Add tramadol. Continue antibiotics by p.o.  2.  Hyperglycemia Hemoglobin A1c normal. Likely in the setting of prolonged prednisone course. Monitor.  3.  CAD Continue aspirin  4.  PSVT. Essential hypertension. Continue Cardizem.  5.  HLD Continue statin  6.  GERD Continue PPI Protonix.  7.  Bilateral ear fullness. Examination right ear shows no evidence of fluid behind the drum.  Drum is intact. On the left ear wax plus some amount of clotted blood.  Difficult to see the drum there.  No significant abnormality seen otherwise.  Patient denies any ear pain  8.  Oral thrush. Add nystatin.  9. A. fib with RVR. Overnight patient had an episode of A. fib with RVR requiring IV Cardizem. Currently on IV heparin. Cardiology consulted.  Further work-up initiated.   Will monitor.  Suspect that this is patient's chronic issue which was identified acutely while she is in the hospital on telemetry.  Diet: Cardiac diet DVT Prophylaxis:    Advance goals of care discussion: Full code  Family Communication: no family was present at bedside, at the time of interview.   Disposition:  Status is: Inpatient  Remains inpatient appropriate because:IV treatments appropriate due to intensity of illness or inability to take PO  Dispo: The patient is from: Home              Anticipated d/c is to: Home              Anticipated d/c date is: 2 days              Patient currently is not medically stable to d/c.  Subjective: No nausea no vomiting pain currently improving.  No fever no chills.  No chest tightness.  Physical Exam:  General: Appear in mild distress, no Rash; Oral Mucosa shows thrush. no Abnormal Neck Mass Or lumps, Conjunctiva normal  Cardiovascular: S1 and S2 Present, no Murmur, Respiratory: increased respiratory effort, Bilateral Air entry present and bilateral Crackles, bilateral  wheezes Abdomen: Bowel Sound present, Soft and no tenderness Extremities: no Pedal edema, no calf tenderness Neurology: alert and oriented to time, place, and person affect appropriate. no new focal deficit Gait not checked due to patient safety concerns  Vitals:   09/23/19 0527 09/23/19 0815 09/23/19 0942 09/23/19 1300  BP: (!) 123/56  (!) 134/60 (!) 141/65  Pulse: 64  80 74  Resp: 14  16 16   Temp: 97.6 F (36.4 C)  98 F (36.7 C) 98 F (36.7 C)  TempSrc: Oral  Oral Oral  SpO2: 100% 99% 97% 99%  Weight:      Height:        Intake/Output Summary (Last 24 hours) at 09/23/2019 1929 Last data filed at 09/23/2019 8469 Gross per 24 hour  Intake 210 ml  Output 400 ml  Net -190 ml   Filed Weights   09/18/19 1643  Weight: 53.1 kg    Data Reviewed: I have personally reviewed and interpreted daily labs, tele strips, imagings as discussed above. I reviewed all  nursing notes, pharmacy notes, vitals, pertinent old records I have discussed plan of care as described above with RN and patient/family.  CBC: Recent Labs  Lab 09/18/19 1650 09/19/19 1340 09/21/19 0401 09/22/19 0427 09/23/19 0439  WBC 27.1* 17.3* 17.4* 20.1* 17.3*  NEUTROABS 24.8* 15.0*  --   --   --   HGB 13.5 13.3 12.4 13.3 13.4  HCT 41.4 41.0 37.9 41.4 41.9  MCV 96.7 97.4 98.4 97.4 97.9  PLT 242 251 242 298 629   Basic Metabolic Panel: Recent Labs  Lab 09/18/19 1650 09/19/19 1340 09/21/19 0401 09/22/19 0427 09/23/19 0439  NA 133* 138 137 134* 134*  K 4.7 3.7 4.2 4.5 4.8  CL 100 101 102 101 99  CO2 23 27 22 26 26   GLUCOSE 161* 129* 190* 112* 172*  BUN 19 16 16  26* 29*  CREATININE 0.56 0.62 0.53 0.61 0.68  CALCIUM 8.6* 9.1 8.7* 8.7* 8.6*  MG  --  2.2 2.0  --   --     Studies: ECHOCARDIOGRAM COMPLETE  Result Date: 09/23/2019    ECHOCARDIOGRAM REPORT   Patient Name:   [name] Date of Exam: 09/23/2019 Medical Rec #:  [id number]        Height:       63.0 in Accession #:    [id number]       Weight:       117.0 lb Date of Birth:  09-07-1934        BSA:          1.540 m Patient Age:    85 years         BP:           134/60 mmHg Patient Gender: F                HR:           68 bpm. Exam Location:  Inpatient Procedure: 2D Echo, Color Doppler and Cardiac Doppler Indications:    I48.91* Unspecified atrial fibrillation  History:        Patient has prior history of Echocardiogram examinations, most                 recent 07/19/2019. CAD; Risk Factors:Hypertension and                 Dyslipidemia.  Sonographer:    Raquel Sarna Senior RDCS Referring Phys: [place]  Sonographer Comments: Difficult parasternals, very medial windows. IMPRESSIONS  1. Left ventricular ejection fraction, by estimation, is 55 to 60%. The left ventricle has normal function. The left ventricle has no regional wall motion abnormalities. Left ventricular diastolic parameters are consistent with Grade I  diastolic dysfunction (impaired relaxation).  2. Right ventricular systolic function is normal. The right ventricular size is normal. There is normal pulmonary artery systolic pressure. The estimated right ventricular systolic pressure is 44.0 mmHg.  3. The mitral valve is degenerative. Trivial mitral valve regurgitation. No evidence  of mitral stenosis.  4. The aortic valve is grossly normal. Aortic valve regurgitation is not visualized. No aortic stenosis is present.  5. The inferior vena cava is normal in size with greater than 50% respiratory variability, suggesting right atrial pressure of 3 mmHg. FINDINGS  Left Ventricle: Left ventricular ejection fraction, by estimation, is 55 to 60%. The left ventricle has normal function. The left ventricle has no regional wall motion abnormalities. The left ventricular internal cavity size was normal in size. There is  no left ventricular hypertrophy. Left ventricular diastolic parameters are consistent with Grade I diastolic dysfunction (impaired relaxation). Right Ventricle: The right ventricular size is normal. No increase in right ventricular wall thickness. Right ventricular systolic function is normal. There is normal pulmonary artery systolic pressure. The tricuspid regurgitant velocity is 2.30 m/s, and  with an assumed right atrial pressure of 3 mmHg, the estimated right ventricular systolic pressure is 86.5 mmHg. Left Atrium: Left atrial size was normal in size. Right Atrium: Right atrial size was normal in size. Pericardium: There is no evidence of pericardial effusion. Mitral Valve: The mitral valve is degenerative in appearance. Normal mobility of the mitral valve leaflets. Mild mitral annular calcification. Trivial mitral valve regurgitation. No evidence of mitral valve stenosis. Tricuspid Valve: The tricuspid valve is normal in structure. Tricuspid valve regurgitation is not demonstrated. No evidence of tricuspid stenosis. Aortic Valve: The aortic valve is  grossly normal.. There is moderate thickening of the aortic valve. Aortic valve regurgitation is not visualized. No aortic stenosis is present. There is moderate thickening of the aortic valve. Pulmonic Valve: The pulmonic valve was normal in structure. Pulmonic valve regurgitation is not visualized. No evidence of pulmonic stenosis. Aorta: The aortic root is normal in size and structure. Venous: The inferior vena cava is normal in size with greater than 50% respiratory variability, suggesting right atrial pressure of 3 mmHg. IAS/Shunts: No atrial level shunt detected by color flow Doppler.  LEFT VENTRICLE PLAX 2D LVIDd:         4.50 cm  Diastology LVIDs:         3.10 cm  LV e' lateral:   8.38 cm/s LV PW:         0.70 cm  LV E/e' lateral: 7.6 LV IVS:        1.00 cm  LV e' medial:    7.29 cm/s LVOT diam:     2.00 cm  LV E/e' medial:  8.7 LV SV:         54 LV SV Index:   35 LVOT Area:     3.14 cm  RIGHT VENTRICLE RV S prime:     17.00 cm/s TAPSE (M-mode): 2.0 cm LEFT ATRIUM             Index       RIGHT ATRIUM           Index LA diam:        3.30 cm 2.14 cm/m  RA Area:     14.40 cm LA Vol (A2C):   44.0 ml 28.58 ml/m RA Volume:   32.20 ml  20.91 ml/m LA Vol (A4C):   48.4 ml 31.44 ml/m LA Biplane Vol: 47.0 ml 30.53 ml/m  AORTIC VALVE LVOT Vmax:   84.20 cm/s LVOT Vmean:  57.200 cm/s LVOT VTI:    0.171 m  AORTA Ao Root diam: 2.70 cm MITRAL VALVE               TRICUSPID VALVE MV Area (  PHT): 2.91 cm    TR Peak grad:   21.2 mmHg MV Decel Time: 261 msec    TR Vmax:        230.00 cm/s MV E velocity: 63.40 cm/s MV A velocity: 76.30 cm/s  SHUNTS MV E/A ratio:  0.83        Systemic VTI:  0.17 m                            Systemic Diam: 2.00 cm Cherlynn Kaiser MD Electronically signed by Cherlynn Kaiser MD Signature Date/Time: 09/23/2019/7:21:29 PM    Final     Scheduled Meds: . amoxicillin-clavulanate  1 tablet Oral Q12H  . aspirin EC  81 mg Oral Daily  . benzonatate  100 mg Oral TID  . chlorpheniramine-HYDROcodone  5  mL Oral Q12H  . diltiazem  180 mg Oral QHS  . docusate sodium  100 mg Oral QHS  . feeding supplement (ENSURE ENLIVE)  237 mL Oral BID BM  . fluticasone  2 spray Each Nare BID  . guaiFENesin  600 mg Oral BID  . insulin aspart  0-9 Units Subcutaneous TID WC  . ipratropium-albuterol  3 mL Nebulization BID  . loratadine  10 mg Oral Daily  . melatonin  10 mg Oral QHS  . methylPREDNISolone (SOLU-MEDROL) injection  40 mg Intravenous Q12H  . mometasone-formoterol  2 puff Inhalation BID  . multivitamin with minerals  1 tablet Oral Daily  . nystatin  5 mL Oral QID  . pantoprazole  40 mg Oral Daily  . polyethylene glycol  17 g Oral Daily  . rosuvastatin  10 mg Oral Q M,W,F  . sodium chloride HYPERTONIC  4 mL Nebulization Daily   Continuous Infusions: . heparin 650 Units/hr (09/23/19 1506)   PRN Meds: acetaminophen, albuterol, alum & mag hydroxide-simeth, ondansetron (ZOFRAN) IV, traMADol  Time spent: 35 minutes  Author: Berle Mull, MD [facility] 09/23/2019 7:29 PM  To reach On-call, see care teams to locate the attending and reach out via [email or web address]. Between 7PM-7AM, please contact night-coverage If you still have difficulty reaching the attending provider, please page the [facility] (Director on Call) for [facility] on amion for assistance.

## 2019-09-23 NOTE — Progress Notes (Signed)
Pts HR is improved (<100) but still in afib after IV Diltiazem 10mg , pt has remained asymptomatic. O2 was applied at 1Lnc. PRN

## 2019-09-23 NOTE — Progress Notes (Signed)
Physical Therapy Treatment Patient Details Name: [name] MRN: [id number] DOB: 1934-03-30 Today's Date: 09/23/2019    History of Present Illness Past medical history of CAD, PSVT, bronchiectasis, sarcoidosis, HLD.Recently treated for pneumonia outpatient. Comes to ED 7/28  with worsening shortness of breath and cough.    PT Comments    Pt OOB in recliner on 1 lt nasal at 97%.  "I just had to get out of that bed".  Assisted with amb in hallway.  General Gait Details: slow pace, mildly unsteady initially. improved with distance.  Tolerated an increased distance and remained on 1 lt O2 avg sats 97%   "I have to slow my pace" due to fatigue and dyspnea.  Mod coughing after.    Follow Up Recommendations  Home health PT;Supervision - Intermittent (lives with daughter)     Equipment Recommendations  None recommended by PT    Recommendations for Other Services       Precautions / Restrictions Precautions Precautions: Fall Precaution Comments: monitor O2 Restrictions Weight Bearing Restrictions: No    Mobility  Bed Mobility               General bed mobility comments: OOB in recliner  Transfers Overall transfer level: Needs assistance Equipment used: Rolling walker (2 wheeled) Transfers: Sit to/from [facility] Sit to Stand: Supervision Stand pivot transfers: Supervision;Min guard       General transfer comment: 25% VC's on proper hand placement and safety with turns  Ambulation/Gait Ambulation/Gait assistance: Min guard Gait Distance (Feet): 55 Feet Assistive device: Rolling walker (2 wheeled) Gait Pattern/deviations: Step-through pattern Gait velocity: decreased   General Gait Details: slow pace, mildly unsteady initially. improved with distance.  Tolerated an increased distance and remained on 1 lt O2 avg sats 97%   Stairs             Wheelchair Mobility    Modified Rankin (Stroke Patients Only)       Balance                                             Cognition Arousal/Alertness: Awake/alert Behavior During Therapy: WFL for tasks assessed/performed Overall Cognitive Status: Within Functional Limits for tasks assessed                                 General Comments: AxO x 3 very pleasant      Exercises      General Comments        Pertinent Vitals/Pain Pain Assessment: Faces Faces Pain Scale: Hurts a little bit Pain Location: back right side from coughing Pain Descriptors / Indicators: Guarding Pain Intervention(s): Monitored during session    Home Living                      Prior Function            PT Goals (current goals can now be found in the care plan section) Progress towards PT goals: Progressing toward goals    Frequency    Min 3X/week      PT Plan Current plan remains appropriate    Co-evaluation              AM-PAC PT "6 Clicks" Mobility   Outcome Measure  Help needed turning from your back  to your side while in a flat bed without using bedrails?: None Help needed moving from lying on your back to sitting on the side of a flat bed without using bedrails?: None Help needed moving to and from a bed to a chair (including a wheelchair)?: A Little Help needed standing up from a chair using your arms (e.g., wheelchair or bedside chair)?: A Little Help needed to walk in hospital room?: A Little Help needed climbing 3-5 steps with a railing? : A Little 6 Click Score: 20    End of Session Equipment Utilized During Treatment: Gait belt;Oxygen Activity Tolerance: Patient tolerated treatment well Patient left: in chair;with call bell/phone within reach;with chair alarm set Nurse Communication: Mobility status PT Visit Diagnosis: Unsteadiness on feet (R26.81);Pain     Time: 2620-3559 PT Time Calculation (min) (ACUTE ONLY): 26 min  Charges:  $Gait Training: 8-22 mins $Therapeutic Activity: 8-22 mins                      Rica Koyanagi  PTA Acute  Rehabilitation Services Pager      [phone] Office      [phone]

## 2019-09-23 NOTE — Consult Note (Signed)
Cardiology Consultation:   Patient ID: [name] MRN: [id number]; DOB: 1934-11-29  Admit date: 09/18/2019 Date of Consult: 09/23/2019  Primary Care Provider: [name], MD [facility] Cardiologist: Sherren Mocha, MD  [facility] Electrophysiologist:  None    Patient Profile:   [name] is a 85 y.o. female with a hx of CAD, pulmonary sarcoidosis, HTN, HLD, SVT, bradycardia with surgery and carotid stenosis admitted 09/18/19 with community acquired PNA  who is being seen today for the evaluation of atrial fib at the request of Dr. Posey Pronto.  History of Present Illness:   [name] with above hx and was last seen in office 05/2019.   Hx of Cardiac cath 04/2015 with non obstructive CAD with 70% LCX disease though not hemodynamically significant by FFR, last nuc 07/2018 with    EF: 73%, sig extracardiac uptake at rest, prob low risk.  Carotid ultrasound bilat ICA 1-39>> FUprn.  Echo 07/19/19 (done with hospitalization for sepsis due to UTI and acute dyspnea) with EF 55-60%, no RWMA, mild LVH G1DD, there is mildly elevated pulmonary artery systolic pressure. The estimated right ventricular systolic pressure is 38.1 mmHg. Trivial MR.    Pt admitted 09/18/19 with persistent cough and SOB. Dx of community acquired PNA with hx of bronchiectasis/sarcoidosis.  Placed on ABX and duo nebs.  CTA of chest without PE + bilateral opacity    EKG:  The EKG was personally reviewed and demonstrates:  09/18/19 SR at 20 with LVH and na acute ST changes.   Today with A fib with HR 115 and LVH.   Telemetry:  Telemetry was personally reviewed and demonstrates: A fib the conversion to junctional , to SR  Troponin 10 Na 134, K+ 4.8, glucose 172, BUN 29, Cr 0.68  WBC 17.3 down from 20 but has been on steroids prior to admit  Hgb 13.4 plts 253  TSH 0.787 Mg+ 2.2 on admit   Currently BP 123.56 P 64  Afebrile. No chest pain or SOB. She was asleep when she first went into a fib but did not waken  until RN woke her from sleep.  She was then aware of fluttering in her chest.   She was in and out for a time but now SR with PACs.  Discussed a trial fib and need for anticoagulation for now, CHA2DS2VASc of 5.   Past Medical History:  Diagnosis Date  . Allergy    SEASONAL  . Anemia   . Arthritis   . Asthma    "slight"   . Baker's cyst of knee, right   . CAD (coronary artery disease)    a. Myoview 10/15 - normal EF 70% // Myoview 11/16: EF 75%, normal perfusion, (there was no blood pressure drop at low level exercise with Lexiscan infusion), low risk study // c. LHC 3/17 - LAD irregs, oLCx 70 (neg FFR), mRCA 30, EF 55-65% >> med Rx // Myoview 07/2018:  EF 73, extracardiac uptake, no significant ischemia (reviewed with Dr. Burt Knack), Low Risk   . Carotid stenosis    a. Carotid US 9/15 - Bilateral 1-39% ICA >> FU 2 years // b. Bilateral ICA 1-39 >> FU prn // Carotid US 06/2018: bilat 1-39; fu prn  . Dyspnea    due to Sarcoidosis. When is windy or humed  . Dysrhythmia    fast heart rate  . Elbow fracture 04/2017   Right, had surgery  . Esophageal reflux   . Hematochezia   . History of echocardiogram  a. Echo 10/15 - EF 60-65%, no RWMA  . HLD (hyperlipidemia)   . Hx of colonoscopy   . Osteoporosis   . Other chronic pulmonary heart diseases   . Paroxysmal supraventricular tachycardia ([place])   . Pneumonia   . Sarcoidosis     Past Surgical History:  Procedure Laterality Date  . arm surgery Right   . BLADDER SURGERY    . CARDIAC CATHETERIZATION N/A 05/20/2015   Procedure: Left Heart Cath and Coronary Angiography;  Surgeon: Sherren Mocha, MD;  Location: [facility];  Service: Cardiovascular;  Laterality: N/A;  . CARPAL TUNNEL RELEASE Left   . COLONOSCOPY    . CYSTOSCOPY W/ RETROGRADES Left 07/30/2019   Procedure: CYSTOSCOPY WITH RETROGRADE PYELOGRAM LEFT STENT PLACEMENT;  Surgeon: Ardis Hughs, MD;  Location: [facility];  Service: Urology;  Laterality: Left;  . CYSTOSCOPY WITH  RETROGRADE PYELOGRAM, URETEROSCOPY AND STENT PLACEMENT Left 08/20/2019   Procedure: CYSTOSCOPY, URETEROSCOPY AND STENT PLACEMENT;  Surgeon: Robley Fries, MD;  Location: [facility];  Service: Urology;  Laterality: Left;  1 HR  . FOOT SURGERY Right   . HIP SURGERY Right 2011   full replacement  . HOLMIUM LASER APPLICATION Left 2/63/3354   Procedure: HOLMIUM LASER APPLICATION;  Surgeon: Robley Fries, MD;  Location: [facility];  Service: Urology;  Laterality: Left;  . LEG SURGERY Left May 2014   femur fracture s/p open and closed reduction in [place], Dr. Jimmye Norman  . ORIF ELBOW FRACTURE Right 05/09/2017   Procedure: ORIF right olecranon fracture with repair/reconstruction, ulnar nerve transposition as needed;  Surgeon: Roseanne Kaufman, MD;  Location: [facility];  Service: Orthopedics;  Laterality: Right;  Requests 90 mins  . pelvis fracture    . POLYPECTOMY    . REVERSE SHOULDER ARTHROPLASTY Left 06/13/2019   Procedure: REVERSE SHOULDER ARTHROPLASTY;  Surgeon: Justice Britain, MD;  Location: [facility];  Service: Orthopedics;  Laterality: Left;  143min  . SHOULDER ARTHROSCOPY W/ ROTATOR CUFF REPAIR Right   . TRAPEZIUM RESECTION Right      Home Medications:  Prior to Admission medications   Medication Sig Start Date End Date Taking? Authorizing Provider  acetaminophen (TYLENOL) 500 MG tablet Take 1,000 mg by mouth every 6 (six) hours as needed for mild pain.    Yes [provider]  albuterol (VENTOLIN HFA) 108 (90 Base) MCG/ACT inhaler Inhale 2 puffs into the lungs every 4 (four) hours as needed for wheezing or shortness of breath. 01/07/19  Yes Martyn Ehrich, NP  aspirin EC 81 MG tablet Take 1 tablet (81 mg total) by mouth daily. 12/23/14  Yes Weaver, [name] T, PA-C  calcium carbonate (TUMS - DOSED IN MG ELEMENTAL CALCIUM) 500 MG chewable tablet Chew 1 tablet by mouth daily as needed for indigestion.    Yes [provider]  cefdinir (OMNICEF) 300 MG capsule Take 300 mg by mouth 2 (two)  times daily. 09/11/19  Yes [provider]  cetirizine (ZYRTEC) 10 MG tablet Take 10 mg by mouth daily.   Yes [provider]  denosumab (PROLIA) 60 MG/ML SOLN injection Inject 60 mg into the skin every 6 (six) months. Administer in upper arm, thigh, or abdomen   Yes [provider]  dextromethorphan (DELSYM) 30 MG/5ML liquid Take 30 mg by mouth 2 (two) times daily as needed for cough.   Yes [provider]  diltiazem (CARDIZEM CD) 180 MG 24 hr capsule Take 180 mg by mouth at bedtime.  05/08/19  Yes [provider]  docusate sodium (COLACE) 50 MG capsule Take 100 mg by mouth at bedtime.    Yes [provider]  fluticasone (FLONASE) 50 MCG/ACT nasal spray Place 2 sprays into both nostrils 2 (two) times daily. 05/31/19  Yes Lauraine Rinne, NP  guaiFENesin (MUCINEX) 600 MG 12 hr tablet Take 600 mg by mouth 2 (two) times daily.   Yes [provider]  ipratropium (ATROVENT HFA) 17 MCG/ACT inhaler Inhale 1 puff into the lungs 2 (two) times daily. 07/24/19 07/23/20 Yes Regalado, Belkys A, MD  melatonin 5 MG TABS Take 10 mg by mouth at bedtime.    Yes [provider]  mometasone-formoterol (DULERA) 200-5 MCG/ACT AERO Inhale 2 puffs into the lungs 2 (two) times daily. 07/24/19  Yes Regalado, Belkys A, MD  omeprazole (PRILOSEC) 40 MG capsule Take 40 mg by mouth 2 (two) times daily.  11/03/13  Yes [provider]  ondansetron (ZOFRAN-ODT) 4 MG disintegrating tablet Take 4 mg by mouth every 4 (four) hours as needed for nausea or vomiting.  09/16/19  Yes [provider]  oxyCODONE-acetaminophen (PERCOCET) 5-325 MG tablet Take 1 tablet by mouth every 4 (four) hours as needed (max 6 q for post op pain). 06/14/19  Yes Shuford, Olivia Mackie, PA-C  polyethylene glycol (MIRALAX / GLYCOLAX) 17 g packet Take 17 g by mouth daily.   Yes [provider]  predniSONE (DELTASONE) 20 MG tablet Take 20 mg by mouth every morning. 09/16/19  Yes [provider]  rosuvastatin (CRESTOR) 10 MG tablet Take 10 mg by mouth every Monday, Wednesday, and Friday.    Yes [provider]  Vitamin D, Ergocalciferol, (DRISDOL) 50000 units CAPS capsule Take 50,000 Units by mouth every Friday.    Yes [provider]  benzonatate (TESSALON) 100 MG capsule Take 1 capsule (100 mg total) by mouth every 6 (six) hours as needed for cough. 09/18/19   Collene Gobble, MD  doxycycline (MONODOX) 100 MG capsule Take 100 mg by mouth 2 (two) times daily. Patient not taking: Reported on 09/18/2019 09/07/19   [provider]  furosemide (LASIX) 20 MG tablet Take 1 tablet (20 mg total) by mouth daily. Patient not taking: Reported on 09/18/2019 07/24/19   Regalado, Jerald Kief A, MD  ipratropium (ATROVENT) 0.03 % nasal spray Place 2 sprays into both nostrils 4 (four) times daily as needed for rhinitis. Patient not taking: Reported on 09/18/2019 05/31/19   Lauraine Rinne, NP  nitroGLYCERIN (NITROSTAT) 0.4 MG SL tablet Place 1 tablet (0.4 mg total) under the tongue every 5 (five) minutes as needed for chest pain. 07/11/17   [name] T, PA-C  ondansetron (ZOFRAN) 4 MG tablet Take 1 tablet (4 mg total) by mouth every 8 (eight) hours as needed for nausea or vomiting (take along with pain med). Patient not taking: Reported on 09/18/2019 06/14/19   Shuford, Olivia Mackie, PA-C  potassium chloride (KLOR-CON) 10 MEQ tablet Take 1 tablet (10 mEq total) by mouth daily. Patient not taking: Reported on 09/18/2019 07/24/19   Regalado, Jerald Kief A, MD  predniSONE (STERAPRED UNI-PAK 21 TAB) 10 MG (21) TBPK tablet Take by mouth as directed. Patient not taking: Reported on 09/18/2019 09/11/19   [provider]  saccharomyces boulardii (FLORASTOR) 250 MG capsule Take 1 capsule (250 mg total) by mouth 2 (two) times daily. Patient not taking: Reported on 09/18/2019 07/24/19   Elmarie Shiley, MD    Inpatient Medications: Scheduled Meds: . amoxicillin-clavulanate  1 tablet Oral Q12H   .  aspirin EC  81 mg Oral Daily  . benzonatate  100 mg Oral TID  . chlorpheniramine-HYDROcodone  5 mL Oral Q12H  . diltiazem  180 mg Oral QHS  . docusate sodium  100 mg Oral QHS  . feeding supplement (ENSURE ENLIVE)  237 mL Oral BID BM  . fluticasone  2 spray Each Nare BID  . guaiFENesin  600 mg Oral BID  . insulin aspart  0-9 Units Subcutaneous TID WC  . ipratropium-albuterol  3 mL Nebulization BID  . loratadine  10 mg Oral Daily  . melatonin  10 mg Oral QHS  . methylPREDNISolone (SOLU-MEDROL) injection  40 mg Intravenous Q12H  . mometasone-formoterol  2 puff Inhalation BID  . multivitamin with minerals  1 tablet Oral Daily  . nystatin  5 mL Oral QID  . pantoprazole  40 mg Oral Daily  . polyethylene glycol  17 g Oral Daily  . rosuvastatin  10 mg Oral Q M,W,F  . sodium chloride HYPERTONIC  4 mL Nebulization Daily   Continuous Infusions: . heparin 750 Units/hr (09/23/19 0607)   PRN Meds: acetaminophen, albuterol, alum & mag hydroxide-simeth, ondansetron (ZOFRAN) IV, traMADol  Allergies:    Allergies  Allergen Reactions  . Levaquin [Levofloxacin] Other (See Comments)    Leg pain .Marland Kitchen Per doctor not to take again  . Sulfonamide Derivatives     Pt unsure  . Oxycodone Nausea Only    Can take oxycodone with Zofran  . Morphine Nausea Only    Social History:   Social History   Socioeconomic History  . Marital status: Widowed    Spouse name: Not on file  . Number of children: 1  . Years of education: Not on file  . Highest education level: Some college, no degree  Occupational History  . Occupation: retired    Comment: still works as Scientist, 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  Tobacco Use  . Smoking status: Never Smoker  . Smokeless tobacco: Never Used  Vaping Use  . Vaping Use: Never used  Substance and Sexual Activity  . Alcohol use: No  . Drug use: No  . Sexual activity: Not Currently    Partners: Male    Birth control/protection: Post-menopausal  Other Topics  Concern  . Not on file  Social History Narrative   Lives with daughter and son in law   decaffeinated  use:  Coffee 1 per day   Right handed   Social Determinants of Health   Financial Resource Strain:   . Difficulty of Paying Living Expenses:   Food Insecurity:   . Worried About Charity fundraiser in the Last Year:   . Arboriculturist in the Last Year:   Transportation Needs:   . Film/video editor (Medical):   Marland Kitchen Lack of Transportation (Non-Medical):   Physical Activity:   . Days of Exercise per Week:   . Minutes of Exercise per Session:   Stress:   . Feeling of Stress :   Social Connections:   . Frequency of Communication with Friends and Family:   . Frequency of Social Gatherings with Friends and Family:   . Attends Religious Services:   . Active Member of Clubs or Organizations:   . Attends Archivist Meetings:   Marland Kitchen Marital Status:   Intimate Partner Violence:   . Fear of Current or Ex-Partner:   . Emotionally Abused:   Marland Kitchen Physically Abused:   . Sexually Abused:     Family History:  Family History  Problem Relation Age of Onset  . Colon cancer Mother 51  . Cancer Mother   . Lung cancer Father   . Heart disease Father   . Arrhythmia Father   . Cancer Father   . Cancer Sister        ovarian  . Heart attack Brother   . Esophageal cancer Neg Hx   . Rectal cancer Neg Hx   . Stomach cancer Neg Hx   . Stroke Neg Hx      ROS:  Please see the history of present illness.  General:+ colds no fevers, no weight changes Skin:no rashes or ulcers HEENT:no blurred vision, no congestion CV:see HPI PUL:see HPI GI:no diarrhea constipation or melena, no indigestion GU:no hematuria, no dysuria MS:no joint pain, no claudication Neuro:no syncope, no lightheadedness Endo:no diabetes, no thyroid disease  All other ROS reviewed and negative.     Physical Exam/Data:   Vitals:   09/23/19 0238 09/23/19 0336 09/23/19 0527 09/23/19 0815  BP: (!) 135/66 (!)  121/63 (!) 123/56   Pulse: 80 81 64   Resp: 15 16 14    Temp: 97.6 F (36.4 C) 97.6 F (36.4 C) 97.6 F (36.4 C)   TempSrc: Oral Oral Oral   SpO2: 97% 96% 100% 99%  Weight:      Height:        Intake/Output Summary (Last 24 hours) at 09/23/2019 0835 Last data filed at 09/23/2019 0528 Gross per 24 hour  Intake 1410 ml  Output 1500 ml  Net -90 ml   Last 3 Weights 09/18/2019 08/20/2019 08/15/2019  Weight (lbs) 117 lb 118 lb 9.6 oz 118 lb 9.6 oz  Weight (kg) 53.071 kg 53.797 kg 53.797 kg     Body mass index is 20.73 kg/m.  General:  Well nourished, well developed, in no acute distress HEENT: normal Lymph: no adenopathy Neck: no JVD Endocrine:  No thryomegaly Vascular: No carotid bruits; FA pulses 2+ bilaterally without bruits  Cardiac:  normal S1, S2; RRR; no murmur gallup rub or click Lungs:  Rhonchi throughtout to auscultation bilaterally, some wheezing, no rales  Abd: soft, nontender, no hepatomegaly  Ext: no edema Musculoskeletal:  No deformities, BUE and BLE strength normal and equal Skin: warm and dry  Neuro:  Alert and oriented X 3 MAE, no focal abnormalities noted Psych:  Normal affect   Relevant CV Studies: Echo 07/19/19 IMPRESSIONS    1. Left ventricular ejection fraction, by estimation, is 55 to 60%. The  left ventricle has normal function. The left ventricle has no regional  wall motion abnormalities. There is mild left ventricular hypertrophy.  Left ventricular diastolic parameters  are consistent with Grade I diastolic dysfunction (impaired relaxation).  2. Right ventricular systolic function is hyperdynamic. The right  ventricular size is normal. There is mildly elevated pulmonary artery  systolic pressure. The estimated right ventricular systolic pressure is  81.0 mmHg.  3. The mitral valve is grossly normal. Trivial mitral valve  regurgitation.  4. The aortic valve is tricuspid. Aortic valve regurgitation is not  visualized. Mild aortic valve  sclerosis is present, with no evidence of  aortic valve stenosis.  5. The inferior vena cava is dilated in size with >50% respiratory  variability, suggesting right atrial pressure of 8 mmHg.   Comparison(s): Changes from prior study are noted. 12/04/13: LVEF 60-65%,  RVSP 30 mmHg.   FINDINGS  Left Ventricle: Left ventricular ejection fraction, by estimation, is 55  to 60%. The left ventricle has normal  function. The left ventricle has no  regional wall motion abnormalities. The left ventricular internal cavity  size was normal in size. There is  mild left ventricular hypertrophy. Indeterminate filling pressures.   Right Ventricle: The right ventricular size is normal. No increase in  right ventricular wall thickness. Right ventricular systolic function is  hyperdynamic. There is mildly elevated pulmonary artery systolic pressure.  The tricuspid regurgitant velocity  is 2.69 m/s, and with an assumed right atrial pressure of 8 mmHg, the  estimated right ventricular systolic pressure is 97.9 mmHg.   Pericardium: Trivial pericardial effusion is present. The pericardial  effusion is posterior to the left ventricle.   Mitral Valve: The mitral valve is grossly normal. Trivial mitral valve  regurgitation.   Aortic Valve: The aortic valve is tricuspid. Aortic valve regurgitation is  not visualized. Mild aortic valve sclerosis is present, with no evidence  of aortic valve stenosis.   Pulmonic Valve: The pulmonic valve was normal in structure. Pulmonic valve  regurgitation is not visualized.   Aorta: The aortic root and ascending aorta are structurally normal, with  no evidence of dilitation.   Venous: The inferior vena cava is dilated in size with greater than 50%  respiratory variability, suggesting right atrial pressure of 8 mmHg.   Laboratory Data:  High Sensitivity Troponin:   Recent Labs  Lab 09/23/19 0439  TROPONINIHS 10     Chemistry Recent Labs  Lab 09/21/19 0401  09/22/19 0427 09/23/19 0439  NA 137 134* 134*  K 4.2 4.5 4.8  CL 102 101 99  CO2 22 26 26   GLUCOSE 190* 112* 172*  BUN 16 26* 29*  CREATININE 0.53 0.61 0.68  CALCIUM 8.7* 8.7* 8.6*  GFRNONAA >60 >60 >60  GFRAA >60 >60 >60  ANIONGAP 13 7 9     Recent Labs  Lab 09/18/19 1650 09/19/19 1340  PROT 6.1* 6.6  ALBUMIN 3.4* 3.5  AST 15 15  ALT 18 17  ALKPHOS 90 90  BILITOT 0.7 0.5   Hematology Recent Labs  Lab 09/21/19 0401 09/22/19 0427 09/23/19 0439  WBC 17.4* 20.1* 17.3*  RBC 3.85* 4.25 4.28  HGB 12.4 13.3 13.4  HCT 37.9 41.4 41.9  MCV 98.4 97.4 97.9  MCH 32.2 31.3 31.3  MCHC 32.7 32.1 32.0  RDW 16.1* 16.1* 16.1*  PLT 242 298 253   BNPNo results for input(s): BNP, PROBNP in the last 168 hours.  DDimer No results for input(s): DDIMER in the last 168 hours.   Radiology/Studies:  No results found.      NO CHEST PAIN     Assessment and Plan:   1. Atrial fib with RVR in combination with PNA.  Given IV dilt and IV heparin. Now IV dilt off and on po dilt at 180 - has conversion junctional rhythm briefly - hx of bradycardia in hospital previously may be difficult to increase dilt.  Though she continues with PACs.   2. anticoagulation on IV heparin. With CHA2DS2VASC of 5,  eliquis but will defer to Dr. Radford Pax.   3. PNA and pulmonary sarcoid followed by IM 4. CAD with last cath 2017 and neg nuc in 2020.  No angina.  Troponin 10.   5. Hx SVT on po dilt.       For questions or updates, please contact [place] Please consult [email or web address] for contact info under    Signed, Cecilie Kicks, NP  09/23/2019 8:35 AM

## 2019-09-23 NOTE — Progress Notes (Signed)
Echocardiogram 2D Echocardiogram has been performed.  Oneal Deputy Maci Eickholt 09/23/2019, 1:58 PM

## 2019-09-23 NOTE — Progress Notes (Signed)
Will for IV heparin Indication: atrial fibrillation  Allergies  Allergen Reactions  . Levaquin [Levofloxacin] Other (See Comments)    Leg pain .Marland Kitchen Per doctor not to take again  . Sulfonamide Derivatives     Pt unsure  . Oxycodone Nausea Only    Can take oxycodone with Zofran  . Morphine Nausea Only   Patient Measurements: Height: 5\' 3"  (160 cm) Weight: 53.1 kg (117 lb) IBW/kg (Calculated) : 52.4 Heparin Dosing Weight:    Vital Signs: Temp: 99 F (37.2 C) (08/02 2004) Temp Source: Oral (08/02 1300) BP: 134/65 (08/02 2004) Pulse Rate: 70 (08/02 2004)  Labs: Recent Labs    09/21/19 0401 09/21/19 0401 09/22/19 0427 09/23/19 0439 09/23/19 1402 09/23/19 2240  HGB 12.4   < > 13.3 13.4  --   --   HCT 37.9  --  41.4 41.9  --   --   PLT 242  --  298 253  --   --   HEPARINUNFRC  --   --   --   --  0.69 0.47  CREATININE 0.53  --  0.61 0.68  --   --   TROPONINIHS  --   --   --  10  --   --    < > = values in this interval not displayed.   Estimated Creatinine Clearance: 44.1 mL/min (by C-G formula based on SCr of 0.68 mg/dL).  Medical History: Past Medical History:  Diagnosis Date  . Allergy    SEASONAL  . Anemia   . Arthritis   . Asthma    "slight"   . Baker's cyst of knee, right   . CAD (coronary artery disease)    a. Myoview 10/15 - normal EF 70% // Myoview 11/16: EF 75%, normal perfusion, (there was no blood pressure drop at low level exercise with Lexiscan infusion), low risk study // c. LHC 3/17 - LAD irregs, oLCx 70 (neg FFR), mRCA 30, EF 55-65% >> med Rx // Myoview 07/2018:  EF 73, extracardiac uptake, no significant ischemia (reviewed with Dr. Burt Knack), Low Risk   . Carotid stenosis    a. Carotid US 9/15 - Bilateral 1-39% ICA >> FU 2 years // b. Bilateral ICA 1-39 >> FU prn // Carotid US 06/2018: bilat 1-39; fu prn  . Dyspnea    due to Sarcoidosis. When is windy or humed  . Dysrhythmia    fast heart rate  . Elbow  fracture 04/2017   Right, had surgery  . Esophageal reflux   . Hematochezia   . History of echocardiogram    a. Echo 10/15 - EF 60-65%, no RWMA  . HLD (hyperlipidemia)   . Hx of colonoscopy   . Osteoporosis   . Other chronic pulmonary heart diseases   . Paroxysmal supraventricular tachycardia ([place])   . Pneumonia   . Sarcoidosis    Medications:  Scheduled:  . amoxicillin-clavulanate  1 tablet Oral Q12H  . aspirin EC  81 mg Oral Daily  . benzonatate  100 mg Oral TID  . chlorpheniramine-HYDROcodone  5 mL Oral Q12H  . diltiazem  180 mg Oral QHS  . docusate sodium  100 mg Oral QHS  . feeding supplement (ENSURE ENLIVE)  237 mL Oral BID BM  . fluticasone  2 spray Each Nare BID  . guaiFENesin  600 mg Oral BID  . insulin aspart  0-9 Units Subcutaneous TID WC  . ipratropium-albuterol  3 mL Nebulization BID  . loratadine  10 mg Oral Daily  . melatonin  10 mg Oral QHS  . methylPREDNISolone (SOLU-MEDROL) injection  40 mg Intravenous Q12H  . mometasone-formoterol  2 puff Inhalation BID  . multivitamin with minerals  1 tablet Oral Daily  . nystatin  5 mL Oral QID  . pantoprazole  40 mg Oral Daily  . polyethylene glycol  17 g Oral Daily  . rosuvastatin  10 mg Oral Q M,W,F  . sodium chloride HYPERTONIC  4 mL Nebulization Daily   Assessment: Pharmacy is consulted to dose heparin in 85 yo female diagnmosed with atrial fibrillation. Pt not on any blood thinners PTA. Pt previously on enoxaparin 40 mg daily. Last dose on 8/1 at 2126.   Today, 09/23/19  Hgb 13.4, plt 253, SCr wnl  Heparin level 0.47 units/ml on 650 units/hr  No bleeding or infusion related issues reported by nursing  Goal of Therapy:  Heparin level 0.3-0.7 units/ml Monitor platelets by anticoagulation protocol: Yes   Plan:    Continue heparin drip at 650 units/hr   Daily CBC & heparin level while on heparin  Monitor for signs and symptoms of bleeding  Netta Cedars, PharmD, BCPS 09/23/2019, 11:13 PM

## 2019-09-23 NOTE — Progress Notes (Signed)
Inpatient Diabetes Program Recommendations  AACE/ADA: New Consensus Statement on Inpatient Glycemic Control (2015)  Target Ranges:  Prepandial:   less than 140 mg/dL      Peak postprandial:   less than 180 mg/dL (1-2 hours)      Critically ill patients:  140 - 180 mg/dL   Lab Results  Component Value Date   GLUCAP 122 (H) 09/23/2019   HGBA1C 5.8 (H) 09/19/2019    Review of Glycemic Control Results for [name], [name] (MRN [id number]) as of 09/23/2019 11:25  Ref. Range 09/22/2019 07:37 09/22/2019 11:57 09/22/2019 16:49 09/22/2019 20:39 09/23/2019 07:45  Glucose-Capillary Latest Ref Range: 70 - 99 mg/dL 136 (H) 203 (H) 320 (H) 222 (H) 122 (H)   Diabetes history: None Current orders for Inpatient glycemic control:  Novolog sensitive tid with meals Solumedrol 40 mg IV q 12 hours  Inpatient Diabetes Program Recommendations:   Note that post-prandial blood sugars increased.  Please consider adding Novolog meal coverage 3 units tid with meals (hold if patient eats less than 50%) while on IV steroids.    Thanks  Adah Perl, RN, BC-ADM Inpatient Diabetes Coordinator Pager [phone] (8a-5p)

## 2019-09-23 NOTE — Progress Notes (Signed)
[place] for IV heparin Indication: atrial fibrillation  Allergies  Allergen Reactions  . Levaquin [Levofloxacin] Other (See Comments)    Leg pain .Marland Kitchen Per doctor not to take again  . Sulfonamide Derivatives     Pt unsure  . Oxycodone Nausea Only    Can take oxycodone with Zofran  . Morphine Nausea Only   Patient Measurements: Height: 5\' 3"  (160 cm) Weight: 53.1 kg (117 lb) IBW/kg (Calculated) : 52.4 Heparin Dosing Weight:    Vital Signs: Temp: 98 F (36.7 C) (08/02 1300) Temp Source: Oral (08/02 1300) BP: 141/65 (08/02 1300) Pulse Rate: 74 (08/02 1300)  Labs: Recent Labs    09/21/19 0401 09/21/19 0401 09/22/19 0427 09/23/19 0439 09/23/19 1402  HGB 12.4   < > 13.3 13.4  --   HCT 37.9  --  41.4 41.9  --   PLT 242  --  298 253  --   HEPARINUNFRC  --   --   --   --  0.69  CREATININE 0.53  --  0.61 0.68  --   TROPONINIHS  --   --   --  10  --    < > = values in this interval not displayed.   Estimated Creatinine Clearance: 44.1 mL/min (by C-G formula based on SCr of 0.68 mg/dL).  Medical History: Past Medical History:  Diagnosis Date  . Allergy    SEASONAL  . Anemia   . Arthritis   . Asthma    "slight"   . Baker's cyst of knee, right   . CAD (coronary artery disease)    a. Myoview 10/15 - normal EF 70% // Myoview 11/16: EF 75%, normal perfusion, (there was no blood pressure drop at low level exercise with Lexiscan infusion), low risk study // c. LHC 3/17 - LAD irregs, oLCx 70 (neg FFR), mRCA 30, EF 55-65% >> med Rx // Myoview 07/2018:  EF 73, extracardiac uptake, no significant ischemia (reviewed with Dr. Burt Knack), Low Risk   . Carotid stenosis    a. Carotid US 9/15 - Bilateral 1-39% ICA >> FU 2 years // b. Bilateral ICA 1-39 >> FU prn // Carotid US 06/2018: bilat 1-39; fu prn  . Dyspnea    due to Sarcoidosis. When is windy or humed  . Dysrhythmia    fast heart rate  . Elbow fracture 04/2017   Right, had surgery  .  Esophageal reflux   . Hematochezia   . History of echocardiogram    a. Echo 10/15 - EF 60-65%, no RWMA  . HLD (hyperlipidemia)   . Hx of colonoscopy   . Osteoporosis   . Other chronic pulmonary heart diseases   . Paroxysmal supraventricular tachycardia ([place])   . Pneumonia   . Sarcoidosis    Medications:  Scheduled:  . amoxicillin-clavulanate  1 tablet Oral Q12H  . aspirin EC  81 mg Oral Daily  . benzonatate  100 mg Oral TID  . chlorpheniramine-HYDROcodone  5 mL Oral Q12H  . diltiazem  180 mg Oral QHS  . docusate sodium  100 mg Oral QHS  . feeding supplement (ENSURE ENLIVE)  237 mL Oral BID BM  . fluticasone  2 spray Each Nare BID  . guaiFENesin  600 mg Oral BID  . insulin aspart  0-9 Units Subcutaneous TID WC  . ipratropium-albuterol  3 mL Nebulization BID  . loratadine  10 mg Oral Daily  . melatonin  10 mg Oral QHS  . methylPREDNISolone (SOLU-MEDROL) injection  40 mg Intravenous Q12H  . mometasone-formoterol  2 puff Inhalation BID  . multivitamin with minerals  1 tablet Oral Daily  . nystatin  5 mL Oral QID  . pantoprazole  40 mg Oral Daily  . polyethylene glycol  17 g Oral Daily  . rosuvastatin  10 mg Oral Q M,W,F  . sodium chloride HYPERTONIC  4 mL Nebulization Daily   Assessment: Pharmacy is consulted to dose heparin in 85 yo female diagnmosed with atrial fibrillation. Pt not on any blood thinners PTA. Pt previously on enoxaparin 40 mg daily. Last dose on 8/1 at 2126.   Today, 09/23/19  Hgb 13.4, plt 253, SCr wnl  First Heparin level 0.69 units/ml on 750 units/hr, no load  Goal of Therapy:  Heparin level 0.3-0.7 units/ml Monitor platelets by anticoagulation protocol: Yes   Plan:    Decrease heparin drip to 650 units/hr  Obtain HL 8 hours after dose reduction   Daily CBC while on heparin, order daily Hep level at steady state   Monitor for signs and symptoms of bleeding  Minda Ditto PharmD 09/23/2019, 2:46 PM

## 2019-09-23 NOTE — Significant Event (Signed)
The nurse notified that the patient went into A. fib with RVR blood pressure was 130/80 patient was asymptomatic heart rate was in the 130s.  On exam at bedside patient is not in distress denies any chest pain or shortness of breath.  Cardizem 10 mg IV bolus was given following which heart rate improved to 90s but still in A. fib.  Reviewed patient's history in chart and also with the patient.  No recent major bleedings.  Started on heparin for now check TSH and troponin and 2D echo.  Cardiology consult requested.  Gean Birchwood

## 2019-09-23 NOTE — TOC Initial Note (Signed)
Transition of Care [facility]) - Initial/Assessment Note    Patient Details  Name: [name] MRN: [id number] Date of Birth: [birth year]  Transition of Care [facility]) CM/SW Contact:    Sherri Mage, LCSW Phone Number: 09/23/2019, 11:30 AM  Clinical Narrative:   Patient seen in follow up to PT recommendation of HHPT.  [name] states she lives with her daughter and son-in-law, and has all needed equipment at home.  She is already seeing [name] for [facility] PT and is very happy with their services.  Plans to continue with them at d/c.  Will need resumption orders at d/c. TOC will continue to follow during the course of hospitalization.                 Expected Discharge Plan: [place] Barriers to Discharge: No Barriers Identified   Patient Goals and CMS Choice Patient states their goals for this hospitalization and ongoing recovery are:: "I want to work with [name] again."      Expected Discharge Plan and Services Expected Discharge Plan: [place] In-house Referral: Clinical Social Work     Living arrangements for the past 2 months: [place]: [place] Date [place]: 09/23/19 Time HH Agency Contacted: [id number] Representative spoke with at [place]: [name]  Prior Living Arrangements/Services Living arrangements for the past 2 months: [place] Lives with:: Adult Children Patient language and need for interpreter reviewed:: Yes Do you feel safe going back to the place where you live?: Yes      Need for Family Participation in Patient Care: Yes (Comment) Care giver support system in place?: Yes (comment) Current home services: DME, Home PT Criminal Activity/Legal Involvement Pertinent to Current Situation/Hospitalization: No - Comment as needed  Activities of Daily Living Home Assistive Devices/Equipment: Eyeglasses (reading glasses) ADL Screening (condition at time  of admission) Patient's cognitive ability adequate to safely complete daily activities?: Yes Is the patient deaf or have difficulty hearing?: Yes (normally is good, fluid in both ears and having trouble hearing) Does the patient have difficulty seeing, even when wearing glasses/contacts?: No Does the patient have difficulty concentrating, remembering, or making decisions?: No Patient able to express need for assistance with ADLs?: Yes Does the patient have difficulty dressing or bathing?: Yes Independently performs ADLs?: No Communication: Independent Dressing (OT): Needs assistance Is this a change from baseline?: Change from baseline, expected to last <3days Grooming: Independent Feeding: Needs assistance Is this a change from baseline?: Change from baseline, expected to last <3 days Bathing: Needs assistance Is this a change from baseline?: Change from baseline, expected to last <3 days Toileting: Needs assistance Is this a change from baseline?: Change from baseline, expected to last <3 days In/Out Bed: Needs assistance Is this a change from baseline?: Change from baseline, expected to last <3 days Walks in Home: Needs assistance Is this a change from baseline?: Change from baseline, expected to last <3 days Does the patient have difficulty walking or climbing stairs?: Yes Weakness of Legs: Both Weakness of Arms/Hands: Both  Permission Sought/Granted                  Emotional Assessment Appearance:: Appears stated age Attitude/Demeanor/Rapport: Engaged Affect (typically observed): Appropriate Orientation: : Oriented to Self, Oriented to Place, Oriented  to  Time, Oriented to Situation Alcohol / Substance Use: Not Applicable Psych Involvement: No (comment)  Admission diagnosis:  Pneumonia [J18.9] Community acquired pneumonia of right lung, unspecified part of lung [J18.9] Aspiration pneumonia ([place]) [J69.0] Patient Active Problem List   Diagnosis Date Noted  .  Aspiration pneumonia ([place]) 09/19/2019  . Pneumonia 09/18/2019  . Sepsis ([place]) 07/30/2019  . Pyelonephritis 07/29/2019  . Hydronephrosis 07/29/2019  . Sepsis due to urinary tract infection ([place]) 07/18/2019  . S/P reverse total shoulder arthroplasty, 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  . Pre-op evaluation 06/11/2019  . Low back pain 03/13/2019  . Fracture of inferior pubic ramus ([place]) 02/28/2019  . Degeneration of lumbar intervertebral disc 02/14/2019  . Sacral insufficiency fracture 12/12/2018  . Hip fracture ([place]) 12/12/2018  . ETD (Eustachian tube dysfunction), bilateral 11/07/2017  . Chronic nonintractable headache 11/07/2017  . Aftercare 06/06/2017  . Closed fracture of right olecranon process 05/09/2017  . Pain in joint of right elbow 05/02/2017  . Rib lesion 04/20/2017  . Bronchiectasis without complication ([place]) 81/15/7262  . Allergic rhinitis 09/15/2015  . CAD (coronary artery disease) 05/18/2015  . Essential hypertension 05/18/2015  . Carotid artery disease ([place]) 05/18/2015  . Cough 03/19/2015  . Left foot pain 09/02/2014  . DOE (dyspnea on exertion) 12/26/2013  . Abnormal gait 11/06/2013  . Scoliosis (and kyphoscoliosis), idiopathic 07/30/2013  . 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 Constipation 09/05/2012  . History of sinus tachycardia 09/05/2012  . Closed left subtrochanteric femur fracture S/P Open/closed and reduction, internal medullary fixation  09/05/2012  . Acute posthemorrhagic anemia 08/22/2012  . Lumbar radiculopathy 11/23/2011  . Leg weakness 11/23/2011  . Mitral regurgitation 11/22/2011  . DIARRHEA-PRESUMED INFECTIOUS 08/18/2008  . RECTAL BLEEDING 08/18/2008  . PERSONAL HX COLONIC POLYPS 08/18/2008  . DEGENERATIVE JOINT DISEASE 08/14/2008  . SKIN CANCER, HX OF 08/14/2008  . CARPAL TUNNEL SYNDROME, HX OF 08/14/2008  . SARCOIDOSIS, PULMONARY 08/08/2008  . HLD (hyperlipidemia) 08/08/2008  . Paroxysmal supraventricular tachycardia ([place]) 08/08/2008  . GERD  08/08/2008   PCP:  [name], MD Pharmacy:   [facility] [place], [address] [place] [address] Phone: [phone] Fax: [facility] [place], [address] [place] [address] Phone: [phone] Fax: [phone]     Social Determinants of Health (SDOH) Interventions    Readmission Risk Interventions Readmission Risk Prevention Plan 08/01/2019  Transportation Screening Complete  PCP or Specialist Appt within 5-7 Days Complete  Home Care Screening Complete  Medication Review (RN CM) Referral to Pharmacy  Some recent data might be hidden

## 2019-09-24 DIAGNOSIS — I480 Paroxysmal atrial fibrillation: Secondary | ICD-10-CM

## 2019-09-24 LAB — GLUCOSE, CAPILLARY
Glucose-Capillary: 222 mg/dL — ABNORMAL HIGH (ref 70–99)
Glucose-Capillary: 222 mg/dL — ABNORMAL HIGH (ref 70–99)
Glucose-Capillary: 244 mg/dL — ABNORMAL HIGH (ref 70–99)
Glucose-Capillary: 175 mg/dL — ABNORMAL HIGH (ref 70–99)
Glucose-Capillary: 146 mg/dL — ABNORMAL HIGH (ref 70–99)

## 2019-09-24 LAB — BASIC METABOLIC PANEL
Anion gap: 7 (ref 5–15)
BUN: 27 mg/dL — ABNORMAL HIGH (ref 8–23)
CO2: 26 mmol/L (ref 22–32)
Calcium: 8.4 mg/dL — ABNORMAL LOW (ref 8.9–10.3)
Chloride: 100 mmol/L (ref 98–111)
Creatinine, Ser: 0.61 mg/dL (ref 0.44–1.00)
GFR calc Af Amer: >60 mL/min (ref >60)
GFR calc non Af Amer: >60 mL/min (ref >60)
Glucose, Bld: 178 mg/dL — ABNORMAL HIGH (ref 70–99)
Potassium: 5.1 mmol/L (ref 3.5–5.1)
Sodium: 133 mmol/L — ABNORMAL LOW (ref 135–145)

## 2019-09-24 LAB — CBC
HCT: 39.7 % (ref 36.0–46.0)
Hemoglobin: 12.5 g/dL (ref 12.0–15.0)
MCH: 31.6 pg (ref 26.0–34.0)
MCHC: 31.5 g/dL (ref 30.0–36.0)
MCV: 100.3 fL — ABNORMAL HIGH (ref 80.0–100.0)
Platelets: 271 10*3/uL (ref 150–400)
RBC: 3.96 MIL/uL (ref 3.87–5.11)
RDW: 16.1 % — ABNORMAL HIGH (ref 11.5–15.5)
WBC: 22.8 10*3/uL — ABNORMAL HIGH (ref 4.0–10.5)
nRBC: 0 % (ref 0.0–0.2)

## 2019-09-24 LAB — HEPARIN LEVEL (UNFRACTIONATED): Heparin Unfractionated: 0.39 IU/mL (ref 0.30–0.70)

## 2019-09-24 MED ORDER — DILTIAZEM HCL 25 MG/5ML IV SOLN
10.0000 mg | Freq: Once | INTRAVENOUS | Status: AC
  Administered 2019-09-24: 10 mg via INTRAVENOUS
  Filled 2019-09-24: qty 5

## 2019-09-24 MED ORDER — LACTULOSE 10 GM/15ML PO SOLN
20.0000 g | Freq: Every day | ORAL | Status: DC
  Administered 2019-09-24 – 2019-09-26 (×3): 20 g via ORAL
  Filled 2019-09-24 (×3): qty 30

## 2019-09-24 MED ORDER — DILTIAZEM HCL-DEXTROSE 125-5 MG/125ML-% IV SOLN (PREMIX)
5.0000 mg/h | INTRAVENOUS | Status: DC
  Filled 2019-09-24: qty 125

## 2019-09-24 MED ORDER — METOPROLOL TARTRATE 5 MG/5ML IV SOLN: 5.0000 mg | INTRAVENOUS | Status: DC | PRN

## 2019-09-24 MED ORDER — SENNOSIDES-DOCUSATE SODIUM 8.6-50 MG PO TABS
1.0000 | ORAL_TABLET | Freq: Two times a day (BID) | ORAL | Status: DC
  Administered 2019-09-24 – 2019-09-26 (×5): 1 via ORAL
  Filled 2019-09-24 (×5): qty 1

## 2019-09-24 MED ORDER — DILTIAZEM HCL ER COATED BEADS 240 MG PO CP24: 240.0000 mg | ORAL_CAPSULE | Freq: Every day | ORAL | Status: DC

## 2019-09-24 MED ORDER — DILTIAZEM HCL ER 90 MG PO CP12
180.0000 mg | ORAL_CAPSULE | Freq: Two times a day (BID) | ORAL | Status: DC
  Administered 2019-09-24 (×2): 180 mg via ORAL
  Filled 2019-09-24 (×3): qty 2

## 2019-09-24 MED ORDER — PREDNISONE 20 MG PO TABS
50.0000 mg | ORAL_TABLET | Freq: Every day | ORAL | Status: DC
  Administered 2019-09-24 – 2019-09-26 (×3): 50 mg via ORAL
  Filled 2019-09-24 (×3): qty 2

## 2019-09-24 MED ORDER — APIXABAN 2.5 MG PO TABS
2.5000 mg | ORAL_TABLET | Freq: Two times a day (BID) | ORAL | Status: DC
  Administered 2019-09-24 – 2019-09-26 (×5): 2.5 mg via ORAL
  Filled 2019-09-24 (×5): qty 1

## 2019-09-24 NOTE — Progress Notes (Signed)
[place] for IV heparin transitioned to Apixaban Indication: atrial fibrillation  Allergies  Allergen Reactions  . Levaquin [Levofloxacin] Other (See Comments)    Leg pain .Marland Kitchen Per doctor not to take again  . Sulfonamide Derivatives     Pt unsure  . Oxycodone Nausea Only    Can take oxycodone with Zofran  . Morphine Nausea Only   Patient Measurements: Height: 5\' 3"  (160 cm) Weight: 53.1 kg (117 lb) IBW/kg (Calculated) : 52.4 Heparin Dosing Weight:    Vital Signs: Temp: 98 F (36.7 C) (08/03 0433) BP: 131/60 (08/03 0433) Pulse Rate: 69 (08/03 0433)  Labs: Recent Labs    09/22/19 0427 09/22/19 0427 09/23/19 0439 09/23/19 1402 09/23/19 2240 09/24/19 0345  HGB 13.3   < > 13.4  --   --  12.5  HCT 41.4  --  41.9  --   --  39.7  PLT 298  --  253  --   --  271  HEPARINUNFRC  --   --   --  0.69 0.47 0.39  CREATININE 0.61  --  0.68  --   --  0.61  TROPONINIHS  --   --  10  --   --   --    < > = values in this interval not displayed.   Estimated Creatinine Clearance: 44.1 mL/min (by C-G formula based on SCr of 0.61 mg/dL).  Medical History: Past Medical History:  Diagnosis Date  . Allergy    SEASONAL  . Anemia   . Arthritis   . Asthma    "slight"   . Baker's cyst of knee, right   . CAD (coronary artery disease)    a. Myoview 10/15 - normal EF 70% // Myoview 11/16: EF 75%, normal perfusion, (there was no blood pressure drop at low level exercise with Lexiscan infusion), low risk study // c. LHC 3/17 - LAD irregs, oLCx 70 (neg FFR), mRCA 30, EF 55-65% >> med Rx // Myoview 07/2018:  EF 73, extracardiac uptake, no significant ischemia (reviewed with Dr. Burt Knack), Low Risk   . Carotid stenosis    a. Carotid US 9/15 - Bilateral 1-39% ICA >> FU 2 years // b. Bilateral ICA 1-39 >> FU prn // Carotid US 06/2018: bilat 1-39; fu prn  . Dyspnea    due to Sarcoidosis. When is windy or humed  . Dysrhythmia    fast heart rate  . Elbow fracture  04/2017   Right, had surgery  . Esophageal reflux   . Hematochezia   . History of echocardiogram    a. Echo 10/15 - EF 60-65%, no RWMA  . HLD (hyperlipidemia)   . Hx of colonoscopy   . Osteoporosis   . Other chronic pulmonary heart diseases   . Paroxysmal supraventricular tachycardia ([place])   . Pneumonia   . Sarcoidosis    Medications:  Scheduled:  . amoxicillin-clavulanate  1 tablet Oral Q12H  . apixaban  2.5 mg Oral BID  . aspirin EC  81 mg Oral Daily  . benzonatate  100 mg Oral TID  . chlorpheniramine-HYDROcodone  5 mL Oral Q12H  . diltiazem  240 mg Oral QHS  . feeding supplement (ENSURE ENLIVE)  237 mL Oral BID BM  . fluticasone  2 spray Each Nare BID  . guaiFENesin  600 mg Oral BID  . insulin aspart  0-9 Units Subcutaneous TID WC  . ipratropium-albuterol  3 mL Nebulization BID  . lactulose  20 g Oral Daily  .  loratadine  10 mg Oral Daily  . melatonin  10 mg Oral QHS  . mometasone-formoterol  2 puff Inhalation BID  . multivitamin with minerals  1 tablet Oral Daily  . nystatin  5 mL Oral QID  . pantoprazole  40 mg Oral Daily  . polyethylene glycol  17 g Oral Daily  . predniSONE  50 mg Oral Q breakfast  . rosuvastatin  10 mg Oral Q M,W,F  . senna-docusate  1 tablet Oral BID  . sodium chloride HYPERTONIC  4 mL Nebulization Daily   Assessment: Pharmacy is consulted to dose heparin in 85 yo female diagnmosed with atrial fibrillation. Pt not on any blood thinners PTA. Pt previously on enoxaparin 40 mg daily. Last dose on 8/1 at 2126.  Cards to consider need for long term anti-coagulation  Today, 09/24/19  Hgb 12.5, plt 271, SCr wnl  Heparin level 0.39 units/ml on 650 units/hr  No bleeding or infusion related issues reported by nursing  Goal of Therapy:  Heparin level 0.3-0.7 units/ml Monitor platelets by anticoagulation protocol: Yes   Plan:    Continue heparin drip at 650 units/hr   Daily CBC & heparin level while on heparin  Monitor for signs and symptoms  of bleeding  Minda Ditto PharmD 09/24/2019, 11:22 AM   Addendum 1200 Heparin discontinued Start Apixaban for non-valvular Afib Apixaban 2.5mg  bid (reduced dose with TBW < 60kg, age > 10yr) Plan education by Rx  Minda Ditto PharmD 09/24/2019, 12:10 PM

## 2019-09-24 NOTE — Progress Notes (Addendum)
Progress Note  Patient Name: [name] Date of Encounter: 09/24/2019  Primary Cardiologist: Sherren Mocha, MD   Subjective   She has had more PAF this am and is symptomatic with it.  Currently in NSR but in and out of PAF.  Denies any chest pain.   Inpatient Medications    Scheduled Meds: . amoxicillin-clavulanate  1 tablet Oral Q12H  . aspirin EC  81 mg Oral Daily  . benzonatate  100 mg Oral TID  . chlorpheniramine-HYDROcodone  5 mL Oral Q12H  . diltiazem  240 mg Oral QHS  . feeding supplement (ENSURE ENLIVE)  237 mL Oral BID BM  . fluticasone  2 spray Each Nare BID  . guaiFENesin  600 mg Oral BID  . insulin aspart  0-9 Units Subcutaneous TID WC  . ipratropium-albuterol  3 mL Nebulization BID  . lactulose  20 g Oral Daily  . loratadine  10 mg Oral Daily  . melatonin  10 mg Oral QHS  . mometasone-formoterol  2 puff Inhalation BID  . multivitamin with minerals  1 tablet Oral Daily  . nystatin  5 mL Oral QID  . pantoprazole  40 mg Oral Daily  . polyethylene glycol  17 g Oral Daily  . predniSONE  50 mg Oral Q breakfast  . rosuvastatin  10 mg Oral Q M,W,F  . senna-docusate  1 tablet Oral BID  . sodium chloride HYPERTONIC  4 mL Nebulization Daily   Continuous Infusions: . heparin 650 Units/hr (09/23/19 1506)   PRN Meds: acetaminophen, albuterol, alum & mag hydroxide-simeth, metoprolol tartrate, ondansetron (ZOFRAN) IV, traMADol   Vital Signs    Vitals:   09/23/19 2108 09/24/19 0433 09/24/19 0740 09/24/19 0744  BP:  131/60    Pulse:  69    Resp:  19    Temp:  98 F (36.7 C)    TempSrc:      SpO2: 95% 98% 95% 95%  Weight:      Height:        Intake/Output Summary (Last 24 hours) at 09/24/2019 1026 Last data filed at 09/24/2019 0451 Gross per 24 hour  Intake 360 ml  Output 700 ml  Net -340 ml   Filed Weights   09/18/19 1643  Weight: 53.1 kg    Telemetry    NSR with runs of PAF - Personally Reviewed  ECG    No new EKG to review - Personally  Reviewed  Physical Exam   GEN: No acute distress.   Neck: No JVD Cardiac: RRR, no murmurs, rubs, or gallops.  Respiratory: diffuse rhonchi and wheezes GI: Soft, nontender, non-distended  MS: No edema; No deformity. Neuro:  Nonfocal  Psych: Normal affect   Labs    Chemistry Recent Labs  Lab 09/18/19 1650 09/18/19 1650 09/19/19 1340 09/21/19 0401 09/22/19 0427 09/23/19 0439 09/24/19 0345  NA 133*   < > 138   < > 134* 134* 133*  K 4.7   < > 3.7   < > 4.5 4.8 5.1  CL 100   < > 101   < > 101 99 100  CO2 23   < > 27   < > 26 26 26   GLUCOSE 161*   < > 129*   < > 112* 172* 178*  BUN 19   < > 16   < > 26* 29* 27*  CREATININE 0.56   < > 0.62   < > 0.61 0.68 0.61  CALCIUM 8.6*   < >  9.1   < > 8.7* 8.6* 8.4*  PROT 6.1*  --  6.6  --   --   --   --   ALBUMIN 3.4*  --  3.5  --   --   --   --   AST 15  --  15  --   --   --   --   ALT 18  --  17  --   --   --   --   ALKPHOS 90  --  90  --   --   --   --   BILITOT 0.7  --  0.5  --   --   --   --   GFRNONAA >60   < > >60   < > >60 >60 >60  GFRAA >60   < > >60   < > >60 >60 >60  ANIONGAP 10   < > 10   < > 7 9 7    < > = values in this interval not displayed.     Hematology Recent Labs  Lab 09/22/19 0427 09/23/19 0439 09/24/19 0345  WBC 20.1* 17.3* 22.8*  RBC 4.25 4.28 3.96  HGB 13.3 13.4 12.5  HCT 41.4 41.9 39.7  MCV 97.4 97.9 100.3*  MCH 31.3 31.3 31.6  MCHC 32.1 32.0 31.5  RDW 16.1* 16.1* 16.1*  PLT 298 253 271    Cardiac EnzymesNo results for input(s): TROPONINI in the last 168 hours. No results for input(s): TROPIPOC in the last 168 hours.   BNPNo results for input(s): BNP, PROBNP in the last 168 hours.   DDimer No results for input(s): DDIMER in the last 168 hours.   Radiology    ECHOCARDIOGRAM COMPLETE  Result Date: 09/23/2019    ECHOCARDIOGRAM REPORT   Patient Name:   LETECIA Mcgrath Date of Exam: 09/23/2019 Medical Rec #:  [id number]        Height:       63.0 in Accession #:    [id number]       Weight:        117.0 lb Date of Birth:  10/12/1934        BSA:          1.540 m Patient Age:    85 years         BP:           134/60 mmHg Patient Gender: F                HR:           68 bpm. Exam Location:  Inpatient Procedure: 2D Echo, Color Doppler and Cardiac Doppler Indications:    I48.91* Unspecified atrial fibrillation  History:        Patient has prior history of Echocardiogram examinations, most                 recent 07/19/2019. CAD; Risk Factors:Hypertension and                 Dyslipidemia.  Sonographer:    Raquel Sarna Senior RDCS Referring Phys: [place]  Sonographer Comments: Difficult parasternals, very medial windows. IMPRESSIONS  1. Left ventricular ejection fraction, by estimation, is 55 to 60%. The left ventricle has normal function. The left ventricle has no regional wall motion abnormalities. Left ventricular diastolic parameters are consistent with Grade I diastolic dysfunction (impaired relaxation).  2. Right ventricular systolic function is normal. The right ventricular size is normal. There is normal  pulmonary artery systolic pressure. The estimated right ventricular systolic pressure is 29.5 mmHg.  3. The mitral valve is degenerative. Trivial mitral valve regurgitation. No evidence of mitral stenosis.  4. The aortic valve is grossly normal. Aortic valve regurgitation is not visualized. No aortic stenosis is present.  5. The inferior vena cava is normal in size with greater than 50% respiratory variability, suggesting right atrial pressure of 3 mmHg. FINDINGS  Left Ventricle: Left ventricular ejection fraction, by estimation, is 55 to 60%. The left ventricle has normal function. The left ventricle has no regional wall motion abnormalities. The left ventricular internal cavity size was normal in size. There is  no left ventricular hypertrophy. Left ventricular diastolic parameters are consistent with Grade I diastolic dysfunction (impaired relaxation). Right Ventricle: The right ventricular size  is normal. No increase in right ventricular wall thickness. Right ventricular systolic function is normal. There is normal pulmonary artery systolic pressure. The tricuspid regurgitant velocity is 2.30 m/s, and  with an assumed right atrial pressure of 3 mmHg, the estimated right ventricular systolic pressure is 18.8 mmHg. Left Atrium: Left atrial size was normal in size. Right Atrium: Right atrial size was normal in size. Pericardium: There is no evidence of pericardial effusion. Mitral Valve: The mitral valve is degenerative in appearance. Normal mobility of the mitral valve leaflets. Mild mitral annular calcification. Trivial mitral valve regurgitation. No evidence of mitral valve stenosis. Tricuspid Valve: The tricuspid valve is normal in structure. Tricuspid valve regurgitation is not demonstrated. No evidence of tricuspid stenosis. Aortic Valve: The aortic valve is grossly normal.. There is moderate thickening of the aortic valve. Aortic valve regurgitation is not visualized. No aortic stenosis is present. There is moderate thickening of the aortic valve. Pulmonic Valve: The pulmonic valve was normal in structure. Pulmonic valve regurgitation is not visualized. No evidence of pulmonic stenosis. Aorta: The aortic root is normal in size and structure. Venous: The inferior vena cava is normal in size with greater than 50% respiratory variability, suggesting right atrial pressure of 3 mmHg. IAS/Shunts: No atrial level shunt detected by color flow Doppler.  LEFT VENTRICLE PLAX 2D LVIDd:         4.50 cm  Diastology LVIDs:         3.10 cm  LV e' lateral:   8.38 cm/s LV PW:         0.70 cm  LV E/e' lateral: 7.6 LV IVS:        1.00 cm  LV e' medial:    7.29 cm/s LVOT diam:     2.00 cm  LV E/e' medial:  8.7 LV SV:         54 LV SV Index:   35 LVOT Area:     3.14 cm  RIGHT VENTRICLE RV S prime:     17.00 cm/s TAPSE (M-mode): 2.0 cm LEFT ATRIUM             Index       RIGHT ATRIUM           Index LA diam:        3.30 cm  2.14 cm/m  RA Area:     14.40 cm LA Vol (A2C):   44.0 ml 28.58 ml/m RA Volume:   32.20 ml  20.91 ml/m LA Vol (A4C):   48.4 ml 31.44 ml/m LA Biplane Vol: 47.0 ml 30.53 ml/m  AORTIC VALVE LVOT Vmax:   84.20 cm/s LVOT Vmean:  57.200 cm/s LVOT VTI:    0.171 m  AORTA Ao Root diam: 2.70 cm MITRAL VALVE               TRICUSPID VALVE MV Area (PHT): 2.91 cm    TR Peak grad:   21.2 mmHg MV Decel Time: 261 msec    TR Vmax:        230.00 cm/s MV E velocity: 63.40 cm/s MV A velocity: 76.30 cm/s  SHUNTS MV E/A ratio:  0.83        Systemic VTI:  0.17 m                            Systemic Diam: 2.00 cm Cherlynn Kaiser MD Electronically signed by Cherlynn Kaiser MD Signature Date/Time: 09/23/2019/7:21:29 PM    Final     Cardiac Studies   2D echo 09/2019 IMPRESSIONS   1. Left ventricular ejection fraction, by estimation, is 55 to 60%. The  left ventricle has normal function. The left ventricle has no regional  wall motion abnormalities. Left ventricular diastolic parameters are  consistent with Grade I diastolic  dysfunction (impaired relaxation).  2. Right ventricular systolic function is normal. The right ventricular  size is normal. There is normal pulmonary artery systolic pressure. The  estimated right ventricular systolic pressure is 28.3 mmHg.  3. The mitral valve is degenerative. Trivial mitral valve regurgitation.  No evidence of mitral stenosis.  4. The aortic valve is grossly normal. Aortic valve regurgitation is not  visualized. No aortic stenosis is present.  5. The inferior vena cava is normal in size with greater than 50%  respiratory variability, suggesting right atrial pressure of 3 mmHg.   Patient Profile     85 y.o. female with a hx of CAD, pulmonary sarcoidosis, HTN, HLD, SVT, bradycardia with surgery and carotid stenosis admitted 09/18/19 with community acquired PNA  who is being seen  for the evaluation of atrial fib at the request of Dr. Posey Pronto.   Assessment & Plan     1. Atrial fib with RVR -likely driven by underlying PNA and hypoxia -noted to have more PAF on tele this am>>currently in NSR -she was symptomatic with her PAF this am -Cardizem CD increased to 240mg  qhs starting tonight -consider B1 selective BB if she continues to have PAF.  Would avoid Amio if possible given her underlying chronic lung disease -CHA2DS2VASC of 5 and she has underlying Pulmonary dz with multiple pulmonary infx so she is at risk for further atrial arrthymias in the future.   -Discussed with TRH and plan to start Eliquis 2.5mg  BID (age>80 and wt< 60kg) and stop IV Heparin gtt  2. PNA and pulmonary sarcoid -per IM  3. ASCAD - last cath 2017 with 70% LCx and normal FFR -neg nuc in 2020.   -denies any anginal symptoms  -hsTroponin 10.   -continue ASA and statin  4. Hx SVT  -continue Diltiazem  For questions or updates, please contact [place] Please consult [email or web address] for contact info under Cardiology/STEMI.      Signed, Fransico Him, MD  09/24/2019, 10:26 AM

## 2019-09-24 NOTE — Progress Notes (Signed)
[facility] Progress Note  Patient: [name]    GBT:[id number]  DOA: 09/18/2019     Date of Service: the patient was seen and examined on 09/24/2019  Brief hospital course: Past medical history of CAD, PSVT, bronchiectasis, sarcoidosis, HLD. Recently treated for pneumonia outpatient with 7-day treatment course with doxycycline followed by another 7-day treatment course for cefdinir along with steroids.  Continues to come to the hospital with worsening shortness of breath and cough. Currently plan is continue antibiotics.  Assessment and Plan: 1.  Community-acquired pneumonia. Bronchiectasis flareup secondary to the pneumonia. Sarcoidosis history. CTA chest negative for any pulmonary embolism. No new findings for sarcoidosis. Persistent bronchiectasis seen. Evidence of bronchopneumonia on right lower lobe as well as on the left side. Procalcitonin elevated.  But trending down. CRP elevated. Continue flutter valve. Initially on no steroids, later on IV steroids Solu-Medrol 40 mg every 12 hours.  Currently on oral prednisone Add Tussionex. Significant improvement after addition of hypertonic saline nebulizers.  Continue flutter valve. Patient reports pain in her left rib cage after coughing.  Add tramadol. Treat for 7 days total antibiotics by p.o.  2.  Hyperglycemia Hemoglobin A1c normal. Likely in the setting of prolonged prednisone course. Monitor.  3.  CAD Continue aspirin  4.  PSVT. Essential hypertension. Continue Cardizem.  5.  HLD Continue statin  6.  GERD Continue PPI Protonix.  7.  Bilateral ear fullness. Examination right ear shows no evidence of fluid behind the drum.  Drum is intact. On the left ear wax plus some amount of clotted blood.  Difficult to see the drum there.  No significant abnormality seen otherwise.  Patient denies any ear pain  8.  Oral thrush. Add nystatin.  9.  New onset paroxysmal A. fib with RVR. had a few episode of A.  fib with RVR requiring IV Cardizem. Currently on IV heparin.  On oral Eliquis per pharmacy. Cardiology consulted.   Echocardiogram shows preserved EF.  No wall motion abnormality.  Grade 1 diastolic dysfunction. Suspect that this is patient's chronic issue which was identified acutely while she is in the hospital on telemetry.  Also more pronounced given her respiratory illness. Patient actually felt RVR on 09/24/2019. Cardizem dose increased, 180 mg twice daily. Total 360 mg daily. Home regimen to 40 mg daily.  Diet: Cardiac diet DVT Prophylaxis:  apixaban (ELIQUIS) tablet 2.5 mg Start: 09/24/19 1200 apixaban (ELIQUIS) tablet 2.5 mg   Advance goals of care discussion: Full code  Family Communication: no family was present at bedside, at the time of interview.  Discussed with daughter on phone.  Disposition:  Status is: Inpatient  Remains inpatient appropriate because:IV treatments appropriate due to intensity of illness or inability to take PO  Dispo: The patient is from: Home              Anticipated d/c is to: Home              Anticipated d/c date is: 2 days              Patient currently is not medically stable to d/c.  Subjective: No nausea no vomiting.  No fever no chills.  No chest pain.  Felt palpitation  Physical Exam:  General: Appear in mild distress, no Rash; Oral Mucosa shows thrush. no Abnormal Neck Mass Or lumps, Conjunctiva normal  Cardiovascular: S1 and S2 Present, no Murmur, Respiratory: increased respiratory effort, Bilateral Air entry present and bilateral Crackles, bilateral  wheezes Abdomen: Bowel Sound  present, Soft and no tenderness Extremities: no Pedal edema, no calf tenderness Neurology: alert and oriented to time, place, and person affect appropriate. no new focal deficit Gait not checked due to patient safety concerns  Vitals:   09/24/19 0433 09/24/19 0740 09/24/19 0744 09/24/19 1324  BP: 131/60   130/71  Pulse: 69   94  Resp: 19   16  Temp: 98  F (36.7 C)   98.5 F (36.9 C)  TempSrc:    Oral  SpO2: 98% 95% 95% 97%  Weight:      Height:        Intake/Output Summary (Last 24 hours) at 09/24/2019 1731 Last data filed at 09/24/2019 0451 Gross per 24 hour  Intake 360 ml  Output 700 ml  Net -340 ml   Filed Weights   09/18/19 1643  Weight: 53.1 kg    Data Reviewed: I have personally reviewed and interpreted daily labs, tele strips, imagings as discussed above. I reviewed all nursing notes, pharmacy notes, vitals, pertinent old records I have discussed plan of care as described above with RN and patient/family.  CBC: Recent Labs  Lab 09/18/19 1650 09/18/19 1650 09/19/19 1340 09/21/19 0401 09/22/19 0427 09/23/19 0439 09/24/19 0345  WBC 27.1*   < > 17.3* 17.4* 20.1* 17.3* 22.8*  NEUTROABS 24.8*  --  15.0*  --   --   --   --   HGB 13.5   < > 13.3 12.4 13.3 13.4 12.5  HCT 41.4   < > 41.0 37.9 41.4 41.9 39.7  MCV 96.7   < > 97.4 98.4 97.4 97.9 100.3*  PLT 242   < > 251 242 298 253 271   < > = values in this interval not displayed.   Basic Metabolic Panel: Recent Labs  Lab 09/19/19 1340 09/21/19 0401 09/22/19 0427 09/23/19 0439 09/24/19 0345  NA 138 137 134* 134* 133*  K 3.7 4.2 4.5 4.8 5.1  CL 101 102 101 99 100  CO2 27 22 26 26 26   GLUCOSE 129* 190* 112* 172* 178*  BUN 16 16 26* 29* 27*  CREATININE 0.62 0.53 0.61 0.68 0.61  CALCIUM 9.1 8.7* 8.7* 8.6* 8.4*  MG 2.2 2.0  --   --   --     Studies: No results found.  Scheduled Meds: . amoxicillin-clavulanate  1 tablet Oral Q12H  . apixaban  2.5 mg Oral BID  . aspirin EC  81 mg Oral Daily  . benzonatate  100 mg Oral TID  . chlorpheniramine-HYDROcodone  5 mL Oral Q12H  . diltiazem  180 mg Oral Q12H  . feeding supplement (ENSURE ENLIVE)  237 mL Oral BID BM  . fluticasone  2 spray Each Nare BID  . guaiFENesin  600 mg Oral BID  . insulin aspart  0-9 Units Subcutaneous TID WC  . ipratropium-albuterol  3 mL Nebulization BID  . lactulose  20 g Oral Daily  .  loratadine  10 mg Oral Daily  . melatonin  10 mg Oral QHS  . mometasone-formoterol  2 puff Inhalation BID  . multivitamin with minerals  1 tablet Oral Daily  . nystatin  5 mL Oral QID  . pantoprazole  40 mg Oral Daily  . polyethylene glycol  17 g Oral Daily  . predniSONE  50 mg Oral Q breakfast  . rosuvastatin  10 mg Oral Q M,W,F  . senna-docusate  1 tablet Oral BID  . sodium chloride HYPERTONIC  4 mL Nebulization Daily   Continuous  Infusions:  PRN Meds: acetaminophen, albuterol, alum & mag hydroxide-simeth, ondansetron (ZOFRAN) IV, traMADol  Time spent: 35 minutes  Author: Berle Mull, MD [facility] 09/24/2019 5:31 PM  To reach On-call, see care teams to locate the attending and reach out via [email or web address]. Between 7PM-7AM, please contact night-coverage If you still have difficulty reaching the attending provider, please page the [facility] (Director on Call) for [facility] on amion for assistance.

## 2019-09-24 NOTE — Progress Notes (Signed)
[place] for IV heparin Indication: atrial fibrillation  Allergies  Allergen Reactions  . Levaquin [Levofloxacin] Other (See Comments)    Leg pain .Marland Kitchen Per doctor not to take again  . Sulfonamide Derivatives     Pt unsure  . Oxycodone Nausea Only    Can take oxycodone with Zofran  . Morphine Nausea Only   Patient Measurements: Height: 5\' 3"  (160 cm) Weight: 53.1 kg (117 lb) IBW/kg (Calculated) : 52.4 Heparin Dosing Weight:    Vital Signs: Temp: 98 F (36.7 C) (08/03 0433) BP: 131/60 (08/03 0433) Pulse Rate: 69 (08/03 0433)  Labs: Recent Labs    09/22/19 0427 09/22/19 0427 09/23/19 0439 09/23/19 1402 09/23/19 2240 09/24/19 0345  HGB 13.3   < > 13.4  --   --  12.5  HCT 41.4  --  41.9  --   --  39.7  PLT 298  --  253  --   --  271  HEPARINUNFRC  --   --   --  0.69 0.47 0.39  CREATININE 0.61  --  0.68  --   --  0.61  TROPONINIHS  --   --  10  --   --   --    < > = values in this interval not displayed.   Estimated Creatinine Clearance: 44.1 mL/min (by C-G formula based on SCr of 0.61 mg/dL).  Medical History: Past Medical History:  Diagnosis Date  . Allergy    SEASONAL  . Anemia   . Arthritis   . Asthma    "slight"   . Baker's cyst of knee, right   . CAD (coronary artery disease)    a. Myoview 10/15 - normal EF 70% // Myoview 11/16: EF 75%, normal perfusion, (there was no blood pressure drop at low level exercise with Lexiscan infusion), low risk study // c. LHC 3/17 - LAD irregs, oLCx 70 (neg FFR), mRCA 30, EF 55-65% >> med Rx // Myoview 07/2018:  EF 73, extracardiac uptake, no significant ischemia (reviewed with Dr. Burt Knack), Low Risk   . Carotid stenosis    a. Carotid US 9/15 - Bilateral 1-39% ICA >> FU 2 years // b. Bilateral ICA 1-39 >> FU prn // Carotid US 06/2018: bilat 1-39; fu prn  . Dyspnea    due to Sarcoidosis. When is windy or humed  . Dysrhythmia    fast heart rate  . Elbow fracture 04/2017   Right, had  surgery  . Esophageal reflux   . Hematochezia   . History of echocardiogram    a. Echo 10/15 - EF 60-65%, no RWMA  . HLD (hyperlipidemia)   . Hx of colonoscopy   . Osteoporosis   . Other chronic pulmonary heart diseases   . Paroxysmal supraventricular tachycardia ([place])   . Pneumonia   . Sarcoidosis    Medications:  Scheduled:  . amoxicillin-clavulanate  1 tablet Oral Q12H  . aspirin EC  81 mg Oral Daily  . benzonatate  100 mg Oral TID  . chlorpheniramine-HYDROcodone  5 mL Oral Q12H  . diltiazem  180 mg Oral QHS  . docusate sodium  100 mg Oral QHS  . feeding supplement (ENSURE ENLIVE)  237 mL Oral BID BM  . fluticasone  2 spray Each Nare BID  . guaiFENesin  600 mg Oral BID  . insulin aspart  0-9 Units Subcutaneous TID WC  . ipratropium-albuterol  3 mL Nebulization BID  . loratadine  10 mg Oral Daily  .  melatonin  10 mg Oral QHS  . methylPREDNISolone (SOLU-MEDROL) injection  40 mg Intravenous Q12H  . mometasone-formoterol  2 puff Inhalation BID  . multivitamin with minerals  1 tablet Oral Daily  . nystatin  5 mL Oral QID  . pantoprazole  40 mg Oral Daily  . polyethylene glycol  17 g Oral Daily  . rosuvastatin  10 mg Oral Q M,W,F  . sodium chloride HYPERTONIC  4 mL Nebulization Daily   Assessment: Pharmacy is consulted to dose heparin in 85 yo female diagnmosed with atrial fibrillation. Pt not on any blood thinners PTA. Pt previously on enoxaparin 40 mg daily. Last dose on 8/1 at 2126.  Cards to consider need for long term anti-coagulation  Today, 09/24/19  Hgb 12.5, plt 271, SCr wnl  Heparin level 0.39 units/ml on 650 units/hr  No bleeding or infusion related issues reported by nursing  Goal of Therapy:  Heparin level 0.3-0.7 units/ml Monitor platelets by anticoagulation protocol: Yes   Plan:    Continue heparin drip at 650 units/hr   Daily CBC & heparin level while on heparin  Monitor for signs and symptoms of bleeding  Minda Ditto PharmD 09/24/2019, 9:27  AM

## 2019-09-25 DIAGNOSIS — J690 Pneumonitis due to inhalation of food and vomit: Secondary | ICD-10-CM

## 2019-09-25 LAB — BASIC METABOLIC PANEL
Anion gap: 7 (ref 5–15)
BUN: 34 mg/dL — ABNORMAL HIGH (ref 8–23)
CO2: 27 mmol/L (ref 22–32)
Calcium: 8.6 mg/dL — ABNORMAL LOW (ref 8.9–10.3)
Chloride: 98 mmol/L (ref 98–111)
Creatinine, Ser: 0.75 mg/dL (ref 0.44–1.00)
GFR calc Af Amer: >60 mL/min (ref >60)
GFR calc non Af Amer: >60 mL/min (ref >60)
Glucose, Bld: 130 mg/dL — ABNORMAL HIGH (ref 70–99)
Potassium: 5.6 mmol/L — ABNORMAL HIGH (ref 3.5–5.1)
Sodium: 132 mmol/L — ABNORMAL LOW (ref 135–145)

## 2019-09-25 LAB — GLUCOSE, CAPILLARY
Glucose-Capillary: 92 mg/dL (ref 70–99)
Glucose-Capillary: 90 mg/dL (ref 70–99)
Glucose-Capillary: 336 mg/dL — ABNORMAL HIGH (ref 70–99)
Glucose-Capillary: 186 mg/dL — ABNORMAL HIGH (ref 70–99)

## 2019-09-25 LAB — CBC
HCT: 39.3 % (ref 36.0–46.0)
Hemoglobin: 12.5 g/dL (ref 12.0–15.0)
MCH: 31 pg (ref 26.0–34.0)
MCHC: 31.8 g/dL (ref 30.0–36.0)
MCV: 97.5 fL (ref 80.0–100.0)
Platelets: 258 10*3/uL (ref 150–400)
RBC: 4.03 MIL/uL (ref 3.87–5.11)
RDW: 16.4 % — ABNORMAL HIGH (ref 11.5–15.5)
WBC: 21 10*3/uL — ABNORMAL HIGH (ref 4.0–10.5)
nRBC: 0 % (ref 0.0–0.2)

## 2019-09-25 MED ORDER — DILTIAZEM HCL ER COATED BEADS 180 MG PO CP24
180.0000 mg | ORAL_CAPSULE | Freq: Every evening | ORAL | Status: DC
  Administered 2019-09-25 – 2019-09-26 (×2): 180 mg via ORAL
  Filled 2019-09-25 (×2): qty 1

## 2019-09-25 MED ORDER — LEVALBUTEROL HCL 1.25 MG/0.5ML IN NEBU
1.2500 mg | INHALATION_SOLUTION | Freq: Two times a day (BID) | RESPIRATORY_TRACT | Status: DC
  Administered 2019-09-25 – 2019-09-26 (×2): 1.25 mg via RESPIRATORY_TRACT
  Filled 2019-09-25 (×3): qty 0.5

## 2019-09-25 MED ORDER — IPRATROPIUM BROMIDE 0.02 % IN SOLN
0.5000 mg | Freq: Two times a day (BID) | RESPIRATORY_TRACT | Status: DC
  Administered 2019-09-25 – 2019-09-26 (×2): 0.5 mg via RESPIRATORY_TRACT
  Filled 2019-09-25 (×3): qty 2.5

## 2019-09-25 NOTE — Progress Notes (Signed)
Physical Therapy Treatment Patient Details Name: [name] MRN: [id number] DOB: Nov 20, 1934 Today's Date: 09/25/2019    History of Present Illness Past medical history of CAD, PSVT, bronchiectasis, sarcoidosis, HLD.Recently treated for pneumonia outpatient. Comes to ED 7/28  with worsening shortness of breath and cough.    PT Comments    Pt looks much better today.  Assisted with amb to bathroom then in hallway.  General Gait Details: slow but steady and tolerated an increased distance on RA avg 94% and HR 79 (much improved) Pt is motivated to get back home where she lives with her daughter.   Follow Up Recommendations  Home health PT;Supervision - Intermittent     Equipment Recommendations  None recommended by PT    Recommendations for Other Services       Precautions / Restrictions Precautions Precautions: Fall Precaution Comments: monitor O2 and HR Restrictions Weight Bearing Restrictions: No    Mobility  Bed Mobility               General bed mobility comments: OOB in recliner  Transfers Overall transfer level: Needs assistance Equipment used: Rolling walker (2 wheeled) Transfers: Sit to/from [facility] Sit to Stand: Supervision Stand pivot transfers: Supervision;Min guard       General transfer comment: 25% VC's on proper hand placement and safety with turns    also assisted with a toilet tranfer  Ambulation/Gait Ambulation/Gait assistance: Supervision;Min guard Gait Distance (Feet): 85 Feet Assistive device: Rolling walker (2 wheeled) Gait Pattern/deviations: Step-through pattern;Trunk flexed Gait velocity: decreased   General Gait Details: slow but steady and tolerated an increased distance on RA avg 94% and HR 79 (much improved)   Stairs             Wheelchair Mobility    Modified Rankin (Stroke Patients Only)       Balance                                            Cognition  Arousal/Alertness: Awake/alert Behavior During Therapy: WFL for tasks assessed/performed Overall Cognitive Status: Within Functional Limits for tasks assessed                                 General Comments: AxO x 3 very pleasant      Exercises      General Comments        Pertinent Vitals/Pain Pain Assessment: No/denies pain Pain Location: c/o about her reflux Pain Descriptors / Indicators: Burning Pain Intervention(s): Monitored during session    Home Living                      Prior Function            PT Goals (current goals can now be found in the care plan section) Progress towards PT goals: Progressing toward goals    Frequency    Min 3X/week      PT Plan Current plan remains appropriate    Co-evaluation              AM-PAC PT "6 Clicks" Mobility   Outcome Measure  Help needed turning from your back to your side while in a flat bed without using bedrails?: None Help needed moving from lying on your back to sitting on the side  of a flat bed without using bedrails?: None Help needed moving to and from a bed to a chair (including a wheelchair)?: A Little Help needed standing up from a chair using your arms (e.g., wheelchair or bedside chair)?: A Little Help needed to walk in hospital room?: A Little Help needed climbing 3-5 steps with a railing? : A Little 6 Click Score: 20    End of Session Equipment Utilized During Treatment: Gait belt Activity Tolerance: Patient tolerated treatment well Patient left: in chair;with call bell/phone within reach;with chair alarm set Nurse Communication: Mobility status;Other (comment) (c/o Ruflux) PT Visit Diagnosis: Unsteadiness on feet (R26.81);Pain     Time: 1140-1205 PT Time Calculation (min) (ACUTE ONLY): 25 min  Charges:  $Gait Training: 8-22 mins $Therapeutic Activity: 8-22 mins                     Rica Koyanagi  PTA Acute  Rehabilitation Services Pager       [phone] Office      [phone]

## 2019-09-25 NOTE — Progress Notes (Signed)
PROGRESS NOTE    [name]  OZD:[id number] DOB: 1935-01-31 DOA: 09/18/2019 PCP: [name], MD   Brief Narrative:  85 yo Past medical history of CAD, PSVT, bronchiectasis, sarcoidosis, HLD. Recently treated for pneumonia outpatient with 7-day treatment course with doxycycline followed by another 7-day treatment course for cefdinir along with steroids.  Continues to come to the hospital with worsening shortness of breath and cough.    Assessment & Plan:   Principal Problem:   Pneumonia Active Problems:   HLD (hyperlipidemia)   Paroxysmal supraventricular tachycardia (HCC)   GERD   CAD (coronary artery disease)   Aspiration pneumonia (HCC)   Community-acquired pneumonia. Bronchiectasis flareup secondary to the pneumonia. Sarcoidosis history. -CTA chest negative for PE, persistent bronchiectasis with concerns of bronchopneumonia.  Elevated procalcitonin, CRP -Solu-Medrol 40 mg transition to p.o. prednisone -Antitussives, incentive spirometer and flutter valve -Change bronchodilators to Xopenex and ipratropium -Antibiotics-Augmentin, complete total 7 days of antibiotic course  New onset paroxysmal A. fib with RVR. -Echocardiogram preserved EF with grade 1 DD -Seen by cardiology, dose adjustment according to their service -P.o. Eliquis -Due to junctional bradycardia, Cardizem reduced to 180 mg daily -TSH normal  Hyperglycemia secondary to steroid use -A1c 5.8, continue to monitor  Coronary artery disease without chest pain -Continue aspirin and statin  Intermittent PSVT, essential hypertension Continue Cardizem.  Hyperlipidemia -On Crestor  GERD -PPI daily  Oral thrush -On nystatin  PT recommending home health  DVT prophylaxis: L Eliquis Code Status: Full code Family Communication:    Status is: Inpatient  Remains inpatient appropriate because:Hemodynamically unstable   Dispo: The patient is from: Home              Anticipated d/c  is to: Home              Anticipated d/c date is: 1 day              Patient currently is not medically stable to d/c.  Cardizem dose has been reduced to ensure her bradycardia improves.   Body mass index is 20.73 kg/m.      Subjective: Feeling some dizziness this morning with ambulation also has been having a lot of coughing  Review of Systems Otherwise negative except as per HPI, including: General: Denies fever, chills, night sweats or unintended weight loss. Resp: Denies hemoptysis Cardiac: Denies chest pain, palpitations, orthopnea, paroxysmal nocturnal dyspnea. GI: Denies abdominal pain, nausea, vomiting, diarrhea or constipation GU: Denies dysuria, frequency, hesitancy or incontinence MS: Denies muscle aches, joint pain or swelling Neuro: Denies headache, neurologic deficits (focal weakness, numbness, tingling), abnormal gait Psych: Denies anxiety, depression, SI/HI/AVH Skin: Denies new rashes or lesions ID: Denies sick contacts, exotic exposures, travel  Examination:  General exam: Appears calm and comfortable  Respiratory system: Diffuse bilateral coarse breath sounds Cardiovascular system: S1 & S2 heard, RRR. No JVD, murmurs, rubs, gallops or clicks. No pedal edema. Gastrointestinal system: Abdomen is nondistended, soft and nontender. No organomegaly or masses felt. Normal bowel sounds heard. Central nervous system: Alert and oriented. No focal neurological deficits. Extremities: Symmetric 5 x 5 power. Skin: No rashes, lesions or ulcers Psychiatry: Judgement and insight appear normal. Mood & affect appropriate.     Objective: Vitals:   09/24/19 1324 09/24/19 1927 09/24/19 1938 09/25/19 0328  BP: 130/71  (!) 123/97 (!) 105/58  Pulse: 94  66 (!) 48  Resp: 16  18 16   Temp: 98.5 F (36.9 C)  98.3 F (36.8 C) 98.1 F (36.7 C)  TempSrc: Oral  Oral Oral  SpO2: 97% 96% 97% 98%  Weight:      Height:        Intake/Output Summary (Last 24 hours) at 09/25/2019  0844 Last data filed at 09/25/2019 0334 Gross per 24 hour  Intake --  Output 1050 ml  Net -1050 ml   Filed Weights   09/18/19 1643  Weight: 53.1 kg     Data Reviewed:   CBC: Recent Labs  Lab 09/18/19 1650 09/18/19 1650 09/19/19 1340 09/19/19 1340 09/21/19 0401 09/22/19 0427 09/23/19 0439 09/24/19 0345 09/25/19 0343  WBC 27.1*   < > 17.3*   < > 17.4* 20.1* 17.3* 22.8* 21.0*  NEUTROABS 24.8*  --  15.0*  --   --   --   --   --   --   HGB 13.5   < > 13.3   < > 12.4 13.3 13.4 12.5 12.5  HCT 41.4   < > 41.0   < > 37.9 41.4 41.9 39.7 39.3  MCV 96.7   < > 97.4   < > 98.4 97.4 97.9 100.3* 97.5  PLT 242   < > 251   < > 242 298 253 271 258   < > = values in this interval not displayed.   Basic Metabolic Panel: Recent Labs  Lab 09/19/19 1340 09/19/19 1340 09/21/19 0401 09/22/19 0427 09/23/19 0439 09/24/19 0345 09/25/19 0343  NA 138   < > 137 134* 134* 133* 132*  K 3.7   < > 4.2 4.5 4.8 5.1 5.6*  CL 101   < > 102 101 99 100 98  CO2 27   < > 22 26 26 26 27   GLUCOSE 129*   < > 190* 112* 172* 178* 130*  BUN 16   < > 16 26* 29* 27* 34*  CREATININE 0.62   < > 0.53 0.61 0.68 0.61 0.75  CALCIUM 9.1   < > 8.7* 8.7* 8.6* 8.4* 8.6*  MG 2.2  --  2.0  --   --   --   --    < > = values in this interval not displayed.   GFR: Estimated Creatinine Clearance: 44.1 mL/min (by C-G formula based on SCr of 0.75 mg/dL). Liver Function Tests: Recent Labs  Lab 09/18/19 1650 09/19/19 1340  AST 15 15  ALT 18 17  ALKPHOS 90 90  BILITOT 0.7 0.5  PROT 6.1* 6.6  ALBUMIN 3.4* 3.5   No results for input(s): LIPASE, AMYLASE in the last 168 hours. No results for input(s): AMMONIA in the last 168 hours. Coagulation Profile: Recent Labs  Lab 09/18/19 1650  INR 1.0   Cardiac Enzymes: No results for input(s): CKTOTAL, CKMB, CKMBINDEX, TROPONINI in the last 168 hours. BNP (last 3 results) No results for input(s): PROBNP in the last 8760 hours. HbA1C: No results for input(s): HGBA1C in  the last 72 hours. CBG: Recent Labs  Lab 09/24/19 0748 09/24/19 1134 09/24/19 1645 09/24/19 2150 09/25/19 0739  GLUCAP 222*  222* 244* 175* 146* 92   Lipid Profile: No results for input(s): CHOL, HDL, LDLCALC, TRIG, CHOLHDL, LDLDIRECT in the last 72 hours. Thyroid Function Tests: Recent Labs    09/23/19 0439  TSH 0.787   Anemia Panel: No results for input(s): VITAMINB12, FOLATE, FERRITIN, TIBC, IRON, RETICCTPCT in the last 72 hours. Sepsis Labs: Recent Labs  Lab 09/18/19 1650 09/18/19 1907 09/19/19 1340 09/20/19 0311 09/21/19 0401  PROCALCITON  --   --  2.26 1.45 0.80  LATICACIDVEN 0.9 0.8  --   --   --     Recent Results (from the past 240 hour(s))  Blood Culture (routine x 2)     Status: None   Collection Time: 09/18/19  4:50 PM   Specimen: BLOOD RIGHT HAND  Result Value Ref Range Status   Specimen Description   Final    BLOOD RIGHT HAND Performed at [facility] [address], [place], [place] [address]    Special Requests   Final    BOTTLES DRAWN AEROBIC AND ANAEROBIC Blood Culture results may not be optimal due to an inadequate volume of blood received in culture bottles Performed at [facility] [address] [place], [place] [address]    Culture   Final    NO GROWTH 5 DAYS Performed at [facility], [place] [address], [place], [place] [address]    Report Status 09/23/2019 FINAL  Final  Blood Culture (routine x 2)     Status: None   Collection Time: 09/18/19  4:50 PM   Specimen: BLOOD LEFT FOREARM  Result Value Ref Range Status   Specimen Description   Final    BLOOD LEFT FOREARM Performed at [facility] [address] [place], [place] [address]    Special Requests   Final    BOTTLES DRAWN AEROBIC AND ANAEROBIC Blood Culture adequate volume Performed at [facility] [address], [place], [place] [address]    Culture   Final    NO GROWTH 5 DAYS Performed at  [facility], [place] [address], [place], [place] [address]    Report Status 09/23/2019 FINAL  Final  SARS Coronavirus 2 by RT PCR (hospital order, performed in [facility] hospital lab) Nasopharyngeal Nasopharyngeal Swab     Status: None   Collection Time: 09/18/19  5:01 PM   Specimen: Nasopharyngeal Swab  Result Value Ref Range Status   SARS Coronavirus 2 NEGATIVE NEGATIVE Final    Comment: (NOTE) SARS-CoV-2 target nucleic acids are NOT DETECTED.  The SARS-CoV-2 RNA is generally detectable in upper and lower respiratory specimens during the acute phase of infection. The lowest concentration of SARS-CoV-2 viral copies this assay can detect is 250 copies / mL. A negative result does not preclude SARS-CoV-2 infection and should not be used as the sole basis for treatment or other patient management decisions.  A negative result may occur with improper specimen collection / handling, submission of specimen other than nasopharyngeal swab, presence of viral mutation(s) within the areas targeted by this assay, and inadequate number of viral copies (<250 copies / mL). A negative result must be combined with clinical observations, patient history, and epidemiological information.  Fact Sheet for Patients:   StrictlyIdeas.no  Fact Sheet for Healthcare Providers: BankingDealers.co.za  This test is not yet approved or  cleared by the [place] FDA and has been authorized for detection and/or diagnosis of SARS-CoV-2 by FDA under an Emergency Use Authorization (EUA).  This EUA will remain in effect (meaning this test can be used) for the duration of the COVID-19 declaration under Section 564(b)(1) of the Act, 21 U.S.C. section 360bbb-3(b)(1), unless the authorization is terminated or revoked sooner.  Performed at [facility], [place] [address] [place], [place] [address]   Urine culture     Status: Abnormal    Collection Time: 09/18/19  9:00 PM   Specimen: In/Out Cath Urine  Result Value Ref Range Status  Specimen Description   Final    IN/OUT CATH URINE Performed at [facility] [address], [place], [place] [address]    Special Requests   Final    NONE Performed at [facility], [place] [address], [place], [place] [address]    Culture (A)  Final    <10,000 COLONIES/mL INSIGNIFICANT GROWTH Performed at [facility] [address], [place], [place] [address]    Report Status 09/19/2019 FINAL  Final  Expectorated sputum assessment w rflx to resp cult     Status: None   Collection Time: 09/20/19 10:50 AM   Specimen: Expectorated Sputum  Result Value Ref Range Status   Specimen Description EXPECTORATED SPUTUM  Final   Special Requests NONE  Final   Sputum evaluation   Final    Sputum specimen not acceptable for testing.  Please recollect.   INFORMED PEINSON,L. RN @1155  ON 7.30.2021 BY [facility] Performed at [facility], [place] [address] [place], [place] [address]    Report Status 09/20/2019 FINAL  Final         Radiology Studies: ECHOCARDIOGRAM COMPLETE  Result Date: 09/23/2019    ECHOCARDIOGRAM REPORT   Patient Name:   [name] Date of Exam: 09/23/2019 Medical Rec #:  [id number]        Height:       63.0 in Accession #:    [id number]       Weight:       117.0 lb Date of Birth:  Apr 07, 1934        BSA:          1.540 m Patient Age:    48 years         BP:           134/60 mmHg Patient Gender: F                HR:           68 bpm. Exam Location:  Inpatient Procedure: 2D Echo, Color Doppler and Cardiac Doppler Indications:    I48.91* Unspecified atrial fibrillation  History:        Patient has prior history of Echocardiogram examinations, most                 recent 07/19/2019. CAD; Risk Factors:Hypertension and                 Dyslipidemia.  Sonographer:    Raquel Sarna Senior RDCS Referring Phys: [place]   Sonographer Comments: Difficult parasternals, very medial windows. IMPRESSIONS  1. Left ventricular ejection fraction, by estimation, is 55 to 60%. The left ventricle has normal function. The left ventricle has no regional wall motion abnormalities. Left ventricular diastolic parameters are consistent with Grade I diastolic dysfunction (impaired relaxation).  2. Right ventricular systolic function is normal. The right ventricular size is normal. There is normal pulmonary artery systolic pressure. The estimated right ventricular systolic pressure is 94.4 mmHg.  3. The mitral valve is degenerative. Trivial mitral valve regurgitation. No evidence of mitral stenosis.  4. The aortic valve is grossly normal. Aortic valve regurgitation is not visualized. No aortic stenosis is present.  5. The inferior vena cava is normal in size with greater than 50% respiratory variability, suggesting right atrial pressure of 3 mmHg. FINDINGS  Left Ventricle: Left ventricular ejection fraction, by estimation, is 55 to 60%. The left ventricle has normal function. The left ventricle has no regional wall motion abnormalities.  The left ventricular internal cavity size was normal in size. There is  no left ventricular hypertrophy. Left ventricular diastolic parameters are consistent with Grade I diastolic dysfunction (impaired relaxation). Right Ventricle: The right ventricular size is normal. No increase in right ventricular wall thickness. Right ventricular systolic function is normal. There is normal pulmonary artery systolic pressure. The tricuspid regurgitant velocity is 2.30 m/s, and  with an assumed right atrial pressure of 3 mmHg, the estimated right ventricular systolic pressure is 28.7 mmHg. Left Atrium: Left atrial size was normal in size. Right Atrium: Right atrial size was normal in size. Pericardium: There is no evidence of pericardial effusion. Mitral Valve: The mitral valve is degenerative in appearance. Normal mobility of the  mitral valve leaflets. Mild mitral annular calcification. Trivial mitral valve regurgitation. No evidence of mitral valve stenosis. Tricuspid Valve: The tricuspid valve is normal in structure. Tricuspid valve regurgitation is not demonstrated. No evidence of tricuspid stenosis. Aortic Valve: The aortic valve is grossly normal.. There is moderate thickening of the aortic valve. Aortic valve regurgitation is not visualized. No aortic stenosis is present. There is moderate thickening of the aortic valve. Pulmonic Valve: The pulmonic valve was normal in structure. Pulmonic valve regurgitation is not visualized. No evidence of pulmonic stenosis. Aorta: The aortic root is normal in size and structure. Venous: The inferior vena cava is normal in size with greater than 50% respiratory variability, suggesting right atrial pressure of 3 mmHg. IAS/Shunts: No atrial level shunt detected by color flow Doppler.  LEFT VENTRICLE PLAX 2D LVIDd:         4.50 cm  Diastology LVIDs:         3.10 cm  LV e' lateral:   8.38 cm/s LV PW:         0.70 cm  LV E/e' lateral: 7.6 LV IVS:        1.00 cm  LV e' medial:    7.29 cm/s LVOT diam:     2.00 cm  LV E/e' medial:  8.7 LV SV:         54 LV SV Index:   35 LVOT Area:     3.14 cm  RIGHT VENTRICLE RV S prime:     17.00 cm/s TAPSE (M-mode): 2.0 cm LEFT ATRIUM             Index       RIGHT ATRIUM           Index LA diam:        3.30 cm 2.14 cm/m  RA Area:     14.40 cm LA Vol (A2C):   44.0 ml 28.58 ml/m RA Volume:   32.20 ml  20.91 ml/m LA Vol (A4C):   48.4 ml 31.44 ml/m LA Biplane Vol: 47.0 ml 30.53 ml/m  AORTIC VALVE LVOT Vmax:   84.20 cm/s LVOT Vmean:  57.200 cm/s LVOT VTI:    0.171 m  AORTA Ao Root diam: 2.70 cm MITRAL VALVE               TRICUSPID VALVE MV Area (PHT): 2.91 cm    TR Peak grad:   21.2 mmHg MV Decel Time: 261 msec    TR Vmax:        230.00 cm/s MV E velocity: 63.40 cm/s MV A velocity: 76.30 cm/s  SHUNTS MV E/A ratio:  0.83        Systemic VTI:  0.17 m  Systemic Diam: 2.00 cm Cherlynn Kaiser MD Electronically signed by Cherlynn Kaiser MD Signature Date/Time: 09/23/2019/7:21:29 PM    Final         Scheduled Meds: . amoxicillin-clavulanate  1 tablet Oral Q12H  . apixaban  2.5 mg Oral BID  . aspirin EC  81 mg Oral Daily  . benzonatate  100 mg Oral TID  . chlorpheniramine-HYDROcodone  5 mL Oral Q12H  . diltiazem  180 mg Oral QPM  . feeding supplement (ENSURE ENLIVE)  237 mL Oral BID BM  . fluticasone  2 spray Each Nare BID  . guaiFENesin  600 mg Oral BID  . insulin aspart  0-9 Units Subcutaneous TID WC  . ipratropium-albuterol  3 mL Nebulization BID  . lactulose  20 g Oral Daily  . loratadine  10 mg Oral Daily  . melatonin  10 mg Oral QHS  . mometasone-formoterol  2 puff Inhalation BID  . multivitamin with minerals  1 tablet Oral Daily  . nystatin  5 mL Oral QID  . pantoprazole  40 mg Oral Daily  . polyethylene glycol  17 g Oral Daily  . predniSONE  50 mg Oral Q breakfast  . rosuvastatin  10 mg Oral Q M,W,F  . senna-docusate  1 tablet Oral BID   Continuous Infusions:   LOS: 6 days   Time spent= 35 mins    Vivienne Sangiovanni Arsenio Loader, MD [facility]  If 7PM-7AM, please contact night-coverage  09/25/2019, 8:44 AM

## 2019-09-25 NOTE — Progress Notes (Signed)
PT demonstrated hands on understanding of Flutter device- non PC cough at this time. Has great effort and strong cough.

## 2019-09-25 NOTE — Progress Notes (Signed)
Pts HR has been between 45-50 while asleep since 2300 tonight.

## 2019-09-25 NOTE — Progress Notes (Signed)
Progress Note  Patient Name: [name] Date of Encounter: 09/25/2019  Primary Cardiologist: Sherren Mocha, MD   Subjective   No further atrial fibrillation since yesterday am.  Her Cardizem was increased to 240mg  qpm but now having bradycardia and junctional rhythm. Denies any chest pain or dizziness.   Inpatient Medications    Scheduled Meds: . amoxicillin-clavulanate  1 tablet Oral Q12H  . apixaban  2.5 mg Oral BID  . aspirin EC  81 mg Oral Daily  . benzonatate  100 mg Oral TID  . chlorpheniramine-HYDROcodone  5 mL Oral Q12H  . diltiazem  180 mg Oral Q12H  . feeding supplement (ENSURE ENLIVE)  237 mL Oral BID BM  . fluticasone  2 spray Each Nare BID  . guaiFENesin  600 mg Oral BID  . insulin aspart  0-9 Units Subcutaneous TID WC  . ipratropium-albuterol  3 mL Nebulization BID  . lactulose  20 g Oral Daily  . loratadine  10 mg Oral Daily  . melatonin  10 mg Oral QHS  . mometasone-formoterol  2 puff Inhalation BID  . multivitamin with minerals  1 tablet Oral Daily  . nystatin  5 mL Oral QID  . pantoprazole  40 mg Oral Daily  . polyethylene glycol  17 g Oral Daily  . predniSONE  50 mg Oral Q breakfast  . rosuvastatin  10 mg Oral Q M,W,F  . senna-docusate  1 tablet Oral BID   Continuous Infusions:  PRN Meds: acetaminophen, albuterol, alum & mag hydroxide-simeth, ondansetron (ZOFRAN) IV, traMADol   Vital Signs    Vitals:   09/24/19 1324 09/24/19 1927 09/24/19 1938 09/25/19 0328  BP: 130/71  (!) 123/97 (!) 105/58  Pulse: 94  66 (!) 48  Resp: 16  18 16   Temp: 98.5 F (36.9 C)  98.3 F (36.8 C) 98.1 F (36.7 C)  TempSrc: Oral  Oral Oral  SpO2: 97% 96% 97% 98%  Weight:      Height:        Intake/Output Summary (Last 24 hours) at 09/25/2019 9242 Last data filed at 09/25/2019 0334 Gross per 24 hour  Intake --  Output 1050 ml  Net -1050 ml   Filed Weights   09/18/19 1643  Weight: 53.1 kg    Telemetry    Sinus bradycardia and junctional rhythm -  Personally Reviewed  ECG    No new EKG to review - Personally Reviewed  Physical Exam   GEN: Well nourished, well developed in no acute distress HEENT: Normal NECK: No JVD; No carotid bruits LYMPHATICS: No lymphadenopathy CARDIAC:RRR, no murmurs, rubs, gallops RESPIRATORY:  Scattered rhonchi ABDOMEN: Soft, non-tender, non-distended MUSCULOSKELETAL:  No edema; No deformity  SKIN: Warm and dry NEUROLOGIC:  Alert and oriented x 3 PSYCHIATRIC:  Normal affect    Labs    Chemistry Recent Labs  Lab 09/18/19 1650 09/18/19 1650 09/19/19 1340 09/21/19 0401 09/23/19 0439 09/24/19 0345 09/25/19 0343  NA 133*   < > 138   < > 134* 133* 132*  K 4.7   < > 3.7   < > 4.8 5.1 5.6*  CL 100   < > 101   < > 99 100 98  CO2 23   < > 27   < > 26 26 27   GLUCOSE 161*   < > 129*   < > 172* 178* 130*  BUN 19   < > 16   < > 29* 27* 34*  CREATININE 0.56   < >  0.62   < > 0.68 0.61 0.75  CALCIUM 8.6*   < > 9.1   < > 8.6* 8.4* 8.6*  PROT 6.1*  --  6.6  --   --   --   --   ALBUMIN 3.4*  --  3.5  --   --   --   --   AST 15  --  15  --   --   --   --   ALT 18  --  17  --   --   --   --   ALKPHOS 90  --  90  --   --   --   --   BILITOT 0.7  --  0.5  --   --   --   --   GFRNONAA >60   < > >60   < > >60 >60 >60  GFRAA >60   < > >60   < > >60 >60 >60  ANIONGAP 10   < > 10   < > 9 7 7    < > = values in this interval not displayed.     Hematology Recent Labs  Lab 09/23/19 0439 09/24/19 0345 09/25/19 0343  WBC 17.3* 22.8* 21.0*  RBC 4.28 3.96 4.03  HGB 13.4 12.5 12.5  HCT 41.9 39.7 39.3  MCV 97.9 100.3* 97.5  MCH 31.3 31.6 31.0  MCHC 32.0 31.5 31.8  RDW 16.1* 16.1* 16.4*  PLT 253 271 258    Cardiac EnzymesNo results for input(s): TROPONINI in the last 168 hours. No results for input(s): TROPIPOC in the last 168 hours.   BNPNo results for input(s): BNP, PROBNP in the last 168 hours.   DDimer No results for input(s): DDIMER in the last 168 hours.   Radiology    ECHOCARDIOGRAM  COMPLETE  Result Date: 09/23/2019    ECHOCARDIOGRAM REPORT   Patient Name:   [name] Date of Exam: 09/23/2019 Medical Rec #:  [id number]        Height:       63.0 in Accession #:    [id number]       Weight:       117.0 lb Date of Birth:  [birth year]        BSA:          1.540 m Patient Age:    85 years         BP:           134/60 mmHg Patient Gender: F                HR:           68 bpm. Exam Location:  Inpatient Procedure: 2D Echo, Color Doppler and Cardiac Doppler Indications:    I48.91* Unspecified atrial fibrillation  History:        Patient has prior history of Echocardiogram examinations, most                 recent 07/19/2019. CAD; Risk Factors:Hypertension and                 Dyslipidemia.  Sonographer:    Raquel Sarna Senior RDCS Referring Phys: [place]  Sonographer Comments: Difficult parasternals, very medial windows. IMPRESSIONS  1. Left ventricular ejection fraction, by estimation, is 55 to 60%. The left ventricle has normal function. The left ventricle has no regional wall motion abnormalities. Left ventricular diastolic parameters are consistent with Grade I diastolic dysfunction (impaired relaxation).  2.  Right ventricular systolic function is normal. The right ventricular size is normal. There is normal pulmonary artery systolic pressure. The estimated right ventricular systolic pressure is 44.0 mmHg.  3. The mitral valve is degenerative. Trivial mitral valve regurgitation. No evidence of mitral stenosis.  4. The aortic valve is grossly normal. Aortic valve regurgitation is not visualized. No aortic stenosis is present.  5. The inferior vena cava is normal in size with greater than 50% respiratory variability, suggesting right atrial pressure of 3 mmHg. FINDINGS  Left Ventricle: Left ventricular ejection fraction, by estimation, is 55 to 60%. The left ventricle has normal function. The left ventricle has no regional wall motion abnormalities. The left ventricular internal cavity  size was normal in size. There is  no left ventricular hypertrophy. Left ventricular diastolic parameters are consistent with Grade I diastolic dysfunction (impaired relaxation). Right Ventricle: The right ventricular size is normal. No increase in right ventricular wall thickness. Right ventricular systolic function is normal. There is normal pulmonary artery systolic pressure. The tricuspid regurgitant velocity is 2.30 m/s, and  with an assumed right atrial pressure of 3 mmHg, the estimated right ventricular systolic pressure is 10.2 mmHg. Left Atrium: Left atrial size was normal in size. Right Atrium: Right atrial size was normal in size. Pericardium: There is no evidence of pericardial effusion. Mitral Valve: The mitral valve is degenerative in appearance. Normal mobility of the mitral valve leaflets. Mild mitral annular calcification. Trivial mitral valve regurgitation. No evidence of mitral valve stenosis. Tricuspid Valve: The tricuspid valve is normal in structure. Tricuspid valve regurgitation is not demonstrated. No evidence of tricuspid stenosis. Aortic Valve: The aortic valve is grossly normal.. There is moderate thickening of the aortic valve. Aortic valve regurgitation is not visualized. No aortic stenosis is present. There is moderate thickening of the aortic valve. Pulmonic Valve: The pulmonic valve was normal in structure. Pulmonic valve regurgitation is not visualized. No evidence of pulmonic stenosis. Aorta: The aortic root is normal in size and structure. Venous: The inferior vena cava is normal in size with greater than 50% respiratory variability, suggesting right atrial pressure of 3 mmHg. IAS/Shunts: No atrial level shunt detected by color flow Doppler.  LEFT VENTRICLE PLAX 2D LVIDd:         4.50 cm  Diastology LVIDs:         3.10 cm  LV e' lateral:   8.38 cm/s LV PW:         0.70 cm  LV E/e' lateral: 7.6 LV IVS:        1.00 cm  LV e' medial:    7.29 cm/s LVOT diam:     2.00 cm  LV E/e'  medial:  8.7 LV SV:         54 LV SV Index:   35 LVOT Area:     3.14 cm  RIGHT VENTRICLE RV S prime:     17.00 cm/s TAPSE (M-mode): 2.0 cm LEFT ATRIUM             Index       RIGHT ATRIUM           Index LA diam:        3.30 cm 2.14 cm/m  RA Area:     14.40 cm LA Vol (A2C):   44.0 ml 28.58 ml/m RA Volume:   32.20 ml  20.91 ml/m LA Vol (A4C):   48.4 ml 31.44 ml/m LA Biplane Vol: 47.0 ml 30.53 ml/m  AORTIC VALVE LVOT Vmax:  84.20 cm/s LVOT Vmean:  57.200 cm/s LVOT VTI:    0.171 m  AORTA Ao Root diam: 2.70 cm MITRAL VALVE               TRICUSPID VALVE MV Area (PHT): 2.91 cm    TR Peak grad:   21.2 mmHg MV Decel Time: 261 msec    TR Vmax:        230.00 cm/s MV E velocity: 63.40 cm/s MV A velocity: 76.30 cm/s  SHUNTS MV E/A ratio:  0.83        Systemic VTI:  0.17 m                            Systemic Diam: 2.00 cm Cherlynn Kaiser MD Electronically signed by Cherlynn Kaiser MD Signature Date/Time: 09/23/2019/7:21:29 PM    Final     Cardiac Studies   2D echo 09/2019 IMPRESSIONS   1. Left ventricular ejection fraction, by estimation, is 55 to 60%. The  left ventricle has normal function. The left ventricle has no regional  wall motion abnormalities. Left ventricular diastolic parameters are  consistent with Grade I diastolic  dysfunction (impaired relaxation).  2. Right ventricular systolic function is normal. The right ventricular  size is normal. There is normal pulmonary artery systolic pressure. The  estimated right ventricular systolic pressure is 27.5 mmHg.  3. The mitral valve is degenerative. Trivial mitral valve regurgitation.  No evidence of mitral stenosis.  4. The aortic valve is grossly normal. Aortic valve regurgitation is not  visualized. No aortic stenosis is present.  5. The inferior vena cava is normal in size with greater than 50%  respiratory variability, suggesting right atrial pressure of 3 mmHg.   Patient Profile     85 y.o. female with a hx of CAD, pulmonary  sarcoidosis, HTN, HLD, SVT, bradycardia with surgery and carotid stenosis admitted 09/18/19 with community acquired PNA  who is being seen  for the evaluation of atrial fib at the request of Dr. Posey Pronto.   Assessment & Plan    1. Atrial fib with RVR -likely driven by underlying PNA and hypoxia -Cardizem increased yesterday to 240mg  qpm and now bradycardic and intermittent junctional bradycardia -decrease Cardizem CD to 180mg  daily>>she has had problems with bradycardia in the past and I do not think she will tolerate a higher dose than this -her atrial arrhythmias will likely resolve with treatment of underlying pulmonary issues -CHA2DS2VASC of 5 and she has underlying Pulmonary dz with multiple pulmonary infx so she is at risk for further atrial arrthymias in the future.   -continue Eliquis 2.5mg  BID (age>80 and wt< 60kg)   2. PNA and pulmonary sarcoid -per IM  3. ASCAD - last cath 2017 with 70% LCx and normal FFR -neg nuc in 2020.   -denies any anginal symptoms  -hsTroponin 10.   -continue ASA and statin  4. Hx SVT  -continue Diltiazem  For questions or updates, please contact [place] Please consult [email or web address] for contact info under Cardiology/STEMI.      Signed, Fransico Him, MD  09/25/2019, 8:21 AM

## 2019-09-26 DIAGNOSIS — I4891 Unspecified atrial fibrillation: Secondary | ICD-10-CM

## 2019-09-26 LAB — BASIC METABOLIC PANEL
Anion gap: 7 (ref 5–15)
BUN: 30 mg/dL — ABNORMAL HIGH (ref 8–23)
CO2: 26 mmol/L (ref 22–32)
Calcium: 8.3 mg/dL — ABNORMAL LOW (ref 8.9–10.3)
Chloride: 98 mmol/L (ref 98–111)
Creatinine, Ser: 0.74 mg/dL (ref 0.44–1.00)
GFR calc Af Amer: >60 mL/min (ref >60)
GFR calc non Af Amer: >60 mL/min (ref >60)
Glucose, Bld: 107 mg/dL — ABNORMAL HIGH (ref 70–99)
Potassium: 5.1 mmol/L (ref 3.5–5.1)
Sodium: 131 mmol/L — ABNORMAL LOW (ref 135–145)

## 2019-09-26 LAB — GLUCOSE, CAPILLARY
Glucose-Capillary: 82 mg/dL (ref 70–99)
Glucose-Capillary: 151 mg/dL — ABNORMAL HIGH (ref 70–99)

## 2019-09-26 LAB — CBC
HCT: 38.7 % (ref 36.0–46.0)
Hemoglobin: 12.5 g/dL (ref 12.0–15.0)
MCH: 31.2 pg (ref 26.0–34.0)
MCHC: 32.3 g/dL (ref 30.0–36.0)
MCV: 96.5 fL (ref 80.0–100.0)
Platelets: 234 10*3/uL (ref 150–400)
RBC: 4.01 MIL/uL (ref 3.87–5.11)
RDW: 16.4 % — ABNORMAL HIGH (ref 11.5–15.5)
WBC: 16.5 10*3/uL — ABNORMAL HIGH (ref 4.0–10.5)
nRBC: 0 % (ref 0.0–0.2)

## 2019-09-26 LAB — MAGNESIUM: Magnesium: 2.3 mg/dL (ref 1.7–2.4)

## 2019-09-26 MED ORDER — APIXABAN 2.5 MG PO TABS: 2.5000 mg | ORAL_TABLET | Freq: Two times a day (BID) | ORAL | 0 refills | Status: DC

## 2019-09-26 MED ORDER — PREDNISONE 20 MG PO TABS: ORAL_TABLET | ORAL | 0 refills | Status: DC

## 2019-09-26 NOTE — Progress Notes (Signed)
Progress Note  Patient Name: [name] Date of Encounter: 09/26/2019  Primary Cardiologist: Sherren Mocha, MD   Subjective   Denies any chest pain, SOB or palpitations.  Wants to go home   Inpatient Medications    Scheduled Meds: . apixaban  2.5 mg Oral BID  . aspirin EC  81 mg Oral Daily  . benzonatate  100 mg Oral TID  . chlorpheniramine-HYDROcodone  5 mL Oral Q12H  . diltiazem  180 mg Oral QPM  . feeding supplement (ENSURE ENLIVE)  237 mL Oral BID BM  . fluticasone  2 spray Each Nare BID  . guaiFENesin  600 mg Oral BID  . insulin aspart  0-9 Units Subcutaneous TID WC  . ipratropium  0.5 mg Nebulization BID  . lactulose  20 g Oral Daily  . levalbuterol  1.25 mg Nebulization BID  . loratadine  10 mg Oral Daily  . melatonin  10 mg Oral QHS  . mometasone-formoterol  2 puff Inhalation BID  . multivitamin with minerals  1 tablet Oral Daily  . nystatin  5 mL Oral QID  . pantoprazole  40 mg Oral Daily  . polyethylene glycol  17 g Oral Daily  . predniSONE  50 mg Oral Q breakfast  . rosuvastatin  10 mg Oral Q M,W,F  . senna-docusate  1 tablet Oral BID   Continuous Infusions:  PRN Meds: acetaminophen, alum & mag hydroxide-simeth, ondansetron (ZOFRAN) IV, traMADol   Vital Signs    Vitals:   09/25/19 1341 09/25/19 1946 09/25/19 1959 09/26/19 0345  BP: (!) 128/57 140/64  (!) 130/57  Pulse: 77 83  74  Resp: 16 18  15   Temp: 97.7 F (36.5 C) 98.5 F (36.9 C)  98.8 F (37.1 C)  TempSrc:  Oral  Oral  SpO2: 98% 97% 96% 95%  Weight:      Height:        Intake/Output Summary (Last 24 hours) at 09/26/2019 0911 Last data filed at 09/26/2019 0536 Gross per 24 hour  Intake 240 ml  Output 1850 ml  Net -1610 ml   Filed Weights   09/18/19 1643  Weight: 53.1 kg    Telemetry    NSR - Personally Reviewed  ECG    No new EKG reviewed - Personally Reviewed  Physical Exam   GEN: No acute distress.   Neck: No JVD, no carotid bruits Cardiac:  RRR, no murmurs,  rubs, or gallops.  Respiratory: scattered coarse rhonchi GI: NABS, Soft, nontender, non-distended  MS: No edema; No deformity. Neuro:  Nonfocal, moving all extremities spontaneously Psych: Normal affect   Labs    Chemistry Recent Labs  Lab 09/19/19 1340 09/21/19 0401 09/24/19 0345 09/25/19 0343 09/26/19 0335  NA 138   < > 133* 132* 131*  K 3.7   < > 5.1 5.6* 5.1  CL 101   < > 100 98 98  CO2 27   < > 26 27 26   GLUCOSE 129*   < > 178* 130* 107*  BUN 16   < > 27* 34* 30*  CREATININE 0.62   < > 0.61 0.75 0.74  CALCIUM 9.1   < > 8.4* 8.6* 8.3*  PROT 6.6  --   --   --   --   ALBUMIN 3.5  --   --   --   --   AST 15  --   --   --   --   ALT 17  --   --   --   --  ALKPHOS 90  --   --   --   --   BILITOT 0.5  --   --   --   --   GFRNONAA >60   < > >60 >60 >60  GFRAA >60   < > >60 >60 >60  ANIONGAP 10   < > 7 7 7    < > = values in this interval not displayed.     Hematology Recent Labs  Lab 09/24/19 0345 09/25/19 0343 09/26/19 0335  WBC 22.8* 21.0* 16.5*  RBC 3.96 4.03 4.01  HGB 12.5 12.5 12.5  HCT 39.7 39.3 38.7  MCV 100.3* 97.5 96.5  MCH 31.6 31.0 31.2  MCHC 31.5 31.8 32.3  RDW 16.1* 16.4* 16.4*  PLT 271 258 234    Cardiac EnzymesNo results for input(s): TROPONINI in the last 168 hours. No results for input(s): TROPIPOC in the last 168 hours.   BNPNo results for input(s): BNP, PROBNP in the last 168 hours.   DDimer No results for input(s): DDIMER in the last 168 hours.   Radiology    No results found.  Cardiac Studies   Echocardiogram 09/23/2019: 1. Left ventricular ejection fraction, by estimation, is 55 to 60%. The  left ventricle has normal function. The left ventricle has no regional  wall motion abnormalities. Left ventricular diastolic parameters are  consistent with Grade I diastolic  dysfunction (impaired relaxation).  2. Right ventricular systolic function is normal. The right ventricular  size is normal. There is normal pulmonary artery  systolic pressure. The  estimated right ventricular systolic pressure is 16.1 mmHg.  3. The mitral valve is degenerative. Trivial mitral valve regurgitation.  No evidence of mitral stenosis.  4. The aortic valve is grossly normal. Aortic valve regurgitation is not  visualized. No aortic stenosis is present.  5. The inferior vena cava is normal in size with greater than 50%  respiratory variability, suggesting right atrial pressure of 3 mmHg.   Patient Profile     85 y.o. female with a hx of CAD, pulmonary sarcoidosis, HTN, HLD, SVT, bradycardia with surgery and carotid stenosis admitted 09/18/19 with community acquired PNAwho is being followed by cardiology for the evaluation ofatrial fibrillation  Assessment & Plan    1. Atrial fibrillation with RVR: likely driving by underlying PNA/hypoxia.  -Diltiazem increased to 240mg  daily resulting in bradycardia and intermittent junctional bradycardia.  -Diltiazem decreased to 180mg  daily and HR's have been stable over the past 12 hours, though likely to have recurrent atrial fibrillation in the future given underlying pulmonary disease and high risk for recurrent infections.  - Continue diltiazem 180mg  daily for rate control - Continue eliquis 2.5mg  BID for stroke ppx (dose adjusted for age >28 and weight <60 kg) - will have her followup with Dr. Burt Knack or [name], PA in 1-2 weeks.  May need to consider referral to afib clinic if she continues to have PAF.  Amio would not be a good option given her COPD/pulmonary Sarcoid and she has CAD so no Flecainide.    2. CAD:  -noted to have 70% LCx stenosis with negative FFR on last LHC in 2017. - Last ischemic eval was a NST in 2020 which was without ischemia. No anginal complaints. HsTrop negative this admission - Continue aspirin and statin  3. PNA in patient with pulmonary sarcoidosis: Completed IV>po antibiotics course. Ongoing supportive care with po steroids, nebulizers, and cough  suppressants. Likely driving #1 - Continue management per primary team  4. Hx of SVT: no  issues this admission - Continue diltiazem 180mg  daily   [facility] will sign off.   Medication Recommendations:  Cardizem CD 180mg  daily, Eliquis 2.5mg  BID, Crestor 10mg  3 times weekly, ASA 81mg  daily. Other recommendations (labs, testing, etc):  none Follow up as an outpatient:  Dr. Burt Knack or [name], PA in 1-2 weeks  For questions or updates, please contact [place] Please consult [email or web address] for contact info under Cardiology/STEMI.      Signed, [name], PA-C  09/26/2019, 9:11 AM   [phone]

## 2019-09-26 NOTE — Plan of Care (Signed)
  Problem: Clinical Measurements: Goal: Ability to maintain clinical measurements within normal limits will improve Outcome: Adequate for Discharge Goal: Will remain free from infection Outcome: Adequate for Discharge Goal: Diagnostic test results will improve Outcome: Adequate for Discharge Goal: Respiratory complications will improve Outcome: Adequate for Discharge Goal: Cardiovascular complication will be avoided Outcome: Adequate for Discharge   Problem: Pain Managment: Goal: General experience of comfort will improve Outcome: Adequate for Discharge

## 2019-09-26 NOTE — Discharge Instructions (Signed)
Apixaban oral tablets What is this medicine? APIXABAN (a PIX a ban) is an anticoagulant (blood thinner). It is used to lower the chance of stroke in people with a medical condition called atrial fibrillation. It is also used to treat or prevent blood clots in the lungs or in the veins. This medicine may be used for other purposes; ask your health care provider or pharmacist if you have questions. COMMON BRAND NAME(S): Eliquis What should I tell my health care provider before I take this medicine? They need to know if you have any of these conditions:  antiphospholipid antibody syndrome  bleeding disorders  bleeding in the brain  blood in your stools (black or tarry stools) or if you have blood in your vomit  history of blood clots  history of stomach bleeding  kidney disease  liver disease  mechanical heart valve  an unusual or allergic reaction to apixaban, other medicines, foods, dyes, or preservatives  pregnant or trying to get pregnant  breast-feeding How should I use this medicine? Take this medicine by mouth with a glass of water. Follow the directions on the prescription label. You can take it with or without food. If it upsets your stomach, take it with food. Take your medicine at regular intervals. Do not take it more often than directed. Do not stop taking except on your doctor's advice. Stopping this medicine may increase your risk of a blood clot. Be sure to refill your prescription before you run out of medicine. Talk to your pediatrician regarding the use of this medicine in children. Special care may be needed. Overdosage: If you think you have taken too much of this medicine contact a poison control center or emergency room at once. NOTE: This medicine is only for you. Do not share this medicine with others. What if I miss a dose? If you miss a dose, take it as soon as you can. If it is almost time for your next dose, take only that dose. Do not take double or  extra doses. What may interact with this medicine? This medicine may interact with the following:  aspirin and aspirin-like medicines  certain medicines for fungal infections like ketoconazole and itraconazole  certain medicines for seizures like carbamazepine and phenytoin  certain medicines that treat or prevent blood clots like warfarin, enoxaparin, and dalteparin  clarithromycin  NSAIDs, medicines for pain and inflammation, like ibuprofen or naproxen  rifampin  ritonavir  St. John's wort This list may not describe all possible interactions. Give your health care provider a list of all the medicines, herbs, non-prescription drugs, or dietary supplements you use. Also tell them if you smoke, drink alcohol, or use illegal drugs. Some items may interact with your medicine. What should I watch for while using this medicine? Visit your healthcare professional for regular checks on your progress. You may need blood work done while you are taking this medicine. Your condition will be monitored carefully while you are receiving this medicine. It is important not to miss any appointments. Avoid sports and activities that might cause injury while you are using this medicine. Severe falls or injuries can cause unseen bleeding. Be careful when using sharp tools or knives. Consider using an electric razor. Take special care brushing or flossing your teeth. Report any injuries, bruising, or red spots on the skin to your healthcare professional. If you are going to need surgery or other procedure, tell your healthcare professional that you are taking this medicine. Wear a medical ID bracelet   or chain. Carry a card that describes your disease and details of your medicine and dosage times. What side effects may I notice from receiving this medicine? Side effects that you should report to your doctor or health care professional as soon as possible:  allergic reactions like skin rash, itching or hives,  swelling of the face, lips, or tongue  signs and symptoms of bleeding such as bloody or black, tarry stools; red or dark-brown urine; spitting up blood or brown material that looks like coffee grounds; red spots on the skin; unusual bruising or bleeding from the eye, gums, or nose  signs and symptoms of a blood clot such as chest pain; shortness of breath; pain, swelling, or warmth in the leg  signs and symptoms of a stroke such as changes in vision; confusion; trouble speaking or understanding; severe headaches; sudden numbness or weakness of the face, arm or leg; trouble walking; dizziness; loss of coordination This list may not describe all possible side effects. Call your doctor for medical advice about side effects. You may report side effects to FDA at 1-800-FDA-1088. Where should I keep my medicine? Keep out of the reach of children. Store at room temperature between 20 and 25 degrees C (68 and 77 degrees F). Throw away any unused medicine after the expiration date. NOTE: This sheet is a summary. It may not cover all possible information. If you have questions about this medicine, talk to your doctor, pharmacist, or health care provider.  2020 Elsevier/Gold Standard (2017-10-18 17:39:34)  

## 2019-09-26 NOTE — Discharge Summary (Signed)
Physician Discharge Summary  SHANEL PRAZAK FYB:[id number] DOB: 1934/11/08 DOA: 09/18/2019  PCP: [name], MD  Admit date: 09/18/2019 Discharge date: 09/26/2019  Admitted From: Home Disposition: Home  Recommendations for Outpatient Follow-up:  1. Follow up with PCP in 1-2 weeks 2. Please obtain BMP/CBC in one week your next doctors visit.  3. Start Eliquis 2.5 mg twice daily, 30-day free medication card given 4. Advised to continue using bronchodilators 3-4 times daily at least for the next week 5. Needs outpatient pulmonary follow-up in person in next 2-4 weeks 6. Oral prednisone taper has been prescribed 7. Follow-up outpatient cardiology to be arranged by their service.  Continue taking Cardizem 180 mg daily   Discharge Condition: Stable CODE STATUS: Full code Diet recommendation: Heart healthy  Brief/Interim Summary: 85 yo Past medical history of CAD, PSVT, bronchiectasis, sarcoidosis, HLD. Recently treated for pneumonia outpatient with 7-day treatment course with doxycycline followed by another 7-day treatment course for cefdinir along with steroids. Continues to come to the hospital with worsening shortness of breath and cough.  She was diagnosed with bronchiectasis/community-acquired pneumonia with underlying sarcoidosis flare.  Treated initially with Solu-Medrol and transition to p.o. prednisone along with aggressive bronchodilators.  Completed 7 days of oral Augmentin.  For new onset A. fib with RVR she was seen by cardiology.  Initially attempted to uptitrate Cardizem but she became bradycardic.  She remained stable on Cardizem 180 mg daily, TSH was normal.  Started on Eliquis.  She is stable for discharge today with outpatient follow-up recommendations as stated above.  Also spoke with the patient's daughter over the phone on the day of discharge.    Assessment & Plan:   Principal Problem:   Pneumonia Active Problems:   HLD (hyperlipidemia)   Paroxysmal  supraventricular tachycardia (HCC)   GERD   CAD (coronary artery disease)   Aspiration pneumonia (HCC)   Community-acquired pneumonia. Bronchiectasis flareup secondary to the pneumonia. Sarcoidosis history. -CTA chest negative for PE, persistent bronchiectasis with concerns of bronchopneumonia.  Elevated procalcitonin, CRP -Continue using supportive care including antitussive, I-S and flutter valve at home -Continue to use scheduled bronchodilators at home over the next week and then transition to as needed -Prednisone taper prescribed. -Completed antibiotic course.  New onset paroxysmal A. fib with RVR.  Improved -Echocardiogram preserved EF with grade 1 DD -Seen by cardiology.  Continue Cardizem 180 mg daily.  Any further increase in dose causes bradycardia. -Eliquis 2.5 mg twice daily.  Hyperglycemia secondary to steroid use -A1c 5.8, continue to monitor  Coronary artery disease without chest pain -Continue aspirin and statin  Intermittent PSVT, essential hypertension Continue Cardizem.  Hyperlipidemia -On Crestor  GERD -PPI daily  Oral thrush -On nystatin  PT recommending home health  Body mass index is 20.73 kg/m.         Discharge Diagnoses:  Principal Problem:   Pneumonia Active Problems:   HLD (hyperlipidemia)   Paroxysmal supraventricular tachycardia (HCC)   GERD   CAD (coronary artery disease)   Aspiration pneumonia (HCC)   Atrial fibrillation with RVR [facility])      Consultations:  Cardiology  Subjective: Feels great no complaints wants to go home.  Discharge Exam: Vitals:   09/26/19 0345 09/26/19 1037  BP: (!) 130/57   Pulse: 74   Resp: 15   Temp: 98.8 F (37.1 C)   SpO2: 95% 96%   Vitals:   09/25/19 1946 09/25/19 1959 09/26/19 0345 09/26/19 1037  BP: 140/64  (!) 130/57   Pulse: 83  74   Resp: 18  15   Temp: 98.5 F (36.9 C)  98.8 F (37.1 C)   TempSrc: Oral  Oral   SpO2: 97% 96% 95% 96%  Weight:       Height:        General: Pt is alert, awake, not in acute distress Cardiovascular: RRR, S1/S2 +, no rubs, no gallops Respiratory: CTA bilaterally, no wheezing, no rhonchi Abdominal: Soft, NT, ND, bowel sounds + Extremities: no edema, no cyanosis  Discharge Instructions   Allergies as of 09/26/2019      Reactions   Levaquin [levofloxacin] Other (See Comments)   Leg pain .Marland Kitchen Per doctor not to take again   Sulfonamide Derivatives    Pt unsure   Oxycodone Nausea Only   Can take oxycodone with Zofran   Morphine Nausea Only      Medication List    STOP taking these medications   cefdinir 300 MG capsule Commonly known as: OMNICEF   doxycycline 100 MG capsule Commonly known as: MONODOX   ipratropium 0.03 % nasal spray Commonly known as: ATROVENT   potassium chloride 10 MEQ tablet Commonly known as: KLOR-CON     TAKE these medications   acetaminophen 500 MG tablet Commonly known as: TYLENOL Take 1,000 mg by mouth every 6 (six) hours as needed for mild pain.   albuterol 108 (90 Base) MCG/ACT inhaler Commonly known as: VENTOLIN HFA Inhale 2 puffs into the lungs every 4 (four) hours as needed for wheezing or shortness of breath.   apixaban 2.5 MG Tabs tablet Commonly known as: ELIQUIS Take 1 tablet (2.5 mg total) by mouth 2 (two) times daily.   aspirin EC 81 MG tablet Take 1 tablet (81 mg total) by mouth daily.   benzonatate 100 MG capsule Commonly known as: TESSALON Take 1 capsule (100 mg total) by mouth every 6 (six) hours as needed for cough.   calcium carbonate 500 MG chewable tablet Commonly known as: TUMS - dosed in mg elemental calcium Chew 1 tablet by mouth daily as needed for indigestion.   cetirizine 10 MG tablet Commonly known as: ZYRTEC Take 10 mg by mouth daily.   Delsym 30 MG/5ML liquid Generic drug: dextromethorphan Take 30 mg by mouth 2 (two) times daily as needed for cough.   denosumab 60 MG/ML Soln injection Commonly known as: PROLIA Inject  60 mg into the skin every 6 (six) months. Administer in upper arm, thigh, or abdomen   diltiazem 180 MG 24 hr capsule Commonly known as: CARDIZEM CD Take 180 mg by mouth at bedtime.   docusate sodium 50 MG capsule Commonly known as: COLACE Take 100 mg by mouth at bedtime.   fluticasone 50 MCG/ACT nasal spray Commonly known as: FLONASE Place 2 sprays into both nostrils 2 (two) times daily.   furosemide 20 MG tablet Commonly known as: LASIX Take 1 tablet (20 mg total) by mouth daily.   guaiFENesin 600 MG 12 hr tablet Commonly known as: MUCINEX Take 600 mg by mouth 2 (two) times daily.   ipratropium 17 MCG/ACT inhaler Commonly known as: ATROVENT HFA Inhale 1 puff into the lungs 2 (two) times daily.   melatonin 5 MG Tabs Take 10 mg by mouth at bedtime.   mometasone-formoterol 200-5 MCG/ACT Aero Commonly known as: DULERA Inhale 2 puffs into the lungs 2 (two) times daily.   nitroGLYCERIN 0.4 MG SL tablet Commonly known as: NITROSTAT Place 1 tablet (0.4 mg total) under the tongue every 5 (five) minutes as needed  for chest pain.   omeprazole 40 MG capsule Commonly known as: PRILOSEC Take 40 mg by mouth 2 (two) times daily.   ondansetron 4 MG disintegrating tablet Commonly known as: ZOFRAN-ODT Take 4 mg by mouth every 4 (four) hours as needed for nausea or vomiting.   ondansetron 4 MG tablet Commonly known as: Zofran Take 1 tablet (4 mg total) by mouth every 8 (eight) hours as needed for nausea or vomiting (take along with pain med).   oxyCODONE-acetaminophen 5-325 MG tablet Commonly known as: Percocet Take 1 tablet by mouth every 4 (four) hours as needed (max 6 q for post op pain).   polyethylene glycol 17 g packet Commonly known as: MIRALAX / GLYCOLAX Take 17 g by mouth daily.   predniSONE 20 MG tablet Commonly known as: DELTASONE Take 2 tablets (40 mg total) by mouth daily with breakfast for 2 days, THEN 1.5 tablets (30 mg total) daily with breakfast for 2 days,  THEN 1 tablet (20 mg total) daily with breakfast for 2 days. Start taking on: September 27, 2019 What changed:   See the new instructions.  Another medication with the same name was removed. Continue taking this medication, and follow the directions you see here.   rosuvastatin 10 MG tablet Commonly known as: CRESTOR Take 10 mg by mouth every Monday, Wednesday, and Friday.   saccharomyces boulardii 250 MG capsule Commonly known as: FLORASTOR Take 1 capsule (250 mg total) by mouth 2 (two) times daily.   Vitamin D (Ergocalciferol) 1.25 MG (50000 UNIT) Caps capsule Commonly known as: DRISDOL Take 50,000 Units by mouth every Friday.       Follow-up Information    [facility] Follow up.   Specialty: Home Health Services Contact information: [place] [address] [phone]        [name], MD. Go to.   Specialty: Internal Medicine Why: I left a message for your Dr office to call you back with a hospital follow up appointment Contact information: [place] [address] [phone]        Collene Gobble, MD. Schedule an appointment as soon as possible for a visit in 2 week(s).   Specialty: Pulmonary Disease Why: Hospital follow up Appt, Need to be seen in person.  Contact information: [place] [address] [phone]        [name] T, PA-C Follow up on 10/15/2019.   Specialties: Cardiology, Physician Assistant Why: Please arrive 15 minutes early for your 2:15pm post-hospital cardiology follow-up appointment Contact information: [address] [place] [address] [phone]              Allergies  Allergen Reactions  . Levaquin [Levofloxacin] Other (See Comments)    Leg pain .Marland Kitchen Per doctor not to take again  . Sulfonamide Derivatives     Pt unsure  . Oxycodone Nausea Only    Can take oxycodone with Zofran  . Morphine Nausea Only     You were cared for by a hospitalist during your hospital stay. If you have any questions about your discharge medications or the care you received while you were in the hospital after you are discharged, you can call the unit and asked to speak with the hospitalist on call if the hospitalist that took care of you is not available. Once you are discharged, your primary care physician will handle any further medical issues. Please note that no  refills for any discharge medications will be authorized once you are discharged, as it is imperative that you return to your primary care physician (or establish a relationship with a primary care physician if you do not have one) for your aftercare needs so that they can reassess your need for medications and monitor your lab values.   Procedures/Studies: CT Angio Chest PE W/Cm &/Or Wo Cm  Result Date: 09/18/2019 CLINICAL DATA:  85 year old female with chest pain and shortness of breath. Tested negative for COVID-19 2 days ago. EXAM: CT ANGIOGRAPHY CHEST WITH CONTRAST TECHNIQUE: Multidetector CT imaging of the chest was performed using the standard protocol during bolus administration of intravenous contrast. Multiplanar CT image reconstructions and MIPs were obtained to evaluate the vascular anatomy. CONTRAST:  169mL OMNIPAQUE IOHEXOL 350 MG/ML SOLN COMPARISON:  Portable chest earlier today. CT Abdomen and Pelvis 07/29/2019. High-resolution chest CT 01/22/2019 FINDINGS: Cardiovascular: Good contrast bolus timing in the pulmonary arterial tree. Respiratory motion. The central and proximal bilateral middle and lower lobe pulmonary arteries are best evaluated and appear patent with no filling defect identified. The upper lobe and distal lung base vessels are obscured by motion. No cardiomegaly or pericardial effusion. Negative visible aorta aside from atherosclerosis. Calcified coronary artery atherosclerosis is evident on series 4, image 79. Mediastinum/Nodes:  Negative. No mediastinal lymphadenopathy. Small calcified right paratracheal lymph node is stable. Lungs/Pleura: Small to moderate layering left pleural effusion has resolved since last month. However there is a new small area of left lower lobe lateral basal segment peribronchial opacity Allowing for motion artifact the major airways are patent. There is chronic scarring in the right upper lobe. New confluent peribronchial opacity in the posterior basal segment of the right lower lobe with both solid and sub solid areas of involvement. Stable right middle lobe bronchiectasis. Chronically large lung volumes. Upper Abdomen: Negative visible upper abdomen today. Chronic small gastric hiatal hernia. Of note, the visible left kidney has normalized since the appearance on 07/29/2019 (please see that report). Musculoskeletal: Chronic T11 compression fracture. Exaggerated thoracic kyphosis. Osteopenia. Left shoulder arthroplasty. Chronic right 5th rib fractures. Chronic left 11th rib fractures. No acute osseous abnormality identified. Review of the MIP images confirms the above findings. IMPRESSION: 1. Motion degraded exam. No central or proximal lower lung pulmonary embolus, but other branches - especially in the upper lobes - are obscured. 2. Chronic pulmonary hyperinflation, right middle lobe bronchiectasis. Confluent peribronchial opacity in the lower lobes greater on the right most suggestive of Bronchopneumonia. However, left pleural effusion seen last month has resolved. 3. Aortic Atherosclerosis (ICD10-I70.0). Electronically Signed   By: Genevie Ann M.D.   On: 09/18/2019 20:48   DG Chest Port 1 View  Result Date: 09/18/2019 CLINICAL DATA:  85 year old female with shortness of breath and cough. EXAM: PORTABLE CHEST 1 VIEW COMPARISON:  Chest radiograph dated 07/29/2019. FINDINGS: Background of emphysema. Probable small right pleural effusion with minimal right lung base atelectasis. No focal consolidation, pleural  effusion, or pneumothorax. The cardiac silhouette is within limits. Atherosclerotic calcification of the aorta. Calcified mediastinal granuloma. Osteopenia with degenerative changes of the spine. Left shoulder arthroplasty. No acute osseous pathology. IMPRESSION: Probable small right pleural effusion with minimal right lung base atelectasis. Electronically Signed   By: Anner Crete M.D.   On: 09/18/2019 17:29   ECHOCARDIOGRAM COMPLETE  Result Date: 09/23/2019    ECHOCARDIOGRAM REPORT   Patient Name:   ADNA NOFZIGER Date of Exam: 09/23/2019 Medical Rec #:  [id number]  Height:       63.0 in Accession #:    [id number]       Weight:       117.0 lb Date of Birth:  1934-09-27        BSA:          1.540 m Patient Age:    65 years         BP:           134/60 mmHg Patient Gender: F                HR:           68 bpm. Exam Location:  Inpatient Procedure: 2D Echo, Color Doppler and Cardiac Doppler Indications:    I48.91* Unspecified atrial fibrillation  History:        Patient has prior history of Echocardiogram examinations, most                 recent 07/19/2019. CAD; Risk Factors:Hypertension and                 Dyslipidemia.  Sonographer:    Raquel Sarna Senior RDCS Referring Phys: [place]  Sonographer Comments: Difficult parasternals, very medial windows. IMPRESSIONS  1. Left ventricular ejection fraction, by estimation, is 55 to 60%. The left ventricle has normal function. The left ventricle has no regional wall motion abnormalities. Left ventricular diastolic parameters are consistent with Grade I diastolic dysfunction (impaired relaxation).  2. Right ventricular systolic function is normal. The right ventricular size is normal. There is normal pulmonary artery systolic pressure. The estimated right ventricular systolic pressure is 05.3 mmHg.  3. The mitral valve is degenerative. Trivial mitral valve regurgitation. No evidence of mitral stenosis.  4. The aortic valve is grossly normal. Aortic  valve regurgitation is not visualized. No aortic stenosis is present.  5. The inferior vena cava is normal in size with greater than 50% respiratory variability, suggesting right atrial pressure of 3 mmHg. FINDINGS  Left Ventricle: Left ventricular ejection fraction, by estimation, is 55 to 60%. The left ventricle has normal function. The left ventricle has no regional wall motion abnormalities. The left ventricular internal cavity size was normal in size. There is  no left ventricular hypertrophy. Left ventricular diastolic parameters are consistent with Grade I diastolic dysfunction (impaired relaxation). Right Ventricle: The right ventricular size is normal. No increase in right ventricular wall thickness. Right ventricular systolic function is normal. There is normal pulmonary artery systolic pressure. The tricuspid regurgitant velocity is 2.30 m/s, and  with an assumed right atrial pressure of 3 mmHg, the estimated right ventricular systolic pressure is 97.6 mmHg. Left Atrium: Left atrial size was normal in size. Right Atrium: Right atrial size was normal in size. Pericardium: There is no evidence of pericardial effusion. Mitral Valve: The mitral valve is degenerative in appearance. Normal mobility of the mitral valve leaflets. Mild mitral annular calcification. Trivial mitral valve regurgitation. No evidence of mitral valve stenosis. Tricuspid Valve: The tricuspid valve is normal in structure. Tricuspid valve regurgitation is not demonstrated. No evidence of tricuspid stenosis. Aortic Valve: The aortic valve is grossly normal.. There is moderate thickening of the aortic valve. Aortic valve regurgitation is not visualized. No aortic stenosis is present. There is moderate thickening of the aortic valve. Pulmonic Valve: The pulmonic valve was normal in structure. Pulmonic valve regurgitation is not visualized. No evidence of pulmonic stenosis. Aorta: The aortic root is normal in size and structure. Venous: The  inferior  vena cava is normal in size with greater than 50% respiratory variability, suggesting right atrial pressure of 3 mmHg. IAS/Shunts: No atrial level shunt detected by color flow Doppler.  LEFT VENTRICLE PLAX 2D LVIDd:         4.50 cm  Diastology LVIDs:         3.10 cm  LV e' lateral:   8.38 cm/s LV PW:         0.70 cm  LV E/e' lateral: 7.6 LV IVS:        1.00 cm  LV e' medial:    7.29 cm/s LVOT diam:     2.00 cm  LV E/e' medial:  8.7 LV SV:         54 LV SV Index:   35 LVOT Area:     3.14 cm  RIGHT VENTRICLE RV S prime:     17.00 cm/s TAPSE (M-mode): 2.0 cm LEFT ATRIUM             Index       RIGHT ATRIUM           Index LA diam:        3.30 cm 2.14 cm/m  RA Area:     14.40 cm LA Vol (A2C):   44.0 ml 28.58 ml/m RA Volume:   32.20 ml  20.91 ml/m LA Vol (A4C):   48.4 ml 31.44 ml/m LA Biplane Vol: 47.0 ml 30.53 ml/m  AORTIC VALVE LVOT Vmax:   84.20 cm/s LVOT Vmean:  57.200 cm/s LVOT VTI:    0.171 m  AORTA Ao Root diam: 2.70 cm MITRAL VALVE               TRICUSPID VALVE MV Area (PHT): 2.91 cm    TR Peak grad:   21.2 mmHg MV Decel Time: 261 msec    TR Vmax:        230.00 cm/s MV E velocity: 63.40 cm/s MV A velocity: 76.30 cm/s  SHUNTS MV E/A ratio:  0.83        Systemic VTI:  0.17 m                            Systemic Diam: 2.00 cm Cherlynn Kaiser MD Electronically signed by Cherlynn Kaiser MD Signature Date/Time: 09/23/2019/7:21:29 PM    Final       The results of significant diagnostics from this hospitalization (including imaging, microbiology, ancillary and laboratory) are listed below for reference.     Microbiology: Recent Results (from the past 240 hour(s))  Blood Culture (routine x 2)     Status: None   Collection Time: 09/18/19  4:50 PM   Specimen: BLOOD RIGHT HAND  Result Value Ref Range Status   Specimen Description   Final    BLOOD RIGHT HAND Performed at [facility], [place] [address], [place], [place] [address]    Special Requests   Final    BOTTLES DRAWN  AEROBIC AND ANAEROBIC Blood Culture results may not be optimal due to an inadequate volume of blood received in culture bottles Performed at [facility] [address], [place], [place] [address]    Culture   Final    NO GROWTH 5 DAYS Performed at [facility], [place] [address] [place], [place] [address]    Report Status 09/23/2019 FINAL  Final  Blood Culture (routine x 2)     Status: None   Collection Time:  09/18/19  4:50 PM   Specimen: BLOOD LEFT FOREARM  Result Value Ref Range Status   Specimen Description   Final    BLOOD LEFT FOREARM Performed at [facility] [address] [place], [place] [address]    Special Requests   Final    BOTTLES DRAWN AEROBIC AND ANAEROBIC Blood Culture adequate volume Performed at [facility] [address], [place], [place] [address]    Culture   Final    NO GROWTH 5 DAYS Performed at [facility], [place] [address] [place], [place] [address]    Report Status 09/23/2019 FINAL  Final  SARS Coronavirus 2 by RT PCR (hospital order, performed in [facility] hospital lab) Nasopharyngeal Nasopharyngeal Swab     Status: None   Collection Time: 09/18/19  5:01 PM   Specimen: Nasopharyngeal Swab  Result Value Ref Range Status   SARS Coronavirus 2 NEGATIVE NEGATIVE Final    Comment: (NOTE) SARS-CoV-2 target nucleic acids are NOT DETECTED.  The SARS-CoV-2 RNA is generally detectable in upper and lower respiratory specimens during the acute phase of infection. The lowest concentration of SARS-CoV-2 viral copies this assay can detect is 250 copies / mL. A negative result does not preclude SARS-CoV-2 infection and should not be used as the sole basis for treatment or other patient management decisions.  A negative result may occur with improper specimen collection / handling, submission of specimen other than nasopharyngeal swab, presence of viral mutation(s) within the areas targeted  by this assay, and inadequate number of viral copies (<250 copies / mL). A negative result must be combined with clinical observations, patient history, and epidemiological information.  Fact Sheet for Patients:   StrictlyIdeas.no  Fact Sheet for Healthcare Providers: BankingDealers.co.za  This test is not yet approved or  cleared by the [place] FDA and has been authorized for detection and/or diagnosis of SARS-CoV-2 by FDA under an Emergency Use Authorization (EUA).  This EUA will remain in effect (meaning this test can be used) for the duration of the COVID-19 declaration under Section 564(b)(1) of the Act, 21 U.S.C. section 360bbb-3(b)(1), unless the authorization is terminated or revoked sooner.  Performed at [facility], [place] [address] [place], [place] [address]   Urine culture     Status: Abnormal   Collection Time: 09/18/19  9:00 PM   Specimen: In/Out Cath Urine  Result Value Ref Range Status   Specimen Description   Final    IN/OUT CATH URINE Performed at [facility] [address], [place], [place] [address]    Special Requests   Final    NONE Performed at [facility], [place] [address] [place], [place] [address]    Culture (A)  Final    <10,000 COLONIES/mL INSIGNIFICANT GROWTH Performed at [facility] [address] [place], [place] [address]    Report Status 09/19/2019 FINAL  Final  Expectorated sputum assessment w rflx to resp cult     Status: None   Collection Time: 09/20/19 10:50 AM   Specimen: Expectorated Sputum  Result Value Ref Range Status   Specimen Description EXPECTORATED SPUTUM  Final   Special Requests NONE  Final   Sputum evaluation   Final    Sputum specimen not acceptable for testing.  Please recollect.   INFORMED PEINSON,L. RN @1155  ON 7.30.2021 BY [facility] Performed at [facility], [place] [address]  [place],  [address]    Report  Status 09/20/2019 FINAL  Final     Labs: BNP (last 3 results) Recent Labs    07/20/19 1413  BNP 347.4*   Basic Metabolic Panel: Recent Labs  Lab 09/19/19 1340 09/19/19 1340 09/21/19 0401 09/21/19 0401 09/22/19 0427 09/23/19 0439 09/24/19 0345 09/25/19 0343 09/26/19 0335  NA 138   < > 137   < > 134* 134* 133* 132* 131*  K 3.7   < > 4.2   < > 4.5 4.8 5.1 5.6* 5.1  CL 101   < > 102   < > 101 99 100 98 98  CO2 27   < > 22   < > 26 26 26 27 26   GLUCOSE 129*   < > 190*   < > 112* 172* 178* 130* 107*  BUN 16   < > 16   < > 26* 29* 27* 34* 30*  CREATININE 0.62   < > 0.53   < > 0.61 0.68 0.61 0.75 0.74  CALCIUM 9.1   < > 8.7*   < > 8.7* 8.6* 8.4* 8.6* 8.3*  MG 2.2  --  2.0  --   --   --   --   --  2.3   < > = values in this interval not displayed.   Liver Function Tests: Recent Labs  Lab 09/19/19 1340  AST 15  ALT 17  ALKPHOS 90  BILITOT 0.5  PROT 6.6  ALBUMIN 3.5   No results for input(s): LIPASE, AMYLASE in the last 168 hours. No results for input(s): AMMONIA in the last 168 hours. CBC: Recent Labs  Lab 09/19/19 1340 09/21/19 0401 09/22/19 0427 09/23/19 0439 09/24/19 0345 09/25/19 0343 09/26/19 0335  WBC 17.3*   < > 20.1* 17.3* 22.8* 21.0* 16.5*  NEUTROABS 15.0*  --   --   --   --   --   --   HGB 13.3   < > 13.3 13.4 12.5 12.5 12.5  HCT 41.0   < > 41.4 41.9 39.7 39.3 38.7  MCV 97.4   < > 97.4 97.9 100.3* 97.5 96.5  PLT 251   < > 298 253 271 258 234   < > = values in this interval not displayed.   Cardiac Enzymes: No results for input(s): CKTOTAL, CKMB, CKMBINDEX, TROPONINI in the last 168 hours. BNP: Invalid input(s): POCBNP CBG: Recent Labs  Lab 09/25/19 1159 09/25/19 1642 09/25/19 2135 09/26/19 0742 09/26/19 1140  GLUCAP 90 336* 186* 82 151*   D-Dimer No results for input(s): DDIMER in the last 72 hours. Hgb A1c No results for input(s): HGBA1C in the last 72 hours. Lipid Profile No results for input(s):  CHOL, HDL, LDLCALC, TRIG, CHOLHDL, LDLDIRECT in the last 72 hours. Thyroid function studies No results for input(s): TSH, T4TOTAL, T3FREE, THYROIDAB in the last 72 hours.  Invalid input(s): FREET3 Anemia work up No results for input(s): VITAMINB12, FOLATE, FERRITIN, TIBC, IRON, RETICCTPCT in the last 72 hours. Urinalysis    Component Value Date/Time   COLORURINE STRAW (A) 09/18/2019 2100   APPEARANCEUR CLEAR 09/18/2019 2100   LABSPEC 1.034 (H) 09/18/2019 2100   PHURINE 7.0 09/18/2019 2100   GLUCOSEU NEGATIVE 09/18/2019 2100   HGBUR NEGATIVE 09/18/2019 2100   BILIRUBINUR NEGATIVE 09/18/2019 2100   BILIRUBINUR negative 03/21/2018 [place] 09/18/2019 2100   PROTEINUR NEGATIVE 09/18/2019 2100   UROBILINOGEN negative (A) 03/21/2018 1328   UROBILINOGEN 0.2 12/08/2008 1204   NITRITE NEGATIVE 09/18/2019 2100   LEUKOCYTESUR NEGATIVE  09/18/2019 2100   Sepsis Labs Invalid input(s): PROCALCITONIN,  WBC,  LACTICIDVEN Microbiology Recent Results (from the past 240 hour(s))  Blood Culture (routine x 2)     Status: None   Collection Time: 09/18/19  4:50 PM   Specimen: BLOOD RIGHT HAND  Result Value Ref Range Status   Specimen Description   Final    BLOOD RIGHT HAND Performed at [facility], [place] [address] [place], [place] [address]    Special Requests   Final    BOTTLES DRAWN AEROBIC AND ANAEROBIC Blood Culture results may not be optimal due to an inadequate volume of blood received in culture bottles Performed at [place] [address] [place], [place] [address]    Culture   Final    NO GROWTH 5 DAYS Performed at [facility], [place] [address], [place], [place] [address]    Report Status 09/23/2019 FINAL  Final  Blood Culture (routine x 2)     Status: None   Collection Time: 09/18/19  4:50 PM   Specimen: BLOOD LEFT FOREARM  Result Value Ref Range Status   Specimen Description   Final    BLOOD LEFT  FOREARM Performed at [place] [address] [place], [place] [address]    Special Requests   Final    BOTTLES DRAWN AEROBIC AND ANAEROBIC Blood Culture adequate volume Performed at [place] [address], [place], [place] [address]    Culture   Final    NO GROWTH 5 DAYS Performed at [facility], [place] [address] [place], [place] [address]    Report Status 09/23/2019 FINAL  Final  SARS Coronavirus 2 by RT PCR (hospital order, performed in [facility] hospital lab) Nasopharyngeal Nasopharyngeal Swab     Status: None   Collection Time: 09/18/19  5:01 PM   Specimen: Nasopharyngeal Swab  Result Value Ref Range Status   SARS Coronavirus 2 NEGATIVE NEGATIVE Final    Comment: (NOTE) SARS-CoV-2 target nucleic acids are NOT DETECTED.  The SARS-CoV-2 RNA is generally detectable in upper and lower respiratory specimens during the acute phase of infection. The lowest concentration of SARS-CoV-2 viral copies this assay can detect is 250 copies / mL. A negative result does not preclude SARS-CoV-2 infection and should not be used as the sole basis for treatment or other patient management decisions.  A negative result may occur with improper specimen collection / handling, submission of specimen other than nasopharyngeal swab, presence of viral mutation(s) within the areas targeted by this assay, and inadequate number of viral copies (<250 copies / mL). A negative result must be combined with clinical observations, patient history, and epidemiological information.  Fact Sheet for Patients:   StrictlyIdeas.no  Fact Sheet for Healthcare Providers: BankingDealers.co.za  This test is not yet approved or  cleared by the [place] FDA and has been authorized for detection and/or diagnosis of SARS-CoV-2 by FDA under an Emergency Use Authorization (EUA).  This EUA will remain in effect (meaning  this test can be used) for the duration of the COVID-19 declaration under Section 564(b)(1) of the Act, 21 U.S.C. section 360bbb-3(b)(1), unless the authorization is terminated or revoked sooner.  Performed at [facility], [place] [address], [place],  [address]   Urine culture     Status: Abnormal   Collection Time: 09/18/19  9:00 PM   Specimen: In/Out Cath Urine  Result Value Ref Range Status   Specimen  Description   Final    IN/OUT CATH URINE Performed at [place] [address], [place], [place] [address]    Special Requests   Final    NONE Performed at [facility], [place] [address] [place], [place] [address]    Culture (A)  Final    <10,000 COLONIES/mL INSIGNIFICANT GROWTH Performed at [place] [address] [place], [place] [address]    Report Status 09/19/2019 FINAL  Final  Expectorated sputum assessment w rflx to resp cult     Status: None   Collection Time: 09/20/19 10:50 AM   Specimen: Expectorated Sputum  Result Value Ref Range Status   Specimen Description EXPECTORATED SPUTUM  Final   Special Requests NONE  Final   Sputum evaluation   Final    Sputum specimen not acceptable for testing.  Please recollect.   INFORMED PEINSON,L. RN @1155  ON 7.30.2021 BY [facility] Performed at [facility], [place] [address] [place], [place] [address]    Report Status 09/20/2019 FINAL  Final     Time coordinating discharge:  I have spent 35 minutes face to face with the patient and on the ward discussing the patients care, assessment, plan and disposition with other care givers. >50% of the time was devoted counseling the patient about the risks and benefits of treatment/Discharge disposition and coordinating care.   SIGNED:   Damita Lack, MD  [facility] 09/26/2019, 12:35 PM   If 7PM-7AM, please contact night-coverage

## 2019-10-03 ENCOUNTER — Encounter: Payer: Self-pay | Admitting: Emergency Medicine

## 2019-10-03 ENCOUNTER — Ambulatory Visit (INDEPENDENT_AMBULATORY_CARE_PROVIDER_SITE_OTHER): Payer: Medicare Other | Admitting: Emergency Medicine

## 2019-10-03 ENCOUNTER — Other Ambulatory Visit: Payer: Self-pay

## 2019-10-03 DIAGNOSIS — I2510 Atherosclerotic heart disease of native coronary artery without angina pectoris: Secondary | ICD-10-CM | POA: Diagnosis not present

## 2019-10-03 DIAGNOSIS — J301 Allergic rhinitis due to pollen: Secondary | ICD-10-CM | POA: Diagnosis not present

## 2019-10-03 DIAGNOSIS — D869 Sarcoidosis, unspecified: Secondary | ICD-10-CM | POA: Diagnosis not present

## 2019-10-03 DIAGNOSIS — J189 Pneumonia, unspecified organism: Secondary | ICD-10-CM | POA: Diagnosis not present

## 2019-10-03 DIAGNOSIS — J479 Bronchiectasis, uncomplicated: Secondary | ICD-10-CM

## 2019-10-03 MED ORDER — MOMETASONE FURO-FORMOTEROL FUM 200-5 MCG/ACT IN AERO: 2.0000 | INHALATION_SPRAY | Freq: Two times a day (BID) | RESPIRATORY_TRACT | 3 refills | Status: DC

## 2019-10-03 NOTE — Assessment & Plan Note (Signed)
  Continue cetirizine once a day Continue fluticasone nasal spray 2 sprays each nostril once daily Continue Mucinex 600 mg twice a day Use your flutter valve twice a day to help you clear mucus COVID19 vaccine up to date We will repeat your CT scan of the chest without contrast in mid September to follow pneumonia and bronchiectasis Follow with Dr. [name] in September after your CT scan to review the results together

## 2019-10-03 NOTE — Patient Instructions (Addendum)
Continue Dulera 2 puffs twice a day.  Rinse and gargle after using. Keep your albuterol available use 2 puffs if needed for shortness of breath, chest tightness, wheezing. Continue cetirizine once a day Continue fluticasone nasal spray 2 sprays each nostril once daily Continue Mucinex 600 mg twice a day Continue omeprazole 40 mg twice a day Use your flutter valve twice a day to help you clear mucus COVID19 vaccine up to date We will repeat your CT scan of the chest without contrast in mid September to follow pneumonia and bronchiectasis Follow with Dr. [name] in September after your CT scan to review the results together

## 2019-10-03 NOTE — Assessment & Plan Note (Signed)
Push flutter valve, secretion clearance Following repeat CT in September

## 2019-10-03 NOTE — Progress Notes (Signed)
Subjective:    Patient ID: [name], female    DOB: May 29, 1934, 85 y.o.   MRN: [id number]  HPI 85 yo woman, never smoker, hx sarcoidosis that was dx by Dr Lucia Gaskins in [place] after FOB (? Whether she had biopsies or not), PSVT, GERD, chronic rhinitis/allergies. Severe obstruction identified by pulmonary function testing 2015. She has had pneumonias in the past, left with some parenchymal distortion and bronchiectasis, including in October 2020 and this hospitalization from 07/28-08/06/2019. She was found to have atrial fibrillation with RVR during that hospitalization as well. Seemed to start as sinus drainage and pain.  She was treated with antibiotics as an outpt prior to the admission, then abx and corticosteroids inpt.  Currently managed on Dulera, has albuterol that she uses approximately few times a week. She believes the [facility] helps her  Lots of persistent nasal gtt, on zyrtec, flonase reliably, on mucinex bid.  No aspiration sx.  She has a flutter, uses daily.  She is on omeprazole 40mg  bid w good reflux control  CT chest 09/18/2019 reviewed by me, showed no evidence of PE, right middle lobe bronchiectasis, right upper lobe chronic scar, some new bilateral basilar airspace opacity concerning for pneumonia, new compared with her abdominal CT 07/29/2019   Review of Systems  Constitutional: Negative for fever and unexpected weight change.  HENT: Negative for congestion, dental problem, ear pain, nosebleeds, postnasal drip, rhinorrhea, sinus pressure, sneezing, sore throat and trouble swallowing.   Eyes: Negative for redness and itching.  Respiratory: Negative for cough, chest tightness, shortness of breath and wheezing.   Cardiovascular: Negative for palpitations and leg swelling.  Gastrointestinal: Negative for nausea and vomiting.  Genitourinary: Negative for dysuria.  Musculoskeletal: Negative for joint swelling.  Skin: Negative for rash.  Neurological: Negative for headaches.    Hematological: Does not bruise/bleed easily.  Psychiatric/Behavioral: Negative for dysphoric mood. The patient is not nervous/anxious.       Objective:   Physical Exam Vitals:   10/03/19 1014  BP: 124/64  Pulse: (!) 58  Temp: (!) 97.2 F (36.2 C)  TempSrc: Temporal  SpO2: 98%  Weight: 123 lb 12.8 oz (56.2 kg)  Height: 5\' 3"  (1.6 m)   Gen: Pleasant, kyphotic elderly woman, in no distress,  normal affect  ENT: No lesions,  mouth clear,  oropharynx clear, no postnasal drip  Neck: No JVD, no stridor  Lungs: No use of accessory muscles, no wheeze or crackles, decreased at bases  Cardiovascular: RRR, heart sounds normal, no murmur or gallops, no peripheral edema  Musculoskeletal: No deformities, no cyanosis or clubbing  Neuro: alert, non focal  Skin: Warm, no lesions or rashes       Assessment & Plan:  Pneumonia Recent right lower lobe pneumonia with hospitalization, visualized by CT chest.  She is clinically improved.  She needs repeat CT chest in about 6 weeks to ensure clearance.  Need to evaluate her degree of bronchiectasis and also the possibility for active sarcoid on that film particularly if her infiltrate does not clear.  Given the recurrence of her episodes I think it may be reasonable in the future to check a modified barium swallow to ensure no evidence of aspiration  Bronchiectasis without complication (HCC) Push flutter valve, secretion clearance Following repeat CT in September  Allergic rhinitis  Continue cetirizine once a day Continue fluticasone nasal spray 2 sprays each nostril once daily Continue Mucinex 600 mg twice a day Use your flutter valve twice a day to help you  clear mucus COVID19 vaccine up to date We will repeat your CT scan of the chest without contrast in mid September to follow pneumonia and bronchiectasis Follow with Dr. [name] in September after your CT scan to review the results together  SARCOIDOSIS, PULMONARY Seems to be  clinically benefiting from the [facility].  Probably needs repeat pulmonary function testing at some point going forward when she is fully recovered from this recent pneumonia.  I will continue the [facility] for now, albuterol as needed.  Baltazar Apo, MD, PhD 10/03/2019, 10:43 AM [facility] [phone] or if no answer [phone]

## 2019-10-03 NOTE — Addendum Note (Signed)
Addended by: Gavin Potters R on: 10/03/2019 10:57 AM   Modules accepted: Orders

## 2019-10-03 NOTE — Assessment & Plan Note (Signed)
Recent right lower lobe pneumonia with hospitalization, visualized by CT chest.  She is clinically improved.  She needs repeat CT chest in about 6 weeks to ensure clearance.  Need to evaluate her degree of bronchiectasis and also the possibility for active sarcoid on that film particularly if her infiltrate does not clear.  Given the recurrence of her episodes I think it may be reasonable in the future to check a modified barium swallow to ensure no evidence of aspiration

## 2019-10-03 NOTE — Assessment & Plan Note (Signed)
Seems to be clinically benefiting from the [name].  Probably needs repeat pulmonary function testing at some point going forward when she is fully recovered from this recent pneumonia.  I will continue the [facility] for now, albuterol as needed.

## 2019-10-15 ENCOUNTER — Encounter: Payer: Self-pay | Admitting: Physician Assistant

## 2019-10-15 ENCOUNTER — Ambulatory Visit (INDEPENDENT_AMBULATORY_CARE_PROVIDER_SITE_OTHER): Payer: Medicare Other | Admitting: Physician Assistant

## 2019-10-15 ENCOUNTER — Other Ambulatory Visit: Payer: Self-pay

## 2019-10-15 VITALS — BP 108/40 | HR 78 | Ht 63.0 in | Wt 120.0 lb

## 2019-10-15 DIAGNOSIS — I480 Paroxysmal atrial fibrillation: Secondary | ICD-10-CM

## 2019-10-15 DIAGNOSIS — E782 Mixed hyperlipidemia: Secondary | ICD-10-CM | POA: Diagnosis not present

## 2019-10-15 DIAGNOSIS — I10 Essential (primary) hypertension: Secondary | ICD-10-CM | POA: Diagnosis not present

## 2019-10-15 DIAGNOSIS — D869 Sarcoidosis, unspecified: Secondary | ICD-10-CM

## 2019-10-15 DIAGNOSIS — I2510 Atherosclerotic heart disease of native coronary artery without angina pectoris: Secondary | ICD-10-CM

## 2019-10-15 MED ORDER — APIXABAN 2.5 MG PO TABS: 2.5000 mg | ORAL_TABLET | Freq: Two times a day (BID) | ORAL | 6 refills | Status: DC

## 2019-10-15 NOTE — Progress Notes (Signed)
Cardiology Office Note:    Date:  10/15/2019   ID:  ZYIAH WITHINGTON, DOB 05/05/1934, MRN [id number]  PCP:  [name], MD  Cardiologist:  Sherren Mocha, MD   Electrophysiologist:  None   Referring MD: [name], MD   Chief Complaint:  Hospitalization Follow-up (Atrial fibrillation)    Patient Profile:    [name] is a 85 y.o. female with:   Coronary artery disease ? Cath 3/17: oLCx 60-70 (neg by FFR) ? Myoview 11/16: no ischemia ? Myoview 07/2018: EF 73, prob low risk (significant extracardiac uptake), no ischemia  Paroxysmal atrial fibrillation   CHA2DS2-VASc=5 (CAD, HTN, age x 2, female) >> Apixaban 2.5 mg (> 15 yo, wt <60 kg)   Pulmonary sarcoidosis  Bronchiectasis  Hypertension   Hyperlipidemia   SVT  Carotid stenosis ? [place] 06/2018: bilat 1-39  Prior CV studies: Echocardiogram 09/23/19 EF 55-60, GR 1 DD, RVSP 24.2, trivial MR  Echocardiogram 07/19/2019 EF 55-60, mild LVH, GR 1 DD, RVSP 36.9, trivial MR  Myoview 08/02/2018 EF: 73%, sig extracardiac uptake at rest, prob low risk  Carotid US 07/09/2018 Bilat ICA 1-39  Carotid US 10/17 bilat ICA 1-39>> FUprn  LHC 05/20/15 LAD plaque - nonobstructive LCx ost 70% >> FFR 0.99 > 0.95 (not hemodynamically significant); mid 40% RCA mid 30% EF 55-65%  Myoview 11/16 EF 75%, no ischemia, low risk  High Resolution CT 12/02/14 IMPRESSION: 1. No findings to suggest interstitial lung disease. 2. The overall appearance the chest is very similar to the prior study 11/28/2012. Findings are nonspecific, but could be seen in the setting of reported sarcoidosis, as detailed above. 3. Areas of bronchiectasis are noted in the right lung, most severe in the medial segment of the right middle lobe where there are areas of cystic bronchiectasis and architectural distortion. 4. Atherosclerosis, including left main and 3 vessel coronary artery disease. Assessment for potential risk factor  modification, dietary therapy or pharmacologic therapy may be warranted, if clinically indicated. 5. Additional incidental findings, as above.  Myoview 10/15 Normal stress nuclear study.LV Ejection Fraction: 70%.  Echo 10/15 EF 60-65%, no RWMA  Carotid US 9/15 Bilateral 1-39% ICA >> FU 2 years   History of Present Illness:    Ms. Decamp was last seen by me in 05/2019.  She was hospitalized in 07/2019 with urosepsis.  She was admitted 7/28-8/5 with community-acquired pneumonia.  She developed paroxysmal atrial fibrillation with rapid rate while in the hospital.  She was seen by Dr. Radford Pax with our service.  Given recurrent episodes of atrial fibrillation, she was placed on anticoagulation with Apixaban (2.5 mg twice daily due to age and weight).  She had bradycardia with higher doses of diltiazem.  She was discharged on diltiazem 180 mg daily.  An echocardiogram demonstrated normal LV function with mild diastolic dysfunction.  Of note, if she has more atrial fibrillation, she will likely need to see the AFib clinic to consider Dofetilide (Amiodarone is a poor option given her lung disease and she cannot take Flecainide due to coronary artery disease).    She returns for follow-up.  She is here alone.  She is feeling better since she was discharged from the hospital.  She has not had any recurrent palpitations reminiscent of her atrial fibrillation.  She has not had chest discomfort, syncope, orthopnea, leg swelling.  Her breathing is overall stable.  She has seen pulmonology and has a CT scan pending next month.  Past Medical History:  Diagnosis Date  .  Allergy    SEASONAL  . Anemia   . Arthritis   . Asthma    "slight"   . Baker's cyst of knee, right   . CAD (coronary artery disease)    a. Myoview 10/15 - normal EF 70% // Myoview 11/16: EF 75%, normal perfusion, (there was no blood pressure drop at low level exercise with Lexiscan infusion), low risk study // c. LHC 3/17 - LAD irregs,  oLCx 70 (neg FFR), mRCA 30, EF 55-65% >> med Rx // Myoview 07/2018:  EF 73, extracardiac uptake, no significant ischemia (reviewed with Dr. Burt Knack), Low Risk   . Carotid stenosis    a. Carotid US 9/15 - Bilateral 1-39% ICA >> FU 2 years // b. Bilateral ICA 1-39 >> FU prn // Carotid US 06/2018: bilat 1-39; fu prn  . Dyspnea    due to Sarcoidosis. When is windy or humed  . Dysrhythmia    fast heart rate  . Elbow fracture 04/2017   Right, had surgery  . Esophageal reflux   . Hematochezia   . History of echocardiogram    a. Echo 10/15 - EF 60-65%, no RWMA  . HLD (hyperlipidemia)   . Hx of colonoscopy   . Osteoporosis   . Other chronic pulmonary heart diseases   . Paroxysmal supraventricular tachycardia ([employer])   . Pneumonia   . Sarcoidosis     Current Medications: Current Meds  Medication Sig  . acetaminophen (TYLENOL) 500 MG tablet Take 1,000 mg by mouth every 6 (six) hours as needed for mild pain.   Marland Kitchen albuterol (VENTOLIN HFA) 108 (90 Base) MCG/ACT inhaler Inhale 2 puffs into the lungs every 4 (four) hours as needed for wheezing or shortness of breath.  Marland Kitchen apixaban (ELIQUIS) 2.5 MG TABS tablet Take 1 tablet (2.5 mg total) by mouth 2 (two) times daily.  Marland Kitchen aspirin EC 81 MG tablet Take 1 tablet (81 mg total) by mouth daily.  . benzonatate (TESSALON) 100 MG capsule Take 1 capsule (100 mg total) by mouth every 6 (six) hours as needed for cough.  . calcium carbonate (TUMS - DOSED IN MG ELEMENTAL CALCIUM) 500 MG chewable tablet Chew 1 tablet by mouth daily as needed for indigestion.   . cetirizine (ZYRTEC) 10 MG tablet Take 10 mg by mouth daily.  Marland Kitchen denosumab (PROLIA) 60 MG/ML SOLN injection Inject 60 mg into the skin every 6 (six) months. Administer in upper arm, thigh, or abdomen  . dextromethorphan (DELSYM) 30 MG/5ML liquid Take 30 mg by mouth 2 (two) times daily as needed for cough.  . diltiazem (CARDIZEM CD) 180 MG 24 hr capsule Take 180 mg by mouth at bedtime.   . docusate sodium (COLACE) 50  MG capsule Take 100 mg by mouth at bedtime.   . fluticasone (FLONASE) 50 MCG/ACT nasal spray Place 2 sprays into both nostrils 2 (two) times daily.  Marland Kitchen ipratropium (ATROVENT HFA) 17 MCG/ACT inhaler Inhale 1 puff into the lungs 2 (two) times daily.  . melatonin 5 MG TABS Take 10 mg by mouth at bedtime.   . nitroGLYCERIN (NITROSTAT) 0.4 MG SL tablet Place 1 tablet (0.4 mg total) under the tongue every 5 (five) minutes as needed for chest pain.  Marland Kitchen omeprazole (PRILOSEC) 40 MG capsule Take 40 mg by mouth 2 (two) times daily.   . ondansetron (ZOFRAN-ODT) 4 MG disintegrating tablet Take 4 mg by mouth every 4 (four) hours as needed for nausea or vomiting.   Marland Kitchen oxyCODONE-acetaminophen (PERCOCET) 5-325 MG tablet Take 1  tablet by mouth every 4 (four) hours as needed (max 6 q for post op pain).  . polyethylene glycol (MIRALAX / GLYCOLAX) 17 g packet Take 17 g by mouth daily.  . rosuvastatin (CRESTOR) 10 MG tablet Take 10 mg by mouth every Monday, Wednesday, and Friday.   . Vitamin D, Ergocalciferol, (DRISDOL) 50000 units CAPS capsule Take 50,000 Units by mouth every Friday.   . [DISCONTINUED] apixaban (ELIQUIS) 2.5 MG TABS tablet Take 1 tablet (2.5 mg total) by mouth 2 (two) times daily.     Allergies:   Levaquin [levofloxacin], Sulfonamide derivatives, Oxycodone, and Morphine   Social History   Tobacco Use  . Smoking status: Never Smoker  . Smokeless tobacco: Never Used  Vaping Use  . Vaping Use: Never used  Substance Use Topics  . Alcohol use: No  . Drug use: No     Family Hx: The patient's family history includes Arrhythmia in her father; Cancer in her father, mother, and sister; Colon cancer (age of onset: 63) in her mother; Heart attack in her brother; Heart disease in her father; Lung cancer in her father. There is no history of Esophageal cancer, Rectal cancer, Stomach cancer, or Stroke.  ROS   EKGs/Labs/Other Test Reviewed:    EKG:  EKG is not ordered today.  The ekg ordered today  demonstrates n/a  Recent Labs: 07/20/2019: B Natriuretic Peptide 273.6 09/19/2019: ALT 17 09/23/2019: TSH 0.787 09/26/2019: BUN 30; Creatinine, Ser 0.74; Hemoglobin 12.5; Magnesium 2.3; Platelets 234; Potassium 5.1; Sodium 131   Recent Lipid Panel Lab Results  Component Value Date/Time   CHOL 149 11/27/2017 08:00 AM   TRIG 67 11/27/2017 08:00 AM   HDL 73 11/27/2017 08:00 AM   CHOLHDL 2.0 11/27/2017 08:00 AM   CHOLHDL 2.0 11/03/2015 07:42 AM   LDLCALC 63 11/27/2017 08:00 AM    Physical Exam:    VS:  BP (!) 108/40   Pulse 78   Ht 5\' 3"  (1.6 m)   Wt 120 lb (54.4 kg)   LMP 02/22/1983 (Approximate)   SpO2 94%   BMI 21.26 kg/m     Wt Readings from Last 3 Encounters:  10/15/19 120 lb (54.4 kg)  10/03/19 123 lb 12.8 oz (56.2 kg)  09/18/19 117 lb (53.1 kg)     Constitutional:      Appearance: Healthy appearance. Not in distress.  Pulmonary:     Effort: Pulmonary effort is normal.     Breath sounds: No wheezing. No rales.  Cardiovascular:     Normal rate. Regular rhythm. Normal S1. Normal S2.     Murmurs: There is no murmur.  Edema:    Peripheral edema absent.  Abdominal:     Palpations: Abdomen is soft.  Skin:    General: Skin is warm and dry.  Neurological:     Mental Status: Alert and oriented to person, place and time.     Cranial Nerves: Cranial nerves are intact.      ASSESSMENT & PLAN:    1. Paroxysmal atrial fibrillation (HCC) Atrial fibrillation occurred in the context of an acute illness.  However, she continued to have episodes of atrial fibrillation in the hospital and was placed on anticoagulation given her stroke risk.  I have explained to her that I believe she should remain on anticoagulation for now.  She knows to contact us if she has recurrent palpitations.  On exam today, she is maintaining sinus rhythm with normal heart rate.  Continue Apixaban.  Obtain follow-up BMET, CBC in  6 weeks.  2. Coronary artery disease involving native coronary artery of  native heart without angina pectoris Moderate circumflex disease by cardiac catheterization in 2017.  Myoview low risk in 2020.  She is not having anginal symptoms.  She no longer needs aspirin as she is on Apixaban.  This can be discontinued.  Continue rosuvastatin.  3. Essential hypertension The patient's blood pressure is controlled on her current regimen.  Continue current therapy.   4. Mixed hyperlipidemia Continue rosuvastatin.  5. SARCOIDOSIS, PULMONARY Continue follow-up with pulmonology as planned.    Dispo:  Return in about 3 months (around 01/15/2020) for Routine Follow Up, w/ [name], PA-C, in person.   Medication Adjustments/Labs and Tests Ordered: Current medicines are reviewed at length with the patient today.  Concerns regarding medicines are outlined above.  Tests Ordered: Orders Placed This Encounter  Procedures  . Basic metabolic panel  . CBC   Medication Changes: Meds ordered this encounter  Medications  . apixaban (ELIQUIS) 2.5 MG TABS tablet    Sig: Take 1 tablet (2.5 mg total) by mouth 2 (two) times daily.    Dispense:  180 tablet    Refill:  6    Patient will call when she is ready to have this filled    Signed, [name], PA-C  10/15/2019 4:55 PM    [address], [place], [place]  [address] Phone: [phone]; Fax: [phone]

## 2019-10-15 NOTE — Patient Instructions (Addendum)
Medication Instructions:  Your physician recommends that you continue on your current medications as directed. Please refer to the Current Medication list given to you today.  *If you need a refill on your cardiac medications before your next appointment, please call your pharmacy*  Lab Work: Your physician recommends that you return for lab work in 6 weeks on 11/26/19; you do not need to be fasting. You may come anytime between 7:30AM and 4:30PM to have your labs drawn.  Testing/Procedures: None ordered today  Follow-Up: On 01/15/20 at 1:45PM with [name], PA-C

## 2019-10-29 ENCOUNTER — Other Ambulatory Visit: Payer: Self-pay | Admitting: Physician Assistant

## 2019-10-29 DIAGNOSIS — I480 Paroxysmal atrial fibrillation: Secondary | ICD-10-CM

## 2019-10-29 MED ORDER — APIXABAN 2.5 MG PO TABS: 2.5000 mg | ORAL_TABLET | Freq: Two times a day (BID) | ORAL | 3 refills | Status: DC

## 2019-10-29 NOTE — Telephone Encounter (Signed)
*  STAT* If patient is at the pharmacy, call can be transferred to refill team.   1. Which medications need to be refilled? (please list name of each medication and dose if known) apixaban (ELIQUIS) 2.5 MG TABS tablet  2. Which pharmacy/location (including street and city if local pharmacy) is medication to be sent to? [place], [place]  3. Do they need a 30 day or 90 day supply? 90 day   Patient states [employer] has not received refill. She would like a call back when refill is sent.

## 2019-10-29 NOTE — Telephone Encounter (Signed)
Prescription refill request for Eliquis received. Indication: A Fib Last office visit: 10/15/19 Scr: 0.74 Age: 85 Weight: 54.4 kg

## 2019-10-30 ENCOUNTER — Other Ambulatory Visit: Payer: Self-pay

## 2019-10-30 ENCOUNTER — Ambulatory Visit (INDEPENDENT_AMBULATORY_CARE_PROVIDER_SITE_OTHER): Payer: Medicare Other | Admitting: Emergency Medicine

## 2019-10-30 ENCOUNTER — Encounter: Payer: Self-pay | Admitting: Emergency Medicine

## 2019-10-30 DIAGNOSIS — R600 Localized edema: Secondary | ICD-10-CM | POA: Diagnosis not present

## 2019-10-30 DIAGNOSIS — I2510 Atherosclerotic heart disease of native coronary artery without angina pectoris: Secondary | ICD-10-CM | POA: Diagnosis not present

## 2019-10-30 DIAGNOSIS — R609 Edema, unspecified: Secondary | ICD-10-CM

## 2019-10-30 DIAGNOSIS — J449 Chronic obstructive pulmonary disease, unspecified: Secondary | ICD-10-CM | POA: Insufficient documentation

## 2019-10-30 HISTORY — DX: Edema, unspecified: R60.9

## 2019-10-30 MED ORDER — FUROSEMIDE 20 MG PO TABS
20.0000 mg | ORAL_TABLET | Freq: Every day | ORAL | 0 refills | Status: DC
Start: 2019-10-30 — End: 2019-11-06

## 2019-10-30 MED ORDER — APIXABAN 2.5 MG PO TABS: 2.5000 mg | ORAL_TABLET | Freq: Two times a day (BID) | ORAL | 1 refills | Status: DC

## 2019-10-30 NOTE — Patient Instructions (Addendum)
Please continue Dulera 2 puffs twice a day.  Rinse and gargle after using. Continue Mucinex, cetirizine, fluticasone nasal spray as you have been doing. Continue your flutter valve once daily We will repeat your CT chest later this month as planned Take Lasix 20 mg once daily for the next 3 days and then stop.  If your swelling continues then we will discuss with cardiology. Follow with Dr [name] after your CT scan later this month to review.

## 2019-10-30 NOTE — Assessment & Plan Note (Signed)
Brief course of Lasix 20 mg once daily for 3 days.  If she has persistent lower extremity edema then we will consult with cardiology to see if any adjustments need to be made long-term.

## 2019-10-30 NOTE — Assessment & Plan Note (Signed)
Continue Mucinex, cetirizine, fluticasone nasal spray as you have been doing.

## 2019-10-30 NOTE — Telephone Encounter (Signed)
Patient following up - she wants prescription sent to [place].

## 2019-10-30 NOTE — Assessment & Plan Note (Signed)
Please continue Dulera 2 puffs twice a day.  Rinse and gargle after using.

## 2019-10-30 NOTE — Progress Notes (Signed)
Subjective:    Patient ID: [name], female    DOB: 16-Jun-1934, 85 y.o.   MRN: [id number]  HPI 85 yo woman, never smoker, hx sarcoidosis that was dx by Dr Lucia Gaskins in [place] after FOB (? Whether she had biopsies or not), PSVT, GERD, chronic rhinitis/allergies. Severe obstruction identified by pulmonary function testing 2015. She has had pneumonias in the past, left with some parenchymal distortion and bronchiectasis, including in October 2020 and this hospitalization from 07/28-08/06/2019. She was found to have atrial fibrillation with RVR during that hospitalization as well. Seemed to start as sinus drainage and pain.  She was treated with antibiotics as an outpt prior to the admission, then abx and corticosteroids inpt.  Currently managed on Dulera, has albuterol that she uses approximately few times a week. She believes the [facility] helps her  Lots of persistent nasal gtt, on zyrtec, flonase reliably, on mucinex bid.  No aspiration sx.  She has a flutter, uses daily.  She is on omeprazole 40mg  bid w good reflux control  CT chest 09/18/2019 reviewed by me, showed no evidence of PE, right middle lobe bronchiectasis, right upper lobe chronic scar, some new bilateral basilar airspace opacity concerning for pneumonia, new compared with her abdominal CT 07/29/2019  ROV 10/30/19 --follow-up visit for 85 year old never smoker with her sarcoidosis.  Also with SVT, GERD, chronic rhinitis.  She has severe obstructive lung disease by pulmonary function testing currently managed on Dulera.  She was recently treated for pneumonia, hospitalized.  She has associated lower lobe infiltrates and bronchiectasis on CT chest 09/18/2019. We arranged for CT chest, scheduled for 9/14. She has been having increased LE edema for the last several weeks since our OV. She remains on mucinex, cetirizine, fluticasone. She has flutter, uses daily  Review of Systems  Constitutional: Negative for fever and unexpected weight change.   HENT: Negative for congestion, dental problem, ear pain, nosebleeds, postnasal drip, rhinorrhea, sinus pressure, sneezing, sore throat and trouble swallowing.   Eyes: Negative for redness and itching.  Respiratory: Negative for cough, chest tightness, shortness of breath and wheezing.   Cardiovascular: Negative for palpitations and leg swelling.  Gastrointestinal: Negative for nausea and vomiting.  Genitourinary: Negative for dysuria.  Musculoskeletal: Negative for joint swelling.  Skin: Negative for rash.  Neurological: Negative for headaches.  Hematological: Does not bruise/bleed easily.  Psychiatric/Behavioral: Negative for dysphoric mood. The patient is not nervous/anxious.       Objective:   Physical Exam Vitals:   10/30/19 1219  BP: (!) 142/72  Pulse: 76  Temp: (!) 97.3 F (36.3 C)  TempSrc: Temporal  SpO2: 93%  Weight: 123 lb (55.8 kg)  Height: 5\' 3"  (1.6 m)   Gen: Pleasant, kyphotic elderly woman, in no distress,  normal affect  ENT: No lesions,  mouth clear,  oropharynx clear, no postnasal drip  Neck: No JVD, no stridor  Lungs: No use of accessory muscles, no wheeze or crackles, decreased at bases  Cardiovascular: RRR, heart sounds normal, no murmur or gallops, 2+ LE ankle peripheral edema  Musculoskeletal: No deformities, no cyanosis or clubbing  Neuro: alert, non focal  Skin: Warm, no lesions or rashes       Assessment & Plan:  Bronchiectasis without complication (HCC) Continue your flutter valve once daily We will repeat your CT chest later this month as planned. Follow with Dr [name] after your CT scan later this month to review  Allergic rhinitis Continue Mucinex, cetirizine, fluticasone nasal spray as you have been  doing.  COPD (chronic obstructive pulmonary disease) ([place]) Please continue Dulera 2 puffs twice a day.  Rinse and gargle after using.  Edema Brief course of Lasix 20 mg once daily for 3 days.  If she has persistent lower extremity  edema then we will consult with cardiology to see if any adjustments need to be made long-term.  Baltazar Apo, MD, PhD 10/30/2019, 12:44 PM [facility] [phone] or if no answer [phone]

## 2019-10-30 NOTE — Assessment & Plan Note (Signed)
Continue your flutter valve once daily We will repeat your CT chest later this month as planned. Follow with Dr [name] after your CT scan later this month to review

## 2019-10-30 NOTE — Telephone Encounter (Signed)
  Prescription refill request for Eliquis received.  Last office visit: [name] 10/15/2019 Scr: 0.74, 09/26/2019 Age: 85  Weight: 55.8 kg   Prescription refill sent.

## 2019-10-30 NOTE — Addendum Note (Signed)
Addended by: Johny Shock B on: 10/30/2019 03:43 PM   Modules accepted: Orders

## 2019-11-01 ENCOUNTER — Telehealth: Payer: Self-pay | Admitting: Emergency Medicine

## 2019-11-01 NOTE — Telephone Encounter (Signed)
PA request received from [facility]  Drug requested: Dulera 200 CMM Key: [id number] Tried/failed: on file Covered alternatives: Symbicort, Breo, Trelegy, Flovent, Spiriva Respimat, Arnuity, Stiolto, [place] PA request has been sent to plan, and a determination is expected within 3 days.   Routing to [place] for follow-up.

## 2019-11-04 ENCOUNTER — Telehealth: Payer: Self-pay | Admitting: Physician Assistant

## 2019-11-04 NOTE — Telephone Encounter (Signed)
Eliquis should note cause swelling.  Ask her if she is short of breath. Arrange a BMET, CBC.  If she is short of breath, get a BNP as well. She needs a follow up appt this week to evaluate. [name], PA-C    11/04/2019 4:58 PM

## 2019-11-04 NOTE — Telephone Encounter (Signed)
Pt c/o medication issue:  1. Name of Medication: apixaban (ELIQUIS) 2.5 MG TABS tablet  2. How are you currently taking this medication (dosage and times per day)?   3. Are you having a reaction (difficulty breathing--STAT)?   4. What is your medication issue? Feet and ankles are swelling, its' going up her legs. Patient feels it's the eliquis that is causing this, she states this started happening when she starting taking it.  Wants to know what she should do.

## 2019-11-05 ENCOUNTER — Ambulatory Visit
Admission: RE | Admit: 2019-11-05 | Discharge: 2019-11-05 | Disposition: A | Discharge status: Home / Self Care | Payer: Medicare Other | Source: Ambulatory Visit | Attending: Emergency Medicine | Admitting: Emergency Medicine

## 2019-11-05 DIAGNOSIS — J479 Bronchiectasis, uncomplicated: Secondary | ICD-10-CM

## 2019-11-05 DIAGNOSIS — J189 Pneumonia, unspecified organism: Secondary | ICD-10-CM

## 2019-11-05 NOTE — Telephone Encounter (Signed)
I called and spoke with patient, she will come in tomorrow at 4:15 to discuss swelling with [name]. Patient also aware that she does not need to stop Eliquis for routine dental cleanings. Patient verbalized understanding and thanked me for the call.

## 2019-11-05 NOTE — Telephone Encounter (Signed)
I called and spoke with patient, she states that she is not short of breath. 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 Also she is having a routine cleaning in a couple of weeks and wants to know if Eliquis needs to be held the day of the dental appointment?

## 2019-11-05 NOTE — Telephone Encounter (Signed)
I can see her at [phone] tomorrow. No need to hold Eliquis for dental cleaning. [name], PA-C    11/05/2019 1:45 PM

## 2019-11-06 ENCOUNTER — Other Ambulatory Visit: Payer: Self-pay

## 2019-11-06 ENCOUNTER — Encounter: Payer: Self-pay | Admitting: Physician Assistant

## 2019-11-06 ENCOUNTER — Other Ambulatory Visit: Payer: Self-pay | Admitting: Physician Assistant

## 2019-11-06 ENCOUNTER — Ambulatory Visit (INDEPENDENT_AMBULATORY_CARE_PROVIDER_SITE_OTHER): Payer: Medicare Other | Admitting: Physician Assistant

## 2019-11-06 VITALS — BP 120/62 | HR 61 | Ht 63.0 in | Wt 122.4 lb

## 2019-11-06 DIAGNOSIS — D869 Sarcoidosis, unspecified: Secondary | ICD-10-CM

## 2019-11-06 DIAGNOSIS — I480 Paroxysmal atrial fibrillation: Secondary | ICD-10-CM | POA: Diagnosis not present

## 2019-11-06 DIAGNOSIS — M7989 Other specified soft tissue disorders: Secondary | ICD-10-CM

## 2019-11-06 DIAGNOSIS — Z79899 Other long term (current) drug therapy: Secondary | ICD-10-CM

## 2019-11-06 DIAGNOSIS — I2510 Atherosclerotic heart disease of native coronary artery without angina pectoris: Secondary | ICD-10-CM | POA: Diagnosis not present

## 2019-11-06 DIAGNOSIS — I10 Essential (primary) hypertension: Secondary | ICD-10-CM | POA: Diagnosis not present

## 2019-11-06 MED ORDER — BUDESONIDE-FORMOTEROL FUMARATE 160-4.5 MCG/ACT IN AERO: 2.0000 | INHALATION_SPRAY | Freq: Two times a day (BID) | RESPIRATORY_TRACT | 12 refills | Status: DC

## 2019-11-06 MED ORDER — FUROSEMIDE 20 MG PO TABS
ORAL_TABLET | ORAL | 1 refills | Status: DC
Start: 2019-11-06 — End: 2020-04-20

## 2019-11-06 MED ORDER — NITROGLYCERIN 0.4 MG SL SUBL: 0.4000 mg | SUBLINGUAL_TABLET | SUBLINGUAL | 3 refills | Status: DC | PRN

## 2019-11-06 NOTE — Patient Instructions (Addendum)
Medication Instructions:  Your physician has recommended you make the following change in your medication:  1. START Furosemide 20 mg once a day for 3 days.  Then stop and only take it as needed.  Do not take for more than 2/3 days at a time.  Please call the office if you start needing to take this frequently.   *If you need a refill on your cardiac medications before your next appointment, please call your pharmacy*   Lab Work: Your physician recommends that you return for lab work tomorrow/Friday:  CMET, BNP, and CBC  If you have labs (blood work) drawn today and your tests are completely normal, you will receive your results only by: Marland Kitchen MyChart Message (if you have MyChart) OR . A paper copy in the mail If you have any lab test that is abnormal or we need to change your treatment, we will call you to review the results.   Testing/Procedures: None ordered   Follow-Up: At [facility], you and your health needs are our priority.  As part of our continuing mission to provide you with exceptional heart care, we have created designated Provider Care Teams.  These Care Teams include your primary Cardiologist (physician) and Advanced Practice Providers (APPs -  Physician Assistants and Nurse Practitioners) who all work together to provide you with the care you need, when you need it.  We recommend signing up for the patient portal called "MyChart".  Sign up information is provided on this After Visit Summary.  MyChart is used to connect with patients for Virtual Visits (Telemedicine).  Patients are able to view lab/test results, encounter notes, upcoming appointments, etc.  Non-urgent messages can be sent to your provider as well.   To learn more about what you can do with MyChart, go to [email or web address].    Your next appointment:   3-4 week(s)  The format for your next appointment:   In Person  Provider:   [name], PA-C   Other Instructions  Increase your activity as  much as possible.  Please use your knee high compression stockings when you are on your feet  I recommend that you elevate your legs . Go to [email or web address]

## 2019-11-06 NOTE — Telephone Encounter (Signed)
PA has been denied for the [facility].   RB please advise. Thanks

## 2019-11-06 NOTE — Telephone Encounter (Signed)
Symbicort 160/4.5 would be a good alternative if she is willing to change.

## 2019-11-06 NOTE — Progress Notes (Signed)
Cardiology Office Note:    Date:  11/06/2019   ID:  [name], DOB [birth year], MRN [id number]  PCP:  [name], MD  [facility] Cardiologist:  Sherren Mocha, MD  [place] Electrophysiologist:  None   Referring MD: [name], MD   Chief Complaint:  Leg Swelling    Patient Profile:    [name] is a 85 y.o. female with:   Coronary artery disease ? Cath 3/17: oLCx 60-70 (neg by FFR) ? Myoview 11/16: no ischemia ? Myoview 07/2018: EF 73, prob low risk (significant extracardiac uptake), no ischemia  Paroxysmal atrial fibrillation  ? CHA2DS2-VASc=5 (CAD, HTN, age x 2, female) >> Apixaban 2.5 mg (> 20 yo, wt <60 kg)   Pulmonary sarcoidosis  Bronchiectasis  Hypertension   Hyperlipidemia   SVT  Carotid stenosis ? [place] 06/2018: bilat 1-39  Prior CV studies: Echocardiogram 09/23/19 EF 55-60, GR 1 DD, RVSP 24.2, trivial MR  Echocardiogram 07/19/2019 EF 55-60, mild LVH, GR 1 DD, RVSP 36.9, trivial MR  Myoview 08/02/2018 EF: 73%, sig extracardiac uptake at rest, prob low risk  Carotid US 07/09/2018 Bilat ICA 1-39  Carotid US 10/17 bilat ICA 1-39>> FUprn  LHC 05/20/15 LAD plaque - nonobstructive LCx ost 70% >> FFR 0.99 > 0.95 (not hemodynamically significant); mid 40% RCA mid 30% EF 55-65%  Myoview 11/16 EF 75%, no ischemia, low risk  High Resolution CT 12/02/14 IMPRESSION: 1. No findings to suggest interstitial lung disease. 2. The overall appearance the chest is very similar to the prior study 11/28/2012. Findings are nonspecific, but could be seen in the setting of reported sarcoidosis, as detailed above. 3. Areas of bronchiectasis are noted in the right lung, most severe in the medial segment of the right middle lobe where there are areas of cystic bronchiectasis and architectural distortion. 4. Atherosclerosis, including left main and 3 vessel coronary artery disease. Assessment for potential risk factor modification,  dietary therapy or pharmacologic therapy may be warranted, if clinically indicated. 5. Additional incidental findings, as above.  Myoview 10/15 Normal stress nuclear study.LV Ejection Fraction: 70%.  Echo 10/15 EF 60-65%, no RWMA  Carotid US 9/15 Bilateral 1-39% ICA >> FU 2 years   History of Present Illness:    [name] was last seen 10/15/2019.  She had been admitted to the hospital in July 2021 with community-acquired pneumonia complicated by paroxysmal atrial fibrillation with rapid rate.  She was placed on Apixaban for anticoagulation.  She called in recently with lower extremity swelling.  She returns for further evaluation.  She is here alone.  She has had leg swelling since she was in the hospital for pneumonia and placed on Apixaban.  She has not had any worsening shortness of breath.  She has not had orthopnea, chest discomfort, syncope.  Her swelling does improve somewhat with elevation but does not completely resolve.      Past Medical History:  Diagnosis Date   Allergy    SEASONAL   Anemia    Arthritis    Asthma    "slight"    Baker's cyst of knee, right    CAD (coronary artery disease)    a. Myoview 10/15 - normal EF 70% // Myoview 11/16: EF 75%, normal perfusion, (there was no blood pressure drop at low level exercise with Lexiscan infusion), low risk study // c. LHC 3/17 - LAD irregs, oLCx 70 (neg FFR), mRCA 30, EF 55-65% >> med Rx // Myoview 07/2018:  EF 73, extracardiac uptake, no significant  ischemia (reviewed with Dr. Burt Knack), Low Risk    Carotid stenosis    a. Carotid US 9/15 - Bilateral 1-39% ICA >> FU 2 years // b. Bilateral ICA 1-39 >> FU prn // Carotid US 06/2018: bilat 1-39; fu prn   Dyspnea    due to Sarcoidosis. When is windy or humed   Dysrhythmia    fast heart rate   Elbow fracture 04/2017   Right, had surgery   Esophageal reflux    Hematochezia    History of echocardiogram    a. Echo 10/15 - EF 60-65%, no RWMA   HLD  (hyperlipidemia)    Hx of colonoscopy    Osteoporosis    Other chronic pulmonary heart diseases    Paroxysmal supraventricular tachycardia (HCC)    Pneumonia    Sarcoidosis     Current Medications: Current Meds  Medication Sig   acetaminophen (TYLENOL) 500 MG tablet Take 1,000 mg by mouth every 6 (six) hours as needed for mild pain.    albuterol (VENTOLIN HFA) 108 (90 Base) MCG/ACT inhaler Inhale 2 puffs into the lungs every 4 (four) hours as needed for wheezing or shortness of breath.   apixaban (ELIQUIS) 2.5 MG TABS tablet Take 1 tablet (2.5 mg total) by mouth 2 (two) times daily.   benzonatate (TESSALON) 100 MG capsule Take 1 capsule (100 mg total) by mouth every 6 (six) hours as needed for cough.   calcium carbonate (TUMS - DOSED IN MG ELEMENTAL CALCIUM) 500 MG chewable tablet Chew 1 tablet by mouth daily as needed for indigestion.    cetirizine (ZYRTEC) 10 MG tablet Take 10 mg by mouth daily.   denosumab (PROLIA) 60 MG/ML SOLN injection Inject 60 mg into the skin every 6 (six) months. Administer in upper arm, thigh, or abdomen   dextromethorphan (DELSYM) 30 MG/5ML liquid Take 30 mg by mouth 2 (two) times daily as needed for cough.   diltiazem (CARDIZEM CD) 180 MG 24 hr capsule Take 180 mg by mouth at bedtime.    docusate sodium (COLACE) 50 MG capsule Take 100 mg by mouth at bedtime.    fluticasone (FLONASE) 50 MCG/ACT nasal spray Place 2 sprays into both nostrils 2 (two) times daily.   ipratropium (ATROVENT HFA) 17 MCG/ACT inhaler Inhale 1 puff into the lungs 2 (two) times daily.   melatonin 5 MG TABS Take 10 mg by mouth at bedtime.    nitroGLYCERIN (NITROSTAT) 0.4 MG SL tablet Place 1 tablet (0.4 mg total) under the tongue every 5 (five) minutes as needed for chest pain.   omeprazole (PRILOSEC) 40 MG capsule Take 40 mg by mouth 2 (two) times daily.    ondansetron (ZOFRAN-ODT) 4 MG disintegrating tablet Take 4 mg by mouth every 4 (four) hours as needed for nausea  or vomiting.    oxyCODONE-acetaminophen (PERCOCET) 5-325 MG tablet Take 1 tablet by mouth every 4 (four) hours as needed (max 6 q for post op pain).   polyethylene glycol (MIRALAX / GLYCOLAX) 17 g packet Take 17 g by mouth daily.   rosuvastatin (CRESTOR) 10 MG tablet Take 10 mg by mouth every Monday, Wednesday, and Friday.    Vitamin D, Ergocalciferol, (DRISDOL) 50000 units CAPS capsule Take 50,000 Units by mouth every Friday.    [DISCONTINUED] aspirin EC 81 MG tablet Take 1 tablet (81 mg total) by mouth daily.   [DISCONTINUED] furosemide (LASIX) 20 MG tablet Take 1 tablet (20 mg total) by mouth daily.   [DISCONTINUED] nitroGLYCERIN (NITROSTAT) 0.4 MG SL tablet Place  1 tablet (0.4 mg total) under the tongue every 5 (five) minutes as needed for chest pain.     Allergies:   Levaquin [levofloxacin], Sulfonamide derivatives, Oxycodone, and Morphine   Social History   Tobacco Use   Smoking status: Never Smoker   Smokeless tobacco: Never Used  Vaping Use   Vaping Use: Never used  Substance Use Topics   Alcohol use: No   Drug use: No     Family Hx: The patient's family history includes Arrhythmia in her father; Cancer in her father, mother, and sister; Colon cancer (age of onset: 34) in her mother; Heart attack in her brother; Heart disease in her father; Lung cancer in her father. There is no history of Esophageal cancer, Rectal cancer, Stomach cancer, or Stroke.  Review of Systems  Respiratory: Negative for hemoptysis.   Gastrointestinal: Negative for hematochezia and melena.  Genitourinary: Negative for hematuria.     EKGs/Labs/Other Test Reviewed:    EKG:  EKG is  ordered today.  The ekg ordered today demonstrates normal sinus rhythm, heart rate 61, leftward axis, septal Q waves, no ST-T wave changes, QTC 404  Recent Labs: 07/20/2019: B Natriuretic Peptide 273.6 09/19/2019: ALT 17 09/23/2019: TSH 0.787 09/26/2019: BUN 30; Creatinine, Ser 0.74; Hemoglobin 12.5; Magnesium  2.3; Platelets 234; Potassium 5.1; Sodium 131   Recent Lipid Panel Lab Results  Component Value Date/Time   CHOL 149 11/27/2017 08:00 AM   TRIG 67 11/27/2017 08:00 AM   HDL 73 11/27/2017 08:00 AM   CHOLHDL 2.0 11/27/2017 08:00 AM   CHOLHDL 2.0 11/03/2015 07:42 AM   LDLCALC 63 11/27/2017 08:00 AM    Physical Exam:    VS:  BP 120/62    Pulse 61    Ht _0  (1.6 m)    Wt 122 lb 6.4 oz (55.5 kg)    LMP 02/22/1983 (Approximate)    SpO2 98%    BMI 21.68 kg/m     Wt Readings from Last 3 Encounters:  11/06/19 122 lb 6.4 oz (55.5 kg)  10/30/19 123 lb (55.8 kg)  10/15/19 120 lb (54.4 kg)     Constitutional:      Appearance: Healthy appearance. Not in distress.  Pulmonary:     Effort: Pulmonary effort is normal.     Breath sounds: No wheezing. No rales.  Cardiovascular:     Normal rate. Regular rhythm. Normal S1. Normal S2.     Murmurs: There is no murmur.  Edema:    Pretibial: bilateral 1+ pitting edema of the pretibial area.    Ankle: bilateral 1+ pitting edema of the ankle.    Feet: bilateral 1+ pitting edema of the feet.    Comments: Mid calf down to feet Abdominal:     General: There is no distension.     Palpations: Abdomen is soft.  Musculoskeletal:     Cervical back: Neck supple. Skin:    General: Skin is warm and dry.  Neurological:     Mental Status: Alert and oriented to person, place and time.     Cranial Nerves: Cranial nerves are intact.      ASSESSMENT & PLAN:    1. Leg swelling I suspect her lower extremity swelling is related to venous insufficiency.  She does have some evidence of varicose veins on exam.  She has been on diltiazem for a long time.  So I do not think her swelling is related to calcium channel blocker therapy.  She had normal LV function and mild diastolic  function and echocardiogram in August 2021.  Her RVSP was normal at that time.  I will give her furosemide to take 20 mg daily for 3 days.  She should only take this as needed after 3 days.   I have recommended leg elevation as well as compression stockings.  Have also encouraged increased activity.  I will obtain a c-Met, CBC and BNP today.  Follow-up with me in 4 weeks.  2. Paroxysmal atrial fibrillation (HCC) Maintaining sinus rhythm.  Continue anticoagulation with Apixaban.  Continue diltiazem for rate control.  3. Coronary artery disease involving native coronary artery of native heart without angina pectoris Moderate circumflex disease by catheterization 2017.  Myoview was low risk in 2020.  She is not having anginal symptoms.  She is not on aspirin as she is now on Apixaban.  Continue rosuvastatin.  4. Essential hypertension The patient's blood pressure is controlled on her current regimen.  Continue current therapy.   5. SARCOIDOSIS, PULMONARY Continue follow-up with pulmonology     Dispo:  Return in about 4 weeks (around 12/04/2019) for Routine Follow Up, w/ [name], PA-C, in person.   Medication Adjustments/Labs and Tests Ordered: Current medicines are reviewed at length with the patient today.  Concerns regarding medicines are outlined above.  Tests Ordered: Orders Placed This Encounter  Procedures   Pro b natriuretic peptide (BNP)   Comp Met (CMET)   CBC   EKG 12-Lead   Medication Changes: Meds ordered this encounter  Medications   nitroGLYCERIN (NITROSTAT) 0.4 MG SL tablet    Sig: Place 1 tablet (0.4 mg total) under the tongue every 5 (five) minutes as needed for chest pain.    Dispense:  25 tablet    Refill:  3   furosemide (LASIX) 20 MG tablet    Sig: Take one tablet daily as needed for swelling    Dispense:  30 tablet    Refill:  1    Signed, [name], PA-C  11/06/2019 5:26 PM    [facility] [address], [place], [place]  [address] Phone: [phone]; Fax: [phone]

## 2019-11-06 NOTE — Telephone Encounter (Signed)
Called and spoke with pt letting her know that PA for [facility] was denied and that RB wanted to switch her to Symb 160 if she was okay with it. Pt stated she was fine with [place] switching her to Sym 160. Verified pt's preferred pharmacy and sent Rx in for pt. Nothing further needed.

## 2019-11-07 NOTE — Addendum Note (Signed)
Addended by: Eulis Foster on: 11/07/2019 10:16 AM   Modules accepted: Orders

## 2019-11-08 LAB — COMPREHENSIVE METABOLIC PANEL
ALT: 7 IU/L (ref 0–32)
AST: 15 IU/L (ref 0–40)
Albumin/Globulin Ratio: 1.6 (ref 1.2–2.2)
Albumin: 3.9 g/dL (ref 3.6–4.6)
Alkaline Phosphatase: 131 IU/L — ABNORMAL HIGH (ref 44–121)
BUN/Creatinine Ratio: 24 (ref 12–28)
BUN: 19 mg/dL (ref 8–27)
Bilirubin Total: 0.3 mg/dL (ref 0.0–1.2)
CO2: 22 mmol/L (ref 20–29)
Calcium: 9.7 mg/dL (ref 8.7–10.3)
Chloride: 104 mmol/L (ref 96–106)
Creatinine, Ser: 0.8 mg/dL (ref 0.57–1.00)
GFR calc Af Amer: 79 mL/min/{1.73_m2} (ref >59)
GFR calc non Af Amer: 68 mL/min/{1.73_m2} (ref >59)
Globulin, Total: 2.4 g/dL (ref 1.5–4.5)
Glucose: 90 mg/dL (ref 65–99)
Potassium: 4.5 mmol/L (ref 3.5–5.2)
Sodium: 142 mmol/L (ref 134–144)
Total Protein: 6.3 g/dL (ref 6.0–8.5)

## 2019-11-08 LAB — CBC
Hematocrit: 41.1 % (ref 34.0–46.6)
Hemoglobin: 13.7 g/dL (ref 11.1–15.9)
MCH: 31.4 pg (ref 26.6–33.0)
MCHC: 33.3 g/dL (ref 31.5–35.7)
MCV: 94 fL (ref 79–97)
Platelets: 279 10*3/uL (ref 150–450)
RBC: 4.36 x10E6/uL (ref 3.77–5.28)
RDW: 13.8 % (ref 11.7–15.4)
WBC: 7.8 10*3/uL (ref 3.4–10.8)

## 2019-11-08 LAB — PRO B NATRIURETIC PEPTIDE: NT-Pro BNP: 520 pg/mL (ref 0–738)

## 2019-11-12 NOTE — Progress Notes (Signed)
Called and spoke with patient about CT results per Dr [name]. All questions answered and patient expressed full understanding. Also confirmed with patient her follow up appointment scheduled next week. Nothing further needed at this time.

## 2019-11-15 DIAGNOSIS — H9193 Unspecified hearing loss, bilateral: Secondary | ICD-10-CM | POA: Insufficient documentation

## 2019-11-15 DIAGNOSIS — R0982 Postnasal drip: Secondary | ICD-10-CM | POA: Insufficient documentation

## 2019-11-19 ENCOUNTER — Encounter: Payer: Self-pay | Admitting: Emergency Medicine

## 2019-11-19 ENCOUNTER — Other Ambulatory Visit: Payer: Self-pay

## 2019-11-19 ENCOUNTER — Ambulatory Visit (INDEPENDENT_AMBULATORY_CARE_PROVIDER_SITE_OTHER): Payer: Medicare Other | Admitting: Emergency Medicine

## 2019-11-19 VITALS — BP 124/64 | HR 62 | Temp 98.0°F | Ht 63.0 in | Wt 125.0 lb

## 2019-11-19 DIAGNOSIS — J479 Bronchiectasis, uncomplicated: Secondary | ICD-10-CM | POA: Diagnosis not present

## 2019-11-19 DIAGNOSIS — J449 Chronic obstructive pulmonary disease, unspecified: Secondary | ICD-10-CM

## 2019-11-19 DIAGNOSIS — J301 Allergic rhinitis due to pollen: Secondary | ICD-10-CM

## 2019-11-19 DIAGNOSIS — I2510 Atherosclerotic heart disease of native coronary artery without angina pectoris: Secondary | ICD-10-CM

## 2019-11-19 DIAGNOSIS — Z23 Encounter for immunization: Secondary | ICD-10-CM | POA: Diagnosis not present

## 2019-11-19 NOTE — Assessment & Plan Note (Signed)
Continue cetirizine, fluticasone nose spray as you have been using it

## 2019-11-19 NOTE — Progress Notes (Signed)
Subjective:    Patient ID: [name], female    DOB: 05/06/1934, 85 y.o.   MRN: [id number]  HPI 85 yo woman, never smoker, hx sarcoidosis that was dx by Dr Lucia Gaskins in [place] after FOB (? Whether she had biopsies or not), PSVT, GERD, chronic rhinitis/allergies. Severe obstruction identified by pulmonary function testing 2015. She has had pneumonias in the past, left with some parenchymal distortion and bronchiectasis, including in October 2020 and this hospitalization from 07/28-08/06/2019. She was found to have atrial fibrillation with RVR during that hospitalization as well. Seemed to start as sinus drainage and pain.  She was treated with antibiotics as an outpt prior to the admission, then abx and corticosteroids inpt.  Currently managed on Dulera, has albuterol that she uses approximately few times a week. She believes the [facility] helps her  Lots of persistent nasal gtt, on zyrtec, flonase reliably, on mucinex bid.  No aspiration sx.  She has a flutter, uses daily.  She is on omeprazole 40mg  bid w good reflux control  CT chest 09/18/2019 reviewed by me, showed no evidence of PE, right middle lobe bronchiectasis, right upper lobe chronic scar, some new bilateral basilar airspace opacity concerning for pneumonia, new compared with her abdominal CT 07/29/2019  ROV 10/30/19 --follow-up visit for 85 year old never smoker with her sarcoidosis.  Also with SVT, GERD, chronic rhinitis.  She has severe obstructive lung disease by pulmonary function testing currently managed on Dulera.  She was recently treated for pneumonia, hospitalized.  She has associated lower lobe infiltrates and bronchiectasis on CT chest 09/18/2019. We arranged for CT chest, scheduled for 9/14. She has been having increased LE edema for the last several weeks since our OV. She remains on mucinex, cetirizine, fluticasone. She has flutter, uses daily  ROV 11/19/19 --85 year old woman, never smoker with sarcoidosis, SVT, chronic  rhinitis, GERD.  Her pulmonary function testing is consistent with severe obstructive lung disease.  She was hospitalized for pneumonia in July, had lower lobe bilateral infiltrates and associated bronchiectasis on CT chest 09/18/2019.  Has been managed on Mucinex, cetirizine, fluticasone.  She uses flutter valve daily.  I saw her 3 weeks ago at which time she had some increased lower extremity edema.  I treated her with a brief course of Lasix 20 mg daily x 3 days. We changed her Dulera to Symbicort due to insurance reasons. She is also on atrovent bid. She still has a little LE edema. Her breathing is stable on the symbicort. She wonders about stopping the atrovent. Dr Dagmar Hait has started her on lasix prn.   A repeat CT scan of the chest was done 11/05/2019 which I have reviewed, shows interval resolution of her bilateral basilar consolidation with some residual bandlike scarring in the right lung base, diffuse bilateral bronchial wall thickening  Review of Systems  Constitutional: Negative for fever and unexpected weight change.  HENT: Negative for congestion, dental problem, ear pain, nosebleeds, postnasal drip, rhinorrhea, sinus pressure, sneezing, sore throat and trouble swallowing.   Eyes: Negative for redness and itching.  Respiratory: Negative for cough, chest tightness, shortness of breath and wheezing.   Cardiovascular: Negative for palpitations and leg swelling.  Gastrointestinal: Negative for nausea and vomiting.  Genitourinary: Negative for dysuria.  Musculoskeletal: Negative for joint swelling.  Skin: Negative for rash.  Neurological: Negative for headaches.  Hematological: Does not bruise/bleed easily.  Psychiatric/Behavioral: Negative for dysphoric mood. The patient is not nervous/anxious.       Objective:   Physical Exam  Vitals:   11/19/19 1619  BP: 124/64  Pulse: 62  Temp: 98 F (36.7 C)  TempSrc: Temporal  SpO2: 98%  Weight: 125 lb (56.7 kg)  Height: 5\' 3"  (1.6 m)    Gen: Pleasant, kyphotic elderly woman, in no distress,  normal affect  ENT: No lesions,  mouth clear,  oropharynx clear, no postnasal drip  Neck: No JVD, no stridor  Lungs: No use of accessory muscles, L> R basilar insp crackles, no wheezes.   Cardiovascular: RRR, heart sounds normal, no murmur or gallops, 1+ LE ankle peripheral edema  Musculoskeletal: No deformities, no cyanosis or clubbing  Neuro: alert, non focal  Skin: Warm, no lesions or rashes       Assessment & Plan:  COPD (chronic obstructive pulmonary disease) (HCC) We will continue Symbicort 2 puffs twice a day as a replacement for your Dulera.  Rinse and gargle after using. Keep your albuterol available to use 2 puffs when you need it for shortness of breath, chest tightness, wheezing. Stop your Atrovent inhaler.  Call us if your breathing changes or if you miss it we can talk about whether to restart. Flu shot today Get your COVID-19 booster shot in October Follow Dr. [name] in December or sooner if you have any problems.  Bronchiectasis without complication ([place]) Continue your Mucinex Continue your flutter valve daily  Allergic rhinitis Continue cetirizine, fluticasone nose spray as you have been using it  Baltazar Apo, MD, PhD 11/19/2019, 4:49 PM [facility] [phone] or if no answer [phone]

## 2019-11-19 NOTE — Patient Instructions (Signed)
We will continue Symbicort 2 puffs twice a day as a replacement for your [facility].  Rinse and gargle after using. Keep your albuterol available to use 2 puffs when you need it for shortness of breath, chest tightness, wheezing. Stop your Atrovent inhaler.  Call us if your breathing changes or if you miss it we can talk about whether to restart. Continue your Mucinex, cetirizine, fluticasone nose spray as you have been using it Continue your flutter valve daily Flu shot today Get your COVID-19 booster shot in October Follow Dr. [name] in December or sooner if you have any problems.

## 2019-11-19 NOTE — Assessment & Plan Note (Signed)
We will continue Symbicort 2 puffs twice a day as a replacement for your [facility].  Rinse and gargle after using. Keep your albuterol available to use 2 puffs when you need it for shortness of breath, chest tightness, wheezing. Stop your Atrovent inhaler.  Call us if your breathing changes or if you miss it we can talk about whether to restart. Flu shot today Get your COVID-19 booster shot in October Follow Dr. [name] in December or sooner if you have any problems.

## 2019-11-19 NOTE — Assessment & Plan Note (Signed)
Continue your Mucinex Continue your flutter valve daily

## 2019-11-26 ENCOUNTER — Other Ambulatory Visit: Payer: Self-pay

## 2019-11-26 ENCOUNTER — Other Ambulatory Visit: Payer: Medicare Other | Admitting: *Deleted

## 2019-11-26 DIAGNOSIS — I480 Paroxysmal atrial fibrillation: Secondary | ICD-10-CM

## 2019-11-26 DIAGNOSIS — I10 Essential (primary) hypertension: Secondary | ICD-10-CM

## 2019-11-26 LAB — BASIC METABOLIC PANEL
BUN/Creatinine Ratio: 25 (ref 12–28)
BUN: 20 mg/dL (ref 8–27)
CO2: 24 mmol/L (ref 20–29)
Calcium: 9.7 mg/dL (ref 8.7–10.3)
Chloride: 102 mmol/L (ref 96–106)
Creatinine, Ser: 0.81 mg/dL (ref 0.57–1.00)
GFR calc Af Amer: 78 mL/min/{1.73_m2} (ref >59)
GFR calc non Af Amer: 67 mL/min/{1.73_m2} (ref >59)
Glucose: 156 mg/dL — ABNORMAL HIGH (ref 65–99)
Potassium: 4.3 mmol/L (ref 3.5–5.2)
Sodium: 139 mmol/L (ref 134–144)

## 2019-11-26 LAB — CBC
Hematocrit: 39 % (ref 34.0–46.6)
Hemoglobin: 12.7 g/dL (ref 11.1–15.9)
MCH: 31.3 pg (ref 26.6–33.0)
MCHC: 32.6 g/dL (ref 31.5–35.7)
MCV: 96 fL (ref 79–97)
Platelets: 237 10*3/uL (ref 150–450)
RBC: 4.06 x10E6/uL (ref 3.77–5.28)
RDW: 12.7 % (ref 11.7–15.4)
WBC: 7.9 10*3/uL (ref 3.4–10.8)

## 2019-12-09 ENCOUNTER — Ambulatory Visit (INDEPENDENT_AMBULATORY_CARE_PROVIDER_SITE_OTHER): Payer: Medicare Other | Admitting: Podiatry

## 2019-12-09 ENCOUNTER — Encounter: Payer: Self-pay | Admitting: Podiatry

## 2019-12-09 ENCOUNTER — Other Ambulatory Visit: Payer: Self-pay

## 2019-12-09 DIAGNOSIS — G629 Polyneuropathy, unspecified: Secondary | ICD-10-CM | POA: Diagnosis not present

## 2019-12-09 DIAGNOSIS — M722 Plantar fascial fibromatosis: Secondary | ICD-10-CM

## 2019-12-09 DIAGNOSIS — I2510 Atherosclerotic heart disease of native coronary artery without angina pectoris: Secondary | ICD-10-CM

## 2019-12-10 NOTE — Progress Notes (Signed)
Subjective:   Patient ID: [name], female   DOB: 85 y.o.   MRN: [id number]   HPI Patient presents stating she is getting some burning in both of her feet that she is concerned about and also is developing pain in the plantar of her right heel and states it sore when she gets up in the morning and at night it seems to throb   ROS      Objective:  Physical Exam  Neurovascular status was found to be intact with patient noted to have pain in the plantar aspect of the right heel and does state when I checked that there is a diminishment of sharp dull vibratory     Assessment:  Acute plantar fasciitis right with inflammation with the possibility for neuro logical issues which may be related to low-grade neuropathy of an idiopathic nature or other pathology     Plan:  H&P conditions reviewed and today I did discuss neuropathy educating her on this and we will start her on high-dose vitamins.  I went ahead I did sterile prep and injected the fascia 3 mg Kenalog 5 mg Xylocaine which was tolerated well and patient will be seen back to recheck  X-rays indicate no indications of stress fracture or advanced arthritis currently with poor forefoot structure

## 2019-12-10 NOTE — Progress Notes (Deleted)
Cardiology Office Note:    Date:  12/10/2019   ID:  [name], DOB 12-Mar-1934, MRN [id number]  PCP:  Chilton Greathouse, MD  [facility] Cardiologist:  Tonny Bollman, MD  [facility] Electrophysiologist:  None   Referring MD: Chilton Greathouse, MD   Chief Complaint: *** No chief complaint on file.    Patient Profile:    [name] is a 85 y.o. female with:   Coronary artery disease ? Cath 3/17: oLCx 60-70 (neg by FFR) ? Myoview 11/16: no ischemia ? Myoview 07/2018: EF 73, prob low risk (significant extracardiac uptake), no ischemia  Paroxysmal atrial fibrillation  ? CHA2DS2-VASc=5 (CAD, HTN, age x 2, female) >> Apixaban 2.5 mg (> 89 yo, wt <60 kg)   Pulmonary sarcoidosis  Bronchiectasis  Hypertension   Hyperlipidemia   SVT  Carotid stenosis ? [place] 06/2018: bilat 1-39  Prior CV studies: Echocardiogram 09/23/19 EF 55-60, GR 1 DD, RVSP 24.2, trivial MR  Echocardiogram 07/19/2019 EF 55-60, mild LVH, GR 1 DD, RVSP 36.9, trivial MR  Myoview 08/02/2018 EF: 73%, sig extracardiac uptake at rest, prob low risk  Carotid US 07/09/2018 Bilat ICA 1-39  Carotid US 10/17 bilat ICA 1-39>> FUprn  LHC 05/20/15 LAD plaque - nonobstructive LCx ost 70% >> FFR 0.99 > 0.95 (not hemodynamically significant); mid 40% RCA mid 30% EF 55-65%  Myoview 11/16 EF 75%, no ischemia, low risk  High Resolution CT 12/02/14 IMPRESSION: 1. No findings to suggest interstitial lung disease. 2. The overall appearance the chest is very similar to the prior study 11/28/2012. Findings are nonspecific, but could be seen in the setting of reported sarcoidosis, as detailed above. 3. Areas of bronchiectasis are noted in the right lung, most severe in the medial segment of the right middle lobe where there are areas of cystic bronchiectasis and architectural distortion. 4. Atherosclerosis, including left main and 3 vessel coronary artery disease. Assessment for potential risk  factor modification, dietary therapy or pharmacologic therapy may be warranted, if clinically indicated. 5. Additional incidental findings, as above.  Myoview 10/15 Normal stress nuclear study.LV Ejection Fraction: 70%.  Echo 10/15 EF 60-65%, no RWMA  Carotid US 9/15 Bilateral 1-39% ICA >> FU 2 years   History of Present Illness:    [name] was last seen in 9/21.  She had symptoms of bilateral leg edema which I thought was due mainly to venous insufficiency.  I asked her to elevated her legs and use compression hose.  I gave her Lasix to take for a few days, then prn.  On labs, her renal function, protein, albumin, Hgb and NT-Pro BNP were all normal.  TSH was previously normal in 8/21.  She returns for follow up. ***   Past Medical History:  Diagnosis Date  . Allergy    SEASONAL  . Anemia   . Arthritis   . Asthma    "slight"   . Baker's cyst of knee, right   . CAD (coronary artery disease)    a. Myoview 10/15 - normal EF 70% // Myoview 11/16: EF 75%, normal perfusion, (there was no blood pressure drop at low level exercise with Lexiscan infusion), low risk study // c. LHC 3/17 - LAD irregs, oLCx 70 (neg FFR), mRCA 30, EF 55-65% >> med Rx // Myoview 07/2018:  EF 73, extracardiac uptake, no significant ischemia (reviewed with Dr. Excell Seltzer), Low Risk   . Carotid stenosis    a. Carotid US 9/15 - Bilateral 1-39% ICA >> FU 2 years //  b. Bilateral ICA 1-39 >> FU prn // Carotid US 06/2018: bilat 1-39; fu prn  . Dyspnea    due to Sarcoidosis. When is windy or humed  . Dysrhythmia    fast heart rate  . Elbow fracture 04/2017   Right, had surgery  . Esophageal reflux   . Hematochezia   . History of echocardiogram    a. Echo 10/15 - EF 60-65%, no RWMA  . HLD (hyperlipidemia)   . Hx of colonoscopy   . Osteoporosis   . Other chronic pulmonary heart diseases   . Paroxysmal supraventricular tachycardia ([place])   . Pneumonia   . Sarcoidosis     Current Medications: No outpatient  medications have been marked as taking for the 12/11/19 encounter (Appointment) with [name] T, PA-C.     Allergies:   Levaquin [levofloxacin], Sulfonamide derivatives, Oxycodone, and Morphine   Social History   Tobacco Use  . Smoking status: Never Smoker  . Smokeless tobacco: Never Used  Vaping Use  . Vaping Use: Never used  Substance Use Topics  . Alcohol use: No  . Drug use: No     Family Hx: The patient's family history includes Arrhythmia in her father; Cancer in her father, mother, and sister; Colon cancer (age of onset: 99) in her mother; Heart attack in her brother; Heart disease in her father; Lung cancer in her father. There is no history of Esophageal cancer, Rectal cancer, Stomach cancer, or Stroke.  Review of Systems  See HPI  EKGs/Labs/Other Test Reviewed:    EKG:  EKG is ***  ordered today.  The ekg ordered today demonstrates ***   Recent Labs: 07/20/2019: B Natriuretic Peptide 273.6 09/23/2019: TSH 0.787 09/26/2019: Magnesium 2.3 11/07/2019: ALT 7; NT-Pro BNP 520 11/26/2019: BUN 20; Creatinine, Ser 0.81; Hemoglobin 12.7; Platelets 237; Potassium 4.3; Sodium 139   Recent Lipid Panel Lab Results  Component Value Date/Time   CHOL 149 11/27/2017 08:00 AM   TRIG 67 11/27/2017 08:00 AM   HDL 73 11/27/2017 08:00 AM   CHOLHDL 2.0 11/27/2017 08:00 AM   CHOLHDL 2.0 11/03/2015 07:42 AM   LDLCALC 63 11/27/2017 08:00 AM    Physical Exam:    VS:  LMP 02/22/1983 (Approximate)     Wt Readings from Last 3 Encounters:  11/19/19 125 lb (56.7 kg)  11/06/19 122 lb 6.4 oz (55.5 kg)  10/30/19 123 lb (55.8 kg)     Physical Exam ***  ASSESSMENT & PLAN:    ***   1. Leg swelling I suspect her lower extremity swelling is related to venous insufficiency.  She does have some evidence of varicose veins on exam.  She has been on diltiazem for a long time.  So I do not think her swelling is related to calcium channel blocker therapy.  She had normal LV function and mild  diastolic function and echocardiogram in August 2021.  Her RVSP was normal at that time.  I will give her furosemide to take 20 mg daily for 3 days.  She should only take this as needed after 3 days.  I have recommended leg elevation as well as compression stockings.  Have also encouraged increased activity.  I will obtain a c-Met, CBC and BNP today.  Follow-up with me in 4 weeks.  2. Paroxysmal atrial fibrillation (HCC) Maintaining sinus rhythm.  Continue anticoagulation with Apixaban.  Continue diltiazem for rate control.  3. Coronary artery disease involving native coronary artery of native heart without angina pectoris Moderate circumflex disease by  catheterization 2017.  Myoview was low risk in 2020.  She is not having anginal symptoms.  She is not on aspirin as she is now on Apixaban.  Continue rosuvastatin.  4. Essential hypertension The patient's blood pressure is controlled on her current regimen.  Continue current therapy.   5. SARCOIDOSIS, PULMONARY Continue follow-up with pulmonology     Dispo:  No follow-ups on file.   Medication Adjustments/Labs and Tests Ordered: Current medicines are reviewed at length with the patient today.  Concerns regarding medicines are outlined above.  Tests Ordered: No orders of the defined types were placed in this encounter.  Medication Changes: No orders of the defined types were placed in this encounter.   Signed, [name], PA-C  12/10/2019 4:59 PM    [facility] [place], [place], [place]  [address] Phone: [phone]; Fax: [phone]

## 2019-12-11 ENCOUNTER — Ambulatory Visit: Payer: Medicare Other | Admitting: Physician Assistant

## 2019-12-11 DIAGNOSIS — I10 Essential (primary) hypertension: Secondary | ICD-10-CM

## 2019-12-11 DIAGNOSIS — I2510 Atherosclerotic heart disease of native coronary artery without angina pectoris: Secondary | ICD-10-CM

## 2019-12-11 DIAGNOSIS — I480 Paroxysmal atrial fibrillation: Secondary | ICD-10-CM

## 2019-12-11 DIAGNOSIS — D869 Sarcoidosis, unspecified: Secondary | ICD-10-CM

## 2019-12-11 DIAGNOSIS — I872 Venous insufficiency (chronic) (peripheral): Secondary | ICD-10-CM

## 2019-12-12 ENCOUNTER — Ambulatory Visit: Payer: Medicare Other | Attending: Internal Medicine

## 2019-12-12 DIAGNOSIS — Z23 Encounter for immunization: Secondary | ICD-10-CM

## 2019-12-12 NOTE — Progress Notes (Signed)
   Covid-19 Vaccination Clinic  Name:  [name]    MRN: [id number] DOB: [birth year]  12/12/2019  [name] was observed post Covid-19 immunization for 15 minutes without incident. She was provided with Vaccine Information Sheet and instruction to access the V-Safe system.   [name] was instructed to call 911 with any severe reactions post vaccine: Marland Kitchen Difficulty breathing  . Swelling of face and throat  . A fast heartbeat  . A bad rash all over body  . Dizziness and weakness

## 2019-12-23 ENCOUNTER — Telehealth: Payer: Self-pay | Admitting: Cardiovascular Disease

## 2019-12-23 DIAGNOSIS — I480 Paroxysmal atrial fibrillation: Secondary | ICD-10-CM

## 2019-12-23 NOTE — Telephone Encounter (Signed)
Pt c/o medication issue:  1. Name of Medication: apixaban (ELIQUIS) 2.5 MG TABS tablet  2. How are you currently taking this medication (dosage and times per day)? Took the last one this morning  3. Are you having a reaction (difficulty breathing--STAT)? no  4. What is your medication issue? Patient states that this medication is too expensive. She took the last dose this morning but wants to know if something else can be called in. Please advise.

## 2019-12-23 NOTE — Telephone Encounter (Signed)
**Note De-Identified  Obfuscation** After discussing all cost options of anticoagulant medications currently on the market the pt states that she wants to switch to Warfarin due to cost.  We did briefly discuss Warfarin and what is involved while taking and she states that monthly finger sticks would n0t be a problem for her.  She took her last Eliquis tablet last night. I have advised her that we will leave her 1 bottle of Eliquis 2.5 mg tablets up front at [facility] office for her to pickup and that someone from our Coumadin Clinic will call to discuss her switching from Eliquis to Warfarin due to cost.

## 2019-12-23 NOTE — Telephone Encounter (Signed)
Looks to be [name], [facility] prescribed Eliquis

## 2019-12-23 NOTE — Telephone Encounter (Signed)
One box of Eliquis 2.5mg  left up front for the patient.

## 2019-12-24 MED ORDER — APIXABAN 2.5 MG PO TABS: 2.5000 mg | ORAL_TABLET | Freq: Two times a day (BID) | ORAL | 1 refills | Status: DC

## 2019-12-24 NOTE — Addendum Note (Signed)
Addended by: Allen Egerton E on: 12/24/2019 11:29 AM   Modules accepted: Orders

## 2019-12-24 NOTE — Telephone Encounter (Signed)
Called pt to discuss. She states a mail order supply of Eliquis at her mail order pharmacy for 90 day supply is more affordable at ~$200 and she wishes to stay on Eliquis. I have sent in a refill per pt request. Will also leave a few more samples up front for pt since she is not sure that mail order rx will come within the next week and she only picked up 1 week's worth of samples yesterday. Advised pt that we have 5mg  tablet strength so she should cut tablets in half and take 1/2 tablet twice daily to equal her current dose of 2.5mg  BID. Will also write these instructions on the sample box for pt. She verbalized understanding and was appreciative for the help.

## 2019-12-28 ENCOUNTER — Other Ambulatory Visit: Payer: Self-pay | Admitting: Physician Assistant

## 2020-01-10 ENCOUNTER — Other Ambulatory Visit (HOSPITAL_COMMUNITY): Payer: Self-pay | Admitting: *Deleted

## 2020-01-13 ENCOUNTER — Other Ambulatory Visit: Payer: Self-pay

## 2020-01-13 ENCOUNTER — Ambulatory Visit (HOSPITAL_COMMUNITY)
Admission: RE | Admit: 2020-01-13 | Discharge: 2020-01-13 | Disposition: A | Discharge status: Home / Self Care | Payer: Medicare Other | Source: Ambulatory Visit | Attending: Internal Medicine | Admitting: Internal Medicine

## 2020-01-13 DIAGNOSIS — M810 Age-related osteoporosis without current pathological fracture: Secondary | ICD-10-CM | POA: Diagnosis not present

## 2020-01-13 MED ORDER — DENOSUMAB 60 MG/ML ~~LOC~~ SOSY
PREFILLED_SYRINGE | SUBCUTANEOUS | Status: AC
  Filled 2020-01-13: qty 1

## 2020-01-13 MED ORDER — DENOSUMAB 60 MG/ML ~~LOC~~ SOSY
60.0000 mg | PREFILLED_SYRINGE | Freq: Once | SUBCUTANEOUS | Status: AC
  Administered 2020-01-13: 60 mg via SUBCUTANEOUS

## 2020-01-14 NOTE — Progress Notes (Signed)
Cardiology Office Note:    Date:  01/15/2020   ID:  [name], DOB 04-Jul-1934, MRN [id number]  PCP:  [name], MD  [facility] Cardiologist:  Sherren Mocha, MD   [facility] Electrophysiologist:  None   Referring MD: [name], MD   Chief Complaint:  Follow-up (AFib, leg edema)    Patient Profile:    [name] is a 85 y.o. female with:   Coronary artery disease ? Cath 3/17: oLCx 60-70 (neg by FFR) ? Myoview 11/16: no ischemia ? Myoview 07/2018: EF 73, prob low risk (significant extracardiac uptake), no ischemia  Paroxysmal atrial fibrillation ? CHA2DS2-VASc=5 (CAD, HTN, age x 2, female) >> Apixaban 2.5 mg (> 73 yo, wt <60 kg)  Pulmonary sarcoidosis  Bronchiectasis  Hypertension   Hyperlipidemia   SVT  Carotid stenosis ? [place] 06/2018: bilat 1-39  Prior CV studies: Echocardiogram 09/23/19 EF 55-60, GR 1 DD, RVSP 24.2, trivial MR  Echocardiogram 07/19/2019 EF 55-60, mild LVH, GR 1 DD, RVSP 36.9, trivial MR  Myoview 08/02/2018 EF: 73%, sig extracardiac uptake at rest, prob low risk  Carotid US 07/09/2018 Bilat ICA 1-39  Carotid US 10/17 bilat ICA 1-39>> FUprn  LHC 05/20/15 LAD plaque - nonobstructive LCx ost 70% >> FFR 0.99 > 0.95 (not hemodynamically significant); mid 40% RCA mid 30% EF 55-65%  Myoview 11/16 EF 75%, no ischemia, low risk  High Resolution CT 12/02/14 IMPRESSION: 1. No findings to suggest interstitial lung disease. 2. The overall appearance the chest is very similar to the prior study 11/28/2012. Findings are nonspecific, but could be seen in the setting of reported sarcoidosis, as detailed above. 3. Areas of bronchiectasis are noted in the right lung, most severe in the medial segment of the right middle lobe where there are areas of cystic bronchiectasis and architectural distortion. 4. Atherosclerosis, including left main and 3 vessel coronary artery disease. Assessment for potential risk factor  modification, dietary therapy or pharmacologic therapy may be warranted, if clinically indicated. 5. Additional incidental findings, as above.  Myoview 10/15 Normal stress nuclear study.LV Ejection Fraction: 70%.  Echo 10/15 EF 60-65%, no RWMA  Carotid US 9/15 Bilateral 1-39% ICA >> FU 2 years   History of Present Illness:    [name] was last seen 11/06/2019.  She had been admitted to the hospital in July with community-acquired pneumonia.  Since that time, she has been experiencing lower extremity swelling.  I gave her furosemide to take as needed and recommended leg elevation as well as compression stockings.  Creatinine, hemoglobin, TSH, BNP, albumin were all normal.  She returns for f/u.  She is here alone.  She has been doing well.  Her leg edema has resolved.  She has not had chest pain, shortness of breath, syncope.        Past Medical History:  Diagnosis Date  . Allergy    SEASONAL  . Anemia   . Arthritis   . Asthma    "slight"   . Baker's cyst of knee, right   . CAD (coronary artery disease)    a. Myoview 10/15 - normal EF 70% // Myoview 11/16: EF 75%, normal perfusion, (there was no blood pressure drop at low level exercise with Lexiscan infusion), low risk study // c. LHC 3/17 - LAD irregs, oLCx 70 (neg FFR), mRCA 30, EF 55-65% >> med Rx // Myoview 07/2018:  EF 73, extracardiac uptake, no significant ischemia (reviewed with Dr. Burt Knack), Low Risk   . Carotid stenosis  a. Carotid US 9/15 - Bilateral 1-39% ICA >> FU 2 years // b. Bilateral ICA 1-39 >> FU prn // Carotid US 06/2018: bilat 1-39; fu prn  . Dyspnea    due to Sarcoidosis. When is windy or humed  . Dysrhythmia    fast heart rate  . Elbow fracture 04/2017   Right, had surgery  . Esophageal reflux   . Hematochezia   . History of echocardiogram    a. Echo 10/15 - EF 60-65%, no RWMA  . HLD (hyperlipidemia)   . Hx of colonoscopy   . Osteoporosis   . Other chronic pulmonary heart diseases   . Paroxysmal  supraventricular tachycardia ([place])   . Pneumonia   . Sarcoidosis     Current Medications: Current Meds  Medication Sig  . acetaminophen (TYLENOL) 500 MG tablet Take 1,000 mg by mouth every 6 (six) hours as needed for mild pain.   Marland Kitchen albuterol (VENTOLIN HFA) 108 (90 Base) MCG/ACT inhaler Inhale 2 puffs into the lungs every 4 (four) hours as needed for wheezing or shortness of breath.  Marland Kitchen apixaban (ELIQUIS) 2.5 MG TABS tablet Take 1 tablet (2.5 mg total) by mouth 2 (two) times daily.  . benzonatate (TESSALON) 100 MG capsule Take 1 capsule (100 mg total) by mouth every 6 (six) hours as needed for cough.  . budesonide-formoterol (SYMBICORT) 160-4.5 MCG/ACT inhaler Inhale 2 puffs into the lungs in the morning and at bedtime.  . budesonide-formoterol (SYMBICORT) 160-4.5 MCG/ACT inhaler Symbicort 160 mcg-4.5 mcg/actuation HFA aerosol inhaler  INHALE 2 PUFFS INTO THE LUNGS IN THE MORNING AND AT BEDTIME  . calcium carbonate (TUMS - DOSED IN MG ELEMENTAL CALCIUM) 500 MG chewable tablet Chew 1 tablet by mouth daily as needed for indigestion.   . cetirizine (ZYRTEC) 10 MG tablet Take 10 mg by mouth daily.  Marland Kitchen denosumab (PROLIA) 60 MG/ML SOLN injection Inject 60 mg into the skin every 6 (six) months. Administer in upper arm, thigh, or abdomen  . dextromethorphan (DELSYM) 30 MG/5ML liquid Take 30 mg by mouth 2 (two) times daily as needed for cough.  . diltiazem (CARDIZEM CD) 180 MG 24 hr capsule Take 180 mg by mouth at bedtime.   . docusate sodium (COLACE) 50 MG capsule Take 100 mg by mouth at bedtime.   . fluticasone (FLONASE) 50 MCG/ACT nasal spray Place 2 sprays into both nostrils 2 (two) times daily.  . furosemide (LASIX) 20 MG tablet Take one tablet daily as needed for swelling  . ipratropium (ATROVENT HFA) 17 MCG/ACT inhaler Inhale 1 puff into the lungs 2 (two) times daily.  . melatonin 5 MG TABS Take 10 mg by mouth at bedtime.   . nitroGLYCERIN (NITROSTAT) 0.4 MG SL tablet Place 1 tablet (0.4 mg  total) under the tongue every 5 (five) minutes as needed for chest pain.  Marland Kitchen omeprazole (PRILOSEC) 40 MG capsule Take 40 mg by mouth 2 (two) times daily.   . ondansetron (ZOFRAN-ODT) 4 MG disintegrating tablet Take 4 mg by mouth every 4 (four) hours as needed for nausea or vomiting.   Marland Kitchen oxyCODONE-acetaminophen (PERCOCET) 5-325 MG tablet Take 1 tablet by mouth every 4 (four) hours as needed (max 6 q for post op pain).  . polyethylene glycol (MIRALAX / GLYCOLAX) 17 g packet Take 17 g by mouth daily.  . rosuvastatin (CRESTOR) 10 MG tablet Take 1 tablet (10 mg total) by mouth 3 (three) times a week. Monday, Wednesday and Friday.  . Vitamin D, Ergocalciferol, (DRISDOL) 50000 units CAPS capsule  Take 50,000 Units by mouth every Friday.      Allergies:   Levaquin [levofloxacin], Sulfonamide derivatives, Oxycodone, and Morphine   Social History   Tobacco Use  . Smoking status: Never Smoker  . Smokeless tobacco: Never Used  Vaping Use  . Vaping Use: Never used  Substance Use Topics  . Alcohol use: No  . Drug use: No     Family Hx: The patient's family history includes Arrhythmia in her father; Cancer in her father, mother, and sister; Colon cancer (age of onset: 84) in her mother; Heart attack in her brother; Heart disease in her father; Lung cancer in her father. There is no history of Esophageal cancer, Rectal cancer, Stomach cancer, or Stroke.  Review of Systems  Gastrointestinal: Negative for hematochezia and melena.     EKGs/Labs/Other Test Reviewed:    EKG:  EKG is not ordered today.  The ekg ordered today demonstrates n/a  Recent Labs: 07/20/2019: B Natriuretic Peptide 273.6 09/23/2019: TSH 0.787 09/26/2019: Magnesium 2.3 11/07/2019: ALT 7; NT-Pro BNP 520 11/26/2019: BUN 20; Creatinine, Ser 0.81; Hemoglobin 12.7; Platelets 237; Potassium 4.3; Sodium 139   Recent Lipid Panel Lab Results  Component Value Date/Time   CHOL 149 11/27/2017 08:00 AM   TRIG 67 11/27/2017 08:00 AM   HDL 73  11/27/2017 08:00 AM   CHOLHDL 2.0 11/27/2017 08:00 AM   CHOLHDL 2.0 11/03/2015 07:42 AM   LDLCALC 63 11/27/2017 08:00 AM      Risk Assessment/Calculations:     CHA2DS2-VASc Score = 5  This indicates a 7.2% annual risk of stroke. The patient's score is based upon: CHF History: 0 HTN History: 1 Diabetes History: 0 Stroke History: 0 Vascular Disease History: 1 Age Score: 2 Gender Score: 1     Physical Exam:    VS:  BP (!) 114/52   Pulse 76   Ht 5\' 3"  (1.6 m)   Wt 122 lb (55.3 kg)   LMP 02/22/1983 (Approximate)   SpO2 94%   BMI 21.61 kg/m     Wt Readings from Last 3 Encounters:  01/15/20 122 lb (55.3 kg)  11/19/19 125 lb (56.7 kg)  11/06/19 122 lb 6.4 oz (55.5 kg)     Constitutional:      Appearance: Healthy appearance. Not in distress.  Pulmonary:     Effort: Pulmonary effort is normal.     Breath sounds: No wheezing. No rales.  Cardiovascular:     Normal rate. Regular rhythm. Normal S1. Normal S2.     Murmurs: There is no murmur.  Edema:    Peripheral edema absent.  Musculoskeletal:     Cervical back: Neck supple. Skin:    General: Skin is warm and dry.  Neurological:     Mental Status: Alert and oriented to person, place and time.     Cranial Nerves: Cranial nerves are intact.       ASSESSMENT & PLAN:    1. Paroxysmal atrial fibrillation (HCC) She appears to be maintaining normal sinus rhythm by exam.  She is tolerating anticoagulation.  She has trouble affording Eliquis.  Her Rx drug plan will change after Jan 1.  I have asked her to check to see if Rivaroxaban is cheaper. If so, we can switch to that. Or, we can change to Warfarin if she cannot afford either.  Recent Hgb, Creatinine stable.  Leg edema has resolved.  Continue current medications.  FU in 6 mos.       Dispo:  Return in about 6 months (  around 07/14/2020) for Routine Follow Up, w/ [name], PA-C, in person.   Medication Adjustments/Labs and Tests Ordered: Current medicines are  reviewed at length with the patient today.  Concerns regarding medicines are outlined above.  Tests Ordered: No orders of the defined types were placed in this encounter.  Medication Changes: No orders of the defined types were placed in this encounter.   Signed, [name], PA-C  01/15/2020 2:13 PM    [facility] [place], [place], [place]  [address] Phone: [phone]; Fax: [phone]

## 2020-01-15 ENCOUNTER — Ambulatory Visit (INDEPENDENT_AMBULATORY_CARE_PROVIDER_SITE_OTHER): Payer: Medicare Other | Admitting: Physician Assistant

## 2020-01-15 ENCOUNTER — Other Ambulatory Visit: Payer: Self-pay

## 2020-01-15 ENCOUNTER — Encounter: Payer: Self-pay | Admitting: Physician Assistant

## 2020-01-15 VITALS — BP 114/52 | HR 76 | Ht 63.0 in | Wt 122.0 lb

## 2020-01-15 DIAGNOSIS — I480 Paroxysmal atrial fibrillation: Secondary | ICD-10-CM

## 2020-01-15 DIAGNOSIS — I2510 Atherosclerotic heart disease of native coronary artery without angina pectoris: Secondary | ICD-10-CM

## 2020-01-15 DIAGNOSIS — I10 Essential (primary) hypertension: Secondary | ICD-10-CM

## 2020-01-15 DIAGNOSIS — R600 Localized edema: Secondary | ICD-10-CM

## 2020-01-15 NOTE — Patient Instructions (Signed)
Medication Instructions:  Your physician recommends that you continue on your current medications as directed. Please refer to the Current Medication list given to you today.  *If you need a refill on your cardiac medications before your next appointment, please call your pharmacy*  Lab Work: None ordered today  Testing/Procedures: None ordered today  Follow-Up: At [facility], you and your health needs are our priority.  As part of our continuing mission to provide you with exceptional heart care, we have created designated Provider Care Teams.  These Care Teams include your primary Cardiologist (physician) and Advanced Practice Providers (APPs -  Physician Assistants and Nurse Practitioners) who all work together to provide you with the care you need, when you need it.  Your next appointment:   6 month(s)  The format for your next appointment:   In Person  Provider:   You may see Sherren Mocha, MD or [name], PA-C  Other Instructions Check with your insurance about Xarelto if Eliquis is still expensive after February 22, 2020

## 2020-01-27 ENCOUNTER — Telehealth: Payer: Self-pay | Admitting: Cardiology

## 2020-01-27 NOTE — Telephone Encounter (Signed)
Pt should continue on Eliquis for crown prep.

## 2020-01-27 NOTE — Telephone Encounter (Signed)
Pharmacy, I know we typically don't recommend holding anticoagulation for simple dental extractions. So I am assuming it should not have to be held for crown prep but just wanted to make sure?  Thank you!

## 2020-01-27 NOTE — Telephone Encounter (Signed)
Dentist office does not have a working fax number right now. Called and spoke with dentist office and notified them that Eliquis does NOT need to be held for crown prep. They voiced understanding and said they could take a verbal recommendation. Will remove from pre-op pool.  Darreld Mclean, PA-C 01/27/2020 3:46 PM

## 2020-01-27 NOTE — Telephone Encounter (Signed)
   [facility] Pre-operative Risk Assessment    HEARTCARE STAFF: - Please ensure there is not already an duplicate clearance open for this procedure. - Under Visit Info/Reason for Call, type in Other and utilize the format Clearance MM/DD/YY or Clearance TBD. Do not use dashes or single digits. - If request is for dental extraction, please clarify the # of teeth to be extracted.  Request for surgical clearance:  1. What type of surgery is being performed? Crown Prep   2. When is this surgery scheduled? TBD   3. What type of clearance is required (medical clearance vs. Pharmacy clearance to hold med vs. Both)? Pharmacy   Are there any medications that need to be held prior to surgery and how long?apixaban (ELIQUIS) 2.5 MG TABS tablet [[id number] 4.   5. Practice name and name of physician performing surgery? [facility]  6. What is the office phone number? [phone]   7.   What is the office fax number? Dont have a working fax  8.   Anesthesia type (None, local, MAC, general) ? Local    Shana A Stovall 01/27/2020, 1:00 PM  _________________________________________________________________   (provider comments below)

## 2020-03-10 ENCOUNTER — Telehealth (HOSPITAL_COMMUNITY): Payer: Self-pay | Admitting: Internal Medicine

## 2020-03-10 ENCOUNTER — Encounter (HOSPITAL_COMMUNITY): Payer: Self-pay

## 2020-03-10 NOTE — Telephone Encounter (Signed)
Called to discuss with patient about COVID-19 symptoms and the use of one of the available treatments for those with mild to moderate Covid symptoms and at a high risk of hospitalization.  Pt appears to qualify for outpatient treatment due to co-morbid conditions and/or a member of an at-risk group in accordance with the FDA Emergency Use Authorization.    Symptom onset: unknown Vaccinated: unknown Booster? unknown Immunocompromised? no Qualifiers: adv age, CAD, pulm sarcoid, htn  Unable to reach pt and no vm set up. Will send mychart message and ask she reach out if interested in being screened.   Alan Ripper, [place]

## 2020-03-23 ENCOUNTER — Telehealth: Payer: Self-pay | Admitting: Neurology

## 2020-03-23 NOTE — Telephone Encounter (Signed)
Pt. is asking for a call back from RN regarding a sooner appt.

## 2020-03-23 NOTE — Telephone Encounter (Addendum)
Doesn't look like patient completed the formal memory testing. Spoke with Dr Jaynee Eagles. She would have to be seen again and can see Amy or Megan.

## 2020-03-24 NOTE — Telephone Encounter (Signed)
Spoke with patient. She said she wanted to be seen earlier by Dr Jaynee Eagles because she's had a headache for 4 days. Discussed new issue should be evaluated by primary care. Pt verbalized understanding and her questions were answered. I was able to schedule pt for a memory follow up sooner than April with Megan NP for Monday 04/20/20 @ 2:00 pm arrival 1:30 pm.

## 2020-03-25 ENCOUNTER — Ambulatory Visit: Payer: Medicare Other | Admitting: Obstetrics and Gynecology

## 2020-03-25 NOTE — Progress Notes (Deleted)
86 y.o. G1P1001 Widowed White or Caucasian Not Hispanic or Latino female here for annual exam.      Patient's last menstrual period was 02/22/1983 (approximate).          Sexually active: {yes no:314532}  The current method of family planning is post menopausal status.    Exercising: {yes no:314532}  {types:19826} Smoker:  no  Health Maintenance: Pap:   03/22/18 Negative  02/24/16 Negative History of abnormal Pap:  no MMG:  04/16/18 BIRADS 2 benign/density c BMD:   *** Colonoscopy: 09/18/13 f/u only if needed TDaP:  01/14/12 Gardasil: n/a   reports that she has never smoked. She has never used smokeless tobacco. She reports that she does not drink alcohol and does not use drugs.  Past Medical History:  Diagnosis Date  . Allergy    SEASONAL  . Anemia   . Arthritis   . Asthma    "slight"   . Baker's cyst of knee, right   . CAD (coronary artery disease)    a. Myoview 10/15 - normal EF 70% // Myoview 11/16: EF 75%, normal perfusion, (there was no blood pressure drop at low level exercise with Lexiscan infusion), low risk study // c. LHC 3/17 - LAD irregs, oLCx 70 (neg FFR), mRCA 30, EF 55-65% >> med Rx // Myoview 07/2018:  EF 73, extracardiac uptake, no significant ischemia (reviewed with Dr. Burt Knack), Low Risk   . Carotid stenosis    a. Carotid US 9/15 - Bilateral 1-39% ICA >> FU 2 years // b. Bilateral ICA 1-39 >> FU prn // Carotid US 06/2018: bilat 1-39; fu prn  . Dyspnea    due to Sarcoidosis. When is windy or humed  . Dysrhythmia    fast heart rate  . Elbow fracture 04/2017   Right, had surgery  . Esophageal reflux   . Hematochezia   . History of echocardiogram    a. Echo 10/15 - EF 60-65%, no RWMA  . HLD (hyperlipidemia)   . Hx of colonoscopy   . Osteoporosis   . Other chronic pulmonary heart diseases   . Paroxysmal supraventricular tachycardia ([place])   . Pneumonia   . Sarcoidosis     Past Surgical History:  Procedure Laterality Date  . arm surgery Right   . BLADDER  SURGERY    . CARDIAC CATHETERIZATION N/A 05/20/2015   Procedure: Left Heart Cath and Coronary Angiography;  Surgeon: Sherren Mocha, MD;  Location: [facility];  Service: Cardiovascular;  Laterality: N/A;  . CARPAL TUNNEL RELEASE Left   . COLONOSCOPY    . CYSTOSCOPY W/ RETROGRADES Left 07/30/2019   Procedure: CYSTOSCOPY WITH RETROGRADE PYELOGRAM LEFT STENT PLACEMENT;  Surgeon: Ardis Hughs, MD;  Location: [facility];  Service: Urology;  Laterality: Left;  . CYSTOSCOPY WITH RETROGRADE PYELOGRAM, URETEROSCOPY AND STENT PLACEMENT Left 08/20/2019   Procedure: CYSTOSCOPY, URETEROSCOPY AND STENT PLACEMENT;  Surgeon: Robley Fries, MD;  Location: [facility];  Service: Urology;  Laterality: Left;  1 HR  . FOOT SURGERY Right   . HIP SURGERY Right 2011   full replacement  . HOLMIUM LASER APPLICATION Left [id number]   Procedure: HOLMIUM LASER APPLICATION;  Surgeon: Robley Fries, MD;  Location: [facility];  Service: Urology;  Laterality: Left;  . LEG SURGERY Left May 2014   femur fracture s/p open and closed reduction in [place], Dr. Jimmye Norman  . ORIF ELBOW FRACTURE Right 05/09/2017   Procedure: ORIF right olecranon fracture with repair/reconstruction, ulnar nerve transposition as needed;  Surgeon:  Roseanne Kaufman, MD;  Location: [facility];  Service: Orthopedics;  Laterality: Right;  Requests 90 mins  . pelvis fracture    . POLYPECTOMY    . REVERSE SHOULDER ARTHROPLASTY Left 06/13/2019   Procedure: REVERSE SHOULDER ARTHROPLASTY;  Surgeon: Justice Britain, MD;  Location: [facility];  Service: Orthopedics;  Laterality: Left;  156min  . SHOULDER ARTHROSCOPY W/ ROTATOR CUFF REPAIR Right   . TRAPEZIUM RESECTION Right     Current Outpatient Medications  Medication Sig Dispense Refill  . acetaminophen (TYLENOL) 500 MG tablet Take 1,000 mg by mouth every 6 (six) hours as needed for mild pain.     Marland Kitchen albuterol (VENTOLIN HFA) 108 (90 Base) MCG/ACT inhaler Inhale 2 puffs into the lungs every 4 (four) hours as needed  for wheezing or shortness of breath. 6.7 g 1  . apixaban (ELIQUIS) 2.5 MG TABS tablet Take 1 tablet (2.5 mg total) by mouth 2 (two) times daily. 180 tablet 1  . benzonatate (TESSALON) 100 MG capsule Take 1 capsule (100 mg total) by mouth every 6 (six) hours as needed for cough. 30 capsule 0  . budesonide-formoterol (SYMBICORT) 160-4.5 MCG/ACT inhaler Inhale 2 puffs into the lungs in the morning and at bedtime. 10.2 g 12  . budesonide-formoterol (SYMBICORT) 160-4.5 MCG/ACT inhaler Symbicort 160 mcg-4.5 mcg/actuation HFA aerosol inhaler  INHALE 2 PUFFS INTO THE LUNGS IN THE MORNING AND AT BEDTIME    . calcium carbonate (TUMS - DOSED IN MG ELEMENTAL CALCIUM) 500 MG chewable tablet Chew 1 tablet by mouth daily as needed for indigestion.     . cetirizine (ZYRTEC) 10 MG tablet Take 10 mg by mouth daily.    Marland Kitchen denosumab (PROLIA) 60 MG/ML SOLN injection Inject 60 mg into the skin every 6 (six) months. Administer in upper arm, thigh, or abdomen    . dextromethorphan (DELSYM) 30 MG/5ML liquid Take 30 mg by mouth 2 (two) times daily as needed for cough.    . diltiazem (CARDIZEM CD) 180 MG 24 hr capsule Take 180 mg by mouth at bedtime.     . docusate sodium (COLACE) 50 MG capsule Take 100 mg by mouth at bedtime.     . fluticasone (FLONASE) 50 MCG/ACT nasal spray Place 2 sprays into both nostrils 2 (two) times daily. 16 g 3  . furosemide (LASIX) 20 MG tablet Take one tablet daily as needed for swelling 30 tablet 1  . ipratropium (ATROVENT HFA) 17 MCG/ACT inhaler Inhale 1 puff into the lungs 2 (two) times daily. 1 Inhaler 2  . melatonin 5 MG TABS Take 10 mg by mouth at bedtime.     . nitroGLYCERIN (NITROSTAT) 0.4 MG SL tablet Place 1 tablet (0.4 mg total) under the tongue every 5 (five) minutes as needed for chest pain. 25 tablet 3  . omeprazole (PRILOSEC) 40 MG capsule Take 40 mg by mouth 2 (two) times daily.     . ondansetron (ZOFRAN-ODT) 4 MG disintegrating tablet Take 4 mg by mouth every 4 (four) hours as  needed for nausea or vomiting.     Marland Kitchen oxyCODONE-acetaminophen (PERCOCET) 5-325 MG tablet Take 1 tablet by mouth every 4 (four) hours as needed (max 6 q for post op pain). 30 tablet 0  . polyethylene glycol (MIRALAX / GLYCOLAX) 17 g packet Take 17 g by mouth daily.    . rosuvastatin (CRESTOR) 10 MG tablet Take 1 tablet (10 mg total) by mouth 3 (three) times a week. Monday, Wednesday and Friday. 45 tablet 3  . Vitamin D, Ergocalciferol, (  DRISDOL) 50000 units CAPS capsule Take 50,000 Units by mouth every Friday.      No current facility-administered medications for this visit.    Family History  Problem Relation Age of Onset  . Colon cancer Mother 32  . Cancer Mother   . Lung cancer Father   . Heart disease Father   . Arrhythmia Father   . Cancer Father   . Cancer Sister        ovarian  . Heart attack Brother   . Esophageal cancer Neg Hx   . Rectal cancer Neg Hx   . Stomach cancer Neg Hx   . Stroke Neg Hx     Review of Systems  Exam:   LMP 02/22/1983 (Approximate)   Weight change: @WEIGHTCHANGE @ Height:      Ht Readings from Last 3 Encounters:  01/15/20 5\' 3"  (1.6 m)  11/19/19 5\' 3"  (1.6 m)  11/06/19 5\' 3"  (1.6 m)    General appearance: alert, cooperative and appears stated age Head: Normocephalic, without obvious abnormality, atraumatic Neck: no adenopathy, supple, symmetrical, trachea midline and thyroid {CHL AMB PHY EX THYROID NORM DEFAULT:[phone]::"normal to inspection and palpation"} Lungs: clear to auscultation bilaterally Cardiovascular: regular rate and rhythm Breasts: {Exam; breast:13139::"normal appearance, no masses or tenderness"} Abdomen: soft, non-tender; non distended,  no masses,  no organomegaly Extremities: extremities normal, atraumatic, no cyanosis or edema Skin: Skin color, texture, turgor normal. No rashes or lesions Lymph nodes: Cervical, supraclavicular, and axillary nodes normal. No abnormal inguinal nodes palpated Neurologic: Grossly  normal   Pelvic: External genitalia:  no lesions              Urethra:  normal appearing urethra with no masses, tenderness or lesions              Bartholins and Skenes: normal                 Vagina: normal appearing vagina with normal color and discharge, no lesions              Cervix: {CHL AMB PHY EX CERVIX NORM DEFAULT:[phone]::"no lesions"}               Bimanual Exam:  Uterus:  {CHL AMB PHY EX UTERUS NORM DEFAULT:[phone]::"normal size, contour, position, consistency, mobility, non-tender"}              Adnexa: {CHL AMB PHY EX ADNEXA NO MASS DEFAULT:[phone]::"no mass, fullness, tenderness"}               Rectovaginal: Confirms               Anus:  normal sphincter tone, no lesions  *** chaperoned for the exam.  A:  Well Woman with normal exam  P:

## 2020-04-03 ENCOUNTER — Ambulatory Visit: Payer: Medicare Other | Admitting: Obstetrics and Gynecology

## 2020-04-13 ENCOUNTER — Telehealth: Payer: Self-pay | Admitting: Physician Assistant

## 2020-04-13 DIAGNOSIS — I480 Paroxysmal atrial fibrillation: Secondary | ICD-10-CM

## 2020-04-13 MED ORDER — APIXABAN 2.5 MG PO TABS: 2.5000 mg | ORAL_TABLET | Freq: Two times a day (BID) | ORAL | 1 refills | Status: DC

## 2020-04-13 NOTE — Telephone Encounter (Signed)
*  STAT* If patient is at the pharmacy, call can be transferred to refill team.   1. Which medications need to be refilled? (please list name of each medication and dose if known) apixaban (ELIQUIS) 2.5 MG TABS tablet  2. Which pharmacy/location (including street and city if local pharmacy) is medication to be sent to? [place], [place]  3. Do they need a 30 day or 90 day supply? 90 day  Patient is out of medication

## 2020-04-13 NOTE — Telephone Encounter (Signed)
Pt last saw [name], [place] 01/15/20, last labs 11/26/19 Creat 0.81, age 86, weight 55.3, based on specified criteria pt is on appropriate dosage of Eliquis 2.5mg  BID for afib.  Will refill rx.  Called spoke with pt, she is out of her current rx of 2.5mg  of Eliquis, but she has a sample bottle of the 5mg  tablets that she states she can cut in half until her mail order rx arrives. Advised pt her 90 day rx for Eliquis 2.5mg  tablets has been sent to [facility] mail order.

## 2020-04-20 ENCOUNTER — Ambulatory Visit (INDEPENDENT_AMBULATORY_CARE_PROVIDER_SITE_OTHER): Payer: Medicare Other | Admitting: Adult Health

## 2020-04-20 VITALS — BP 122/57 | HR 64 | Ht 63.0 in | Wt 122.0 lb

## 2020-04-20 DIAGNOSIS — R413 Other amnesia: Secondary | ICD-10-CM

## 2020-04-20 NOTE — Progress Notes (Addendum)
PATIENT: CAROLJEAN MONSIVAIS DOB: 11/14/1934  REASON FOR VISIT: follow up HISTORY FROM: patient  HISTORY OF PRESENT ILLNESS: Today 04/20/20:  Ms. Taitt is an 86 year old female with a history of memory disturbance.  She returns today for follow-up.  She states that several weeks ago she felt that her memory has gotten worse.  She believes she was also diagnosed with a UTI around that time.  She states since then she feels back to her baseline.  She lives with her daughter and son-in-law.  She is able to complete all ADLs independently.  She manages her own medications but sometimes her daughter has to remind her to take her medicine.  She manages her own finances without difficulty.  She drives a vehicle with no difficulty.  Denies any trouble sleeping.  She is not on any medication for her memory.  HISTORY: 11/02/2017: MMSE improved. Still c/o memory changes since 12/2015 however MMSE stable even improved. Sleep test was very mild for OSA and cpap was offered. MRI in 2017 did shpw mild diffuse and moderate perisylvian atrophy. Extensive testing in the past was normal (b12, mma, ace, sed, ana, sjogren,rf,heavy metals,IFE,b1,ck, AcRAb). She has a new issue, vertigo, left hearing loss, imbalance, worsening over last several months, no inciting events, no drainage or congestion. She is scheduled to see an ENT.  CT head 2019: reviewed images and agree with the following:  IMPRESSION: 1. No CT evidence for acute intracranial abnormality. 2. Atrophy and moderate small vessel ischemic changes of the white matter.  Interval history 01/02/2017: MMSE from 28/30 to 27/30 but improved to 29/30. Still c/o memory changes and extreme fatigue. Discussed sleep study for her fatigue and frequent awakenings and memory issues, need sleep eval for OSA or PLMS.  If this is negative/normal will send for formal neurocognitive testing.   Interval history 08/31/2016:This is an absolutely lovely 86 year old female  here for follow-up.She is having short-term memory loss. A few hours or the next day she forgetsconversations or things she's been told. They called and invited her to their sister's birthday party, she couldn't remember the restaurant name. Started a few years ago and is worsening. No Fhx of dementia. She lives with daughter and son in [place]. 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 She also has extreme fatigue during the day. Recommended sleep apnea evaluation.  VQX:IHWTUUE S Martinis a 86 y.o.femalefor follow up offatigue and weaknessand a new problem memory loss. Past medical history of hypertension, carotid bruit right, degenerative joint disease, fatigue, sarcoidosis, hyperlipidemia, left leg femur broken status-post rods, right hip replacement. She has significant fatigue. She complains of generalized weakness, fatigue, imbalance, sinking feelings like she is going to pass out, decreased energy, decreased smell and loss of taste, generally feeling bad. Extensive lab testing including CK, acetylcholine receptor antibodies, B1, multiple myeloma panel, heavy metals, rheumatoid factor, Sjogren's antibodies, ANA with reflex, sedimentation rate, angiotensin-converting enzyme, TSH, methylmalonic acid, B12 and folate all normal. MRI of the brain showed Mild diffuse and moderate perisylvian atrophy and mild small vessel disease but no etiology for her symptoms. EMG/NCS today to evaluate for other neuromuscular disorders such as myopathy/myositis. Reviewed all results with patient today.   Reviewed notes, labs and imaging from outside physicians, which showed:  B12 283. This was drawn 12/07/2015. CMP showed elevated MCV 104, BUN 24, creatinine 0.8  otherwise unremarkable drawn 11/30/2015.   Patient was seen a Education administrator. Complaining of strange sensations in her head, like she is thinking, kind of weak, could not describe it. Feels like she is having "sinking spells. Feels that balance, has also been seen by cardiology in October 2017. She was evaluated for nonobstructive one-vessel coronary artery disease, is continuing aspirin and statin PSVT without recurrence on calcium channel blocker, blood pressure control, history of hyperlipidemia intolerant of statins however is using Crestor twice a week, follow-up Dopplers and carotid artery disease were ordered, also discussed fatigue after eating, she follows with pulmonology for history of sarcoidosis. She has arthritis     REVIEW OF SYSTEMS: Out of a complete 14 system review of symptoms, the patient complains only of the following symptoms, and all other reviewed systems are negative.  See HPI  ALLERGIES: Allergies  Allergen Reactions  . Levaquin [Levofloxacin] Other (See Comments)    Leg pain .Marland Kitchen Per doctor not to take again  . Sulfonamide Derivatives     Pt unsure  . Oxycodone Nausea Only    Can take oxycodone with Zofran  . Morphine Nausea Only    HOME MEDICATIONS: Outpatient Medications Prior to Visit  Medication Sig Dispense Refill  . acetaminophen (TYLENOL) 500 MG tablet Take 1,000 mg by mouth every 6 (six) hours as needed for mild pain.     Marland Kitchen albuterol (VENTOLIN HFA) 108 (90 Base) MCG/ACT inhaler Inhale 2 puffs into the lungs every 4 (four) hours as needed for wheezing or shortness of breath. 6.7 g 1  . apixaban (ELIQUIS) 2.5 MG TABS tablet Take 1 tablet (2.5 mg total) by mouth 2 (two) times daily. 180 tablet 1  . benzonatate (TESSALON) 100 MG capsule Take 1 capsule (100 mg total) by mouth every 6 (six) hours as needed for cough. 30 capsule 0  . budesonide-formoterol (SYMBICORT) 160-4.5 MCG/ACT inhaler Inhale 2 puffs into the lungs in the morning and at bedtime. 10.2 g 12  . budesonide-formoterol (SYMBICORT) 160-4.5 MCG/ACT  inhaler Symbicort 160 mcg-4.5 mcg/actuation HFA aerosol inhaler  INHALE 2 PUFFS INTO THE LUNGS IN THE MORNING AND AT BEDTIME    . calcium carbonate (TUMS - DOSED IN MG ELEMENTAL CALCIUM) 500 MG chewable tablet Chew 1 tablet by mouth daily as needed for indigestion.     . cetirizine (ZYRTEC) 10 MG tablet Take 10 mg by mouth daily.    Marland Kitchen denosumab (PROLIA) 60 MG/ML SOLN injection Inject 60 mg into the skin every 6 (six) months. Administer in upper arm, thigh, or abdomen    . dextromethorphan (DELSYM) 30 MG/5ML liquid Take 30 mg by mouth 2 (two) times daily as needed for cough.    . diltiazem (CARDIZEM CD) 180 MG 24 hr capsule Take 180 mg by mouth at bedtime.     . docusate sodium (COLACE) 50 MG capsule Take 100 mg by mouth at bedtime.     . fluticasone (FLONASE) 50 MCG/ACT nasal spray Place 2 sprays into both nostrils 2 (two) times daily. 16 g 3  . ipratropium (ATROVENT HFA) 17 MCG/ACT inhaler Inhale 1 puff into the lungs 2 (two) times daily. 1 Inhaler 2  . melatonin 5 MG TABS Take 10 mg by mouth at bedtime.    . nitroGLYCERIN (NITROSTAT) 0.4 MG SL tablet Place 1 tablet (0.4 mg total) under the tongue every 5 (five) minutes as needed for chest pain. 25 tablet 3  . omeprazole (PRILOSEC) 40 MG capsule Take 40 mg by mouth 2 (two)  times daily.     . ondansetron (ZOFRAN-ODT) 4 MG disintegrating tablet Take 4 mg by mouth every 4 (four) hours as needed for nausea or vomiting.     Marland Kitchen oxyCODONE-acetaminophen (PERCOCET) 5-325 MG tablet Take 1 tablet by mouth every 4 (four) hours as needed (max 6 q for post op pain). 30 tablet 0  . polyethylene glycol (MIRALAX / GLYCOLAX) 17 g packet Take 17 g by mouth daily.    . rosuvastatin (CRESTOR) 10 MG tablet Take 1 tablet (10 mg total) by mouth 3 (three) times a week. Monday, Wednesday and Friday. 45 tablet 3  . Vitamin D, Ergocalciferol, (DRISDOL) 50000 units CAPS capsule Take 50,000 Units by mouth every Friday.     . furosemide (LASIX) 20 MG tablet Take one tablet daily  as needed for swelling 30 tablet 1   No facility-administered medications prior to visit.    PAST MEDICAL HISTORY: Past Medical History:  Diagnosis Date  . Allergy    SEASONAL  . Anemia   . Arthritis   . Asthma    "slight"   . Baker's cyst of knee, right   . CAD (coronary artery disease)    a. Myoview 10/15 - normal EF 70% // Myoview 11/16: EF 75%, normal perfusion, (there was no blood pressure drop at low level exercise with Lexiscan infusion), low risk study // c. LHC 3/17 - LAD irregs, oLCx 70 (neg FFR), mRCA 30, EF 55-65% >> med Rx // Myoview 07/2018:  EF 73, extracardiac uptake, no significant ischemia (reviewed with Dr. Burt Knack), Low Risk   . Carotid stenosis    a. Carotid US 9/15 - Bilateral 1-39% ICA >> FU 2 years // b. Bilateral ICA 1-39 >> FU prn // Carotid US 06/2018: bilat 1-39; fu prn  . Dyspnea    due to Sarcoidosis. When is windy or humed  . Dysrhythmia    fast heart rate  . Elbow fracture 04/2017   Right, had surgery  . Esophageal reflux   . Hematochezia   . History of echocardiogram    a. Echo 10/15 - EF 60-65%, no RWMA  . HLD (hyperlipidemia)   . Hx of colonoscopy   . Osteoporosis   . Other chronic pulmonary heart diseases   . Paroxysmal supraventricular tachycardia ([place])   . Pneumonia   . Sarcoidosis     PAST SURGICAL HISTORY: Past Surgical History:  Procedure Laterality Date  . arm surgery Right   . BLADDER SURGERY    . CARDIAC CATHETERIZATION N/A 05/20/2015   Procedure: Left Heart Cath and Coronary Angiography;  Surgeon: Sherren Mocha, MD;  Location: [facility];  Service: Cardiovascular;  Laterality: N/A;  . CARPAL TUNNEL RELEASE Left   . COLONOSCOPY    . CYSTOSCOPY W/ RETROGRADES Left 07/30/2019   Procedure: CYSTOSCOPY WITH RETROGRADE PYELOGRAM LEFT STENT PLACEMENT;  Surgeon: Ardis Hughs, MD;  Location: [facility];  Service: Urology;  Laterality: Left;  . CYSTOSCOPY WITH RETROGRADE PYELOGRAM, URETEROSCOPY AND STENT PLACEMENT Left 08/20/2019    Procedure: CYSTOSCOPY, URETEROSCOPY AND STENT PLACEMENT;  Surgeon: Robley Fries, MD;  Location: [facility];  Service: Urology;  Laterality: Left;  1 HR  . FOOT SURGERY Right   . HIP SURGERY Right 2011   full replacement  . HOLMIUM LASER APPLICATION Left 4/96/7591   Procedure: HOLMIUM LASER APPLICATION;  Surgeon: Robley Fries, MD;  Location: [facility];  Service: Urology;  Laterality: Left;  . LEG SURGERY Left May 2014   femur fracture s/p open  and closed reduction in [place], Dr. Jimmye Norman  . ORIF ELBOW FRACTURE Right 05/09/2017   Procedure: ORIF right olecranon fracture with repair/reconstruction, ulnar nerve transposition as needed;  Surgeon: Roseanne Kaufman, MD;  Location: [facility];  Service: Orthopedics;  Laterality: Right;  Requests 90 mins  . pelvis fracture    . POLYPECTOMY    . REVERSE SHOULDER ARTHROPLASTY Left 06/13/2019   Procedure: REVERSE SHOULDER ARTHROPLASTY;  Surgeon: Justice Britain, MD;  Location: [facility];  Service: Orthopedics;  Laterality: Left;  137mn  . SHOULDER ARTHROSCOPY W/ ROTATOR CUFF REPAIR Right   . TRAPEZIUM RESECTION Right     FAMILY HISTORY: Family History  Problem Relation Age of Onset  . Colon cancer Mother 6105 . Cancer Mother   . Lung cancer Father   . Heart disease Father   . Arrhythmia Father   . Cancer Father   . Cancer Sister        ovarian  . Heart attack Brother   . Esophageal cancer Neg Hx   . Rectal cancer Neg Hx   . Stomach cancer Neg Hx   . Stroke Neg Hx     SOCIAL HISTORY: Social History   Socioeconomic History  . Marital status: Widowed    Spouse name: Not on file  . Number of children: 1  . Years of education: Not on file  . Highest education level: Some college, no degree  Occupational History  . Occupation: retired    Comment: still works as sScientist, product/process development RETIRED  Tobacco Use  . Smoking status: Never Smoker  . Smokeless tobacco: Never Used  Vaping Use  . Vaping Use: Never used  Substance and Sexual  Activity  . Alcohol use: No  . Drug use: No  . Sexual activity: Not Currently    Partners: Male    Birth control/protection: Post-menopausal  Other Topics Concern  . Not on file  Social History Narrative   Lives with daughter and son in law   decaffeinated  use:  Coffee 1 per day   Right handed   Social Determinants of Health   Financial Resource Strain: Not on file  Food Insecurity: Not on file  Transportation Needs: Not on file  Physical Activity: Not on file  Stress: Not on file  Social Connections: Not on file  Intimate Partner Violence: Not on file      PHYSICAL EXAM  Vitals:   04/20/20 1334  BP: (!) 122/57  Pulse: 64  Weight: 122 lb (55.3 kg)  Height: _0  (1.6 m)   Body mass index is 21.61 kg/m.   MMSE - Mini Mental State Exam 04/20/2020 11/02/2017 01/02/2017  Orientation to time _1 Orientation to Place _2 Registration _3 Attention/ Calculation _4 Recall _5 Language- name 2 objects _6 Language- repeat 0 0 1  Language- follow 3 step command _7 Language- read & follow direction _8 Write a sentence _9 Copy design 1 1 0  Total score _10 Generalized: Well developed, in no acute distress   Neurological examination  Mentation: Alert oriented to time, place, history taking. Follows all commands speech and language fluent Cranial nerve II-XII: Pupils were equal round reactive to light. Extraocular movements were full, visual field were full on confrontational test.  Head turning and shoulder shrug  were normal and  symmetric. Motor: The motor testing reveals 5 over 5 strength of all 4 extremities. Good symmetric motor tone is noted throughout.  Sensory: Sensory testing is intact to soft touch on all 4 extremities. No evidence of extinction is noted.  Coordination: Cerebellar testing reveals good finger-nose-finger and heel-to-shin bilaterally.  Gait and station: Uses a cane when ambulating.  Tandem gait not  attempted. Reflexes: Deep tendon reflexes are symmetric and normal bilaterally.   DIAGNOSTIC DATA (LABS, IMAGING, TESTING) - I reviewed patient records, labs, notes, testing and imaging myself where available.  Lab Results  Component Value Date   WBC 7.9 11/26/2019   HGB 12.7 11/26/2019   HCT 39.0 11/26/2019   MCV 96 11/26/2019   PLT 237 11/26/2019      Component Value Date/Time   NA 139 11/26/2019 1234   K 4.3 11/26/2019 1234   CL 102 11/26/2019 1234   CO2 24 11/26/2019 1234   GLUCOSE 156 (H) 11/26/2019 1234   GLUCOSE 107 (H) 09/26/2019 0335   BUN 20 11/26/2019 1234   CREATININE 0.81 11/26/2019 1234   CREATININE 0.85 05/18/2015 1028   CALCIUM 9.7 11/26/2019 1234   PROT 6.3 11/07/2019 1017   ALBUMIN 3.9 11/07/2019 1017   AST 15 11/07/2019 1017   ALT 7 11/07/2019 1017   ALKPHOS 131 (H) 11/07/2019 1017   BILITOT 0.3 11/07/2019 1017   GFRNONAA 67 11/26/2019 1234   GFRAA 78 11/26/2019 1234   Lab Results  Component Value Date   CHOL 149 11/27/2017   HDL 73 11/27/2017   LDLCALC 63 11/27/2017   TRIG 67 11/27/2017   CHOLHDL 2.0 11/27/2017   Lab Results  Component Value Date   HGBA1C 5.8 (H) 09/19/2019   Lab Results  Component Value Date   JHERDEYC14 481 07/19/2019   Lab Results  Component Value Date   TSH 0.787 09/23/2019      ASSESSMENT AND PLAN 86 y.o. year old female  has a past medical history of Allergy, Anemia, Arthritis, Asthma, Baker's cyst of knee, right, CAD (coronary artery disease), Carotid stenosis, Dyspnea, Dysrhythmia, Elbow fracture (04/2017), Esophageal reflux, Hematochezia, History of echocardiogram, HLD (hyperlipidemia), colonoscopy, Osteoporosis, Other chronic pulmonary heart diseases, Paroxysmal supraventricular tachycardia ([place]), Pneumonia, and Sarcoidosis. here with:  1.  Memory disturbance  --MMSE 28/30 previously 29/30 --Memory is stable--Continue to monitor --Follow-up on an as-needed basis    I spent 30 minutes of face-to-face  and non-face-to-face time with patient.  This included previsit chart review, lab review, study review, order entry, electronic health record documentation, patient education.  Ward Givens, MSN, NP-C 04/20/2020, 1:55 PM [facility] [address] [place], [place] [address] [phone]  Made any corrections needed, and agree with history, physical, neuro exam,assessment and plan as stated.     [name], MD [facility]

## 2020-05-01 ENCOUNTER — Encounter: Payer: Self-pay | Admitting: Obstetrics and Gynecology

## 2020-05-01 ENCOUNTER — Other Ambulatory Visit: Payer: Self-pay

## 2020-05-01 ENCOUNTER — Ambulatory Visit (INDEPENDENT_AMBULATORY_CARE_PROVIDER_SITE_OTHER): Payer: Medicare Other | Admitting: Obstetrics and Gynecology

## 2020-05-01 VITALS — BP 120/70 | HR 78 | Ht 63.0 in | Wt 124.0 lb

## 2020-05-01 DIAGNOSIS — Z8616 Personal history of COVID-19: Secondary | ICD-10-CM | POA: Insufficient documentation

## 2020-05-01 DIAGNOSIS — M810 Age-related osteoporosis without current pathological fracture: Secondary | ICD-10-CM | POA: Insufficient documentation

## 2020-05-01 DIAGNOSIS — Z01419 Encounter for gynecological examination (general) (routine) without abnormal findings: Secondary | ICD-10-CM | POA: Diagnosis not present

## 2020-05-01 DIAGNOSIS — R0981 Nasal congestion: Secondary | ICD-10-CM | POA: Insufficient documentation

## 2020-05-01 DIAGNOSIS — H6120 Impacted cerumen, unspecified ear: Secondary | ICD-10-CM | POA: Insufficient documentation

## 2020-05-01 DIAGNOSIS — R739 Hyperglycemia, unspecified: Secondary | ICD-10-CM | POA: Insufficient documentation

## 2020-05-01 DIAGNOSIS — N814 Uterovaginal prolapse, unspecified: Secondary | ICD-10-CM

## 2020-05-01 NOTE — Patient Instructions (Signed)

## 2020-05-01 NOTE — Progress Notes (Signed)
86 y.o. G61P1001 Widowed White or Caucasian Not Hispanic or Latino female here for annual exam.   She feels tired all the time. She had blood work with her primary, was told everything was okay. Not depressed.   H/O OAB, has a Dealer. Still having issues.  H/O uterine prolapse, not bothersome  Constipation is up and down. She tries to take metamucil daily.   H/O osteoporosis, on prolia.  Her sister died last year, she was her person. Lonely without her.     Patient's last menstrual period was 02/22/1983 (approximate).          Sexually active: No.  The current method of family planning is post menopausal status.    Exercising: Yes.    walking Smoker:  no  Health Maintenance: Pap:  03/22/18 Neg  02/24/16 Neg History of abnormal Pap:  no MMG:  04/16/18 BIRADS 2 benign, she reports having a normal mammogram last year. BMD:   2021 done with PCP Colonoscopy: 09-18-13 polyps, will only have another one if she has problems. TDaP:  01/14/12 Gardasil: n/a   reports that she has never smoked. She has never used smokeless tobacco. She reports that she does not drink alcohol and does not use drugs.  Her daughter, 2 grand sons and 1 great grand daughter are all local.   Past Medical History:  Diagnosis Date  . Allergy    SEASONAL  . Anemia   . Arthritis   . Asthma    "slight"   . Baker's cyst of knee, right   . CAD (coronary artery disease)    a. Myoview 10/15 - normal EF 70% // Myoview 11/16: EF 75%, normal perfusion, (there was no blood pressure drop at low level exercise with Lexiscan infusion), low risk study // c. LHC 3/17 - LAD irregs, oLCx 70 (neg FFR), mRCA 30, EF 55-65% >> med Rx // Myoview 07/2018:  EF 73, extracardiac uptake, no significant ischemia (reviewed with Dr. Burt Knack), Low Risk   . Carotid stenosis    a. Carotid US 9/15 - Bilateral 1-39% ICA >> FU 2 years // b. Bilateral ICA 1-39 >> FU prn // Carotid US 06/2018: bilat 1-39; fu prn  . Dyspnea    due to Sarcoidosis. When  is windy or humed  . Dysrhythmia    fast heart rate  . Elbow fracture 04/2017   Right, had surgery  . Esophageal reflux   . Hematochezia   . History of echocardiogram    a. Echo 10/15 - EF 60-65%, no RWMA  . HLD (hyperlipidemia)   . Hx of colonoscopy   . Osteoporosis   . Other chronic pulmonary heart diseases   . Paroxysmal supraventricular tachycardia ([place])   . Pneumonia   . Sarcoidosis     Past Surgical History:  Procedure Laterality Date  . arm surgery Right   . BLADDER SURGERY    . CARDIAC CATHETERIZATION N/A 05/20/2015   Procedure: Left Heart Cath and Coronary Angiography;  Surgeon: Sherren Mocha, MD;  Location: [facility];  Service: Cardiovascular;  Laterality: N/A;  . CARPAL TUNNEL RELEASE Left   . COLONOSCOPY    . CYSTOSCOPY W/ RETROGRADES Left 07/30/2019   Procedure: CYSTOSCOPY WITH RETROGRADE PYELOGRAM LEFT STENT PLACEMENT;  Surgeon: Ardis Hughs, MD;  Location: [facility];  Service: Urology;  Laterality: Left;  . CYSTOSCOPY WITH RETROGRADE PYELOGRAM, URETEROSCOPY AND STENT PLACEMENT Left 08/20/2019   Procedure: CYSTOSCOPY, URETEROSCOPY AND STENT PLACEMENT;  Surgeon: Robley Fries, MD;  Location: [facility];  Service: Urology;  Laterality: Left;  1 HR  . FOOT SURGERY Right   . HIP SURGERY Right 2011   full replacement  . HOLMIUM LASER APPLICATION Left 08/18/3149   Procedure: HOLMIUM LASER APPLICATION;  Surgeon: Robley Fries, MD;  Location: [facility];  Service: Urology;  Laterality: Left;  . LEG SURGERY Left May 2014   femur fracture s/p open and closed reduction in [place], Dr. Jimmye Norman  . ORIF ELBOW FRACTURE Right 05/09/2017   Procedure: ORIF right olecranon fracture with repair/reconstruction, ulnar nerve transposition as needed;  Surgeon: Roseanne Kaufman, MD;  Location: [facility];  Service: Orthopedics;  Laterality: Right;  Requests 90 mins  . pelvis fracture    . POLYPECTOMY    . REVERSE SHOULDER ARTHROPLASTY Left 06/13/2019   Procedure: REVERSE SHOULDER  ARTHROPLASTY;  Surgeon: Justice Britain, MD;  Location: [facility];  Service: Orthopedics;  Laterality: Left;  188min  . SHOULDER ARTHROSCOPY W/ ROTATOR CUFF REPAIR Right   . TRAPEZIUM RESECTION Right     Current Outpatient Medications  Medication Sig Dispense Refill  . acetaminophen (TYLENOL) 500 MG tablet Take 1,000 mg by mouth every 6 (six) hours as needed for mild pain.     Marland Kitchen albuterol (VENTOLIN HFA) 108 (90 Base) MCG/ACT inhaler Inhale 2 puffs into the lungs every 4 (four) hours as needed for wheezing or shortness of breath. 6.7 g 1  . apixaban (ELIQUIS) 2.5 MG TABS tablet Take 1 tablet (2.5 mg total) by mouth 2 (two) times daily. 180 tablet 1  . apixaban (ELIQUIS) 5 MG TABS tablet Take 5 mg by mouth 2 (two) times daily.    . budesonide-formoterol (SYMBICORT) 160-4.5 MCG/ACT inhaler Inhale 2 puffs into the lungs in the morning and at bedtime. 10.2 g 12  . budesonide-formoterol (SYMBICORT) 160-4.5 MCG/ACT inhaler Symbicort 160 mcg-4.5 mcg/actuation HFA aerosol inhaler  INHALE 2 PUFFS INTO THE LUNGS IN THE MORNING AND AT BEDTIME    . calcium carbonate (TUMS - DOSED IN MG ELEMENTAL CALCIUM) 500 MG chewable tablet Chew 1 tablet by mouth daily as needed for indigestion.     . cetirizine (ZYRTEC) 10 MG tablet Take 10 mg by mouth daily.    Marland Kitchen denosumab (PROLIA) 60 MG/ML SOLN injection Inject 60 mg into the skin every 6 (six) months. Administer in upper arm, thigh, or abdomen    . dextromethorphan (DELSYM) 30 MG/5ML liquid Take 30 mg by mouth 2 (two) times daily as needed for cough.    . diltiazem (CARDIZEM CD) 180 MG 24 hr capsule Take 180 mg by mouth at bedtime.     . docusate sodium (COLACE) 50 MG capsule Take 100 mg by mouth at bedtime.     . fluticasone (FLONASE) 50 MCG/ACT nasal spray Place 2 sprays into both nostrils 2 (two) times daily. 16 g 3  . ipratropium (ATROVENT HFA) 17 MCG/ACT inhaler Inhale 1 puff into the lungs 2 (two) times daily. 1 Inhaler 2  . melatonin 5 MG TABS Take 10 mg by mouth  at bedtime.    . nitroGLYCERIN (NITROSTAT) 0.4 MG SL tablet Place 1 tablet (0.4 mg total) under the tongue every 5 (five) minutes as needed for chest pain. 25 tablet 3  . omeprazole (PRILOSEC) 40 MG capsule Take 40 mg by mouth 2 (two) times daily.     . ondansetron (ZOFRAN-ODT) 4 MG disintegrating tablet Take 4 mg by mouth every 4 (four) hours as needed for nausea or vomiting.     Marland Kitchen oxybutynin (DITROPAN-XL) 10 MG  24 hr tablet Take 10 mg by mouth at bedtime.    Marland Kitchen oxyCODONE-acetaminophen (PERCOCET) 5-325 MG tablet Take 1 tablet by mouth every 4 (four) hours as needed (max 6 q for post op pain). 30 tablet 0  . polyethylene glycol (MIRALAX / GLYCOLAX) 17 g packet Take 17 g by mouth daily.    . rosuvastatin (CRESTOR) 10 MG tablet Take 1 tablet (10 mg total) by mouth 3 (three) times a week. Monday, Wednesday and Friday. 45 tablet 3  . Vitamin D, Ergocalciferol, (DRISDOL) 50000 units CAPS capsule Take 50,000 Units by mouth every Friday.      No current facility-administered medications for this visit.    Family History  Problem Relation Age of Onset  . Colon cancer Mother 10  . Cancer Mother   . Lung cancer Father   . Heart disease Father   . Arrhythmia Father   . Cancer Father   . Cancer Sister        ovarian  . Heart attack Brother   . Esophageal cancer Neg Hx   . Rectal cancer Neg Hx   . Stomach cancer Neg Hx   . Stroke Neg Hx     Review of Systems  Constitutional: Negative.   HENT: Negative.   Eyes: Negative.   Respiratory: Negative.   Cardiovascular: Negative.   Gastrointestinal: Negative.   Endocrine: Negative.   Genitourinary: Negative.   Musculoskeletal: Negative.   Skin: Negative.   Allergic/Immunologic: Negative.   Neurological: Negative.   Hematological: Negative.   Psychiatric/Behavioral: Negative.     Exam:   BP 120/70   Pulse 78   Ht 5\' 3"  (1.6 m)   Wt 124 lb (56.2 kg)   LMP 02/22/1983 (Approximate)   SpO2 96%   BMI 21.97 kg/m   Weight change:  @WEIGHTCHANGE @ Height:   Height: 5\' 3"  (160 cm)  Ht Readings from Last 3 Encounters:  05/01/20 5\' 3"  (1.6 m)  04/20/20 5\' 3"  (1.6 m)  01/15/20 5\' 3"  (1.6 m)    General appearance: alert, cooperative and appears stated age Head: Normocephalic, without obvious abnormality, atraumatic Neck: no adenopathy, supple, symmetrical, trachea midline and thyroid normal to inspection and palpation Breasts: normal appearance, no masses or tenderness Abdomen: soft, non-tender; non distended,  no masses,  no organomegaly Extremities: extremities normal, atraumatic, no cyanosis or edema Skin: Skin color, texture, turgor normal. No rashes or lesions Lymph nodes: Cervical, supraclavicular, and axillary nodes normal. No abnormal inguinal nodes palpated Neurologic: Grossly normal   Pelvic: External genitalia:  no lesions              Urethra:  normal appearing urethra with no masses, tenderness or lesions              Bartholins and Skenes: normal                 Vagina: atrophic appearing vagina with normal color and discharge, no lesions              Cervix: no lesions   With valsalva the cervix comes to approximately 1 cm above the introitus.                Bimanual Exam:  Uterus:  normal size, contour, position, consistency, mobility, non-tender              Adnexa: no mass, fullness, tenderness               Rectovaginal: Confirms  Anus:  normal sphincter tone, no lesions  Terence Lux chaperoned for the exam.  1. Encounter for gynecological examination without abnormal finding Discussed breast self exam Discussed calcium and vit D intake DEXA with primary No more colonoscopies needed  2. Uterine prolapse Not bothersome.

## 2020-05-04 ENCOUNTER — Ambulatory Visit (INDEPENDENT_AMBULATORY_CARE_PROVIDER_SITE_OTHER): Payer: Medicare Other | Admitting: Podiatry

## 2020-05-04 ENCOUNTER — Encounter: Payer: Self-pay | Admitting: Podiatry

## 2020-05-04 ENCOUNTER — Other Ambulatory Visit: Payer: Self-pay

## 2020-05-04 DIAGNOSIS — G629 Polyneuropathy, unspecified: Secondary | ICD-10-CM

## 2020-05-04 DIAGNOSIS — M2041 Other hammer toe(s) (acquired), right foot: Secondary | ICD-10-CM

## 2020-05-04 DIAGNOSIS — L309 Dermatitis, unspecified: Secondary | ICD-10-CM

## 2020-05-04 MED ORDER — METHYLPREDNISOLONE 4 MG PO TBPK: ORAL_TABLET | ORAL | 0 refills | Status: DC

## 2020-05-04 NOTE — Progress Notes (Signed)
Subjective:   Patient ID: [name], female   DOB: 86 y.o.   MRN: [id number]   HPI Patient presents with dry skin and peeling of the right foot 1 week duration also has significant digital deformities which at times can be bothersome with corn callus formation   ROS      Objective:  Physical Exam  Neurovascular status intact no indications that this is a vascular issue with red plantar foot extending into the heel with inflammation occurring slight fissuring and severe digital deformities with distal keratotic lesion formation     Assessment:  Poor health individual with nail disease hammertoe deformity and some kind of skin dermatological condition     Plan:  H&P reviewed all conditions and recommended the continuation of conservative care with a steroid Dosepak due to the inflammation present along with topical lubricants and digital padding along with wide shoe gear.  Patient will be seen back for [place] to recheck
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
## 2020-06-03 ENCOUNTER — Telehealth: Payer: Self-pay | Admitting: Cardiovascular Disease

## 2020-06-03 MED ORDER — ROSUVASTATIN CALCIUM 10 MG PO TABS: 10.0000 mg | ORAL_TABLET | ORAL | 2 refills | Status: DC

## 2020-06-03 NOTE — Telephone Encounter (Signed)
Pt's medication was sent to pt's pharmacy as requested. Confirmation received.  °

## 2020-06-03 NOTE — Telephone Encounter (Signed)
*  STAT* If patient is at the pharmacy, call can be transferred to refill team.   1. Which medications need to be refilled? (please list name of each medication and dose if known)  rosuvastatin (CRESTOR) 10 MG tablet  2. Which pharmacy/location (including street and city if local pharmacy) is medication to be sent to?  [facility] [place], [address] AT [address]  3. Do they need a 30 day or 90 day supply? [place]

## 2020-06-04 ENCOUNTER — Ambulatory Visit: Payer: Medicare Other | Admitting: Neurology

## 2020-06-08 DIAGNOSIS — M25561 Pain in right knee: Secondary | ICD-10-CM | POA: Insufficient documentation

## 2020-06-16 ENCOUNTER — Encounter: Payer: Self-pay | Admitting: Emergency Medicine

## 2020-06-16 ENCOUNTER — Ambulatory Visit (INDEPENDENT_AMBULATORY_CARE_PROVIDER_SITE_OTHER): Payer: Medicare Other

## 2020-06-16 ENCOUNTER — Other Ambulatory Visit: Payer: Self-pay | Admitting: Emergency Medicine

## 2020-06-16 ENCOUNTER — Other Ambulatory Visit: Payer: Self-pay

## 2020-06-16 ENCOUNTER — Ambulatory Visit (INDEPENDENT_AMBULATORY_CARE_PROVIDER_SITE_OTHER): Payer: Medicare Other | Admitting: Emergency Medicine

## 2020-06-16 DIAGNOSIS — D869 Sarcoidosis, unspecified: Secondary | ICD-10-CM | POA: Diagnosis not present

## 2020-06-16 DIAGNOSIS — J301 Allergic rhinitis due to pollen: Secondary | ICD-10-CM | POA: Diagnosis not present

## 2020-06-16 DIAGNOSIS — J449 Chronic obstructive pulmonary disease, unspecified: Secondary | ICD-10-CM | POA: Diagnosis not present

## 2020-06-16 DIAGNOSIS — J479 Bronchiectasis, uncomplicated: Secondary | ICD-10-CM | POA: Diagnosis not present

## 2020-06-16 DIAGNOSIS — J0100 Acute maxillary sinusitis, unspecified: Secondary | ICD-10-CM

## 2020-06-16 MED ORDER — FLUTICASONE PROPIONATE 50 MCG/ACT NA SUSP: 2.0000 | Freq: Two times a day (BID) | NASAL | 1 refills | Status: DC

## 2020-06-16 MED ORDER — ALBUTEROL SULFATE HFA 108 (90 BASE) MCG/ACT IN AERS: 2.0000 | INHALATION_SPRAY | RESPIRATORY_TRACT | 1 refills | Status: DC | PRN

## 2020-06-16 MED ORDER — CETIRIZINE HCL 10 MG PO TABS: 10.0000 mg | ORAL_TABLET | Freq: Every day | ORAL | 1 refills | Status: DC

## 2020-06-16 MED ORDER — BUDESONIDE-FORMOTEROL FUMARATE 160-4.5 MCG/ACT IN AERO: 2.0000 | INHALATION_SPRAY | Freq: Two times a day (BID) | RESPIRATORY_TRACT | 1 refills | Status: DC

## 2020-06-16 NOTE — Progress Notes (Signed)
Subjective:    Patient ID: [name], female    DOB: 05-21-1934, 86 y.o.   MRN: [id number]  HPI  ROV 11/19/19 --86 year old woman, never smoker with sarcoidosis, SVT, chronic rhinitis, GERD.  Her pulmonary function testing is consistent with severe obstructive lung disease.  She was hospitalized for pneumonia in July, had lower lobe bilateral infiltrates and associated bronchiectasis on CT chest 09/18/2019.  Has been managed on Mucinex, cetirizine, fluticasone.  She uses flutter valve daily.  I saw her 3 weeks ago at which time she had some increased lower extremity edema.  I treated her with a brief course of Lasix 20 mg daily x 3 days. We changed her Dulera to Symbicort due to insurance reasons. She is also on atrovent bid. She still has a little LE edema. Her breathing is stable on the symbicort. She wonders about stopping the atrovent. Dr Dagmar Hait has started her on lasix prn.   A repeat CT scan of the chest was done 11/05/2019 which I have reviewed, shows interval resolution of her bilateral basilar consolidation with some residual bandlike scarring in the right lung base, diffuse bilateral bronchial wall thickening  ROV 06/16/20 --86 year old woman with sarcoidosis and associated severe obstructive lung disease.  Also with a history of chronic rhinitis, GERD, atrial fibrillation/SVT.  She has bronchiectasis/bronchial wall thickening on imaging. She has been managed on Symbicort (replaced Dulera) but states that she ran out of it 2-3 months ago. She has noticed that her breathing has been worse, especially for the last 3-4 weeks. Decreased functional capacity. She has been hearing wheeze for about a week. No cough currently.   At her last visit we stopped scheduled Atrovent inhaler.  She has albuterol, uses Remains on Mucinex, uses her flutter valve daily.  She restarted her cetirizine about a week ago, but she stopped her fluticasone NS.    Review of Systems  Constitutional: Negative for fever  and unexpected weight change.  HENT: Negative for congestion, dental problem, ear pain, nosebleeds, postnasal drip, rhinorrhea, sinus pressure, sneezing, sore throat and trouble swallowing.   Eyes: Negative for redness and itching.  Respiratory: Negative for cough, chest tightness, shortness of breath and wheezing.   Cardiovascular: Negative for palpitations and leg swelling.  Gastrointestinal: Negative for nausea and vomiting.  Genitourinary: Negative for dysuria.  Musculoskeletal: Negative for joint swelling.  Skin: Negative for rash.  Neurological: Negative for headaches.  Hematological: Does not bruise/bleed easily.  Psychiatric/Behavioral: Negative for dysphoric mood. The patient is not nervous/anxious.       Objective:   Physical Exam Vitals:   06/16/20 1332  BP: 116/64  Pulse: 64  Temp: 97.8 F (36.6 C)  TempSrc: Temporal  SpO2: 96%  Weight: 124 lb 9.6 oz (56.5 kg)  Height: 5\' 3"  (1.6 m)   Gen: Pleasant, kyphotic elderly woman, in no distress,  normal affect  ENT: No lesions,  mouth clear,  oropharynx clear, no postnasal drip  Neck: No JVD, no stridor  Lungs: No use of accessory muscles, few bibasilar insp crackles, L insp squeak  Cardiovascular: RRR, heart sounds normal, no murmur or gallops, trace ankle peripheral edema  Musculoskeletal: No deformities, no cyanosis or clubbing  Neuro: alert, non focal. Poor memory regarding her med regimen  Skin: Warm, no lesions or rashes       Assessment & Plan:  SARCOIDOSIS, PULMONARY Her CT chest has been stable, most recent September 2021.  Plan to repeat her chest x-ray today  Bronchiectasis without complication ([place]) Stable  CT 10/2019, no evidence exacerbation.  Minimal cough and sputum burden.  Continue flutter valve daily, Mucinex.  Allergic rhinitis She just restarted her cetirizine but is not yet back on her fluticasone nasal spray.  We will get this restarted now  COPD (chronic obstructive pulmonary disease)  (HCC) Increased dyspnea, increased wheeze that correspond with stopping her bronchodilator regimen.  Plan to restart her Symbicort, refill her albuterol to use if needed.  I will assess her back in 2 months to ensure that she is improving back on BD therapy.  Baltazar Apo, MD, PhD 06/16/2020, 1:45 PM [facility] [phone] or if no answer [phone]

## 2020-06-16 NOTE — Assessment & Plan Note (Signed)
Increased dyspnea, increased wheeze that correspond with stopping her bronchodilator regimen.  Plan to restart her Symbicort, refill her albuterol to use if needed.  I will assess her back in 2 months to ensure that she is improving back on BD therapy.

## 2020-06-16 NOTE — Patient Instructions (Addendum)
Chest x-ray today We will restart Symbicort 2 puffs twice a day.  Rinse and gargle after using.  Take this every day on a schedule Keep albuterol available so that you can use 2 puffs if you need it for shortness of breath, chest tightness, wheezing. Continue your Mucinex as you have been taking it. Do your flutter valve once every day Agree with restarting your cetirizine (Zyrtec) once daily. Restart your fluticasone nasal spray, 2 sprays each nostril twice a day. Follow with Dr. [name] in 2 months or sooner if you have any problems.

## 2020-06-16 NOTE — Addendum Note (Signed)
Addended by: Gavin Potters R on: 06/16/2020 02:08 PM   Modules accepted: Orders

## 2020-06-16 NOTE — Assessment & Plan Note (Signed)
She just restarted her cetirizine but is not yet back on her fluticasone nasal spray.  We will get this restarted now

## 2020-06-16 NOTE — Assessment & Plan Note (Signed)
Her CT chest has been stable, most recent September 2021.  Plan to repeat her chest x-ray today

## 2020-06-16 NOTE — Assessment & Plan Note (Signed)
Stable CT 10/2019, no evidence exacerbation.  Minimal cough and sputum burden.  Continue flutter valve daily, Mucinex.

## 2020-07-07 ENCOUNTER — Other Ambulatory Visit: Payer: Self-pay | Admitting: Emergency Medicine

## 2020-07-10 ENCOUNTER — Telehealth: Payer: Self-pay

## 2020-07-10 MED ORDER — ALBUTEROL SULFATE HFA 108 (90 BASE) MCG/ACT IN AERS: 2.0000 | INHALATION_SPRAY | Freq: Four times a day (QID) | RESPIRATORY_TRACT | 11 refills | Status: DC | PRN

## 2020-07-10 NOTE — Telephone Encounter (Signed)
RX for Albuterol HFA inhaler sent in to [facility].

## 2020-07-10 NOTE — Telephone Encounter (Signed)
Created in error

## 2020-07-20 NOTE — Progress Notes (Signed)
Cardiology Office Note:    Date:  07/21/2020   ID:  SYREETA FIGLER, DOB 02-01-1935, MRN [id number]  PCP:  [name], MD   [facility] Providers Cardiologist:  Sherren Mocha, MD Cardiology APP:  Sharmon Revere     Referring MD: [name], MD   Chief Complaint:  Follow-up (CAD, AFib) and Shortness of Breath    Patient Profile:    [name] is a 86 y.o. female with:   Coronary artery disease ? Cath 3/17: oLCx 60-70 (neg by FFR) ? Myoview 11/16: no ischemia ? Myoview 07/2018: EF 73, prob low risk (significant extracardiac uptake), no ischemia  Paroxysmal atrial fibrillation ? CHA2DS2-VASc=5 (CAD, HTN, age x 2, female) >> Apixaban 2.5 mg (> 55 yo, wt <60 kg)  Pulmonary sarcoidosis  Bronchiectasis  Hypertension   Hyperlipidemia   SVT  Carotid stenosis ? [place] 06/2018: bilat 1-39  Prior CV studies: Echocardiogram 09/23/19 EF 55-60, GR 1 DD, RVSP 24.2, trivial MR  Echocardiogram 07/19/2019 EF 55-60, mild LVH, GR 1 DD, RVSP 36.9, trivial MR  Myoview 08/02/2018 EF: 73%, sig extracardiac uptake at rest, prob low risk  Carotid US 07/09/2018 Bilat ICA 1-39  Carotid US 10/17 bilat ICA 1-39>> FUprn  LHC 05/20/15 LAD plaque - nonobstructive LCx ost 70% >> FFR 0.99 > 0.95 (not hemodynamically significant); mid 40% RCA mid 30% EF 55-65%  Myoview 11/16 EF 75%, no ischemia, low risk  High Resolution CT 12/02/14 IMPRESSION: 1. No findings to suggest interstitial lung disease. 2. The overall appearance the chest is very similar to the prior study 11/28/2012. Findings are nonspecific, but could be seen in the setting of reported sarcoidosis, as detailed above. 3. Areas of bronchiectasis are noted in the right lung, most severe in the medial segment of the right middle lobe where there are areas of cystic bronchiectasis and architectural distortion. 4. Atherosclerosis, including left main and 3 vessel coronary artery disease.  Assessment for potential risk factor modification, dietary therapy or pharmacologic therapy may be warranted, if clinically indicated. 5. Additional incidental findings, as above.  Myoview 10/15 Normal stress nuclear study.LV Ejection Fraction: 70%.  Echo 10/15 EF 60-65%, no RWMA  Carotid US 9/15 Bilateral 1-39% ICA >> FU 2 years   History of Present Illness: Ms. Mcdade was last seen in 11/21.  She returns for f/u.  She is here alone.  However, she put her daughter [name]) on speaker phone.  Over the past few months, she has noted significant dyspnea with exertion.  She describes NYHA III symptoms.  She sleeps on 1 pillow chronically without change.  She has some pedal edema at times.  Her abdomen does feel full.  Since March, her weight is increased 5 pounds.  She has not had chest discomfort, syncope.  She did see Dr. [name] in April.  Her inhaled medications were adjusted somewhat.  She has not noticed any significant improvement in her breathing since that time.  Chest x-ray at that time did not demonstrate any edema.          Past Medical History:  Diagnosis Date  . Allergy    SEASONAL  . Anemia   . Arthritis   . Asthma    "slight"   . Baker's cyst of knee, right   . CAD (coronary artery disease)    a. Myoview 10/15 - normal EF 70% // Myoview 11/16: EF 75%, normal perfusion, (there was no blood pressure drop at low level exercise with Lexiscan infusion), low  risk study // c. LHC 3/17 - LAD irregs, oLCx 70 (neg FFR), mRCA 30, EF 55-65% >> med Rx // Myoview 07/2018:  EF 73, extracardiac uptake, no significant ischemia (reviewed with Dr. Burt Knack), Low Risk   . Carotid stenosis    a. Carotid US 9/15 - Bilateral 1-39% ICA >> FU 2 years // b. Bilateral ICA 1-39 >> FU prn // Carotid US 06/2018: bilat 1-39; fu prn  . Dyspnea    due to Sarcoidosis. When is windy or humed  . Dysrhythmia    fast heart rate  . Elbow fracture 04/2017   Right, had surgery  . Esophageal reflux   .  Hematochezia   . History of echocardiogram    a. Echo 10/15 - EF 60-65%, no RWMA  . HLD (hyperlipidemia)   . Hx of colonoscopy   . Osteoporosis   . Other chronic pulmonary heart diseases   . Paroxysmal supraventricular tachycardia ([place])   . Pneumonia   . Sarcoidosis     Current Medications: Current Meds  Medication Sig  . acetaminophen (TYLENOL) 500 MG tablet Take 1,000 mg by mouth every 6 (six) hours as needed for mild pain.   Marland Kitchen albuterol (VENTOLIN HFA) 108 (90 Base) MCG/ACT inhaler Inhale 2 puffs into the lungs every 4 (four) hours as needed for wheezing or shortness of breath.  Marland Kitchen apixaban (ELIQUIS) 2.5 MG TABS tablet Take 1 tablet (2.5 mg total) by mouth 2 (two) times daily.  . calcium carbonate (TUMS - DOSED IN MG ELEMENTAL CALCIUM) 500 MG chewable tablet Chew 1 tablet by mouth daily as needed for indigestion.   . cetirizine (ZYRTEC) 10 MG tablet Take 1 tablet (10 mg total) by mouth daily.  Marland Kitchen denosumab (PROLIA) 60 MG/ML SOLN injection Inject 60 mg into the skin every 6 (six) months. Administer in upper arm, thigh, or abdomen  . dextromethorphan (DELSYM) 30 MG/5ML liquid Take 30 mg by mouth 2 (two) times daily as needed for cough.  . diltiazem (CARDIZEM CD) 180 MG 24 hr capsule Take 180 mg by mouth at bedtime.   . docusate sodium (COLACE) 50 MG capsule Take 100 mg by mouth at bedtime.   . fluticasone (FLONASE) 50 MCG/ACT nasal spray Place 2 sprays into both nostrils 2 (two) times daily.  . furosemide (LASIX) 20 MG tablet Take 1 tablet (20 mg total) by mouth as directed.  Marland Kitchen ipratropium (ATROVENT HFA) 17 MCG/ACT inhaler Inhale 1 puff into the lungs 2 (two) times daily.  . melatonin 5 MG TABS Take 10 mg by mouth at bedtime.  . methylPREDNISolone (MEDROL DOSEPAK) 4 MG TBPK tablet follow package directions  . nitroGLYCERIN (NITROSTAT) 0.4 MG SL tablet Place 1 tablet (0.4 mg total) under the tongue every 5 (five) minutes as needed for chest pain.  Marland Kitchen omeprazole (PRILOSEC) 40 MG capsule Take  40 mg by mouth 2 (two) times daily.   . ondansetron (ZOFRAN-ODT) 4 MG disintegrating tablet Take 4 mg by mouth every 4 (four) hours as needed for nausea or vomiting.   Marland Kitchen oxybutynin (DITROPAN-XL) 10 MG 24 hr tablet Take 10 mg by mouth at bedtime.  Marland Kitchen oxyCODONE-acetaminophen (PERCOCET) 5-325 MG tablet Take 1 tablet by mouth every 4 (four) hours as needed (max 6 q for post op pain).  . polyethylene glycol (MIRALAX / GLYCOLAX) 17 g packet Take 17 g by mouth daily.  . rosuvastatin (CRESTOR) 10 MG tablet Take 1 tablet (10 mg total) by mouth 3 (three) times a week. Monday, Wednesday and Friday.  Marland Kitchen  Vitamin D, Ergocalciferol, (DRISDOL) 50000 units CAPS capsule Take 50,000 Units by mouth every Friday.   . [DISCONTINUED] apixaban (ELIQUIS) 5 MG TABS tablet Take 5 mg by mouth 2 (two) times daily.     Allergies:   Levaquin [levofloxacin], Sulfonamide derivatives, Oxycodone, and Morphine   Social History   Tobacco Use  . Smoking status: Never Smoker  . Smokeless tobacco: Never Used  Vaping Use  . Vaping Use: Never used  Substance Use Topics  . Alcohol use: No  . Drug use: No     Family Hx: The patient's family history includes Arrhythmia in her father; Cancer in her father, mother, and sister; Colon cancer (age of onset: 21) in her mother; Heart attack in her brother; Heart disease in her father; Lung cancer in her father. There is no history of Esophageal cancer, Rectal cancer, Stomach cancer, or Stroke.  Review of Systems  Constitutional: Negative for chills and fever.  Respiratory: Negative for cough.   Skin: Negative for rash.  Musculoskeletal: Positive for arthritis and joint pain.  Gastrointestinal: Positive for constipation (chronic). Negative for hematochezia and melena.  Genitourinary: Negative for hematuria.  Psychiatric/Behavioral: Positive for memory loss.     EKGs/Labs/Other Test Reviewed:    EKG:  EKG is  ordered today.  The ekg ordered today demonstrates normal sinus rhythm, HR  62, LAD, septal Q waves, QTc 414 ms, no ST-TW changes, no change from prior tracing   Recent Labs: 09/23/2019: TSH 0.787 09/26/2019: Magnesium 2.3 11/07/2019: ALT 7; NT-Pro BNP 520 11/26/2019: BUN 20; Creatinine, Ser 0.81; Hemoglobin 12.7; Platelets 237; Potassium 4.3; Sodium 139   Recent Lipid Panel Lab Results  Component Value Date/Time   CHOL 149 11/27/2017 08:00 AM   TRIG 67 11/27/2017 08:00 AM   HDL 73 11/27/2017 08:00 AM   LDLCALC 63 11/27/2017 08:00 AM      Risk Assessment/Calculations:    CHA2DS2-VASc Score = 5  This indicates a 7.2% annual risk of stroke. The patient's score is based upon: CHF History: No HTN History: Yes Diabetes History: No Stroke History: No Vascular Disease History: Yes Age Score: 2 Gender Score: 1     Physical Exam:    VS:  BP (!) 120/40   Pulse 69   Ht _0  (1.6 m)   Wt 127 lb 3.2 oz (57.7 kg)   LMP 02/22/1983 (Approximate)   SpO2 95%   BMI 22.53 kg/m     Wt Readings from Last 3 Encounters:  07/21/20 127 lb 3.2 oz (57.7 kg)  06/16/20 124 lb 9.6 oz (56.5 kg)  05/01/20 124 lb (56.2 kg)     Constitutional:      Appearance: Healthy appearance. Not in distress.  Neck:     Comments: ? Slightly elevated JVP Pulmonary:     Effort: Pulmonary effort is normal.     Breath sounds: No wheezing. Rales (??faint bibasilar crackles ) present.  Cardiovascular:     Normal rate. Regular rhythm. Normal S1. Normal S2.     Murmurs: There is no murmur.     No gallop.  Edema:    Ankle: bilateral trace edema of the ankle. Abdominal:     General: There is distension.     Palpations: Abdomen is soft.  Skin:    General: Skin is warm and dry.  Neurological:     Mental Status: Alert and oriented to person, place and time.     Cranial Nerves: Cranial nerves are intact.  ASSESSMENT & PLAN:    1. Shortness of breath Potential etiologies for her shortness of breath include volume overload, anginal equivalent, worsening pulmonary disease,  deconditioning from arthritis.  She does have very minimal evidence on exam of excess volume.  Therefore, I have recommended a trial of furosemide x3 days.  She has not had any chest discomfort but she has had moderate disease in the LCx on cardiac catheterization in the past.  This was negative by FFR in 2017.  She had a low risk Myoview in 2020.  I recommended proceeding with stress testing to rule out ischemia as a cause.  If her cardiac and pulmonary work-up continue to remain unremarkable, question if this could all be related to deconditioning from severe arthritis in her knee.  She is now using a cane and struggling to walk because of the pain.  She has a joint injection scheduled later this week.  -Lexiscan Myoview  -CMET, CBC, BNP  -Lasix 20 mg once daily x 3 days, then prn for wt gain > 3 lbs in 1 day  -If BNP elevated, remain on Lasix daily  -If EF down on Myoview, repeat echocardiogram  2. Coronary artery disease without angina pectoris Mod non-obstructive disease on cath in 2017.  Low risk Myoview in 2020.  Obtain Myoview as noted to rule out ischemia as a cause for her symptoms.  She is not on aspirin as she is on Apixaban.  Continue rosuvastatin.  3. Paroxysmal atrial fibrillation (HCC) Maintaining sinus rhythm.  She is tolerating anticoagulation.  Obtain c-Met, CBC as noted.  Continue apixaban 2.5 mg twice daily.  4. Essential hypertension The patient's blood pressure is controlled on her current regimen.  Continue current therapy.   5. Mixed hyperlipidemia Continue rosuvastatin.  6. Pulmonary Sarcoidosis  Continue follow-up with pulmonology as planned.    Shared Decision Making/Informed Consent The risks [chest pain, shortness of breath, cardiac arrhythmias, dizziness, blood pressure fluctuations, myocardial infarction, stroke/transient ischemic attack, nausea, vomiting, allergic reaction, radiation exposure, metallic taste sensation and life-threatening complications  (estimated to be 1 in 10,000)], benefits (risk stratification, diagnosing coronary artery disease, treatment guidance) and alternatives of a nuclear stress test were discussed in detail with Ms. Gutmann and she agrees to proceed.      Dispo:  Return in about 2 months (around 09/20/2020) for Routine follow up in 2 months with [name], PA. .   Medication Adjustments/Labs and Tests Ordered: Current medicines are reviewed at length with the patient today.  Concerns regarding medicines are outlined above.  Tests Ordered: Orders Placed This Encounter  Procedures  . Comp Met (CMET)  . Pro b natriuretic peptide  . CBC  . Cardiac Stress Test: Informed Consent Details: Physician/Practitioner Attestation; Transcribe to consent form and obtain patient signature  . Myocardial Perfusion Imaging  . EKG 12-Lead   Medication Changes: Meds ordered this encounter  Medications  . furosemide (LASIX) 20 MG tablet    Sig: Take 1 tablet (20 mg total) by mouth as directed.    Dispense:  30 tablet    Refill:  1    Signed, [name], PA-C  07/21/2020 1:23 PM    [facility] [place], [place], [place]  [address] Phone: [phone]; Fax: [phone]

## 2020-07-21 ENCOUNTER — Other Ambulatory Visit: Payer: Self-pay

## 2020-07-21 ENCOUNTER — Other Ambulatory Visit: Payer: Self-pay | Admitting: Physician Assistant

## 2020-07-21 ENCOUNTER — Ambulatory Visit (INDEPENDENT_AMBULATORY_CARE_PROVIDER_SITE_OTHER): Payer: Medicare Other | Admitting: Physician Assistant

## 2020-07-21 ENCOUNTER — Encounter: Payer: Self-pay | Admitting: Physician Assistant

## 2020-07-21 VITALS — BP 120/40 | HR 69 | Ht 63.0 in | Wt 127.2 lb

## 2020-07-21 DIAGNOSIS — R0602 Shortness of breath: Secondary | ICD-10-CM

## 2020-07-21 DIAGNOSIS — I10 Essential (primary) hypertension: Secondary | ICD-10-CM

## 2020-07-21 DIAGNOSIS — I2510 Atherosclerotic heart disease of native coronary artery without angina pectoris: Secondary | ICD-10-CM

## 2020-07-21 DIAGNOSIS — I480 Paroxysmal atrial fibrillation: Secondary | ICD-10-CM

## 2020-07-21 DIAGNOSIS — D869 Sarcoidosis, unspecified: Secondary | ICD-10-CM

## 2020-07-21 DIAGNOSIS — E782 Mixed hyperlipidemia: Secondary | ICD-10-CM

## 2020-07-21 MED ORDER — FUROSEMIDE 20 MG PO TABS: 20.0000 mg | ORAL_TABLET | ORAL | 1 refills | Status: DC

## 2020-07-21 NOTE — Patient Instructions (Signed)
Medication Instructions:  Your physician has recommended you make the following change in your medication:   1.  Make sure you are taking Eliquis one tablet by mouth (2.5 mg) twice daily.  2.  Start Lasix one tablet by mouth ( 20 mg) X 3 days and than as needed for swelling or weight gain of 3 lbs in 24            Hours.  *If you need a refill on your cardiac medications before your next appointment, please call your pharmacy*   Lab Work: TODAY!!!!  CMET/PRO BNP/CBC     If you have labs (blood work) drawn today and your tests are completely normal, you will receive your results only by: Marland Kitchen MyChart Message (if you have MyChart) OR . A paper copy in the mail If you have any lab test that is abnormal or we need to change your treatment, we will call you to review the results.   Testing/Procedures: You are scheduled for a Myocardial Perfusion Imaging Study on Wednesday, June 15 @ 8:00 am.  Please arrive 15 minutes prior to your appointment time for registration and insurance purposes.   The test will take approximately 3 to 4 hours to complete; you may bring reading material. If someone comes with you to your appointment, they will need to remain in the main lobby due to limited space in the testing area.   How to prepare for your Myocardial Perfusion test:   Do not eat or drink 3 hours prior to your test, except you may have water.    Do not consume products containing caffeine (regular or decaffeinated) 12 hours prior to your test (ex: coffee, chocolate, soda, tea)   Do bring a list of your current medications with you. If not listed below, you may take your medications as normal.    Bring any held medication to your appointment, as you may be required to take it once the test is complete.   Do wear comfortable clothes (no dresses or overalls) and walking shoes. Tennis shoes are preferred. No heels or open toed shoes.  Do not wear cologne, perfume, aftershave or lotions (deodorant  is allowed).   If these instructions are not followed, you test will have to be rescheduled.   Please report to [address] for your test. If you have questions or concerns about your appointment, please call the Nuclear Lab at [phone].  If you cannot keep your appointment, please provide 24 hour notification to the Nuclear lab to avoid a possible $50 charge to your account.    Follow-Up: At [facility], you and your health needs are our priority.  As part of our continuing mission to provide you with exceptional heart care, we have created designated Provider Care Teams.  These Care Teams include your primary Cardiologist (physician) and Advanced Practice Providers (APPs -  Physician Assistants and Nurse Practitioners) who all work together to provide you with the care you need, when you need it.  We recommend signing up for the patient portal called "MyChart".  Sign up information is provided on this After Visit Summary.  MyChart is used to connect with patients for Virtual Visits (Telemedicine).  Patients are able to view lab/test results, encounter notes, upcoming appointments, etc.  Non-urgent messages can be sent to your provider as well.   To learn more about what you can do with MyChart, go to [email or web address].    Your next appointment:   1 month(s)  with [name], PA on Wednesday, July 6 @ 8:45 am.  The format for your next appointment:   In Person  Provider:   [name], PA-C   Other Instructions -None

## 2020-07-22 ENCOUNTER — Other Ambulatory Visit: Payer: Self-pay | Admitting: *Deleted

## 2020-07-22 DIAGNOSIS — Z79899 Other long term (current) drug therapy: Secondary | ICD-10-CM

## 2020-07-22 DIAGNOSIS — I2510 Atherosclerotic heart disease of native coronary artery without angina pectoris: Secondary | ICD-10-CM

## 2020-07-22 DIAGNOSIS — I480 Paroxysmal atrial fibrillation: Secondary | ICD-10-CM

## 2020-07-22 DIAGNOSIS — R0602 Shortness of breath: Secondary | ICD-10-CM

## 2020-07-22 HISTORY — PX: CATARACT EXTRACTION: SUR2

## 2020-07-22 LAB — CBC
Hematocrit: 38.4 % (ref 34.0–46.6)
Hemoglobin: 12.7 g/dL (ref 11.1–15.9)
MCH: 31.1 pg (ref 26.6–33.0)
MCHC: 33.1 g/dL (ref 31.5–35.7)
MCV: 94 fL (ref 79–97)
Platelets: 309 10*3/uL (ref 150–450)
RBC: 4.08 x10E6/uL (ref 3.77–5.28)
RDW: 12.7 % (ref 11.7–15.4)
WBC: 8.9 10*3/uL (ref 3.4–10.8)

## 2020-07-22 LAB — COMPREHENSIVE METABOLIC PANEL
ALT: 8 IU/L (ref 0–32)
AST: 14 IU/L (ref 0–40)
Albumin/Globulin Ratio: 2.2 (ref 1.2–2.2)
Albumin: 4 g/dL (ref 3.6–4.6)
Alkaline Phosphatase: 95 IU/L (ref 44–121)
BUN/Creatinine Ratio: 24 (ref 12–28)
BUN: 22 mg/dL (ref 8–27)
Bilirubin Total: 0.3 mg/dL (ref 0.0–1.2)
CO2: 24 mmol/L (ref 20–29)
Calcium: 9.5 mg/dL (ref 8.7–10.3)
Chloride: 101 mmol/L (ref 96–106)
Creatinine, Ser: 0.91 mg/dL (ref 0.57–1.00)
Globulin, Total: 1.8 g/dL (ref 1.5–4.5)
Glucose: 120 mg/dL — ABNORMAL HIGH (ref 65–99)
Potassium: 4.8 mmol/L (ref 3.5–5.2)
Sodium: 140 mmol/L (ref 134–144)
Total Protein: 5.8 g/dL — ABNORMAL LOW (ref 6.0–8.5)
eGFR: 62 mL/min/{1.73_m2} (ref >59)

## 2020-07-22 LAB — PRO B NATRIURETIC PEPTIDE: NT-Pro BNP: 1523 pg/mL — ABNORMAL HIGH (ref 0–738)

## 2020-07-22 MED ORDER — FUROSEMIDE 20 MG PO TABS: 20.0000 mg | ORAL_TABLET | Freq: Every day | ORAL | 3 refills | Status: DC

## 2020-07-28 ENCOUNTER — Telehealth (HOSPITAL_COMMUNITY): Payer: Self-pay | Admitting: *Deleted

## 2020-07-28 ENCOUNTER — Encounter (HOSPITAL_COMMUNITY): Payer: Self-pay | Admitting: *Deleted

## 2020-07-28 ENCOUNTER — Telehealth: Payer: Self-pay | Admitting: *Deleted

## 2020-07-28 NOTE — Telephone Encounter (Signed)
   [facility] Pre-operative Risk Assessment    Patient Name: [name]  DOB: 07/26/1934  MRN: [id number]   HEARTCARE STAFF: - Please ensure there is not already an duplicate clearance open for this procedure. - Under Visit Info/Reason for Call, type in Other and utilize the format Clearance MM/DD/YY or Clearance TBD. Do not use dashes or single digits. - If request is for dental extraction, please clarify the # of teeth to be extracted. - If the patient is currently at the dentist's office, call Pre-Op APP to address. If the patient is not currently in the dentist office, please route to the Pre-Op pool  Request for surgical clearance:  1. What type of surgery is being performed?  URGENT VITRECTOMY, LENSECTOMY, & INTRAVITREAL TRIESENCE   2. When is this surgery scheduled?  07/30/2020   3. What type of clearance is required (medical clearance vs. Pharmacy clearance to hold med vs. Both)?  BOTH  4. Are there any medications that need to be held prior to surgery and how long? ELIQUIS X'S 2   5. Practice name and name of physician performing surgery?  [facility] / DR. SANDERS   6. What is the office phone number?  [phone]   7.   What is the office fax number?  [phone]  8.   Anesthesia type (None, local, MAC, general) ?  MAC   Jeanann Lewandowsky 07/28/2020, 3:14 PM  _________________________________________________________________   (provider comments below)

## 2020-07-28 NOTE — Telephone Encounter (Signed)
    [name] DOB:  [birth year]  MRN:  [id number]   Primary Cardiologist: Sherren Mocha, MD  Chart reviewed as part of pre-operative protocol coverage. The patient was recently seen by [name], PA in follow up on 07/21/20 at which time the patient had c/o of worsening SOB. She has been scheduled for labs to be performed tomorrow and was scheduled for an OP Lexiscan stress test along with an echocardiogram. Today she underwent cataract surgery with complication and has been referred for urgent vitrectomy, lensectomy and intravitrial triesence on 07/30/20. I spoke with the patient and daughter today and while her cardiac workup is essential, this is certainly not prohibitive to her eye surgery. I will copy her primary cardiologist along with [name] PA for them to follow.   The patient was advised that once the surgery is complete, we will proceed with further cardiac workup at that time.   Patient with diagnosis of afib on Eliquis for anticoagulation.    Procedure: URGENT VITRECTOMY, LENSECTOMY, & INTRAVITREAL TRIESENCE  Date of procedure: 07/30/20  CHA2DS2-VASc Score = 5  This indicates a 7.2% annual risk of stroke. The patient's score is based upon: CHF History: No HTN History: Yes Diabetes History: No Stroke History: No Vascular Disease History: Yes Age Score: 2 Gender Score: 1  CrCl 25mL/min Platelet count 309K  Per office protocol, patient can hold Eliquis for 2 days prior to procedure as requested.  I will call and discuss this with the patient and her daughter.   I will route this recommendation to the requesting party via Epic fax function and remove from pre-op pool.  Please call with questions.  Kathyrn Drown, NP 07/28/2020, 4:08 PM

## 2020-07-28 NOTE — Telephone Encounter (Signed)
Patient with diagnosis of afib on Eliquis for anticoagulation.    Procedure: URGENT VITRECTOMY, LENSECTOMY, & INTRAVITREAL TRIESENCE   Date of procedure: 07/30/20  CHA2DS2-VASc Score = 5  This indicates a 7.2% annual risk of stroke. The patient's score is based upon: CHF History: No HTN History: Yes Diabetes History: No Stroke History: No Vascular Disease History: Yes Age Score: 2 Gender Score: 1  CrCl 9mL/min Platelet count 309K  Per office protocol, patient can hold Eliquis for 2 days prior to procedure as requested.

## 2020-07-28 NOTE — Telephone Encounter (Signed)
Patient's daughter per DPR was given detailed instructions per Myocardial Perfusion Study Information Sheet for the test on 08/05/20 at 0800. Patient notified to arrive 15 minutes early and that it is imperative to arrive on time for appointment to keep from having the test rescheduled.  If you need to cancel or reschedule your appointment, please call the office within 24 hours of your appointment. . Patient verbalized understanding.[facility] Mychart letter sent with instructions

## 2020-07-28 NOTE — Telephone Encounter (Signed)
Agree. [name]! [name], PA-C    07/28/2020 4:51 PM

## 2020-07-29 ENCOUNTER — Other Ambulatory Visit: Payer: Medicare Other | Admitting: *Deleted

## 2020-07-29 ENCOUNTER — Other Ambulatory Visit: Payer: Self-pay

## 2020-07-29 DIAGNOSIS — I480 Paroxysmal atrial fibrillation: Secondary | ICD-10-CM

## 2020-07-29 DIAGNOSIS — R0602 Shortness of breath: Secondary | ICD-10-CM

## 2020-07-29 DIAGNOSIS — I2510 Atherosclerotic heart disease of native coronary artery without angina pectoris: Secondary | ICD-10-CM

## 2020-07-29 DIAGNOSIS — Z79899 Other long term (current) drug therapy: Secondary | ICD-10-CM

## 2020-07-29 NOTE — Telephone Encounter (Signed)
Agree thank you 

## 2020-07-30 LAB — BASIC METABOLIC PANEL
BUN/Creatinine Ratio: 29 — ABNORMAL HIGH (ref 12–28)
BUN: 23 mg/dL (ref 8–27)
CO2: 22 mmol/L (ref 20–29)
Calcium: 9.2 mg/dL (ref 8.7–10.3)
Chloride: 101 mmol/L (ref 96–106)
Creatinine, Ser: 0.79 mg/dL (ref 0.57–1.00)
Glucose: 152 mg/dL — ABNORMAL HIGH (ref 65–99)
Potassium: 4 mmol/L (ref 3.5–5.2)
Sodium: 138 mmol/L (ref 134–144)
eGFR: 74 mL/min/{1.73_m2} (ref >59)

## 2020-07-30 LAB — PRO B NATRIURETIC PEPTIDE: NT-Pro BNP: 836 pg/mL — ABNORMAL HIGH (ref 0–738)

## 2020-07-31 ENCOUNTER — Telehealth: Payer: Self-pay | Admitting: Cardiovascular Disease

## 2020-07-31 NOTE — Telephone Encounter (Signed)
-----   Message from Liliane Shi, PA-C sent at 07/30/2020  9:07 AM EDT ----- Creatinine, K+ normal.  Sugar elevated. BNP improved. PLAN:  - Continue current medications/treatment plan and follow up as scheduled.  - F/u with PCP for elevated sugar.  Send copy to PCP [name], PA-C    07/30/2020 9:04 AM

## 2020-07-31 NOTE — Telephone Encounter (Signed)
Reviewed results with patient who verbalized understanding.   She will follow-up with PCP for elevated sugars. Results sent to Dr. Dagmar Hait.

## 2020-07-31 NOTE — Telephone Encounter (Signed)
Pt is calling in regards to her Lab work results from 07/29/20. Please advise

## 2020-08-04 ENCOUNTER — Telehealth: Payer: Self-pay | Admitting: Cardiovascular Disease

## 2020-08-04 NOTE — Telephone Encounter (Signed)
   Pt would like to speak with the nurse again regarding her labs result, she said she is having issue with her protein and have some other questions

## 2020-08-04 NOTE — Telephone Encounter (Signed)
She has seen neuro (Dr. Jaynee Eagles) in the past for memory loss (2019).  She saw Ward Givens, NP at neuro for f/u earlier this year.  Let's have her f/u there for re-evaluation. Thanks, [name], PA-C    08/04/2020 4:38 PM

## 2020-08-04 NOTE — Telephone Encounter (Signed)
Reviewed lab results with the patient and her daughter. Confirmed stress test appointment tomorrow. The patient's daughter would like Nicki Reaper to know the patient is still experiencing memory issues and would like to know his recommendations.

## 2020-08-05 ENCOUNTER — Other Ambulatory Visit: Payer: Self-pay

## 2020-08-05 ENCOUNTER — Encounter: Payer: Self-pay | Admitting: Physician Assistant

## 2020-08-05 ENCOUNTER — Ambulatory Visit (HOSPITAL_COMMUNITY): Payer: Medicare Other | Attending: Cardiovascular Disease

## 2020-08-05 VITALS — Ht 63.0 in | Wt 127.0 lb

## 2020-08-05 DIAGNOSIS — D869 Sarcoidosis, unspecified: Secondary | ICD-10-CM | POA: Diagnosis present

## 2020-08-05 DIAGNOSIS — R110 Nausea: Secondary | ICD-10-CM

## 2020-08-05 DIAGNOSIS — I2510 Atherosclerotic heart disease of native coronary artery without angina pectoris: Secondary | ICD-10-CM

## 2020-08-05 DIAGNOSIS — I480 Paroxysmal atrial fibrillation: Secondary | ICD-10-CM

## 2020-08-05 DIAGNOSIS — R0602 Shortness of breath: Secondary | ICD-10-CM

## 2020-08-05 DIAGNOSIS — I10 Essential (primary) hypertension: Secondary | ICD-10-CM

## 2020-08-05 DIAGNOSIS — E782 Mixed hyperlipidemia: Secondary | ICD-10-CM | POA: Diagnosis present

## 2020-08-05 LAB — MYOCARDIAL PERFUSION IMAGING
LV dias vol: 51 mL (ref 46–106)
LV sys vol: 14 mL
Peak HR: 69 {beats}/min
Rest HR: 52 {beats}/min
SDS: 1
SRS: 0
SSS: 1
TID: 0.92

## 2020-08-05 MED ORDER — REGADENOSON 0.4 MG/5ML IV SOLN
0.4000 mg | Freq: Once | INTRAVENOUS | Status: AC
  Administered 2020-08-05: 0.4 mg via INTRAVENOUS

## 2020-08-05 MED ORDER — TECHNETIUM TC 99M TETROFOSMIN IV KIT
10.1000 | PACK | Freq: Once | INTRAVENOUS | Status: AC | PRN
  Administered 2020-08-05: 10.1 via INTRAVENOUS
  Filled 2020-08-05: qty 11

## 2020-08-05 MED ORDER — TECHNETIUM TC 99M TETROFOSMIN IV KIT
31.7000 | PACK | Freq: Once | INTRAVENOUS | Status: AC | PRN
  Administered 2020-08-05: 31.7 via INTRAVENOUS
  Filled 2020-08-05: qty 32

## 2020-08-05 MED ORDER — AMINOPHYLLINE 25 MG/ML IV SOLN
75.0000 mg | Freq: Once | INTRAVENOUS | Status: AC
  Administered 2020-08-05: 75 mg via INTRAVENOUS

## 2020-08-05 NOTE — Telephone Encounter (Signed)
Lvm  for daughter to call back to give [name]'s recommendation's about pt's memory issues.

## 2020-08-05 NOTE — Telephone Encounter (Signed)
Patient's daughter is returning call.

## 2020-08-05 NOTE — Telephone Encounter (Signed)
S/w pt's daughter and gave Scotts recommendations. Agreeable to treatment plan.

## 2020-08-17 ENCOUNTER — Ambulatory Visit: Payer: Medicare Other | Admitting: Emergency Medicine

## 2020-08-25 NOTE — Progress Notes (Signed)
Cardiology Office Note:    Date:  08/26/2020   ID:  [name], DOB [birth year], MRN [id number]  PCP:  Sherri Solian, MD   [facility] Providers Cardiologist:  Sherri Mocha, MD Cardiology APP:  [name]      Referring MD: Sherri Solian, MD   Chief Complaint:  Follow-up (CHF)    Patient Profile:    [name] is a 86 y.o. female with:  Coronary artery disease Cath 3/17: oLCx 60-70 (neg by FFR) Myoview 11/16: no ischemia Myoview 07/2018: EF 73, prob low risk (significant extracardiac uptake), no ischemia Myoview 6/22: low risk  Paroxysmal atrial fibrillation  CHA2DS2-VASc=5 (CAD, HTN, age x 2, female) >> Apixaban 2.5 mg (> 51 yo, wt <60 kg)  (HFpEF) heart failure with preserved ejection fraction  Echo 8/21: EF 55-60, GR 1 DD, RVSP 24.2, trivial MR Pulmonary sarcoidosis Echo 8/21: RVSP 24.2 Echo 5/21: RVSP 36.9 Bronchiectasis Hypertension Hyperlipidemia SVT Carotid stenosis [place] 06/2018: bilat 1-39   Prior CV studies: GATED SPECT MYO PERF W/LEXISCAN STRESS 1D 08/05/2020 Narrative  The left ventricular ejection fraction is hyperdynamic (>65%).  Nuclear stress EF: 72%.  There was no ST segment deviation noted during stress.  No T wave inversion was noted during stress.  The study is normal.  This is a low risk study.  Echocardiogram 09/23/19 EF 55-60, GR 1 DD, RVSP 24.2, trivial MR   Echocardiogram 07/19/2019 EF 55-60, mild LVH, GR 1 DD, RVSP 36.9, trivial MR   Myoview 08/02/2018 EF: 73%, sig extracardiac uptake at rest, prob low risk   Carotid US 07/09/2018 Bilat ICA 1-39   Carotid US 10/17 bilat ICA 1-39 >> FU prn   LHC 05/20/15 LAD plaque - nonobstructive LCx ost 70% >> FFR 0.99 > 0.95 (not hemodynamically significant); mid 40% RCA mid 30% EF 55-65%     History of Present Illness: [name] was last seen 07/21/20 with symptoms of shortness of breath.  A BNP was elevated and I put her on Furosemide.  A Myoview was  obtained and this was low risk.  She returns for f/u.  She is here alone.  Her daughter, [name], joined by speaker phone.  [name] notes that her breathing has improved.  She still gets short of breath with some activities.  She likes to sleep on 2 pillows.  She has not had syncope but does get dizzy sometimes.  She has not had lower extremity edema.  She did have cataract surgery recently.  However, the cataract on her right eye fractured and she has irritation and swelling around her eye.  She is on drops.    Past Medical History:  Diagnosis Date   Allergy    SEASONAL   Anemia    Arthritis    Asthma    "slight"    Baker's cyst of knee, right    CAD (coronary artery disease)    a. Myoview 10/15 - normal EF 70% // Myoview 11/16: EF 75%, normal perfusion, low risk // c. LHC 3/17 - LAD irregs, oLCx 70 (neg FFR), mRCA 30, EF 55-65% >> med Rx // Myoview 07/2018:  EF 73, extracardiac uptake, no significant ischemia (reviewed with Dr. Burt Knack), Low Risk // Myoview 6/22: EF 72, normal perfusion, low risk   Carotid stenosis    a. Carotid US 9/15 - Bilateral 1-39% ICA >> FU 2 years // b. Bilateral ICA 1-39 >> FU prn // Carotid US 06/2018: bilat 1-39; fu prn   Dyspnea  due to Sarcoidosis. When is windy or humed   Dysrhythmia    fast heart rate   Elbow fracture 04/2017   Right, had surgery   Esophageal reflux    Hematochezia    History of echocardiogram    a. Echo 10/15 - EF 60-65%, no RWMA   HLD (hyperlipidemia)    Hx of colonoscopy    Osteoporosis    Other chronic pulmonary heart diseases    Paroxysmal supraventricular tachycardia (HCC)    Pneumonia    Sarcoidosis     Current Medications: Current Meds  Medication Sig   acetaminophen (TYLENOL) 500 MG tablet Take 1,000 mg by mouth every 6 (six) hours as needed for mild pain.    albuterol (VENTOLIN HFA) 108 (90 Base) MCG/ACT inhaler Inhale 2 puffs into the lungs every 4 (four) hours as needed for wheezing or shortness of breath.    apixaban (ELIQUIS) 2.5 MG TABS tablet Take 1 tablet (2.5 mg total) by mouth 2 (two) times daily.   calcium carbonate (TUMS - DOSED IN MG ELEMENTAL CALCIUM) 500 MG chewable tablet Chew 1 tablet by mouth daily as needed for indigestion.    cetirizine (ZYRTEC) 10 MG tablet Take 1 tablet (10 mg total) by mouth daily.   denosumab (PROLIA) 60 MG/ML SOLN injection Inject 60 mg into the skin every 6 (six) months. Administer in upper arm, thigh, or abdomen   dextromethorphan (DELSYM) 30 MG/5ML liquid Take 30 mg by mouth 2 (two) times daily as needed for cough.   diltiazem (CARDIZEM CD) 180 MG 24 hr capsule Take 180 mg by mouth at bedtime.    docusate sodium (COLACE) 50 MG capsule Take 100 mg by mouth at bedtime.    fluticasone (FLONASE) 50 MCG/ACT nasal spray Place 2 sprays into both nostrils 2 (two) times daily.   furosemide (LASIX) 20 MG tablet Take 1 tablet (20 mg total) by mouth daily.   ipratropium (ATROVENT HFA) 17 MCG/ACT inhaler Inhale 1 puff into the lungs 2 (two) times daily.   melatonin 5 MG TABS Take 10 mg by mouth at bedtime.   nitroGLYCERIN (NITROSTAT) 0.4 MG SL tablet Place 1 tablet (0.4 mg total) under the tongue every 5 (five) minutes as needed for chest pain.   omeprazole (PRILOSEC) 40 MG capsule Take 40 mg by mouth 2 (two) times daily.    ondansetron (ZOFRAN-ODT) 4 MG disintegrating tablet Take 4 mg by mouth every 4 (four) hours as needed for nausea or vomiting.    oxybutynin (DITROPAN) 5 MG tablet Take 5 mg by mouth at bedtime.   oxyCODONE-acetaminophen (PERCOCET) 5-325 MG tablet Take 1 tablet by mouth every 4 (four) hours as needed (max 6 q for post op pain).   polyethylene glycol (MIRALAX / GLYCOLAX) 17 g packet Take 17 g by mouth daily.   rosuvastatin (CRESTOR) 10 MG tablet Take 1 tablet (10 mg total) by mouth 3 (three) times a week. Monday, Wednesday and Friday.   Vitamin D, Ergocalciferol, (DRISDOL) 50000 units CAPS capsule Take 50,000 Units by mouth every Friday.       Allergies:   Levaquin [levofloxacin], Sulfonamide derivatives, Oxycodone, and Morphine   Social History   Tobacco Use   Smoking status: Never   Smokeless tobacco: Never  Vaping Use   Vaping Use: Never used  Substance Use Topics   Alcohol use: No   Drug use: No     Family Hx: The patient's family history includes Arrhythmia in her father; Cancer in her father, mother, and sister; Colon  cancer (age of onset: 80) in her mother; Heart attack in her brother; Heart disease in her father; Lung cancer in her father. There is no history of Esophageal cancer, Rectal cancer, Stomach cancer, or Stroke.  Review of Systems  Psychiatric/Behavioral:  Positive for memory loss.     EKGs/Labs/Other Test Reviewed:    EKG:  EKG is not ordered today.  The ekg ordered today demonstrates n/a  Recent Labs: 09/23/2019: TSH 0.787 09/26/2019: Magnesium 2.3 07/21/2020: ALT 8; Hemoglobin 12.7; Platelets 309 07/29/2020: BUN 23; Creatinine, Ser 0.79; NT-Pro BNP 836; Potassium 4.0; Sodium 138   Recent Lipid Panel Lab Results  Component Value Date/Time   CHOL 149 11/27/2017 08:00 AM   TRIG 67 11/27/2017 08:00 AM   HDL 73 11/27/2017 08:00 AM   LDLCALC 63 11/27/2017 08:00 AM      Risk Assessment/Calculations:    CHA2DS2-VASc Score = 5  This indicates a 7.2% annual risk of stroke. The patient's score is based upon: CHF History: No HTN History: Yes Diabetes History: No Stroke History: No Vascular Disease History: Yes Age Score: 2 Gender Score: 1     Physical Exam:    VS:  BP (!) 112/42   Pulse 88   Ht 5\' 3"  (1.6 m)   Wt 125 lb (56.7 kg)   LMP 02/22/1983 (Approximate)   SpO2 98%   BMI 22.14 kg/m     Wt Readings from Last 3 Encounters:  08/26/20 125 lb (56.7 kg)  08/05/20 127 lb (57.6 kg)  07/21/20 127 lb 3.2 oz (57.7 kg)     Constitutional:      Appearance: Healthy appearance. Not in distress.  Eyes:     Comments: R eye - sclera injected; +upper lid edema  Neck:     Vascular: JVD  normal.  Pulmonary:     Effort: Pulmonary effort is normal.     Breath sounds: No wheezing. No rales.  Cardiovascular:     Normal rate. Regular rhythm. Normal S1. Normal S2.      Murmurs: There is no murmur.  Edema:    Peripheral edema absent.  Abdominal:     Palpations: Abdomen is soft.  Skin:    General: Skin is warm and dry.  Neurological:     Mental Status: Alert and oriented to person, place and time.     Cranial Nerves: Cranial nerves are intact.        ASSESSMENT & PLAN:    1. Chronic heart failure with preserved ejection fraction (HCC) EF normal by echocardiogram 8/21 with mild diastolic dysfunction.  BNP improved with diuresis.  Overall, volume status is currently stable.  Continue current dose of furosemide.  Obtain follow-up BMET today.  2. Coronary artery disease involving native coronary artery of native heart without angina pectoris Moderate nonobstructive disease by cardiac catheterization in 2017.  Recent Myoview low risk.  She is not having chest discomfort.  She is not on aspirin as she is on Apixaban.  Continue rosuvastatin.  3. Paroxysmal atrial fibrillation (HCC) She appears to be maintaining sinus rhythm by exam.  She is on the appropriate dose of Apixaban given her age and weight.  Continue current management.  4. Essential hypertension Blood pressure is controlled.  She does note occasional episodes of dizziness.  If her pressure starts to run lower, we can decrease her diltiazem to 120 mg daily.    5. Mixed hyperlipidemia Fair control.  Continue current dose of rosuvastatin.  6. Pulmonary Sarcoidosis  Continue follow-up with pulmonology.  7. Memory loss She plans to follow-up with neurology.    Dispo:  Return in about 6 months (around 02/26/2021) for Routine Follow Up, w/ [name], PA-C.   Medication Adjustments/Labs and Tests Ordered: Current medicines are reviewed at length with the patient today.  Concerns regarding medicines are outlined  above.  Tests Ordered: Orders Placed This Encounter  Procedures   Basic Metabolic Panel (BMET)   Medication Changes: No orders of the defined types were placed in this encounter.   Signed, [name], PA-C  08/26/2020 9:13 AM    [facility] [address], [place], [place]  [address] Phone: [phone]; Fax: [phone]

## 2020-08-26 ENCOUNTER — Encounter: Payer: Self-pay | Admitting: Physician Assistant

## 2020-08-26 ENCOUNTER — Ambulatory Visit (INDEPENDENT_AMBULATORY_CARE_PROVIDER_SITE_OTHER): Payer: Medicare Other | Admitting: Physician Assistant

## 2020-08-26 ENCOUNTER — Other Ambulatory Visit: Payer: Self-pay

## 2020-08-26 VITALS — BP 112/42 | HR 88 | Ht 63.0 in | Wt 125.0 lb

## 2020-08-26 DIAGNOSIS — I2510 Atherosclerotic heart disease of native coronary artery without angina pectoris: Secondary | ICD-10-CM | POA: Diagnosis not present

## 2020-08-26 DIAGNOSIS — I10 Essential (primary) hypertension: Secondary | ICD-10-CM

## 2020-08-26 DIAGNOSIS — I480 Paroxysmal atrial fibrillation: Secondary | ICD-10-CM | POA: Diagnosis not present

## 2020-08-26 DIAGNOSIS — D869 Sarcoidosis, unspecified: Secondary | ICD-10-CM

## 2020-08-26 DIAGNOSIS — E782 Mixed hyperlipidemia: Secondary | ICD-10-CM

## 2020-08-26 DIAGNOSIS — I5032 Chronic diastolic (congestive) heart failure: Secondary | ICD-10-CM | POA: Diagnosis not present

## 2020-08-26 DIAGNOSIS — R413 Other amnesia: Secondary | ICD-10-CM

## 2020-08-26 LAB — BASIC METABOLIC PANEL
BUN/Creatinine Ratio: 25 (ref 12–28)
BUN: 21 mg/dL (ref 8–27)
CO2: 21 mmol/L (ref 20–29)
Calcium: 9.4 mg/dL (ref 8.7–10.3)
Chloride: 103 mmol/L (ref 96–106)
Creatinine, Ser: 0.83 mg/dL (ref 0.57–1.00)
Glucose: 85 mg/dL (ref 65–99)
Potassium: 4.7 mmol/L (ref 3.5–5.2)
Sodium: 140 mmol/L (ref 134–144)
eGFR: 69 mL/min/{1.73_m2} (ref >59)

## 2020-08-26 NOTE — Patient Instructions (Signed)
Medication Instructions:   Your physician recommends that you continue on your current medications as directed. Please refer to the Current Medication list given to you today.  *If you need a refill on your cardiac medications before your next appointment, please call your pharmacy*   Lab Work:  TODAY!!!! BMET  If you have labs (blood work) drawn today and your tests are completely normal, you will receive your results only by: [place] (if you have MyChart) OR A paper copy in the mail If you have any lab test that is abnormal or we need to change your treatment, we will call you to review the results.   Testing/Procedures:  -NONE   Follow-Up: At [facility], you and your health needs are our priority.  As part of our continuing mission to provide you with exceptional heart care, we have created designated Provider Care Teams.  These Care Teams include your primary Cardiologist (physician) and Advanced Practice Providers (APPs -  Physician Assistants and Nurse Practitioners) who all work together to provide you with the care you need, when you need it.  We recommend signing up for the patient portal called "MyChart".  Sign up information is provided on this After Visit Summary.  MyChart is used to connect with patients for Virtual Visits (Telemedicine).  Patients are able to view lab/test results, encounter notes, upcoming appointments, etc.  Non-urgent messages can be sent to your provider as well.   To learn more about what you can do with MyChart, go to [email or web address].    Your next appointment:   6 month(s) with [name], PA-Con Wednesday, December 14 @ 2:15 pm.   The format for your next appointment:   In Person  Provider:   [name], PA-C   Other Instructions

## 2020-09-22 ENCOUNTER — Other Ambulatory Visit: Payer: Self-pay

## 2020-09-22 ENCOUNTER — Encounter: Payer: Self-pay | Admitting: Emergency Medicine

## 2020-09-22 ENCOUNTER — Ambulatory Visit (INDEPENDENT_AMBULATORY_CARE_PROVIDER_SITE_OTHER): Payer: Medicare Other | Admitting: Obstetrics and Gynecology

## 2020-09-22 ENCOUNTER — Ambulatory Visit (INDEPENDENT_AMBULATORY_CARE_PROVIDER_SITE_OTHER): Payer: Medicare Other | Admitting: Emergency Medicine

## 2020-09-22 ENCOUNTER — Encounter: Payer: Self-pay | Admitting: Obstetrics and Gynecology

## 2020-09-22 VITALS — BP 100/60 | HR 66 | Ht 63.0 in | Wt 125.0 lb

## 2020-09-22 DIAGNOSIS — I2510 Atherosclerotic heart disease of native coronary artery without angina pectoris: Secondary | ICD-10-CM

## 2020-09-22 DIAGNOSIS — D869 Sarcoidosis, unspecified: Secondary | ICD-10-CM | POA: Diagnosis not present

## 2020-09-22 DIAGNOSIS — J479 Bronchiectasis, uncomplicated: Secondary | ICD-10-CM

## 2020-09-22 DIAGNOSIS — J449 Chronic obstructive pulmonary disease, unspecified: Secondary | ICD-10-CM | POA: Diagnosis not present

## 2020-09-22 DIAGNOSIS — L0231 Cutaneous abscess of buttock: Secondary | ICD-10-CM | POA: Diagnosis not present

## 2020-09-22 DIAGNOSIS — J301 Allergic rhinitis due to pollen: Secondary | ICD-10-CM

## 2020-09-22 MED ORDER — DOXYCYCLINE HYCLATE 100 MG PO CAPS: 100.0000 mg | ORAL_CAPSULE | Freq: Two times a day (BID) | ORAL | 0 refills | Status: DC

## 2020-09-22 MED ORDER — AMOXICILLIN-POT CLAVULANATE 875-125 MG PO TABS
1.0000 | ORAL_TABLET | Freq: Two times a day (BID) | ORAL | 0 refills | Status: DC
Start: 2020-09-22 — End: 2020-11-17

## 2020-09-22 NOTE — Assessment & Plan Note (Signed)
Continue fluticasone nasal spray, cetirizine

## 2020-09-22 NOTE — Patient Instructions (Addendum)
Please continue your Symbicort 2 puffs twice a day.  Rinse and gargle after using. Keep your albuterol available to use 2 puffs when you need if shortness of breath, chest tightness, wheezing. Your chest x-ray at last visit was stable.  We will hold off on repeating your CT scan of the chest right now.  We can discuss the timing of this at your future visit. Please continue fluticasone nasal spray, 2 sprays each nostril once daily. Please continue cetirizine once daily. Follow with Dr [name] in 6 months or sooner if you have any problems

## 2020-09-22 NOTE — Assessment & Plan Note (Signed)
No clear evidence for interstitial disease or any new infiltrates on chest x-ray from 05/2020.  Clinically stable.  I believe we can defer CT chest right.  We can discuss timing of any repeat CT chest at her next visit depending on her clinical stability.

## 2020-09-22 NOTE — Progress Notes (Signed)
GYNECOLOGY  VISIT   HPI: 86 y.o.   Widowed White or Caucasian Not Hispanic or Latino  female   [id number] with Patient's last menstrual period was 02/22/1983 (approximate).   here for a knot that came up over night.  She noticed a tender lump on the left vulva, noticed it yesterday. Not draining.   GYNECOLOGIC HISTORY: Patient's last menstrual period was 02/22/1983 (approximate). Contraception:pmp  Menopausal hormone therapy: None        OB History     Gravida  1   Para  1   Term  1   Preterm  0   AB  0   Living  1      SAB  0   IAB  0   Ectopic  0   Multiple  0   Live Births  1              Patient Active Problem List   Diagnosis Date Noted   Age-related osteoporosis without current pathological fracture 05/01/2020   Hyperglycemia 05/01/2020   Impacted cerumen 05/01/2020   Nasal congestion 05/01/2020   Personal history of COVID-19 05/01/2020   Bilateral hearing loss 11/15/2019   Post-nasal drainage 11/15/2019   COPD (chronic obstructive pulmonary disease) ([place]) 10/30/2019   Edema 10/30/2019   Atrial fibrillation with RVR ([place])    Aspiration pneumonia ([place]) 09/19/2019   Pneumonia 09/18/2019   Sepsis ([place]) 07/30/2019   Pyelonephritis 07/29/2019   Hydronephrosis 07/29/2019   Adhesive capsulitis of right shoulder 07/29/2019   Heart disease 07/29/2019   Iron deficiency anemia [id number]   Metabolic encephalopathy [id number]   Sepsis due to Escherichia coli (e. coli) ([place]) 07/29/2019   Sepsis due to urinary tract infection ([place]) 07/18/2019   Leukocytosis 07/18/2019   Acute pain of left wrist 07/11/2019   S/P reverse total shoulder arthroplasty, 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   Pre-op evaluation 06/11/2019   Rotator cuff arthropathy of left shoulder 05/03/2019   Shoulder pain, left 04/29/2019   Low back pain 03/13/2019   Fracture of inferior pubic ramus ([place]) 02/28/2019   Degeneration of lumbar intervertebral disc 02/14/2019   Acute renal failure syndrome ([place])  01/04/2019   Sacral insufficiency fracture 12/12/2018   Hip fracture ([place]) 12/12/2018   Amnesia 02/06/2018   ETD (Eustachian tube dysfunction), bilateral 11/07/2017   Chronic nonintractable headache 11/07/2017   Acute maxillary sinusitis 10/17/2017   Otalgia 10/17/2017   Aftercare 06/06/2017   Closed fracture of right olecranon process 05/09/2017   Pain in joint of right elbow 05/02/2017   Rib lesion 04/20/2017   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   Bronchiectasis without complication ([place]) [id number]   Atopic dermatitis 04/08/2016   Skin sensation disturbance 03/08/2016   Weakness 12/22/2015   Allergic rhinitis 09/15/2015   CAD (coronary artery disease) 05/18/2015   Essential hypertension 05/18/2015   Carotid artery disease ([place]) 05/18/2015   Cough 03/19/2015   Left foot pain 09/02/2014   DOE (dyspnea on exertion) 12/26/2013   Abnormal gait 11/06/2013   Scoliosis (and kyphoscoliosis), idiopathic 07/30/2013   Acquired unequal leg length on left 04/11/2013   Disorder of lung 11/23/2012   History of fracture 10/18/2012   Palpitations 10/18/2012   Constipation 09/05/2012   History of sinus tachycardia 09/05/2012   Closed left subtrochanteric femur fracture S/P Open/closed and reduction, internal medullary fixation  09/05/2012   Acute posthemorrhagic anemia 08/22/2012   Spasm of back muscles 03/12/2012   Lumbar radiculopathy 11/23/2011   Leg weakness 11/23/2011   Mitral regurgitation 11/22/2011  Low compliance bladder 04/18/2011   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   Carpal tunnel syndrome 02/23/2009   Cardiovascular symptoms 02/23/2009   Vitamin D deficiency 02/23/2009   DIARRHEA-PRESUMED INFECTIOUS 08/18/2008   RECTAL BLEEDING 08/18/2008   PERSONAL HX COLONIC POLYPS 08/18/2008   DEGENERATIVE JOINT DISEASE 08/14/2008   SKIN CANCER, HX OF 08/14/2008   CARPAL TUNNEL SYNDROME, HX OF 08/14/2008   SARCOIDOSIS, PULMONARY 08/08/2008   HLD  (hyperlipidemia) 08/08/2008   Paroxysmal supraventricular tachycardia ([place]) 08/08/2008   GERD 08/08/2008    Past Medical History:  Diagnosis Date   Allergy    SEASONAL   Anemia    Arthritis    Asthma    "slight"    Baker's cyst of knee, right    CAD (coronary artery disease)    a. Myoview 10/15 - normal EF 70% // Myoview 11/16: EF 75%, normal perfusion, low risk // c. LHC 3/17 - LAD irregs, oLCx 70 (neg FFR), mRCA 30, EF 55-65% >> med Rx // Myoview 07/2018:  EF 73, extracardiac uptake, no significant ischemia (reviewed with Dr. Burt Knack), Low Risk // Myoview 6/22: EF 72, normal perfusion, low risk   Carotid stenosis    a. Carotid US 9/15 - Bilateral 1-39% ICA >> FU 2 years // b. Bilateral ICA 1-39 >> FU prn // Carotid US 06/2018: bilat 1-39; fu prn   Dyspnea    due to Sarcoidosis. When is windy or humed   Dysrhythmia    fast heart rate   Elbow fracture 04/2017   Right, had surgery   Esophageal reflux    Hematochezia    History of echocardiogram    a. Echo 10/15 - EF 60-65%, no RWMA   HLD (hyperlipidemia)    Hx of colonoscopy    Osteoporosis    Other chronic pulmonary heart diseases    Paroxysmal supraventricular tachycardia ([place])    Pneumonia    Sarcoidosis     Past Surgical History:  Procedure Laterality Date   arm surgery Right    BLADDER SURGERY     CARDIAC CATHETERIZATION N/A 05/20/2015   Procedure: Left Heart Cath and Coronary Angiography;  Surgeon: Sherren Mocha, MD;  Location: [facility];  Service: Cardiovascular;  Laterality: N/A;   CARPAL TUNNEL RELEASE Left    COLONOSCOPY     CYSTOSCOPY W/ RETROGRADES Left 07/30/2019   Procedure: CYSTOSCOPY WITH RETROGRADE PYELOGRAM LEFT STENT PLACEMENT;  Surgeon: Ardis Hughs, MD;  Location: [facility];  Service: Urology;  Laterality: Left;   CYSTOSCOPY WITH RETROGRADE PYELOGRAM, URETEROSCOPY AND STENT PLACEMENT Left 08/20/2019   Procedure: CYSTOSCOPY, URETEROSCOPY AND STENT PLACEMENT;  Surgeon: Robley Fries, MD;   Location: [facility];  Service: Urology;  Laterality: Left;  1 HR   FOOT SURGERY Right    HIP SURGERY Right 2011   full replacement   HOLMIUM LASER APPLICATION Left [id number]   Procedure: HOLMIUM LASER APPLICATION;  Surgeon: Robley Fries, MD;  Location: [facility];  Service: Urology;  Laterality: Left;   LEG SURGERY Left May 2014   femur fracture s/p open and closed reduction in [place], Dr. Jimmye Norman   ORIF ELBOW FRACTURE Right 05/09/2017   Procedure: ORIF right olecranon fracture with repair/reconstruction, ulnar nerve transposition as needed;  Surgeon: Roseanne Kaufman, MD;  Location: [facility];  Service: Orthopedics;  Laterality: Right;  Requests 90 mins   pelvis fracture     POLYPECTOMY     REVERSE SHOULDER ARTHROPLASTY Left 06/13/2019   Procedure: REVERSE SHOULDER ARTHROPLASTY;  Surgeon: Justice Britain,  MD;  Location: [facility];  Service: Orthopedics;  Laterality: Left;  162mn   SHOULDER ARTHROSCOPY W/ ROTATOR CUFF REPAIR Right    TRAPEZIUM RESECTION Right     Current Outpatient Medications  Medication Sig Dispense Refill   acetaminophen (TYLENOL) 500 MG tablet Take 1,000 mg by mouth every 6 (six) hours as needed for mild pain.      albuterol (VENTOLIN HFA) 108 (90 Base) MCG/ACT inhaler Inhale 2 puffs into the lungs every 4 (four) hours as needed for wheezing or shortness of breath. 6.7 g 1   apixaban (ELIQUIS) 2.5 MG TABS tablet Take 1 tablet (2.5 mg total) by mouth 2 (two) times daily. 180 tablet 1   calcium carbonate (TUMS - DOSED IN MG ELEMENTAL CALCIUM) 500 MG chewable tablet Chew 1 tablet by mouth daily as needed for indigestion.      cetirizine (ZYRTEC) 10 MG tablet Take 1 tablet (10 mg total) by mouth daily. 30 tablet 1   denosumab (PROLIA) 60 MG/ML SOLN injection Inject 60 mg into the skin every 6 (six) months. Administer in upper arm, thigh, or abdomen     dextromethorphan (DELSYM) 30 MG/5ML liquid Take 30 mg by mouth 2 (two) times daily as needed for cough.     diltiazem (CARDIZEM CD)  180 MG 24 hr capsule Take 180 mg by mouth at bedtime.      docusate sodium (COLACE) 50 MG capsule Take 100 mg by mouth at bedtime.      fluticasone (FLONASE) 50 MCG/ACT nasal spray Place 2 sprays into both nostrils 2 (two) times daily. 16 g 1   furosemide (LASIX) 20 MG tablet Take 1 tablet (20 mg total) by mouth daily. 90 tablet 3   melatonin 5 MG TABS Take 10 mg by mouth at bedtime.     nitroGLYCERIN (NITROSTAT) 0.4 MG SL tablet Place 1 tablet (0.4 mg total) under the tongue every 5 (five) minutes as needed for chest pain. 25 tablet 3   omeprazole (PRILOSEC) 40 MG capsule Take 40 mg by mouth 2 (two) times daily.      ondansetron (ZOFRAN-ODT) 4 MG disintegrating tablet Take 4 mg by mouth every 4 (four) hours as needed for nausea or vomiting.      oxybutynin (DITROPAN) 5 MG tablet Take 5 mg by mouth at bedtime.     oxyCODONE-acetaminophen (PERCOCET) 5-325 MG tablet Take 1 tablet by mouth every 4 (four) hours as needed (max 6 q for post op pain). 30 tablet 0   polyethylene glycol (MIRALAX / GLYCOLAX) 17 g packet Take 17 g by mouth daily.     rosuvastatin (CRESTOR) 10 MG tablet Take 1 tablet (10 mg total) by mouth 3 (three) times a week. Monday, Wednesday and Friday. 45 tablet 2   Vitamin D, Ergocalciferol, (DRISDOL) 50000 units CAPS capsule Take 50,000 Units by mouth every Friday.      ipratropium (ATROVENT HFA) 17 MCG/ACT inhaler Inhale 1 puff into the lungs 2 (two) times daily. 1 Inhaler 2   No current facility-administered medications for this visit.     ALLERGIES: Levaquin [levofloxacin], Sulfonamide derivatives, Oxycodone, and Morphine  Family History  Problem Relation Age of Onset   Colon cancer Mother 652  Cancer Mother    Lung cancer Father    Heart disease Father    Arrhythmia Father    Cancer Father    Cancer Sister        ovarian   Heart attack Brother    Esophageal cancer Neg Hx  Rectal cancer Neg Hx    Stomach cancer Neg Hx    Stroke Neg Hx     Social History    Socioeconomic History   Marital status: Widowed    Spouse name: Not on file   Number of children: 1   Years of education: Not on file   Highest education level: Some college, no degree  Occupational History   Occupation: retired    Comment: still works as Scientist, 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  Tobacco Use   Smoking status: Never   Smokeless tobacco: Never  Vaping Use   Vaping Use: Never used  Substance and Sexual Activity   Alcohol use: No   Drug use: No   Sexual activity: Not Currently    Partners: Male    Birth control/protection: Post-menopausal  Other Topics Concern   Not on file  Social History Narrative   Lives with daughter and son in law   decaffeinated  use:  Coffee 1 per day   Right handed   Social Determinants of Health   Financial Resource Strain: Not on file  Food Insecurity: Not on file  Transportation Needs: Not on file  Physical Activity: Not on file  Stress: Not on file  Social Connections: Not on file  Intimate Partner Violence: Not on file    Review of Systems  All other systems reviewed and are negative.  PHYSICAL EXAMINATION:    BP 100/60   Pulse 66   Ht '5\' 3"'$  (1.6 m)   Wt 125 lb (56.7 kg)   LMP 02/22/1983 (Approximate)   SpO2 100%   BMI 22.14 kg/m     General appearance: alert, cooperative and appears stated age Pelvic: External genitalia:  no lesions              Urethra:  normal appearing urethra with no masses, tenderness or lesions              Bartholins and Skenes: normal                 Buttock: on the upper inner buttock is a 1.5 cm skin abscess, not coming to a head.  Chaperone was present for exam.  1. Cutaneous abscess of buttock Use warm compresses and hot soaks - amoxicillin-clavulanate (AUGMENTIN) 875-125 MG tablet; Take 1 tablet by mouth 2 (two) times daily. Take one tablet BID x 7D  Dispense: 14 tablet; Refill: 0 - doxycycline (VIBRAMYCIN) 100 MG capsule; Take 1 capsule (100 mg total) by mouth 2 (two) times  daily. Take BID for 7 days.  Take with food as can cause GI distress.  Dispense: 14 capsule; Refill: 0 -Call if not improving

## 2020-09-22 NOTE — Patient Instructions (Signed)
Skin Abscess  A skin abscess is an infected area on or under your skin that contains a collection of pus and other material. An abscess may also be called a furuncle,carbuncle, or boil. An abscess can occur in or on almost any part of your body. Some abscesses break open (rupture) on their own. Most continue to get worse unless they are treated. The infection can spread deeper into the body and eventually into your blood, whichcan make you feel ill. Treatment usually involves draining the abscess. What are the causes? An abscess occurs when germs, like bacteria, pass through your skin and cause an infection. This may be caused by: A scrape or cut on your skin. A puncture wound through your skin, including a needle injection or insect bite. Blocked oil or sweat glands. Blocked and infected hair follicles. A cyst that forms beneath your skin (sebaceous cyst) and becomes infected. What increases the risk? This condition is more likely to develop in people who: Have a weak body defense system (immune system). Have diabetes. Have dry and irritated skin. Get frequent injections or use illegal IV drugs. Have a foreign body in a wound, such as a splinter. Have problems with their lymph system or veins. What are the signs or symptoms? Symptoms of this condition include: A painful, firm bump under the skin. A bump with pus at the top. This may break through the skin and drain. Other symptoms include: Redness surrounding the abscess site. Warmth. Swelling of the lymph nodes (glands) near the abscess. Tenderness. A sore on the skin. How is this diagnosed? This condition may be diagnosed based on: A physical exam. Your medical history. A sample of pus. This may be used to find out what is causing the infection. Blood tests. Imaging tests, such as an ultrasound, CT scan, or MRI. How is this treated? A small abscess that drains on its own may not need treatment. Treatment for larger abscesses  may include: Moist heat or heat pack applied to the area several times a day. A procedure to drain the abscess (incision and drainage). Antibiotic medicines. For a severe abscess, you may first get antibiotics through an IV and then change to antibiotics by mouth. Follow these instructions at home: Medicines  Take over-the-counter and prescription medicines only as told by your health care provider. If you were prescribed an antibiotic medicine, take it as told by your health care provider. Do not stop taking the antibiotic even if you start to feel better.  Abscess care  If you have an abscess that has not drained, apply heat to the affected area. Use the heat source that your health care provider recommends, such as a moist heat pack or a heating pad. Place a towel between your skin and the heat source. Leave the heat on for 20-30 minutes. Remove the heat if your skin turns bright red. This is especially important if you are unable to feel pain, heat, or cold. You may have a greater risk of getting burned. Follow instructions from your health care provider about how to take care of your abscess. Make sure you: Cover the abscess with a bandage (dressing). Change your dressing or gauze as told by your health care provider. Wash your hands with soap and water before you change the dressing or gauze. If soap and water are not available, use hand sanitizer. Check your abscess every day for signs of a worsening infection. Check for: More redness, swelling, or pain. More fluid or blood. Warmth. More   pus or a bad smell.  General instructions To avoid spreading the infection: Do not share personal care items, towels, or hot tubs with others. Avoid making skin contact with other people. Keep all follow-up visits as told by your health care provider. This is important. Contact a health care provider if you have: More redness, swelling, or pain around your abscess. More fluid or blood coming  from your abscess. Warm skin around your abscess. More pus or a bad smell coming from your abscess. A fever. Muscle aches. Chills or a general ill feeling. Get help right away if you: Have severe pain. See red streaks on your skin spreading away from the abscess. Summary A skin abscess is an infected area on or under your skin that contains a collection of pus and other material. A small abscess that drains on its own may not need treatment. Treatment for larger abscesses may include having a procedure to drain the abscess and taking an antibiotic. This information is not intended to replace advice given to you by your health care provider. Make sure you discuss any questions you have with your healthcare provider. Document Revised: 05/31/2018 Document Reviewed: 03/23/2017 Elsevier Patient Education  2022 Elsevier Inc.  

## 2020-09-22 NOTE — Progress Notes (Signed)
   Subjective:    Patient ID: TYNNETTA PILECKI, female    DOB: 02/14/1935, 86 y.o.   MRN: [id number]  HPI  ROV 06/16/20 --86 year old woman with sarcoidosis and associated severe obstructive lung disease.  Also with a history of chronic rhinitis, GERD, atrial fibrillation/SVT.  She has bronchiectasis/bronchial wall thickening on imaging. She has been managed on Symbicort (replaced Dulera) but states that she ran out of it 2-3 months ago. She has noticed that her breathing has been worse, especially for the last 3-4 weeks. Decreased functional capacity. She has been hearing wheeze for about a week. No cough currently.   At her last visit we stopped scheduled Atrovent inhaler.  She has albuterol, uses Remains on Mucinex, uses her flutter valve daily.  She restarted her cetirizine about a week ago, but she stopped her fluticasone NS.   ROV 09/22/20 --Ms. Rottler is 75 with severe obstructive lung disease and bronchiectasis on imaging associated with sarcoidosis.  She also has chronic rhinitis, GERD, A. fib. Currently managed on Symbicort which we restarted in April.  She reports that her breathing is doing well, probably better than last visit. She has trouble tolerating the hot weather. No cough. She has not been needing her albuterol.  She is on cetirizine, fluticasone nasal spray. Her congestion is not active right now.   Chest x-ray 06/16/2020 did not show any significant infiltrates  Review of Systems As per HPI     Objective:   Physical Exam Vitals:   09/22/20 1629  BP: 118/68  Pulse: 64  Temp: 98.5 F (36.9 C)  TempSrc: Oral  SpO2: 97%  Weight: 125 lb 6.4 oz (56.9 kg)  Height: '5\' 3"'$  (1.6 m)   Gen: Pleasant, kyphotic elderly woman, in no distress,  normal affect  ENT: No lesions,  mouth clear,  oropharynx clear, no postnasal drip  Neck: No JVD, no stridor  Lungs: No use of accessory muscles, clear without wheeze or crackles.   Cardiovascular: RRR, heart sounds normal, no murmur  or gallops, trace ankle peripheral edema  Musculoskeletal: No deformities, no cyanosis or clubbing  Neuro: alert, non focal. Poor memory regarding her med regimen  Skin: Warm, no lesions or rashes       Assessment & Plan:  COPD (chronic obstructive pulmonary disease) ([place]) Please continue your Symbicort 2 puffs twice a day.  Rinse and gargle after using. Keep your albuterol available to use 2 puffs when you need if shortness of breath, chest tightness, wheezing. Follow with Dr [name] in 6 months or sooner if you have any problems  Bronchiectasis without complication (HCC) Cough or sputum production.  Continue control her rhinitis.  No need for flutter or mucus clearance assistance.  Can discuss the timing of any repeat CT depending on symptoms going forward.  Allergic rhinitis Continue fluticasone nasal spray, cetirizine  SARCOIDOSIS, PULMONARY No clear evidence for interstitial disease or any new infiltrates on chest x-ray from 05/2020.  Clinically stable.  I believe we can defer CT chest right.  We can discuss timing of any repeat CT chest at her next visit depending on her clinical stability.  Baltazar Apo, MD, PhD 09/22/2020, 5:09 PM [facility] [phone] or if no answer [phone]

## 2020-09-22 NOTE — Assessment & Plan Note (Addendum)
Please continue your Symbicort 2 puffs twice a day.  Rinse and gargle after using. Keep your albuterol available to use 2 puffs when you need if shortness of breath, chest tightness, wheezing. Follow with Dr [name] in 6 months or sooner if you have any problems

## 2020-09-22 NOTE — Assessment & Plan Note (Signed)
Cough or sputum production.  Continue control her rhinitis.  No need for flutter or mucus clearance assistance.  Can discuss the timing of any repeat CT depending on symptoms going forward.

## 2020-09-22 NOTE — Progress Notes (Addendum)
Error, see other note.

## 2020-10-17 ENCOUNTER — Other Ambulatory Visit: Payer: Self-pay | Admitting: Physician Assistant

## 2020-11-04 ENCOUNTER — Encounter: Payer: Self-pay | Admitting: Obstetrics and Gynecology

## 2020-11-04 ENCOUNTER — Other Ambulatory Visit: Payer: Self-pay

## 2020-11-04 ENCOUNTER — Ambulatory Visit (INDEPENDENT_AMBULATORY_CARE_PROVIDER_SITE_OTHER): Payer: Medicare Other | Admitting: Obstetrics and Gynecology

## 2020-11-04 VITALS — BP 122/62 | HR 88 | Ht 62.0 in | Wt 125.0 lb

## 2020-11-04 DIAGNOSIS — L0231 Cutaneous abscess of buttock: Secondary | ICD-10-CM

## 2020-11-04 DIAGNOSIS — I2510 Atherosclerotic heart disease of native coronary artery without angina pectoris: Secondary | ICD-10-CM | POA: Diagnosis not present

## 2020-11-04 NOTE — Patient Instructions (Signed)
Skin Abscess  A skin abscess is an infected area on or under your skin that contains a collection of pus and other material. An abscess may also be called a furuncle,carbuncle, or boil. An abscess can occur in or on almost any part of your body. Some abscesses break open (rupture) on their own. Most continue to get worse unless they are treated. The infection can spread deeper into the body and eventually into your blood, whichcan make you feel ill. Treatment usually involves draining the abscess. What are the causes? An abscess occurs when germs, like bacteria, pass through your skin and cause an infection. This may be caused by: A scrape or cut on your skin. A puncture wound through your skin, including a needle injection or insect bite. Blocked oil or sweat glands. Blocked and infected hair follicles. A cyst that forms beneath your skin (sebaceous cyst) and becomes infected. What increases the risk? This condition is more likely to develop in people who: Have a weak body defense system (immune system). Have diabetes. Have dry and irritated skin. Get frequent injections or use illegal IV drugs. Have a foreign body in a wound, such as a splinter. Have problems with their lymph system or veins. What are the signs or symptoms? Symptoms of this condition include: A painful, firm bump under the skin. A bump with pus at the top. This may break through the skin and drain. Other symptoms include: Redness surrounding the abscess site. Warmth. Swelling of the lymph nodes (glands) near the abscess. Tenderness. A sore on the skin. How is this diagnosed? This condition may be diagnosed based on: A physical exam. Your medical history. A sample of pus. This may be used to find out what is causing the infection. Blood tests. Imaging tests, such as an ultrasound, CT scan, or MRI. How is this treated? A small abscess that drains on its own may not need treatment. Treatment for larger abscesses  may include: Moist heat or heat pack applied to the area several times a day. A procedure to drain the abscess (incision and drainage). Antibiotic medicines. For a severe abscess, you may first get antibiotics through an IV and then change to antibiotics by mouth. Follow these instructions at home: Medicines  Take over-the-counter and prescription medicines only as told by your health care provider. If you were prescribed an antibiotic medicine, take it as told by your health care provider. Do not stop taking the antibiotic even if you start to feel better.  Abscess care  If you have an abscess that has not drained, apply heat to the affected area. Use the heat source that your health care provider recommends, such as a moist heat pack or a heating pad. Place a towel between your skin and the heat source. Leave the heat on for 20-30 minutes. Remove the heat if your skin turns bright red. This is especially important if you are unable to feel pain, heat, or cold. You may have a greater risk of getting burned. Follow instructions from your health care provider about how to take care of your abscess. Make sure you: Cover the abscess with a bandage (dressing). Change your dressing or gauze as told by your health care provider. Wash your hands with soap and water before you change the dressing or gauze. If soap and water are not available, use hand sanitizer. Check your abscess every day for signs of a worsening infection. Check for: More redness, swelling, or pain. More fluid or blood. Warmth. More   pus or a bad smell.  General instructions To avoid spreading the infection: Do not share personal care items, towels, or hot tubs with others. Avoid making skin contact with other people. Keep all follow-up visits as told by your health care provider. This is important. Contact a health care provider if you have: More redness, swelling, or pain around your abscess. More fluid or blood coming  from your abscess. Warm skin around your abscess. More pus or a bad smell coming from your abscess. A fever. Muscle aches. Chills or a general ill feeling. Get help right away if you: Have severe pain. See red streaks on your skin spreading away from the abscess. Summary A skin abscess is an infected area on or under your skin that contains a collection of pus and other material. A small abscess that drains on its own may not need treatment. Treatment for larger abscesses may include having a procedure to drain the abscess and taking an antibiotic. This information is not intended to replace advice given to you by your health care provider. Make sure you discuss any questions you have with your healthcare provider. Document Revised: 05/31/2018 Document Reviewed: 03/23/2017 Elsevier Patient Education  2022 Elsevier Inc.  

## 2020-11-04 NOTE — Progress Notes (Signed)
GYNECOLOGY  VISIT   HPI: 86 y.o.   Widowed White or Caucasian Not Hispanic or Latino  female   [id number] with Patient's last menstrual period was 02/22/1983 (approximate).   here for an abscess. She states that it is sore and bled yesterday.  She was seen on 09/22/20 with a 1.5 cm skin abscess on her left, inner buttock. It was treated with conservative methods and antibiotics.  The pain went away with treatment, but the bump persisted.  She states the bump started getting sore this week, no bigger, some bloody d/c.   GYNECOLOGIC HISTORY: Patient's last menstrual period was 02/22/1983 (approximate). Contraception:PMP Menopausal hormone therapy: none         OB History     Gravida  1   Para  1   Term  1   Preterm  0   AB  0   Living  1      SAB  0   IAB  0   Ectopic  0   Multiple  0   Live Births  1              Patient Active Problem List   Diagnosis Date Noted   Age-related osteoporosis without current pathological fracture 05/01/2020   Hyperglycemia 05/01/2020   Impacted cerumen 05/01/2020   Nasal congestion 05/01/2020   Personal history of COVID-19 05/01/2020   Bilateral hearing loss 11/15/2019   Post-nasal drainage 11/15/2019   COPD (chronic obstructive pulmonary disease) ([place]) 10/30/2019   Edema 10/30/2019   Atrial fibrillation with RVR ([place])    Aspiration pneumonia ([place]) 09/19/2019   Pneumonia 09/18/2019   Sepsis ([place]) 07/30/2019   Pyelonephritis 07/29/2019   Hydronephrosis 07/29/2019   Adhesive capsulitis of right shoulder 07/29/2019   Heart disease 07/29/2019   Iron deficiency anemia [id number]   Metabolic encephalopathy [id number]   Sepsis due to Escherichia coli (e. coli) ([place]) 07/29/2019   Sepsis due to urinary tract infection ([place]) 07/18/2019   Leukocytosis 07/18/2019   Acute pain of left wrist 07/11/2019   S/P reverse total shoulder arthroplasty, 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   Pre-op evaluation 06/11/2019   Rotator cuff arthropathy of left shoulder  05/03/2019   Shoulder pain, left 04/29/2019   Low back pain 03/13/2019   Fracture of inferior pubic ramus ([place]) 02/28/2019   Degeneration of lumbar intervertebral disc 02/14/2019   Acute renal failure syndrome ([place]) 01/04/2019   Sacral insufficiency fracture 12/12/2018   Hip fracture ([place]) 12/12/2018   Amnesia 02/06/2018   ETD (Eustachian tube dysfunction), bilateral 11/07/2017   Chronic nonintractable headache 11/07/2017   Acute maxillary sinusitis 10/17/2017   Otalgia 10/17/2017   Aftercare 06/06/2017   Closed fracture of right olecranon process 05/09/2017   Pain in joint of right elbow 05/02/2017   Rib lesion 04/20/2017   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   Bronchiectasis without complication ([place]) [id number]   Atopic dermatitis 04/08/2016   Skin sensation disturbance 03/08/2016   Weakness 12/22/2015   Allergic rhinitis 09/15/2015   CAD (coronary artery disease) 05/18/2015   Essential hypertension 05/18/2015   Carotid artery disease ([place]) 05/18/2015   Cough 03/19/2015   Left foot pain 09/02/2014   DOE (dyspnea on exertion) 12/26/2013   Abnormal gait 11/06/2013   Scoliosis (and kyphoscoliosis), idiopathic 07/30/2013   Acquired unequal leg length on left 04/11/2013   Disorder of lung 11/23/2012   History of fracture 10/18/2012   Palpitations 10/18/2012   Constipation 09/05/2012   History of sinus tachycardia 09/05/2012   Closed left subtrochanteric  femur fracture S/P Open/closed and reduction, internal medullary fixation  09/05/2012   Acute posthemorrhagic anemia 08/22/2012   Spasm of back muscles 03/12/2012   Lumbar radiculopathy 11/23/2011   Leg weakness 11/23/2011   Mitral regurgitation 11/22/2011   Low compliance bladder 04/18/2011   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   Carpal tunnel syndrome 02/23/2009   Cardiovascular symptoms 02/23/2009   Vitamin D deficiency 02/23/2009   DIARRHEA-PRESUMED INFECTIOUS 08/18/2008   RECTAL BLEEDING  08/18/2008   PERSONAL HX COLONIC POLYPS 08/18/2008   DEGENERATIVE JOINT DISEASE 08/14/2008   SKIN CANCER, HX OF 08/14/2008   CARPAL TUNNEL SYNDROME, HX OF 08/14/2008   SARCOIDOSIS, PULMONARY 08/08/2008   HLD (hyperlipidemia) 08/08/2008   Paroxysmal supraventricular tachycardia ([place]) 08/08/2008   GERD 08/08/2008    Past Medical History:  Diagnosis Date   Allergy    SEASONAL   Anemia    Arthritis    Asthma    "slight"    Baker's cyst of knee, right    CAD (coronary artery disease)    a. Myoview 10/15 - normal EF 70% // Myoview 11/16: EF 75%, normal perfusion, low risk // c. LHC 3/17 - LAD irregs, oLCx 70 (neg FFR), mRCA 30, EF 55-65% >> med Rx // Myoview 07/2018:  EF 73, extracardiac uptake, no significant ischemia (reviewed with Dr. Burt Knack), Low Risk // Myoview 6/22: EF 72, normal perfusion, low risk   Carotid stenosis    a. Carotid US 9/15 - Bilateral 1-39% ICA >> FU 2 years // b. Bilateral ICA 1-39 >> FU prn // Carotid US 06/2018: bilat 1-39; fu prn   Dyspnea    due to Sarcoidosis. When is windy or humed   Dysrhythmia    fast heart rate   Elbow fracture 04/2017   Right, had surgery   Esophageal reflux    Hematochezia    History of echocardiogram    a. Echo 10/15 - EF 60-65%, no RWMA   HLD (hyperlipidemia)    Hx of colonoscopy    Osteoporosis    Other chronic pulmonary heart diseases    Paroxysmal supraventricular tachycardia ([place])    Pneumonia    Sarcoidosis     Past Surgical History:  Procedure Laterality Date   arm surgery Right    BLADDER SURGERY     CARDIAC CATHETERIZATION N/A 05/20/2015   Procedure: Left Heart Cath and Coronary Angiography;  Surgeon: Sherren Mocha, MD;  Location: [facility];  Service: Cardiovascular;  Laterality: N/A;   CARPAL TUNNEL RELEASE Left    COLONOSCOPY     CYSTOSCOPY W/ RETROGRADES Left 07/30/2019   Procedure: CYSTOSCOPY WITH RETROGRADE PYELOGRAM LEFT STENT PLACEMENT;  Surgeon: Ardis Hughs, MD;  Location: [facility];   Service: Urology;  Laterality: Left;   CYSTOSCOPY WITH RETROGRADE PYELOGRAM, URETEROSCOPY AND STENT PLACEMENT Left 08/20/2019   Procedure: CYSTOSCOPY, URETEROSCOPY AND STENT PLACEMENT;  Surgeon: Robley Fries, MD;  Location: [facility];  Service: Urology;  Laterality: Left;  1 HR   FOOT SURGERY Right    HIP SURGERY Right 2011   full replacement   HOLMIUM LASER APPLICATION Left [id number]   Procedure: HOLMIUM LASER APPLICATION;  Surgeon: Robley Fries, MD;  Location: [facility];  Service: Urology;  Laterality: Left;   LEG SURGERY Left May 2014   femur fracture s/p open and closed reduction in [place], Dr. Jimmye Norman   ORIF ELBOW FRACTURE Right 05/09/2017   Procedure: ORIF right olecranon fracture with repair/reconstruction, ulnar nerve transposition as needed;  Surgeon: Roseanne Kaufman, MD;  Location: [facility];  Service: Orthopedics;  Laterality: Right;  Requests 90 mins   pelvis fracture     POLYPECTOMY     REVERSE SHOULDER ARTHROPLASTY Left 06/13/2019   Procedure: REVERSE SHOULDER ARTHROPLASTY;  Surgeon: Justice Britain, MD;  Location: [facility];  Service: Orthopedics;  Laterality: Left;  15mn   SHOULDER ARTHROSCOPY W/ ROTATOR CUFF REPAIR Right    TRAPEZIUM RESECTION Right     Current Outpatient Medications  Medication Sig Dispense Refill   acetaminophen (TYLENOL) 500 MG tablet Take 1,000 mg by mouth every 6 (six) hours as needed for mild pain.      albuterol (VENTOLIN HFA) 108 (90 Base) MCG/ACT inhaler Inhale 2 puffs into the lungs every 4 (four) hours as needed for wheezing or shortness of breath. 6.7 g 1   amoxicillin-clavulanate (AUGMENTIN) 875-125 MG tablet Take 1 tablet by mouth 2 (two) times daily. Take one tablet BID x 7D 14 tablet 0   apixaban (ELIQUIS) 2.5 MG TABS tablet Take 1 tablet (2.5 mg total) by mouth 2 (two) times daily. 180 tablet 1   calcium carbonate (TUMS - DOSED IN MG ELEMENTAL CALCIUM) 500 MG chewable tablet Chew 1 tablet by mouth daily as needed for indigestion.       cetirizine (ZYRTEC) 10 MG tablet Take 1 tablet (10 mg total) by mouth daily. 30 tablet 1   denosumab (PROLIA) 60 MG/ML SOLN injection Inject 60 mg into the skin every 6 (six) months. Administer in upper arm, thigh, or abdomen     dextromethorphan (DELSYM) 30 MG/5ML liquid Take 30 mg by mouth 2 (two) times daily as needed for cough.     diltiazem (CARDIZEM CD) 180 MG 24 hr capsule Take 180 mg by mouth at bedtime.      docusate sodium (COLACE) 50 MG capsule Take 100 mg by mouth at bedtime.      doxycycline (VIBRAMYCIN) 100 MG capsule Take 1 capsule (100 mg total) by mouth 2 (two) times daily. Take BID for 7 days.  Take with food as can cause GI distress. 14 capsule 0   fluticasone (FLONASE) 50 MCG/ACT nasal spray Place 2 sprays into both nostrils 2 (two) times daily. 16 g 1   furosemide (LASIX) 20 MG tablet Take 1 tablet (20 mg total) by mouth daily. 90 tablet 3   ipratropium (ATROVENT HFA) 17 MCG/ACT inhaler Inhale 1 puff into the lungs 2 (two) times daily. 1 Inhaler 2   melatonin 5 MG TABS Take 10 mg by mouth at bedtime.     nitroGLYCERIN (NITROSTAT) 0.4 MG SL tablet Place 1 tablet (0.4 mg total) under the tongue every 5 (five) minutes as needed for chest pain. 25 tablet 3   omeprazole (PRILOSEC) 40 MG capsule Take 40 mg by mouth 2 (two) times daily.      ondansetron (ZOFRAN-ODT) 4 MG disintegrating tablet Take 4 mg by mouth every 4 (four) hours as needed for nausea or vomiting.      oxybutynin (DITROPAN) 5 MG tablet Take 5 mg by mouth at bedtime.     oxyCODONE-acetaminophen (PERCOCET) 5-325 MG tablet Take 1 tablet by mouth every 4 (four) hours as needed (max 6 q for post op pain). 30 tablet 0   polyethylene glycol (MIRALAX / GLYCOLAX) 17 g packet Take 17 g by mouth daily.     rosuvastatin (CRESTOR) 10 MG tablet Take 1 tablet (10 mg total) by mouth 3 (three) times a week. Monday, Wednesday and Friday. 45 tablet 2   Vitamin D, Ergocalciferol, (DRISDOL)  50000 units CAPS capsule Take 50,000 Units by  mouth every Friday.      No current facility-administered medications for this visit.     ALLERGIES: Levaquin [levofloxacin], Sulfonamide derivatives, Oxycodone, and Morphine  Family History  Problem Relation Age of Onset   Colon cancer Mother 53   Cancer Mother    Lung cancer Father    Heart disease Father    Arrhythmia Father    Cancer Father    Cancer Sister        ovarian   Heart attack Brother    Esophageal cancer Neg Hx    Rectal cancer Neg Hx    Stomach cancer Neg Hx    Stroke Neg Hx     Social History   Socioeconomic History   Marital status: Widowed    Spouse name: Not on file   Number of children: 1   Years of education: Not on file   Highest education level: Some college, no degree  Occupational History   Occupation: retired    Comment: still works as Scientist, 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  Tobacco Use   Smoking status: Never   Smokeless tobacco: Never  Vaping Use   Vaping Use: Never used  Substance and Sexual Activity   Alcohol use: No   Drug use: No   Sexual activity: Not Currently    Partners: Male    Birth control/protection: Post-menopausal  Other Topics Concern   Not on file  Social History Narrative   Lives with daughter and son in law   decaffeinated  use:  Coffee 1 per day   Right handed   Social Determinants of Health   Financial Resource Strain: Not on file  Food Insecurity: Not on file  Transportation Needs: Not on file  Physical Activity: Not on file  Stress: Not on file  Social Connections: Not on file  Intimate Partner Violence: Not on file    Review of Systems  All other systems reviewed and are negative.  PHYSICAL EXAMINATION:    BP 122/62   Pulse 88   Ht '5\' 2"'$  (1.575 m)   Wt 125 lb (56.7 kg)   LMP 02/22/1983 (Approximate)   SpO2 93%   BMI 22.86 kg/m     General appearance: alert, cooperative and appears stated age  Pelvic: External genitalia:  no lesions              Urethra:  normal appearing urethra with  no masses, tenderness or lesions              Small, <1 cm abscess on the upper, inner left buttock, no surrounding erythema. When pressing on it a small amount of pus expelled. Culture sent  Chaperone was present for exam.  1. Cutaneous abscess of buttock Small, no surrounding erythema, spontaneously draining - Wound culture sent -Treat with warm compresses and hot soaks -Will not treat with antibiotics at this point -Return if not improving

## 2020-11-08 LAB — WOUND CULTURE
MICRO NUMBER:: 12372941
SPECIMEN QUALITY:: ADEQUATE

## 2020-11-09 ENCOUNTER — Telehealth: Payer: Self-pay | Admitting: *Deleted

## 2020-11-09 ENCOUNTER — Other Ambulatory Visit: Payer: Self-pay | Admitting: *Deleted

## 2020-11-09 MED ORDER — CIPROFLOXACIN HCL 500 MG PO TABS: 500.0000 mg | ORAL_TABLET | Freq: Two times a day (BID) | ORAL | 0 refills | Status: DC

## 2020-11-09 MED ORDER — SULFAMETHOXAZOLE-TRIMETHOPRIM 800-160 MG PO TABS: 1.0000 | ORAL_TABLET | Freq: Two times a day (BID) | ORAL | 0 refills | Status: DC

## 2020-11-09 NOTE — Telephone Encounter (Signed)
[facility] called stating patient has allergy to Sulfa medications.patient was prescribed Bactrium DS BID x 5 days for skin abscess. I called patient daughter ( has DPR access) and [name] ( daughter) said her mother had hepatitis many years ago and her doctor told patient it came from sulfa. That provider told her to avoid this medication. The daughter or patient is not sure of what type of hepatitis. Please advise

## 2020-11-09 NOTE — Telephone Encounter (Signed)
The only other oral medication that the bacteria is sensitive to is ciprofloxacin. She previously had a leg pain with levofloxacin. Let her know that levofloxacin and ciprofloxacin are in the same class of antibiotics, so she could potentially react to the cipro.  Please see if the patient has tolerated cipro before and when her reaction to levofloxacin occurred.  You could also run it by the pharmacist.  If we can't find an oral antibiotic she can take and the area isn't improving she may need to go to the ER for possible IV antibiotics.  If she is able to take cipro, I would treat her with 500 mg po BID x 5 days.

## 2020-11-09 NOTE — Telephone Encounter (Signed)
Left message for patient to call.

## 2020-11-09 NOTE — Telephone Encounter (Signed)
Left detailed message on number listed in chart which is [name] ( daughter) number, I asked her to call me to discuss the below.

## 2020-11-09 NOTE — Telephone Encounter (Signed)
Patient daughter [name] informed with below note. She reports she will try her mother on Cipro  500 mg tablet and I called [facility] asking them to D/C the Bactrim and that I spoke with sharon about the  levofloxacin and ciprofloxacin are in the same class of antibiotics. [name] still wanted to proceed with this. Rx sent.

## 2020-11-17 ENCOUNTER — Encounter: Payer: Self-pay | Admitting: Obstetrics and Gynecology

## 2020-11-17 ENCOUNTER — Ambulatory Visit (INDEPENDENT_AMBULATORY_CARE_PROVIDER_SITE_OTHER): Payer: Medicare Other | Admitting: Obstetrics and Gynecology

## 2020-11-17 ENCOUNTER — Other Ambulatory Visit: Payer: Self-pay

## 2020-11-17 VITALS — BP 110/60 | HR 70 | Resp 22 | Ht 62.0 in | Wt 123.4 lb

## 2020-11-17 DIAGNOSIS — I2510 Atherosclerotic heart disease of native coronary artery without angina pectoris: Secondary | ICD-10-CM | POA: Diagnosis not present

## 2020-11-17 DIAGNOSIS — L0231 Cutaneous abscess of buttock: Secondary | ICD-10-CM

## 2020-11-17 NOTE — Progress Notes (Signed)
GYNECOLOGY  VISIT   HPI: 86 y.o.   Widowed White or Caucasian Not Hispanic or Latino  female   [id number] with Patient's last menstrual period was 02/22/1983 (approximate).   here for f/u. She was seen ~2 weeks ago with a small abscess of her left buttock, it was draining at the time of her visit. A culture  grew out proteus mirabilis.  Treated with cipro. The cipro helped some, but it's still there and still sore.   GYNECOLOGIC HISTORY: Patient's last menstrual period was 02/22/1983 (approximate). Contraception:PMP Menopausal hormone therapy: none        OB History     Gravida  1   Para  1   Term  1   Preterm  0   AB  0   Living  1      SAB  0   IAB  0   Ectopic  0   Multiple  0   Live Births  1              Patient Active Problem List   Diagnosis Date Noted   Age-related osteoporosis without current pathological fracture 05/01/2020   Hyperglycemia 05/01/2020   Impacted cerumen 05/01/2020   Nasal congestion 05/01/2020   Personal history of COVID-19 05/01/2020   Bilateral hearing loss 11/15/2019   Post-nasal drainage 11/15/2019   COPD (chronic obstructive pulmonary disease) ([place]) 10/30/2019   Edema 10/30/2019   Atrial fibrillation with RVR ([place])    Aspiration pneumonia ([place]) 09/19/2019   Pneumonia 09/18/2019   Sepsis ([place]) 07/30/2019   Pyelonephritis 07/29/2019   Hydronephrosis 07/29/2019   Adhesive capsulitis of right shoulder 07/29/2019   Heart disease 07/29/2019   Iron deficiency anemia 38/18/2993   Metabolic encephalopathy 71/69/6789   Sepsis due to Escherichia coli (e. coli) ([place]) 07/29/2019   Sepsis due to urinary tract infection ([place]) 07/18/2019   Leukocytosis 07/18/2019   Acute pain of left wrist 07/11/2019   S/P reverse total shoulder arthroplasty, 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   Pre-op evaluation 06/11/2019   Rotator cuff arthropathy of left shoulder 05/03/2019   Shoulder pain, left 04/29/2019   Low back pain 03/13/2019   Fracture of inferior pubic  ramus ([place]) 02/28/2019   Degeneration of lumbar intervertebral disc 02/14/2019   Acute renal failure syndrome ([place]) 01/04/2019   Sacral insufficiency fracture 12/12/2018   Hip fracture ([place]) 12/12/2018   Amnesia 02/06/2018   ETD (Eustachian tube dysfunction), bilateral 11/07/2017   Chronic nonintractable headache 11/07/2017   Acute maxillary sinusitis 10/17/2017   Otalgia 10/17/2017   Aftercare 06/06/2017   Closed fracture of right olecranon process 05/09/2017   Pain in joint of right elbow 05/02/2017   Rib lesion 04/20/2017   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   Bronchiectasis without complication ([place]) 38/11/1749   Atopic dermatitis 04/08/2016   Skin sensation disturbance 03/08/2016   Weakness 12/22/2015   Allergic rhinitis 09/15/2015   CAD (coronary artery disease) 05/18/2015   Essential hypertension 05/18/2015   Carotid artery disease ([place]) 05/18/2015   Cough 03/19/2015   Left foot pain 09/02/2014   DOE (dyspnea on exertion) 12/26/2013   Abnormal gait 11/06/2013   Scoliosis (and kyphoscoliosis), idiopathic 07/30/2013   Acquired unequal leg length on left 04/11/2013   Disorder of lung 11/23/2012   History of fracture 10/18/2012   Palpitations 10/18/2012   Constipation 09/05/2012   History of sinus tachycardia 09/05/2012   Closed left subtrochanteric femur fracture S/P Open/closed and reduction, internal medullary fixation  09/05/2012   Acute posthemorrhagic anemia 08/22/2012  Spasm of back muscles 03/12/2012   Lumbar radiculopathy 11/23/2011   Leg weakness 11/23/2011   Mitral regurgitation 11/22/2011   Low compliance bladder 04/18/2011   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   Carpal tunnel syndrome 02/23/2009   Cardiovascular symptoms 02/23/2009   Vitamin D deficiency 02/23/2009   DIARRHEA-PRESUMED INFECTIOUS 08/18/2008   RECTAL BLEEDING 08/18/2008   PERSONAL HX COLONIC POLYPS 08/18/2008   DEGENERATIVE JOINT DISEASE 08/14/2008   SKIN  CANCER, HX OF 08/14/2008   CARPAL TUNNEL SYNDROME, HX OF 08/14/2008   SARCOIDOSIS, PULMONARY 08/08/2008   HLD (hyperlipidemia) 08/08/2008   Paroxysmal supraventricular tachycardia ([place]) 08/08/2008   GERD 08/08/2008    Past Medical History:  Diagnosis Date   Allergy    SEASONAL   Anemia    Arthritis    Asthma    "slight"    Baker's cyst of knee, right    CAD (coronary artery disease)    a. Myoview 10/15 - normal EF 70% // Myoview 11/16: EF 75%, normal perfusion, low risk // c. LHC 3/17 - LAD irregs, oLCx 70 (neg FFR), mRCA 30, EF 55-65% >> med Rx // Myoview 07/2018:  EF 73, extracardiac uptake, no significant ischemia (reviewed with Dr. Burt Knack), Low Risk // Myoview 6/22: EF 72, normal perfusion, low risk   Carotid stenosis    a. Carotid US 9/15 - Bilateral 1-39% ICA >> FU 2 years // b. Bilateral ICA 1-39 >> FU prn // Carotid US 06/2018: bilat 1-39; fu prn   Dyspnea    due to Sarcoidosis. When is windy or humed   Dysrhythmia    fast heart rate   Elbow fracture 04/2017   Right, had surgery   Esophageal reflux    Hematochezia    History of echocardiogram    a. Echo 10/15 - EF 60-65%, no RWMA   HLD (hyperlipidemia)    Hx of colonoscopy    Osteoporosis    Other chronic pulmonary heart diseases    Paroxysmal supraventricular tachycardia ([place])    Pneumonia    Sarcoidosis     Past Surgical History:  Procedure Laterality Date   arm surgery Right    BLADDER SURGERY     CARDIAC CATHETERIZATION N/A 05/20/2015   Procedure: Left Heart Cath and Coronary Angiography;  Surgeon: Sherren Mocha, MD;  Location: [facility];  Service: Cardiovascular;  Laterality: N/A;   CARPAL TUNNEL RELEASE Left    COLONOSCOPY     CYSTOSCOPY W/ RETROGRADES Left 07/30/2019   Procedure: CYSTOSCOPY WITH RETROGRADE PYELOGRAM LEFT STENT PLACEMENT;  Surgeon: Ardis Hughs, MD;  [facility];  Service: Urology;  Laterality: Left;   CYSTOSCOPY WITH RETROGRADE PYELOGRAM, URETEROSCOPY AND STENT  PLACEMENT Left 08/20/2019   Procedure: CYSTOSCOPY, URETEROSCOPY AND STENT PLACEMENT;  Surgeon: Robley Fries, MD;  Location: [facility];  Service: Urology;  Laterality: Left;  1 HR   FOOT SURGERY Right    HIP SURGERY Right 2011   full replacement   HOLMIUM LASER APPLICATION Left 05/24/4740   Procedure: HOLMIUM LASER APPLICATION;  Surgeon: Robley Fries, MD;  [facility];  Service: Urology;  Laterality: Left;   LEG SURGERY Left May 2014   femur fracture s/p open and closed reduction in [place], Dr. Jimmye Norman   ORIF ELBOW FRACTURE Right 05/09/2017   Procedure: ORIF right olecranon fracture with repair/reconstruction, ulnar nerve transposition as needed;  Surgeon: Roseanne Kaufman, MD;  Location: [facility];  Service: Orthopedics;  Laterality: Right;  Requests 90 mins   pelvis fracture  POLYPECTOMY     REVERSE SHOULDER ARTHROPLASTY Left 06/13/2019   Procedure: REVERSE SHOULDER ARTHROPLASTY;  Surgeon: Justice Britain, MD;  [facility];  Service: Orthopedics;  Laterality: Left;  130min   SHOULDER ARTHROSCOPY W/ ROTATOR CUFF REPAIR Right    TRAPEZIUM RESECTION Right     Current Outpatient Medications  Medication Sig Dispense Refill   acetaminophen (TYLENOL) 500 MG tablet Take 1,000 mg by mouth every 6 (six) hours as needed for mild pain.      albuterol (VENTOLIN HFA) 108 (90 Base) MCG/ACT inhaler Inhale 2 puffs into the lungs every 4 (four) hours as needed for wheezing or shortness of breath. 6.7 g 1   calcium carbonate (TUMS - DOSED IN MG ELEMENTAL CALCIUM) 500 MG chewable tablet Chew 1 tablet by mouth daily as needed for indigestion.      cetirizine (ZYRTEC) 10 MG tablet Take 1 tablet (10 mg total) by mouth daily. 30 tablet 1   denosumab (PROLIA) 60 MG/ML SOLN injection Inject 60 mg into the skin every 6 (six) months. Administer in upper arm, thigh, or abdomen     dextromethorphan (DELSYM) 30 MG/5ML liquid Take 30 mg by mouth 2 (two) times daily as needed for cough.     diltiazem (CARDIZEM  CD) 180 MG 24 hr capsule Take 180 mg by mouth at bedtime.      docusate sodium (COLACE) 50 MG capsule Take 100 mg by mouth at bedtime.      fluticasone (FLONASE) 50 MCG/ACT nasal spray Place 2 sprays into both nostrils 2 (two) times daily. 16 g 1   furosemide (LASIX) 20 MG tablet Take 1 tablet (20 mg total) by mouth daily. 90 tablet 3   melatonin 5 MG TABS Take 10 mg by mouth at bedtime.     nitroGLYCERIN (NITROSTAT) 0.4 MG SL tablet Place 1 tablet (0.4 mg total) under the tongue every 5 (five) minutes as needed for chest pain. 25 tablet 3   omeprazole (PRILOSEC) 40 MG capsule Take 40 mg by mouth 2 (two) times daily.      ondansetron (ZOFRAN-ODT) 4 MG disintegrating tablet Take 4 mg by mouth every 4 (four) hours as needed for nausea or vomiting.      oxybutynin (DITROPAN) 5 MG tablet Take 5 mg by mouth at bedtime.     oxyCODONE-acetaminophen (PERCOCET) 5-325 MG tablet Take 1 tablet by mouth every 4 (four) hours as needed (max 6 q for post op pain). 30 tablet 0   polyethylene glycol (MIRALAX / GLYCOLAX) 17 g packet Take 17 g by mouth daily.     rosuvastatin (CRESTOR) 10 MG tablet Take 1 tablet (10 mg total) by mouth 3 (three) times a week. Monday, Wednesday and Friday. 45 tablet 2   Vitamin D, Ergocalciferol, (DRISDOL) 50000 units CAPS capsule Take 50,000 Units by mouth every Friday.      apixaban (ELIQUIS) 2.5 MG TABS tablet Take 1 tablet (2.5 mg total) by mouth 2 (two) times daily. 180 tablet 1   ipratropium (ATROVENT HFA) 17 MCG/ACT inhaler Inhale 1 puff into the lungs 2 (two) times daily. 1 Inhaler 2   No current facility-administered medications for this visit.     ALLERGIES: Levaquin [levofloxacin], Sulfonamide derivatives, Oxycodone, and Morphine  Family History  Problem Relation Age of Onset   Colon cancer Mother 110   Cancer Mother    Lung cancer Father    Heart disease Father    Arrhythmia Father    Cancer Father    Cancer Sister  ovarian   Heart attack Brother     Esophageal cancer Neg Hx    Rectal cancer Neg Hx    Stomach cancer Neg Hx    Stroke Neg Hx     Social History   Socioeconomic History   Marital status: Widowed    Spouse name: Not on file   Number of children: 1   Years of education: Not on file   Highest education level: Some college, no degree  Occupational History   Occupation: retired    Comment: still works as Scientist, 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  Tobacco Use   Smoking status: Never   Smokeless tobacco: Never  Vaping Use   Vaping Use: Never used  Substance and Sexual Activity   Alcohol use: No   Drug use: No   Sexual activity: Not Currently    Partners: Male    Birth control/protection: Post-menopausal  Other Topics Concern   Not on file  Social History Narrative   Lives with daughter and son in law   decaffeinated  use:  Coffee 1 per day   Right handed   Social Determinants of Health   Financial Resource Strain: Not on file  Food Insecurity: Not on file  Transportation Needs: Not on file  Physical Activity: Not on file  Stress: Not on file  Social Connections: Not on file  Intimate Partner Violence: Not on file    ROS  PHYSICAL EXAMINATION:    BP 110/60 (BP Location: Left Arm, Patient Position: Sitting, Cuff Size: Normal)   Pulse 70   Resp (!) 22   Ht 5\' 2"  (1.575 m)   Wt 123 lb 6.4 oz (56 kg)   LMP 02/22/1983 (Approximate)   BMI 22.57 kg/m     General appearance: alert, cooperative and appears stated age  Pelvic: External genitalia:  persistent and enlarged abscess on the upper, inner left buttock, 1.5 cm. The area is tender, mildly erythematous, appears white under the center.  Procedure: incision and drainage The risks of the procedure were reviewed with the patient and a consent was signed. The area was cleansed with Hibiclens and injected with 1% lidocaine. Some drainage of white discharge with injecting the local. A #11 blade was used to open the abscess, minimal drainage. Culture taken.  Pressure used for hemostasis. After I&D no surrounding erythema.    The patient tolerated the procedure well.   Chaperone was present for exam.  1. Cutaneous abscess of buttock Persistent with conservative measures and a course of cipro. I&D performed, minimal drainage.  - Wound culture -Will not further treat with antibiotics at this point.  -Continue with warm compresses and hot soaks -F/U if not improving

## 2020-11-17 NOTE — Patient Instructions (Signed)
Continue with warm compresses and hot soaks  Call if not improving or with any concerns

## 2020-11-17 NOTE — Progress Notes (Signed)
See Dr Jertson's note 

## 2020-11-20 LAB — WOUND CULTURE
MICRO NUMBER:: 12428166
SPECIMEN QUALITY:: ADEQUATE

## 2020-11-24 ENCOUNTER — Ambulatory Visit (INDEPENDENT_AMBULATORY_CARE_PROVIDER_SITE_OTHER): Payer: Medicare Other | Admitting: Obstetrics and Gynecology

## 2020-11-24 ENCOUNTER — Other Ambulatory Visit: Payer: Self-pay

## 2020-11-24 ENCOUNTER — Encounter: Payer: Self-pay | Admitting: Obstetrics and Gynecology

## 2020-11-24 ENCOUNTER — Ambulatory Visit: Payer: Medicare Other | Admitting: Obstetrics and Gynecology

## 2020-11-24 VITALS — BP 110/60 | HR 66 | Wt 125.0 lb

## 2020-11-24 DIAGNOSIS — L0231 Cutaneous abscess of buttock: Secondary | ICD-10-CM

## 2020-11-24 DIAGNOSIS — I2510 Atherosclerotic heart disease of native coronary artery without angina pectoris: Secondary | ICD-10-CM

## 2020-11-24 NOTE — Progress Notes (Signed)
GYNECOLOGY  VISIT   HPI: 86 y.o.   Widowed White or Caucasian Not Hispanic or Latino  female   [id number] with Patient's last menstrual period was 02/22/1983 (approximate).   here for follow up for an abscess. She had I&D of an abscess on her left buttock last week. It is slowly healing, not as painful, but still sore. Not draining.   GYNECOLOGIC HISTORY: Patient's last menstrual period was 02/22/1983 (approximate). Contraception:PMP Menopausal hormone therapy: none         OB History     Gravida  1   Para  1   Term  1   Preterm  0   AB  0   Living  1      SAB  0   IAB  0   Ectopic  0   Multiple  0   Live Births  1              Patient Active Problem List   Diagnosis Date Noted   Right knee pain 06/08/2020   Age-related osteoporosis without current pathological fracture 05/01/2020   Hyperglycemia 05/01/2020   Impacted cerumen 05/01/2020   Nasal congestion 05/01/2020   Personal history of COVID-19 05/01/2020   Bilateral hearing loss 11/15/2019   Post-nasal drainage 11/15/2019   COPD (chronic obstructive pulmonary disease) ([place]) 10/30/2019   Edema 10/30/2019   Atrial fibrillation with RVR ([place])    Aspiration pneumonia ([place]) 09/19/2019   Pneumonia 09/18/2019   Sepsis ([place]) 07/30/2019   Pyelonephritis 07/29/2019   Hydronephrosis 07/29/2019   Adhesive capsulitis of right shoulder 07/29/2019   Heart disease 07/29/2019   Iron deficiency anemia 53/61/4431   Metabolic encephalopathy 54/00/8676   Sepsis due to Escherichia coli (e. coli) ([place]) 07/29/2019   Sepsis due to urinary tract infection ([place]) 07/18/2019   Leukocytosis 07/18/2019   Acute pain of left wrist 07/11/2019   S/P reverse total shoulder arthroplasty, 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   Pre-op evaluation 06/11/2019   Rotator cuff arthropathy of left shoulder 05/03/2019   Shoulder pain, left 04/29/2019   Low back pain 03/13/2019   Fracture of inferior pubic ramus ([place]) 02/28/2019   Degeneration of lumbar  intervertebral disc 02/14/2019   Acute renal failure syndrome ([place]) 01/04/2019   Sacral insufficiency fracture 12/12/2018   Hip fracture ([place]) 12/12/2018   Amnesia 02/06/2018   ETD (Eustachian tube dysfunction), bilateral 11/07/2017   Chronic nonintractable headache 11/07/2017   Acute maxillary sinusitis 10/17/2017   Otalgia 10/17/2017   Aftercare 06/06/2017   Closed fracture of right olecranon process 05/09/2017   Pain in joint of right elbow 05/02/2017   Rib lesion 04/20/2017   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   Bronchiectasis without complication ([place]) 19/50/9326   Atopic dermatitis 04/08/2016   Skin sensation disturbance 03/08/2016   Weakness 12/22/2015   Allergic rhinitis 09/15/2015   CAD (coronary artery disease) 05/18/2015   Essential hypertension 05/18/2015   Carotid artery disease ([place]) 05/18/2015   Cough 03/19/2015   Left foot pain 09/02/2014   DOE (dyspnea on exertion) 12/26/2013   Abnormal gait 11/06/2013   Scoliosis (and kyphoscoliosis), idiopathic 07/30/2013   Acquired unequal leg length on left 04/11/2013   Disorder of lung 11/23/2012   History of fracture 10/18/2012   Palpitations 10/18/2012   Constipation 09/05/2012   History of sinus tachycardia 09/05/2012   Closed left subtrochanteric femur fracture S/P Open/closed and reduction, internal medullary fixation  09/05/2012   Acute posthemorrhagic anemia 08/22/2012   Spasm of back muscles 03/12/2012   Lumbar  radiculopathy 11/23/2011   Leg weakness 11/23/2011   Mitral regurgitation 11/22/2011   Low compliance bladder 04/18/2011   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   Carpal tunnel syndrome 02/23/2009   Cardiovascular symptoms 02/23/2009   Vitamin D deficiency 02/23/2009   DIARRHEA-PRESUMED INFECTIOUS 08/18/2008   RECTAL BLEEDING 08/18/2008   PERSONAL HX COLONIC POLYPS 08/18/2008   DEGENERATIVE JOINT DISEASE 08/14/2008   SKIN CANCER, HX OF 08/14/2008   CARPAL TUNNEL SYNDROME, HX  OF 08/14/2008   SARCOIDOSIS, PULMONARY 08/08/2008   HLD (hyperlipidemia) 08/08/2008   Paroxysmal supraventricular tachycardia ([place]) 08/08/2008   GERD 08/08/2008    Past Medical History:  Diagnosis Date   Allergy    SEASONAL   Anemia    Arthritis    Asthma    "slight"    Baker's cyst of knee, right    CAD (coronary artery disease)    a. Myoview 10/15 - normal EF 70% // Myoview 11/16: EF 75%, normal perfusion, low risk // c. LHC 3/17 - LAD irregs, oLCx 70 (neg FFR), mRCA 30, EF 55-65% >> med Rx // Myoview 07/2018:  EF 73, extracardiac uptake, no significant ischemia (reviewed with Dr. Burt Knack), Low Risk // Myoview 6/22: EF 72, normal perfusion, low risk   Carotid stenosis    a. Carotid US 9/15 - Bilateral 1-39% ICA >> FU 2 years // b. Bilateral ICA 1-39 >> FU prn // Carotid US 06/2018: bilat 1-39; fu prn   Dyspnea    due to Sarcoidosis. When is windy or humed   Dysrhythmia    fast heart rate   Elbow fracture 04/2017   Right, had surgery   Esophageal reflux    Hematochezia    History of echocardiogram    a. Echo 10/15 - EF 60-65%, no RWMA   HLD (hyperlipidemia)    Hx of colonoscopy    Osteoporosis    Other chronic pulmonary heart diseases    Paroxysmal supraventricular tachycardia ([place])    Pneumonia    Sarcoidosis     Past Surgical History:  Procedure Laterality Date   arm surgery Right    BLADDER SURGERY     CARDIAC CATHETERIZATION N/A 05/20/2015   Procedure: Left Heart Cath and Coronary Angiography;  Surgeon: Sherren Mocha, MD;  Location: [facility];  Service: Cardiovascular;  Laterality: N/A;   CARPAL TUNNEL RELEASE Left    COLONOSCOPY     CYSTOSCOPY W/ RETROGRADES Left 07/30/2019   Procedure: CYSTOSCOPY WITH RETROGRADE PYELOGRAM LEFT STENT PLACEMENT;  Surgeon: Ardis Hughs, MD;  [facility];  Service: Urology;  Laterality: Left;   CYSTOSCOPY WITH RETROGRADE PYELOGRAM, URETEROSCOPY AND STENT PLACEMENT Left 08/20/2019   Procedure: CYSTOSCOPY,  URETEROSCOPY AND STENT PLACEMENT;  Surgeon: Robley Fries, MD;  [facility];  Service: Urology;  Laterality: Left;  1 HR   FOOT SURGERY Right    HIP SURGERY Right 2011   full replacement   HOLMIUM LASER APPLICATION Left 0/62/3762   Procedure: HOLMIUM LASER APPLICATION;  Surgeon: Robley Fries, MD;  [facility];  Service: Urology;  Laterality: Left;   LEG SURGERY Left May 2014   femur fracture s/p open and closed reduction in [place], Dr. Jimmye Norman   ORIF ELBOW FRACTURE Right 05/09/2017   Procedure: ORIF right olecranon fracture with repair/reconstruction, ulnar nerve transposition as needed;  Surgeon: Roseanne Kaufman, MD;  Location: [facility];  Service: Orthopedics;  Laterality: Right;  Requests 90 mins   pelvis fracture     POLYPECTOMY     REVERSE  SHOULDER ARTHROPLASTY Left 06/13/2019   Procedure: REVERSE SHOULDER ARTHROPLASTY;  Surgeon: Justice Britain, MD;  [facility];  Service: Orthopedics;  Laterality: Left;  166min   SHOULDER ARTHROSCOPY W/ ROTATOR CUFF REPAIR Right    TRAPEZIUM RESECTION Right     Current Outpatient Medications  Medication Sig Dispense Refill   acetaminophen (TYLENOL) 500 MG tablet Take 1,000 mg by mouth every 6 (six) hours as needed for mild pain.      albuterol (VENTOLIN HFA) 108 (90 Base) MCG/ACT inhaler Inhale 2 puffs into the lungs every 4 (four) hours as needed for wheezing or shortness of breath. 6.7 g 1   apixaban (ELIQUIS) 2.5 MG TABS tablet Take 1 tablet (2.5 mg total) by mouth 2 (two) times daily. 180 tablet 1   calcium carbonate (TUMS - DOSED IN MG ELEMENTAL CALCIUM) 500 MG chewable tablet Chew 1 tablet by mouth daily as needed for indigestion.      cetirizine (ZYRTEC) 10 MG tablet Take 1 tablet (10 mg total) by mouth daily. 30 tablet 1   denosumab (PROLIA) 60 MG/ML SOLN injection Inject 60 mg into the skin every 6 (six) months. Administer in upper arm, thigh, or abdomen     dextromethorphan (DELSYM) 30 MG/5ML liquid Take 30 mg by mouth 2  (two) times daily as needed for cough.     diltiazem (CARDIZEM CD) 180 MG 24 hr capsule Take 180 mg by mouth at bedtime.      docusate sodium (COLACE) 50 MG capsule Take 100 mg by mouth at bedtime.      fluticasone (FLONASE) 50 MCG/ACT nasal spray Place 2 sprays into both nostrils 2 (two) times daily. 16 g 1   furosemide (LASIX) 20 MG tablet Take 1 tablet (20 mg total) by mouth daily. 90 tablet 3   ipratropium (ATROVENT HFA) 17 MCG/ACT inhaler Inhale 1 puff into the lungs 2 (two) times daily. 1 Inhaler 2   melatonin 5 MG TABS Take 10 mg by mouth at bedtime.     nitroGLYCERIN (NITROSTAT) 0.4 MG SL tablet Place 1 tablet (0.4 mg total) under the tongue every 5 (five) minutes as needed for chest pain. 25 tablet 3   omeprazole (PRILOSEC) 40 MG capsule Take 40 mg by mouth 2 (two) times daily.      ondansetron (ZOFRAN-ODT) 4 MG disintegrating tablet Take 4 mg by mouth every 4 (four) hours as needed for nausea or vomiting.      oxybutynin (DITROPAN) 5 MG tablet Take 5 mg by mouth at bedtime.     oxyCODONE-acetaminophen (PERCOCET) 5-325 MG tablet Take 1 tablet by mouth every 4 (four) hours as needed (max 6 q for post op pain). 30 tablet 0   polyethylene glycol (MIRALAX / GLYCOLAX) 17 g packet Take 17 g by mouth daily.     rosuvastatin (CRESTOR) 10 MG tablet Take 1 tablet (10 mg total) by mouth 3 (three) times a week. Monday, Wednesday and Friday. 45 tablet 2   Vitamin D, Ergocalciferol, (DRISDOL) 50000 units CAPS capsule Take 50,000 Units by mouth every Friday.      No current facility-administered medications for this visit.     ALLERGIES: Levaquin [levofloxacin], Sulfonamide derivatives, Oxycodone, and Morphine  Family History  Problem Relation Age of Onset   Colon cancer Mother 1   Cancer Mother    Lung cancer Father    Heart disease Father    Arrhythmia Father    Cancer Father    Cancer Sister  ovarian   Heart attack Brother    Esophageal cancer Neg Hx    Rectal cancer Neg Hx     Stomach cancer Neg Hx    Stroke Neg Hx     Social History   Socioeconomic History   Marital status: Widowed    Spouse name: Not on file   Number of children: 1   Years of education: Not on file   Highest education level: Some college, no degree  Occupational History   Occupation: retired    Comment: still works as Scientist, 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  Tobacco Use   Smoking status: Never   Smokeless tobacco: Never  Vaping Use   Vaping Use: Never used  Substance and Sexual Activity   Alcohol use: No   Drug use: No   Sexual activity: Not Currently    Partners: Male    Birth control/protection: Post-menopausal  Other Topics Concern   Not on file  Social History Narrative   Lives with daughter and son in law   decaffeinated  use:  Coffee 1 per day   Right handed   Social Determinants of Health   Financial Resource Strain: Not on file  Food Insecurity: Not on file  Transportation Needs: Not on file  Physical Activity: Not on file  Stress: Not on file  Social Connections: Not on file  Intimate Partner Violence: Not on file    Review of Systems  All other systems reviewed and are negative.  PHYSICAL EXAMINATION:    BP 110/60   Pulse 66   Wt 125 lb (56.7 kg)   LMP 02/22/1983 (Approximate)   SpO2 100%   BMI 22.86 kg/m     General appearance: alert, cooperative and appears stated age  Pelvic: External genitalia:  no lesions, area of prior I&D on the left buttock is healing very well, almost completely closed, No surrounding erythema, no signs of infection              Urethra:  normal appearing urethra with no masses, tenderness or lesions              Bartholins and Skenes: normal                  Chaperone was present for exam.  1. Cutaneous abscess of buttock S/P I&D last week, healing well. No need for antibiotics.

## 2020-12-16 ENCOUNTER — Telehealth: Payer: Self-pay | Admitting: *Deleted

## 2020-12-16 NOTE — Telephone Encounter (Signed)
   Pre-operative Risk Assessment    Patient Name: [name]  DOB: Oct 12, 1934 MRN: [id number]      Request for Surgical Clearance   Procedure:   VITRECTOMY  Date of Surgery: Clearance 03/03/21                                 Surgeon:  DR. Ernst Breach Surgeon's Group or Practice Name:  [facility]  Phone number:  [phone] Fax number:  [phone]   Type of Clearance Requested: - Medical  - Pharmacy:  Hold Apixaban (Eliquis) 3 DAYS PRIOR   Type of Anesthesia:   MAC   Additional requests/questions:   Jiles Prows   12/16/2020, 6:22 PM

## 2020-12-21 NOTE — Telephone Encounter (Signed)
Patient with diagnosis of atrial fibrillation on Eliquis for anticoagulation.    Procedure: vitrectomy Date of procedure: 03/03/21   CHA2DS2-VASc Score = 5   This indicates a 7.2% annual risk of stroke. The patient's score is based upon: CHF History: 0 HTN History: 1 Diabetes History: 0 Stroke History: 0 Vascular Disease History: 1 Age Score: 2 Gender Score: 1    CrCl 41 Platelet count 193  Per office protocol, patient can hold Eliquis for 3 days prior to procedure.   Patient will not need bridging with Lovenox (enoxaparin) around procedure.

## 2020-12-25 NOTE — Telephone Encounter (Signed)
Our office received another request from [place] that date of procedure changed now to 01/27/21. There is a hand written note on clearance request, not sure if our office wrote note or the requesting office did. However, the note states "already cleared-clearance note in 12/16/20. Procedure date changed .  The clearance notes at this time have been addressed by Pharm-D though still need final clearance note from Pre op provider. I will re-send to pre op pool to address.

## 2020-12-25 NOTE — Telephone Encounter (Signed)
   Name: [name]  DOB: 06-16-1934  MRN: [id number]   Primary Cardiologist: Sherri Mocha, MD  Chart reviewed as part of pre-operative protocol coverage. Patient was contacted 12/25/2020 in reference to pre-operative risk assessment for pending surgery as outlined below.  [name] was last seen on 08/26/20 by [name], PA-C.  Since that day, [name] has done fine from a cardiac standpoint.  Her daughter [name] assists with history.  She has chronic DOE which is unchanged in recent months.  She has no complaints of chest pain.  Therefore, based on ACC/AHA guidelines, the patient would be at acceptable risk for the planned procedure without further cardiovascular testing.   The patient was advised that if she develops new symptoms prior to surgery to contact our office to arrange for a follow-up visit, and she verbalized understanding.  Per pharmacy recommendation, patient can hold Eliquis 3 days prior to her upcoming procedure with plans to restart as soon as she is cleared to do so by her surgeon.  I will route this recommendation to the requesting party via Epic fax function and remove from pre-op pool. Please call with questions.  [name], PA-C 12/25/2020, 3:06 PM

## 2021-01-18 ENCOUNTER — Other Ambulatory Visit: Payer: Self-pay

## 2021-01-18 ENCOUNTER — Encounter: Payer: Self-pay | Admitting: Neurology

## 2021-01-18 ENCOUNTER — Ambulatory Visit (INDEPENDENT_AMBULATORY_CARE_PROVIDER_SITE_OTHER): Payer: Medicare Other | Admitting: Neurology

## 2021-01-18 ENCOUNTER — Other Ambulatory Visit: Payer: Self-pay | Admitting: Neurology

## 2021-01-18 VITALS — BP 121/60 | HR 60 | Ht 63.0 in | Wt 126.0 lb

## 2021-01-18 DIAGNOSIS — G309 Alzheimer's disease, unspecified: Secondary | ICD-10-CM | POA: Diagnosis not present

## 2021-01-18 DIAGNOSIS — F0390 Unspecified dementia without behavioral disturbance: Secondary | ICD-10-CM | POA: Insufficient documentation

## 2021-01-18 DIAGNOSIS — F03A0 Unspecified dementia, mild, without behavioral disturbance, psychotic disturbance, mood disturbance, and anxiety: Secondary | ICD-10-CM

## 2021-01-18 DIAGNOSIS — I2510 Atherosclerotic heart disease of native coronary artery without angina pectoris: Secondary | ICD-10-CM

## 2021-01-18 DIAGNOSIS — R4189 Other symptoms and signs involving cognitive functions and awareness: Secondary | ICD-10-CM | POA: Insufficient documentation

## 2021-01-18 DIAGNOSIS — I999 Unspecified disorder of circulatory system: Secondary | ICD-10-CM

## 2021-01-18 DIAGNOSIS — R9082 White matter disease, unspecified: Secondary | ICD-10-CM

## 2021-01-18 MED ORDER — DONEPEZIL HCL 5 MG PO TABS: 5.0000 mg | ORAL_TABLET | Freq: Every day | ORAL | 0 refills | Status: DC

## 2021-01-18 NOTE — Progress Notes (Addendum)
[facility]    Provider:  Dr Lucia Gaskins Referring Provider: Chilton Greathouse, MD Primary Care Physician:  Chilton Greathouse, MD  CC:  Memory loss and fatigue  Interval history 01/18/2021: Patient is here for follow-up of memory problems.  She has been having issues since 2017 and we have been following her.  We did refer her for formal memory testing in the past but I do not think that that was completed.  MMSE in September 2019 was 29 out of 30, she was seen by one of our nurse practitioners in February 2022 and her MMSE was 28 out of 30 but she had also recently had a UTI at the time and seeing as though her memory was stable we continue to follow her.  She is here again today and states that her memory is declining.  Repeat CT in May 2021 appeared stable. Memory has gotten very bad, forgetting conversations in the same day, she is still driving, no accidents or getting lost, she does get confused about where she is going but she will remember eventually, daughter is worried about it, she has had 3 surgeries on her eyes, she did have 3 surgeries in 2021 after falls,    CT HEAD May 2021: IMPRESSION: 1. No acute intracranial abnormality. No skull fracture. 2. Age related atrophy and chronic small vessel ischemia.  Interval history 11/02/2017: MMSE improved. Still c/o memory changes since 12/2015 however MMSE stable even improved. Sleep test was very mild for OSA and cpap was offered. MRI in 2017 did shpw mild diffuse and moderate perisylvian atrophy. Extensive testing in the past was normal (b12, mma, ace, sed, ana, sjogren,rf,heavy metals,IFE,b1,ck, AcRAb). She has a new issue, vertigo, left hearing loss, imbalance, worsening over last several months, no inciting events, no drainage or congestion. She is scheduled to see an ENT.  CT head 2019: reviewed images and agree with the following:  IMPRESSION: 1. No CT evidence for acute intracranial abnormality. 2. Atrophy and moderate  small vessel ischemic changes of the white matter.  Interval history 01/02/2017: MMSE from 28/30 to 27/30 but improved to 29/30. Still c/o memory changes and extreme fatigue. Discussed sleep study for her fatigue and frequent awakenings and memory issues, need sleep eval for OSA or PLMS.  If this is negative/normal will send for formal neurocognitive testing.    Interval history 08/31/2016: This is an absolutely lovely 86 year old female here for follow-up. She is having short-term memory loss. A few hours or the next day she forgets conversations or things she's been told. They called and invited her to their sister's birthday party, she couldn't remember the restaurant name. Started a few years ago and is worsening. No Fhx of dementia. She lives with daughter and son in [place]. 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  Patient snores heavily, she wakes up frequently to urinate, falls asleep during the day, her son-in-law heard her snoring through her door. She also has extreme fatigue during the day. Recommended sleep apnea evaluation.   HPI:  [name] is a 86 y.o. female for follow up of fatigue and weakness and a new problem memory loss. Past medical history of hypertension, carotid bruit right, degenerative joint disease, fatigue, sarcoidosis, hyperlipidemia, left leg femur broken status- post rods, right hip replacement. She has significant fatigue. She complains of generalized weakness, fatigue, imbalance, sinking feelings like she is going to pass out, decreased energy, decreased smell and loss of taste, generally  feeling bad. Extensive lab testing including CK, acetylcholine receptor antibodies, B1, multiple myeloma panel, heavy metals, rheumatoid factor, Sjogren's antibodies, ANA with reflex, sedimentation rate, angiotensin-converting enzyme, TSH, methylmalonic acid, B12 and folate all normal. MRI of the brain showed Mild diffuse and moderate  perisylvian atrophy and mild small vessel disease but no etiology for her symptoms. EMG/NCS today to evaluate for other neuromuscular disorders such as myopathy/myositis. Reviewed all results with patient today.    Reviewed notes, labs and imaging from outside physicians, which showed:   B12 283. This was drawn 12/07/2015. CMP showed elevated MCV 104, BUN 24, creatinine 0.8 otherwise unremarkable drawn 11/30/2015.    Patient was seen a Technical sales engineer. Complaining of strange sensations in her head, like she is thinking, kind of weak, could not describe it. Feels like she is having "sinking spells. Feels that balance, has also been seen by cardiology in October 2017. She was evaluated for nonobstructive one-vessel coronary artery disease, is continuing aspirin and statin PSVT without recurrence on calcium channel blocker, blood pressure control, history of hyperlipidemia intolerant of statins however is using Crestor twice a week, follow-up Dopplers and carotid artery disease were ordered, also discussed fatigue after eating, she follows with pulmonology for history of sarcoidosis. She has arthritis     Patient complains of symptoms per HPI as well as the following symptoms: cognitive decline . Pertinent negatives and positives per HPI. 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  Tobacco Use   Smoking status: Never   Smokeless tobacco: Never  Vaping Use   Vaping Use: Never used  Substance and Sexual Activity   Alcohol use: No   Drug use: No   Sexual activity: Not Currently    Partners: Male    Birth control/protection: Post-menopausal  Other Topics Concern   Not on file  Social  History Narrative   Lives with daughter and son in law   decaffeinated  use:  Coffee 1 per day   Right handed   Social Determinants of Health   Financial Resource Strain: Not on file  Food Insecurity: Not on file  Transportation Needs: Not on file  Physical Activity: Not on file  Stress: Not on file  Social Connections: Not on file  Intimate Partner Violence: Not on file    Family History  Problem Relation Age of Onset   Colon cancer Mother 52   Cancer Mother    Lung cancer Father    Heart disease Father    Arrhythmia Father    Cancer Father    Cancer Sister        ovarian   Heart attack Brother    Esophageal cancer Neg Hx    Rectal cancer Neg Hx    Stomach cancer Neg Hx    Stroke Neg Hx     Past Medical History:  Diagnosis Date   Acute maxillary sinusitis 10/17/2017   Acute renal failure syndrome (HCC) 01/04/2019   Allergy    SEASONAL   Anemia    Arthritis    Asthma    "slight"    Baker's cyst of knee, right    CAD (coronary artery disease)  a. Myoview 10/15 - normal EF 70% // Myoview 11/16: EF 75%, normal perfusion, low risk // c. LHC 3/17 - LAD irregs, oLCx 70 (neg FFR), mRCA 30, EF 55-65% >> med Rx // Myoview 07/2018:  EF 73, extracardiac uptake, no significant ischemia (reviewed with Dr. Excell Seltzer), Low Risk // Myoview 6/22: EF 72, normal perfusion, low risk   Carotid stenosis    a. Carotid US 9/15 - Bilateral 1-39% ICA >> FU 2 years // b. Bilateral ICA 1-39 >> FU prn // Carotid US 06/2018: bilat 1-39; fu prn   Dyspnea    due to Sarcoidosis. When is windy or humed   Dysrhythmia    fast heart rate   Elbow fracture 04/2017   Right, had surgery   Esophageal reflux    Fracture of inferior pubic ramus (HCC) 02/28/2019   Hematochezia    Hip fracture (HCC) 12/12/2018   History of echocardiogram    a. Echo 10/15 - EF 60-65%, no RWMA   HLD (hyperlipidemia)    Hx of colonoscopy    Macular pucker, right eye    will have removed on 01/26/21   Osteoporosis    Other  chronic pulmonary heart diseases    Paroxysmal supraventricular tachycardia (HCC)    Pneumonia    Pneumonia 09/18/2019   Pre-op evaluation 06/11/2019   Pyelonephritis 07/29/2019   Sacral insufficiency fracture 12/12/2018   Sarcoidosis    Sepsis (HCC) 07/30/2019   Sepsis due to Escherichia coli (e. 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   COLONOSCOPY     CYSTOSCOPY W/ RETROGRADES Left 07/30/2019   Procedure: CYSTOSCOPY WITH RETROGRADE PYELOGRAM LEFT STENT PLACEMENT;  Surgeon: Crist Fat, MD;  Location: [facility];  Service: Urology;  Laterality: Left;   CYSTOSCOPY WITH RETROGRADE PYELOGRAM, URETEROSCOPY AND STENT PLACEMENT Left 08/20/2019   Procedure: CYSTOSCOPY, URETEROSCOPY AND STENT PLACEMENT;  Surgeon: Noel Christmas, MD;  Location: [facility];  Service: Urology;  Laterality: Left;  1 HR   FOOT SURGERY Right    HIP SURGERY Right 2011   full replacement   HOLMIUM LASER APPLICATION Left 08/20/2019   Procedure: HOLMIUM LASER APPLICATION;  Surgeon: Noel Christmas, MD;  Location: [facility];  Service: Urology;  Laterality: Left;   LEFT HEART CATH AND CORONARY ANGIOGRAPHY N/A 08/09/2021   Procedure: LEFT HEART CATH AND CORONARY ANGIOGRAPHY;  Surgeon: Tonny Bollman, MD;  Location: [facility] INVASIVE CV LAB;  Service: Cardiovascular;  Laterality: N/A;   LEG SURGERY Left 06/2012   femur fracture s/p open and closed reduction in [place], Dr. Mayford Knife   lens replacement Right    right eye   ORIF ELBOW FRACTURE Right 05/09/2017   Procedure: ORIF right olecranon fracture with  repair/reconstruction, ulnar nerve transposition as needed;  Surgeon: Dominica Severin, MD;  Location: [facility] OR;  Service: Orthopedics;  Laterality: Right;  Requests 90 mins   pelvis fracture     POLYPECTOMY     REVERSE SHOULDER ARTHROPLASTY Left 06/13/2019   Procedure: REVERSE SHOULDER ARTHROPLASTY;  Surgeon: Francena Hanly, MD;  Location: [facility];  Service:  Orthopedics;  Laterality: Left;    SHOULDER ARTHROSCOPY W/ ROTATOR CUFF REPAIR Right    TRAPEZIUM RESECTION Right     Current Outpatient Medications  Medication Sig Dispense Refill   acetaminophen (TYLENOL) 500 MG tablet Take 1,000 mg by mouth every 6 (six) hours as needed for mild pain.      albuterol (VENTOLIN HFA) 108 (90 Base) MCG/ACT inhaler Inhale 2 puffs into the lungs every 4 (four) hours as needed for wheezing or shortness of breath. (Patient taking differently: Inhale 2 puffs into the lungs 4 (four) times daily as needed for wheezing or shortness of breath.) 6.7 g 1   denosumab (PROLIA) 60 MG/ML SOLN injection Inject 60 mg into the skin every 6 (six) months. Administer in upper arm, thigh, or abdomen     diltiazem (CARDIZEM CD) 180 MG 24 hr capsule Take 180 mg by mouth at bedtime.      nitroGLYCERIN (NITROSTAT) 0.4 MG SL tablet Place 1 tablet (0.4 mg total) under the tongue every 5 (five) minutes as needed for chest pain. 25 tablet 3   omeprazole (PRILOSEC) 40 MG capsule Take 40 mg by mouth 2 (two) times daily.      oxybutynin (DITROPAN) 5 MG tablet Take 5 mg by mouth at bedtime.     rosuvastatin (CRESTOR) 10 MG tablet Take 1 tablet (10 mg total) by mouth 3 (three) times a week. Monday, Wednesday and Friday. (Patient taking differently: Take 10 mg by mouth every Monday, Wednesday, and Friday.) 45 tablet 2   apixaban (ELIQUIS) 2.5 MG TABS tablet TAKE 1 TABLET(2.5 MG) BY MOUTH TWICE DAILY (Patient taking differently: Take 2.5 mg by mouth 2 (two) times daily.) 180 tablet 1   aspirin 81 MG chewable tablet Chew 324 mg by mouth once.      budesonide-formoterol (SYMBICORT) 160-4.5 MCG/ACT inhaler Inhale 2 puffs into the lungs 2 (two) times daily.     Calcium Carbonate Antacid (TUMS ULTRA 1000 PO) Take 2,000-3,000 mg by mouth at bedtime as needed (acid reflux/indigestion/heartburn).     donepezil (ARICEPT) 5 MG tablet TAKE 1 TABLET(5 MG) BY MOUTH AT BEDTIME (Patient taking differently: Take 5 mg by mouth at bedtime.) 90 tablet 1   metoprolol tartrate (LOPRESSOR) 25 MG tablet Take 0.5 tablets (12.5 mg total) by mouth 2 (two) times daily. 30 tablet 0   sertraline (ZOLOFT) 25 MG tablet Take 25 mg by mouth at bedtime.     traMADol (ULTRAM) 50 MG tablet Take 50 mg by mouth every 8 (eight) hours as needed (pain).     Vibegron (GEMTESA) 75 MG TABS Take 75 mg by mouth at bedtime.     No current facility-administered medications for this visit.    Allergies as of 01/18/2021 - Review Complete 01/18/2021  Allergen Reaction Noted   Levaquin [levofloxacin] Other (See Comments) 06/10/2016   Sulfonamide derivatives  08/08/2008   Oxycodone Nausea Only 07/18/2019   Morphine Nausea Only 08/08/2008    Vitals: BP 121/60 (BP Location: Right Arm, Patient Position: Sitting)   Pulse 60   Ht 5\' 3"  (1.6 m)   Wt 126 lb (57.2 kg)   LMP 02/22/1983 (Approximate)   BMI 22.32 kg/m  Last Weight:  Wt Readings from Last 1 Encounters:  08/08/21 119 lb 15.9 oz (54.4 kg)   Last Height:   Ht Readings from Last 1 Encounters:  08/07/21 5\' 3"  (1.6 m)    Physical exam: Exam: Gen: NAD, anxious  CV: RRR, no MRG. No Carotid Bruits. No peripheral edema, warm, nontender Eyes: Conjunctivae clear without exudates or hemorrhage  Neuro: Detailed Neurologic Exam  Speech:    Speech is normal; fluent and spontaneous with normal comprehension.  Cognition:    01/18/2021   11:06 AM 04/20/2020    1:37 PM 11/02/2017    8:38 AM  MMSE - Mini Mental State Exam  Orientation to time 4 5 5   Orientation to Place 4 5 5   Registration 3 3 3    Attention/ Calculation 5 5 5   Recall 2 2 3   Language- name 2 objects 2 2 2   Language- repeat 1 0 0  Language- follow 3 step command 3 3 3   Language- read & follow direction 1 1 1   Write a sentence 1 1 1   Copy design 0 1 1  Total score 26 28 29     Cranial Nerves:    The pupils are equal, round, and reactive to light.Attempted fundoscopic exam but could not visualize due to small pupils. Visual fields are full to finger confrontation. Extraocular movements are intact. Trigeminal sensation is intact and the muscles of mastication are normal. The face is symmetric. The palate elevates in the midline. Hearing intact. Voice is normal. Shoulder shrug is normal. The tongue has normal motion without fasciculations.   Coordination:    No dysmetria  Gait:    antalgic and slow but not shuffling, good arm swing, good stride, uses cane  Motor Observation:    No asymmetry, no atrophy, and no involuntary movements noted. Tone:    Normal muscle tone.    Posture:    Posture is slightly stooped    Strength:    Strength is symmetric and stable in the upper and lower limbs.      Sensation: intact to LT     Reflex Exam:  DTR's:    Deep tendon reflexes in the upper and lower extremities are symmetrical bilaterally.   Toes:    The toes are equiv bilaterally (right toe surgery in past) Clonus:    Clonus is absent.          Assessment/Plan:  Absolutely lovely 86 y.o. female here as a referral from Dr. Felipa Eth initially years ago for for fatigue and weakness,also memory loss since 2017. Extensive workup has been negative.  Today c/o memory changes again progressive (since 2017). MMSE stable 29/30. Tried to reassure patient but feel formal testing at this point a good idea. Past medical history of hypertension, carotid bruit right, degenerative joint disease, fatigue, sarcoidosis, hyperlipidemia, left leg femur broken status post rods, right hip replacement. She has significant fatigue. She complains  of chronic generalized weakness, fatigue,  generally feeling bad which is stable, continued memory changes. MRI 2019 with Mild-moderate atrophy and mild chronic small vessel ischemic disease, repeat CTs stable.   MMSE from 29/30 to 28/30 to 27/30 but today 26/30. Still c/o progressive memory changes and extreme fatigue. Sleep eval with mild sleep apnea, borderline for cpap. Nothing significant. She still c/o progressive memory loss, ongoing since 2017. Sleep test unremarkable. Workup unremarkable. Will send for formal memory testing.  MRI of the brain in 2017 showed Mild-moderate atrophy and mild chronic small vessel ischemic disease. Will repeat for preogressiona nd reversible causes of dementia and cognitive decline.  Emg/ncs was unremarkable  Extensive testing in the past was normal (b12, mma, ace, sed, ana, sjogren,rf,heavy metals,IFE,b1,ck,  AcRAb)  FDG Pet scan to eval bewteen Alzheimer's and FTD vs pseudidementia and formal memory testing.  Start Aricept, sent a note to cardiology to ensure is ok (pulse 60)    Orders Placed This Encounter  Procedures   NM PET Metabolic Brain   MR BRAIN W WO CONTRAST   Ambulatory referral to Neuropsychology   Meds ordered this encounter  Medications   DISCONTD: donepezil (ARICEPT) 5 MG tablet    Sig: Take 1 tablet (5 mg total) by mouth at bedtime.    Dispense:  30 tablet    Refill:  0     Naomie Dean, MD  [facility] [address] [place], [place] [address]  Phone [phone] Fax [phone]  I spent over 45 minutes of face-to-face and non-face-to-face time with patient on the  1. Cognitive decline   2. Mild dementia without behavioral disturbance, psychotic disturbance, mood disturbance, or anxiety, unspecified dementia type   3. Alzheimer's disease, unspecified (CODE) (HCC)   4. 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  This included previsit chart review, lab  review, study review, order entry, electronic health record documentation, patient education on the different diagnostic and therapeutic options, counseling and coordination of care, risks and benefits of management, compliance, or risk factor reduction

## 2021-01-18 NOTE — Patient Instructions (Addendum)
Formal memory testing Starting Aricept FDG PET Scan  Donepezil Disintegrating Tablets What is this medication? DONEPEZIL (doe NEP e zil) treats memory loss and confusion (dementia) in people who have Alzheimer disease. It works by improving attention, memory, and the ability to engage in daily activities. It is not a cure for dementia or Alzheimer disease. This medicine may be used for other purposes; ask your health care provider or pharmacist if you have questions. COMMON BRAND NAME(S): Aricept What should I tell my care team before I take this medication? They need to know if you have any of these conditions: Asthma or other lung disease Difficulty passing urine Head injury Heart disease History of irregular heartbeat Liver disease Seizures (convulsions) Stomach or intestinal disease, ulcers or stomach bleeding An unusual or allergic reaction to donepezil, other medications, foods, dyes, or preservatives Pregnant or trying to get pregnant Breast-feeding How should I use this medication? Take this medication by mouth. Follow the directions on the prescription label. Place the tablet in the mouth and allow it to dissolve, then swallow. While you may take these tablets with water, it is not necessary to do so. You may take this medication with or without food. Take your doses at regular intervals. This medication is usually taken before bedtime. Do not take your medication more often than directed. Continue to take your medication even if you feel better. Do not stop taking except on the advice of your care team. Talk to your care team about the use of this medication in children. Special care may be needed. Overdosage: If you think you have taken too much of this medicine contact a poison control center or emergency room at once. NOTE: This medicine is only for you. Do not share this medicine with others. What if I miss a dose? If you miss a dose, take it as soon as you can. If it is  almost time for your next dose, take only that dose. Do not take double or extra doses. What may interact with this medication? Do not take this medication with any of the following: Certain medications for fungal infections like itraconazole, fluconazole, posaconazole, and voriconazole Cisapride Dextromethorphan; quinidine Dronedarone Pimozide Quinidine Thioridazine This medication may also interact with the following: Antihistamines for allergy, cough and cold Atropine Bethanechol Carbamazepine Certain medications for bladder problems like oxybutynin, tolterodine Certain medications for Parkinson's disease like benztropine, trihexyphenidyl Certain medications for stomach problems like dicyclomine, hyoscyamine Certain medications for travel sickness like scopolamine Dexamethasone Dofetilide Ipratropium NSAIDs, medications for pain and inflammation, like ibuprofen or naproxen Other medications for Alzheimer's disease Other medications that prolong the QT interval (cause an abnormal heart rhythm) Phenobarbital Phenytoin Rifampin, rifabutin or rifapentine Ziprasidone This list may not describe all possible interactions. Give your health care provider a list of all the medicines, herbs, non-prescription drugs, or dietary supplements you use. Also tell them if you smoke, drink alcohol, or use illegal drugs. Some items may interact with your medicine. What should I watch for while using this medication? Visit your care team for regular checks on your progress. Check with your care team if your symptoms do not get better or if they get worse. You may get drowsy or dizzy. Do not drive, use machinery, or do anything that needs mental alertness until you know how this medication affects you. What side effects may I notice from receiving this medication? Side effects that you should report to your care team as soon as possible: Allergic reactions--skin rash, itching, hives, swelling  of the  face, lips, tongue, or throat Peptic ulcer--burning stomach pain, loss of appetite, bloating, burping, heartburn, nausea, vomiting Seizures Slow heartbeat--dizziness, feeling faint or lightheaded, confusion, trouble breathing, unusual weakness or fatigue Stomach bleeding--bloody or black, tar-like stools, vomiting blood or brown material that looks like coffee grounds Trouble passing urine Side effects that usually do not require medical attention (report these to your care team if they continue or are bothersome): Diarrhea Fatigue Loss of appetite Muscle pain or cramps Nausea Trouble sleeping This list may not describe all possible side effects. Call your doctor for medical advice about side effects. You may report side effects to FDA at 1-800-FDA-1088. Where should I keep my medication? Keep out of reach of children. Store at room temperature between 15 and 30 degrees C (59 and 86 degrees F). Throw away any unused medication after the expiration date. NOTE: This sheet is a summary. It may not cover all possible information. If you have questions about this medicine, talk to your doctor, pharmacist, or health care provider.  2022 Elsevier/Gold Standard (2020-09-23 00:00:00)

## 2021-01-19 ENCOUNTER — Telehealth: Payer: Self-pay | Admitting: Neurology

## 2021-01-19 NOTE — Telephone Encounter (Signed)
Please call patient AND her daughter please (patient has memory problems, best to talk to both). Dr. Burt Knack was fine with starting the Aricept, also Dr. Antionette Char office will call to bring her in for an EKG/nurse visit in a few weeks to confirm that her rhythm is ok after starting the Aricept. Let her know asap before Dr. Burt Knack calls so they understand what is going on and why he is calling thanks!  :)

## 2021-01-19 NOTE — Telephone Encounter (Signed)
Spoke with patient and discussed message from Dr Jaynee Eagles regarding Aricept and EKG as noted below by Dr Jaynee Eagles. She verbalized understanding. I called her daughter, [name], and LVM asking for call back.

## 2021-01-19 NOTE — Telephone Encounter (Signed)
Patient's daughter returned my call.  We discussed the message below as noted by Dr. Jaynee Eagles.  She understands that Dr. Burt Knack is fine with patient starting Aricept but his office will be contacting her to schedule an EKG within the next few weeks to make sure her heart rhythm is okay.  Sharon's questions were answered and she verbalized appreciation for the call.  She will also be on the look out for side effects such as GI upset.  She will let us know if there are any concerns.

## 2021-01-20 ENCOUNTER — Telehealth: Payer: Self-pay

## 2021-01-20 NOTE — Telephone Encounter (Signed)
Left a message for the pt to call back...  She needs an EKG in 2 weeks after starting Aricept... she is seeing [name] PA 02/03/21 and need to be sure that she is staring the med today or tomorrow and if that appt would then be time appropriate.

## 2021-01-20 NOTE — Telephone Encounter (Signed)
-----   Message from Sherren Mocha, MD sent at 01/20/2021 11:58 AM EST ----- Regarding: FW: aricept for mutual patient with bradycardia See below. Please have patient come in 2 weeks after starting Aricept for an EKG. Thank you ----- Message ----- From: Melvenia Beam, MD Sent: 01/19/2021  12:20 PM EST To: Sherren Mocha, MD Subject: RE: aricept for mutual patient with bradycar#  Yes thank you !! You are the BEST!  ----- Message ----- From: Sherren Mocha, MD Sent: 01/18/2021   5:11 PM EST To: Melvenia Beam, MD Subject: RE: aricept for mutual patient with bradycar#  Yes I think this is fine. I could bring her in for an EKG/nurse visit in a few weeks to confirm that her rhythm is ok. If you would like I'll have one of our nurses reach out to set that up.  Thx Ronalee Belts ----- Message ----- From: Melvenia Beam, MD Sent: 01/18/2021  11:46 AM EST To: Sherren Mocha, MD Subject: aricept for mutual patient with bradycardia    Hey Dr. Burt Knack, I am going to start this patient on Aricept, she is having cognitive decline. It can sometimes cause bradycardia and her pulse is 60 today so I wanted to make sure it was ok with you. I'll tell her to stop for any side effects. Let me know, thanks Vivien Rota

## 2021-01-21 NOTE — Telephone Encounter (Signed)
Spoke with pt who reports she started Aricept yesterday 01/20/2021.  Pt advised will need EKG completed at 02/03/2021 appointment with [facility].  Pt verbalizes understanding and agrees with current plan.

## 2021-01-21 NOTE — Telephone Encounter (Signed)
Pt returning phone call... please advise  

## 2021-01-22 ENCOUNTER — Encounter: Payer: Self-pay | Admitting: Psychology

## 2021-02-02 ENCOUNTER — Telehealth: Payer: Self-pay | Admitting: Neurology

## 2021-02-02 NOTE — Telephone Encounter (Signed)
Medicare & supp no auth req- sent to [facility] they will reach out to the patient to schedule.

## 2021-02-03 ENCOUNTER — Ambulatory Visit: Payer: Medicare Other | Admitting: Physician Assistant

## 2021-02-03 DIAGNOSIS — I5032 Chronic diastolic (congestive) heart failure: Secondary | ICD-10-CM | POA: Insufficient documentation

## 2021-02-03 NOTE — Progress Notes (Deleted)
Cardiology Office Note:    Date:  02/03/2021   ID:  [name], DOB [birth year], MRN [id number]  PCP:  [name], MD   [facility] Providers Cardiologist:  Sherren Mocha, MD Cardiology APP:  Liliane Shi, PA-C { Click to update primary MD,subspecialty MD or APP then REFRESH:1}  *** Referring MD: [name], MD   Chief Complaint:  No chief complaint on file. {Click here for Visit Info    :1}   Patient Profile:   [name] is a 86 y.o. female with:  Coronary artery disease Cath 3/17: oLCx 60-70 (neg by FFR) Myoview 11/16: no ischemia Myoview 07/2018: EF 73, prob low risk (significant extracardiac uptake), no ischemia Myoview 6/22: low risk  Paroxysmal atrial fibrillation  CHA2DS2-VASc=5 (CAD, HTN, age x 2, female) >> Apixaban 2.5 mg (> 30 yo, wt <60 kg)  (HFpEF) heart failure with preserved ejection fraction  Echo 8/21: EF 55-60, GR 1 DD, RVSP 24.2, trivial MR Pulmonary sarcoidosis Echo 8/21: RVSP 24.2 Echo 5/21: RVSP 36.9 COPD Bronchiectasis Hypertension Hyperlipidemia SVT Carotid stenosis [place] 06/2018: bilat 1-39  History of Present Illness: Ms. Whiteaker ***      ASSESSMENT & PLAN:   No problem-specific Assessment & Plan notes found for this encounter. 1. Chronic heart failure with preserved ejection fraction (HCC) EF normal by echocardiogram 8/21 with mild diastolic dysfunction.  BNP improved with diuresis.  Overall, volume status is currently stable.  Continue current dose of furosemide.  Obtain follow-up BMET today.   2. Coronary artery disease involving native coronary artery of native heart without angina pectoris Moderate nonobstructive disease by cardiac catheterization in 2017.  Recent Myoview low risk.  She is not having chest discomfort.  She is not on aspirin as she is on Apixaban.  Continue rosuvastatin.   3. Paroxysmal atrial fibrillation (HCC) She appears to be maintaining sinus rhythm by exam.  She is on the appropriate dose  of Apixaban given her age and weight.  Continue current management.   4. Essential hypertension Blood pressure is controlled.  She does note occasional episodes of dizziness.  If her pressure starts to run lower, we can decrease her diltiazem to 120 mg daily.     5. Mixed hyperlipidemia Fair control.  Continue current dose of rosuvastatin.   6. Pulmonary Sarcoidosis  Continue follow-up with pulmonology.   7. Memory loss She plans to follow-up with neurology.       {Are you ordering a CV Procedure (e.g. stress test, cath, DCCV, TEE, etc)?   Press F2        :[id number]}   Dispo:  No follow-ups on file.    Prior CV studies: GATED SPECT MYO PERF W/LEXISCAN STRESS 1D 08/05/2020 Narrative  The left ventricular ejection fraction is hyperdynamic (>65%).  Nuclear stress EF: 72%.  There was no ST segment deviation noted during stress.  No T wave inversion was noted during stress.  The study is normal.  This is a low risk study.   Echocardiogram 09/23/19 EF 55-60, GR 1 DD, RVSP 24.2, trivial MR   Echocardiogram 07/19/2019 EF 55-60, mild LVH, GR 1 DD, RVSP 36.9, trivial MR   Myoview 08/02/2018 EF: 73%, sig extracardiac uptake at rest, prob low risk   Carotid US 07/09/2018 Bilat ICA 1-39   Carotid US 10/17 bilat ICA 1-39 >> FU prn   LHC 05/20/15 LAD plaque - nonobstructive LCx ost 70% >> FFR 0.99 > 0.95 (not hemodynamically significant); mid 40% RCA mid 30% EF 55-65% {Select  studies to display:26339}    Past Medical History:  Diagnosis Date   Allergy    SEASONAL   Anemia    Arthritis    Asthma    "slight"    Baker's cyst of knee, right    CAD (coronary artery disease)    a. Myoview 10/15 - normal EF 70% // Myoview 11/16: EF 75%, normal perfusion, low risk // c. LHC 3/17 - LAD irregs, oLCx 70 (neg FFR), mRCA 30, EF 55-65% >> med Rx // Myoview 07/2018:  EF 73, extracardiac uptake, no significant ischemia (reviewed with Dr. Burt Knack), Low Risk // Myoview 6/22: EF 72, normal  perfusion, low risk   Carotid stenosis    a. Carotid US 9/15 - Bilateral 1-39% ICA >> FU 2 years // b. Bilateral ICA 1-39 >> FU prn // Carotid US 06/2018: bilat 1-39; fu prn   Dyspnea    due to Sarcoidosis. When is windy or humed   Dysrhythmia    fast heart rate   Elbow fracture 04/2017   Right, had surgery   Esophageal reflux    Hematochezia    History of echocardiogram    a. Echo 10/15 - EF 60-65%, no RWMA   HLD (hyperlipidemia)    Hx of colonoscopy    Macular pucker, right eye    will have removed on 01/26/21   Osteoporosis    Other chronic pulmonary heart diseases    Paroxysmal supraventricular tachycardia ([place])    Pneumonia    Sarcoidosis    Current Medications: No outpatient medications have been marked as taking for the 02/03/21 encounter (Appointment) with [name] T, PA-C.    Allergies:   Levaquin [levofloxacin], Sulfonamide derivatives, Oxycodone, and Morphine   Social History   Tobacco Use   Smoking status: Never   Smokeless tobacco: Never  Vaping Use   Vaping Use: Never used  Substance Use Topics   Alcohol use: No   Drug use: No    Family Hx: The patient's family history includes Arrhythmia in her father; Cancer in her father, mother, and sister; Colon cancer (age of onset: 32) in her mother; Heart attack in her brother; Heart disease in her father; Lung cancer in her father. There is no history of Esophageal cancer, Rectal cancer, Stomach cancer, or Stroke.  ROS   EKGs/Labs/Other Test Reviewed:    EKG:  EKG is *** ordered today.  The ekg ordered today demonstrates ***  Recent Labs: 07/21/2020: ALT 8; Hemoglobin 12.7; Platelets 309 07/29/2020: NT-Pro BNP 836 08/26/2020: BUN 21; Creatinine, Ser 0.83; Potassium 4.7; Sodium 140   Recent Lipid Panel Lab Results  Component Value Date/Time   CHOL 149 11/27/2017 08:00 AM   TRIG 67 11/27/2017 08:00 AM   HDL 73 11/27/2017 08:00 AM   LDLCALC 63 11/27/2017 08:00 AM     Risk Assessment/Calculations:    {Does this patient have ATRIAL FIBRILLATION?:[id number]}      Physical Exam:    VS:  LMP 02/22/1983 (Approximate)     Wt Readings from Last 3 Encounters:  01/18/21 126 lb (57.2 kg)  11/24/20 125 lb (56.7 kg)  11/17/20 123 lb 6.4 oz (56 kg)    Physical Exam ***    Medication Adjustments/Labs and Tests Ordered: Current medicines are reviewed at length with the patient today.  Concerns regarding medicines are outlined above.  Tests Ordered: No orders of the defined types were placed in this encounter.  Medication Changes: No orders of the defined types were placed in this encounter.  Signed,  [name], PA-C  02/03/2021 12:23 PM    [facility] [place], [place], [place]  [address] Phone: [phone]; Fax: [phone]

## 2021-02-08 ENCOUNTER — Telehealth: Payer: Self-pay | Admitting: Cardiovascular Disease

## 2021-02-08 NOTE — Telephone Encounter (Signed)
Appointment scheduled for Nurse Visit for 02/09/2021.  Pt verbalized understanding and agrees with current plan.

## 2021-02-08 NOTE — Telephone Encounter (Signed)
Daughter calling in because she is was told her mom need to have ekg done asap. So she was calling to get that done but there is no ordered in for that. Please advise

## 2021-02-08 NOTE — Telephone Encounter (Signed)
Spoke with pt's daughter who states pt missed her appointment due to eye surgery on the same day as her 02/03/2021 appointment with [name], PA-C.  Appointment was scheduled for 04/2021 with PA.  Pt's daughter is asking if pt should be seen prior to this due to needing EKG for Aricept start.  Pt's daughter states pt remains with fatigue and SOB on exertion despite inhaler use.  Furosemide is on pt's medication list but she states this medication was stopped by this office.  Pt denies current CP or edema. Appointment scheduled with Dr Burt Knack, 02/24/2021 at 940am.  Pt's daughter verbalizes understanding and agrees with current plan.

## 2021-02-08 NOTE — Telephone Encounter (Signed)
Can we bring her in for a RN visit to get an EKG before her OV with Dr. Burt Knack? [name], PA-C    02/08/2021 3:42 PM

## 2021-02-09 ENCOUNTER — Other Ambulatory Visit: Payer: Self-pay

## 2021-02-09 ENCOUNTER — Ambulatory Visit (INDEPENDENT_AMBULATORY_CARE_PROVIDER_SITE_OTHER): Payer: Medicare Other

## 2021-02-09 VITALS — BP 139/69 | HR 60 | Wt 124.8 lb

## 2021-02-09 DIAGNOSIS — Z79899 Other long term (current) drug therapy: Secondary | ICD-10-CM

## 2021-02-09 DIAGNOSIS — I2510 Atherosclerotic heart disease of native coronary artery without angina pectoris: Secondary | ICD-10-CM | POA: Diagnosis not present

## 2021-02-09 DIAGNOSIS — I5032 Chronic diastolic (congestive) heart failure: Secondary | ICD-10-CM | POA: Diagnosis not present

## 2021-02-09 DIAGNOSIS — I480 Paroxysmal atrial fibrillation: Secondary | ICD-10-CM | POA: Diagnosis not present

## 2021-02-09 NOTE — Progress Notes (Signed)
Pt presents today for EKG at the request of [name], PA-C due to pt starting Aricept.  Pt will see Dr Burt Knack on 02/24/2021. Pt without complaints today.  She started Aricept about 3 weeks ago at the request of Neurology d/t memory issues.  Pt reports she is tolerating medication without any problems.  She reports she had eye surgery last week and was unable to keep her appointment with PA for follow up. EKG completed and taken to Dr Camnitz,DOD who states it is normal.  Will forward to [name] for review.  EKG placed with medical records for scan.

## 2021-02-09 NOTE — Patient Instructions (Signed)
Medication Instructions:  Your physician recommends that you continue on your current medications as directed. Please refer to the Current Medication list given to you today.  *If you need a refill on your cardiac medications before your next appointment, please call your pharmacy*   Lab Work: None ordered.  If you have labs (blood work) drawn today and your tests are completely normal, you will receive your results only by: [place] (if you have MyChart) OR A paper copy in the mail If you have any lab test that is abnormal or we need to change your treatment, we will call you to review the results.   Testing/Procedures: None ordered.    Follow-Up: At [facility], you and your health needs are our priority.  As part of our continuing mission to provide you with exceptional heart care, we have created designated Provider Care Teams.  These Care Teams include your primary Cardiologist (physician) and Advanced Practice Providers (APPs -  Physician Assistants and Nurse Practitioners) who all work together to provide you with the care you need, when you need it.  We recommend signing up for the patient portal called "MyChart".  Sign up information is provided on this After Visit Summary.  MyChart is used to connect with patients for Virtual Visits (Telemedicine).  Patients are able to view lab/test results, encounter notes, upcoming appointments, etc.  Non-urgent messages can be sent to your provider as well.   To learn more about what you can do with MyChart, go to [email or web address].    Your next appointment:   As scheduled with Dr Burt Knack

## 2021-02-09 NOTE — Progress Notes (Signed)
Agree. EKG reviewed and demonstrates normal sinus rhythm. [name], PA-C    02/09/2021 5:08 PM

## 2021-02-09 NOTE — Addendum Note (Signed)
Addended by: Thora Lance on: 02/09/2021 03:04 PM   Modules accepted: Level of Service

## 2021-02-12 ENCOUNTER — Ambulatory Visit (HOSPITAL_COMMUNITY): Payer: 59

## 2021-02-15 ENCOUNTER — Other Ambulatory Visit: Payer: Self-pay | Admitting: Neurology

## 2021-02-15 DIAGNOSIS — R4189 Other symptoms and signs involving cognitive functions and awareness: Secondary | ICD-10-CM

## 2021-02-15 DIAGNOSIS — F03A0 Unspecified dementia, mild, without behavioral disturbance, psychotic disturbance, mood disturbance, and anxiety: Secondary | ICD-10-CM

## 2021-02-15 DIAGNOSIS — G309 Alzheimer's disease, unspecified: Secondary | ICD-10-CM

## 2021-02-16 ENCOUNTER — Other Ambulatory Visit: Payer: Self-pay | Admitting: Neurology

## 2021-02-16 DIAGNOSIS — R4189 Other symptoms and signs involving cognitive functions and awareness: Secondary | ICD-10-CM

## 2021-02-16 DIAGNOSIS — F03A0 Unspecified dementia, mild, without behavioral disturbance, psychotic disturbance, mood disturbance, and anxiety: Secondary | ICD-10-CM

## 2021-02-16 DIAGNOSIS — G309 Alzheimer's disease, unspecified: Secondary | ICD-10-CM

## 2021-02-24 ENCOUNTER — Ambulatory Visit (INDEPENDENT_AMBULATORY_CARE_PROVIDER_SITE_OTHER): Payer: Medicare Other | Admitting: Cardiovascular Disease

## 2021-02-24 ENCOUNTER — Other Ambulatory Visit: Payer: Self-pay

## 2021-02-24 ENCOUNTER — Encounter: Payer: Self-pay | Admitting: Cardiovascular Disease

## 2021-02-24 VITALS — BP 110/70 | HR 64 | Ht 63.0 in | Wt 122.6 lb

## 2021-02-24 DIAGNOSIS — I480 Paroxysmal atrial fibrillation: Secondary | ICD-10-CM

## 2021-02-24 DIAGNOSIS — I2510 Atherosclerotic heart disease of native coronary artery without angina pectoris: Secondary | ICD-10-CM | POA: Diagnosis not present

## 2021-02-24 DIAGNOSIS — E782 Mixed hyperlipidemia: Secondary | ICD-10-CM | POA: Diagnosis not present

## 2021-02-24 NOTE — Progress Notes (Signed)
Cardiology Office Note:    Date:  02/24/2021   ID:  [name], DOB 1934/08/30, MRN [id number]  PCP:  [name], MD   [facility] Providers Cardiologist:  Sherren Mocha, MD Cardiology APP:  Sharmon Revere     Referring MD: [name], MD   Chief Complaint  Patient presents with   Coronary Artery Disease    History of Present Illness:    [name] is a 87 y.o. female with a hx of: Coronary artery disease Cath 3/17: oLCx 60-70 (neg by FFR) Myoview 11/16: no ischemia Myoview 07/2018: EF 73, prob low risk (significant extracardiac uptake), no ischemia Myoview 08/05/2020: Negative for ischemia Paroxysmal atrial fibrillation  CHA2DS2-VASc=5 (CAD, HTN, age x 2, female) >> Apixaban 2.5 mg (> 86 yo, wt <60 kg)  Pulmonary sarcoidosis Bronchiectasis Hypertension  Hyperlipidemia  SVT Carotid stenosis [place] 06/2018: bilat 1-39  The patient is here alone today. She has had some exertional dyspnea but reports no recent change. She underwent stress testing last year to evaluate her dyspnea and this showed no ischemia. She denies chest pain or pressure.  She was recently started on Aricept for mild cognitive impairment and feels like she is doing well in this regard.  She remains functionally independent and is having no problems driving at present.  Past Medical History:  Diagnosis Date   Allergy    SEASONAL   Anemia    Arthritis    Asthma    "slight"    Baker's cyst of knee, right    CAD (coronary artery disease)    a. Myoview 10/15 - normal EF 70% // Myoview 11/16: EF 75%, normal perfusion, low risk // c. LHC 3/17 - LAD irregs, oLCx 70 (neg FFR), mRCA 30, EF 55-65% >> med Rx // Myoview 07/2018:  EF 73, extracardiac uptake, no significant ischemia (reviewed with Dr. Burt Knack), Low Risk // Myoview 6/22: EF 72, normal perfusion, low risk   Carotid stenosis    a. Carotid US 9/15 - Bilateral 1-39% ICA >> FU 2 years // b. Bilateral ICA 1-39 >> FU prn //  Carotid US 06/2018: bilat 1-39; fu prn   Dyspnea    due to Sarcoidosis. When is windy or humed   Dysrhythmia    fast heart rate   Elbow fracture 04/2017   Right, had surgery   Esophageal reflux    Hematochezia    History of echocardiogram    a. Echo 10/15 - EF 60-65%, no RWMA   HLD (hyperlipidemia)    Hx of colonoscopy    Macular pucker, right eye    will have removed on 01/26/21   Osteoporosis    Other chronic pulmonary heart diseases    Paroxysmal supraventricular tachycardia ([place])    Pneumonia    Sarcoidosis     Past Surgical History:  Procedure Laterality Date   arm surgery Right    BLADDER SURGERY     CARDIAC CATHETERIZATION N/A 05/20/2015   Procedure: Left Heart Cath and Coronary Angiography;  Surgeon: Sherren Mocha, MD;  Location: [facility];  Service: Cardiovascular;  Laterality: N/A;   CARPAL TUNNEL RELEASE Left    CATARACT EXTRACTION Right 07/2020   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   COLONOSCOPY     CYSTOSCOPY W/ RETROGRADES Left 07/30/2019   Procedure: CYSTOSCOPY WITH RETROGRADE PYELOGRAM LEFT STENT PLACEMENT;  Surgeon: Ardis Hughs, MD;  Location: [facility];  Service: Urology;  Laterality: Left;   CYSTOSCOPY WITH RETROGRADE PYELOGRAM, URETEROSCOPY AND STENT PLACEMENT Left  08/20/2019   Procedure: CYSTOSCOPY, URETEROSCOPY AND STENT PLACEMENT;  Surgeon: Robley Fries, MD;  Location: [facility];  Service: Urology;  Laterality: Left;  1 HR   FOOT SURGERY Right    HIP SURGERY Right 2011   full replacement   HOLMIUM LASER APPLICATION Left 32/95/1884   Procedure: HOLMIUM LASER APPLICATION;  Surgeon: Robley Fries, MD;  Location: [facility];  Service: Urology;  Laterality: Left;   LEG SURGERY Left 06/2012   femur fracture s/p open and closed reduction in [place], Dr. Jimmye Norman   lens replacement Right    right eye   ORIF ELBOW FRACTURE Right 05/09/2017   Procedure: ORIF right olecranon fracture with repair/reconstruction, ulnar nerve transposition as needed;   Surgeon: Roseanne Kaufman, MD;  Location: [facility];  Service: Orthopedics;  Laterality: Right;  Requests 90 mins   pelvis fracture     POLYPECTOMY     REVERSE SHOULDER ARTHROPLASTY Left 06/13/2019   Procedure: REVERSE SHOULDER ARTHROPLASTY;  Surgeon: Justice Britain, MD;  Location: [facility];  Service: Orthopedics;  Laterality: Left;  131min   SHOULDER ARTHROSCOPY W/ ROTATOR CUFF REPAIR Right    TRAPEZIUM RESECTION Right     Current Medications: Current Meds  Medication Sig   acetaminophen (TYLENOL) 500 MG tablet Take 1,000 mg by mouth every 6 (six) hours as needed for mild pain.    albuterol (VENTOLIN HFA) 108 (90 Base) MCG/ACT inhaler Inhale 2 puffs into the lungs every 4 (four) hours as needed for wheezing or shortness of breath.   apixaban (ELIQUIS) 2.5 MG TABS tablet Take 1 tablet (2.5 mg total) by mouth 2 (two) times daily.   calcium carbonate (TUMS - DOSED IN MG ELEMENTAL CALCIUM) 500 MG chewable tablet Chew 1 tablet by mouth daily as needed for indigestion.    cetirizine (ZYRTEC) 10 MG tablet Take 1 tablet (10 mg total) by mouth daily.   denosumab (PROLIA) 60 MG/ML SOLN injection Inject 60 mg into the skin every 6 (six) months. Administer in upper arm, thigh, or abdomen   dextromethorphan (DELSYM) 30 MG/5ML liquid Take 30 mg by mouth 2 (two) times daily as needed for cough.   diltiazem (CARDIZEM CD) 180 MG 24 hr capsule Take 180 mg by mouth at bedtime.    docusate sodium (COLACE) 50 MG capsule Take 100 mg by mouth at bedtime.    donepezil (ARICEPT) 5 MG tablet TAKE 1 TABLET(5 MG) BY MOUTH AT BEDTIME   fluticasone (FLONASE) 50 MCG/ACT nasal spray Place 2 sprays into both nostrils 2 (two) times daily.   furosemide (LASIX) 20 MG tablet Take 1 tablet (20 mg total) by mouth daily.   ipratropium (ATROVENT HFA) 17 MCG/ACT inhaler Inhale 1 puff into the lungs 2 (two) times daily.   meloxicam (MOBIC) 15 MG tablet as needed.   nitroGLYCERIN (NITROSTAT) 0.4 MG SL tablet Place 1 tablet (0.4 mg total)  under the tongue every 5 (five) minutes as needed for chest pain.   omeprazole (PRILOSEC) 40 MG capsule Take 40 mg by mouth 2 (two) times daily.    ondansetron (ZOFRAN-ODT) 4 MG disintegrating tablet Take 4 mg by mouth every 4 (four) hours as needed for nausea or vomiting.    oxybutynin (DITROPAN) 5 MG tablet Take 5 mg by mouth at bedtime.   oxyCODONE-acetaminophen (PERCOCET) 5-325 MG tablet Take 1 tablet by mouth every 4 (four) hours as needed (max 6 q for post op pain).   polyethylene glycol (MIRALAX / GLYCOLAX) 17 g packet Take 17 g by mouth daily.  rosuvastatin (CRESTOR) 10 MG tablet Take 1 tablet (10 mg total) by mouth 3 (three) times a week. Monday, Wednesday and Friday.   traMADol (ULTRAM) 50 MG tablet Take 1 tablet po q 8 hours prn-caution sedation   Vitamin D, Ergocalciferol, (DRISDOL) 50000 units CAPS capsule Take 50,000 Units by mouth every Friday.      Allergies:   Levaquin [levofloxacin], Sulfonamide derivatives, Oxycodone, and Morphine   Social History   Socioeconomic History   Marital status: Widowed    Spouse name: Not on file   Number of children: 1   Years of education: Not on file   Highest education level: Some college, no degree  Occupational History   Occupation: retired    Comment: still works as Scientist, 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  Tobacco Use   Smoking status: Never   Smokeless tobacco: Never  Vaping Use   Vaping Use: Never used  Substance and Sexual Activity   Alcohol use: No   Drug use: No   Sexual activity: Not Currently    Partners: Male    Birth control/protection: Post-menopausal  Other Topics Concern   Not on file  Social History Narrative   Lives with daughter and son in law   decaffeinated  use:  Coffee 1 per day   Right handed   Social Determinants of Health   Financial Resource Strain: Not on file  Food Insecurity: Not on file  Transportation Needs: Not on file  Physical Activity: Not on file  Stress: Not on file  Social  Connections: Not on file     Family History: The patient's family history includes Arrhythmia in her father; Cancer in her father, mother, and sister; Colon cancer (age of onset: 48) in her mother; Heart attack in her brother; Heart disease in her father; Lung cancer in her father. There is no history of Esophageal cancer, Rectal cancer, Stomach cancer, or Stroke.  ROS:   Please see the history of present illness.    All other systems reviewed and are negative.  EKGs/Labs/Other Studies Reviewed:    The following studies were reviewed today: Myoview scan 08/05/2020: The left ventricular ejection fraction is hyperdynamic (>65%). Nuclear stress EF: 72%. There was no ST segment deviation noted during stress. No T wave inversion was noted during stress. The study is normal. This is a low risk study.  Echocardiogram 09/23/2019:  1. Left ventricular ejection fraction, by estimation, is 55 to 60%. The  left ventricle has normal function. The left ventricle has no regional  wall motion abnormalities. Left ventricular diastolic parameters are  consistent with Grade I diastolic  dysfunction (impaired relaxation).   2. Right ventricular systolic function is normal. The right ventricular  size is normal. There is normal pulmonary artery systolic pressure. The  estimated right ventricular systolic pressure is 03.5 mmHg.   3. The mitral valve is degenerative. Trivial mitral valve regurgitation.  No evidence of mitral stenosis.   4. The aortic valve is grossly normal. Aortic valve regurgitation is not  visualized. No aortic stenosis is present.   5. The inferior vena cava is normal in size with greater than 50%  respiratory variability, suggesting right atrial pressure of 3 mmHg.  EKG:  EKG is ordered today.  The ekg ordered today demonstrates normal sinus rhythm 64 bpm, nonspecific ST abnormality.  Recent Labs: 07/21/2020: ALT 8; Hemoglobin 12.7; Platelets 309 07/29/2020: NT-Pro BNP 836 08/26/2020:  BUN 21; Creatinine, Ser 0.83; Potassium 4.7; Sodium 140  Recent Lipid Panel  Component Value Date/Time   CHOL 149 11/27/2017 0800   TRIG 67 11/27/2017 0800   HDL 73 11/27/2017 0800   CHOLHDL 2.0 11/27/2017 0800   CHOLHDL 2.0 11/03/2015 0742   VLDL 14 11/03/2015 0742   LDLCALC 63 11/27/2017 0800     Risk Assessment/Calculations:    CHA2DS2-VASc Score = 5   This indicates a 7.2% annual risk of stroke. The patient's score is based upon: CHF History: 0 HTN History: 1 Diabetes History: 0 Stroke History: 0 Vascular Disease History: 1 Age Score: 2 Gender Score: 1          Physical Exam:    VS:  BP 110/70    Pulse 64    Ht 5\' 3"  (1.6 m)    Wt 122 lb 9.6 oz (55.6 kg)    LMP 02/22/1983 (Approximate)    SpO2 97%    BMI 21.72 kg/m     Wt Readings from Last 3 Encounters:  02/24/21 122 lb 9.6 oz (55.6 kg)  02/09/21 124 lb 12.8 oz (56.6 kg)  01/18/21 126 lb (57.2 kg)     GEN:  Well nourished, well developed in no acute distress HEENT: Normal NECK: No JVD; No carotid bruits LYMPHATICS: No lymphadenopathy CARDIAC: RRR, no murmurs, rubs, gallops RESPIRATORY:  Clear to auscultation without rales, wheezing or rhonchi  ABDOMEN: Soft, non-tender, non-distended MUSCULOSKELETAL:  No edema; No deformity  SKIN: Warm and dry NEUROLOGIC:  Alert and oriented x 3 PSYCHIATRIC:  Normal affect   ASSESSMENT:    1. Paroxysmal atrial fibrillation (HCC)   2. Coronary artery disease involving native coronary artery of native heart without angina pectoris   3. Mixed hyperlipidemia    PLAN:    In order of problems listed above:  Doing well at present without symptoms, appropriately anticoagulated with apixaban and denies bleeding issues.  Labs from May 2022 show a hemoglobin of 12.7, creatinine 0.83 in July 2022. EKG ok with no AV block after starting Aricept.  Doing well at present, no anginal symptoms.  Continue current medical program. Treated with rosuvastatin.  Last LDL cholesterol  was 122 mg/dL.  Considering her advanced age and medical issues, I am inclined to keep her on her current treatment with rosuvastatin.   Medication Adjustments/Labs and Tests Ordered: Current medicines are reviewed at length with the patient today.  Concerns regarding medicines are outlined above.  Orders Placed This Encounter  Procedures   EKG 12-Lead   No orders of the defined types were placed in this encounter.   Patient Instructions  Medication Instructions:  Your physician recommends that you continue on your current medications as directed. Please refer to the Current Medication list given to you today.  *If you need a refill on your cardiac medications before your next appointment, please call your pharmacy*  Follow-Up: At [facility], you and your health needs are our priority.  As part of our continuing mission to provide you with exceptional heart care, we have created designated Provider Care Teams.  These Care Teams include your primary Cardiologist (physician) and Advanced Practice Providers (APPs -  Physician Assistants and Nurse Practitioners) who all work together to provide you with the care you need, when you need it.   Your next appointment:   6 month(s)  The format for your next appointment:   In Person  Provider:   [name], PA-C      Then, Sherren Mocha, MD will plan to see you again in 1 year(s).     Signed,  Sherren Mocha, MD  02/24/2021 1:40 PM    [facility]

## 2021-02-24 NOTE — Patient Instructions (Signed)
Medication Instructions:  Your physician recommends that you continue on your current medications as directed. Please refer to the Current Medication list given to you today.  *If you need a refill on your cardiac medications before your next appointment, please call your pharmacy*  Follow-Up: At [facility], you and your health needs are our priority.  As part of our continuing mission to provide you with exceptional heart care, we have created designated Provider Care Teams.  These Care Teams include your primary Cardiologist (physician) and Advanced Practice Providers (APPs -  Physician Assistants and Nurse Practitioners) who all work together to provide you with the care you need, when you need it.   Your next appointment:   6 month(s)  The format for your next appointment:   In Person  Provider:   [name], PA-C      Then, Sherren Mocha, MD will plan to see you again in 1 year(s).

## 2021-02-25 ENCOUNTER — Other Ambulatory Visit: Payer: Self-pay | Admitting: Neurology

## 2021-02-25 ENCOUNTER — Ambulatory Visit (HOSPITAL_COMMUNITY)
Admission: RE | Admit: 2021-02-25 | Discharge: 2021-02-25 | Disposition: A | Discharge status: Home / Self Care | Payer: Medicare Other | Source: Ambulatory Visit | Attending: Neurology | Admitting: Neurology

## 2021-02-25 DIAGNOSIS — F0280 Dementia in other diseases classified elsewhere without behavioral disturbance: Secondary | ICD-10-CM | POA: Diagnosis not present

## 2021-02-25 DIAGNOSIS — R4189 Other symptoms and signs involving cognitive functions and awareness: Secondary | ICD-10-CM | POA: Diagnosis present

## 2021-02-25 DIAGNOSIS — G309 Alzheimer's disease, unspecified: Secondary | ICD-10-CM | POA: Insufficient documentation

## 2021-02-25 DIAGNOSIS — F03A0 Unspecified dementia, mild, without behavioral disturbance, psychotic disturbance, mood disturbance, and anxiety: Secondary | ICD-10-CM

## 2021-02-25 LAB — GLUCOSE, CAPILLARY: Glucose-Capillary: 104 mg/dL — ABNORMAL HIGH (ref 70–99)

## 2021-02-25 MED ORDER — FLUDEOXYGLUCOSE F - 18 (FDG) INJECTION
9.2000 | Freq: Once | INTRAVENOUS | Status: AC
  Administered 2021-02-25: 9.2 via INTRAVENOUS

## 2021-03-04 ENCOUNTER — Encounter: Payer: Self-pay | Admitting: Neurology

## 2021-03-17 ENCOUNTER — Telehealth: Payer: Self-pay | Admitting: *Deleted

## 2021-03-17 ENCOUNTER — Other Ambulatory Visit: Payer: Self-pay

## 2021-03-17 ENCOUNTER — Encounter: Payer: Self-pay | Admitting: Obstetrics and Gynecology

## 2021-03-17 ENCOUNTER — Ambulatory Visit (INDEPENDENT_AMBULATORY_CARE_PROVIDER_SITE_OTHER): Payer: Medicare Other | Admitting: Obstetrics and Gynecology

## 2021-03-17 VITALS — BP 110/60 | HR 72 | Wt 121.0 lb

## 2021-03-17 DIAGNOSIS — I2510 Atherosclerotic heart disease of native coronary artery without angina pectoris: Secondary | ICD-10-CM | POA: Diagnosis not present

## 2021-03-17 DIAGNOSIS — L0231 Cutaneous abscess of buttock: Secondary | ICD-10-CM | POA: Diagnosis not present

## 2021-03-17 MED ORDER — AMOXICILLIN-POT CLAVULANATE 875-125 MG PO TABS: 1.0000 | ORAL_TABLET | Freq: Two times a day (BID) | ORAL | 0 refills | Status: DC

## 2021-03-17 NOTE — Patient Instructions (Signed)
Skin Abscess  A skin abscess is an infected area on or under your skin that contains a collection of pus and other material. An abscess may also be called a furuncle,carbuncle, or boil. An abscess can occur in or on almost any part of your body. Some abscesses break open (rupture) on their own. Most continue to get worse unless they are treated. The infection can spread deeper into the body and eventually into your blood, whichcan make you feel ill. Treatment usually involves draining the abscess. What are the causes? An abscess occurs when germs, like bacteria, pass through your skin and cause an infection. This may be caused by: A scrape or cut on your skin. A puncture wound through your skin, including a needle injection or insect bite. Blocked oil or sweat glands. Blocked and infected hair follicles. A cyst that forms beneath your skin (sebaceous cyst) and becomes infected. What increases the risk? This condition is more likely to develop in people who: Have a weak body defense system (immune system). Have diabetes. Have dry and irritated skin. Get frequent injections or use illegal IV drugs. Have a foreign body in a wound, such as a splinter. Have problems with their lymph system or veins. What are the signs or symptoms? Symptoms of this condition include: A painful, firm bump under the skin. A bump with pus at the top. This may break through the skin and drain. Other symptoms include: Redness surrounding the abscess site. Warmth. Swelling of the lymph nodes (glands) near the abscess. Tenderness. A sore on the skin. How is this diagnosed? This condition may be diagnosed based on: A physical exam. Your medical history. A sample of pus. This may be used to find out what is causing the infection. Blood tests. Imaging tests, such as an ultrasound, CT scan, or MRI. How is this treated? A small abscess that drains on its own may not need treatment. Treatment for larger abscesses  may include: Moist heat or heat pack applied to the area several times a day. A procedure to drain the abscess (incision and drainage). Antibiotic medicines. For a severe abscess, you may first get antibiotics through an IV and then change to antibiotics by mouth. Follow these instructions at home: Medicines  Take over-the-counter and prescription medicines only as told by your health care provider. If you were prescribed an antibiotic medicine, take it as told by your health care provider. Do not stop taking the antibiotic even if you start to feel better.  Abscess care  If you have an abscess that has not drained, apply heat to the affected area. Use the heat source that your health care provider recommends, such as a moist heat pack or a heating pad. Place a towel between your skin and the heat source. Leave the heat on for 20-30 minutes. Remove the heat if your skin turns bright red. This is especially important if you are unable to feel pain, heat, or cold. You may have a greater risk of getting burned. Follow instructions from your health care provider about how to take care of your abscess. Make sure you: Cover the abscess with a bandage (dressing). Change your dressing or gauze as told by your health care provider. Wash your hands with soap and water before you change the dressing or gauze. If soap and water are not available, use hand sanitizer. Check your abscess every day for signs of a worsening infection. Check for: More redness, swelling, or pain. More fluid or blood. Warmth. More   pus or a bad smell.  General instructions To avoid spreading the infection: Do not share personal care items, towels, or hot tubs with others. Avoid making skin contact with other people. Keep all follow-up visits as told by your health care provider. This is important. Contact a health care provider if you have: More redness, swelling, or pain around your abscess. More fluid or blood coming  from your abscess. Warm skin around your abscess. More pus or a bad smell coming from your abscess. A fever. Muscle aches. Chills or a general ill feeling. Get help right away if you: Have severe pain. See red streaks on your skin spreading away from the abscess. Summary A skin abscess is an infected area on or under your skin that contains a collection of pus and other material. A small abscess that drains on its own may not need treatment. Treatment for larger abscesses may include having a procedure to drain the abscess and taking an antibiotic. This information is not intended to replace advice given to you by your health care provider. Make sure you discuss any questions you have with your healthcare provider. Document Revised: 05/31/2018 Document Reviewed: 03/23/2017 Elsevier Patient Education  2022 Elsevier Inc.  

## 2021-03-17 NOTE — Telephone Encounter (Signed)
Please add her in at 2:45

## 2021-03-17 NOTE — Telephone Encounter (Signed)
Patient informed to arrive at 2:30 pm. Asked [name] to add her to schedule.

## 2021-03-17 NOTE — Progress Notes (Signed)
GYNECOLOGY  VISIT   HPI: 87 y.o.   Widowed White or Caucasian Not Hispanic or Latino  female   [id number] with Patient's last menstrual period was 02/22/1983 (approximate).   here for abscess on her buttock. She states that she has a little pain with it.     8/22 she was seen with a 1.5 cm abscess on the upper, inner, left buttock. Treated with antibiotics. In 11/04/20 that same area was bothersome again, spontaneously draining. Culture grew out proteus mirabilis, treated with cipro 11/17/20 the same abscess was drained 11/24/20 She presented for f/u and was noted to be healing well.   The abscess completely went away. About a week ago she noticed a non tender lump in the same area. Yesterday it started to hurt. She hasn't tried hot soaks or warm compresses yet.   GYNECOLOGIC HISTORY: Patient's last menstrual period was 02/22/1983 (approximate). Contraception:pmp         OB History     Gravida  1   Para  1   Term  1   Preterm  0   AB  0   Living  1      SAB  0   IAB  0   Ectopic  0   Multiple  0   Live Births  1              Patient Active Problem List   Diagnosis Date Noted   Chronic heart failure with preserved ejection fraction ([place]) 02/03/2021   Cognitive decline 01/18/2021   Right knee pain 06/08/2020   Age-related osteoporosis without current pathological fracture 05/01/2020   Hyperglycemia 05/01/2020   Impacted cerumen 05/01/2020   Nasal congestion 05/01/2020   Personal history of COVID-19 05/01/2020   Bilateral hearing loss 11/15/2019   Post-nasal drainage 11/15/2019   COPD (chronic obstructive pulmonary disease) ([place]) 10/30/2019   Edema 10/30/2019   Atrial fibrillation ([place])    Aspiration pneumonia ([place]) 09/19/2019   Pneumonia 09/18/2019   Sepsis ([place]) 07/30/2019   Pyelonephritis 07/29/2019   Hydronephrosis 07/29/2019   Adhesive capsulitis of right shoulder 07/29/2019   Iron deficiency anemia 46/96/2952   Metabolic encephalopathy 84/13/2440    Sepsis due to Escherichia coli (e. coli) ([place]) 07/29/2019   Sepsis due to urinary tract infection ([place]) 07/18/2019   Leukocytosis 07/18/2019   Acute pain of left wrist 07/11/2019   S/P reverse total shoulder arthroplasty, 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   Pre-op evaluation 06/11/2019   Rotator cuff arthropathy of left shoulder 05/03/2019   Shoulder pain, left 04/29/2019   Low back pain 03/13/2019   Fracture of inferior pubic ramus ([place]) 02/28/2019   Degeneration of lumbar intervertebral disc 02/14/2019   Acute renal failure syndrome ([place]) 01/04/2019   Sacral insufficiency fracture 12/12/2018   Hip fracture ([place]) 12/12/2018   Amnesia 02/06/2018   ETD (Eustachian tube dysfunction), bilateral 11/07/2017   Chronic nonintractable headache 11/07/2017   Acute maxillary sinusitis 10/17/2017   Otalgia 10/17/2017   Aftercare 06/06/2017   Closed fracture of right olecranon process 05/09/2017   Pain in joint of right elbow 05/02/2017   Rib lesion 04/20/2017   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   Bronchiectasis without complication ([place]) 12/18/2534   Atopic dermatitis 04/08/2016   Skin sensation disturbance 03/08/2016   Weakness 12/22/2015   Allergic rhinitis 09/15/2015   CAD (coronary artery disease) 05/18/2015   Essential hypertension 05/18/2015   Carotid artery disease ([place]) 05/18/2015   Cough 03/19/2015   Left foot pain 09/02/2014   DOE (dyspnea  on exertion) 12/26/2013   Abnormal gait 11/06/2013   Scoliosis (and kyphoscoliosis), idiopathic 07/30/2013   Acquired unequal leg length on left 04/11/2013   Disorder of lung 11/23/2012   History of fracture 10/18/2012   Palpitations 10/18/2012   Constipation 09/05/2012   History of sinus tachycardia 09/05/2012   Closed left subtrochanteric femur fracture S/P Open/closed and reduction, internal medullary fixation  09/05/2012   Acute posthemorrhagic anemia 08/22/2012   Spasm of back muscles 03/12/2012   Lumbar radiculopathy  11/23/2011   Leg weakness 11/23/2011   Mitral regurgitation 11/22/2011   Low compliance bladder 04/18/2011   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   Carpal tunnel syndrome 02/23/2009   Cardiovascular symptoms 02/23/2009   Vitamin D deficiency 02/23/2009   DIARRHEA-PRESUMED INFECTIOUS 08/18/2008   RECTAL BLEEDING 08/18/2008   PERSONAL HX COLONIC POLYPS 08/18/2008   DEGENERATIVE JOINT DISEASE 08/14/2008   SKIN CANCER, HX OF 08/14/2008   CARPAL TUNNEL SYNDROME, HX OF 08/14/2008   SARCOIDOSIS, PULMONARY 08/08/2008   HLD (hyperlipidemia) 08/08/2008   Paroxysmal supraventricular tachycardia ([place]) 08/08/2008   GERD 08/08/2008    Past Medical History:  Diagnosis Date   Allergy    SEASONAL   Anemia    Arthritis    Asthma    "slight"    Baker's cyst of knee, right    CAD (coronary artery disease)    a. Myoview 10/15 - normal EF 70% // Myoview 11/16: EF 75%, normal perfusion, low risk // c. LHC 3/17 - LAD irregs, oLCx 70 (neg FFR), mRCA 30, EF 55-65% >> med Rx // Myoview 07/2018:  EF 73, extracardiac uptake, no significant ischemia (reviewed with Dr. Burt Knack), Low Risk // Myoview 6/22: EF 72, normal perfusion, low risk   Carotid stenosis    a. Carotid US 9/15 - Bilateral 1-39% ICA >> FU 2 years // b. Bilateral ICA 1-39 >> FU prn // Carotid US 06/2018: bilat 1-39; fu prn   Dyspnea    due to Sarcoidosis. When is windy or humed   Dysrhythmia    fast heart rate   Elbow fracture 04/2017   Right, had surgery   Esophageal reflux    Hematochezia    History of echocardiogram    a. Echo 10/15 - EF 60-65%, no RWMA   HLD (hyperlipidemia)    Hx of colonoscopy    Macular pucker, right eye    will have removed on 01/26/21   Osteoporosis    Other chronic pulmonary heart diseases    Paroxysmal supraventricular tachycardia ([place])    Pneumonia    Sarcoidosis     Past Surgical History:  Procedure Laterality Date   arm surgery Right    BLADDER SURGERY     CARDIAC CATHETERIZATION N/A 05/20/2015    Procedure: Left Heart Cath and Coronary Angiography;  Surgeon: Sherren Mocha, MD;  Location: [facility];  Service: Cardiovascular;  Laterality: N/A;   CARPAL TUNNEL RELEASE Left    CATARACT EXTRACTION Right 07/2020   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   COLONOSCOPY     CYSTOSCOPY W/ RETROGRADES Left 07/30/2019   Procedure: CYSTOSCOPY WITH RETROGRADE PYELOGRAM LEFT STENT PLACEMENT;  Surgeon: Ardis Hughs, MD;  Location: [facility];  Service: Urology;  Laterality: Left;   CYSTOSCOPY WITH RETROGRADE PYELOGRAM, URETEROSCOPY AND STENT PLACEMENT Left 08/20/2019   Procedure: CYSTOSCOPY, URETEROSCOPY AND STENT PLACEMENT;  Surgeon: Robley Fries, MD;  Location: [facility];  Service: Urology;  Laterality: Left;  1 HR   FOOT SURGERY Right    HIP SURGERY Right 2011  full replacement   HOLMIUM LASER APPLICATION Left 86/76/1950   Procedure: HOLMIUM LASER APPLICATION;  Surgeon: Robley Fries, MD;  Location: [facility];  Service: Urology;  Laterality: Left;   LEG SURGERY Left 06/2012   femur fracture s/p open and closed reduction in [place], Dr. Jimmye Norman   lens replacement Right    right eye   ORIF ELBOW FRACTURE Right 05/09/2017   Procedure: ORIF right olecranon fracture with repair/reconstruction, ulnar nerve transposition as needed;  Surgeon: Roseanne Kaufman, MD;  Location: [facility];  Service: Orthopedics;  Laterality: Right;  Requests 90 mins   pelvis fracture     POLYPECTOMY     REVERSE SHOULDER ARTHROPLASTY Left 06/13/2019   Procedure: REVERSE SHOULDER ARTHROPLASTY;  Surgeon: Justice Britain, MD;  Location: [facility];  Service: Orthopedics;  Laterality: Left;  151min   SHOULDER ARTHROSCOPY W/ ROTATOR CUFF REPAIR Right    TRAPEZIUM RESECTION Right     Current Outpatient Medications  Medication Sig Dispense Refill   acetaminophen (TYLENOL) 500 MG tablet Take 1,000 mg by mouth every 6 (six) hours as needed for mild pain.      albuterol (VENTOLIN HFA) 108 (90 Base) MCG/ACT inhaler Inhale 2 puffs into  the lungs every 4 (four) hours as needed for wheezing or shortness of breath. 6.7 g 1   calcium carbonate (TUMS - DOSED IN MG ELEMENTAL CALCIUM) 500 MG chewable tablet Chew 1 tablet by mouth daily as needed for indigestion.      cetirizine (ZYRTEC) 10 MG tablet Take 1 tablet (10 mg total) by mouth daily. 30 tablet 1   denosumab (PROLIA) 60 MG/ML SOLN injection Inject 60 mg into the skin every 6 (six) months. Administer in upper arm, thigh, or abdomen     dextromethorphan (DELSYM) 30 MG/5ML liquid Take 30 mg by mouth 2 (two) times daily as needed for cough.     diltiazem (CARDIZEM CD) 180 MG 24 hr capsule Take 180 mg by mouth at bedtime.      docusate sodium (COLACE) 50 MG capsule Take 100 mg by mouth at bedtime.      donepezil (ARICEPT) 5 MG tablet TAKE 1 TABLET(5 MG) BY MOUTH AT BEDTIME 30 tablet 0   fluticasone (FLONASE) 50 MCG/ACT nasal spray Place 2 sprays into both nostrils 2 (two) times daily. 16 g 1   furosemide (LASIX) 20 MG tablet Take 1 tablet (20 mg total) by mouth daily. 90 tablet 3   meloxicam (MOBIC) 15 MG tablet as needed.     nitroGLYCERIN (NITROSTAT) 0.4 MG SL tablet Place 1 tablet (0.4 mg total) under the tongue every 5 (five) minutes as needed for chest pain. 25 tablet 3   omeprazole (PRILOSEC) 40 MG capsule Take 40 mg by mouth 2 (two) times daily.      ondansetron (ZOFRAN-ODT) 4 MG disintegrating tablet Take 4 mg by mouth every 4 (four) hours as needed for nausea or vomiting.      oxybutynin (DITROPAN) 5 MG tablet Take 5 mg by mouth at bedtime.     oxyCODONE-acetaminophen (PERCOCET) 5-325 MG tablet Take 1 tablet by mouth every 4 (four) hours as needed (max 6 q for post op pain). 30 tablet 0   polyethylene glycol (MIRALAX / GLYCOLAX) 17 g packet Take 17 g by mouth daily.     rosuvastatin (CRESTOR) 10 MG tablet Take 1 tablet (10 mg total) by mouth 3 (three) times a week. Monday, Wednesday and Friday. 45 tablet 2   traMADol (ULTRAM) 50 MG tablet Take 1 tablet  po q 8 hours  prn-caution sedation     Vitamin D, Ergocalciferol, (DRISDOL) 50000 units CAPS capsule Take 50,000 Units by mouth every Friday.      apixaban (ELIQUIS) 2.5 MG TABS tablet Take 1 tablet (2.5 mg total) by mouth 2 (two) times daily. 180 tablet 1   ipratropium (ATROVENT HFA) 17 MCG/ACT inhaler Inhale 1 puff into the lungs 2 (two) times daily. 1 Inhaler 2   No current facility-administered medications for this visit.     ALLERGIES: Levaquin [levofloxacin], Sulfonamide derivatives, Oxycodone, and Morphine  Family History  Problem Relation Age of Onset   Colon cancer Mother 33   Cancer Mother    Lung cancer Father    Heart disease Father    Arrhythmia Father    Cancer Father    Cancer Sister        ovarian   Heart attack Brother    Esophageal cancer Neg Hx    Rectal cancer Neg Hx    Stomach cancer Neg Hx    Stroke Neg Hx     Social History   Socioeconomic History   Marital status: Widowed    Spouse name: Not on file   Number of children: 1   Years of education: Not on file   Highest education level: Some college, no degree  Occupational History   Occupation: retired    Comment: still works as Scientist, 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  Tobacco Use   Smoking status: Never   Smokeless tobacco: Never  Vaping Use   Vaping Use: Never used  Substance and Sexual Activity   Alcohol use: No   Drug use: No   Sexual activity: Not Currently    Partners: Male    Birth control/protection: Post-menopausal  Other Topics Concern   Not on file  Social History Narrative   Lives with daughter and son in law   decaffeinated  use:  Coffee 1 per day   Right handed   Social Determinants of Health   Financial Resource Strain: Not on file  Food Insecurity: Not on file  Transportation Needs: Not on file  Physical Activity: Not on file  Stress: Not on file  Social Connections: Not on file  Intimate Partner Violence: Not on file    Review of Systems  All other systems reviewed and are  negative.  PHYSICAL EXAMINATION:    BP 110/60    Pulse 72    Wt 121 lb (54.9 kg)    LMP 02/22/1983 (Approximate)    SpO2 100%    BMI 21.43 kg/m     General appearance: alert, cooperative and appears stated age  Pelvic: External genitalia:  no lesions              Urethra:  normal appearing urethra with no masses, tenderness or lesions              Bartholins and Skenes: normal                 There is an ~ 1 cm abscess on the upper, inner, left buttock. Not coming to a head, minimal erythema  Chaperone was present for exam.   1. Cutaneous abscess of buttock She had a difficult time with an abscess in the same region in the fall.  -Warm compresses and hot soaks -Given her recurrent abscess will treat with antibiotics.  - amoxicillin-clavulanate (AUGMENTIN) 875-125 MG tablet; Take 1 tablet by mouth 2 (two) times daily.  Dispense: 10 tablet; Refill: 0

## 2021-03-17 NOTE — Telephone Encounter (Signed)
Patient called and c/o cutaneous abscess of buttocks that appeared yesterday again. Patient spoke with appointments and offered her the 11:30am appointment,but patient could not make it at that time (lives in [place]). Patient said the area mainly hurts when wiping after using the bathroom. Do want to work in today this pm or another day? She can come this pm if you approve this.Please advise

## 2021-03-24 ENCOUNTER — Other Ambulatory Visit: Payer: Self-pay | Admitting: Neurology

## 2021-03-24 DIAGNOSIS — R4189 Other symptoms and signs involving cognitive functions and awareness: Secondary | ICD-10-CM

## 2021-03-24 DIAGNOSIS — G309 Alzheimer's disease, unspecified: Secondary | ICD-10-CM

## 2021-03-24 DIAGNOSIS — F03A0 Unspecified dementia, mild, without behavioral disturbance, psychotic disturbance, mood disturbance, and anxiety: Secondary | ICD-10-CM

## 2021-04-13 ENCOUNTER — Encounter: Payer: Medicare Other | Admitting: Psychology

## 2021-04-21 ENCOUNTER — Other Ambulatory Visit: Payer: Self-pay | Admitting: Cardiovascular Disease

## 2021-04-21 DIAGNOSIS — I480 Paroxysmal atrial fibrillation: Secondary | ICD-10-CM

## 2021-04-21 NOTE — Telephone Encounter (Signed)
Prescription refill request for Eliquis received. ?Indication: Afib  ?Last office visit:02/24/21 Burt Knack)  ?Scr: 0.8 (09/09/20)  ?Age: 87 ?Weight: 54.9kg ? ?Appropriate dose and refill sent to requested pharmacy.  ?

## 2021-05-03 ENCOUNTER — Ambulatory Visit: Payer: 59 | Admitting: Physician Assistant

## 2021-05-19 ENCOUNTER — Telehealth: Payer: Self-pay | Admitting: *Deleted

## 2021-05-19 DIAGNOSIS — G309 Alzheimer's disease, unspecified: Secondary | ICD-10-CM

## 2021-05-19 DIAGNOSIS — R4189 Other symptoms and signs involving cognitive functions and awareness: Secondary | ICD-10-CM

## 2021-05-19 DIAGNOSIS — F03A0 Unspecified dementia, mild, without behavioral disturbance, psychotic disturbance, mood disturbance, and anxiety: Secondary | ICD-10-CM

## 2021-05-19 NOTE — Telephone Encounter (Signed)
Patient's daughter called.  She wanted to follow-up to see if Dr. Jaynee Eagles was able to refer to a new neuropsych doctor that could see the patient sooner than September, which is when her appointment is scheduled with Dr. Sima Matas.  She stated the patient's memory has gotten worse.  She got lost Monday while driving to her dentist she has seen for years.  She states the patient has repeated questions multiple times during our conversation.  We did discuss that this recent worsening could potentially be related to infection or other metabolic issues.  I encouraged her to take patient to the primary care doctor.  She stated the patient has a history of UTI x2 and did not have typical symptoms.  She ended up with sepsis in the hospital.  She will follow-up with primary care.  She also asked if an MRI or CT would be needed?  She worried about brain tumor possibly.  I told her a message would be sent to Dr Jaynee Eagles. ?

## 2021-05-24 ENCOUNTER — Telehealth: Payer: Self-pay | Admitting: Neurology

## 2021-05-24 NOTE — Addendum Note (Signed)
Addended by: Brandon Melnick on: 05/24/2021 03:09 PM ? ? Modules accepted: Orders ? ?

## 2021-05-24 NOTE — Telephone Encounter (Signed)
I faxed over referral for Dr. Eliezer Lofts Renfroe with [facility]. [phone], [phone] fax for neuropsych eval.  From note it looks like with go to pcp for evaluation.   Pt had PET scan 02-25-2021. ?

## 2021-05-24 NOTE — Telephone Encounter (Signed)
Referral sent to [facility] [phone]. ?

## 2021-05-24 NOTE — Telephone Encounter (Signed)
Received fax confirmation for referral.  ?

## 2021-05-25 NOTE — Telephone Encounter (Signed)
Spoke with Dr Jaynee Eagles. At this time does not think MRI needed.  ?

## 2021-05-27 ENCOUNTER — Telehealth: Payer: Self-pay | Admitting: Cardiovascular Disease

## 2021-05-27 NOTE — Telephone Encounter (Signed)
? ?  Pre-operative Risk Assessment  ?  ?Patient Name: [name]  ?DOB: 1934/03/04 ?MRN: [id number]  ? ? ? ?Request for Surgical Clearance   ? ?Procedure:  Root canal on tooth 17 ? ?Date of Surgery:  Clearance 06/10/21                              ?   ?Surgeon:  Dr. Murrell Redden ?Surgeon's Group or Practice Name:  Dr. Murrell Redden ?Phone number:  [phone] ?Fax number:  [phone] ?  ?Type of Clearance Requested:   ?- Medical  ?  ?Type of Anesthesia:  Local  ?  ?Additional requests/questions:   ? ?Signed, ?Selena Zobro   ?05/27/2021, 10:51 AM   ?

## 2021-05-28 ENCOUNTER — Telehealth: Payer: Self-pay | Admitting: *Deleted

## 2021-05-28 NOTE — Telephone Encounter (Signed)
Patient with diagnosis of afib on Eliquis for anticoagulation.   ? ?Procedure: Root canal on tooth 17 ? Date of procedure: 06/10/21 ? ?CHA2DS2-VASc Score = 5  ? This indicates a 7.2% annual risk of stroke. ?The patient's score is based upon: ?CHF History: 0 ?HTN History: 1 ?Diabetes History: 0 ?Stroke History: 0 ?Vascular Disease History: 1 ?Age Score: 2 ?Gender Score: 1 ?  ?  ? ?CrCl 42 ml/min ? ?Patient does NOT require pre-op antibiotics for dental procedure. ? ?Per office protocol, patient can hold Eliquis for 1 day prior to procedure.   ?

## 2021-05-28 NOTE — Telephone Encounter (Signed)
The pt is agreeable to plan of care for tele pre op appt. Pt and her daughter went thru med rec with me for appt. Consent is done.  ? ?  ?Patient Consent for Virtual Visit  ? ? ?   ? ?[name] has provided verbal consent on 05/28/2021 for a virtual visit (video or telephone). ? ? ?CONSENT FOR VIRTUAL VISIT FOR:  [name]  ?By participating in this virtual visit I agree to the following: ? ?I hereby voluntarily request, consent and authorize [facility] and its employed or contracted physicians, physician assistants, nurse practitioners or other licensed health care professionals (the Practitioner), to provide me with telemedicine health care services (the ?Services") as deemed necessary by the treating Practitioner. I acknowledge and consent to receive the Services by the Practitioner via telemedicine. I understand that the telemedicine visit will involve communicating with the Practitioner through live audiovisual communication technology and the disclosure of certain medical information by electronic transmission. I acknowledge that I have been given the opportunity to request an in-person assessment or other available alternative prior to the telemedicine visit and am voluntarily participating in the telemedicine visit. ? ?I understand that I have the right to withhold or withdraw my consent to the use of telemedicine in the course of my care at any time, without affecting my right to future care or treatment, and that the Practitioner or I may terminate the telemedicine visit at any time. I understand that I have the right to inspect all information obtained and/or recorded in the course of the telemedicine visit and may receive copies of available information for a reasonable fee.  I understand that some of the potential risks of receiving the Services via telemedicine include:  ?Delay or interruption in medical evaluation due to technological equipment failure or disruption; ?Information  transmitted may not be sufficient (e.g. poor resolution of images) to allow for appropriate medical decision making by the Practitioner; and/or  ?In rare instances, security protocols could fail, causing a breach of personal health information. ? ?Furthermore, I acknowledge that it is my responsibility to provide information about my medical history, conditions and care that is complete and accurate to the best of my ability. I acknowledge that Practitioner's advice, recommendations, and/or decision may be based on factors not within their control, such as incomplete or inaccurate data provided by me or distortions of diagnostic images or specimens that may result from electronic transmissions. I understand that the practice of medicine is not an exact science and that Practitioner makes no warranties or guarantees regarding treatment outcomes. I acknowledge that a copy of this consent can be made available to me via my patient portal ([facility]), or I can request a printed copy by calling the office of [facility].   ? ?I understand that my insurance will be billed for this visit.  ? ?I have read or had this consent read to me. ?I understand the contents of this consent, which adequately explains the benefits and risks of the Services being provided via telemedicine.  ?I have been provided ample opportunity to ask questions regarding this consent and the Services and have had my questions answered to my satisfaction. ?I give my informed consent for the services to be provided through the use of telemedicine in my medical care ? ? ? ?

## 2021-05-28 NOTE — Telephone Encounter (Signed)
? ?  Patient Name: [name]  ?DOB: [birth year] ?MRN: [id number] ? ?Primary Cardiologist: Sherren Mocha, MD ? ?Chart reviewed as part of pre-operative protocol coverage.  ? ?Preoperative team, please contact this patient and set up a phone call appointment for further cardiac evaluation.   ? ?Thank you for your help. ? ?Patient with diagnosis of afib on Eliquis for anticoagulation.   ?  ?Procedure: Root canal on tooth 17 ? Date of procedure: 06/10/21 ?  ?CHA2DS2-VASc Score = 5  ? This indicates a 7.2% annual risk of stroke. ?The patient's score is based upon: ?CHF History: 0 ?HTN History: 1 ?Diabetes History: 0 ?Stroke History: 0 ?Vascular Disease History: 1 ?Age Score: 2 ?Gender Score: 1 ?  ?  ?  ?CrCl 42 ml/min ?  ?Patient does NOT require pre-op antibiotics for dental procedure. ?  ?Per office protocol, patient can hold Eliquis for 1 day prior to procedure.  ? ?[name], NP ?05/28/2021, 10:57 AM ? ?

## 2021-05-28 NOTE — Telephone Encounter (Signed)
The pt is agreeable to plan of care for tele pre op appt. Pt and her daughter went thru med rec with me for appt. Consent is done.  ?

## 2021-06-01 ENCOUNTER — Telehealth: Payer: Medicare Other

## 2021-06-04 ENCOUNTER — Encounter: Payer: Self-pay | Admitting: Physician Assistant

## 2021-06-04 ENCOUNTER — Ambulatory Visit (INDEPENDENT_AMBULATORY_CARE_PROVIDER_SITE_OTHER): Payer: Medicare Other | Admitting: Physician Assistant

## 2021-06-04 DIAGNOSIS — Z01810 Encounter for preprocedural cardiovascular examination: Secondary | ICD-10-CM

## 2021-06-04 NOTE — Progress Notes (Signed)
? ?Virtual Visit via Telephone Note  ? ?This visit type was conducted due to national recommendations for restrictions regarding the COVID-19 Pandemic (e.g. social distancing) in an effort to limit this patient's exposure and mitigate transmission in our community.  Due to her co-morbid illnesses, this patient is at least at moderate risk for complications without adequate follow up.  This format is felt to be most appropriate for this patient at this time.  The patient did not have access to video technology/had technical difficulties with video requiring transitioning to audio format only (telephone).  All issues noted in this document were discussed and addressed.  No physical exam could be performed with this format.  Please refer to the patient's chart for her  consent to telehealth for [facility]. ? ?Evaluation Performed:  Preoperative cardiovascular risk assessment ?_____________  ? ?Date:  06/04/2021  ? ?Patient ID:  Sherri, Mcgrath 09/15/1934, MRN [id number] ?Patient Location:  ?Home ?Provider location:   ?Office ? ?Primary Care Provider:  [name], MD ?Primary Cardiologist:  Sherren Mocha, MD ? ?Chief Complaint  ?  ?87 y.o. y/o female with a h/o CAD s/p CABG, PAF, pulmonary sarcoidosis, HTN, HLD, SVT, and carotid stenosis, who is pending root canal, and presents today for telephonic preoperative cardiovascular risk assessment. ? ?Past Medical History  ?  ?Past Medical History:  ?Diagnosis Date  ? Allergy   ? SEASONAL  ? Anemia   ? Arthritis   ? Asthma   ? "slight"   ? Baker's cyst of knee, right   ? CAD (coronary artery disease)   ? a. Myoview 10/15 - normal EF 70% // Myoview 11/16: EF 75%, normal perfusion, low risk // c. LHC 3/17 - LAD irregs, oLCx 70 (neg FFR), mRCA 30, EF 55-65% >> med Rx // Myoview 07/2018:  EF 73, extracardiac uptake, no significant ischemia (reviewed with Dr. Burt Knack), Low Risk // Myoview 6/22: EF 72, normal perfusion, low risk  ? Carotid stenosis   ? a. Carotid US  9/15 - Bilateral 1-39% ICA >> FU 2 years // b. Bilateral ICA 1-39 >> FU prn // Carotid US 06/2018: bilat 1-39; fu prn  ? Dyspnea   ? due to Sarcoidosis. When is windy or humed  ? Dysrhythmia   ? fast heart rate  ? Elbow fracture 04/2017  ? Right, had surgery  ? Esophageal reflux   ? Hematochezia   ? History of echocardiogram   ? a. Echo 10/15 - EF 60-65%, no RWMA  ? HLD (hyperlipidemia)   ? Hx of colonoscopy   ? Macular pucker, right eye   ? will have removed on 01/26/21  ? Osteoporosis   ? Other chronic pulmonary heart diseases   ? Paroxysmal supraventricular tachycardia ([place])   ? Pneumonia   ? Sarcoidosis   ? ?Past Surgical History:  ?Procedure Laterality Date  ? arm surgery Right   ? BLADDER SURGERY    ? CARDIAC CATHETERIZATION N/A 05/20/2015  ? Procedure: Left Heart Cath and Coronary Angiography;  Surgeon: Sherren Mocha, MD;  Location: [place];  Service: Cardiovascular;  Laterality: N/A;  ? CARPAL TUNNEL RELEASE Left   ? CATARACT EXTRACTION Right 07/2020  ? 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 COLONOSCOPY    ? CYSTOSCOPY W/ RETROGRADES Left 07/30/2019  ? Procedure: CYSTOSCOPY WITH RETROGRADE PYELOGRAM LEFT STENT PLACEMENT;  Surgeon: Ardis Hughs, MD;  Location: [facility];  Service: Urology;  Laterality: Left;  ? CYSTOSCOPY WITH RETROGRADE PYELOGRAM, URETEROSCOPY AND STENT PLACEMENT Left 08/20/2019  ?  Procedure: CYSTOSCOPY, URETEROSCOPY AND STENT PLACEMENT;  Surgeon: Robley Fries, MD;  Location: [facility];  Service: Urology;  Laterality: Left;  1 HR  ? FOOT SURGERY Right   ? HIP SURGERY Right 2011  ? full replacement  ? HOLMIUM LASER APPLICATION Left 07/37/1062  ? Procedure: HOLMIUM LASER APPLICATION;  Surgeon: Robley Fries, MD;  Location: [facility];  Service: Urology;  Laterality: Left;  ? LEG SURGERY Left 06/2012  ? femur fracture s/p open and closed reduction in [place], Dr. Jimmye Norman  ? lens replacement Right   ? right eye  ? ORIF ELBOW FRACTURE Right 05/09/2017  ? Procedure: ORIF right olecranon  fracture with repair/reconstruction, ulnar nerve transposition as needed;  Surgeon: Roseanne Kaufman, MD;  Location: [facility];  Service: Orthopedics;  Laterality: Right;  Requests 90 mins  ? pelvis fracture    ? POLYPECTOMY    ? REVERSE SHOULDER ARTHROPLASTY Left 06/13/2019  ? Procedure: REVERSE SHOULDER ARTHROPLASTY;  Surgeon: Justice Britain, MD;  Location: [facility];  Service: Orthopedics;  Laterality: Left;  15mn  ? SHOULDER ARTHROSCOPY W/ ROTATOR CUFF REPAIR Right   ? TRAPEZIUM RESECTION Right   ? ? ?Allergies ? ?Allergies  ?Allergen Reactions  ? Levaquin [Levofloxacin] Other (See Comments)  ?  Leg pain ..Marland KitchenPer doctor not to take again  ? Sulfonamide Derivatives   ?  Pt unsure  ? Oxycodone Nausea Only  ?  Can take oxycodone with Zofran  ? Morphine Nausea Only  ? ? ?History of Present Illness  ?  ?Sherri JUNGMANis a 87y.o. female who presents via audio/video conferencing for a telehealth visit today.  Pt was last seen in cardiology clinic on 02/24/21 by Dr. CBurt Knack  At that time Sherri NIERENBERGreported shortness of breath. She underwent nuclear stress test in 2022 that was nonischemic for these symptoms. No further cardiac workup was recommended. The patient is now pending root canal.  Since her last visit, she is stable with no interval change in her cardiac history. She is unable to complete 4.0 METS. Since the request is for a low risk procedure using local anesthetic, I do not think additional testing is warranted.  ? ? ?Home Medications  ?  ?Prior to Admission medications   ?Medication Sig Start Date End Date Taking? Authorizing Provider  ?acetaminophen (TYLENOL) 500 MG tablet Take 1,000 mg by mouth every 6 (six) hours as needed for mild pain.     [provider]  ?albuterol (VENTOLIN HFA) 108 (90 Base) MCG/ACT inhaler Inhale 2 puffs into the lungs every 4 (four) hours as needed for wheezing or shortness of breath. 06/16/20   BCollene Gobble MD  ?amoxicillin-clavulanate (AUGMENTIN) 875-125 MG tablet  Take 1 tablet by mouth 2 (two) times daily. ?Patient not taking: Reported on 05/28/2021 03/17/21   JSalvadore Dom MD  ?apixaban ([facility] [place] 2.5 MG TABS tablet TAKE 1 TABLET(2.5 MG) BY MOUTH TWICE DAILY 04/21/21   CSherren Mocha MD  ?budesonide-formoterol ([facility] 160-4.5 MCG/ACT inhaler Symbicort 160 mcg-4.5 mcg/actuation HFA aerosol inhaler ? INHALE 2 PUFFS INTO THE LUNGS EVERY MORNING AND EVERY NIGHT AT BEDTIME    [provider]  ?calcium carbonate (TUMS - DOSED IN MG ELEMENTAL CALCIUM) 500 MG chewable tablet Chew 1 tablet by mouth daily as needed for indigestion.     [provider]  ?cetirizine (ZYRTEC) 10 MG tablet Take 1 tablet (10 mg total) by mouth daily. 06/16/20   BCollene Gobble MD  ?denosumab (PROLIA) 60 MG/ML SOLN  injection Inject 60 mg into the skin every 6 (six) months. Administer in upper arm, thigh, or abdomen    [provider]  ?dextromethorphan (DELSYM) 30 MG/5ML liquid Take 30 mg by mouth 2 (two) times daily as needed for cough.    [provider]  ?diltiazem (CARDIZEM CD) 180 MG 24 hr capsule Take 180 mg by mouth at bedtime.  05/08/19   [provider]  ?docusate sodium (COLACE) 50 MG capsule Take 100 mg by mouth at bedtime.     [provider]  ?donepezil (ARICEPT) 5 MG tablet TAKE 1 TABLET(5 MG) BY MOUTH AT BEDTIME 03/24/21   Melvenia Beam, MD  ?fluticasone (FLONASE) 50 MCG/ACT nasal spray Place 2 sprays into both nostrils 2 (two) times daily. 06/16/20   Collene Gobble, MD  ?furosemide (LASIX) 20 MG tablet Take 1 tablet (20 mg total) by mouth daily. ?Patient taking differently: Take 20 mg by mouth daily as needed. 07/22/20   [name] T, PA-C  ?ipratropium (ATROVENT HFA) 17 MCG/ACT inhaler Inhale 1 puff into the lungs 2 (two) times daily. 07/24/19 02/24/21  Regalado, Jerald Kief A, MD  ?meloxicam (MOBIC) 15 MG tablet as needed.    [provider]  ?nitroGLYCERIN (NITROSTAT) 0.4 MG SL tablet Place 1 tablet (0.4 mg total) under the  tongue every 5 (five) minutes as needed for chest pain. 11/06/19   [name] T, PA-C  ?omeprazole (PRILOSEC) 40 MG capsule Take 40 mg by mouth 2 (two) times daily.  11/03/13   [provider]  ?Annie Paras

## 2021-06-08 ENCOUNTER — Telehealth: Payer: Self-pay | Admitting: *Deleted

## 2021-06-08 NOTE — Telephone Encounter (Signed)
Message came back not read.  Printed and placed in mail.  ?This message is to inform you that the patient has not yet read the following message. (Notification date: June 08, 2021) ? ?referral and MRI follow up ? ?From  ?Brandon Melnick, RN To  ?Ronny Bacon and Delivered  ?05/25/2021  2:08 PM ?  ?Hi [name] and Saylah.   ?  ?Wanted to followup on the referral for neuropsych.  We did send a new referral to Dr. Keane Scrape I faxed over referral for Dr. Eliezer Lofts Renfroe with [facility]. [phone], [phone] fax for neuropsych eval.   Per Dr. Jaynee Eagles she did not think [facility] needed an MRI.  I hope she was able to  see primary care to evaluate for any infection .   ?  ?Lovey Newcomer Rn ? ?[address] [place] ? ? ?MyChart User Last Read On ?[name] Not Read ? ? ? ? ?

## 2021-07-22 ENCOUNTER — Ambulatory Visit: Payer: Medicare Other | Admitting: Adult Health

## 2021-07-29 ENCOUNTER — Telehealth: Payer: Self-pay | Admitting: *Deleted

## 2021-07-29 NOTE — Telephone Encounter (Signed)
   Pre-operative Risk Assessment    Patient Name: [name]  DOB: April 13, 1934 MRN: [id number]      Request for Surgical Clearance    Procedure:   LUMBAR ESI INJECTION  Date of Surgery:  Clearance 08/03/21                                 Surgeon:  NOT LISTED Surgeon's Group or Practice Name:  [facility] Phone number:  [phone] EXT [phone] [address]aren[address] Chancy Fax number:  [phone]   Type of Clearance Requested:   - Medical  - Pharmacy:  Hold Apixaban (Eliquis) x 3 DAYS PRIOR   Type of Anesthesia:  Not Indicated   Additional requests/questions:    Jiles Prows   07/29/2021, 12:16 PM

## 2021-07-30 NOTE — Telephone Encounter (Signed)
   Name: [name]  DOB: Sep 14, 1934  MRN: [id number]   Primary Cardiologist: Sherren Mocha, MD  Chart reviewed as part of pre-operative protocol coverage. Patient was contacted 07/30/2021 in reference to pre-operative risk assessment for pending surgery as outlined below.  [name] was last seen on 06/04/2021  by Fabian Sharp, PA.  Since that day, Sherri Frieze Kleve/ has done well from a cardiac standpoint.  Denies any new symptoms or concerns.  Therefore, based on ACC/AHA guidelines, the patient would be at acceptable risk for the planned procedure without further cardiovascular testing.   Patient with diagnosis of atrial fibrillation on Eliquis for anticoagulation.     Procedure: lumbar ESI injection Date of procedure: 08/03/21     CHA2DS2-VASc Score = 5   This indicates a 7.2% annual risk of stroke. The patient's score is based upon: CHF History: 0 HTN History: 1 Diabetes History: 0 Stroke History: 0 Vascular Disease History: 1 Age Score: 2 Gender Score: 1   CrCl 45 Platelet count 193   Per office protocol, patient can hold Eliquis for 3 days prior to procedure.  Patient will not need bridging with Lovenox (enoxaparin) around procedure.  The patient was advised that if she develops new symptoms prior to surgery to contact our office to arrange for a follow-up visit, and she verbalized understanding.  I will route this recommendation to the requesting party via Epic fax function and remove from pre-op pool. Please call with questions.  [name], NP 07/30/2021, 4:55 PM

## 2021-07-30 NOTE — Telephone Encounter (Signed)
Patient with diagnosis of atrial fibrillation on Eliquis for anticoagulation.    Procedure: lumbar ESI injection Date of procedure: 08/03/21   CHA2DS2-VASc Score = 5   This indicates a 7.2% annual risk of stroke. The patient's score is based upon: CHF History: 0 HTN History: 1 Diabetes History: 0 Stroke History: 0 Vascular Disease History: 1 Age Score: 2 Gender Score: 1  CrCl 45 Platelet count 193  Per office protocol, patient can hold Eliquis for 3 days prior to procedure.   Patient will not need bridging with Lovenox (enoxaparin) around procedure.

## 2021-08-07 ENCOUNTER — Other Ambulatory Visit: Payer: Self-pay

## 2021-08-07 ENCOUNTER — Emergency Department (HOSPITAL_COMMUNITY): Payer: Medicare Other

## 2021-08-07 ENCOUNTER — Inpatient Hospital Stay (HOSPITAL_COMMUNITY)
Admission: EM | Admit: 2021-08-07 | Discharge: 2021-08-10 | DRG: 287 | Disposition: A | Discharge status: Home / Self Care | Payer: Medicare Other | Attending: Family Medicine | Admitting: Family Medicine

## 2021-08-07 ENCOUNTER — Encounter (HOSPITAL_COMMUNITY): Payer: Self-pay | Admitting: Internal Medicine

## 2021-08-07 DIAGNOSIS — Z7901 Long term (current) use of anticoagulants: Secondary | ICD-10-CM

## 2021-08-07 DIAGNOSIS — I110 Hypertensive heart disease with heart failure: Secondary | ICD-10-CM | POA: Diagnosis present

## 2021-08-07 DIAGNOSIS — X58XXXA Exposure to other specified factors, initial encounter: Secondary | ICD-10-CM | POA: Diagnosis present

## 2021-08-07 DIAGNOSIS — N3289 Other specified disorders of bladder: Secondary | ICD-10-CM

## 2021-08-07 DIAGNOSIS — I6523 Occlusion and stenosis of bilateral carotid arteries: Secondary | ICD-10-CM

## 2021-08-07 DIAGNOSIS — D869 Sarcoidosis, unspecified: Secondary | ICD-10-CM | POA: Diagnosis present

## 2021-08-07 DIAGNOSIS — Z882 Allergy status to sulfonamides status: Secondary | ICD-10-CM

## 2021-08-07 DIAGNOSIS — J449 Chronic obstructive pulmonary disease, unspecified: Secondary | ICD-10-CM | POA: Diagnosis present

## 2021-08-07 DIAGNOSIS — R4189 Other symptoms and signs involving cognitive functions and awareness: Secondary | ICD-10-CM | POA: Diagnosis present

## 2021-08-07 DIAGNOSIS — M545 Low back pain, unspecified: Secondary | ICD-10-CM | POA: Diagnosis present

## 2021-08-07 DIAGNOSIS — K219 Gastro-esophageal reflux disease without esophagitis: Secondary | ICD-10-CM

## 2021-08-07 DIAGNOSIS — I4891 Unspecified atrial fibrillation: Secondary | ICD-10-CM | POA: Diagnosis present

## 2021-08-07 DIAGNOSIS — R58 Hemorrhage, not elsewhere classified: Secondary | ICD-10-CM

## 2021-08-07 DIAGNOSIS — N318 Other neuromuscular dysfunction of bladder: Secondary | ICD-10-CM | POA: Diagnosis present

## 2021-08-07 DIAGNOSIS — I2510 Atherosclerotic heart disease of native coronary artery without angina pectoris: Secondary | ICD-10-CM | POA: Diagnosis not present

## 2021-08-07 DIAGNOSIS — E785 Hyperlipidemia, unspecified: Secondary | ICD-10-CM | POA: Diagnosis present

## 2021-08-07 DIAGNOSIS — E782 Mixed hyperlipidemia: Secondary | ICD-10-CM

## 2021-08-07 DIAGNOSIS — M810 Age-related osteoporosis without current pathological fracture: Secondary | ICD-10-CM | POA: Diagnosis present

## 2021-08-07 DIAGNOSIS — D860 Sarcoidosis of lung: Secondary | ICD-10-CM | POA: Diagnosis present

## 2021-08-07 DIAGNOSIS — R079 Chest pain, unspecified: Secondary | ICD-10-CM | POA: Diagnosis present

## 2021-08-07 DIAGNOSIS — Z8249 Family history of ischemic heart disease and other diseases of the circulatory system: Secondary | ICD-10-CM

## 2021-08-07 DIAGNOSIS — F0390 Unspecified dementia without behavioral disturbance: Secondary | ICD-10-CM | POA: Diagnosis present

## 2021-08-07 DIAGNOSIS — I209 Angina pectoris, unspecified: Secondary | ICD-10-CM

## 2021-08-07 DIAGNOSIS — I214 Non-ST elevation (NSTEMI) myocardial infarction: Secondary | ICD-10-CM

## 2021-08-07 DIAGNOSIS — Z881 Allergy status to other antibiotic agents status: Secondary | ICD-10-CM

## 2021-08-07 DIAGNOSIS — I25110 Atherosclerotic heart disease of native coronary artery with unstable angina pectoris: Secondary | ICD-10-CM | POA: Diagnosis not present

## 2021-08-07 DIAGNOSIS — Z79899 Other long term (current) drug therapy: Secondary | ICD-10-CM

## 2021-08-07 DIAGNOSIS — I480 Paroxysmal atrial fibrillation: Secondary | ICD-10-CM | POA: Diagnosis present

## 2021-08-07 DIAGNOSIS — D72829 Elevated white blood cell count, unspecified: Secondary | ICD-10-CM | POA: Diagnosis present

## 2021-08-07 DIAGNOSIS — R778 Other specified abnormalities of plasma proteins: Secondary | ICD-10-CM | POA: Diagnosis not present

## 2021-08-07 DIAGNOSIS — D649 Anemia, unspecified: Secondary | ICD-10-CM

## 2021-08-07 DIAGNOSIS — I471 Supraventricular tachycardia, unspecified: Secondary | ICD-10-CM | POA: Diagnosis present

## 2021-08-07 DIAGNOSIS — R7989 Other specified abnormal findings of blood chemistry: Secondary | ICD-10-CM

## 2021-08-07 DIAGNOSIS — I10 Essential (primary) hypertension: Secondary | ICD-10-CM

## 2021-08-07 DIAGNOSIS — I5032 Chronic diastolic (congestive) heart failure: Secondary | ICD-10-CM | POA: Diagnosis present

## 2021-08-07 DIAGNOSIS — R1310 Dysphagia, unspecified: Secondary | ICD-10-CM | POA: Diagnosis present

## 2021-08-07 DIAGNOSIS — Z885 Allergy status to narcotic agent status: Secondary | ICD-10-CM

## 2021-08-07 DIAGNOSIS — J479 Bronchiectasis, uncomplicated: Secondary | ICD-10-CM

## 2021-08-07 DIAGNOSIS — Z7951 Long term (current) use of inhaled steroids: Secondary | ICD-10-CM

## 2021-08-07 DIAGNOSIS — S60212A Contusion of left wrist, initial encounter: Secondary | ICD-10-CM | POA: Diagnosis present

## 2021-08-07 DIAGNOSIS — I779 Disorder of arteries and arterioles, unspecified: Secondary | ICD-10-CM | POA: Diagnosis present

## 2021-08-07 DIAGNOSIS — R0602 Shortness of breath: Secondary | ICD-10-CM

## 2021-08-07 LAB — BRAIN NATRIURETIC PEPTIDE: B Natriuretic Peptide: 387.3 pg/mL — ABNORMAL HIGH (ref 0.0–100.0)

## 2021-08-07 LAB — BASIC METABOLIC PANEL
Anion gap: 11 (ref 5–15)
BUN: 18 mg/dL (ref 8–23)
CO2: 19 mmol/L — ABNORMAL LOW (ref 22–32)
Calcium: 8.1 mg/dL — ABNORMAL LOW (ref 8.9–10.3)
Chloride: 108 mmol/L (ref 98–111)
Creatinine, Ser: 0.83 mg/dL (ref 0.44–1.00)
GFR, Estimated: >60 mL/min (ref >60)
Glucose, Bld: 103 mg/dL — ABNORMAL HIGH (ref 70–99)
Potassium: 3.7 mmol/L (ref 3.5–5.1)
Sodium: 138 mmol/L (ref 135–145)

## 2021-08-07 LAB — CBC
HCT: 36.1 % (ref 36.0–46.0)
Hemoglobin: 11.4 g/dL — ABNORMAL LOW (ref 12.0–15.0)
MCH: 29.9 pg (ref 26.0–34.0)
MCHC: 31.6 g/dL (ref 30.0–36.0)
MCV: 94.8 fL (ref 80.0–100.0)
Platelets: 243 10*3/uL (ref 150–400)
RBC: 3.81 MIL/uL — ABNORMAL LOW (ref 3.87–5.11)
RDW: 14.2 % (ref 11.5–15.5)
WBC: 11.1 10*3/uL — ABNORMAL HIGH (ref 4.0–10.5)
nRBC: 0 % (ref 0.0–0.2)

## 2021-08-07 LAB — APTT: aPTT: 22 seconds — ABNORMAL LOW (ref 24–36)

## 2021-08-07 LAB — TROPONIN I (HIGH SENSITIVITY)
Troponin I (High Sensitivity): 23 ng/L — ABNORMAL HIGH (ref <18)
Troponin I (High Sensitivity): 33 ng/L — ABNORMAL HIGH (ref <18)

## 2021-08-07 LAB — HEPARIN LEVEL (UNFRACTIONATED): Heparin Unfractionated: 0.23 IU/mL — ABNORMAL LOW (ref 0.30–0.70)

## 2021-08-07 MED ORDER — HEPARIN (PORCINE) 25000 UT/250ML-% IV SOLN
650.0000 [IU]/h | INTRAVENOUS | Status: DC
  Administered 2021-08-08: 650 [IU]/h via INTRAVENOUS
  Filled 2021-08-07: qty 250

## 2021-08-07 MED ORDER — ALBUTEROL SULFATE HFA 108 (90 BASE) MCG/ACT IN AERS
2.0000 | INHALATION_SPRAY | RESPIRATORY_TRACT | Status: DC | PRN
Start: 2021-08-07 — End: 2021-08-07

## 2021-08-07 MED ORDER — METOPROLOL TARTRATE 12.5 MG HALF TABLET
12.5000 mg | ORAL_TABLET | Freq: Two times a day (BID) | ORAL | Status: DC
  Administered 2021-08-07 – 2021-08-09 (×3): 12.5 mg via ORAL
  Filled 2021-08-07 (×6): qty 1

## 2021-08-07 MED ORDER — PANTOPRAZOLE SODIUM 40 MG PO TBEC
40.0000 mg | DELAYED_RELEASE_TABLET | Freq: Every day | ORAL | Status: DC
  Administered 2021-08-08 – 2021-08-10 (×3): 40 mg via ORAL
  Filled 2021-08-07 (×3): qty 1

## 2021-08-07 MED ORDER — VIBEGRON 75 MG PO TABS
75.0000 mg | ORAL_TABLET | Freq: Every day | ORAL | Status: DC
  Administered 2021-08-09 – 2021-08-10 (×2): 75 mg via ORAL
  Filled 2021-08-07 (×4): qty 1

## 2021-08-07 MED ORDER — MOMETASONE FURO-FORMOTEROL FUM 200-5 MCG/ACT IN AERO
2.0000 | INHALATION_SPRAY | Freq: Two times a day (BID) | RESPIRATORY_TRACT | Status: DC
  Administered 2021-08-08 – 2021-08-10 (×5): 2 via RESPIRATORY_TRACT
  Filled 2021-08-07: qty 8.8

## 2021-08-07 MED ORDER — ACETAMINOPHEN 325 MG PO TABS
650.0000 mg | ORAL_TABLET | ORAL | Status: DC | PRN
  Administered 2021-08-09: 650 mg via ORAL
  Filled 2021-08-07: qty 2

## 2021-08-07 MED ORDER — HEPARIN SODIUM (PORCINE) 1000 UNIT/ML IJ SOLN: 3500.0000 [IU] | Freq: Once | INTRAMUSCULAR | Status: DC

## 2021-08-07 MED ORDER — SERTRALINE HCL 50 MG PO TABS
25.0000 mg | ORAL_TABLET | Freq: Every day | ORAL | Status: DC
  Administered 2021-08-07 – 2021-08-09 (×3): 25 mg via ORAL
  Filled 2021-08-07 (×4): qty 1

## 2021-08-07 MED ORDER — NITROGLYCERIN 0.4 MG SL SUBL
0.4000 mg | SUBLINGUAL_TABLET | SUBLINGUAL | Status: DC | PRN
  Administered 2021-08-07: 0.4 mg via SUBLINGUAL
  Filled 2021-08-07: qty 1

## 2021-08-07 MED ORDER — NITROGLYCERIN 2 % TD OINT: 1.0000 [in_us] | TOPICAL_OINTMENT | Freq: Four times a day (QID) | TRANSDERMAL | Status: DC

## 2021-08-07 MED ORDER — TRAMADOL HCL 50 MG PO TABS
50.0000 mg | ORAL_TABLET | Freq: Three times a day (TID) | ORAL | Status: DC | PRN
  Administered 2021-08-09: 50 mg via ORAL
  Filled 2021-08-07: qty 1

## 2021-08-07 MED ORDER — DONEPEZIL HCL 5 MG PO TABS
5.0000 mg | ORAL_TABLET | Freq: Every day | ORAL | Status: DC
Start: 2021-08-07 — End: 2021-08-10
  Administered 2021-08-07 – 2021-08-09 (×3): 5 mg via ORAL
  Filled 2021-08-07 (×4): qty 1

## 2021-08-07 MED ORDER — ALBUTEROL SULFATE (2.5 MG/3ML) 0.083% IN NEBU
2.5000 mg | INHALATION_SOLUTION | RESPIRATORY_TRACT | Status: DC | PRN
Start: 2021-08-07 — End: 2021-08-10

## 2021-08-07 MED ORDER — APIXABAN 2.5 MG PO TABS
2.5000 mg | ORAL_TABLET | Freq: Two times a day (BID) | ORAL | Status: DC
Start: 2021-08-07 — End: 2021-08-07
  Filled 2021-08-07: qty 1

## 2021-08-07 MED ORDER — ONDANSETRON HCL 4 MG/2ML IJ SOLN: 4.0000 mg | Freq: Four times a day (QID) | INTRAMUSCULAR | Status: DC | PRN

## 2021-08-07 MED ORDER — DILTIAZEM HCL ER COATED BEADS 180 MG PO CP24
180.0000 mg | ORAL_CAPSULE | Freq: Every day | ORAL | Status: DC
  Administered 2021-08-08 – 2021-08-10 (×3): 180 mg via ORAL
  Filled 2021-08-07 (×3): qty 1

## 2021-08-07 MED ORDER — OXYBUTYNIN CHLORIDE 5 MG PO TABS
5.0000 mg | ORAL_TABLET | Freq: Every day | ORAL | Status: DC
Start: 2021-08-07 — End: 2021-08-10
  Administered 2021-08-07 – 2021-08-09 (×3): 5 mg via ORAL
  Filled 2021-08-07 (×4): qty 1

## 2021-08-07 MED ORDER — ROSUVASTATIN CALCIUM 5 MG PO TABS
10.0000 mg | ORAL_TABLET | ORAL | Status: DC
  Administered 2021-08-09: 10 mg via ORAL
  Filled 2021-08-07: qty 2

## 2021-08-07 NOTE — ED Provider Notes (Signed)
[facility] EMERGENCY DEPARTMENT Provider Note   CSN: [id number] Arrival date & time: 08/07/21  1519     History  Chief Complaint  Patient presents with   Chest Pain    [name] is a 87 y.o. female.  HPI     87 year old female comes in with chief complaint of chest pain. She has known history of coronary artery disease, sarcoidosis, A-fib.  Patient indicates that she has been feeling unwell since the last 2 days.  She started noticing worsening shortness of breath and chest heaviness.  Chest pain/heaviness is coming and going, no specific evoking, aggravating or relieving factors.  She has associated shortness of breath that has worsened compared to her baseline.  Today she took some nitroglycerin and had relief.  She called EMS, and allegedly she was found to be in tachycardia when they first arrived.  That episode lasted about 30 seconds.  Patient has history of CAD, but is unsure if this pain is similar to her ischemic pain in the past.  She has sarcoidosis, has been taking breathing treatments without any improvement in her shortness of breath.  There is no new cough, fevers, chills.  Patient typically does not get chest pain like this.  Her A-fib is well controlled and has not caused her to have weakness or chest pain that she can recall.   Home Medications Prior to Admission medications   Medication Sig Start Date End Date Taking? Authorizing Provider  acetaminophen (TYLENOL) 500 MG tablet Take 1,000 mg by mouth every 6 (six) hours as needed for mild pain.     [provider]  albuterol (VENTOLIN HFA) 108 (90 Base) MCG/ACT inhaler Inhale 2 puffs into the lungs every 4 (four) hours as needed for wheezing or shortness of breath. 06/16/20   Collene Gobble, MD  amoxicillin-clavulanate (AUGMENTIN) 875-125 MG tablet Take 1 tablet by mouth 2 (two) times daily. Patient not taking: Reported on 05/28/2021 03/17/21   Salvadore Dom, MD  apixaban  (ELIQUIS) 2.5 MG TABS tablet TAKE 1 TABLET(2.5 MG) BY MOUTH TWICE DAILY 04/21/21   Sherren Mocha, MD  budesonide-formoterol [facility]) 160-4.5 MCG/ACT inhaler Symbicort 160 mcg-4.5 mcg/actuation HFA aerosol inhaler  INHALE 2 PUFFS INTO THE LUNGS EVERY MORNING AND EVERY NIGHT AT BEDTIME    [provider]  calcium carbonate (TUMS - DOSED IN MG ELEMENTAL CALCIUM) 500 MG chewable tablet Chew 1 tablet by mouth daily as needed for indigestion.     [provider]  cetirizine (ZYRTEC) 10 MG tablet Take 1 tablet (10 mg total) by mouth daily. 06/16/20   Collene Gobble, MD  denosumab (PROLIA) 60 MG/ML SOLN injection Inject 60 mg into the skin every 6 (six) months. Administer in upper arm, thigh, or abdomen    [provider]  dextromethorphan (DELSYM) 30 MG/5ML liquid Take 30 mg by mouth 2 (two) times daily as needed for cough.    [provider]  diltiazem (CARDIZEM CD) 180 MG 24 hr capsule Take 180 mg by mouth at bedtime.  05/08/19   [provider]  docusate sodium (COLACE) 50 MG capsule Take 100 mg by mouth at bedtime.     [provider]  donepezil (ARICEPT) 5 MG tablet TAKE 1 TABLET(5 MG) BY MOUTH AT BEDTIME 03/24/21   Melvenia Beam, MD  fluticasone (FLONASE) 50 MCG/ACT nasal spray Place 2 sprays into both nostrils 2 (two) times daily. 06/16/20   Collene Gobble, MD  furosemide (LASIX) 20 MG  tablet Take 1 tablet (20 mg total) by mouth daily. Patient taking differently: Take 20 mg by mouth daily as needed. 07/22/20   [name] T, PA-C  ipratropium (ATROVENT HFA) 17 MCG/ACT inhaler Inhale 1 puff into the lungs 2 (two) times daily. 07/24/19 02/24/21  Regalado, Belkys A, MD  meloxicam (MOBIC) 15 MG tablet as needed.    [provider]  nitroGLYCERIN (NITROSTAT) 0.4 MG SL tablet Place 1 tablet (0.4 mg total) under the tongue every 5 (five) minutes as needed for chest pain. 11/06/19   [name] T, PA-C  omeprazole (PRILOSEC) 40 MG capsule Take 40  mg by mouth 2 (two) times daily.  11/03/13   [provider]  ondansetron (ZOFRAN-ODT) 4 MG disintegrating tablet Take 4 mg by mouth every 4 (four) hours as needed for nausea or vomiting.  09/16/19   [provider]  oxybutynin (DITROPAN) 5 MG tablet Take 5 mg by mouth at bedtime. 08/25/20   [provider]  oxyCODONE-acetaminophen (PERCOCET) 5-325 MG tablet Take 1 tablet by mouth every 4 (four) hours as needed (max 6 q for post op pain). 06/14/19   Shuford, Olivia Mackie, PA-C  polyethylene glycol (MIRALAX / GLYCOLAX) 17 g packet Take 17 g by mouth 3 (three) times a week.    [provider]  rosuvastatin (CRESTOR) 10 MG tablet Take 1 tablet (10 mg total) by mouth 3 (three) times a week. Monday, Wednesday and Friday. 06/03/20   Sherren Mocha, MD  sertraline (ZOLOFT) 25 MG tablet Take 25 mg by mouth at bedtime.    [provider]  traMADol (ULTRAM) 50 MG tablet Take 1 tablet po q 8 hours prn-caution sedation 01/12/21   [provider]  Vibegron (GEMTESA) 75 MG TABS Take 75 mg by mouth daily.    [provider]  Vitamin D, Ergocalciferol, (DRISDOL) 50000 units CAPS capsule Take 50,000 Units by mouth every Friday.     [provider]      Allergies    Levaquin [levofloxacin], Sulfonamide derivatives, Oxycodone, and Morphine    Review of Systems   Review of Systems  All other systems reviewed and are negative.   Physical Exam Updated Vital Signs BP (!) 161/79   Pulse 64   Temp 97.6 F (36.4 C) (Oral)   Resp 14   Ht '5\' 3"'$  (1.6 m)   Wt 54.4 kg   LMP 02/22/1983 (Approximate)   SpO2 97%   BMI 21.26 kg/m  Physical Exam Vitals and nursing note reviewed.  Constitutional:      Appearance: She is well-developed.  HENT:     Head: Atraumatic.  Cardiovascular:     Rate and Rhythm: Normal rate.  Pulmonary:     Effort: Pulmonary effort is normal.  Musculoskeletal:     Cervical back: Normal range of motion and neck supple.  Skin:     General: Skin is warm and dry.  Neurological:     Mental Status: She is alert and oriented to person, place, and time.     ED Results / Procedures / Treatments   Labs (all labs ordered are listed, but only abnormal results are displayed) Labs Reviewed  BASIC METABOLIC PANEL - Abnormal; Notable for the following components:      Result Value   CO2 19 (*)    Glucose, Bld 103 (*)    Calcium 8.1 (*)    All other components within normal limits  CBC - Abnormal; Notable for the following components:   WBC 11.1 (*)  RBC 3.81 (*)    Hemoglobin 11.4 (*)    All other components within normal limits  TROPONIN I (HIGH SENSITIVITY) - Abnormal; Notable for the following components:   Troponin I (High Sensitivity) 23 (*)    All other components within normal limits  CBG MONITORING, ED  TROPONIN I (HIGH SENSITIVITY)    EKG EKG Interpretation  Date/Time:  Saturday August 07 2021 15:32:06 EDT Ventricular Rate:  56 PR Interval:  144 QRS Duration: 97 QT Interval:  439 QTC Calculation: 424 R Axis:   -12 Text Interpretation: Sinus rhythm Probable anteroseptal infarct, old No acute changes No significant change since last tracing Confirmed by Varney Biles [id number]) on 08/07/2021 3:38:04 PM  Radiology DG Chest Portable 1 View  Result Date: 08/07/2021 CLINICAL DATA:  Chest pain and shortness of breath since yesterday. EXAM: PORTABLE CHEST 1 VIEW COMPARISON:  06/16/2020 FINDINGS: Patient is slightly rotated to the left. Lungs are adequately inflated without focal airspace consolidation. Mild stable blunting of the right costophrenic angle likely due in part to the degree of rotation. Cardiomediastinal silhouette and remainder of the exam is unchanged. IMPRESSION: No acute cardiopulmonary disease. Electronically Signed   By: Marin Olp M.D.   On: 08/07/2021 16:28    Procedures .Critical Care  Performed by: Varney Biles, MD Authorized by: Varney Biles, MD   Critical care provider  statement:    Critical care time (minutes):  38   Critical care was necessary to treat or prevent imminent or life-threatening deterioration of the following conditions:  Cardiac failure   Critical care was time spent personally by me on the following activities:  Development of treatment plan with patient or surrogate, discussions with consultants, evaluation of patient's response to treatment, examination of patient, ordering and review of laboratory studies, ordering and review of radiographic studies, ordering and performing treatments and interventions, pulse oximetry, re-evaluation of patient's condition and review of old charts     Medications Ordered in ED Medications  nitroGLYCERIN (NITROGLYN) 2 % ointment 1 inch (has no administration in time range)    ED Course/ Medical Decision Making/ A&P Clinical Course as of 08/07/21 1750  Sat Aug 07, 2021  1602 6/22 stres test:  The left ventricular ejection fraction is hyperdynamic (>65%).  Nuclear stress EF: 72%.  There was no ST segment deviation noted during stress.  No T wave inversion was noted during stress.  The study is normal.  This is a low risk study.  [AN]  1746 Initial high-sensitivity troponin is slightly elevated. Patient reassessed.  States that she still has some chest heaviness, although it is not as severe as it was earlier.  Again, unclear if the chest pain earlier was because she was in RVR and was unaware, or if there was a primary ischemic event.  Given the persistent pain, I will consult cardiology service. [AN]  [id number] Cardiology service will see the patient.  They've request medicine admission.  Holding heparin until repeat troponin is completed.  Patient already has received full dose aspirin. [AN]    Clinical Course User Index [AN] Varney Biles, MD           HEART Score: 6                Medical Decision Making Amount and/or Complexity of Data Reviewed Labs: ordered. Radiology:  ordered.  Risk Prescription drug management. Decision regarding hospitalization.  This patient presents to the ED with chief complaint(s) of chest pain with pertinent past medical history of coronary  artery disease, sarcoidosis, A-fib which further complicates the presenting complaint. The complaint involves an extensive differential diagnosis and also carries with it a high risk of complications and morbidity.    The differential diagnosis includes : ACS syndrome CHF exacerbation or pulmonary hypertension Valvular disorder Myocarditis Pericarditis Pericardial effusion / tamponade Pneumonia Pleural effusion / Pulmonary edema PE Pneumothorax Musculoskeletal pain PUD / Gastritis / Esophagitis Esophageal spasm Atrial fibrillation with RVR  The initial plan is to order basic labs, chest x-ray and reassess.  Additional history obtained: Additional history obtained from family Records reviewed  cardiology records including stress test, recent cardiology visit in 2023 and cath From few years back  Independent labs interpretation:  The following labs were independently interpreted: Slightly elevated high-sensitivity troponin, no evidence of severe anemia, baseline renal function  Independent visualization of imaging: - I independently visualized the following imaging with scope of interpretation limited to determining acute life threatening conditions related to emergency care: X-ray of the chest, which revealed no evidence of acute volume overload or any pneumonia/pleural effusion   Consultation: - Consulted or discussed management/test interpretation w/ external professional: Cardiology service   Final Clinical Impression(s) / ED Diagnoses Final diagnoses:  Elevated troponin  Angina pectoris [facility])    Rx / DC Orders ED Discharge Orders     None         Varney Biles, MD 08/07/21 1750

## 2021-08-07 NOTE — Consult Note (Signed)
CARDIOLOGY CONSULT NOTE  Patient ID: [name] MRN: [id number] DOB/AGE: October 17, 1934 87 y.o.  Admit date: 08/07/2021 Primary Cardiologist: Sherren Mocha, MD Chief Complaint : Chest discomfort; NSTEMI Requesting: ED  HPI:  [name] is a 87 y.o. female with known CAD (without previous coronary intervention), carotid artery disease, HTN, HLD, pAF/pSVT, and pulmonary sarcoidosis who presents to the ED for evaluation of chest pain and was found to have an NSTEMI.   Patient was in her USOH until a couple of days ago when she reports the onset of dull chest pressure with associated shortness of breath. She describes the sensation of a heaviness sitting on her chest that is worsened with activity. She does have some dyspnea at baseline, but states that this is more pronounced and different. She cannot clearly identify any exacerbating or relieving factors. She is otherwise comfortable and denies any paroxysmal nocturnal dyspnea/orthopnea, irregular heart beat/palpitations, nausea/vomiting/diarrhea presyncope/syncope, lower extremity edema, claudication, or abdominal distention. There are no clear localizing signs or symptoms of infection.     Past Medical History:  Diagnosis Date   Acute maxillary sinusitis 10/17/2017   Acute renal failure syndrome ([place]) 01/04/2019   Allergy    SEASONAL   Anemia    Arthritis    Asthma    "slight"    Baker's cyst of knee, right    CAD (coronary artery disease)    a. Myoview 10/15 - normal EF 70% // Myoview 11/16: EF 75%, normal perfusion, low risk // c. LHC 3/17 - LAD irregs, oLCx 70 (neg FFR), mRCA 30, EF 55-65% >> med Rx // Myoview 07/2018:  EF 73, extracardiac uptake, no significant ischemia (reviewed with Dr. Burt Knack), Low Risk // Myoview 6/22: EF 72, normal perfusion, low risk   Carotid stenosis    a. Carotid US 9/15 - Bilateral 1-39% ICA >> FU 2 years // b. Bilateral ICA 1-39 >> FU prn // Carotid US 06/2018: bilat 1-39; fu prn   Dyspnea     due to Sarcoidosis. When is windy or humed   Dysrhythmia    fast heart rate   Elbow fracture 04/2017   Right, had surgery   Esophageal reflux    Fracture of inferior pubic ramus ([place]) 02/28/2019   Hematochezia    Hip fracture ([place]) 12/12/2018   History of echocardiogram    a. Echo 10/15 - EF 60-65%, no RWMA   HLD (hyperlipidemia)    Hx of colonoscopy    Macular pucker, right eye    will have removed on 01/26/21   Osteoporosis    Other chronic pulmonary heart diseases    Paroxysmal supraventricular tachycardia ([place])    Pneumonia    Pneumonia 09/18/2019   Pre-op evaluation 06/11/2019   Pyelonephritis 07/29/2019   Sacral insufficiency fracture 12/12/2018   Sarcoidosis    Sepsis ([place]) 07/30/2019   Sepsis due to Escherichia coli (e. 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   COLONOSCOPY     CYSTOSCOPY W/ RETROGRADES Left 07/30/2019   Procedure: CYSTOSCOPY WITH RETROGRADE PYELOGRAM LEFT STENT  PLACEMENT;  Surgeon: Ardis Hughs, MD;  Location: [facility];  Service: Urology;  Laterality: Left;   CYSTOSCOPY WITH RETROGRADE PYELOGRAM, URETEROSCOPY AND STENT PLACEMENT Left 08/20/2019   Procedure: CYSTOSCOPY, URETEROSCOPY AND STENT PLACEMENT;  Surgeon: Robley Fries, MD;  Location: [facility];  Service: Urology;  Laterality: Left;  1 HR   FOOT SURGERY Right    HIP SURGERY Right 2011   full replacement   HOLMIUM LASER APPLICATION Left 00/93/8182   Procedure: HOLMIUM LASER APPLICATION;  Surgeon: Robley Fries, MD;  Location: [facility];  Service: Urology;  Laterality: Left;    LEG SURGERY Left 06/2012   femur fracture s/p open and closed reduction in [place], Dr. Jimmye Norman   lens replacement Right    right eye   ORIF ELBOW FRACTURE Right 05/09/2017   Procedure: ORIF right olecranon fracture with repair/reconstruction, ulnar nerve transposition as needed;  Surgeon: Roseanne Kaufman, MD;  Location: [facility];  Service: Orthopedics;  Laterality: Right;  Requests 90 mins   pelvis fracture     POLYPECTOMY     REVERSE SHOULDER ARTHROPLASTY Left 06/13/2019   Procedure: REVERSE SHOULDER ARTHROPLASTY;  Surgeon: Justice Britain, MD;  Location: [facility];  Service: Orthopedics;  Laterality: Left;  155mn   SHOULDER ARTHROSCOPY W/ ROTATOR CUFF REPAIR Right    TRAPEZIUM RESECTION Right     Allergies  Allergen Reactions   Levaquin [Levofloxacin] Other (See Comments)    Leg pain ..Marland KitchenPer doctor not to take again   Sulfonamide Derivatives     Pt unsure   Oxycodone Nausea Only    Can take oxycodone with Zofran   Morphine Nausea Only   Medications Prior to Admission  Medication Sig Dispense Refill Last Dose   acetaminophen (TYLENOL) 500 MG tablet Take 1,000 mg by mouth every 6 (six) hours as needed for mild pain.       albuterol (VENTOLIN HFA) 108 (90 Base) MCG/ACT inhaler Inhale 2 puffs into the lungs every 4 (four) hours as needed for wheezing or shortness of breath. 6.7 g 1    apixaban (ELIQUIS) 2.5 MG TABS tablet TAKE 1 TABLET(2.5 MG) BY MOUTH TWICE DAILY 180 tablet 1    budesonide-formoterol (SYMBICORT) 160-4.5 MCG/ACT inhaler Symbicort 160 mcg-4.5 mcg/actuation HFA aerosol inhaler  INHALE 2 PUFFS INTO THE LUNGS EVERY MORNING AND EVERY NIGHT AT BEDTIME      calcium carbonate (TUMS - DOSED IN MG ELEMENTAL CALCIUM) 500 MG chewable tablet Chew 1 tablet by mouth daily as needed for indigestion.       cetirizine (ZYRTEC) 10 MG tablet Take 1 tablet (10 mg total) by mouth daily. 30 tablet 1    denosumab (PROLIA) 60 MG/ML SOLN injection Inject 60 mg into the skin every 6 (six) months.  Administer in upper arm, thigh, or abdomen      dextromethorphan (DELSYM) 30 MG/5ML liquid Take 30 mg by mouth 2 (two) times daily as needed for cough.      diltiazem (CARDIZEM CD) 180 MG 24 hr capsule Take 180 mg by mouth at bedtime.       docusate sodium (COLACE) 50 MG capsule Take 100 mg by mouth at bedtime.       donepezil (ARICEPT) 5 MG tablet TAKE 1 TABLET(5 MG) BY MOUTH AT BEDTIME 90 tablet 1    fluticasone (FLONASE) 50 MCG/ACT nasal spray Place 2 sprays into both nostrils 2 (two) times daily. 16 g 1    furosemide (LASIX) 20 MG tablet Take 1 tablet (20 mg total) by mouth  daily. (Patient taking differently: Take 20 mg by mouth daily as needed.) 90 tablet 3    ipratropium (ATROVENT HFA) 17 MCG/ACT inhaler Inhale 1 puff into the lungs 2 (two) times daily. 1 Inhaler 2    meloxicam (MOBIC) 15 MG tablet as needed.      nitroGLYCERIN (NITROSTAT) 0.4 MG SL tablet Place 1 tablet (0.4 mg total) under the tongue every 5 (five) minutes as needed for chest pain. 25 tablet 3    omeprazole (PRILOSEC) 40 MG capsule Take 40 mg by mouth 2 (two) times daily.       ondansetron (ZOFRAN-ODT) 4 MG disintegrating tablet Take 4 mg by mouth every 4 (four) hours as needed for nausea or vomiting.       oxybutynin (DITROPAN) 5 MG tablet Take 5 mg by mouth at bedtime.      polyethylene glycol (MIRALAX / GLYCOLAX) 17 g packet Take 17 g by mouth 3 (three) times a week.      rosuvastatin (CRESTOR) 10 MG tablet Take 1 tablet (10 mg total) by mouth 3 (three) times a week. Monday, Wednesday and Friday. 45 tablet 2    sertraline (ZOLOFT) 25 MG tablet Take 25 mg by mouth at bedtime.      traMADol (ULTRAM) 50 MG tablet Take 1 tablet po q 8 hours prn-caution sedation      Vibegron (GEMTESA) 75 MG TABS Take 75 mg by mouth daily.      Vitamin D, Ergocalciferol, (DRISDOL) 50000 units CAPS capsule Take 50,000 Units by mouth every Friday.       Family History  Problem Relation Age of Onset   Colon cancer Mother 43   Cancer Mother     Lung cancer Father    Heart disease Father    Arrhythmia Father    Cancer Father    Cancer Sister        ovarian   Heart attack Brother    Esophageal cancer Neg Hx    Rectal cancer Neg Hx    Stomach cancer Neg Hx    Stroke Neg Hx     Social History   Socioeconomic History   Marital status: Widowed    Spouse name: Not on file   Number of children: 1   Years of education: Not on file   Highest education level: Some college, no degree  Occupational History   Occupation: retired    Comment: still works as Scientist, 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  Tobacco Use   Smoking status: Never   Smokeless tobacco: Never  Vaping Use   Vaping Use: Never used  Substance and Sexual Activity   Alcohol use: No   Drug use: No   Sexual activity: Not Currently    Partners: Male    Birth control/protection: Post-menopausal  Other Topics Concern   Not on file  Social History Narrative   Lives with daughter and son in law   decaffeinated  use:  Coffee 1 per day   Right handed   Social Determinants of Health   Financial Resource Strain: Not on file  Food Insecurity: Not on file  Transportation Needs: Not on file  Physical Activity: Not on file  Stress: Not on file  Social Connections: Not on file  Intimate Partner Violence: Not on file     Review of Systems: [y] = yes, '[ ]'$  = no      General: Weight gain '[]'$ ; Weight loss '[ ]'$ ; Anorexia '[ ]'$ ; Fatigue '[ ]'$ ; Fever '[ ]'$ ; Chills '[ ]'$ ;  Weakness '[ ]'$    Cardiac: Chest pain/pressure [ x]; Resting SOB '[ ]'$ ; Exertional SOB [ x]; Orthopnea '[ ]'$ ; Pedal Edema '[ ]'$ ; Palpitations '[ ]'$ ; Syncope '[ ]'$ ; Presyncope '[ ]'$ ; Paroxysmal nocturnal dyspnea'[ ]'$    Pulmonary: Cough '[ ]'$ ; Wheezing'[ ]'$ ; Hemoptysis'[ ]'$ ; Sputum '[ ]'$ ; Snoring '[ ]'$    GI: Vomiting'[ ]'$ ; Dysphagia'[ ]'$ ; Melena'[ ]'$ ; Hematochezia '[ ]'$ ; Heartburn'[ ]'$ ; Abdominal pain '[ ]'$ ; Constipation '[ ]'$ ; Diarrhea '[ ]'$ ; BRBPR '[ ]'$    GU: Hematuria'[ ]'$ ; Dysuria '[ ]'$ ; Nocturia'[ ]'$  Vascular: Pain in legs with walking '[ ]'$ ; Pain in feet with  lying flat '[ ]'$ ; Non-healing sores '[ ]'$ ; Stroke '[ ]'$ ; TIA '[ ]'$ ; Slurred speech '[ ]'$ ;   Neuro: Headaches'[ ]'$ ; Vertigo'[ ]'$ ; Seizures'[ ]'$ ; Paresthesias'[ ]'$ ;Blurred vision '[ ]'$ ; Diplopia '[ ]'$ ; Vision changes '[ ]'$    Ortho/Skin: Arthritis '[ ]'$ ; Joint pain '[ ]'$ ; Muscle pain '[ ]'$ ; Joint swelling '[ ]'$ ; Back Pain '[ ]'$ ; Rash '[ ]'$    Psych: Depression'[ ]'$ ; Anxiety'[ ]'$    Heme: Bleeding problems '[ ]'$ ; Clotting disorders '[ ]'$ ; Anemia '[ ]'$    Endocrine: Diabetes '[ ]'$ ; Thyroid dysfunction'[ ]'$   Physical Exam: Blood pressure (!) 141/60, pulse 70, temperature 97.6 F (36.4 C), temperature source Oral, resp. rate 13, height '5\' 3"'$  (1.6 m), weight 54.4 kg, last menstrual period 02/22/1983, SpO2 98 %.   GENERAL: Patient is afebrile, Vital signs reviewed, Well appearing, Patient appears comfortable, Alert and lucid. EYES: Normal inspection. HEENT:  normocephalic, atraumatic  ORAL:  Moist NECK:  supple , normal inspection. No JVD CARD:  regular rate and rhythm, heart sounds normal. RESP:  no respiratory distress, breath sounds normal. ABD: soft, NT/ND, BS present, no organomegaly or masses . BACK: non-tender. No CVA tenderness. MUSC:  normal ROM, non-tender , no pedal edema . SKIN: color normal, no rash, warm, dry . NEURO: No focal deficits PSYCH: mood/affect normal.  Labs: Lab Results  Component Value Date   BUN 18 08/07/2021   Lab Results  Component Value Date   CREATININE 0.83 08/07/2021   Lab Results  Component Value Date   NA 138 08/07/2021   K 3.7 08/07/2021   CL 108 08/07/2021   CO2 19 (L) 08/07/2021   Lab Results  Component Value Date   TROPONINI <0.03 04/29/2017   Lab Results  Component Value Date   WBC 11.1 (H) 08/07/2021   HGB 11.4 (L) 08/07/2021   HCT 36.1 08/07/2021   MCV 94.8 08/07/2021   PLT 243 08/07/2021   Lab Results  Component Value Date   CHOL 149 11/27/2017   HDL 73 11/27/2017   LDLCALC 63 11/27/2017   TRIG 67 11/27/2017   CHOLHDL 2.0 11/27/2017   Lab Results  Component Value Date    ALT 8 07/21/2020   AST 14 07/21/2020   ALKPHOS 95 07/21/2020   BILITOT 0.3 07/21/2020      Radiology:   CXR: No acute cardiopulmonary disease. EKG: NSR rhythm with no dynamic ST changes; stable from prior   ASSESSMENT AND PLAN:  [name] is a 87 y.o. female with known CAD (without previous coronary intervention), carotid artery disease, HTN, HLD, pAF/pSVT, and pulmonary sarcoidosis who presents to the ED for evaluation of chest pain and was found to have an NSTEMI.    # NSTEMI # Chest Discomfort # HTN - Plan for diagnostic LHC early this week when cath lab opens - Start heparin IV infusion protocol for ACS - Symptom management with Nitro patch and  oral analgesics prn - Trend trop and serial ECGs - TTE in AM - Start beta blockade; was previously not on any antihypertensive agents - Aggressive risk factor modification   - Continue home rosuvastatin             - Lipid panel and A1c pending     Signed: Clois Dupes 08/07/2021, 9:27 PM

## 2021-08-07 NOTE — Progress Notes (Signed)
ANTICOAGULATION CONSULT NOTE - Initial Consult  Pharmacy Consult for heparin Indication: chest pain/ACS  Allergies  Allergen Reactions   Levaquin [Levofloxacin] Other (See Comments)    Leg pain .Marland Kitchen Per doctor not to take again   Sulfonamide Derivatives     Pt unsure   Oxycodone Nausea Only    Can take oxycodone with Zofran   Morphine Nausea Only    Patient Measurements: Height: '5\' 3"'$  (160 cm) Weight: 54.4 kg (120 lb) IBW/kg (Calculated) : 52.4 Heparin Dosing Weight: 54.4 kg  Vital Signs: Temp: 97.6 F (36.4 C) (06/17 1534) Temp Source: Oral (06/17 1534) BP: 137/57 (06/17 2030) Pulse Rate: 58 (06/17 2030)  Labs: Recent Labs    08/07/21 1535 08/07/21 1916  HGB 11.4*  --   HCT 36.1  --   PLT 243  --   CREATININE 0.83  --   TROPONINIHS 23* 33*    Estimated Creatinine Clearance: 41 mL/min (by C-G formula based on SCr of 0.83 mg/dL).   Medical History: Past Medical History:  Diagnosis Date   Acute maxillary sinusitis 10/17/2017   Acute renal failure syndrome ([place]) 01/04/2019   Allergy    SEASONAL   Anemia    Arthritis    Asthma    "slight"    Baker's cyst of knee, right    CAD (coronary artery disease)    a. Myoview 10/15 - normal EF 70% // Myoview 11/16: EF 75%, normal perfusion, low risk // c. LHC 3/17 - LAD irregs, oLCx 70 (neg FFR), mRCA 30, EF 55-65% >> med Rx // Myoview 07/2018:  EF 73, extracardiac uptake, no significant ischemia (reviewed with Dr. Burt Knack), Low Risk // Myoview 6/22: EF 72, normal perfusion, low risk   Carotid stenosis    a. Carotid US 9/15 - Bilateral 1-39% ICA >> FU 2 years // b. Bilateral ICA 1-39 >> FU prn // Carotid US 06/2018: bilat 1-39; fu prn   Dyspnea    due to Sarcoidosis. When is windy or humed   Dysrhythmia    fast heart rate   Elbow fracture 04/2017   Right, had surgery   Esophageal reflux    Fracture of inferior pubic ramus ([place]) 02/28/2019   Hematochezia    Hip fracture ([place]) 12/12/2018   History of echocardiogram    a.  Echo 10/15 - EF 60-65%, no RWMA   HLD (hyperlipidemia)    Hx of colonoscopy    Macular pucker, right eye    will have removed on 01/26/21   Osteoporosis    Other chronic pulmonary heart diseases    Paroxysmal supraventricular tachycardia ([place])    Pneumonia    Pneumonia 09/18/2019   Pre-op evaluation 06/11/2019   Pyelonephritis 07/29/2019   Sacral insufficiency fracture 12/12/2018   Sarcoidosis    Sepsis ([place]) 07/30/2019   Sepsis due to Escherichia coli (e. coli) ([place]) 07/29/2019   Sepsis due to urinary tract infection ([place]) 07/18/2019    Medications:  Medications Prior to Admission  Medication Sig Dispense Refill Last Dose   acetaminophen (TYLENOL) 500 MG tablet Take 1,000 mg by mouth every 6 (six) hours as needed for mild pain.       albuterol (VENTOLIN HFA) 108 (90 Base) MCG/ACT inhaler Inhale 2 puffs into the lungs every 4 (four) hours as needed for wheezing or shortness of breath. 6.7 g 1    apixaban (ELIQUIS) 2.5 MG TABS tablet TAKE 1 TABLET(2.5 MG) BY MOUTH TWICE DAILY 180 tablet 1    budesonide-formoterol (SYMBICORT) 160-4.5 MCG/ACT inhaler  Symbicort 160 mcg-4.5 mcg/actuation HFA aerosol inhaler  INHALE 2 PUFFS INTO THE LUNGS EVERY MORNING AND EVERY NIGHT AT BEDTIME      calcium carbonate (TUMS - DOSED IN MG ELEMENTAL CALCIUM) 500 MG chewable tablet Chew 1 tablet by mouth daily as needed for indigestion.       cetirizine (ZYRTEC) 10 MG tablet Take 1 tablet (10 mg total) by mouth daily. 30 tablet 1    denosumab (PROLIA) 60 MG/ML SOLN injection Inject 60 mg into the skin every 6 (six) months. Administer in upper arm, thigh, or abdomen      dextromethorphan (DELSYM) 30 MG/5ML liquid Take 30 mg by mouth 2 (two) times daily as needed for cough.      diltiazem (CARDIZEM CD) 180 MG 24 hr capsule Take 180 mg by mouth at bedtime.       docusate sodium (COLACE) 50 MG capsule Take 100 mg by mouth at bedtime.       donepezil (ARICEPT) 5 MG tablet TAKE 1 TABLET(5 MG) BY MOUTH AT BEDTIME 90 tablet 1     fluticasone (FLONASE) 50 MCG/ACT nasal spray Place 2 sprays into both nostrils 2 (two) times daily. 16 g 1    furosemide (LASIX) 20 MG tablet Take 1 tablet (20 mg total) by mouth daily. (Patient taking differently: Take 20 mg by mouth daily as needed.) 90 tablet 3    ipratropium (ATROVENT HFA) 17 MCG/ACT inhaler Inhale 1 puff into the lungs 2 (two) times daily. 1 Inhaler 2    meloxicam (MOBIC) 15 MG tablet as needed.      nitroGLYCERIN (NITROSTAT) 0.4 MG SL tablet Place 1 tablet (0.4 mg total) under the tongue every 5 (five) minutes as needed for chest pain. 25 tablet 3    omeprazole (PRILOSEC) 40 MG capsule Take 40 mg by mouth 2 (two) times daily.       ondansetron (ZOFRAN-ODT) 4 MG disintegrating tablet Take 4 mg by mouth every 4 (four) hours as needed for nausea or vomiting.       oxybutynin (DITROPAN) 5 MG tablet Take 5 mg by mouth at bedtime.      polyethylene glycol (MIRALAX / GLYCOLAX) 17 g packet Take 17 g by mouth 3 (three) times a week.      rosuvastatin (CRESTOR) 10 MG tablet Take 1 tablet (10 mg total) by mouth 3 (three) times a week. Monday, Wednesday and Friday. 45 tablet 2    sertraline (ZOLOFT) 25 MG tablet Take 25 mg by mouth at bedtime.      traMADol (ULTRAM) 50 MG tablet Take 1 tablet po q 8 hours prn-caution sedation      Vibegron (GEMTESA) 75 MG TABS Take 75 mg by mouth daily.      Vitamin D, Ergocalciferol, (DRISDOL) 50000 units CAPS capsule Take 50,000 Units by mouth every Friday.        Assessment: [name] is a 87 y.o. female with PMH that includes paroxysmal SVT, CAD, HTN, HLD, CAD, COPD, atrial fibrillation, anemia, and CHF.  On apixaban 2.5 mg BID PTA for afib. Unsure when last was taken. Planning for cath and TTE.  Of note, patient had recent procedure (lumbar ESI injection), that apixaban was to be held for 3 days prior without bridging. Date of procedure was 6/13. Also, 6/15 office visit for SOB and chest tightness, increased cough and sputum production.  Plan was to treat as COPD exacerbation with Cefdinir x 7 days and prednisone x 5 days.   Goal of Therapy:  Heparin level 0.3-0.7 units/ml Monitor platelets by anticoagulation protocol: Yes   Plan:  Baseline heparin level and aPTT Start heparin infusion at 650 units/hr (ideally 12 hours after last dose of apixaban) Check aPTT 8 hours after infusion initiation Check heparin level and aPTT daily until correlating Continue to monitor H&H and platelets   Thank you for allowing [place] to participate in this patients care. Jens Som, PharmD 08/07/2021 9:12 PM  **Pharmacist phone directory can be found on [email or web address] listed under [place]**

## 2021-08-07 NOTE — ED Triage Notes (Signed)
Pt arrives to ED BIB [facility] due to CP and [facility] since yesterday with no relief. Per EMS pt was c/o "heavy pressure" on the center of her chest. EMS administered 3 Nitro Sublingual and '324mg'$  Aspirin with relief. Hx of Afib and is on Eliquis and Cardizem. Pt A/O x4.  BP 126/70 HR 62 O2 96% RA

## 2021-08-07 NOTE — H&P (Signed)
History and Physical   [name] RKY:[id number] DOB: 06/10/1934 DOA: 08/07/2021  PCP: [name], MD   Patient coming from: Home  Chief Complaint: Chest Pain  HPI: [name] is a 87 y.o. female with medical history significant of paroxysmal SVT, CAD, hypertension, hyperlipidemia, carotid artery disease, COPD, sarcoid, bronchiectasis, GERD, low back pain, atrial fibrillation, anemia, CHF, cognitive decline, low compliance bladder presenting with chest pain.  Patient reports 2 days of chest pain.  Initially started feeling unwell yesterday has had increasing shortness of breath and chest heaviness.  Does have some shortness of breath intermittently at baseline but now much more significant and occurs even with prolonged speaking.  Chest pain is described as a heaviness that is intermittent without specific triggers that she has noticed.  Is associated with shortness of breath when it occurs.  States she tried nitro glycerin today and did have relief.  EMS was called and noted at least 30 seconds of tachycardia on the monitor.  Patient is unsure if this is similar to prior cardiac chest pain.  She denies fevers, chills, abdominal pain, constipation, diarrhea, nausea, vomiting.  ED Course: Vital signs in the ED significant for heart rate in the 50s to 60s, respiratory rate in the teens to 20s, blood pressure in the 315V to 761 systolic.  Lab work-up included BMP with bicarb 19, glucose 23, calcium 8.1.  CBC with mild leukocytosis to 11.1 and hemoglobin 11.4 from baseline of 12-13.  Troponin initially mildly elevated at 23 with repeat pending.  Chest x-ray showed no acute abnormality.  Patient received Nitropaste in the ED.  Cardiology was consulted and will see the patient in requested medicine admission.  Recommended to hold off on heparin for now unless patient has significant rise in troponins overnight.  Review of Systems: As per HPI otherwise all other systems reviewed and are  negative.  Past Medical History:  Diagnosis Date   Acute maxillary sinusitis 10/17/2017   Acute renal failure syndrome ([place]) 01/04/2019   Allergy    SEASONAL   Anemia    Arthritis    Asthma    "slight"    Baker's cyst of knee, right    CAD (coronary artery disease)    a. Myoview 10/15 - normal EF 70% // Myoview 11/16: EF 75%, normal perfusion, low risk // c. LHC 3/17 - LAD irregs, oLCx 70 (neg FFR), mRCA 30, EF 55-65% >> med Rx // Myoview 07/2018:  EF 73, extracardiac uptake, no significant ischemia (reviewed with Dr. Burt Knack), Low Risk // Myoview 6/22: EF 72, normal perfusion, low risk   Carotid stenosis    a. Carotid US 9/15 - Bilateral 1-39% ICA >> FU 2 years // b. Bilateral ICA 1-39 >> FU prn // Carotid US 06/2018: bilat 1-39; fu prn   Dyspnea    due to Sarcoidosis. When is windy or humed   Dysrhythmia    fast heart rate   Elbow fracture 04/2017   Right, had surgery   Esophageal reflux    Fracture of inferior pubic ramus ([place]) 02/28/2019   Hematochezia    Hip fracture ([place]) 12/12/2018   History of echocardiogram    a. Echo 10/15 - EF 60-65%, no RWMA   HLD (hyperlipidemia)    Hx of colonoscopy    Macular pucker, right eye    will have removed on 01/26/21   Osteoporosis    Other chronic pulmonary heart diseases    Paroxysmal supraventricular tachycardia (HCC)    Pneumonia  Pneumonia 09/18/2019   Pre-op evaluation 06/11/2019   Pyelonephritis 07/29/2019   Sacral insufficiency fracture 12/12/2018   Sarcoidosis    Sepsis ([place]) 07/30/2019   Sepsis due to Escherichia coli (e. 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   COLONOSCOPY     CYSTOSCOPY W/ RETROGRADES Left 07/30/2019   Procedure: CYSTOSCOPY WITH RETROGRADE PYELOGRAM LEFT STENT PLACEMENT;  Surgeon: Ardis Hughs, MD;  Location: [facility];  Service: Urology;  Laterality: Left;   CYSTOSCOPY WITH RETROGRADE PYELOGRAM, URETEROSCOPY AND STENT PLACEMENT Left 08/20/2019   Procedure: CYSTOSCOPY, URETEROSCOPY AND STENT PLACEMENT;  Surgeon: Robley Fries, MD;  Location: [facility];  Service: Urology;  Laterality: Left;  1 HR   FOOT SURGERY Right    HIP SURGERY Right 2011   full replacement   HOLMIUM LASER APPLICATION Left 09/60/4540   Procedure: HOLMIUM LASER APPLICATION;  Surgeon: Robley Fries, MD;  Location: [facility];  Service: Urology;  Laterality: Left;   LEG SURGERY Left 06/2012   femur fracture s/p open and closed reduction in [place], Dr. Jimmye Norman   lens replacement Right    right eye   ORIF ELBOW FRACTURE Right 05/09/2017   Procedure: ORIF right olecranon fracture with repair/reconstruction, ulnar nerve transposition as needed;  Surgeon: Roseanne Kaufman, MD;  Location: [facility];  Service: Orthopedics;  Laterality: Right;  Requests 90 mins   pelvis fracture     POLYPECTOMY     REVERSE SHOULDER ARTHROPLASTY Left 06/13/2019   Procedure: REVERSE SHOULDER ARTHROPLASTY;  Surgeon: Justice Britain, MD;  Location: [facility];  Service: Orthopedics;  Laterality: Left;  129mn   SHOULDER ARTHROSCOPY W/ ROTATOR CUFF REPAIR Right    TRAPEZIUM RESECTION Right     Social History  reports that she has never smoked. She has never used smokeless tobacco. She reports that she does not drink alcohol and does not use drugs.  Allergies  Allergen Reactions   Levaquin [Levofloxacin] Other (See Comments)    Leg pain ..Marland KitchenPer doctor not to take again   Sulfonamide Derivatives     Pt unsure   Oxycodone Nausea Only    Can take oxycodone with Zofran   Morphine Nausea Only    Family History  Problem Relation  Age of Onset   Colon cancer Mother [phone]  Cancer Mother    Lung cancer Father    Heart disease Father    Arrhythmia Father    Cancer Father    Cancer Sister        ovarian   Heart attack Brother    Esophageal cancer Neg Hx    Rectal cancer Neg Hx    Stomach cancer Neg Hx    Stroke Neg Hx   Reviewed on admission  Prior to Admission medications   Medication Sig Start Date End Date Taking? Authorizing Provider  acetaminophen (TYLENOL) 500 MG tablet Take 1,000 mg by mouth every 6 (six) hours as needed for mild pain.     [provider]  albuterol (VENTOLIN HFA) 108 (90 Base) MCG/ACT inhaler Inhale 2 puffs into  the lungs every 4 (four) hours as needed for wheezing or shortness of breath. 06/16/20   Collene Gobble, MD  amoxicillin-clavulanate (AUGMENTIN) 875-125 MG tablet Take 1 tablet by mouth 2 (two) times daily. Patient not taking: Reported on 05/28/2021 03/17/21   Salvadore Dom, MD  apixaban (ELIQUIS) 2.5 MG TABS tablet TAKE 1 TABLET(2.5 MG) BY MOUTH TWICE DAILY 04/21/21   Sherren Mocha, MD  budesonide-formoterol [facility]) 160-4.5 MCG/ACT inhaler Symbicort 160 mcg-4.5 mcg/actuation HFA aerosol inhaler  INHALE 2 PUFFS INTO THE LUNGS EVERY MORNING AND EVERY NIGHT AT BEDTIME    [provider]  calcium carbonate (TUMS - DOSED IN MG ELEMENTAL CALCIUM) 500 MG chewable tablet Chew 1 tablet by mouth daily as needed for indigestion.     [provider]  cetirizine (ZYRTEC) 10 MG tablet Take 1 tablet (10 mg total) by mouth daily. 06/16/20   Collene Gobble, MD  denosumab (PROLIA) 60 MG/ML SOLN injection Inject 60 mg into the skin every 6 (six) months. Administer in upper arm, thigh, or abdomen    [provider]  dextromethorphan (DELSYM) 30 MG/5ML liquid Take 30 mg by mouth 2 (two) times daily as needed for cough.    [provider]  diltiazem (CARDIZEM CD) 180 MG 24 hr capsule Take 180 mg by mouth at bedtime.  05/08/19   [provider]   docusate sodium (COLACE) 50 MG capsule Take 100 mg by mouth at bedtime.     [provider]  donepezil (ARICEPT) 5 MG tablet TAKE 1 TABLET(5 MG) BY MOUTH AT BEDTIME 03/24/21   Melvenia Beam, MD  fluticasone (FLONASE) 50 MCG/ACT nasal spray Place 2 sprays into both nostrils 2 (two) times daily. 06/16/20   Collene Gobble, MD  furosemide (LASIX) 20 MG tablet Take 1 tablet (20 mg total) by mouth daily. Patient taking differently: Take 20 mg by mouth daily as needed. 07/22/20   [name] T, PA-C  ipratropium (ATROVENT HFA) 17 MCG/ACT inhaler Inhale 1 puff into the lungs 2 (two) times daily. 07/24/19 02/24/21  Regalado, Belkys A, MD  meloxicam (MOBIC) 15 MG tablet as needed.    [provider]  nitroGLYCERIN (NITROSTAT) 0.4 MG SL tablet Place 1 tablet (0.4 mg total) under the tongue every 5 (five) minutes as needed for chest pain. 11/06/19   [name] T, PA-C  omeprazole (PRILOSEC) 40 MG capsule Take 40 mg by mouth 2 (two) times daily.  11/03/13   [provider]  ondansetron (ZOFRAN-ODT) 4 MG disintegrating tablet Take 4 mg by mouth every 4 (four) hours as needed for nausea or vomiting.  09/16/19   [provider]  oxybutynin (DITROPAN) 5 MG tablet Take 5 mg by mouth at bedtime. 08/25/20   [provider]  oxyCODONE-acetaminophen (PERCOCET) 5-325 MG tablet Take 1 tablet by mouth every 4 (four) hours as needed (max 6 q for post op pain). 06/14/19   Shuford, Olivia Mackie, PA-C  polyethylene glycol (MIRALAX / GLYCOLAX) 17 g packet Take 17 g by mouth 3 (three) times a week.    [provider]  rosuvastatin (CRESTOR) 10 MG tablet Take 1 tablet (10 mg total) by mouth 3 (three) times a week. Monday, Wednesday and Friday. 06/03/20   Sherren Mocha, MD  sertraline (ZOLOFT) 25 MG tablet Take 25 mg by mouth at bedtime.    [provider]  traMADol (ULTRAM) 50 MG tablet Take 1 tablet po q 8 hours prn-caution sedation 01/12/21   [provider]  Vibegron  (  GEMTESA) 75 MG TABS Take 75 mg by mouth daily.    [provider]  Vitamin D, Ergocalciferol, (DRISDOL) 50000 units CAPS capsule Take 50,000 Units by mouth every Friday.     [provider]    Physical Exam: Vitals:   08/07/21 1715 08/07/21 1730 08/07/21 1745 08/07/21 1800  BP: (!) 188/70  (!) 184/65 (!) 172/68  Pulse: (!) 54 (!) 58 (!) 54 (!) 58  Resp: '15 17 18 16  '$ Temp:      TempSrc:      SpO2: 99% 99% 99% 99%  Weight:      Height:        Physical Exam Constitutional:      General: She is not in acute distress.    Appearance: Normal appearance.  HENT:     Head: Normocephalic and atraumatic.     Mouth/Throat:     Mouth: Mucous membranes are moist.     Pharynx: Oropharynx is clear.  Eyes:     Extraocular Movements: Extraocular movements intact.     Pupils: Pupils are equal, round, and reactive to light.  Cardiovascular:     Rate and Rhythm: Normal rate and regular rhythm.     Pulses: Normal pulses.     Heart sounds: Normal heart sounds.  Pulmonary:     Effort: Pulmonary effort is normal. No respiratory distress.     Breath sounds: Normal breath sounds.  Abdominal:     General: Bowel sounds are normal. There is no distension.     Palpations: Abdomen is soft.     Tenderness: There is no abdominal tenderness.  Musculoskeletal:        General: No swelling or deformity.  Skin:    General: Skin is warm and dry.  Neurological:     General: No focal deficit present.     Mental Status: Mental status is at baseline.    Labs on Admission: I have personally reviewed following labs and imaging studies  CBC: Recent Labs  Lab 08/07/21 1535  WBC 11.1*  HGB 11.4*  HCT 36.1  MCV 94.8  PLT 621    Basic Metabolic Panel: Recent Labs  Lab 08/07/21 1535  NA 138  K 3.7  CL 108  CO2 19*  GLUCOSE 103*  BUN 18  CREATININE 0.83  CALCIUM 8.1*    GFR: Estimated Creatinine Clearance: 41 mL/min (by C-G formula based on SCr of 0.83 mg/dL).  Liver  Function Tests: No results for input(s): "AST", "ALT", "ALKPHOS", "BILITOT", "PROT", "ALBUMIN" in the last 168 hours.  Urine analysis:    Component Value Date/Time   COLORURINE STRAW (A) 09/18/2019 2100   APPEARANCEUR CLEAR 09/18/2019 2100   LABSPEC 1.034 (H) 09/18/2019 2100   PHURINE 7.0 09/18/2019 2100   GLUCOSEU NEGATIVE 09/18/2019 2100   HGBUR NEGATIVE 09/18/2019 2100   BILIRUBINUR NEGATIVE 09/18/2019 2100   BILIRUBINUR negative 03/21/2018 [place] 09/18/2019 2100   PROTEINUR NEGATIVE 09/18/2019 2100   UROBILINOGEN negative (A) 03/21/2018 1328   UROBILINOGEN 0.2 12/08/2008 1204   NITRITE NEGATIVE 09/18/2019 2100   LEUKOCYTESUR NEGATIVE 09/18/2019 2100    Radiological Exams on Admission: DG Chest Portable 1 View  Result Date: 08/07/2021 CLINICAL DATA:  Chest pain and shortness of breath since yesterday. EXAM: PORTABLE CHEST 1 VIEW COMPARISON:  06/16/2020 FINDINGS: Patient is slightly rotated to the left. Lungs are adequately inflated without focal airspace consolidation. Mild stable blunting of the right costophrenic angle likely due in part to the degree of rotation. Cardiomediastinal  silhouette and remainder of the exam is unchanged. IMPRESSION: No acute cardiopulmonary disease. Electronically Signed   By: Marin Olp M.D.   On: 08/07/2021 16:28    EKG: Independently reviewed.  Sinus rhythm at 56 bpm.  Nonspecific T wave flattening aVL.  Similar to previous.  Assessment/Plan Active Problems:   HLD (hyperlipidemia)   Paroxysmal supraventricular tachycardia (HCC)   GERD   CAD (coronary artery disease)   Essential hypertension   Carotid artery disease (HCC)   Bronchiectasis without complication (HCC)   Low back pain   Atrial fibrillation (HCC)   COPD (chronic obstructive pulmonary disease) (HCC)   Chronic heart failure with preserved ejection fraction (HCC)   Chest pain, rule out ACS CAD Hyperlipidemia > Patient presenting with chest pain for the  past 2 days described as a heaviness and with associated increase shortness of breath. > Known history of CAD without stenting with 1 lesion in the 60-70% occlusive range. > Initial troponin mildly elevated 23 with repeat pending. > Cardiology consulted in the ED he said hold off on heparin for now unless significant rise in troponin, they will see the patient while admitted. - Appreciate cardiology recommendations - Monitor on telemetry - Continue to trend troponin - Echocardiogram - Continue home rosuvastatin - Continue home nitro as needed for recurrent chest pain - EKG as needed for recurrent chest pain  Leukocytosis > Mild leukocytosis in the ED to 11.1.  Suspect to be reactive in the setting of chest pain as above.  Chest x-ray without acute abnormality, no urinary symptoms. - Continue to trend fever curve and WBC  CHF > Last echo in 2021 with EF 55-60%, G1 DD, normal RV function. > Prescribed Lasix to take as needed > With increased shortness of breath and concern for possible ACS as above we will also check BNP - Ins and outs, daily weights - Check BNP - Echocardiogram ordered as above  Paroxysmal SVT Atrial fibrillation > Known history of A-fib currently controlled on diltiazem.  Does have history of proximal SVT but no obvious history of recent recurrence per chart review. - Continue home diltiazem - Continue home Eliquis, can discontinue this if starts on heparin drip for ACS if troponin uptrending  COPD History of pulmonary sarcoid Bronchiectasis - Replace home Symbicort with formulary Dulera - Continue home as needed albuterol  GERD - Continue home PPI   Low back pain - Continue tramadol  Anemia > Hemoglobin somewhat down from baseline 11.4 with recent baseline being 12-13. - Continue to trend   Cognitive decline - Continue home donepezil  Low compliance bladder - Continue home oxybutynin and Vibegron  DVT prophylaxis: Eliquis for now Code Status:    Full Family Communication:  Updated at bedside  Disposition Plan:   Patient is from:  Home  Anticipated DC to:  Home  Anticipated DC date:  1 to 3 days  Anticipated DC barriers: None  Consults called:  Cardiology, consulted in the ED. Admission status:  Observation, telemetry  Severity of Illness: The appropriate patient status for this patient is OBSERVATION. Observation status is judged to be reasonable and necessary in order to provide the required intensity of service to ensure the patient's safety. The patient's presenting symptoms, physical exam findings, and initial radiographic and laboratory data in the context of their medical condition is felt to place them at decreased risk for further clinical deterioration. Furthermore, it is anticipated that the patient will be medically stable for discharge from the hospital within 2 midnights  of admission.    Marcelyn Bruins MD [facility]  How to contact the [facility] Attending or Consulting provider [place] or covering provider during after hours [place], for this patient?   Check the care team in [facility] and look for a) attending/consulting TR[facility] provider listed and b) the [facility] team listed Log into [email or web address] and use [place]'s universal password to access. If you do not have the password, please contact the hospital operator. Locate the [facility] provider you are looking for under [facility] and page to a number that you can be directly reached. If you still have difficulty reaching the provider, please page the [facility] (Director on Call) for the Hospitalists listed on amion for assistance.  08/07/2021, 6:22 PM

## 2021-08-07 NOTE — ED Notes (Signed)
ED TO INPATIENT HANDOFF REPORT  ED Nurse Name and Phone #: [phone] [name] Room/Bed: 007C/007C  Code Status   Code Status: Full Code  Home/SNF/Other Home Patient oriented to: self, place, time, and situation Is this baseline? Yes   Triage Complete: Triage complete  Chief Complaint Chest pain, rule out acute myocardial infarction [R07.9]  Triage Note Pt arrives to ED BIB [facility] due to CP and SHOB since yesterday with no relief. Per EMS pt was c/o "heavy pressure" on the center of her chest. EMS administered 3 Nitro Sublingual and '324mg'$  Aspirin with relief. Hx of Afib and is on Eliquis and Cardizem. Pt A/O x4.  BP 126/70 HR 62 O2 96% RA   Allergies Allergies  Allergen Reactions   Levaquin [Levofloxacin] Other (See Comments)    Leg pain .[place] Kitchen Per doctor not to take again   Sulfonamide Derivatives     Pt unsure   Oxycodone Nausea Only    Can take oxycodone with Zofran   Morphine Nausea Only    Level of Care/Admitting Diagnosis ED Disposition     ED Disposition  Admit   Condition  --   [place]: [place] [[id number]]  Level of Care: Telemetry Cardiac [103]  May place patient in observation at [facility] or [place] if equivalent level of care is available:: No  Covid Evaluation: Asymptomatic - no recent exposure (last 10 days) testing not required  Diagnosis: Chest pain, rule out acute myocardial infarction [[id number]]  Admitting Physician: Marcelyn Bruins [[id number]]  Attending Physician: Marcelyn Bruins [[id number]]          B Medical/Surgery History Past Medical History:  Diagnosis Date   Acute maxillary sinusitis 10/17/2017   Acute renal failure syndrome ([place]) 01/04/2019   Allergy    SEASONAL   Anemia    Arthritis    Asthma    "slight"    Baker's cyst of knee, right    CAD (coronary artery disease)    a. Myoview 10/15 - normal EF 70% // Myoview 11/16: EF 75%,  normal perfusion, low risk // c. LHC 3/17 - LAD irregs, oLCx 70 (neg FFR), mRCA 30, EF 55-65% >> med Rx // Myoview 07/2018:  EF 73, extracardiac uptake, no significant ischemia (reviewed with Dr. Burt Knack), Low Risk // Myoview 6/22: EF 72, normal perfusion, low risk   Carotid stenosis    a. Carotid US 9/15 - Bilateral 1-39% ICA >> FU 2 years // b. Bilateral ICA 1-39 >> FU prn // Carotid US 06/2018: bilat 1-39; fu prn   Dyspnea    due to Sarcoidosis. When is windy or humed   Dysrhythmia    fast heart rate   Elbow fracture 04/2017   Right, had surgery   Esophageal reflux    Fracture of inferior pubic ramus ([place]) 02/28/2019   Hematochezia    Hip fracture ([place]) 12/12/2018   History of echocardiogram    a. Echo 10/15 - EF 60-65%, no RWMA   HLD (hyperlipidemia)    Hx of colonoscopy    Macular pucker, right eye    will have removed on 01/26/21   Osteoporosis    Other chronic pulmonary heart diseases    Paroxysmal supraventricular tachycardia ([place])    Pneumonia    Pneumonia 09/18/2019   Pre-op evaluation 06/11/2019   Pyelonephritis 07/29/2019   Sacral insufficiency fracture 12/12/2018   Sarcoidosis    Sepsis ([place]) 07/30/2019  Sepsis due to Escherichia coli (e. 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   COLONOSCOPY     CYSTOSCOPY W/ RETROGRADES Left 07/30/2019   Procedure: CYSTOSCOPY WITH RETROGRADE PYELOGRAM LEFT STENT PLACEMENT;  Surgeon: Ardis Hughs, MD;  Location: [facility];  Service: Urology;  Laterality: Left;   CYSTOSCOPY WITH RETROGRADE PYELOGRAM, URETEROSCOPY AND  STENT PLACEMENT Left 08/20/2019   Procedure: CYSTOSCOPY, URETEROSCOPY AND STENT PLACEMENT;  Surgeon: Robley Fries, MD;  Location: [facility];  Service: Urology;  Laterality: Left;  1 HR   FOOT SURGERY Right    HIP SURGERY Right 2011   full replacement   HOLMIUM LASER APPLICATION Left 23/55/7322   Procedure: HOLMIUM LASER APPLICATION;  Surgeon: Robley Fries, MD;  Location: [facility];  Service: Urology;  Laterality: Left;   LEG SURGERY Left 06/2012   femur fracture s/p open and closed reduction in [place], Dr. Jimmye Norman   lens replacement Right    right eye   ORIF ELBOW FRACTURE Right 05/09/2017   Procedure: ORIF right olecranon fracture with repair/reconstruction, ulnar nerve transposition as needed;  Surgeon: Roseanne Kaufman, MD;  Location: [facility];  Service: Orthopedics;  Laterality: Right;  Requests 90 mins   pelvis fracture     POLYPECTOMY     REVERSE SHOULDER ARTHROPLASTY Left 06/13/2019   Procedure: REVERSE SHOULDER ARTHROPLASTY;  Surgeon: Justice Britain, MD;  Location: [facility];  Service: Orthopedics;  Laterality: Left;  188mn   SHOULDER ARTHROSCOPY W/ ROTATOR CUFF REPAIR Right    TRAPEZIUM RESECTION Right      A IV Location/Drains/Wounds Patient Lines/Drains/Airways Status     Active Line/Drains/Airways     Name Placement date Placement time Site Days   Peripheral IV 08/07/21 20 G Anterior;Left Forearm 08/07/21  --  Forearm  less than 1   Ureteral Drain/Stent Left ureter 6 Fr. 08/20/19  0947  Left ureter  718   External Urinary Catheter 09/18/19  2115  --  689   Incision (Closed) 06/13/19 Shoulder Left 06/13/19  1057  -- 786   Incision (Closed) 07/30/19 Other (Comment) Other (Comment) 07/30/19  1529  -- 739            Intake/Output Last 24 hours No intake or output data in the 24 hours ending 08/07/21 1921  Labs/Imaging Results for orders placed or performed during the hospital encounter of 08/07/21 (from the past 48 hour(s))  Basic metabolic panel     Status:  Abnormal   Collection Time: 08/07/21  3:35 PM  Result Value Ref Range   Sodium 138 135 - 145 mmol/L   Potassium 3.7 3.5 - 5.1 mmol/L   Chloride 108 98 - 111 mmol/L   CO2 19 (L) 22 - 32 mmol/L   Glucose, Bld 103 (H) 70 - 99 mg/dL    Comment: Glucose reference range applies only to samples taken after fasting for at least 8 hours.   BUN 18 8 - 23 mg/dL   Creatinine, Ser 0.83 0.44 - 1.00 mg/dL   Calcium 8.1 (L) 8.9 - 10.3 mg/dL   GFR,  Estimated >60 >60 mL/min    Comment: (NOTE) Calculated using the CKD-EPI Creatinine Equation (2021)    Anion gap 11 5 - 15    Comment: Performed at [facility], [place] [address] [place], [place] [address]  Troponin I (High Sensitivity)     Status: Abnormal   Collection Time: 08/07/21  3:35 PM  Result Value Ref Range   Troponin I (High Sensitivity) 23 (H) <18 ng/L    Comment: (NOTE) Elevated high sensitivity troponin I (hsTnI) values and significant  changes across serial measurements may suggest ACS but many other  chronic and acute conditions are known to elevate hsTnI results.  Refer to the "Links" section for chest pain algorithms and additional  guidance. Performed at [facility], [place] [address] [place], [place] [address]   CBC     Status: Abnormal   Collection Time: 08/07/21  3:35 PM  Result Value Ref Range   WBC 11.1 (H) 4.0 - 10.5 K/uL   RBC 3.81 (L) 3.87 - 5.11 MIL/uL   Hemoglobin 11.4 (L) 12.0 - 15.0 g/dL   HCT 36.1 36.0 - 46.0 %   MCV 94.8 80.0 - 100.0 fL   MCH 29.9 26.0 - 34.0 pg   MCHC 31.6 30.0 - 36.0 g/dL   RDW 14.2 11.5 - 15.5 %   Platelets 243 150 - 400 K/uL   nRBC 0.0 0.0 - 0.2 %    Comment: Performed at [facility], [place] [address], [place],  [address]   DG Chest Portable 1 View  Result Date: 08/07/2021 CLINICAL DATA:  Chest pain and shortness of breath since yesterday. EXAM: PORTABLE CHEST 1 VIEW COMPARISON:  06/16/2020 FINDINGS: Patient is slightly rotated to the left. Lungs are adequately  inflated without focal airspace consolidation. Mild stable blunting of the right costophrenic angle likely due in part to the degree of rotation. Cardiomediastinal silhouette and remainder of the exam is unchanged. IMPRESSION: No acute cardiopulmonary disease. Electronically Signed   By: Marin Olp M.D.   On: 08/07/2021 16:28    Pending Labs Unresulted Labs (From admission, onward)     Start     Ordered   08/08/21 0500  CBC  Tomorrow morning,   R        08/07/21 1827   08/08/21 9892  Basic metabolic panel  Tomorrow morning,   R        08/07/21 1827   08/07/21 1832  Brain natriuretic peptide  Add-on,   AD        08/07/21 1831            Vitals/Pain Today's Vitals   08/07/21 1715 08/07/21 1730 08/07/21 1745 08/07/21 1800  BP: (!) 188/70  (!) 184/65 (!) 172/68  Pulse: (!) 54 (!) 58 (!) 54 (!) 58  Resp: '15 17 18 16  '$ Temp:      TempSrc:      SpO2: 99% 99% 99% 99%  Weight:      Height:      PainSc:        Isolation Precautions No active isolations  Medications Medications  diltiazem (CARDIZEM CD) 24 hr capsule 180 mg (has no administration in time range)  nitroGLYCERIN (NITROSTAT) SL tablet 0.4 mg (0.4 mg Sublingual Given 08/07/21 1906)  rosuvastatin (CRESTOR) tablet 10 mg (has no administration in time range)  donepezil (ARICEPT) tablet 5 mg (has no administration in time range)  pantoprazole (PROTONIX) EC tablet 40 mg (has no administration in time range)  mometasone-formoterol (DULERA) 200-5 MCG/ACT inhaler 2 puff (has no administration in time range)  apixaban (ELIQUIS) tablet 2.5 mg (has no administration in time range)  oxybutynin (DITROPAN) tablet 5 mg (has no administration in time range)  Vibegron TABS 75 mg (has no administration in time range)  acetaminophen (TYLENOL) tablet 650 mg (has no administration in time range)  ondansetron (ZOFRAN) injection 4 mg (has no administration in time range)  sertraline (ZOLOFT) tablet 25 mg (has no administration in time  range)  traMADol (ULTRAM) tablet 50 mg (has no administration in time range)  albuterol (PROVENTIL) (2.5 MG/3ML) 0.083% nebulizer solution 2.5 mg (has no administration in time range)    Mobility walks Low fall risk   Focused Assessments Cardiac Assessment Handoff:  Cardiac Rhythm: Sinus bradycardia Lab Results  Component Value Date   CKTOTAL 53 01/08/2016   TROPONINI <0.03 04/29/2017   Lab Results  Component Value Date   DDIMER 1.10 (H) 04/15/2016   Does the Patient currently have chest pain? No    R Recommendations: See Admitting Provider Note  Report given to:   Additional Notes:

## 2021-08-08 ENCOUNTER — Inpatient Hospital Stay (HOSPITAL_COMMUNITY): Payer: Medicare Other

## 2021-08-08 DIAGNOSIS — R58 Hemorrhage, not elsewhere classified: Secondary | ICD-10-CM

## 2021-08-08 DIAGNOSIS — I2510 Atherosclerotic heart disease of native coronary artery without angina pectoris: Secondary | ICD-10-CM | POA: Diagnosis not present

## 2021-08-08 DIAGNOSIS — D649 Anemia, unspecified: Secondary | ICD-10-CM | POA: Diagnosis present

## 2021-08-08 DIAGNOSIS — Z885 Allergy status to narcotic agent status: Secondary | ICD-10-CM | POA: Diagnosis not present

## 2021-08-08 DIAGNOSIS — R778 Other specified abnormalities of plasma proteins: Secondary | ICD-10-CM | POA: Diagnosis present

## 2021-08-08 DIAGNOSIS — I480 Paroxysmal atrial fibrillation: Secondary | ICD-10-CM | POA: Diagnosis present

## 2021-08-08 DIAGNOSIS — E785 Hyperlipidemia, unspecified: Secondary | ICD-10-CM | POA: Diagnosis present

## 2021-08-08 DIAGNOSIS — K219 Gastro-esophageal reflux disease without esophagitis: Secondary | ICD-10-CM | POA: Diagnosis present

## 2021-08-08 DIAGNOSIS — N3289 Other specified disorders of bladder: Secondary | ICD-10-CM | POA: Diagnosis present

## 2021-08-08 DIAGNOSIS — R079 Chest pain, unspecified: Secondary | ICD-10-CM | POA: Diagnosis not present

## 2021-08-08 DIAGNOSIS — S60212A Contusion of left wrist, initial encounter: Secondary | ICD-10-CM | POA: Diagnosis present

## 2021-08-08 DIAGNOSIS — Z7951 Long term (current) use of inhaled steroids: Secondary | ICD-10-CM | POA: Diagnosis not present

## 2021-08-08 DIAGNOSIS — X58XXXA Exposure to other specified factors, initial encounter: Secondary | ICD-10-CM | POA: Diagnosis present

## 2021-08-08 DIAGNOSIS — Z7901 Long term (current) use of anticoagulants: Secondary | ICD-10-CM | POA: Diagnosis not present

## 2021-08-08 DIAGNOSIS — D860 Sarcoidosis of lung: Secondary | ICD-10-CM | POA: Diagnosis present

## 2021-08-08 DIAGNOSIS — I25110 Atherosclerotic heart disease of native coronary artery with unstable angina pectoris: Secondary | ICD-10-CM | POA: Diagnosis present

## 2021-08-08 DIAGNOSIS — I5032 Chronic diastolic (congestive) heart failure: Secondary | ICD-10-CM | POA: Diagnosis present

## 2021-08-08 DIAGNOSIS — J479 Bronchiectasis, uncomplicated: Secondary | ICD-10-CM | POA: Diagnosis present

## 2021-08-08 DIAGNOSIS — M810 Age-related osteoporosis without current pathological fracture: Secondary | ICD-10-CM | POA: Diagnosis present

## 2021-08-08 DIAGNOSIS — I471 Supraventricular tachycardia: Secondary | ICD-10-CM | POA: Diagnosis present

## 2021-08-08 DIAGNOSIS — Z881 Allergy status to other antibiotic agents status: Secondary | ICD-10-CM | POA: Diagnosis not present

## 2021-08-08 DIAGNOSIS — I25118 Atherosclerotic heart disease of native coronary artery with other forms of angina pectoris: Secondary | ICD-10-CM | POA: Diagnosis not present

## 2021-08-08 DIAGNOSIS — M545 Low back pain, unspecified: Secondary | ICD-10-CM | POA: Diagnosis present

## 2021-08-08 DIAGNOSIS — I110 Hypertensive heart disease with heart failure: Secondary | ICD-10-CM | POA: Diagnosis present

## 2021-08-08 DIAGNOSIS — Z882 Allergy status to sulfonamides status: Secondary | ICD-10-CM | POA: Diagnosis not present

## 2021-08-08 DIAGNOSIS — Z8249 Family history of ischemic heart disease and other diseases of the circulatory system: Secondary | ICD-10-CM | POA: Diagnosis not present

## 2021-08-08 DIAGNOSIS — Z79899 Other long term (current) drug therapy: Secondary | ICD-10-CM | POA: Diagnosis not present

## 2021-08-08 LAB — BASIC METABOLIC PANEL
Anion gap: 9 (ref 5–15)
BUN: 22 mg/dL (ref 8–23)
CO2: 22 mmol/L (ref 22–32)
Calcium: 8.6 mg/dL — ABNORMAL LOW (ref 8.9–10.3)
Chloride: 106 mmol/L (ref 98–111)
Creatinine, Ser: 0.93 mg/dL (ref 0.44–1.00)
GFR, Estimated: >60 mL/min (ref >60)
Glucose, Bld: 125 mg/dL — ABNORMAL HIGH (ref 70–99)
Potassium: 3.9 mmol/L (ref 3.5–5.1)
Sodium: 137 mmol/L (ref 135–145)

## 2021-08-08 LAB — LIPID PANEL
Cholesterol: 188 mg/dL (ref 0–200)
HDL: 67 mg/dL (ref >40)
LDL Cholesterol: 105 mg/dL — ABNORMAL HIGH (ref 0–99)
Total CHOL/HDL Ratio: 2.8 RATIO
Triglycerides: 81 mg/dL (ref <150)
VLDL: 16 mg/dL (ref 0–40)

## 2021-08-08 LAB — APTT
aPTT: 24 seconds (ref 24–36)
aPTT: 63 seconds — ABNORMAL HIGH (ref 24–36)
aPTT: 89 seconds — ABNORMAL HIGH (ref 24–36)

## 2021-08-08 LAB — HEPARIN LEVEL (UNFRACTIONATED)
Heparin Unfractionated: 0.16 IU/mL — ABNORMAL LOW (ref 0.30–0.70)
Heparin Unfractionated: 0.63 IU/mL (ref 0.30–0.70)
Heparin Unfractionated: 0.76 IU/mL — ABNORMAL HIGH (ref 0.30–0.70)

## 2021-08-08 LAB — CBC
HCT: 35.5 % — ABNORMAL LOW (ref 36.0–46.0)
Hemoglobin: 11.6 g/dL — ABNORMAL LOW (ref 12.0–15.0)
MCH: 29.8 pg (ref 26.0–34.0)
MCHC: 32.7 g/dL (ref 30.0–36.0)
MCV: 91.3 fL (ref 80.0–100.0)
Platelets: 259 10*3/uL (ref 150–400)
RBC: 3.89 MIL/uL (ref 3.87–5.11)
RDW: 14.1 % (ref 11.5–15.5)
WBC: 10.6 10*3/uL — ABNORMAL HIGH (ref 4.0–10.5)
nRBC: 0 % (ref 0.0–0.2)

## 2021-08-08 LAB — GLUCOSE, CAPILLARY: Glucose-Capillary: 79 mg/dL (ref 70–99)

## 2021-08-08 LAB — ECHOCARDIOGRAM COMPLETE
Area-P 1/2: 3.6 cm2
Height: 63 in
S' Lateral: 2.6 cm
Weight: 1920 oz

## 2021-08-08 LAB — HEMOGLOBIN A1C
Hgb A1c MFr Bld: 5.7 % — ABNORMAL HIGH (ref 4.8–5.6)
Mean Plasma Glucose: 116.89 mg/dL

## 2021-08-08 MED ORDER — ASPIRIN 81 MG PO CHEW
81.0000 mg | CHEWABLE_TABLET | ORAL | Status: AC
  Administered 2021-08-09: 81 mg via ORAL
  Filled 2021-08-08: qty 1

## 2021-08-08 MED ORDER — SODIUM CHLORIDE 0.9 % WEIGHT BASED INFUSION: 1.0000 mL/kg/h | INTRAVENOUS | Status: DC

## 2021-08-08 MED ORDER — SODIUM CHLORIDE 0.9 % IV SOLN: 250.0000 mL | INTRAVENOUS | Status: DC | PRN

## 2021-08-08 MED ORDER — SODIUM CHLORIDE 0.9 % WEIGHT BASED INFUSION
3.0000 mL/kg/h | INTRAVENOUS | Status: DC
  Administered 2021-08-09: 3 mL/kg/h via INTRAVENOUS

## 2021-08-08 MED ORDER — SODIUM CHLORIDE 0.9% FLUSH: 3.0000 mL | INTRAVENOUS | Status: DC | PRN

## 2021-08-08 MED ORDER — SODIUM CHLORIDE 0.9% FLUSH
3.0000 mL | Freq: Two times a day (BID) | INTRAVENOUS | Status: DC
  Administered 2021-08-08 – 2021-08-10 (×3): 3 mL via INTRAVENOUS

## 2021-08-08 NOTE — Assessment & Plan Note (Signed)
tramadol 

## 2021-08-08 NOTE — Assessment & Plan Note (Signed)
donepezil

## 2021-08-08 NOTE — Progress Notes (Addendum)
Progress Note  Patient Name: [name] Date of Encounter: 08/08/2021  [facility] Cardiologist: Sherren Mocha, MD   Subjective   Denies any significant chest pain overnight, however has been having chest heaviness with exertion that has been going on for months. Last episode was yesterday morning after she woke up to fix coffee in the kitchen.   Inpatient Medications    Scheduled Meds:  diltiazem  180 mg Oral Daily   donepezil  5 mg Oral QHS   metoprolol tartrate  12.5 mg Oral BID   mometasone-formoterol  2 puff Inhalation BID   oxybutynin  5 mg Oral QHS   pantoprazole  40 mg Oral Daily   [START ON 08/09/2021] rosuvastatin  10 mg Oral Once per day on Mon Wed Fri   sertraline  25 mg Oral QHS   Vibegron  75 mg Oral Daily   Continuous Infusions:  heparin 650 Units/hr (08/08/21 0242)   PRN Meds: acetaminophen, albuterol, nitroGLYCERIN, ondansetron (ZOFRAN) IV, traMADol   Vital Signs    Vitals:   08/07/21 2000 08/07/21 2015 08/07/21 2030 08/07/21 2112  BP: (!) 145/58 (!) 147/64 (!) 137/57 (!) 141/60  Pulse: (!) 59 (!) 59 (!) 58 70  Resp: '17 15 13   '$ Temp:    97.6 F (36.4 C)  TempSrc:    Oral  SpO2: 99% 99% 98% 98%  Weight:      Height:       No intake or output data in the 24 hours ending 08/08/21 0828    08/07/2021    3:31 PM 03/17/2021    2:43 PM 02/24/2021    9:57 AM  Last 3 Weights  Weight (lbs) 120 lb 121 lb 122 lb 9.6 oz  Weight (kg) 54.432 kg 54.885 kg 55.611 kg      Telemetry    NSR without significant ventricular ectopy - Personally Reviewed  ECG    NSR without significant ST-T wave changes - Personally Reviewed  Physical Exam   GEN: No acute distress.   Neck: No JVD Cardiac: RRR, no murmurs, rubs, or gallops.  Respiratory: Clear to auscultation bilaterally. GI: Soft, nontender, non-distended  MS: No edema; No deformity. Neuro:  Nonfocal  Psych: Normal affect   Labs    High Sensitivity Troponin:   Recent Labs  Lab  08/07/21 1535 08/07/21 1916  TROPONINIHS 23* 33*     Chemistry Recent Labs  Lab 08/07/21 1535 08/08/21 0124  NA 138 137  K 3.7 3.9  CL 108 106  CO2 19* 22  GLUCOSE 103* 125*  BUN 18 22  CREATININE 0.83 0.93  CALCIUM 8.1* 8.6*  GFRNONAA >60 >60  ANIONGAP 11 9    Lipids  Recent Labs  Lab 08/08/21 0124  CHOL 188  TRIG 81  HDL 67  LDLCALC 105*  CHOLHDL 2.8    Hematology Recent Labs  Lab 08/07/21 1535 08/08/21 0124  WBC 11.1* 10.6*  RBC 3.81* 3.89  HGB 11.4* 11.6*  HCT 36.1 35.5*  MCV 94.8 91.3  MCH 29.9 29.8  MCHC 31.6 32.7  RDW 14.2 14.1  PLT 243 259   Thyroid No results for input(s): "TSH", "FREET4" in the last 168 hours.  BNP Recent Labs  Lab 08/07/21 1900  BNP 387.3*    DDimer No results for input(s): "DDIMER" in the last 168 hours.   Radiology    DG Chest Portable 1 View  Result Date: 08/07/2021 CLINICAL DATA:  Chest pain and shortness of breath since yesterday. EXAM: PORTABLE  CHEST 1 VIEW COMPARISON:  06/16/2020 FINDINGS: Patient is slightly rotated to the left. Lungs are adequately inflated without focal airspace consolidation. Mild stable blunting of the right costophrenic angle likely due in part to the degree of rotation. Cardiomediastinal silhouette and remainder of the exam is unchanged. IMPRESSION: No acute cardiopulmonary disease. Electronically Signed   By: Marin Olp M.D.   On: 08/07/2021 16:28    Cardiac Studies   Cardiac cath 05/20/2015 The left ventricular systolic function is normal. Mid RCA lesion, 30% stenosed. Mid Cx lesion, 40% stenosed. Ost Cx lesion, 70% stenosed.   1. Moderate single-vessel coronary artery disease involving the ostium of the left circumflex. Interrogated with FFR which is negative.   2. Widely patent LAD and RCA.   3. Normal LV function.   Recommendations: Medical therapy for nonobstructive CAD.    Echo 09/23/2019  1. Left ventricular ejection fraction, by estimation, is 55 to 60%. The  left  ventricle has normal function. The left ventricle has no regional  wall motion abnormalities. Left ventricular diastolic parameters are  consistent with Grade I diastolic  dysfunction (impaired relaxation).   2. Right ventricular systolic function is normal. The right ventricular  size is normal. There is normal pulmonary artery systolic pressure. The  estimated right ventricular systolic pressure is 96.2 mmHg.   3. The mitral valve is degenerative. Trivial mitral valve regurgitation.  No evidence of mitral stenosis.   4. The aortic valve is grossly normal. Aortic valve regurgitation is not  visualized. No aortic stenosis is present.   5. The inferior vena cava is normal in size with greater than 50%  respiratory variability, suggesting right atrial pressure of 3 mmHg.    Patient Profile     87 y.o. female with PMH of CAD (no prior intervention), carotid artery disease, HTN, HLD, PAF/pSVT and pulmonary sarcoidosis who presented with chest pain and was ruled in for STEMI.   Assessment & Plan    NSTEMI  - presentation is concerning for progressive angina. No "chest pain", describe the symptom as "chest heaviness"  and worsening dyspnea on exertion that happens more so with physical activity.   - BNP 387, serial trop 23-->33. Not appears to be volume overloaded.   - given concerning symptom, agree with fellow's recommendation for cardiac catheterization tomorrow. Will discuss with MD  -Risk and benefit of procedure explained to the patient who display clear understanding and agree to proceed. Discussed with patient possible procedural risk include bleeding, vascular injury, renal injury, arrythmia, MI, stroke and loss of limb or life.  CAD: Cath 04/2015 ost LCx 60-70% lesion with negative FFR, 40% mid LCx, 30% mid RCA  HTN: low dose metoprolol added on top of home cardizem  HLD  PAF/pSVT: on Eliquis and Cardizem CD '180mg'$  daily. Cardizem resumed and Eliquis held. Last dose of Eliquis was  the morning of 6/17  Pulmonary sarcoidosis      For questions or updates, please contact [place] Please consult [email or web address] for contact info under        Signed, Almyra Deforest, [place]  08/08/2021, 8:28 AM    Personally seen and examined. Agree with APP above with the following comments:  Briefly 87 yo F with known CAD.  In 2020 anginal equivalent was SOB.  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   Patient notes exertional SOB. No CP or Chest pressure  Exam notable for inspiratory wheezes.  Bruising of  L radial.  +2 R radial next to her only IV  Tele: SR to sinus bradycardia EKG: sinus bradycardia septal infarct pattern  Would recommend  - will trial very low dose BB - agree with Echo - will plan for LHC tomorrow - on meds for dementia but AOX4  Rudean Haskell, MD Cardiologist [facility] [place]  [address], [address] [place], [place] [address] [phone]  9:37 AM

## 2021-08-08 NOTE — Assessment & Plan Note (Signed)
No exacerbation ?

## 2021-08-08 NOTE — Progress Notes (Signed)
ANTICOAGULATION CONSULT NOTE - Follow Up Consult  Pharmacy Consult for IV heparin Indication: NSTEMI  Allergies  Allergen Reactions   Levaquin [Levofloxacin] Other (See Comments)    Joint pain.. Per doctor not to take again   Sulfonamide Derivatives Other (See Comments)    Possibly caused hepatitis   Oxycodone Nausea Only    Can take oxycodone with Zofran   Sulfa Antibiotics Other (See Comments)    Possibly caused hepatitis   Morphine Nausea Only    Patient Measurements: Height: '5\' 3"'$  (160 cm) Weight: 54.4 kg (120 lb) IBW/kg (Calculated) : 52.4 Heparin Dosing Weight: 54.4 kg   Vital Signs:    Labs: Recent Labs    08/07/21 1535 08/07/21 1916 08/07/21 2150 08/08/21 0124 08/08/21 1104  HGB 11.4*  --   --  11.6*  --   HCT 36.1  --   --  35.5*  --   PLT 243  --   --  259  --   APTT  --   --  22* 24 63*  HEPARINUNFRC  --   --  0.23* 0.16* 0.63  CREATININE 0.83  --   --  0.93  --   TROPONINIHS 23* 33*  --   --   --     Estimated Creatinine Clearance: 36.6 mL/min (by C-G formula based on SCr of 0.93 mg/dL).   Assessment: 87 y.o. female with PMH SVT, CAD, HTN, HLD,  CAD, COPD, atrial fibrillation, anemia, and CHF. On apixaban 2.5 mg BID PTA for afib. Last dose 6/17 AM. Planning for cath and TTE.   Of note, patient had recent procedure (lumbar ESI injection), that apixaban was to be held for 3 days prior without bridging. Date of procedure was 6/13.   aPTT subtherapeutic at 63 on 650 units/hr. Heparin level falsely elevated due to recent DOAC use. CBC stable with hgb at 11.6 and PLT at 259. No s/sx of bleeding noted. Possible cardiac catheterization tomorrow.   Goal of Therapy:  Heparin level 0.3-0.7 units/ml aPTT 66-102 seconds Monitor platelets by anticoagulation protocol: Yes   Plan:  Increase heparin drip to 750 units/hr F/up 8h aPTT/heparin level Use aPTT to dose heparin until aPTT and heparin levels correlate Monitor CBC and s/sx of bleeding daily    Joseph Art, Pharm.D. PGY-1 Pharmacy Resident [phone] 08/08/2021 12:25 PM

## 2021-08-08 NOTE — Assessment & Plan Note (Addendum)
Cath 2017 with nonobstructive CAD Appreciate cardiology recs Planning [facility] 6/19 (as above)

## 2021-08-08 NOTE — Assessment & Plan Note (Signed)
trend

## 2021-08-08 NOTE — Assessment & Plan Note (Addendum)
eliquis to resume tomorrow Diltiazem Metop started  Episode of SVT noted per EMS per daughter's report.  Not sure if this related to dyspnea leading to presentation.  Consider outpatient zio?  Discussed with Dr. Burt Knack.

## 2021-08-08 NOTE — Assessment & Plan Note (Signed)
Mild, follow °

## 2021-08-08 NOTE — Progress Notes (Signed)
ANTICOAGULATION CONSULT NOTE - Follow Up Consult  Pharmacy Consult for IV heparin Indication: NSTEMI  Allergies  Allergen Reactions   Levaquin [Levofloxacin] Other (See Comments)    Joint pain.. Per doctor not to take again   Sulfonamide Derivatives Other (See Comments)    Possibly caused hepatitis   Oxycodone Nausea Only    Can take oxycodone with Zofran   Sulfa Antibiotics Other (See Comments)    Possibly caused hepatitis   Morphine Nausea Only    Patient Measurements: Height: '5\' 3"'$  (160 cm) Weight: 54.4 kg (120 lb) IBW/kg (Calculated) : 52.4 Heparin Dosing Weight: 54.4 kg   Vital Signs: Temp: 97.5 F (36.4 C) (06/18 1655) Temp Source: Oral (06/18 1655) BP: 122/76 (06/18 1655) Pulse Rate: 51 (06/18 1655)  Labs: Recent Labs    08/07/21 1535 08/07/21 1916 08/07/21 2150 08/08/21 0124 08/08/21 1104 08/08/21 2008  HGB 11.4*  --   --  11.6*  --   --   HCT 36.1  --   --  35.5*  --   --   PLT 243  --   --  259  --   --   APTT  --   --    < > 24 63* 89*  HEPARINUNFRC  --   --    < > 0.16* 0.63 0.76*  CREATININE 0.83  --   --  0.93  --   --   TROPONINIHS 23* 33*  --   --   --   --    < > = values in this interval not displayed.     Estimated Creatinine Clearance: 36.6 mL/min (by C-G formula based on SCr of 0.93 mg/dL).   Assessment: 87 y.o. female with PMH SVT, CAD, HTN, HLD,  CAD, COPD, atrial fibrillation, anemia, and CHF. On apixaban 2.5 mg BID PTA for afib. Last dose 6/17 AM. Planning for cath and TTE.   Of note, patient had recent procedure (lumbar ESI injection), that apixaban was to be held for 3 days prior without bridging. Date of procedure was 6/13.   Heparin level falsely elevated due to recent DOAC use. This morning CBC stable with hgb at 11.6 and PLT at 259. No s/sx of bleeding noted. Possible cardiac catheterization tomorrow.   Evening update: aPTT therapeutic at 89 on 750 units/hr.No infusion issues or bleeding noted.  Goal of Therapy:  Heparin  level 0.3-0.7 units/ml aPTT 66-102 seconds Monitor platelets by anticoagulation protocol: Yes   Plan:  Continue heparin at 750 units/hr Check aPTT in 8 hours and daily until correlating with heparin levels Check anti-Xa level daily while on heparin Continue to monitor H&H and platelets   Thank you for allowing [place] to participate in this patients care. Jens Som, PharmD 08/08/2021 8:46 PM  **Pharmacist phone directory can be found on [email or web address] listed under [place]**

## 2021-08-08 NOTE — Assessment & Plan Note (Signed)
PPI ?

## 2021-08-08 NOTE — Progress Notes (Signed)
  Echocardiogram 2D Echocardiogram has been performed.  Johny Chess 08/08/2021, 4:54 PM

## 2021-08-08 NOTE — Progress Notes (Signed)
TRH night cross cover note:   I was notified by RN that the patient was complaining of discomfort at the site of her 20-gauge peripheral IV in the left forearm, prompting RN to remove this peripheral IV at which time a hematoma at the level of the left wrist, reportedly slightly larger than golf ball size, developed with transient bleeding through the skin at the site where the peripheral IV was removed.  Hemostasis as relates to be bleeding through the skin was quickly achieved via pressure and elevation of the left upper extremity.  With these measures left wrist hematoma has significantly flattened.  No report of associated sensory deficits.  Patient otherwise asymptomatic at this time, and vital signs appear stable, with systolic blood pressures in the 140s along with heart rates in the 60s.   Per my chart review, this is a 87 year old female who was admitted earlier this evening for further evaluation of presenting chest pain along with ACS rule out.  Her initial troponin was mildly elevated at 23, with repeat value trending up slightly to 33, prompting order for initiation of heparin drip.  However, it is noted that the patient developed the left wrist hematoma prior to actual initiation of the heparin drip, which has not yet been started. I requested that heparin be held and that we would reassess in an hour.   I ordered stat H&H and subsequently contacted patient's RN again, approximately one hour after my initial discussions with her regarding this hematoma.  At the time of this 1 hour follow-up, RN conveys that left wrist hematoma continues to be improved relative to the initial size, and without any interval increase in size over the last hour. No additional bleeding through the skin, and patient remains otherwise asymptomatic, with stable vital signs similar to the above.   At that time, I requested that peripheral access be reestablished, this time on the right upper extremity, and that heparin  drip be initiated, as previously ordered.  will follow for result of scheduled CBC that has been ordered with a.m. labs, while closely monitoring ensuing vital signs.    Babs Bertin, DO Hospitalist

## 2021-08-08 NOTE — Assessment & Plan Note (Signed)
Echo 09/2019 with EF 68-86%, grade 1 diastolic dysfunction

## 2021-08-08 NOTE — Progress Notes (Signed)
PROGRESS NOTE    [name]  ATF:[id number] DOB: 23-Feb-1934 DOA: 08/07/2021 PCP: [name], MD  Chief Complaint  Patient presents with   Chest Pain    Brief Narrative:   [name] is [name] 87 y.o. female with medical history significant of paroxysmal SVT, CAD, hypertension, hyperlipidemia, carotid artery disease, COPD, sarcoid, bronchiectasis, GERD, low back pain, atrial fibrillation, anemia, CHF, cognitive decline, low compliance bladder presenting with chest pain.  Patient reports 2 days of chest pain.  Initially started feeling unwell yesterday has had increasing shortness of breath and chest heaviness.  Does have some shortness of breath intermittently at baseline but now much more significant and occurs even with prolonged speaking.  Chest pain is described as Reathel Turi heaviness that is intermittent without specific triggers that she has noticed.  Is associated with shortness of breath when it occurs.  States she tried nitro glycerin today and did have relief.  EMS was called and noted at least 30 seconds of tachycardia on the monitor.  Patient is unsure if this is similar to prior cardiac chest pain.   She denies fevers, chills, abdominal pain, constipation, diarrhea, nausea, vomiting.   ED Course: Vital signs in the ED significant for heart rate in the 50s to 60s, respiratory rate in the teens to 20s, blood pressure in the 270W to 237 systolic.  Lab work-up included BMP with bicarb 19, glucose 23, calcium 8.1.  CBC with mild leukocytosis to 11.1 and hemoglobin 11.4 from baseline of 12-13.  Troponin initially mildly elevated at 23 with repeat pending.  Chest x-ray showed no acute abnormality.  Patient received Nitropaste in the ED.  Cardiology was consulted and will see the patient in requested medicine admission.  Recommended to hold off on heparin for now unless patient has significant rise in troponins overnight.      Assessment & Plan:   Principal Problem:   Chest pain,  rule out acute myocardial infarction Active Problems:   CAD (coronary artery disease)   Leukocytosis   Atrial fibrillation (HCC)   Chronic heart failure with preserved ejection fraction (HCC)   Paroxysmal supraventricular tachycardia (HCC)   Ecchymosis   COPD (chronic obstructive pulmonary disease) (HCC)   GERD (gastroesophageal reflux disease)   SARCOIDOSIS, PULMONARY   Low back pain   Anemia   Cognitive decline   Bladder spasm   HLD (hyperlipidemia)   Essential hypertension   Carotid artery disease (HCC)   Bronchiectasis without complication (HCC)   Low compliance bladder   Assessment and Plan: * Chest pain, rule out acute myocardial infarction Troponin mildly elevated Echo pending EKG without acute findings Cards recommending LHC  Currently on heparin while holding home eliquis Low dose beta blocker per cards   CAD (coronary artery disease) Cath 2017 with nonobstructive CAD Appreciate cardiology recs [facility] 6/19  Leukocytosis Mild, follow  Chronic heart failure with preserved ejection fraction ([place]) Echo 09/2019 with EF 62-83%, grade 1 diastolic dysfunction  Atrial fibrillation (HCC) Currently on heparin gtt while eliquis on hold Diltiazem Metop started  Ecchymosis Hematoma noted to L wrist last night, now with bruising Will monitor   Paroxysmal supraventricular tachycardia ([place]) Noted hx Continued av nodal blockers  COPD (chronic obstructive pulmonary disease) (HCC) No exacerbation  GERD (gastroesophageal reflux disease) PPI  Low back pain tramadol  SARCOIDOSIS, PULMONARY Noted, follow outpatient  Anemia trend  Cognitive decline donepezil  Bladder spasm Oxybutynin vibegron          DVT prophylaxis: heparin Code Status:  full Family Communication: none Disposition:   Status is: Observation The patient will require care spanning > 2 midnights and should be moved to inpatient because: cards c/s   Consultants:   cards  Procedures:  none  Antimicrobials:  Anti-infectives (From admission, onward)    None       Subjective: No complaints Feels better now Describes chest heaviness, lasting about 30 min or so? Resolved now  Objective: Vitals:   08/07/21 2015 08/07/21 2030 08/07/21 2112 08/08/21 0851  BP: (!) 147/64 (!) 137/57 (!) 141/60   Pulse: (!) 59 (!) 58 70   Resp: 15 13    Temp:   97.6 F (36.4 C)   TempSrc:   Oral   SpO2: 99% 98% 98% 97%  Weight:      Height:       No intake or output data in the 24 hours ending 08/08/21 1536 Filed Weights   08/07/21 1531  Weight: 54.4 kg    Examination:  General exam: Appears calm and comfortable  Respiratory system: unlabored Cardiovascular system: RRR Gastrointestinal system: Abdomen is nondistended, soft and nontender.  Central nervous system: Alert and oriented. No focal neurological deficits. Extremities: no LEE   Data Reviewed: I have personally reviewed following labs and imaging studies  CBC: Recent Labs  Lab 08/07/21 1535 08/08/21 0124  WBC 11.1* 10.6*  HGB 11.4* 11.6*  HCT 36.1 35.5*  MCV 94.8 91.3  PLT 243 956    Basic Metabolic Panel: Recent Labs  Lab 08/07/21 1535 08/08/21 0124  NA 138 137  K 3.7 3.9  CL 108 106  CO2 19* 22  GLUCOSE 103* 125*  BUN 18 22  CREATININE 0.83 0.93  CALCIUM 8.1* 8.6*    GFR: Estimated Creatinine Clearance: 36.6 mL/min (by C-G formula based on SCr of 0.93 mg/dL).  Liver Function Tests: No results for input(s): "AST", "ALT", "ALKPHOS", "BILITOT", "PROT", "ALBUMIN" in the last 168 hours.  CBG: Recent Labs  Lab 08/08/21 0539  GLUCAP 79     No results found for this or any previous visit (from the past 240 hour(s)).       Radiology Studies: DG Chest Portable 1 View  Result Date: 08/07/2021 CLINICAL DATA:  Chest pain and shortness of breath since yesterday. EXAM: PORTABLE CHEST 1 VIEW COMPARISON:  06/16/2020 FINDINGS: Patient is slightly rotated to the  left. Lungs are adequately inflated without focal airspace consolidation. Mild stable blunting of the right costophrenic angle likely due in part to the degree of rotation. Cardiomediastinal silhouette and remainder of the exam is unchanged. IMPRESSION: No acute cardiopulmonary disease. Electronically Signed   By: Marin Olp M.D.   On: 08/07/2021 16:28        Scheduled Meds:  diltiazem  180 mg Oral Daily   donepezil  5 mg Oral QHS   metoprolol tartrate  12.5 mg Oral BID   mometasone-formoterol  2 puff Inhalation BID   oxybutynin  5 mg Oral QHS   pantoprazole  40 mg Oral Daily   [START ON 08/09/2021] rosuvastatin  10 mg Oral Once per day on Mon Wed Fri   sertraline  25 mg Oral QHS   sodium chloride flush  3 mL Intravenous Q12H   Vibegron  75 mg Oral Daily   Continuous Infusions:  heparin 750 Units/hr (08/08/21 1240)     LOS: 0 days    Time spent: over 30 min    Fayrene Helper, MD [facility]   To contact the attending provider between 7A-7P  or the covering provider during after hours 7P-7A, please log into the web site [email or web address] and access using universal [place] password for that web site. If you do not have the password, please call the hospital operator.  08/08/2021, 3:36 PM

## 2021-08-08 NOTE — Assessment & Plan Note (Addendum)
Troponin mildly elevated Echo pending EKG without acute findings Cards recommending LHC -> with mild nonobstructing plaquing in RCA, patent L main with no significant stenosis, patent LAD with mild proximal vessel stenosis, moderate ostial circumflex stenosis estimated at 60-70%, stable coronary anatomy from cardiac cath study in 2017 Resume eliquis Low dose beta blocker per cards

## 2021-08-08 NOTE — Assessment & Plan Note (Signed)
Oxybutynin vibegron

## 2021-08-08 NOTE — Assessment & Plan Note (Signed)
Noted, follow outpatient

## 2021-08-08 NOTE — Assessment & Plan Note (Signed)
Noted hx Continued av nodal blockers

## 2021-08-08 NOTE — Hospital Course (Signed)
[name] is [name] 87 y.o. female with medical history significant of paroxysmal SVT, CAD, hypertension, hyperlipidemia, carotid artery disease, COPD, sarcoid, bronchiectasis, GERD, low back pain, atrial fibrillation, anemia, CHF, cognitive decline, low compliance bladder presenting with chest pain.  Patient reports 2 days of chest pain.  Initially started feeling unwell yesterday has had increasing shortness of breath and chest heaviness.  Does have some shortness of breath intermittently at baseline but now much more significant and occurs even with prolonged speaking.  Chest pain is described as Nikiesha Milford heaviness that is intermittent without specific triggers that she has noticed.  Is associated with shortness of breath when it occurs.  States she tried nitro glycerin today and did have relief.  EMS was called and noted at least 30 seconds of tachycardia on the monitor.  Patient is unsure if this is similar to prior cardiac chest pain.  She was admitted for workup for chest pain rule out and shortness of breath.  Now s/p catheterization which showed stable findings.  Daughter expressed concern for her worsening DOE over past 6 months or so, CT scan showed small L effusion.  Patient maintaining sats.  Planning for outpatient pulm follow up at this time.  See below for additional details

## 2021-08-08 NOTE — Assessment & Plan Note (Signed)
Hematoma noted to L wrist last night, now with bruising Will monitor

## 2021-08-09 ENCOUNTER — Telehealth: Payer: Self-pay | Admitting: Emergency Medicine

## 2021-08-09 ENCOUNTER — Encounter (HOSPITAL_COMMUNITY)
Admission: EM | Disposition: A | Discharge status: Home / Self Care | Payer: Self-pay | Source: Home / Self Care | Attending: Family Medicine

## 2021-08-09 ENCOUNTER — Encounter (HOSPITAL_COMMUNITY): Payer: Self-pay | Admitting: Cardiovascular Disease

## 2021-08-09 DIAGNOSIS — I2510 Atherosclerotic heart disease of native coronary artery without angina pectoris: Secondary | ICD-10-CM | POA: Diagnosis not present

## 2021-08-09 DIAGNOSIS — R778 Other specified abnormalities of plasma proteins: Principal | ICD-10-CM

## 2021-08-09 DIAGNOSIS — R079 Chest pain, unspecified: Secondary | ICD-10-CM | POA: Diagnosis not present

## 2021-08-09 HISTORY — PX: LEFT HEART CATH AND CORONARY ANGIOGRAPHY: CATH118249

## 2021-08-09 LAB — COMPREHENSIVE METABOLIC PANEL
ALT: 13 U/L (ref 0–44)
AST: 13 U/L — ABNORMAL LOW (ref 15–41)
Albumin: 2.9 g/dL — ABNORMAL LOW (ref 3.5–5.0)
Alkaline Phosphatase: 62 U/L (ref 38–126)
Anion gap: 7 (ref 5–15)
BUN: 22 mg/dL (ref 8–23)
CO2: 23 mmol/L (ref 22–32)
Calcium: 8.5 mg/dL — ABNORMAL LOW (ref 8.9–10.3)
Chloride: 107 mmol/L (ref 98–111)
Creatinine, Ser: 1.05 mg/dL — ABNORMAL HIGH (ref 0.44–1.00)
GFR, Estimated: 52 mL/min — ABNORMAL LOW (ref >60)
Glucose, Bld: 91 mg/dL (ref 70–99)
Potassium: 3.7 mmol/L (ref 3.5–5.1)
Sodium: 137 mmol/L (ref 135–145)
Total Bilirubin: 0.2 mg/dL — ABNORMAL LOW (ref 0.3–1.2)
Total Protein: 5.2 g/dL — ABNORMAL LOW (ref 6.5–8.1)

## 2021-08-09 LAB — CBC WITH DIFFERENTIAL/PLATELET
Abs Immature Granulocytes: 0 10*3/uL (ref 0.00–0.07)
Basophils Absolute: 0 10*3/uL (ref 0.0–0.1)
Basophils Relative: 0 %
Eosinophils Absolute: 0.2 10*3/uL (ref 0.0–0.5)
Eosinophils Relative: 2 %
HCT: 36.3 % (ref 36.0–46.0)
Hemoglobin: 12 g/dL (ref 12.0–15.0)
Lymphocytes Relative: 27 %
Lymphs Abs: 3.1 10*3/uL (ref 0.7–4.0)
MCH: 30.1 pg (ref 26.0–34.0)
MCHC: 33.1 g/dL (ref 30.0–36.0)
MCV: 91 fL (ref 80.0–100.0)
Monocytes Absolute: 1.2 10*3/uL — ABNORMAL HIGH (ref 0.1–1.0)
Monocytes Relative: 10 %
Neutro Abs: 7.1 10*3/uL (ref 1.7–7.7)
Neutrophils Relative %: 61 %
Platelets: 251 10*3/uL (ref 150–400)
RBC: 3.99 MIL/uL (ref 3.87–5.11)
RDW: 14.3 % (ref 11.5–15.5)
WBC: 11.6 10*3/uL — ABNORMAL HIGH (ref 4.0–10.5)
nRBC: 0 % (ref 0.0–0.2)
nRBC: 0 /100 WBC

## 2021-08-09 LAB — PHOSPHORUS: Phosphorus: 3.7 mg/dL (ref 2.5–4.6)

## 2021-08-09 LAB — MAGNESIUM: Magnesium: 1.8 mg/dL (ref 1.7–2.4)

## 2021-08-09 LAB — HEPARIN LEVEL (UNFRACTIONATED): Heparin Unfractionated: 1.03 IU/mL — ABNORMAL HIGH (ref 0.30–0.70)

## 2021-08-09 LAB — APTT: aPTT: 115 seconds — ABNORMAL HIGH (ref 24–36)

## 2021-08-09 SURGERY — LEFT HEART CATH AND CORONARY ANGIOGRAPHY
Anesthesia: LOCAL

## 2021-08-09 MED ORDER — SODIUM CHLORIDE 0.9 % WEIGHT BASED INFUSION
1.0000 mL/kg/h | INTRAVENOUS | Status: AC
  Administered 2021-08-09: 1 mL/kg/h via INTRAVENOUS

## 2021-08-09 MED ORDER — SODIUM CHLORIDE 0.9 % IV SOLN
250.0000 mL | INTRAVENOUS | Status: DC | PRN
Start: 2021-08-09 — End: 2021-08-10

## 2021-08-09 MED ORDER — MIDAZOLAM HCL 2 MG/2ML IJ SOLN
INTRAMUSCULAR | Status: AC
  Filled 2021-08-09: qty 2

## 2021-08-09 MED ORDER — NITROGLYCERIN 1 MG/10 ML FOR IR/CATH LAB
INTRA_ARTERIAL | Status: DC | PRN
  Administered 2021-08-09: 150 ug via INTRA_ARTERIAL

## 2021-08-09 MED ORDER — VERAPAMIL HCL 2.5 MG/ML IV SOLN
INTRAVENOUS | Status: AC
  Filled 2021-08-09: qty 2

## 2021-08-09 MED ORDER — IOHEXOL 350 MG/ML SOLN
INTRAVENOUS | Status: DC | PRN
  Administered 2021-08-09: 45 mL

## 2021-08-09 MED ORDER — LIDOCAINE HCL (PF) 1 % IJ SOLN
INTRAMUSCULAR | Status: DC | PRN
  Administered 2021-08-09: 2 mL

## 2021-08-09 MED ORDER — HEPARIN (PORCINE) IN NACL 1000-0.9 UT/500ML-% IV SOLN
INTRAVENOUS | Status: AC
  Filled 2021-08-09: qty 1000

## 2021-08-09 MED ORDER — LIDOCAINE HCL (PF) 1 % IJ SOLN
INTRAMUSCULAR | Status: AC
  Filled 2021-08-09: qty 30

## 2021-08-09 MED ORDER — APIXABAN 2.5 MG PO TABS
2.5000 mg | ORAL_TABLET | Freq: Two times a day (BID) | ORAL | Status: DC
  Administered 2021-08-10: 2.5 mg via ORAL
  Filled 2021-08-09: qty 1

## 2021-08-09 MED ORDER — SODIUM CHLORIDE 0.9% FLUSH: 3.0000 mL | Freq: Two times a day (BID) | INTRAVENOUS | Status: DC

## 2021-08-09 MED ORDER — HEPARIN (PORCINE) IN NACL 1000-0.9 UT/500ML-% IV SOLN
INTRAVENOUS | Status: DC | PRN
  Administered 2021-08-09 (×2): 500 mL

## 2021-08-09 MED ORDER — MIDAZOLAM HCL 2 MG/2ML IJ SOLN
INTRAMUSCULAR | Status: DC | PRN
  Administered 2021-08-09 (×2): 1 mg via INTRAVENOUS

## 2021-08-09 MED ORDER — FENTANYL CITRATE (PF) 100 MCG/2ML IJ SOLN
INTRAMUSCULAR | Status: DC | PRN
Start: 2021-08-09 — End: 2021-08-09
  Administered 2021-08-09 (×2): 25 ug via INTRAVENOUS

## 2021-08-09 MED ORDER — VERAPAMIL HCL 2.5 MG/ML IV SOLN
INTRAVENOUS | Status: DC | PRN
  Administered 2021-08-09: 10 mL via INTRA_ARTERIAL

## 2021-08-09 MED ORDER — HYDRALAZINE HCL 20 MG/ML IJ SOLN: 10.0000 mg | INTRAMUSCULAR | Status: AC | PRN

## 2021-08-09 MED ORDER — SODIUM CHLORIDE 0.9% FLUSH: 3.0000 mL | INTRAVENOUS | Status: DC | PRN

## 2021-08-09 MED ORDER — NITROGLYCERIN 1 MG/10 ML FOR IR/CATH LAB
INTRA_ARTERIAL | Status: AC
  Filled 2021-08-09: qty 10

## 2021-08-09 MED ORDER — ASPIRIN 81 MG PO TBEC
81.0000 mg | DELAYED_RELEASE_TABLET | Freq: Every day | ORAL | Status: DC
  Administered 2021-08-10: 81 mg via ORAL
  Filled 2021-08-09: qty 1

## 2021-08-09 MED ORDER — FENTANYL CITRATE (PF) 100 MCG/2ML IJ SOLN
INTRAMUSCULAR | Status: AC
  Filled 2021-08-09: qty 2

## 2021-08-09 MED ORDER — HEPARIN SODIUM (PORCINE) 1000 UNIT/ML IJ SOLN
INTRAMUSCULAR | Status: DC | PRN
  Administered 2021-08-09: 3000 [IU] via INTRAVENOUS

## 2021-08-09 MED ORDER — LABETALOL HCL 5 MG/ML IV SOLN: 10.0000 mg | INTRAVENOUS | Status: AC | PRN

## 2021-08-09 SURGICAL SUPPLY — 13 items
BAND CMPR LRG ZPHR (HEMOSTASIS) ×1
BAND ZEPHYR COMPRESS 30 LONG (HEMOSTASIS) ×1 IMPLANT
CATH 5FR JL3.5 JR4 ANG PIG MP (CATHETERS) ×1 IMPLANT
CATH INFINITI 4FR 3 DRC (CATHETERS) ×1 IMPLANT
CATH INFINITI MULTIPACK ANG 4F (CATHETERS) ×1 IMPLANT
CATH LAUNCHER 5F EBU3.5 (CATHETERS) ×1 IMPLANT
GUIDEWIRE INQWIRE 1.5J.035X260 (WIRE) IMPLANT
INQWIRE 1.5J .035X260CM (WIRE) ×2
KIT HEART LEFT (KITS) ×3 IMPLANT
PACK CARDIAC CATHETERIZATION (CUSTOM PROCEDURE TRAY) ×3 IMPLANT
TRANSDUCER W/STOPCOCK (MISCELLANEOUS) ×3 IMPLANT
TUBING CIL FLEX 10 FLL-RA (TUBING) ×3 IMPLANT
WIRE HI TORQ VERSACORE-J 145CM (WIRE) ×1 IMPLANT

## 2021-08-09 NOTE — H&P (View-Only) (Signed)
Progress Note  Patient Name: [name] Date of Encounter: 08/09/2021  [facility] Cardiologist: Sherren Mocha, MD   Subjective   Chest pain and dyspnea resolved with addition of heparin.  No palpitation.  Inpatient Medications    Scheduled Meds:  [START ON 08/10/2021] aspirin EC  81 mg Oral Daily   diltiazem  180 mg Oral Daily   donepezil  5 mg Oral QHS   metoprolol tartrate  12.5 mg Oral BID   mometasone-formoterol  2 puff Inhalation BID   oxybutynin  5 mg Oral QHS   pantoprazole  40 mg Oral Daily   rosuvastatin  10 mg Oral Once per day on Mon Wed Fri   sertraline  25 mg Oral QHS   sodium chloride flush  3 mL Intravenous Q12H   Vibegron  75 mg Oral Daily   Continuous Infusions:  sodium chloride     sodium chloride 1 mL/kg/hr (08/09/21 0605)   heparin 750 Units/hr (08/08/21 1240)   PRN Meds: sodium chloride, acetaminophen, albuterol, nitroGLYCERIN, ondansetron (ZOFRAN) IV, sodium chloride flush, traMADol   Vital Signs    Vitals:   08/08/21 2045 08/08/21 2241 08/09/21 0551 08/09/21 0840  BP:   (!) 142/56   Pulse:  (!) 49 62 (!) 52  Resp:   18 16  Temp:   97.9 F (36.6 C)   TempSrc:   Oral   SpO2: 97%   95%  Weight:      Height:        Intake/Output Summary (Last 24 hours) at 08/09/2021 1013 Last data filed at 08/09/2021 0700 Gross per 24 hour  Intake 463.78 ml  Output --  Net 463.78 ml      08/08/2021    8:37 PM 08/07/2021    3:31 PM 03/17/2021    2:43 PM  Last 3 Weights  Weight (lbs) 119 lb 15.9 oz 120 lb 121 lb  Weight (kg) 54.43 kg 54.432 kg 54.885 kg      Telemetry    Normal sinus rhythm- Personally Reviewed  ECG    No new tracing  Physical Exam   GEN: No acute distress.   Neck: No JVD Cardiac: RRR, no murmurs, rubs, or gallops.  Respiratory: Clear to auscultation bilaterally. GI: Soft, nontender, non-distended  MS: No edema; No deformity. Neuro:  Nonfocal  Psych: Normal affect   Labs    High Sensitivity Troponin:    Recent Labs  Lab 08/07/21 1535 08/07/21 1916  TROPONINIHS 23* 33*     Chemistry Recent Labs  Lab 08/07/21 1535 08/08/21 0124 08/09/21 0605  NA 138 137 137  K 3.7 3.9 3.7  CL 108 106 107  CO2 19* 22 23  GLUCOSE 103* 125* 91  BUN '18 22 22  '$ CREATININE 0.83 0.93 1.05*  CALCIUM 8.1* 8.6* 8.5*  MG  --   --  1.8  PROT  --   --  5.2*  ALBUMIN  --   --  2.9*  AST  --   --  13*  ALT  --   --  13  ALKPHOS  --   --  62  BILITOT  --   --  0.2*  GFRNONAA >60 >60 52*  ANIONGAP '11 9 7    '$ Lipids  Recent Labs  Lab 08/08/21 0124  CHOL 188  TRIG 81  HDL 67  LDLCALC 105*  CHOLHDL 2.8    Hematology Recent Labs  Lab 08/07/21 1535 08/08/21 0124 08/09/21 0605  WBC 11.1* 10.6* 11.6*  RBC 3.81* 3.89 3.99  HGB 11.4* 11.6* 12.0  HCT 36.1 35.5* 36.3  MCV 94.8 91.3 91.0  MCH 29.9 29.8 30.1  MCHC 31.6 32.7 33.1  RDW 14.2 14.1 14.3  PLT 243 259 251   Thyroid No results for input(s): "TSH", "FREET4" in the last 168 hours.  BNP Recent Labs  Lab 08/07/21 1900  BNP 387.3*    DDimer No results for input(s): "DDIMER" in the last 168 hours.   Radiology    ECHOCARDIOGRAM COMPLETE  Result Date: 08/08/2021    ECHOCARDIOGRAM REPORT   Patient Name:   [name] Date of Exam: 08/08/2021 Medical Rec #:  [id number]        Height:       63.0 in Accession #:    [id number]       Weight:       120.0 lb Date of Birth:  [birth year]        BSA:          1.556 m Patient Age:    55 years         BP:           141/60 mmHg Patient Gender: F                HR:           52 bpm. Exam Location:  Inpatient Procedure: 2D Echo Indications:    chest pain  History:        Patient has prior history of Echocardiogram examinations, most                 recent 09/23/2019. CAD, COPD; Risk Factors:Hypertension and                 Dyslipidemia.  Sonographer:    Johny Chess RDCS Referring Phys: [id number] [place]  1. Left ventricular ejection fraction, by estimation, is 55 to 60%. The left  ventricle has normal function. The left ventricle has no regional wall motion abnormalities. Left ventricular diastolic parameters are consistent with Grade II diastolic dysfunction (pseudonormalization).  2. Right ventricular systolic function is normal. The right ventricular size is normal. There is normal pulmonary artery systolic pressure. The estimated right ventricular systolic pressure is 32.3 mmHg.  3. The mitral valve is grossly normal. Mild mitral valve regurgitation.  4. Tricuspid valve regurgitation is mild to moderate.  5. The aortic valve is tricuspid. There is mild calcification of the aortic valve. There is mild thickening of the aortic valve. Aortic valve regurgitation is not visualized. Aortic valve sclerosis is present, with no evidence of aortic valve stenosis.  6. The inferior vena cava is normal in size with greater than 50% respiratory variability, suggesting right atrial pressure of 3 mmHg. Comparison(s): No significant change from prior study. FINDINGS  Left Ventricle: Left ventricular ejection fraction, by estimation, is 55 to 60%. The left ventricle has normal function. The left ventricle has no regional wall motion abnormalities. Global longitudinal strain performed but not reported based on interpreter judgement due to suboptimal tracking. The left ventricular internal cavity size was normal in size. There is no left ventricular hypertrophy. Left ventricular diastolic parameters are consistent with Grade II diastolic dysfunction (pseudonormalization). Right Ventricle: The right ventricular size is normal. No increase in right ventricular wall thickness. Right ventricular systolic function is normal. There is normal pulmonary artery systolic pressure. The tricuspid regurgitant velocity is 2.66 m/s, and  with an assumed right atrial pressure of 3 mmHg, the estimated  right ventricular systolic pressure is 63.0 mmHg. Left Atrium: Left atrial size was normal in size. Right Atrium: Right  atrial size was normal in size. Pericardium: There is no evidence of pericardial effusion. Mitral Valve: The mitral valve is grossly normal. Mild mitral annular calcification. Mild mitral valve regurgitation. Tricuspid Valve: The tricuspid valve is normal in structure. Tricuspid valve regurgitation is mild to moderate. No evidence of tricuspid stenosis. Aortic Valve: The aortic valve is tricuspid. There is mild calcification of the aortic valve. There is mild thickening of the aortic valve. There is mild aortic valve annular calcification. Aortic valve regurgitation is not visualized. Aortic valve sclerosis is present, with no evidence of aortic valve stenosis. Pulmonic Valve: The pulmonic valve was normal in structure. Pulmonic valve regurgitation is mild. No evidence of pulmonic stenosis. Aorta: The aortic root and ascending aorta are structurally normal, with no evidence of dilitation. Venous: The inferior vena cava is normal in size with greater than 50% respiratory variability, suggesting right atrial pressure of 3 mmHg. IAS/Shunts: No atrial level shunt detected by color flow Doppler.  LEFT VENTRICLE PLAX 2D LVIDd:         4.10 cm   Diastology LVIDs:         2.60 cm   LV e' medial:    8.33 cm/s LV PW:         0.70 cm   LV E/e' medial:  11.7 LV IVS:        0.80 cm   LV e' lateral:   8.63 cm/s LVOT diam:     2.00 cm   LV E/e' lateral: 11.3 LV SV:         58 LV SV Index:   37 LVOT Area:     3.14 cm  RIGHT VENTRICLE             IVC RV Basal diam:  2.60 cm     IVC diam: 2.00 cm RV S prime:     10.90 cm/s TAPSE (M-mode): 2.2 cm LEFT ATRIUM             Index        RIGHT ATRIUM           Index LA diam:        3.30 cm 2.12 cm/m   RA Area:     11.60 cm LA Vol (A2C):   52.2 ml 33.54 ml/m  RA Volume:   24.80 ml  15.94 ml/m LA Vol (A4C):   61.5 ml 39.52 ml/m LA Biplane Vol: 56.9 ml 36.56 ml/m  AORTIC VALVE LVOT Vmax:   76.70 cm/s LVOT Vmean:  48.800 cm/s LVOT VTI:    0.184 m  AORTA Ao Root diam: 2.60 cm Ao Asc  diam:  2.70 cm MITRAL VALVE               TRICUSPID VALVE MV Area (PHT): 3.60 cm    TR Peak grad:   28.3 mmHg MV Decel Time: 211 msec    TR Vmax:        266.00 cm/s MV E velocity: 97.10 cm/s MV A velocity: 73.90 cm/s  SHUNTS MV E/A ratio:  1.31        Systemic VTI:  0.18 m                            Systemic Diam: 2.00 cm Rudean Haskell MD Electronically signed by Rudean Haskell MD Signature Date/Time: 08/08/2021/5:03:52 PM  Final    DG Chest Portable 1 View  Result Date: 08/07/2021 CLINICAL DATA:  Chest pain and shortness of breath since yesterday. EXAM: PORTABLE CHEST 1 VIEW COMPARISON:  06/16/2020 FINDINGS: Patient is slightly rotated to the left. Lungs are adequately inflated without focal airspace consolidation. Mild stable blunting of the right costophrenic angle likely due in part to the degree of rotation. Cardiomediastinal silhouette and remainder of the exam is unchanged. IMPRESSION: No acute cardiopulmonary disease. Electronically Signed   By: Marin Olp M.D.   On: 08/07/2021 16:28    Cardiac Studies   Echo 08/08/2021  1. Left ventricular ejection fraction, by estimation, is 55 to 60%. The  left ventricle has normal function. The left ventricle has no regional  wall motion abnormalities. Left ventricular diastolic parameters are  consistent with Grade II diastolic  dysfunction (pseudonormalization).   2. Right ventricular systolic function is normal. The right ventricular  size is normal. There is normal pulmonary artery systolic pressure. The  estimated right ventricular systolic pressure is 56.7 mmHg.   3. The mitral valve is grossly normal. Mild mitral valve regurgitation.   4. Tricuspid valve regurgitation is mild to moderate.   5. The aortic valve is tricuspid. There is mild calcification of the  aortic valve. There is mild thickening of the aortic valve. Aortic valve  regurgitation is not visualized. Aortic valve sclerosis is present, with  no evidence of aortic  valve stenosis.   6. The inferior vena cava is normal in size with greater than 50%  respiratory variability, suggesting right atrial pressure of 3 mmHg.   Comparison(s): No significant change from prior study.   Patient Profile     87 y.o. female with past medical history of nonobstructive CAD,  hypertension, hyperlipidemia, paroxysmal atrial fibrillation/paroxysmal SVT and pulmonary sarcoidosis who presented for chest pain and worsening dyspnea on exertion evaluation.   Assessment & Plan    1.  Non-STEMI/unstable angina - Hs-troponin 23>>33 -Symptoms resolved on IV heparin. -Echocardiogram with normal LV function and grade 2 diastolic dysfunction -Continue aspirin, beta-blocker and statin  2.  Paroxysmal atrial fibrillation/PSVT -Maintaining sinus rhythm -Eliquis on hold.  On heparin. -Continue Cardizem CD and beta-blocker  3.  Hypertension - BP stable  4. HLD -08/08/2021: Cholesterol 188; HDL 67; LDL Cholesterol 105; Triglycerides 81; VLDL 16  - Continue Crestor 10 mg 3 times per week. -If worsening CAD, consider uptitration.   For questions or updates, please contact [place] Please consult [email or web address] for contact info under        Signed, Leanor Kail, PA  08/09/2021, 10:13 AM     I have personally seen and examined this patient. I agree with the assessment and plan as outlined above.  No chest pain today. Plans for cardiac cath today per the weekend team. She is NPO. All questions answered.   Lauree Chandler 08/09/2021 10:25 AM

## 2021-08-09 NOTE — Progress Notes (Signed)
PT Cancellation Note  Patient Details Name: [name] MRN: [id number] DOB: 23-Oct-1934   Cancelled Treatment:    Reason Eval/Treat Not Completed: Patient not medically ready (pt post cath; still with TR band intact)  Wyona Almas, PT, DPT [place] [facility] [place] 08/09/2021, 2:30 PM

## 2021-08-09 NOTE — Interval H&P Note (Signed)
History and Physical Interval Note:  08/09/2021 12:35 PM  [name]  has presented today for surgery, with the diagnosis of unstable angina.  The various methods of treatment have been discussed with the patient and family. After consideration of risks, benefits and other options for treatment, the patient has consented to  Procedure(s): LEFT HEART CATH AND CORONARY ANGIOGRAPHY (N/A) as a surgical intervention.  The patient's history has been reviewed, patient examined, no change in status, stable for surgery.  I have reviewed the patient's chart and labs.  Questions were answered to the patient's satisfaction.     Sherren Mocha

## 2021-08-09 NOTE — Progress Notes (Signed)
PROGRESS NOTE    [name]  YQI:[id number] DOB: Apr 05, 1934 DOA: 08/07/2021 PCP: [name], MD  Chief Complaint  Patient presents with   Chest Pain    Brief Narrative:   [name] is [name] 87 y.o. female with medical history significant of paroxysmal SVT, CAD, hypertension, hyperlipidemia, carotid artery disease, COPD, sarcoid, bronchiectasis, GERD, low back pain, atrial fibrillation, anemia, CHF, cognitive decline, low compliance bladder presenting with chest pain.  Patient reports 2 days of chest pain.  Initially started feeling unwell yesterday has had increasing shortness of breath and chest heaviness.  Does have some shortness of breath intermittently at baseline but now much more significant and occurs even with prolonged speaking.  Chest pain is described as Takiera Mayo heaviness that is intermittent without specific triggers that she has noticed.  Is associated with shortness of breath when it occurs.  States she tried nitro glycerin today and did have relief.  EMS was called and noted at least 30 seconds of tachycardia on the monitor.  Patient is unsure if this is similar to prior cardiac chest pain.   She denies fevers, chills, abdominal pain, constipation, diarrhea, nausea, vomiting.   ED Course: Vital signs in the ED significant for heart rate in the 50s to 60s, respiratory rate in the teens to 20s, blood pressure in the 387F to 643 systolic.  Lab work-up included BMP with bicarb 19, glucose 23, calcium 8.1.  CBC with mild leukocytosis to 11.1 and hemoglobin 11.4 from baseline of 12-13.  Troponin initially mildly elevated at 23 with repeat pending.  Chest x-ray showed no acute abnormality.  Patient received Nitropaste in the ED.  Cardiology was consulted and will see the patient in requested medicine admission.  Recommended to hold off on heparin for now unless patient has significant rise in troponins overnight.      Assessment & Plan:   Principal Problem:   Chest pain,  rule out acute myocardial infarction Active Problems:   Shortness of breath   CAD (coronary artery disease)   Leukocytosis   Atrial fibrillation (HCC)   Chronic heart failure with preserved ejection fraction (HCC)   Paroxysmal supraventricular tachycardia (HCC)   Ecchymosis   COPD (chronic obstructive pulmonary disease) (HCC)   GERD (gastroesophageal reflux disease)   SARCOIDOSIS, PULMONARY   Low back pain   Anemia   Cognitive decline   Bladder spasm   HLD (hyperlipidemia)   Essential hypertension   Carotid artery disease (HCC)   Bronchiectasis without complication (HCC)   Low compliance bladder   Chest pain   Elevated troponin   Assessment and Plan: * Chest pain, rule out acute myocardial infarction Troponin mildly elevated Echo pending EKG without acute findings Cards recommending LHC -> with mild nonobstructing plaquing in RCA, patent L main with no significant stenosis, patent LAD with mild proximal vessel stenosis, moderate ostial circumflex stenosis estimated at 60-70%, stable coronary anatomy from cardiac cath study in 2017 Resume eliquis 6/20 am Low dose beta blocker per cards   Shortness of breath Noted mostly with exertion Daughter requesting pulmonology consult Patient ambulated today about 60 feet, desat to 91% on RA CXR 6/17 without acute cardio pulm disease Echo with grade II diastolic dysfunction History of sarcoid and COPD noted Will discuss symptoms further with patient and daughter.  May need to consider CT PE protocol.  Not clear to me that pulm c/s is necessary in house, but will discuss.    CAD (coronary artery disease) Cath 2017 with nonobstructive CAD  Appreciate cardiology recs Planning [facility] 6/19 (as above)  Leukocytosis Mild, follow  Chronic heart failure with preserved ejection fraction ([place]) Echo 09/2019 with EF 32-67%, grade 1 diastolic dysfunction  Atrial fibrillation (HCC) eliquis to resume tomorrow Diltiazem Metop  started  Ecchymosis Hematoma noted to L wrist last night, now with bruising Will monitor   Paroxysmal supraventricular tachycardia (HCC) Noted hx Continued av nodal blockers  COPD (chronic obstructive pulmonary disease) (HCC) No exacerbation  GERD (gastroesophageal reflux disease) PPI  Low back pain tramadol  SARCOIDOSIS, PULMONARY Noted, follow outpatient  Anemia trend  Cognitive decline donepezil  Bladder spasm Oxybutynin vibegron          DVT prophylaxis: heparin Code Status: full Family Communication: none Disposition:   Status is: Observation The patient will require care spanning > 2 midnights and should be moved to inpatient because: cards c/s   Consultants:  cards  Procedures:  Cath 1.  Mild nonobstructive plaquing in the RCA (dominant vessel), unchanged from previous study 2.  Patent left main with no significant stenosis 3.  Patent LAD with mild proximal vessel stenosis of 40% 4.  Moderate ostial circumflex stenosis estimated at 60 to 70%, unchanged from the previous study.   The patient has stable coronary anatomy from her prior cardiac catheterization study in 2017.  Recommend continued medical therapy.  Okay to resume apixaban 2.5 mg twice daily tomorrow morning.  Echo IMPRESSIONS     1. Left ventricular ejection fraction, by estimation, is 55 to 60%. The  left ventricle has normal function. The left ventricle has no regional  wall motion abnormalities. Left ventricular diastolic parameters are  consistent with Grade II diastolic  dysfunction (pseudonormalization).   2. Right ventricular systolic function is normal. The right ventricular  size is normal. There is normal pulmonary artery systolic pressure. The  estimated right ventricular systolic pressure is 12.4 mmHg.   3. The mitral valve is grossly normal. Mild mitral valve regurgitation.   4. Tricuspid valve regurgitation is mild to moderate.   5. The aortic valve is  tricuspid. There is mild calcification of the  aortic valve. There is mild thickening of the aortic valve. Aortic valve  regurgitation is not visualized. Aortic valve sclerosis is present, with  no evidence of aortic valve stenosis.   6. The inferior vena cava is normal in size with greater than 50%  respiratory variability, suggesting right atrial pressure of 3 mmHg.   Comparison(s): No significant change from prior study.   Antimicrobials:  Anti-infectives (From admission, onward)    None       Subjective: No new complaints this AM  Objective: Vitals:   08/09/21 1436 08/09/21 1450 08/09/21 1504 08/09/21 1520  BP: 127/66 (!) 111/46 (!) 114/46 (!) 106/47  Pulse: (!) 55 (!) 54 (!) 52 (!) 51  Resp:      Temp:      TempSrc:      SpO2: 97% 95% 96% 96%  Weight:      Height:        Intake/Output Summary (Last 24 hours) at 08/09/2021 1734 Last data filed at 08/09/2021 1500 Gross per 24 hour  Intake 1224.91 ml  Output --  Net 1224.91 ml   Filed Weights   08/07/21 1531 08/08/21 2037  Weight: 54.4 kg 54.4 kg    Examination:  General: No acute distress. Cardiovascular: RRR Lungs: unlabored Abdomen: Soft, nontender, nondistended Neurological: Alert and oriented 3. Moves all extremities 4 with equal strength. Cranial nerves II through XII grossly intact.  Extremities: No clubbing or cyanosis. No edema.  Data Reviewed: I have personally reviewed following labs and imaging studies  CBC: Recent Labs  Lab 08/07/21 1535 08/08/21 0124 08/09/21 0605  WBC 11.1* 10.6* 11.6*  NEUTROABS  --   --  7.1  HGB 11.4* 11.6* 12.0  HCT 36.1 35.5* 36.3  MCV 94.8 91.3 91.0  PLT 243 259 962    Basic Metabolic Panel: Recent Labs  Lab 08/07/21 1535 08/08/21 0124 08/09/21 0605  NA 138 137 137  K 3.7 3.9 3.7  CL 108 106 107  CO2 19* 22 23  GLUCOSE 103* 125* 91  BUN '18 22 22  '$ CREATININE 0.83 0.93 1.05*  CALCIUM 8.1* 8.6* 8.5*  MG  --   --  1.8  PHOS  --   --  3.7     GFR: Estimated Creatinine Clearance: 32.4 mL/min ([name]) (by C-G formula based on SCr of 1.05 mg/dL (H)).  Liver Function Tests: Recent Labs  Lab 08/09/21 0605  AST 13*  ALT 13  ALKPHOS 62  BILITOT 0.2*  PROT 5.2*  ALBUMIN 2.9*    CBG: Recent Labs  Lab 08/08/21 0539  GLUCAP 79     No results found for this or any previous visit (from the past 240 hour(s)).       Radiology Studies: CARDIAC CATHETERIZATION  Result Date: 08/09/2021 1.  Mild nonobstructive plaquing in the RCA (dominant vessel), unchanged from previous study 2.  Patent left main with no significant stenosis 3.  Patent LAD with mild proximal vessel stenosis of 40% 4.  Moderate ostial circumflex stenosis estimated at 60 to 70%, unchanged from the previous study. The patient has stable coronary anatomy from her prior cardiac catheterization study in 2017.  Recommend continued medical therapy.  Okay to resume apixaban 2.5 mg twice daily tomorrow morning.   ECHOCARDIOGRAM COMPLETE  Result Date: 08/08/2021    ECHOCARDIOGRAM REPORT   Patient Name:   [name] Date of Exam: 08/08/2021 Medical Rec #:  [id number]        Height:       63.0 in Accession #:    [id number]       Weight:       120.0 lb Date of Birth:  [birth year]        BSA:          1.556 m Patient Age:    24 years         BP:           141/60 mmHg Patient Gender: F                HR:           52 bpm. Exam Location:  Inpatient Procedure: 2D Echo Indications:    chest pain  History:        Patient has prior history of Echocardiogram examinations, most                 recent 09/23/2019. CAD, COPD; Risk Factors:Hypertension and                 Dyslipidemia.  Sonographer:    Johny Chess RDCS Referring Phys: [id number] Mayo  1. Left ventricular ejection fraction, by estimation, is 55 to 60%. The left ventricle has normal function. The left ventricle has no regional wall motion abnormalities. Left ventricular diastolic parameters are  consistent with Grade II diastolic dysfunction (pseudonormalization).  2. Right ventricular systolic function is normal. The right ventricular  size is normal. There is normal pulmonary artery systolic pressure. The estimated right ventricular systolic pressure is 96.2 mmHg.  3. The mitral valve is grossly normal. Mild mitral valve regurgitation.  4. Tricuspid valve regurgitation is mild to moderate.  5. The aortic valve is tricuspid. There is mild calcification of the aortic valve. There is mild thickening of the aortic valve. Aortic valve regurgitation is not visualized. Aortic valve sclerosis is present, with no evidence of aortic valve stenosis.  6. The inferior vena cava is normal in size with greater than 50% respiratory variability, suggesting right atrial pressure of 3 mmHg. Comparison(s): No significant change from prior study. FINDINGS  Left Ventricle: Left ventricular ejection fraction, by estimation, is 55 to 60%. The left ventricle has normal function. The left ventricle has no regional wall motion abnormalities. Global longitudinal strain performed but not reported based on interpreter judgement due to suboptimal tracking. The left ventricular internal cavity size was normal in size. There is no left ventricular hypertrophy. Left ventricular diastolic parameters are consistent with Grade II diastolic dysfunction (pseudonormalization). Right Ventricle: The right ventricular size is normal. No increase in right ventricular wall thickness. Right ventricular systolic function is normal. There is normal pulmonary artery systolic pressure. The tricuspid regurgitant velocity is 2.66 m/s, and  with an assumed right atrial pressure of 3 mmHg, the estimated right ventricular systolic pressure is 22.9 mmHg. Left Atrium: Left atrial size was normal in size. Right Atrium: Right atrial size was normal in size. Pericardium: There is no evidence of pericardial effusion. Mitral Valve: The mitral valve is grossly normal.  Mild mitral annular calcification. Mild mitral valve regurgitation. Tricuspid Valve: The tricuspid valve is normal in structure. Tricuspid valve regurgitation is mild to moderate. No evidence of tricuspid stenosis. Aortic Valve: The aortic valve is tricuspid. There is mild calcification of the aortic valve. There is mild thickening of the aortic valve. There is mild aortic valve annular calcification. Aortic valve regurgitation is not visualized. Aortic valve sclerosis is present, with no evidence of aortic valve stenosis. Pulmonic Valve: The pulmonic valve was normal in structure. Pulmonic valve regurgitation is mild. No evidence of pulmonic stenosis. Aorta: The aortic root and ascending aorta are structurally normal, with no evidence of dilitation. Venous: The inferior vena cava is normal in size with greater than 50% respiratory variability, suggesting right atrial pressure of 3 mmHg. IAS/Shunts: No atrial level shunt detected by color flow Doppler.  LEFT VENTRICLE PLAX 2D LVIDd:         4.10 cm   Diastology LVIDs:         2.60 cm   LV e' medial:    8.33 cm/s LV PW:         0.70 cm   LV E/e' medial:  11.7 LV IVS:        0.80 cm   LV e' lateral:   8.63 cm/s LVOT diam:     2.00 cm   LV E/e' lateral: 11.3 LV SV:         58 LV SV Index:   37 LVOT Area:     3.14 cm  RIGHT VENTRICLE             IVC RV Basal diam:  2.60 cm     IVC diam: 2.00 cm RV S prime:     10.90 cm/s TAPSE (M-mode): 2.2 cm LEFT ATRIUM             Index        RIGHT  ATRIUM           Index LA diam:        3.30 cm 2.12 cm/m   RA Area:     11.60 cm LA Vol (A2C):   52.2 ml 33.54 ml/m  RA Volume:   24.80 ml  15.94 ml/m LA Vol (A4C):   61.5 ml 39.52 ml/m LA Biplane Vol: 56.9 ml 36.56 ml/m  AORTIC VALVE LVOT Vmax:   76.70 cm/s LVOT Vmean:  48.800 cm/s LVOT VTI:    0.184 m  AORTA Ao Root diam: 2.60 cm Ao Asc diam:  2.70 cm MITRAL VALVE               TRICUSPID VALVE MV Area (PHT): 3.60 cm    TR Peak grad:   28.3 mmHg MV Decel Time: 211 msec    TR  Vmax:        266.00 cm/s MV E velocity: 97.10 cm/s MV Elyan Vanwieren velocity: 73.90 cm/s  SHUNTS MV E/Laronda Lisby ratio:  1.31        Systemic VTI:  0.18 m                            Systemic Diam: 2.00 cm Rudean Haskell MD Electronically signed by Rudean Haskell MD Signature Date/Time: 08/08/2021/5:03:52 PM    Final         Scheduled Meds:  Derrill Memo ON 08/10/2021] apixaban  2.5 mg Oral BID   [START ON 08/10/2021] aspirin EC  81 mg Oral Daily   diltiazem  180 mg Oral Daily   donepezil  5 mg Oral QHS   metoprolol tartrate  12.5 mg Oral BID   mometasone-formoterol  2 puff Inhalation BID   oxybutynin  5 mg Oral QHS   pantoprazole  40 mg Oral Daily   rosuvastatin  10 mg Oral Once per day on Mon Wed Fri   sertraline  25 mg Oral QHS   sodium chloride flush  3 mL Intravenous Q12H   sodium chloride flush  3 mL Intravenous Q12H   Vibegron  75 mg Oral Daily   Continuous Infusions:  sodium chloride     sodium chloride 1 mL/kg/hr (08/09/21 1500)     LOS: 1 day    Time spent: over 30 min    Fayrene Helper, MD [facility]   To contact the attending provider between 7A-7P or the covering provider during after hours 7P-7A, please log into the web site [email or web address] and access using universal [place] password for that web site. If you do not have the password, please call the hospital operator.  08/09/2021, 5:34 PM

## 2021-08-09 NOTE — Progress Notes (Signed)
SATURATION QUALIFICATIONS: (This note is used to comply with regulatory documentation for home oxygen)  Patient Saturations on Room Air at Rest = 96%  Patient Saturations on Room Air while Ambulating = 91%

## 2021-08-09 NOTE — Progress Notes (Addendum)
Progress Note  Patient Name: [name] Date of Encounter: 08/09/2021  [facility] Cardiologist: Sherren Mocha, MD   Subjective   Chest pain and dyspnea resolved with addition of heparin.  No palpitation.  Inpatient Medications    Scheduled Meds:  [START ON 08/10/2021] aspirin EC  81 mg Oral Daily   diltiazem  180 mg Oral Daily   donepezil  5 mg Oral QHS   metoprolol tartrate  12.5 mg Oral BID   mometasone-formoterol  2 puff Inhalation BID   oxybutynin  5 mg Oral QHS   pantoprazole  40 mg Oral Daily   rosuvastatin  10 mg Oral Once per day on Mon Wed Fri   sertraline  25 mg Oral QHS   sodium chloride flush  3 mL Intravenous Q12H   Vibegron  75 mg Oral Daily   Continuous Infusions:  sodium chloride     sodium chloride 1 mL/kg/hr (08/09/21 0605)   heparin 750 Units/hr (08/08/21 1240)   PRN Meds: sodium chloride, acetaminophen, albuterol, nitroGLYCERIN, ondansetron (ZOFRAN) IV, sodium chloride flush, traMADol   Vital Signs    Vitals:   08/08/21 2045 08/08/21 2241 08/09/21 0551 08/09/21 0840  BP:   (!) 142/56   Pulse:  (!) 49 62 (!) 52  Resp:   18 16  Temp:   97.9 F (36.6 C)   TempSrc:   Oral   SpO2: 97%   95%  Weight:      Height:        Intake/Output Summary (Last 24 hours) at 08/09/2021 1013 Last data filed at 08/09/2021 0700 Gross per 24 hour  Intake 463.78 ml  Output --  Net 463.78 ml      08/08/2021    8:37 PM 08/07/2021    3:31 PM 03/17/2021    2:43 PM  Last 3 Weights  Weight (lbs) 119 lb 15.9 oz 120 lb 121 lb  Weight (kg) 54.43 kg 54.432 kg 54.885 kg      Telemetry    Normal sinus rhythm- Personally Reviewed  ECG    No new tracing  Physical Exam   GEN: No acute distress.   Neck: No JVD Cardiac: RRR, no murmurs, rubs, or gallops.  Respiratory: Clear to auscultation bilaterally. GI: Soft, nontender, non-distended  MS: No edema; No deformity. Neuro:  Nonfocal  Psych: Normal affect   Labs    High Sensitivity Troponin:    Recent Labs  Lab 08/07/21 1535 08/07/21 1916  TROPONINIHS 23* 33*     Chemistry Recent Labs  Lab 08/07/21 1535 08/08/21 0124 08/09/21 0605  NA 138 137 137  K 3.7 3.9 3.7  CL 108 106 107  CO2 19* 22 23  GLUCOSE 103* 125* 91  BUN '18 22 22  '$ CREATININE 0.83 0.93 1.05*  CALCIUM 8.1* 8.6* 8.5*  MG  --   --  1.8  PROT  --   --  5.2*  ALBUMIN  --   --  2.9*  AST  --   --  13*  ALT  --   --  13  ALKPHOS  --   --  62  BILITOT  --   --  0.2*  GFRNONAA >60 >60 52*  ANIONGAP '11 9 7    '$ Lipids  Recent Labs  Lab 08/08/21 0124  CHOL 188  TRIG 81  HDL 67  LDLCALC 105*  CHOLHDL 2.8    Hematology Recent Labs  Lab 08/07/21 1535 08/08/21 0124 08/09/21 0605  WBC 11.1* 10.6* 11.6*  RBC 3.81* 3.89 3.99  HGB 11.4* 11.6* 12.0  HCT 36.1 35.5* 36.3  MCV 94.8 91.3 91.0  MCH 29.9 29.8 30.1  MCHC 31.6 32.7 33.1  RDW 14.2 14.1 14.3  PLT 243 259 251   Thyroid No results for input(s): "TSH", "FREET4" in the last 168 hours.  BNP Recent Labs  Lab 08/07/21 1900  BNP 387.3*    DDimer No results for input(s): "DDIMER" in the last 168 hours.   Radiology    ECHOCARDIOGRAM COMPLETE  Result Date: 08/08/2021    ECHOCARDIOGRAM REPORT   Patient Name:   [name] Date of Exam: 08/08/2021 Medical Rec #:  [id number]        Height:       63.0 in Accession #:    [id number]       Weight:       120.0 lb Date of Birth:  Nov 12, 1934        BSA:          1.556 m Patient Age:    87 years         BP:           141/60 mmHg Patient Gender: F                HR:           52 bpm. Exam Location:  Inpatient Procedure: 2D Echo Indications:    chest pain  History:        Patient has prior history of Echocardiogram examinations, most                 recent 09/23/2019. CAD, COPD; Risk Factors:Hypertension and                 Dyslipidemia.  Sonographer:    Johny Chess RDCS Referring Phys: [id number] [place]  1. Left ventricular ejection fraction, by estimation, is 55 to 60%. The left  ventricle has normal function. The left ventricle has no regional wall motion abnormalities. Left ventricular diastolic parameters are consistent with Grade II diastolic dysfunction (pseudonormalization).  2. Right ventricular systolic function is normal. The right ventricular size is normal. There is normal pulmonary artery systolic pressure. The estimated right ventricular systolic pressure is 61.6 mmHg.  3. The mitral valve is grossly normal. Mild mitral valve regurgitation.  4. Tricuspid valve regurgitation is mild to moderate.  5. The aortic valve is tricuspid. There is mild calcification of the aortic valve. There is mild thickening of the aortic valve. Aortic valve regurgitation is not visualized. Aortic valve sclerosis is present, with no evidence of aortic valve stenosis.  6. The inferior vena cava is normal in size with greater than 50% respiratory variability, suggesting right atrial pressure of 3 mmHg. Comparison(s): No significant change from prior study. FINDINGS  Left Ventricle: Left ventricular ejection fraction, by estimation, is 55 to 60%. The left ventricle has normal function. The left ventricle has no regional wall motion abnormalities. Global longitudinal strain performed but not reported based on interpreter judgement due to suboptimal tracking. The left ventricular internal cavity size was normal in size. There is no left ventricular hypertrophy. Left ventricular diastolic parameters are consistent with Grade II diastolic dysfunction (pseudonormalization). Right Ventricle: The right ventricular size is normal. No increase in right ventricular wall thickness. Right ventricular systolic function is normal. There is normal pulmonary artery systolic pressure. The tricuspid regurgitant velocity is 2.66 m/s, and  with an assumed right atrial pressure of 3 mmHg, the estimated  right ventricular systolic pressure is 52.7 mmHg. Left Atrium: Left atrial size was normal in size. Right Atrium: Right  atrial size was normal in size. Pericardium: There is no evidence of pericardial effusion. Mitral Valve: The mitral valve is grossly normal. Mild mitral annular calcification. Mild mitral valve regurgitation. Tricuspid Valve: The tricuspid valve is normal in structure. Tricuspid valve regurgitation is mild to moderate. No evidence of tricuspid stenosis. Aortic Valve: The aortic valve is tricuspid. There is mild calcification of the aortic valve. There is mild thickening of the aortic valve. There is mild aortic valve annular calcification. Aortic valve regurgitation is not visualized. Aortic valve sclerosis is present, with no evidence of aortic valve stenosis. Pulmonic Valve: The pulmonic valve was normal in structure. Pulmonic valve regurgitation is mild. No evidence of pulmonic stenosis. Aorta: The aortic root and ascending aorta are structurally normal, with no evidence of dilitation. Venous: The inferior vena cava is normal in size with greater than 50% respiratory variability, suggesting right atrial pressure of 3 mmHg. IAS/Shunts: No atrial level shunt detected by color flow Doppler.  LEFT VENTRICLE PLAX 2D LVIDd:         4.10 cm   Diastology LVIDs:         2.60 cm   LV e' medial:    8.33 cm/s LV PW:         0.70 cm   LV E/e' medial:  11.7 LV IVS:        0.80 cm   LV e' lateral:   8.63 cm/s LVOT diam:     2.00 cm   LV E/e' lateral: 11.3 LV SV:         58 LV SV Index:   37 LVOT Area:     3.14 cm  RIGHT VENTRICLE             IVC RV Basal diam:  2.60 cm     IVC diam: 2.00 cm RV S prime:     10.90 cm/s TAPSE (M-mode): 2.2 cm LEFT ATRIUM             Index        RIGHT ATRIUM           Index LA diam:        3.30 cm 2.12 cm/m   RA Area:     11.60 cm LA Vol (A2C):   52.2 ml 33.54 ml/m  RA Volume:   24.80 ml  15.94 ml/m LA Vol (A4C):   61.5 ml 39.52 ml/m LA Biplane Vol: 56.9 ml 36.56 ml/m  AORTIC VALVE LVOT Vmax:   76.70 cm/s LVOT Vmean:  48.800 cm/s LVOT VTI:    0.184 m  AORTA Ao Root diam: 2.60 cm Ao Asc  diam:  2.70 cm MITRAL VALVE               TRICUSPID VALVE MV Area (PHT): 3.60 cm    TR Peak grad:   28.3 mmHg MV Decel Time: 211 msec    TR Vmax:        266.00 cm/s MV E velocity: 97.10 cm/s MV A velocity: 73.90 cm/s  SHUNTS MV E/A ratio:  1.31        Systemic VTI:  0.18 m                            Systemic Diam: 2.00 cm Rudean Haskell MD Electronically signed by Rudean Haskell MD Signature Date/Time: 08/08/2021/5:03:52 PM  Final    DG Chest Portable 1 View  Result Date: 08/07/2021 CLINICAL DATA:  Chest pain and shortness of breath since yesterday. EXAM: PORTABLE CHEST 1 VIEW COMPARISON:  06/16/2020 FINDINGS: Patient is slightly rotated to the left. Lungs are adequately inflated without focal airspace consolidation. Mild stable blunting of the right costophrenic angle likely due in part to the degree of rotation. Cardiomediastinal silhouette and remainder of the exam is unchanged. IMPRESSION: No acute cardiopulmonary disease. Electronically Signed   By: Marin Olp M.D.   On: 08/07/2021 16:28    Cardiac Studies   Echo 08/08/2021  1. Left ventricular ejection fraction, by estimation, is 55 to 60%. The  left ventricle has normal function. The left ventricle has no regional  wall motion abnormalities. Left ventricular diastolic parameters are  consistent with Grade II diastolic  dysfunction (pseudonormalization).   2. Right ventricular systolic function is normal. The right ventricular  size is normal. There is normal pulmonary artery systolic pressure. The  estimated right ventricular systolic pressure is 78.9 mmHg.   3. The mitral valve is grossly normal. Mild mitral valve regurgitation.   4. Tricuspid valve regurgitation is mild to moderate.   5. The aortic valve is tricuspid. There is mild calcification of the  aortic valve. There is mild thickening of the aortic valve. Aortic valve  regurgitation is not visualized. Aortic valve sclerosis is present, with  no evidence of aortic  valve stenosis.   6. The inferior vena cava is normal in size with greater than 50%  respiratory variability, suggesting right atrial pressure of 3 mmHg.   Comparison(s): No significant change from prior study.   Patient Profile     87 y.o. female with past medical history of nonobstructive CAD,  hypertension, hyperlipidemia, paroxysmal atrial fibrillation/paroxysmal SVT and pulmonary sarcoidosis who presented for chest pain and worsening dyspnea on exertion evaluation.   Assessment & Plan    1.  Non-STEMI/unstable angina - Hs-troponin 23>>33 -Symptoms resolved on IV heparin. -Echocardiogram with normal LV function and grade 2 diastolic dysfunction -Continue aspirin, beta-blocker and statin  2.  Paroxysmal atrial fibrillation/PSVT -Maintaining sinus rhythm -Eliquis on hold.  On heparin. -Continue Cardizem CD and beta-blocker  3.  Hypertension - BP stable  4. HLD -08/08/2021: Cholesterol 188; HDL 67; LDL Cholesterol 105; Triglycerides 81; VLDL 16  - Continue Crestor 10 mg 3 times per week. -If worsening CAD, consider uptitration.   For questions or updates, please contact [place] Please consult [email or web address] for contact info under        Signed, Leanor Kail, PA  08/09/2021, 10:13 AM     I have personally seen and examined this patient. I agree with the assessment and plan as outlined above.  No chest pain today. Plans for cardiac cath today per the weekend team. She is NPO. All questions answered.   Lauree Chandler 08/09/2021 10:25 AM

## 2021-08-09 NOTE — Progress Notes (Signed)
ANTICOAGULATION CONSULT NOTE - Follow Up Consult  Pharmacy Consult for IV heparin Indication: NSTEMI  Allergies  Allergen Reactions   Levaquin [Levofloxacin] Other (See Comments)    Joint pain.. Per doctor not to take again   Sulfonamide Derivatives Other (See Comments)    Possibly caused hepatitis   Oxycodone Nausea Only    Can take oxycodone with Zofran   Sulfa Antibiotics Other (See Comments)    Possibly caused hepatitis   Morphine Nausea Only    Patient Measurements: Height: '5\' 3"'$  (160 cm) Weight: 54.4 kg (119 lb 15.9 oz) IBW/kg (Calculated) : 52.4 Heparin Dosing Weight: 54.4 kg   Vital Signs: Temp: 97.9 F (36.6 C) (06/19 0551) Temp Source: Oral (06/19 0551) BP: 142/56 (06/19 0551) Pulse Rate: 52 (06/19 0840)  Labs: Recent Labs    08/07/21 1535 08/07/21 1916 08/07/21 2150 08/08/21 0124 08/08/21 1104 08/08/21 2008 08/09/21 0605  HGB 11.4*  --   --  11.6*  --   --  12.0  HCT 36.1  --   --  35.5*  --   --  36.3  PLT 243  --   --  259  --   --  251  APTT  --   --    < > 24 63* 89* 115*  HEPARINUNFRC  --   --    < > 0.16* 0.63 0.76* 1.03*  CREATININE 0.83  --   --  0.93  --   --  1.05*  TROPONINIHS 23* 33*  --   --   --   --   --    < > = values in this interval not displayed.     Estimated Creatinine Clearance: 32.4 mL/min (A) (by C-G formula based on SCr of 1.05 mg/dL (H)).   Assessment: 49 yoF with hx AFib and CAD admitted with NSTEMI. Pt on apixaban PTA, on hold for IV heparin.  aPTT this am is supratherapeutic at 115 seconds, Heparin level still altered by DOAC, CBC stable.  Goal of Therapy:  Heparin level 0.3-0.7 units/ml aPTT 66-102 seconds Monitor platelets by anticoagulation protocol: Yes   Plan:  Reduce heparin to 650 units/h Defer recheck with cath today  Arrie Senate, PharmD, BCPS, [facility] Clinical Pharmacist [phone] Please check AMION for all [facility] numbers 08/09/2021

## 2021-08-09 NOTE — Telephone Encounter (Addendum)
Spoke to patient's daughter, [name](DPR). [name] wanted to make Dr. [name] aware that patient is currently admitted at [facility]. [name] would like patient to seen by pulmonary due to increased SOB. [name] is aware that admitting MD will need to request pulmonary consult.  [name] is also requesting outpatient f/u with RB. First available 09/28/2021. She would like an appt sooner.   Dr. [name], please advise. Thanks

## 2021-08-09 NOTE — Progress Notes (Signed)
Mobility Specialist Progress Note    08/09/21 1625  Mobility  Activity Ambulated with assistance in hallway  Level of Assistance Minimal assist, patient does 75% or more  Assistive Device Front wheel walker  Distance Ambulated (ft) 60 ft (10+50)  Activity Response Tolerated well  $Mobility charge 1 Mobility   Pre-Mobility: 96% SpO2 During Mobility: 91% SpO2 Post-Mobility: 53 HR, 96% SpO2  Pt received in bed and agreeable. Had void in BR. C/o SOB in doorway upon return. Left in bed with call bell in reach.    Hildred Alamin Mobility Specialist

## 2021-08-09 NOTE — Assessment & Plan Note (Signed)
Noted mostly with exertion Daughter requesting pulmonology consult Patient ambulated today about 60 feet, desat to 91% on RA CXR 6/17 without acute cardio pulm disease Echo with grade II diastolic dysfunction History of sarcoid and COPD noted Will discuss symptoms further with patient and daughter.  May need to consider CT PE protocol.  Not clear to me that pulm c/s is necessary in house, but will discuss.

## 2021-08-10 ENCOUNTER — Inpatient Hospital Stay (HOSPITAL_COMMUNITY): Payer: Medicare Other

## 2021-08-10 DIAGNOSIS — I25118 Atherosclerotic heart disease of native coronary artery with other forms of angina pectoris: Secondary | ICD-10-CM | POA: Diagnosis not present

## 2021-08-10 DIAGNOSIS — R079 Chest pain, unspecified: Secondary | ICD-10-CM | POA: Diagnosis not present

## 2021-08-10 LAB — CBC WITH DIFFERENTIAL/PLATELET
Abs Immature Granulocytes: 0.74 10*3/uL — ABNORMAL HIGH (ref 0.00–0.07)
Basophils Absolute: 0 10*3/uL (ref 0.0–0.1)
Basophils Relative: 0 %
Eosinophils Absolute: 0.2 10*3/uL (ref 0.0–0.5)
Eosinophils Relative: 3 %
HCT: 34.8 % — ABNORMAL LOW (ref 36.0–46.0)
Hemoglobin: 10.6 g/dL — ABNORMAL LOW (ref 12.0–15.0)
Immature Granulocytes: 8 %
Lymphocytes Relative: 18 %
Lymphs Abs: 1.7 10*3/uL (ref 0.7–4.0)
MCH: 29.4 pg (ref 26.0–34.0)
MCHC: 30.5 g/dL (ref 30.0–36.0)
MCV: 96.4 fL (ref 80.0–100.0)
Monocytes Absolute: 0.8 10*3/uL (ref 0.1–1.0)
Monocytes Relative: 8 %
Neutro Abs: 5.9 10*3/uL (ref 1.7–7.7)
Neutrophils Relative %: 63 %
Platelets: 230 10*3/uL (ref 150–400)
RBC: 3.61 MIL/uL — ABNORMAL LOW (ref 3.87–5.11)
RDW: 14.6 % (ref 11.5–15.5)
WBC: 9.4 10*3/uL (ref 4.0–10.5)
nRBC: 0 % (ref 0.0–0.2)

## 2021-08-10 LAB — COMPREHENSIVE METABOLIC PANEL
ALT: 9 U/L (ref 0–44)
AST: 11 U/L — ABNORMAL LOW (ref 15–41)
Albumin: 2.6 g/dL — ABNORMAL LOW (ref 3.5–5.0)
Alkaline Phosphatase: 59 U/L (ref 38–126)
Anion gap: 7 (ref 5–15)
BUN: 18 mg/dL (ref 8–23)
CO2: 21 mmol/L — ABNORMAL LOW (ref 22–32)
Calcium: 8.1 mg/dL — ABNORMAL LOW (ref 8.9–10.3)
Chloride: 105 mmol/L (ref 98–111)
Creatinine, Ser: 0.96 mg/dL (ref 0.44–1.00)
GFR, Estimated: 58 mL/min — ABNORMAL LOW (ref >60)
Glucose, Bld: 84 mg/dL (ref 70–99)
Potassium: 3.7 mmol/L (ref 3.5–5.1)
Sodium: 133 mmol/L — ABNORMAL LOW (ref 135–145)
Total Bilirubin: 0.3 mg/dL (ref 0.3–1.2)
Total Protein: 4.7 g/dL — ABNORMAL LOW (ref 6.5–8.1)

## 2021-08-10 LAB — PHOSPHORUS: Phosphorus: 3.7 mg/dL (ref 2.5–4.6)

## 2021-08-10 LAB — MAGNESIUM: Magnesium: 1.8 mg/dL (ref 1.7–2.4)

## 2021-08-10 MED ORDER — METOPROLOL TARTRATE 25 MG PO TABS: 12.5000 mg | ORAL_TABLET | Freq: Two times a day (BID) | ORAL | 0 refills | Status: DC

## 2021-08-10 NOTE — Evaluation (Signed)
Physical Therapy Evaluation Patient Details Name: [name] MRN: [id number] DOB: 1934/11/28 Today's Date: 08/10/2021  History of Present Illness  Pt adm 6/17 with chest pain and SOB. Pt with NSTEMI.  PMH - CAD, htn, cad, copd, sarcoid bronchiectasis, afib, chf  Clinical Impression  Pt doing well with mobility and no further PT needed.  Recommended to pt that she use her rolling walker initially at home. Ready for dc from PT standpoint.        Recommendations for follow up therapy are one component of a multi-disciplinary discharge planning process, led by the attending physician.  Recommendations may be updated based on patient status, additional functional criteria and insurance authorization.  Follow Up Recommendations No PT follow up    Assistance Recommended at Discharge PRN  Patient can return home with the following       Equipment Recommendations None recommended by PT  Recommendations for Other Services       Functional Status Assessment Patient has not had a recent decline in their functional status     Precautions / Restrictions Precautions Precautions: Fall Restrictions Weight Bearing Restrictions: No      Mobility  Bed Mobility Overal bed mobility: Modified Independent             General bed mobility comments: Incr time    Transfers Overall transfer level: Modified independent Equipment used: Rolling walker (2 wheels), None               General transfer comment: Modified independent standing with walker. Min guard without assistive device    Ambulation/Gait Ambulation/Gait assistance: Supervision, Min guard Gait Distance (Feet): 200 Feet Assistive device: 1 person hand held assist, Rolling walker (2 wheels) Gait Pattern/deviations: Step-through pattern, Decreased stride length, Narrow base of support Gait velocity: decr Gait velocity interpretation: 1.31 - 2.62 ft/sec, indicative of limited community ambulator   General Gait  Details: Min guard for stability with hand held assist. With rolling walker more stable and only supervision for safety  Stairs            Wheelchair Mobility    Modified Rankin (Stroke Patients Only)       Balance Overall balance assessment: Needs assistance Sitting-balance support: No upper extremity supported, Feet supported Sitting balance-Leahy Scale: Good     Standing balance support: No upper extremity supported, During functional activity Standing balance-Leahy Scale: Fair                               Pertinent Vitals/Pain Pain Assessment Pain Assessment: No/denies pain    Home Living Family/patient expects to be discharged to:: Private residence Living Arrangements: Children (daughter and son in law) Available Help at Discharge: Family;Available 24 hours/day Type of Home: House Home Access: Stairs to enter   [employer] of Steps: 1   Home Layout: One level Home Equipment: Conservation officer, nature (2 wheels);BSC/3in1;Shower seat      Prior Function Prior Level of Function : Independent/Modified Independent;Driving             Mobility Comments: Does not use assistive device       Hand Dominance   Dominant Hand: Right    Extremity/Trunk Assessment   Upper Extremity Assessment Upper Extremity Assessment: Overall WFL for tasks assessed    Lower Extremity Assessment Lower Extremity Assessment: Generalized weakness       Communication   Communication: No difficulties  Cognition Arousal/Alertness: Awake/alert Behavior During Therapy: WFL for  tasks assessed/performed Overall Cognitive Status: Within Functional Limits for tasks assessed                                          General Comments General comments (skin integrity, edema, etc.): VSS on RA    Exercises     Assessment/Plan    PT Assessment Patient does not need any further PT services  PT Problem List         PT Treatment Interventions       PT Goals (Current goals can be found in the Care Plan section)  Acute Rehab PT Goals PT Goal Formulation: All assessment and education complete, DC therapy    Frequency       Co-evaluation               AM-PAC PT "6 Clicks" Mobility  Outcome Measure Help needed turning from your back to your side while in a flat bed without using bedrails?: None Help needed moving from lying on your back to sitting on the side of a flat bed without using bedrails?: None Help needed moving to and from a bed to a chair (including a wheelchair)?: A Little Help needed standing up from a chair using your arms (e.g., wheelchair or bedside chair)?: None Help needed to walk in hospital room?: A Little Help needed climbing 3-5 steps with a railing? : A Little 6 Click Score: 21    End of Session   Activity Tolerance: Patient tolerated treatment well Patient left: in chair;with call bell/phone within reach Nurse Communication: Mobility status PT Visit Diagnosis: Muscle weakness (generalized) (M62.81);Unsteadiness on feet (R26.81)    Time: 7867-6720 PT Time Calculation (min) (ACUTE ONLY): 24 min   Charges:   PT Evaluation $PT Eval Moderate Complexity: 1 Mod PT Treatments $Gait Training: 8-22 mins        [place] [facility] [place] 08/10/2021, 10:44 AM

## 2021-08-10 NOTE — Telephone Encounter (Signed)
We will work on getting her seen sooner as she is discharged from th hospital.

## 2021-08-10 NOTE — Progress Notes (Addendum)
Progress Note  Patient Name: [name] Date of Encounter: 08/10/2021  [facility] Cardiologist: Sherren Mocha, MD   Subjective   No complaints this morning.   Inpatient Medications    Scheduled Meds:  apixaban  2.5 mg Oral BID   aspirin EC  81 mg Oral Daily   diltiazem  180 mg Oral Daily   donepezil  5 mg Oral QHS   metoprolol tartrate  12.5 mg Oral BID   mometasone-formoterol  2 puff Inhalation BID   oxybutynin  5 mg Oral QHS   pantoprazole  40 mg Oral Daily   rosuvastatin  10 mg Oral Once per day on Mon Wed Fri   sertraline  25 mg Oral QHS   sodium chloride flush  3 mL Intravenous Q12H   sodium chloride flush  3 mL Intravenous Q12H   Vibegron  75 mg Oral Daily   Continuous Infusions:  sodium chloride     PRN Meds: sodium chloride, acetaminophen, albuterol, nitroGLYCERIN, ondansetron (ZOFRAN) IV, sodium chloride flush, traMADol   Vital Signs    Vitals:   08/09/21 2000 08/09/21 2110 08/10/21 0506 08/10/21 0818  BP: (!) 104/49 106/62 (!) 128/56 (!) 106/54  Pulse: (!) 52 61  62  Resp:  '16 18 18  '$ Temp: (!) 97.4 F (36.3 C)  97.8 F (36.6 C) 97.9 F (36.6 C)  TempSrc: Oral  Oral Oral  SpO2: 94% 94%  95%  Weight:      Height:        Intake/Output Summary (Last 24 hours) at 08/10/2021 1032 Last data filed at 08/10/2021 0800 Gross per 24 hour  Intake 1001.13 ml  Output --  Net 1001.13 ml      08/08/2021    8:37 PM 08/07/2021    3:31 PM 03/17/2021    2:43 PM  Last 3 Weights  Weight (lbs) 119 lb 15.9 oz 120 lb 121 lb  Weight (kg) 54.43 kg 54.432 kg 54.885 kg      Telemetry    Sinus Rhythm - Personally Reviewed  ECG    No new tracing  Physical Exam   GEN: No acute distress.   Neck: No JVD Cardiac: RRR, no murmurs, rubs, or gallops.  Respiratory: Clear to auscultation bilaterally. GI: Soft, nontender, non-distended  MS: No edema; No deformity. Right radial cath site stable. Various bruising noted to arms Neuro:  Nonfocal  Psych:  Normal affect   Labs    High Sensitivity Troponin:   Recent Labs  Lab 08/07/21 1535 08/07/21 1916  TROPONINIHS 23* 33*     Chemistry Recent Labs  Lab 08/08/21 0124 08/09/21 0605 08/10/21 0242  NA 137 137 133*  K 3.9 3.7 3.7  CL 106 107 105  CO2 22 23 21*  GLUCOSE 125* 91 84  BUN '22 22 18  '$ CREATININE 0.93 1.05* 0.96  CALCIUM 8.6* 8.5* 8.1*  MG  --  1.8 1.8  PROT  --  5.2* 4.7*  ALBUMIN  --  2.9* 2.6*  AST  --  13* 11*  ALT  --  13 9  ALKPHOS  --  62 59  BILITOT  --  0.2* 0.3  GFRNONAA >60 52* 58*  ANIONGAP '9 7 7    '$ Lipids  Recent Labs  Lab 08/08/21 0124  CHOL 188  TRIG 81  HDL 67  LDLCALC 105*  CHOLHDL 2.8    Hematology Recent Labs  Lab 08/08/21 0124 08/09/21 0605 08/10/21 0242  WBC 10.6* 11.6* 9.4  RBC 3.89 3.99  3.61*  HGB 11.6* 12.0 10.6*  HCT 35.5* 36.3 34.8*  MCV 91.3 91.0 96.4  MCH 29.8 30.1 29.4  MCHC 32.7 33.1 30.5  RDW 14.1 14.3 14.6  PLT 259 251 230   Thyroid No results for input(s): "TSH", "FREET4" in the last 168 hours.  BNP Recent Labs  Lab 08/07/21 1900  BNP 387.3*    DDimer No results for input(s): "DDIMER" in the last 168 hours.   Radiology    CT CHEST WO CONTRAST  Result Date: 08/10/2021 CLINICAL DATA:  Respiratory illness. EXAM: CT CHEST WITHOUT CONTRAST TECHNIQUE: Multidetector CT imaging of the chest was performed following the standard protocol without IV contrast. RADIATION DOSE REDUCTION: This exam was performed according to the departmental dose-optimization program which includes automated exposure control, adjustment of the mA and/or kV according to patient size and/or use of iterative reconstruction technique. COMPARISON:  November 05, 2019. FINDINGS: Cardiovascular: Atherosclerosis of thoracic aorta is noted without aneurysm formation. Normal cardiac size. No pericardial effusion. Coronary artery calcifications are noted. Mediastinum/Nodes: Thyroid gland is unremarkable. Calcified mediastinal lymph nodes are noted  most consistent with prior granulomatous disease. Small sliding-type hiatal hernia is noted. Lungs/Pleura: No pneumothorax is noted. Small left pleural effusion is noted. Stable right apical and basilar scarring is noted. No acute consolidation is noted. Upper Abdomen: No acute abnormality. Musculoskeletal: No chest wall mass or suspicious bone lesions identified. Status post left shoulder arthroplasty. IMPRESSION: Small left pleural effusion is noted. Stable right lung scarring is noted. No acute consolidative process is noted. Small sliding-type hiatal hernia. Calcified mediastinal adenopathy most consistent with prior granulomatous disease. Coronary calcifications are noted. Aortic Atherosclerosis (ICD10-I70.0). Electronically Signed   By: Marijo Conception M.D.   On: 08/10/2021 09:23   CARDIAC CATHETERIZATION  Result Date: 08/09/2021 1.  Mild nonobstructive plaquing in the RCA (dominant vessel), unchanged from previous study 2.  Patent left main with no significant stenosis 3.  Patent LAD with mild proximal vessel stenosis of 40% 4.  Moderate ostial circumflex stenosis estimated at 60 to 70%, unchanged from the previous study. The patient has stable coronary anatomy from her prior cardiac catheterization study in 2017.  Recommend continued medical therapy.  Okay to resume apixaban 2.5 mg twice daily tomorrow morning.   ECHOCARDIOGRAM COMPLETE  Result Date: 08/08/2021    ECHOCARDIOGRAM REPORT   Patient Name:   [name] Date of Exam: 08/08/2021 Medical Rec #:  [id number]        Height:       63.0 in Accession #:    [id number]       Weight:       120.0 lb Date of Birth:  April 01, 1934        BSA:          1.556 m Patient Age:    87 years         BP:           141/60 mmHg Patient Gender: F                HR:           52 bpm. Exam Location:  Inpatient Procedure: 2D Echo Indications:    chest pain  History:        Patient has prior history of Echocardiogram examinations, most                 recent 09/23/2019.  CAD, COPD; Risk Factors:Hypertension and  Dyslipidemia.  Sonographer:    Johny Chess RDCS Referring Phys: [id number] [place]  1. Left ventricular ejection fraction, by estimation, is 55 to 60%. The left ventricle has normal function. The left ventricle has no regional wall motion abnormalities. Left ventricular diastolic parameters are consistent with Grade II diastolic dysfunction (pseudonormalization).  2. Right ventricular systolic function is normal. The right ventricular size is normal. There is normal pulmonary artery systolic pressure. The estimated right ventricular systolic pressure is 32.4 mmHg.  3. The mitral valve is grossly normal. Mild mitral valve regurgitation.  4. Tricuspid valve regurgitation is mild to moderate.  5. The aortic valve is tricuspid. There is mild calcification of the aortic valve. There is mild thickening of the aortic valve. Aortic valve regurgitation is not visualized. Aortic valve sclerosis is present, with no evidence of aortic valve stenosis.  6. The inferior vena cava is normal in size with greater than 50% respiratory variability, suggesting right atrial pressure of 3 mmHg. Comparison(s): No significant change from prior study. FINDINGS  Left Ventricle: Left ventricular ejection fraction, by estimation, is 55 to 60%. The left ventricle has normal function. The left ventricle has no regional wall motion abnormalities. Global longitudinal strain performed but not reported based on interpreter judgement due to suboptimal tracking. The left ventricular internal cavity size was normal in size. There is no left ventricular hypertrophy. Left ventricular diastolic parameters are consistent with Grade II diastolic dysfunction (pseudonormalization). Right Ventricle: The right ventricular size is normal. No increase in right ventricular wall thickness. Right ventricular systolic function is normal. There is normal pulmonary artery systolic  pressure. The tricuspid regurgitant velocity is 2.66 m/s, and  with an assumed right atrial pressure of 3 mmHg, the estimated right ventricular systolic pressure is 40.1 mmHg. Left Atrium: Left atrial size was normal in size. Right Atrium: Right atrial size was normal in size. Pericardium: There is no evidence of pericardial effusion. Mitral Valve: The mitral valve is grossly normal. Mild mitral annular calcification. Mild mitral valve regurgitation. Tricuspid Valve: The tricuspid valve is normal in structure. Tricuspid valve regurgitation is mild to moderate. No evidence of tricuspid stenosis. Aortic Valve: The aortic valve is tricuspid. There is mild calcification of the aortic valve. There is mild thickening of the aortic valve. There is mild aortic valve annular calcification. Aortic valve regurgitation is not visualized. Aortic valve sclerosis is present, with no evidence of aortic valve stenosis. Pulmonic Valve: The pulmonic valve was normal in structure. Pulmonic valve regurgitation is mild. No evidence of pulmonic stenosis. Aorta: The aortic root and ascending aorta are structurally normal, with no evidence of dilitation. Venous: The inferior vena cava is normal in size with greater than 50% respiratory variability, suggesting right atrial pressure of 3 mmHg. IAS/Shunts: No atrial level shunt detected by color flow Doppler.  LEFT VENTRICLE PLAX 2D LVIDd:         4.10 cm   Diastology LVIDs:         2.60 cm   LV e' medial:    8.33 cm/s LV PW:         0.70 cm   LV E/e' medial:  11.7 LV IVS:        0.80 cm   LV e' lateral:   8.63 cm/s LVOT diam:     2.00 cm   LV E/e' lateral: 11.3 LV SV:         58 LV SV Index:   37 LVOT Area:     3.14 cm  RIGHT VENTRICLE             IVC RV Basal diam:  2.60 cm     IVC diam: 2.00 cm RV S prime:     10.90 cm/s TAPSE (M-mode): 2.2 cm LEFT ATRIUM             Index        RIGHT ATRIUM           Index LA diam:        3.30 cm 2.12 cm/m   RA Area:     11.60 cm LA Vol (A2C):   52.2  ml 33.54 ml/m  RA Volume:   24.80 ml  15.94 ml/m LA Vol (A4C):   61.5 ml 39.52 ml/m LA Biplane Vol: 56.9 ml 36.56 ml/m  AORTIC VALVE LVOT Vmax:   76.70 cm/s LVOT Vmean:  48.800 cm/s LVOT VTI:    0.184 m  AORTA Ao Root diam: 2.60 cm Ao Asc diam:  2.70 cm MITRAL VALVE               TRICUSPID VALVE MV Area (PHT): 3.60 cm    TR Peak grad:   28.3 mmHg MV Decel Time: 211 msec    TR Vmax:        266.00 cm/s MV E velocity: 97.10 cm/s MV A velocity: 73.90 cm/s  SHUNTS MV E/A ratio:  1.31        Systemic VTI:  0.18 m                            Systemic Diam: 2.00 cm Rudean Haskell MD Electronically signed by Rudean Haskell MD Signature Date/Time: 08/08/2021/5:03:52 PM    Final     Cardiac Studies   Cath: 08/09/21  1.  Mild nonobstructive plaquing in the RCA (dominant vessel), unchanged from previous study 2.  Patent left main with no significant stenosis 3.  Patent LAD with mild proximal vessel stenosis of 40% 4.  Moderate ostial circumflex stenosis estimated at 60 to 70%, unchanged from the previous study.   The patient has stable coronary anatomy from her prior cardiac catheterization study in 2017.  Recommend continued medical therapy.  Okay to resume apixaban 2.5 mg twice daily tomorrow morning.  Diagnostic Dominance: Right  Echo: 08/08/21  IMPRESSIONS     1. Left ventricular ejection fraction, by estimation, is 55 to 60%. The  left ventricle has normal function. The left ventricle has no regional  wall motion abnormalities. Left ventricular diastolic parameters are  consistent with Grade II diastolic  dysfunction (pseudonormalization).   2. Right ventricular systolic function is normal. The right ventricular  size is normal. There is normal pulmonary artery systolic pressure. The  estimated right ventricular systolic pressure is 78.2 mmHg.   3. The mitral valve is grossly normal. Mild mitral valve regurgitation.   4. Tricuspid valve regurgitation is mild to moderate.   5. The  aortic valve is tricuspid. There is mild calcification of the  aortic valve. There is mild thickening of the aortic valve. Aortic valve  regurgitation is not visualized. Aortic valve sclerosis is present, with  no evidence of aortic valve stenosis.   6. The inferior vena cava is normal in size with greater than 50%  respiratory variability, suggesting right atrial pressure of 3 mmHg.   Comparison(s): No significant change from prior study.   FINDINGS   Left Ventricle: Left ventricular ejection fraction, by estimation, is 55  to  60%. The left ventricle has normal function. The left ventricle has no  regional wall motion abnormalities. Global longitudinal strain performed  but not reported based on  interpreter judgement due to suboptimal tracking. The left ventricular  internal cavity size was normal in size. There is no left ventricular  hypertrophy. Left ventricular diastolic parameters are consistent with  Grade II diastolic dysfunction  (pseudonormalization).   Right Ventricle: The right ventricular size is normal. No increase in  right ventricular wall thickness. Right ventricular systolic function is  normal. There is normal pulmonary artery systolic pressure. The tricuspid  regurgitant velocity is 2.66 m/s, and   with an assumed right atrial pressure of 3 mmHg, the estimated right  ventricular systolic pressure is 47.4 mmHg.   Left Atrium: Left atrial size was normal in size.   Right Atrium: Right atrial size was normal in size.   Pericardium: There is no evidence of pericardial effusion.   Mitral Valve: The mitral valve is grossly normal. Mild mitral annular  calcification. Mild mitral valve regurgitation.   Tricuspid Valve: The tricuspid valve is normal in structure. Tricuspid  valve regurgitation is mild to moderate. No evidence of tricuspid  stenosis.   Aortic Valve: The aortic valve is tricuspid. There is mild calcification  of the aortic valve. There is mild  thickening of the aortic valve. There  is mild aortic valve annular calcification. Aortic valve regurgitation is  not visualized. Aortic valve  sclerosis is present, with no evidence of aortic valve stenosis.   Pulmonic Valve: The pulmonic valve was normal in structure. Pulmonic valve  regurgitation is mild. No evidence of pulmonic stenosis.   Aorta: The aortic root and ascending aorta are structurally normal, with  no evidence of dilitation.   Venous: The inferior vena cava is normal in size with greater than 50%  respiratory variability, suggesting right atrial pressure of 3 mmHg.   IAS/Shunts: No atrial level shunt detected by color flow Doppler.   Patient Profile     87 y.o. female  with past medical history of nonobstructive CAD,  hypertension, hyperlipidemia, paroxysmal atrial fibrillation/paroxysmal SVT and pulmonary sarcoidosis who presented for chest pain and worsening dyspnea on exertion evaluation.   Assessment & Plan    Chest pain with mildly elevated troponin: Hs-troponin 23>>33> underwent cardiac cath noted above 6/19 with mild, nonobstructive disease which was stable from cath 2017. No culprit lesion noted. Echocardiogram with normal LV function and grade 2 diastolic dysfunction. -- Continue aspirin, beta-blocker and statin   Paroxysmal atrial fibrillation/PSVT:Maintaining sinus rhythm -- Continue Cardizem CD and beta-blocker -- resume Eliquis   Hypertension: stable -- continue CCB, BB therapy   HLD: 08/08/2021: Cholesterol 188; HDL 67; LDL Cholesterol 105; Triglycerides 81; VLDL 16  -- Continue Crestor 10 mg 3 times per week.  Pulmonary Sarcoidosis: planning for outpatient pulmonary follow up at discharge   Will arrange cardiology follow up  For questions or updates, please contact [place] Please consult [email or web address] for contact info under        Signed, Reino Bellis, NP  08/10/2021, 10:32 AM    I have personally seen and examined this patient.  I agree with the assessment and plan as outlined above.  No chest pan this am.  CAD stable by cath. Will continue medical management of CAD.  Will d/c home today.   Lauree Chandler 08/10/2021 11:01 AM

## 2021-08-10 NOTE — Telephone Encounter (Signed)
AT[name]. Front dest if [facility] calls back please follow Dr. [name] instruction to schedule pt. Thank you

## 2021-08-10 NOTE — Discharge Summary (Signed)
Physician Discharge Summary  [name] KXF:[id number] DOB: Jul 09, 1934 DOA: 08/07/2021  PCP: Sherri Solian, MD  Admit date: 08/07/2021 Discharge date: 08/10/2021  Time spent: 40 minutes  Recommendations for Outpatient Follow-up:  Follow outpatient CBC/CMP  Follow with pulmonology outpatient for progressive dyspnea on exertion over past several months (as well as small L effusion) Follow with cardiology outpatient (consider outpatient zio? Daughter notes "SVT" with event leading to admission per discussion with EMS?)   Discharge Diagnoses:  Principal Problem:   Chest pain, rule out acute myocardial infarction Active Problems:   Shortness of breath   CAD (coronary artery disease)   Leukocytosis   Atrial fibrillation (HCC)   Chronic heart failure with preserved ejection fraction (HCC)   Paroxysmal supraventricular tachycardia (HCC)   Ecchymosis   COPD (chronic obstructive pulmonary disease) (HCC)   GERD (gastroesophageal reflux disease)   SARCOIDOSIS, PULMONARY   Low back pain   Anemia   Cognitive decline   Bladder spasm   HLD (hyperlipidemia)   Essential hypertension   Carotid artery disease (HCC)   Bronchiectasis without complication ([place])   Low compliance bladder   Chest pain   Elevated troponin   Discharge Condition: stable  Diet recommendation: heart healthy  Filed Weights   08/07/21 1531 08/08/21 2037  Weight: 54.4 kg 54.4 kg    History of present illness:   [name] is [name] 87 y.o. female with medical history significant of paroxysmal SVT, CAD, hypertension, hyperlipidemia, carotid artery disease, COPD, sarcoid, bronchiectasis, GERD, low back pain, atrial fibrillation, anemia, CHF, cognitive decline, low compliance bladder presenting with chest pain.  Patient reports 2 days of chest pain.  Initially started feeling unwell yesterday has had increasing shortness of breath and chest heaviness.  Does have some shortness of breath intermittently at  baseline but now much more significant and occurs even with prolonged speaking.  Chest pain is described as [name] heaviness that is intermittent without specific triggers that she has noticed.  Is associated with shortness of breath when it occurs.  States she tried nitro glycerin today and did have relief.  EMS was called and noted at least 30 seconds of tachycardia on the monitor.  Patient is unsure if this is similar to prior cardiac chest pain.  She was admitted for workup for chest pain rule out and shortness of breath.  Now s/p catheterization which showed stable findings.  Daughter expressed concern for her worsening DOE over past 6 months or so, CT scan showed small L effusion.  Patient maintaining sats.  Planning for outpatient pulm follow up at this time.  See below for additional details    Hospital Course:  Assessment and Plan: * Chest pain, rule out acute myocardial infarction Troponin mildly elevated Echo pending EKG without acute findings Cards recommending LHC -> with mild nonobstructing plaquing in RCA, patent L main with no significant stenosis, patent LAD with mild proximal vessel stenosis, moderate ostial circumflex stenosis estimated at 60-70%, stable coronary anatomy from cardiac cath study in 2017 Resume eliquis Low dose beta blocker per cards   Shortness of breath Noted mostly with exertion Daughter requesting pulmonology consult Patient ambulated today about 200 feet, desat to 95% on RA.   CXR 6/17 without acute cardio pulm disease Echo with grade II diastolic dysfunction History of sarcoid and COPD noted CT shows small L effusion, stable right lung scarring.  No acute consolidative process.  I think she can follow with pulmonology outpatient, [name] trying to expedite an outpatient appt.  Return precautions given.     CAD (coronary artery disease) Cath 2017 with nonobstructive CAD Appreciate cardiology recs Planning [name] 6/19 (as above)  Leukocytosis Mild,  follow  Chronic heart failure with preserved ejection fraction ([place]) Echo 09/2019 with EF 65-78%, grade 1 diastolic dysfunction  Atrial fibrillation ([place]) eliquis to resume tomorrow Diltiazem Metop started  Episode of SVT noted per EMS per daughter's report.  Not sure if this related to dyspnea leading to presentation.  Consider outpatient zio?  Discussed with [name].  Ecchymosis Hematoma noted to L wrist last night, now with bruising Will monitor   Paroxysmal supraventricular tachycardia (HCC) Noted hx Continued av nodal blockers  COPD (chronic obstructive pulmonary disease) (HCC) No exacerbation  GERD (gastroesophageal reflux disease) PPI  Low back pain tramadol  SARCOIDOSIS, PULMONARY Noted, follow outpatient  Anemia trend  Cognitive decline donepezil  Bladder spasm Oxybutynin vibegron      Procedures: Echo  IMPRESSIONS     1. Left ventricular ejection fraction, by estimation, is 55 to 60%. The  left ventricle has normal function. The left ventricle has no regional  wall motion abnormalities. Left ventricular diastolic parameters are  consistent with Grade II diastolic  dysfunction (pseudonormalization).   2. Right ventricular systolic function is normal. The right ventricular  size is normal. There is normal pulmonary artery systolic pressure. The  estimated right ventricular systolic pressure is 46.9 mmHg.   3. The mitral valve is grossly normal. Mild mitral valve regurgitation.   4. Tricuspid valve regurgitation is mild to moderate.   5. The aortic valve is tricuspid. There is mild calcification of the  aortic valve. There is mild thickening of the aortic valve. Aortic valve  regurgitation is not visualized. Aortic valve sclerosis is present, with  no evidence of aortic valve stenosis.   6. The inferior vena cava is normal in size with greater than 50%  respiratory variability, suggesting right atrial pressure of 3 mmHg.   Comparison(s): No  significant change from prior study.   cath 1.  Mild nonobstructive plaquing in the RCA (dominant vessel), unchanged from previous study 2.  Patent left main with no significant stenosis 3.  Patent LAD with mild proximal vessel stenosis of 40% 4.  Moderate ostial circumflex stenosis estimated at 60 to 70%, unchanged from the previous study.   The patient has stable coronary anatomy from her prior cardiac catheterization study in 2017.  Recommend continued medical therapy.  Okay to resume apixaban 2.5 mg twice daily tomorrow morning.    Consultations: cards  Discharge Exam: Vitals:   08/10/21 0506 08/10/21 0818  BP: (!) 128/56 (!) 106/54  Pulse:  62  Resp: 18 18  Temp: 97.8 F (36.6 C) 97.9 F (36.6 C)  SpO2:  95%   No complaints Discussed plan of care with patient/daughter  General: No acute distress. Cardiovascular: RRR Lungs: CTAB Abdomen: Soft, nontender, nondistended  Neurological: Alert and oriented 3. Moves all extremities 4 with equal strength. Cranial nerves II through XII grossly intact. Extremities: No clubbing or cyanosis. No edema.   Discharge Instructions   Discharge Instructions     Call MD for:  difficulty breathing, headache or visual disturbances   Complete by: As directed    Call MD for:  extreme fatigue   Complete by: As directed    Call MD for:  hives   Complete by: As directed    Call MD for:  persistant dizziness or light-headedness   Complete by: As directed    Call MD  for:  persistant nausea and vomiting   Complete by: As directed    Call MD for:  redness, tenderness, or signs of infection (pain, swelling, redness, odor or green/yellow discharge around incision site)   Complete by: As directed    Call MD for:  severe uncontrolled pain   Complete by: As directed    Call MD for:  temperature >100.4   Complete by: As directed    Diet - low sodium heart healthy   Complete by: As directed    Discharge instructions   Complete by: As  directed    You were seen for chest pain and shortness of breath.  You had Ric Rosenberg cardiac catheterization which showed stable coronary artery disease.  They recommend continuing your medical therapy for your coronary artery disease.    Your daughter mentioned that EMS mentioned SVT (supraventricular tachycardia).  Your tele here looks good.  Please follow up with cardiology and they'll consider Jeanett Antonopoulos ziopatch or cardiac monitor as an outpatient.  Regarding your shortness of breath, your CT scan showed Gladine Plude small left sided effusion (fluid outside the lung).  I think this can be followed outpatient with pulmonology and [name].  He's set up an appointment for you next Tuesday.    Return for new, recurrent, or worsening symptoms.  Please ask your PCP to request records from this hospitalization so they know what was done and what the next steps will be.   Increase activity slowly   Complete by: As directed       Allergies as of 08/10/2021       Reactions   Levaquin [levofloxacin] Other (See Comments)   Joint pain.. Per doctor not to take again   Sulfonamide Derivatives Other (See Comments)   Possibly caused hepatitis   Oxycodone Nausea Only   Can take oxycodone with Zofran   Sulfa Antibiotics Other (See Comments)   Possibly caused hepatitis   Morphine Nausea Only        Medication List     STOP taking these medications    cefdinir 300 MG capsule Commonly known as: OMNICEF   predniSONE 20 MG tablet Commonly known as: DELTASONE       TAKE these medications    acetaminophen 500 MG tablet Commonly known as: TYLENOL Take 1,000 mg by mouth every 6 (six) hours as needed for mild pain.   albuterol 108 (90 Base) MCG/ACT inhaler Commonly known as: VENTOLIN HFA Inhale 2 puffs into the lungs every 4 (four) hours as needed for wheezing or shortness of breath. What changed: when to take this   aspirin 81 MG chewable tablet Chew 324 mg by mouth once.   budesonide-formoterol 160-4.5  MCG/ACT inhaler Commonly known as: SYMBICORT Inhale 2 puffs into the lungs 2 (two) times daily.   denosumab 60 MG/ML Soln injection Commonly known as: PROLIA Inject 60 mg into the skin every 6 (six) months. Administer in upper arm, thigh, or abdomen   diltiazem 180 MG 24 hr capsule Commonly known as: CARDIZEM CD Take 180 mg by mouth at bedtime.   donepezil 5 MG tablet Commonly known as: ARICEPT TAKE 1 TABLET(5 MG) BY MOUTH AT BEDTIME What changed: See the new instructions.   Eliquis 2.5 MG Tabs tablet Generic drug: apixaban TAKE 1 TABLET(2.5 MG) BY MOUTH TWICE DAILY What changed: See the new instructions.   Gemtesa 75 MG Tabs Generic drug: Vibegron Take 75 mg by mouth at bedtime.   metoprolol tartrate 25 MG tablet Commonly known as: LOPRESSOR Take  0.5 tablets (12.5 mg total) by mouth 2 (two) times daily.   nitroGLYCERIN 0.4 MG SL tablet Commonly known as: NITROSTAT Place 1 tablet (0.4 mg total) under the tongue every 5 (five) minutes as needed for chest pain.   omeprazole 40 MG capsule Commonly known as: PRILOSEC Take 40 mg by mouth 2 (two) times daily.   oxybutynin 5 MG tablet Commonly known as: DITROPAN Take 5 mg by mouth at bedtime.   rosuvastatin 10 MG tablet Commonly known as: CRESTOR Take 1 tablet (10 mg total) by mouth 3 (three) times Jaiden W[place]ab week. Monday, Wednesday and Friday. What changed:  when to take this additional instructions   sertraline 25 MG tablet Commonly known as: ZOLOFT Take 25 mg by mouth at bedtime.   traMADol 50 MG tablet Commonly known as: ULTRAM Take 50 mg by mouth every 8 (eight) hours as needed (pain).   TUMS ULTRA 1000 PO Take 2,000-3,000 mg by mouth at bedtime as needed (acid reflux/indigestion/heartburn).       Allergies  Allergen Reactions   Levaquin [Levofloxacin] Other (See Comments)    Joint pain.. Per doctor not to take again   Sulfonamide Derivatives Other (See Comments)    Possibly caused hepatitis   Oxycodone  Nausea Only    Can take oxycodone with Zofran   Sulfa Antibiotics Other (See Comments)    Possibly caused hepatitis   Morphine Nausea Only      The results of significant diagnostics from this hospitalization (including imaging, microbiology, ancillary and laboratory) are listed below for reference.    Significant Diagnostic Studies: CT CHEST WO CONTRAST  Result Date: 08/10/2021 CLINICAL DATA:  Respiratory illness. EXAM: CT CHEST WITHOUT CONTRAST TECHNIQUE: Multidetector CT imaging of the chest was performed following the standard protocol without IV contrast. RADIATION DOSE REDUCTION: This exam was performed according to the departmental dose-optimization program which includes automated exposure control, adjustment of the mA and/or kV according to patient size and/or use of iterative reconstruction technique. COMPARISON:  November 05, 2019. FINDINGS: Cardiovascular: Atherosclerosis of thoracic aorta is noted without aneurysm formation. Normal cardiac size. No pericardial effusion. Coronary artery calcifications are noted. Mediastinum/Nodes: Thyroid gland is unremarkable. Calcified mediastinal lymph nodes are noted most consistent with prior granulomatous disease. Small sliding-type hiatal hernia is noted. Lungs/Pleura: No pneumothorax is noted. Small left pleural effusion is noted. Stable right apical and basilar scarring is noted. No acute consolidation is noted. Upper Abdomen: No acute abnormality. Musculoskeletal: No chest wall mass or suspicious bone lesions identified. Status post left shoulder arthroplasty. IMPRESSION: Small left pleural effusion is noted. Stable right lung scarring is noted. No acute consolidative process is noted. Small sliding-type hiatal hernia. Calcified mediastinal adenopathy most consistent with prior granulomatous disease. Coronary calcifications are noted. Aortic Atherosclerosis (ICD10-I70.0). Electronically Signed   By: Marijo Conception M.D.   On: 08/10/2021 09:23    CARDIAC CATHETERIZATION  Result Date: 08/09/2021 1.  Mild nonobstructive plaquing in the RCA (dominant vessel), unchanged from previous study 2.  Patent left main with no significant stenosis 3.  Patent LAD with mild proximal vessel stenosis of 40% 4.  Moderate ostial circumflex stenosis estimated at 60 to 70%, unchanged from the previous study. The patient has stable coronary anatomy from her prior cardiac catheterization study in 2017.  Recommend continued medical therapy.  Okay to resume apixaban 2.5 mg twice daily tomorrow morning.   ECHOCARDIOGRAM COMPLETE  Result Date: 08/08/2021    ECHOCARDIOGRAM REPORT   Patient Name:   [name] Date of  Exam: 08/08/2021 Medical Rec #:  [id number]        Height:       63.0 in Accession #:    [id number]       Weight:       120.0 lb Date of Birth:  1934/04/15        BSA:          1.556 m Patient Age:    17 years         BP:           141/60 mmHg Patient Gender: F                HR:           52 bpm. Exam Location:  Inpatient Procedure: 2D Echo Indications:    chest pain  History:        Patient has prior history of Echocardiogram examinations, most                 recent 09/23/2019. CAD, COPD; Risk Factors:Hypertension and                 Dyslipidemia.  Sonographer:    Johny Chess RDCS Referring Phys: [id number] [place]  1. Left ventricular ejection fraction, by estimation, is 55 to 60%. The left ventricle has normal function. The left ventricle has no regional wall motion abnormalities. Left ventricular diastolic parameters are consistent with Grade II diastolic dysfunction (pseudonormalization).  2. Right ventricular systolic function is normal. The right ventricular size is normal. There is normal pulmonary artery systolic pressure. The estimated right ventricular systolic pressure is 09.7 mmHg.  3. The mitral valve is grossly normal. Mild mitral valve regurgitation.  4. Tricuspid valve regurgitation is mild to moderate.  5. The aortic  valve is tricuspid. There is mild calcification of the aortic valve. There is mild thickening of the aortic valve. Aortic valve regurgitation is not visualized. Aortic valve sclerosis is present, with no evidence of aortic valve stenosis.  6. The inferior vena cava is normal in size with greater than 50% respiratory variability, suggesting right atrial pressure of 3 mmHg. Comparison(s): No significant change from prior study. FINDINGS  Left Ventricle: Left ventricular ejection fraction, by estimation, is 55 to 60%. The left ventricle has normal function. The left ventricle has no regional wall motion abnormalities. Global longitudinal strain performed but not reported based on interpreter judgement due to suboptimal tracking. The left ventricular internal cavity size was normal in size. There is no left ventricular hypertrophy. Left ventricular diastolic parameters are consistent with Grade II diastolic dysfunction (pseudonormalization). Right Ventricle: The right ventricular size is normal. No increase in right ventricular wall thickness. Right ventricular systolic function is normal. There is normal pulmonary artery systolic pressure. The tricuspid regurgitant velocity is 2.66 m/s, and  with an assumed right atrial pressure of 3 mmHg, the estimated right ventricular systolic pressure is 35.3 mmHg. Left Atrium: Left atrial size was normal in size. Right Atrium: Right atrial size was normal in size. Pericardium: There is no evidence of pericardial effusion. Mitral Valve: The mitral valve is grossly normal. Mild mitral annular calcification. Mild mitral valve regurgitation. Tricuspid Valve: The tricuspid valve is normal in structure. Tricuspid valve regurgitation is mild to moderate. No evidence of tricuspid stenosis. Aortic Valve: The aortic valve is tricuspid. There is mild calcification of the aortic valve. There is mild thickening of the aortic valve. There is mild aortic valve annular calcification. Aortic valve  regurgitation is not  visualized. Aortic valve sclerosis is present, with no evidence of aortic valve stenosis. Pulmonic Valve: The pulmonic valve was normal in structure. Pulmonic valve regurgitation is mild. No evidence of pulmonic stenosis. Aorta: The aortic root and ascending aorta are structurally normal, with no evidence of dilitation. Venous: The inferior vena cava is normal in size with greater than 50% respiratory variability, suggesting right atrial pressure of 3 mmHg. IAS/Shunts: No atrial level shunt detected by color flow Doppler.  LEFT VENTRICLE PLAX 2D LVIDd:         4.10 cm   Diastology LVIDs:         2.60 cm   LV e' medial:    8.33 cm/s LV PW:         0.70 cm   LV E/e' medial:  11.7 LV IVS:        0.80 cm   LV e' lateral:   8.63 cm/s LVOT diam:     2.00 cm   LV E/e' lateral: 11.3 LV SV:         58 LV SV Index:   37 LVOT Area:     3.14 cm  RIGHT VENTRICLE             IVC RV Basal diam:  2.60 cm     IVC diam: 2.00 cm RV S prime:     10.90 cm/s TAPSE (M-mode): 2.2 cm LEFT ATRIUM             Index        RIGHT ATRIUM           Index LA diam:        3.30 cm 2.12 cm/m   RA Area:     11.60 cm LA Vol (A2C):   52.2 ml 33.54 ml/m  RA Volume:   24.80 ml  15.94 ml/m LA Vol (A4C):   61.5 ml 39.52 ml/m LA Biplane Vol: 56.9 ml 36.56 ml/m  AORTIC VALVE LVOT Vmax:   76.70 cm/s LVOT Vmean:  48.800 cm/s LVOT VTI:    0.184 m  AORTA Ao Root diam: 2.60 cm Ao Asc diam:  2.70 cm MITRAL VALVE               TRICUSPID VALVE MV Area (PHT): 3.60 cm    TR Peak grad:   28.3 mmHg MV Decel Time: 211 msec    TR Vmax:        266.00 cm/s MV E velocity: 97.10 cm/s MV Myla Mauriello velocity: 73.90 cm/s  SHUNTS MV E/Kimberl Vig ratio:  1.31        Systemic VTI:  0.18 m                            Systemic Diam: 2.00 cm Rudean Haskell MD Electronically signed by Rudean Haskell MD Signature Date/Time: 08/08/2021/5:03:52 PM    Final    DG Chest Portable 1 View  Result Date: 08/07/2021 CLINICAL DATA:  Chest pain and shortness of breath since  yesterday. EXAM: PORTABLE CHEST 1 VIEW COMPARISON:  06/16/2020 FINDINGS: Patient is slightly rotated to the left. Lungs are adequately inflated without focal airspace consolidation. Mild stable blunting of the right costophrenic angle likely due in part to the degree of rotation. Cardiomediastinal silhouette and remainder of the exam is unchanged. IMPRESSION: No acute cardiopulmonary disease. Electronically Signed   By: Marin Olp M.D.   On: 08/07/2021 16:28    Microbiology: No results found for this or any  previous visit (from the past 240 hour(s)).   Labs: Basic Metabolic Panel: Recent Labs  Lab 08/07/21 1535 08/08/21 0124 08/09/21 0605 08/10/21 0242  NA 138 137 137 133*  K 3.7 3.9 3.7 3.7  CL 108 106 107 105  CO2 19* 22 23 21*  GLUCOSE 103* 125* 91 84  BUN '18 22 22 18  '$ CREATININE 0.83 0.93 1.05* 0.96  CALCIUM 8.1* 8.6* 8.5* 8.1*  MG  --   --  1.8 1.8  PHOS  --   --  3.7 3.7   Liver Function Tests: Recent Labs  Lab 08/09/21 0605 08/10/21 0242  AST 13* 11*  ALT 13 9  ALKPHOS 62 59  BILITOT 0.2* 0.3  PROT 5.2* 4.7*  ALBUMIN 2.9* 2.6*   No results for input(s): "LIPASE", "AMYLASE" in the last 168 hours. No results for input(s): "AMMONIA" in the last 168 hours. CBC: Recent Labs  Lab 08/07/21 1535 08/08/21 0124 08/09/21 0605 08/10/21 0242  WBC 11.1* 10.6* 11.6* 9.4  NEUTROABS  --   --  7.1 5.9  HGB 11.4* 11.6* 12.0 10.6*  HCT 36.1 35.5* 36.3 34.8*  MCV 94.8 91.3 91.0 96.4  PLT 243 259 251 230   Cardiac Enzymes: No results for input(s): "CKTOTAL", "CKMB", "CKMBINDEX", "TROPONINI" in the last 168 hours. BNP: BNP (last 3 results) Recent Labs    08/07/21 1900  BNP 387.3*    ProBNP (last 3 results) No results for input(s): "PROBNP" in the last 8760 hours.  CBG: Recent Labs  Lab 08/08/21 0539  GLUCAP 79       Signed:  Fayrene Helper MD.  [facility] 08/10/2021, 2:27 PM

## 2021-08-10 NOTE — Telephone Encounter (Signed)
Patient is scheduled 6/27 at 1:30pm with Dr. [name]- nothing further needed.

## 2021-08-10 NOTE — Telephone Encounter (Signed)
Please make an appointment for the pt with APP at the end of this week if possible, and with RB in next available. OK to use one of my 1:30 slots to get her scheduled.

## 2021-08-10 NOTE — Progress Notes (Signed)
SATURATION QUALIFICATIONS: (This note is used to comply with regulatory documentation for home oxygen)  Patient Saturations on Room Air at Rest = 98%  Patient Saturations on Room Air while Ambulating = 95%  Patient Saturations on N/A Liters of oxygen while Ambulating = N/A%  Please briefly explain why patient needs home oxygen: Pt does not require supplemental O2 for home.   [place] Office [phone]

## 2021-08-10 NOTE — TOC Transition Note (Signed)
Transition of Care [facility]) - CM/SW Discharge Note   Patient Details  Name: [name] MRN: [id number] Date of Birth: 07-09-1934  [facility]) CM/SW Contact:  Pollie Friar, RN Phone Number: 08/10/2021, 2:15 PM   Clinical Narrative:    Pt is discharging home with self care. No needs per TOC.   Final next level of care: Home/Self Care Barriers to Discharge: No Barriers Identified   Patient Goals and CMS Choice        Discharge Placement                       Discharge Plan and Services                                     Social Determinants of Health (SDOH) Interventions     Readmission Risk Interventions    09/26/2019   10:56 AM 08/01/2019    4:14 PM  Readmission Risk Prevention Plan  Transportation Screening Complete Complete  PCP or Specialist Appt within 5-7 Days  Complete  PCP or Specialist Appt within 3-5 Days Complete   Home Care Screening  Complete  Medication Review (RN CM)  Referral to Pharmacy  [facility] or [place] Complete   Social Work Consult for [place] Planning/Counseling Complete   Palliative Care Screening Not Applicable   Medication Review [employer]) Complete

## 2021-08-10 NOTE — Progress Notes (Signed)
Pt discharged home with her home brought medicine. Pt educated on radial site care and when to get help right away. All questions answered.

## 2021-08-11 LAB — LIPOPROTEIN A (LPA): Lipoprotein (a): 9.9 nmol/L (ref <75.0)

## 2021-08-12 ENCOUNTER — Telehealth: Payer: Self-pay | Admitting: Neurology

## 2021-08-12 ENCOUNTER — Encounter: Payer: Self-pay | Admitting: Neurology

## 2021-08-12 NOTE — Telephone Encounter (Signed)
This is for neuropsych eval thru Dr. Cecelia Byars ordered back in 05/2021.

## 2021-08-12 NOTE — Telephone Encounter (Signed)
Pt's daughter [name] on [place] called wanting to ask providers advise on her mother's memory doctor appt. Pt has been going through some test and will cont to get more done and the daughter is wanting to know if it would be ok to postpone her mother from seeing the Memory doctor until they get all of these test out of the way or at least get a diagnosis to what the pt is having. Please advise.

## 2021-08-16 ENCOUNTER — Telehealth: Payer: Self-pay | Admitting: Neurology

## 2021-08-16 NOTE — Telephone Encounter (Signed)
medicare NPR sent to GI  

## 2021-08-17 ENCOUNTER — Encounter: Payer: Self-pay | Admitting: Emergency Medicine

## 2021-08-17 ENCOUNTER — Ambulatory Visit (INDEPENDENT_AMBULATORY_CARE_PROVIDER_SITE_OTHER): Payer: Medicare Other | Admitting: Emergency Medicine

## 2021-08-17 DIAGNOSIS — J449 Chronic obstructive pulmonary disease, unspecified: Secondary | ICD-10-CM

## 2021-08-17 DIAGNOSIS — I209 Angina pectoris, unspecified: Secondary | ICD-10-CM | POA: Diagnosis not present

## 2021-08-17 DIAGNOSIS — D869 Sarcoidosis, unspecified: Secondary | ICD-10-CM | POA: Diagnosis not present

## 2021-08-17 DIAGNOSIS — I5032 Chronic diastolic (congestive) heart failure: Secondary | ICD-10-CM | POA: Diagnosis not present

## 2021-08-17 DIAGNOSIS — J479 Bronchiectasis, uncomplicated: Secondary | ICD-10-CM

## 2021-08-17 MED ORDER — BREZTRI AEROSPHERE 160-9-4.8 MCG/ACT IN AERO: 2.0000 | INHALATION_SPRAY | Freq: Two times a day (BID) | RESPIRATORY_TRACT | 0 refills | Status: DC

## 2021-08-17 NOTE — Progress Notes (Signed)
Subjective:    Patient ID: [name], female    DOB: 1934/05/26, 87 y.o.   MRN: [id number]  HPI  ROV 09/22/20 --[name] is 87 with severe obstructive lung disease and bronchiectasis on imaging associated with sarcoidosis.  She also has chronic rhinitis, GERD, A. fib. Currently managed on Symbicort which we restarted in April.  She reports that her breathing is doing well, probably better than last visit. She has trouble tolerating the hot weather. No cough. She has not been needing her albuterol.  She is on cetirizine, fluticasone nasal spray. Her congestion is not active right now.   Chest x-ray 06/16/2020 did not show any significant infiltrates  ROV 08/17/21 --87 year old woman with sarcoidosis and severe COPD, associated bronchiectasis.  Also with chronic rhinitis, atrial fibrillation, GERD.  She was just hospitalized earlier this month for chest discomfort superimposed on chronic progressive dyspnea with a mild troponin elevation.  She underwent left heart catheterization that showed stable significant CAD (unchanged).  He is able to ambulate on room air without desaturation.  She has a history of paroxysmal SVT, unclear whether this was a factor with this admission.  There was no evidence of a COPD exacerbation.  A CT chest was performed as below. She is currently managed on Symbicort, has albuterol which she uses approximately 1-2x a day (was just given levalbuterol) Her functional capacity and exertional SOB have both progressively worsened over about 6 months.   CT chest 08/10/2021 reviewed by me showed calcified mediastinal adenopathy consistent with granulomatous disease, small right apical and basilar scarring without any evidence of pneumonia.  A small left pleural effusion, hiatal hernia.   Review of Systems As per HPI     Objective:   Physical Exam Vitals:   08/17/21 1334  BP: 122/70  Pulse: (!) 57  Temp: 98.4 F (36.9 C)  TempSrc: Oral  SpO2: 97%  Weight: 124  lb (56.2 kg)  Height: '5\' 3"'$  (1.6 m)   Gen: Pleasant, kyphotic elderly woman, in no distress,  normal affect  ENT: No lesions,  mouth clear,  oropharynx clear, no postnasal drip  Neck: No JVD, no stridor  Lungs: No use of accessory muscles, clear without wheeze or crackles.   Cardiovascular: RRR, heart sounds normal, no murmur or gallops, trace ankle peripheral edema  Musculoskeletal: No deformities, no cyanosis or clubbing  Neuro: alert, non focal. Poor memory regarding her med regimen  Skin: Warm, no lesions or rashes       Assessment & Plan:  Chronic heart failure with preserved ejection fraction ([place]) Recent hospitalization with evidence principally for decompensated CHF.  CT chest was stable, small pleural effusion.  No evidence of exacerbation of obstructive lung disease.  Marland Kitchen  COPD (chronic obstructive pulmonary disease) ([place]) We reviewed the notes from your hospitalization and the CT scan that was done during your hospitalization. Temporarily stop Symbicort.  We will try starting Breztri 2 puffs twice a day.  Rinse and gargle after using.  Keep track of whether this medication helps your breathing, increases your functional capacity.  If so we can try to continue it going forward. Keep albuterol available to use 2 puffs to be needed for shortness of breath, chest tightness, wheezing. Agree with getting started with your physical therapy if possible. Follow with APP in 1 month. Follow Dr. [name] in 6 months or sooner if you have any problems.  Bronchiectasis without complication ([place]) CT stable.  Continue efforts at controlling rhinitis, mucus production, mucociliary clearance  SARCOIDOSIS, PULMONARY CT 08/10/2021 reviewed, stable with granulomatous disease, apical scar, basilar scar, calcified mediastinal adenopathy.  No new findings.   Baltazar Apo, MD, PhD 09/15/2021, 1:51 PM [facility] [phone] or if no answer [phone]

## 2021-08-20 NOTE — Telephone Encounter (Signed)
Interview: 11/09/2021 Testing: 11/09/2021 Feedback: 11/25/2021

## 2021-08-22 NOTE — Progress Notes (Signed)
Cardiology Office Note:    Date:  08/23/2021   ID:  [name], DOB 13-Sep-1934, MRN [id number]  PCP:  [name], [place] Prov[facility] Cardiologist:  Sherren Mocha, MD Cardiology APP:  Sharmon Revere    Referring MD: [name], MD   Chief Complaint:  Hospitalization Follow-up (Chest pain)    Patient Profile: Coronary artery disease Cath 3/17: oLCx 60-70 (neg by FFR) Myoview 11/16: no ischemia Myoview 07/2018: EF 73, prob low risk (significant extracardiac uptake), no ischemia Myoview 6/22: low risk  Cath 07/2021: LCx 60-70 (no ? from last cath), LAD 40, RCA 30 - Med Rx Paroxysmal atrial fibrillation  CHA2DS2-VASc=5 (CAD, HTN, age x 2, female) >> Apixaban 2.5 mg (> 55 yo, wt <60 kg)  (HFpEF) heart failure with preserved ejection fraction  Echo 8/21: EF 55-60, GR 1 DD, RVSP 24.2, trivial MR Echocardiogram 6/23: EF 55-60, Gr 2 DD Aortic atherosclerosis  Pulmonary sarcoidosis Echo 8/21: RVSP 24.2 Echo 5/21: RVSP 36.9 Bronchiectasis Hypertension Hyperlipidemia SVT Carotid stenosis [place] 06/2018: bilat 1-39 Dementia   Prior CV Studies: Cardiac catheterization August 23, 2021 1.  Mild nonobstructive plaquing in the RCA (dominant vessel), unchanged from previous study 2.  Patent left main with no significant stenosis 3.  Patent LAD with mild proximal vessel stenosis of 40% 4.  Moderate ostial circumflex stenosis estimated at 60 to 70%, unchanged from the previous study.   Echocardiogram 08/08/21 EF 55-60, no RWMA, Gr 2 DD, normal RVSF, normal PASP (RVSP 31.3), mild MR, mild to mod TR, AV sclerosis w/o AS  GATED SPECT MYO PERF W/LEXISCAN STRESS 1D 08/05/2020 EF 72, normal perfusion, low risk   Echocardiogram 09/23/19 EF 55-60, GR 1 DD, RVSP 24.2, trivial MR   Carotid US 07/09/2018 Bilat ICA 1-39   History of Present Illness:   [name] is a 87 y.o. female with the above problem list.  She was last seen in clinic by Dr. Burt Knack in Jan  2023.  She was admitted 6/17-6/23 with chest pain and mildly elevated troponin levels (23>>33).  Echocardiogram showed normal EF.  Cardiac catheterization demonstrated stable anatomy without change from cath in 2017.  Med Rx was recommended.  Review of DC notes indicates she noted progressive dyspnea and OP pulmonology evaluation was arranged. There was also reported "Supraventricular Tachycardia" by EMS.  Recommendation was to consider OP Zio Monitor.    She returns for f/u.  She is here with her daughter.  Since DC, she has done well.  She say Dr. [name] last week and her meds were adjusted.  She feels her breathing is better.  She has not had any further palpitations.  Her BP runs a little low and her daughter has not given her the metoprolol.  She has not had further chest pain.  She has not had syncope, orthopnea, leg edema.  She is not currently on Lasix.      Past Medical History:  Diagnosis Date   Acute maxillary sinusitis 10/17/2017   Acute renal failure syndrome ([place]) 01/04/2019   Allergy    SEASONAL   Anemia    Arthritis    Asthma    "slight"    Baker's cyst of knee, right    CAD (coronary artery disease)    a. Myoview 10/15 - normal EF 70% // Myoview 11/16: EF 75%, normal perfusion, low risk // c. LHC 3/17 - LAD irregs, oLCx 70 (neg FFR), mRCA 30, EF 55-65% >> med Rx // Myoview 07/2018:  EF  73, extracardiac uptake, no significant ischemia (reviewed with Dr. Burt Knack), Low Risk // Myoview 6/22: EF 72, normal perfusion, low risk   Carotid stenosis    a. Carotid US 9/15 - Bilateral 1-39% ICA >> FU 2 years // b. Bilateral ICA 1-39 >> FU prn // Carotid US 06/2018: bilat 1-39; fu prn   Dyspnea    due to Sarcoidosis. When is windy or humed   Dysrhythmia    fast heart rate   Elbow fracture 04/2017   Right, had surgery   Esophageal reflux    Fracture of inferior pubic ramus ([place]) 02/28/2019   Hematochezia    Hip fracture ([place]) 12/12/2018   History of echocardiogram    a. Echo 10/15 - EF  60-65%, no RWMA   HLD (hyperlipidemia)    Hx of colonoscopy    Macular pucker, right eye    will have removed on 01/26/21   Osteoporosis    Other chronic pulmonary heart diseases    Paroxysmal supraventricular tachycardia ([place])    Pneumonia    Pneumonia 09/18/2019   Pre-op evaluation 06/11/2019   Pyelonephritis 07/29/2019   Sacral insufficiency fracture 12/12/2018   Sarcoidosis    Sepsis ([place]) 07/30/2019   Sepsis due to Escherichia coli (e. coli) ([place]) 07/29/2019   Sepsis due to urinary tract infection ([place]) 07/18/2019   Current Medications: Current Meds  Medication Sig   acetaminophen (TYLENOL) 500 MG tablet Take 1,000 mg by mouth every 6 (six) hours as needed for mild pain.    albuterol (VENTOLIN HFA) 108 (90 Base) MCG/ACT inhaler Inhale 2 puffs into the lungs every 4 (four) hours as needed for wheezing or shortness of breath.   apixaban (ELIQUIS) 2.5 MG TABS tablet TAKE 1 TABLET(2.5 MG) BY MOUTH TWICE DAILY   aspirin 81 MG chewable tablet Chew 81 mg by mouth daily.   Budeson-Glycopyrrol-Formoterol (BREZTRI AEROSPHERE) 160-9-4.8 MCG/ACT AERO Inhale 2 puffs into the lungs in the morning and at bedtime.   Calcium Carbonate Antacid (TUMS ULTRA 1000 PO) Take 2,000-3,000 mg by mouth at bedtime as needed (acid reflux/indigestion/heartburn).   denosumab (PROLIA) 60 MG/ML SOLN injection Inject 60 mg into the skin every 6 (six) months. Administer in upper arm, thigh, or abdomen   diltiazem (CARDIZEM CD) 120 MG 24 hr capsule Take 1 capsule (120 mg total) by mouth daily.   donepezil (ARICEPT) 5 MG tablet TAKE 1 TABLET(5 MG) BY MOUTH AT BEDTIME   metoprolol tartrate (LOPRESSOR) 25 MG tablet Take 1 tablet (25 mg total) by mouth daily as needed (palpitations).   nitroGLYCERIN (NITROSTAT) 0.4 MG SL tablet Place 1 tablet (0.4 mg total) under the tongue every 5 (five) minutes as needed for chest pain.   omeprazole (PRILOSEC) 40 MG capsule Take 40 mg by mouth 2 (two) times daily.    oxybutynin (DITROPAN) 5 MG  tablet Take 5 mg by mouth at bedtime.   sertraline (ZOLOFT) 25 MG tablet Take 25 mg by mouth at bedtime.   traMADol (ULTRAM) 50 MG tablet Take 50 mg by mouth every 8 (eight) hours as needed (pain).   Vibegron (GEMTESA) 75 MG TABS Take 75 mg by mouth at bedtime.   [DISCONTINUED] diltiazem (CARDIZEM CD) 180 MG 24 hr capsule Take 180 mg by mouth at bedtime.    [DISCONTINUED] metoprolol tartrate (LOPRESSOR) 25 MG tablet Take 0.5 tablets (12.5 mg total) by mouth 2 (two) times daily.   [DISCONTINUED] rosuvastatin (CRESTOR) 10 MG tablet Take 1 tablet (10 mg total) by mouth 3 (three) times a week.  Monday, Wednesday and Friday.    Allergies:   Levaquin [levofloxacin], Oxycodone, Sulfonamide derivatives, Sulfa antibiotics, and Morphine   Social History   Tobacco Use   Smoking status: Never   Smokeless tobacco: Never  Vaping Use   Vaping Use: Never used  Substance Use Topics   Alcohol use: No   Drug use: No    Family Hx: The patient's family history includes Arrhythmia in her father; Cancer in her father, mother, and sister; Colon cancer (age of onset: 80) in her mother; Heart attack in her brother; Heart disease in her father; Lung cancer in her father. There is no history of Esophageal cancer, Rectal cancer, Stomach cancer, or Stroke.  Review of Systems  Gastrointestinal:  Negative for hematochezia.  Genitourinary:  Negative for hematuria.     EKGs/Labs/Other Test Reviewed:    EKG:  EKG is not ordered today.  The ekg ordered today demonstrates n/a  Recent Labs: 08/07/2021: B Natriuretic Peptide 387.3 08/10/2021: ALT 9; BUN 18; Creatinine, Ser 0.96; Hemoglobin 10.6; Magnesium 1.8; Platelets 230; Potassium 3.7; Sodium 133   Recent Lipid Panel Recent Labs    08/08/21 0124  CHOL 188  TRIG 81  HDL 67  VLDL 16  LDLCALC 105*     Risk Assessment/Calculations/Metrics:    CHA2DS2-VASc Score = 6   This indicates a 9.7% annual risk of stroke. The patient's score is based upon: CHF  History: 1 HTN History: 1 Diabetes History: 0 Stroke History: 0 Vascular Disease History: 1 Age Score: 2 Gender Score: 1             Physical Exam:    VS:  BP (!) 124/42   Pulse (!) 59   Ht '5\' 3"'$  (1.6 m)   Wt 123 lb 12.8 oz (56.2 kg)   LMP 02/22/1983 (Approximate)   SpO2 98%   BMI 21.93 kg/m     Wt Readings from Last 3 Encounters:  08/23/21 123 lb 12.8 oz (56.2 kg)  08/17/21 124 lb (56.2 kg)  08/08/21 119 lb 15.9 oz (54.4 kg)    Constitutional:      Appearance: Healthy appearance. Not in distress.  Neck:     Vascular: JVD normal.  Pulmonary:     Effort: Pulmonary effort is normal.     Breath sounds: No wheezing. No rales.  Cardiovascular:     Normal rate. Regular rhythm. Normal S1. Normal S2.      Murmurs: There is no murmur.  Edema:    Peripheral edema absent.  Abdominal:     Palpations: Abdomen is soft.  Skin:    General: Skin is warm and dry.  Neurological:     Mental Status: Alert and oriented to person, place and time.         ASSESSMENT & PLAN:   CAD (coronary artery disease) She has a hx of mild to mod non-obstructive CAD.  Myoview in 2022 was low risk.  She was recently admitted with chest pain and mildly elevated hs-Troponins. The pattern did not really appear c/w ACS. Cardiac catheterization did demonstrate fairly stable anatomy with LCx 60-70 unchanged from prior cath and mild non-obstructive disease in the LAD and RCA.  Med Rx is continued.  She is not currently having angina.  Continue medical management of CAD with ASA 81 mg once daily, Rosuvastatin 10 mg 3 x a week.  F/u 6 mos.    Chronic heart failure with preserved ejection fraction (HCC) EF preserved on Echocardiogram done in the hospital.  Her BP  is running somewhat low.  I do not think she will tolerate Spironolactone or even SGLT2i at this time.  She is not even taking Lasix.  I would hold off on adjusting her medical regimen at this time.  If she has more issue with volume overload, we can  consider SGLT2i.  Atrial fibrillation (HCC) Currently in NSR.  She is tolerating anticoagulation well.  By her age and weight, continue Apixaban 2.5 mg twice daily.    Essential hypertension Her diastolic BP is running somewhat low and she feels lightheaded.  I will reduce her Diltiazem to 120 mg once daily.  HLD (hyperlipidemia) Her daughter asked if any meds can cause memory loss.  I recommend she hold Crestor for 2-3 mos.  If her memory improves, she should call us.  If not, she can resume it.    Paroxysmal supraventricular tachycardia ([place]) This was noted by EMS when she was brought the hospital.  I cannot find any documentation in the chart.  She was started on metoprolol in the hospital but her diastolic BP has been low. She has not been taking it.  She is not having symptoms.  I recommend holding off on a monitor for now.  If she has recurrent palpitations, we can set her up with a 14 day Zio monitor.  She is agreeable to this.  She can change the Metoprolol tartrate to 25 mg once daily as needed for rapid palpitations.    SARCOIDOSIS, PULMONARY F/u with pulmonology as planned.            Dispo:  Return in about 6 months (around 02/23/2022) for Routine Follow Up, w/ Dr. Burt Knack, or [name], PA-C.   Medication Adjustments/Labs and Tests Ordered: Current medicines are reviewed at length with the patient today.  Concerns regarding medicines are outlined above.  Tests Ordered: No orders of the defined types were placed in this encounter.  Medication Changes: Meds ordered this encounter  Medications   diltiazem (CARDIZEM CD) 120 MG 24 hr capsule    Sig: Take 1 capsule (120 mg total) by mouth daily.    Dispense:  90 capsule    Refill:  3   metoprolol tartrate (LOPRESSOR) 25 MG tablet    Sig: Take 1 tablet (25 mg total) by mouth daily as needed (palpitations).    Dispense:  90 tablet    Refill:  3   Signed, [name], PA-C  08/23/2021 12:31 PM    [address], [place], [place]  [address] Phone: [phone]; Fax: [phone]

## 2021-08-23 ENCOUNTER — Encounter: Payer: Self-pay | Admitting: Physician Assistant

## 2021-08-23 ENCOUNTER — Ambulatory Visit (INDEPENDENT_AMBULATORY_CARE_PROVIDER_SITE_OTHER): Payer: Medicare Other | Admitting: Physician Assistant

## 2021-08-23 VITALS — BP 124/42 | HR 59 | Ht 63.0 in | Wt 123.8 lb

## 2021-08-23 DIAGNOSIS — I480 Paroxysmal atrial fibrillation: Secondary | ICD-10-CM

## 2021-08-23 DIAGNOSIS — D869 Sarcoidosis, unspecified: Secondary | ICD-10-CM

## 2021-08-23 DIAGNOSIS — I209 Angina pectoris, unspecified: Secondary | ICD-10-CM | POA: Diagnosis not present

## 2021-08-23 DIAGNOSIS — I5032 Chronic diastolic (congestive) heart failure: Secondary | ICD-10-CM | POA: Diagnosis not present

## 2021-08-23 DIAGNOSIS — E782 Mixed hyperlipidemia: Secondary | ICD-10-CM

## 2021-08-23 DIAGNOSIS — I2510 Atherosclerotic heart disease of native coronary artery without angina pectoris: Secondary | ICD-10-CM

## 2021-08-23 DIAGNOSIS — I471 Supraventricular tachycardia: Secondary | ICD-10-CM

## 2021-08-23 DIAGNOSIS — I10 Essential (primary) hypertension: Secondary | ICD-10-CM

## 2021-08-23 MED ORDER — DILTIAZEM HCL ER COATED BEADS 120 MG PO CP24: 120.0000 mg | ORAL_CAPSULE | Freq: Every day | ORAL | 3 refills | Status: DC

## 2021-08-23 MED ORDER — METOPROLOL TARTRATE 25 MG PO TABS: 25.0000 mg | ORAL_TABLET | Freq: Every day | ORAL | 3 refills | Status: DC | PRN

## 2021-08-23 NOTE — Assessment & Plan Note (Signed)
Currently in NSR.  She is tolerating anticoagulation well.  By her age and weight, continue Apixaban 2.5 mg twice daily.

## 2021-08-23 NOTE — Assessment & Plan Note (Signed)
Her diastolic BP is running somewhat low and she feels lightheaded.  I will reduce her Diltiazem to 120 mg once daily.

## 2021-08-23 NOTE — Assessment & Plan Note (Signed)
She has a hx of mild to mod non-obstructive CAD.  Myoview in 2022 was low risk.  She was recently admitted with chest pain and mildly elevated hs-Troponins. The pattern did not really appear c/w ACS. Cardiac catheterization did demonstrate fairly stable anatomy with LCx 60-70 unchanged from prior cath and mild non-obstructive disease in the LAD and RCA.  Med Rx is continued.  She is not currently having angina.  Continue medical management of CAD with ASA 81 mg once daily, Rosuvastatin 10 mg 3 x a week.  F/u 6 mos.

## 2021-08-23 NOTE — Assessment & Plan Note (Signed)
EF preserved on Echocardiogram done in the hospital.  Her BP is running somewhat low.  I do not think she will tolerate Spironolactone or even SGLT2i at this time.  She is not even taking Lasix.  I would hold off on adjusting her medical regimen at this time.  If she has more issue with volume overload, we can consider SGLT2i.

## 2021-08-23 NOTE — Patient Instructions (Signed)
Medication Instructions:  Your physician has recommended you make the following change in your medication:   REDUCE the Diltiazem to 120 mg daily CHANGE the Metoprolol to 1 whole tablet only as needed for palpitations.  If this happens, please call and let us know  HOLD the Crestor (Rosuvastatin)  2-3 months.  If your memory improves, call and let us know, if it doesn't change, please resume and let us know  *If you need a refill on your cardiac medications before your next appointment, please call your pharmacy*   Lab Work: None ordered  If you have labs (blood work) drawn today and your tests are completely normal, you will receive your results only by: Erma (if you have MyChart) OR A paper copy in the mail If you have any lab test that is abnormal or we need to change your treatment, we will call you to review the results.   Testing/Procedures: None ordered   Follow-Up: At [facility], you and your health needs are our priority.  As part of our continuing mission to provide you with exceptional heart care, we have created designated Provider Care Teams.  These Care Teams include your primary Cardiologist (physician) and Advanced Practice Providers (APPs -  Physician Assistants and Nurse Practitioners) who all work together to provide you with the care you need, when you need it.  We recommend signing up for the patient portal called "MyChart".  Sign up information is provided on this After Visit Summary.  MyChart is used to connect with patients for Virtual Visits (Telemedicine).  Patients are able to view lab/test results, encounter notes, upcoming appointments, etc.  Non-urgent messages can be sent to your provider as well.   To learn more about what you can do with MyChart, go to [email or web address].    Your next appointment:   6 month(s)  The format for your next appointment:   In Person  Provider:   Sherren Mocha, MD  or [name], PA-C          Other Instructions   Important Information About Sugar

## 2021-08-23 NOTE — Assessment & Plan Note (Signed)
Her daughter asked if any meds can cause memory loss.  I recommend she hold Crestor for 2-3 mos.  If her memory improves, she should call us.  If not, she can resume it.

## 2021-08-23 NOTE — Assessment & Plan Note (Signed)
This was noted by EMS when she was brought the hospital.  I cannot find any documentation in the chart.  She was started on metoprolol in the hospital but her diastolic BP has been low. She has not been taking it.  She is not having symptoms.  I recommend holding off on a monitor for now.  If she has recurrent palpitations, we can set her up with a 14 day Zio monitor.  She is agreeable to this.  She can change the Metoprolol tartrate to 25 mg once daily as needed for rapid palpitations.

## 2021-08-23 NOTE — Assessment & Plan Note (Deleted)
This was noted by EMS when she was brought the hospital.  I cannot find any documentation in the chart.  She was started on metoprolol in the hospital but her diastolic BP has been low. She has not been taking it.  She is not having symptoms.  I recommend holding off on a monitor for now.  If she has recurrent palpitations, we can set her up with a 14 day Zio monitor.  She is agreeable to this.  She can change the Metoprolol tartrate to 25 mg once daily as needed for rapid palpitations.

## 2021-08-23 NOTE — Assessment & Plan Note (Signed)
F/u with pulmonology as planned. 

## 2021-09-03 ENCOUNTER — Encounter: Payer: Self-pay | Admitting: Neurology

## 2021-09-15 NOTE — Assessment & Plan Note (Signed)
We reviewed the notes from your hospitalization and the CT scan that was done during your hospitalization. Temporarily stop Symbicort.  We will try starting Breztri 2 puffs twice a day.  Rinse and gargle after using.  Keep track of whether this medication helps your breathing, increases your functional capacity.  If so we can try to continue it going forward. Keep albuterol available to use 2 puffs to be needed for shortness of breath, chest tightness, wheezing. Agree with getting started with your physical therapy if possible. Follow with APP in 1 month. Follow Dr. [name] in 6 months or sooner if you have any problems.

## 2021-09-15 NOTE — Assessment & Plan Note (Signed)
Recent hospitalization with evidence principally for decompensated CHF.  CT chest was stable, small pleural effusion.  No evidence of exacerbation of obstructive lung disease.  Marland Kitchen

## 2021-09-15 NOTE — Assessment & Plan Note (Signed)
CT 08/10/2021 reviewed, stable with granulomatous disease, apical scar, basilar scar, calcified mediastinal adenopathy.  No new findings.

## 2021-09-15 NOTE — Assessment & Plan Note (Signed)
CT stable.  Continue efforts at controlling rhinitis, mucus production, mucociliary clearance

## 2021-09-16 ENCOUNTER — Ambulatory Visit: Payer: Medicare Other | Admitting: Nurse Practitioner

## 2021-09-16 NOTE — Progress Notes (Deleted)
$'@Patient'M$  ID: [name], female    DOB: [birth year], 87 y.o.   MRN: [id number]  No chief complaint on file.   Referring provider: [name], MD  HPI: 87 year old female, never smoker followed for severe obstructive lung disease with bronchiectasis and sarcoidosis.  She is a patient Dr. Agustina Mcgrath and last seen in office 08/17/2021.  Past medical history significant for chronic rhinitis, GERD, A-fib on chronic anticoagulation, CHF, mitral regurgitation, hypertension, CAD, IDA, HLD.  TEST/EVENTS:  11/27/2013 PFT: FVC 73, FEV1 52, ratio 54, TLC 86 08/08/2021 echocardiogram: EF 55 to 60%.  G2 DD.  RV size and function is normal.  Normal PASP.  Mild MR.  TR is mild to moderate. 08/09/2021 cardiac cath: Mild nonobstructive plaquing in the RCA.  Patent left main with no significant stenosis.  Patent LDA with mild stenosis of 40%.  Moderate ostial circumflex stenosis estimated at 60 to 70%, unchanged. 08/10/2021 CT chest without contrast: Atherosclerosis and CAD.  Calcified mediastinal lymph nodes are noted most consistent with prior granulomatous disease and stable.  Small sliding-type hiatal hernia.  There is a small left pleural effusion.  Stable right apical and basilar scarring is noted.  Otherwise lungs are clear.  08/17/2021: OV with [name].  She was tried on Symbicort in April.  Hospitalized earlier this month for chest discomfort superimposed on chronic progressive dyspnea with a mild troponin elevation.  She underwent left heart catheterization that showed stable significant CAD.  She is able to ambulate on room air without desaturation.  She does have a history of paroxysmal SVT, unclear whether this was a factor with this admission.  No evidence of COPD exacerbation.  Currently managed on Symbicort and uses albuterol 1-2 times a day.  Functional capacity and exertional shortness of breath have both progressively worsened over the last 6 months.  CT chest from her hospitalization showed  calcified mediastinal adenopathy consistent with granulomatous disease, small right apical and basilar scarring without any evidence of pneumonia.  There is also a small left pleural effusion.  Step up to [facility] from Symbicort.  Continue mucociliary clearance therapies.  Follow-up with app in 1 month    Allergies  Allergen Reactions   Levaquin [Levofloxacin] Other (See Comments)    Joint pain.. Per doctor not to take again   Oxycodone Nausea Only    Can take oxycodone with Zofran Other reaction(s): vomiting   Sulfonamide Derivatives Other (See Comments)    Possibly caused hepatitis   Sulfa Antibiotics Other (See Comments)    Possibly caused hepatitis   Morphine Nausea Only    Immunization History  Administered Date(s) Administered   Fluad Quad(high Dose 65+) 11/19/2019   Influenza Inj Mdck Quad Pf 11/05/2018   Influenza Split 10/28/2008, 11/23/2009, 11/17/2010, 11/29/2011, 10/18/2012, 11/18/2013   Influenza, High Dose Seasonal PF 11/02/2016, 11/04/2017   Influenza, Quadrivalent, Recombinant, Inj, Pf 11/04/2017, 11/29/2018   Influenza,inj,Quad PF,6+ Mos 11/18/2013, 11/12/2014   Influenza,inj,quad, With Preservative 11/26/2018, 12/24/2019   Influenza-Unspecified 11/05/2013, 11/06/2014, 12/07/2015   PFIZER(Purple Top)SARS-COV-2 Vaccination 03/14/2019, 04/04/2019, 12/12/2019   Pneumococcal Conjugate-13 02/25/2014   Pneumococcal Polysaccharide-23 05/03/2001   Pneumococcal-Unspecified 05/03/2001, 02/03/2016   Td 10/18/2011   Tdap 01/02/2012, 01/14/2012   Zoster, Live 10/18/2012, 11/08/2013    Past Medical History:  Diagnosis Date   Acute maxillary sinusitis 10/17/2017   Acute renal failure syndrome ([place]) 01/04/2019   Allergy    SEASONAL   Anemia    Arthritis    Asthma    "slight"    Baker's cyst  of knee, right    CAD (coronary artery disease)    a. Myoview 10/15 - normal EF 70% // Myoview 11/16: EF 75%, normal perfusion, low risk // c. LHC 3/17 - LAD irregs, oLCx 70 (neg  FFR), mRCA 30, EF 55-65% >> med Rx // Myoview 07/2018:  EF 73, extracardiac uptake, no significant ischemia (reviewed with Dr. Burt Knack), Low Risk // Myoview 6/22: EF 72, normal perfusion, low risk   Carotid stenosis    a. Carotid US 9/15 - Bilateral 1-39% ICA >> FU 2 years // b. Bilateral ICA 1-39 >> FU prn // Carotid US 06/2018: bilat 1-39; fu prn   Dyspnea    due to Sarcoidosis. When is windy or humed   Dysrhythmia    fast heart rate   Elbow fracture 04/2017   Right, had surgery   Esophageal reflux    Fracture of inferior pubic ramus ([place]) 02/28/2019   Hematochezia    Hip fracture ([place]) 12/12/2018   History of echocardiogram    a. Echo 10/15 - EF 60-65%, no RWMA   HLD (hyperlipidemia)    Hx of colonoscopy    Macular pucker, right eye    will have removed on 01/26/21   Osteoporosis    Other chronic pulmonary heart diseases    Paroxysmal supraventricular tachycardia ([place])    Pneumonia    Pneumonia 09/18/2019   Pre-op evaluation 06/11/2019   Pyelonephritis 07/29/2019   Sacral insufficiency fracture 12/12/2018   Sarcoidosis    Sepsis ([place]) 07/30/2019   Sepsis due to Escherichia coli (e. coli) ([place]) 07/29/2019   Sepsis due to urinary tract infection ([place]) 07/18/2019    Tobacco History: Social History   Tobacco Use  Smoking Status Never  Smokeless Tobacco Never   Counseling given: Not Answered   Outpatient Medications Prior to Visit  Medication Sig Dispense Refill   acetaminophen (TYLENOL) 500 MG tablet Take 1,000 mg by mouth every 6 (six) hours as needed for mild pain.      albuterol (VENTOLIN HFA) 108 (90 Base) MCG/ACT inhaler Inhale 2 puffs into the lungs every 4 (four) hours as needed for wheezing or shortness of breath. 6.7 g 1   apixaban (ELIQUIS) 2.5 MG TABS tablet TAKE 1 TABLET(2.5 MG) BY MOUTH TWICE DAILY 180 tablet 1   aspirin 81 MG chewable tablet Chew 81 mg by mouth daily.     Budeson-Glycopyrrol-Formoterol (BREZTRI AEROSPHERE) 160-9-4.8 MCG/ACT AERO Inhale 2 puffs into the  lungs in the morning and at bedtime. 5.9 g 0   Calcium Carbonate Antacid (TUMS ULTRA 1000 PO) Take 2,000-3,000 mg by mouth at bedtime as needed (acid reflux/indigestion/heartburn).     denosumab (PROLIA) 60 MG/ML SOLN injection Inject 60 mg into the skin every 6 (six) months. Administer in upper arm, thigh, or abdomen     diltiazem (CARDIZEM CD) 120 MG 24 hr capsule Take 1 capsule (120 mg total) by mouth daily. 90 capsule 3   donepezil (ARICEPT) 5 MG tablet TAKE 1 TABLET(5 MG) BY MOUTH AT BEDTIME 90 tablet 1   metoprolol tartrate (LOPRESSOR) 25 MG tablet Take 1 tablet (25 mg total) by mouth daily as needed (palpitations). 90 tablet 3   nitroGLYCERIN (NITROSTAT) 0.4 MG SL tablet Place 1 tablet (0.4 mg total) under the tongue every 5 (five) minutes as needed for chest pain. 25 tablet 3   omeprazole (PRILOSEC) 40 MG capsule Take 40 mg by mouth 2 (two) times daily.      oxybutynin (DITROPAN) 5 MG tablet Take 5 mg by  mouth at bedtime.     sertraline (ZOLOFT) 25 MG tablet Take 25 mg by mouth at bedtime.     traMADol (ULTRAM) 50 MG tablet Take 50 mg by mouth every 8 (eight) hours as needed (pain).     Vibegron (GEMTESA) 75 MG TABS Take 75 mg by mouth at bedtime.     No facility-administered medications prior to visit.     Review of Systems:   Constitutional: No weight loss or gain, night sweats, fevers, chills, fatigue, or lassitude. HEENT: No headaches, difficulty swallowing, tooth/dental problems, or sore throat. No sneezing, itching, ear ache, nasal congestion, or post nasal drip CV:  No chest pain, orthopnea, PND, swelling in lower extremities, anasarca, dizziness, palpitations, syncope Resp: No shortness of breath with exertion or at rest. No excess mucus or change in color of mucus. No productive or non-productive. No hemoptysis. No wheezing.  No chest wall deformity GI:  No heartburn, indigestion, abdominal pain, nausea, vomiting, diarrhea, change in bowel habits, loss of appetite, bloody  stools.  GU: No dysuria, change in color of urine, urgency or frequency.  No flank pain, no hematuria  Skin: No rash, lesions, ulcerations MSK:  No joint pain or swelling.  No decreased range of motion.  No back pain. Neuro: No dizziness or lightheadedness.  Psych: No depression or anxiety. Mood stable.     Physical Exam:  LMP 02/22/1983 (Approximate)   GEN: Pleasant, interactive, well-nourished/chronically-ill appearing/acutely-ill appearing/poorly-nourished/morbidly obese; in no acute distress.****** HEENT:  Normocephalic and atraumatic. EACs patent bilaterally. TM pearly gray with present light reflex bilaterally. PERRLA. Sclera white. Nasal turbinates pink, moist and patent bilaterally. No rhinorrhea present. Oropharynx pink and moist, without exudate or edema. No lesions, ulcerations, or postnasal drip.  NECK:  Supple w/ fair ROM. No JVD present. Normal carotid impulses w/o bruits. Thyroid symmetrical with no goiter or nodules palpated. No lymphadenopathy.   CV: RRR, no m/r/g, no peripheral edema. Pulses intact, +2 bilaterally. No cyanosis, pallor or clubbing. PULMONARY:  Unlabored, regular breathing. Clear bilaterally A&P w/o wheezes/rales/rhonchi. No accessory muscle use. No dullness to percussion. GI: BS present and normoactive. Soft, non-tender to palpation. No organomegaly or masses detected. No CVA tenderness. MSK: No erythema, warmth or tenderness. Cap refil <2 sec all extrem. No deformities or joint swelling noted.  Neuro: A/Ox3. No focal deficits noted.   Skin: Warm, no lesions or rashe Psych: Normal affect and behavior. Judgement and thought content appropriate.     Lab Results:  CBC    Component Value Date/Time   WBC 9.4 08/10/2021 0242   RBC 3.61 (L) 08/10/2021 0242   HGB 10.6 (L) 08/10/2021 0242   HGB 12.7 07/21/2020 1315   HCT 34.8 (L) 08/10/2021 0242   HCT 38.4 07/21/2020 1315   PLT 230 08/10/2021 0242   PLT 309 07/21/2020 1315   MCV 96.4 08/10/2021 0242    MCV 94 07/21/2020 1315   MCH 29.4 08/10/2021 0242   MCHC 30.5 08/10/2021 0242   RDW 14.6 08/10/2021 0242   RDW 12.7 07/21/2020 1315   LYMPHSABS 1.7 08/10/2021 0242   MONOABS 0.8 08/10/2021 0242   EOSABS 0.2 08/10/2021 0242   BASOSABS 0.0 08/10/2021 0242    BMET    Component Value Date/Time   NA 133 (L) 08/10/2021 0242   NA 140 08/26/2020 0916   K 3.7 08/10/2021 0242   CL 105 08/10/2021 0242   CO2 21 (L) 08/10/2021 0242   GLUCOSE 84 08/10/2021 0242   BUN 18 08/10/2021 0242   BUN  21 08/26/2020 0916   CREATININE 0.96 08/10/2021 0242   CREATININE 0.85 05/18/2015 1028   CALCIUM 8.1 (L) 08/10/2021 0242   GFRNONAA 58 (L) 08/10/2021 0242   GFRAA 78 11/26/2019 1234    BNP    Component Value Date/Time   BNP 387.3 (H) 08/07/2021 1900     Imaging:  No results found.       Latest Ref Rng & Units 11/27/2013    3:33 PM  PFT Results  FVC-Pre L 1.89   FVC-Predicted Pre % 73   FVC-Post L 2.01   FVC-Predicted Post % 77   Pre FEV1/FVC % % 54   Post FEV1/FCV % % 54   FEV1-Pre L 1.02   FEV1-Predicted Pre % 52   FEV1-Post L 1.09   DLCO uncorrected ml/min/mmHg 9.47   DLCO UNC% % 41   DLCO corrected ml/min/mmHg 9.47   DLCO COR %Predicted % 41   DLVA Predicted % 69   TLC L 4.22   TLC % Predicted % 86   RV % Predicted % 95     No results found for: "NITRICOXIDE"      Assessment & Plan:   No problem-specific Assessment & Plan notes found for this encounter.   I spent *** minutes of dedicated to the care of this patient on the date of this encounter to include pre-visit review of records, face-to-face time with the patient discussing conditions above, post visit ordering of testing, clinical documentation with the electronic health record, making appropriate referrals as documented, and communicating necessary findings to members of the patients care team.  Clayton Bibles, NP 09/16/2021  Pt aware and understands NP's role.

## 2021-09-25 ENCOUNTER — Other Ambulatory Visit: Payer: Self-pay | Admitting: Neurology

## 2021-09-25 DIAGNOSIS — R4189 Other symptoms and signs involving cognitive functions and awareness: Secondary | ICD-10-CM

## 2021-09-25 DIAGNOSIS — F03A0 Unspecified dementia, mild, without behavioral disturbance, psychotic disturbance, mood disturbance, and anxiety: Secondary | ICD-10-CM

## 2021-09-25 DIAGNOSIS — G309 Alzheimer's disease, unspecified: Secondary | ICD-10-CM

## 2021-09-28 ENCOUNTER — Telehealth: Payer: Self-pay | Admitting: Emergency Medicine

## 2021-09-29 MED ORDER — BREZTRI AEROSPHERE 160-9-4.8 MCG/ACT IN AERO: 2.0000 | INHALATION_SPRAY | Freq: Two times a day (BID) | RESPIRATORY_TRACT | 5 refills | Status: AC

## 2021-09-29 NOTE — Telephone Encounter (Signed)
Rx for [name] has been sent to preferred pharmacy for pt. Attempted to call pt but unable to reach. Left a detailed message letting her know that the Rx was sent. Nothing further needed.

## 2021-10-07 ENCOUNTER — Encounter: Payer: Self-pay | Admitting: Nurse Practitioner

## 2021-10-07 ENCOUNTER — Ambulatory Visit (INDEPENDENT_AMBULATORY_CARE_PROVIDER_SITE_OTHER): Payer: Medicare Other | Admitting: Nurse Practitioner

## 2021-10-07 ENCOUNTER — Ambulatory Visit (INDEPENDENT_AMBULATORY_CARE_PROVIDER_SITE_OTHER): Payer: Medicare Other

## 2021-10-07 VITALS — BP 138/60 | HR 57 | Temp 98.2°F | Ht 63.0 in | Wt 125.2 lb

## 2021-10-07 DIAGNOSIS — D869 Sarcoidosis, unspecified: Secondary | ICD-10-CM

## 2021-10-07 DIAGNOSIS — I5032 Chronic diastolic (congestive) heart failure: Secondary | ICD-10-CM | POA: Diagnosis not present

## 2021-10-07 DIAGNOSIS — J441 Chronic obstructive pulmonary disease with (acute) exacerbation: Secondary | ICD-10-CM

## 2021-10-07 DIAGNOSIS — I480 Paroxysmal atrial fibrillation: Secondary | ICD-10-CM

## 2021-10-07 DIAGNOSIS — J449 Chronic obstructive pulmonary disease, unspecified: Secondary | ICD-10-CM

## 2021-10-07 DIAGNOSIS — J479 Bronchiectasis, uncomplicated: Secondary | ICD-10-CM | POA: Diagnosis not present

## 2021-10-07 MED ORDER — PREDNISONE 20 MG PO TABS: 20.0000 mg | ORAL_TABLET | Freq: Every day | ORAL | 0 refills | Status: AC

## 2021-10-07 NOTE — Assessment & Plan Note (Signed)
Stable on recent CT imaging. Slight increase in cough; possibly related to exposure to viral infection vs AECOPD. Cough is non-productive and no significant chest congestion. See above plan.

## 2021-10-07 NOTE — Assessment & Plan Note (Signed)
Stable granulomatous dx on CT imaging from June.

## 2021-10-07 NOTE — Patient Instructions (Signed)
Continue Breztri 2 puffs Twice daily. Brush tongue and rinse mouth afterwards Continue Albuterol inhaler 2 puffs every 6 hours as needed for shortness of breath or wheezing. Notify if symptoms persist despite rescue inhaler/neb use.   Prednisone 20 mg daily for 5 days. Take in AM with food.   Chest x ray today.  Follow up in 2 weeks with Sherri Jasmeen Fritsch,NP. Schedule your follow up with Dr. [name] in 4 months. If we need to move this up at your next visit, we will schedule something sooner then. If symptoms do not improve or worsen, please contact office for sooner follow up or seek emergency care.

## 2021-10-07 NOTE — Assessment & Plan Note (Signed)
Regular rhythm and rate on exam today. She is on diltiazem for rate control. Chronically anticoagulated with eliquis

## 2021-10-07 NOTE — Assessment & Plan Note (Signed)
She has had a progressive decline in her respiratory status over the last 6 months.  CT chest from June was stable.  She was started on Breztri which she did get some benefit from.  Seems that she could be having an acute exacerbation of her COPD related to exposure to viral infection.  We will treat her with prednisone burst to see if she has any perceived benefit from this.  Recommended that she continue triple therapy and as needed albuterol.  CXR today to rule out superimposed infection.  Patient Instructions  Continue Breztri 2 puffs Twice daily. Brush tongue and rinse mouth afterwards Continue Albuterol inhaler 2 puffs every 6 hours as needed for shortness of breath or wheezing. Notify if symptoms persist despite rescue inhaler/neb use.   Prednisone 20 mg daily for 5 days. Take in AM with food.   Chest x ray today.  Follow up in 2 weeks with Katie Mang Hazelrigg,NP. Schedule your follow up with Dr. [name] in 4 months. If we need to move this up at your next visit, we will schedule something sooner then. If symptoms do not improve or worsen, please contact office for sooner follow up or seek emergency care.

## 2021-10-07 NOTE — Assessment & Plan Note (Signed)
Hospitalized in June for CHF. Appears euvolemic on exam today. Assess x ray for any signs of volume overload. If present, will treat her with diuretic therapy.

## 2021-10-07 NOTE — Progress Notes (Signed)
$'@Patient'A$  ID: [name], female    DOB: 04/24/1934, 87 y.o.   MRN: [id number]  Chief Complaint  Patient presents with   Follow-up    Referring provider: [name], MD  HPI: 87 year old female, never smoker followed for severe obstructive lung disease and bronchiectasis associated with sarcoidosis.  She has patient Dr. [name] and last seen in office 08/17/2021.  Past medical history significant for chronic rhinitis, GERD, A-fib on Eliquis, CHF.  TEST/EVENTS:  11/27/2013 PFTs: FVC 73, FEV1 52, ratio 54, TLC 86, DLCO 41 08/08/2021 echocardiogram: EF 55 to 60%.  G2 DD.  RV size and function is normal.  Normal PASP.  There is mild MR.  TR is mild to moderate. 08/10/2021 CT chest without contrast: Atherosclerosis.  Calcified mediastinal lymph nodes are noted most consistent with prior granulomatous disease.  There is a small sliding-type hiatal hernia.  There is a very small left-sided pleural effusion.  Stable right apical and basilar scarring is noted as well.   08/17/2021: OV with Dr. [name].  Hospitalized earlier this month for chest discomfort superimposed on chronic progressive dyspnea with a mild troponin elevation.  She underwent left heart catheterization that showed stable significant CAD (unchanged).  She also is a history of paroxysmal SVT, unclear whether this was a factor during this admission.  There is no evidence of a COPD exacerbation.  A CT was performed which showed some calcified mediastinal adenopathy consistent with granulomatous disease, small right apical and basilar scarring without any evidence of pneumonia.  There is also a small left pleural effusion and hiatal hernia.  Functional capacity and exertional shortness of breath have both progressively worsened over the last 6 months.  She has been on Symbicort; recommended step up to [place].  10/07/2021: Today-follow-up Patient presents today with her daughter for follow-up.  She did get some benefit from step up to  [facility] after her last visit.  However, they report that over the last week or so, she has had some increased shortness of breath with exertion.  Gets winded walking any sort of distance.  They do not feel like her activity tolerance is what it used to be.  She has an occasional cough, which was slightly increased yesterday but today seems more so back to her baseline.  Nonproductive.  Her great granddaughter did recently get diagnosed with croup.  They are unsure if this is related.  Nasal congestion and drainage stable.  No significant wheezing, chest tightness.  She does not have any hemoptysis.  Denies any fevers, night sweats, chills, increased fatigue, lower extremity swelling, palpitations, orthopnea.  Using rescue a few times a week.  Allergies  Allergen Reactions   Levaquin [Levofloxacin] Other (See Comments)    Joint pain.. Per doctor not to take again   Oxycodone Nausea Only    Can take oxycodone with Zofran Other reaction(s): vomiting   Sulfonamide Derivatives Other (See Comments)    Possibly caused hepatitis   Sulfa Antibiotics Other (See Comments)    Possibly caused hepatitis   Morphine Nausea Only    Immunization History  Administered Date(s) Administered   Fluad Quad(high Dose 65+) 11/19/2019   Influenza Inj Mdck Quad Pf 11/05/2018   Influenza Split 10/28/2008, 11/23/2009, 11/17/2010, 11/29/2011, 10/18/2012, 11/18/2013   Influenza, High Dose Seasonal PF 11/02/2016, 11/04/2017   Influenza, Quadrivalent, Recombinant, Inj, Pf 11/04/2017, 11/29/2018   Influenza,inj,Quad PF,6+ Mos 11/18/2013, 11/12/2014   Influenza,inj,quad, With Preservative 11/26/2018, 12/24/2019   Influenza-Unspecified 11/05/2013, 11/06/2014, 12/07/2015   PFIZER(Purple Top)SARS-COV-2 Vaccination 03/14/2019,  04/04/2019, 12/12/2019   Pneumococcal Conjugate-13 02/25/2014   Pneumococcal Polysaccharide-23 05/03/2001   Pneumococcal-Unspecified 05/03/2001, 02/03/2016   Td 10/18/2011   Tdap 01/02/2012, 01/14/2012    Zoster, Live 10/18/2012, 11/08/2013    Past Medical History:  Diagnosis Date   Acute maxillary sinusitis 10/17/2017   Acute renal failure syndrome ([place]) 01/04/2019   Allergy    SEASONAL   Anemia    Arthritis    Asthma    "slight"    Baker's cyst of knee, right    CAD (coronary artery disease)    a. Myoview 10/15 - normal EF 70% // Myoview 11/16: EF 75%, normal perfusion, low risk // c. LHC 3/17 - LAD irregs, oLCx 70 (neg FFR), mRCA 30, EF 55-65% >> med Rx // Myoview 07/2018:  EF 73, extracardiac uptake, no significant ischemia (reviewed with Dr. Burt Knack), Low Risk // Myoview 6/22: EF 72, normal perfusion, low risk   Carotid stenosis    a. Carotid US 9/15 - Bilateral 1-39% ICA >> FU 2 years // b. Bilateral ICA 1-39 >> FU prn // Carotid US 06/2018: bilat 1-39; fu prn   Dyspnea    due to Sarcoidosis. When is windy or humed   Dysrhythmia    fast heart rate   Elbow fracture 04/2017   Right, had surgery   Esophageal reflux    Fracture of inferior pubic ramus ([place]) 02/28/2019   Hematochezia    Hip fracture ([place]) 12/12/2018   History of echocardiogram    a. Echo 10/15 - EF 60-65%, no RWMA   HLD (hyperlipidemia)    Hx of colonoscopy    Macular pucker, right eye    will have removed on 01/26/21   Osteoporosis    Other chronic pulmonary heart diseases    Paroxysmal supraventricular tachycardia ([place])    Pneumonia    Pneumonia 09/18/2019   Pre-op evaluation 06/11/2019   Pyelonephritis 07/29/2019   Sacral insufficiency fracture 12/12/2018   Sarcoidosis    Sepsis ([place]) 07/30/2019   Sepsis due to Escherichia coli (e. coli) ([place]) 07/29/2019   Sepsis due to urinary tract infection ([place]) 07/18/2019    Tobacco History: Social History   Tobacco Use  Smoking Status Never  Smokeless Tobacco Never   Counseling given: Not Answered   Outpatient Medications Prior to Visit  Medication Sig Dispense Refill   acetaminophen (TYLENOL) 500 MG tablet Take 1,000 mg by mouth every 6 (six) hours as needed  for mild pain.      albuterol (VENTOLIN HFA) 108 (90 Base) MCG/ACT inhaler Inhale 2 puffs into the lungs every 4 (four) hours as needed for wheezing or shortness of breath. 6.7 g 1   apixaban (ELIQUIS) 2.5 MG TABS tablet TAKE 1 TABLET(2.5 MG) BY MOUTH TWICE DAILY 180 tablet 1   aspirin 81 MG chewable tablet Chew 81 mg by mouth daily.     Budeson-Glycopyrrol-Formoterol (BREZTRI AEROSPHERE) 160-9-4.8 MCG/ACT AERO Inhale 2 puffs into the lungs in the morning and at bedtime. 10.7 g 5   Calcium Carbonate Antacid (TUMS ULTRA 1000 PO) Take 2,000-3,000 mg by mouth at bedtime as needed (acid reflux/indigestion/heartburn).     denosumab (PROLIA) 60 MG/ML SOLN injection Inject 60 mg into the skin every 6 (six) months. Administer in upper arm, thigh, or abdomen     diltiazem (CARDIZEM CD) 120 MG 24 hr capsule Take 1 capsule (120 mg total) by mouth daily. 90 capsule 3   donepezil (ARICEPT) 5 MG tablet TAKE 1 TABLET(5 MG) BY MOUTH AT BEDTIME 90 tablet 0  metoprolol tartrate (LOPRESSOR) 25 MG tablet Take 1 tablet (25 mg total) by mouth daily as needed (palpitations). 90 tablet 3   nitroGLYCERIN (NITROSTAT) 0.4 MG SL tablet Place 1 tablet (0.4 mg total) under the tongue every 5 (five) minutes as needed for chest pain. 25 tablet 3   omeprazole (PRILOSEC) 40 MG capsule Take 40 mg by mouth 2 (two) times daily.      oxybutynin (DITROPAN) 5 MG tablet Take 5 mg by mouth at bedtime.     sertraline (ZOLOFT) 25 MG tablet Take 25 mg by mouth at bedtime.     traMADol (ULTRAM) 50 MG tablet Take 50 mg by mouth every 8 (eight) hours as needed (pain).     Vibegron (GEMTESA) 75 MG TABS Take 75 mg by mouth at bedtime.     No facility-administered medications prior to visit.     Review of Systems:   Constitutional: No weight loss or gain, night sweats, fevers, chills, or lassitude. + Fatigue, chronic HEENT: No headaches, difficulty swallowing, tooth/dental problems, or sore throat. No sneezing, itching, ear ache. +nasal  congestion, post nasal drip (stable) CV:  No chest pain, orthopnea, PND, swelling in lower extremities, anasarca, dizziness, palpitations, syncope Resp: +shortness of breath with exertion; dry cough. No excess mucus or change in color of mucus. No hemoptysis. No wheezing.  No chest wall deformity GI:  No heartburn, indigestion, abdominal pain, nausea, vomiting, diarrhea, change in bowel habits, loss of appetite, bloody stools.  GU: No dysuria, change in color of urine, urgency or frequency.  No flank pain, no hematuria  Skin: No rash, lesions, ulcerations MSK:  No joint pain or swelling.  No decreased range of motion.  No back pain. Neuro: No dizziness or lightheadedness.  Psych: No depression or anxiety. Mood stable.     Physical Exam:  BP 138/60 (BP Location: Right Arm, Patient Position: Sitting, Cuff Size: Normal)   Pulse (!) 57   Temp 98.2 F (36.8 C) (Oral)   Ht '5\' 3"'$  (1.6 m)   Wt 125 lb 3.2 oz (56.8 kg)   LMP 02/22/1983 (Approximate)   SpO2 95%   BMI 22.18 kg/m   GEN: Pleasant, interactive, well-appearing; elderly; in no acute distress. HEENT:  Normocephalic and atraumatic. PERRLA. Sclera white. Nasal turbinates pink, moist and patent bilaterally. Clear rhinorrhea present. Oropharynx pink and moist, without exudate or edema. No lesions, ulcerations, or postnasal drip.  NECK:  Supple w/ fair ROM. No JVD present. Normal carotid impulses w/o bruits. Thyroid symmetrical with no goiter or nodules palpated. No lymphadenopathy.   CV: RRR, no m/r/g, no peripheral edema. Pulses intact, +2 bilaterally. No cyanosis, pallor or clubbing. PULMONARY:  Unlabored, regular breathing. Diminished with very minimal end expiratory wheeze bilaterally A&P. No accessory muscle use. No dullness to percussion. GI: BS present and normoactive. Soft, non-tender to palpation. No organomegaly or masses detected. No CVA tenderness. MSK: No erythema, warmth or tenderness. Cap refil <2 sec all extrem. No  deformities or joint swelling noted.  Neuro: A/Ox3. No focal deficits noted.   Skin: Warm, no lesions or rashe Psych: Normal affect and behavior. Judgement and thought content appropriate.     Lab Results:  CBC    Component Value Date/Time   WBC 9.4 08/10/2021 0242   RBC 3.61 (L) 08/10/2021 0242   HGB 10.6 (L) 08/10/2021 0242   HGB 12.7 07/21/2020 1315   HCT 34.8 (L) 08/10/2021 0242   HCT 38.4 07/21/2020 1315   PLT 230 08/10/2021 0242   PLT 309  07/21/2020 1315   MCV 96.4 08/10/2021 0242   MCV 94 07/21/2020 1315   MCH 29.4 08/10/2021 0242   MCHC 30.5 08/10/2021 0242   RDW 14.6 08/10/2021 0242   RDW 12.7 07/21/2020 1315   LYMPHSABS 1.7 08/10/2021 0242   MONOABS 0.8 08/10/2021 0242   EOSABS 0.2 08/10/2021 0242   BASOSABS 0.0 08/10/2021 0242    BMET    Component Value Date/Time   NA 133 (L) 08/10/2021 0242   NA 140 08/26/2020 0916   K 3.7 08/10/2021 0242   CL 105 08/10/2021 0242   CO2 21 (L) 08/10/2021 0242   GLUCOSE 84 08/10/2021 0242   BUN 18 08/10/2021 0242   BUN 21 08/26/2020 0916   CREATININE 0.96 08/10/2021 0242   CREATININE 0.85 05/18/2015 1028   CALCIUM 8.1 (L) 08/10/2021 0242   GFRNONAA 58 (L) 08/10/2021 0242   GFRAA 78 11/26/2019 1234    BNP    Component Value Date/Time   BNP 387.3 (H) 08/07/2021 1900     Imaging:  DG Chest 2 View  Result Date: 10/07/2021 CLINICAL DATA:  Cough and shortness of breath. EXAM: CHEST - 2 VIEW COMPARISON:  None Available. FINDINGS: The heart size and mediastinal contours are within normal limits. Stable chronic lung disease/emphysema. There is no evidence of pulmonary edema, consolidation, pneumothorax, nodule or pleural fluid. Degenerative disease of the thoracic spine. IMPRESSION: No active cardiopulmonary disease. Electronically Signed   By: Aletta Edouard M.D.   On: 10/07/2021 17:19         Latest Ref Rng & Units 11/27/2013    3:33 PM  PFT Results  FVC-Pre L 1.89   FVC-Predicted Pre % 73   FVC-Post L 2.01    FVC-Predicted Post % 77   Pre FEV1/FVC % % 54   Post FEV1/FCV % % 54   FEV1-Pre L 1.02   FEV1-Predicted Pre % 52   FEV1-Post L 1.09   DLCO uncorrected ml/min/mmHg 9.47   DLCO UNC% % 41   DLCO corrected ml/min/mmHg 9.47   DLCO COR %Predicted % 41   DLVA Predicted % 69   TLC L 4.22   TLC % Predicted % 86   RV % Predicted % 95     No results found for: "NITRICOXIDE"      Assessment & Plan:   COPD (chronic obstructive pulmonary disease) ([place]) She has had a progressive decline in her respiratory status over the last 6 months.  CT chest from June was stable.  She was started on Breztri which she did get some benefit from.  Seems that she could be having an acute exacerbation of her COPD related to exposure to viral infection.  We will treat her with prednisone burst to see if she has any perceived benefit from this.  Recommended that she continue triple therapy and as needed albuterol.  CXR today to rule out superimposed infection.  Patient Instructions  Continue Breztri 2 puffs Twice daily. Brush tongue and rinse mouth afterwards Continue Albuterol inhaler 2 puffs every 6 hours as needed for shortness of breath or wheezing. Notify if symptoms persist despite rescue inhaler/neb use.   Prednisone 20 mg daily for 5 days. Take in AM with food.   Chest x ray today.  Follow up in 2 weeks with Katie Juni Glaab,NP. Schedule your follow up with Dr. [name] in 4 months. If we need to move this up at your next visit, we will schedule something sooner then. If symptoms do not improve or worsen, please contact  office for sooner follow up or seek emergency care.     Bronchiectasis without complication ([place]) Stable on recent CT imaging. Slight increase in cough; possibly related to exposure to viral infection vs AECOPD. Cough is non-productive and no significant chest congestion. See above plan.  Chronic heart failure with preserved ejection fraction [facility]) Hospitalized in June for CHF. Appears  euvolemic on exam today. Assess x ray for any signs of volume overload. If present, will treat her with diuretic therapy.   Atrial fibrillation ([facility]) Regular rhythm and rate on exam today. She is on diltiazem for rate control. Chronically anticoagulated with eliquis   SARCOIDOSIS, PULMONARY Stable granulomatous dx on CT imaging from June.    I spent 35 minutes of dedicated to the care of this patient on the date of this encounter to include pre-visit review of records, face-to-face time with the patient discussing conditions above, post visit ordering of testing, clinical documentation with the electronic health record, making appropriate referrals as documented, and communicating necessary findings to members of the patients care team.  Clayton Bibles, NP 10/08/2021  Pt aware and understands NP's role.

## 2021-10-08 ENCOUNTER — Encounter: Payer: Self-pay | Admitting: Nurse Practitioner

## 2021-10-08 NOTE — Progress Notes (Signed)
Notified pt's daugther, [name] (DPR), CXR was without any evidence of acute process. All questions answered.

## 2021-10-14 ENCOUNTER — Ambulatory Visit: Payer: Medicare Other | Admitting: Adult Health

## 2021-10-20 ENCOUNTER — Ambulatory Visit: Payer: Medicare Other | Admitting: Adult Health

## 2021-10-22 ENCOUNTER — Ambulatory Visit (INDEPENDENT_AMBULATORY_CARE_PROVIDER_SITE_OTHER): Payer: Medicare Other | Admitting: Nurse Practitioner

## 2021-10-22 ENCOUNTER — Encounter: Payer: Self-pay | Admitting: Nurse Practitioner

## 2021-10-22 VITALS — BP 136/62 | HR 54 | Temp 98.4°F | Ht 63.0 in | Wt 125.2 lb

## 2021-10-22 DIAGNOSIS — I480 Paroxysmal atrial fibrillation: Secondary | ICD-10-CM

## 2021-10-22 DIAGNOSIS — I5032 Chronic diastolic (congestive) heart failure: Secondary | ICD-10-CM

## 2021-10-22 DIAGNOSIS — D869 Sarcoidosis, unspecified: Secondary | ICD-10-CM | POA: Diagnosis not present

## 2021-10-22 DIAGNOSIS — J479 Bronchiectasis, uncomplicated: Secondary | ICD-10-CM | POA: Diagnosis not present

## 2021-10-22 DIAGNOSIS — J441 Chronic obstructive pulmonary disease with (acute) exacerbation: Secondary | ICD-10-CM | POA: Diagnosis not present

## 2021-10-22 MED ORDER — IPRATROPIUM-ALBUTEROL 0.5-2.5 (3) MG/3ML IN SOLN: 3.0000 mL | Freq: Four times a day (QID) | RESPIRATORY_TRACT | 5 refills | Status: DC | PRN

## 2021-10-22 NOTE — Assessment & Plan Note (Addendum)
Stable on recent CT imaging from June.  Cough is minimal and nonproductive.  No significant chest congestion.  See above plan.

## 2021-10-22 NOTE — Progress Notes (Signed)
$'@Patient'L$  ID: [name], female    DOB: [birth year], 87 y.o.   MRN: [id number]  Chief Complaint  Patient presents with   Follow-up    Pt still having SOB with exertion.    Referring provider: [name], MD  HPI: 87 year old female, never smoker followed for severe obstructive lung disease and bronchiectasis associated with sarcoidosis.  She has patient Dr. [name] and last seen in office 10/07/2021 by [facility] NP.  Past medical history significant for chronic rhinitis, GERD, A-fib on Eliquis, CHF.  TEST/EVENTS:  11/27/2013 PFTs: FVC 73, FEV1 52, ratio 54, TLC 86, DLCO 41 08/08/2021 echocardiogram: EF 55 to 60%.  G2 DD.  RV size and function is normal.  Normal PASP.  There is mild MR.  TR is mild to moderate. 08/10/2021 CT chest without contrast: Atherosclerosis.  Calcified mediastinal lymph nodes are noted most consistent with prior granulomatous disease.  There is a small sliding-type hiatal hernia.  There is a very small left-sided pleural effusion.  Stable right apical and basilar scarring is noted as well.  10/07/2021 CXR 2 view: stable chronic lung disease/emphysema.   08/17/2021: OV with Dr. [name].  Hospitalized earlier this month for chest discomfort superimposed on chronic progressive dyspnea with a mild troponin elevation.  She underwent left heart catheterization that showed stable significant CAD (unchanged).  She also is a history of paroxysmal SVT, unclear whether this was a factor during this admission.  There is no evidence of a COPD exacerbation.  A CT was performed which showed some calcified mediastinal adenopathy consistent with granulomatous disease, small right apical and basilar scarring without any evidence of pneumonia.  There is also a small left pleural effusion and hiatal hernia.  Functional capacity and exertional shortness of breath have both progressively worsened over the last 6 months.  She has been on Symbicort; recommended step up to [facility].  10/07/2021: OV  with Kamani Lewter NP for follow-up.  She did get some benefit from step up to [facility] after her last visit.  However, they report that over the last week or so, she has had some increased shortness of breath with exertion.  Gets winded walking any sort of distance.  They do not feel like her activity tolerance is what it used to be.  She has an occasional cough, which was slightly increased yesterday but today seems more so back to her baseline.  Nonproductive.  Her great granddaughter did recently get diagnosed with croup.  They are unsure if this is related.  Nasal congestion and drainage stable.  No significant wheezing, chest tightness.  She does not have any hemoptysis.  Denies any fevers, night sweats, chills, increased fatigue, lower extremity swelling, palpitations, orthopnea.  Using rescue a few times a week. She has had a progressive decline in her respiratory status over the last 6 months.  CT chest from June was stable.  She was started on Breztri which she did get some benefit from.  Seems that she could be having an acute exacerbation of her COPD related to exposure to viral infection.  We will treat her with prednisone burst to see if she has any perceived benefit from this.  Recommended that she continue triple therapy and as needed albuterol.  CXR today to rule out superimposed infection.  10/22/2021: Today - follow up Patient presents today with her daughter for follow-up after being treated for potential AECOPD versus progression of disease.  She reports that she feels relatively unchanged.  She actually ended up never taking  the prednisone.  She took 1 dose and then forgot to take the rest of them.  Her daughter had gone out of town with her husband and had left the medicine out but the patient did not realize that she was post to be taking it.  She does tend to have some forgetfulness.  Does state that she never forgets to take her inhaler and her other medicines are in her pillbox.  She did not notice  any significant benefit from the 1 dose that she did take.  Her breathing overall feels the same.  She gets winded with minimal activity, which has been her baseline for about the past 6 months.  Previous imaging did not show any evidence of progression of disease.  She has an occasional nonproductive cough , which is her baseline.  She denies any significant chest congestion or wheezing.  Denies any fevers, chills, skin lesions, vision changes, hemoptysis, weight loss, anorexia, orthopnea, lower extremity swelling.  She continues on [employer] inhaler.  Does not have a nebulizer machine.  She does use albuterol daily.  Walking oximetry at previous visit without desaturations.  Allergies  Allergen Reactions   Levaquin [Levofloxacin] Other (See Comments)    Joint pain.. Per doctor not to take again   Oxycodone Nausea Only    Can take oxycodone with Zofran Other reaction(s): vomiting   Sulfonamide Derivatives Other (See Comments)    Possibly caused hepatitis   Sulfa Antibiotics Other (See Comments)    Possibly caused hepatitis   Morphine Nausea Only    Immunization History  Administered Date(s) Administered   Fluad Quad(high Dose 65+) 11/19/2019   Influenza Inj Mdck Quad Pf 11/05/2018   Influenza Split 10/28/2008, 11/23/2009, 11/17/2010, 11/29/2011, 10/18/2012, 11/18/2013   Influenza, High Dose Seasonal PF 11/02/2016, 11/04/2017   Influenza, Quadrivalent, Recombinant, Inj, Pf 11/04/2017, 11/29/2018   Influenza,inj,Quad PF,6+ Mos 11/18/2013, 11/12/2014   Influenza,inj,quad, With Preservative 11/26/2018, 12/24/2019   Influenza-Unspecified 11/05/2013, 11/06/2014, 12/07/2015   PFIZER(Purple Top)SARS-COV-2 Vaccination 03/14/2019, 04/04/2019, 12/12/2019   Pneumococcal Conjugate-13 02/25/2014   Pneumococcal Polysaccharide-23 05/03/2001   Pneumococcal-Unspecified 05/03/2001, 02/03/2016   Td 10/18/2011   Tdap 01/02/2012, 01/14/2012   Zoster, Live 10/18/2012, 11/08/2013    Past Medical History:   Diagnosis Date   Acute maxillary sinusitis 10/17/2017   Acute renal failure syndrome ([place]) 01/04/2019   Allergy    SEASONAL   Anemia    Arthritis    Asthma    "slight"    Baker's cyst of knee, right    CAD (coronary artery disease)    a. Myoview 10/15 - normal EF 70% // Myoview 11/16: EF 75%, normal perfusion, low risk // c. LHC 3/17 - LAD irregs, oLCx 70 (neg FFR), mRCA 30, EF 55-65% >> med Rx // Myoview 07/2018:  EF 73, extracardiac uptake, no significant ischemia (reviewed with Dr. Burt Knack), Low Risk // Myoview 6/22: EF 72, normal perfusion, low risk   Carotid stenosis    a. Carotid US 9/15 - Bilateral 1-39% ICA >> FU 2 years // b. Bilateral ICA 1-39 >> FU prn // Carotid US 06/2018: bilat 1-39; fu prn   Dyspnea    due to Sarcoidosis. When is windy or humed   Dysrhythmia    fast heart rate   Elbow fracture 04/2017   Right, had surgery   Esophageal reflux    Fracture of inferior pubic ramus ([place]) 02/28/2019   Hematochezia    Hip fracture ([place]) 12/12/2018   History of echocardiogram    a. Echo 10/15 -  EF 60-65%, no RWMA   HLD (hyperlipidemia)    Hx of colonoscopy    Macular pucker, right eye    will have removed on 01/26/21   Osteoporosis    Other chronic pulmonary heart diseases    Paroxysmal supraventricular tachycardia ([place])    Pneumonia    Pneumonia 09/18/2019   Pre-op evaluation 06/11/2019   Pyelonephritis 07/29/2019   Sacral insufficiency fracture 12/12/2018   Sarcoidosis    Sepsis ([place]) 07/30/2019   Sepsis due to Escherichia coli (e. coli) ([place]) 07/29/2019   Sepsis due to urinary tract infection ([place]) 07/18/2019    Tobacco History: Social History   Tobacco Use  Smoking Status Never  Smokeless Tobacco Never   Counseling given: Not Answered   Outpatient Medications Prior to Visit  Medication Sig Dispense Refill   acetaminophen (TYLENOL) 500 MG tablet Take 1,000 mg by mouth every 6 (six) hours as needed for mild pain.      albuterol (VENTOLIN HFA) 108 (90 Base) MCG/ACT  inhaler Inhale 2 puffs into the lungs every 4 (four) hours as needed for wheezing or shortness of breath. 6.7 g 1   apixaban (ELIQUIS) 2.5 MG TABS tablet TAKE 1 TABLET(2.5 MG) BY MOUTH TWICE DAILY 180 tablet 1   aspirin 81 MG chewable tablet Chew 81 mg by mouth daily.     Budeson-Glycopyrrol-Formoterol (BREZTRI AEROSPHERE) 160-9-4.8 MCG/ACT AERO Inhale 2 puffs into the lungs in the morning and at bedtime. 10.7 g 5   Calcium Carbonate Antacid (TUMS ULTRA 1000 PO) Take 2,000-3,000 mg by mouth at bedtime as needed (acid reflux/indigestion/heartburn).     denosumab (PROLIA) 60 MG/ML SOLN injection Inject 60 mg into the skin every 6 (six) months. Administer in upper arm, thigh, or abdomen     diltiazem (CARDIZEM CD) 120 MG 24 hr capsule Take 1 capsule (120 mg total) by mouth daily. 90 capsule 3   donepezil (ARICEPT) 5 MG tablet TAKE 1 TABLET(5 MG) BY MOUTH AT BEDTIME 90 tablet 0   metoprolol tartrate (LOPRESSOR) 25 MG tablet Take 1 tablet (25 mg total) by mouth daily as needed (palpitations). 90 tablet 3   nitroGLYCERIN (NITROSTAT) 0.4 MG SL tablet Place 1 tablet (0.4 mg total) under the tongue every 5 (five) minutes as needed for chest pain. 25 tablet 3   omeprazole (PRILOSEC) 40 MG capsule Take 40 mg by mouth 2 (two) times daily.      oxybutynin (DITROPAN) 5 MG tablet Take 5 mg by mouth at bedtime.     sertraline (ZOLOFT) 25 MG tablet Take 25 mg by mouth at bedtime.     traMADol (ULTRAM) 50 MG tablet Take 50 mg by mouth every 8 (eight) hours as needed (pain).     Vibegron (GEMTESA) 75 MG TABS Take 75 mg by mouth at bedtime.     No facility-administered medications prior to visit.     Review of Systems:   Constitutional: No weight loss or gain, night sweats, fevers, chills, or lassitude. + Fatigue, chronic HEENT: No headaches, difficulty swallowing, tooth/dental problems, or sore throat. No sneezing, itching, ear ache. +nasal congestion, post nasal drip (stable) CV:  No chest pain, orthopnea,  PND, swelling in lower extremities, anasarca, dizziness, palpitations, syncope Resp: +shortness of breath with exertion; dry cough. No excess mucus or change in color of mucus. No hemoptysis. No wheezing.  No chest wall deformity GI:  No heartburn, indigestion, abdominal pain, nausea, vomiting, diarrhea, change in bowel habits, loss of appetite, bloody stools.  GU: No dysuria, change in  color of urine, urgency or frequency.  No flank pain, no hematuria  Skin: No rash, lesions, ulcerations MSK:  No joint pain or swelling.  No decreased range of motion.  No back pain. Neuro: No dizziness or lightheadedness.  Psych: No depression or anxiety. Mood stable.     Physical Exam:  BP 136/62 (BP Location: Right Arm, Patient Position: Sitting, Cuff Size: Normal)   Pulse (!) 54   Temp 98.4 F (36.9 C) (Oral)   Ht '5\' 3"'$  (1.6 m)   Wt 125 lb 3.2 oz (56.8 kg)   LMP 02/22/1983 (Approximate)   SpO2 97%   BMI 22.18 kg/m   GEN: Pleasant, interactive, well-appearing; elderly; in no acute distress. HEENT:  Normocephalic and atraumatic. PERRLA. Sclera white. Nasal turbinates pink, moist and patent bilaterally. Clear rhinorrhea present. Oropharynx pink and moist, without exudate or edema. No lesions, ulcerations, or postnasal drip.  NECK:  Supple w/ fair ROM. No JVD present. Normal carotid impulses w/o bruits. Thyroid symmetrical with no goiter or nodules palpated. No lymphadenopathy.   CV: RRR, no m/r/g, no peripheral edema. Pulses intact, +2 bilaterally. No cyanosis, pallor or clubbing. PULMONARY:  Unlabored, regular breathing. Diminished bases bilaterally A&P w/o wheezes/rales/rhonchi. No accessory muscle use. No dullness to percussion. GI: BS present and normoactive. Soft, non-tender to palpation. No organomegaly or masses detected. No CVA tenderness. MSK: No erythema, warmth or tenderness. Cap refil <2 sec all extrem. No deformities or joint swelling noted.  Neuro: A/Ox3. No focal deficits noted.   Skin:  Warm, no lesions or rashe Psych: Normal affect and behavior. Judgement and thought content appropriate.     Lab Results:  CBC    Component Value Date/Time   WBC 9.4 08/10/2021 0242   RBC 3.61 (L) 08/10/2021 0242   HGB 10.6 (L) 08/10/2021 0242   HGB 12.7 07/21/2020 1315   HCT 34.8 (L) 08/10/2021 0242   HCT 38.4 07/21/2020 1315   PLT 230 08/10/2021 0242   PLT 309 07/21/2020 1315   MCV 96.4 08/10/2021 0242   MCV 94 07/21/2020 1315   MCH 29.4 08/10/2021 0242   MCHC 30.5 08/10/2021 0242   RDW 14.6 08/10/2021 0242   RDW 12.7 07/21/2020 1315   LYMPHSABS 1.7 08/10/2021 0242   MONOABS 0.8 08/10/2021 0242   EOSABS 0.2 08/10/2021 0242   BASOSABS 0.0 08/10/2021 0242    BMET    Component Value Date/Time   NA 133 (L) 08/10/2021 0242   NA 140 08/26/2020 0916   K 3.7 08/10/2021 0242   CL 105 08/10/2021 0242   CO2 21 (L) 08/10/2021 0242   GLUCOSE 84 08/10/2021 0242   BUN 18 08/10/2021 0242   BUN 21 08/26/2020 0916   CREATININE 0.96 08/10/2021 0242   CREATININE 0.85 05/18/2015 1028   CALCIUM 8.1 (L) 08/10/2021 0242   GFRNONAA 58 (L) 08/10/2021 0242   GFRAA 78 11/26/2019 1234    BNP    Component Value Date/Time   BNP 387.3 (H) 08/07/2021 1900     Imaging:  DG Chest 2 View  Result Date: 10/07/2021 CLINICAL DATA:  Cough and shortness of breath. EXAM: CHEST - 2 VIEW COMPARISON:  None Available. FINDINGS: The heart size and mediastinal contours are within normal limits. Stable chronic lung disease/emphysema. There is no evidence of pulmonary edema, consolidation, pneumothorax, nodule or pleural fluid. Degenerative disease of the thoracic spine. IMPRESSION: No active cardiopulmonary disease. Electronically Signed   By: Aletta Edouard M.D.   On: 10/07/2021 17:19  Latest Ref Rng & Units 11/27/2013    3:33 PM  PFT Results  FVC-Pre L 1.89   FVC-Predicted Pre % 73   FVC-Post L 2.01   FVC-Predicted Post % 77   Pre FEV1/FVC % % 54   Post FEV1/FCV % % 54   FEV1-Pre L  1.02   FEV1-Predicted Pre % 52   FEV1-Post L 1.09   DLCO uncorrected ml/min/mmHg 9.47   DLCO UNC% % 41   DLCO corrected ml/min/mmHg 9.47   DLCO COR %Predicted % 41   DLVA Predicted % 69   TLC L 4.22   TLC % Predicted % 86   RV % Predicted % 95     No results found for: "NITRICOXIDE"      Assessment & Plan:   COPD (chronic obstructive pulmonary disease) (HCC) Obstructive lung disease related to bronchiectasis associated with sarcoidosis.  She has had progressive worsening of her dyspnea over the past 6 months.  Thought at her last visit that she may be having a mild acute flare given her exposure to a recent viral infection.  She ended up forgetting to complete the prednisone burst.  Today feels relatively the same.  Based on this, suspect that it is just progression of her disease, CHF and deconditioning.  Recommended pulmonary rehab.  She is not entirely sure if she wants to do this at this point.  She will think about it and let us know.  Encouraged her to remain as active as possible.  We will send for her to have nebulizer machine and try DuoNebs.  If she gets significant benefit from this, we can consider changing her to triple therapy nebs from [place].  Walking oximetry at past visit without desaturations.  Advised to monitor oxygen levels at home when she becomes short of breath.  Patient Instructions  Continue Breztri 2 puffs Twice daily. Brush tongue and rinse mouth afterwards Continue Albuterol inhaler 2 puffs every 6 hours as needed for shortness of breath or wheezing. Notify if symptoms persist despite rescue inhaler/neb use. We will send nebulizer treatment for you to try. You can use duoneb 3 mL every 6 hours as needed for shortness of breath. If you notice a difference, we could consider changing your Breztri inhaler over the nebulized medicines   Think about pulmonary rehab. Let me know if you would like a referral to this.    Follow up in 4 weeks with Dr [name] or Joellen Jersey  Brannon Levene,NP.  If symptoms do not improve or worsen, please contact office for sooner follow up or seek emergency care.     Bronchiectasis without complication ([place]) Stable on recent CT imaging from June.  Cough is minimal and nonproductive.  No significant chest congestion.  See above plan.  SARCOIDOSIS, PULMONARY Stable granulomatous dx on CT imaging from June.   Chronic heart failure with preserved ejection fraction [facility]) Hospitalized in June for CHF. Appears euvolemic on exam today.  Follow-up with cardiology as scheduled.  Atrial fibrillation (HCC) Regular rhythm and rate on exam today. She is on diltiazem for rate control. Chronically anticoagulated with eliquis     I spent 35 minutes of dedicated to the care of this patient on the date of this encounter to include pre-visit review of records, face-to-face time with the patient discussing conditions above, post visit ordering of testing, clinical documentation with the electronic health record, making appropriate referrals as documented, and communicating necessary findings to members of the patients care team.  Clayton Bibles, NP  10/22/2021  Pt aware and understands NP's role.

## 2021-10-22 NOTE — Assessment & Plan Note (Addendum)
Obstructive lung disease related to bronchiectasis associated with sarcoidosis.  She has had progressive worsening of her dyspnea over the past 6 months.  Thought at her last visit that she may be having a mild acute flare given her exposure to a recent viral infection.  She ended up forgetting to complete the prednisone burst.  Today feels relatively the same.  Based on this, suspect that it is just progression of her disease, CHF and deconditioning.  Recommended pulmonary rehab.  She is not entirely sure if she wants to do this at this point.  She will think about it and let us know.  Encouraged her to remain as active as possible.  We will send for her to have nebulizer machine and try DuoNebs.  If she gets significant benefit from this, we can consider changing her to triple therapy nebs from [place].  Walking oximetry at past visit without desaturations.  Advised to monitor oxygen levels at home when she becomes short of breath.  Patient Instructions  Continue Breztri 2 puffs Twice daily. Brush tongue and rinse mouth afterwards Continue Albuterol inhaler 2 puffs every 6 hours as needed for shortness of breath or wheezing. Notify if symptoms persist despite rescue inhaler/neb use. We will send nebulizer treatment for you to try. You can use duoneb 3 mL every 6 hours as needed for shortness of breath. If you notice a difference, we could consider changing your Breztri inhaler over the nebulized medicines   Think about pulmonary rehab. Let me know if you would like a referral to this.    Follow up in 4 weeks with Dr [name] or Joellen Jersey Avelino Herren,NP.  If symptoms do not improve or worsen, please contact office for sooner follow up or seek emergency care.

## 2021-10-22 NOTE — Patient Instructions (Addendum)
Continue Breztri 2 puffs Twice daily. Brush tongue and rinse mouth afterwards Continue Albuterol inhaler 2 puffs every 6 hours as needed for shortness of breath or wheezing. Notify if symptoms persist despite rescue inhaler/neb use. We will send nebulizer treatment for you to try. You can use duoneb 3 mL every 6 hours as needed for shortness of breath. If you notice a difference, we could consider changing your Breztri inhaler over the nebulized medicines   Think about pulmonary rehab. Let me know if you would like a referral to this.    Follow up in 4 weeks with Dr [name] or Joellen Jersey Kimoni Pagliarulo,NP.  If symptoms do not improve or worsen, please contact office for sooner follow up or seek emergency care.

## 2021-10-22 NOTE — Assessment & Plan Note (Signed)
Regular rhythm and rate on exam today. She is on diltiazem for rate control. Chronically anticoagulated with eliquis

## 2021-10-22 NOTE — Assessment & Plan Note (Signed)
Stable granulomatous dx on CT imaging from June.

## 2021-10-22 NOTE — Assessment & Plan Note (Signed)
Hospitalized in June for CHF. Appears euvolemic on exam today.  Follow-up with cardiology as scheduled.

## 2021-11-01 ENCOUNTER — Ambulatory Visit
Admission: RE | Admit: 2021-11-01 | Discharge: 2021-11-01 | Disposition: A | Discharge status: Home / Self Care | Payer: Medicare Other | Source: Ambulatory Visit | Attending: Neurology | Admitting: Neurology

## 2021-11-01 DIAGNOSIS — F03A0 Unspecified dementia, mild, without behavioral disturbance, psychotic disturbance, mood disturbance, and anxiety: Secondary | ICD-10-CM

## 2021-11-01 DIAGNOSIS — R4189 Other symptoms and signs involving cognitive functions and awareness: Secondary | ICD-10-CM

## 2021-11-01 DIAGNOSIS — G309 Alzheimer's disease, unspecified: Secondary | ICD-10-CM

## 2021-11-01 DIAGNOSIS — I999 Unspecified disorder of circulatory system: Secondary | ICD-10-CM

## 2021-11-01 MED ORDER — GADOBENATE DIMEGLUMINE 529 MG/ML IV SOLN
11.0000 mL | Freq: Once | INTRAVENOUS | Status: AC | PRN
  Administered 2021-11-01: 11 mL via INTRAVENOUS

## 2021-11-04 ENCOUNTER — Encounter: Payer: Self-pay | Admitting: Neurology

## 2021-11-04 DIAGNOSIS — G309 Alzheimer's disease, unspecified: Secondary | ICD-10-CM

## 2021-11-04 DIAGNOSIS — R4189 Other symptoms and signs involving cognitive functions and awareness: Secondary | ICD-10-CM

## 2021-11-04 DIAGNOSIS — F03A0 Unspecified dementia, mild, without behavioral disturbance, psychotic disturbance, mood disturbance, and anxiety: Secondary | ICD-10-CM

## 2021-11-04 DIAGNOSIS — I999 Unspecified disorder of circulatory system: Secondary | ICD-10-CM

## 2021-11-04 DIAGNOSIS — R413 Other amnesia: Secondary | ICD-10-CM

## 2021-11-04 DIAGNOSIS — J449 Chronic obstructive pulmonary disease, unspecified: Secondary | ICD-10-CM

## 2021-11-08 ENCOUNTER — Encounter: Payer: Medicare Other | Admitting: Psychology

## 2021-11-09 NOTE — Telephone Encounter (Signed)
From Dr Jaynee Eagles: Please refer to Dr. Cecelia Byars instead thank you   Order placed for referral to Dr Cecelia Byars at [facility].

## 2021-11-10 NOTE — Addendum Note (Signed)
Addended by: Gildardo Griffes on: 11/10/2021 07:15 AM   Modules accepted: Orders

## 2021-11-18 ENCOUNTER — Ambulatory Visit: Payer: Medicare Other | Admitting: Nurse Practitioner

## 2021-11-25 ENCOUNTER — Ambulatory Visit: Payer: Medicare Other | Admitting: Neurology

## 2021-12-03 ENCOUNTER — Ambulatory Visit: Payer: Medicare Other | Admitting: Nurse Practitioner

## 2021-12-23 ENCOUNTER — Other Ambulatory Visit: Payer: Self-pay | Admitting: Neurology

## 2021-12-23 MED ORDER — SERTRALINE HCL 50 MG PO TABS: 50.0000 mg | ORAL_TABLET | Freq: Every day | ORAL | 1 refills | Status: AC

## 2021-12-24 ENCOUNTER — Ambulatory Visit: Payer: Medicare Other | Admitting: Nurse Practitioner

## 2022-01-05 ENCOUNTER — Telehealth: Payer: Self-pay | Admitting: Nurse Practitioner

## 2022-01-05 NOTE — Telephone Encounter (Signed)
Patient's daughter would like the nurse to call regarding whether the patient should get the RSV vaccine since some of her grandchildren around her have been diagnosed with RSV.  Please call to discuss further at [phone]

## 2022-01-05 NOTE — Telephone Encounter (Signed)
Called and spoke with pt's daughter [name] letting her know that we are recommending all of our patients to get the RSV vaccine and she verbalized understanding. While speaking with [name], she said that pt was seen by PCP and was diagnosed with strep throat and was currently on amoxicillin to help treat that. Pt is scheduled for a f/u appt with [facility] Friday 11/17 which she said she may need to cancel but she said she would call us before pt's appt if needed. Nothing further needed.

## 2022-01-07 ENCOUNTER — Ambulatory Visit: Payer: Medicare Other | Admitting: Nurse Practitioner

## 2022-01-31 ENCOUNTER — Other Ambulatory Visit: Payer: Self-pay | Admitting: Cardiovascular Disease

## 2022-01-31 DIAGNOSIS — I480 Paroxysmal atrial fibrillation: Secondary | ICD-10-CM

## 2022-01-31 NOTE — Telephone Encounter (Signed)
Prescription refill request for Eliquis received. Indication:afib Last office visit:7/23 Scr:0.9 Age: 87 Weight:56.8  kg  Prescription refilled

## 2022-02-12 ENCOUNTER — Other Ambulatory Visit: Payer: Self-pay | Admitting: Emergency Medicine

## 2022-02-15 ENCOUNTER — Other Ambulatory Visit: Payer: Self-pay | Admitting: Emergency Medicine

## 2022-02-18 ENCOUNTER — Ambulatory Visit: Payer: Medicare Other | Admitting: Cardiovascular Disease

## 2022-03-04 ENCOUNTER — Ambulatory Visit (INDEPENDENT_AMBULATORY_CARE_PROVIDER_SITE_OTHER)
Admission: RE | Admit: 2022-03-04 | Discharge: 2022-03-04 | Disposition: A | Discharge status: Home / Self Care | Payer: Medicare Other | Source: Ambulatory Visit | Attending: Nurse Practitioner | Admitting: Nurse Practitioner

## 2022-03-04 ENCOUNTER — Encounter: Payer: Self-pay | Admitting: Nurse Practitioner

## 2022-03-04 ENCOUNTER — Ambulatory Visit (INDEPENDENT_AMBULATORY_CARE_PROVIDER_SITE_OTHER): Payer: Medicare Other | Admitting: Nurse Practitioner

## 2022-03-04 ENCOUNTER — Telehealth: Payer: Self-pay | Admitting: Emergency Medicine

## 2022-03-04 VITALS — BP 100/70 | HR 63 | Ht 63.0 in | Wt 127.4 lb

## 2022-03-04 DIAGNOSIS — J449 Chronic obstructive pulmonary disease, unspecified: Secondary | ICD-10-CM | POA: Diagnosis not present

## 2022-03-04 DIAGNOSIS — J471 Bronchiectasis with (acute) exacerbation: Secondary | ICD-10-CM | POA: Diagnosis not present

## 2022-03-04 DIAGNOSIS — J069 Acute upper respiratory infection, unspecified: Secondary | ICD-10-CM

## 2022-03-04 DIAGNOSIS — J441 Chronic obstructive pulmonary disease with (acute) exacerbation: Secondary | ICD-10-CM | POA: Diagnosis not present

## 2022-03-04 DIAGNOSIS — J479 Bronchiectasis, uncomplicated: Secondary | ICD-10-CM | POA: Insufficient documentation

## 2022-03-04 MED ORDER — PREDNISONE 10 MG PO TABS: ORAL_TABLET | ORAL | 0 refills | Status: DC

## 2022-03-04 MED ORDER — DOXYCYCLINE HYCLATE 100 MG PO TABS: 100.0000 mg | ORAL_TABLET | Freq: Two times a day (BID) | ORAL | 0 refills | Status: DC

## 2022-03-04 NOTE — Progress Notes (Signed)
$'@Patient's$  ID: [name], female    DOB: Mar 09, 1934, 88 y.o.   MRN: [id number]  Chief Complaint  Patient presents with   Acute Visit    Cough, yellow mucus, chest congestion, ears clogged and pain, SOB.    Referring provider: [name], MD  HPI: 88 year old female, never smoker followed for severe obstructive lung disease and bronchiectasis associated with sarcoidosis.  She has patient Dr. [name] and last seen in office 10/22/2021 by Belenda Cruise NP.  Past medical history significant for chronic rhinitis, GERD, A-fib on Eliquis, CHF.  TEST/EVENTS:  11/27/2013 PFTs: FVC 73, FEV1 52, ratio 54, TLC 86, DLCO 41 08/08/2021 echocardiogram: EF 55 to 60%.  G2 DD.  RV size and function is normal.  Normal PASP.  There is mild MR.  TR is mild to moderate. 08/10/2021 CT chest without contrast: Atherosclerosis.  Calcified mediastinal lymph nodes are noted most consistent with prior granulomatous disease.  There is a small sliding-type hiatal hernia.  There is a very small left-sided pleural effusion.  Stable right apical and basilar scarring is noted as well.  10/07/2021 CXR 2 view: stable chronic lung disease/emphysema.   08/17/2021: OV with Dr. [name].  Hospitalized earlier this month for chest discomfort superimposed on chronic progressive dyspnea with a mild troponin elevation.  She underwent left heart catheterization that showed stable significant CAD (unchanged).  She also is a history of paroxysmal SVT, unclear whether this was a factor during this admission.  There is no evidence of a COPD exacerbation.  A CT was performed which showed some calcified mediastinal adenopathy consistent with granulomatous disease, small right apical and basilar scarring without any evidence of pneumonia.  There is also a small left pleural effusion and hiatal hernia.  Functional capacity and exertional shortness of breath have both progressively worsened over the last 6 months.  She has been on Symbicort; recommended  step up to [place].  10/07/2021: OV with Shari Natt NP for follow-up.  She did get some benefit from step up to [facility] after her last visit.  However, they report that over the last week or so, she has had some increased shortness of breath with exertion.  Gets winded walking any sort of distance.  They do not feel like her activity tolerance is what it used to be.  She has an occasional cough, which was slightly increased yesterday but today seems more so back to her baseline.  Nonproductive.  Her great granddaughter did recently get diagnosed with croup.  They are unsure if this is related.  Nasal congestion and drainage stable.  No significant wheezing, chest tightness.  She does not have any hemoptysis.  Denies any fevers, night sweats, chills, increased fatigue, lower extremity swelling, palpitations, orthopnea.  Using rescue a few times a week. She has had a progressive decline in her respiratory status over the last 6 months.  CT chest from June was stable.  She was started on Breztri which she did get some benefit from.  Seems that she could be having an acute exacerbation of her COPD related to exposure to viral infection.  We will treat her with prednisone burst to see if she has any perceived benefit from this.  Recommended that she continue triple therapy and as needed albuterol.  CXR today to rule out superimposed infection.  10/22/2021: OV with Jordani Nunn NP for follow-up after being treated for potential AECOPD versus progression of disease.  She reports that she feels relatively unchanged.  She actually ended up never taking the  prednisone.  She took 1 dose and then forgot to take the rest of them.  Her daughter had gone out of town with her husband and had left the medicine out but the patient did not realize that she was post to be taking it.  She does tend to have some forgetfulness.  Does state that she never forgets to take her inhaler and her other medicines are in her pillbox.  She did not notice any  significant benefit from the 1 dose that she did take.  Her breathing overall feels the same.  She gets winded with minimal activity, which has been her baseline for about the past 6 months.  Previous imaging did not show any evidence of progression of disease.  She has an occasional nonproductive cough , which is her baseline.  She denies any significant chest congestion or wheezing.  Denies any fevers, chills, skin lesions, vision changes, hemoptysis, weight loss, anorexia, orthopnea, lower extremity swelling.  She continues on [employer] inhaler.  Does not have a nebulizer machine.  She does use albuterol daily.  Walking oximetry at previous visit without desaturations.  03/04/2022: Today - acute Patient presents today for acute visit with her daughter. She started feeling bad over the past few days. She developed nasal congestion and drainage with yellow mucus. She had a cough that was productive with yellow sputum. She was also feeling more fatigued. Then today she started having increased shortness of breath, chest congestion, and wheezing. She has ear pain/pressure that is new, worse on the left side. No fevers, chills, hemoptysis. Eating and drinking well. She was with her great grandchild on Sunday, who became sick earlier this week and has pneumonia. They were told she was negative for flu, RSV, and COVID. They would like to see if she could be tested for this. She is still using her Breztri inhaler. Taking mucinex at home.   Allergies  Allergen Reactions   Levaquin [Levofloxacin] Other (See Comments)    Joint pain.. Per doctor not to take again   Oxycodone Nausea Only    Can take oxycodone with Zofran Other reaction(s): vomiting   Sulfonamide Derivatives Other (See Comments)    Possibly caused hepatitis   Sulfa Antibiotics Other (See Comments)    Possibly caused hepatitis   Morphine Nausea Only    Immunization History  Administered Date(s) Administered   Fluad Quad(high Dose 65+)  11/19/2019   Influenza Inj Mdck Quad Pf 11/05/2018   Influenza Split 10/28/2008, 11/23/2009, 11/17/2010, 11/29/2011, 10/18/2012, 11/18/2013   Influenza, High Dose Seasonal PF 11/02/2016, 11/04/2017, 02/22/2022   Influenza, Quadrivalent, Recombinant, Inj, Pf 11/04/2017, 11/29/2018   Influenza,inj,Quad PF,6+ Mos 11/18/2013, 11/12/2014   Influenza,inj,quad, With Preservative 11/26/2018, 12/24/2019   Influenza-Unspecified 11/05/2013, 11/06/2014, 12/07/2015   PFIZER(Purple Top)SARS-COV-2 Vaccination 03/14/2019, 04/04/2019, 12/12/2019   Pneumococcal Conjugate-13 02/25/2014   Pneumococcal Polysaccharide-23 05/03/2001   Pneumococcal-Unspecified 05/03/2001, 02/03/2016   Td 10/18/2011   Tdap 01/02/2012, 01/14/2012   Zoster, Live 10/18/2012, 11/08/2013    Past Medical History:  Diagnosis Date   Acute maxillary sinusitis 10/17/2017   Acute renal failure syndrome ([place]) 01/04/2019   Allergy    SEASONAL   Anemia    Arthritis    Asthma    "slight"    Baker's cyst of knee, right    CAD (coronary artery disease)    a. Myoview 10/15 - normal EF 70% // Myoview 11/16: EF 75%, normal perfusion, low risk // c. LHC 3/17 - LAD irregs, oLCx 70 (neg FFR), mRCA 30, EF  55-65% >> med Rx // Myoview 07/2018:  EF 73, extracardiac uptake, no significant ischemia (reviewed with Dr. Burt Knack), Low Risk // Myoview 6/22: EF 72, normal perfusion, low risk   Carotid stenosis    a. Carotid US 9/15 - Bilateral 1-39% ICA >> FU 2 years // b. Bilateral ICA 1-39 >> FU prn // Carotid US 06/2018: bilat 1-39; fu prn   Dyspnea    due to Sarcoidosis. When is windy or humed   Dysrhythmia    fast heart rate   Elbow fracture 04/2017   Right, had surgery   Esophageal reflux    Fracture of inferior pubic ramus ([place]) 02/28/2019   Hematochezia    Hip fracture ([place]) 12/12/2018   History of echocardiogram    a. Echo 10/15 - EF 60-65%, no RWMA   HLD (hyperlipidemia)    Hx of colonoscopy    Macular pucker, right eye    will have removed  on 01/26/21   Osteoporosis    Other chronic pulmonary heart diseases    Paroxysmal supraventricular tachycardia    Pneumonia    Pneumonia 09/18/2019   Pre-op evaluation 06/11/2019   Pyelonephritis 07/29/2019   Sacral insufficiency fracture 12/12/2018   Sarcoidosis    Sepsis ([place]) 07/30/2019   Sepsis due to Escherichia coli (e. coli) ([place]) 07/29/2019   Sepsis due to urinary tract infection ([place]) 07/18/2019    Tobacco History: Social History   Tobacco Use  Smoking Status Never  Smokeless Tobacco Never   Counseling given: Not Answered   Outpatient Medications Prior to Visit  Medication Sig Dispense Refill   Budeson-Glycopyrrol-Formoterol (BREZTRI AEROSPHERE) 160-9-4.8 MCG/ACT AERO Inhale 2 puffs into the lungs in the morning and at bedtime. 10.7 g 5   VENTOLIN HFA 108 (90 Base) MCG/ACT inhaler INHALE 2 PUFFS INTO THE LUNGS EVERY 4 HOURS AS NEEDED FOR WHEEZING OR SHORTNESS OF BREATH 18 g 2   acetaminophen (TYLENOL) 500 MG tablet Take 1,000 mg by mouth every 6 (six) hours as needed for mild pain.      aspirin 81 MG chewable tablet Chew 81 mg by mouth daily.     Calcium Carbonate Antacid (TUMS ULTRA 1000 PO) Take 2,000-3,000 mg by mouth at bedtime as needed (acid reflux/indigestion/heartburn).     denosumab (PROLIA) 60 MG/ML SOLN injection Inject 60 mg into the skin every 6 (six) months. Administer in upper arm, thigh, or abdomen     diltiazem (CARDIZEM CD) 120 MG 24 hr capsule Take 1 capsule (120 mg total) by mouth daily. 90 capsule 3   ELIQUIS 2.5 MG TABS tablet TAKE 1 TABLET(2.5 MG) BY MOUTH TWICE DAILY 180 tablet 1   ipratropium-albuterol (DUONEB) 0.5-2.5 (3) MG/3ML SOLN Take 3 mLs by nebulization every 6 (six) hours as needed (wheezing; shortness of breath). (Patient not taking: Reported on 03/04/2022) 75 mL 5   metoprolol tartrate (LOPRESSOR) 25 MG tablet Take 1 tablet (25 mg total) by mouth daily as needed (palpitations). 90 tablet 3   nitroGLYCERIN (NITROSTAT) 0.4 MG SL tablet Place 1  tablet (0.4 mg total) under the tongue every 5 (five) minutes as needed for chest pain. 25 tablet 3   omeprazole (PRILOSEC) 40 MG capsule Take 40 mg by mouth 2 (two) times daily.      oxybutynin (DITROPAN) 5 MG tablet Take 5 mg by mouth at bedtime.     sertraline (ZOLOFT) 50 MG tablet Take 1 tablet (50 mg total) by mouth at bedtime. 30 tablet 1   traMADol (ULTRAM) 50 MG tablet Take  50 mg by mouth every 8 (eight) hours as needed (pain).     Vibegron (GEMTESA) 75 MG TABS Take 75 mg by mouth at bedtime.     amoxicillin (AMOXIL) 875 MG tablet 1 tablet Orally Twice a day with food for 10 days     No facility-administered medications prior to visit.     Review of Systems:   Constitutional: No weight loss or gain, night sweats, fevers, chills, or lassitude. + Fatigue, increased HEENT: No headaches, difficulty swallowing, tooth/dental problems, or sore throat. No sneezing, itching. +nasal congestion/purulent drainage, ear pressure/pain CV:  No chest pain, orthopnea, PND, swelling in lower extremities, anasarca, dizziness, palpitations, syncope Resp: +shortness of breath with exertion; productive cough; wheezing. No hemoptysis.  No chest wall deformity GI:  No heartburn, indigestion, abdominal pain, nausea, vomiting, diarrhea, change in bowel habits, loss of appetite, bloody stools.  GU: No dysuria, change in color of urine, urgency or frequency.  Skin: No rash, lesions, ulcerations MSK:  No joint pain or swelling.   Neuro: No dizziness or lightheadedness.  Psych: No depression or anxiety. Mood stable.     Physical Exam:  BP 100/70 (BP Location: Left Arm)   Pulse 63   Ht '5\' 3"'$  (1.6 m)   Wt 127 lb 6.4 oz (57.8 kg)   LMP 02/22/1983 (Approximate)   SpO2 96%   BMI 22.57 kg/m   GEN: Pleasant, interactive, well-kempt; elderly; in no acute distress. HEENT:  Normocephalic and atraumatic. PERRLA. EACs patent bilaterally. TM pearly gray with present light reflex. Sclera white. Nasal turbinates  erythematous, moist and patent bilaterally. Clear rhinorrhea present. Oropharynx pink and moist, without exudate or edema. Sinus tenderness frontal. No lesions, ulcerations, or postnasal drip.  NECK:  Supple w/ fair ROM. No JVD present. Normal carotid impulses w/o bruits. Thyroid symmetrical with no goiter or nodules palpated. No lymphadenopathy.   CV: RRR, no m/r/g, no peripheral edema. Pulses intact, +2 bilaterally. No cyanosis, pallor or clubbing. PULMONARY:  Unlabored, regular breathing. Scattered rhonchi with expiratory wheezes bases bilaterally A&P. No accessory muscle use. No dullness to percussion. GI: BS present and normoactive. Soft, non-tender to palpation. No organomegaly or masses detected.  MSK: No erythema, warmth or tenderness. Cap refil <2 sec all extrem. No deformities or joint swelling noted.  Neuro: A/Ox3. No focal deficits noted.   Skin: Warm, no lesions or rashe Psych: Normal affect and behavior. Judgement and thought content appropriate.     Lab Results:  CBC    Component Value Date/Time   WBC 9.4 08/10/2021 0242   RBC 3.61 (L) 08/10/2021 0242   HGB 10.6 (L) 08/10/2021 0242   HGB 12.7 07/21/2020 1315   HCT 34.8 (L) 08/10/2021 0242   HCT 38.4 07/21/2020 1315   PLT 230 08/10/2021 0242   PLT 309 07/21/2020 1315   MCV 96.4 08/10/2021 0242   MCV 94 07/21/2020 1315   MCH 29.4 08/10/2021 0242   MCHC 30.5 08/10/2021 0242   RDW 14.6 08/10/2021 0242   RDW 12.7 07/21/2020 1315   LYMPHSABS 1.7 08/10/2021 0242   MONOABS 0.8 08/10/2021 0242   EOSABS 0.2 08/10/2021 0242   BASOSABS 0.0 08/10/2021 0242    BMET    Component Value Date/Time   NA 133 (L) 08/10/2021 0242   NA 140 08/26/2020 0916   K 3.7 08/10/2021 0242   CL 105 08/10/2021 0242   CO2 21 (L) 08/10/2021 0242   GLUCOSE 84 08/10/2021 0242   BUN 18 08/10/2021 0242   BUN 21 08/26/2020 0916  CREATININE 0.96 08/10/2021 0242   CREATININE 0.85 05/18/2015 1028   CALCIUM 8.1 (L) 08/10/2021 0242   GFRNONAA 58  (L) 08/10/2021 0242   GFRAA 78 11/26/2019 1234    BNP    Component Value Date/Time   BNP 387.3 (H) 08/07/2021 1900     Imaging:  No results found.       Latest Ref Rng & Units 11/27/2013    3:33 PM  PFT Results  FVC-Pre L 1.89   FVC-Predicted Pre % 73   FVC-Post L 2.01   FVC-Predicted Post % 77   Pre FEV1/FVC % % 54   Post FEV1/FCV % % 54   FEV1-Pre L 1.02   FEV1-Predicted Pre % 52   FEV1-Post L 1.09   DLCO uncorrected ml/min/mmHg 9.47   DLCO UNC% % 41   DLCO corrected ml/min/mmHg 9.47   DLCO COR %Predicted % 41   DLVA Predicted % 69   TLC L 4.22   TLC % Predicted % 86   RV % Predicted % 95     No results found for: "NITRICOXIDE"      Assessment & Plan:   Bronchiectasis with acute exacerbation (HCC) Bronchiectatic/COPD exacerbation secondary to viral illness. Non-toxic appearing and VS stable in office. Bronchospasm improved with neb treatment in office. Rapid COVID testing negative. PCR for COVID/influenza/RSV sent. We will treat her with steroid taper and empiric doxycycline 7 day course. Supportive care measures advised. CXR to rule out superimposed infection. Strict return precautions.   Patient Instructions  Continue Breztri 2 puffs Twice daily. Brush tongue and rinse mouth afterwards Continue Albuterol inhaler 2 puffs or 3 mL neb every 6 hours as needed for shortness of breath or wheezing. Notify if symptoms persist despite rescue inhaler/neb use. Use your breathing treatment at least twice a day until symptoms improve  -Continue guaifenesin (mucinex) over the counter as directed  -Saline nasal rinses 1-2 times a day for nasal congestion. Flonase nasal spray 2 sprays each nostril daily until sinus congestion improves -Can use over the counter afrin nasal spray as needed for nasal congestion/sinus pressure/ear pressure but do not use longer than 3 days -Prednisone taper. 4 tabs for 2 days, then 3 tabs for 2 days, 2 tabs for 2 days, then 1 tab for 2 days,  then stop. Take in AM with food -Doxycycline 1 tab Twice daily for 7 days. Take with food. Wear sunscreen if you outside as this medication can increase risk for sunburns  Chest x ray today  RSV/COVID/flu testing   Follow up in 2 weeks with Dr [name] or Joellen Jersey Shamaya Kauer,NP.  If symptoms do not improve or worsen, please contact office for sooner follow up or seek emergency care.    COPD (chronic obstructive pulmonary disease) ([place]) See above  URI (upper respiratory infection) Viral URI. Suspect her ear pain is related to sinusitis. No evidence of infection/inflammation on exam. See above plan.     I spent 35 minutes of dedicated to the care of this patient on the date of this encounter to include pre-visit review of records, face-to-face time with the patient discussing conditions above, post visit ordering of testing, clinical documentation with the electronic health record, making appropriate referrals as documented, and communicating necessary findings to members of the patients care team.  Clayton Bibles, NP 03/04/2022  Pt aware and understands NP's role.

## 2022-03-04 NOTE — Patient Instructions (Signed)
Continue Breztri 2 puffs Twice daily. Brush tongue and rinse mouth afterwards Continue Albuterol inhaler 2 puffs or 3 mL neb every 6 hours as needed for shortness of breath or wheezing. Notify if symptoms persist despite rescue inhaler/neb use. Use your breathing treatment at least twice a day until symptoms improve  -Continue guaifenesin (mucinex) over the counter as directed  -Saline nasal rinses 1-2 times a day for nasal congestion. Flonase nasal spray 2 sprays each nostril daily until sinus congestion improves -Can use over the counter afrin nasal spray as needed for nasal congestion/sinus pressure/ear pressure but do not use longer than 3 days -Prednisone taper. 4 tabs for 2 days, then 3 tabs for 2 days, 2 tabs for 2 days, then 1 tab for 2 days, then stop. Take in AM with food -Doxycycline 1 tab Twice daily for 7 days. Take with food. Wear sunscreen if you outside as this medication can increase risk for sunburns  Chest x ray today  RSV/COVID/flu testing   Follow up in 2 weeks with Dr [name] or Joellen Jersey Briane Birden,NP.  If symptoms do not improve or worsen, please contact office for sooner follow up or seek emergency care.

## 2022-03-04 NOTE — Assessment & Plan Note (Signed)
See above

## 2022-03-04 NOTE — Telephone Encounter (Signed)
Called and spoke with patient's daughter [name]. She stated that the patient has been SOB and wheezing more for the past week. She woke up this morning with bilateral ear pain. She was able to get an appt for next week but she wanted to see if she could be seen today. I offered an appt with Katie this afternoon at 2pm and she accepted.   Nothing further needed at time of call.

## 2022-03-04 NOTE — Assessment & Plan Note (Signed)
Bronchiectatic/COPD exacerbation secondary to viral illness. Non-toxic appearing and VS stable in office. Bronchospasm improved with neb treatment in office. Rapid COVID testing negative. PCR for COVID/influenza/RSV sent. We will treat her with steroid taper and empiric doxycycline 7 day course. Supportive care measures advised. CXR to rule out superimposed infection. Strict return precautions.   Patient Instructions  Continue Breztri 2 puffs Twice daily. Brush tongue and rinse mouth afterwards Continue Albuterol inhaler 2 puffs or 3 mL neb every 6 hours as needed for shortness of breath or wheezing. Notify if symptoms persist despite rescue inhaler/neb use. Use your breathing treatment at least twice a day until symptoms improve  -Continue guaifenesin (mucinex) over the counter as directed  -Saline nasal rinses 1-2 times a day for nasal congestion. Flonase nasal spray 2 sprays each nostril daily until sinus congestion improves -Can use over the counter afrin nasal spray as needed for nasal congestion/sinus pressure/ear pressure but do not use longer than 3 days -Prednisone taper. 4 tabs for 2 days, then 3 tabs for 2 days, 2 tabs for 2 days, then 1 tab for 2 days, then stop. Take in AM with food -Doxycycline 1 tab Twice daily for 7 days. Take with food. Wear sunscreen if you outside as this medication can increase risk for sunburns  Chest x ray today  RSV/COVID/flu testing   Follow up in 2 weeks with Dr [name] or Joellen Jersey Dierks Wach,NP.  If symptoms do not improve or worsen, please contact office for sooner follow up or seek emergency care.

## 2022-03-04 NOTE — Telephone Encounter (Signed)
Has appt next week. Wanted to be seen today as acute but no appts. Pls call to advise if we can send in something or squeeze her in. TY.   Pharm: [facility]

## 2022-03-04 NOTE — Assessment & Plan Note (Signed)
Viral URI. Suspect her ear pain is related to sinusitis. No evidence of infection/inflammation on exam. See above plan.

## 2022-03-06 LAB — COVID-19, FLU A+B AND RSV
Influenza A, NAA: NOT DETECTED
Influenza B, NAA: NOT DETECTED
RSV, NAA: NOT DETECTED
SARS-CoV-2, NAA: NOT DETECTED

## 2022-03-07 NOTE — Progress Notes (Signed)
Flu/RSV/COVID all negative. Thanks!

## 2022-03-08 ENCOUNTER — Telehealth: Payer: Self-pay | Admitting: Emergency Medicine

## 2022-03-08 NOTE — Telephone Encounter (Signed)
Called daughter and she is wanting her mother to be seen sooner. And I looked at scheduled at [facility] is the only one that has openings today. But daughter wanted me to ask [facility] professional opinion first.  TP I am fully aware that this is not your patient. But daughter states that her mom is lethargic, wheezing and not eating since starting Bactrim on Friday. I suggested ED to the daughter but the daughter wants your NP opinion first.   I told daughter that you have openings today at 2,2:30 and 3pm today. But she wants to talk to you first. Patient has office visit with Dr [name] tomorrow at [place].  Please advise [facility]

## 2022-03-08 NOTE — Telephone Encounter (Signed)
Spoke with the pt's daughter and notified of response per Tammy  She appreciated everything, but pt wishes to keep ov with RB tomorrow at 4 pm  She will seek emergent care should her symptoms persist/worsen in the meantime   I clarified the abx she is taking- it's not bactrim, she is actually taking macrobid for possible UTI. She has taken this several times in the past.   Will forward to Dr [name] as Juluis Rainier.

## 2022-03-08 NOTE — Telephone Encounter (Signed)
Patient's daughter [name]) called to ask if her mother should be seen sooner than her appt. Tomorrow at 4pm.  She stated that the antibiotic that was prescribed on this past Friday is not helping and patient is lethargic, wheezing and not eating.  Please advise and call daughter to discuss further at [phone]

## 2022-03-08 NOTE — Telephone Encounter (Signed)
I am glad to see her today.  Not sure who started her on Bactrim though Katie started her on doxycycline.  If she is not eating drinking and lethargic definitely may need admission and we cannot admit from the office  But let her know that I am glad to see her today  Please contact office for sooner follow up if symptoms do not improve or worsen or seek emergency care   This is Dr. [name]  patient so will send to him for [facility]

## 2022-03-08 NOTE — Telephone Encounter (Signed)
Thank you :)

## 2022-03-09 ENCOUNTER — Emergency Department (HOSPITAL_COMMUNITY): Payer: Medicare Other

## 2022-03-09 ENCOUNTER — Inpatient Hospital Stay (HOSPITAL_COMMUNITY)
Admission: EM | Admit: 2022-03-09 | Discharge: 2022-03-18 | DRG: 190 | Disposition: A | Discharge status: Skilled Nursing Facility | Payer: Medicare Other | Attending: Internal Medicine | Admitting: Internal Medicine

## 2022-03-09 ENCOUNTER — Ambulatory Visit: Payer: Medicare Other | Admitting: Emergency Medicine

## 2022-03-09 ENCOUNTER — Other Ambulatory Visit: Payer: Self-pay

## 2022-03-09 ENCOUNTER — Encounter (HOSPITAL_COMMUNITY): Payer: Self-pay | Admitting: Internal Medicine

## 2022-03-09 DIAGNOSIS — Z66 Do not resuscitate: Secondary | ICD-10-CM | POA: Diagnosis present

## 2022-03-09 DIAGNOSIS — I6523 Occlusion and stenosis of bilateral carotid arteries: Secondary | ICD-10-CM

## 2022-03-09 DIAGNOSIS — M810 Age-related osteoporosis without current pathological fracture: Secondary | ICD-10-CM | POA: Diagnosis present

## 2022-03-09 DIAGNOSIS — D72829 Elevated white blood cell count, unspecified: Secondary | ICD-10-CM | POA: Diagnosis not present

## 2022-03-09 DIAGNOSIS — B338 Other specified viral diseases: Secondary | ICD-10-CM

## 2022-03-09 DIAGNOSIS — I110 Hypertensive heart disease with heart failure: Secondary | ICD-10-CM | POA: Diagnosis present

## 2022-03-09 DIAGNOSIS — J440 Chronic obstructive pulmonary disease with acute lower respiratory infection: Secondary | ICD-10-CM | POA: Diagnosis present

## 2022-03-09 DIAGNOSIS — I5032 Chronic diastolic (congestive) heart failure: Secondary | ICD-10-CM | POA: Diagnosis present

## 2022-03-09 DIAGNOSIS — F0390 Unspecified dementia without behavioral disturbance: Secondary | ICD-10-CM | POA: Diagnosis present

## 2022-03-09 DIAGNOSIS — E785 Hyperlipidemia, unspecified: Secondary | ICD-10-CM | POA: Diagnosis present

## 2022-03-09 DIAGNOSIS — I959 Hypotension, unspecified: Secondary | ICD-10-CM | POA: Diagnosis present

## 2022-03-09 DIAGNOSIS — J210 Acute bronchiolitis due to respiratory syncytial virus: Secondary | ICD-10-CM

## 2022-03-09 DIAGNOSIS — R0603 Acute respiratory distress: Principal | ICD-10-CM

## 2022-03-09 DIAGNOSIS — Z801 Family history of malignant neoplasm of trachea, bronchus and lung: Secondary | ICD-10-CM

## 2022-03-09 DIAGNOSIS — I2510 Atherosclerotic heart disease of native coronary artery without angina pectoris: Secondary | ICD-10-CM | POA: Diagnosis present

## 2022-03-09 DIAGNOSIS — Z79899 Other long term (current) drug therapy: Secondary | ICD-10-CM | POA: Diagnosis not present

## 2022-03-09 DIAGNOSIS — J9601 Acute respiratory failure with hypoxia: Secondary | ICD-10-CM | POA: Diagnosis present

## 2022-03-09 DIAGNOSIS — J205 Acute bronchitis due to respiratory syncytial virus: Secondary | ICD-10-CM | POA: Diagnosis present

## 2022-03-09 DIAGNOSIS — J441 Chronic obstructive pulmonary disease with (acute) exacerbation: Secondary | ICD-10-CM | POA: Diagnosis present

## 2022-03-09 DIAGNOSIS — I480 Paroxysmal atrial fibrillation: Secondary | ICD-10-CM

## 2022-03-09 DIAGNOSIS — J471 Bronchiectasis with (acute) exacerbation: Secondary | ICD-10-CM | POA: Diagnosis not present

## 2022-03-09 DIAGNOSIS — Z7901 Long term (current) use of anticoagulants: Secondary | ICD-10-CM | POA: Diagnosis not present

## 2022-03-09 DIAGNOSIS — Z7982 Long term (current) use of aspirin: Secondary | ICD-10-CM | POA: Diagnosis not present

## 2022-03-09 DIAGNOSIS — I471 Supraventricular tachycardia, unspecified: Secondary | ICD-10-CM | POA: Diagnosis present

## 2022-03-09 DIAGNOSIS — D649 Anemia, unspecified: Secondary | ICD-10-CM | POA: Diagnosis present

## 2022-03-09 DIAGNOSIS — Z96612 Presence of left artificial shoulder joint: Secondary | ICD-10-CM | POA: Diagnosis present

## 2022-03-09 DIAGNOSIS — M419 Scoliosis, unspecified: Secondary | ICD-10-CM | POA: Diagnosis present

## 2022-03-09 DIAGNOSIS — I779 Disorder of arteries and arterioles, unspecified: Secondary | ICD-10-CM | POA: Diagnosis present

## 2022-03-09 DIAGNOSIS — K219 Gastro-esophageal reflux disease without esophagitis: Secondary | ICD-10-CM | POA: Diagnosis present

## 2022-03-09 DIAGNOSIS — J121 Respiratory syncytial virus pneumonia: Secondary | ICD-10-CM | POA: Diagnosis not present

## 2022-03-09 DIAGNOSIS — Z800 Family history of malignant neoplasm of digestive organs: Secondary | ICD-10-CM

## 2022-03-09 DIAGNOSIS — I10 Essential (primary) hypertension: Secondary | ICD-10-CM | POA: Diagnosis not present

## 2022-03-09 DIAGNOSIS — M545 Low back pain, unspecified: Secondary | ICD-10-CM | POA: Diagnosis present

## 2022-03-09 DIAGNOSIS — Z1152 Encounter for screening for COVID-19: Secondary | ICD-10-CM | POA: Diagnosis not present

## 2022-03-09 DIAGNOSIS — D860 Sarcoidosis of lung: Secondary | ICD-10-CM | POA: Diagnosis present

## 2022-03-09 DIAGNOSIS — I071 Rheumatic tricuspid insufficiency: Secondary | ICD-10-CM | POA: Diagnosis present

## 2022-03-09 DIAGNOSIS — I4891 Unspecified atrial fibrillation: Secondary | ICD-10-CM | POA: Diagnosis not present

## 2022-03-09 DIAGNOSIS — Z8249 Family history of ischemic heart disease and other diseases of the circulatory system: Secondary | ICD-10-CM

## 2022-03-09 DIAGNOSIS — R3915 Urgency of urination: Secondary | ICD-10-CM | POA: Diagnosis present

## 2022-03-09 DIAGNOSIS — D6869 Other thrombophilia: Secondary | ICD-10-CM | POA: Diagnosis present

## 2022-03-09 DIAGNOSIS — D509 Iron deficiency anemia, unspecified: Secondary | ICD-10-CM | POA: Diagnosis present

## 2022-03-09 DIAGNOSIS — J479 Bronchiectasis, uncomplicated: Secondary | ICD-10-CM | POA: Diagnosis present

## 2022-03-09 LAB — BRAIN NATRIURETIC PEPTIDE: B Natriuretic Peptide: 254.2 pg/mL — ABNORMAL HIGH (ref 0.0–100.0)

## 2022-03-09 LAB — COMPREHENSIVE METABOLIC PANEL
ALT: 15 U/L (ref 0–44)
AST: 24 U/L (ref 15–41)
Albumin: 3.3 g/dL — ABNORMAL LOW (ref 3.5–5.0)
Alkaline Phosphatase: 78 U/L (ref 38–126)
Anion gap: 13 (ref 5–15)
BUN: 26 mg/dL — ABNORMAL HIGH (ref 8–23)
CO2: 19 mmol/L — ABNORMAL LOW (ref 22–32)
Calcium: 8.8 mg/dL — ABNORMAL LOW (ref 8.9–10.3)
Chloride: 103 mmol/L (ref 98–111)
Creatinine, Ser: 1.1 mg/dL — ABNORMAL HIGH (ref 0.44–1.00)
GFR, Estimated: 49 mL/min — ABNORMAL LOW (ref >60)
Glucose, Bld: 123 mg/dL — ABNORMAL HIGH (ref 70–99)
Potassium: 3.5 mmol/L (ref 3.5–5.1)
Sodium: 135 mmol/L (ref 135–145)
Total Bilirubin: 0.5 mg/dL (ref 0.3–1.2)
Total Protein: 5.9 g/dL — ABNORMAL LOW (ref 6.5–8.1)

## 2022-03-09 LAB — CBC WITH DIFFERENTIAL/PLATELET
Abs Immature Granulocytes: 0.42 10*3/uL — ABNORMAL HIGH (ref 0.00–0.07)
Basophils Absolute: 0 10*3/uL (ref 0.0–0.1)
Basophils Relative: 0 %
Eosinophils Absolute: 0 10*3/uL (ref 0.0–0.5)
Eosinophils Relative: 0 %
HCT: 42.7 % (ref 36.0–46.0)
Hemoglobin: 13.6 g/dL (ref 12.0–15.0)
Immature Granulocytes: 3 %
Lymphocytes Relative: 15 %
Lymphs Abs: 2.2 10*3/uL (ref 0.7–4.0)
MCH: 27.9 pg (ref 26.0–34.0)
MCHC: 31.9 g/dL (ref 30.0–36.0)
MCV: 87.7 fL (ref 80.0–100.0)
Monocytes Absolute: 1.2 10*3/uL — ABNORMAL HIGH (ref 0.1–1.0)
Monocytes Relative: 8 %
Neutro Abs: 11.5 10*3/uL — ABNORMAL HIGH (ref 1.7–7.7)
Neutrophils Relative %: 74 %
Platelets: 279 10*3/uL (ref 150–400)
RBC: 4.87 MIL/uL (ref 3.87–5.11)
RDW: 16.4 % — ABNORMAL HIGH (ref 11.5–15.5)
WBC: 15.5 10*3/uL — ABNORMAL HIGH (ref 4.0–10.5)
nRBC: 0 % (ref 0.0–0.2)

## 2022-03-09 LAB — RESP PANEL BY RT-PCR (RSV, FLU A&B, COVID)  RVPGX2
Influenza A by PCR: NEGATIVE
Influenza B by PCR: NEGATIVE
Resp Syncytial Virus by PCR: POSITIVE — AB
SARS Coronavirus 2 by RT PCR: NEGATIVE

## 2022-03-09 LAB — TSH: TSH: 1.031 u[IU]/mL (ref 0.350–4.500)

## 2022-03-09 MED ORDER — LACTATED RINGERS IV BOLUS
1000.0000 mL | Freq: Once | INTRAVENOUS | Status: AC
  Administered 2022-03-09: 1000 mL via INTRAVENOUS

## 2022-03-09 MED ORDER — ALBUTEROL SULFATE (2.5 MG/3ML) 0.083% IN NEBU: 2.5000 mg | INHALATION_SOLUTION | RESPIRATORY_TRACT | Status: DC | PRN

## 2022-03-09 MED ORDER — METOPROLOL TARTRATE 25 MG PO TABS
25.0000 mg | ORAL_TABLET | Freq: Four times a day (QID) | ORAL | Status: DC
  Filled 2022-03-09: qty 1

## 2022-03-09 MED ORDER — IPRATROPIUM BROMIDE 0.02 % IN SOLN
0.5000 mg | Freq: Four times a day (QID) | RESPIRATORY_TRACT | Status: DC
  Administered 2022-03-09 – 2022-03-11 (×7): 0.5 mg via RESPIRATORY_TRACT
  Filled 2022-03-09 (×7): qty 2.5

## 2022-03-09 MED ORDER — AMIODARONE HCL IN DEXTROSE 360-4.14 MG/200ML-% IV SOLN
30.0000 mg/h | INTRAVENOUS | Status: DC
  Administered 2022-03-10 (×3): 30 mg/h via INTRAVENOUS
  Filled 2022-03-09 (×2): qty 200

## 2022-03-09 MED ORDER — PANTOPRAZOLE SODIUM 40 MG PO TBEC
40.0000 mg | DELAYED_RELEASE_TABLET | Freq: Every day | ORAL | Status: DC
  Administered 2022-03-10 – 2022-03-16 (×7): 40 mg via ORAL
  Filled 2022-03-09 (×7): qty 1

## 2022-03-09 MED ORDER — DILTIAZEM LOAD VIA INFUSION
20.0000 mg | Freq: Once | INTRAVENOUS | Status: AC
  Administered 2022-03-09: 20 mg via INTRAVENOUS
  Filled 2022-03-09: qty 20

## 2022-03-09 MED ORDER — DILTIAZEM HCL-DEXTROSE 125-5 MG/125ML-% IV SOLN (PREMIX)
5.0000 mg/h | INTRAVENOUS | Status: DC
  Administered 2022-03-09: 5 mg/h via INTRAVENOUS
  Filled 2022-03-09: qty 125

## 2022-03-09 MED ORDER — SODIUM CHLORIDE 0.9% FLUSH
3.0000 mL | Freq: Two times a day (BID) | INTRAVENOUS | Status: DC
  Administered 2022-03-10 – 2022-03-18 (×16): 3 mL via INTRAVENOUS

## 2022-03-09 MED ORDER — FLUTICASONE FUROATE-VILANTEROL 100-25 MCG/ACT IN AEPB
1.0000 | INHALATION_SPRAY | Freq: Every day | RESPIRATORY_TRACT | Status: DC
  Administered 2022-03-09 – 2022-03-14 (×3): 1 via RESPIRATORY_TRACT
  Filled 2022-03-09 (×2): qty 28

## 2022-03-09 MED ORDER — UMECLIDINIUM BROMIDE 62.5 MCG/ACT IN AEPB
1.0000 | INHALATION_SPRAY | Freq: Every day | RESPIRATORY_TRACT | Status: DC
  Administered 2022-03-09 – 2022-03-14 (×3): 1 via RESPIRATORY_TRACT
  Filled 2022-03-09 (×2): qty 7

## 2022-03-09 MED ORDER — ASPIRIN 81 MG PO CHEW
81.0000 mg | CHEWABLE_TABLET | Freq: Every day | ORAL | Status: DC
  Administered 2022-03-10 – 2022-03-18 (×9): 81 mg via ORAL
  Filled 2022-03-09 (×9): qty 1

## 2022-03-09 MED ORDER — DIGOXIN 125 MCG PO TABS
0.1250 mg | ORAL_TABLET | Freq: Every day | ORAL | Status: DC
  Filled 2022-03-09: qty 1

## 2022-03-09 MED ORDER — AMIODARONE LOAD VIA INFUSION
150.0000 mg | Freq: Once | INTRAVENOUS | Status: AC
  Administered 2022-03-09: 150 mg via INTRAVENOUS
  Filled 2022-03-09: qty 83.34

## 2022-03-09 MED ORDER — BUDESON-GLYCOPYRROL-FORMOTEROL 160-9-4.8 MCG/ACT IN AERO: 2.0000 | INHALATION_SPRAY | Freq: Two times a day (BID) | RESPIRATORY_TRACT | Status: DC

## 2022-03-09 MED ORDER — METOPROLOL TARTRATE 5 MG/5ML IV SOLN
5.0000 mg | Freq: Once | INTRAVENOUS | Status: AC
  Administered 2022-03-09: 5 mg via INTRAVENOUS
  Filled 2022-03-09: qty 5

## 2022-03-09 MED ORDER — APIXABAN 2.5 MG PO TABS
2.5000 mg | ORAL_TABLET | Freq: Two times a day (BID) | ORAL | Status: DC
  Administered 2022-03-09 – 2022-03-18 (×18): 2.5 mg via ORAL
  Filled 2022-03-09 (×18): qty 1

## 2022-03-09 MED ORDER — POLYETHYLENE GLYCOL 3350 17 G PO PACK: 17.0000 g | PACK | Freq: Every day | ORAL | Status: DC | PRN

## 2022-03-09 MED ORDER — AMIODARONE HCL IN DEXTROSE 360-4.14 MG/200ML-% IV SOLN
60.0000 mg/h | INTRAVENOUS | Status: DC
  Administered 2022-03-09 (×2): 60 mg/h via INTRAVENOUS
  Filled 2022-03-09 (×2): qty 200

## 2022-03-09 MED ORDER — ALBUTEROL SULFATE (2.5 MG/3ML) 0.083% IN NEBU
10.0000 mg/h | INHALATION_SOLUTION | Freq: Once | RESPIRATORY_TRACT | Status: AC
  Administered 2022-03-09: 10 mg/h via RESPIRATORY_TRACT
  Filled 2022-03-09: qty 12

## 2022-03-09 MED ORDER — ACETAMINOPHEN 325 MG PO TABS
650.0000 mg | ORAL_TABLET | Freq: Four times a day (QID) | ORAL | Status: DC | PRN
  Administered 2022-03-16: 650 mg via ORAL
  Filled 2022-03-09: qty 2

## 2022-03-09 MED ORDER — PREDNISONE 20 MG PO TABS
40.0000 mg | ORAL_TABLET | Freq: Every day | ORAL | Status: DC
  Administered 2022-03-10 – 2022-03-12 (×3): 40 mg via ORAL
  Filled 2022-03-09 (×3): qty 2

## 2022-03-09 MED ORDER — ACETAMINOPHEN 650 MG RE SUPP: 650.0000 mg | Freq: Four times a day (QID) | RECTAL | Status: DC | PRN

## 2022-03-09 NOTE — ED Provider Notes (Signed)
  Physical Exam  BP (!) 101/54   Pulse (!) 144   Temp (!) 97.4 F (36.3 C) (Axillary)   Resp (!) 22   LMP 02/22/1983 (Approximate)   SpO2 97%   Physical Exam Cardiovascular:     Rate and Rhythm: Tachycardia present.  Pulmonary:     Effort: Tachypnea present.     Breath sounds: No stridor.  Neurological:     Mental Status: She is alert.     Procedures  .Critical Care  Performed by: Elgie Congo, MD Authorized by: Elgie Congo, MD   Critical care provider statement:    Critical care time (minutes):  30   Critical care was necessary to treat or prevent imminent or life-threatening deterioration of the following conditions:  Cardiac failure and respiratory failure   Critical care was time spent personally by me on the following activities:  Development of treatment plan with patient or surrogate, discussions with consultants, evaluation of patient's response to treatment, examination of patient, ordering and review of laboratory studies, ordering and review of radiographic studies, ordering and performing treatments and interventions, pulse oximetry, re-evaluation of patient's condition, review of old charts and obtaining history from patient or surrogate   Care discussed with: admitting provider     ED Course / MDM   Clinical Course as of 03/09/22 1846  Wed Mar 09, 2022  1510 87 F with COPD exacerbation and A fib RVR on dilt and Eliquis at home. She is RSV positive today. BNP 254. On dilt gtt s/p dilt bolus. Holding off cardioversion due to noncompliance.  Got steroids and Mg with EMS. Getting duonebs here. Follow up Cards consult and admit. [VB]  1103 I was messaged by nurse because patient's blood pressures were getting soft with SBP in the 80s, Dilt drip was discontinued, cardiology recommendations still pending.  Heart rate 170s, ordered IV fluid bolus with improvement of pressure to 109/56.  Ordered IV metoprolol push. [VB]  [id number] Cardiology has seen and evaluated  patient, they are recommending trialing scheduled metoprolol with one-time dose of digoxin.  If remaining severely tachycardic then recommending trialing amiodarone.  She has received approximately 1250 IV fluids.  I stopped her second bolus after 250 cc. HR in 130s.BP stable.  Reaching out to hospitalist for admission. [VB]  1844 S/w Dr Trilby Drummer who will admit for further workup and management of COPD exacerbation and A fib RVR. [VB]    Clinical Course User Index [VB] Elgie Congo, MD   Medical Decision Making Amount and/or Complexity of Data Reviewed Labs: ordered. Radiology: ordered.  Risk Prescription drug management. Decision regarding hospitalization.          Elgie Congo, MD 03/09/22 [phone]

## 2022-03-09 NOTE — Consult Note (Signed)
Cardiology Consultation   Patient ID: MALEY VENEZIA MRN: [id number]; DOB: January 26, 1935  Admit date: 03/09/2022 Date of Consult: 03/09/2022  PCP:  [name], [facility] Cardiologist:  Sherren Mocha, MD  Cardiology APP:  Liliane Shi, PA-C       Patient Profile:   [name] is a 88 y.o. female with a hx of CAD, paroxysmal afib, HFpEF, aortic atherosclerosis, pulmonary sarcoidosis, bronchiectasis, hypertension, hyperlipidemia, SVT, carotid stenosis, dementia who is being seen 03/09/2022 for the evaluation of afib with RVR in the setting of RSV at the request of Dr. Nechama Guard.  History of Present Illness:   [name] presented to the ED today via EMS in respiratory distress. Per patient and daughter at bedside, patient began feeling poorly last Thursday with URI like symptoms and cough. She was seen on Friday at [facility] and was placed on empiric doxycycline along with steroid taper for suspected bronchiectatic/COPD exacerbation 2/2 viral illness. CXR was notable for small pleural effusion. Patient's symptoms persisted through the weekend and on Monday she also began to have urinary urgency. Patient's daughter called PCP who prescribed Macrobid for empiric UTI coverage. Apparently shortness of breath and coughing continued to worsen yesterday with more apparent weakness. Ultimately the daughter decided to call EMS today. Patient is currently feeling short of breath in the ED. No chest pain or palpitations at this time. She also denies noticing palpitations or irregular HR over the weekend. Of note, is not the most reliable historian with dementia. It also appears that this affects medication compliance. Patient's daughter says that despite her laying out/organizing her mom's medicines, she often finds that she has not taken them. Patient's daughter says that patient missed eliquis yesterday morning and this morning.   Past Medical History:   Diagnosis Date   Acute maxillary sinusitis 10/17/2017   Acute renal failure syndrome ([place]) 01/04/2019   Allergy    SEASONAL   Anemia    Arthritis    Asthma    "slight"    Baker's cyst of knee, right    CAD (coronary artery disease)    a. Myoview 10/15 - normal EF 70% // Myoview 11/16: EF 75%, normal perfusion, low risk // c. LHC 3/17 - LAD irregs, oLCx 70 (neg FFR), mRCA 30, EF 55-65% >> med Rx // Myoview 07/2018:  EF 73, extracardiac uptake, no significant ischemia (reviewed with Dr. Burt Knack), Low Risk // Myoview 6/22: EF 72, normal perfusion, low risk   Carotid stenosis    a. Carotid US 9/15 - Bilateral 1-39% ICA >> FU 2 years // b. Bilateral ICA 1-39 >> FU prn // Carotid US 06/2018: bilat 1-39; fu prn   Dyspnea    due to Sarcoidosis. When is windy or humed   Dysrhythmia    fast heart rate   Elbow fracture 04/2017   Right, had surgery   Esophageal reflux    Fracture of inferior pubic ramus ([place]) 02/28/2019   Hematochezia    Hip fracture ([place]) 12/12/2018   History of echocardiogram    a. Echo 10/15 - EF 60-65%, no RWMA   HLD (hyperlipidemia)    Hx of colonoscopy    Macular pucker, right eye    will have removed on 01/26/21   Osteoporosis    Other chronic pulmonary heart diseases    Paroxysmal supraventricular tachycardia    Pneumonia    Pneumonia 09/18/2019   Pre-op evaluation 06/11/2019   Pyelonephritis 07/29/2019   Sacral insufficiency fracture  12/12/2018   Sarcoidosis    Sepsis ([place]) 07/30/2019   Sepsis due to Escherichia coli (e. 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   COLONOSCOPY     CYSTOSCOPY W/ RETROGRADES Left 07/30/2019   Procedure: CYSTOSCOPY WITH RETROGRADE PYELOGRAM LEFT STENT PLACEMENT;  Surgeon: Ardis Hughs, MD;  Location: [facility];  Service: Urology;  Laterality: Left;   CYSTOSCOPY WITH RETROGRADE PYELOGRAM, URETEROSCOPY AND STENT PLACEMENT Left 08/20/2019   Procedure: CYSTOSCOPY, URETEROSCOPY AND STENT PLACEMENT;  Surgeon: Robley Fries, MD;  Location: [facility];  Service: Urology;  Laterality: Left;  1 HR   FOOT SURGERY Right    HIP SURGERY Right 2011   full replacement   HOLMIUM LASER APPLICATION Left 16/11/9602   Procedure: HOLMIUM LASER APPLICATION;  Surgeon: Robley Fries, MD;  Location: [facility];  Service: Urology;  Laterality: Left;   LEFT HEART CATH AND CORONARY ANGIOGRAPHY N/A 08/09/2021   Procedure: LEFT HEART CATH AND CORONARY ANGIOGRAPHY;  Surgeon: Sherren Mocha, MD;  Location: [facility];  Service: Cardiovascular;  Laterality: N/A;   LEG SURGERY Left 06/2012   femur fracture s/p open and closed reduction in [place], Dr. Jimmye Norman   lens replacement Right    right eye   ORIF ELBOW FRACTURE Right 05/09/2017   Procedure: ORIF right olecranon fracture with repair/reconstruction, ulnar nerve transposition as needed;  Surgeon: Roseanne Kaufman, MD;  Location: [facility];  Service: Orthopedics;  Laterality: Right;  Requests 90 mins   pelvis fracture     POLYPECTOMY     REVERSE SHOULDER ARTHROPLASTY Left 06/13/2019   Procedure: REVERSE SHOULDER ARTHROPLASTY;  Surgeon: Justice Britain, MD;  Location: [facility];  Service: Orthopedics;  Laterality: Left;  130mn   SHOULDER ARTHROSCOPY W/ ROTATOR CUFF REPAIR Right    TRAPEZIUM RESECTION Right      Home Medications:  Prior to Admission medications   Medication Sig Start Date End Date Taking? Authorizing Provider  acetaminophen (TYLENOL) 500 MG tablet Take 1,000 mg by mouth every 6 (six) hours as needed for mild pain.     [provider]  aspirin 81 MG  chewable tablet Chew 81 mg by mouth daily.    [provider]  Budeson-Glycopyrrol-Formoterol (BREZTRI AEROSPHERE) 160-9-4.8 MCG/ACT AERO Inhale 2 puffs into the lungs in the morning and at bedtime. 09/29/21   BCollene Gobble MD  Calcium Carbonate Antacid (TUMS ULTRA 1000 PO) Take 2,000-3,000 mg by mouth at bedtime as needed (acid reflux/indigestion/heartburn).    [provider]  denosumab (PROLIA) 60 MG/ML SOLN injection Inject 60 mg into the skin every 6 (six) months. Administer in upper arm, thigh, or abdomen    [provider]  diltiazem (CARDIZEM CD) 120 MG 24 hr capsule Take 1 capsule (120 mg total) by mouth daily. 08/23/21   WRichardson DoppT, PA-C  doxycycline (VIBRA-TABS) 100 MG tablet Take 1 tablet (100 mg total) by mouth 2 (two) times daily for 7 days. 03/04/22 03/11/22  Cobb, KKarie Schwalbe NP  ELIQUIS 2.5 MG TABS tablet TAKE 1 TABLET(2.5 MG) BY  MOUTH TWICE DAILY 01/31/22   Sherren Mocha, MD  ipratropium-albuterol (DUONEB) 0.5-2.5 (3) MG/3ML SOLN Take 3 mLs by nebulization every 6 (six) hours as needed (wheezing; shortness of breath). Patient not taking: Reported on 03/04/2022 10/22/21   Clayton Bibles, NP  metoprolol tartrate (LOPRESSOR) 25 MG tablet Take 1 tablet (25 mg total) by mouth daily as needed (palpitations). 08/23/21   [name] T, PA-C  nitroGLYCERIN (NITROSTAT) 0.4 MG SL tablet Place 1 tablet (0.4 mg total) under the tongue every 5 (five) minutes as needed for chest pain. 11/06/19   [name] T, PA-C  omeprazole (PRILOSEC) 40 MG capsule Take 40 mg by mouth 2 (two) times daily.  11/03/13   [provider]  oxybutynin (DITROPAN) 5 MG tablet Take 5 mg by mouth at bedtime. 08/25/20   [provider]  predniSONE (DELTASONE) 10 MG tablet 4 tabs for 2 days, then 3 tabs for 2 days, 2 tabs for 2 days, then 1 tab for 2 days, then stop 03/04/22   Cobb, Karie Schwalbe, NP  sertraline (ZOLOFT) 50 MG tablet Take 1 tablet (50 mg total) by mouth at bedtime.  12/23/21   Melvenia Beam, MD  traMADol (ULTRAM) 50 MG tablet Take 50 mg by mouth every 8 (eight) hours as needed (pain). 01/12/21   [provider]  VENTOLIN HFA 108 (90 Base) MCG/ACT inhaler INHALE 2 PUFFS INTO THE LUNGS EVERY 4 HOURS AS NEEDED FOR WHEEZING OR SHORTNESS OF BREATH 02/15/22   Collene Gobble, MD  Vibegron (GEMTESA) 75 MG TABS Take 75 mg by mouth at bedtime.    [provider]    Inpatient Medications: Scheduled Meds:  metoprolol tartrate  5 mg Intravenous Once   Continuous Infusions:  diltiazem (CARDIZEM) infusion Stopped (03/09/22 1549)   PRN Meds:   Allergies:    Allergies  Allergen Reactions   Levaquin [Levofloxacin] Other (See Comments)    Joint pain.. Per doctor not to take again   Oxycodone Nausea Only    Can take oxycodone with Zofran Other reaction(s): vomiting   Sulfonamide Derivatives Other (See Comments)    Possibly caused hepatitis   Sulfa Antibiotics Other (See Comments)    Possibly caused hepatitis   Morphine Nausea Only    Social History:   Social History   Socioeconomic History   Marital status: Widowed    Spouse name: Not on file   Number of children: 1   Years of education: Not on file   Highest education level: Some college, no degree  Occupational History   Occupation: retired    Comment: still works as Scientist, 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  Tobacco Use   Smoking status: Never   Smokeless tobacco: Never  Vaping Use   Vaping Use: Never used  Substance and Sexual Activity   Alcohol use: No   Drug use: No   Sexual activity: Not Currently    Partners: Male    Birth control/protection: Post-menopausal  Other Topics Concern   Not on file  Social History Narrative   Lives with daughter and son in law   decaffeinated  use:  Coffee 1 per day   Right handed   Social Determinants of Health   Financial Resource Strain: Not on file  Food Insecurity: Not on file  Transportation Needs: Not on file  Physical  Activity: Not on file  Stress: Not on file  Social Connections: Not on file  Intimate Partner Violence: Not on file    Family History:  Family History  Problem Relation Age of Onset   Colon cancer Mother 15   Cancer Mother    Lung cancer Father    Heart disease Father    Arrhythmia Father    Cancer Father    Cancer Sister        ovarian   Heart attack Brother    Esophageal cancer Neg Hx    Rectal cancer Neg Hx    Stomach cancer Neg Hx    Stroke Neg Hx      ROS:  Please see the history of present illness.   All other ROS reviewed and negative.     Physical Exam/Data:   Vitals:   03/09/22 1550 03/09/22 1600 03/09/22 1615 03/09/22 1630  BP: 104/61 (!) 99/57 (!) 109/56 126/84  Pulse: (!) 172 (!) 170 (!) 144 (!) 148  Resp: (!) 25 (!) 22 (!) 25 20  Temp:      TempSrc:      SpO2: 97% 99% 99% 100%   No intake or output data in the 24 hours ending 03/09/22 1657    03/04/2022    2:29 PM 10/22/2021    4:42 PM 10/07/2021    4:39 PM  Last 3 Weights  Weight (lbs) 127 lb 6.4 oz 125 lb 3.2 oz 125 lb 3.2 oz  Weight (kg) 57.788 kg 56.79 kg 56.79 kg     There is no height or weight on file to calculate BMI.  General:  Patient is ill appearing, visibly short of breath HEENT: normal Neck: no JVD Vascular: No carotid bruits; Distal pulses 2+ bilaterally Cardiac:  normal S1, S2; irregularly irregular Lungs:  diffuse bilateral inspiratory and expiratory wheezing. Abd: soft, nontender, no hepatomegaly  Ext: no edema Musculoskeletal:  No deformities, BUE and BLE strength normal and equal Skin: warm and dry  Neuro:  CNs 2-12 intact, no focal abnormalities noted Psych:  Normal affect   EKG:  The EKG was personally reviewed and demonstrates:  afib with RVR Telemetry:  Telemetry was personally reviewed and demonstrates:  atrial fibrillation with RVR, ventricular rates 140-170bpm.   Relevant CV Studies: 08/09/21 LHC  1.  Mild nonobstructive plaquing in the RCA (dominant vessel),  unchanged from previous study 2.  Patent left main with no significant stenosis 3.  Patent LAD with mild proximal vessel stenosis of 40% 4.  Moderate ostial circumflex stenosis estimated at 60 to 70%, unchanged from the previous study.   The patient has stable coronary anatomy from her prior cardiac catheterization study in 2017.  Recommend continued medical therapy.  Okay to resume apixaban 2.5 mg twice daily tomorrow morning.  08/08/21 TTE  IMPRESSIONS     1. Left ventricular ejection fraction, by estimation, is 55 to 60%. The  left ventricle has normal function. The left ventricle has no regional  wall motion abnormalities. Left ventricular diastolic parameters are  consistent with Grade II diastolic  dysfunction (pseudonormalization).   2. Right ventricular systolic function is normal. The right ventricular  size is normal. There is normal pulmonary artery systolic pressure. The  estimated right ventricular systolic pressure is 24.2 mmHg.   3. The mitral valve is grossly normal. Mild mitral valve regurgitation.   4. Tricuspid valve regurgitation is mild to moderate.   5. The aortic valve is tricuspid. There is mild calcification of the  aortic valve. There is mild thickening of the aortic valve. Aortic valve  regurgitation is not visualized. Aortic valve sclerosis is present, with  no evidence of aortic valve stenosis.  6. The inferior vena cava is normal in size with greater than 50%  respiratory variability, suggesting right atrial pressure of 3 mmHg.   Comparison(s): No significant change from prior study.   FINDINGS   Left Ventricle: Left ventricular ejection fraction, by estimation, is 55  to 60%. The left ventricle has normal function. The left ventricle has no  regional wall motion abnormalities. Global longitudinal strain performed  but not reported based on  interpreter judgement due to suboptimal tracking. The left ventricular  internal cavity size was normal in  size. There is no left ventricular  hypertrophy. Left ventricular diastolic parameters are consistent with  Grade II diastolic dysfunction  (pseudonormalization).   Right Ventricle: The right ventricular size is normal. No increase in  right ventricular wall thickness. Right ventricular systolic function is  normal. There is normal pulmonary artery systolic pressure. The tricuspid  regurgitant velocity is 2.66 m/s, and   with an assumed right atrial pressure of 3 mmHg, the estimated right  ventricular systolic pressure is 13.0 mmHg.   Left Atrium: Left atrial size was normal in size.   Right Atrium: Right atrial size was normal in size.   Pericardium: There is no evidence of pericardial effusion.   Mitral Valve: The mitral valve is grossly normal. Mild mitral annular  calcification. Mild mitral valve regurgitation.   Tricuspid Valve: The tricuspid valve is normal in structure. Tricuspid  valve regurgitation is mild to moderate. No evidence of tricuspid  stenosis.   Aortic Valve: The aortic valve is tricuspid. There is mild calcification  of the aortic valve. There is mild thickening of the aortic valve. There  is mild aortic valve annular calcification. Aortic valve regurgitation is  not visualized. Aortic valve  sclerosis is present, with no evidence of aortic valve stenosis.   Pulmonic Valve: The pulmonic valve was normal in structure. Pulmonic valve  regurgitation is mild. No evidence of pulmonic stenosis.   Aorta: The aortic root and ascending aorta are structurally normal, with  no evidence of dilitation.   Venous: The inferior vena cava is normal in size with greater than 50%  respiratory variability, suggesting right atrial pressure of 3 mmHg.   IAS/Shunts: No atrial level shunt detected by color flow Doppler.    Laboratory Data:  High Sensitivity Troponin:  No results for input(s): "TROPONINIHS" in the last 720 hours.   Chemistry Recent Labs  Lab 03/09/22 1325   NA 135  K 3.5  CL 103  CO2 19*  GLUCOSE 123*  BUN 26*  CREATININE 1.10*  CALCIUM 8.8*  GFRNONAA 49*  ANIONGAP 13    Recent Labs  Lab 03/09/22 1325  PROT 5.9*  ALBUMIN 3.3*  AST 24  ALT 15  ALKPHOS 78  BILITOT 0.5   Lipids No results for input(s): "CHOL", "TRIG", "HDL", "LABVLDL", "LDLCALC", "CHOLHDL" in the last 168 hours.  Hematology Recent Labs  Lab 03/09/22 1325  WBC 15.5*  RBC 4.87  HGB 13.6  HCT 42.7  MCV 87.7  MCH 27.9  MCHC 31.9  RDW 16.4*  PLT 279   Thyroid No results for input(s): "TSH", "FREET4" in the last 168 hours.  BNP Recent Labs  Lab 03/09/22 1325  BNP 254.2*    DDimer No results for input(s): "DDIMER" in the last 168 hours.   Radiology/Studies:  DG Chest Port 1 View  Result Date: 03/09/2022 CLINICAL DATA:  Short of breath EXAM: PORTABLE CHEST 1 VIEW COMPARISON:  None Available. FINDINGS: Lungs are hyperinflated. Chronic bronchitic markings. No  effusion, infiltrate or pneumothorax. LEFT shoulder arthroplasty. IMPRESSION: Hyperinflated lungs and chronic bronchitic change. No acute findings. Electronically Signed   By: Suzy Bouchard M.D.   On: 03/09/2022 14:23     Assessment and Plan:   [name] is a 88 y.o. female with a hx of CAD, paroxysmal afib, HFpEF, aortic atherosclerosis, pulmonary sarcoidosis, bronchiectasis, hypertension, hyperlipidemia, SVT, carotid stenosis, dementia who is being seen 03/09/2022 for the evaluation of afib with RVR in the setting of RSV at the request of Dr. Nechama Guard.  Paroxysmal atrial fibrillation with RVR Secondary hypercoagulable state  Patient with acute dyspnea and RSV, noted to be in afib with RVR. Current home regimen includes Diltiazem CD '120mg'$ , Eliquis 2.'5mg'$ . Per daughter, mixed compliance with medications. Patient initially placed on Diltiazem in the ED but without reduction in rate. Patient became hypotensive requiring cessation of diltiazem and administration of a fluid bolus. BP now recovered  and patient continues with rapid ventricular rate.  Afib RVR likely due to RSV on top of notable underlying lung disease. Rate control/rhythm control will be challenging.  Will try Metoprolol Tartrate '25mg'$  Q6h as well as one time dose of Digoxin .'0125mg'$ . If patient remains significantly tachycardic, would need to bolus amiodarone and initiation infusion. This would be for rate control purposes though some assumed stroke risk with cardioversion in the setting of [place] noncompliance.  Rate goal <130bpm with acute illness. Continue Eliquis 2.'5mg'$  daily Check TSH Check echocardiogram tomorrow (preferably when patient has lower rates) Maintain K>4, Mg>2  Chronic HFpEF  Patient with LVEF 55-60% as of 08/08/21 TTE. Grade II diastolic dysfunction and RV pressure estimated 31.19mHg. BNP this admission elevated to 254.2. No evidence of acute volume overload on exam. However, given hx diastolic dysfunction, would strongly caution against excessive IVF.   Repeat echocardiogram tomorrow No plans for changes to GDMT at this time given chronically low BP/acute hypotension this admission.  CAD  Patient with most recent LTarnovon 08/09/21 showing mild nonobstructive plaquing in the RCA (dominant vessel) unchanged from previous study, patent left main with no significant stenosis patent LAD with mild proximal vessel stenosis of 40%, moderate ostial circumflex stenosis estimated at 60 to 70%, unchanged from the previous study.  Continue ASA '81mg'$  daily  Essential hypertension  BP low following administration of IV Diltiazem. Would strongly caution against excessive IVF given chronic HFpEF.  Pulmonary sarcoidosis  Per primary team  Risk Assessment/Risk Scores:          CHA2DS2-VASc Score = 6   This indicates a 9.7% annual risk of stroke. The patient's score is based upon: CHF History: 1 HTN History: 1 Diabetes History: 0 Stroke History: 0 Vascular Disease History: 1 Age Score: 2 Gender Score: 1          For questions or updates, please contact CMillingportPlease consult [email or web address] for contact info under    Signed, ELily Kocher PA-C  03/09/2022 4:57 PM

## 2022-03-09 NOTE — ED Provider Notes (Signed)
[facility] EMERGENCY DEPARTMENT Provider Note   CSN: [id number] Arrival date & time: 03/09/22  1319     History  Chief Complaint  Patient presents with   Shortness of Breath   Atrial Fibrillation    [name] is a 88 y.o. female.  HPI Patient presents in respiratory distress via EMS.  History is obtained by those individuals as the patient is in respiratory distress, level 5 caveat. Per report the patient has a history of COPD, A-fib, has been taking her medication regularly, but since yesterday has had increasing fatigue and difficulty breathing.  She denies pain, but cannot answer other questions substantially.  EMS staff note that the patient was tachycardic, with a heart rate in the 180, 200 range, but hypotensive and did not receive diltiazem.  She did receive multiple bronchodilators, and steroids, however as well as nonrebreather mask.      Home Medications Prior to Admission medications   Medication Sig Start Date End Date Taking? Authorizing Provider  acetaminophen (TYLENOL) 500 MG tablet Take 1,000 mg by mouth every 6 (six) hours as needed for mild pain.     [provider]  aspirin 81 MG chewable tablet Chew 81 mg by mouth daily.    [provider]  Budeson-Glycopyrrol-Formoterol (BREZTRI AEROSPHERE) 160-9-4.8 MCG/ACT AERO Inhale 2 puffs into the lungs in the morning and at bedtime. 09/29/21   Collene Gobble, MD  Calcium Carbonate Antacid (TUMS ULTRA 1000 PO) Take 2,000-3,000 mg by mouth at bedtime as needed (acid reflux/indigestion/heartburn).    [provider]  denosumab (PROLIA) 60 MG/ML SOLN injection Inject 60 mg into the skin every 6 (six) months. Administer in upper arm, thigh, or abdomen    [provider]  diltiazem (CARDIZEM CD) 120 MG 24 hr capsule Take 1 capsule (120 mg total) by mouth daily. 08/23/21   [name] T, PA-C  doxycycline (VIBRA-TABS) 100 MG tablet Take 1 tablet (100 mg total) by  mouth 2 (two) times daily for 7 days. 03/04/22 03/11/22  Cobb, Karie Schwalbe, NP  ELIQUIS 2.5 MG TABS tablet TAKE 1 TABLET(2.5 MG) BY MOUTH TWICE DAILY 01/31/22   Sherren Mocha, MD  ipratropium-albuterol (DUONEB) 0.5-2.5 (3) MG/3ML SOLN Take 3 mLs by nebulization every 6 (six) hours as needed (wheezing; shortness of breath). Patient not taking: Reported on 03/04/2022 10/22/21   Clayton Bibles, NP  metoprolol tartrate (LOPRESSOR) 25 MG tablet Take 1 tablet (25 mg total) by mouth daily as needed (palpitations). 08/23/21   [name] T, PA-C  nitroGLYCERIN (NITROSTAT) 0.4 MG SL tablet Place 1 tablet (0.4 mg total) under the tongue every 5 (five) minutes as needed for chest pain. 11/06/19   [name] T, PA-C  omeprazole (PRILOSEC) 40 MG capsule Take 40 mg by mouth 2 (two) times daily.  11/03/13   [provider]  oxybutynin (DITROPAN) 5 MG tablet Take 5 mg by mouth at bedtime. 08/25/20   [provider]  predniSONE (DELTASONE) 10 MG tablet 4 tabs for 2 days, then 3 tabs for 2 days, 2 tabs for 2 days, then 1 tab for 2 days, then stop 03/04/22   Cobb, Karie Schwalbe, NP  sertraline (ZOLOFT) 50 MG tablet Take 1 tablet (50 mg total) by mouth at bedtime. 12/23/21   Melvenia Beam, MD  traMADol (ULTRAM) 50 MG tablet Take 50 mg by mouth every 8 (eight) hours as needed (pain). 01/12/21   [provider]  VENTOLIN HFA 108 (90 Base) MCG/ACT inhaler  INHALE 2 PUFFS INTO THE LUNGS EVERY 4 HOURS AS NEEDED FOR WHEEZING OR SHORTNESS OF BREATH 02/15/22   Collene Gobble, MD  Vibegron (GEMTESA) 75 MG TABS Take 75 mg by mouth at bedtime.    [provider]      Allergies    Levaquin [levofloxacin], Oxycodone, Sulfonamide derivatives, Sulfa antibiotics, and Morphine    Review of Systems   Review of Systems  Unable to perform ROS: Acuity of condition    Physical Exam Updated Vital Signs BP (!) 145/134   Pulse (!) 161   Temp (!) 97.4 F (36.3 C) (Axillary)   Resp (!) 21   LMP  02/22/1983 (Approximate)   SpO2 99%  Physical Exam Vitals and nursing note reviewed.  Constitutional:      General: She is in acute distress.     Appearance: She is ill-appearing and diaphoretic.  HENT:     Head: Normocephalic and atraumatic.  Eyes:     Conjunctiva/sclera: Conjunctivae normal.  Cardiovascular:     Rate and Rhythm: Normal rate and regular rhythm.  Pulmonary:     Effort: Tachypnea, accessory muscle usage and respiratory distress present.     Breath sounds: No stridor. Decreased breath sounds, wheezing and rhonchi present.  Abdominal:     General: There is no distension.  Musculoskeletal:     Right lower leg: No edema.     Left lower leg: No edema.  Skin:    General: Skin is warm.  Neurological:     Mental Status: She is alert and oriented to person, place, and time.     Cranial Nerves: No cranial nerve deficit.  Psychiatric:        Mood and Affect: Mood normal.     ED Results / Procedures / Treatments   Labs (all labs ordered are listed, but only abnormal results are displayed) Labs Reviewed  RESP PANEL BY RT-PCR (RSV, FLU A&B, COVID)  RVPGX2 - Abnormal; Notable for the following components:      Result Value   Resp Syncytial Virus by PCR POSITIVE (*)    All other components within normal limits  COMPREHENSIVE METABOLIC PANEL - Abnormal; Notable for the following components:   CO2 19 (*)    Glucose, Bld 123 (*)    BUN 26 (*)    Creatinine, Ser 1.10 (*)    Calcium 8.8 (*)    Total Protein 5.9 (*)    Albumin 3.3 (*)    GFR, Estimated 49 (*)    All other components within normal limits  BRAIN NATRIURETIC PEPTIDE - Abnormal; Notable for the following components:   B Natriuretic Peptide 254.2 (*)    All other components within normal limits  CBC WITH DIFFERENTIAL/PLATELET - Abnormal; Notable for the following components:   WBC 15.5 (*)    RDW 16.4 (*)    Neutro Abs 11.5 (*)    Monocytes Absolute 1.2 (*)    Abs Immature Granulocytes 0.42 (*)    All  other components within normal limits    EKG EKG Interpretation  Date/Time:  Wednesday March 09 2022 13:33:08 EST Ventricular Rate:  159 PR Interval:    QRS Duration: 100 QT Interval:  273 QTC Calculation: 444 R Axis:   8 Text Interpretation: Atrial fibrillation with rapid V-rate LVH with secondary repolarization abnormality Artifact in lead(s) III aVL and baseline wander in lead(s) I III aVL Abnormal ECG Confirmed by Carmin Muskrat [phone]) on 03/09/2022 1:37:48 PM  Radiology DG Chest [facility] 1 [address]  Result Date: 03/09/2022 CLINICAL DATA:  Short of breath EXAM: PORTABLE CHEST 1 VIEW COMPARISON:  None Available. FINDINGS: Lungs are hyperinflated. Chronic bronchitic markings. No effusion, infiltrate or pneumothorax. LEFT shoulder arthroplasty. IMPRESSION: Hyperinflated lungs and chronic bronchitic change. No acute findings. Electronically Signed   By: Suzy Bouchard M.D.   On: 03/09/2022 14:23    Procedures Procedures    Medications Ordered in ED Medications  diltiazem (CARDIZEM) 1 mg/mL load via infusion 20 mg (20 mg Intravenous Bolus from Bag 03/09/22 1350)    And  diltiazem (CARDIZEM) 125 mg in dextrose 5% 125 mL (1 mg/mL) infusion (5 mg/hr Intravenous Rate/Dose Change 03/09/22 1437)  albuterol (PROVENTIL) (2.5 MG/3ML) 0.083% nebulizer solution (10 mg/hr Nebulization Given by Other 03/09/22 1348)    ED Course/ Medical Decision Making/ A&P This patient with a Hx of COPD, A-fib presents to the ED for concern of respiratory distress, this involves an extensive number of treatment options, and is a complaint that carries with it a high risk of complications and morbidity.    The differential diagnosis includes likely multifactorial, COPD exacerbation, A-fib RVR, pneumonia, COVID, bacteremia, sepsis   Social Determinants of Health:  Advanced age  Additional history obtained:  Additional history and/or information obtained from EMS, notable for details included in HPI   After  the initial evaluation, orders, including: BiPAP continued bronchodilators diltiazem labs fluids monitoring were initiated.   Patient placed on Cardiac and Pulse-Oximetry Monitors. The patient was maintained on a cardiac monitor.  The cardiac monitored showed an rhythm of 150s A-fib abnormal The patient was also maintained on pulse oximetry. The readings were typically 99% with supplemental oxygen abnormal   On repeat evaluation of the patient improved 3:16 PM Patient markedly better, now off BiPAP, but she continues to have A-fib, RVR.  She is accompanied by her daughter who assists with history, including pertinent details of the patient possibly not being compliant with her medications, Eliquis specifically.   Lab Tests:  I personally interpreted labs.  The pertinent results include: RSV positive, BNP 200  Imaging Studies ordered:  I independently visualized and interpreted imaging which showed no pneumonia, x-ray consistent with COPD I agree with the radiologist interpretation  Consultations Obtained:  I requested consultation with the cardiology,  and discussed lab and imaging findings and their consultation is pending on signout  Dispostion / Final MDM:  After consideration of the diagnostic results and the patient's response to treatment, this adult female presents via EMS in respiratory distress.  History of both COPD and A-fib are likely contributory to her presentation.  Patient's resuscitation and EMS included magnesium, steroids, bronchodilators.  Here she continued to receive bronchodilators, and was placed on BiPAP.  She was also started on diltiazem bolus, drip.  As the patient's history per daughter, was inconsistent with anticoagulation is not a candidate for cardioversion, and the patient's blood pressure improved here when she was weaned off BiPAP.  Patient will require admission, continued bronchodilators, and on signout is awaiting cardiology consult.  Final  Clinical Impression(s) / ED Diagnoses Final diagnoses:  Respiratory distress  Atrial fibrillation with rapid ventricular response ([place])   CRITICAL CARE Performed by: Carmin Muskrat Total critical care time: 35 minutes Critical care time was exclusive of separately billable procedures and treating other patients. Critical care was necessary to treat or prevent imminent or life-threatening deterioration. Critical care was time spent personally by me on the following activities: development of treatment plan with patient and/or surrogate as well as nursing, discussions  with consultants, evaluation of patient's response to treatment, examination of patient, obtaining history from patient or surrogate, ordering and performing treatments and interventions, ordering and review of laboratory studies, ordering and review of radiographic studies, pulse oximetry and re-evaluation of patient's condition.    Carmin Muskrat, MD 03/09/22 [phone]

## 2022-03-09 NOTE — ED Provider Notes (Deleted)
  Physical Exam  BP (!) 145/134   Pulse (!) 161   Temp (!) 97.4 F (36.3 C) (Axillary)   Resp (!) 21   LMP 02/22/1983 (Approximate)   SpO2 99%   Physical Exam Cardiovascular:     Rate and Rhythm: Tachycardia present. Rhythm irregular.  Pulmonary:     Effort: Tachypnea present.  Neurological:     Mental Status: She is alert.     Procedures  Procedures  ED Course / MDM   Clinical Course as of 03/09/22 1844  Wed Mar 09, 2022  1510 87 F with COPD exacerbation and A fib RVR on dilt and Eliquis at home. She is RSV positive today. BNP 254. On dilt gtt s/p dilt bolus. Holding off cardioversion due to noncompliance.  Got steroids and Mg with EMS. Getting duonebs here. Follow up Cards consult and admit. [VB]  2725 I was messaged by nurse because patient's blood pressures were getting soft with SBP in the 80s, Dilt drip was discontinued, cardiology recommendations still pending.  Heart rate 170s, ordered IV fluid bolus with improvement of pressure to 109/56.  Ordered IV metoprolol push. [VB]  [id number] Cardiology has seen and evaluated patient, they are recommending trialing scheduled metoprolol with one-time dose of digoxin.  If remaining severely tachycardic then recommending trialing amiodarone.  She has received approximately 1250 IV fluids.  I stopped her second bolus after 250 cc. HR in 130s.BP stable.  Reaching out to hospitalist for admission. [VB]  1844 S/w Dr Trilby Drummer who will admit for further workup and management of COPD exacerbation and A fib RVR. [VB]    Clinical Course User Index [VB] Elgie Congo, MD   Medical Decision Making Amount and/or Complexity of Data Reviewed Labs: ordered. Radiology: ordered.  Risk Prescription drug management. Decision regarding hospitalization.         Elgie Congo, MD 03/09/22 [phone]

## 2022-03-09 NOTE — ED Notes (Signed)
Multiple low BP readings on both arms, RN discontinued Cardizem drip. MD made aware. HR remains 165-180.

## 2022-03-09 NOTE — H&P (Signed)
History and Physical   [name] DZH:[id number] DOB: 12-13-1934 DOA: 03/09/2022  PCP: [name], MD   Patient coming from: Home  Chief Complaint: Shortness of breath  HPI: [name] is a 88 y.o. female with medical history significant of pulmonary sarcoid, scoliosis, hyperlipidemia, hypertension, COPD, bronchiectasis, cognitive decline, carotid artery disease, coronary artery disease, PSVT, atrial fibrillation, low back pain, anemia, GERD, diastolic CHF presenting with shortness of breath.  Patient initially arrived in respiratory distress but has since improved.  She had ongoing fatigue and shortness of breath that have been progressive for the past day or so.  She reported taking all her medications but later reports that she may be intermittent at least with her anticoagulant**.  EMS reported patient initially tachycardic in the 180s to 200s but diltiazem was held due to low blood pressure.  She did receive nebulizers, IV steroids, supplemental oxygen and route.  Initially had to be placed on BiPAP in the ED.  She denies fevers, chills, chest pain, abdominal pain, constipation, diarrhea, nausea, vomiting.  ED Course: Vital signs in the ED significant for heart rate in the 140s to 170s, blood pressure in the 41D to 622 systolic, respiratory rate in the 20s, initially requiring BiPAP now weaned to 6 L.  Lab workup included CMP with bicarb 19, BUN 26, creatinine of 1.1 from baseline of 0.9, glucose 123, calcium 8.8, protein 5.9, albumin 3.5.  CBC with leukocytosis to 15.5.  BNP borderline at 254.  TSH pending.  Respiratory panel positive for RSV.  Chest x-ray showed hyperinflated lungs and chronic bronchitic changes without acute abnormality.  Patient received IV metoprolol, IV diltiazem drip which was discontinued, p.o. metoprolol, p.o. digoxin, 1250 cc IV fluids, Eliquis, albuterol in the ED.  Cardiology was consulted and due to low blood pressures recommended one-time dose of  digoxin and scheduled metoprolol and if patient fails to respond to this amiodarone drip.  Review of Systems: As per HPI otherwise all other systems reviewed and are negative.  Past Medical History:  Diagnosis Date   Acute maxillary sinusitis 10/17/2017   Acute posthemorrhagic anemia 08/22/2012   Acute renal failure syndrome ([place]) 01/04/2019   Allergy    SEASONAL   Anemia    Arthritis    Asthma    "slight"    Baker's cyst of knee, right    CAD (coronary artery disease)    a. Myoview 10/15 - normal EF 70% // Myoview 11/16: EF 75%, normal perfusion, low risk // c. LHC 3/17 - LAD irregs, oLCx 70 (neg FFR), mRCA 30, EF 55-65% >> med Rx // Myoview 07/2018:  EF 73, extracardiac uptake, no significant ischemia (reviewed with Dr. Burt Knack), Low Risk // Myoview 6/22: EF 72, normal perfusion, low risk   Carotid stenosis    a. Carotid US 9/15 - Bilateral 1-39% ICA >> FU 2 years // b. Bilateral ICA 1-39 >> FU prn // Carotid US 06/2018: bilat 1-39; fu prn   Closed fracture of right olecranon process 05/09/2017   Closed left subtrochanteric femur fracture S/P Open/closed and reduction, internal medullary fixation  09/05/2012   Dyspnea    due to Sarcoidosis. When is windy or humed   Dysrhythmia    fast heart rate   Edema 10/30/2019   Elbow fracture 04/2017   Right, had surgery   Esophageal reflux    Fracture of inferior pubic ramus ([place]) 02/28/2019   Hematochezia    Hip fracture ([place]) 12/12/2018   History of echocardiogram  a. Echo 10/15 - EF 60-65%, no RWMA   HLD (hyperlipidemia)    Hx of colonoscopy    Hydronephrosis 07/29/2019   Left foot pain 09/02/2014   Assessment: 88 year old with acquired leg length discrepancy on the left who presents with 10 days of acute onset left foot pain. She seemed to be doing well prior to this pain with her orthotics that had been made to correct her leg length discrepancy. Negative x-rays at outside office which were again reviewed today.   This pain does not  seem to impair her function and which has actually been im   Macular pucker, right eye    will have removed on 01/26/21   Osteoporosis    Other chronic pulmonary heart diseases    Paroxysmal supraventricular tachycardia    Pneumonia    Pneumonia 09/18/2019   Pre-op evaluation 06/11/2019   Pyelonephritis 07/29/2019   Sacral insufficiency fracture 12/12/2018   Sarcoidosis    Sepsis ([place]) 07/30/2019   Sepsis due to Escherichia coli (e. 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   COLONOSCOPY     CYSTOSCOPY W/ RETROGRADES Left 07/30/2019   Procedure: CYSTOSCOPY WITH RETROGRADE PYELOGRAM LEFT STENT PLACEMENT;  Surgeon: Ardis Hughs, MD;  Location: [facility];  Service: Urology;  Laterality: Left;   CYSTOSCOPY WITH RETROGRADE PYELOGRAM, URETEROSCOPY AND STENT PLACEMENT Left 08/20/2019   Procedure: CYSTOSCOPY, URETEROSCOPY AND STENT PLACEMENT;  Surgeon: Robley Fries, MD;  Location: [facility];  Service: Urology;  Laterality: Left;  1 HR   FOOT SURGERY Right    HIP SURGERY Right 2011   full replacement   HOLMIUM LASER APPLICATION Left 25/36/6440   Procedure: HOLMIUM LASER APPLICATION;  Surgeon: Robley Fries, MD;  Location: [facility];  Service: Urology;  Laterality: Left;   LEFT HEART CATH AND CORONARY ANGIOGRAPHY N/A 08/09/2021   Procedure: LEFT HEART CATH AND CORONARY ANGIOGRAPHY;  Surgeon: Sherren Mocha, MD;  Location: [facility];  Service: Cardiovascular;  Laterality: N/A;   LEG SURGERY Left 06/2012   femur fracture s/p open and closed reduction in  [place], Dr. Jimmye Norman   lens replacement Right    right eye   ORIF ELBOW FRACTURE Right 05/09/2017   Procedure: ORIF right olecranon fracture with repair/reconstruction, ulnar nerve transposition as needed;  Surgeon: Roseanne Kaufman, MD;  Location: [facility];  Service: Orthopedics;  Laterality: Right;  Requests 90 mins   pelvis fracture     POLYPECTOMY     REVERSE SHOULDER ARTHROPLASTY Left 06/13/2019   Procedure: REVERSE SHOULDER ARTHROPLASTY;  Surgeon: Justice Britain, MD;  Location: [facility];  Service: Orthopedics;  Laterality: Left;  157mn   SHOULDER ARTHROSCOPY W/ ROTATOR CUFF REPAIR Right    TRAPEZIUM RESECTION Right     Social History  reports that she has never smoked. She has never used smokeless tobacco. She reports that she does not drink alcohol and does not use drugs.  Allergies  Allergen Reactions   Levaquin [Levofloxacin] Other (See Comments)    Joint pain.. Per doctor not to take again  Oxycodone Nausea Only    Can take oxycodone with Zofran Other reaction(s): vomiting   Sulfonamide Derivatives Other (See Comments)    Possibly caused hepatitis   Sulfa Antibiotics Other (See Comments)    Possibly caused hepatitis   Morphine Nausea Only    Family History  Problem Relation Age of Onset   Colon cancer Mother 74   Cancer Mother    Lung cancer Father    Heart disease Father    Arrhythmia Father    Cancer Father    Cancer Sister        ovarian   Heart attack Brother    Esophageal cancer Neg Hx    Rectal cancer Neg Hx    Stomach cancer Neg Hx    Stroke Neg Hx   Reviewed on admission  Prior to Admission medications   Medication Sig Start Date End Date Taking? Authorizing Provider  acetaminophen (TYLENOL) 500 MG tablet Take 1,000 mg by mouth every 6 (six) hours as needed for mild pain.     [provider]  aspirin 81 MG chewable tablet Chew 81 mg by mouth daily.    [provider]  Budeson-Glycopyrrol-Formoterol (BREZTRI AEROSPHERE) 160-9-4.8  MCG/ACT AERO Inhale 2 puffs into the lungs in the morning and at bedtime. 09/29/21   Collene Gobble, MD  Calcium Carbonate Antacid (TUMS ULTRA 1000 PO) Take 2,000-3,000 mg by mouth at bedtime as needed (acid reflux/indigestion/heartburn).    [provider]  denosumab (PROLIA) 60 MG/ML SOLN injection Inject 60 mg into the skin every 6 (six) months. Administer in upper arm, thigh, or abdomen    [provider]  diltiazem (CARDIZEM CD) 120 MG 24 hr capsule Take 1 capsule (120 mg total) by mouth daily. 08/23/21   [name] T, PA-C  doxycycline (VIBRA-TABS) 100 MG tablet Take 1 tablet (100 mg total) by mouth 2 (two) times daily for 7 days. 03/04/22 03/11/22  Cobb, Karie Schwalbe, NP  ELIQUIS 2.5 MG TABS tablet TAKE 1 TABLET(2.5 MG) BY MOUTH TWICE DAILY 01/31/22   Sherren Mocha, MD  ipratropium-albuterol (DUONEB) 0.5-2.5 (3) MG/3ML SOLN Take 3 mLs by nebulization every 6 (six) hours as needed (wheezing; shortness of breath). Patient not taking: Reported on 03/04/2022 10/22/21   Clayton Bibles, NP  metoprolol tartrate (LOPRESSOR) 25 MG tablet Take 1 tablet (25 mg total) by mouth daily as needed (palpitations). 08/23/21   [name] T, PA-C  nitroGLYCERIN (NITROSTAT) 0.4 MG SL tablet Place 1 tablet (0.4 mg total) under the tongue every 5 (five) minutes as needed for chest pain. 11/06/19   [name] T, PA-C  omeprazole (PRILOSEC) 40 MG capsule Take 40 mg by mouth 2 (two) times daily.  11/03/13   [provider]  oxybutynin (DITROPAN) 5 MG tablet Take 5 mg by mouth at bedtime. 08/25/20   [provider]  predniSONE (DELTASONE) 10 MG tablet 4 tabs for 2 days, then 3 tabs for 2 days, 2 tabs for 2 days, then 1 tab for 2 days, then stop 03/04/22   Cobb, Karie Schwalbe, NP  sertraline (ZOLOFT) 50 MG tablet Take 1 tablet (50 mg total) by mouth at bedtime. 12/23/21   Melvenia Beam, MD  traMADol (ULTRAM) 50 MG tablet Take 50 mg by mouth every 8 (eight) hours as needed (pain). 01/12/21    [provider]  VENTOLIN HFA 108 (90 Base) MCG/ACT inhaler INHALE 2 PUFFS INTO THE LUNGS EVERY 4 HOURS AS NEEDED FOR WHEEZING OR SHORTNESS OF BREATH 02/15/22  Collene Gobble, MD  Vibegron (GEMTESA) 75 MG TABS Take 75 mg by mouth at bedtime.    [provider]    Physical Exam: Vitals:   03/09/22 1645 03/09/22 1745 03/09/22 1800 03/09/22 1815  BP: 125/63 120/67 107/67 (!) 101/54  Pulse: (!) 153 (!) 137 (!) 140 (!) 144  Resp: (!) 24 (!) 22 (!) 21 (!) 22  Temp:      TempSrc:      SpO2: 99% 97% 97% 97%    Physical Exam Constitutional:      General: She is not in acute distress.    Appearance: Normal appearance.  HENT:     Head: Normocephalic and atraumatic.     Mouth/Throat:     Mouth: Mucous membranes are moist.     Pharynx: Oropharynx is clear.  Eyes:     Extraocular Movements: Extraocular movements intact.     Pupils: Pupils are equal, round, and reactive to light.  Cardiovascular:     Rate and Rhythm: Tachycardia present. Rhythm irregular.     Pulses: Normal pulses.     Heart sounds: Normal heart sounds.  Pulmonary:     Effort: Pulmonary effort is normal. No respiratory distress.     Breath sounds: Wheezing (trace) and rhonchi present.  Abdominal:     General: Bowel sounds are normal. There is no distension.     Palpations: Abdomen is soft.     Tenderness: There is no abdominal tenderness.  Musculoskeletal:        General: No swelling or deformity.  Skin:    General: Skin is warm and dry.  Neurological:     General: No focal deficit present.     Mental Status: Mental status is at baseline.    Labs on Admission: I have personally reviewed following labs and imaging studies  CBC: Recent Labs  Lab 03/09/22 1325  WBC 15.5*  NEUTROABS 11.5*  HGB 13.6  HCT 42.7  MCV 87.7  PLT 355    Basic Metabolic Panel: Recent Labs  Lab 03/09/22 1325  NA 135  K 3.5  CL 103  CO2 19*  GLUCOSE 123*  BUN 26*  CREATININE 1.10*  CALCIUM 8.8*     GFR: Estimated Creatinine Clearance: 30.9 mL/min (A) (by C-G formula based on SCr of 1.1 mg/dL (H)).  Liver Function Tests: Recent Labs  Lab 03/09/22 1325  AST 24  ALT 15  ALKPHOS 78  BILITOT 0.5  PROT 5.9*  ALBUMIN 3.3*    Urine analysis:    Component Value Date/Time   COLORURINE STRAW (A) 09/18/2019 2100   APPEARANCEUR CLEAR 09/18/2019 2100   LABSPEC 1.034 (H) 09/18/2019 2100   PHURINE 7.0 09/18/2019 2100   GLUCOSEU NEGATIVE 09/18/2019 2100   HGBUR NEGATIVE 09/18/2019 2100   BILIRUBINUR NEGATIVE 09/18/2019 2100   BILIRUBINUR negative 03/21/2018 [place] 09/18/2019 2100   PROTEINUR NEGATIVE 09/18/2019 2100   UROBILINOGEN negative (A) 03/21/2018 1328   UROBILINOGEN 0.2 12/08/2008 1204   NITRITE NEGATIVE 09/18/2019 2100   LEUKOCYTESUR NEGATIVE 09/18/2019 2100    Radiological Exams on Admission: DG Chest Port 1 View  Result Date: 03/09/2022 CLINICAL DATA:  Short of breath EXAM: PORTABLE CHEST 1 VIEW COMPARISON:  None Available. FINDINGS: Lungs are hyperinflated. Chronic bronchitic markings. No effusion, infiltrate or pneumothorax. LEFT shoulder arthroplasty. IMPRESSION: Hyperinflated lungs and chronic bronchitic change. No acute findings. Electronically Signed   By: Suzy Bouchard M.D.   On: 03/09/2022 14:23    EKG: Independently reviewed.  Atrial  fibrillation with RVR at 159 bpm.  LVH with repolarization abnormality.  Artifact and baseline wander in multiple leads.  Assessment/Plan Principal Problem:   COPD exacerbation (HCC) Active Problems:   CAD (coronary artery disease)   Chronic heart failure with preserved ejection fraction (HCC)   Paroxysmal supraventricular tachycardia (HCC)   GERD (gastroesophageal reflux disease)   Low back pain   Anemia   HLD (hyperlipidemia)   Essential hypertension   Carotid artery disease (HCC)   Bronchiectasis with acute exacerbation (HCC)   Paroxysmal atrial fibrillation with RVR (HCC)   Acute respiratory  failure with hypoxia (HCC)   COPD exacerbation Acute respiratory failure with hypoxia History of bronchiectasis RSV infection > Patient presenting with progressive shortness of breath and fatigue for the past day. > Noted to have new oxygen requirement and significant tachycardia > Positive for RSV.  Did have leukocytosis but chest x-ray stable.  At this point leukocytosis is presumed reactive. > Atrial fibrillation with RVR may be contributing to hypoxia some as below. > Received IV Solu-Medrol, nebulizer treatments and placed on oxygen around by EMS.  Was temporarily on BiPAP in the ED but has improved. - Monitor in progressive unit - Continue with home inhalers - Scheduled Atrovent - As needed albuterol - Daily steroids - Continue supplemental oxygen, wean as tolerated-   Atrial fibrillation with RVR History of PSVT > Patient also noted to be in A-fib with RVR.  Initial rates as high as the 180s to 200s via EMS.  In the ED as high as 160s to 170s.  Received dose of IV metoprolol and then IV diltiazem and started on IV diltiazem drip. > Patient became hypotensive and with consultation of cardiology was switched to one-time dose of p.o. digoxin and then scheduled p.o. metoprolol.   > If patient is unable to maintain heart rate in the 120s to low 130s, will need to initiate amiodarone drip. - Appreciate cardiology recommendations - Monitoring in progressive unit as above - Continue with scheduled metoprolol for now - Plan for amiodarone bolus and drip if heart rate elevates persistently in the upper 130s or higher. - Echocardiogram - Eliquis - Follow-up TSH  Hyperlipidemia Carotid artery disease Coronary artery disease - Continue home aspirin - Plan to resume home metoprolol when able  Hypertension > Low normal blood pressure here with treatments for A-fib with RVR - Currently receiving metoprolol as above  Chronic diastolic CHF > Last echo was in June of last year with EF  55-60%, G2 DD, normal RV function.  GERD - Continue home PPI  History of pulmonary sarcoid Scoliosis - Noted  DVT prophylaxis: Eliquis Code Status:   DNR/DNI, Confirmed with patient on admission Family Communication:  Updated at bedside  Disposition Plan:   Patient is from:  Home  Anticipated DC to:  Home  Anticipated DC date:  2 to 5 days  Anticipated DC barriers: None  Consults called:  Cardiology consulted in the ED Admission status:  Inpatient, progressive  Severity of Illness: The appropriate patient status for this patient is INPATIENT. Inpatient status is judged to be reasonable and necessary in order to provide the required intensity of service to ensure the patient's safety. The patient's presenting symptoms, physical exam findings, and initial radiographic and laboratory data in the context of their chronic comorbidities is felt to place them at high risk for further clinical deterioration. Furthermore, it is not anticipated that the patient will be medically stable for discharge from the hospital within 2 midnights of  admission.   * I certify that at the point of admission it is my clinical judgment that the patient will require inpatient hospital care spanning beyond 2 midnights from the point of admission due to high intensity of service, high risk for further deterioration and high frequency of surveillance required.Marcelyn Bruins MD [facility]  How to contact the [facility] Attending or Consulting provider [place] or covering provider during after hours [place], for this patient?   Check the care team in [facility] and look for a) attending/consulting TR[facility] provider listed and b) the [facility] team listed Log into [email or web address] and use [place]'s universal password to access. If you do not have the password, please contact the hospital operator. Locate the [facility] provider you are looking for under [facility] and page to a number that you can be directly reached. If  you still have difficulty reaching the provider, please page the [facility] (Director on Call) for the Hospitalists listed on amion for assistance.  03/09/2022, 6:36 PM

## 2022-03-09 NOTE — Progress Notes (Signed)
03/04/2022: Spoke with daughter, [name] to review CXR results. Small pleural effusion, possibly parapneumonic given infectious symptoms. She did not appear to be volume overloaded in office. Continue doxycycline as previously prescribed. Strict ED/return precautions. Verbalized understanding.

## 2022-03-09 NOTE — Progress Notes (Signed)
  Amiodarone Drug - Drug Interaction Consult Note  Recommendations: No regimen changes currently necessary at this time. Continue to monitor for adverse effects of amiodarone.   Amiodarone is metabolized by the cytochrome P450 system and therefore has the potential to cause many drug interactions. Amiodarone has an average plasma half-life of 50 days (range 20 to 100 days).   There is potential for drug interactions to occur several weeks or months after stopping treatment and the onset of drug interactions may be slow after initiating amiodarone.   '[]'$  Statins: Increased risk of myopathy. Simvastatin- restrict dose to '20mg'$  daily. Other statins: counsel patients to report any muscle pain or weakness immediately.  '[]'$  Anticoagulants: Amiodarone can increase anticoagulant effect. Consider warfarin dose reduction. Patients should be monitored closely and the dose of anticoagulant altered accordingly, remembering that amiodarone levels take several weeks to stabilize.  '[]'$  Antiepileptics: Amiodarone can increase plasma concentration of phenytoin, the dose should be reduced. Note that small changes in phenytoin dose can result in large changes in levels. Monitor patient and counsel on signs of toxicity.  '[]'$  Beta blockers: increased risk of bradycardia, AV block and myocardial depression. Sotalol - avoid concomitant use.  '[]'$   Calcium channel blockers (diltiazem and verapamil): increased risk of bradycardia, AV block and myocardial depression.  '[]'$   Cyclosporine: Amiodarone increases levels of cyclosporine. Reduced dose of cyclosporine is recommended.  '[]'$  Digoxin dose should be halved when amiodarone is started.  '[]'$  Diuretics: increased risk of cardiotoxicity if hypokalemia occurs.  '[]'$  Oral hypoglycemic agents (glyburide, glipizide, glimepiride): increased risk of hypoglycemia. Patient's glucose levels should be monitored closely when initiating amiodarone therapy.   '[]'$  Drugs that prolong the QT  interval:  Torsades de pointes risk may be increased with concurrent use - avoid if possible.  Monitor QTc, also keep magnesium/potassium WNL if concurrent therapy can't be avoided.  Antibiotics: e.g. fluoroquinolones, erythromycin.  Antiarrhythmics: e.g. quinidine, procainamide, disopyramide, sotalol.  Antipsychotics: e.g. phenothiazines, haloperidol.   Lithium, tricyclic antidepressants, and methadone.  Thank You,   Gena Fray, PharmD PGY1 Pharmacy Resident   03/09/2022 8:50 PM

## 2022-03-09 NOTE — ED Triage Notes (Signed)
Pt from home for eval of sob since last night. Hx sarcoidosis and COPD. Pt in respiratory distress on EMS arrival, though was not hypoxic. Given '5mg'$  albuterol and a duoneb, '125mg'$  solumedrol, and 2g mag without much improvement. Also in afib RVR, but no cardizem given d/t hypotension.

## 2022-03-10 ENCOUNTER — Encounter (HOSPITAL_COMMUNITY): Payer: Self-pay | Admitting: Internal Medicine

## 2022-03-10 ENCOUNTER — Inpatient Hospital Stay (HOSPITAL_COMMUNITY): Payer: Medicare Other

## 2022-03-10 DIAGNOSIS — J441 Chronic obstructive pulmonary disease with (acute) exacerbation: Secondary | ICD-10-CM | POA: Diagnosis not present

## 2022-03-10 DIAGNOSIS — I4891 Unspecified atrial fibrillation: Secondary | ICD-10-CM

## 2022-03-10 DIAGNOSIS — J9601 Acute respiratory failure with hypoxia: Secondary | ICD-10-CM | POA: Diagnosis not present

## 2022-03-10 DIAGNOSIS — J121 Respiratory syncytial virus pneumonia: Secondary | ICD-10-CM | POA: Diagnosis not present

## 2022-03-10 DIAGNOSIS — I480 Paroxysmal atrial fibrillation: Secondary | ICD-10-CM | POA: Diagnosis not present

## 2022-03-10 LAB — COMPREHENSIVE METABOLIC PANEL
ALT: 14 U/L (ref 0–44)
AST: 15 U/L (ref 15–41)
Albumin: 2.8 g/dL — ABNORMAL LOW (ref 3.5–5.0)
Alkaline Phosphatase: 64 U/L (ref 38–126)
Anion gap: 9 (ref 5–15)
BUN: 30 mg/dL — ABNORMAL HIGH (ref 8–23)
CO2: 23 mmol/L (ref 22–32)
Calcium: 8.5 mg/dL — ABNORMAL LOW (ref 8.9–10.3)
Chloride: 102 mmol/L (ref 98–111)
Creatinine, Ser: 1.13 mg/dL — ABNORMAL HIGH (ref 0.44–1.00)
GFR, Estimated: 48 mL/min — ABNORMAL LOW (ref >60)
Glucose, Bld: 188 mg/dL — ABNORMAL HIGH (ref 70–99)
Potassium: 3.5 mmol/L (ref 3.5–5.1)
Sodium: 134 mmol/L — ABNORMAL LOW (ref 135–145)
Total Bilirubin: 0.6 mg/dL (ref 0.3–1.2)
Total Protein: 5.1 g/dL — ABNORMAL LOW (ref 6.5–8.1)

## 2022-03-10 LAB — MAGNESIUM: Magnesium: 2.3 mg/dL (ref 1.7–2.4)

## 2022-03-10 LAB — ECHOCARDIOGRAM COMPLETE
AR max vel: 2.36 cm2
AV Area VTI: 2.17 cm2
AV Area mean vel: 2.19 cm2
AV Mean grad: 4 mmHg
AV Peak grad: 7.3 mmHg
Ao pk vel: 1.35 m/s
Area-P 1/2: 3.85 cm2

## 2022-03-10 LAB — CBC
HCT: 35.8 % — ABNORMAL LOW (ref 36.0–46.0)
Hemoglobin: 11.8 g/dL — ABNORMAL LOW (ref 12.0–15.0)
MCH: 28.5 pg (ref 26.0–34.0)
MCHC: 33 g/dL (ref 30.0–36.0)
MCV: 86.5 fL (ref 80.0–100.0)
Platelets: 229 10*3/uL (ref 150–400)
RBC: 4.14 MIL/uL (ref 3.87–5.11)
RDW: 16.5 % — ABNORMAL HIGH (ref 11.5–15.5)
WBC: 11.7 10*3/uL — ABNORMAL HIGH (ref 4.0–10.5)
nRBC: 0 % (ref 0.0–0.2)

## 2022-03-10 MED ORDER — METOPROLOL TARTRATE 25 MG PO TABS
25.0000 mg | ORAL_TABLET | Freq: Two times a day (BID) | ORAL | Status: DC
  Administered 2022-03-10 – 2022-03-17 (×13): 25 mg via ORAL
  Filled 2022-03-10 (×16): qty 1

## 2022-03-10 NOTE — ED Notes (Signed)
ED TO INPATIENT HANDOFF REPORT  ED Nurse Name and Phone #: [phone]  S Name/Age/Gender [name] 88 y.o. female Room/Bed: 024C/024C  Code Status   Code Status: DNR  Home/SNF/Other Home Patient oriented to: self, place, time, and situation Is this baseline? Yes   Triage Complete: Triage complete  Chief Complaint COPD exacerbation ([place]) [J44.1]  Triage Note Pt from home for eval of sob since last night. Hx sarcoidosis and COPD. Pt in respiratory distress on EMS arrival, though was not hypoxic. Given '5mg'$  albuterol and a duoneb, '125mg'$  solumedrol, and 2g mag without much improvement. Also in afib RVR, but no cardizem given d/t hypotension.     Allergies Allergies  Allergen Reactions   Levaquin [Levofloxacin] Other (See Comments)    Joint pain.. Per doctor not to take again   Oxycodone Nausea Only    Can take oxycodone with Zofran Other reaction(s): vomiting   Sulfonamide Derivatives Other (See Comments)    Possibly caused hepatitis   Sulfa Antibiotics Other (See Comments)    Possibly caused hepatitis   Morphine Nausea Only    Level of Care/Admitting Diagnosis ED Disposition     ED Disposition  Admit   Condition  --   [name]: [place] [[id number]]  Level of Care: Progressive [102]  Admit to Progressive based on following criteria: RESPIRATORY PROBLEMS hypoxemic/hypercapnic respiratory failure that is responsive to NIPPV (BiPAP) or High Flow Nasal Cannula (6-80 lpm). Frequent assessment/intervention, no > Q2 hrs < Q4 hrs, to maintain oxygenation and pulmonary hygiene.  Admit to Progressive based on following criteria: CARDIOVASCULAR & THORACIC of moderate stability with acute coronary syndrome symptoms/low risk myocardial infarction/hypertensive urgency/arrhythmias/heart failure potentially compromising stability and stable post cardiovascular intervention patients.  May admit patient to [name] or [name] if equivalent level of  care is available:: No  Covid Evaluation: Confirmed COVID Negative  Diagnosis: COPD exacerbation [facility]) [[id number]]  Admitting Physician: Marcelyn Bruins [[id number]]  Attending Physician: Marcelyn Bruins [[id number]]  Certification:: I certify this patient will need inpatient services for at least 2 midnights  Estimated Length of Stay: 2          B Medical/Surgery History Past Medical History:  Diagnosis Date   Acute maxillary sinusitis 10/17/2017   Acute posthemorrhagic anemia 08/22/2012   Acute renal failure syndrome ([place]) 01/04/2019   Allergy    SEASONAL   Anemia    Arthritis    Asthma    "slight"    Baker's cyst of knee, right    CAD (coronary artery disease)    a. Myoview 10/15 - normal EF 70% // Myoview 11/16: EF 75%, normal perfusion, low risk // c. LHC 3/17 - LAD irregs, oLCx 70 (neg FFR), mRCA 30, EF 55-65% >> med Rx // Myoview 07/2018:  EF 73, extracardiac uptake, no significant ischemia (reviewed with Dr. Burt Knack), Low Risk // Myoview 6/22: EF 72, normal perfusion, low risk   Carotid stenosis    a. Carotid US 9/15 - Bilateral 1-39% ICA >> FU 2 years // b. Bilateral ICA 1-39 >> FU prn // Carotid US 06/2018: bilat 1-39; fu prn   Closed fracture of right olecranon process 05/09/2017   Closed left subtrochanteric femur fracture S/P Open/closed and reduction, internal medullary fixation  09/05/2012   Dyspnea    due to Sarcoidosis. When is windy or humed   Dysrhythmia    fast heart rate   Edema 10/30/2019   Elbow fracture 04/2017   Right, had surgery  Esophageal reflux    Fracture of inferior pubic ramus (HCC) 02/28/2019   Hematochezia    Hip fracture ([place]) 12/12/2018   History of echocardiogram    a. Echo 10/15 - EF 60-65%, no RWMA   HLD (hyperlipidemia)    Hx of colonoscopy    Hydronephrosis 07/29/2019   Left foot pain 09/02/2014   Assessment: 88 year old with acquired leg length discrepancy on the left who presents with 10 days of acute onset left foot pain. She  seemed to be doing well prior to this pain with her orthotics that had been made to correct her leg length discrepancy. Negative x-rays at outside office which were again reviewed today.   This pain does not seem to impair her function and which has actually been im   Macular pucker, right eye    will have removed on 01/26/21   Osteoporosis    Other chronic pulmonary heart diseases    Paroxysmal supraventricular tachycardia    Pneumonia    Pneumonia 09/18/2019   Pre-op evaluation 06/11/2019   Pyelonephritis 07/29/2019   Sacral insufficiency fracture 12/12/2018   Sarcoidosis    Sepsis ([place]) 07/30/2019   Sepsis due to Escherichia coli (e. 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   COLONOSCOPY     CYSTOSCOPY W/ RETROGRADES Left 07/30/2019   Procedure: CYSTOSCOPY WITH RETROGRADE PYELOGRAM LEFT STENT PLACEMENT;  Surgeon: Ardis Hughs, MD;  Location: [facility];  Service: Urology;  Laterality: Left;   CYSTOSCOPY WITH RETROGRADE PYELOGRAM, URETEROSCOPY AND STENT PLACEMENT Left 08/20/2019   Procedure: CYSTOSCOPY, URETEROSCOPY AND STENT PLACEMENT;  Surgeon: Robley Fries, MD;  Location: [facility];  Service: Urology;  Laterality: Left;  1 HR   FOOT SURGERY Right    HIP SURGERY Right 2011   full replacement   HOLMIUM LASER APPLICATION Left 78/67/6720   Procedure: HOLMIUM LASER APPLICATION;  Surgeon: Robley Fries, MD;  Location: [facility];  Service: Urology;  Laterality: Left;   LEFT HEART CATH AND CORONARY ANGIOGRAPHY N/A 08/09/2021   Procedure: LEFT HEART CATH AND  CORONARY ANGIOGRAPHY;  Surgeon: Sherren Mocha, MD;  Location: [facility];  Service: Cardiovascular;  Laterality: N/A;   LEG SURGERY Left 06/2012   femur fracture s/p open and closed reduction in [place], Dr. Jimmye Norman   lens replacement Right    right eye   ORIF ELBOW FRACTURE Right 05/09/2017   Procedure: ORIF right olecranon fracture with repair/reconstruction, ulnar nerve transposition as needed;  Surgeon: Roseanne Kaufman, MD;  Location: [facility];  Service: Orthopedics;  Laterality: Right;  Requests 90 mins   pelvis fracture     POLYPECTOMY     REVERSE SHOULDER ARTHROPLASTY Left 06/13/2019   Procedure: REVERSE SHOULDER ARTHROPLASTY;  Surgeon: Justice Britain, MD;  Location: [facility];  Service: Orthopedics;  Laterality: Left;  157mn   SHOULDER ARTHROSCOPY W/ ROTATOR CUFF REPAIR Right    TRAPEZIUM RESECTION Right      A IV Location/Drains/Wounds Patient Lines/Drains/Airways Status     Active Line/Drains/Airways     Name Placement date Placement time Site Days  Peripheral IV 03/10/22 20 G Right Forearm 03/10/22  1531  Forearm  less than 1   Ureteral Drain/Stent Left ureter 6 Fr. 08/20/19  0947  Left ureter  933   External Urinary Catheter 03/10/22  1759  --  less than 1            Intake/Output Last 24 hours  Intake/Output Summary (Last 24 hours) at 03/10/2022 1759 Last data filed at 03/10/2022 0737 Gross per 24 hour  Intake 502.37 ml  Output 101 ml  Net 401.37 ml    Labs/Imaging Results for orders placed or performed during the hospital encounter of 03/09/22 (from the past 48 hour(s))  Comprehensive metabolic panel     Status: Abnormal   Collection Time: 03/09/22  1:25 PM  Result Value Ref Range   Sodium 135 135 - 145 mmol/L   Potassium 3.5 3.5 - 5.1 mmol/L   Chloride 103 98 - 111 mmol/L   CO2 19 (L) 22 - 32 mmol/L   Glucose, Bld 123 (H) 70 - 99 mg/dL    Comment: Glucose reference range applies only to samples taken after fasting for at least 8 hours.   BUN 26  (H) 8 - 23 mg/dL   Creatinine, Ser 1.10 (H) 0.44 - 1.00 mg/dL   Calcium 8.8 (L) 8.9 - 10.3 mg/dL   Total Protein 5.9 (L) 6.5 - 8.1 g/dL   Albumin 3.3 (L) 3.5 - 5.0 g/dL   AST 24 15 - 41 U/L   ALT 15 0 - 44 U/L   Alkaline Phosphatase 78 38 - 126 U/L   Total Bilirubin 0.5 0.3 - 1.2 mg/dL   GFR, Estimated 49 (L) >60 mL/min    Comment: (NOTE) Calculated using the CKD-EPI Creatinine Equation (2021)    Anion gap 13 5 - 15    Comment: Performed at [facility], [place] [address], [place], [place] [address]  Brain natriuretic peptide     Status: Abnormal   Collection Time: 03/09/22  1:25 PM  Result Value Ref Range   B Natriuretic Peptide 254.2 (H) 0.0 - 100.0 pg/mL    Comment: Performed at [facility] [address], [place], [place] [address]  CBC with Differential     Status: Abnormal   Collection Time: 03/09/22  1:25 PM  Result Value Ref Range   WBC 15.5 (H) 4.0 - 10.5 K/uL   RBC 4.87 3.87 - 5.11 MIL/uL   Hemoglobin 13.6 12.0 - 15.0 g/dL   HCT 42.7 36.0 - 46.0 %   MCV 87.7 80.0 - 100.0 fL   MCH 27.9 26.0 - 34.0 pg   MCHC 31.9 30.0 - 36.0 g/dL   RDW 16.4 (H) 11.5 - 15.5 %   Platelets 279 150 - 400 K/uL   nRBC 0.0 0.0 - 0.2 %   Neutrophils Relative % 74 %   Neutro Abs 11.5 (H) 1.7 - 7.7 K/uL   Lymphocytes Relative 15 %   Lymphs Abs 2.2 0.7 - 4.0 K/uL   Monocytes Relative 8 %   Monocytes Absolute 1.2 (H) 0.1 - 1.0 K/uL   Eosinophils Relative 0 %   Eosinophils Absolute 0.0 0.0 - 0.5 K/uL   Basophils Relative 0 %   Basophils Absolute 0.0 0.0 - 0.1 K/uL   Immature Granulocytes 3 %   Abs Immature Granulocytes 0.42 (H) 0.00 - 0.07 K/uL    Comment: Performed at [facility] [address], [place], [place] [address]  Resp panel by RT-PCR (RSV,  Flu A&B, Covid) Anterior Nasal Swab     Status: Abnormal   Collection Time: 03/09/22  1:25 PM   Specimen: Anterior Nasal Swab  Result Value Ref Range   SARS Coronavirus 2 by RT PCR NEGATIVE NEGATIVE    Comment:  (NOTE) SARS-CoV-2 target nucleic acids are NOT DETECTED.  The SARS-CoV-2 RNA is generally detectable in upper respiratory specimens during the acute phase of infection. The lowest concentration of SARS-CoV-2 viral copies this assay can detect is 138 copies/mL. A negative result does not preclude SARS-Cov-2 infection and should not be used as the sole basis for treatment or other patient management decisions. A negative result may occur with  improper specimen collection/handling, submission of specimen other than nasopharyngeal swab, presence of viral mutation(s) within the areas targeted by this assay, and inadequate number of viral copies(<138 copies/mL). A negative result must be combined with clinical observations, patient history, and epidemiological information. The expected result is Negative.  Fact Sheet for Patients:  EntrepreneurPulse.com.au  Fact Sheet for Healthcare Providers:  IncredibleEmployment.be  This test is no t yet approved or cleared by the [place] FDA and  has been authorized for detection and/or diagnosis of SARS-CoV-2 by FDA under an Emergency Use Authorization (EUA). This EUA will remain  in effect (meaning this test can be used) for the duration of the COVID-19 declaration under Section 564(b)(1) of the Act, 21 U.S.C.section 360bbb-3(b)(1), unless the authorization is terminated  or revoked sooner.       Influenza A by PCR NEGATIVE NEGATIVE   Influenza B by PCR NEGATIVE NEGATIVE    Comment: (NOTE) The Xpert Xpress SARS-CoV-2/FLU/RSV plus assay is intended as an aid in the diagnosis of influenza from Nasopharyngeal swab specimens and should not be used as a sole basis for treatment. Nasal washings and aspirates are unacceptable for Xpert Xpress SARS-CoV-2/FLU/RSV testing.  Fact Sheet for Patients: EntrepreneurPulse.com.au  Fact Sheet for Healthcare  Providers: IncredibleEmployment.be  This test is not yet approved or cleared by the [place] FDA and has been authorized for detection and/or diagnosis of SARS-CoV-2 by FDA under an Emergency Use Authorization (EUA). This EUA will remain in effect (meaning this test can be used) for the duration of the COVID-19 declaration under Section 564(b)(1) of the Act, 21 U.S.C. section 360bbb-3(b)(1), unless the authorization is terminated or revoked.     Resp Syncytial Virus by PCR POSITIVE (A) NEGATIVE    Comment: (NOTE) Fact Sheet for Patients: EntrepreneurPulse.com.au  Fact Sheet for Healthcare Providers: IncredibleEmployment.be  This test is not yet approved or cleared by the [place] FDA and has been authorized for detection and/or diagnosis of SARS-CoV-2 by FDA under an Emergency Use Authorization (EUA). This EUA will remain in effect (meaning this test can be used) for the duration of the COVID-19 declaration under Section 564(b)(1) of the Act, 21 U.S.C. section 360bbb-3(b)(1), unless the authorization is terminated or revoked.  Performed at [facility], [place] [address], [place], [place] [address]   TSH     Status: None   Collection Time: 03/09/22  5:56 PM  Result Value Ref Range   TSH 1.031 0.350 - 4.500 uIU/mL    Comment: Performed by a 3rd Generation assay with a functional sensitivity of <=0.01 uIU/mL. Performed at [facility], [place] [address], [place], [place] [address]   Comprehensive metabolic panel     Status: Abnormal   Collection Time: 03/10/22  4:00 AM  Result Value Ref Range   Sodium 134 (L) 135 -  145 mmol/L   Potassium 3.5 3.5 - 5.1 mmol/L   Chloride 102 98 - 111 mmol/L   CO2 23 22 - 32 mmol/L   Glucose, Bld 188 (H) 70 - 99 mg/dL    Comment: Glucose reference range applies only to samples taken after fasting for at least 8 hours.   BUN 30 (H) 8 - 23 mg/dL   Creatinine, Ser 1.13 (H)  0.44 - 1.00 mg/dL   Calcium 8.5 (L) 8.9 - 10.3 mg/dL   Total Protein 5.1 (L) 6.5 - 8.1 g/dL   Albumin 2.8 (L) 3.5 - 5.0 g/dL   AST 15 15 - 41 U/L   ALT 14 0 - 44 U/L   Alkaline Phosphatase 64 38 - 126 U/L   Total Bilirubin 0.6 0.3 - 1.2 mg/dL   GFR, Estimated 48 (L) >60 mL/min    Comment: (NOTE) Calculated using the CKD-EPI Creatinine Equation (2021)    Anion gap 9 5 - 15    Comment: Performed at [facility], [place] [address], [place], [place] [address]  CBC     Status: Abnormal   Collection Time: 03/10/22  4:00 AM  Result Value Ref Range   WBC 11.7 (H) 4.0 - 10.5 K/uL   RBC 4.14 3.87 - 5.11 MIL/uL   Hemoglobin 11.8 (L) 12.0 - 15.0 g/dL   HCT 35.8 (L) 36.0 - 46.0 %   MCV 86.5 80.0 - 100.0 fL   MCH 28.5 26.0 - 34.0 pg   MCHC 33.0 30.0 - 36.0 g/dL   RDW 16.5 (H) 11.5 - 15.5 %   Platelets 229 150 - 400 K/uL   nRBC 0.0 0.0 - 0.2 %    Comment: Performed at [facility], [place] [address] [place], [place] [address]  Magnesium     Status: None   Collection Time: 03/10/22  4:00 AM  Result Value Ref Range   Magnesium 2.3 1.7 - 2.4 mg/dL    Comment: Performed at [facility] [address], [place], [place] [address]   ECHOCARDIOGRAM COMPLETE  Result Date: 03/10/2022    ECHOCARDIOGRAM REPORT   Patient Name:   [name] Date of Exam: 03/10/2022 Medical Rec #:  [id number]        Height:       63.0 in Accession #:    [id number]       Weight:       127.4 lb Date of Birth:  1934-03-24        BSA:          1.596 m Patient Age:    60 years         BP:           139/59 mmHg Patient Gender: F                HR:           72 bpm. Exam Location:  Inpatient Procedure: 2D Echo, Color Doppler and Cardiac Doppler Indications:    Atrial Fibrillation I48.91  History:        Patient has prior history of Echocardiogram examinations, most                 recent 08/08/2021. CAD, COPD, Arrythmias:Tachycardia and Atrial                 Fibrillation, Signs/Symptoms:Dyspnea; Risk  Factors:Hypertension                 and Dyslipidemia.  Sonographer:    Ronny Flurry Referring Phys: [id number] [place]  1. Left ventricular ejection fraction, by estimation, is 60 to 65%. The left ventricle has normal function. The left ventricle has no regional wall motion abnormalities. There is mild concentric left ventricular hypertrophy. Left ventricular diastolic parameters are consistent with Grade II diastolic dysfunction (pseudonormalization).  2. Right ventricular systolic function is normal. The right ventricular size is normal. There is mildly elevated pulmonary artery systolic pressure.  3. Trivial mitral valve regurgitation.  4. The aortic valve is grossly normal. Aortic valve regurgitation is trivial.  5. The inferior vena cava is normal in size with greater than 50% respiratory variability, suggesting right atrial pressure of 3 mmHg. Comparison(s): No significant change from prior study. FINDINGS  Left Ventricle: Left ventricular ejection fraction, by estimation, is 60 to 65%. The left ventricle has normal function. The left ventricle has no regional wall motion abnormalities. The left ventricular internal cavity size was normal in size. There is  mild concentric left ventricular hypertrophy. Left ventricular diastolic parameters are consistent with Grade II diastolic dysfunction (pseudonormalization). Right Ventricle: The right ventricular size is normal. Right ventricular systolic function is normal. There is mildly elevated pulmonary artery systolic pressure. The tricuspid regurgitant velocity is 2.88 m/s, and with an assumed right atrial pressure of 3 mmHg, the estimated right ventricular systolic pressure is 45.4 mmHg. Left Atrium: Left atrial size was normal in size. Right Atrium: Right atrial size was normal in size. Pericardium: There is no evidence of pericardial effusion. Mitral Valve: Trivial mitral valve regurgitation. Tricuspid Valve: Tricuspid valve  regurgitation is mild. Aortic Valve: The aortic valve is grossly normal. Aortic valve regurgitation is trivial. Aortic valve mean gradient measures 4.0 mmHg. Aortic valve peak gradient measures 7.3 mmHg. Aortic valve area, by VTI measures 2.17 cm. Pulmonic Valve: Pulmonic valve regurgitation is not visualized. Aorta: The aortic root and ascending aorta are structurally normal, with no evidence of dilitation. Venous: The inferior vena cava is normal in size with greater than 50% respiratory variability, suggesting right atrial pressure of 3 mmHg. IAS/Shunts: The interatrial septum was not well visualized.  LEFT VENTRICLE PLAX 2D LVOT diam:     2.00 cm   Diastology LV SV:         65        LV e' medial:    6.85 cm/s LV SV Index:   41        LV E/e' medial:  13.5 LVOT Area:     3.14 cm  LV e' lateral:   7.72 cm/s                          LV E/e' lateral: 12.0                           3D Volume EF:                          3D EF:        62 %                          LV EDV:       136 ml                          LV ESV:  52 ml                          LV SV:        84 ml RIGHT VENTRICLE RV Basal diam:  3.90 cm RV Mid diam:    2.10 cm RV S prime:     20.00 cm/s TAPSE (M-mode): 2.3 cm LEFT ATRIUM             Index        RIGHT ATRIUM           Index LA Vol (A2C):   32.7 ml 20.48 ml/m  RA Area:     12.80 cm LA Vol (A4C):   49.5 ml 31.01 ml/m  RA Volume:   30.00 ml  18.79 ml/m LA Biplane Vol: 43.2 ml 27.06 ml/m  AORTIC VALVE AV Area (Vmax):    2.36 cm AV Area (Vmean):   2.19 cm AV Area (VTI):     2.17 cm AV Vmax:           135.00 cm/s AV Vmean:          92.300 cm/s AV VTI:            0.301 m AV Peak Grad:      7.3 mmHg AV Mean Grad:      4.0 mmHg LVOT Vmax:         101.23 cm/s LVOT Vmean:        64.467 cm/s LVOT VTI:          0.208 m LVOT/AV VTI ratio: 0.69  AORTA Ao Root diam: 2.70 cm Ao Asc diam:  2.20 cm MITRAL VALVE               TRICUSPID VALVE MV Area (PHT): 3.85 cm    TR Peak grad:   33.2 mmHg MV  Decel Time: 197 msec    TR Vmax:        288.00 cm/s MV E velocity: 92.70 cm/s MV A velocity: 88.60 cm/s  SHUNTS MV E/A ratio:  1.05        Systemic VTI:  0.21 m                            Systemic Diam: 2.00 cm Phineas Inches Electronically signed by Phineas Inches Signature Date/Time: 03/10/2022/10:38:36 AM    Final    DG Chest Port 1 View  Result Date: 03/09/2022 CLINICAL DATA:  Short of breath EXAM: PORTABLE CHEST 1 VIEW COMPARISON:  None Available. FINDINGS: Lungs are hyperinflated. Chronic bronchitic markings. No effusion, infiltrate or pneumothorax. LEFT shoulder arthroplasty. IMPRESSION: Hyperinflated lungs and chronic bronchitic change. No acute findings. Electronically Signed   By: Suzy Bouchard M.D.   On: 03/09/2022 14:23    Pending Labs Unresulted Labs (From admission, onward)    None       Vitals/Pain Today's Vitals   03/10/22 1500 03/10/22 1600 03/10/22 1700 03/10/22 1758  BP: 138/61 (!) 152/60 (!) 140/65   Pulse: 69 66 66   Resp: (!) '24 17 17   '$ Temp:    97.9 F (36.6 C)  TempSrc:    Oral  SpO2: 95% 93% 91%   PainSc:        Isolation Precautions Droplet precaution  Medications Medications  apixaban (ELIQUIS) tablet 2.5 mg (2.5 mg Oral Given 03/10/22 1143)  aspirin chewable tablet 81 mg (81 mg Oral Given 03/10/22 1142)  pantoprazole (PROTONIX)  EC tablet 40 mg (40 mg Oral Given 03/10/22 1143)  predniSONE (DELTASONE) tablet 40 mg (40 mg Oral Given 03/10/22 1143)  ipratropium (ATROVENT) nebulizer solution 0.5 mg (0.5 mg Nebulization Given 03/10/22 1143)  albuterol (PROVENTIL) (2.5 MG/3ML) 0.083% nebulizer solution 2.5 mg (has no administration in time range)  sodium chloride flush (NS) 0.9 % injection 3 mL (3 mLs Intravenous Not Given 03/10/22 0921)  acetaminophen (TYLENOL) tablet 650 mg (has no administration in time range)    Or  acetaminophen (TYLENOL) suppository 650 mg (has no administration in time range)  polyethylene glycol (MIRALAX / GLYCOLAX) packet 17 g (has no  administration in time range)  umeclidinium bromide (INCRUSE ELLIPTA) 62.5 MCG/ACT 1 puff (1 puff Inhalation Given 03/10/22 1144)    And  fluticasone furoate-vilanterol (BREO ELLIPTA) 100-25 MCG/ACT 1 puff (1 puff Inhalation Given 03/10/22 1144)  amiodarone (NEXTERONE PREMIX) 360-4.14 MG/200ML-% (1.8 mg/mL) IV infusion (0 mg/hr Intravenous Stopped 03/10/22 0223)    Followed by  amiodarone (NEXTERONE PREMIX) 360-4.14 MG/200ML-% (1.8 mg/mL) IV infusion (30 mg/hr Intravenous New Bag/Given 03/10/22 1142)  metoprolol tartrate (LOPRESSOR) tablet 25 mg (25 mg Oral Given 03/10/22 1143)  albuterol (PROVENTIL) (2.5 MG/3ML) 0.083% nebulizer solution (10 mg/hr Nebulization Given by Other 03/09/22 1348)  diltiazem (CARDIZEM) 1 mg/mL load via infusion 20 mg (20 mg Intravenous Bolus from Bag 03/09/22 1350)  lactated ringers bolus 1,000 mL (0 mLs Intravenous Stopped 03/09/22 1708)  metoprolol tartrate (LOPRESSOR) injection 5 mg (5 mg Intravenous Given 03/09/22 1712)  lactated ringers bolus 1,000 mL (0 mLs Intravenous Stopped 03/09/22 1812)  amiodarone (NEXTERONE) 1.8 mg/mL load via infusion 150 mg (150 mg Intravenous Bolus from Bag 03/09/22 2048)    Mobility walks with device     Focused Assessments Cardiac Assessment Handoff:  Cardiac Rhythm: Normal sinus rhythm Lab Results  Component Value Date   CKTOTAL 53 01/08/2016   TROPONINI <0.03 04/29/2017   Lab Results  Component Value Date   DDIMER 1.10 (H) 04/15/2016   Does the Patient currently have chest pain? No    R Recommendations: See Admitting Provider Note  Report given to:   Additional Notes: not on o2 at home currently on 4L ambulates to [facility] with just one assist.  Lives with daughter

## 2022-03-10 NOTE — Progress Notes (Signed)
Echocardiogram 2D Echocardiogram has been performed.  [name] 03/10/2022, 10:06 AM

## 2022-03-10 NOTE — ED Notes (Signed)
Food ordered for pt.

## 2022-03-10 NOTE — Progress Notes (Signed)
Pt on 3L [place] and tolerating with no issues. Pt refuses CPAP QHS will continue to monitor

## 2022-03-10 NOTE — Progress Notes (Signed)
Pt arrived to unit at this time. Pt A&O x4. Vital signs obtained and tele monitoring applied. Bed placed in lowest position with bed alarm in place, pt personal items and call bell with in reach.

## 2022-03-10 NOTE — Hospital Course (Signed)
[name] is an 88 yo female with PMH A-fib, PSVT, pulmonary sarcoid, scoliosis, HLD, HTN, COPD, bronchiectasis, CAD, low back pain, anemia, GERD, diastolic CHF who presented with shortness of breath. On workup in the ER she was found to be in A-fib with RVR and hypoxic initially requiring BiPAP which was able to be weaned down to 6 L. Viral swab testing also was positive for RSV. Cardiology was consulted and she was started on an amiodarone drip due to soft blood pressures.

## 2022-03-10 NOTE — ED Notes (Signed)
Took over pt care at this time.

## 2022-03-10 NOTE — Progress Notes (Signed)
Cardiology Progress Note  Patient ID: [name] MRN: [id number] DOB: [birth year] Date of Encounter: 03/10/2022  Primary Cardiologist: Sherren Mocha, MD  Subjective   Chief Complaint: SOB  HPI: Placed on amiodarone overnight.  Has converted back to sinus rhythm.  ROS:  All other ROS reviewed and negative. Pertinent positives noted in the HPI.     Inpatient Medications  Scheduled Meds:  apixaban  2.5 mg Oral BID   aspirin  81 mg Oral Daily   umeclidinium bromide  1 puff Inhalation Daily   And   fluticasone furoate-vilanterol  1 puff Inhalation Daily   ipratropium  0.5 mg Nebulization Q6H   pantoprazole  40 mg Oral Daily   predniSONE  40 mg Oral Q breakfast   sodium chloride flush  3 mL Intravenous Q12H   Continuous Infusions:  amiodarone 30 mg/hr (03/10/22 0223)   PRN Meds: acetaminophen **OR** acetaminophen, albuterol, polyethylene glycol   Vital Signs   Vitals:   03/10/22 0400 03/10/22 0500 03/10/22 0600 03/10/22 0737  BP: (!) 141/57 130/64 (!) 139/59   Pulse: 75 77 71   Resp: '16 19 18   '$ Temp:    97.7 F (36.5 C)  TempSrc:    Oral  SpO2: 98% 99% 99%     Intake/Output Summary (Last 24 hours) at 03/10/2022 0841 Last data filed at 03/10/2022 0737 Gross per 24 hour  Intake 502.37 ml  Output 101 ml  Net 401.37 ml      03/04/2022    2:29 PM 10/22/2021    4:42 PM 10/07/2021    4:39 PM  Last 3 Weights  Weight (lbs) 127 lb 6.4 oz 125 lb 3.2 oz 125 lb 3.2 oz  Weight (kg) 57.788 kg 56.79 kg 56.79 kg      Telemetry  Overnight telemetry shows sinus rhythm in the 70s, which I personally reviewed.  Physical Exam   Vitals:   03/10/22 0400 03/10/22 0500 03/10/22 0600 03/10/22 0737  BP: (!) 141/57 130/64 (!) 139/59   Pulse: 75 77 71   Resp: '16 19 18   '$ Temp:    97.7 F (36.5 C)  TempSrc:    Oral  SpO2: 98% 99% 99%     Intake/Output Summary (Last 24 hours) at 03/10/2022 0841 Last data filed at 03/10/2022 0737 Gross per 24 hour  Intake 502.37 ml  Output 101  ml  Net 401.37 ml       03/04/2022    2:29 PM 10/22/2021    4:42 PM 10/07/2021    4:39 PM  Last 3 Weights  Weight (lbs) 127 lb 6.4 oz 125 lb 3.2 oz 125 lb 3.2 oz  Weight (kg) 57.788 kg 56.79 kg 56.79 kg    There is no height or weight on file to calculate BMI.  General: Well nourished, well developed, in no acute distress Head: Atraumatic, normal size  Eyes: PEERLA, EOMI  Neck: Supple, no JVD Endocrine: No thryomegaly Cardiac: Normal S1, S2; RRR; no murmurs, rubs, or gallops Lungs: Wheezing and rhonchi noted bilaterally Abd: Soft, nontender, no hepatomegaly  Ext: No edema, pulses 2+ Musculoskeletal: No deformities, BUE and BLE strength normal and equal Skin: Warm and dry, no rashes   Neuro: Alert and oriented to person, place, time, and situation, CNII-XII grossly intact, no focal deficits  Psych: Normal mood and affect   Labs  High Sensitivity Troponin:  No results for input(s): "TROPONINIHS" in the last 720 hours.   Cardiac EnzymesNo results for input(s): "TROPONINI" in the last 168 hours.  No results for input(s): "TROPIPOC" in the last 168 hours.  Chemistry Recent Labs  Lab 03/09/22 1325 03/10/22 0400  NA 135 134*  K 3.5 3.5  CL 103 102  CO2 19* 23  GLUCOSE 123* 188*  BUN 26* 30*  CREATININE 1.10* 1.13*  CALCIUM 8.8* 8.5*  PROT 5.9* 5.1*  ALBUMIN 3.3* 2.8*  AST 24 15  ALT 15 14  ALKPHOS 78 64  BILITOT 0.5 0.6  GFRNONAA 49* 48*  ANIONGAP 13 9    Hematology Recent Labs  Lab 03/09/22 1325 03/10/22 0400  WBC 15.5* 11.7*  RBC 4.87 4.14  HGB 13.6 11.8*  HCT 42.7 35.8*  MCV 87.7 86.5  MCH 27.9 28.5  MCHC 31.9 33.0  RDW 16.4* 16.5*  PLT 279 229   BNP Recent Labs  Lab 03/09/22 1325  BNP 254.2*    DDimer No results for input(s): "DDIMER" in the last 168 hours.   Radiology  DG Chest Port 1 View  Result Date: 03/09/2022 CLINICAL DATA:  Short of breath EXAM: PORTABLE CHEST 1 VIEW COMPARISON:  None Available. FINDINGS: Lungs are hyperinflated. Chronic  bronchitic markings. No effusion, infiltrate or pneumothorax. LEFT shoulder arthroplasty. IMPRESSION: Hyperinflated lungs and chronic bronchitic change. No acute findings. Electronically Signed   By: Suzy Bouchard M.D.   On: 03/09/2022 14:23    Cardiac Studies  LHC 08/09/2021 1.  Mild nonobstructive plaquing in the RCA (dominant vessel), unchanged from previous study 2.  Patent left main with no significant stenosis 3.  Patent LAD with mild proximal vessel stenosis of 40% 4.  Moderate ostial circumflex stenosis estimated at 60 to 70%, unchanged from the previous study.   The patient has stable coronary anatomy from her prior cardiac catheterization study in 2017.  Recommend continued medical therapy.  Okay to resume apixaban 2.5 mg twice daily tomorrow morning.  TTE 08/08/2021  1. Left ventricular ejection fraction, by estimation, is 55 to 60%. The  left ventricle has normal function. The left ventricle has no regional  wall motion abnormalities. Left ventricular diastolic parameters are  consistent with Grade II diastolic  dysfunction (pseudonormalization).   2. Right ventricular systolic function is normal. The right ventricular  size is normal. There is normal pulmonary artery systolic pressure. The  estimated right ventricular systolic pressure is 59.5 mmHg.   3. The mitral valve is grossly normal. Mild mitral valve regurgitation.   4. Tricuspid valve regurgitation is mild to moderate.   5. The aortic valve is tricuspid. There is mild calcification of the  aortic valve. There is mild thickening of the aortic valve. Aortic valve  regurgitation is not visualized. Aortic valve sclerosis is present, with  no evidence of aortic valve stenosis.   6. The inferior vena cava is normal in size with greater than 50%  respiratory variability, suggesting right atrial pressure of 3 mmHg.   Patient Profile  88 year old female with history of dementia, severe COPD (bronchiectasis), pulmonary  sarcoidosis, nonobstructive CAD, paroxysmal atrial fibrillation admitted on 03/09/2022 with acute hypoxic respiratory failure secondary to RSV infection. Cardiology consulted for atrial fibrillation with RVR.   Assessment & Plan   # Paroxysmal atrial fibrillation -Rates were not controlled.  Blood pressure was soft.  She has been transition to amiodarone drip and converted back to sinus rhythm. -Continue amiodarone drip today.  Transition to oral amiodarone tomorrow.  Would recommend a short course of amiodarone therapy to get through her respiratory illness.  I do not believe amiodarone is a good long-term medication for  her in the setting of lung illness however we plan to use this short-term. -Continue Eliquis 2.5 mg twice daily. -Suspect she has missed doses due to memory issues.  However rates were very difficult to control.  Amiodarone was pursued due to soft blood pressure. -Add back metoprolol to tartrate 25 mg twice daily. -Echo is pending today. -Suspect her A-fib has been driven by respiratory illness.  # Chronic HFpEF -No signs of volume overload.  Continue home medications.  # RSV infection # Pulmonary sarcoidosis # Severe COPD/bronchiectasis -Per hospital team    For questions or updates, please contact [place] Please consult [email or web address] for contact info under   Signed, [place] T. Audie Box, MD, [place]  03/10/2022 8:41 AM

## 2022-03-10 NOTE — ED Notes (Signed)
Purewick replaced with new one new diaper on

## 2022-03-10 NOTE — Progress Notes (Signed)
Progress Note    [name]   UEA:[id number]  DOB: 07/17/1934  DOA: 03/09/2022     1 PCP: [name], MD  Initial CC: SOB  Hospital Course: Ms. Foland is an 88 yo female with PMH A-fib, PSVT, pulmonary sarcoid, scoliosis, HLD, HTN, COPD, bronchiectasis, CAD, low back pain, anemia, GERD, diastolic CHF who presented with shortness of breath. On workup in the ER she was found to be in A-fib with RVR and hypoxic initially requiring BiPAP which was able to be weaned down to 6 L. Viral swab testing also was positive for RSV. Cardiology was consulted and she was started on an amiodarone drip due to soft blood pressures.  Interval History:  Seen this morning in the ER.  No family present bedside.  Breathing felt slightly improved.  Still some wheezing on exam.  Oxygen demands had been further weaned since admission and she was down to 2 L.  Assessment and Plan:  COPD exacerbation Acute respiratory failure with hypoxia History of bronchiectasis RSV infection > Patient presenting with progressive shortness of breath and fatigue for the past day. > Noted to have new oxygen requirement and significant tachycardia > Positive for RSV.  Did have leukocytosis but chest x-ray stable.  At this point leukocytosis is presumed reactive. > Atrial fibrillation with RVR may be contributing to hypoxia some as below. > Received IV Solu-Medrol, nebulizer treatments and placed on oxygen around by EMS.  Was temporarily on BiPAP in the ED but has improved. -Continue breathing treatments, steroids - Wean oxygen as able   Atrial fibrillation with RVR History of PSVT > Patient also noted to be in A-fib with RVR.  Initial rates as high as the 180s to 200s via EMS.  In the ED as high as 160s to 170s.  Received dose of IV metoprolol and then IV diltiazem and started on IV diltiazem drip. > Patient became hypotensive and with consultation of cardiology was switched to one-time dose of p.o. digoxin and  then scheduled p.o. metoprolol.   - patient ultimately started on amiodarone drip for rate control - patient noted to be converted back to NSR this morning - appreciate cardiology assistance; IV amio planned for today then short course PO amio starting tomorrow in setting of underlying lung disease -Echo ordered on admission: EF 60 to 65%, no RWMA, mild LVH, grade 2 DD -TSH normal, 1.031 - Continue Eliquis   Hyperlipidemia Carotid artery disease Coronary artery disease - Continue home aspirin -Continue metoprolol   Hypertension > Low normal blood pressure here with treatments for A-fib with RVR - Currently receiving metoprolol as above   Chronic diastolic CHF > Last echo was in June of last year with EF 55-60%, G2 DD, normal RV function. -No significant changes noted on repeat echo on admission   GERD - Continue home PPI   History of pulmonary sarcoid - follows with Dr. [name]; last CT June 2023 noted stable -Short course of amiodarone planned per cardiology in context of this as well    Old records reviewed in assessment of this patient  Antimicrobials:   DVT prophylaxis:  apixaban (ELIQUIS) tablet 2.5 mg Start: 03/09/22 2200 apixaban (ELIQUIS) tablet 2.5 mg   Code Status:   Code Status: DNR  Mobility Assessment (last 72 hours)     Mobility Assessment   No documentation.           Barriers to discharge:  Disposition Plan:  Home Status is: Inpt  Objective: Blood pressure 137/63, pulse  66, temperature 97.7 F (36.5 C), temperature source Oral, resp. rate 17, last menstrual period 02/22/1983, SpO2 99 %.  Examination:  Physical Exam Constitutional:      General: She is not in acute distress.    Appearance: Normal appearance.  HENT:     Head: Normocephalic and atraumatic.     Mouth/Throat:     Mouth: Mucous membranes are moist.  Eyes:     Extraocular Movements: Extraocular movements intact.  Cardiovascular:     Rate and Rhythm: Normal rate and  regular rhythm.  Pulmonary:     Effort: Pulmonary effort is normal. No respiratory distress.     Breath sounds: Wheezing and rhonchi present.  Abdominal:     General: Bowel sounds are normal. There is no distension.     Palpations: Abdomen is soft.     Tenderness: There is no abdominal tenderness.  Musculoskeletal:        General: No swelling. Normal range of motion.     Cervical back: Normal range of motion and neck supple.  Skin:    General: Skin is warm and dry.  Neurological:     General: No focal deficit present.     Mental Status: She is alert.  Psychiatric:        Mood and Affect: Mood normal.        Behavior: Behavior normal.      Consultants:  Cardiology  Procedures:    Data Reviewed: Results for orders placed or performed during the hospital encounter of 03/09/22 (from the past 24 hour(s))  TSH     Status: None   Collection Time: 03/09/22  5:56 PM  Result Value Ref Range   TSH 1.031 0.350 - 4.500 uIU/mL  Comprehensive metabolic panel     Status: Abnormal   Collection Time: 03/10/22  4:00 AM  Result Value Ref Range   Sodium 134 (L) 135 - 145 mmol/L   Potassium 3.5 3.5 - 5.1 mmol/L   Chloride 102 98 - 111 mmol/L   CO2 23 22 - 32 mmol/L   Glucose, Bld 188 (H) 70 - 99 mg/dL   BUN 30 (H) 8 - 23 mg/dL   Creatinine, Ser 1.13 (H) 0.44 - 1.00 mg/dL   Calcium 8.5 (L) 8.9 - 10.3 mg/dL   Total Protein 5.1 (L) 6.5 - 8.1 g/dL   Albumin 2.8 (L) 3.5 - 5.0 g/dL   AST 15 15 - 41 U/L   ALT 14 0 - 44 U/L   Alkaline Phosphatase 64 38 - 126 U/L   Total Bilirubin 0.6 0.3 - 1.2 mg/dL   GFR, Estimated 48 (L) >60 mL/min   Anion gap 9 5 - 15  CBC     Status: Abnormal   Collection Time: 03/10/22  4:00 AM  Result Value Ref Range   WBC 11.7 (H) 4.0 - 10.5 K/uL   RBC 4.14 3.87 - 5.11 MIL/uL   Hemoglobin 11.8 (L) 12.0 - 15.0 g/dL   HCT 35.8 (L) 36.0 - 46.0 %   MCV 86.5 80.0 - 100.0 fL   MCH 28.5 26.0 - 34.0 pg   MCHC 33.0 30.0 - 36.0 g/dL   RDW 16.5 (H) 11.5 - 15.5 %    Platelets 229 150 - 400 K/uL   nRBC 0.0 0.0 - 0.2 %  Magnesium     Status: None   Collection Time: 03/10/22  4:00 AM  Result Value Ref Range   Magnesium 2.3 1.7 - 2.4 mg/dL    I have reviewed pertinent  nursing notes, vitals, labs, and images as necessary. I have ordered labwork to follow up on as indicated.  I have reviewed the last notes from staff over past 24 hours. I have discussed patient's care plan and test results with nursing staff, CM/SW, and other staff as appropriate.  Time spent: Greater than 50% of the 55 minute visit was spent in counseling/coordination of care for the patient as laid out in the A&P.   LOS: 1 day   Dwyane Dee, MD [facility] 03/10/2022, 1:57 PM

## 2022-03-11 DIAGNOSIS — J121 Respiratory syncytial virus pneumonia: Secondary | ICD-10-CM

## 2022-03-11 DIAGNOSIS — I480 Paroxysmal atrial fibrillation: Secondary | ICD-10-CM | POA: Diagnosis not present

## 2022-03-11 DIAGNOSIS — J9601 Acute respiratory failure with hypoxia: Secondary | ICD-10-CM | POA: Diagnosis not present

## 2022-03-11 DIAGNOSIS — J441 Chronic obstructive pulmonary disease with (acute) exacerbation: Secondary | ICD-10-CM | POA: Diagnosis not present

## 2022-03-11 LAB — BASIC METABOLIC PANEL
Anion gap: 10 (ref 5–15)
BUN: 31 mg/dL — ABNORMAL HIGH (ref 8–23)
CO2: 25 mmol/L (ref 22–32)
Calcium: 8.5 mg/dL — ABNORMAL LOW (ref 8.9–10.3)
Chloride: 102 mmol/L (ref 98–111)
Creatinine, Ser: 0.95 mg/dL (ref 0.44–1.00)
GFR, Estimated: 59 mL/min — ABNORMAL LOW (ref >60)
Glucose, Bld: 85 mg/dL (ref 70–99)
Potassium: 4.2 mmol/L (ref 3.5–5.1)
Sodium: 137 mmol/L (ref 135–145)

## 2022-03-11 LAB — CBC WITH DIFFERENTIAL/PLATELET
Abs Immature Granulocytes: 0.35 10*3/uL — ABNORMAL HIGH (ref 0.00–0.07)
Basophils Absolute: 0 10*3/uL (ref 0.0–0.1)
Basophils Relative: 0 %
Eosinophils Absolute: 0.1 10*3/uL (ref 0.0–0.5)
Eosinophils Relative: 1 %
HCT: 37.3 % (ref 36.0–46.0)
Hemoglobin: 12.5 g/dL (ref 12.0–15.0)
Immature Granulocytes: 2 %
Lymphocytes Relative: 5 %
Lymphs Abs: 0.9 10*3/uL (ref 0.7–4.0)
MCH: 29.3 pg (ref 26.0–34.0)
MCHC: 33.5 g/dL (ref 30.0–36.0)
MCV: 87.4 fL (ref 80.0–100.0)
Monocytes Absolute: 0.8 10*3/uL (ref 0.1–1.0)
Monocytes Relative: 5 %
Neutro Abs: 15.4 10*3/uL — ABNORMAL HIGH (ref 1.7–7.7)
Neutrophils Relative %: 87 %
Platelets: 254 10*3/uL (ref 150–400)
RBC: 4.27 MIL/uL (ref 3.87–5.11)
RDW: 16.6 % — ABNORMAL HIGH (ref 11.5–15.5)
WBC: 17.7 10*3/uL — ABNORMAL HIGH (ref 4.0–10.5)
nRBC: 0 % (ref 0.0–0.2)

## 2022-03-11 LAB — MAGNESIUM: Magnesium: 2.1 mg/dL (ref 1.7–2.4)

## 2022-03-11 MED ORDER — AMIODARONE HCL 200 MG PO TABS
200.0000 mg | ORAL_TABLET | Freq: Two times a day (BID) | ORAL | Status: DC
  Administered 2022-03-18: 200 mg via ORAL
  Filled 2022-03-11: qty 1

## 2022-03-11 MED ORDER — IPRATROPIUM BROMIDE 0.02 % IN SOLN
0.5000 mg | Freq: Two times a day (BID) | RESPIRATORY_TRACT | Status: DC
  Administered 2022-03-11 – 2022-03-13 (×5): 0.5 mg via RESPIRATORY_TRACT
  Filled 2022-03-11 (×5): qty 2.5

## 2022-03-11 MED ORDER — AMIODARONE HCL 200 MG PO TABS
200.0000 mg | ORAL_TABLET | Freq: Two times a day (BID) | ORAL | Status: AC
  Administered 2022-03-11 – 2022-03-17 (×14): 200 mg via ORAL
  Filled 2022-03-11 (×15): qty 1

## 2022-03-11 NOTE — Care Management (Signed)
CM attempted to see Patient in room for Initial Assessment. Patient was receiving breathing treatment- CM to return to re-attempt. Choice list for recommended Home Health on CM desk   TOC will continue to follow patient for any additional discharge needs

## 2022-03-11 NOTE — Progress Notes (Signed)
SATURATION QUALIFICATIONS: (This note is used to comply with regulatory documentation for home oxygen)  Patient Saturations on Room Air at Rest = 91%  Patient Saturations on Room Air while Ambulating = 85%  Patient Saturations on 2 Liters of oxygen while Ambulating = 94%  Please briefly explain why patient needs home oxygen: Pt requires supplemental 02 to maintain 02 saturation >90% with activity.   Marisa Severin, PT, DPT Acute Rehabilitation Services Secure chat preferred Office [phone]

## 2022-03-11 NOTE — Progress Notes (Signed)
Pt already has appt with Kathleen Argue on 1/31-> per Dr. Audie Box, will keep this appt. Appt reminder listed on AVS.

## 2022-03-11 NOTE — Evaluation (Signed)
Physical Therapy Evaluation Patient Details Name: [name] MRN: [id number] DOB: [birth year] Today's Date: 03/11/2022  History of Present Illness  Patient is a 88 y/o female who presents on 1/17 with SOB. Found to have acute respiratory failure secondary to RSV infection. PMH includes NSTEMI, CAD, HTN, COPD, sarcoid bronchiestasis, A-fib, CHF.  Clinical Impression  Patient presents with dyspnea on exertion, generalized weakness, decreased activity tolerance and impaired mobility s/p above. Pt lives at home with her daughter (who works from home) and son in Sports coach (who is retired) and reports using RW for ambulation more recently but able to perform ADLs independently. Today, pt requires Min A for standing and gait training with use of RW for support. Noted to demonstrate fatigue and dyspnea with activity. Sp02 dropped to 85% on RA, donned 2L and able to maintain Sp02 >91% with activity. Will likely need supplemental 02 for home. Will have support of family at home. Recommend use of RW at home for safe mobility and to decrease fall risk. Will follow acutely to maximize independence and mobility prior to return home.         Recommendations for follow up therapy are one component of a multi-disciplinary discharge planning process, led by the attending physician.  Recommendations may be updated based on patient status, additional functional criteria and insurance authorization.  Follow Up Recommendations Home health PT      Assistance Recommended at Discharge Intermittent Supervision/Assistance  Patient can return home with the following  A little help with walking and/or transfers;A little help with bathing/dressing/bathroom;Assist for transportation;Assistance with cooking/housework;Direct supervision/assist for medications management;Direct supervision/assist for financial management    Equipment Recommendations None recommended by PT  Recommendations for Other Services       Functional  Status Assessment Patient has had a recent decline in their functional status and demonstrates the ability to make significant improvements in function in a reasonable and predictable amount of time.     Precautions / Restrictions Precautions Precautions: Fall;Other (comment) Precaution Comments: watch 02 Restrictions Weight Bearing Restrictions: No      Mobility  Bed Mobility Overal bed mobility: Needs Assistance Bed Mobility: Supine to Sit     Supine to sit: Supervision, HOB elevated     General bed mobility comments: increased time, no assist. MIld dizziness which resolved.    Transfers Overall transfer level: Needs assistance Equipment used: Rolling walker (2 wheels) Transfers: Sit to/from Stand Sit to Stand: Min assist           General transfer comment: Min A to power to standing with cues for hand placement/technique, trransferred to chair post ambulation.    Ambulation/Gait Ambulation/Gait assistance: Min assist Gait Distance (Feet): 60 Feet Assistive device: Rolling walker (2 wheels) Gait Pattern/deviations: Step-through pattern, Decreased stride length, Trunk flexed Gait velocity: decreased Gait velocity interpretation: <1.31 ft/sec, indicative of household ambulator   General Gait Details: Slow, mildly unsteady gait with cues for RW management. Some difficulty with turns, 1 standing rest break.  2-3/4 DOE. Sp02 dropped to 85% on RA, donned 2L and able to maintain Sp02 >91% with activity.  Stairs            Wheelchair Mobility    Modified Rankin (Stroke Patients Only)       Balance Overall balance assessment: Needs assistance Sitting-balance support: Feet supported, No upper extremity supported Sitting balance-Leahy Scale: Good     Standing balance support: During functional activity Standing balance-Leahy Scale: Poor Standing balance comment: Requires UE support in standing  Pertinent Vitals/Pain  Pain Assessment Pain Assessment: No/denies pain    Home Living Family/patient expects to be discharged to:: Private residence Living Arrangements: Children Available Help at Discharge: Family;Available 24 hours/day Type of Home: House Home Access: Stairs to enter   [employer] of Steps: 1   Home Layout: Two level;Able to live on main level with bedroom/bathroom Home Equipment: Rolling Walker (2 wheels);BSC/3in1;Shower seat Additional Comments: daughter works from home    Prior Function Prior Level of Function : Independent/Modified Independent             Mobility Comments: Uses RW as needed esp more recently, No falls in last 6 months ADLs Comments: independent, does a little cooking. Daughter manages medications     Hand Dominance   Dominant Hand: Right    Extremity/Trunk Assessment   Upper Extremity Assessment Upper Extremity Assessment: Defer to OT evaluation    Lower Extremity Assessment Lower Extremity Assessment: Generalized weakness (but functional)    Cervical / Trunk Assessment Cervical / Trunk Assessment: Kyphotic  Communication   Communication: No difficulties  Cognition Arousal/Alertness: Awake/alert Behavior During Therapy: WFL for tasks assessed/performed Overall Cognitive Status: No family/caregiver present to determine baseline cognitive functioning                                 General Comments: WFL for basic mobility tasks, might need further assessment per notes        General Comments General comments (skin integrity, edema, etc.): Sp02 dropped to 85% on RA, donned 2L and able to maintain Sp02 >91% with activity.    Exercises     Assessment/Plan    PT Assessment Patient needs continued PT services  PT Problem List Decreased strength;Decreased mobility;Decreased balance;Decreased knowledge of use of DME;Decreased activity tolerance;Cardiopulmonary status limiting activity       PT Treatment Interventions  Therapeutic activities;Gait training;Therapeutic exercise;Patient/family education;Balance training;Functional mobility training;DME instruction    PT Goals (Current goals can be found in the Care Plan section)  Acute Rehab PT Goals Patient Stated Goal: to be able to breathe, get off 02 and go home PT Goal Formulation: With patient Time For Goal Achievement: 03/25/22 Potential to Achieve Goals: Good    Frequency Min 3X/week     Co-evaluation               AM-PAC PT "6 Clicks" Mobility  Outcome Measure Help needed turning from your back to your side while in a flat bed without using bedrails?: A Little Help needed moving from lying on your back to sitting on the side of a flat bed without using bedrails?: A Little Help needed moving to and from a bed to a chair (including a wheelchair)?: A Little Help needed standing up from a chair using your arms (e.g., wheelchair or bedside chair)?: A Little Help needed to walk in hospital room?: A Little Help needed climbing 3-5 steps with a railing? : A Lot 6 Click Score: 17    End of Session Equipment Utilized During Treatment: Oxygen;Gait belt Activity Tolerance: Treatment limited secondary to medical complications (Comment);Patient tolerated treatment well (drop in Sp02) Patient left: in chair;with call bell/phone within reach;with chair alarm set Nurse Communication: Mobility status PT Visit Diagnosis: Muscle weakness (generalized) (M62.81);Difficulty in walking, not elsewhere classified (R26.2)    Time: 9233-0076 PT Time Calculation (min) (ACUTE ONLY): 26 min   Charges:   PT Evaluation $PT Eval Moderate Complexity: 1 Mod PT Treatments $  Gait Training: 8-22 mins        Zettie Cooley, DPT Acute Rehabilitation Services Secure chat preferred Office [place] 03/11/2022, 2:44 PM

## 2022-03-11 NOTE — Progress Notes (Signed)
Cardiology Progress Note  Patient ID: SAKEENA TEALL MRN: [id number] DOB: [birth year] Date of Encounter: 03/11/2022  Primary Cardiologist: Sherren Mocha, MD  Subjective   Chief Complaint: SOB  HPI: Still with cough and congestion.  Maintaining sinus rhythm.  ROS:  All other ROS reviewed and negative. Pertinent positives noted in the HPI.     Inpatient Medications  Scheduled Meds:  amiodarone  200 mg Oral BID   Followed by   Derrill Memo ON 03/18/2022] amiodarone  200 mg Oral BID   apixaban  2.5 mg Oral BID   aspirin  81 mg Oral Daily   umeclidinium bromide  1 puff Inhalation Daily   And   fluticasone furoate-vilanterol  1 puff Inhalation Daily   ipratropium  0.5 mg Nebulization Q6H   metoprolol tartrate  25 mg Oral BID   pantoprazole  40 mg Oral Daily   predniSONE  40 mg Oral Q breakfast   sodium chloride flush  3 mL Intravenous Q12H   Continuous Infusions:  PRN Meds: acetaminophen **OR** acetaminophen, albuterol, polyethylene glycol   Vital Signs   Vitals:   03/11/22 0227 03/11/22 0420 03/11/22 0600 03/11/22 0829  BP: (!) 140/81 (!) 170/94 (!) 178/66 130/83  Pulse: 60 60 64 68  Resp: 20 19 (!) 21 17  Temp: 97.8 F (36.6 C)   (!) 97.3 F (36.3 C)  TempSrc: Oral   Oral  SpO2: 98% 98% 97% 97%    Intake/Output Summary (Last 24 hours) at 03/11/2022 0857 Last data filed at 03/10/2022 2214 Gross per 24 hour  Intake 123 ml  Output --  Net 123 ml      03/04/2022    2:29 PM 10/22/2021    4:42 PM 10/07/2021    4:39 PM  Last 3 Weights  Weight (lbs) 127 lb 6.4 oz 125 lb 3.2 oz 125 lb 3.2 oz  Weight (kg) 57.788 kg 56.79 kg 56.79 kg      Telemetry  Overnight telemetry shows sinus rhythm 60 to 70 bpm, which I personally reviewed.   Physical Exam   Vitals:   03/11/22 0227 03/11/22 0420 03/11/22 0600 03/11/22 0829  BP: (!) 140/81 (!) 170/94 (!) 178/66 130/83  Pulse: 60 60 64 68  Resp: 20 19 (!) 21 17  Temp: 97.8 F (36.6 C)   (!) 97.3 F (36.3 C)  TempSrc: Oral    Oral  SpO2: 98% 98% 97% 97%    Intake/Output Summary (Last 24 hours) at 03/11/2022 0857 Last data filed at 03/10/2022 2214 Gross per 24 hour  Intake 123 ml  Output --  Net 123 ml       03/04/2022    2:29 PM 10/22/2021    4:42 PM 10/07/2021    4:39 PM  Last 3 Weights  Weight (lbs) 127 lb 6.4 oz 125 lb 3.2 oz 125 lb 3.2 oz  Weight (kg) 57.788 kg 56.79 kg 56.79 kg    There is no height or weight on file to calculate BMI.  General: Ill-appearing, cough noted Head: Atraumatic, normal size  Eyes: PEERLA, EOMI  Neck: Supple, no JVD Endocrine: No thryomegaly Cardiac: Normal S1, S2; RRR; no murmurs, rubs, or gallops Lungs: Wheezing rhonchi noted bilaterally Abd: Soft, nontender, no hepatomegaly  Ext: No edema, pulses 2+ Musculoskeletal: No deformities, BUE and BLE strength normal and equal Skin: Warm and dry, no rashes   Neuro: Alert and oriented to person, place, time, and situation, CNII-XII grossly intact, no focal deficits  Psych: Normal mood and affect  Labs  High Sensitivity Troponin:  No results for input(s): "TROPONINIHS" in the last 720 hours.   Cardiac EnzymesNo results for input(s): "TROPONINI" in the last 168 hours. No results for input(s): "TROPIPOC" in the last 168 hours.  Chemistry Recent Labs  Lab 03/09/22 1325 03/10/22 0400 03/11/22 0535  NA 135 134* 137  K 3.5 3.5 4.2  CL 103 102 102  CO2 19* 23 25  GLUCOSE 123* 188* 85  BUN 26* 30* 31*  CREATININE 1.10* 1.13* 0.95  CALCIUM 8.8* 8.5* 8.5*  PROT 5.9* 5.1*  --   ALBUMIN 3.3* 2.8*  --   AST 24 15  --   ALT 15 14  --   ALKPHOS 78 64  --   BILITOT 0.5 0.6  --   GFRNONAA 49* 48* 59*  ANIONGAP '13 9 10    '$ Hematology Recent Labs  Lab 03/09/22 1325 03/10/22 0400 03/11/22 0535  WBC 15.5* 11.7* 17.7*  RBC 4.87 4.14 4.27  HGB 13.6 11.8* 12.5  HCT 42.7 35.8* 37.3  MCV 87.7 86.5 87.4  MCH 27.9 28.5 29.3  MCHC 31.9 33.0 33.5  RDW 16.4* 16.5* 16.6*  PLT 279 229 254   BNP Recent Labs  Lab 03/09/22 1325   BNP 254.2*    DDimer No results for input(s): "DDIMER" in the last 168 hours.   Radiology  ECHOCARDIOGRAM COMPLETE  Result Date: 03/10/2022    ECHOCARDIOGRAM REPORT   Patient Name:   HERMIONE HAVLICEK Date of Exam: 03/10/2022 Medical Rec #:  [id number]        Height:       63.0 in Accession #:    [id number]       Weight:       127.4 lb Date of Birth:  [birth year]        BSA:          1.596 m Patient Age:    88 years         BP:           139/59 mmHg Patient Gender: F                HR:           72 bpm. Exam Location:  Inpatient Procedure: 2D Echo, Color Doppler and Cardiac Doppler Indications:    Atrial Fibrillation I48.91  History:        Patient has prior history of Echocardiogram examinations, most                 recent 08/08/2021. CAD, COPD, Arrythmias:Tachycardia and Atrial                 Fibrillation, Signs/Symptoms:Dyspnea; Risk Factors:Hypertension                 and Dyslipidemia.  Sonographer:    Ronny Flurry Referring Phys: [id number] [place]  1. Left ventricular ejection fraction, by estimation, is 60 to 65%. The left ventricle has normal function. The left ventricle has no regional wall motion abnormalities. There is mild concentric left ventricular hypertrophy. Left ventricular diastolic parameters are consistent with Grade II diastolic dysfunction (pseudonormalization).  2. Right ventricular systolic function is normal. The right ventricular size is normal. There is mildly elevated pulmonary artery systolic pressure.  3. Trivial mitral valve regurgitation.  4. The aortic valve is grossly normal. Aortic valve regurgitation is trivial.  5. The inferior vena cava is normal in size with greater than 50% respiratory variability, suggesting right atrial pressure  of 3 mmHg. Comparison(s): No significant change from prior study. FINDINGS  Left Ventricle: Left ventricular ejection fraction, by estimation, is 60 to 65%. The left ventricle has normal function. The left ventricle  has no regional wall motion abnormalities. The left ventricular internal cavity size was normal in size. There is  mild concentric left ventricular hypertrophy. Left ventricular diastolic parameters are consistent with Grade II diastolic dysfunction (pseudonormalization). Right Ventricle: The right ventricular size is normal. Right ventricular systolic function is normal. There is mildly elevated pulmonary artery systolic pressure. The tricuspid regurgitant velocity is 2.88 m/s, and with an assumed right atrial pressure of 3 mmHg, the estimated right ventricular systolic pressure is 38.1 mmHg. Left Atrium: Left atrial size was normal in size. Right Atrium: Right atrial size was normal in size. Pericardium: There is no evidence of pericardial effusion. Mitral Valve: Trivial mitral valve regurgitation. Tricuspid Valve: Tricuspid valve regurgitation is mild. Aortic Valve: The aortic valve is grossly normal. Aortic valve regurgitation is trivial. Aortic valve mean gradient measures 4.0 mmHg. Aortic valve peak gradient measures 7.3 mmHg. Aortic valve area, by VTI measures 2.17 cm. Pulmonic Valve: Pulmonic valve regurgitation is not visualized. Aorta: The aortic root and ascending aorta are structurally normal, with no evidence of dilitation. Venous: The inferior vena cava is normal in size with greater than 50% respiratory variability, suggesting right atrial pressure of 3 mmHg. IAS/Shunts: The interatrial septum was not well visualized.  LEFT VENTRICLE PLAX 2D LVOT diam:     2.00 cm   Diastology LV SV:         65        LV e' medial:    6.85 cm/s LV SV Index:   41        LV E/e' medial:  13.5 LVOT Area:     3.14 cm  LV e' lateral:   7.72 cm/s                          LV E/e' lateral: 12.0                           3D Volume EF:                          3D EF:        62 %                          LV EDV:       136 ml                          LV ESV:       52 ml                          LV SV:        84 ml RIGHT  VENTRICLE RV Basal diam:  3.90 cm RV Mid diam:    2.10 cm RV S prime:     20.00 cm/s TAPSE (M-mode): 2.3 cm LEFT ATRIUM             Index        RIGHT ATRIUM           Index LA Vol (A2C):   32.7 ml 20.48 ml/m  RA Area:  12.80 cm LA Vol (A4C):   49.5 ml 31.01 ml/m  RA Volume:   30.00 ml  18.79 ml/m LA Biplane Vol: 43.2 ml 27.06 ml/m  AORTIC VALVE AV Area (Vmax):    2.36 cm AV Area (Vmean):   2.19 cm AV Area (VTI):     2.17 cm AV Vmax:           135.00 cm/s AV Vmean:          92.300 cm/s AV VTI:            0.301 m AV Peak Grad:      7.3 mmHg AV Mean Grad:      4.0 mmHg LVOT Vmax:         101.23 cm/s LVOT Vmean:        64.467 cm/s LVOT VTI:          0.208 m LVOT/AV VTI ratio: 0.69  AORTA Ao Root diam: 2.70 cm Ao Asc diam:  2.20 cm MITRAL VALVE               TRICUSPID VALVE MV Area (PHT): 3.85 cm    TR Peak grad:   33.2 mmHg MV Decel Time: 197 msec    TR Vmax:        288.00 cm/s MV E velocity: 92.70 cm/s MV A velocity: 88.60 cm/s  SHUNTS MV E/A ratio:  1.05        Systemic VTI:  0.21 m                            Systemic Diam: 2.00 cm Phineas Inches Electronically signed by Phineas Inches Signature Date/Time: 03/10/2022/10:38:36 AM    Final    DG Chest Port 1 View  Result Date: 03/09/2022 CLINICAL DATA:  Short of breath EXAM: PORTABLE CHEST 1 VIEW COMPARISON:  None Available. FINDINGS: Lungs are hyperinflated. Chronic bronchitic markings. No effusion, infiltrate or pneumothorax. LEFT shoulder arthroplasty. IMPRESSION: Hyperinflated lungs and chronic bronchitic change. No acute findings. Electronically Signed   By: Suzy Bouchard M.D.   On: 03/09/2022 14:23    Cardiac Studies  TTE 03/10/2022  1. Left ventricular ejection fraction, by estimation, is 60 to 65%. The  left ventricle has normal function. The left ventricle has no regional  wall motion abnormalities. There is mild concentric left ventricular  hypertrophy. Left ventricular diastolic  parameters are consistent with Grade II diastolic  dysfunction  (pseudonormalization).   2. Right ventricular systolic function is normal. The right ventricular  size is normal. There is mildly elevated pulmonary artery systolic  pressure.   3. Trivial mitral valve regurgitation.   4. The aortic valve is grossly normal. Aortic valve regurgitation is  trivial.   5. The inferior vena cava is normal in size with greater than 50%  respiratory variability, suggesting right atrial pressure of 3 mmHg.   Patient Profile  88 year old female with history of dementia, severe COPD (bronchiectasis), pulmonary sarcoidosis, nonobstructive CAD, paroxysmal atrial fibrillation admitted on 03/09/2022 with acute hypoxic respiratory failure secondary to RSV infection. Cardiology consulted for atrial fibrillation with RVR.    Assessment & Plan   # Paroxysmal atrial fibrillation -Maintaining sinus rhythm on amiodarone.  Was placed on amiodarone due to soft blood pressure and very difficult to control heart rates. -She does have significant lung disease but I believe her lung disease is driving her A-fib.  We will plan for a short term course of amiodarone.  Transition to 200  mg twice daily for 7 days.  She will then complete 200 mg for 21 days then stop.  Again this is a short-term use of this medication.   -Continue metoprolol tartrate 25 mg twice daily. -Continue Eliquis 2.5 mg twice daily. -We will arrange outpatient follow-up.  # Chronic HFpEF -Euvolemic.  Continue home medications.  # RSV # Severe COPD/bronchiectasis/pulmonary sarcoidosis -Per hospital medicine team  [place] will sign off.   Medication Recommendations: Medications as above Other recommendations (labs, testing, etc): None Follow up as an outpatient: We will arrange outpatient follow-up in 4 to 6 weeks  For questions or updates, please contact [place] Please consult [email or web address] for contact info under   Signed, [place] T. Audie Box, MD, [place]  03/11/2022 8:57 AM

## 2022-03-11 NOTE — Progress Notes (Signed)
Progress Note    MIIA BLANKS   GQQ:[id number]  DOB: 1934-08-23  DOA: 03/09/2022     2 PCP: [name], MD  Initial CC: SOB  Hospital Course: Ms. Avis is an 88 yo female with PMH A-fib, PSVT, pulmonary sarcoid, scoliosis, HLD, HTN, COPD, bronchiectasis, CAD, low back pain, anemia, GERD, diastolic CHF who presented with shortness of breath. On workup in the ER she was found to be in A-fib with RVR and hypoxic initially requiring BiPAP which was able to be weaned down to 6 L. Viral swab testing also was positive for RSV. Cardiology was consulted and she was started on an amiodarone drip due to soft blood pressures.  Interval History:  No events overnight.  Still feeling deconditioned and gets out of breath with minimal movement.  Oxygen further weaned when seen this morning.  Assessment and Plan:  COPD exacerbation Acute respiratory failure with hypoxia History of bronchiectasis RSV infection > Patient presenting with progressive shortness of breath and fatigue for 1-2 days PTA > Noted to have new oxygen requirement and significant tachycardia > Positive for RSV.  Did have leukocytosis but chest x-ray stable.  At this point leukocytosis is presumed reactive > Atrial fibrillation with RVR may be contributing to hypoxia some as below > Received IV Solu-Medrol, nebulizer treatments and placed on oxygen around by EMS.  Was temporarily on BiPAP in the ED but has improved. -Continue breathing treatments, steroids - Wean oxygen as able; needs ambulating O2 test   Atrial fibrillation with RVR History of PSVT > Patient also noted to be in A-fib with RVR.  Initial rates as high as the 180s to 200s via EMS.  In the ED as high as 160s to 170s.  Received dose of IV metoprolol and then IV diltiazem and started on IV diltiazem drip. > Patient became hypotensive and with consultation of cardiology was switched to one-time dose of p.o. digoxin and then scheduled p.o. metoprolol.   -  patient ultimately started on amiodarone drip for rate control - patient noted to be converted back to NSR this morning - appreciate cardiology assistance; transitioned to short course of oral Amio -Echo ordered on admission: EF 60 to 65%, no RWMA, mild LVH, grade 2 DD -TSH normal, 1.031 - Continue Eliquis   Hyperlipidemia Carotid artery disease Coronary artery disease - Continue home aspirin -Continue metoprolol   Hypertension > Low normal blood pressure here with treatments for A-fib with RVR - Currently receiving metoprolol as above   Chronic diastolic CHF > Last echo was in June of last year with EF 55-60%, G2 DD, normal RV function. -No significant changes noted on repeat echo on admission   GERD - Continue home PPI   History of pulmonary sarcoid - follows with Dr. [name]; last CT June 2023 noted stable -Short course of amiodarone planned per cardiology in context of this as well   Old records reviewed in assessment of this patient  Antimicrobials:   DVT prophylaxis:  apixaban (ELIQUIS) tablet 2.5 mg Start: 03/09/22 2200 apixaban (ELIQUIS) tablet 2.5 mg   Code Status:   Code Status: DNR  Mobility Assessment (last 72 hours)     Mobility Assessment   No documentation.           Barriers to discharge:  Disposition Plan:  Home Status is: Inpt  Objective: Blood pressure 130/83, pulse 68, temperature (!) 97.3 F (36.3 C), temperature source Oral, resp. rate 17, last menstrual period 02/22/1983, SpO2 94 %.  Examination:  Physical Exam Constitutional:      General: She is not in acute distress.    Appearance: Normal appearance.  HENT:     Head: Normocephalic and atraumatic.     Mouth/Throat:     Mouth: Mucous membranes are moist.  Eyes:     Extraocular Movements: Extraocular movements intact.  Cardiovascular:     Rate and Rhythm: Normal rate and regular rhythm.  Pulmonary:     Effort: Pulmonary effort is normal. No respiratory distress.     Breath  sounds: Wheezing and rhonchi present.  Abdominal:     General: Bowel sounds are normal. There is no distension.     Palpations: Abdomen is soft.     Tenderness: There is no abdominal tenderness.  Musculoskeletal:        General: No swelling. Normal range of motion.     Cervical back: Normal range of motion and neck supple.  Skin:    General: Skin is warm and dry.  Neurological:     General: No focal deficit present.     Mental Status: She is alert.  Psychiatric:        Mood and Affect: Mood normal.        Behavior: Behavior normal.      Consultants:  Cardiology  Procedures:    Data Reviewed: Results for orders placed or performed during the hospital encounter of 03/09/22 (from the past 24 hour(s))  Basic metabolic panel     Status: Abnormal   Collection Time: 03/11/22  5:35 AM  Result Value Ref Range   Sodium 137 135 - 145 mmol/L   Potassium 4.2 3.5 - 5.1 mmol/L   Chloride 102 98 - 111 mmol/L   CO2 25 22 - 32 mmol/L   Glucose, Bld 85 70 - 99 mg/dL   BUN 31 (H) 8 - 23 mg/dL   Creatinine, Ser 0.95 0.44 - 1.00 mg/dL   Calcium 8.5 (L) 8.9 - 10.3 mg/dL   GFR, Estimated 59 (L) >60 mL/min   Anion gap 10 5 - 15  CBC with Differential/Platelet     Status: Abnormal   Collection Time: 03/11/22  5:35 AM  Result Value Ref Range   WBC 17.7 (H) 4.0 - 10.5 K/uL   RBC 4.27 3.87 - 5.11 MIL/uL   Hemoglobin 12.5 12.0 - 15.0 g/dL   HCT 37.3 36.0 - 46.0 %   MCV 87.4 80.0 - 100.0 fL   MCH 29.3 26.0 - 34.0 pg   MCHC 33.5 30.0 - 36.0 g/dL   RDW 16.6 (H) 11.5 - 15.5 %   Platelets 254 150 - 400 K/uL   nRBC 0.0 0.0 - 0.2 %   Neutrophils Relative % 87 %   Neutro Abs 15.4 (H) 1.7 - 7.7 K/uL   Lymphocytes Relative 5 %   Lymphs Abs 0.9 0.7 - 4.0 K/uL   Monocytes Relative 5 %   Monocytes Absolute 0.8 0.1 - 1.0 K/uL   Eosinophils Relative 1 %   Eosinophils Absolute 0.1 0.0 - 0.5 K/uL   Basophils Relative 0 %   Basophils Absolute 0.0 0.0 - 0.1 K/uL   Immature Granulocytes 2 %   Abs  Immature Granulocytes 0.35 (H) 0.00 - 0.07 K/uL  Magnesium     Status: None   Collection Time: 03/11/22  5:35 AM  Result Value Ref Range   Magnesium 2.1 1.7 - 2.4 mg/dL    I have reviewed pertinent nursing notes, vitals, labs, and images as necessary. I have ordered labwork to  follow up on as indicated.  I have reviewed the last notes from staff over past 24 hours. I have discussed patient's care plan and test results with nursing staff, CM/SW, and other staff as appropriate.  Time spent: Greater than 50% of the 55 minute visit was spent in counseling/coordination of care for the patient as laid out in the A&P.   LOS: 2 days   Dwyane Dee, MD [facility] 03/11/2022, 1:24 PM

## 2022-03-11 NOTE — Progress Notes (Signed)
Pt refuses to wear BIPAP QHS tolerating 2L [place] and stable

## 2022-03-12 DIAGNOSIS — J121 Respiratory syncytial virus pneumonia: Secondary | ICD-10-CM | POA: Diagnosis not present

## 2022-03-12 DIAGNOSIS — J9601 Acute respiratory failure with hypoxia: Secondary | ICD-10-CM | POA: Diagnosis not present

## 2022-03-12 DIAGNOSIS — I480 Paroxysmal atrial fibrillation: Secondary | ICD-10-CM | POA: Diagnosis not present

## 2022-03-12 DIAGNOSIS — J441 Chronic obstructive pulmonary disease with (acute) exacerbation: Secondary | ICD-10-CM | POA: Diagnosis not present

## 2022-03-12 LAB — CBC WITH DIFFERENTIAL/PLATELET
Abs Immature Granulocytes: 0.29 10*3/uL — ABNORMAL HIGH (ref 0.00–0.07)
Basophils Absolute: 0 10*3/uL (ref 0.0–0.1)
Basophils Relative: 0 %
Eosinophils Absolute: 0 10*3/uL (ref 0.0–0.5)
Eosinophils Relative: 0 %
HCT: 36.8 % (ref 36.0–46.0)
Hemoglobin: 11.7 g/dL — ABNORMAL LOW (ref 12.0–15.0)
Immature Granulocytes: 2 %
Lymphocytes Relative: 10 %
Lymphs Abs: 1.4 10*3/uL (ref 0.7–4.0)
MCH: 27.9 pg (ref 26.0–34.0)
MCHC: 31.8 g/dL (ref 30.0–36.0)
MCV: 87.8 fL (ref 80.0–100.0)
Monocytes Absolute: 0.9 10*3/uL (ref 0.1–1.0)
Monocytes Relative: 6 %
Neutro Abs: 11.6 10*3/uL — ABNORMAL HIGH (ref 1.7–7.7)
Neutrophils Relative %: 82 %
Platelets: 248 10*3/uL (ref 150–400)
RBC: 4.19 MIL/uL (ref 3.87–5.11)
RDW: 16.5 % — ABNORMAL HIGH (ref 11.5–15.5)
WBC: 14.2 10*3/uL — ABNORMAL HIGH (ref 4.0–10.5)
nRBC: 0 % (ref 0.0–0.2)

## 2022-03-12 LAB — URINALYSIS, ROUTINE W REFLEX MICROSCOPIC
Bacteria, UA: NONE SEEN
Bilirubin Urine: NEGATIVE
Glucose, UA: NEGATIVE mg/dL
Hgb urine dipstick: NEGATIVE
Ketones, ur: NEGATIVE mg/dL
Leukocytes,Ua: NEGATIVE
Nitrite: NEGATIVE
Protein, ur: NEGATIVE mg/dL
Specific Gravity, Urine: 1.025 (ref 1.005–1.030)
pH: 5 (ref 5.0–8.0)

## 2022-03-12 LAB — BASIC METABOLIC PANEL
Anion gap: 9 (ref 5–15)
BUN: 32 mg/dL — ABNORMAL HIGH (ref 8–23)
CO2: 23 mmol/L (ref 22–32)
Calcium: 8.4 mg/dL — ABNORMAL LOW (ref 8.9–10.3)
Chloride: 106 mmol/L (ref 98–111)
Creatinine, Ser: 0.89 mg/dL (ref 0.44–1.00)
GFR, Estimated: >60 mL/min (ref >60)
Glucose, Bld: 76 mg/dL (ref 70–99)
Potassium: 3.9 mmol/L (ref 3.5–5.1)
Sodium: 138 mmol/L (ref 135–145)

## 2022-03-12 LAB — MAGNESIUM: Magnesium: 2.1 mg/dL (ref 1.7–2.4)

## 2022-03-12 MED ORDER — METHYLPREDNISOLONE SODIUM SUCC 40 MG IJ SOLR
40.0000 mg | Freq: Two times a day (BID) | INTRAMUSCULAR | Status: DC
  Administered 2022-03-12 – 2022-03-14 (×5): 40 mg via INTRAVENOUS
  Filled 2022-03-12 (×5): qty 1

## 2022-03-12 MED ORDER — GUAIFENESIN ER 600 MG PO TB12
1200.0000 mg | ORAL_TABLET | Freq: Two times a day (BID) | ORAL | Status: DC
  Administered 2022-03-12 – 2022-03-18 (×13): 1200 mg via ORAL
  Filled 2022-03-12 (×13): qty 2

## 2022-03-12 NOTE — Progress Notes (Signed)
Progress Note    [name]   GNF:[id number]  DOB: Dec 12, 1934  DOA: 03/09/2022     3 PCP: [name], MD  Initial CC: SOB  Hospital Course: [name] is an 88 yo female with PMH A-fib, PSVT, pulmonary sarcoid, scoliosis, HLD, HTN, COPD, bronchiectasis, CAD, low back pain, anemia, GERD, diastolic CHF who presented with shortness of breath. On workup in the ER she was found to be in A-fib with RVR and hypoxic initially requiring BiPAP which was able to be weaned down to 6 L. Viral swab testing also was positive for RSV. Cardiology was consulted and she was started on an amiodarone drip due to soft blood pressures.  Interval History:  No events overnight.  Feels weaker today and also was unable to ambulate as much with PT.  Remains on 2 L oxygen. Clarified antibiotic use at home for UTI; she did not complete Macrobid course and she still endorses some dysuria. Updated daughter bedside this afternoon as well and answered questions.  Assessment and Plan:  COPD exacerbation Acute respiratory failure with hypoxia History of bronchiectasis RSV infection > Patient presenting with progressive shortness of breath and fatigue for 1-2 days PTA > Noted to have new oxygen requirement and significant tachycardia > Positive for RSV.  Did have leukocytosis but chest x-ray stable.  At this point leukocytosis is presumed reactive > Atrial fibrillation with RVR may be contributing to hypoxia some as below -Continue breathing treatments, steroids - Wean oxygen as able; suspect she is going to need short term (at least) O2 at discharge given underlying chronic lung disease   Atrial fibrillation with RVR History of PSVT > Patient also noted to be in A-fib with RVR.  Initial rates as high as the 180s to 200s via EMS.  In the ED as high as 160s to 170s.  Received dose of IV metoprolol and then IV diltiazem and started on IV diltiazem drip. > Patient became hypotensive and with consultation  of cardiology was switched to one-time dose of p.o. digoxin and then scheduled p.o. metoprolol.   - patient ultimately started on amiodarone drip for rate control - patient noted to be converted back to NSR this morning - appreciate cardiology assistance; transitioned to short course of oral Amio -Echo ordered on admission: EF 60 to 65%, no RWMA, mild LVH, grade 2 DD -TSH normal, 1.031 - Continue Eliquis   Hyperlipidemia Carotid artery disease Coronary artery disease - Continue home aspirin -Continue metoprolol   Hypertension > Low normal blood pressure here with treatments for A-fib with RVR - Currently receiving metoprolol as above   Chronic diastolic CHF > Last echo was in June of last year with EF 55-60%, G2 DD, normal RV function. -No significant changes noted on repeat echo on admission   GERD - Continue home PPI   History of pulmonary sarcoid - follows with Dr. [name]; last CT June 2023 noted stable -Short course of amiodarone planned per cardiology in context of this as well   Old records reviewed in assessment of this patient  Antimicrobials:   DVT prophylaxis:  apixaban (ELIQUIS) tablet 2.5 mg Start: 03/09/22 2200 apixaban (ELIQUIS) tablet 2.5 mg   Code Status:   Code Status: DNR  Mobility Assessment (last 72 hours)     Mobility Assessment     Row Name 03/12/22 1200 03/12/22 0021 03/11/22 2000 03/11/22 1442 03/11/22 0726   Does patient have an order for bedrest or is patient medically unstable -- No - Continue assessment  No - Continue assessment -- No - Continue assessment   What is the highest level of mobility based on the progressive mobility assessment? Level 5 (Walks with assist in room/hall) - Balance while stepping forward/back and can walk in room with assist - Complete Level 5 (Walks with assist in room/hall) - Balance while stepping forward/back and can walk in room with assist - Complete Level 5 (Walks with assist in room/hall) - Balance while  stepping forward/back and can walk in room with assist - Complete Level 5 (Walks with assist in room/hall) - Balance while stepping forward/back and can walk in room with assist - Complete Level 5 (Walks with assist in room/hall) - Balance while stepping forward/back and can walk in room with assist - Complete            Barriers to discharge:  Disposition Plan:  Home Status is: Inpt  Objective: Blood pressure (!) 122/50, pulse 66, temperature 98 F (36.7 C), temperature source Oral, resp. rate 20, last menstrual period 02/22/1983, SpO2 97 %.  Examination:  Physical Exam Constitutional:      General: She is not in acute distress.    Appearance: Normal appearance.  HENT:     Head: Normocephalic and atraumatic.     Mouth/Throat:     Mouth: Mucous membranes are moist.  Eyes:     Extraocular Movements: Extraocular movements intact.  Cardiovascular:     Rate and Rhythm: Normal rate and regular rhythm.  Pulmonary:     Effort: Pulmonary effort is normal. No respiratory distress.     Breath sounds: Wheezing and rhonchi present.  Abdominal:     General: Bowel sounds are normal. There is no distension.     Palpations: Abdomen is soft.     Tenderness: There is no abdominal tenderness.  Musculoskeletal:        General: No swelling. Normal range of motion.     Cervical back: Normal range of motion and neck supple.  Skin:    General: Skin is warm and dry.  Neurological:     General: No focal deficit present.     Mental Status: She is alert.  Psychiatric:        Mood and Affect: Mood normal.        Behavior: Behavior normal.      Consultants:  Cardiology  Procedures:    Data Reviewed: Results for orders placed or performed during the hospital encounter of 03/09/22 (from the past 24 hour(s))  Basic metabolic panel     Status: Abnormal   Collection Time: 03/12/22  4:39 AM  Result Value Ref Range   Sodium 138 135 - 145 mmol/L   Potassium 3.9 3.5 - 5.1 mmol/L   Chloride 106  98 - 111 mmol/L   CO2 23 22 - 32 mmol/L   Glucose, Bld 76 70 - 99 mg/dL   BUN 32 (H) 8 - 23 mg/dL   Creatinine, Ser 0.89 0.44 - 1.00 mg/dL   Calcium 8.4 (L) 8.9 - 10.3 mg/dL   GFR, Estimated >60 >60 mL/min   Anion gap 9 5 - 15  CBC with Differential/Platelet     Status: Abnormal   Collection Time: 03/12/22  4:39 AM  Result Value Ref Range   WBC 14.2 (H) 4.0 - 10.5 K/uL   RBC 4.19 3.87 - 5.11 MIL/uL   Hemoglobin 11.7 (L) 12.0 - 15.0 g/dL   HCT 36.8 36.0 - 46.0 %   MCV 87.8 80.0 - 100.0 fL   MCH 27.9  26.0 - 34.0 pg   MCHC 31.8 30.0 - 36.0 g/dL   RDW 16.5 (H) 11.5 - 15.5 %   Platelets 248 150 - 400 K/uL   nRBC 0.0 0.0 - 0.2 %   Neutrophils Relative % 82 %   Neutro Abs 11.6 (H) 1.7 - 7.7 K/uL   Lymphocytes Relative 10 %   Lymphs Abs 1.4 0.7 - 4.0 K/uL   Monocytes Relative 6 %   Monocytes Absolute 0.9 0.1 - 1.0 K/uL   Eosinophils Relative 0 %   Eosinophils Absolute 0.0 0.0 - 0.5 K/uL   Basophils Relative 0 %   Basophils Absolute 0.0 0.0 - 0.1 K/uL   Immature Granulocytes 2 %   Abs Immature Granulocytes 0.29 (H) 0.00 - 0.07 K/uL  Magnesium     Status: None   Collection Time: 03/12/22  4:39 AM  Result Value Ref Range   Magnesium 2.1 1.7 - 2.4 mg/dL  Urinalysis, Routine w reflex microscopic Urine, Clean Catch     Status: Abnormal   Collection Time: 03/12/22  1:19 PM  Result Value Ref Range   Color, Urine YELLOW YELLOW   APPearance HAZY (A) CLEAR   Specific Gravity, Urine 1.025 1.005 - 1.030   pH 5.0 5.0 - 8.0   Glucose, UA NEGATIVE NEGATIVE mg/dL   Hgb urine dipstick NEGATIVE NEGATIVE   Bilirubin Urine NEGATIVE NEGATIVE   Ketones, ur NEGATIVE NEGATIVE mg/dL   Protein, ur NEGATIVE NEGATIVE mg/dL   Nitrite NEGATIVE NEGATIVE   Leukocytes,Ua NEGATIVE NEGATIVE   RBC / HPF 0-5 0 - 5 RBC/hpf   WBC, UA 0-5 0 - 5 WBC/hpf   Bacteria, UA NONE SEEN NONE SEEN   Squamous Epithelial / HPF 0-5 0 - 5 /HPF   Amorphous Crystal PRESENT     I have reviewed pertinent nursing notes, vitals,  labs, and images as necessary. I have ordered labwork to follow up on as indicated.  I have reviewed the last notes from staff over past 24 hours. I have discussed patient's care plan and test results with nursing staff, CM/SW, and other staff as appropriate.  Time spent: Greater than 50% of the 55 minute visit was spent in counseling/coordination of care for the patient as laid out in the A&P.   LOS: 3 days   Dwyane Dee, MD [facility] 03/12/2022, 3:17 PM

## 2022-03-12 NOTE — Plan of Care (Signed)

## 2022-03-12 NOTE — Progress Notes (Signed)
Physical Therapy Treatment Patient Details Name: [name] MRN: [id number] DOB: 1934-06-18 Today's Date: 03/12/2022   History of Present Illness Patient is a 88 y/o female who presents on 1/17 with SOB. Found to have acute respiratory failure secondary to RSV infection. PMH includes NSTEMI, CAD, HTN, COPD, sarcoid bronchiestasis, A-fib, CHF.    PT Comments    Patient seems weaker today compared to physical evaluation completed yesterday. Only tolerated 16 feet of gait in room this morning compared to 60 feet yesterday. SpO2 on 2L supplemental O2 remained around 96% during bout HR in 50s-60s.  Supplemental O2 was on pt forehead when PT entered room with sats at 87%, quickly improved to mid-upper 94B with reapplication at 3L. Patient wishes to return home with family assistance but nervous to leave soon in her current fatigued state. Patient will continue to benefit from skilled physical therapy services to further improve independence with functional mobility.   Recommendations for follow up therapy are one component of a multi-disciplinary discharge planning process, led by the attending physician.  Recommendations may be updated based on patient status, additional functional criteria and insurance authorization.  Follow Up Recommendations  Home health PT     Assistance Recommended at Discharge Intermittent Supervision/Assistance  Patient can return home with the following A little help with walking and/or transfers;A little help with bathing/dressing/bathroom;Assist for transportation;Assistance with cooking/housework;Direct supervision/assist for medications management;Direct supervision/assist for financial management   Equipment Recommendations  None recommended by PT    Recommendations for Other Services       Precautions / Restrictions Precautions Precautions: Fall;Other (comment) Precaution Comments: watch 02 Restrictions Weight Bearing Restrictions: No     Mobility   Bed Mobility Overal bed mobility: Needs Assistance Bed Mobility: Supine to Sit, Sit to Supine     Supine to sit: Supervision, HOB elevated Sit to supine: Supervision   General bed mobility comments: increased time, no assist. supervision for safety    Transfers Overall transfer level: Needs assistance Equipment used: Rolling walker (2 wheels) Transfers: Sit to/from Stand Sit to Stand: Min assist           General transfer comment: Min assist for boost and balance to rise from low bed setting. Stable once upright on RW for support.    Ambulation/Gait Ambulation/Gait assistance: Min guard Gait Distance (Feet): 16 Feet Assistive device: Rolling walker (2 wheels) Gait Pattern/deviations: Step-through pattern, Decreased stride length, Trunk flexed Gait velocity: decreased     General Gait Details: SpO2 95% on 2L supplemental O2. Stable with RW for support however extremely slow. Pt fatigued very quickly and only tolerated short distance in room.   Stairs             Wheelchair Mobility    Modified Rankin (Stroke Patients Only)       Balance Overall balance assessment: Needs assistance Sitting-balance support: Feet supported, No upper extremity supported Sitting balance-Leahy Scale: Good     Standing balance support: During functional activity Standing balance-Leahy Scale: Poor Standing balance comment: Requires UE support in standing                            Cognition Arousal/Alertness: Awake/alert Behavior During Therapy: WFL for tasks assessed/performed Overall Cognitive Status: No family/caregiver present to determine baseline cognitive functioning  Exercises      General Comments General comments (skin integrity, edema, etc.): SpO2 86% when PT entered room, [place] was on forehead. Repositioned in nose and quickly increased to 96% on 3L supplemental o2.      Pertinent  Vitals/Pain Pain Assessment Pain Assessment: No/denies pain    Home Living                          Prior Function            PT Goals (current goals can now be found in the care plan section) Acute Rehab PT Goals Patient Stated Goal: to be able to breathe, get off 02 and go home PT Goal Formulation: With patient Time For Goal Achievement: 03/25/22 Potential to Achieve Goals: Good Progress towards PT goals: Progressing toward goals    Frequency    Min 3X/week      PT Plan Current plan remains appropriate    Co-evaluation              AM-PAC PT "6 Clicks" Mobility   Outcome Measure  Help needed turning from your back to your side while in a flat bed without using bedrails?: A Little Help needed moving from lying on your back to sitting on the side of a flat bed without using bedrails?: A Little Help needed moving to and from a bed to a chair (including a wheelchair)?: A Little Help needed standing up from a chair using your arms (e.g., wheelchair or bedside chair)?: A Little Help needed to walk in hospital room?: A Little Help needed climbing 3-5 steps with a railing? : A Lot 6 Click Score: 17    End of Session Equipment Utilized During Treatment: Oxygen;Gait belt Activity Tolerance: Patient limited by fatigue Patient left: with call bell/phone within reach;in bed;with bed alarm set   PT Visit Diagnosis: Muscle weakness (generalized) (M62.81);Difficulty in walking, not elsewhere classified (R26.2)     Time: 9480-1655 PT Time Calculation (min) (ACUTE ONLY): 21 min  Charges:  $Gait Training: 8-22 mins                     Candie Mile, PT, DPT Physical Therapist Acute Rehabilitation Services [place]    Ellouise Newer 03/12/2022, 12:57 PM

## 2022-03-13 DIAGNOSIS — I480 Paroxysmal atrial fibrillation: Secondary | ICD-10-CM | POA: Diagnosis not present

## 2022-03-13 DIAGNOSIS — J121 Respiratory syncytial virus pneumonia: Secondary | ICD-10-CM | POA: Diagnosis not present

## 2022-03-13 DIAGNOSIS — J9601 Acute respiratory failure with hypoxia: Secondary | ICD-10-CM | POA: Diagnosis not present

## 2022-03-13 LAB — BASIC METABOLIC PANEL
Anion gap: 9 (ref 5–15)
BUN: 30 mg/dL — ABNORMAL HIGH (ref 8–23)
CO2: 21 mmol/L — ABNORMAL LOW (ref 22–32)
Calcium: 8.3 mg/dL — ABNORMAL LOW (ref 8.9–10.3)
Chloride: 105 mmol/L (ref 98–111)
Creatinine, Ser: 0.87 mg/dL (ref 0.44–1.00)
GFR, Estimated: >60 mL/min (ref >60)
Glucose, Bld: 187 mg/dL — ABNORMAL HIGH (ref 70–99)
Potassium: 4.5 mmol/L (ref 3.5–5.1)
Sodium: 135 mmol/L (ref 135–145)

## 2022-03-13 LAB — CBC WITH DIFFERENTIAL/PLATELET
Abs Immature Granulocytes: 0.25 10*3/uL — ABNORMAL HIGH (ref 0.00–0.07)
Basophils Absolute: 0 10*3/uL (ref 0.0–0.1)
Basophils Relative: 0 %
Eosinophils Absolute: 0 10*3/uL (ref 0.0–0.5)
Eosinophils Relative: 0 %
HCT: 35.8 % — ABNORMAL LOW (ref 36.0–46.0)
Hemoglobin: 11.5 g/dL — ABNORMAL LOW (ref 12.0–15.0)
Immature Granulocytes: 2 %
Lymphocytes Relative: 6 %
Lymphs Abs: 0.8 10*3/uL (ref 0.7–4.0)
MCH: 27.9 pg (ref 26.0–34.0)
MCHC: 32.1 g/dL (ref 30.0–36.0)
MCV: 86.9 fL (ref 80.0–100.0)
Monocytes Absolute: 0.2 10*3/uL (ref 0.1–1.0)
Monocytes Relative: 2 %
Neutro Abs: 12.4 10*3/uL — ABNORMAL HIGH (ref 1.7–7.7)
Neutrophils Relative %: 90 %
Platelets: 233 10*3/uL (ref 150–400)
RBC: 4.12 MIL/uL (ref 3.87–5.11)
RDW: 16.3 % — ABNORMAL HIGH (ref 11.5–15.5)
WBC: 13.7 10*3/uL — ABNORMAL HIGH (ref 4.0–10.5)
nRBC: 0 % (ref 0.0–0.2)

## 2022-03-13 LAB — MAGNESIUM: Magnesium: 2.1 mg/dL (ref 1.7–2.4)

## 2022-03-13 MED ORDER — ALUM & MAG HYDROXIDE-SIMETH 200-200-20 MG/5ML PO SUSP
30.0000 mL | ORAL | Status: DC | PRN
  Administered 2022-03-13 – 2022-03-17 (×4): 30 mL via ORAL
  Filled 2022-03-13 (×4): qty 30

## 2022-03-13 MED ORDER — PHENAZOPYRIDINE HCL 200 MG PO TABS
200.0000 mg | ORAL_TABLET | Freq: Three times a day (TID) | ORAL | Status: AC
  Administered 2022-03-13 – 2022-03-14 (×5): 200 mg via ORAL
  Filled 2022-03-13 (×6): qty 1

## 2022-03-13 NOTE — Progress Notes (Signed)
Pt refused BIPAP QHS. Pt stable and tolerating 2L [place] will continue to monitor

## 2022-03-13 NOTE — Progress Notes (Signed)
Physical Therapy Treatment Patient Details Name: [name] MRN: [id number] DOB: 1934-04-11 Today's Date: 03/13/2022   History of Present Illness Patient is a 88 y/o female who presents on 1/17 with SOB. Found to have acute respiratory failure secondary to RSV infection. PMH includes NSTEMI, CAD, HTN, COPD, sarcoid bronchiestasis, A-fib, CHF.    PT Comments    Continuing work on functional mobility and activity tolerance;  Session focused on gently progressing amb, with monitor of O2 sats on room air; Got up to bathroom with min assist and RW, O2 sats remained at or above 89%; big O2 sat drop on the way back from bathroom to the recliner by the window; decr to low 80s concurrent with pt reporting shortness of breath; restarted suppemental O2 2 L once in the recliner, and sats recovered to 97% within 2 mintues    Recommendations for follow up therapy are one component of a multi-disciplinary discharge planning process, led by the attending physician.  Recommendations may be updated based on patient status, additional functional criteria and insurance authorization.  Follow Up Recommendations  Home health PT     Assistance Recommended at Discharge Intermittent Supervision/Assistance  Patient can return home with the following A little help with walking and/or transfers;A little help with bathing/dressing/bathroom;Assist for transportation;Assistance with cooking/housework;Direct supervision/assist for medications management;Direct supervision/assist for financial management   Equipment Recommendations  Supplemental O2   Recommendations for Other Services OT consult     Precautions / Restrictions Precautions Precautions: Fall;Other (comment) Precaution Comments: watch 02     Mobility  Bed Mobility Overal bed mobility: Needs Assistance Bed Mobility: Supine to Sit     Supine to sit: Supervision, HOB elevated     General bed mobility comments: increased time, no assist.  supervision for safety    Transfers Overall transfer level: Needs assistance Equipment used: Rolling walker (2 wheels) Transfers: Sit to/from Stand Sit to Stand: Min assist           General transfer comment: Min assist for boost and balance to rise from low bed setting and from commode. Stable once upright on RW for support.    Ambulation/Gait Ambulation/Gait assistance: Min guard (with physical contact) Gait Distance (Feet): 30 Feet (to and from bathroom) Assistive device: Rolling walker (2 wheels) Gait Pattern/deviations: Step-through pattern, Decreased stride length, Trunk flexed Gait velocity: decreased     General Gait Details: Initiated walk on room air, and O2 sats remained at or above 89% with walk to the bathroom; big O2 sat drop on the way back from bathroom to the recliner by the window; decr to low 80s concurrent with pt reporting shortness of breath; restarted suppemental O2 2 L once in the recliner, and sats recovered to 97% within 2 mintues   Stairs             Wheelchair Mobility    Modified Rankin (Stroke Patients Only)       Balance     Sitting balance-Leahy Scale: Good       Standing balance-Leahy Scale: Poor Standing balance comment: Requires UE support in standing                            Cognition Arousal/Alertness: Awake/alert Behavior During Therapy: WFL for tasks assessed/performed Overall Cognitive Status: No family/caregiver present to determine baseline cognitive functioning  General Comments: WFL for basic mobility tasks, might need further assessment per notes        Exercises      General Comments General comments (skin integrity, edema, etc.): big O2 sat drop on the way back from bathroom to the recliner by the window on room air; decr to low 80s concurrent with pt reporting shortness of breath; restarted suppemental O2 2 L once in the recliner, and sats  recovered to 97% within 2 mintues      Pertinent Vitals/Pain Pain Assessment Pain Assessment: Faces Faces Pain Scale: Hurts a little bit Pain Location: R knee with longstanding OA Pain Descriptors / Indicators: Aching Pain Intervention(s): Monitored during session    Home Living                          Prior Function            PT Goals (current goals can now be found in the care plan section) Acute Rehab PT Goals Patient Stated Goal: to be able to breathe, get off 02 and go home PT Goal Formulation: With patient Time For Goal Achievement: 03/25/22 Potential to Achieve Goals: Good Progress towards PT goals: Progressing toward goals (slowly)    Frequency    Min 3X/week      PT Plan Current plan remains appropriate    Co-evaluation              AM-PAC PT "6 Clicks" Mobility   Outcome Measure  Help needed turning from your back to your side while in a flat bed without using bedrails?: A Little Help needed moving from lying on your back to sitting on the side of a flat bed without using bedrails?: A Little Help needed moving to and from a bed to a chair (including a wheelchair)?: A Little Help needed standing up from a chair using your arms (e.g., wheelchair or bedside chair)?: A Little Help needed to walk in hospital room?: A Little Help needed climbing 3-5 steps with a railing? : A Lot 6 Click Score: 17    End of Session Equipment Utilized During Treatment: Gait belt;Oxygen Activity Tolerance: Patient limited by fatigue Patient left: in chair;with call bell/phone within reach;with chair alarm set Nurse Communication: Mobility status PT Visit Diagnosis: Muscle weakness (generalized) (M62.81);Difficulty in walking, not elsewhere classified (R26.2)     Time: 1526-1600 PT Time Calculation (min) (ACUTE ONLY): 34 min  Charges:  $Gait Training: 8-22 mins $Therapeutic Activity: 8-22 mins                     [name], PT  Acute  Rehabilitation Services Office [phone]    [name] 03/13/2022, 5:02 PM

## 2022-03-13 NOTE — Progress Notes (Signed)
Pt stable and tolerating 2L [place]. Pt refuses to wear BIPAP QHS. No distress noted at this time will continue to monitor

## 2022-03-13 NOTE — Progress Notes (Signed)
Progress Note    [name]   [id number]  DOB: 11/03/1934  DOA: 03/09/2022     4 PCP: [name], MD  Initial CC: SOB  Hospital Course: [name] is an 88 yo female with PMH A-fib, PSVT, pulmonary sarcoid, scoliosis, HLD, HTN, COPD, bronchiectasis, CAD, low back pain, anemia, GERD, diastolic CHF who presented with shortness of breath. On workup in the ER she was found to be in A-fib with RVR and hypoxic initially requiring BiPAP which was able to be weaned down to 6 L. Viral swab testing also was positive for RSV. Cardiology was consulted and she was started on an amiodarone drip due to soft blood pressures.  Interval History:  Still coughing and feeling weak this morning but no events overnight.  Remains on 2 L.  Eating fairly well but does get short of breath with eating she says.  Assessment and Plan:  COPD exacerbation Acute respiratory failure with hypoxia History of bronchiectasis RSV infection > Patient presenting with progressive shortness of breath and fatigue for 1-2 days PTA > Noted to have new oxygen requirement and significant tachycardia > Positive for RSV.  Did have leukocytosis but chest x-ray stable.  At this point leukocytosis is presumed reactive > Atrial fibrillation with RVR may be contributing to hypoxia some as below -Continue breathing treatments, steroids - Wean oxygen as able; suspect she is going to need short term (at least) O2 at discharge given underlying chronic lung disease   Atrial fibrillation with RVR History of PSVT > Patient also noted to be in A-fib with RVR.  Initial rates as high as the 180s to 200s via EMS.  In the ED as high as 160s to 170s.  Received dose of IV metoprolol and then IV diltiazem and started on IV diltiazem drip. > Patient became hypotensive and with consultation of cardiology was switched to one-time dose of p.o. digoxin and then scheduled p.o. metoprolol.   - patient ultimately started on amiodarone drip  for rate control - patient noted to be converted back to NSR this morning - appreciate cardiology assistance; transitioned to short course of oral Amio -Echo ordered on admission: EF 60 to 65%, no RWMA, mild LVH, grade 2 DD -TSH normal, 1.031 - Continue Eliquis   Hyperlipidemia Carotid artery disease Coronary artery disease - Continue home aspirin -Continue metoprolol   Hypertension > Low normal blood pressure here with treatments for A-fib with RVR - Currently receiving metoprolol as above   Chronic diastolic CHF > Last echo was in June of last year with EF 55-60%, G2 DD, normal RV function. -No significant changes noted on repeat echo on admission   GERD - Continue home PPI   History of pulmonary sarcoid - follows with Dr. [name]; last CT June 2023 noted stable -Short course of amiodarone planned per cardiology in context of this as well   Old records reviewed in assessment of this patient  Antimicrobials:   DVT prophylaxis:  apixaban (ELIQUIS) tablet 2.5 mg Start: 03/09/22 2200 apixaban (ELIQUIS) tablet 2.5 mg   Code Status:   Code Status: DNR  Mobility Assessment (last 72 hours)     Mobility Assessment     Row Name 03/12/22 2000 03/12/22 1200 03/12/22 0021 03/11/22 2000 03/11/22 1442   Does patient have an order for bedrest or is patient medically unstable No - Continue assessment -- No - Continue assessment No - Continue assessment --   What is the highest level of mobility based on the progressive  mobility assessment? Level 5 (Walks with assist in room/hall) - Balance while stepping forward/back and can walk in room with assist - Complete Level 5 (Walks with assist in room/hall) - Balance while stepping forward/back and can walk in room with assist - Complete Level 5 (Walks with assist in room/hall) - Balance while stepping forward/back and can walk in room with assist - Complete Level 5 (Walks with assist in room/hall) - Balance while stepping forward/back and can  walk in room with assist - Complete Level 5 (Walks with assist in room/hall) - Balance while stepping forward/back and can walk in room with assist - Complete    Row Name 03/11/22 0726           Does patient have an order for bedrest or is patient medically unstable No - Continue assessment       What is the highest level of mobility based on the progressive mobility assessment? Level 5 (Walks with assist in room/hall) - Balance while stepping forward/back and can walk in room with assist - Complete                Barriers to discharge:  Disposition Plan:  Home Status is: Inpt  Objective: Blood pressure (!) 147/87, pulse (!) 55, temperature 98 F (36.7 C), temperature source Oral, resp. rate 17, last menstrual period 02/22/1983, SpO2 94 %.  Examination:  Physical Exam Constitutional:      General: She is not in acute distress.    Appearance: Normal appearance.  HENT:     Head: Normocephalic and atraumatic.     Mouth/Throat:     Mouth: Mucous membranes are moist.  Eyes:     Extraocular Movements: Extraocular movements intact.  Cardiovascular:     Rate and Rhythm: Normal rate and regular rhythm.  Pulmonary:     Effort: Pulmonary effort is normal. No respiratory distress.     Breath sounds: Rhonchi present. No wheezing.  Abdominal:     General: Bowel sounds are normal. There is no distension.     Palpations: Abdomen is soft.     Tenderness: There is no abdominal tenderness.  Musculoskeletal:        General: No swelling. Normal range of motion.     Cervical back: Normal range of motion and neck supple.  Skin:    General: Skin is warm and dry.  Neurological:     General: No focal deficit present.     Mental Status: She is alert.  Psychiatric:        Mood and Affect: Mood normal.        Behavior: Behavior normal.      Consultants:  Cardiology  Procedures:    Data Reviewed: Results for orders placed or performed during the hospital encounter of 03/09/22 (from  the past 24 hour(s))  Urinalysis, Routine w reflex microscopic Urine, Clean Catch     Status: Abnormal   Collection Time: 03/12/22  1:19 PM  Result Value Ref Range   Color, Urine YELLOW YELLOW   APPearance HAZY (A) CLEAR   Specific Gravity, Urine 1.025 1.005 - 1.030   pH 5.0 5.0 - 8.0   Glucose, UA NEGATIVE NEGATIVE mg/dL   Hgb urine dipstick NEGATIVE NEGATIVE   Bilirubin Urine NEGATIVE NEGATIVE   Ketones, ur NEGATIVE NEGATIVE mg/dL   Protein, ur NEGATIVE NEGATIVE mg/dL   Nitrite NEGATIVE NEGATIVE   Leukocytes,Ua NEGATIVE NEGATIVE   RBC / HPF 0-5 0 - 5 RBC/hpf   WBC, UA 0-5 0 - 5 WBC/hpf  Bacteria, UA NONE SEEN NONE SEEN   Squamous Epithelial / HPF 0-5 0 - 5 /HPF   Amorphous Crystal PRESENT   Basic metabolic panel     Status: Abnormal   Collection Time: 03/13/22  3:09 AM  Result Value Ref Range   Sodium 135 135 - 145 mmol/L   Potassium 4.5 3.5 - 5.1 mmol/L   Chloride 105 98 - 111 mmol/L   CO2 21 (L) 22 - 32 mmol/L   Glucose, Bld 187 (H) 70 - 99 mg/dL   BUN 30 (H) 8 - 23 mg/dL   Creatinine, Ser 0.87 0.44 - 1.00 mg/dL   Calcium 8.3 (L) 8.9 - 10.3 mg/dL   GFR, Estimated >60 >60 mL/min   Anion gap 9 5 - 15  CBC with Differential/Platelet     Status: Abnormal   Collection Time: 03/13/22  3:09 AM  Result Value Ref Range   WBC 13.7 (H) 4.0 - 10.5 K/uL   RBC 4.12 3.87 - 5.11 MIL/uL   Hemoglobin 11.5 (L) 12.0 - 15.0 g/dL   HCT 35.8 (L) 36.0 - 46.0 %   MCV 86.9 80.0 - 100.0 fL   MCH 27.9 26.0 - 34.0 pg   MCHC 32.1 30.0 - 36.0 g/dL   RDW 16.3 (H) 11.5 - 15.5 %   Platelets 233 150 - 400 K/uL   nRBC 0.0 0.0 - 0.2 %   Neutrophils Relative % 90 %   Neutro Abs 12.4 (H) 1.7 - 7.7 K/uL   Lymphocytes Relative 6 %   Lymphs Abs 0.8 0.7 - 4.0 K/uL   Monocytes Relative 2 %   Monocytes Absolute 0.2 0.1 - 1.0 K/uL   Eosinophils Relative 0 %   Eosinophils Absolute 0.0 0.0 - 0.5 K/uL   Basophils Relative 0 %   Basophils Absolute 0.0 0.0 - 0.1 K/uL   Immature Granulocytes 2 %   Abs  Immature Granulocytes 0.25 (H) 0.00 - 0.07 K/uL  Magnesium     Status: None   Collection Time: 03/13/22  3:09 AM  Result Value Ref Range   Magnesium 2.1 1.7 - 2.4 mg/dL    I have reviewed pertinent nursing notes, vitals, labs, and images as necessary. I have ordered labwork to follow up on as indicated.  I have reviewed the last notes from staff over past 24 hours. I have discussed patient's care plan and test results with nursing staff, CM/SW, and other staff as appropriate.  Time spent: Greater than 50% of the 55 minute visit was spent in counseling/coordination of care for the patient as laid out in the A&P.   LOS: 4 days   Dwyane Dee, MD [facility] 03/13/2022, 12:31 PM

## 2022-03-14 ENCOUNTER — Inpatient Hospital Stay (HOSPITAL_COMMUNITY): Payer: Medicare Other

## 2022-03-14 DIAGNOSIS — J9601 Acute respiratory failure with hypoxia: Secondary | ICD-10-CM | POA: Diagnosis not present

## 2022-03-14 DIAGNOSIS — J121 Respiratory syncytial virus pneumonia: Secondary | ICD-10-CM | POA: Diagnosis not present

## 2022-03-14 DIAGNOSIS — I480 Paroxysmal atrial fibrillation: Secondary | ICD-10-CM | POA: Diagnosis not present

## 2022-03-14 DIAGNOSIS — J471 Bronchiectasis with (acute) exacerbation: Secondary | ICD-10-CM | POA: Diagnosis not present

## 2022-03-14 LAB — BASIC METABOLIC PANEL
Anion gap: 6 (ref 5–15)
BUN: 29 mg/dL — ABNORMAL HIGH (ref 8–23)
CO2: 24 mmol/L (ref 22–32)
Calcium: 8.4 mg/dL — ABNORMAL LOW (ref 8.9–10.3)
Chloride: 105 mmol/L (ref 98–111)
Creatinine, Ser: 0.91 mg/dL (ref 0.44–1.00)
GFR, Estimated: >60 mL/min (ref >60)
Glucose, Bld: 147 mg/dL — ABNORMAL HIGH (ref 70–99)
Potassium: 4.3 mmol/L (ref 3.5–5.1)
Sodium: 135 mmol/L (ref 135–145)

## 2022-03-14 LAB — CBC WITH DIFFERENTIAL/PLATELET
Abs Immature Granulocytes: 0.37 10*3/uL — ABNORMAL HIGH (ref 0.00–0.07)
Basophils Absolute: 0 10*3/uL (ref 0.0–0.1)
Basophils Relative: 0 %
Eosinophils Absolute: 0 10*3/uL (ref 0.0–0.5)
Eosinophils Relative: 0 %
HCT: 36.8 % (ref 36.0–46.0)
Hemoglobin: 11.6 g/dL — ABNORMAL LOW (ref 12.0–15.0)
Immature Granulocytes: 2 %
Lymphocytes Relative: 5 %
Lymphs Abs: 1 10*3/uL (ref 0.7–4.0)
MCH: 27.7 pg (ref 26.0–34.0)
MCHC: 31.5 g/dL (ref 30.0–36.0)
MCV: 87.8 fL (ref 80.0–100.0)
Monocytes Absolute: 0.3 10*3/uL (ref 0.1–1.0)
Monocytes Relative: 1 %
Neutro Abs: 16.6 10*3/uL — ABNORMAL HIGH (ref 1.7–7.7)
Neutrophils Relative %: 92 %
Platelets: 270 10*3/uL (ref 150–400)
RBC: 4.19 MIL/uL (ref 3.87–5.11)
RDW: 16.1 % — ABNORMAL HIGH (ref 11.5–15.5)
WBC: 18.2 10*3/uL — ABNORMAL HIGH (ref 4.0–10.5)
nRBC: 0 % (ref 0.0–0.2)

## 2022-03-14 LAB — PROCALCITONIN: Procalcitonin: <0.1 ng/mL

## 2022-03-14 LAB — MAGNESIUM: Magnesium: 2.2 mg/dL (ref 1.7–2.4)

## 2022-03-14 MED ORDER — REVEFENACIN 175 MCG/3ML IN SOLN
175.0000 ug | Freq: Every day | RESPIRATORY_TRACT | Status: DC
  Administered 2022-03-14 – 2022-03-18 (×4): 175 ug via RESPIRATORY_TRACT
  Filled 2022-03-14 (×4): qty 3

## 2022-03-14 MED ORDER — METHYLPREDNISOLONE SODIUM SUCC 40 MG IJ SOLR
40.0000 mg | Freq: Every day | INTRAMUSCULAR | Status: DC
  Administered 2022-03-15 – 2022-03-18 (×4): 40 mg via INTRAVENOUS
  Filled 2022-03-14 (×4): qty 1

## 2022-03-14 MED ORDER — BUDESONIDE 0.25 MG/2ML IN SUSP
0.2500 mg | Freq: Two times a day (BID) | RESPIRATORY_TRACT | Status: DC
  Administered 2022-03-14 – 2022-03-18 (×8): 0.25 mg via RESPIRATORY_TRACT
  Filled 2022-03-14 (×9): qty 2

## 2022-03-14 MED ORDER — ARFORMOTEROL TARTRATE 15 MCG/2ML IN NEBU
15.0000 ug | INHALATION_SOLUTION | Freq: Two times a day (BID) | RESPIRATORY_TRACT | Status: DC
  Administered 2022-03-14 – 2022-03-18 (×8): 15 ug via RESPIRATORY_TRACT
  Filled 2022-03-14 (×9): qty 2

## 2022-03-14 MED ORDER — ORAL CARE MOUTH RINSE: 15.0000 mL | OROMUCOSAL | Status: DC | PRN

## 2022-03-14 MED ORDER — LISINOPRIL 20 MG PO TABS
20.0000 mg | ORAL_TABLET | Freq: Every day | ORAL | Status: DC
  Administered 2022-03-14 – 2022-03-17 (×4): 20 mg via ORAL
  Filled 2022-03-14 (×5): qty 1

## 2022-03-14 MED ORDER — HYDROCODONE BIT-HOMATROP MBR 5-1.5 MG/5ML PO SOLN
5.0000 mL | Freq: Four times a day (QID) | ORAL | Status: DC | PRN
  Administered 2022-03-14 – 2022-03-17 (×6): 5 mL via ORAL
  Filled 2022-03-14 (×6): qty 5

## 2022-03-14 MED ORDER — IPRATROPIUM-ALBUTEROL 0.5-2.5 (3) MG/3ML IN SOLN
3.0000 mL | Freq: Four times a day (QID) | RESPIRATORY_TRACT | Status: DC | PRN
  Filled 2022-03-14: qty 3

## 2022-03-14 NOTE — Progress Notes (Signed)
Progress Note    [name]   ZWC:[id number]  DOB: 07-27-1934  DOA: 03/09/2022     5 PCP: [name], MD  Initial CC: SOB  Hospital Course: [name] is an 88 yo female with PMH A-fib, PSVT, pulmonary sarcoid, scoliosis, HLD, HTN, COPD, bronchiectasis, CAD, low back pain, anemia, GERD, diastolic CHF who presented with shortness of breath. On workup in the ER she was found to be in A-fib with RVR and hypoxic initially requiring BiPAP which was able to be weaned down to 6 L. Viral swab testing also was positive for RSV. Cardiology was consulted and she was started on an amiodarone drip due to soft blood pressures.  Interval History:  Remains stable; actually down to 1L this morning. Does get dyspneic as expected with exertion. Evaluated by pulmonology today as well.  I called and spoke with [name] while in room this morning as well.   Assessment and Plan:  COPD exacerbation Acute respiratory failure with hypoxia History of bronchiectasis RSV infection > Patient presenting with progressive shortness of breath and fatigue for 1-2 days PTA > Noted to have new oxygen requirement and significant tachycardia > Positive for RSV.  Did have leukocytosis but chest x-ray stable.  At this point leukocytosis is presumed reactive > Atrial fibrillation with RVR may be contributing to hypoxia some as below -Continue breathing treatments, steroids (now being weaned slowly) - Wean oxygen as able; suspect she is going to need short term (at least) O2 at discharge given underlying chronic lung disease (probably with exertion) - greatly appreciate pulmonology consult; agree with no abx at this time; leukocytosis still considered reactive and/or demargination from steroid use - nebs/breathing treatments adjusted per pulmonology - steroid taper planned   Atrial fibrillation with RVR History of PSVT > Patient also noted to be in A-fib with RVR.  Initial rates as high as the 180s to 200s  via EMS. In the ED as high as 160s to 170s.  Received dose of IV metoprolol and then IV diltiazem and started on IV diltiazem drip. > Patient became hypotensive and with consultation of cardiology was switched to one-time dose of p.o. digoxin and then scheduled p.o. metoprolol.   - patient ultimately started on amiodarone drip for rate control - patient noted to be converted back to NSR this morning - appreciate cardiology assistance; transitioned to short course of oral Amio -Echo ordered on admission: EF 60 to 65%, no RWMA, mild LVH, grade 2 DD -TSH normal, 1.031 - Continue Eliquis   Hyperlipidemia Carotid artery disease Coronary artery disease - Continue home aspirin -Continue metoprolol   Hypertension > Low normal blood pressure here with treatments for A-fib with RVR - Currently receiving metoprolol as above   Chronic diastolic CHF > Last echo was in June of last year with EF 55-60%, G2 DD, normal RV function. -No significant changes noted on repeat echo on admission   GERD - Continue home PPI   History of pulmonary sarcoid - follows with Dr. [name]; last CT June 2023 noted stable -Short course of amiodarone planned per cardiology in context of this as well   Old records reviewed in assessment of this patient  Antimicrobials:   DVT prophylaxis:  apixaban (ELIQUIS) tablet 2.5 mg Start: 03/09/22 2200 apixaban (ELIQUIS) tablet 2.5 mg   Code Status:   Code Status: DNR  Mobility Assessment (last 72 hours)     Mobility Assessment     Row Name 03/14/22 1516 03/14/22 0918 03/13/22 1600 03/12/22 2000  03/12/22 1200   Does patient have an order for bedrest or is patient medically unstable -- No - Continue assessment -- No - Continue assessment --   What is the highest level of mobility based on the progressive mobility assessment? Level 5 (Walks with assist in room/hall) - Balance while stepping forward/back and can walk in room with assist - Complete Level 5 (Walks with  assist in room/hall) - Balance while stepping forward/back and can walk in room with assist - Complete Level 5 (Walks with assist in room/hall) - Balance while stepping forward/back and can walk in room with assist - Complete Level 5 (Walks with assist in room/hall) - Balance while stepping forward/back and can walk in room with assist - Complete Level 5 (Walks with assist in room/hall) - Balance while stepping forward/back and can walk in room with assist - Complete    Row Name 03/12/22 0021 03/11/22 2000         Does patient have an order for bedrest or is patient medically unstable No - Continue assessment No - Continue assessment      What is the highest level of mobility based on the progressive mobility assessment? Level 5 (Walks with assist in room/hall) - Balance while stepping forward/back and can walk in room with assist - Complete Level 5 (Walks with assist in room/hall) - Balance while stepping forward/back and can walk in room with assist - Complete               Barriers to discharge:  Disposition Plan:  Home Status is: Inpt  Objective: Blood pressure (!) 164/88, pulse 61, temperature 97.9 F (36.6 C), temperature source Oral, resp. rate 19, height '5\' 3"'$  (1.6 m), weight 55.6 kg, last menstrual period 02/22/1983, SpO2 94 %.  Examination:  Physical Exam Constitutional:      General: She is not in acute distress.    Appearance: Normal appearance.  HENT:     Head: Normocephalic and atraumatic.     Mouth/Throat:     Mouth: Mucous membranes are moist.  Eyes:     Extraocular Movements: Extraocular movements intact.  Cardiovascular:     Rate and Rhythm: Normal rate and regular rhythm.  Pulmonary:     Effort: Pulmonary effort is normal. No respiratory distress.     Breath sounds: Rhonchi present. No wheezing.  Abdominal:     General: Bowel sounds are normal. There is no distension.     Palpations: Abdomen is soft.     Tenderness: There is no abdominal tenderness.   Musculoskeletal:        General: No swelling. Normal range of motion.     Cervical back: Normal range of motion and neck supple.  Skin:    General: Skin is warm and dry.  Neurological:     General: No focal deficit present.     Mental Status: She is alert.  Psychiatric:        Mood and Affect: Mood normal.        Behavior: Behavior normal.      Consultants:  Cardiology  Procedures:    Data Reviewed: Results for orders placed or performed during the hospital encounter of 03/09/22 (from the past 24 hour(s))  Basic metabolic panel     Status: Abnormal   Collection Time: 03/14/22  2:11 AM  Result Value Ref Range   Sodium 135 135 - 145 mmol/L   Potassium 4.3 3.5 - 5.1 mmol/L   Chloride 105 98 - 111 mmol/L  CO2 24 22 - 32 mmol/L   Glucose, Bld 147 (H) 70 - 99 mg/dL   BUN 29 (H) 8 - 23 mg/dL   Creatinine, Ser 0.91 0.44 - 1.00 mg/dL   Calcium 8.4 (L) 8.9 - 10.3 mg/dL   GFR, Estimated >60 >60 mL/min   Anion gap 6 5 - 15  CBC with Differential/Platelet     Status: Abnormal   Collection Time: 03/14/22  2:11 AM  Result Value Ref Range   WBC 18.2 (H) 4.0 - 10.5 K/uL   RBC 4.19 3.87 - 5.11 MIL/uL   Hemoglobin 11.6 (L) 12.0 - 15.0 g/dL   HCT 36.8 36.0 - 46.0 %   MCV 87.8 80.0 - 100.0 fL   MCH 27.7 26.0 - 34.0 pg   MCHC 31.5 30.0 - 36.0 g/dL   RDW 16.1 (H) 11.5 - 15.5 %   Platelets 270 150 - 400 K/uL   nRBC 0.0 0.0 - 0.2 %   Neutrophils Relative % 92 %   Neutro Abs 16.6 (H) 1.7 - 7.7 K/uL   Lymphocytes Relative 5 %   Lymphs Abs 1.0 0.7 - 4.0 K/uL   Monocytes Relative 1 %   Monocytes Absolute 0.3 0.1 - 1.0 K/uL   Eosinophils Relative 0 %   Eosinophils Absolute 0.0 0.0 - 0.5 K/uL   Basophils Relative 0 %   Basophils Absolute 0.0 0.0 - 0.1 K/uL   Immature Granulocytes 2 %   Abs Immature Granulocytes 0.37 (H) 0.00 - 0.07 K/uL  Magnesium     Status: None   Collection Time: 03/14/22  2:11 AM  Result Value Ref Range   Magnesium 2.2 1.7 - 2.4 mg/dL  Procalcitonin - Baseline      Status: None   Collection Time: 03/14/22  2:11 AM  Result Value Ref Range   Procalcitonin <0.10 ng/mL    I have reviewed pertinent nursing notes, vitals, labs, and images as necessary. I have ordered labwork to follow up on as indicated.  I have reviewed the last notes from staff over past 24 hours. I have discussed patient's care plan and test results with nursing staff, CM/SW, and other staff as appropriate.  Time spent: Greater than 50% of the 55 minute visit was spent in counseling/coordination of care for the patient as laid out in the A&P.   LOS: 5 days   Dwyane Dee, MD [facility] 03/14/2022, 4:32 PM

## 2022-03-14 NOTE — Plan of Care (Signed)
Pt is alert and oriented times 4. Pt was sitting in the chair watching tv during shift change. Pt transfer from chair to bed with a walker, noted. Pt brushed her teeth. Pt took all medications and tolerated well. Call bell in reach. Pt c/o no pain or discomfort at this time.  Problem: Education: Goal: Knowledge of disease or condition will improve Outcome: Progressing Goal: Knowledge of the prescribed therapeutic regimen will improve Outcome: Progressing Goal: Individualized Educational Video(s) Outcome: Progressing   Problem: Activity: Goal: Ability to tolerate increased activity will improve Outcome: Progressing Goal: Will verbalize the importance of balancing activity with adequate rest periods Outcome: Progressing   Problem: Respiratory: Goal: Ability to maintain a clear airway will improve Outcome: Progressing Goal: Levels of oxygenation will improve Outcome: Progressing Goal: Ability to maintain adequate ventilation will improve Outcome: Progressing

## 2022-03-14 NOTE — Consult Note (Signed)
NAME:  [name], MRN:  [id number], DOB:  21-Jun-1934, LOS: 5 ADMISSION DATE:  03/09/2022, CONSULTATION DATE:  03/14/22 REFERRING MD:  TRH, CHIEF COMPLAINT:  cough   History of Present Illness:  88 year old woman whom we are seeing in consultation for evaluation of hypoxemia.  Patient presented to ED 03/09/2022.  Cough, shortness of breath.  RSV positive.  Initially on BiPAP, then nasal cannula.  In interim since admission, been given high-dose steroids.  Ongoing cough.  She feels her breathing is doing okay.  Weaned down to 1 L nasal cannula at time of initial PCCM evaluation 1/22.  It appears her breathing treatments she has not been able to do, Ellipta device is ordered to make triple inhaled therapy.  Her chest x-ray 03/09/2022 reveals unchanged chronic findings compared to 10/07/2021 on my review interpretation of both films.  Repeat chest x-ray 1/22 unchanged compared to 1/17 on my review and interpretation.  She had PFTs many years ago, 11/2013 reveals moderate fixed obstruction, lung volumes within normal limits.  Course this hospitalization complicated by atrial fibrillation with RVR now better controlled.  BNP was mildly elevated on admission.  Most recent pulmonary note 07/2021 in the office reviewed.  Cardiology consultation note this admission reviewed.   Pertinent  Medical History  Sarcoidosis, COPD, likely restriction from kyphoscoliosis  Significant Hospital Events: Including procedures, antibiotic start and stop dates in addition to other pertinent events   03/09/2022 admitted with hypoxemia, RSV positive 03/14/2022 PCCM consulted with ongoing hypoxemia, desaturation, chest x-ray unchanged from admission  Interim History / Subjective:    Objective   Blood pressure (!) 164/88, pulse 65, temperature 97.9 F (36.6 C), temperature source Oral, resp. rate 19, height '5\' 3"'$  (1.6 m), weight 55.6 kg, last menstrual period 02/22/1983, SpO2 96 %.    FiO2 (%):  [28 %] 28 %    Intake/Output Summary (Last 24 hours) at 03/14/2022 1022 Last data filed at 03/13/2022 2032 Gross per 24 hour  Intake 363 ml  Output --  Net 363 ml   Filed Weights   03/14/22 0515  Weight: 55.6 kg    Examination: General: Sitting in bed, no acute distress Eyes: EOMI, no icterus Neck: Supple, no JVP Lungs: Rhonchorous on expiration, no wheeze appreciated, normal work of breathing on 1 L nasal cannula Cardiovascular: Warm, no edema Abdomen: Nondistended, bowel sounds present MSK: No synovitis, no joint effusion Neuro: No focal deficits, sensation intact Psych: Normal mood, full affect  Resolved Hospital Problem list     Assessment & Plan:  Acute hypoxemic respiratory failure in the setting of RSV infection: Satting 96% on 1 L.  Suspect on room air will be fine.  Reported desaturation with exertion.  Likely in setting of presumed extrathoracic restriction due to severe kyphoscoliosis on plain films, underlying obstructive lung disease demonstrated on prior PFTs.  Chest x-ray on admission and again today showed chronic changes but overall stable.  Do not feel like there is role for additional interventions, unlikely to develop new pneumonia etc. based on radiographic findings.   -- Continue Solu-Medrol for RSV, decrease to daily tomorrow, would likely benefit from decrease over time to prednisone 20 mg for 5 days then 10 mg for 5 days at discharge -- Stop Ellipta devices, start nebulized LAMA, LABA, ICS therapies with as needed DuoNebs -- Patient will benefit from PT/OT, out of bed to chair, ambulate as possible, minimize atelectasis, incentive spirometer, airway clearance, needs to be taking deep breaths to minimize atelectasis likely contributing to  ongoing hypoxemia --Consider trial of diuresis, admittedly looks euvolemic but if worsening in the future may be worth a trial, BNP mildly elevated on admission  Cough: Related to RSV.  Likely will linger for weeks. -- Hycodan cough  syrup ordered while admitted -- Continue Mucinex  Best Practice (right click and "Reselect all SmartList Selections" daily)   Per primary  Labs   CBC: Recent Labs  Lab 03/09/22 1325 03/10/22 0400 03/11/22 0535 03/12/22 0439 03/13/22 0309 03/14/22 0211  WBC 15.5* 11.7* 17.7* 14.2* 13.7* 18.2*  NEUTROABS 11.5*  --  15.4* 11.6* 12.4* 16.6*  HGB 13.6 11.8* 12.5 11.7* 11.5* 11.6*  HCT 42.7 35.8* 37.3 36.8 35.8* 36.8  MCV 87.7 86.5 87.4 87.8 86.9 87.8  PLT 279 229 254 248 233 381    Basic Metabolic Panel: Recent Labs  Lab 03/10/22 0400 03/11/22 0535 03/12/22 0439 03/13/22 0309 03/14/22 0211  NA 134* 137 138 135 135  K 3.5 4.2 3.9 4.5 4.3  CL 102 102 106 105 105  CO2 '23 25 23 '$ 21* 24  GLUCOSE 188* 85 76 187* 147*  BUN 30* 31* 32* 30* 29*  CREATININE 1.13* 0.95 0.89 0.87 0.91  CALCIUM 8.5* 8.5* 8.4* 8.3* 8.4*  MG 2.3 2.1 2.1 2.1 2.2   GFR: Estimated Creatinine Clearance: 37.4 mL/min (by C-G formula based on SCr of 0.91 mg/dL). Recent Labs  Lab 03/11/22 0535 03/12/22 0439 03/13/22 0309 03/14/22 0211  PROCALCITON  --   --   --  <0.10  WBC 17.7* 14.2* 13.7* 18.2*    Liver Function Tests: Recent Labs  Lab 03/09/22 1325 03/10/22 0400  AST 24 15  ALT 15 14  ALKPHOS 78 64  BILITOT 0.5 0.6  PROT 5.9* 5.1*  ALBUMIN 3.3* 2.8*   No results for input(s): "LIPASE", "AMYLASE" in the last 168 hours. No results for input(s): "AMMONIA" in the last 168 hours.  ABG No results found for: "PHART", "PCO2ART", "PO2ART", "HCO3", "TCO2", "ACIDBASEDEF", "O2SAT"   Coagulation Profile: No results for input(s): "INR", "PROTIME" in the last 168 hours.  Cardiac Enzymes: No results for input(s): "CKTOTAL", "CKMB", "CKMBINDEX", "TROPONINI" in the last 168 hours.  HbA1C: Hgb A1c MFr Bld  Date/Time Value Ref Range Status  08/08/2021 01:24 AM 5.7 (H) 4.8 - 5.6 % Final    Comment:    (NOTE) Pre diabetes:          5.7%-6.4%  Diabetes:              >6.4%  Glycemic control for    <7.0% adults with diabetes   09/19/2019 01:40 PM 5.8 (H) 4.8 - 5.6 % Final    Comment:    (NOTE) Pre diabetes:          5.7%-6.4%  Diabetes:              >6.4%  Glycemic control for   <7.0% adults with diabetes     CBG: No results for input(s): "GLUCAP" in the last 168 hours.  Review of Systems:   No orthopnea or PND.  Comprehensive review of systems otherwise negative.  Past Medical History:  She,  has a past medical history of Acute maxillary sinusitis (10/17/2017), Acute posthemorrhagic anemia (08/22/2012), Acute renal failure syndrome ([place]) (01/04/2019), Allergy, Anemia, Arthritis, Asthma, Baker's cyst of knee, right, CAD (coronary artery disease), Carotid stenosis, Closed fracture of right olecranon process (05/09/2017), Closed left subtrochanteric femur fracture S/P Open/closed and reduction, internal medullary fixation  (09/05/2012), Dyspnea, Dysrhythmia, Edema (10/30/2019), Elbow fracture (04/2017), Esophageal reflux,  Fracture of inferior pubic ramus ([place]) (02/28/2019), Hematochezia, Hip fracture ([place]) (12/12/2018), History of echocardiogram, HLD (hyperlipidemia), colonoscopy, Hydronephrosis (07/29/2019), Left foot pain (09/02/2014), Macular pucker, right eye, Osteoporosis, Other chronic pulmonary heart diseases, Paroxysmal supraventricular tachycardia, Pneumonia, Pneumonia (09/18/2019), Pre-op evaluation (06/11/2019), Pyelonephritis (07/29/2019), Sacral insufficiency fracture (12/12/2018), Sarcoidosis, Sepsis ([place]) (07/30/2019), Sepsis due to Escherichia coli (e. coli) ([place]) (07/29/2019), and Sepsis due to urinary tract infection ([place]) (07/18/2019).   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   COLONOSCOPY     CYSTOSCOPY W/ RETROGRADES Left 07/30/2019   Procedure: CYSTOSCOPY WITH RETROGRADE PYELOGRAM LEFT STENT PLACEMENT;  Surgeon: Ardis Hughs, MD;  Location: [facility];  Service: Urology;  Laterality: Left;   CYSTOSCOPY WITH RETROGRADE PYELOGRAM, URETEROSCOPY AND STENT PLACEMENT Left 08/20/2019   Procedure: CYSTOSCOPY, URETEROSCOPY AND STENT PLACEMENT;  Surgeon: Robley Fries, MD;  Location: [facility];  Service: Urology;  Laterality: Left;  1 HR   FOOT SURGERY Right    HIP SURGERY Right 2011   full replacement   HOLMIUM LASER APPLICATION Left 88/50/2774   Procedure: HOLMIUM LASER APPLICATION;  Surgeon: Robley Fries, MD;  Location: [facility];  Service: Urology;  Laterality: Left;   LEFT HEART CATH AND CORONARY ANGIOGRAPHY N/A 08/09/2021   Procedure: LEFT HEART CATH AND CORONARY ANGIOGRAPHY;  Surgeon: Sherren Mocha, MD;  Location: [facility];  Service: Cardiovascular;  Laterality: N/A;   LEG SURGERY Left 06/2012   femur fracture s/p open and closed reduction in [place], Dr. Jimmye Norman   lens replacement Right    right eye   ORIF ELBOW FRACTURE Right 05/09/2017   Procedure: ORIF right olecranon fracture with repair/reconstruction, ulnar nerve transposition as needed;  Surgeon: Roseanne Kaufman, MD;  Location: [facility];  Service: Orthopedics;  Laterality: Right;  Requests 90 mins   pelvis fracture     POLYPECTOMY     REVERSE SHOULDER ARTHROPLASTY Left 06/13/2019   Procedure: REVERSE SHOULDER ARTHROPLASTY;  Surgeon: Justice Britain, MD;  Location: [facility];  Service: Orthopedics;  Laterality: Left;  181mn   SHOULDER ARTHROSCOPY W/ ROTATOR CUFF REPAIR Right    TRAPEZIUM RESECTION Right      Social History:   reports that she has never smoked. She has never used smokeless tobacco. She reports that she does not drink alcohol and does not use drugs.   Family History:  Her family history includes  Arrhythmia in her father; Cancer in her father, mother, and sister; Colon cancer (age of onset: 699 in her mother; Heart attack in her brother; Heart disease in her father; Lung cancer in her father. There is no history of Esophageal cancer, Rectal cancer, Stomach cancer, or Stroke.   Allergies Allergies  Allergen Reactions   Levaquin [Levofloxacin] Other (See Comments)    Joint pain.. Per doctor not to take again   Oxycodone Nausea Only    Can take oxycodone with Zofran Other reaction(s): vomiting   Sulfonamide Derivatives Other (See Comments)    Possibly caused hepatitis   Sulfa Antibiotics Other (See Comments)    Possibly caused hepatitis   Morphine Nausea Only     Home Medications  Prior to Admission medications   Medication Sig Start Date End Date Taking? Authorizing Provider  acetaminophen (TYLENOL) 500 MG tablet Take 1,000 mg by mouth every 6 (six) hours as needed for mild pain.    Yes [provider]  aspirin 81 MG chewable tablet Chew 81 mg by mouth daily.   Yes [provider]  Budeson-Glycopyrrol-Formoterol (BREZTRI AEROSPHERE) 160-9-4.8 MCG/ACT AERO Inhale 2 puffs into the lungs in the morning and at bedtime. 09/29/21  Yes Collene Gobble, MD  Calcium Carbonate Antacid (TUMS ULTRA 1000 PO) Take 2,000-3,000 mg by mouth at bedtime as needed (acid reflux/indigestion/heartburn).   Yes [provider]  denosumab (PROLIA) 60 MG/ML SOLN injection Inject 60 mg into the skin every 6 (six) months. Administer in upper arm, thigh, or abdomen   Yes [provider]  diltiazem (CARDIZEM CD) 120 MG 24 hr capsule Take 1 capsule (120 mg total) by mouth daily. 08/23/21  Yes Weaver, [name] T, PA-C  ELIQUIS 2.5 MG TABS tablet TAKE 1 TABLET(2.5 MG) BY MOUTH TWICE DAILY Patient taking differently: Take 2.5 mg by mouth 2 (two) times daily. 01/31/22  Yes Sherren Mocha, MD  ipratropium-albuterol (DUONEB) 0.5-2.5 (3) MG/3ML SOLN Take 3 mLs by nebulization every 6 (six)  hours as needed (wheezing; shortness of breath). 10/22/21  Yes Cobb, Karie Schwalbe, NP  MACROBID 100 MG capsule Take 100 mg by mouth 2 (two) times daily. for 7 days 03/07/22  Yes [provider]  metoprolol tartrate (LOPRESSOR) 25 MG tablet Take 1 tablet (25 mg total) by mouth daily as needed (palpitations). 08/23/21  Yes Weaver, [name] T, PA-C  nitroGLYCERIN (NITROSTAT) 0.4 MG SL tablet Place 1 tablet (0.4 mg total) under the tongue every 5 (five) minutes as needed for chest pain. 11/06/19  Yes Weaver, [name] T, PA-C  omeprazole (PRILOSEC) 40 MG capsule Take 40 mg by mouth 2 (two) times daily.  11/03/13  Yes [provider]  oxybutynin (DITROPAN) 5 MG tablet Take 5 mg by mouth at bedtime. 08/25/20  Yes [provider]  predniSONE (DELTASONE) 10 MG tablet 4 tabs for 2 days, then 3 tabs for 2 days, 2 tabs for 2 days, then 1 tab for 2 days, then stop 03/04/22  Yes Cobb, Karie Schwalbe, NP  sertraline (ZOLOFT) 50 MG tablet Take 1 tablet (50 mg total) by mouth at bedtime. 12/23/21  Yes Melvenia Beam, MD  traMADol (ULTRAM) 50 MG tablet Take 50 mg by mouth every 8 (eight) hours as needed (pain). 01/12/21  Yes [provider]  VENTOLIN HFA 108 (90 Base) MCG/ACT inhaler INHALE 2 PUFFS INTO THE LUNGS EVERY 4 HOURS AS NEEDED FOR WHEEZING OR SHORTNESS OF BREATH 02/15/22  Yes Byrum, Rose Fillers, MD  Vibegron (GEMTESA) 75 MG TABS Take 75 mg by mouth at bedtime.   Yes [provider]  rosuvastatin (CRESTOR) 10 MG tablet Take 10 mg by mouth daily. Patient not taking: Reported on 03/09/2022 02/12/22   [provider]     Critical care time: n/a   I spent 65 minutes in the care of the patient including review of records, coordination of care, face-to-face visit.  Lanier Clam, MD See Shea Evans for contact info

## 2022-03-14 NOTE — Progress Notes (Signed)
Pt refusing bipap QHS will continue to monitor

## 2022-03-14 NOTE — Progress Notes (Signed)
Patient resting comfortably this AM with no respiratory distress noted.  Bipap ordered for PRN.  Currently not indicated at this time.  Will continue to monitor and assess for bipap needs.

## 2022-03-14 NOTE — Evaluation (Signed)
Occupational Therapy Evaluation Patient Details Name: [name] MRN: [id number] DOB: May 22, 1934 Today's Date: 03/14/2022   History of Present Illness Patient is a 88 y/o female who presents on 1/17 with SOB. Found to have acute respiratory failure secondary to RSV infection. PMH includes NSTEMI, CAD, HTN, COPD, sarcoid bronchiestasis, A-fib, CHF.   Clinical Impression   Patient admitted for the diagnosis above.  PTA she lives with family, who are around to provide any assist needed.  Currently she is needing Min A to supervision for ADL completion and in room mobility.  Poor activity tolerance and generalized weakness are the deficits.  OT will follow in the acute setting to address deficits listed, and assist with the transition home.  [facility] OT can be considered depending on progress.        Recommendations for follow up therapy are one component of a multi-disciplinary discharge planning process, led by the attending physician.  Recommendations may be updated based on patient status, additional functional criteria and insurance authorization.   Follow Up Recommendations  No OT follow up     Assistance Recommended at Discharge Intermittent Supervision/Assistance  Patient can return home with the following Assist for transportation;Assistance with cooking/housework;A little help with walking and/or transfers;A little help with bathing/dressing/bathroom    Functional Status Assessment  Patient has had a recent decline in their functional status and demonstrates the ability to make significant improvements in function in a reasonable and predictable amount of time.  Equipment Recommendations  None recommended by OT    Recommendations for Other Services       Precautions / Restrictions Precautions Precautions: Fall;Other (comment) Precaution Comments: watch 02 Restrictions Weight Bearing Restrictions: No      Mobility Bed Mobility Overal bed mobility: Needs Assistance Bed  Mobility: Supine to Sit     Supine to sit: Supervision, HOB elevated       Patient Response: Cooperative  Transfers Overall transfer level: Needs assistance Equipment used: Rolling walker (2 wheels) Transfers: Sit to/from Stand, Bed to chair/wheelchair/BSC Sit to Stand: Min guard     Step pivot transfers: Supervision, Min guard            Balance Overall balance assessment: Needs assistance Sitting-balance support: Feet supported, No upper extremity supported Sitting balance-Leahy Scale: Good     Standing balance support: Reliant on assistive device for balance Standing balance-Leahy Scale: Poor                             ADL either performed or assessed with clinical judgement   ADL       Grooming: Supervision/safety;Standing               Lower Body Dressing: Min guard;Sit to/from stand   Toilet Transfer: Min guard;Rolling walker (2 wheels);Regular Toilet;Ambulation                   Vision Patient Visual Report: No change from baseline       Perception     Praxis      Pertinent Vitals/Pain Pain Assessment Pain Assessment: No/denies pain Pain Intervention(s): Monitored during session     Hand Dominance Right   Extremity/Trunk Assessment Upper Extremity Assessment Upper Extremity Assessment: Generalized weakness   Lower Extremity Assessment Lower Extremity Assessment: Defer to PT evaluation   Cervical / Trunk Assessment Cervical / Trunk Assessment: Kyphotic   Communication Communication Communication: No difficulties   Cognition Arousal/Alertness: Awake/alert Behavior During Therapy: [facility]  for tasks assessed/performed Overall Cognitive Status: Within Functional Limits for tasks assessed Area of Impairment: Memory                     Memory: Decreased short-term memory               General Comments   See O2 sat note    Exercises     Shoulder Instructions      Home Living Family/patient  expects to be discharged to:: Private residence Living Arrangements: Children Available Help at Discharge: Family;Available 24 hours/day Type of Home: House Home Access: Stairs to enter [employer] of Steps: 1   Home Layout: Two level;Able to live on main level with bedroom/bathroom     Bathroom Shower/Tub: Occupational psychologist: Standard Bathroom Accessibility: Yes   Home Equipment: Conservation officer, nature (2 wheels);BSC/3in1;Shower seat   Additional Comments: daughter works from home      Prior Functioning/Environment Prior Level of Function : Independent/Modified Independent             Mobility Comments: Uses RW as needed esp more recently, No falls in last 6 months ADLs Comments: independent, does a little cooking. Daughter manages medications        OT Problem List: Impaired balance (sitting and/or standing);Decreased activity tolerance;Decreased strength      OT Treatment/Interventions: Self-care/ADL training;Therapeutic activities;Patient/family education;Balance training;Energy conservation    OT Goals(Current goals can be found in the care plan section) Acute Rehab OT Goals Patient Stated Goal: Go back home OT Goal Formulation: With patient Time For Goal Achievement: 03/28/22 Potential to Achieve Goals: Good ADL Goals Pt Will Perform Grooming: with modified independence;standing Pt Will Perform Lower Body Dressing: with modified independence;sit to/from stand Pt Will Transfer to Toilet: with modified independence;ambulating;regular height toilet  OT Frequency: Min 2X/week    Co-evaluation              AM-PAC OT "6 Clicks" Daily Activity     Outcome Measure Help from another person eating meals?: None Help from another person taking care of personal grooming?: A Little Help from another person toileting, which includes using toliet, bedpan, or urinal?: A Little Help from another person bathing (including washing, rinsing, drying)?: A  Little Help from another person to put on and taking off regular upper body clothing?: None Help from another person to put on and taking off regular lower body clothing?: A Little 6 Click Score: 20   End of Session Equipment Utilized During Treatment: Gait belt;Rolling walker (2 wheels);Oxygen Nurse Communication: Mobility status  Activity Tolerance: Patient tolerated treatment well Patient left: in chair;with call bell/phone within reach  OT Visit Diagnosis: Unsteadiness on feet (R26.81);Muscle weakness (generalized) (M62.81)                Time: 8315-1761 OT Time Calculation (min): 16 min Charges:  OT General Charges $OT Visit: 1 Visit OT Evaluation $OT Eval Moderate Complexity: 1 Mod  03/14/2022  RP, OTR/L  Acute Rehabilitation Services  Office:  [phone]   Metta Clines 03/14/2022, 3:18 PM

## 2022-03-14 NOTE — Progress Notes (Signed)
SATURATION QUALIFICATIONS: (This note is used to comply with regulatory documentation for home oxygen)  Patient Saturations on Room Air at Rest = 93%  Patient Saturations on Room Air while Ambulating = 87%  Patient Saturations on 2 Liters of oxygen while Ambulating = 91%  Please briefly explain why patient needs home oxygen:Patient unable to maintain O2 saturations on room air with mobility.   03/14/2022  RP, OTR/L  Acute Rehabilitation Services  Office:  [phone]

## 2022-03-15 DIAGNOSIS — J121 Respiratory syncytial virus pneumonia: Secondary | ICD-10-CM | POA: Diagnosis not present

## 2022-03-15 DIAGNOSIS — J9601 Acute respiratory failure with hypoxia: Secondary | ICD-10-CM | POA: Diagnosis not present

## 2022-03-15 DIAGNOSIS — I480 Paroxysmal atrial fibrillation: Secondary | ICD-10-CM | POA: Diagnosis not present

## 2022-03-15 LAB — CBC WITH DIFFERENTIAL/PLATELET
Abs Immature Granulocytes: 0.58 10*3/uL — ABNORMAL HIGH (ref 0.00–0.07)
Basophils Absolute: 0 10*3/uL (ref 0.0–0.1)
Basophils Relative: 0 %
Eosinophils Absolute: 0 10*3/uL (ref 0.0–0.5)
Eosinophils Relative: 0 %
HCT: 36.3 % (ref 36.0–46.0)
Hemoglobin: 11.9 g/dL — ABNORMAL LOW (ref 12.0–15.0)
Immature Granulocytes: 3 %
Lymphocytes Relative: 8 %
Lymphs Abs: 1.6 10*3/uL (ref 0.7–4.0)
MCH: 28.9 pg (ref 26.0–34.0)
MCHC: 32.8 g/dL (ref 30.0–36.0)
MCV: 88.1 fL (ref 80.0–100.0)
Monocytes Absolute: 1.2 10*3/uL — ABNORMAL HIGH (ref 0.1–1.0)
Monocytes Relative: 6 %
Neutro Abs: 16.2 10*3/uL — ABNORMAL HIGH (ref 1.7–7.7)
Neutrophils Relative %: 83 %
Platelets: 274 10*3/uL (ref 150–400)
RBC: 4.12 MIL/uL (ref 3.87–5.11)
RDW: 16.2 % — ABNORMAL HIGH (ref 11.5–15.5)
WBC: 19.6 10*3/uL — ABNORMAL HIGH (ref 4.0–10.5)
nRBC: 0 % (ref 0.0–0.2)

## 2022-03-15 LAB — BASIC METABOLIC PANEL
Anion gap: 8 (ref 5–15)
BUN: 24 mg/dL — ABNORMAL HIGH (ref 8–23)
CO2: 23 mmol/L (ref 22–32)
Calcium: 8.3 mg/dL — ABNORMAL LOW (ref 8.9–10.3)
Chloride: 103 mmol/L (ref 98–111)
Creatinine, Ser: 0.79 mg/dL (ref 0.44–1.00)
GFR, Estimated: >60 mL/min (ref >60)
Glucose, Bld: 74 mg/dL (ref 70–99)
Potassium: 4.6 mmol/L (ref 3.5–5.1)
Sodium: 134 mmol/L — ABNORMAL LOW (ref 135–145)

## 2022-03-15 LAB — MAGNESIUM: Magnesium: 2.2 mg/dL (ref 1.7–2.4)

## 2022-03-15 LAB — PROCALCITONIN: Procalcitonin: <0.1 ng/mL

## 2022-03-15 NOTE — Progress Notes (Signed)
Physical Therapy Treatment Patient Details Name: [name] MRN: [id number] DOB: [birth year] Today's Date: 03/15/2022   History of Present Illness Patient is a 88 y/o female who presents on 1/17 with SOB. Found to have acute respiratory failure secondary to RSV infection. PMH includes NSTEMI, CAD, HTN, COPD, sarcoid bronchiestasis, A-fib, CHF.    PT Comments    Pt agreeable to today's session, focusing on progression of ambulation and endurance. Pt reports fatigue initially but motivated to return home. Pt ambulating to chair and again in the room with RW and CGA, with seated rest breaks provided after each trial. Pt cued for pursed lip breathing to recover from desat to mid 80s on 2L, recovering to mid 90s. Encouraged pt to sit in chair today, noted cough but unable to cough anything up. Pt declined anymore ambulation trials at this time. Acute PT will continue to follow as appropriate to progress mobility, discharge recommendations remain appropriate.     Recommendations for follow up therapy are one component of a multi-disciplinary discharge planning process, led by the attending physician.  Recommendations may be updated based on patient status, additional functional criteria and insurance authorization.  Follow Up Recommendations  Home health PT     Assistance Recommended at Discharge Intermittent Supervision/Assistance  Patient can return home with the following A little help with walking and/or transfers;A little help with bathing/dressing/bathroom;Assist for transportation;Assistance with cooking/housework;Direct supervision/assist for medications management;Direct supervision/assist for financial management   Equipment Recommendations  None recommended by PT    Recommendations for Other Services       Precautions / Restrictions Precautions Precautions: Fall;Other (comment) Precaution Comments: watch 02 Restrictions Weight Bearing Restrictions: No     Mobility  Bed  Mobility Overal bed mobility: Needs Assistance Bed Mobility: Supine to Sit     Supine to sit: Supervision, HOB elevated     General bed mobility comments: increased time, supervision for safety    Transfers Overall transfer level: Needs assistance Equipment used: Rolling walker (2 wheels) Transfers: Sit to/from Stand, Bed to chair/wheelchair/BSC Sit to Stand: Min guard   Step pivot transfers: Min guard       General transfer comment: min guard provided for balance and safety, pt with one episode of knee buckling as she was returning to sitting but pt able to self-correct    Ambulation/Gait Ambulation/Gait assistance: Min guard Gait Distance (Feet): 5 Feet (20 feet second trial) Assistive device: Rolling walker (2 wheels) Gait Pattern/deviations: Step-through pattern, Decreased stride length, Trunk flexed Gait velocity: decreased     General Gait Details: decreased gait speed with downward gaze, mild path deviation, fatiguing with mobility but recovers with seated rest break   Stairs             Wheelchair Mobility    Modified Rankin (Stroke Patients Only)       Balance Overall balance assessment: Needs assistance Sitting-balance support: Feet supported, No upper extremity supported Sitting balance-Leahy Scale: Fair Sitting balance - Comments: no sway noted   Standing balance support: Reliant on assistive device for balance, During functional activity Standing balance-Leahy Scale: Fair Standing balance comment: use of RW for balance during ambulation                            Cognition Arousal/Alertness: Awake/alert Behavior During Therapy: WFL for tasks assessed/performed Overall Cognitive Status: Within Functional Limits for tasks assessed Area of Impairment: Memory  Memory: Decreased short-term memory         General Comments: WFL for basic mobility tasks        Exercises      General Comments  General comments (skin integrity, edema, etc.): SPO2 dropped to 87% on 1L O2 [place] with 5 feet of ambulation, recovering with a seated rest break. Pt placed on 2L prior to second ambulation trial, desatting to ~85% with 20 feet of ambulation, recovering back to mid 90s with seated rest break, cueing for pursed lip breathing.      Pertinent Vitals/Pain Pain Assessment Pain Assessment: No/denies pain    Home Living                          Prior Function            PT Goals (current goals can now be found in the care plan section) Acute Rehab PT Goals Patient Stated Goal: to go home PT Goal Formulation: With patient Time For Goal Achievement: 03/25/22 Potential to Achieve Goals: Good Progress towards PT goals: Progressing toward goals    Frequency    Min 3X/week      PT Plan Current plan remains appropriate    Co-evaluation              AM-PAC PT "6 Clicks" Mobility   Outcome Measure  Help needed turning from your back to your side while in a flat bed without using bedrails?: A Little Help needed moving from lying on your back to sitting on the side of a flat bed without using bedrails?: A Little Help needed moving to and from a bed to a chair (including a wheelchair)?: A Little Help needed standing up from a chair using your arms (e.g., wheelchair or bedside chair)?: A Little Help needed to walk in hospital room?: A Little Help needed climbing 3-5 steps with a railing? : A Lot 6 Click Score: 17    End of Session Equipment Utilized During Treatment: Gait belt;Oxygen Activity Tolerance: Patient limited by fatigue Patient left: in chair;with call bell/phone within reach;with chair alarm set Nurse Communication: Mobility status PT Visit Diagnosis: Muscle weakness (generalized) (M62.81);Difficulty in walking, not elsewhere classified (R26.2)     Time: 4401-0272 PT Time Calculation (min) (ACUTE ONLY): 20 min  Charges:  $Gait Training: 8-22 mins                      Charlynne Cousins, PT DPT Acute Rehabilitation Services Office [phone]    Luvenia Heller 03/15/2022, 1:16 PM

## 2022-03-15 NOTE — Progress Notes (Signed)
Pt refuses BIPAP QHS stable and tolerating 1L [place]. Will continue to monitor

## 2022-03-15 NOTE — Care Management Important Message (Signed)
Important Message  Patient Details  Name: [name] MRN: [id number] Date of Birth: 1934/03/20   Medicare Important Message Given:  Yes     Lateka Rady 03/15/2022, 2:15 PM

## 2022-03-15 NOTE — Progress Notes (Signed)
1/23 Patient is under Contact Precaution, patient was called via telephone and she acknowledged the IMM Letter. Letter will be mailed to patient's address on file.

## 2022-03-15 NOTE — Plan of Care (Signed)
Pt is alert and oriented times 4. Family is at the bedside. No c/o pain or discomfort. Pt eating dinner, noted. Call bell in reach. Problem: Education: Goal: Knowledge of disease or condition will improve Outcome: Progressing Goal: Knowledge of the prescribed therapeutic regimen will improve Outcome: Progressing Goal: Individualized Educational Video(s) Outcome: Progressing   Problem: Activity: Goal: Ability to tolerate increased activity will improve Outcome: Progressing Goal: Will verbalize the importance of balancing activity with adequate rest periods Outcome: Progressing

## 2022-03-15 NOTE — Progress Notes (Signed)
Progress Note    [name]   VVO:[id number]  DOB: [birth year]  DOA: 03/09/2022     6 PCP: [name], MD  Initial CC: SOB  Hospital Course: [name] is an 88 yo female with PMH A-fib, PSVT, pulmonary sarcoid, scoliosis, HLD, HTN, COPD, bronchiectasis, CAD, low back pain, anemia, GERD, diastolic CHF who presented with shortness of breath. On workup in the ER she was found to be in A-fib with RVR and hypoxic initially requiring BiPAP which was able to be weaned down to 6 L. Viral swab testing also was positive for RSV. Cardiology was consulted and she was started on an amiodarone drip due to soft blood pressures.  Interval History:  No events overnight.  Still with ongoing cough which we have told her will linger for quite some time.  Secretions loosening a little more since yesterday but still "thick" per patient.  Remains on 1 L and still had some desaturation as expected while ambulating yesterday on room air.  She will still need oxygen at discharge going home.  Assessment and Plan:  COPD exacerbation Acute respiratory failure with hypoxia History of bronchiectasis RSV infection > Patient presenting with progressive shortness of breath and fatigue for 1-2 days PTA > Noted to have new oxygen requirement and significant tachycardia > Positive for RSV.  Did have leukocytosis but chest x-ray stable.  At this point leukocytosis is presumed reactive > Atrial fibrillation with RVR may be contributing to hypoxia some as below -Continue breathing treatments, steroids (now being weaned slowly) - Wean oxygen as able; suspect she is going to need short term (at least) O2 at discharge given underlying chronic lung disease (probably with exertion) - greatly appreciate pulmonology consult; agree with no abx at this time; leukocytosis still considered reactive and/or demargination from steroid use - nebs/breathing treatments adjusted per pulmonology - steroid taper planned    Atrial fibrillation with RVR History of PSVT > Patient also noted to be in A-fib with RVR.  Initial rates as high as the 180s to 200s via EMS. In the ED as high as 160s to 170s.  Received dose of IV metoprolol and then IV diltiazem and started on IV diltiazem drip. > Patient became hypotensive and with consultation of cardiology was switched to one-time dose of p.o. digoxin and then scheduled p.o. metoprolol.   - patient ultimately started on amiodarone drip for rate control - patient noted to be converted back to NSR this morning - appreciate cardiology assistance; transitioned to short course of oral Amio -Echo ordered on admission: EF 60 to 65%, no RWMA, mild LVH, grade 2 DD -TSH normal, 1.031 - Continue Eliquis   Hyperlipidemia Carotid artery disease Coronary artery disease - Continue home aspirin -Continue metoprolol   Hypertension > Low normal blood pressure here with treatments for A-fib with RVR - Currently receiving metoprolol as above   Chronic diastolic CHF > Last echo was in June of last year with EF 55-60%, G2 DD, normal RV function. -No significant changes noted on repeat echo on admission   GERD - Continue home PPI   History of pulmonary sarcoid - follows with Dr. [name]; last CT June 2023 noted stable -Short course of amiodarone planned per cardiology in context of this as well   Old records reviewed in assessment of this patient  Antimicrobials:   DVT prophylaxis:  apixaban (ELIQUIS) tablet 2.5 mg Start: 03/09/22 2200 apixaban (ELIQUIS) tablet 2.5 mg   Code Status:   Code Status: DNR  Mobility  Assessment (last 72 hours)     Mobility Assessment     Row Name 03/15/22 [phone] 03/14/22 1516 03/14/22 0918 03/13/22 1600 03/12/22 2000   Does patient have an order for bedrest or is patient medically unstable No - Continue assessment -- No - Continue assessment -- No - Continue assessment   What is the highest level of mobility based on the progressive mobility  assessment? Level 5 (Walks with assist in room/hall) - Balance while stepping forward/back and can walk in room with assist - Complete Level 5 (Walks with assist in room/hall) - Balance while stepping forward/back and can walk in room with assist - Complete Level 5 (Walks with assist in room/hall) - Balance while stepping forward/back and can walk in room with assist - Complete Level 5 (Walks with assist in room/hall) - Balance while stepping forward/back and can walk in room with assist - Complete Level 5 (Walks with assist in room/hall) - Balance while stepping forward/back and can walk in room with assist - Complete            Barriers to discharge:  Disposition Plan:  Home Status is: Inpt  Objective: Blood pressure (!) 164/88, pulse (!) 55, temperature 97.9 F (36.6 C), temperature source Oral, resp. rate 17, height '5\' 3"'$  (1.6 m), weight 55.6 kg, last menstrual period 02/22/1983, SpO2 94 %.  Examination:  Physical Exam Constitutional:      General: She is not in acute distress.    Appearance: Normal appearance.  HENT:     Head: Normocephalic and atraumatic.     Mouth/Throat:     Mouth: Mucous membranes are moist.  Eyes:     Extraocular Movements: Extraocular movements intact.  Cardiovascular:     Rate and Rhythm: Normal rate and regular rhythm.  Pulmonary:     Effort: Pulmonary effort is normal. No respiratory distress.     Breath sounds: Rhonchi present. No wheezing.  Abdominal:     General: Bowel sounds are normal. There is no distension.     Palpations: Abdomen is soft.     Tenderness: There is no abdominal tenderness.  Musculoskeletal:        General: No swelling. Normal range of motion.     Cervical back: Normal range of motion and neck supple.  Skin:    General: Skin is warm and dry.  Neurological:     General: No focal deficit present.     Mental Status: She is alert.  Psychiatric:        Mood and Affect: Mood normal.        Behavior: Behavior normal.       Consultants:  Cardiology Pulmonology  Procedures:    Data Reviewed: Results for orders placed or performed during the hospital encounter of 03/09/22 (from the past 24 hour(s))  Basic metabolic panel     Status: Abnormal   Collection Time: 03/15/22  7:05 AM  Result Value Ref Range   Sodium 134 (L) 135 - 145 mmol/L   Potassium 4.6 3.5 - 5.1 mmol/L   Chloride 103 98 - 111 mmol/L   CO2 23 22 - 32 mmol/L   Glucose, Bld 74 70 - 99 mg/dL   BUN 24 (H) 8 - 23 mg/dL   Creatinine, Ser 0.79 0.44 - 1.00 mg/dL   Calcium 8.3 (L) 8.9 - 10.3 mg/dL   GFR, Estimated >60 >60 mL/min   Anion gap 8 5 - 15  CBC with Differential/Platelet     Status: Abnormal   Collection  Time: 03/15/22  7:05 AM  Result Value Ref Range   WBC 19.6 (H) 4.0 - 10.5 K/uL   RBC 4.12 3.87 - 5.11 MIL/uL   Hemoglobin 11.9 (L) 12.0 - 15.0 g/dL   HCT 36.3 36.0 - 46.0 %   MCV 88.1 80.0 - 100.0 fL   MCH 28.9 26.0 - 34.0 pg   MCHC 32.8 30.0 - 36.0 g/dL   RDW 16.2 (H) 11.5 - 15.5 %   Platelets 274 150 - 400 K/uL   nRBC 0.0 0.0 - 0.2 %   Neutrophils Relative % 83 %   Neutro Abs 16.2 (H) 1.7 - 7.7 K/uL   Lymphocytes Relative 8 %   Lymphs Abs 1.6 0.7 - 4.0 K/uL   Monocytes Relative 6 %   Monocytes Absolute 1.2 (H) 0.1 - 1.0 K/uL   Eosinophils Relative 0 %   Eosinophils Absolute 0.0 0.0 - 0.5 K/uL   Basophils Relative 0 %   Basophils Absolute 0.0 0.0 - 0.1 K/uL   Immature Granulocytes 3 %   Abs Immature Granulocytes 0.58 (H) 0.00 - 0.07 K/uL  Magnesium     Status: None   Collection Time: 03/15/22  7:05 AM  Result Value Ref Range   Magnesium 2.2 1.7 - 2.4 mg/dL  Procalcitonin     Status: None   Collection Time: 03/15/22  7:05 AM  Result Value Ref Range   Procalcitonin <0.10 ng/mL    I have reviewed pertinent nursing notes, vitals, labs, and images as necessary. I have ordered labwork to follow up on as indicated.  I have reviewed the last notes from staff over past 24 hours. I have discussed patient's care plan and  test results with nursing staff, CM/SW, and other staff as appropriate.  Time spent: Greater than 50% of the 55 minute visit was spent in counseling/coordination of care for the patient as laid out in the A&P.   LOS: 6 days   Dwyane Dee, MD [facility] 03/15/2022, 12:30 PM

## 2022-03-15 NOTE — TOC Initial Note (Signed)
Transition of Care [facility]) - Initial/Assessment Note    Patient Details  Name: [name] MRN: [id number] Date of Birth: 10-Dec-1934  [facility] CM/SW Contact:    Levonne Lapping, RN Phone Number: 03/15/2022, 11:40 AM  Clinical Narrative:     CM met patient bedside to discuss discharge plan. Patient lives with her Daughter and Son in [place] in a single family home . She only has two steps to enter the home and resides on the first level. Patient states she was independent with all ADL's prior to admission. She does use a walker in the home. With permission, CM contacted Patient's Daughter to discuss DC plan.  Patient is being recommended Home Health PT and would like to use [facility] as in the past. CM has made the referral. Patient is also requiring O2 now. CM has contacted [employer] (no Patient preference) . No other recommendations at this time [facility] will continue to follow patient for any additional discharge needs         Expected Discharge Plan: [place] Barriers to Discharge: Continued Medical Work up   Patient Goals and CMS Choice Patient states their goals for this hospitalization and ongoing recovery are:: Get home to my family CMS Medicare.gov Compare Post Acute Care list provided to:: Patient Choice offered to / list presented to : Patient      Expected Discharge Plan and Services   Discharge Planning Services: CM Consult Post Acute Care Choice: [place] arrangements for the past 2 months: Single Family Home                 DME Arranged: Oxygen DME Agency: [employer] Date DME Agency Contacted: 03/15/22 Time DME Agency Contacted: [id number] Representative spoke with at DME Agency: Brenton Grills HH Arranged: PT [place]: [place] Date [place]: 03/15/22 Time [place]: 1137 Representative spoke with at [place]: Gerald Stabs  Prior Living Arrangements/Services Living arrangements for the past 2  months: [place] Lives with:: Adult Children (Daughter, Baxter Flattery and Tara's Husband) Patient language and need for interpreter reviewed:: Yes Do you feel safe going back to the place where you live?: Yes      Need for Family Participation in Patient Care: Yes (Comment) Care giver support system in place?: Yes (comment) (Daughter and Son in [place]) Current home services:  (NONE)    Activities of Daily Living   ADL Screening (condition at time of admission) Patient's cognitive ability adequate to safely complete daily activities?: Yes Is the patient deaf or have difficulty hearing?: Yes Does the patient have difficulty seeing, even when wearing glasses/contacts?: No Does the patient have difficulty concentrating, remembering, or making decisions?: No  Permission Sought/Granted Permission sought to share information with : Family Supports, Other (comment) (HH/DME companies) Permission granted to share information with : Yes, Verbal Permission Granted     Permission granted to share info w AGENCY: HH/DME  Permission granted to share info w Relationship: Daughter     Emotional Assessment Appearance:: Appears stated age Attitude/Demeanor/Rapport: Gracious Affect (typically observed): Accepting Orientation: : Oriented to Self, Oriented to Place, Oriented to  Time, Oriented to Situation Alcohol / Substance Use: Not Applicable Psych Involvement: No (comment)  Admission diagnosis:  RSV (acute bronchiolitis due to respiratory syncytial virus) [J21.0] Respiratory distress [R06.03] COPD exacerbation (HCC) [J44.1] Atrial fibrillation with rapid ventricular response (HCC) [I48.91] Patient Active Problem List   Diagnosis Date Noted   RSV (respiratory syncytial  virus pneumonia) 03/11/2022   COPD exacerbation ([place]) 03/09/2022   Paroxysmal atrial fibrillation with RVR (HCC) 03/09/2022   Acute respiratory failure with hypoxia (HCC) 03/09/2022   Bronchiectasis with acute exacerbation ([place])  03/04/2022   URI (upper respiratory infection) 03/04/2022   Elevated troponin    Anemia 08/08/2021   Bladder spasm 08/08/2021   Chest pain 08/08/2021   Chest pain, rule out acute myocardial infarction 08/07/2021   Chronic heart failure with preserved ejection fraction ([place]) 02/03/2021   Cognitive decline 01/18/2021   Right knee pain 06/08/2020   Age-related osteoporosis without current pathological fracture 05/01/2020   Nasal congestion 05/01/2020   Personal history of COVID-19 05/01/2020   Bilateral hearing loss 11/15/2019   Post-nasal drainage 11/15/2019   COPD (chronic obstructive pulmonary disease) ([place]) 10/30/2019   Atrial fibrillation (HCC)    Adhesive capsulitis of right shoulder 42/35/3614   Metabolic encephalopathy 43/15/4008   Leukocytosis 07/18/2019   S/P reverse total shoulder arthroplasty, 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   Rotator cuff arthropathy of left shoulder 05/03/2019   Shoulder pain, left 04/29/2019   Low back pain 03/13/2019   Degeneration of lumbar intervertebral disc 02/14/2019   Amnesia 02/06/2018   ETD (Eustachian tube dysfunction), bilateral 11/07/2017   Chronic nonintractable headache 11/07/2017   Otalgia 10/17/2017   Pain in joint of right elbow 05/02/2017   Rib lesion 04/20/2017   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   Atopic dermatitis 04/08/2016   Skin sensation disturbance 03/08/2016   Weakness 12/22/2015   Allergic rhinitis 09/15/2015   CAD (coronary artery disease) 05/18/2015   Essential hypertension 05/18/2015   Carotid artery disease ([place]) 05/18/2015   Cough 03/19/2015   Shortness of breath 12/26/2013   Abnormal gait 11/06/2013   Scoliosis (and kyphoscoliosis), idiopathic 07/30/2013   Acquired unequal leg length on left 04/11/2013   History of fracture 10/18/2012   Palpitations 10/18/2012   Constipation 09/05/2012   History of sinus tachycardia 09/05/2012   Spasm of back muscles 03/12/2012   Lumbar radiculopathy 11/23/2011    Mitral regurgitation 11/22/2011   Low compliance bladder 04/18/2011   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   Carpal tunnel syndrome 02/23/2009   Cardiovascular symptoms 02/23/2009   Vitamin D deficiency 02/23/2009   RECTAL BLEEDING 08/18/2008   PERSONAL HX COLONIC POLYPS 08/18/2008   SKIN CANCER, HX OF 08/14/2008   CARPAL TUNNEL SYNDROME, HX OF 08/14/2008   SARCOIDOSIS, PULMONARY 08/08/2008   HLD (hyperlipidemia) 08/08/2008   Paroxysmal supraventricular tachycardia ([place]) 08/08/2008   GERD (gastroesophageal reflux disease) 08/08/2008   PCP:  [name], MD Pharmacy:   [facility] [place], [place] - [address] AT [address] [place] [address] Phone: [phone] Fax: [phone]  [facility] - [place], [place] [address] Phone: [phone] Fax: [phone]     Social Determinants of Health (SDOH) Social History: SDOH Screenings   Tobacco Use: Low Risk  (03/10/2022)   SDOH Interventions:     Readmission Risk Interventions    09/26/2019   10:56 AM 08/01/2019    4:14 PM  Readmission Risk Prevention Plan  Transportation Screening Complete Complete  PCP or Specialist Appt within 5-7 Days  Complete  PCP or Specialist Appt within 3-5 Days Complete   Home Care Screening  Complete  Medication Review (RN CM)  Referral to Pharmacy  HRI or Home Care Consult Complete   Social Work Consult for [place] Planning/Counseling  Complete   Palliative Care Screening Not Applicable   Medication Review (RN Care Manager) Complete

## 2022-03-16 DIAGNOSIS — J441 Chronic obstructive pulmonary disease with (acute) exacerbation: Secondary | ICD-10-CM | POA: Diagnosis not present

## 2022-03-16 DIAGNOSIS — I5032 Chronic diastolic (congestive) heart failure: Secondary | ICD-10-CM | POA: Diagnosis not present

## 2022-03-16 DIAGNOSIS — J121 Respiratory syncytial virus pneumonia: Secondary | ICD-10-CM | POA: Diagnosis not present

## 2022-03-16 DIAGNOSIS — I480 Paroxysmal atrial fibrillation: Secondary | ICD-10-CM | POA: Diagnosis not present

## 2022-03-16 LAB — CBC WITH DIFFERENTIAL/PLATELET
Abs Immature Granulocytes: 0.7 10*3/uL — ABNORMAL HIGH (ref 0.00–0.07)
Basophils Absolute: 0 10*3/uL (ref 0.0–0.1)
Basophils Relative: 0 %
Eosinophils Absolute: 0 10*3/uL (ref 0.0–0.5)
Eosinophils Relative: 0 %
HCT: 38.5 % (ref 36.0–46.0)
Hemoglobin: 12.6 g/dL (ref 12.0–15.0)
Immature Granulocytes: 4 %
Lymphocytes Relative: 10 %
Lymphs Abs: 1.8 10*3/uL (ref 0.7–4.0)
MCH: 28.4 pg (ref 26.0–34.0)
MCHC: 32.7 g/dL (ref 30.0–36.0)
MCV: 86.7 fL (ref 80.0–100.0)
Monocytes Absolute: 1.1 10*3/uL — ABNORMAL HIGH (ref 0.1–1.0)
Monocytes Relative: 6 %
Neutro Abs: 14.2 10*3/uL — ABNORMAL HIGH (ref 1.7–7.7)
Neutrophils Relative %: 80 %
Platelets: 288 10*3/uL (ref 150–400)
RBC: 4.44 MIL/uL (ref 3.87–5.11)
RDW: 16 % — ABNORMAL HIGH (ref 11.5–15.5)
WBC: 17.9 10*3/uL — ABNORMAL HIGH (ref 4.0–10.5)
nRBC: 0 % (ref 0.0–0.2)

## 2022-03-16 LAB — BASIC METABOLIC PANEL
Anion gap: 8 (ref 5–15)
BUN: 27 mg/dL — ABNORMAL HIGH (ref 8–23)
CO2: 26 mmol/L (ref 22–32)
Calcium: 8.3 mg/dL — ABNORMAL LOW (ref 8.9–10.3)
Chloride: 104 mmol/L (ref 98–111)
Creatinine, Ser: 0.85 mg/dL (ref 0.44–1.00)
GFR, Estimated: >60 mL/min (ref >60)
Glucose, Bld: 78 mg/dL (ref 70–99)
Potassium: 4.6 mmol/L (ref 3.5–5.1)
Sodium: 138 mmol/L (ref 135–145)

## 2022-03-16 LAB — PROCALCITONIN: Procalcitonin: <0.1 ng/mL

## 2022-03-16 LAB — MAGNESIUM: Magnesium: 2.2 mg/dL (ref 1.7–2.4)

## 2022-03-16 MED ORDER — PANTOPRAZOLE SODIUM 40 MG PO TBEC
40.0000 mg | DELAYED_RELEASE_TABLET | Freq: Two times a day (BID) | ORAL | Status: DC
  Administered 2022-03-16 – 2022-03-18 (×5): 40 mg via ORAL
  Filled 2022-03-16 (×5): qty 1

## 2022-03-16 NOTE — Progress Notes (Signed)
Physical Therapy Treatment Patient Details Name: [name] MRN: [id number] DOB: 1934-11-05 Today's Date: 03/16/2022   History of Present Illness Patient is a 88 y/o female who presents on 1/17 with SOB. Found to have acute respiratory failure secondary to RSV infection. PMH includes NSTEMI, CAD, HTN, COPD, sarcoid bronchiestasis, A-fib, CHF.    PT Comments    Pt tolerated today's session well, limited by fatigue but recovers well with seated rest breaks. Pt cued for upright posture when in chair to fully expand lungs, pt compliant with cueing. Pt able to ambulate 15 feet x2 trials today with min guard and RW, desatting to 87% on 2L O2 but quickly recovering once seated. Acute PT will continue to follow pt to progress mobility and endurance, pt educated on importance of progressing endurance, verbalizing understanding. Discharge recommendations remain appropriate.     Recommendations for follow up therapy are one component of a multi-disciplinary discharge planning process, led by the attending physician.  Recommendations may be updated based on patient status, additional functional criteria and insurance authorization.  Follow Up Recommendations  Home health PT     Assistance Recommended at Discharge Intermittent Supervision/Assistance  Patient can return home with the following A little help with walking and/or transfers;A little help with bathing/dressing/bathroom;Assist for transportation;Assistance with cooking/housework;Direct supervision/assist for medications management;Direct supervision/assist for financial management   Equipment Recommendations  None recommended by PT    Recommendations for Other Services OT consult     Precautions / Restrictions Precautions Precautions: Fall Precaution Comments: watch 02 Restrictions Weight Bearing Restrictions: No     Mobility  Bed Mobility Overal bed mobility: Needs Assistance Bed Mobility: Sit to Supine     Supine to sit:  Supervision, HOB elevated     General bed mobility comments: increased time, no assist    Transfers Overall transfer level: Needs assistance Equipment used: Rolling walker (2 wheels) Transfers: Sit to/from Stand Sit to Stand: Min assist   Step pivot transfers: Min guard       General transfer comment: assist to rise and steady, increased time    Ambulation/Gait Ambulation/Gait assistance: Min guard Gait Distance (Feet): 15 Feet (5 feet to chair then 2 trials of 15 feet, seated rest breaks between each trial) Assistive device: Rolling walker (2 wheels) Gait Pattern/deviations: Step-through pattern, Decreased stride length, Trunk flexed Gait velocity: decreased     General Gait Details: decreased gait speed with downward gaze, mild path deviation, fatiguing with mobility but recovers with seated rest break   Stairs             Wheelchair Mobility    Modified Rankin (Stroke Patients Only)       Balance Overall balance assessment: Needs assistance Sitting-balance support: Feet supported, No upper extremity supported Sitting balance-Leahy Scale: Fair Sitting balance - Comments: no sway noted   Standing balance support: During functional activity Standing balance-Leahy Scale: Fair Standing balance comment: use of RW for balance during ambulation                            Cognition Arousal/Alertness: Awake/alert Behavior During Therapy: WFL for tasks assessed/performed Overall Cognitive Status: Within Functional Limits for tasks assessed Area of Impairment: Memory                     Memory: Decreased short-term memory         General Comments: WFL for basic mobility tasks  Exercises      General Comments General comments (skin integrity, edema, etc.): SPO2 desatting to 87% on 2L O2 [place] for ambulation, left on 1L with pt satting in 90's      Pertinent Vitals/Pain Pain Assessment Pain Assessment: No/denies pain Pain  Location: R knee with longstanding OA Pain Descriptors / Indicators: Aching    Home Living                          Prior Function            PT Goals (current goals can now be found in the care plan section) Acute Rehab PT Goals Patient Stated Goal: to go home PT Goal Formulation: With patient Time For Goal Achievement: 03/25/22 Potential to Achieve Goals: Good Progress towards PT goals: Progressing toward goals    Frequency    Min 3X/week      PT Plan Current plan remains appropriate    Co-evaluation              AM-PAC PT "6 Clicks" Mobility   Outcome Measure  Help needed turning from your back to your side while in a flat bed without using bedrails?: A Little Help needed moving from lying on your back to sitting on the side of a flat bed without using bedrails?: A Little Help needed moving to and from a bed to a chair (including a wheelchair)?: A Little Help needed standing up from a chair using your arms (e.g., wheelchair or bedside chair)?: A Little Help needed to walk in hospital room?: A Little Help needed climbing 3-5 steps with a railing? : A Lot 6 Click Score: 17    End of Session Equipment Utilized During Treatment: Gait belt;Oxygen Activity Tolerance: Patient limited by fatigue Patient left: in chair;with call bell/phone within reach;with chair alarm set Nurse Communication: Mobility status PT Visit Diagnosis: Muscle weakness (generalized) (M62.81);Difficulty in walking, not elsewhere classified (R26.2)     Time: 8315-1761 PT Time Calculation (min) (ACUTE ONLY): 19 min  Charges:  $Therapeutic Activity: 8-22 mins                     Charlynne Cousins, PT DPT Acute Rehabilitation Services Office [phone]    Luvenia Heller 03/16/2022, 1:42 PM

## 2022-03-16 NOTE — Progress Notes (Signed)
Occupational Therapy Treatment Patient Details Name: RIYA HUXFORD MRN: [id number] DOB: [birth year] Today's Date: 03/16/2022   History of present illness Patient is a 88 y/o female who presents on 1/17 with SOB. Found to have acute respiratory failure secondary to RSV infection. PMH includes NSTEMI, CAD, HTN, COPD, sarcoid bronchiestasis, A-fib, CHF.   OT comments  Pt with poor activity tolerance with minimal exertion requiring seated rest breaks. Pt stood with mod assist from recliner, ambulated to [facility] and sink with RW and min guard assist. Stood for 2 grooming activities and returned to bed having been up in chair much of the morning. Poor pleth for O2 reading, HR 57.    Recommendations for follow up therapy are one component of a multi-disciplinary discharge planning process, led by the attending physician.  Recommendations may be updated based on patient status, additional functional criteria and insurance authorization.    Follow Up Recommendations  No OT follow up     Assistance Recommended at Discharge Intermittent Supervision/Assistance  Patient can return home with the following  Assist for transportation;Assistance with cooking/housework;A little help with walking and/or transfers;A little help with bathing/dressing/bathroom   Equipment Recommendations  None recommended by OT    Recommendations for Other Services      Precautions / Restrictions Precautions Precautions: Fall       Mobility Bed Mobility Overal bed mobility: Needs Assistance Bed Mobility: Sit to Supine       Sit to supine: Supervision   General bed mobility comments: increased time, no assist    Transfers Overall transfer level: Needs assistance Equipment used: Rolling walker (2 wheels) Transfers: Sit to/from Stand Sit to Stand: Mod assist           General transfer comment: assist to rise and steady, increased time     Balance Overall balance assessment: Needs assistance   Sitting  balance-Leahy Scale: Fair     Standing balance support: During functional activity Standing balance-Leahy Scale: Fair Standing balance comment: use of RW for balance during ambulation, fair static batance at sink                           ADL either performed or assessed with clinical judgement   ADL Overall ADL's : Needs assistance/impaired Eating/Feeding: Independent;Sitting   Grooming: Supervision/safety;Standing;Oral care;Wash/dry hands;Wash/dry face;Sitting Grooming Details (indicate cue type and reason): pt needing to sit after walking from chair to sink, stood to complete grooming                 Toilet Transfer: Min guard;Ambulation;Rolling walker (2 wheels);BSC/3in1   Toileting- Clothing Manipulation and Hygiene: Minimal assistance;Sit to/from stand       Functional mobility during ADLs: Min guard;Rolling walker (2 wheels)      Extremity/Trunk Assessment              Vision       Perception     Praxis      Cognition Arousal/Alertness: Awake/alert Behavior During Therapy: WFL for tasks assessed/performed Overall Cognitive Status: Within Functional Limits for tasks assessed                                          Exercises      Shoulder Instructions       General Comments      Pertinent Vitals/ Pain  Pain Assessment Pain Assessment: Faces Faces Pain Scale: No hurt  Home Living                                          Prior Functioning/Environment              Frequency  Min 2X/week        Progress Toward Goals  OT Goals(current goals can now be found in the care plan section)  Progress towards OT goals: Progressing toward goals  Acute Rehab OT Goals OT Goal Formulation: With patient Time For Goal Achievement: 03/28/22 Potential to Achieve Goals: Good  Plan Discharge plan remains appropriate    Co-evaluation                 AM-PAC OT "6 Clicks" Daily  Activity     Outcome Measure   Help from another person eating meals?: None Help from another person taking care of personal grooming?: A Little Help from another person toileting, which includes using toliet, bedpan, or urinal?: A Little Help from another person bathing (including washing, rinsing, drying)?: A Little Help from another person to put on and taking off regular upper body clothing?: None Help from another person to put on and taking off regular lower body clothing?: A Little 6 Click Score: 20    End of Session Equipment Utilized During Treatment: Gait belt;Rolling walker (2 wheels)  OT Visit Diagnosis: Unsteadiness on feet (R26.81);Muscle weakness (generalized) (M62.81)   Activity Tolerance Patient limited by fatigue   Patient Left in bed;with call bell/phone within reach;with bed alarm set   Nurse Communication Other (comment) (ok to give cup of water)        Time: 1250-1314 OT Time Calculation (min): 24 min  Charges: OT General Charges $OT Visit: 1 Visit OT Treatments $Self Care/Home Management : 23-37 mins  Cleta Alberts, OTR/L Acute Rehabilitation Services Office: [phone]   Malka So 03/16/2022, 1:36 PM

## 2022-03-16 NOTE — Progress Notes (Signed)
PROGRESS NOTE    KENDRIA HALBERG  KKX:[id number] DOB: Oct 29, 1934 DOA: 03/09/2022 PCP: [name], MD   Brief Narrative:  88 yo female with PMH A-fib, PSVT, pulmonary sarcoid, scoliosis, HLD, HTN, COPD, bronchiectasis, CAD, low back pain, anemia, GERD, diastolic CHF presented with shortness of breath. On workup in the ER she was found to be in A-fib with RVR and hypoxic initially requiring BiPAP which was able to be weaned down to 6 L. Viral swab testing also was positive for RSV. Cardiology was consulted and she was started on an amiodarone drip due to soft blood pressures which was subsequently transitioned to oral amiodarone.  PCCM was also consulted for respiratory status and patient was treated with IV steroids.  Assessment & Plan:   COPD exacerbation Acute respiratory failure with hypoxia History of bronchiectasis/pulmonary sarcoidosis RSV infection -Respiratory status has much improved but still on 1 L oxygen via nasal cannula.  Might need supplemental oxygen on discharge. -Currently on Solu-Medrol 40 mg IV daily.  PCCM recommended tapering steroids on discharge and signed off. -Continue current nebs  Paroxysmal A-fib with RVR History of PSVT -Currently rate mostly controlled.  Currently on Eliquis, oral amiodarone and metoprolol.  Cardiology has signed off.  Outpatient follow-up with cardiology -Echo showed EF of 60 to 65% with grade 2 diastolic dysfunction  Chronic diastolic CHF Compensated.  Continue beta-blocker, lisinopril.  Strict input and output.  Daily weights.  Fluid restriction.  Outpatient follow-up with cardiology  CAD Hyperlipidemia Hypertension -Blood pressure intermittently on the higher side.  Continue aspirin, beta-blocker, lisinopril and metoprolol  GERD -Continue PPI  Physical deconditioning -Will need home health PT   DVT prophylaxis: Eliquis Code Status: DNR Family Communication: None at bedside Disposition Plan: Status is:  Inpatient Remains inpatient appropriate because: Home in 1 to 2 days if clinically improves and respite status continues to improve  Consultants: PCCM/cardiology  Procedures: Echo  Antimicrobials: None   Subjective: Patient seen and examined at bedside.  He is slightly better but still short of breath with exertion and has intermittent cough.  No fever or chest pain or vomiting reported.  Objective: Vitals:   03/16/22 0007 03/16/22 0353 03/16/22 0500 03/16/22 0832  BP: (!) 167/67 (!) 168/78  (!) 125/58  Pulse: 60 60  (!) 53  Resp: '16 14  16  '$ Temp: 97.7 F (36.5 C) 97.7 F (36.5 C)  97.9 F (36.6 C)  TempSrc: Oral Oral  Oral  SpO2: 97% 96%  95%  Weight:   56.8 kg   Height:        Intake/Output Summary (Last 24 hours) at 03/16/2022 1059 Last data filed at 03/16/2022 1015 Gross per 24 hour  Intake 120 ml  Output 500 ml  Net -380 ml   Filed Weights   03/14/22 0515 03/16/22 0500  Weight: 55.6 kg 56.8 kg    Examination:  General exam: Appears calm and comfortable.  Looks chronically ill and deconditioned.  On 1 L oxygen via nasal cannula. Respiratory system: Bilateral decreased breath sounds at bases with scattered crackles Cardiovascular system: S1 & S2 heard, mild intermittent bradycardia present gastrointestinal system: Abdomen is nondistended, soft and nontender. Normal bowel sounds heard. Extremities: No cyanosis, clubbing; trace lower extremity edema Central nervous system: Alert and oriented.  Slow to respond.  No focal neurological deficits. Moving extremities Skin: No rashes, lesions or ulcers Psychiatry: Flat affect.  Not agitated.   Data Reviewed: I have personally reviewed following labs and imaging studies  CBC: Recent Labs  Lab 03/12/22 0439 03/13/22 0309 03/14/22 0211 03/15/22 0705 03/16/22 0748  WBC 14.2* 13.7* 18.2* 19.6* 17.9*  NEUTROABS 11.6* 12.4* 16.6* 16.2* 14.2*  HGB 11.7* 11.5* 11.6* 11.9* 12.6  HCT 36.8 35.8* 36.8 36.3 38.5  MCV 87.8  86.9 87.8 88.1 86.7  PLT 248 233 270 274 948   Basic Metabolic Panel: Recent Labs  Lab 03/12/22 0439 03/13/22 0309 03/14/22 0211 03/15/22 0705 03/16/22 0748  NA 138 135 135 134* 138  K 3.9 4.5 4.3 4.6 4.6  CL 106 105 105 103 104  CO2 23 21* '24 23 26  '$ GLUCOSE 76 187* 147* 74 78  BUN 32* 30* 29* 24* 27*  CREATININE 0.89 0.87 0.91 0.79 0.85  CALCIUM 8.4* 8.3* 8.4* 8.3* 8.3*  MG 2.1 2.1 2.2 2.2 2.2   GFR: Estimated Creatinine Clearance: 40 mL/min (by C-G formula based on SCr of 0.85 mg/dL). Liver Function Tests: Recent Labs  Lab 03/09/22 1325 03/10/22 0400  AST 24 15  ALT 15 14  ALKPHOS 78 64  BILITOT 0.5 0.6  PROT 5.9* 5.1*  ALBUMIN 3.3* 2.8*   No results for input(s): "LIPASE", "AMYLASE" in the last 168 hours. No results for input(s): "AMMONIA" in the last 168 hours. Coagulation Profile: No results for input(s): "INR", "PROTIME" in the last 168 hours. Cardiac Enzymes: No results for input(s): "CKTOTAL", "CKMB", "CKMBINDEX", "TROPONINI" in the last 168 hours. BNP (last 3 results) No results for input(s): "PROBNP" in the last 8760 hours. HbA1C: No results for input(s): "HGBA1C" in the last 72 hours. CBG: No results for input(s): "GLUCAP" in the last 168 hours. Lipid Profile: No results for input(s): "CHOL", "HDL", "LDLCALC", "TRIG", "CHOLHDL", "LDLDIRECT" in the last 72 hours. Thyroid Function Tests: No results for input(s): "TSH", "T4TOTAL", "FREET4", "T3FREE", "THYROIDAB" in the last 72 hours. Anemia Panel: No results for input(s): "VITAMINB12", "FOLATE", "FERRITIN", "TIBC", "IRON", "RETICCTPCT" in the last 72 hours. Sepsis Labs: Recent Labs  Lab 03/14/22 0211 03/15/22 0705 03/16/22 0748  PROCALCITON <0.10 <0.10 <0.10    Recent Results (from the past 240 hour(s))  Resp panel by RT-PCR (RSV, Flu A&B, Covid) Anterior Nasal Swab     Status: Abnormal   Collection Time: 03/09/22  1:25 PM   Specimen: Anterior Nasal Swab  Result Value Ref Range Status   SARS  Coronavirus 2 by RT PCR NEGATIVE NEGATIVE Final    Comment: (NOTE) SARS-CoV-2 target nucleic acids are NOT DETECTED.  The SARS-CoV-2 RNA is generally detectable in upper respiratory specimens during the acute phase of infection. The lowest concentration of SARS-CoV-2 viral copies this assay can detect is 138 copies/mL. A negative result does not preclude SARS-Cov-2 infection and should not be used as the sole basis for treatment or other patient management decisions. A negative result may occur with  improper specimen collection/handling, submission of specimen other than nasopharyngeal swab, presence of viral mutation(s) within the areas targeted by this assay, and inadequate number of viral copies(<138 copies/mL). A negative result must be combined with clinical observations, patient history, and epidemiological information. The expected result is Negative.  Fact Sheet for Patients:  EntrepreneurPulse.com.au  Fact Sheet for Healthcare Providers:  IncredibleEmployment.be  This test is no t yet approved or cleared by the [place] FDA and  has been authorized for detection and/or diagnosis of SARS-CoV-2 by FDA under an Emergency Use Authorization (EUA). This EUA will remain  in effect (meaning this test can be used) for the duration of the COVID-19 declaration under Section 564(b)(1) of the Act, 21  U.S.C.section 360bbb-3(b)(1), unless the authorization is terminated  or revoked sooner.       Influenza A by PCR NEGATIVE NEGATIVE Final   Influenza B by PCR NEGATIVE NEGATIVE Final    Comment: (NOTE) The Xpert Xpress SARS-CoV-2/FLU/RSV plus assay is intended as an aid in the diagnosis of influenza from Nasopharyngeal swab specimens and should not be used as a sole basis for treatment. Nasal washings and aspirates are unacceptable for Xpert Xpress SARS-CoV-2/FLU/RSV testing.  Fact Sheet for  Patients: EntrepreneurPulse.com.au  Fact Sheet for Healthcare Providers: IncredibleEmployment.be  This test is not yet approved or cleared by the [place] FDA and has been authorized for detection and/or diagnosis of SARS-CoV-2 by FDA under an Emergency Use Authorization (EUA). This EUA will remain in effect (meaning this test can be used) for the duration of the COVID-19 declaration under Section 564(b)(1) of the Act, 21 U.S.C. section 360bbb-3(b)(1), unless the authorization is terminated or revoked.     Resp Syncytial Virus by PCR POSITIVE (A) NEGATIVE Final    Comment: (NOTE) Fact Sheet for Patients: EntrepreneurPulse.com.au  Fact Sheet for Healthcare Providers: IncredibleEmployment.be  This test is not yet approved or cleared by the [place] FDA and has been authorized for detection and/or diagnosis of SARS-CoV-2 by FDA under an Emergency Use Authorization (EUA). This EUA will remain in effect (meaning this test can be used) for the duration of the COVID-19 declaration under Section 564(b)(1) of the Act, 21 U.S.C. section 360bbb-3(b)(1), unless the authorization is terminated or revoked.  Performed at [facility], [place] [address], [place], [place] [address]          Radiology Studies: No results found.      Scheduled Meds:  amiodarone  200 mg Oral BID   Followed by   Derrill Memo ON 03/18/2022] amiodarone  200 mg Oral BID   apixaban  2.5 mg Oral BID   arformoterol  15 mcg Nebulization BID   aspirin  81 mg Oral Daily   budesonide (PULMICORT) nebulizer solution  0.25 mg Nebulization BID   guaiFENesin  1,200 mg Oral BID   lisinopril  20 mg Oral Daily   methylPREDNISolone (SOLU-MEDROL) injection  40 mg Intravenous Daily   metoprolol tartrate  25 mg Oral BID   pantoprazole  40 mg Oral Daily   revefenacin  175 mcg Nebulization Daily   sodium chloride flush  3 mL Intravenous Q12H    Continuous Infusions:        Aline August, MD [facility] 03/16/2022, 10:59 AM

## 2022-03-16 NOTE — Progress Notes (Signed)
Pt said does not wear cpap at home. No distress noted

## 2022-03-17 DIAGNOSIS — J121 Respiratory syncytial virus pneumonia: Secondary | ICD-10-CM | POA: Diagnosis not present

## 2022-03-17 LAB — CBC WITH DIFFERENTIAL/PLATELET
Abs Immature Granulocytes: 0.65 10*3/uL — ABNORMAL HIGH (ref 0.00–0.07)
Basophils Absolute: 0 10*3/uL (ref 0.0–0.1)
Basophils Relative: 0 %
Eosinophils Absolute: 0 10*3/uL (ref 0.0–0.5)
Eosinophils Relative: 0 %
HCT: 37.5 % (ref 36.0–46.0)
Hemoglobin: 12.2 g/dL (ref 12.0–15.0)
Immature Granulocytes: 4 %
Lymphocytes Relative: 8 %
Lymphs Abs: 1.4 10*3/uL (ref 0.7–4.0)
MCH: 28 pg (ref 26.0–34.0)
MCHC: 32.5 g/dL (ref 30.0–36.0)
MCV: 86 fL (ref 80.0–100.0)
Monocytes Absolute: 1 10*3/uL (ref 0.1–1.0)
Monocytes Relative: 6 %
Neutro Abs: 14.7 10*3/uL — ABNORMAL HIGH (ref 1.7–7.7)
Neutrophils Relative %: 82 %
Platelets: 275 10*3/uL (ref 150–400)
RBC: 4.36 MIL/uL (ref 3.87–5.11)
RDW: 15.9 % — ABNORMAL HIGH (ref 11.5–15.5)
WBC: 17.8 10*3/uL — ABNORMAL HIGH (ref 4.0–10.5)
nRBC: 0 % (ref 0.0–0.2)

## 2022-03-17 LAB — MAGNESIUM: Magnesium: 2.3 mg/dL (ref 1.7–2.4)

## 2022-03-17 LAB — BASIC METABOLIC PANEL
Anion gap: 5 (ref 5–15)
BUN: 25 mg/dL — ABNORMAL HIGH (ref 8–23)
CO2: 27 mmol/L (ref 22–32)
Calcium: 8.4 mg/dL — ABNORMAL LOW (ref 8.9–10.3)
Chloride: 103 mmol/L (ref 98–111)
Creatinine, Ser: 0.79 mg/dL (ref 0.44–1.00)
GFR, Estimated: >60 mL/min (ref >60)
Glucose, Bld: 92 mg/dL (ref 70–99)
Potassium: 4.6 mmol/L (ref 3.5–5.1)
Sodium: 135 mmol/L (ref 135–145)

## 2022-03-17 NOTE — Plan of Care (Signed)

## 2022-03-17 NOTE — Progress Notes (Signed)
Mobility Specialist Progress Note:   03/17/22 1020  Mobility  Activity  (bed level ex)  Level of Assistance Standby assist, set-up cues, supervision of patient - no hands on  Range of Motion/Exercises Active;All extremities  Activity Response Tolerated well  Mobility Referral Yes  $Mobility charge 1 Mobility   Pt politely declining any OOB mobility, states she just transferred back to bed from chair. Agreeable to bed level ex. Encouraged IS and flutter use. Pt left in bed with all needs met.   Nelta Numbers Mobility Specialist Please contact via SecureChat or  Rehab office at [phone]

## 2022-03-17 NOTE — Progress Notes (Signed)
PROGRESS NOTE    [name]  YQI:[id number] DOB: 24-May-1934 DOA: 03/09/2022 PCP: [name], MD   Brief Narrative:  88 yo female with PMH A-fib, PSVT, pulmonary sarcoid, scoliosis, HLD, HTN, COPD, bronchiectasis, CAD, low back pain, anemia, GERD, diastolic CHF presented with shortness of breath. On workup in the ER she was found to be in A-fib with RVR and hypoxic initially requiring BiPAP which was able to be weaned down to 6 L. Viral swab testing also was positive for RSV. Cardiology was consulted and she was started on an amiodarone drip due to soft blood pressures which was subsequently transitioned to oral amiodarone.  PCCM was also consulted for respiratory status and patient was treated with IV steroids.  Assessment & Plan:   COPD exacerbation Acute respiratory failure with hypoxia History of bronchiectasis/pulmonary sarcoidosis RSV infection -Respiratory status has much improved but still on 1 L oxygen via nasal cannula.  Might need supplemental oxygen on discharge. -Currently on Solu-Medrol 40 mg IV daily.  PCCM recommended tapering steroids on discharge and signed off. -Continue current nebs  Paroxysmal A-fib with RVR History of PSVT -Currently rate mostly controlled.  Currently on Eliquis, oral amiodarone and metoprolol.  Cardiology has signed off.  Outpatient follow-up with cardiology -Echo showed EF of 60 to 65% with grade 2 diastolic dysfunction  Chronic diastolic CHF Compensated.  Continue beta-blocker, lisinopril.  Strict input and output.  Daily weights.  Fluid restriction.  Outpatient follow-up with cardiology  Leukocytosis -Probably reactive.  CAD Hyperlipidemia Hypertension -Blood pressure intermittently on the higher side.  Continue aspirin, beta-blocker, lisinopril and metoprolol  GERD -Continue PPI  Physical deconditioning -Will need home health PT   DVT prophylaxis: Eliquis Code Status: DNR Family Communication: None at  bedside Disposition Plan: Status is: Inpatient Remains inpatient appropriate because: Home in 1 to 2 days if clinically improves and respiratory status continues to improve  Consultants: PCCM/cardiology  Procedures: Echo  Antimicrobials: None   Subjective: Patient seen and examined at bedside.  Still has significant intermittent cough with shortness of breath and does not feel ready to go home today.  No fever, chest pain, vomiting reported.   Objective: Vitals:   03/16/22 2350 03/17/22 0310 03/17/22 0500 03/17/22 0855  BP: 139/68 (!) 157/63    Pulse: (!) 58 (!) 51    Resp: 18 (!) 21    Temp: 98.2 F (36.8 C) 97.9 F (36.6 C)    TempSrc: Oral Oral    SpO2: 96% 96%  96%  Weight:   57.6 kg   Height:        Intake/Output Summary (Last 24 hours) at 03/17/2022 0925 Last data filed at 03/16/2022 2136 Gross per 24 hour  Intake 363 ml  Output 500 ml  Net -137 ml    Filed Weights   03/14/22 0515 03/16/22 0500 03/17/22 0500  Weight: 55.6 kg 56.8 kg 57.6 kg    Examination:  General: On 1 L oxygen by nasal cannula.  No distress.  Looks chronically ill and deconditioned respiratory: Decreased breath sounds at bases bilaterally with some crackles and some wheezing with intermittent tachypnea CVS: Currently rate controlled; mild intermittent bradycardia present  abdominal: Soft, nontender, slightly distended, no organomegaly; bowel sounds are heard  extremities: Trace lower extremity edema; no clubbing.      Data Reviewed: I have personally reviewed following labs and imaging studies  CBC: Recent Labs  Lab 03/13/22 0309 03/14/22 0211 03/15/22 0705 03/16/22 0748 03/17/22 0502  WBC 13.7* 18.2* 19.6* 17.9* 17.8*  NEUTROABS 12.4* 16.6* 16.2* 14.2* 14.7*  HGB 11.5* 11.6* 11.9* 12.6 12.2  HCT 35.8* 36.8 36.3 38.5 37.5  MCV 86.9 87.8 88.1 86.7 86.0  PLT 233 270 274 288 956    Basic Metabolic Panel: Recent Labs  Lab 03/13/22 0309 03/14/22 0211 03/15/22 0705  03/16/22 0748 03/17/22 0502  NA 135 135 134* 138 135  K 4.5 4.3 4.6 4.6 4.6  CL 105 105 103 104 103  CO2 21* '24 23 26 27  '$ GLUCOSE 187* 147* 74 78 92  BUN 30* 29* 24* 27* 25*  CREATININE 0.87 0.91 0.79 0.85 0.79  CALCIUM 8.3* 8.4* 8.3* 8.3* 8.4*  MG 2.1 2.2 2.2 2.2 2.3    GFR: Estimated Creatinine Clearance: 42.5 mL/min (by C-G formula based on SCr of 0.79 mg/dL). Liver Function Tests: No results for input(s): "AST", "ALT", "ALKPHOS", "BILITOT", "PROT", "ALBUMIN" in the last 168 hours.  No results for input(s): "LIPASE", "AMYLASE" in the last 168 hours. No results for input(s): "AMMONIA" in the last 168 hours. Coagulation Profile: No results for input(s): "INR", "PROTIME" in the last 168 hours. Cardiac Enzymes: No results for input(s): "CKTOTAL", "CKMB", "CKMBINDEX", "TROPONINI" in the last 168 hours. BNP (last 3 results) No results for input(s): "PROBNP" in the last 8760 hours. HbA1C: No results for input(s): "HGBA1C" in the last 72 hours. CBG: No results for input(s): "GLUCAP" in the last 168 hours. Lipid Profile: No results for input(s): "CHOL", "HDL", "LDLCALC", "TRIG", "CHOLHDL", "LDLDIRECT" in the last 72 hours. Thyroid Function Tests: No results for input(s): "TSH", "T4TOTAL", "FREET4", "T3FREE", "THYROIDAB" in the last 72 hours. Anemia Panel: No results for input(s): "VITAMINB12", "FOLATE", "FERRITIN", "TIBC", "IRON", "RETICCTPCT" in the last 72 hours. Sepsis Labs: Recent Labs  Lab 03/14/22 0211 03/15/22 0705 03/16/22 0748  PROCALCITON <0.10 <0.10 <0.10     Recent Results (from the past 240 hour(s))  Resp panel by RT-PCR (RSV, Flu A&B, Covid) Anterior Nasal Swab     Status: Abnormal   Collection Time: 03/09/22  1:25 PM   Specimen: Anterior Nasal Swab  Result Value Ref Range Status   SARS Coronavirus 2 by RT PCR NEGATIVE NEGATIVE Final    Comment: (NOTE) SARS-CoV-2 target nucleic acids are NOT DETECTED.  The SARS-CoV-2 RNA is generally detectable in upper  respiratory specimens during the acute phase of infection. The lowest concentration of SARS-CoV-2 viral copies this assay can detect is 138 copies/mL. A negative result does not preclude SARS-Cov-2 infection and should not be used as the sole basis for treatment or other patient management decisions. A negative result may occur with  improper specimen collection/handling, submission of specimen other than nasopharyngeal swab, presence of viral mutation(s) within the areas targeted by this assay, and inadequate number of viral copies(<138 copies/mL). A negative result must be combined with clinical observations, patient history, and epidemiological information. The expected result is Negative.  Fact Sheet for Patients:  EntrepreneurPulse.com.au  Fact Sheet for Healthcare Providers:  IncredibleEmployment.be  This test is no t yet approved or cleared by the [place] FDA and  has been authorized for detection and/or diagnosis of SARS-CoV-2 by FDA under an Emergency Use Authorization (EUA). This EUA will remain  in effect (meaning this test can be used) for the duration of the COVID-19 declaration under Section 564(b)(1) of the Act, 21 U.S.C.section 360bbb-3(b)(1), unless the authorization is terminated  or revoked sooner.       Influenza A by PCR NEGATIVE NEGATIVE Final   Influenza B by PCR NEGATIVE NEGATIVE Final  Comment: (NOTE) The Xpert Xpress SARS-CoV-2/FLU/RSV plus assay is intended as an aid in the diagnosis of influenza from Nasopharyngeal swab specimens and should not be used as a sole basis for treatment. Nasal washings and aspirates are unacceptable for Xpert Xpress SARS-CoV-2/FLU/RSV testing.  Fact Sheet for Patients: EntrepreneurPulse.com.au  Fact Sheet for Healthcare Providers: IncredibleEmployment.be  This test is not yet approved or cleared by the [place] FDA and has been  authorized for detection and/or diagnosis of SARS-CoV-2 by FDA under an Emergency Use Authorization (EUA). This EUA will remain in effect (meaning this test can be used) for the duration of the COVID-19 declaration under Section 564(b)(1) of the Act, 21 U.S.C. section 360bbb-3(b)(1), unless the authorization is terminated or revoked.     Resp Syncytial Virus by PCR POSITIVE (A) NEGATIVE Final    Comment: (NOTE) Fact Sheet for Patients: EntrepreneurPulse.com.au  Fact Sheet for Healthcare Providers: IncredibleEmployment.be  This test is not yet approved or cleared by the [place] FDA and has been authorized for detection and/or diagnosis of SARS-CoV-2 by FDA under an Emergency Use Authorization (EUA). This EUA will remain in effect (meaning this test can be used) for the duration of the COVID-19 declaration under Section 564(b)(1) of the Act, 21 U.S.C. section 360bbb-3(b)(1), unless the authorization is terminated or revoked.  Performed at [facility], [place] [address] [place], [place] [address]          Radiology Studies: No results found.      Scheduled Meds:  amiodarone  200 mg Oral BID   Followed by   Derrill Memo ON 03/18/2022] amiodarone  200 mg Oral BID   apixaban  2.5 mg Oral BID   arformoterol  15 mcg Nebulization BID   aspirin  81 mg Oral Daily   budesonide (PULMICORT) nebulizer solution  0.25 mg Nebulization BID   guaiFENesin  1,200 mg Oral BID   lisinopril  20 mg Oral Daily   methylPREDNISolone (SOLU-MEDROL) injection  40 mg Intravenous Daily   metoprolol tartrate  25 mg Oral BID   pantoprazole  40 mg Oral BID AC   revefenacin  175 mcg Nebulization Daily   sodium chloride flush  3 mL Intravenous Q12H   Continuous Infusions:        Aline August, MD [facility] 03/17/2022, 9:25 AM

## 2022-03-18 DIAGNOSIS — J121 Respiratory syncytial virus pneumonia: Secondary | ICD-10-CM | POA: Diagnosis not present

## 2022-03-18 DIAGNOSIS — J9601 Acute respiratory failure with hypoxia: Secondary | ICD-10-CM | POA: Diagnosis not present

## 2022-03-18 LAB — CBC WITH DIFFERENTIAL/PLATELET
Abs Immature Granulocytes: 0.56 10*3/uL — ABNORMAL HIGH (ref 0.00–0.07)
Basophils Absolute: 0 10*3/uL (ref 0.0–0.1)
Basophils Relative: 0 %
Eosinophils Absolute: 0 10*3/uL (ref 0.0–0.5)
Eosinophils Relative: 0 %
HCT: 35.9 % — ABNORMAL LOW (ref 36.0–46.0)
Hemoglobin: 11.8 g/dL — ABNORMAL LOW (ref 12.0–15.0)
Immature Granulocytes: 3 %
Lymphocytes Relative: 7 %
Lymphs Abs: 1.3 10*3/uL (ref 0.7–4.0)
MCH: 28.4 pg (ref 26.0–34.0)
MCHC: 32.9 g/dL (ref 30.0–36.0)
MCV: 86.3 fL (ref 80.0–100.0)
Monocytes Absolute: 0.9 10*3/uL (ref 0.1–1.0)
Monocytes Relative: 5 %
Neutro Abs: 15.7 10*3/uL — ABNORMAL HIGH (ref 1.7–7.7)
Neutrophils Relative %: 85 %
Platelets: 250 10*3/uL (ref 150–400)
RBC: 4.16 MIL/uL (ref 3.87–5.11)
RDW: 16.2 % — ABNORMAL HIGH (ref 11.5–15.5)
WBC: 18.5 10*3/uL — ABNORMAL HIGH (ref 4.0–10.5)
nRBC: 0 % (ref 0.0–0.2)

## 2022-03-18 LAB — BASIC METABOLIC PANEL
Anion gap: 7 (ref 5–15)
BUN: 28 mg/dL — ABNORMAL HIGH (ref 8–23)
CO2: 26 mmol/L (ref 22–32)
Calcium: 8.3 mg/dL — ABNORMAL LOW (ref 8.9–10.3)
Chloride: 102 mmol/L (ref 98–111)
Creatinine, Ser: 0.97 mg/dL (ref 0.44–1.00)
GFR, Estimated: 57 mL/min — ABNORMAL LOW (ref >60)
Glucose, Bld: 93 mg/dL (ref 70–99)
Potassium: 4.6 mmol/L (ref 3.5–5.1)
Sodium: 135 mmol/L (ref 135–145)

## 2022-03-18 LAB — MAGNESIUM: Magnesium: 2.2 mg/dL (ref 1.7–2.4)

## 2022-03-18 MED ORDER — AMIODARONE HCL 200 MG PO TABS: 200.0000 mg | ORAL_TABLET | Freq: Every day | ORAL | 0 refills | Status: DC

## 2022-03-18 MED ORDER — GUAIFENESIN ER 600 MG PO TB12: 1200.0000 mg | ORAL_TABLET | Freq: Two times a day (BID) | ORAL | 0 refills | Status: DC

## 2022-03-18 MED ORDER — PREDNISONE 10 MG PO TABS: ORAL_TABLET | ORAL | 0 refills | Status: DC

## 2022-03-18 MED ORDER — LISINOPRIL 20 MG PO TABS: 20.0000 mg | ORAL_TABLET | Freq: Every day | ORAL | 0 refills | Status: DC

## 2022-03-18 NOTE — Progress Notes (Signed)
PROGRESS NOTE    [name]  WRU:[id number] DOB: 06-28-1934 DOA: 03/09/2022 PCP: [name], MD   Brief Narrative:  88 yo female with PMH A-fib, PSVT, pulmonary sarcoid, scoliosis, HLD, HTN, COPD, bronchiectasis, CAD, low back pain, anemia, GERD, diastolic CHF presented with shortness of breath. On workup in the ER she was found to be in A-fib with RVR and hypoxic initially requiring BiPAP which was able to be weaned down to 6 L. Viral swab testing also was positive for RSV. Cardiology was consulted and she was started on an amiodarone drip due to soft blood pressures which was subsequently transitioned to oral amiodarone.  PCCM was also consulted for respiratory status and patient was treated with IV steroids.  Assessment & Plan:   COPD exacerbation Acute respiratory failure with hypoxia History of bronchiectasis/pulmonary sarcoidosis RSV infection -Respiratory status has much improved but still on 1 L oxygen via nasal cannula.  Might need supplemental oxygen on discharge. -Currently on Solu-Medrol 40 mg IV daily.  PCCM recommended tapering steroids on discharge and signed off. -Continue current nebs  Paroxysmal A-fib with RVR History of PSVT -Currently rate mostly controlled.  Currently on Eliquis, oral amiodarone and metoprolol.  Cardiology has signed off.  Outpatient follow-up with cardiology -Echo showed EF of 60 to 65% with grade 2 diastolic dysfunction  Chronic diastolic CHF Compensated.  Continue beta-blocker, lisinopril.  Strict input and output.  Daily weights.  Fluid restriction.  Outpatient follow-up with cardiology  Leukocytosis -Probably reactive.  CAD Hyperlipidemia Hypertension -Blood pressure intermittently on the higher side.  Continue aspirin, beta-blocker, lisinopril and metoprolol  GERD -Continue PPI  Physical deconditioning -Patient is very deconditioned and thinks that patient might need SNF placement.  Will ask PT to reevaluate.   DVT  prophylaxis: Eliquis Code Status: DNR Family Communication: Daughter on phone  disposition Plan: Status is: Inpatient Remains inpatient appropriate because: Possible SNF need pending PT evaluation Consultants: PCCM/cardiology  Procedures: Echo  Antimicrobials: None   Subjective: Patient seen and examined at bedside.  Feels slightly better but still has intermittent cough.  Feels very weak.  Daughter states that patient might benefit from rehab.  No fever, vomiting, chest pain reported.   Objective: Vitals:   03/18/22 0800 03/18/22 0849 03/18/22 0851 03/18/22 0852  BP: (!) 117/49     Pulse: (!) 50     Resp: 15     Temp: 98 F (36.7 C)     TempSrc: Oral     SpO2: 93% 93% 99% 98%  Weight:      Height:        Intake/Output Summary (Last 24 hours) at 03/18/2022 1109 Last data filed at 03/18/2022 0842 Gross per 24 hour  Intake 3 ml  Output 500 ml  Net -497 ml    Filed Weights   03/16/22 0500 03/17/22 0500 03/18/22 0500  Weight: 56.8 kg 57.6 kg 58.6 kg    Examination:  General: No acute distress.  Still on the 1 L oxygen by nasal cannula.  Looks chronically ill and deconditioned respiratory: Bilateral decreased breath sounds at bases with scattered crackles and intermittent wheezing  CVS: Intermittently bradycardic; S1-S2 heard  abdominal: Soft, nontender, distended mildly; no organomegaly; normal bowel sounds heard  extremities: No cyanosis; mild lower extremity edema present    Data Reviewed: I have personally reviewed following labs and imaging studies  CBC: Recent Labs  Lab 03/14/22 0211 03/15/22 0705 03/16/22 0748 03/17/22 0502 03/18/22 0659  WBC 18.2* 19.6* 17.9* 17.8* 18.5*  NEUTROABS  16.6* 16.2* 14.2* 14.7* 15.7*  HGB 11.6* 11.9* 12.6 12.2 11.8*  HCT 36.8 36.3 38.5 37.5 35.9*  MCV 87.8 88.1 86.7 86.0 86.3  PLT 270 274 288 275 412    Basic Metabolic Panel: Recent Labs  Lab 03/14/22 0211 03/15/22 0705 03/16/22 0748 03/17/22 0502 03/18/22 0659   NA 135 134* 138 135 135  K 4.3 4.6 4.6 4.6 4.6  CL 105 103 104 103 102  CO2 '24 23 26 27 26  '$ GLUCOSE 147* 74 78 92 93  BUN 29* 24* 27* 25* 28*  CREATININE 0.91 0.79 0.85 0.79 0.97  CALCIUM 8.4* 8.3* 8.3* 8.4* 8.3*  MG 2.2 2.2 2.2 2.3 2.2    GFR: Estimated Creatinine Clearance: 35.1 mL/min (by C-G formula based on SCr of 0.97 mg/dL). Liver Function Tests: No results for input(s): "AST", "ALT", "ALKPHOS", "BILITOT", "PROT", "ALBUMIN" in the last 168 hours.  No results for input(s): "LIPASE", "AMYLASE" in the last 168 hours. No results for input(s): "AMMONIA" in the last 168 hours. Coagulation Profile: No results for input(s): "INR", "PROTIME" in the last 168 hours. Cardiac Enzymes: No results for input(s): "CKTOTAL", "CKMB", "CKMBINDEX", "TROPONINI" in the last 168 hours. BNP (last 3 results) No results for input(s): "PROBNP" in the last 8760 hours. HbA1C: No results for input(s): "HGBA1C" in the last 72 hours. CBG: No results for input(s): "GLUCAP" in the last 168 hours. Lipid Profile: No results for input(s): "CHOL", "HDL", "LDLCALC", "TRIG", "CHOLHDL", "LDLDIRECT" in the last 72 hours. Thyroid Function Tests: No results for input(s): "TSH", "T4TOTAL", "FREET4", "T3FREE", "THYROIDAB" in the last 72 hours. Anemia Panel: No results for input(s): "VITAMINB12", "FOLATE", "FERRITIN", "TIBC", "IRON", "RETICCTPCT" in the last 72 hours. Sepsis Labs: Recent Labs  Lab 03/14/22 0211 03/15/22 0705 03/16/22 0748  PROCALCITON <0.10 <0.10 <0.10     Recent Results (from the past 240 hour(s))  Resp panel by RT-PCR (RSV, Flu A&B, Covid) Anterior Nasal Swab     Status: Abnormal   Collection Time: 03/09/22  1:25 PM   Specimen: Anterior Nasal Swab  Result Value Ref Range Status   SARS Coronavirus 2 by RT PCR NEGATIVE NEGATIVE Final    Comment: (NOTE) SARS-CoV-2 target nucleic acids are NOT DETECTED.  The SARS-CoV-2 RNA is generally detectable in upper respiratory specimens during  the acute phase of infection. The lowest concentration of SARS-CoV-2 viral copies this assay can detect is 138 copies/mL. A negative result does not preclude SARS-Cov-2 infection and should not be used as the sole basis for treatment or other patient management decisions. A negative result may occur with  improper specimen collection/handling, submission of specimen other than nasopharyngeal swab, presence of viral mutation(s) within the areas targeted by this assay, and inadequate number of viral copies(<138 copies/mL). A negative result must be combined with clinical observations, patient history, and epidemiological information. The expected result is Negative.  Fact Sheet for Patients:  EntrepreneurPulse.com.au  Fact Sheet for Healthcare Providers:  IncredibleEmployment.be  This test is no t yet approved or cleared by the [place] FDA and  has been authorized for detection and/or diagnosis of SARS-CoV-2 by FDA under an Emergency Use Authorization (EUA). This EUA will remain  in effect (meaning this test can be used) for the duration of the COVID-19 declaration under Section 564(b)(1) of the Act, 21 U.S.C.section 360bbb-3(b)(1), unless the authorization is terminated  or revoked sooner.       Influenza A by PCR NEGATIVE NEGATIVE Final   Influenza B by PCR NEGATIVE NEGATIVE Final  Comment: (NOTE) The Xpert Xpress SARS-CoV-2/FLU/RSV plus assay is intended as an aid in the diagnosis of influenza from Nasopharyngeal swab specimens and should not be used as a sole basis for treatment. Nasal washings and aspirates are unacceptable for Xpert Xpress SARS-CoV-2/FLU/RSV testing.  Fact Sheet for Patients: EntrepreneurPulse.com.au  Fact Sheet for Healthcare Providers: IncredibleEmployment.be  This test is not yet approved or cleared by the [place] FDA and has been authorized for detection and/or  diagnosis of SARS-CoV-2 by FDA under an Emergency Use Authorization (EUA). This EUA will remain in effect (meaning this test can be used) for the duration of the COVID-19 declaration under Section 564(b)(1) of the Act, 21 U.S.C. section 360bbb-3(b)(1), unless the authorization is terminated or revoked.     Resp Syncytial Virus by PCR POSITIVE (A) NEGATIVE Final    Comment: (NOTE) Fact Sheet for Patients: EntrepreneurPulse.com.au  Fact Sheet for Healthcare Providers: IncredibleEmployment.be  This test is not yet approved or cleared by the [place] FDA and has been authorized for detection and/or diagnosis of SARS-CoV-2 by FDA under an Emergency Use Authorization (EUA). This EUA will remain in effect (meaning this test can be used) for the duration of the COVID-19 declaration under Section 564(b)(1) of the Act, 21 U.S.C. section 360bbb-3(b)(1), unless the authorization is terminated or revoked.  Performed at [facility], [place] [address], [place], [place] [address]          Radiology Studies: No results found.      Scheduled Meds:  amiodarone  200 mg Oral BID   apixaban  2.5 mg Oral BID   arformoterol  15 mcg Nebulization BID   aspirin  81 mg Oral Daily   budesonide (PULMICORT) nebulizer solution  0.25 mg Nebulization BID   guaiFENesin  1,200 mg Oral BID   lisinopril  20 mg Oral Daily   methylPREDNISolone (SOLU-MEDROL) injection  40 mg Intravenous Daily   metoprolol tartrate  25 mg Oral BID   pantoprazole  40 mg Oral BID AC   revefenacin  175 mcg Nebulization Daily   sodium chloride flush  3 mL Intravenous Q12H   Continuous Infusions:        Aline August, MD [facility] 03/18/2022, 11:09 AM

## 2022-03-18 NOTE — Progress Notes (Signed)
SATURATION QUALIFICATIONS: (This note is used to comply with regulatory documentation for home oxygen)  Patient Saturations on Room Air at Rest = 93%  Patient Saturations on Room Air while Ambulating = 82%  Patient Saturations on 2 Liters of oxygen while Ambulating = 95%

## 2022-03-18 NOTE — TOC Transition Note (Addendum)
Transition of Care [facility]) - CM/SW Discharge Note   Patient Details  Name: [name] MRN: [id number] Date of Birth: 1934-04-09  [facility] of [facility]) CM/SW Contact:  Benard Halsted, LCSW Phone Number: 03/18/2022, 3:15 PM   Clinical Narrative:    Patient will DC to: [facility]  Anticipated DC date: 03/18/22 Family notified: [name], daughter (daughter will being nebulizer and inhalers to SNF as requested) Transport by: [facility]  RN please send the face mask for nebulizer with patient.   Per MD patient ready for DC to [facility]. RN to call report prior to discharge [phone] room 609a). RN, patient, patient's family, and facility notified of DC. Discharge Summary and FL2 sent to facility. DC packet on chart including signed DNR. Ambulance transport requested for patient.   CSW will sign off for now as social work intervention is no longer needed. Please consult [place] again if new needs arise.     Final next level of care: Skilled Nursing Facility Barriers to Discharge: Barriers Resolved   Patient Goals and CMS Choice CMS Medicare.gov Compare Post Acute Care list provided to:: Patient Choice offered to / list presented to : Patient, Adult Children  Discharge Placement     Existing PASRR number confirmed : 03/18/22          Patient chooses bed at: [facility] Patient to be transferred to facility by: [facility] due to oxygen Name of family member notified: Daughter Patient and family notified of of transfer: 03/18/22  Discharge Plan and Services Additional resources added to the After Visit Summary for   In-house Referral: Clinical Social Work Discharge Planning Services: CM Consult Post Acute Care Choice: [place]          DME Arranged: Oxygen DME Agency: [employer] Date DME Agency Contacted: 03/15/22 Time DME Agency Contacted: 1517 Representative spoke with at DME Agency: [facility] HH Arranged: PT [place]: [place] Date [place]: 03/15/22 Time [place]: 1137 Representative spoke with at [place]: [place] ([place]) Interventions SDOH Screenings   Tobacco Use: Low Risk  (03/10/2022)     Readmission Risk Interventions    09/26/2019   10:56 AM 08/01/2019    4:14 PM  Readmission Risk Prevention Plan  Transportation Screening Complete Complete  PCP or Specialist Appt within 5-7 Days  Complete  PCP or Specialist Appt within 3-5 Days Complete   Home Care Screening  Complete  Medication Review (RN CM)  Referral to Pharmacy  [place]RI or [place] Complete   Social Work Consult for [place] Planning/Counseling Complete   Palliative Care Screening Not Applicable   Medication Review Press photographer) Complete

## 2022-03-18 NOTE — Progress Notes (Signed)
Physical Therapy Treatment Patient Details Name: [name] MRN: [id number] DOB: 07-13-1934 Today's Date: 03/18/2022   History of Present Illness Patient is a 88 y/o female who presents on 1/17 with SOB. Found to have acute respiratory failure secondary to RSV infection. PMH includes NSTEMI, CAD, HTN, COPD, sarcoid bronchiestasis, A-fib, CHF.    PT Comments    Patient remains limited with activity tolerance though working hard to increased distance walking in room today.  She will continue to benefit from skilled PT with higher intensity at [facility] rather than at home.  Family and pt in agreement.  Will follow up if not d/c.   Recommendations for follow up therapy are one component of a multi-disciplinary discharge planning process, led by the attending physician.  Recommendations may be updated based on patient status, additional functional criteria and insurance authorization.  Follow Up Recommendations  Skilled nursing-short term rehab (<3 hours/day)     Assistance Recommended at Discharge Intermittent Supervision/Assistance  Patient can return home with the following A little help with walking and/or transfers;A little help with bathing/dressing/bathroom;Assist for transportation;Assistance with cooking/housework;Direct supervision/assist for medications management;Direct supervision/assist for financial management   Equipment Recommendations  None recommended by PT    Recommendations for Other Services       Precautions / Restrictions Precautions Precautions: Fall Precaution Comments: watch 02     Mobility  Bed Mobility           Sit to supine: Supervision   General bed mobility comments: increased time, no assist    Transfers Overall transfer level: Needs assistance Equipment used: Rolling walker (2 wheels) Transfers: Sit to/from Stand Sit to Stand: Mod assist           General transfer comment: some lifting help to stand, assisted from recliner  initially then assist and cues for slower lowering to sit; perofmred STS x 3 reps from EOB at elevated height with minguard assist    Ambulation/Gait Ambulation/Gait assistance: Min guard Gait Distance (Feet): 30 Feet (& 10') Assistive device: Rolling walker (2 wheels) Gait Pattern/deviations: Step-through pattern, Decreased stride length, Trunk flexed       General Gait Details: slower pace, limite distance due to fatigue, to door of room then to sit in straight back chair beside sink to rest prior to return to EOB   Stairs             Wheelchair Mobility    Modified Rankin (Stroke Patients Only)       Balance Overall balance assessment: Needs assistance Sitting-balance support: Feet supported Sitting balance-Leahy Scale: Fair     Standing balance support: During functional activity Standing balance-Leahy Scale: Poor                 High Level Balance Comments: assist for balance standing with sit <> stand x 6 reps            Cognition Arousal/Alertness: Awake/alert Behavior During Therapy: WFL for tasks assessed/performed Overall Cognitive Status: Within Functional Limits for tasks assessed                       Memory: Decreased short-term memory         General Comments: WFL for basic mobility tasks        Exercises Other Exercises Other Exercises: sit<>stand x 3 reps with rest breaks in between, slightly higher surface and min A    General Comments General comments (skin integrity, edema, etc.): on 2lL o2 with ambulation  SpO2 down to 87% x 1 and recovered with standing rest withcues for pursed lip breathing      Pertinent Vitals/Pain Pain Assessment Faces Pain Scale: No hurt    Home Living                          Prior Function            PT Goals (current goals can now be found in the care plan section) Progress towards PT goals: Progressing toward goals    Frequency    Min 3X/week      PT Plan  Discharge plan needs to be updated    Co-evaluation              AM-PAC PT "6 Clicks" Mobility   Outcome Measure  Help needed turning from your back to your side while in a flat bed without using bedrails?: A Little Help needed moving from lying on your back to sitting on the side of a flat bed without using bedrails?: A Little Help needed moving to and from a bed to a chair (including a wheelchair)?: A Little Help needed standing up from a chair using your arms (e.g., wheelchair or bedside chair)?: A Little   Help needed climbing 3-5 steps with a railing? : A Little 6 Click Score: 15    End of Session Equipment Utilized During Treatment: Gait belt;Oxygen Activity Tolerance: Patient limited by fatigue Patient left: in bed;with call bell/phone within reach;with family/visitor present   PT Visit Diagnosis: Muscle weakness (generalized) (M62.81);Difficulty in walking, not elsewhere classified (R26.2)     Time: 1779-3903 PT Time Calculation (min) (ACUTE ONLY): 33 min  Charges:  $Gait Training: 8-22 mins $Therapeutic Activity: 8-22 mins                     Magda Kiel, PT Acute Rehabilitation Services Office:[phone] 03/18/2022    [name] 03/18/2022, 3:28 PM

## 2022-03-18 NOTE — Care Management Important Message (Signed)
Important Message  Patient Details  Name: [name] MRN: [id number] Date of Birth: Dec 31, 1934   Medicare Important Message Given:  Yes     Dorthula Bier 03/18/2022, 3:22 PM

## 2022-03-18 NOTE — NC FL2 (Signed)
[place] LEVEL OF CARE FORM     IDENTIFICATION  Patient Name: [name] Birthdate: 1934-08-12 Sex: female Admission Date (Current Location): 03/09/2022  [facility] and [place] Number:  Herbalist and Address:  The . [facility], [place] [address], [place], [place] [address]      Provider Number: [id number]  Attending Physician Name and Address:  Aline August, MD  Relative Name and Phone Number:       Current Level of Care: Hospital Recommended Level of Care: [place] Prior Approval Number:    Date Approved/Denied:   PASRR Number: [id number]  Discharge Plan: SNF    Current Diagnoses: Patient Active Problem List   Diagnosis Date Noted   RSV (respiratory syncytial virus pneumonia) 03/11/2022   COPD exacerbation ([place]) 03/09/2022   Paroxysmal atrial fibrillation with RVR ([place]) 03/09/2022   Acute respiratory failure with hypoxia ([place]) 03/09/2022   Bronchiectasis with acute exacerbation ([place]) 03/04/2022   URI (upper respiratory infection) 03/04/2022   Elevated troponin    Anemia 08/08/2021   Bladder spasm 08/08/2021   Chest pain 08/08/2021   Chest pain, rule out acute myocardial infarction 08/07/2021   Chronic heart failure with preserved ejection fraction ([place]) 02/03/2021   Cognitive decline 01/18/2021   Right knee pain 06/08/2020   Age-related osteoporosis without current pathological fracture 05/01/2020   Nasal congestion 05/01/2020   Personal history of COVID-19 05/01/2020   Bilateral hearing loss 11/15/2019   Post-nasal drainage 11/15/2019   COPD (chronic obstructive pulmonary disease) ([place]) 10/30/2019   Atrial fibrillation (HCC)    Adhesive capsulitis of right shoulder 28/36/6294   Metabolic encephalopathy 76/54/6503   Leukocytosis 07/18/2019   S/P reverse total shoulder arthroplasty, 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   Rotator cuff arthropathy of left shoulder 05/03/2019   Shoulder pain, left 04/29/2019   Low back  pain 03/13/2019   Degeneration of lumbar intervertebral disc 02/14/2019   Amnesia 02/06/2018   ETD (Eustachian tube dysfunction), bilateral 11/07/2017   Chronic nonintractable headache 11/07/2017   Otalgia 10/17/2017   Pain in joint of right elbow 05/02/2017   Rib lesion 04/20/2017   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   Atopic dermatitis 04/08/2016   Skin sensation disturbance 03/08/2016   Weakness 12/22/2015   Allergic rhinitis 09/15/2015   CAD (coronary artery disease) 05/18/2015   Essential hypertension 05/18/2015   Carotid artery disease ([place]) 05/18/2015   Cough 03/19/2015   Shortness of breath 12/26/2013   Abnormal gait 11/06/2013   Scoliosis (and kyphoscoliosis), idiopathic 07/30/2013   Acquired unequal leg length on left 04/11/2013   History of fracture 10/18/2012   Palpitations 10/18/2012   Constipation 09/05/2012   History of sinus tachycardia 09/05/2012   Spasm of back muscles 03/12/2012   Lumbar radiculopathy 11/23/2011   Mitral regurgitation 11/22/2011   Low compliance bladder 04/18/2011   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   Carpal tunnel syndrome 02/23/2009   Cardiovascular symptoms 02/23/2009   Vitamin D deficiency 02/23/2009   RECTAL BLEEDING 08/18/2008   PERSONAL HX COLONIC POLYPS 08/18/2008   SKIN CANCER, HX OF 08/14/2008   CARPAL TUNNEL SYNDROME, HX OF 08/14/2008   SARCOIDOSIS, PULMONARY 08/08/2008   HLD (hyperlipidemia) 08/08/2008   Paroxysmal supraventricular tachycardia ([place]) 08/08/2008   GERD (gastroesophageal reflux disease) 08/08/2008    Orientation RESPIRATION BLADDER Height & Weight     Self, Time, Situation, Place  O2 (1L nasal cannula) Incontinent, External catheter Weight: 129 lb 3 oz (58.6 kg) Height:  '5\' 3"'$  (160 cm)  BEHAVIORAL SYMPTOMS/MOOD NEUROLOGICAL BOWEL NUTRITION STATUS      Continent Diet (See dc summary)  AMBULATORY STATUS COMMUNICATION OF NEEDS Skin   Limited Assist Verbally Normal                        Personal Care Assistance Level of Assistance  Bathing, Feeding, Dressing Bathing Assistance: Limited assistance Feeding assistance: Independent Dressing Assistance: Limited assistance     Functional Limitations Info             SPECIAL CARE FACTORS FREQUENCY  PT (By licensed PT), OT (By licensed OT)     PT Frequency: eval and treat OT Frequency: eval and treat            Contractures Contractures Info: Not present    Additional Factors Info  Isolation Precautions Code Status Info: DNR Allergies Info: Levaquin (Levofloxacin), Oxycodone, Sulfonamide Derivatives, Sulfa Antibiotics, Morphine     Isolation Precautions Info: RSV+ on 03/09/22     Current Medications (03/18/2022):  This is the current hospital active medication list Current Facility-Administered Medications  Medication Dose Route Frequency Provider Last Rate Last Admin   acetaminophen (TYLENOL) tablet 650 mg  650 mg Oral Q6H PRN Marcelyn Bruins, MD   650 mg at 03/16/22 2128   Or   acetaminophen (TYLENOL) suppository 650 mg  650 mg Rectal Q6H PRN Marcelyn Bruins, MD       alum & mag hydroxide-simeth (MAALOX/MYLANTA) 200-200-20 MG/5ML suspension 30 mL  30 mL Oral Q4H PRN Dwyane Dee, MD   30 mL at 03/17/22 1844   amiodarone (PACERONE) tablet 200 mg  200 mg Oral BID Geralynn Rile, MD   200 mg at 03/18/22 2130   apixaban (ELIQUIS) tablet 2.5 mg  2.5 mg Oral BID Marcelyn Bruins, MD   2.5 mg at 03/18/22 8657   arformoterol (BROVANA) nebulizer solution 15 mcg  15 mcg Nebulization BID Hunsucker, Bonna Gains, MD   15 mcg at 03/18/22 8469   aspirin chewable tablet 81 mg  81 mg Oral Daily Marcelyn Bruins, MD   81 mg at 03/18/22 6295   budesonide (PULMICORT) nebulizer solution 0.25 mg  0.25 mg Nebulization BID Hunsucker, Bonna Gains, MD   0.25 mg at 03/18/22 2841   guaiFENesin (MUCINEX) 12 hr tablet 1,200 mg  1,200 mg Oral BID Dwyane Dee, MD   1,200 mg at 03/18/22 3244   HYDROcodone  bit-homatropine (HYCODAN) 5-1.5 MG/5ML syrup 5 mL  5 mL Oral Q6H PRN Hunsucker, Bonna Gains, MD   5 mL at 03/17/22 2139   ipratropium-albuterol (DUONEB) 0.5-2.5 (3) MG/3ML nebulizer solution 3 mL  3 mL Nebulization Q6H PRN Hunsucker, Bonna Gains, MD       lisinopril (ZESTRIL) tablet 20 mg  20 mg Oral Daily Dwyane Dee, MD   20 mg at 03/17/22 0102   methylPREDNISolone sodium succinate (SOLU-MEDROL) 40 mg/mL injection 40 mg  40 mg Intravenous Daily Hunsucker, Bonna Gains, MD   40 mg at 03/18/22 7253   metoprolol tartrate (LOPRESSOR) tablet 25 mg  25 mg Oral BID Geralynn Rile, MD   25 mg at 03/17/22 6644   Oral care mouth rinse  15 mL Mouth Rinse PRN Dwyane Dee, MD       pantoprazole (PROTONIX) EC tablet 40 mg  40 mg Oral BID AC Alekh, Kshitiz, MD   40 mg at 03/18/22 0821   polyethylene glycol (MIRALAX / GLYCOLAX) packet 17 g  17 g Oral Daily PRN Trilby Drummer,  Candace Gallus, MD       revefenacin [facility]) nebulizer solution 175 mcg  175 mcg Nebulization Daily Hunsucker, Bonna Gains, MD   175 mcg at 03/18/22 0851   sodium chloride flush (NS) 0.9 % injection 3 mL  3 mL Intravenous Q12H Marcelyn Bruins, MD   3 mL at 03/18/22 7096     Discharge Medications: Please see discharge summary for a list of discharge medications.  Relevant Imaging Results:  Relevant Lab Results:   Additional Information SS# [id number]  Benard Halsted, LCSW

## 2022-03-18 NOTE — TOC Progression Note (Addendum)
Transition of Care [facility]) - Progression Note    Patient Details  Name: [name] MRN: [id number] Date of Birth: 1934-03-13  Transition of Care [facility]) CM/SW [place], Clarinda Phone Number: 03/18/2022, 11:22 AM  Clinical Narrative:    11:22am-CSW spoke with patient's daughter regarding her request for SNF placement. She stated she is concerned about patient's ability to transfer in the home. CSW provided SNF bed offers. Daughter will call CSW back with choice, considering [facility] and [facility].   1pm-Daughter and patient have accepted semi-private room at [facility]. Will need to arrange PTAR for transport since patient is on oxygen.    Expected Discharge Plan: Skilled Nursing Facility Barriers to Discharge: SNF Pending bed offer  Expected Discharge Plan and Services In-house Referral: Clinical Social Work Discharge Planning Services: CM Consult Post Acute Care Choice: [place] Living arrangements for the past 2 months: Single Family Home                 DME Arranged: Oxygen DME Agency: [employer] Date DME Agency Contacted: 03/15/22 Time DME Agency Contacted: [id number] Representative spoke with at DME Agency: Brenton Grills HH Arranged: PT [place]: [place] Date [place]: 03/15/22 Time [place]: 1137 Representative spoke with at [place]: [place]) Interventions SDOH Screenings   Tobacco Use: Low Risk  (03/10/2022)    Readmission Risk Interventions    09/26/2019   10:56 AM 08/01/2019    4:14 PM  Readmission Risk Prevention Plan  Transportation Screening Complete Complete  PCP or Specialist Appt within 5-7 Days  Complete  PCP or Specialist Appt within 3-5 Days Complete   Home Care Screening  Complete  Medication Review (RN CM)  Referral to Pharmacy  HRI or [place] Complete   Social Work Consult for [place] Planning/Counseling Complete    Palliative Care Screening Not Applicable   Medication Review Press photographer) Complete

## 2022-03-18 NOTE — Discharge Summary (Signed)
Physician Discharge Summary  [name] GNO:[id number] DOB: 1934-09-06 DOA: 03/09/2022  PCP: [name], MD  Admit date: 03/09/2022 Discharge date: 03/18/2022  Admitted From: Home Disposition: SNF  Recommendations for Outpatient Follow-up:  Follow up with SNF provider at earliest convenience outpatient follow-up with pulmonary and cardiology Recommend outpatient evaluation and follow-up with palliative care if condition were to worsen Follow up in ED if symptoms worsen or new appear   Home Health: No Equipment/Devices: None  Discharge Condition: Guarded CODE STATUS: DNR Diet recommendation: Heart healthy/fluid restriction of up to 1500 cc a day  Brief/Interim Summary: 88 yo female with PMH A-fib, PSVT, pulmonary sarcoid, scoliosis, HLD, HTN, COPD, bronchiectasis, CAD, low back pain, anemia, GERD, diastolic CHF presented with shortness of breath. On workup in the ER she was found to be in A-fib with RVR and hypoxic initially requiring BiPAP which was able to be weaned down to 6 L. Viral swab testing also was positive for RSV. Cardiology was consulted and she was started on an amiodarone drip due to soft blood pressures which was subsequently transitioned to oral amiodarone.  PCCM was also consulted for respiratory status and patient was treated with IV steroids.  Overall respite status is improving.  Patient is very deconditioned physical.  She will be discharged to SNF once bed is available.  Discharge Diagnoses:   COPD exacerbation Acute respiratory failure with hypoxia History of bronchiectasis/pulmonary sarcoidosis RSV infection -Respiratory status has much improved but still on 1-2 L oxygen via nasal cannula.  Might need supplemental oxygen on discharge. -Currently on Solu-Medrol 40 mg IV daily.  PCCM recommended tapering steroids on discharge and signed off.  Will discharge on prednisone 20 mg daily for 5 days and tenderness daily for 5 days as per pulm  recommendations. -Continue current inhaled regimen/nebs -Discharge to SNF once bed is available.   Paroxysmal A-fib with RVR History of PSVT -Currently rate mostly controlled.  Currently on Eliquis, oral amiodarone and metoprolol.  Cardiology has signed off.  Outpatient follow-up with cardiology -Echo showed EF of 60 to 65% with grade 2 diastolic dysfunction -Will discharge on amiodarone 200 mg daily for 21 days as per cardiology recommendations   Chronic diastolic CHF -Compensated.  Continue beta-blocker, lisinopril.  Continue fluid and diet restriction.  Outpatient follow-up with cardiology   Leukocytosis -Probably reactive.   CAD Hyperlipidemia Hypertension -Blood pressure intermittently on the higher side.  Continue aspirin, beta-blocker, lisinopril and metoprolol   GERD -Continue PPI   Physical deconditioning -Patient is very deconditioned and thinks that patient might need SNF placement.    Discharge Instructions  Discharge Instructions     Diet - low sodium heart healthy   Complete by: As directed    Increase activity slowly   Complete by: As directed       Allergies as of 03/18/2022       Reactions   Levaquin [levofloxacin] Other (See Comments)   Joint pain.. Per doctor not to take again   Oxycodone Nausea Only   Can take oxycodone with Zofran Other reaction(s): vomiting   Sulfonamide Derivatives Other (See Comments)   Possibly caused hepatitis   Sulfa Antibiotics Other (See Comments)   Possibly caused hepatitis   Morphine Nausea Only        Medication List     STOP taking these medications    diltiazem 120 MG 24 hr capsule Commonly known as: CARDIZEM CD   doxycycline 100 MG tablet Commonly known as: VIBRA-TABS   Macrobid 100 MG capsule  Generic drug: nitrofurantoin (macrocrystal-monohydrate)   rosuvastatin 10 MG tablet Commonly known as: CRESTOR   traMADol 50 MG tablet Commonly known as: ULTRAM       TAKE these medications     acetaminophen 500 MG tablet Commonly known as: TYLENOL Take 1,000 mg by mouth every 6 (six) hours as needed for mild pain.   amiodarone 200 MG tablet Commonly known as: PACERONE Take 1 tablet (200 mg total) by mouth daily for 21 days. Start taking on: March 19, 2022   aspirin 81 MG chewable tablet Chew 81 mg by mouth daily.   Breztri Aerosphere 160-9-4.8 MCG/ACT Aero Generic drug: Budeson-Glycopyrrol-Formoterol Inhale 2 puffs into the lungs in the morning and at bedtime.   denosumab 60 MG/ML Soln injection Commonly known as: PROLIA Inject 60 mg into the skin every 6 (six) months. Administer in upper arm, thigh, or abdomen   Eliquis 2.5 MG Tabs tablet Generic drug: apixaban TAKE 1 TABLET(2.5 MG) BY MOUTH TWICE DAILY What changed: See the new instructions.   Gemtesa 75 MG Tabs Generic drug: Vibegron Take 75 mg by mouth at bedtime.   guaiFENesin 600 MG 12 hr tablet Commonly known as: MUCINEX Take 2 tablets (1,200 mg total) by mouth 2 (two) times daily.   ipratropium-albuterol 0.5-2.5 (3) MG/3ML Soln Commonly known as: DUONEB Take 3 mLs by nebulization every 6 (six) hours as needed (wheezing; shortness of breath).   lisinopril 20 MG tablet Commonly known as: ZESTRIL Take 1 tablet (20 mg total) by mouth daily. Start taking on: March 19, 2022   metoprolol tartrate 25 MG tablet Commonly known as: LOPRESSOR Take 1 tablet (25 mg total) by mouth daily as needed (palpitations).   nitroGLYCERIN 0.4 MG SL tablet Commonly known as: NITROSTAT Place 1 tablet (0.4 mg total) under the tongue every 5 (five) minutes as needed for chest pain.   omeprazole 40 MG capsule Commonly known as: PRILOSEC Take 40 mg by mouth 2 (two) times daily.   oxybutynin 5 MG tablet Commonly known as: DITROPAN Take 5 mg by mouth at bedtime.   predniSONE 10 MG tablet Commonly known as: DELTASONE '20mg'$  daily for 5 days then '10mg'$  daily for 5 days then stop Start taking on: March 19, 2022 What changed: additional instructions   sertraline 50 MG tablet Commonly known as: ZOLOFT Take 1 tablet (50 mg total) by mouth at bedtime.   TUMS ULTRA 1000 PO Take 2,000-3,000 mg by mouth at bedtime as needed (acid reflux/indigestion/heartburn).   Ventolin HFA 108 (90 Base) MCG/ACT inhaler Generic drug: albuterol INHALE 2 PUFFS INTO THE LUNGS EVERY 4 HOURS AS NEEDED FOR WHEEZING OR SHORTNESS OF BREATH               Durable Medical Equipment  (From admission, onward)           Start     Ordered   03/12/22 0904  For home use only DME oxygen  Once       Question Answer Comment  Length of Need 6 Months   Mode or (Route) Nasal cannula   Liters per Minute 2   Frequency Continuous (stationary and portable oxygen unit needed)   Oxygen conserving device Yes   Oxygen delivery system Gas      03/12/22 0903            Follow-up Information     [name] T, PA-C Follow up.   Specialties: Cardiology, Physician Assistant Why: [place] location - keep cardiology follow-up  on Wednesday Mar 23, 2022 at 3:35 PM (Arrive by 3:20 PM). Contact information: [address] [place] [address] [phone]         [facility] Follow up.   Why: [facility] will be providing your Home Oxygen and portable tank to return home Contact information: [address]  [place], [place] [address]  [phone]        [facility] Follow up.   Specialty: Home Health Services Why: [facility] will be providing your Home Health PhysicalTherapy  They should contact you within 1-3 days of DC . Contact information: [address] [place] [address] [phone]                Allergies  Allergen Reactions   Levaquin [Levofloxacin] Other (See Comments)    Joint pain.. Per doctor not to take again   Oxycodone Nausea Only    Can take oxycodone with Zofran Other reaction(s): vomiting   Sulfonamide  Derivatives Other (See Comments)    Possibly caused hepatitis   Sulfa Antibiotics Other (See Comments)    Possibly caused hepatitis   Morphine Nausea Only    Consultations: PCCM/cardiology     Procedures/Studies: DG CHEST PORT 1 VIEW  Result Date: 03/14/2022 CLINICAL DATA:  RSV infection EXAM: PORTABLE CHEST 1 VIEW COMPARISON:  03/09/2022 FINDINGS: Artifact overlies the chest. Heart size is normal. Chronic aortic atherosclerosis. Chronic pulmonary scarring, more severe affecting the right lung than the left. No evidence of consolidation, collapse or effusion. No acute bone finding. Previous shoulder replacement on the left. IMPRESSION: Chronic pulmonary scarring, more severe on the right than the left. No acute finding. Electronically Signed   By: Nelson Chimes M.D.   On: 03/14/2022 09:53   ECHOCARDIOGRAM COMPLETE  Result Date: 03/10/2022    ECHOCARDIOGRAM REPORT   Patient Name:   Sherri VIGEANT Date of Exam: 03/10/2022 Medical Rec #:  [id number]        Height:       63.0 in Accession #:    [id number]       Weight:       127.4 lb Date of Birth:  April 28, 1934        BSA:          1.596 m Patient Age:    27 years         BP:           139/59 mmHg Patient Gender: F                HR:           72 bpm. Exam Location:  Inpatient Procedure: 2D Echo, Color Doppler and Cardiac Doppler Indications:    Atrial Fibrillation I48.91  History:        Patient has prior history of Echocardiogram examinations, most                 recent 08/08/2021. CAD, COPD, Arrythmias:Tachycardia and Atrial                 Fibrillation, Signs/Symptoms:Dyspnea; Risk Factors:Hypertension                 and Dyslipidemia.  Sonographer:    Ronny Flurry Referring Phys: [id number] [place]  1. Left ventricular ejection fraction, by estimation, is 60 to 65%. The left ventricle has normal function. The left ventricle has no regional wall motion abnormalities. There is mild concentric left ventricular hypertrophy. Left  ventricular  diastolic parameters are consistent with Grade II diastolic dysfunction (pseudonormalization).  2. Right ventricular systolic function is normal. The right ventricular size is normal. There is mildly elevated pulmonary artery systolic pressure.  3. Trivial mitral valve regurgitation.  4. The aortic valve is grossly normal. Aortic valve regurgitation is trivial.  5. The inferior vena cava is normal in size with greater than 50% respiratory variability, suggesting right atrial pressure of 3 mmHg. Comparison(s): No significant change from prior study. FINDINGS  Left Ventricle: Left ventricular ejection fraction, by estimation, is 60 to 65%. The left ventricle has normal function. The left ventricle has no regional wall motion abnormalities. The left ventricular internal cavity size was normal in size. There is  mild concentric left ventricular hypertrophy. Left ventricular diastolic parameters are consistent with Grade II diastolic dysfunction (pseudonormalization). Right Ventricle: The right ventricular size is normal. Right ventricular systolic function is normal. There is mildly elevated pulmonary artery systolic pressure. The tricuspid regurgitant velocity is 2.88 m/s, and with an assumed right atrial pressure of 3 mmHg, the estimated right ventricular systolic pressure is 99.3 mmHg. Left Atrium: Left atrial size was normal in size. Right Atrium: Right atrial size was normal in size. Pericardium: There is no evidence of pericardial effusion. Mitral Valve: Trivial mitral valve regurgitation. Tricuspid Valve: Tricuspid valve regurgitation is mild. Aortic Valve: The aortic valve is grossly normal. Aortic valve regurgitation is trivial. Aortic valve mean gradient measures 4.0 mmHg. Aortic valve peak gradient measures 7.3 mmHg. Aortic valve area, by VTI measures 2.17 cm. Pulmonic Valve: Pulmonic valve regurgitation is not visualized. Aorta: The aortic root and ascending aorta are structurally normal, with  no evidence of dilitation. Venous: The inferior vena cava is normal in size with greater than 50% respiratory variability, suggesting right atrial pressure of 3 mmHg. IAS/Shunts: The interatrial septum was not well visualized.  LEFT VENTRICLE PLAX 2D LVOT diam:     2.00 cm   Diastology LV SV:         65        LV e' medial:    6.85 cm/s LV SV Index:   41        LV E/e' medial:  13.5 LVOT Area:     3.14 cm  LV e' lateral:   7.72 cm/s                          LV E/e' lateral: 12.0                           3D Volume EF:                          3D EF:        62 %                          LV EDV:       136 ml                          LV ESV:       52 ml                          LV SV:        84 ml RIGHT VENTRICLE RV Basal diam:  3.90 cm  RV Mid diam:    2.10 cm RV S prime:     20.00 cm/s TAPSE (M-mode): 2.3 cm LEFT ATRIUM             Index        RIGHT ATRIUM           Index LA Vol (A2C):   32.7 ml 20.48 ml/m  RA Area:     12.80 cm LA Vol (A4C):   49.5 ml 31.01 ml/m  RA Volume:   30.00 ml  18.79 ml/m LA Biplane Vol: 43.2 ml 27.06 ml/m  AORTIC VALVE AV Area (Vmax):    2.36 cm AV Area (Vmean):   2.19 cm AV Area (VTI):     2.17 cm AV Vmax:           135.00 cm/s AV Vmean:          92.300 cm/s AV VTI:            0.301 m AV Peak Grad:      7.3 mmHg AV Mean Grad:      4.0 mmHg LVOT Vmax:         101.23 cm/s LVOT Vmean:        64.467 cm/s LVOT VTI:          0.208 m LVOT/AV VTI ratio: 0.69  AORTA Ao Root diam: 2.70 cm Ao Asc diam:  2.20 cm MITRAL VALVE               TRICUSPID VALVE MV Area (PHT): 3.85 cm    TR Peak grad:   33.2 mmHg MV Decel Time: 197 msec    TR Vmax:        288.00 cm/s MV E velocity: 92.70 cm/s MV A velocity: 88.60 cm/s  SHUNTS MV E/A ratio:  1.05        Systemic VTI:  0.21 m                            Systemic Diam: 2.00 cm Phineas Inches Electronically signed by Phineas Inches Signature Date/Time: 03/10/2022/10:38:36 AM    Final    DG Chest Port 1 View  Result Date: 03/09/2022 CLINICAL DATA:  Short of  breath EXAM: PORTABLE CHEST 1 VIEW COMPARISON:  None Available. FINDINGS: Lungs are hyperinflated. Chronic bronchitic markings. No effusion, infiltrate or pneumothorax. LEFT shoulder arthroplasty. IMPRESSION: Hyperinflated lungs and chronic bronchitic change. No acute findings. Electronically Signed   By: Suzy Bouchard M.D.   On: 03/09/2022 14:23   DG Chest 2 View  Result Date: 03/04/2022 CLINICAL DATA:  Cough EXAM: CHEST - 2 VIEW COMPARISON:  CXR 10/07/21 FINDINGS: Small right pleural effusion, new from prior exam. Flattening of the bilateral hemidiaphragms. Pulmonary hyperinflation. Unchanged cardiac and mediastinal contours. No focal airspace opacity. Chronic multiple contiguous left-sided rib fractures. No pneumothorax. Left shoulder arthroplasty. Visualized upper abdomen is unremarkable. Vertebral body heights are maintained. IMPRESSION: 1. Small right pleural effusion, new from prior exam. 2. Pulmonary hyperinflation, suggesting underlying obstructive lung disease. Electronically Signed   By: Marin Roberts M.D.   On: 03/04/2022 16:37      Subjective: Patient seen and examined at bedside.  Feels slightly better but still has intermittent cough.  Feels very weak.  Daughter states that patient might benefit from rehab.  No fever, vomiting, chest pain reported.     Discharge Exam: Vitals:   03/18/22 0851 03/18/22 0852  BP:    Pulse:  Resp:    Temp:    SpO2: 99% 98%    General: No acute distress.  Still on the 1 L oxygen by nasal cannula.  Looks chronically ill and deconditioned respiratory: Bilateral decreased breath sounds at bases with scattered crackles and intermittent wheezing  CVS: Intermittently bradycardic; S1-S2 heard  abdominal: Soft, nontender, distended mildly; no organomegaly; normal bowel sounds heard  extremities: No cyanosis; mild lower extremity edema present    The results of significant diagnostics from this hospitalization (including imaging, microbiology,  ancillary and laboratory) are listed below for reference.     Microbiology: Recent Results (from the past 240 hour(s))  Resp panel by RT-PCR (RSV, Flu A&B, Covid) Anterior Nasal Swab     Status: Abnormal   Collection Time: 03/09/22  1:25 PM   Specimen: Anterior Nasal Swab  Result Value Ref Range Status   SARS Coronavirus 2 by RT PCR NEGATIVE NEGATIVE Final    Comment: (NOTE) SARS-CoV-2 target nucleic acids are NOT DETECTED.  The SARS-CoV-2 RNA is generally detectable in upper respiratory specimens during the acute phase of infection. The lowest concentration of SARS-CoV-2 viral copies this assay can detect is 138 copies/mL. A negative result does not preclude SARS-Cov-2 infection and should not be used as the sole basis for treatment or other patient management decisions. A negative result may occur with  improper specimen collection/handling, submission of specimen other than nasopharyngeal swab, presence of viral mutation(s) within the areas targeted by this assay, and inadequate number of viral copies(<138 copies/mL). A negative result must be combined with clinical observations, patient history, and epidemiological information. The expected result is Negative.  Fact Sheet for Patients:  EntrepreneurPulse.com.au  Fact Sheet for Healthcare Providers:  IncredibleEmployment.be  This test is no t yet approved or cleared by the [place] FDA and  has been authorized for detection and/or diagnosis of SARS-CoV-2 by FDA under an Emergency Use Authorization (EUA). This EUA will remain  in effect (meaning this test can be used) for the duration of the COVID-19 declaration under Section 564(b)(1) of the Act, 21 U.S.C.section 360bbb-3(b)(1), unless the authorization is terminated  or revoked sooner.       Influenza A by PCR NEGATIVE NEGATIVE Final   Influenza B by PCR NEGATIVE NEGATIVE Final    Comment: (NOTE) The Xpert Xpress  SARS-CoV-2/FLU/RSV plus assay is intended as an aid in the diagnosis of influenza from Nasopharyngeal swab specimens and should not be used as a sole basis for treatment. Nasal washings and aspirates are unacceptable for Xpert Xpress SARS-CoV-2/FLU/RSV testing.  Fact Sheet for Patients: EntrepreneurPulse.com.au  Fact Sheet for Healthcare Providers: IncredibleEmployment.be  This test is not yet approved or cleared by the [place] FDA and has been authorized for detection and/or diagnosis of SARS-CoV-2 by FDA under an Emergency Use Authorization (EUA). This EUA will remain in effect (meaning this test can be used) for the duration of the COVID-19 declaration under Section 564(b)(1) of the Act, 21 U.S.C. section 360bbb-3(b)(1), unless the authorization is terminated or revoked.     Resp Syncytial Virus by PCR POSITIVE (A) NEGATIVE Final    Comment: (NOTE) Fact Sheet for Patients: EntrepreneurPulse.com.au  Fact Sheet for Healthcare Providers: IncredibleEmployment.be  This test is not yet approved or cleared by the [place] FDA and has been authorized for detection and/or diagnosis of SARS-CoV-2 by FDA under an Emergency Use Authorization (EUA). This EUA will remain in effect (meaning this test can be used) for the duration of the COVID-19 declaration under Section  564(b)(1) of the Act, 21 U.S.C. section 360bbb-3(b)(1), unless the authorization is terminated or revoked.  Performed at [facility], [place] [address] [place], [place] [address]      Labs: BNP (last 3 results) Recent Labs    08/07/21 1900 03/09/22 1325  BNP 387.3* 474.2*   Basic Metabolic Panel: Recent Labs  Lab 03/14/22 0211 03/15/22 0705 03/16/22 0748 03/17/22 0502 03/18/22 0659  NA 135 134* 138 135 135  K 4.3 4.6 4.6 4.6 4.6  CL 105 103 104 103 102  CO2 '24 23 26 27 26  '$ GLUCOSE 147* 74 78 92 93  BUN 29* 24* 27*  25* 28*  CREATININE 0.91 0.79 0.85 0.79 0.97  CALCIUM 8.4* 8.3* 8.3* 8.4* 8.3*  MG 2.2 2.2 2.2 2.3 2.2   Liver Function Tests: No results for input(s): "AST", "ALT", "ALKPHOS", "BILITOT", "PROT", "ALBUMIN" in the last 168 hours. No results for input(s): "LIPASE", "AMYLASE" in the last 168 hours. No results for input(s): "AMMONIA" in the last 168 hours. CBC: Recent Labs  Lab 03/14/22 0211 03/15/22 0705 03/16/22 0748 03/17/22 0502 03/18/22 0659  WBC 18.2* 19.6* 17.9* 17.8* 18.5*  NEUTROABS 16.6* 16.2* 14.2* 14.7* 15.7*  HGB 11.6* 11.9* 12.6 12.2 11.8*  HCT 36.8 36.3 38.5 37.5 35.9*  MCV 87.8 88.1 86.7 86.0 86.3  PLT 270 274 288 275 250   Cardiac Enzymes: No results for input(s): "CKTOTAL", "CKMB", "CKMBINDEX", "TROPONINI" in the last 168 hours. BNP: Invalid input(s): "POCBNP" CBG: No results for input(s): "GLUCAP" in the last 168 hours. D-Dimer No results for input(s): "DDIMER" in the last 72 hours. Hgb A1c No results for input(s): "HGBA1C" in the last 72 hours. Lipid Profile No results for input(s): "CHOL", "HDL", "LDLCALC", "TRIG", "CHOLHDL", "LDLDIRECT" in the last 72 hours. Thyroid function studies No results for input(s): "TSH", "T4TOTAL", "T3FREE", "THYROIDAB" in the last 72 hours.  Invalid input(s): "FREET3" Anemia work up No results for input(s): "VITAMINB12", "FOLATE", "FERRITIN", "TIBC", "IRON", "RETICCTPCT" in the last 72 hours. Urinalysis    Component Value Date/Time   COLORURINE YELLOW 03/12/2022 1319   APPEARANCEUR HAZY (A) 03/12/2022 1319   LABSPEC 1.025 03/12/2022 1319   PHURINE 5.0 03/12/2022 1319   GLUCOSEU NEGATIVE 03/12/2022 1319   HGBUR NEGATIVE 03/12/2022 1319   BILIRUBINUR NEGATIVE 03/12/2022 1319   BILIRUBINUR negative 03/21/2018 1328   KETONESUR NEGATIVE 03/12/2022 1319   PROTEINUR NEGATIVE 03/12/2022 1319   UROBILINOGEN negative (A) 03/21/2018 1328   UROBILINOGEN 0.2 12/08/2008 1204   NITRITE NEGATIVE 03/12/2022 1319   LEUKOCYTESUR  NEGATIVE 03/12/2022 1319   Sepsis Labs Recent Labs  Lab 03/15/22 0705 03/16/22 0748 03/17/22 0502 03/18/22 0659  WBC 19.6* 17.9* 17.8* 18.5*   Microbiology Recent Results (from the past 240 hour(s))  Resp panel by RT-PCR (RSV, Flu A&B, Covid) Anterior Nasal Swab     Status: Abnormal   Collection Time: 03/09/22  1:25 PM   Specimen: Anterior Nasal Swab  Result Value Ref Range Status   SARS Coronavirus 2 by RT PCR NEGATIVE NEGATIVE Final    Comment: (NOTE) SARS-CoV-2 target nucleic acids are NOT DETECTED.  The SARS-CoV-2 RNA is generally detectable in upper respiratory specimens during the acute phase of infection. The lowest concentration of SARS-CoV-2 viral copies this assay can detect is 138 copies/mL. A negative result does not preclude SARS-Cov-2 infection and should not be used as the sole basis for treatment or other patient management decisions. A negative result may occur with  improper specimen collection/handling, submission of specimen other than  nasopharyngeal swab, presence of viral mutation(s) within the areas targeted by this assay, and inadequate number of viral copies(<138 copies/mL). A negative result must be combined with clinical observations, patient history, and epidemiological information. The expected result is Negative.  Fact Sheet for Patients:  EntrepreneurPulse.com.au  Fact Sheet for Healthcare Providers:  IncredibleEmployment.be  This test is no t yet approved or cleared by the [place] FDA and  has been authorized for detection and/or diagnosis of SARS-CoV-2 by FDA under an Emergency Use Authorization (EUA). This EUA will remain  in effect (meaning this test can be used) for the duration of the COVID-19 declaration under Section 564(b)(1) of the Act, 21 U.S.C.section 360bbb-3(b)(1), unless the authorization is terminated  or revoked sooner.       Influenza A by PCR NEGATIVE NEGATIVE Final    Influenza B by PCR NEGATIVE NEGATIVE Final    Comment: (NOTE) The Xpert Xpress SARS-CoV-2/FLU/RSV plus assay is intended as an aid in the diagnosis of influenza from Nasopharyngeal swab specimens and should not be used as a sole basis for treatment. Nasal washings and aspirates are unacceptable for Xpert Xpress SARS-CoV-2/FLU/RSV testing.  Fact Sheet for Patients: EntrepreneurPulse.com.au  Fact Sheet for Healthcare Providers: IncredibleEmployment.be  This test is not yet approved or cleared by the [place] FDA and has been authorized for detection and/or diagnosis of SARS-CoV-2 by FDA under an Emergency Use Authorization (EUA). This EUA will remain in effect (meaning this test can be used) for the duration of the COVID-19 declaration under Section 564(b)(1) of the Act, 21 U.S.C. section 360bbb-3(b)(1), unless the authorization is terminated or revoked.     Resp Syncytial Virus by PCR POSITIVE (A) NEGATIVE Final    Comment: (NOTE) Fact Sheet for Patients: EntrepreneurPulse.com.au  Fact Sheet for Healthcare Providers: IncredibleEmployment.be  This test is not yet approved or cleared by the [place] FDA and has been authorized for detection and/or diagnosis of SARS-CoV-2 by FDA under an Emergency Use Authorization (EUA). This EUA will remain in effect (meaning this test can be used) for the duration of the COVID-19 declaration under Section 564(b)(1) of the Act, 21 U.S.C. section 360bbb-3(b)(1), unless the authorization is terminated or revoked.  Performed at [facility], [place] [address] [place], [place] [address]      Time coordinating discharge: 35 minutes  SIGNED:   Aline August, MD  [facility] 03/18/2022, 1:08 PM

## 2022-03-21 ENCOUNTER — Telehealth: Payer: Self-pay | Admitting: Cardiovascular Disease

## 2022-03-21 NOTE — Telephone Encounter (Signed)
Returned call to daughter and explained that the 03/23/22 appt was a 22mfollow-up that was routinely scheduled. SIsaias Sakaistates that Dr OAudie Boxsaw pt in hospital who changed her meds via stopping Diltiazem and starting Amiodarone and Lisinopril. She states he advised that they f/u in 4-6 weeks in the clinic. Says she told him they had an appt at the end of this month, but he advised he didn't think it was necessary. Pt's appt for 1/31 has been cancelled and post-hospital appt scheduled with [name] on 04/22/22.

## 2022-03-21 NOTE — Telephone Encounter (Signed)
Daughter called to check if the patient will need to keep the 1/31 hosp f/u appointment as the patient was seen in hospital by Dr. Audie Box and her medication was changed.

## 2022-03-23 ENCOUNTER — Ambulatory Visit: Payer: Medicare Other | Admitting: Physician Assistant

## 2022-03-28 DIAGNOSIS — R051 Acute cough: Secondary | ICD-10-CM | POA: Diagnosis not present

## 2022-03-28 DIAGNOSIS — K5900 Constipation, unspecified: Secondary | ICD-10-CM | POA: Diagnosis not present

## 2022-03-28 DIAGNOSIS — J189 Pneumonia, unspecified organism: Secondary | ICD-10-CM | POA: Diagnosis not present

## 2022-03-30 DIAGNOSIS — J189 Pneumonia, unspecified organism: Secondary | ICD-10-CM | POA: Diagnosis not present

## 2022-03-30 DIAGNOSIS — D649 Anemia, unspecified: Secondary | ICD-10-CM | POA: Diagnosis not present

## 2022-03-30 DIAGNOSIS — N182 Chronic kidney disease, stage 2 (mild): Secondary | ICD-10-CM | POA: Diagnosis not present

## 2022-03-31 DIAGNOSIS — I5032 Chronic diastolic (congestive) heart failure: Secondary | ICD-10-CM

## 2022-03-31 DIAGNOSIS — I480 Paroxysmal atrial fibrillation: Secondary | ICD-10-CM

## 2022-03-31 DIAGNOSIS — J441 Chronic obstructive pulmonary disease with (acute) exacerbation: Secondary | ICD-10-CM

## 2022-03-31 DIAGNOSIS — J9601 Acute respiratory failure with hypoxia: Secondary | ICD-10-CM

## 2022-04-06 DIAGNOSIS — F32A Depression, unspecified: Secondary | ICD-10-CM

## 2022-04-06 DIAGNOSIS — K5900 Constipation, unspecified: Secondary | ICD-10-CM | POA: Diagnosis not present

## 2022-04-06 DIAGNOSIS — J449 Chronic obstructive pulmonary disease, unspecified: Secondary | ICD-10-CM | POA: Diagnosis not present

## 2022-04-06 DIAGNOSIS — J189 Pneumonia, unspecified organism: Secondary | ICD-10-CM | POA: Diagnosis not present

## 2022-04-06 DIAGNOSIS — R42 Dizziness and giddiness: Secondary | ICD-10-CM | POA: Diagnosis not present

## 2022-04-08 DIAGNOSIS — N3281 Overactive bladder: Secondary | ICD-10-CM | POA: Diagnosis not present

## 2022-04-08 DIAGNOSIS — N182 Chronic kidney disease, stage 2 (mild): Secondary | ICD-10-CM | POA: Diagnosis not present

## 2022-04-08 DIAGNOSIS — D649 Anemia, unspecified: Secondary | ICD-10-CM | POA: Diagnosis not present

## 2022-04-12 DIAGNOSIS — Z7689 Persons encountering health services in other specified circumstances: Secondary | ICD-10-CM | POA: Diagnosis not present

## 2022-04-12 DIAGNOSIS — J189 Pneumonia, unspecified organism: Secondary | ICD-10-CM | POA: Diagnosis not present

## 2022-04-18 ENCOUNTER — Emergency Department (HOSPITAL_COMMUNITY): Payer: Medicare Other

## 2022-04-18 ENCOUNTER — Other Ambulatory Visit: Payer: Self-pay

## 2022-04-18 ENCOUNTER — Inpatient Hospital Stay (HOSPITAL_COMMUNITY)
Admission: EM | Admit: 2022-04-18 | Discharge: 2022-04-29 | DRG: 308 | Disposition: A | Discharge status: Skilled Nursing Facility | Payer: Medicare Other | Source: Skilled Nursing Facility | Attending: Family Medicine | Admitting: Family Medicine

## 2022-04-18 ENCOUNTER — Encounter (HOSPITAL_COMMUNITY): Payer: Self-pay | Admitting: Emergency Medicine

## 2022-04-18 DIAGNOSIS — Z800 Family history of malignant neoplasm of digestive organs: Secondary | ICD-10-CM

## 2022-04-18 DIAGNOSIS — Z6822 Body mass index (BMI) 22.0-22.9, adult: Secondary | ICD-10-CM

## 2022-04-18 DIAGNOSIS — W19XXXA Unspecified fall, initial encounter: Secondary | ICD-10-CM | POA: Diagnosis not present

## 2022-04-18 DIAGNOSIS — K219 Gastro-esophageal reflux disease without esophagitis: Secondary | ICD-10-CM | POA: Diagnosis present

## 2022-04-18 DIAGNOSIS — D860 Sarcoidosis of lung: Secondary | ICD-10-CM | POA: Diagnosis present

## 2022-04-18 DIAGNOSIS — J181 Lobar pneumonia, unspecified organism: Secondary | ICD-10-CM | POA: Diagnosis present

## 2022-04-18 DIAGNOSIS — J90 Pleural effusion, not elsewhere classified: Secondary | ICD-10-CM

## 2022-04-18 DIAGNOSIS — Z881 Allergy status to other antibiotic agents status: Secondary | ICD-10-CM

## 2022-04-18 DIAGNOSIS — L8990 Pressure ulcer of unspecified site, unspecified stage: Secondary | ICD-10-CM | POA: Insufficient documentation

## 2022-04-18 DIAGNOSIS — E785 Hyperlipidemia, unspecified: Secondary | ICD-10-CM | POA: Diagnosis present

## 2022-04-18 DIAGNOSIS — Z7901 Long term (current) use of anticoagulants: Secondary | ICD-10-CM

## 2022-04-18 DIAGNOSIS — Z801 Family history of malignant neoplasm of trachea, bronchus and lung: Secondary | ICD-10-CM

## 2022-04-18 DIAGNOSIS — E860 Dehydration: Secondary | ICD-10-CM | POA: Diagnosis present

## 2022-04-18 DIAGNOSIS — E876 Hypokalemia: Secondary | ICD-10-CM | POA: Insufficient documentation

## 2022-04-18 DIAGNOSIS — D649 Anemia, unspecified: Secondary | ICD-10-CM | POA: Diagnosis present

## 2022-04-18 DIAGNOSIS — R339 Retention of urine, unspecified: Secondary | ICD-10-CM | POA: Diagnosis present

## 2022-04-18 DIAGNOSIS — J441 Chronic obstructive pulmonary disease with (acute) exacerbation: Secondary | ICD-10-CM | POA: Diagnosis present

## 2022-04-18 DIAGNOSIS — R338 Other retention of urine: Secondary | ICD-10-CM

## 2022-04-18 DIAGNOSIS — I2510 Atherosclerotic heart disease of native coronary artery without angina pectoris: Secondary | ICD-10-CM | POA: Diagnosis present

## 2022-04-18 DIAGNOSIS — R52 Pain, unspecified: Secondary | ICD-10-CM

## 2022-04-18 DIAGNOSIS — R32 Unspecified urinary incontinence: Secondary | ICD-10-CM | POA: Diagnosis present

## 2022-04-18 DIAGNOSIS — F0394 Unspecified dementia, unspecified severity, with anxiety: Secondary | ICD-10-CM | POA: Diagnosis present

## 2022-04-18 DIAGNOSIS — R627 Adult failure to thrive: Secondary | ICD-10-CM

## 2022-04-18 DIAGNOSIS — R4589 Other symptoms and signs involving emotional state: Secondary | ICD-10-CM

## 2022-04-18 DIAGNOSIS — I480 Paroxysmal atrial fibrillation: Secondary | ICD-10-CM | POA: Diagnosis not present

## 2022-04-18 DIAGNOSIS — K5909 Other constipation: Secondary | ICD-10-CM | POA: Diagnosis present

## 2022-04-18 DIAGNOSIS — Z96612 Presence of left artificial shoulder joint: Secondary | ICD-10-CM | POA: Diagnosis present

## 2022-04-18 DIAGNOSIS — Z882 Allergy status to sulfonamides status: Secondary | ICD-10-CM

## 2022-04-18 DIAGNOSIS — I4891 Unspecified atrial fibrillation: Secondary | ICD-10-CM | POA: Diagnosis present

## 2022-04-18 DIAGNOSIS — L89153 Pressure ulcer of sacral region, stage 3: Secondary | ICD-10-CM | POA: Diagnosis present

## 2022-04-18 DIAGNOSIS — R0600 Dyspnea, unspecified: Principal | ICD-10-CM

## 2022-04-18 DIAGNOSIS — J449 Chronic obstructive pulmonary disease, unspecified: Secondary | ICD-10-CM | POA: Diagnosis present

## 2022-04-18 DIAGNOSIS — R140 Abdominal distension (gaseous): Secondary | ICD-10-CM | POA: Diagnosis not present

## 2022-04-18 DIAGNOSIS — I2489 Other forms of acute ischemic heart disease: Secondary | ICD-10-CM | POA: Diagnosis present

## 2022-04-18 DIAGNOSIS — R54 Age-related physical debility: Secondary | ICD-10-CM | POA: Diagnosis present

## 2022-04-18 DIAGNOSIS — I9589 Other hypotension: Secondary | ICD-10-CM

## 2022-04-18 DIAGNOSIS — I959 Hypotension, unspecified: Secondary | ICD-10-CM | POA: Diagnosis present

## 2022-04-18 DIAGNOSIS — K222 Esophageal obstruction: Secondary | ICD-10-CM | POA: Diagnosis present

## 2022-04-18 DIAGNOSIS — M419 Scoliosis, unspecified: Secondary | ICD-10-CM | POA: Diagnosis present

## 2022-04-18 DIAGNOSIS — M6281 Muscle weakness (generalized): Secondary | ICD-10-CM | POA: Diagnosis not present

## 2022-04-18 DIAGNOSIS — Z515 Encounter for palliative care: Secondary | ICD-10-CM

## 2022-04-18 DIAGNOSIS — Z7189 Other specified counseling: Secondary | ICD-10-CM

## 2022-04-18 DIAGNOSIS — E440 Moderate protein-calorie malnutrition: Secondary | ICD-10-CM

## 2022-04-18 DIAGNOSIS — I110 Hypertensive heart disease with heart failure: Secondary | ICD-10-CM | POA: Diagnosis present

## 2022-04-18 DIAGNOSIS — Z8249 Family history of ischemic heart disease and other diseases of the circulatory system: Secondary | ICD-10-CM

## 2022-04-18 DIAGNOSIS — Z9181 History of falling: Secondary | ICD-10-CM

## 2022-04-18 DIAGNOSIS — M810 Age-related osteoporosis without current pathological fracture: Secondary | ICD-10-CM | POA: Diagnosis present

## 2022-04-18 DIAGNOSIS — Z885 Allergy status to narcotic agent status: Secondary | ICD-10-CM

## 2022-04-18 DIAGNOSIS — J9611 Chronic respiratory failure with hypoxia: Secondary | ICD-10-CM

## 2022-04-18 DIAGNOSIS — Z79899 Other long term (current) drug therapy: Secondary | ICD-10-CM

## 2022-04-18 DIAGNOSIS — B9789 Other viral agents as the cause of diseases classified elsewhere: Secondary | ICD-10-CM | POA: Diagnosis present

## 2022-04-18 DIAGNOSIS — E162 Hypoglycemia, unspecified: Secondary | ICD-10-CM | POA: Diagnosis not present

## 2022-04-18 DIAGNOSIS — I5032 Chronic diastolic (congestive) heart failure: Secondary | ICD-10-CM | POA: Diagnosis present

## 2022-04-18 DIAGNOSIS — D72829 Elevated white blood cell count, unspecified: Secondary | ICD-10-CM

## 2022-04-18 DIAGNOSIS — R2689 Other abnormalities of gait and mobility: Secondary | ICD-10-CM | POA: Diagnosis not present

## 2022-04-18 DIAGNOSIS — Z66 Do not resuscitate: Secondary | ICD-10-CM | POA: Diagnosis present

## 2022-04-18 DIAGNOSIS — Z7982 Long term (current) use of aspirin: Secondary | ICD-10-CM

## 2022-04-18 DIAGNOSIS — Z1152 Encounter for screening for COVID-19: Secondary | ICD-10-CM

## 2022-04-18 DIAGNOSIS — Z7951 Long term (current) use of inhaled steroids: Secondary | ICD-10-CM

## 2022-04-18 DIAGNOSIS — R1310 Dysphagia, unspecified: Secondary | ICD-10-CM

## 2022-04-18 LAB — COMPREHENSIVE METABOLIC PANEL
ALT: 13 U/L (ref 0–44)
AST: 19 U/L (ref 15–41)
Albumin: 2.8 g/dL — ABNORMAL LOW (ref 3.5–5.0)
Alkaline Phosphatase: 59 U/L (ref 38–126)
Anion gap: 12 (ref 5–15)
BUN: 24 mg/dL — ABNORMAL HIGH (ref 8–23)
CO2: 22 mmol/L (ref 22–32)
Calcium: 7.9 mg/dL — ABNORMAL LOW (ref 8.9–10.3)
Chloride: 104 mmol/L (ref 98–111)
Creatinine, Ser: 0.87 mg/dL (ref 0.44–1.00)
GFR, Estimated: >60 mL/min (ref >60)
Glucose, Bld: 65 mg/dL — ABNORMAL LOW (ref 70–99)
Potassium: 3.7 mmol/L (ref 3.5–5.1)
Sodium: 138 mmol/L (ref 135–145)
Total Bilirubin: 1.1 mg/dL (ref 0.3–1.2)
Total Protein: 5.3 g/dL — ABNORMAL LOW (ref 6.5–8.1)

## 2022-04-18 LAB — BLOOD GAS, VENOUS
Acid-Base Excess: 0.1 mmol/L (ref 0.0–2.0)
Bicarbonate: 26 mmol/L (ref 20.0–28.0)
O2 Saturation: 40.2 %
Patient temperature: 37
pCO2, Ven: 46 mmHg (ref 44–60)
pH, Ven: 7.36 (ref 7.25–7.43)
pO2, Ven: <31 mmHg — CL (ref 32–45)

## 2022-04-18 LAB — LIPASE, BLOOD: Lipase: 25 U/L (ref 11–51)

## 2022-04-18 LAB — CBC WITH DIFFERENTIAL/PLATELET
Abs Immature Granulocytes: 0.79 10*3/uL — ABNORMAL HIGH (ref 0.00–0.07)
Basophils Absolute: 0.1 10*3/uL (ref 0.0–0.1)
Basophils Relative: 1 %
Eosinophils Absolute: 0 10*3/uL (ref 0.0–0.5)
Eosinophils Relative: 0 %
HCT: 42.9 % (ref 36.0–46.0)
Hemoglobin: 13.2 g/dL (ref 12.0–15.0)
Immature Granulocytes: 5 %
Lymphocytes Relative: 11 %
Lymphs Abs: 1.6 10*3/uL (ref 0.7–4.0)
MCH: 28.3 pg (ref 26.0–34.0)
MCHC: 30.8 g/dL (ref 30.0–36.0)
MCV: 91.9 fL (ref 80.0–100.0)
Monocytes Absolute: 0.7 10*3/uL (ref 0.1–1.0)
Monocytes Relative: 5 %
Neutro Abs: 11.5 10*3/uL — ABNORMAL HIGH (ref 1.7–7.7)
Neutrophils Relative %: 78 %
Platelets: 256 10*3/uL (ref 150–400)
RBC: 4.67 MIL/uL (ref 3.87–5.11)
RDW: 18.2 % — ABNORMAL HIGH (ref 11.5–15.5)
WBC: 14.8 10*3/uL — ABNORMAL HIGH (ref 4.0–10.5)
nRBC: 0 % (ref 0.0–0.2)

## 2022-04-18 LAB — RESP PANEL BY RT-PCR (RSV, FLU A&B, COVID)  RVPGX2
Influenza A by PCR: NEGATIVE
Influenza B by PCR: NEGATIVE
Resp Syncytial Virus by PCR: NEGATIVE
SARS Coronavirus 2 by RT PCR: NEGATIVE

## 2022-04-18 LAB — URINALYSIS, ROUTINE W REFLEX MICROSCOPIC
Bilirubin Urine: NEGATIVE
Glucose, UA: NEGATIVE mg/dL
Hgb urine dipstick: NEGATIVE
Ketones, ur: 5 mg/dL — AB
Leukocytes,Ua: NEGATIVE
Nitrite: NEGATIVE
Protein, ur: NEGATIVE mg/dL
Specific Gravity, Urine: 1.018 (ref 1.005–1.030)
pH: 5 (ref 5.0–8.0)

## 2022-04-18 LAB — BRAIN NATRIURETIC PEPTIDE: B Natriuretic Peptide: 290.4 pg/mL — ABNORMAL HIGH (ref 0.0–100.0)

## 2022-04-18 LAB — TSH: TSH: 1.296 u[IU]/mL (ref 0.350–4.500)

## 2022-04-18 LAB — TROPONIN I (HIGH SENSITIVITY): Troponin I (High Sensitivity): 22 ng/L — ABNORMAL HIGH (ref <18)

## 2022-04-18 MED ORDER — ENSURE ENLIVE PO LIQD
237.0000 mL | Freq: Two times a day (BID) | ORAL | Status: DC
  Administered 2022-04-20 – 2022-04-24 (×5): 237 mL via ORAL
  Filled 2022-04-18 (×2): qty 237

## 2022-04-18 MED ORDER — ALUM & MAG HYDROXIDE-SIMETH 200-200-20 MG/5ML PO SUSP
30.0000 mL | Freq: Once | ORAL | Status: AC
  Administered 2022-04-18: 30 mL via ORAL
  Filled 2022-04-18: qty 30

## 2022-04-18 MED ORDER — AMIODARONE HCL IN DEXTROSE 360-4.14 MG/200ML-% IV SOLN
30.0000 mg/h | INTRAVENOUS | Status: DC
  Administered 2022-04-19: 30 mg/h via INTRAVENOUS
  Filled 2022-04-18: qty 200

## 2022-04-18 MED ORDER — IPRATROPIUM-ALBUTEROL 0.5-2.5 (3) MG/3ML IN SOLN
3.0000 mL | Freq: Once | RESPIRATORY_TRACT | Status: AC
  Administered 2022-04-18: 3 mL via RESPIRATORY_TRACT
  Filled 2022-04-18: qty 3

## 2022-04-18 MED ORDER — AMIODARONE LOAD VIA INFUSION
150.0000 mg | Freq: Once | INTRAVENOUS | Status: AC
  Administered 2022-04-18: 150 mg via INTRAVENOUS
  Filled 2022-04-18: qty 83.34

## 2022-04-18 MED ORDER — FAMOTIDINE IN NACL 20-0.9 MG/50ML-% IV SOLN
20.0000 mg | Freq: Once | INTRAVENOUS | Status: AC
  Administered 2022-04-18: 20 mg via INTRAVENOUS
  Filled 2022-04-18: qty 50

## 2022-04-18 MED ORDER — AMIODARONE HCL IN DEXTROSE 360-4.14 MG/200ML-% IV SOLN
60.0000 mg/h | INTRAVENOUS | Status: DC
  Administered 2022-04-18: 60 mg/h via INTRAVENOUS
  Filled 2022-04-18 (×2): qty 200

## 2022-04-18 MED ORDER — IOHEXOL 350 MG/ML SOLN
80.0000 mL | Freq: Once | INTRAVENOUS | Status: AC | PRN
  Administered 2022-04-18: 80 mL via INTRAVENOUS

## 2022-04-18 MED ORDER — LACTATED RINGERS IV BOLUS
1000.0000 mL | Freq: Once | INTRAVENOUS | Status: AC
  Administered 2022-04-18: 1000 mL via INTRAVENOUS

## 2022-04-18 NOTE — Assessment & Plan Note (Addendum)
HR controlled - Continue home amiodarone - Resume Eliquis

## 2022-04-18 NOTE — Assessment & Plan Note (Addendum)
No wheezing Respiratory failure ruled out. - Resume home ICS/LABA

## 2022-04-18 NOTE — Assessment & Plan Note (Signed)
Intermittent soft BP secondary to a.fib with RVR -on amiodarone infusion and holding all antihypertensives  -continue to monitor

## 2022-04-18 NOTE — Assessment & Plan Note (Signed)
In the setting of COPD/pulmonary sarcodosis Discharged with supplement oxygen at the end of January and continued to have requirements in SNF. 3L appears to be her baseline at this time.

## 2022-04-18 NOTE — ED Triage Notes (Signed)
Pt bib EMS for increase SOB, fatigue and decreased appetite. PT from facility and EMS was told pt hasn't wanted to eat in a few days. Pt is alert and oriented.   EMS Vital Signs Bp: 132/70 HR 74 RR 18 O2 96% on 3l [place]  CBG 80

## 2022-04-18 NOTE — Assessment & Plan Note (Addendum)
Eating well today

## 2022-04-18 NOTE — Assessment & Plan Note (Addendum)
-   Hold aspirin

## 2022-04-18 NOTE — Assessment & Plan Note (Signed)
-  continue PPI. CT A/P shows esophagitis.  -Pt has been having low appetite. Has not been complaining of dysphagia but will obtain speech therapy to evaluate.

## 2022-04-18 NOTE — Assessment & Plan Note (Addendum)
On admission, had difficulty urinating, had foley placed with over 1L return, subsequently breathing normalized and she was weaned off O2.   UA ruled out infection

## 2022-04-18 NOTE — H&P (Signed)
History and Physical    Patient: [name] [id number] DOB: 10-05-1934 DOA: 04/18/2022 DOS: the patient was seen and examined on 04/18/2022 PCP: Garwin Brothers, MD  Patient coming from: SNF  Chief Complaint:  Chief Complaint  Patient presents with   Shortness of Breath   HPI: [name] is a 88 y.o. female with medical history significant of A-fib on amiodarone and metoprolol, PSVT, pulmonary sarcoid, scoliosis, HLD, HTN, COPD, bronchiectasis, CAD, low back pain, anemia, GERD, diastolic CHF presented with non-specific symptoms of weakness, fatigue and decrease appetite.   History obtained from daughter at bedside. Pt was just admitted towards the end of January for acute respiratory failure secondary to COPD exacerbation and RSV and discharged on  oxygen supplementation.Hospital course also complicated by a.fib with RVR and was started on amiodarone for 21 days by cardiology. Noted to be very deconditioned and was discharge to SNF.   Since being in SNF, she continues to have gradual decline. She was treated with aspiration pneumonia. Continues to be weak and has trouble working with PT due to weakness. Low appetite and early satiety. Fatigue just getting out of bed to get dressed.   In the ED, she presented on baseline 3L and temperature of 43F, in a.fib with RVR HR up to 140. BP low at times in 96/70.  WBC of 14.8, Hgb of 13.2.   Na of 138, K of 3.7, Creatinine of 0.87.  BNP at 290. Troponin of 22.   Negative Flu/COVID/RSV  CT head and cervical spine negative for acute findings  CT chest negative for pulmonary embolism. There is worsening bilateral effusions worse on the left.   CT A/P shows possible esophagitis and reflux.  UA pending.  EDP discussed with on call [facility] cardiologist who recommended keep her on amiodarone infusion and holding off on antihypertensives. Hospitalist consulted for admission.  Review of Systems: unable to review all systems due to the  inability of the patient to answer questions. Past Medical History:  Diagnosis Date   Acute maxillary sinusitis 10/17/2017   Acute posthemorrhagic anemia 08/22/2012   Acute renal failure syndrome ([place]) 01/04/2019   Allergy    SEASONAL   Anemia    Arthritis    Asthma    "slight"    Baker's cyst of knee, right    CAD (coronary artery disease)    a. Myoview 10/15 - normal EF 70% // Myoview 11/16: EF 75%, normal perfusion, low risk // c. LHC 3/17 - LAD irregs, oLCx 70 (neg FFR), mRCA 30, EF 55-65% >> med Rx // Myoview 07/2018:  EF 73, extracardiac uptake, no significant ischemia (reviewed with Dr. Burt Knack), Low Risk // Myoview 6/22: EF 72, normal perfusion, low risk   Carotid stenosis    a. Carotid US 9/15 - Bilateral 1-39% ICA >> FU 2 years // b. Bilateral ICA 1-39 >> FU prn // Carotid US 06/2018: bilat 1-39; fu prn   Closed fracture of right olecranon process 05/09/2017   Closed left subtrochanteric femur fracture S/P Open/closed and reduction, internal medullary fixation  09/05/2012   Dyspnea    due to Sarcoidosis. When is windy or humed   Dysrhythmia    fast heart rate   Edema 10/30/2019   Elbow fracture 04/2017   Right, had surgery   Esophageal reflux    Fracture of inferior pubic ramus ([place]) 02/28/2019   Hematochezia    Hip fracture ([place]) 12/12/2018   History of echocardiogram    a. Echo 10/15 - EF 60-65%,  no RWMA   HLD (hyperlipidemia)    Hx of colonoscopy    Hydronephrosis 07/29/2019   Left foot pain 09/02/2014   Assessment: 88 year old with acquired leg length discrepancy on the left who presents with 10 days of acute onset left foot pain. She seemed to be doing well prior to this pain with her orthotics that had been made to correct her leg length discrepancy. Negative x-rays at outside office which were again reviewed today.   This pain does not seem to impair her function and which has actually been im   Macular pucker, right eye    will have removed on 01/26/21    Osteoporosis    Other chronic pulmonary heart diseases    Paroxysmal supraventricular tachycardia    Pneumonia    Pneumonia 09/18/2019   Pre-op evaluation 06/11/2019   Pyelonephritis 07/29/2019   Sacral insufficiency fracture 12/12/2018   Sarcoidosis    Sepsis ([place]) 07/30/2019   Sepsis due to Escherichia coli (e. 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   COLONOSCOPY     CYSTOSCOPY W/ RETROGRADES Left 07/30/2019   Procedure: CYSTOSCOPY WITH RETROGRADE PYELOGRAM LEFT STENT PLACEMENT;  Surgeon: Ardis Hughs, MD;  Location: [facility];  Service: Urology;  Laterality: Left;   CYSTOSCOPY WITH RETROGRADE PYELOGRAM, URETEROSCOPY AND STENT PLACEMENT Left 08/20/2019   Procedure: CYSTOSCOPY, URETEROSCOPY AND STENT PLACEMENT;  Surgeon: Robley Fries, MD;  Location: [facility];  Service: Urology;  Laterality: Left;  1 HR   FOOT SURGERY Right    HIP SURGERY Right 2011   full replacement   HOLMIUM LASER APPLICATION Left [id number]   Procedure: HOLMIUM LASER APPLICATION;  Surgeon: Robley Fries, MD;  Location: [facility];  Service: Urology;  Laterality: Left;   LEFT HEART CATH AND CORONARY ANGIOGRAPHY N/A 08/09/2021   Procedure: LEFT HEART CATH AND CORONARY ANGIOGRAPHY;  Surgeon: Sherren Mocha, MD;  Location: [facility];  Service: Cardiovascular;  Laterality: N/A;   LEG SURGERY Left 06/2012   femur fracture s/p open and closed reduction in [place], Dr. Jimmye Norman   lens replacement Right    right eye   ORIF ELBOW FRACTURE Right 05/09/2017   Procedure: ORIF right  olecranon fracture with repair/reconstruction, ulnar nerve transposition as needed;  Surgeon: Roseanne Kaufman, MD;  Location: [facility];  Service: Orthopedics;  Laterality: Right;  Requests 90 mins   pelvis fracture     POLYPECTOMY     REVERSE SHOULDER ARTHROPLASTY Left 06/13/2019   Procedure: REVERSE SHOULDER ARTHROPLASTY;  Surgeon: Justice Britain, MD;  Location: [facility];  Service: Orthopedics;  Laterality: Left;  142mn   SHOULDER ARTHROSCOPY W/ ROTATOR CUFF REPAIR Right    TRAPEZIUM RESECTION Right    Social History:  reports that she has never smoked. She has never used smokeless tobacco. She reports that she does not drink alcohol and does not use drugs.  Allergies  Allergen Reactions   Levaquin [Levofloxacin] Other (See Comments)    Joint pain.. Per doctor not to take again   Oxycodone Nausea Only    Can take  oxycodone with Zofran Other reaction(s): vomiting   Sulfonamide Derivatives Other (See Comments)    Possibly caused hepatitis   Sulfa Antibiotics Other (See Comments)    Possibly caused hepatitis   Morphine Nausea Only    Family History  Problem Relation Age of Onset   Colon cancer Mother 80   Cancer Mother    Lung cancer Father    Heart disease Father    Arrhythmia Father    Cancer Father    Cancer Sister        ovarian   Heart attack Brother    Esophageal cancer Neg Hx    Rectal cancer Neg Hx    Stomach cancer Neg Hx    Stroke Neg Hx     Prior to Admission medications   Medication Sig Start Date End Date Taking? Authorizing Provider  acetaminophen (TYLENOL) 500 MG tablet Take 1,000 mg by mouth every 6 (six) hours as needed for mild pain.     [provider]  amiodarone (PACERONE) 200 MG tablet Take 1 tablet (200 mg total) by mouth daily for 21 days. 03/19/22 04/09/22  Aline August, MD  aspirin 81 MG chewable tablet Chew 81 mg by mouth daily.    [provider]  Budeson-Glycopyrrol-Formoterol (BREZTRI AEROSPHERE) 160-9-4.8 MCG/ACT AERO Inhale 2  puffs into the lungs in the morning and at bedtime. 09/29/21   Collene Gobble, MD  Calcium Carbonate Antacid (TUMS ULTRA 1000 PO) Take 2,000-3,000 mg by mouth at bedtime as needed (acid reflux/indigestion/heartburn).    [provider]  denosumab (PROLIA) 60 MG/ML SOLN injection Inject 60 mg into the skin every 6 (six) months. Administer in upper arm, thigh, or abdomen    [provider]  ELIQUIS 2.5 MG TABS tablet TAKE 1 TABLET(2.5 MG) BY MOUTH TWICE DAILY Patient taking differently: Take 2.5 mg by mouth 2 (two) times daily. 01/31/22   Sherren Mocha, MD  guaiFENesin (MUCINEX) 600 MG 12 hr tablet Take 2 tablets (1,200 mg total) by mouth 2 (two) times daily. 03/18/22   Aline August, MD  ipratropium-albuterol (DUONEB) 0.5-2.5 (3) MG/3ML SOLN Take 3 mLs by nebulization every 6 (six) hours as needed (wheezing; shortness of breath). 10/22/21   Cobb, Karie Schwalbe, NP  lisinopril (ZESTRIL) 20 MG tablet Take 1 tablet (20 mg total) by mouth daily. 03/19/22   Aline August, MD  metoprolol tartrate (LOPRESSOR) 25 MG tablet Take 1 tablet (25 mg total) by mouth daily as needed (palpitations). 08/23/21   [name] T, PA-C  nitroGLYCERIN (NITROSTAT) 0.4 MG SL tablet Place 1 tablet (0.4 mg total) under the tongue every 5 (five) minutes as needed for chest pain. 11/06/19   [name] T, PA-C  omeprazole (PRILOSEC) 40 MG capsule Take 40 mg by mouth 2 (two) times daily.  11/03/13   [provider]  oxybutynin (DITROPAN) 5 MG tablet Take 5 mg by mouth at bedtime. 08/25/20   [provider]  predniSONE (DELTASONE) 10 MG tablet '20mg'$  daily for 5 days then '10mg'$  daily for 5 days then stop 03/19/22   Aline August, MD  sertraline (ZOLOFT) 50 MG tablet Take 1 tablet (50 mg total) by mouth at bedtime. 12/23/21   Melvenia Beam, MD  VENTOLIN HFA 108 (90 Base) MCG/ACT inhaler INHALE 2 PUFFS INTO THE LUNGS EVERY 4 HOURS AS NEEDED FOR WHEEZING OR SHORTNESS OF BREATH 02/15/22   Collene Gobble, MD   Vibegron (GEMTESA) 75 MG TABS Take 75 mg by mouth at bedtime.    [provider]    Physical Exam: Vitals:   04/18/22 1829 04/18/22 2000 04/18/22 2015 04/18/22 2030  BP: 96/71 109/69    Pulse: (!) 145 (!) 123  (!) 121  Resp: '20 17  20  '$ Temp: 97.7 F (36.5 C)  97.7 F (36.5 C)   TempSrc:   Oral   SpO2: 96% 98%  99%  Weight:      Height:       Constitutional: NAD, thin chronically ill-appearing female laying bed Eyes: lids and conjunctivae normal ENMT: Mucous membranes are moist. Neck: normal, supple Respiratory: clear to auscultation bilaterally, no wheezing, no crackles. Normal respiratory effort. No accessory muscle use.  Cardiovascular: Regular rate and rhythm, no murmurs / rubs / gallops. No extremity edema.  Abdomen: soft, non-distended, no tenderness,  Bowel sounds positive.  Musculoskeletal: no clubbing / cyanosis. No joint deformity upper and lower extremities. Good ROM, no contractures. Normal muscle tone.  Skin: no rashes, lesions, ulcers.  Neurologic: CN 2-12 grossly intact.   Psychiatric: Alert and oriented x 3. Normal mood. Data Reviewed:  See HPI  Assessment and Plan: * Atrial fibrillation with RVR (HCC) -HR up to 145 with intermittent soft BP -cardiology consulted by EDP recommends keeping on amiodarone infusion overnight and holding off antihypertensives. She is on oral amiodarone at SNF.  -keep on continuous telemetry  CAD (coronary artery disease) -continue aspirin   Leukocytosis -Appears chronically elevated although downward trending. She was on steroids towards end of January as well.  Chronic heart failure with preserved ejection fraction (HCC) -stable.  -has increasing bilateral pleural effusions on CT chest worse on the left but could be due to a.fib RVR. Remains stable on baseline 3L. Hold off on IV diuretics for now due to intermittent low blood pressures   COPD (chronic obstructive pulmonary disease) (HCC) -stable. Not in  exacerbation -continue home bronchodilator  GERD (gastroesophageal reflux disease) -continue PPI. CT A/P shows esophagitis.  -Pt has been having low appetite. Has not been complaining of dysphagia but will obtain speech therapy to evaluate.   Acute urinary retention -Has over 1L on bladder scan and had indwelling foley catheter placed. UA was negative for infection.  Chronic hypoxic respiratory failure (HCC) In the setting of COPD/pulmonary sarcodosis Discharged with supplement oxygen at the end of January and continued to have requirements in SNF. 3L appears to be her baseline at this time.   Failure to thrive in adult -pt was discharged to SNF at the end of January after hospitalization for COPD exacerbation/A.fib with RVR and has continued to decline -extension workup today with CT head, C-spine, CTA chest/abd/pelvis, UA and TSH without infectious or metabolic cause  to her decline. Unfortunately this could be more rapid decline of her health due to normal course of aging in the setting of her worsening comorbities.  -has low appetite. Daughter inquired about feeding tube and I discussed this would to be an absolute last resort to someone who cannot tolerate oral intake. Also pt's risk with her comorbities would be much greater than benefit to undergo such an invasive procedure. Will continue with adding protein supplement and dietician consult.   Hypotension Intermittent soft BP secondary to a.fib with RVR -on amiodarone infusion and holding all antihypertensives  -continue to monitor       Advance Care Planning: DNR  Consults: NONE  Family Communication: daughter at bedside  Severity of Illness: The appropriate patient status for this patient is OBSERVATION. Observation status is judged to be reasonable and necessary  in order to provide the required intensity of service to ensure the patient's safety. The patient's presenting symptoms, physical exam findings, and initial  radiographic and laboratory data in the context of their medical condition is felt to place them at decreased risk for further clinical deterioration. Furthermore, it is anticipated that the patient will be medically stable for discharge from the hospital within 2 midnights of admission.   Author: Orene Desanctis, DO 04/18/2022 9:46 PM  For on call review [email or web address].

## 2022-04-18 NOTE — ED Provider Notes (Signed)
[facility] Provider Note   CSN: [id number] Arrival date & time: 04/18/22  1548     History  Chief Complaint  Patient presents with   Shortness of Breath    [name] is a 88 y.o. female. With pmh A-fib on Eliquis, PSVT, pulmonary sarcoid, scoliosis, HLD, HTN, COPD, bronchiectasis, CAD, diastolic CHF, dementia discharged 03/18/22 for COPD exacerbation 2/2 RSV presenting with shortness of breath, generalized fatigue, decreased appetite for the past few days.  She is on 3 L nasal cannula at baseline.  Patient not the best historian but when asking her questions about shortness of breath she just continuously says I am just feeling short of breath.  However she says I always feel short of breath with her sarcoid and COPD.  She has been eating slightly less and not getting out of bed as often.  She is currently at a nursing facility where she gets all of her daily medications and is not missing doses of her Eliquis.  There has been no leg pain or swelling.  She denies any cough more than usual or change in sputum production.  She is denying any pain and denies any chest pain.  I got some more information from the family members her daughter over the phone.  According to family member, she has been having increased fatigue and generalized weakness over the past 2 weeks.  She has been eating much less than usual but has noted some increased abdominal distention.  She also had a recent fall.  She has just not been acting right.   Shortness of Breath      Home Medications Prior to Admission medications   Medication Sig Start Date End Date Taking? Authorizing Provider  acetaminophen (TYLENOL) 500 MG tablet Take 1,000 mg by mouth every 6 (six) hours as needed for mild pain.    Yes [provider]  aspirin 81 MG chewable tablet Chew 81 mg by mouth daily.   Yes [provider]  Budeson-Glycopyrrol-Formoterol (BREZTRI  AEROSPHERE) 160-9-4.8 MCG/ACT AERO Inhale 2 puffs into the lungs in the morning and at bedtime. 09/29/21  Yes Collene Gobble, MD  Calcium Carbonate Antacid (TUMS ULTRA 1000 PO) Take 2,000-3,000 mg by mouth at bedtime as needed (acid reflux/indigestion/heartburn).   Yes [provider]  ELIQUIS 2.5 MG TABS tablet TAKE 1 TABLET(2.5 MG) BY MOUTH TWICE DAILY Patient taking differently: Take 2.5 mg by mouth 2 (two) times daily. 01/31/22  Yes Sherren Mocha, MD  guaiFENesin (MUCINEX) 600 MG 12 hr tablet Take 2 tablets (1,200 mg total) by mouth 2 (two) times daily. 03/18/22  Yes Aline August, MD  ipratropium-albuterol (DUONEB) 0.5-2.5 (3) MG/3ML SOLN Take 3 mLs by nebulization every 6 (six) hours as needed (wheezing; shortness of breath). 10/22/21  Yes Cobb, Karie Schwalbe, NP  lisinopril (ZESTRIL) 20 MG tablet Take 1 tablet (20 mg total) by mouth daily. 03/19/22  Yes Aline August, MD  metoprolol tartrate (LOPRESSOR) 25 MG tablet Take 1 tablet (25 mg total) by mouth daily as needed (palpitations). 08/23/21  Yes Weaver, [name] T, PA-C  nitroGLYCERIN (NITROSTAT) 0.4 MG SL tablet Place 1 tablet (0.4 mg total) under the tongue every 5 (five) minutes as needed for chest pain. 11/06/19  Yes Weaver, [name] T, PA-C  omeprazole (PRILOSEC) 40 MG capsule Take 40 mg by mouth in the morning and at bedtime.   Yes [provider]  oxybutynin (DITROPAN) 5 MG tablet Take 5 mg by mouth at  bedtime. 08/25/20  Yes [provider]  polyethylene glycol (MIRALAX / GLYCOLAX) 17 g packet Take 17 g by mouth daily.   Yes [provider]  sertraline (ZOLOFT) 25 MG tablet Take 25 mg by mouth daily.   Yes [provider]  sertraline (ZOLOFT) 50 MG tablet Take 1 tablet (50 mg total) by mouth at bedtime. 12/23/21  Yes Melvenia Beam, MD  VENTOLIN HFA 108 (90 Base) MCG/ACT inhaler INHALE 2 PUFFS INTO THE LUNGS EVERY 4 HOURS AS NEEDED FOR WHEEZING OR SHORTNESS OF BREATH 02/15/22  Yes Byrum, Rose Fillers, MD   Vibegron (GEMTESA) 75 MG TABS Take 1 tablet by mouth at bedtime.   Yes [provider]  amiodarone (PACERONE) 200 MG tablet Take 1 tablet (200 mg total) by mouth daily for 21 days. Patient not taking: Reported on 04/18/2022 03/19/22 04/09/22  Aline August, MD  predniSONE (DELTASONE) 10 MG tablet '20mg'$  daily for 5 days then '10mg'$  daily for 5 days then stop Patient not taking: Reported on 04/18/2022 03/19/22   Aline August, MD      Allergies    Levaquin [levofloxacin], Oxycodone, Sulfonamide derivatives, Sulfa antibiotics, and Morphine    Review of Systems   Review of Systems  Respiratory:  Positive for shortness of breath.     Physical Exam Updated Vital Signs BP (!) 124/109   Pulse (!) 135   Temp 97.7 F (36.5 C) (Oral)   Resp 19   Ht '5\' 3"'$  (1.6 m)   Wt 58 kg   LMP 02/22/1983 (Approximate)   SpO2 97%   BMI 22.65 kg/m  Physical Exam Constitutional: Alert and orientedx3, NAD , chronicall ill appearance, fatigued Eyes: Conjunctivae are normal. ENT      Head: Normocephalic and atraumatic.      Neck: No stridor. Cardiovascular: S1, S2,  regular rate, equal palpable radial and DP pulses, warm and well-perfused Respiratory: Normal respiratory effort. Breath sounds are decreased at left base.  O2 sat 100 on 3 L nasal cannula Gastrointestinal: Soft and nondistended Musculoskeletal:       Right lower leg: No tenderness or edema.      Left lower leg: No tenderness or edema. Neurologic: Normal speech and language.  Equal grip strength bilaterally.  Equal movement of the lower extremities equally weak 3/5 strength.  Sensation grossly intact.  No facial droop.  No gross focal neurologic deficits are appreciated. Skin: Skin is warm, dry and intact. No rash noted. Psychiatric: Mood and affect are normal. Speech and behavior are normal.  ED Results / Procedures / Treatments   Labs (all labs ordered are listed, but only abnormal results are displayed) Labs Reviewed  URINALYSIS,  ROUTINE W REFLEX MICROSCOPIC - Abnormal; Notable for the following components:      Result Value   Color, Urine AMBER (*)    Ketones, ur 5 (*)    All other components within normal limits  BLOOD GAS, VENOUS - Abnormal; Notable for the following components:   pO2, Ven <31 (*)    All other components within normal limits  COMPREHENSIVE METABOLIC PANEL - Abnormal; Notable for the following components:   Glucose, Bld 65 (*)    BUN 24 (*)    Calcium 7.9 (*)    Total Protein 5.3 (*)    Albumin 2.8 (*)    All other components within normal limits  CBC WITH DIFFERENTIAL/PLATELET - Abnormal; Notable for the following components:   WBC 14.8 (*)    RDW 18.2 (*)  Neutro Abs 11.5 (*)    Abs Immature Granulocytes 0.79 (*)    All other components within normal limits  BRAIN NATRIURETIC PEPTIDE - Abnormal; Notable for the following components:   B Natriuretic Peptide 290.4 (*)    All other components within normal limits  TROPONIN I (HIGH SENSITIVITY) - Abnormal; Notable for the following components:   Troponin I (High Sensitivity) 22 (*)    All other components within normal limits  RESP PANEL BY RT-PCR (RSV, FLU A&B, COVID)  RVPGX2  LIPASE, BLOOD  TSH  CBC  BASIC METABOLIC PANEL    EKG EKG Interpretation  Date/Time:  Monday April 18 2022 18:25:12 EST Ventricular Rate:  145 PR Interval:    QRS Duration: 114 QT Interval:  310 QTC Calculation: 482 R Axis:   -34 Text Interpretation: Atrial fibrillation with rapid V-rate Ventricular premature complex Incomplete left bundle branch block LVH with secondary repolarization abnormality ST depression, probably rate related So,o;ar tp (revopis Confirmed by Georgina Snell [phone]) on 04/18/2022 6:38:03 PM  Radiology CT Angio Chest PE W and/or Wo Contrast  Result Date: 04/18/2022 CLINICAL DATA:  Left-sided pleural effusion as well as abdominal distension. EXAM: CT ANGIOGRAPHY CHEST CT ABDOMEN AND PELVIS WITH CONTRAST TECHNIQUE:  Multidetector CT imaging of the chest was performed using the standard protocol during bolus administration of intravenous contrast. Multiplanar CT image reconstructions and MIPs were obtained to evaluate the vascular anatomy. Multidetector CT imaging of the abdomen and pelvis was performed using the standard protocol during bolus administration of intravenous contrast. RADIATION DOSE REDUCTION: This exam was performed according to the departmental dose-optimization program which includes automated exposure control, adjustment of the mA and/or kV according to patient size and/or use of iterative reconstruction technique. CONTRAST:  99m OMNIPAQUE IOHEXOL 350 MG/ML SOLN COMPARISON:  Chest CT dated 08/10/2021. CT abdomen pelvis dated 07/29/2019. FINDINGS: Evaluation is limited due to respiratory motion as well as due to streak artifact caused by patient's arms and left shoulder arthroplasty. CTA CHEST FINDINGS Cardiovascular: There is no cardiomegaly no pericardial effusion. Mild atherosclerotic calcification of the thoracic aorta. No aneurysmal dilatation or dissection. Evaluation of the pulmonary arteries is limited due to factors above. No pulmonary artery embolus identified. Mediastinum/Nodes: No hilar or mediastinal adenopathy. Small calcified granuloma. There is a small hiatal hernia. Mild thickened appearance of the distal esophagus may represent esophagitis. Clinical correlation is recommended. Ingested content noted within the esophagus which may represent delayed clearance for reflux. Underlying distal esophageal stricture or mass is not excluded. Endoscopy may provide better evaluation if clinically indicated. Lungs/Pleura: Small bilateral pleural effusions, left greater than right and increased since the prior CT. There is partial compressive atelectasis of the lower lobes. Pneumonia is less likely but not excluded. Right lung base linear atelectasis/scarring. There is no pneumothorax. There is a 3 mm  right middle lobe nodule or granuloma. The central airways are patent. Musculoskeletal: Total left shoulder arthroplasty. Osteopenia with degenerative changes of the spine. No acute osseous pathology. Review of the MIP images confirms the above findings. CT ABDOMEN and PELVIS FINDINGS No intra-abdominal free air or free fluid. Hepatobiliary: Several small liver cysts and additional subcentimeter hypodense lesions which are too small to characterize. No biliary ductal dilatation. The gallbladder is unremarkable Pancreas: Unremarkable. No pancreatic ductal dilatation or surrounding inflammatory changes. Spleen: Normal in size without focal abnormality. Adrenals/Urinary Tract: Mild thickened appearance of the medial limb of the right adrenal gland as well as an indeterminate subcentimeter left adrenal nodule similar to 2021, likely a  benign adenoma. Mild bilateral renal parenchyma atrophy. There is a 3 mm nonobstructing left renal inferior pole calculus. Small bilateral upper pole cysts. Several additional subcentimeter hypodense lesions are too small to characterize. There is no hydronephrosis on either side. There is symmetric enhancement and excretion of contrast by both kidneys. The visualized ureters appear unremarkable. The urinary bladder is distended and grossly unremarkable. Stomach/Bowel: Small hiatal hernia. There is moderate stool throughout the colon. Several small scattered sigmoid diverticula without active inflammatory changes. There is no bowel obstruction or active inflammation. The appendix is not visualized with certainty. No inflammatory changes identified in the right lower quadrant. Vascular/Lymphatic: Moderate aortoiliac atherosclerotic disease. The IVC is unremarkable. No portal venous gas. There is no adenopathy. Reproductive: The uterus is poorly visualized. A 2.1 cm left ovarian cyst. No follow-up imaging recommended. Note: This recommendation does not apply to premenarchal patients and to  those with increased risk (genetic, family history, elevated tumor markers or other high-risk factors) of ovarian cancer. Reference: JACR 2020 Feb; 17(2):248-254 Other: None Musculoskeletal: There is a total right hip arthroplasty and prior ORIF of left femoral neck fracture. Old healed bilateral pubic rami fractures. Osteopenia with degenerative changes of the spine. No acute osseous pathology. Review of the MIP images confirms the above findings. IMPRESSION: 1. No CT evidence of pulmonary artery embolus. 2. Small bilateral pleural effusions, left greater than right and increased since the prior CT. No acute intra-abdominal or pelvic pathology. 3. A 3 mm nonobstructing left renal inferior pole calculus. No hydronephrosis. 4. Colonic diverticulosis. No bowel obstruction. 5. Small hiatal hernia. Ingested content within the esophagus may represent delayed clearance for reflux. 6.  Aortic Atherosclerosis (ICD10-I70.0). Electronically Signed   By: Anner Crete M.D.   On: 04/18/2022 19:48   CT ABDOMEN PELVIS W CONTRAST  Result Date: 04/18/2022 CLINICAL DATA:  Left-sided pleural effusion as well as abdominal distension. EXAM: CT ANGIOGRAPHY CHEST CT ABDOMEN AND PELVIS WITH CONTRAST TECHNIQUE: Multidetector CT imaging of the chest was performed using the standard protocol during bolus administration of intravenous contrast. Multiplanar CT image reconstructions and MIPs were obtained to evaluate the vascular anatomy. Multidetector CT imaging of the abdomen and pelvis was performed using the standard protocol during bolus administration of intravenous contrast. RADIATION DOSE REDUCTION: This exam was performed according to the departmental dose-optimization program which includes automated exposure control, adjustment of the mA and/or kV according to patient size and/or use of iterative reconstruction technique. CONTRAST:  42m OMNIPAQUE IOHEXOL 350 MG/ML SOLN COMPARISON:  Chest CT dated 08/10/2021. CT abdomen pelvis  dated 07/29/2019. FINDINGS: Evaluation is limited due to respiratory motion as well as due to streak artifact caused by patient's arms and left shoulder arthroplasty. CTA CHEST FINDINGS Cardiovascular: There is no cardiomegaly no pericardial effusion. Mild atherosclerotic calcification of the thoracic aorta. No aneurysmal dilatation or dissection. Evaluation of the pulmonary arteries is limited due to factors above. No pulmonary artery embolus identified. Mediastinum/Nodes: No hilar or mediastinal adenopathy. Small calcified granuloma. There is a small hiatal hernia. Mild thickened appearance of the distal esophagus may represent esophagitis. Clinical correlation is recommended. Ingested content noted within the esophagus which may represent delayed clearance for reflux. Underlying distal esophageal stricture or mass is not excluded. Endoscopy may provide better evaluation if clinically indicated. Lungs/Pleura: Small bilateral pleural effusions, left greater than right and increased since the prior CT. There is partial compressive atelectasis of the lower lobes. Pneumonia is less likely but not excluded. Right lung base linear atelectasis/scarring. There is no pneumothorax.  There is a 3 mm right middle lobe nodule or granuloma. The central airways are patent. Musculoskeletal: Total left shoulder arthroplasty. Osteopenia with degenerative changes of the spine. No acute osseous pathology. Review of the MIP images confirms the above findings. CT ABDOMEN and PELVIS FINDINGS No intra-abdominal free air or free fluid. Hepatobiliary: Several small liver cysts and additional subcentimeter hypodense lesions which are too small to characterize. No biliary ductal dilatation. The gallbladder is unremarkable Pancreas: Unremarkable. No pancreatic ductal dilatation or surrounding inflammatory changes. Spleen: Normal in size without focal abnormality. Adrenals/Urinary Tract: Mild thickened appearance of the medial limb of the right  adrenal gland as well as an indeterminate subcentimeter left adrenal nodule similar to 2021, likely a benign adenoma. Mild bilateral renal parenchyma atrophy. There is a 3 mm nonobstructing left renal inferior pole calculus. Small bilateral upper pole cysts. Several additional subcentimeter hypodense lesions are too small to characterize. There is no hydronephrosis on either side. There is symmetric enhancement and excretion of contrast by both kidneys. The visualized ureters appear unremarkable. The urinary bladder is distended and grossly unremarkable. Stomach/Bowel: Small hiatal hernia. There is moderate stool throughout the colon. Several small scattered sigmoid diverticula without active inflammatory changes. There is no bowel obstruction or active inflammation. The appendix is not visualized with certainty. No inflammatory changes identified in the right lower quadrant. Vascular/Lymphatic: Moderate aortoiliac atherosclerotic disease. The IVC is unremarkable. No portal venous gas. There is no adenopathy. Reproductive: The uterus is poorly visualized. A 2.1 cm left ovarian cyst. No follow-up imaging recommended. Note: This recommendation does not apply to premenarchal patients and to those with increased risk (genetic, family history, elevated tumor markers or other high-risk factors) of ovarian cancer. Reference: JACR 2020 Feb; 17(2):248-254 Other: None Musculoskeletal: There is a total right hip arthroplasty and prior ORIF of left femoral neck fracture. Old healed bilateral pubic rami fractures. Osteopenia with degenerative changes of the spine. No acute osseous pathology. Review of the MIP images confirms the above findings. IMPRESSION: 1. No CT evidence of pulmonary artery embolus. 2. Small bilateral pleural effusions, left greater than right and increased since the prior CT. No acute intra-abdominal or pelvic pathology. 3. A 3 mm nonobstructing left renal inferior pole calculus. No hydronephrosis. 4.  Colonic diverticulosis. No bowel obstruction. 5. Small hiatal hernia. Ingested content within the esophagus may represent delayed clearance for reflux. 6.  Aortic Atherosclerosis (ICD10-I70.0). Electronically Signed   By: Anner Crete M.D.   On: 04/18/2022 19:48   CT Cervical Spine Wo Contrast  Result Date: 04/18/2022 CLINICAL DATA:  [place], weakness EXAM: CT CERVICAL SPINE WITHOUT CONTRAST TECHNIQUE: Multidetector CT imaging of the cervical spine was performed without intravenous contrast. Multiplanar CT image reconstructions were also generated. RADIATION DOSE REDUCTION: This exam was performed according to the departmental dose-optimization program which includes automated exposure control, adjustment of the mA and/or kV according to patient size and/or use of iterative reconstruction technique. COMPARISON:  None Available. FINDINGS: Alignment: There is mild degenerative retrolisthesis of C3 on C4 and C6 on C7. Otherwise alignment is grossly anatomic. 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 There are no acute or destructive bony abnormalities. Soft tissues and spinal canal: No prevertebral fluid or swelling. No visible canal hematoma. Disc levels: There is severe multilevel spondylosis and facet hypertrophy, greatest at C3-4, C4-5, and C6-7. Marked hypertrophic changes are seen at the C1-C2 interface. Upper chest: Airway is patent. Emphysematous changes are seen at the lung apices.  Mildly distended and fluid-filled proximal thoracic esophagus incidentally noted. Other: Reconstructed images demonstrate no additional findings. IMPRESSION: 1. No acute cervical spine fracture. 2. Extensive multilevel degenerative changes. Electronically Signed   By: Randa Ngo M.D.   On: 04/18/2022 19:37   CT Head Wo Contrast  Result Date: 04/18/2022 CLINICAL DATA:  [place], weakness EXAM: CT HEAD WITHOUT CONTRAST TECHNIQUE: Contiguous axial images were obtained  from the base of the skull through the vertex without intravenous contrast. RADIATION DOSE REDUCTION: This exam was performed according to the departmental dose-optimization program which includes automated exposure control, adjustment of the mA and/or kV according to patient size and/or use of iterative reconstruction technique. COMPARISON:  07/18/2019 FINDINGS: Brain: Chronic small vessel ischemic changes are seen throughout the periventricular and subcortical white matter, grossly stable. No evidence of acute infarct or hemorrhage. Lateral ventricles and midline structures are otherwise unremarkable. No acute extra-axial fluid collections. No mass effect. Vascular: No hyperdense vessel or unexpected calcification. Skull: Normal. Negative for fracture or focal lesion. Sinuses/Orbits: Retained secretions within the sphenoid and right maxillary sinuses, with superimposed gas fluid levels. Remaining paranasal sinuses are clear. Other: None. IMPRESSION: 1. No acute intracranial process. 2. Right maxillary and sphenoid sinus disease. Electronically Signed   By: Randa Ngo M.D.   On: 04/18/2022 19:35   DG Chest 2 View  Result Date: 04/18/2022 CLINICAL DATA:  Short of breath and fatigue EXAM: CHEST - 2 VIEW COMPARISON:  Radiograph 03/14/2022 FINDINGS: Normal cardiac silhouette small LEFT effusion noted. Hyperinflated lungs. no focal consolidation. No pneumothorax. Degenerative change of the RIGHT shoulder. Arthroplasty LEFT shoulder. Healed rib fractures on the LEFT. IMPRESSION: 1. New small to moderate LEFT pleural effusion. 2. Hyperinflated lungs without evidence of pneumonia. Electronically Signed   By: Suzy Bouchard M.D.   On: 04/18/2022 16:49    Procedures .Critical Care  Performed by: Elgie Congo, MD Authorized by: Elgie Congo, MD   Critical care provider statement:    Critical care time (minutes):  50   Critical care was necessary to treat or prevent imminent or life-threatening  deterioration of the following conditions:  Cardiac failure   Critical care was time spent personally by me on the following activities:  Development of treatment plan with patient or surrogate, discussions with consultants, evaluation of patient's response to treatment, examination of patient, ordering and review of laboratory studies, ordering and review of radiographic studies, ordering and performing treatments and interventions, pulse oximetry, re-evaluation of patient's condition, review of old charts and obtaining history from patient or surrogate   Care discussed with: admitting provider     Medications Ordered in ED Medications  amiodarone (NEXTERONE) 1.8 mg/mL load via infusion 150 mg (150 mg Intravenous Bolus from Bag 04/18/22 1847)    Followed by  amiodarone (NEXTERONE PREMIX) 360-4.14 MG/200ML-% (1.8 mg/mL) IV infusion (60 mg/hr Intravenous New Bag/Given 04/18/22 1900)    Followed by  amiodarone (NEXTERONE PREMIX) 360-4.14 MG/200ML-% (1.8 mg/mL) IV infusion (has no administration in time range)  feeding supplement (ENSURE ENLIVE / ENSURE PLUS) liquid 237 mL (has no administration in time range)  ipratropium-albuterol (DUONEB) 0.5-2.5 (3) MG/3ML nebulizer solution 3 mL (3 mLs Nebulization Given 04/18/22 1702)  famotidine (PEPCID) IVPB 20 mg premix (0 mg Intravenous Stopped 04/18/22 1828)  alum & mag hydroxide-simeth (MAALOX/MYLANTA) 200-200-20 MG/5ML suspension 30 mL (30 mLs Oral Given 04/18/22 1742)  lactated ringers bolus 1,000 mL (0 mLs Intravenous Stopped 04/18/22 2219)  iohexol (OMNIPAQUE) 350 MG/ML injection 80 mL (80 mLs Intravenous Contrast  Given 04/18/22 1904)    ED Course/ Medical Decision Making/ A&P Clinical Course as of 04/18/22 2357  Mon Apr 18, 2022  1836 Called to patient's room, currently in A-fib RVR with rates up to 160s.  EKG obtained with T wave inversions lateral leads.  Likely some rate dependent inferiorly depressions.  Patient without chest pain actively.  Patient  currently on amiodarone for A-fib RVR, ordered for IV load and infusion. [VB]  [id number] Patient's heart rate in the 120s to 130s now with stable MAP greater than 65.  She has finished amnio load's plan to start infusion will reach out to cardiology. [VB]  2016 Spoke with Dr. Chancy Milroy of cardiology recommending continuing amiodarone infusion.  120s acceptable heart rate and believes that will come down with amnioinfusion.  Recommends not starting any of her blood pressure medications at this time.  She can be seen inpatient by cardiology. [VB]  2058 Discussed case with Dr. Flossie Buffy who is excepted patient for admission for A-fib RVR"=. [VB]    Clinical Course User Index [VB] Elgie Congo, MD                            Medical Decision Making [name] is a 88 y.o. female. With pmh A-fib on Eliquis, PSVT, pulmonary sarcoid, scoliosis, HLD, HTN, COPD, bronchiectasis, CAD, diastolic CHF, dementia discharged 03/18/22 for COPD exacerbation 2/2 RSV presenting with shortness of breath, generalized fatigue, decreased appetite for the past few days.  She is on 3 L nasal cannula at baseline.  Patient presents hemodynamically stable satting 100% on baseline home 3 L nasal cannula.  She has no pitting edema, JVD or rales suggestive of fluid overload.  She has no increased work of breathing, wheezing, excessive cough or other symptoms suggestive of COPD exacerbation.  Patient appears to be at baseline complaining of shortness of breath which appears to be chronic.  However with some history of mild dementia will further evaluate for any evidence of pneumonia, viral infection, acute electrolyte abnormality, anemia, dehydration, UTI, thyroid abnormalities that could be contributing to presentation.  She has no focal neurologic deficits concerning for CVA. Due to recent fall. Obtaining head Ct to rule out ICH.   Called to patient's room, currently in A-fib RVR with rates up to 160s.  EKG obtained with T wave  inversions lateral leads.  Likely some rate dependent inferiorly depressions.  Patient without chest pain actively.  Patient currently on amiodarone for A-fib RVR, ordered for IV load and infusion. [VB]  High sensitive troponin 22 with no acute changes on EKG from previous doubt ACS.  TSH within normal limits.  CTA PE study with bilateral pleural effusions no PE no evidence of infection.  CTAP with IV contrast unremarkable.  CT head and C-spine no acute abnormalities or injury.  Patient's heart rate in the 120s to 130s now with stable MAP greater than 65.  She has finished amnio load's plan to start infusion will reach out to cardiology. [VB]  Spoke with Dr. Chancy Milroy of cardiology recommending continuing amiodarone infusion.  120s acceptable heart rate and believes that will come down with amnioinfusion.  Recommends not starting any of her blood pressure medications at this time.  She can be seen inpatient by cardiology. [VB] Discussed case with Dr. Flossie Buffy who accepted patient for admission for A-fib RVR"=. [VB]     Amount and/or Complexity of Data Reviewed Labs: ordered. Radiology: ordered.  Risk OTC drugs. Prescription  drug management. Decision regarding hospitalization.    Final Clinical Impression(s) / ED Diagnoses Final diagnoses:  Dyspnea, unspecified type  Pleural effusion on left  Atrial fibrillation with RVR [facility])    Rx / DC Orders ED Discharge Orders     None         Elgie Congo, MD 04/18/22 2357

## 2022-04-18 NOTE — Assessment & Plan Note (Addendum)
Stable at baseline

## 2022-04-19 ENCOUNTER — Inpatient Hospital Stay (HOSPITAL_COMMUNITY): Payer: Medicare Other

## 2022-04-19 DIAGNOSIS — Z7189 Other specified counseling: Secondary | ICD-10-CM | POA: Diagnosis not present

## 2022-04-19 DIAGNOSIS — J441 Chronic obstructive pulmonary disease with (acute) exacerbation: Secondary | ICD-10-CM | POA: Diagnosis present

## 2022-04-19 DIAGNOSIS — J90 Pleural effusion, not elsewhere classified: Secondary | ICD-10-CM | POA: Diagnosis not present

## 2022-04-19 DIAGNOSIS — R627 Adult failure to thrive: Secondary | ICD-10-CM | POA: Diagnosis present

## 2022-04-19 DIAGNOSIS — J9611 Chronic respiratory failure with hypoxia: Secondary | ICD-10-CM | POA: Diagnosis not present

## 2022-04-19 DIAGNOSIS — Z8249 Family history of ischemic heart disease and other diseases of the circulatory system: Secondary | ICD-10-CM | POA: Diagnosis not present

## 2022-04-19 DIAGNOSIS — F0394 Unspecified dementia, unspecified severity, with anxiety: Secondary | ICD-10-CM | POA: Diagnosis present

## 2022-04-19 DIAGNOSIS — Z7951 Long term (current) use of inhaled steroids: Secondary | ICD-10-CM | POA: Diagnosis not present

## 2022-04-19 DIAGNOSIS — R54 Age-related physical debility: Secondary | ICD-10-CM | POA: Diagnosis present

## 2022-04-19 DIAGNOSIS — I4891 Unspecified atrial fibrillation: Secondary | ICD-10-CM | POA: Diagnosis present

## 2022-04-19 DIAGNOSIS — E785 Hyperlipidemia, unspecified: Secondary | ICD-10-CM | POA: Diagnosis present

## 2022-04-19 DIAGNOSIS — L89153 Pressure ulcer of sacral region, stage 3: Secondary | ICD-10-CM | POA: Diagnosis present

## 2022-04-19 DIAGNOSIS — I480 Paroxysmal atrial fibrillation: Secondary | ICD-10-CM | POA: Diagnosis not present

## 2022-04-19 DIAGNOSIS — I5032 Chronic diastolic (congestive) heart failure: Secondary | ICD-10-CM | POA: Diagnosis not present

## 2022-04-19 DIAGNOSIS — E440 Moderate protein-calorie malnutrition: Secondary | ICD-10-CM | POA: Diagnosis present

## 2022-04-19 DIAGNOSIS — R933 Abnormal findings on diagnostic imaging of other parts of digestive tract: Secondary | ICD-10-CM | POA: Diagnosis not present

## 2022-04-19 DIAGNOSIS — Z515 Encounter for palliative care: Secondary | ICD-10-CM | POA: Diagnosis not present

## 2022-04-19 DIAGNOSIS — I959 Hypotension, unspecified: Secondary | ICD-10-CM | POA: Diagnosis present

## 2022-04-19 DIAGNOSIS — D649 Anemia, unspecified: Secondary | ICD-10-CM | POA: Diagnosis present

## 2022-04-19 DIAGNOSIS — K222 Esophageal obstruction: Secondary | ICD-10-CM | POA: Diagnosis present

## 2022-04-19 DIAGNOSIS — E860 Dehydration: Secondary | ICD-10-CM | POA: Diagnosis present

## 2022-04-19 DIAGNOSIS — I2489 Other forms of acute ischemic heart disease: Secondary | ICD-10-CM | POA: Diagnosis present

## 2022-04-19 DIAGNOSIS — K219 Gastro-esophageal reflux disease without esophagitis: Secondary | ICD-10-CM | POA: Diagnosis not present

## 2022-04-19 DIAGNOSIS — E876 Hypokalemia: Secondary | ICD-10-CM | POA: Diagnosis present

## 2022-04-19 DIAGNOSIS — Z7901 Long term (current) use of anticoagulants: Secondary | ICD-10-CM | POA: Diagnosis not present

## 2022-04-19 DIAGNOSIS — J449 Chronic obstructive pulmonary disease, unspecified: Secondary | ICD-10-CM | POA: Diagnosis not present

## 2022-04-19 DIAGNOSIS — I110 Hypertensive heart disease with heart failure: Secondary | ICD-10-CM | POA: Diagnosis present

## 2022-04-19 DIAGNOSIS — R338 Other retention of urine: Secondary | ICD-10-CM | POA: Diagnosis not present

## 2022-04-19 DIAGNOSIS — Z1152 Encounter for screening for COVID-19: Secondary | ICD-10-CM | POA: Diagnosis not present

## 2022-04-19 DIAGNOSIS — Z79899 Other long term (current) drug therapy: Secondary | ICD-10-CM | POA: Diagnosis not present

## 2022-04-19 DIAGNOSIS — Z66 Do not resuscitate: Secondary | ICD-10-CM | POA: Diagnosis present

## 2022-04-19 LAB — BASIC METABOLIC PANEL
Anion gap: 8 (ref 5–15)
BUN: 23 mg/dL (ref 8–23)
CO2: 24 mmol/L (ref 22–32)
Calcium: 7.7 mg/dL — ABNORMAL LOW (ref 8.9–10.3)
Chloride: 103 mmol/L (ref 98–111)
Creatinine, Ser: 0.83 mg/dL (ref 0.44–1.00)
GFR, Estimated: >60 mL/min (ref >60)
Glucose, Bld: 99 mg/dL (ref 70–99)
Potassium: 3.5 mmol/L (ref 3.5–5.1)
Sodium: 135 mmol/L (ref 135–145)

## 2022-04-19 LAB — CBC
HCT: 39.4 % (ref 36.0–46.0)
Hemoglobin: 12.1 g/dL (ref 12.0–15.0)
MCH: 28 pg (ref 26.0–34.0)
MCHC: 30.7 g/dL (ref 30.0–36.0)
MCV: 91.2 fL (ref 80.0–100.0)
Platelets: 233 10*3/uL (ref 150–400)
RBC: 4.32 MIL/uL (ref 3.87–5.11)
RDW: 18.1 % — ABNORMAL HIGH (ref 11.5–15.5)
WBC: 14.9 10*3/uL — ABNORMAL HIGH (ref 4.0–10.5)
nRBC: 0 % (ref 0.0–0.2)

## 2022-04-19 LAB — MRSA NEXT GEN BY PCR, NASAL: MRSA by PCR Next Gen: NOT DETECTED

## 2022-04-19 MED ORDER — PANTOPRAZOLE SODIUM 40 MG PO TBEC
40.0000 mg | DELAYED_RELEASE_TABLET | Freq: Every day | ORAL | Status: DC
  Administered 2022-04-19: 40 mg via ORAL
  Filled 2022-04-19: qty 1

## 2022-04-19 MED ORDER — OXYBUTYNIN CHLORIDE 5 MG PO TABS
5.0000 mg | ORAL_TABLET | Freq: Every day | ORAL | Status: DC
  Administered 2022-04-19 (×2): 5 mg via ORAL
  Filled 2022-04-19 (×2): qty 1

## 2022-04-19 MED ORDER — CHLORHEXIDINE GLUCONATE CLOTH 2 % EX PADS
6.0000 | MEDICATED_PAD | Freq: Every day | CUTANEOUS | Status: DC
  Administered 2022-04-19 – 2022-04-21 (×3): 6 via TOPICAL

## 2022-04-19 MED ORDER — PANTOPRAZOLE SODIUM 40 MG IV SOLR
40.0000 mg | INTRAVENOUS | Status: DC
  Administered 2022-04-19: 40 mg via INTRAVENOUS
  Filled 2022-04-19: qty 10

## 2022-04-19 MED ORDER — BUDESON-GLYCOPYRROL-FORMOTEROL 160-9-4.8 MCG/ACT IN AERO: 2.0000 | INHALATION_SPRAY | Freq: Two times a day (BID) | RESPIRATORY_TRACT | Status: DC

## 2022-04-19 MED ORDER — APIXABAN 2.5 MG PO TABS
2.5000 mg | ORAL_TABLET | Freq: Two times a day (BID) | ORAL | Status: DC
  Administered 2022-04-19 – 2022-04-24 (×12): 2.5 mg via ORAL
  Filled 2022-04-19 (×12): qty 1

## 2022-04-19 MED ORDER — MOMETASONE FURO-FORMOTEROL FUM 100-5 MCG/ACT IN AERO
2.0000 | INHALATION_SPRAY | Freq: Two times a day (BID) | RESPIRATORY_TRACT | Status: DC
  Administered 2022-04-20 – 2022-04-24 (×9): 2 via RESPIRATORY_TRACT
  Filled 2022-04-19 (×2): qty 8.8

## 2022-04-19 MED ORDER — UMECLIDINIUM BROMIDE 62.5 MCG/ACT IN AEPB
1.0000 | INHALATION_SPRAY | Freq: Every day | RESPIRATORY_TRACT | Status: DC
  Administered 2022-04-20 – 2022-04-24 (×5): 1 via RESPIRATORY_TRACT
  Filled 2022-04-19 (×2): qty 7

## 2022-04-19 MED ORDER — ACETAMINOPHEN 325 MG PO TABS: 650.0000 mg | ORAL_TABLET | Freq: Four times a day (QID) | ORAL | Status: DC | PRN

## 2022-04-19 MED ORDER — SERTRALINE HCL 50 MG PO TABS
75.0000 mg | ORAL_TABLET | Freq: Every day | ORAL | Status: DC
  Administered 2022-04-19 – 2022-04-24 (×6): 75 mg via ORAL
  Filled 2022-04-19: qty 2
  Filled 2022-04-19 (×6): qty 1

## 2022-04-19 MED ORDER — LACTATED RINGERS IV BOLUS
250.0000 mL | Freq: Once | INTRAVENOUS | Status: AC
  Administered 2022-04-19: 250 mL via INTRAVENOUS

## 2022-04-19 MED ORDER — TAMSULOSIN HCL 0.4 MG PO CAPS
0.4000 mg | ORAL_CAPSULE | Freq: Every day | ORAL | Status: DC
  Administered 2022-04-19 – 2022-04-24 (×6): 0.4 mg via ORAL
  Filled 2022-04-19 (×6): qty 1

## 2022-04-19 MED ORDER — AMIODARONE HCL 200 MG PO TABS
200.0000 mg | ORAL_TABLET | Freq: Two times a day (BID) | ORAL | Status: DC
  Administered 2022-04-19 – 2022-04-24 (×11): 200 mg via ORAL
  Filled 2022-04-19 (×12): qty 1

## 2022-04-19 MED ORDER — ORAL CARE MOUTH RINSE: 15.0000 mL | OROMUCOSAL | Status: DC | PRN

## 2022-04-19 MED ORDER — ASPIRIN 81 MG PO CHEW: 81.0000 mg | CHEWABLE_TABLET | Freq: Every day | ORAL | Status: DC

## 2022-04-19 MED ORDER — IPRATROPIUM-ALBUTEROL 0.5-2.5 (3) MG/3ML IN SOLN: 3.0000 mL | Freq: Four times a day (QID) | RESPIRATORY_TRACT | Status: DC | PRN

## 2022-04-19 NOTE — Progress Notes (Signed)
Pt arrived from ED on stretcher. Slid to bed w/ 4 assist. VSS on RA. Pt oriented to place and person. Follows commands, pleasantly confused. Dtr bedside. Pt and dtr oriented to callbell and environment. POC discussed. Foley with amber urine draining.

## 2022-04-19 NOTE — Consult Note (Addendum)
Cardiology Consultation   Patient ID: ZIQI REIMER MRN: [id number]; DOB: 05-14-1934  Admit date: 04/18/2022 Date of Consult: 04/19/2022  PCP:  [facility], [facility] Cardiologist:  Sherren Mocha, MD  Cardiology APP:  Liliane Shi, PA-C  {  Patient Profile:   [name] is a 88 y.o. female with a history of non-obstructive CAD noted on cardiac catheterization in 07/2021, chronic HFpEF, paroxysmal atrial fibrillation on Eliquis, SVT, mild carotid stenosis noted on dopplers in 06/2018, aortic atherosclerosis, hypertension, hyperlipidemia, pulmonary sarcoidosis, bronchiectasis, and GERD who is being seen 04/19/2022 for the evaluation of atrial fibrillation at the request of Dr. Tawanna Solo.  History of Present Illness:   [name] is a 88 year old female with the above history who is followed by Dr. Burt Knack. Patient has a history of non-obstructive CAD. She was admitted in 07/2021 with chest pain and mildly elevated troponin. Echo showed normal LV function and grade 2 diastolic dysfunction. LHC at that time showed 40% stenosis of LAD, 60-70% stenosis of ostial LCX, and mild non-obstructive plaquing in the RCA. There was no significant changes compared to prior cath in 2017 and continued medical therapy was recommended. She also has known HFpEF and atrial fibrillation. She was last seen by [name], PA-C in 08/2021 at which time she was doing well from a cardiac standpoint with no recurrent chest pain.  Patient was recently admitted from 03/09/2022 to 03/18/2022 for COPD exacerbation and RSV after presenting with worsening shortness of breath. She was treated with steroids. Hospitalization was complicated by atrial fibrillation with RVR. Echo showed LVEF of 60-65% with no regional wall motion abnormalities and grade 2 diastolic dysfunction. She was started on Amiodarone given soft BP difficult controlling heart rates. Plan was for a short term course of Amiodarone.  She was discharged on Amiodarone '200mg'$  twice daily for 7 days with plans to then continue '200mg'$  once daily for 21 days and the stop.  Patient presented back to the ED on 04/18/2022 for increased shortness of breath, fatigue and decreased appetite. Upon arrival to the ED,  patient was tachycardic but vitals stable. EKG showed atrial fibrillation, rate 145 bpm, with diffuse ST depressions (likely rate related). High-sensitivity troponin 22. BNP mildly elevated at 290. Chest x-ray showed new small to moderate left pleural effusion and hyperinflated lungs. Chest CTA negative for PE but confirmed small bilateral pleural effusions as well as a small hiatal hernia.  WBC 14.8, Hgb 13.2, Plts 256. Na 138, K 3.7, Glucose 65, BUN 24, Cr 0.87. Total Protein 5.3, Albumin 2.8, AST 19, ALT 13, Alk Phos 59. Total Bili 1.1. Respiratory panel negative for COVID, influenza, and RSV.  At the time of this evaluation, patient is resting comfortably in no acute distress. Patient is alert and oriented x4 but does appear mildly confused. She has been at a SNF but states she has been at home with her daughter and son-in-law. No family is currently at bedside. She was able to tell me that it is worsening shortness of breath that brought her back to the hospital. However, per review of H&P (when daughter was presented and assisted with history), it sounds like she has also had significant weakness, fatigue, and deconditioning since leaving the hospital. There is also mention of her being treated for aspiration pneumonia the SNF. Daughter also told admitting MD that she has had a low appetite and early satiety. Patient does report chronic dyspnea on exertion to me but states  she thinks this has been stable. She denied any orthopnea, PND, or edema. She denied any chest pain. She states she "seldom" has palpitations with her atrial fibrillation and has not had any recently. She does report occasional dizziness but she is unable to elaborate  much on this. She is not sure if this is with position changes or not. She denies any falls or syncope. She denies any fevers, cough, nasal congestion, nausea, vomiting, or diarrhea.   Past Medical History:  Diagnosis Date   Acute maxillary sinusitis 10/17/2017   Acute posthemorrhagic anemia 08/22/2012   Acute renal failure syndrome ([place]) 01/04/2019   Allergy    SEASONAL   Anemia    Arthritis    Asthma    "slight"    Baker's cyst of knee, right    CAD (coronary artery disease)    a. Myoview 10/15 - normal EF 70% // Myoview 11/16: EF 75%, normal perfusion, low risk // c. LHC 3/17 - LAD irregs, oLCx 70 (neg FFR), mRCA 30, EF 55-65% >> med Rx // Myoview 07/2018:  EF 73, extracardiac uptake, no significant ischemia (reviewed with Dr. Burt Knack), Low Risk // Myoview 6/22: EF 72, normal perfusion, low risk   Carotid stenosis    a. Carotid US 9/15 - Bilateral 1-39% ICA >> FU 2 years // b. Bilateral ICA 1-39 >> FU prn // Carotid US 06/2018: bilat 1-39; fu prn   Closed fracture of right olecranon process 05/09/2017   Closed left subtrochanteric femur fracture S/P Open/closed and reduction, internal medullary fixation  09/05/2012   Dyspnea    due to Sarcoidosis. When is windy or humed   Dysrhythmia    fast heart rate   Edema 10/30/2019   Elbow fracture 04/2017   Right, had surgery   Esophageal reflux    Fracture of inferior pubic ramus ([place]) 02/28/2019   Hematochezia    Hip fracture ([place]) 12/12/2018   History of echocardiogram    a. Echo 10/15 - EF 60-65%, no RWMA   HLD (hyperlipidemia)    Hx of colonoscopy    Hydronephrosis 07/29/2019   Left foot pain 09/02/2014   Assessment: 88 year old with acquired leg length discrepancy on the left who presents with 10 days of acute onset left foot pain. She seemed to be doing well prior to this pain with her orthotics that had been made to correct her leg length discrepancy. Negative x-rays at outside office which were again reviewed today.   This pain  does not seem to impair her function and which has actually been im   Macular pucker, right eye    will have removed on 01/26/21   Osteoporosis    Other chronic pulmonary heart diseases    Paroxysmal supraventricular tachycardia    Pneumonia    Pneumonia 09/18/2019   Pre-op evaluation 06/11/2019   Pyelonephritis 07/29/2019   Sacral insufficiency fracture 12/12/2018   Sarcoidosis    Sepsis ([place]) 07/30/2019   Sepsis due to Escherichia coli (e. 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   COLONOSCOPY  CYSTOSCOPY W/ RETROGRADES Left 07/30/2019   Procedure: CYSTOSCOPY WITH RETROGRADE PYELOGRAM LEFT STENT PLACEMENT;  Surgeon: Ardis Hughs, MD;  Location: [facility];  Service: Urology;  Laterality: Left;   CYSTOSCOPY WITH RETROGRADE PYELOGRAM, URETEROSCOPY AND STENT PLACEMENT Left 08/20/2019   Procedure: CYSTOSCOPY, URETEROSCOPY AND STENT PLACEMENT;  Surgeon: Robley Fries, MD;  Location: [facility];  Service: Urology;  Laterality: Left;  1 HR   FOOT SURGERY Right    HIP SURGERY Right 2011   full replacement   HOLMIUM LASER APPLICATION Left [id number]   Procedure: HOLMIUM LASER APPLICATION;  Surgeon: Robley Fries, MD;  Location: [facility];  Service: Urology;  Laterality: Left;   LEFT HEART CATH AND CORONARY ANGIOGRAPHY N/A 08/09/2021   Procedure: LEFT HEART CATH AND CORONARY ANGIOGRAPHY;  Surgeon: Sherren Mocha, MD;  Location: [facility];  Service: Cardiovascular;  Laterality: N/A;   LEG SURGERY Left 06/2012   femur fracture s/p open and closed  reduction in [place], Dr. Jimmye Norman   lens replacement Right    right eye   ORIF ELBOW FRACTURE Right 05/09/2017   Procedure: ORIF right olecranon fracture with repair/reconstruction, ulnar nerve transposition as needed;  Surgeon: Roseanne Kaufman, MD;  Location: [facility];  Service: Orthopedics;  Laterality: Right;  Requests 90 mins   pelvis fracture     POLYPECTOMY     REVERSE SHOULDER ARTHROPLASTY Left 06/13/2019   Procedure: REVERSE SHOULDER ARTHROPLASTY;  Surgeon: Justice Britain, MD;  Location: [facility];  Service: Orthopedics;  Laterality: Left;  186mn   SHOULDER ARTHROSCOPY W/ ROTATOR CUFF REPAIR Right    TRAPEZIUM RESECTION Right      Home Medications:  Prior to Admission medications   Medication Sig Start Date End Date Taking? Authorizing Provider  acetaminophen (TYLENOL) 500 MG tablet Take 1,000 mg by mouth every 6 (six) hours as needed for mild pain.    Yes [provider]  aspirin 81 MG chewable tablet Chew 81 mg by mouth daily.   Yes [provider]  Budeson-Glycopyrrol-Formoterol (BREZTRI AEROSPHERE) 160-9-4.8 MCG/ACT AERO Inhale 2 puffs into the lungs in the morning and at bedtime. 09/29/21  Yes BCollene Gobble MD  Calcium Carbonate Antacid (TUMS ULTRA 1000 PO) Take 2,000-3,000 mg by mouth at bedtime as needed (acid reflux/indigestion/heartburn).   Yes [provider]  ELIQUIS 2.5 MG TABS tablet TAKE 1 TABLET(2.5 MG) BY MOUTH TWICE DAILY Patient taking differently: Take 2.5 mg by mouth 2 (two) times daily. 01/31/22  Yes CSherren Mocha MD  guaiFENesin (MUCINEX) 600 MG 12 hr tablet Take 2 tablets (1,200 mg total) by mouth 2 (two) times daily. 03/18/22  Yes AAline August MD  ipratropium-albuterol (DUONEB) 0.5-2.5 (3) MG/3ML SOLN Take 3 mLs by nebulization every 6 (six) hours as needed (wheezing; shortness of breath). 10/22/21  Yes Cobb, KKarie Schwalbe NP  lisinopril (ZESTRIL) 20 MG tablet Take 1 tablet (20 mg total) by mouth daily. 03/19/22  Yes AAline August MD   metoprolol tartrate (LOPRESSOR) 25 MG tablet Take 1 tablet (25 mg total) by mouth daily as needed (palpitations). 08/23/21  Yes Weaver, [name] T, PA-C  nitroGLYCERIN (NITROSTAT) 0.4 MG SL tablet Place 1 tablet (0.4 mg total) under the tongue every 5 (five) minutes as needed for chest pain. 11/06/19  Yes Weaver, [name] T, PA-C  omeprazole (PRILOSEC) 40 MG capsule Take 40 mg by mouth in the morning and at bedtime.   Yes [provider]  oxybutynin (DITROPAN) 5 MG tablet Take 5 mg by mouth at bedtime. 08/25/20  Yes [provider]  polyethylene glycol (MIRALAX / GLYCOLAX) 17 g packet Take 17 g by mouth daily.   Yes [provider]  sertraline (ZOLOFT) 25 MG tablet Take 25 mg by mouth daily.   Yes [provider]  sertraline (ZOLOFT) 50 MG tablet Take 1 tablet (50 mg total) by mouth at bedtime. 12/23/21  Yes Melvenia Beam, MD  VENTOLIN HFA 108 (90 Base) MCG/ACT inhaler INHALE 2 PUFFS INTO THE LUNGS EVERY 4 HOURS AS NEEDED FOR WHEEZING OR SHORTNESS OF BREATH 02/15/22  Yes Byrum, Rose Fillers, MD  Vibegron (GEMTESA) 75 MG TABS Take 1 tablet by mouth at bedtime.   Yes [provider]  amiodarone (PACERONE) 200 MG tablet Take 1 tablet (200 mg total) by mouth daily for 21 days. Patient not taking: Reported on 04/18/2022 03/19/22 04/09/22  Aline August, MD  predniSONE (DELTASONE) 10 MG tablet '20mg'$  daily for 5 days then '10mg'$  daily for 5 days then stop Patient not taking: Reported on 04/18/2022 03/19/22   Aline August, MD    Inpatient Medications: Scheduled Meds:  apixaban  2.5 mg Oral BID   aspirin  81 mg Oral Daily   feeding supplement  237 mL Oral BID BM   mometasone-formoterol  2 puff Inhalation BID   And   umeclidinium bromide  1 puff Inhalation Daily   oxybutynin  5 mg Oral QHS   pantoprazole  40 mg Oral Daily   sertraline  75 mg Oral Daily   tamsulosin  0.4 mg Oral Daily   Continuous Infusions:  amiodarone Stopped (04/19/22 0551)   PRN  Meds: acetaminophen, ipratropium-albuterol  Allergies:    Allergies  Allergen Reactions   Levaquin [Levofloxacin] Other (See Comments)    Joint pain.. Per doctor not to take again   Oxycodone Nausea Only    Can take oxycodone with Zofran Other reaction(s): vomiting   Sulfonamide Derivatives Other (See Comments)    Possibly caused hepatitis   Sulfa Antibiotics Other (See Comments)    Possibly caused hepatitis   Morphine Nausea Only    Social History:   Social History   Socioeconomic History   Marital status: Widowed    Spouse name: Not on file   Number of children: 1   Years of education: Not on file   Highest education level: Some college, no degree  Occupational History   Occupation: retired    Comment: still works as Scientist, 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  Tobacco Use   Smoking status: Never   Smokeless tobacco: Never  Vaping Use   Vaping Use: Never used  Substance and Sexual Activity   Alcohol use: No   Drug use: No   Sexual activity: Not Currently    Partners: Male    Birth control/protection: Post-menopausal  Other Topics Concern   Not on file  Social History Narrative   Lives with daughter and son in law   decaffeinated  use:  Coffee 1 per day   Right handed   Social Determinants of Health   Financial Resource Strain: Not on file  Food Insecurity: Not on file  Transportation Needs: Not on file  Physical Activity: Not on file  Stress: Not on file  Social Connections: Not on file  Intimate Partner Violence: Not on file    Family History:   Family History  Problem Relation Age of Onset   Colon cancer Mother 59   Cancer Mother    Lung cancer Father    Heart disease Father  Arrhythmia Father    Cancer Father    Cancer Sister        ovarian   Heart attack Brother    Esophageal cancer Neg Hx    Rectal cancer Neg Hx    Stomach cancer Neg Hx    Stroke Neg Hx      ROS:  Please see the history of present illness.  All other ROS reviewed and  negative.     Physical Exam/Data:   Vitals:   04/19/22 0545 04/19/22 0600 04/19/22 0609 04/19/22 0715  BP:  109/76    Pulse: 72 72  70  Resp: '20 15  19  '$ Temp:   97.6 F (36.4 C)   TempSrc:   Oral   SpO2: 100% 98%  96%  Weight:      Height:        Intake/Output Summary (Last 24 hours) at 04/19/2022 0902 Last data filed at 04/19/2022 0634 Gross per 24 hour  Intake 466.61 ml  Output 1400 ml  Net -933.39 ml      04/18/2022    4:05 PM 03/18/2022    5:00 AM 03/17/2022    5:00 AM  Last 3 Weights  Weight (lbs) 127 lb 13.9 oz 129 lb 3 oz 126 lb 15.8 oz  Weight (kg) 58 kg 58.6 kg 57.6 kg     Body mass index is 22.65 kg/m.  General: 88 y.o. thin Caucasian female resting comfortably in no acute distress. HEENT: Normocephalic and atraumatic. Sclera clear.  Neck: Supple. No JVD. Heart: RRR. Distinct S1 and S2. No murmurs, gallops, or rubs. Radial and distal pedal pulses 2+ and equal bilaterally. Lungs: No increased work of breathing. Decreased breaths sounds in bilateral bases (left > right) with faint crackles. A few very faint expiratory wheezes also noted.  Abdomen: Soft, non-distended, and non-tender to palpation.  MSK: Generalized weakness Extremities: No lower extremity edema.   Skin: Warm and dry. Neuro: Alert and oriented x4. No focal deficits. Psych: Normal affect.   EKG:  The EKG was personally reviewed and demonstrates:  Atrial fibrillation, rate 145 bpm, with diffuse ST depressions (likely rate related). Telemetry:  Telemetry was personally reviewed and demonstrates:  Paroxysmal atrial fibrillation with rates as high as the 140s when in atrial fibrillation. Currently has been in sinus rhythm with rates in the the 70s for the last couple of hours. Postconversion pauses noted with the longest one be around 2.45 seconds.   Relevant CV Studies:  Left Cardiac Catheterization 08/09/2021: 1.  Mild nonobstructive plaquing in the RCA (dominant vessel), unchanged from previous  study 2.  Patent left main with no significant stenosis 3.  Patent LAD with mild proximal vessel stenosis of 40% 4.  Moderate ostial circumflex stenosis estimated at 60 to 70%, unchanged from the previous study.   The patient has stable coronary anatomy from her prior cardiac catheterization study in 2017.  Recommend continued medical therapy.  Okay to resume apixaban 2.5 mg twice daily tomorrow morning.  Diagnostic Dominance: Right     _______________  Echocardiogram 03/10/2022: Impressions:  1. Left ventricular ejection fraction, by estimation, is 60 to 65%. The  left ventricle has normal function. The left ventricle has no regional  wall motion abnormalities. There is mild concentric left ventricular  hypertrophy. Left ventricular diastolic  parameters are consistent with Grade II diastolic dysfunction  (pseudonormalization).   2. Right ventricular systolic function is normal. The right ventricular  size is normal. There is mildly elevated pulmonary artery systolic  pressure.  3. Trivial mitral valve regurgitation.   4. The aortic valve is grossly normal. Aortic valve regurgitation is  trivial.   5. The inferior vena cava is normal in size with greater than 50%  respiratory variability, suggesting right atrial pressure of 3 mmHg.   Comparison(s): No significant change from prior study.    Laboratory Data:  High Sensitivity Troponin:   Recent Labs  Lab 04/18/22 1631  TROPONINIHS 22*     Chemistry Recent Labs  Lab 04/18/22 1631 04/19/22 0445  NA 138 135  K 3.7 3.5  CL 104 103  CO2 22 24  GLUCOSE 65* 99  BUN 24* 23  CREATININE 0.87 0.83  CALCIUM 7.9* 7.7*  GFRNONAA >60 >60  ANIONGAP 12 8    Recent Labs  Lab 04/18/22 1631  PROT 5.3*  ALBUMIN 2.8*  AST 19  ALT 13  ALKPHOS 59  BILITOT 1.1   Lipids No results for input(s): "CHOL", "TRIG", "HDL", "LABVLDL", "LDLCALC", "CHOLHDL" in the last 168 hours.  Hematology Recent Labs  Lab 04/18/22 1631  04/19/22 0445  WBC 14.8* 14.9*  RBC 4.67 4.32  HGB 13.2 12.1  HCT 42.9 39.4  MCV 91.9 91.2  MCH 28.3 28.0  MCHC 30.8 30.7  RDW 18.2* 18.1*  PLT 256 233   Thyroid  Recent Labs  Lab 04/18/22 1631  TSH 1.296    BNP Recent Labs  Lab 04/18/22 1631  BNP 290.4*    DDimer No results for input(s): "DDIMER" in the last 168 hours.   Radiology/Studies:  CT Angio Chest PE W and/or Wo Contrast  Result Date: 04/18/2022 CLINICAL DATA:  Left-sided pleural effusion as well as abdominal distension. EXAM: CT ANGIOGRAPHY CHEST CT ABDOMEN AND PELVIS WITH CONTRAST TECHNIQUE: Multidetector CT imaging of the chest was performed using the standard protocol during bolus administration of intravenous contrast. Multiplanar CT image reconstructions and MIPs were obtained to evaluate the vascular anatomy. Multidetector CT imaging of the abdomen and pelvis was performed using the standard protocol during bolus administration of intravenous contrast. RADIATION DOSE REDUCTION: This exam was performed according to the departmental dose-optimization program which includes automated exposure control, adjustment of the mA and/or kV according to patient size and/or use of iterative reconstruction technique. CONTRAST:  21m OMNIPAQUE IOHEXOL 350 MG/ML SOLN COMPARISON:  Chest CT dated 08/10/2021. CT abdomen pelvis dated 07/29/2019. FINDINGS: Evaluation is limited due to respiratory motion as well as due to streak artifact caused by patient's arms and left shoulder arthroplasty. CTA CHEST FINDINGS Cardiovascular: There is no cardiomegaly no pericardial effusion. Mild atherosclerotic calcification of the thoracic aorta. No aneurysmal dilatation or dissection. Evaluation of the pulmonary arteries is limited due to factors above. No pulmonary artery embolus identified. Mediastinum/Nodes: No hilar or mediastinal adenopathy. Small calcified granuloma. There is a small hiatal hernia. Mild thickened appearance of the distal esophagus  may represent esophagitis. Clinical correlation is recommended. Ingested content noted within the esophagus which may represent delayed clearance for reflux. Underlying distal esophageal stricture or mass is not excluded. Endoscopy may provide better evaluation if clinically indicated. Lungs/Pleura: Small bilateral pleural effusions, left greater than right and increased since the prior CT. There is partial compressive atelectasis of the lower lobes. Pneumonia is less likely but not excluded. Right lung base linear atelectasis/scarring. There is no pneumothorax. There is a 3 mm right middle lobe nodule or granuloma. The central airways are patent. Musculoskeletal: Total left shoulder arthroplasty. Osteopenia with degenerative changes of the spine. No acute osseous pathology. Review of the MIP images  confirms the above findings. CT ABDOMEN and PELVIS FINDINGS No intra-abdominal free air or free fluid. Hepatobiliary: Several small liver cysts and additional subcentimeter hypodense lesions which are too small to characterize. No biliary ductal dilatation. The gallbladder is unremarkable Pancreas: Unremarkable. No pancreatic ductal dilatation or surrounding inflammatory changes. Spleen: Normal in size without focal abnormality. Adrenals/Urinary Tract: Mild thickened appearance of the medial limb of the right adrenal gland as well as an indeterminate subcentimeter left adrenal nodule similar to 2021, likely a benign adenoma. Mild bilateral renal parenchyma atrophy. There is a 3 mm nonobstructing left renal inferior pole calculus. Small bilateral upper pole cysts. Several additional subcentimeter hypodense lesions are too small to characterize. There is no hydronephrosis on either side. There is symmetric enhancement and excretion of contrast by both kidneys. The visualized ureters appear unremarkable. The urinary bladder is distended and grossly unremarkable. Stomach/Bowel: Small hiatal hernia. There is moderate stool  throughout the colon. Several small scattered sigmoid diverticula without active inflammatory changes. There is no bowel obstruction or active inflammation. The appendix is not visualized with certainty. No inflammatory changes identified in the right lower quadrant. Vascular/Lymphatic: Moderate aortoiliac atherosclerotic disease. The IVC is unremarkable. No portal venous gas. There is no adenopathy. Reproductive: The uterus is poorly visualized. A 2.1 cm left ovarian cyst. No follow-up imaging recommended. Note: This recommendation does not apply to premenarchal patients and to those with increased risk (genetic, family history, elevated tumor markers or other high-risk factors) of ovarian cancer. Reference: JACR 2020 Feb; 17(2):248-254 Other: None Musculoskeletal: There is a total right hip arthroplasty and prior ORIF of left femoral neck fracture. Old healed bilateral pubic rami fractures. Osteopenia with degenerative changes of the spine. No acute osseous pathology. Review of the MIP images confirms the above findings. IMPRESSION: 1. No CT evidence of pulmonary artery embolus. 2. Small bilateral pleural effusions, left greater than right and increased since the prior CT. No acute intra-abdominal or pelvic pathology. 3. A 3 mm nonobstructing left renal inferior pole calculus. No hydronephrosis. 4. Colonic diverticulosis. No bowel obstruction. 5. Small hiatal hernia. Ingested content within the esophagus may represent delayed clearance for reflux. 6.  Aortic Atherosclerosis (ICD10-I70.0). Electronically Signed   By: Anner Crete M.D.   On: 04/18/2022 19:48   CT ABDOMEN PELVIS W CONTRAST  Result Date: 04/18/2022 CLINICAL DATA:  Left-sided pleural effusion as well as abdominal distension. EXAM: CT ANGIOGRAPHY CHEST CT ABDOMEN AND PELVIS WITH CONTRAST TECHNIQUE: Multidetector CT imaging of the chest was performed using the standard protocol during bolus administration of intravenous contrast. Multiplanar CT  image reconstructions and MIPs were obtained to evaluate the vascular anatomy. Multidetector CT imaging of the abdomen and pelvis was performed using the standard protocol during bolus administration of intravenous contrast. RADIATION DOSE REDUCTION: This exam was performed according to the departmental dose-optimization program which includes automated exposure control, adjustment of the mA and/or kV according to patient size and/or use of iterative reconstruction technique. CONTRAST:  59m OMNIPAQUE IOHEXOL 350 MG/ML SOLN COMPARISON:  Chest CT dated 08/10/2021. CT abdomen pelvis dated 07/29/2019. FINDINGS: Evaluation is limited due to respiratory motion as well as due to streak artifact caused by patient's arms and left shoulder arthroplasty. CTA CHEST FINDINGS Cardiovascular: There is no cardiomegaly no pericardial effusion. Mild atherosclerotic calcification of the thoracic aorta. No aneurysmal dilatation or dissection. Evaluation of the pulmonary arteries is limited due to factors above. No pulmonary artery embolus identified. Mediastinum/Nodes: No hilar or mediastinal adenopathy. Small calcified granuloma. There is a small hiatal  hernia. Mild thickened appearance of the distal esophagus may represent esophagitis. Clinical correlation is recommended. Ingested content noted within the esophagus which may represent delayed clearance for reflux. Underlying distal esophageal stricture or mass is not excluded. Endoscopy may provide better evaluation if clinically indicated. Lungs/Pleura: Small bilateral pleural effusions, left greater than right and increased since the prior CT. There is partial compressive atelectasis of the lower lobes. Pneumonia is less likely but not excluded. Right lung base linear atelectasis/scarring. There is no pneumothorax. There is a 3 mm right middle lobe nodule or granuloma. The central airways are patent. Musculoskeletal: Total left shoulder arthroplasty. Osteopenia with degenerative  changes of the spine. No acute osseous pathology. Review of the MIP images confirms the above findings. CT ABDOMEN and PELVIS FINDINGS No intra-abdominal free air or free fluid. Hepatobiliary: Several small liver cysts and additional subcentimeter hypodense lesions which are too small to characterize. No biliary ductal dilatation. The gallbladder is unremarkable Pancreas: Unremarkable. No pancreatic ductal dilatation or surrounding inflammatory changes. Spleen: Normal in size without focal abnormality. Adrenals/Urinary Tract: Mild thickened appearance of the medial limb of the right adrenal gland as well as an indeterminate subcentimeter left adrenal nodule similar to 2021, likely a benign adenoma. Mild bilateral renal parenchyma atrophy. There is a 3 mm nonobstructing left renal inferior pole calculus. Small bilateral upper pole cysts. Several additional subcentimeter hypodense lesions are too small to characterize. There is no hydronephrosis on either side. There is symmetric enhancement and excretion of contrast by both kidneys. The visualized ureters appear unremarkable. The urinary bladder is distended and grossly unremarkable. Stomach/Bowel: Small hiatal hernia. There is moderate stool throughout the colon. Several small scattered sigmoid diverticula without active inflammatory changes. There is no bowel obstruction or active inflammation. The appendix is not visualized with certainty. No inflammatory changes identified in the right lower quadrant. Vascular/Lymphatic: Moderate aortoiliac atherosclerotic disease. The IVC is unremarkable. No portal venous gas. There is no adenopathy. Reproductive: The uterus is poorly visualized. A 2.1 cm left ovarian cyst. No follow-up imaging recommended. Note: This recommendation does not apply to premenarchal patients and to those with increased risk (genetic, family history, elevated tumor markers or other high-risk factors) of ovarian cancer. Reference: JACR 2020 Feb;  17(2):248-254 Other: None Musculoskeletal: There is a total right hip arthroplasty and prior ORIF of left femoral neck fracture. Old healed bilateral pubic rami fractures. Osteopenia with degenerative changes of the spine. No acute osseous pathology. Review of the MIP images confirms the above findings. IMPRESSION: 1. No CT evidence of pulmonary artery embolus. 2. Small bilateral pleural effusions, left greater than right and increased since the prior CT. No acute intra-abdominal or pelvic pathology. 3. A 3 mm nonobstructing left renal inferior pole calculus. No hydronephrosis. 4. Colonic diverticulosis. No bowel obstruction. 5. Small hiatal hernia. Ingested content within the esophagus may represent delayed clearance for reflux. 6.  Aortic Atherosclerosis (ICD10-I70.0). Electronically Signed   By: Anner Crete M.D.   On: 04/18/2022 19:48   CT Cervical Spine Wo Contrast  Result Date: 04/18/2022 CLINICAL DATA:  [place], weakness EXAM: CT CERVICAL SPINE WITHOUT CONTRAST TECHNIQUE: Multidetector CT imaging of the cervical spine was performed without intravenous contrast. Multiplanar CT image reconstructions were also generated. RADIATION DOSE REDUCTION: This exam was performed according to the departmental dose-optimization program which includes automated exposure control, adjustment of the mA and/or kV according to patient size and/or use of iterative reconstruction technique. COMPARISON:  None Available. FINDINGS: Alignment: There is mild degenerative retrolisthesis of C3 on C4  and C6 on C7. Otherwise alignment is grossly anatomic. 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 There are no acute or destructive bony abnormalities. Soft tissues and spinal canal: No prevertebral fluid or swelling. No visible canal hematoma. Disc levels: There is severe multilevel spondylosis and facet hypertrophy, greatest at C3-4, C4-5, and C6-7. Marked hypertrophic changes  are seen at the C1-C2 interface. Upper chest: Airway is patent. Emphysematous changes are seen at the lung apices. Mildly distended and fluid-filled proximal thoracic esophagus incidentally noted. Other: Reconstructed images demonstrate no additional findings. IMPRESSION: 1. No acute cervical spine fracture. 2. Extensive multilevel degenerative changes. Electronically Signed   By: Randa Ngo M.D.   On: 04/18/2022 19:37   CT Head Wo Contrast  Result Date: 04/18/2022 CLINICAL DATA:  [place], weakness EXAM: CT HEAD WITHOUT CONTRAST TECHNIQUE: Contiguous axial images were obtained from the base of the skull through the vertex without intravenous contrast. RADIATION DOSE REDUCTION: This exam was performed according to the departmental dose-optimization program which includes automated exposure control, adjustment of the mA and/or kV according to patient size and/or use of iterative reconstruction technique. COMPARISON:  07/18/2019 FINDINGS: Brain: Chronic small vessel ischemic changes are seen throughout the periventricular and subcortical white matter, grossly stable. No evidence of acute infarct or hemorrhage. Lateral ventricles and midline structures are otherwise unremarkable. No acute extra-axial fluid collections. No mass effect. Vascular: No hyperdense vessel or unexpected calcification. Skull: Normal. Negative for fracture or focal lesion. Sinuses/Orbits: Retained secretions within the sphenoid and right maxillary sinuses, with superimposed gas fluid levels. Remaining paranasal sinuses are clear. Other: None. IMPRESSION: 1. No acute intracranial process. 2. Right maxillary and sphenoid sinus disease. Electronically Signed   By: Randa Ngo M.D.   On: 04/18/2022 19:35   DG Chest 2 View  Result Date: 04/18/2022 CLINICAL DATA:  Short of breath and fatigue EXAM: CHEST - 2 VIEW COMPARISON:  Radiograph 03/14/2022 FINDINGS: Normal cardiac silhouette small LEFT effusion noted. Hyperinflated lungs. no focal  consolidation. No pneumothorax. Degenerative change of the RIGHT shoulder. Arthroplasty LEFT shoulder. Healed rib fractures on the LEFT. IMPRESSION: 1. New small to moderate LEFT pleural effusion. 2. Hyperinflated lungs without evidence of pneumonia. Electronically Signed   By: Suzy Bouchard M.D.   On: 04/18/2022 16:49     Assessment and Plan:   Paroxysmal Atrial Fibrillation Recently admitted last month with acute COPD exacerbation and RSV. Hospitalization was complicated by atrial fibrillation with RVR likely triggered by advanced lung disease. Echo showed normal LV function. Rates were very difficult to control and she was ultimately started on Amiodarone. Plan was for a short 1 month course of PO Amiodarone after discharge given underlying lung disease and then patient was instructed to stop this. She now presents back to the ED with rapid atrial fibrillation. She was started on IV Amiodarone and is now back in sinus rhythm.  - Maintaining sinus rhythm with rates in the 70s.  - Potassium 3.5 today. TSH normal. Will check Magnesium. - Initially on IV Amiodarone but this was paused around 5:50am. She has maintained sinus rhythm since then. - Continue Eliquis 2.'5mg'$  twice daily. Reduced dose due to weight and age. - Difficult situation. BP soft so unable to use AV nodal agents. She is not a good long-term option Amiodarone candidate given underlying lung disease. However, other antiarrhythmics are also limited due to CAD and CHF. Will review with MD about long term plan.  Non-Obstructive CAD Noted on cardiac  catheterization in 07/2021. High-sensitivity troponin 22 consistent with demand ischemia. EKG shows diffuse ST depressions while in rapid atrial fibrillation which I suspect is rate related.  - No chest pain.  - Will repeat EKG now that she is back in sinus rhythm. - Given advanced age and the fact that she is also on Eliquis, wills top Aspirin. - Previously on Crestor three times weekly but  this was stopped due to memory issues.  Chronic HFpEF BNP mildly elevated at 254. Chest x-ray showed new small to moderate left pleural effusion. Chest CTA negative for PE but also showed small bilateral pleural effusion (left > right). Echo during recent admission in 02/2022 showed LVEF of 60-65% with mild LVH and grade 2 diastolic dysfunction. - She has decreased breath sounds in bases and faint crackles but otherwise appears euvolemic.  - Would hold off on any diuretics given soft BP.  Hypertension History of hypertension; however, she struggled with low BP during recent hospitalization. BP intermittently low here in the ED as well. Most recent BP 102/ 53 (MAP 68) when I was in the room with her. Some of this may be due to poor PO intake. - Only on Lopressor '25mg'$  as needed for palpitation at home. - Continue to hold home Lisinopril.  Hyperlipidemia LDL 105 in 07/2021.  - She was previously on Crestor '10mg'$  three times weekly but this was stopped due to concern that this was contributing to her memory loss.  - Given advanced age and multiple comorbidities, I don't think there is much utility in treating this aggressively.  Otherwise, per primary team: - COPD - Pulmonary sarcoidosis - Bronchiectasis - GERD - Urinary retention - Dementia - Failure to thrive   Risk Assessment/Risk Scores:     New York Heart Association (NYHA) Functional Class NYHA Class III. Suspect dyspnea is mostly from underlying lung disease rather that CHF.  CHA2DS2-VASc Score = 6  This indicates a 9.7% annual risk of stroke. The patient's score is based upon: CHF History: 1 HTN History: 1 Diabetes History: 0 Stroke History: 0 Vascular Disease History: 1 Age Score: 2 Gender Score: 1   For questions or updates, please contact [place] Please consult [email or web address] for contact info under    Signed, Darreld Mclean, PA-C  04/19/2022 9:02 AM   Patient seen and examined   I agree with  findings as noted by Virgie Dad above Pt [facility] to [facility] ED with worsening SOB    Found to be in afib with RVR   IV amiodarone started   Pt has now converted to SR   Breathing is better   ON exam Pt a thin, frail 88 yo in NAD Neck  JVP is normal Lungs   Decreased airflow Cardiac RRR  No S3  NO signficant murmurs Abd is supple    Ext are without edem  Impression; PAF Difficult problem for this patient   Probably contrib to SOB which brough her in (she does not sense fast heart rates)    WIth lung dz, amiodarone is not optimal  Other antiarrhythmics not options for her (even Multaq)    WIth age/frailty, do not feel she is a candidate for AV node ablation and PPM  FOr now I would recomm 200 bid amiodarone po Keep on telemetry   I would stop ASA   No symptoms t osuggest angina, pt on Eliquis Follow clinically for now      Dorris Carnes MD

## 2022-04-19 NOTE — Progress Notes (Signed)
Clinical/Bedside Swallow Evaluation Patient Details  Name: [name] MRN: [id number] Date of Birth: 1934/07/30  Today's Date: 04/19/2022 Time: SLP Start Time (ACUTE ONLY): 0930 SLP Stop Time (ACUTE ONLY): 0945 SLP Time Calculation (min) (ACUTE ONLY): 15 min  Past Medical History:  Past Medical History:  Diagnosis Date   Acute maxillary sinusitis 10/17/2017   Acute posthemorrhagic anemia 08/22/2012   Acute renal failure syndrome ([place]) 01/04/2019   Allergy    SEASONAL   Anemia    Arthritis    Asthma    "slight"    Baker's cyst of knee, right    CAD (coronary artery disease)    a. Myoview 10/15 - normal EF 70% // Myoview 11/16: EF 75%, normal perfusion, low risk // c. LHC 3/17 - LAD irregs, oLCx 70 (neg FFR), mRCA 30, EF 55-65% >> med Rx // Myoview 07/2018:  EF 73, extracardiac uptake, no significant ischemia (reviewed with Dr. Burt Knack), Low Risk // Myoview 6/22: EF 72, normal perfusion, low risk   Carotid stenosis    a. Carotid US 9/15 - Bilateral 1-39% ICA >> FU 2 years // b. Bilateral ICA 1-39 >> FU prn // Carotid US 06/2018: bilat 1-39; fu prn   Closed fracture of right olecranon process 05/09/2017   Closed left subtrochanteric femur fracture S/P Open/closed and reduction, internal medullary fixation  09/05/2012   Dyspnea    due to Sarcoidosis. When is windy or humed   Dysrhythmia    fast heart rate   Edema 10/30/2019   Elbow fracture 04/2017   Right, had surgery   Esophageal reflux    Fracture of inferior pubic ramus ([place]) 02/28/2019   Hematochezia    Hip fracture ([place]) 12/12/2018   History of echocardiogram    a. Echo 10/15 - EF 60-65%, no RWMA   HLD (hyperlipidemia)    Hx of colonoscopy    Hydronephrosis 07/29/2019   Left foot pain 09/02/2014   Assessment: 88 year old with acquired leg length discrepancy on the left who presents with 10 days of acute onset left foot pain. She seemed to be doing well prior to this pain with her orthotics that had been made to correct  her leg length discrepancy. Negative x-rays at outside office which were again reviewed today.   This pain does not seem to impair her function and which has actually been im   Macular pucker, right eye    will have removed on 01/26/21   Osteoporosis    Other chronic pulmonary heart diseases    Paroxysmal supraventricular tachycardia    Pneumonia    Pneumonia 09/18/2019   Pre-op evaluation 06/11/2019   Pyelonephritis 07/29/2019   Sacral insufficiency fracture 12/12/2018   Sarcoidosis    Sepsis ([place]) 07/30/2019   Sepsis due to Escherichia coli (e. 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   COLONOSCOPY     CYSTOSCOPY W/ RETROGRADES Left 07/30/2019   Procedure: CYSTOSCOPY WITH RETROGRADE PYELOGRAM LEFT STENT PLACEMENT;  Surgeon: Ardis Hughs, MD;  Location: [facility];  Service: Urology;  Laterality:  Left;   CYSTOSCOPY WITH RETROGRADE PYELOGRAM, URETEROSCOPY AND STENT PLACEMENT Left 08/20/2019   Procedure: CYSTOSCOPY, URETEROSCOPY AND STENT PLACEMENT;  Surgeon: Robley Fries, MD;  Location: [facility];  Service: Urology;  Laterality: Left;  1 HR   FOOT SURGERY Right    HIP SURGERY Right 2011   full replacement   HOLMIUM LASER APPLICATION Left [id number]   Procedure: HOLMIUM LASER APPLICATION;  Surgeon: Robley Fries, MD;  Location: [facility];  Service: Urology;  Laterality: Left;   LEFT HEART CATH AND CORONARY ANGIOGRAPHY N/A 08/09/2021   Procedure: LEFT HEART CATH AND CORONARY ANGIOGRAPHY;  Surgeon: Sherren Mocha, MD;  Location: [facility];  Service: Cardiovascular;  Laterality: N/A;   LEG SURGERY Left 06/2012   femur fracture s/p open and closed reduction in [place], Dr. Jimmye Norman   lens replacement Right    right eye   ORIF ELBOW FRACTURE Right 05/09/2017   Procedure: ORIF right olecranon fracture with repair/reconstruction, ulnar nerve transposition as needed;  Surgeon: Roseanne Kaufman, MD;  Location: [facility];  Service: Orthopedics;  Laterality: Right;  Requests 90 mins   pelvis fracture     POLYPECTOMY     REVERSE SHOULDER ARTHROPLASTY Left 06/13/2019   Procedure: REVERSE SHOULDER ARTHROPLASTY;  Surgeon: Justice Britain, MD;  Location: [facility];  Service: Orthopedics;  Laterality: Left;  170mn   SHOULDER ARTHROSCOPY W/ ROTATOR CUFF REPAIR Right    TRAPEZIUM RESECTION Right    HPI:  Per MD notes "88y.o. female with medical history significant of A-fib on amiodarone and metoprolol, PSVT, pulmonary sarcoid, scoliosis, HLD, HTN, COPD, bronchiectasis, CAD, low back pain, anemia, GERD, cognitive deficits, diastolic CHF presented with non-specific symptoms of weakness, fatigue and decrease appetite. Failure to thrive in adult -pt was discharged to SNF at the end of January after hospitalization for COPD exacerbation/A.fib with RVR and has continued to decline -extension workup today with CT head, C-spine, CTA chest/abd/pelvis, UA and TSH without infectious or metabolic cause to her decline. Unfortunately, this could be more rapid decline of her health due to normal course of aging in the setting of her worsening comorbities" per MD note. Esophagitis and reflux on CT abd with concern for potential narrowing or mass.  Prior UGI 2012 showed "Significant gastroesophageal reflux. Patulous GE junction. No hiatal hernia. 2. Barium pill passes into the stomach without delay. 3. Prominent cricopharyngeus muscle. Moderate tertiary contractions. 4. Small polypoid defects in the body and antrum of the stomach may represent small gastric  polyps."    Assessment / Plan / Recommendation  Clinical Impression  Patient presents with clinical indications concerning for esophageal dysphagia - including her "globus" sensation as well as delayed subtle cough following sequential swallows of liquids.  In addition, cough noted when"washing down food" - which was eliminated with ceasing this behavior.  No focal CN deficits apparent.    Pt denies dysphagia - and this combined with previously diagnosed dysphagia from ULTI 2012 causes SLP to have concern for chronic deficits with sensory deficits.  She also appears dysarthric today - but no family present to confirm.    Increased facial fullness on right is baseline per prior EPIC picture.    CT abdomen concerning for potential narrowing - as ingested contents in esophagus and recommendation to consider endoscopy advised.  Given pt's respiratory issues prompting admit, cardiac issues and taking eliquis - least invasive further assessment of swallow may be a DG esophagus.  Given h/o prominent CP, etc,   SLP will follow  up for dysphagia intervention as indicated. Spoke to RN and cardiology PA re: recommendations. Advised pt refrain from "washing down" food.  She also reports she takes her large pills cut with liquids. SLP Visit Diagnosis: Dysphagia, pharyngoesophageal phase (R13.14);Dysphagia, unspecified (R13.10)    Aspiration Risk  Mild aspiration risk;Risk for inadequate nutrition/hydration    Diet Recommendation Thin liquid (consider liquids pending results of esophagram)   Medication Administration:  (consider crushed if not contraindicated due to esophageal concerns) Supervision: Intermittent supervision to cue for compensatory strategies Compensations: Slow rate;Small sips/bites;Minimize environmental distractions    Other  Recommendations Recommended Consults: Consider esophageal assessment Oral Care Recommendations: Oral care BID    Recommendations for follow up therapy are one  component of a multi-disciplinary discharge planning process, led by the attending physician.  Recommendations may be updated based on patient status, additional functional criteria and insurance authorization.  Follow up Recommendations No SLP follow up      Assistance Recommended at Discharge    Functional Status Assessment Patient has had a recent decline in their functional status and demonstrates the ability to make significant improvements in function in a reasonable and predictable amount of time.  Frequency and Duration min 1 x/week  1 week       Prognosis Prognosis for improved oropharyngeal function: Fair Barriers to Reach Goals: Cognitive deficits;Time post onset      Swallow Study   General Date of Onset: 04/19/22 HPI: Per MD notes "88 y.o. female with medical history significant of A-fib on amiodarone and metoprolol, PSVT, pulmonary sarcoid, scoliosis, HLD, HTN, COPD, bronchiectasis, CAD, low back pain, anemia, GERD, cognitive deficits, diastolic CHF presented with non-specific symptoms of weakness, fatigue and decrease appetite. Failure to thrive in adult -pt was discharged to SNF at the end of January after hospitalization for COPD exacerbation/A.fib with RVR and has continued to decline -extension workup today with CT head, C-spine, CTA chest/abd/pelvis, UA and TSH without infectious or metabolic cause to her decline. Unfortunately, this could be more rapid decline of her health due to normal course of aging in the setting of her worsening comorbities" per MD note. Esophagitis and reflux on CT abd with concern for potential narrowing or mass.  Prior UGI 2012 showed "Significant gastroesophageal reflux. Patulous GE junction. No hiatal hernia. 2. Barium pill passes into the stomach without delay. 3. Prominent cricopharyngeus muscle. Moderate tertiary contractions. 4. Small polypoid defects in the body and antrum of the stomach may represent small gastric polyps." Type of Study:  Bedside Swallow Evaluation Previous Swallow Assessment: UGI 2012 Diet Prior to this Study: Regular;Thin liquids (Level 0) Temperature Spikes Noted: No Respiratory Status: Room air History of Recent Intubation: No Behavior/Cognition: Alert;Cooperative (thinks she was at home with her daughter PTA, otherwise oriented to person, situation, month, president) Oral Cavity Assessment: Within Functional Limits Oral Care Completed by SLP: Yes Oral Cavity - Dentition: Adequate natural dentition Vision: Functional for self-feeding Self-Feeding Abilities: Able to feed self    Oral/Motor/Sensory Function Overall Oral Motor/Sensory Function: Within functional limits   Ice Chips Ice chips: Not tested   Thin Liquid Thin Liquid: Impaired Presentation: Cup;Self Fed;Straw Pharyngeal  Phase Impairments: Cough - Immediate Other Comments: cough x1 when "washing down food", refraining from this prevented coughing    Nectar Thick Nectar Thick Liquid: Not tested   Honey Thick Honey Thick Liquid: Not tested   Puree Puree: Not tested   Solid     Solid: Within functional limits Presentation: Self Tinnie Gens,  Marguerite Olea 04/19/2022,10:26 AM  Kathleen Lime, MS [place] Office [phone]

## 2022-04-19 NOTE — ED Notes (Signed)
Amioadarone paused for trial.

## 2022-04-19 NOTE — ED Notes (Signed)
Provider notified of patients hypotension. Orders received.

## 2022-04-19 NOTE — Progress Notes (Addendum)
PROGRESS NOTE  [name]  [id number] DOB: April 29, 1934 DOA: 04/18/2022 PCP: Garwin Brothers, MD   Brief Narrative:  Patient is a 88 year old female with history of A-fib, PSVT, sarcoidosis, scoliosis, hypertension, HLD, COPD/bronchiectasis, coronary artery disease, diastolic CHF, GERD who presented from nursing facility with complaint of weakness, fatigue, decreased appetite.  She was recently admitted here for COPD exacerbation with RSV and was discharged on oxygen supplementation.  During that time, hospital course was completed by A-fib with RVR and was on amiodarone.  Patient having gradual decline.  She was also treated for aspiration pneumonia in the facility.  On presentation, she was tachycardic in the range of 140, rhythm showed A-fib, she was mildly hypertensive.  Lab work showed WBC of 14.8. Cardiology consulted.  Assessment & Plan:  Principal Problem:   Atrial fibrillation with RVR (HCC) Active Problems:   CAD (coronary artery disease)   Leukocytosis   Chronic heart failure with preserved ejection fraction (HCC)   COPD (chronic obstructive pulmonary disease) (HCC)   GERD (gastroesophageal reflux disease)   Hypotension   Failure to thrive in adult   Chronic hypoxic respiratory failure ([place])   Acute urinary retention  A-fib with RVR: Recently managed for the same during last hospitalization.  On presentation heart rate up to 140s, with soft blood pressure.  Was on oral amiodarone at  SNF.  IV amiodarone started here, currently in normal sinus rhythm.  Cardiology following.  Recommending to continue oral amiodarone.   On low-dose Eliquis for anticoagulation.  Chronic hypoxic respiratory failure/COPD: History of COPD, pulmonary sarcoidosis.  On supplemental oxygen at 3 L/min.  Currently at baseline.  Suspected esophageal stenosis: speech therapy consulted.  Recommended thin liquid for now and also esophagram.  CT abdomen/pelvis done on 2/26 showed possible distal esophageal  stricture or mass is not excluded .DG esophragm pending.  If it shows any problem, we might consider GI evaluation  Chronic diastolic CHF: Appears euvolemic.  CT chest showed increasing bilateral pleural effusion worse on the left.  Diuretics on hold due to soft blood pressure.  Coronary artery disease: No anginal symptoms.  On aspirin  Leukocytosis:chronically elevated, most likely associated with history with recent use of steroids during last hospitalization  GERD: Continue PPI.   Acute urinary retention: Found to have 1 L of urine on bladder scan.  Foley catheter placed in the emergency department.  UA was not suspicious for UTI.  Started on tamsulosin.  We shd give  voiding trial before discharge  Generalized decline/failure to thrive: Recently admitted here and was discharged with imaginary to SNF after managed for COPD exacerbation/A-fib with RVR.  Has continued to decline.  CT head/spine, CTA chest done on presentation did not show any infectious or metabolic cause for decline.  Has low appetite.  Protein supplement added on her regimen.  Dietitian consulted.  PT/OT will be consulted  Addendum:  Barium esophragm showed evidence of esophgeal dysmotility.Send message to Dr Lorenso Courier [facility]) about the need of GI consult.Started on clear liquid diet,npo after midnight in case the plan is for EGD      DVT prophylaxis:apixaban (ELIQUIS) tablet 2.5 mg Start: 04/19/22 0100 apixaban (ELIQUIS) tablet 2.5 mg     Code Status: DNR  Family Communication: Called and discussed with daughter on phone on 2/27  Patient status:Inpatient  Patient is from :SNF  Anticipated discharge to:SNF  Estimated DC date:1-2 days   Consultants: cardiology  Procedures:None  Antimicrobials:  Anti-infectives (From admission, onward)    None  Subjective:  Patient seen and examined at bedside today.  Hemodynamically stable.  During my evaluation, she was normal sinus rhythm.  Heart rate  well-controlled.  Blood pressure stable.  She looks weak, deconditioned but alert and oriented.  Denies any specific complaints.  Objective: Vitals:   04/19/22 0545 04/19/22 0600 04/19/22 0609 04/19/22 0715  BP:  109/76    Pulse: 72 72  70  Resp: '20 15  19  '$ Temp:   97.6 F (36.4 C)   TempSrc:   Oral   SpO2: 100% 98%  96%  Weight:      Height:        Intake/Output Summary (Last 24 hours) at 04/19/2022 0815 Last data filed at 04/19/2022 [id number] Gross per 24 hour  Intake 466.61 ml  Output 1400 ml  Net -933.39 ml   Filed Weights   04/18/22 1605  Weight: 58 kg    Examination:  General exam: Overall comfortable, not in distress, weak, deconditioned HEENT: PERRL Respiratory system: Diminished sounds bilaterally, no wheezes or crackles  Cardiovascular system: S1 & S2 heard, RRR.  Gastrointestinal system: Abdomen is nondistended, soft and nontender. Central nervous system: Alert and oriented Extremities: No edema, no clubbing ,no cyanosis Skin: No rashes, no ulcers,no icterus   GU: foley  Data Reviewed: I have personally reviewed following labs and imaging studies  CBC: Recent Labs  Lab 04/18/22 1631 04/19/22 0445  WBC 14.8* 14.9*  NEUTROABS 11.5*  --   HGB 13.2 12.1  HCT 42.9 39.4  MCV 91.9 91.2  PLT 256 0000000   Basic Metabolic Panel: Recent Labs  Lab 04/18/22 1631 04/19/22 0445  NA 138 135  K 3.7 3.5  CL 104 103  CO2 22 24  GLUCOSE 65* 99  BUN 24* 23  CREATININE 0.87 0.83  CALCIUM 7.9* 7.7*     Recent Results (from the past 240 hour(s))  Resp panel by RT-PCR (RSV, Flu A&B, Covid) Anterior Nasal Swab     Status: None   Collection Time: 04/18/22  4:33 PM   Specimen: Anterior Nasal Swab  Result Value Ref Range Status   SARS Coronavirus 2 by RT PCR NEGATIVE NEGATIVE Final    Comment: (NOTE) SARS-CoV-2 target nucleic acids are NOT DETECTED.  The SARS-CoV-2 RNA is generally detectable in upper respiratory specimens during the acute phase of infection. The  lowest concentration of SARS-CoV-2 viral copies this assay can detect is 138 copies/mL. A negative result does not preclude SARS-Cov-2 infection and should not be used as the sole basis for treatment or other patient management decisions. A negative result may occur with  improper specimen collection/handling, submission of specimen other than nasopharyngeal swab, presence of viral mutation(s) within the areas targeted by this assay, and inadequate number of viral copies(<138 copies/mL). A negative result must be combined with clinical observations, patient history, and epidemiological information. The expected result is Negative.  Fact Sheet for Patients:  EntrepreneurPulse.com.au  Fact Sheet for Healthcare Providers:  IncredibleEmployment.be  This test is no t yet approved or cleared by the [place] FDA and  has been authorized for detection and/or diagnosis of SARS-CoV-2 by FDA under an Emergency Use Authorization (EUA). This EUA will remain  in effect (meaning this test can be used) for the duration of the COVID-19 declaration under Section 564(b)(1) of the Act, 21 U.S.C.section 360bbb-3(b)(1), unless the authorization is terminated  or revoked sooner.       Influenza A by PCR NEGATIVE NEGATIVE Final   Influenza B by PCR NEGATIVE  NEGATIVE Final    Comment: (NOTE) The Xpert Xpress SARS-CoV-2/FLU/RSV plus assay is intended as an aid in the diagnosis of influenza from Nasopharyngeal swab specimens and should not be used as a sole basis for treatment. Nasal washings and aspirates are unacceptable for Xpert Xpress SARS-CoV-2/FLU/RSV testing.  Fact Sheet for Patients: EntrepreneurPulse.com.au  Fact Sheet for Healthcare Providers: IncredibleEmployment.be  This test is not yet approved or cleared by the [place] FDA and has been authorized for detection and/or diagnosis of SARS-CoV-2 by FDA under  an Emergency Use Authorization (EUA). This EUA will remain in effect (meaning this test can be used) for the duration of the COVID-19 declaration under Section 564(b)(1) of the Act, 21 U.S.C. section 360bbb-3(b)(1), unless the authorization is terminated or revoked.     Resp Syncytial Virus by PCR NEGATIVE NEGATIVE Final    Comment: (NOTE) Fact Sheet for Patients: EntrepreneurPulse.com.au  Fact Sheet for Healthcare Providers: IncredibleEmployment.be  This test is not yet approved or cleared by the [place] FDA and has been authorized for detection and/or diagnosis of SARS-CoV-2 by FDA under an Emergency Use Authorization (EUA). This EUA will remain in effect (meaning this test can be used) for the duration of the COVID-19 declaration under Section 564(b)(1) of the Act, 21 U.S.C. section 360bbb-3(b)(1), unless the authorization is terminated or revoked.  Performed at [facility], [place] [address], [place], [place] [address]      Radiology Studies: CT Angio Chest PE W and/or Wo Contrast  Result Date: 04/18/2022 CLINICAL DATA:  Left-sided pleural effusion as well as abdominal distension. EXAM: CT ANGIOGRAPHY CHEST CT ABDOMEN AND PELVIS WITH CONTRAST TECHNIQUE: Multidetector CT imaging of the chest was performed using the standard protocol during bolus administration of intravenous contrast. Multiplanar CT image reconstructions and MIPs were obtained to evaluate the vascular anatomy. Multidetector CT imaging of the abdomen and pelvis was performed using the standard protocol during bolus administration of intravenous contrast. RADIATION DOSE REDUCTION: This exam was performed according to the departmental dose-optimization program which includes automated exposure control, adjustment of the mA and/or kV according to patient size and/or use of iterative reconstruction technique. CONTRAST:  25m OMNIPAQUE IOHEXOL 350 MG/ML SOLN  COMPARISON:  Chest CT dated 08/10/2021. CT abdomen pelvis dated 07/29/2019. FINDINGS: Evaluation is limited due to respiratory motion as well as due to streak artifact caused by patient's arms and left shoulder arthroplasty. CTA CHEST FINDINGS Cardiovascular: There is no cardiomegaly no pericardial effusion. Mild atherosclerotic calcification of the thoracic aorta. No aneurysmal dilatation or dissection. Evaluation of the pulmonary arteries is limited due to factors above. No pulmonary artery embolus identified. Mediastinum/Nodes: No hilar or mediastinal adenopathy. Small calcified granuloma. There is a small hiatal hernia. Mild thickened appearance of the distal esophagus may represent esophagitis. Clinical correlation is recommended. Ingested content noted within the esophagus which may represent delayed clearance for reflux. Underlying distal esophageal stricture or mass is not excluded. Endoscopy may provide better evaluation if clinically indicated. Lungs/Pleura: Small bilateral pleural effusions, left greater than right and increased since the prior CT. There is partial compressive atelectasis of the lower lobes. Pneumonia is less likely but not excluded. Right lung base linear atelectasis/scarring. There is no pneumothorax. There is a 3 mm right middle lobe nodule or granuloma. The central airways are patent. Musculoskeletal: Total left shoulder arthroplasty. Osteopenia with degenerative changes of the spine. No acute osseous pathology. Review of the MIP images confirms the above findings. CT ABDOMEN and PELVIS FINDINGS No intra-abdominal free air or  free fluid. Hepatobiliary: Several small liver cysts and additional subcentimeter hypodense lesions which are too small to characterize. No biliary ductal dilatation. The gallbladder is unremarkable Pancreas: Unremarkable. No pancreatic ductal dilatation or surrounding inflammatory changes. Spleen: Normal in size without focal abnormality. Adrenals/Urinary Tract:  Mild thickened appearance of the medial limb of the right adrenal gland as well as an indeterminate subcentimeter left adrenal nodule similar to 2021, likely a benign adenoma. Mild bilateral renal parenchyma atrophy. There is a 3 mm nonobstructing left renal inferior pole calculus. Small bilateral upper pole cysts. Several additional subcentimeter hypodense lesions are too small to characterize. There is no hydronephrosis on either side. There is symmetric enhancement and excretion of contrast by both kidneys. The visualized ureters appear unremarkable. The urinary bladder is distended and grossly unremarkable. Stomach/Bowel: Small hiatal hernia. There is moderate stool throughout the colon. Several small scattered sigmoid diverticula without active inflammatory changes. There is no bowel obstruction or active inflammation. The appendix is not visualized with certainty. No inflammatory changes identified in the right lower quadrant. Vascular/Lymphatic: Moderate aortoiliac atherosclerotic disease. The IVC is unremarkable. No portal venous gas. There is no adenopathy. Reproductive: The uterus is poorly visualized. A 2.1 cm left ovarian cyst. No follow-up imaging recommended. Note: This recommendation does not apply to premenarchal patients and to those with increased risk (genetic, family history, elevated tumor markers or other high-risk factors) of ovarian cancer. Reference: JACR 2020 Feb; 17(2):248-254 Other: None Musculoskeletal: There is a total right hip arthroplasty and prior ORIF of left femoral neck fracture. Old healed bilateral pubic rami fractures. Osteopenia with degenerative changes of the spine. No acute osseous pathology. Review of the MIP images confirms the above findings. IMPRESSION: 1. No CT evidence of pulmonary artery embolus. 2. Small bilateral pleural effusions, left greater than right and increased since the prior CT. No acute intra-abdominal or pelvic pathology. 3. A 3 mm nonobstructing left  renal inferior pole calculus. No hydronephrosis. 4. Colonic diverticulosis. No bowel obstruction. 5. Small hiatal hernia. Ingested content within the esophagus may represent delayed clearance for reflux. 6.  Aortic Atherosclerosis (ICD10-I70.0). Electronically Signed   By: Anner Crete M.D.   On: 04/18/2022 19:48   CT ABDOMEN PELVIS W CONTRAST  Result Date: 04/18/2022 CLINICAL DATA:  Left-sided pleural effusion as well as abdominal distension. EXAM: CT ANGIOGRAPHY CHEST CT ABDOMEN AND PELVIS WITH CONTRAST TECHNIQUE: Multidetector CT imaging of the chest was performed using the standard protocol during bolus administration of intravenous contrast. Multiplanar CT image reconstructions and MIPs were obtained to evaluate the vascular anatomy. Multidetector CT imaging of the abdomen and pelvis was performed using the standard protocol during bolus administration of intravenous contrast. RADIATION DOSE REDUCTION: This exam was performed according to the departmental dose-optimization program which includes automated exposure control, adjustment of the mA and/or kV according to patient size and/or use of iterative reconstruction technique. CONTRAST:  75m OMNIPAQUE IOHEXOL 350 MG/ML SOLN COMPARISON:  Chest CT dated 08/10/2021. CT abdomen pelvis dated 07/29/2019. FINDINGS: Evaluation is limited due to respiratory motion as well as due to streak artifact caused by patient's arms and left shoulder arthroplasty. CTA CHEST FINDINGS Cardiovascular: There is no cardiomegaly no pericardial effusion. Mild atherosclerotic calcification of the thoracic aorta. No aneurysmal dilatation or dissection. Evaluation of the pulmonary arteries is limited due to factors above. No pulmonary artery embolus identified. Mediastinum/Nodes: No hilar or mediastinal adenopathy. Small calcified granuloma. There is a small hiatal hernia. Mild thickened appearance of the distal esophagus may represent esophagitis. Clinical correlation is  recommended. Ingested content noted within the esophagus which may represent delayed clearance for reflux. Underlying distal esophageal stricture or mass is not excluded. Endoscopy may provide better evaluation if clinically indicated. Lungs/Pleura: Small bilateral pleural effusions, left greater than right and increased since the prior CT. There is partial compressive atelectasis of the lower lobes. Pneumonia is less likely but not excluded. Right lung base linear atelectasis/scarring. There is no pneumothorax. There is a 3 mm right middle lobe nodule or granuloma. The central airways are patent. Musculoskeletal: Total left shoulder arthroplasty. Osteopenia with degenerative changes of the spine. No acute osseous pathology. Review of the MIP images confirms the above findings. CT ABDOMEN and PELVIS FINDINGS No intra-abdominal free air or free fluid. Hepatobiliary: Several small liver cysts and additional subcentimeter hypodense lesions which are too small to characterize. No biliary ductal dilatation. The gallbladder is unremarkable Pancreas: Unremarkable. No pancreatic ductal dilatation or surrounding inflammatory changes. Spleen: Normal in size without focal abnormality. Adrenals/Urinary Tract: Mild thickened appearance of the medial limb of the right adrenal gland as well as an indeterminate subcentimeter left adrenal nodule similar to 2021, likely a benign adenoma. Mild bilateral renal parenchyma atrophy. There is a 3 mm nonobstructing left renal inferior pole calculus. Small bilateral upper pole cysts. Several additional subcentimeter hypodense lesions are too small to characterize. There is no hydronephrosis on either side. There is symmetric enhancement and excretion of contrast by both kidneys. The visualized ureters appear unremarkable. The urinary bladder is distended and grossly unremarkable. Stomach/Bowel: Small hiatal hernia. There is moderate stool throughout the colon. Several small scattered sigmoid  diverticula without active inflammatory changes. There is no bowel obstruction or active inflammation. The appendix is not visualized with certainty. No inflammatory changes identified in the right lower quadrant. Vascular/Lymphatic: Moderate aortoiliac atherosclerotic disease. The IVC is unremarkable. No portal venous gas. There is no adenopathy. Reproductive: The uterus is poorly visualized. A 2.1 cm left ovarian cyst. No follow-up imaging recommended. Note: This recommendation does not apply to premenarchal patients and to those with increased risk (genetic, family history, elevated tumor markers or other high-risk factors) of ovarian cancer. Reference: JACR 2020 Feb; 17(2):248-254 Other: None Musculoskeletal: There is a total right hip arthroplasty and prior ORIF of left femoral neck fracture. Old healed bilateral pubic rami fractures. Osteopenia with degenerative changes of the spine. No acute osseous pathology. Review of the MIP images confirms the above findings. IMPRESSION: 1. No CT evidence of pulmonary artery embolus. 2. Small bilateral pleural effusions, left greater than right and increased since the prior CT. No acute intra-abdominal or pelvic pathology. 3. A 3 mm nonobstructing left renal inferior pole calculus. No hydronephrosis. 4. Colonic diverticulosis. No bowel obstruction. 5. Small hiatal hernia. Ingested content within the esophagus may represent delayed clearance for reflux. 6.  Aortic Atherosclerosis (ICD10-I70.0). Electronically Signed   By: Anner Crete M.D.   On: 04/18/2022 19:48   CT Cervical Spine Wo Contrast  Result Date: 04/18/2022 CLINICAL DATA:  [place], weakness EXAM: CT CERVICAL SPINE WITHOUT CONTRAST TECHNIQUE: Multidetector CT imaging of the cervical spine was performed without intravenous contrast. Multiplanar CT image reconstructions were also generated. RADIATION DOSE REDUCTION: This exam was performed according to the departmental dose-optimization program which includes  automated exposure control, adjustment of the mA and/or kV according to patient size and/or use of iterative reconstruction technique. COMPARISON:  None Available. FINDINGS: Alignment: There is mild degenerative retrolisthesis of C3 on C4 and C6 on C7. Otherwise alignment is grossly anatomic. 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 There are no acute or destructive bony abnormalities. Soft tissues and spinal canal: No prevertebral fluid or swelling. No visible canal hematoma. Disc levels: There is severe multilevel spondylosis and facet hypertrophy, greatest at C3-4, C4-5, and C6-7. Marked hypertrophic changes are seen at the C1-C2 interface. Upper chest: Airway is patent. Emphysematous changes are seen at the lung apices. Mildly distended and fluid-filled proximal thoracic esophagus incidentally noted. Other: Reconstructed images demonstrate no additional findings. IMPRESSION: 1. No acute cervical spine fracture. 2. Extensive multilevel degenerative changes. Electronically Signed   By: Randa Ngo M.D.   On: 04/18/2022 19:37   CT Head Wo Contrast  Result Date: 04/18/2022 CLINICAL DATA:  [place], weakness EXAM: CT HEAD WITHOUT CONTRAST TECHNIQUE: Contiguous axial images were obtained from the base of the skull through the vertex without intravenous contrast. RADIATION DOSE REDUCTION: This exam was performed according to the departmental dose-optimization program which includes automated exposure control, adjustment of the mA and/or kV according to patient size and/or use of iterative reconstruction technique. COMPARISON:  07/18/2019 FINDINGS: Brain: Chronic small vessel ischemic changes are seen throughout the periventricular and subcortical white matter, grossly stable. No evidence of acute infarct or hemorrhage. Lateral ventricles and midline structures are otherwise unremarkable. No acute extra-axial fluid collections. No mass effect. Vascular: No  hyperdense vessel or unexpected calcification. Skull: Normal. Negative for fracture or focal lesion. Sinuses/Orbits: Retained secretions within the sphenoid and right maxillary sinuses, with superimposed gas fluid levels. Remaining paranasal sinuses are clear. Other: None. IMPRESSION: 1. No acute intracranial process. 2. Right maxillary and sphenoid sinus disease. Electronically Signed   By: Randa Ngo M.D.   On: 04/18/2022 19:35   DG Chest 2 View  Result Date: 04/18/2022 CLINICAL DATA:  Short of breath and fatigue EXAM: CHEST - 2 VIEW COMPARISON:  Radiograph 03/14/2022 FINDINGS: Normal cardiac silhouette small LEFT effusion noted. Hyperinflated lungs. no focal consolidation. No pneumothorax. Degenerative change of the RIGHT shoulder. Arthroplasty LEFT shoulder. Healed rib fractures on the LEFT. IMPRESSION: 1. New small to moderate LEFT pleural effusion. 2. Hyperinflated lungs without evidence of pneumonia. Electronically Signed   By: Suzy Bouchard M.D.   On: 04/18/2022 16:49    Scheduled Meds:  apixaban  2.5 mg Oral BID   aspirin  81 mg Oral Daily   feeding supplement  237 mL Oral BID BM   mometasone-formoterol  2 puff Inhalation BID   And   umeclidinium bromide  1 puff Inhalation Daily   oxybutynin  5 mg Oral QHS   pantoprazole  40 mg Oral Daily   sertraline  75 mg Oral Daily   Continuous Infusions:  amiodarone Stopped (04/19/22 0551)     LOS: 0 days   Shelly Coss, MD [facility] P2/27/2024, 8:15 AM

## 2022-04-20 DIAGNOSIS — R933 Abnormal findings on diagnostic imaging of other parts of digestive tract: Secondary | ICD-10-CM

## 2022-04-20 DIAGNOSIS — E440 Moderate protein-calorie malnutrition: Secondary | ICD-10-CM

## 2022-04-20 DIAGNOSIS — I4891 Unspecified atrial fibrillation: Secondary | ICD-10-CM | POA: Diagnosis not present

## 2022-04-20 DIAGNOSIS — K219 Gastro-esophageal reflux disease without esophagitis: Secondary | ICD-10-CM | POA: Diagnosis not present

## 2022-04-20 LAB — CBC
HCT: 37.7 % (ref 36.0–46.0)
Hemoglobin: 11.7 g/dL — ABNORMAL LOW (ref 12.0–15.0)
MCH: 27.9 pg (ref 26.0–34.0)
MCHC: 31 g/dL (ref 30.0–36.0)
MCV: 89.8 fL (ref 80.0–100.0)
Platelets: 221 10*3/uL (ref 150–400)
RBC: 4.2 MIL/uL (ref 3.87–5.11)
RDW: 18.1 % — ABNORMAL HIGH (ref 11.5–15.5)
WBC: 13 10*3/uL — ABNORMAL HIGH (ref 4.0–10.5)
nRBC: 0 % (ref 0.0–0.2)

## 2022-04-20 LAB — GLUCOSE, CAPILLARY
Glucose-Capillary: 57 mg/dL — ABNORMAL LOW (ref 70–99)
Glucose-Capillary: 124 mg/dL — ABNORMAL HIGH (ref 70–99)

## 2022-04-20 LAB — BASIC METABOLIC PANEL WITH GFR
Anion gap: 9 (ref 5–15)
BUN: 21 mg/dL (ref 8–23)
CO2: 25 mmol/L (ref 22–32)
Calcium: 8.1 mg/dL — ABNORMAL LOW (ref 8.9–10.3)
Chloride: 104 mmol/L (ref 98–111)
Creatinine, Ser: 0.75 mg/dL (ref 0.44–1.00)
GFR, Estimated: >60 mL/min
Glucose, Bld: 56 mg/dL — ABNORMAL LOW (ref 70–99)
Potassium: 3.7 mmol/L (ref 3.5–5.1)
Sodium: 138 mmol/L (ref 135–145)

## 2022-04-20 MED ORDER — ADULT MULTIVITAMIN W/MINERALS CH
1.0000 | ORAL_TABLET | Freq: Every day | ORAL | Status: DC
  Administered 2022-04-21 – 2022-04-24 (×4): 1 via ORAL
  Filled 2022-04-20 (×4): qty 1

## 2022-04-20 MED ORDER — FLEET ENEMA 7-19 GM/118ML RE ENEM
1.0000 | ENEMA | Freq: Once | RECTAL | Status: AC | PRN
  Administered 2022-04-20: 1 via RECTAL
  Filled 2022-04-20: qty 1

## 2022-04-20 MED ORDER — PANTOPRAZOLE SODIUM 40 MG IV SOLR
40.0000 mg | Freq: Two times a day (BID) | INTRAVENOUS | Status: DC
  Administered 2022-04-20 – 2022-04-21 (×2): 40 mg via INTRAVENOUS
  Filled 2022-04-20 (×2): qty 10

## 2022-04-20 MED ORDER — DEXTROSE-NACL 5-0.9 % IV SOLN: INTRAVENOUS | Status: DC

## 2022-04-20 MED ORDER — DEXTROSE 50 % IV SOLN
12.5000 g | Freq: Once | INTRAVENOUS | Status: AC
  Administered 2022-04-20: 12.5 g via INTRAVENOUS
  Filled 2022-04-20: qty 50

## 2022-04-20 NOTE — Consult Note (Signed)
[facility] Nurse Consult Note: Reason for Consult: Consult requested for sacrum.   Wound type: Healing Stage 3 pressure injury, .2X.2X.1cm, red and dry, located in a slight "valley."  It is difficult to keep the location from becoming soiled since the patient is frequently incontinent of stool.  Pressure Injury POA: Yes Dressing procedure/placement/frequency: Topical treatment orders provided for bedside nurses to perform as follows to protect from further injury: Foam dressing to sacrum, change Q 3 days or PRN soiling. Please re-consult if further assistance is needed.  Thank-you,  Julien Girt MSN, [place], [place], [place], [place]

## 2022-04-20 NOTE — Evaluation (Signed)
Occupational Therapy Evaluation Patient Details Name: [name] MRN: [id number] DOB: 1934-07-05 Today's Date: 04/20/2022   History of Present Illness Patient is a 88 year old female who presented on 2/26 from [facility] with decreased appetite, SOB and fatigue. Patient was found to have a fib with RVR, chronic hypoxic respiratory failure, and suspected esophageal stenosis.   PMH: a fib, HTN, non obstructive CAD, COPD, GERD, asthma, pulmonary sarcoidosis.   Clinical Impression   Patient is a 88 year old female who was admitted for above. Patient  was living at home was daughter and son in law prior level. Patient's evaluation was limited with onset of dizziness with movements. Patient was mod A for bed mobility. Patient was retunred to supine with onset of dizziness and low bp sitting EOB. Nurse made aware.  Patient was noted to have decreased functional activity tolernace, decreased ROM, decreased BUE strength, decreased endurance, decreased sitting balance, decreased standing balanced, decreased safety awareness, and decreased knowledge of AE/AD impacting participation in ADLs. Patient would continue to benefit from skilled OT services at this time while admitted and after d/c to address noted deficits in order to improve overall safety and independence in ADLs.    Blood pressures: Supine: 108/49 mmhg HR 75 bpm Sitting EOB: 89/53 mmhg  HR 65 bpm Chair position in bed 94/48 mmhg HR 77bpm   Recommendations for follow up therapy are one component of a multi-disciplinary discharge planning process, led by the attending physician.  Recommendations may be updated based on patient status, additional functional criteria and insurance authorization.   Follow Up Recommendations  Skilled nursing-short term rehab (<3 hours/day)     Assistance Recommended at Discharge Frequent or constant Supervision/Assistance  Patient can return home with the following A lot of help with  bathing/dressing/bathroom;Assistance with cooking/housework;Direct supervision/assist for medications management;Assist for transportation;Help with stairs or ramp for entrance;Direct supervision/assist for financial management;A lot of help with walking and/or transfers    Functional Status Assessment  Patient has had a recent decline in their functional status and demonstrates the ability to make significant improvements in function in a reasonable and predictable amount of time.  Equipment Recommendations  None recommended by OT       Precautions / Restrictions Precautions Precaution Comments: monitor BP and HR Restrictions Weight Bearing Restrictions: No      Mobility Bed Mobility Overal bed mobility: Needs Assistance Bed Mobility: Supine to Sit, Sit to Supine     Supine to sit: Min assist Sit to supine: Mod assist   General bed mobility comments: patient needed seqeuncing cues for advancing to EOB. patient needed physical A to get BLE back into bed.       Balance Overall balance assessment: Needs assistance Sitting-balance support: Feet supported, Single extremity supported Sitting balance-Leahy Scale: Fair             ADL either performed or assessed with clinical judgement   ADL Overall ADL's : Needs assistance/impaired Eating/Feeding: NPO   Grooming: Brushing hair;Bed level;Set up   Upper Body Bathing: Minimal assistance;Bed level   Lower Body Bathing: Bed level;Total assistance   Upper Body Dressing : Bed level;Minimal assistance   Lower Body Dressing: Bed level;Total assistance     Toilet Transfer Details (indicate cue type and reason): unable to attempt with dizziness onset with sitting EOB. see above for BP. Toileting- Clothing Manipulation and Hygiene: Total assistance;Bed level                Pertinent Vitals/Pain Pain Assessment  Pain Assessment: No/denies pain     Hand Dominance Right   Extremity/Trunk Assessment Upper Extremity  Assessment Upper Extremity Assessment: Generalized weakness   Lower Extremity Assessment Lower Extremity Assessment: Defer to PT evaluation   Cervical / Trunk Assessment Cervical / Trunk Assessment: Kyphotic   Communication Communication Communication: No difficulties   Cognition Arousal/Alertness: Awake/alert Behavior During Therapy: WFL for tasks assessed/performed Overall Cognitive Status: Difficult to assess         General Comments: patient was increasingly dizzy with movements. patient was oriented to self and family in room. patient asked orientation questions multiple times during session.     General Comments  brusing noted on BUE with patient also noted to have very thin skin            Home Living Family/patient expects to be discharged to:: Skilled nursing facility             Additional Comments: daughter works from home      Prior Functioning/Environment Prior Level of Function : Independent/Modified Independent             Mobility Comments: Uses RW as needed esp more recently, No falls in last 6 months ADLs Comments: independent, does a little cooking. Daughter manages medications. patient lives with son in law and daughter at baseline.        OT Problem List: Decreased activity tolerance;Impaired balance (sitting and/or standing);Decreased coordination;Decreased safety awareness;Decreased knowledge of precautions;Impaired sensation      OT Treatment/Interventions: Self-care/ADL training;Therapeutic exercise;DME and/or AE instruction;Energy conservation;Therapeutic activities;Patient/family education;Balance training    OT Goals(Current goals can be found in the care plan section) Acute Rehab OT Goals Patient Stated Goal: to get to ALF OT Goal Formulation: With patient/family Time For Goal Achievement: 05/04/22 Potential to Achieve Goals: Fair  OT Frequency: Min 2X/week       AM-PAC OT "6 Clicks" Daily Activity     Outcome Measure  Help from another person eating meals?: Total (npo) Help from another person taking care of personal grooming?: A Little Help from another person toileting, which includes using toliet, bedpan, or urinal?: A Lot Help from another person bathing (including washing, rinsing, drying)?: A Lot Help from another person to put on and taking off regular upper body clothing?: A Little Help from another person to put on and taking off regular lower body clothing?: A Lot 6 Click Score: 13   End of Session Nurse Communication: Other (comment) (ok to see patient, updated on BPs during session)  Activity Tolerance: Other (comment) (dizziness) Patient left: in bed;with call bell/phone within reach;with bed alarm set;with family/visitor present  OT Visit Diagnosis: Unsteadiness on feet (R26.81);Other abnormalities of gait and mobility (R26.89);Muscle weakness (generalized) (M62.81);Dizziness and giddiness (R42)                Time: [id number] OT Time Calculation (min): 19 min Charges:  OT General Charges $OT Visit: 1 Visit OT Evaluation $OT Eval Moderate Complexity: 1 Mod  Porsche Noguchi OTR/L, MS Acute Rehabilitation Department Office# [phone]   Willa Rough 04/20/2022, 12:10 PM

## 2022-04-20 NOTE — Progress Notes (Signed)
Rounding Note    Patient Name: [name] Date of Encounter: 04/20/2022  [facility] Cardiologist: Sherren Mocha, MD   Subjective   Patient tired today   Didn't sleep well last night   Breathing a little better   Inpatient Medications    Scheduled Meds:  amiodarone  200 mg Oral BID   apixaban  2.5 mg Oral BID   Chlorhexidine Gluconate Cloth  6 each Topical Daily   feeding supplement  237 mL Oral BID BM   mometasone-formoterol  2 puff Inhalation BID   And   umeclidinium bromide  1 puff Inhalation Daily   oxybutynin  5 mg Oral QHS   pantoprazole (PROTONIX) IV  40 mg Intravenous Q24H   sertraline  75 mg Oral Daily   tamsulosin  0.4 mg Oral Daily   Continuous Infusions:  amiodarone Stopped (04/19/22 0551)   dextrose 5 % and 0.9% NaCl 20 mL/hr at 04/20/22 0512   PRN Meds: acetaminophen, ipratropium-albuterol, mouth rinse   Vital Signs    Vitals:   04/19/22 1641 04/19/22 2055 04/20/22 0039 04/20/22 0451  BP: 136/60 (!) 99/52 (!) 102/51 (!) 109/53  Pulse: 77 76 81 75  Resp: '10 20  18  '$ Temp: 98.2 F (36.8 C) 98.1 F (36.7 C) 98.1 F (36.7 C) 98.2 F (36.8 C)  TempSrc: Oral Oral Oral Oral  SpO2: 98% 95% 94% 94%  Weight:      Height:        Intake/Output Summary (Last 24 hours) at 04/20/2022 0807 Last data filed at 04/20/2022 0000 Gross per 24 hour  Intake --  Output 350 ml  Net -350 ml      04/18/2022    4:05 PM 03/18/2022    5:00 AM 03/17/2022    5:00 AM  Last 3 Weights  Weight (lbs) 127 lb 13.9 oz 129 lb 3 oz 126 lb 15.8 oz  Weight (kg) 58 kg 58.6 kg 57.6 kg      Telemetry    SR - Personally Reviewed  ECG    No new  - Personally Reviewed  Physical Exam   GEN: Frail 88yo in NAD  Neck: JVP is normal  Cardiac: RRR, no murmurs Respiratory: Clear to auscultation bilaterally. GI: Soft, nontender, non-distended  MS: No edema;    Labs    High Sensitivity Troponin:   Recent Labs  Lab 04/18/22 1631  TROPONINIHS 22*      Chemistry Recent Labs  Lab 04/18/22 1631 04/19/22 0445 04/20/22 0354  NA 138 135 138  K 3.7 3.5 3.7  CL 104 103 104  CO2 '22 24 25  '$ GLUCOSE 65* 99 56*  BUN 24* 23 21  CREATININE 0.87 0.83 0.75  CALCIUM 7.9* 7.7* 8.1*  PROT 5.3*  --   --   ALBUMIN 2.8*  --   --   AST 19  --   --   ALT 13  --   --   ALKPHOS 59  --   --   BILITOT 1.1  --   --   GFRNONAA >60 >60 >60  ANIONGAP '12 8 9    '$ Lipids No results for input(s): "CHOL", "TRIG", "HDL", "LABVLDL", "LDLCALC", "CHOLHDL" in the last 168 hours.  Hematology Recent Labs  Lab 04/18/22 1631 04/19/22 0445 04/20/22 0354  WBC 14.8* 14.9* 13.0*  RBC 4.67 4.32 4.20  HGB 13.2 12.1 11.7*  HCT 42.9 39.4 37.7  MCV 91.9 91.2 89.8  MCH 28.3 28.0 27.9  MCHC 30.8 30.7 31.0  RDW 18.2* 18.1* 18.1*  PLT 256 233 221   Thyroid  Recent Labs  Lab 04/18/22 1631  TSH 1.296    BNP Recent Labs  Lab 04/18/22 1631  BNP 290.4*    DDimer No results for input(s): "DDIMER" in the last 168 hours.   Radiology    DG ESOPHAGUS W SINGLE CM (SOL OR THIN BA)  Result Date: 04/19/2022 CLINICAL DATA:  88 year old female failure to thrive. Team is requesting an esophagram for further evaluation EXAM: ESOPHAGUS/BARIUM SWALLOW/TABLET STUDY TECHNIQUE: Single contrast examination was performed using thin liquid barium. This exam was performed by Rushie Nyhan, and was supervised and interpreted by Zetta Bills, MD. FLUOROSCOPY: Radiation Exposure Index (as provided by the fluoroscopic device): 12.8 mGy Kerma COMPARISON:  CT of the chest favor 08/11/2022 and CT of the abdomen and pelvis performed also in April 18, 2022 FINDINGS: Swallowing: Appears normal. No vestibular penetration or aspiration seen. Pharynx: Unremarkable. Esophagus: Dilated esophagus, widely patent into the intrathoracic portion the stomach. Esophageal motility: Intact primary wave but with incomplete stripping and substantial stasis in the esophagus also reflux from the intrathoracic  stomach into the esophagus with abundant stasis in the lumen of the esophagus. 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 Thickened folds in the intrathoracic stomach suspected, not well evaluated on this single contrast upper gastrointestinal imaging study. Note that there is limited passage of administered oral contrast media which is somewhat positional from the intrathoracic into the subdiaphragmatic stomach. Stomach is not distended on the current study below the diaphragm and is not well assessed. Gastroesophageal reflux: Spontaneous severe gastroesophageal reflux from the intrathoracic stomach into the esophagus. Ingested 35m barium tablet: Not given Other: None. IMPRESSION: Some esophageal dysmotility with abundant stasis of a dilation of the esophagus in the setting of a mixed type hernia which appears to limited passage of contrast from the intrathoracic portion of the stomach into the abdomen. Stomach not well assessed, correlate with any history of pain or vomiting. Widely patent gastroesophageal junction with signs of spontaneous reflux from the intrathoracic stomach into the esophagus. Fold thickening throughout the intrathoracic stomach in this patient with mixed type hiatal hernia should be correlated with symptoms. Potentially related to gastritis but not well assessed on single contrast imaging. Electronically Signed   By: GZetta BillsM.D.   On: 04/19/2022 16:43   CT Angio Chest PE W and/or Wo Contrast  Result Date: 04/18/2022 CLINICAL DATA:  Left-sided pleural effusion as well as abdominal distension. EXAM: CT ANGIOGRAPHY CHEST CT ABDOMEN AND PELVIS WITH CONTRAST TECHNIQUE: Multidetector CT imaging of the chest was performed using the standard protocol during bolus administration of intravenous contrast. Multiplanar CT image reconstructions and MIPs were obtained to evaluate the vascular anatomy. Multidetector CT imaging  of the abdomen and pelvis was performed using the standard protocol during bolus administration of intravenous contrast. RADIATION DOSE REDUCTION: This exam was performed according to the departmental dose-optimization program which includes automated exposure control, adjustment of the mA and/or kV according to patient size and/or use of iterative reconstruction technique. CONTRAST:  838mOMNIPAQUE IOHEXOL 350 MG/ML SOLN COMPARISON:  Chest CT dated 08/10/2021. CT abdomen pelvis dated 07/29/2019. FINDINGS: Evaluation is limited due to respiratory motion as well as due to streak artifact caused by patient's arms and left shoulder arthroplasty. CTA CHEST FINDINGS Cardiovascular: There is no cardiomegaly no pericardial effusion. Mild atherosclerotic calcification of the thoracic aorta. No aneurysmal dilatation or dissection. Evaluation of the pulmonary arteries  is limited due to factors above. No pulmonary artery embolus identified. Mediastinum/Nodes: No hilar or mediastinal adenopathy. Small calcified granuloma. There is a small hiatal hernia. Mild thickened appearance of the distal esophagus may represent esophagitis. Clinical correlation is recommended. Ingested content noted within the esophagus which may represent delayed clearance for reflux. Underlying distal esophageal stricture or mass is not excluded. Endoscopy may provide better evaluation if clinically indicated. Lungs/Pleura: Small bilateral pleural effusions, left greater than right and increased since the prior CT. There is partial compressive atelectasis of the lower lobes. Pneumonia is less likely but not excluded. Right lung base linear atelectasis/scarring. There is no pneumothorax. There is a 3 mm right middle lobe nodule or granuloma. The central airways are patent. Musculoskeletal: Total left shoulder arthroplasty. Osteopenia with degenerative changes of the spine. No acute osseous pathology. Review of the MIP images confirms the above findings.  CT ABDOMEN and PELVIS FINDINGS No intra-abdominal free air or free fluid. Hepatobiliary: Several small liver cysts and additional subcentimeter hypodense lesions which are too small to characterize. No biliary ductal dilatation. The gallbladder is unremarkable Pancreas: Unremarkable. No pancreatic ductal dilatation or surrounding inflammatory changes. Spleen: Normal in size without focal abnormality. Adrenals/Urinary Tract: Mild thickened appearance of the medial limb of the right adrenal gland as well as an indeterminate subcentimeter left adrenal nodule similar to 2021, likely a benign adenoma. Mild bilateral renal parenchyma atrophy. There is a 3 mm nonobstructing left renal inferior pole calculus. Small bilateral upper pole cysts. Several additional subcentimeter hypodense lesions are too small to characterize. There is no hydronephrosis on either side. There is symmetric enhancement and excretion of contrast by both kidneys. The visualized ureters appear unremarkable. The urinary bladder is distended and grossly unremarkable. Stomach/Bowel: Small hiatal hernia. There is moderate stool throughout the colon. Several small scattered sigmoid diverticula without active inflammatory changes. There is no bowel obstruction or active inflammation. The appendix is not visualized with certainty. No inflammatory changes identified in the right lower quadrant. Vascular/Lymphatic: Moderate aortoiliac atherosclerotic disease. The IVC is unremarkable. No portal venous gas. There is no adenopathy. Reproductive: The uterus is poorly visualized. A 2.1 cm left ovarian cyst. No follow-up imaging recommended. Note: This recommendation does not apply to premenarchal patients and to those with increased risk (genetic, family history, elevated tumor markers or other high-risk factors) of ovarian cancer. Reference: JACR 2020 Feb; 17(2):248-254 Other: None Musculoskeletal: There is a total right hip arthroplasty and prior ORIF of left  femoral neck fracture. Old healed bilateral pubic rami fractures. Osteopenia with degenerative changes of the spine. No acute osseous pathology. Review of the MIP images confirms the above findings. IMPRESSION: 1. No CT evidence of pulmonary artery embolus. 2. Small bilateral pleural effusions, left greater than right and increased since the prior CT. No acute intra-abdominal or pelvic pathology. 3. A 3 mm nonobstructing left renal inferior pole calculus. No hydronephrosis. 4. Colonic diverticulosis. No bowel obstruction. 5. Small hiatal hernia. Ingested content within the esophagus may represent delayed clearance for reflux. 6.  Aortic Atherosclerosis (ICD10-I70.0). Electronically Signed   By: Anner Crete M.D.   On: 04/18/2022 19:48   CT ABDOMEN PELVIS W CONTRAST  Result Date: 04/18/2022 CLINICAL DATA:  Left-sided pleural effusion as well as abdominal distension. EXAM: CT ANGIOGRAPHY CHEST CT ABDOMEN AND PELVIS WITH CONTRAST TECHNIQUE: Multidetector CT imaging of the chest was performed using the standard protocol during bolus administration of intravenous contrast. Multiplanar CT image reconstructions and MIPs were obtained to evaluate the vascular anatomy. Multidetector CT  imaging of the abdomen and pelvis was performed using the standard protocol during bolus administration of intravenous contrast. RADIATION DOSE REDUCTION: This exam was performed according to the departmental dose-optimization program which includes automated exposure control, adjustment of the mA and/or kV according to patient size and/or use of iterative reconstruction technique. CONTRAST:  74m OMNIPAQUE IOHEXOL 350 MG/ML SOLN COMPARISON:  Chest CT dated 08/10/2021. CT abdomen pelvis dated 07/29/2019. FINDINGS: Evaluation is limited due to respiratory motion as well as due to streak artifact caused by patient's arms and left shoulder arthroplasty. CTA CHEST FINDINGS Cardiovascular: There is no cardiomegaly no pericardial effusion.  Mild atherosclerotic calcification of the thoracic aorta. No aneurysmal dilatation or dissection. Evaluation of the pulmonary arteries is limited due to factors above. No pulmonary artery embolus identified. Mediastinum/Nodes: No hilar or mediastinal adenopathy. Small calcified granuloma. There is a small hiatal hernia. Mild thickened appearance of the distal esophagus may represent esophagitis. Clinical correlation is recommended. Ingested content noted within the esophagus which may represent delayed clearance for reflux. Underlying distal esophageal stricture or mass is not excluded. Endoscopy may provide better evaluation if clinically indicated. Lungs/Pleura: Small bilateral pleural effusions, left greater than right and increased since the prior CT. There is partial compressive atelectasis of the lower lobes. Pneumonia is less likely but not excluded. Right lung base linear atelectasis/scarring. There is no pneumothorax. There is a 3 mm right middle lobe nodule or granuloma. The central airways are patent. Musculoskeletal: Total left shoulder arthroplasty. Osteopenia with degenerative changes of the spine. No acute osseous pathology. Review of the MIP images confirms the above findings. CT ABDOMEN and PELVIS FINDINGS No intra-abdominal free air or free fluid. Hepatobiliary: Several small liver cysts and additional subcentimeter hypodense lesions which are too small to characterize. No biliary ductal dilatation. The gallbladder is unremarkable Pancreas: Unremarkable. No pancreatic ductal dilatation or surrounding inflammatory changes. Spleen: Normal in size without focal abnormality. Adrenals/Urinary Tract: Mild thickened appearance of the medial limb of the right adrenal gland as well as an indeterminate subcentimeter left adrenal nodule similar to 2021, likely a benign adenoma. Mild bilateral renal parenchyma atrophy. There is a 3 mm nonobstructing left renal inferior pole calculus. Small bilateral upper pole  cysts. Several additional subcentimeter hypodense lesions are too small to characterize. There is no hydronephrosis on either side. There is symmetric enhancement and excretion of contrast by both kidneys. The visualized ureters appear unremarkable. The urinary bladder is distended and grossly unremarkable. Stomach/Bowel: Small hiatal hernia. There is moderate stool throughout the colon. Several small scattered sigmoid diverticula without active inflammatory changes. There is no bowel obstruction or active inflammation. The appendix is not visualized with certainty. No inflammatory changes identified in the right lower quadrant. Vascular/Lymphatic: Moderate aortoiliac atherosclerotic disease. The IVC is unremarkable. No portal venous gas. There is no adenopathy. Reproductive: The uterus is poorly visualized. A 2.1 cm left ovarian cyst. No follow-up imaging recommended. Note: This recommendation does not apply to premenarchal patients and to those with increased risk (genetic, family history, elevated tumor markers or other high-risk factors) of ovarian cancer. Reference: JACR 2020 Feb; 17(2):248-254 Other: None Musculoskeletal: There is a total right hip arthroplasty and prior ORIF of left femoral neck fracture. Old healed bilateral pubic rami fractures. Osteopenia with degenerative changes of the spine. No acute osseous pathology. Review of the MIP images confirms the above findings. IMPRESSION: 1. No CT evidence of pulmonary artery embolus. 2. Small bilateral pleural effusions, left greater than right and increased since the prior CT. No acute  intra-abdominal or pelvic pathology. 3. A 3 mm nonobstructing left renal inferior pole calculus. No hydronephrosis. 4. Colonic diverticulosis. No bowel obstruction. 5. Small hiatal hernia. Ingested content within the esophagus may represent delayed clearance for reflux. 6.  Aortic Atherosclerosis (ICD10-I70.0). Electronically Signed   By: Anner Crete M.D.   On:  04/18/2022 19:48   CT Cervical Spine Wo Contrast  Result Date: 04/18/2022 CLINICAL DATA:  [place], weakness EXAM: CT CERVICAL SPINE WITHOUT CONTRAST TECHNIQUE: Multidetector CT imaging of the cervical spine was performed without intravenous contrast. Multiplanar CT image reconstructions were also generated. RADIATION DOSE REDUCTION: This exam was performed according to the departmental dose-optimization program which includes automated exposure control, adjustment of the mA and/or kV according to patient size and/or use of iterative reconstruction technique. COMPARISON:  None Available. FINDINGS: Alignment: There is mild degenerative retrolisthesis of C3 on C4 and C6 on C7. Otherwise alignment is grossly anatomic. 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 There are no acute or destructive bony abnormalities. Soft tissues and spinal canal: No prevertebral fluid or swelling. No visible canal hematoma. Disc levels: There is severe multilevel spondylosis and facet hypertrophy, greatest at C3-4, C4-5, and C6-7. Marked hypertrophic changes are seen at the C1-C2 interface. Upper chest: Airway is patent. Emphysematous changes are seen at the lung apices. Mildly distended and fluid-filled proximal thoracic esophagus incidentally noted. Other: Reconstructed images demonstrate no additional findings. IMPRESSION: 1. No acute cervical spine fracture. 2. Extensive multilevel degenerative changes. Electronically Signed   By: Randa Ngo M.D.   On: 04/18/2022 19:37   CT Head Wo Contrast  Result Date: 04/18/2022 CLINICAL DATA:  [place], weakness EXAM: CT HEAD WITHOUT CONTRAST TECHNIQUE: Contiguous axial images were obtained from the base of the skull through the vertex without intravenous contrast. RADIATION DOSE REDUCTION: This exam was performed according to the departmental dose-optimization program which includes automated exposure control, adjustment of the mA  and/or kV according to patient size and/or use of iterative reconstruction technique. COMPARISON:  07/18/2019 FINDINGS: Brain: Chronic small vessel ischemic changes are seen throughout the periventricular and subcortical white matter, grossly stable. No evidence of acute infarct or hemorrhage. Lateral ventricles and midline structures are otherwise unremarkable. No acute extra-axial fluid collections. No mass effect. Vascular: No hyperdense vessel or unexpected calcification. Skull: Normal. Negative for fracture or focal lesion. Sinuses/Orbits: Retained secretions within the sphenoid and right maxillary sinuses, with superimposed gas fluid levels. Remaining paranasal sinuses are clear. Other: None. IMPRESSION: 1. No acute intracranial process. 2. Right maxillary and sphenoid sinus disease. Electronically Signed   By: Randa Ngo M.D.   On: 04/18/2022 19:35   DG Chest 2 View  Result Date: 04/18/2022 CLINICAL DATA:  Short of breath and fatigue EXAM: CHEST - 2 VIEW COMPARISON:  Radiograph 03/14/2022 FINDINGS: Normal cardiac silhouette small LEFT effusion noted. Hyperinflated lungs. no focal consolidation. No pneumothorax. Degenerative change of the RIGHT shoulder. Arthroplasty LEFT shoulder. Healed rib fractures on the LEFT. IMPRESSION: 1. New small to moderate LEFT pleural effusion. 2. Hyperinflated lungs without evidence of pneumonia. Electronically Signed   By: Suzy Bouchard M.D.   On: 04/18/2022 16:49    Cardiac Studies   Left Cardiac Catheterization 08/09/2021: 1.  Mild nonobstructive plaquing in the RCA (dominant vessel), unchanged from previous study 2.  Patent left main with no significant stenosis 3.  Patent LAD with mild proximal vessel stenosis of 40% 4.  Moderate ostial circumflex stenosis estimated at 60 to 70%, unchanged  from the previous study.   The patient has stable coronary anatomy from her prior cardiac catheterization study in 2017.  Recommend continued medical therapy.  Okay to  resume apixaban 2.5 mg twice daily tomorrow morning.   Diagnostic Dominance: Right      _______________   Echocardiogram 03/10/2022: Impressions:  1. Left ventricular ejection fraction, by estimation, is 60 to 65%. The  left ventricle has normal function. The left ventricle has no regional  wall motion abnormalities. There is mild concentric left ventricular  hypertrophy. Left ventricular diastolic  parameters are consistent with Grade II diastolic dysfunction  (pseudonormalization).   2. Right ventricular systolic function is normal. The right ventricular  size is normal. There is mildly elevated pulmonary artery systolic  pressure.   3. Trivial mitral valve regurgitation.   4. The aortic valve is grossly normal. Aortic valve regurgitation is  trivial.   5. The inferior vena cava is normal in size with greater than 50%  respiratory variability, suggesting right atrial pressure of 3 mmHg.   Comparison(s): No significant change from prior study.         Assessment & Plan    1  PAF  Patient is maintaining SR   Currently on amiodarone 200 bid .  Eliquis 2.5 bid     Unfortunately I do not think there are any better options for patient   She will do better if she can maintain SR.     2  CAD  Nonobstructive by cath in June 2023     3  Chronic HFpEF  Volume status overall appears OK   4  HTN  Follow  Off of lisinopril      I would allow for some hypertension before risking hypotension.  For questions or updates, please contact [place] Please consult [email or web address] for contact info under        Signed, Dorris Carnes, MD  04/20/2022, 8:07 AM

## 2022-04-20 NOTE — Progress Notes (Signed)
Initial Nutrition Assessment  DOCUMENTATION CODES:   Non-severe (moderate) malnutrition in context of chronic illness  INTERVENTION:  - Full Liquid Nectar Thick diet per MD. Advance as medically appropriate.  - Automatic house trays to ensure patient receives 3 meals a day.  - Ensure Plus High Protein po BID, each supplement provides 350 kcal and 20 grams of protein. - Magic cup BID with meals, each supplement provides 290 kcal and 9 grams of protein  - Encourage intake at all meals and of supplements.  - Feed patient order to support intake per daughter request.  - Daily multivitamin to support micronutrient needs.  - Monitor weight trends.    NUTRITION DIAGNOSIS:   Moderate Malnutrition related to chronic illness (COPD, CHF) as evidenced by moderate fat depletion, moderate muscle depletion.  GOAL:   Patient will meet greater than or equal to 90% of their needs  MONITOR:   PO intake, Supplement acceptance, Diet advancement, Weight trends  REASON FOR ASSESSMENT:   Consult Assessment of nutrition requirement/status  ASSESSMENT:   88 y.o. female with medical history significant of A-fib on amiodarone and metoprolol, PSVT, pulmonary sarcoid, scoliosis, HLD, HTN, COPD, bronchiectasis, CAD, low back pain, anemia, GERD, diastolic CHF presented with non-specific symptoms of weakness, fatigue and decrease appetite.  Met with patient and daughter at bedside today. Daughter provided most of patient's history. UBW reported to be around 125#. Daughter reports that her mom was recently admitted to the hospital in January and went to a rehab facility after discharge where she has not been eating well. She feels this may have resulted in weight loss however per EMR, weight has been stable/trended up the past year.  Daughter reports that patient wasn't really trying to eat well and had been mostly drinking liquids. Daughter had been bringing in food to encourage patient to eat. Patient  liked to eat a lot of soups. She was not aware of her mom having swallowing problems but did note that when she had soup she would often only have the broth and not the solid pieces.   Patient initially NPO at time of visit but during second visit for physical exam diet advanced to nectar thick full liquids. Daughter feels patient would benefit from automatic house trays to ensure she receives 3 meals a day. She also feels her mom would be agreeable to Ensure and Magic Cup to support intake. Would also like a feed patient order to help get patient set up and her meal easily accessible to promote intake.   Consult included calorie count and H&P reports daughter inquired about a feeding tube. Given patient at advanced age of 21 and with multiple comorbidities reached out to MD to inquire about calorie count.  No response yet.   Medications reviewed and include: -  Labs reviewed:  -   NUTRITION - FOCUSED PHYSICAL EXAM:  Flowsheet Row Most Recent Value  Orbital Region Moderate depletion  Upper Arm Region Severe depletion  Thoracic and Lumbar Region Moderate depletion  Buccal Region Moderate depletion  Temple Region Moderate depletion  Clavicle Bone Region Severe depletion  Clavicle and Acromion Bone Region Severe depletion  Scapular Bone Region Unable to assess  Dorsal Hand Moderate depletion  Patellar Region Mild depletion  Anterior Thigh Region Mild depletion  Posterior Calf Region Mild depletion  Edema (RD Assessment) None  Hair Reviewed  Eyes Reviewed  Mouth Reviewed  Skin Reviewed  Nails Reviewed       Diet Order:   Diet Order  Diet full liquid Room service appropriate? Yes; Fluid consistency: Nectar Thick  Diet effective now                   EDUCATION NEEDS:  Education needs have been addressed  Skin:  Skin Assessment: Skin Integrity Issues: Skin Integrity Issues:: Stage III Stage III: Sacrum  Last BM:  2/27  Height:  Ht Readings from Last 1  Encounters:  04/18/22 '5\' 3"'$  (1.6 m)   Weight:  Wt Readings from Last 1 Encounters:  04/18/22 58 kg   BMI:  Body mass index is 22.65 kg/m.  Estimated Nutritional Needs:  Kcal:  1500-1700 kcals Protein:  70-80 grams Fluid:  >/= 1.5L    Samson Frederic RD, LDN For contact information, refer to [facility].

## 2022-04-20 NOTE — TOC Initial Note (Signed)
Transition of Care [facility]) - Initial/Assessment Note    Patient Details  Name: [name] MRN: [id number] Date of Birth: [birth year]  Transition of Care [facility]) CM/SW Contact:    Henrietta Dine, RN Phone Number: 04/20/2022, 3:30 PM  Clinical Narrative:                 [facility] consult for SNF; pt oriented to self and place; contacted pt's dtr [name]; she says pt is from [facility] but she is not sure where she will go at d/c; she says pt was originally scheduled to go to [place] on 04/25/22; she denies pt experiencing IPV, food insecurity, or difficulty paying utilities; she says pt has transportation; Ms Minette Brine says pt does not wear glasses, dentures, or HA; she says pt did not have [place] services, or home oxygen; she says pt has walker and cane; she says pt is incontinent of bowel/bladder; she also says pt can feed herself; attempted to verify residency and level of care; LVM for [place] at [place]; awaiting return call.  Expected Discharge Plan:  (unsure per pt's dtr [name]) Barriers to Discharge: Continued Medical Work up   Patient Goals and CMS Choice            Expected Discharge Plan and Services   Discharge Planning Services: CM Consult   Living arrangements for the past 2 months: [facility])                                      Prior Living Arrangements/Services Living arrangements for the past 2 months: [place] Nurse, adult) Lives with:: Facility Resident Nurse, adult) Patient language and need for interpreter reviewed:: Yes Do you feel safe going back to the place where you live?: Yes      Need for Family Participation in Patient Care: Yes (Comment) Care giver support system in place?: Yes (comment) Current home services:  (n/a) Criminal Activity/Legal Involvement Pertinent to Current Situation/Hospitalization: No - Comment as needed  Activities of Daily Living Home Assistive  Devices/Equipment: Nebulizer, Shower chair with back, Environmental consultant (specify type) ADL Screening (condition at time of admission) Patient's cognitive ability adequate to safely complete daily activities?: No Is the patient deaf or have difficulty hearing?: No Does the patient have difficulty seeing, even when wearing glasses/contacts?: No Does the patient have difficulty concentrating, remembering, or making decisions?: Yes Patient able to express need for assistance with ADLs?: Yes Does the patient have difficulty dressing or bathing?: Yes Independently performs ADLs?: No Dressing (OT): Dependent Grooming: Dependent Feeding: Needs assistance Bathing: Dependent Toileting: Dependent In/Out Bed: Dependent Walks in Home: Dependent Does the patient have difficulty walking or climbing stairs?: Yes Weakness of Legs: Both Weakness of Arms/Hands: Both  Permission Sought/Granted Permission sought to share information with : Case Manager Permission granted to share information with : Yes, Verbal Permission Granted  Share Information with NAME: [name] (dtr) [phone]           Emotional Assessment Appearance:: Other (Comment Required (UTA) Attitude/Demeanor/Rapport: Avoidant Affect (typically observed): Unable to Assess Orientation: : Oriented to Self, Oriented to Situation Alcohol / Substance Use: Not Applicable Psych Involvement: No (comment)  Admission diagnosis:  A-fib ([place]) [I48.91] Pleural effusion on left [J90] Atrial fibrillation with RVR (HCC) [I48.91] Dyspnea, unspecified type [R06.00] Patient Active Problem List   Diagnosis Date Noted   A-fib ([place]) 04/19/2022   Atrial fibrillation with RVR ([place])  04/18/2022   Hypotension 04/18/2022   Failure to thrive in adult 04/18/2022   Chronic hypoxic respiratory failure ([place]) 04/18/2022   Acute urinary retention 04/18/2022   RSV (respiratory syncytial virus pneumonia) 03/11/2022   COPD exacerbation ([place]) 03/09/2022    Paroxysmal atrial fibrillation with RVR (HCC) 03/09/2022   Acute respiratory failure with hypoxia (HCC) 03/09/2022   Bronchiectasis with acute exacerbation ([place]) 03/04/2022   URI (upper respiratory infection) 03/04/2022   Elevated troponin    Anemia 08/08/2021   Bladder spasm 08/08/2021   Chest pain 08/08/2021   Chest pain, rule out acute myocardial infarction 08/07/2021   Chronic heart failure with preserved ejection fraction ([place]) 02/03/2021   Cognitive decline 01/18/2021   Right knee pain 06/08/2020   Age-related osteoporosis without current pathological fracture 05/01/2020   Nasal congestion 05/01/2020   Personal history of COVID-19 05/01/2020   Bilateral hearing loss 11/15/2019   Post-nasal drainage 11/15/2019   COPD (chronic obstructive pulmonary disease) ([place]) 10/30/2019   Atrial fibrillation (HCC)    Adhesive capsulitis of right shoulder [id number]   Metabolic encephalopathy [id number]   Leukocytosis 07/18/2019   S/P reverse total shoulder arthroplasty, 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   Rotator cuff arthropathy of left shoulder 05/03/2019   Shoulder pain, left 04/29/2019   Low back pain 03/13/2019   Degeneration of lumbar intervertebral disc 02/14/2019   Amnesia 02/06/2018   ETD (Eustachian tube dysfunction), bilateral 11/07/2017   Chronic nonintractable headache 11/07/2017   Otalgia 10/17/2017   Pain in joint of right elbow 05/02/2017   Rib lesion 04/20/2017   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   Atopic dermatitis 04/08/2016   Skin sensation disturbance 03/08/2016   Weakness 12/22/2015   Allergic rhinitis 09/15/2015   CAD (coronary artery disease) 05/18/2015   Essential hypertension 05/18/2015   Carotid artery disease ([place]) 05/18/2015   Cough 03/19/2015   Shortness of breath 12/26/2013   Abnormal gait 11/06/2013   Scoliosis (and kyphoscoliosis), idiopathic 07/30/2013   Acquired unequal leg length on left 04/11/2013   History of fracture 10/18/2012    Palpitations 10/18/2012   Constipation 09/05/2012   History of sinus tachycardia 09/05/2012   Spasm of back muscles 03/12/2012   Lumbar radiculopathy 11/23/2011   Mitral regurgitation 11/22/2011   Low compliance bladder 04/18/2011   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   Carpal tunnel syndrome 02/23/2009   Cardiovascular symptoms 02/23/2009   Vitamin D deficiency 02/23/2009   RECTAL BLEEDING 08/18/2008   PERSONAL HX COLONIC POLYPS 08/18/2008   SKIN CANCER, HX OF 08/14/2008   CARPAL TUNNEL SYNDROME, HX OF 08/14/2008   SARCOIDOSIS, PULMONARY 08/08/2008   HLD (hyperlipidemia) 08/08/2008   Paroxysmal supraventricular tachycardia ([place]) 08/08/2008   GERD (gastroesophageal reflux disease) 08/08/2008   PCP:  Garwin Brothers, MD Pharmacy:   [facility] [place], [place] - [address] [place] [address] Phone: [phone] Fax: [phone]  [facility] - [address], [address] [place] [address] Phone: [phone] Fax: [phone]     Social Determinants of Health (SDOH) Social History: SDOH Screenings   Food Insecurity: No Food Insecurity (04/20/2022)  Housing: Low Risk  (04/20/2022)  Transportation Needs: No Transportation Needs (04/20/2022)  Utilities: Not At Risk (04/20/2022)  Tobacco Use: Low Risk  (04/18/2022)   SDOH Interventions: Food Insecurity Interventions: Inpatient TOC Housing Interventions: Inpatient TOC Transportation Interventions: Inpatient TOC Utilities Interventions: Inpatient TOC   Readmission Risk Interventions  09/26/2019   10:56 AM 08/01/2019    4:14 PM  Readmission Risk Prevention Plan  Transportation Screening Complete Complete  PCP or Specialist Appt within 5-7 Days  Complete  PCP or Specialist Appt within 3-5 Days Complete   Home Care Screening  Complete  Medication Review (RN CM)  Referral to Pharmacy  [place] or  [place] Complete   Social Work Consult for [place] Planning/Counseling Complete   Palliative Care Screening Not Applicable   Medication Review Press photographer) Complete

## 2022-04-20 NOTE — Evaluation (Signed)
Physical Therapy Evaluation Patient Details Name: [name] MRN: [id number] DOB: Jul 18, 1934 Today's Date: 04/20/2022  History of Present Illness  Patient is a 88 year old female who presented on 2/26 from [facility] with decreased appetite, SOB and fatigue. Patient was found to have a fib with RVR, chronic hypoxic respiratory failure, and suspected esophageal stenosis.   PMH: a fib, HTN, non obstructive CAD, COPD, GERD, asthma, pulmonary sarcoidosis.  Clinical Impression  Bed level eval only. During assessment, found pt to be soiled in bed. RN in to assist with cleaning patient up and changing linens. Pt followed commands well. She appears weak and fatigues fairly easily with activity. Deferred OOB on today. Will plan to follow and progress activity as pt is able to tolerate. Recommend return to SNF for continued rehab once medically ready.        Recommendations for follow up therapy are one component of a multi-disciplinary discharge planning process, led by the attending physician.  Recommendations may be updated based on patient status, additional functional criteria and insurance authorization.  Follow Up Recommendations Skilled nursing-short term rehab (<3 hours/day) Can patient physically be transported by private vehicle: No    Assistance Recommended at Discharge Frequent or constant Supervision/Assistance  Patient can return home with the following  Help with stairs or ramp for entrance;Assistance with cooking/housework;Assist for transportation    Equipment Recommendations None recommended by PT  Recommendations for Other Services       Functional Status Assessment Patient has had a recent decline in their functional status and demonstrates the ability to make significant improvements in function in a reasonable and predictable amount of time.     Precautions / Restrictions Precautions Precautions: Fall Precaution Comments: monitor BP and HR Restrictions Weight Bearing  Restrictions: No      Mobility  Bed Mobility Overal bed mobility: Needs Assistance Bed Mobility: Rolling Rolling: Min assist         General bed mobility comments: Rolling multiple times for hygiene-incontinent of BM-total assist for cleaning/hygiene    Transfers                   General transfer comment: NT this session    Ambulation/Gait                  Stairs            Wheelchair Mobility    Modified Rankin (Stroke Patients Only)       Balance                                             Pertinent Vitals/Pain Pain Assessment Pain Assessment: No/denies pain    Home Living Family/patient expects to be discharged to:: Skilled nursing facility Living Arrangements: Children Available Help at Discharge: Family;Available 24 hours/day Type of Home: House Home Access: Stairs to enter   [employer] of Steps: 1   Home Layout: Two level;Able to live on main level with bedroom/bathroom Home Equipment: Rolling Walker (2 wheels);BSC/3in1;Shower seat Additional Comments: daughter works from home    Prior Function Prior Level of Function : Needs assist             Mobility Comments: prior to last admission, was using RW for ambulation .Since hospital admission and brief SNF stay, limited mobility and ability to participate at SNF ADLs Comments: prior to last admission and  SNF stay, was independent.     Hand Dominance   Dominant Hand: Right    Extremity/Trunk Assessment   Upper Extremity Assessment Upper Extremity Assessment: Defer to OT evaluation    Lower Extremity Assessment Lower Extremity Assessment: Generalized weakness    Cervical / Trunk Assessment Cervical / Trunk Assessment: Kyphotic  Communication   Communication: No difficulties  Cognition Arousal/Alertness: Awake/alert Behavior During Therapy: WFL for tasks assessed/performed Overall Cognitive Status: Difficult to assess                                  General Comments: patient was increasingly dizzy with movements (with OT). patient was oriented to self and family in room. patient asked orientation questions multiple times during session.        General Comments General comments (skin integrity, edema, etc.): brusing noted on BUE with patient also noted to have very thin skin    Exercises Total Joint Exercises Ankle Circles/Pumps: AROM, Both, 5 reps Heel Slides: AAROM, Both, 5 reps   Assessment/Plan    PT Assessment Patient needs continued PT services  PT Problem List Decreased strength;Decreased balance;Decreased activity tolerance;Decreased range of motion;Decreased mobility;Decreased knowledge of use of DME       PT Treatment Interventions DME instruction;Gait training;Functional mobility training;Therapeutic activities;Balance training;Patient/family education;Therapeutic exercise    PT Goals (Current goals can be found in the Care Plan section)  Acute Rehab PT Goals Patient Stated Goal: none stated PT Goal Formulation: With patient Time For Goal Achievement: 05/04/22 Potential to Achieve Goals: Fair    Frequency Min 2X/week     Co-evaluation               AM-PAC PT "6 Clicks" Mobility  Outcome Measure Help needed turning from your back to your side while in a flat bed without using bedrails?: A Little Help needed moving from lying on your back to sitting on the side of a flat bed without using bedrails?: A Lot Help needed moving to and from a bed to a chair (including a wheelchair)?: A Lot Help needed standing up from a chair using your arms (e.g., wheelchair or bedside chair)?: A Lot Help needed to walk in hospital room?: Total Help needed climbing 3-5 steps with a railing? : Total 6 Click Score: 11    End of Session   Activity Tolerance: Patient limited by fatigue Patient left: in bed;with call bell/phone within reach;with nursing/sitter in room   PT Visit  Diagnosis: Muscle weakness (generalized) (M62.81);Difficulty in walking, not elsewhere classified (R26.2)    Time: [id number] PT Time Calculation (min) (ACUTE ONLY): 26 min   Charges:   PT Evaluation $PT Eval Moderate Complexity: [place], PT Acute Rehabilitation  Office: [phone]

## 2022-04-20 NOTE — Plan of Care (Signed)
  Problem: Education: Goal: Knowledge of General Education information will improve Description Including pain rating scale, medication(s)/side effects and non-pharmacologic comfort measures Outcome: Progressing   Problem: Health Behavior/Discharge Planning: Goal: Ability to manage health-related needs will improve Outcome: Progressing   

## 2022-04-20 NOTE — Progress Notes (Signed)
[facility] PROGRESS NOTE  [name] (DOB: September 21, 1934) [id number] PCP: Garwin Brothers, MD  Brief Narrative: [name] is an 88 year old female with history of A-fib, PSVT, sarcoidosis, scoliosis, hypertension, HLD, COPD/bronchiectasis, coronary artery disease, diastolic CHF, GERD who presented from nursing facility with complaint of weakness, fatigue, decreased appetite.  She was recently admitted here for COPD exacerbation with RSV and was discharged on oxygen supplementation.  During that time, hospital course was completed by A-fib with RVR and was on amiodarone.  Patient having gradual decline.  She was also treated for aspiration pneumonia in the facility.  On presentation, she was tachycardic in the range of 140, rhythm showed A-fib, she was mildly hypertensive.  Lab work showed WBC of 14.8. Cardiology consulted. GI consulted for concern of esophageal dysmotility/stricture/hiatal hernia. Palliative care also consulted.  Subjective: Pt feels tired, difficult to elaborate but denies feeling sick, just tired.   Objective: BP (!) 104/42 (BP Location: Left Arm)   Pulse 80   Temp 98.4 F (36.9 C) (Oral)   Resp 19   Ht '5\' 3"'$  (1.6 m)   Wt 58 kg   LMP 02/22/1983 (Approximate)   SpO2 93%   BMI 22.65 kg/m   Gen: Frail elderly female in no distress Pulm: Diminished at bases, nonlabored on room air without crackles or wheezes  CV: RRR, no pitting edema GI: Soft, NT, ND, +BS  Neuro: Alert and incompletely oriented but interactive, slowed. No new focal deficits. Ext: Warm, no deformities Skin: No rashes, lesions or ulcers on visualized skin   Assessment & Plan: Principal Problem:   Atrial fibrillation with RVR (HCC) Active Problems:   CAD (coronary artery disease)   Leukocytosis   Chronic heart failure with preserved ejection fraction (HCC)   COPD (chronic obstructive pulmonary disease) (HCC)   GERD (gastroesophageal reflux disease)   Hypotension   Failure to thrive  in adult   Chronic hypoxic respiratory failure (HCC)   Acute urinary retention   A-fib (HCC)   Malnutrition of moderate degree  A-fib with RVR:  - Converted back to NSR with amiodarone. No BP room for BB/CCB. Risk of pulmonary toxicity with longer term amiodarone may be warranted. - Low dose eliquis continued.   Chronic hypoxic respiratory failure/COPD: History of COPD, pulmonary sarcoidosis, and recent admission for RSV. Has been on 3L since that time, though may not need that much.  - Titrate down from oxygen as tolerated.     Suspected esophageal stenosis:  - Continue full liquid diet.  - GI consulted, risk of endoscopy may be prohibitive.    Chronic diastolic CHF: Appears euvolemic. CT chest showed increasing bilateral pleural effusion worse on the left.   - Diuretics on hold due to soft blood pressure.   Coronary artery disease: No anginal symptoms. - DC aspirin since on anticoagulation    Leukocytosis:chronically elevated. No current infectious signs/symptoms. UA without leuk esterase.  - ?if related to silent microaspiration. Will monitor clinically without antibiotics at this time.    GERD:  - Continue PPI.    Acute urinary retention: Has history of same, being treated for urinary incontinence with oxybutynin. Found to have 1L retention this admission - Continue foley. Stop oxybutynin. Plan voiding trial in next 48 hours to allow antimuscarinic washout. Start tamsulosin.  - Follow up with Dr. Claudia Desanctis regardless, may need foley at DC.   Generalized decline/failure to thrive, moderate protein calorie malnutrition: Increasing frequency of admissions and family notes she is at times sleeping all day, wants  curtains drawn, declining to work with PT. Has no appetite and when she does eat, it's just fluids mostly. CT head/spine, CTA chest done on presentation did not show any infectious or metabolic cause for decline.  - RD consulted, will supplement protein as able - Discussed  percutaneous gastrostomy with pt/family, specifically the risks this would present particularly in light of her anatomy and that this would not mediate her risk of aspiration which remains high. They are very understanding.  - Broached topic of palliative care to which the family is agreeable. Will consult to engage here and likely to continue.   Patrecia Pour, MD [facility] [email or web address] 04/20/2022, 3:55 PM

## 2022-04-20 NOTE — Consult Note (Addendum)
Referring Provider: Dr. Shelly Coss Primary Care Physician:  Garwin Brothers, MD Primary Gastroenterologist:  Dr. Scarlette Shorts  Reason for Consultation: Possible esophageal stenosis per CT, esophageal dysmotility per esophagram  HPI: [name] is a 88 y.o. female with a past medical history of hypertension, hyperlipidemia, nonobstructive CAD, diastolic CHF, paroxysmal atrial fibrillation Eliquis, SVT, carotid stenosis, atherosclerosis, asthma, pulmonary sarcoidosis, COPD, GERD and colon polyps.  She was admitted to the hospital 03/09/2022 for COPD exacerbation with RSV which was treated with steroids.  She developed A-fib with RVR during this hospitalization which was treated with Amiodarone.  Echo showed LVEF 60 to 65% with grade 2 diastolic dysfunction.  Her clinical status stabilized and she was discharged to the SNF on oral Amiodarone 03/18/2022.  She presented to the ED 04/18/2022 with worsening shortness of breath, fatigue and decreased appetite.  EKG in the ED showed A-fib with RVR, rate 145 bpm with diffuse ST depression likely rate related per cardiology.  Troponin 22.  BNP 290.  Chest x-ray showed new small to moderate left pleural effusion and hyperinflated lungs.  Chest CTA negative for PE but showed small bilateral pleural effusions and small hiatal hernia. CTAP showed a small hiatal hernia with ingested content with the esophagus and diverticulosis without evidence of diverticulitis or bowel obstruction.  Labs in the ED showed a WBC count of 14.8.  Hemoglobin 13.2.  Hematocrit 42.9.  Platelet 256.  BUN 24.  Creatinine 0.87.  Creatinine 2.8.  Normal LFTs.  Lipase 25.  TSH 1.26.  SARS coronavirus 2 negative.  Due to the abnormal esophageal findings per CT, a barium swallow study was done on 04/19/2022 which identified esophageal dysmotility with abundant stasis with dilatation of the esophagus with a widely patent GE junction with signs of spontaneous reflux.   A GI consult was requested  for further evaluation regarding concerns for esophageal dysmotility with ingested contents in the esophagus again possible esophageal stenosis.  The patient is somewhat somnolent at this time.  She stated she did not sleep very well last night.  Her daughter [name] who is also her POA is at the bedside. Her daughter stated her mother has exhibited a decreased appetite for the past 6 months which has progressively worsened since her hospital admission 02/2022.  She mostly eats a few bites of soup.  She denies having any nausea or vomiting. She has occasional heartburn.  She believes she takes Omeprazole once daily many years. She has pill dysphagia but denies any difficulty swallowing solid foods or liquids.  No regurgitation.  She has chronic constipation for which she takes MiraLAX and milk of magnesia as needed.  She believes her last bowel movement was 1 or 2 days ago.  No rectal bleeding or black stools. Her most recent colonoscopy in 2015 identified a small polyp removed from the colon, path report  showed benign colonic mucosa. Mother with history of colon cancer.  Her daughter stated her mother choked on a Mucinex DM capsule while she was at the SNF and was subsequently diagnosed with aspiration pneumonia treated with antibiotics.  Never underwent an EGD.  He is lost weight, it is unclear how much weight she has lost over the past month. Currently, she denies having any cough or shortness of breath.  No chest pain.  She remains on Eliquis for A-fib.  Her daughter reported her mother has mild cognitive impairment with intermittent confusion.   Echocardiogram 03/10/2022:  1. Left ventricular ejection fraction, by estimation, is 60 to  65%. The  left ventricle has normal function. The left ventricle has no regional  wall motion abnormalities. There is mild concentric left ventricular  hypertrophy. Left ventricular diastolic  parameters are consistent with Grade II diastolic dysfunction   (pseudonormalization).   2. Right ventricular systolic function is normal. The right ventricular  size is normal. There is mildly elevated pulmonary artery systolic  pressure.   3. Trivial mitral valve regurgitation.   4. The aortic valve is grossly normal. Aortic valve regurgitation is  trivial.   5. The inferior vena cava is normal in size with greater than 50%  respiratory variability, suggesting right atrial pressure of 3 mmHg.   PAST GI PROCEDURES:  Colonoscopy 09/18/2013: Diminutive polyp was found in the ascending colon, polypectomy was performed with a cold snare The colon mucosa was otherwise normal - BENIGN POLYPOID COLONIC MUCOSA. - NEGATIVE FOR DYSPLASIA OR MALIGNANCY  Colonoscopy 08/19/2008: 5 mm tubular adenomatous polyp removed from the ascending colon otherwise normal colonoscopy  Colonoscopy 05/31/2004 2 small polyps removed from the ascending colon Small sigmoid diverticula   Past Medical History:  Diagnosis Date   Acute maxillary sinusitis 10/17/2017   Acute posthemorrhagic anemia 08/22/2012   Acute renal failure syndrome ([place]) 01/04/2019   Allergy    SEASONAL   Anemia    Arthritis    Asthma    "slight"    Baker's cyst of knee, right    CAD (coronary artery disease)    a. Myoview 10/15 - normal EF 70% // Myoview 11/16: EF 75%, normal perfusion, low risk // c. LHC 3/17 - LAD irregs, oLCx 70 (neg FFR), mRCA 30, EF 55-65% >> med Rx // Myoview 07/2018:  EF 73, extracardiac uptake, no significant ischemia (reviewed with Dr. Burt Knack), Low Risk // Myoview 6/22: EF 72, normal perfusion, low risk   Carotid stenosis    a. Carotid US 9/15 - Bilateral 1-39% ICA >> FU 2 years // b. Bilateral ICA 1-39 >> FU prn // Carotid US 06/2018: bilat 1-39; fu prn   Closed fracture of right olecranon process 05/09/2017   Closed left subtrochanteric femur fracture S/P Open/closed and reduction, internal medullary fixation  09/05/2012   Dyspnea    due to Sarcoidosis. When is windy or  humed   Dysrhythmia    fast heart rate   Edema 10/30/2019   Elbow fracture 04/2017   Right, had surgery   Esophageal reflux    Fracture of inferior pubic ramus ([place]) 02/28/2019   Hematochezia    Hip fracture ([place]) 12/12/2018   History of echocardiogram    a. Echo 10/15 - EF 60-65%, no RWMA   HLD (hyperlipidemia)    Hx of colonoscopy    Hydronephrosis 07/29/2019   Left foot pain 09/02/2014   Assessment: 88 year old with acquired leg length discrepancy on the left who presents with 10 days of acute onset left foot pain. She seemed to be doing well prior to this pain with her orthotics that had been made to correct her leg length discrepancy. Negative x-rays at outside office which were again reviewed today.   This pain does not seem to impair her function and which has actually been im   Macular pucker, right eye    will have removed on 01/26/21   Osteoporosis    Other chronic pulmonary heart diseases    Paroxysmal supraventricular tachycardia    Pneumonia    Pneumonia 09/18/2019   Pre-op evaluation 06/11/2019   Pyelonephritis 07/29/2019   Sacral insufficiency fracture 12/12/2018  Sarcoidosis    Sepsis ([place]) 07/30/2019   Sepsis due to Escherichia coli (e. 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   COLONOSCOPY     CYSTOSCOPY W/ RETROGRADES Left 07/30/2019   Procedure: CYSTOSCOPY WITH RETROGRADE PYELOGRAM LEFT STENT PLACEMENT;  Surgeon: Ardis Hughs, MD;  Location: [facility];  Service: Urology;  Laterality: Left;   CYSTOSCOPY WITH RETROGRADE PYELOGRAM,  URETEROSCOPY AND STENT PLACEMENT Left 08/20/2019   Procedure: CYSTOSCOPY, URETEROSCOPY AND STENT PLACEMENT;  Surgeon: Robley Fries, MD;  Location: [facility];  Service: Urology;  Laterality: Left;  1 HR   FOOT SURGERY Right    HIP SURGERY Right 2011   full replacement   HOLMIUM LASER APPLICATION Left [id number]   Procedure: HOLMIUM LASER APPLICATION;  Surgeon: Robley Fries, MD;  Location: [facility];  Service: Urology;  Laterality: Left;   LEFT HEART CATH AND CORONARY ANGIOGRAPHY N/A 08/09/2021   Procedure: LEFT HEART CATH AND CORONARY ANGIOGRAPHY;  Surgeon: Sherren Mocha, MD;  Location: [facility];  Service: Cardiovascular;  Laterality: N/A;   LEG SURGERY Left 06/2012   femur fracture s/p open and closed reduction in [place], Dr. Jimmye Norman   lens replacement Right    right eye   ORIF ELBOW FRACTURE Right 05/09/2017   Procedure: ORIF right olecranon fracture with repair/reconstruction, ulnar nerve transposition as needed;  Surgeon: Roseanne Kaufman, MD;  Location: [facility];  Service: Orthopedics;  Laterality: Right;  Requests 90 mins   pelvis fracture     POLYPECTOMY     REVERSE SHOULDER ARTHROPLASTY Left 06/13/2019   Procedure: REVERSE SHOULDER ARTHROPLASTY;  Surgeon: Justice Britain, MD;  Location: [facility];  Service: Orthopedics;  Laterality: Left;  173mn   SHOULDER ARTHROSCOPY W/ ROTATOR CUFF REPAIR Right    TRAPEZIUM RESECTION Right     Prior to Admission medications   Medication Sig Start Date End Date Taking? Authorizing Provider  acetaminophen (TYLENOL) 500 MG tablet Take 1,000 mg by mouth every 6 (six) hours as needed for mild pain.    Yes [provider]  aspirin 81 MG chewable tablet Chew 81 mg by mouth daily.   Yes [provider]  Budeson-Glycopyrrol-Formoterol (BREZTRI AEROSPHERE) 160-9-4.8 MCG/ACT AERO Inhale 2 puffs into the lungs in the morning and at bedtime. 09/29/21  Yes BCollene Gobble MD  Calcium Carbonate Antacid (TUMS ULTRA 1000 PO) Take  2,000-3,000 mg by mouth at bedtime as needed (acid reflux/indigestion/heartburn).   Yes [provider]  ELIQUIS 2.5 MG TABS tablet TAKE 1 TABLET(2.5 MG) BY MOUTH TWICE DAILY Patient taking differently: Take 2.5 mg by mouth 2 (two) times daily. 01/31/22  Yes CSherren Mocha MD  guaiFENesin (MUCINEX) 600 MG 12 hr tablet Take 2 tablets (1,200 mg total) by mouth 2 (two) times daily. 03/18/22  Yes AAline August MD  ipratropium-albuterol (DUONEB) 0.5-2.5 (3) MG/3ML SOLN Take 3 mLs by nebulization every 6 (six) hours as needed (wheezing; shortness of breath). 10/22/21  Yes Cobb, KKarie Schwalbe NP  lisinopril (ZESTRIL) 20 MG tablet Take 1 tablet (20 mg total) by mouth  daily. 03/19/22  Yes Aline August, MD  metoprolol tartrate (LOPRESSOR) 25 MG tablet Take 1 tablet (25 mg total) by mouth daily as needed (palpitations). 08/23/21  Yes Weaver, [name] T, PA-C  nitroGLYCERIN (NITROSTAT) 0.4 MG SL tablet Place 1 tablet (0.4 mg total) under the tongue every 5 (five) minutes as needed for chest pain. 11/06/19  Yes Weaver, [name] T, PA-C  omeprazole (PRILOSEC) 40 MG capsule Take 40 mg by mouth in the morning and at bedtime.   Yes [provider]  oxybutynin (DITROPAN) 5 MG tablet Take 5 mg by mouth at bedtime. 08/25/20  Yes [provider]  polyethylene glycol (MIRALAX / GLYCOLAX) 17 g packet Take 17 g by mouth daily.   Yes [provider]  sertraline (ZOLOFT) 25 MG tablet Take 25 mg by mouth daily.   Yes [provider]  sertraline (ZOLOFT) 50 MG tablet Take 1 tablet (50 mg total) by mouth at bedtime. 12/23/21  Yes Melvenia Beam, MD  VENTOLIN HFA 108 (90 Base) MCG/ACT inhaler INHALE 2 PUFFS INTO THE LUNGS EVERY 4 HOURS AS NEEDED FOR WHEEZING OR SHORTNESS OF BREATH 02/15/22  Yes Byrum, Rose Fillers, MD  Vibegron (GEMTESA) 75 MG TABS Take 1 tablet by mouth at bedtime.   Yes [provider]  amiodarone (PACERONE) 200 MG tablet Take 1 tablet (200 mg total) by mouth daily for 21  days. Patient not taking: Reported on 04/18/2022 03/19/22 04/09/22  Aline August, MD  predniSONE (DELTASONE) 10 MG tablet '20mg'$  daily for 5 days then '10mg'$  daily for 5 days then stop Patient not taking: Reported on 04/18/2022 03/19/22   Aline August, MD    Current Facility-Administered Medications  Medication Dose Route Frequency Provider Last Rate Last Admin   acetaminophen (TYLENOL) tablet 650 mg  650 mg Oral Q6H PRN Tu, Ching T, DO       amiodarone (NEXTERONE PREMIX) 360-4.14 MG/200ML-% (1.8 mg/mL) IV infusion  30 mg/hr Intravenous Continuous Elgie Congo, MD   Paused at 04/19/22 0551   amiodarone (PACERONE) tablet 200 mg  200 mg Oral BID Fay Records, MD   200 mg at 04/19/22 2133   apixaban (ELIQUIS) tablet 2.5 mg  2.5 mg Oral BID Tu, Ching T, DO   2.5 mg at 04/19/22 2134   Chlorhexidine Gluconate Cloth 2 % PADS 6 each  6 each Topical Daily Shelly Coss, MD   6 each at 04/19/22 1804   dextrose 5 %-0.9 % sodium chloride infusion   Intravenous Continuous Kathryne Eriksson, NP 20 mL/hr at 04/20/22 0512 New Bag at 04/20/22 0512   feeding supplement (ENSURE ENLIVE / ENSURE PLUS) liquid 237 mL  237 mL Oral BID BM Tu, Ching T, DO       ipratropium-albuterol (DUONEB) 0.5-2.5 (3) MG/3ML nebulizer solution 3 mL  3 mL Nebulization Q6H PRN Tu, Ching T, DO       mometasone-formoterol (DULERA) 100-5 MCG/ACT inhaler 2 puff  2 puff Inhalation BID Tu, Ching T, DO       And   umeclidinium bromide (INCRUSE ELLIPTA) 62.5 MCG/ACT 1 puff  1 puff Inhalation Daily Tu, Ching T, DO       Oral care mouth rinse  15 mL Mouth Rinse PRN Shelly Coss, MD       oxybutynin (DITROPAN) tablet 5 mg  5 mg Oral QHS Tu, Ching T, DO   5 mg at 04/19/22 2134   pantoprazole (PROTONIX) injection 40 mg  40 mg Intravenous Q24H Shelly Coss, MD  40 mg at 04/19/22 1801   sertraline (ZOLOFT) tablet 75 mg  75 mg Oral Daily Tu, Ching T, DO   75 mg at 04/19/22 1107   tamsulosin (FLOMAX) capsule 0.4 mg  0.4 mg Oral Daily  Shelly Coss, MD   0.4 mg at 04/19/22 1107    Allergies as of 04/18/2022 - Review Complete 04/18/2022  Allergen Reaction Noted   Levaquin [levofloxacin] Other (See Comments) 06/10/2016   Oxycodone Nausea Only 07/18/2019   Sulfonamide derivatives Other (See Comments) 08/08/2008   Sulfa antibiotics Other (See Comments) 08/08/2021   Morphine Nausea Only 08/08/2008    Family History  Problem Relation Age of Onset   Colon cancer Mother 59   Cancer Mother    Lung cancer Father    Heart disease Father    Arrhythmia Father    Cancer Father    Cancer Sister        ovarian   Heart attack Brother    Esophageal cancer Neg Hx    Rectal cancer Neg Hx    Stomach cancer Neg Hx    Stroke Neg Hx     Social History   Socioeconomic History   Marital status: Widowed    Spouse name: Not on file   Number of children: 1   Years of education: Not on file   Highest education level: Some college, no degree  Occupational History   Occupation: retired    Comment: still works as Scientist, 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  Tobacco Use   Smoking status: Never   Smokeless tobacco: Never  Vaping Use   Vaping Use: Never used  Substance and Sexual Activity   Alcohol use: No   Drug use: No   Sexual activity: Not Currently    Partners: Male    Birth control/protection: Post-menopausal  Other Topics Concern   Not on file  Social History Narrative   Lives with daughter and son in law   decaffeinated  use:  Coffee 1 per day   Right handed   Social Determinants of Health   Financial Resource Strain: Not on file  Food Insecurity: No Food Insecurity (04/19/2022)   Hunger Vital Sign    Worried About Running Out of Food in the Last Year: Never true    Ran Out of Food in the Last Year: Never true  Transportation Needs: No Transportation Needs (04/19/2022)   PRAPARE - Hydrologist (Medical): No    Lack of Transportation (Non-Medical): No  Physical Activity: Not on file   Stress: Not on file  Social Connections: Not on file  Intimate Partner Violence: Not At Risk (04/19/2022)   Humiliation, Afraid, Rape, and Kick questionnaire    Fear of Current or Ex-Partner: No    Emotionally Abused: No    Physically Abused: No    Sexually Abused: No    Review of Systems: Gen: + Weight loss. CV: Denies chest pain, palpitations or edema. Resp: Denies cough, shortness of breath of hemoptysis.  GI: Denies heartburn, dysphagia, stomach or lower abdominal pain. No diarrhea or constipation. No rectal bleeding or melena.   GU : Denies urinary burning, blood in urine, increased urinary frequency or incontinence. MS: Denies joint pain, muscles aches or weakness. Derm: Denies rash, itchiness, skin lesions or unhealing ulcers. Psych: Denies depression, anxiety, memory loss or confusion. Heme: Denies easy bruising, bleeding. Neuro:  Denies headaches, dizziness or paresthesias. Endo:  Denies any problems with DM, thyroid or adrenal function.  Physical Exam:  Vital signs in last 24 hours: Temp:  [98 F (36.7 C)-98.2 F (36.8 C)] 98.2 F (36.8 C) (02/28 0451) Pulse Rate:  [69-81] 75 (02/28 0451) Resp:  [10-21] 18 (02/28 0451) BP: (99-148)/(51-77) 109/53 (02/28 0451) SpO2:  [94 %-99 %] 94 % (02/28 0451) Last BM Date : 04/20/22 General: Somnolent appearing 88 year old female, arousable in no acute distress.  Conversant. Head:  Normocephalic and atraumatic. Eyes:  No scleral icterus. Conjunctiva pink. Ears:  Normal auditory acuity. Nose:  No deformity, discharge or lesions. Mouth:  Dentition intact. No ulcers or lesions.  Neck:  Supple. No lymphadenopathy or thyromegaly.  Lungs: Breath sounds clear throughout. No wheezes, rhonchi or crackles.  Heart: Regular rate and rhythm, no murmurs. Abdomen: Soft, nondistended.  Nontender.  Positive bowel sounds to all 4 quadrants. Rectal: Deferred. Musculoskeletal:  Symmetrical without gross deformities.  Pulses:  Normal pulses  noted. Extremities:  Without clubbing or edema. Neurologic:  Alert and  oriented to name and year.  She stated she is in the hospital due to having trouble breathing. Skin:  Intact without significant lesions or rashes. Psych:  Alert and cooperative. Normal mood and affect.  Intake/Output from previous day: 02/27 0701 - 02/28 0700 In: -  Out: 350 [Urine:350] Intake/Output this shift: No intake/output data recorded.  Lab Results: Recent Labs    04/18/22 1631 04/19/22 0445 04/20/22 0354  WBC 14.8* 14.9* 13.0*  HGB 13.2 12.1 11.7*  HCT 42.9 39.4 37.7  PLT 256 233 221   BMET Recent Labs    04/18/22 1631 04/19/22 0445 04/20/22 0354  NA 138 135 138  K 3.7 3.5 3.7  CL 104 103 104  CO2 '22 24 25  '$ GLUCOSE 65* 99 56*  BUN 24* 23 21  CREATININE 0.87 0.83 0.75  CALCIUM 7.9* 7.7* 8.1*   LFT Recent Labs    04/18/22 1631  PROT 5.3*  ALBUMIN 2.8*  AST 19  ALT 13  ALKPHOS 59  BILITOT 1.1   PT/INR No results for input(s): "LABPROT", "INR" in the last 72 hours. Hepatitis Panel No results for input(s): "HEPBSAG", "HCVAB", "HEPAIGM", "HEPBIGM" in the last 72 hours.    Studies/Results: DG ESOPHAGUS W SINGLE CM (SOL OR THIN BA)  Result Date: 04/19/2022 CLINICAL DATA:  88 year old female failure to thrive. Team is requesting an esophagram for further evaluation EXAM: ESOPHAGUS/BARIUM SWALLOW/TABLET STUDY TECHNIQUE: Single contrast examination was performed using thin liquid barium. This exam was performed by Rushie Nyhan, and was supervised and interpreted by Zetta Bills, MD. FLUOROSCOPY: Radiation Exposure Index (as provided by the fluoroscopic device): 12.8 mGy Kerma COMPARISON:  CT of the chest favor 08/11/2022 and CT of the abdomen and pelvis performed also in April 18, 2022 FINDINGS: Swallowing: Appears normal. No vestibular penetration or aspiration seen. Pharynx: Unremarkable. Esophagus: Dilated esophagus, widely patent into the intrathoracic portion the stomach.  Esophageal motility: Intact primary wave but with incomplete stripping and substantial stasis in the esophagus also reflux from the intrathoracic stomach into the esophagus with abundant stasis in the lumen of the esophagus. 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 Thickened folds in the intrathoracic stomach suspected, not well evaluated on this single contrast upper gastrointestinal imaging study. Note that there is limited passage of administered oral contrast media which is somewhat positional from the intrathoracic into the subdiaphragmatic stomach. Stomach is not distended on the current study below the diaphragm and is not well assessed. Gastroesophageal reflux: Spontaneous severe gastroesophageal reflux from the intrathoracic  stomach into the esophagus. Ingested 63m barium tablet: Not given Other: None. IMPRESSION: Some esophageal dysmotility with abundant stasis of a dilation of the esophagus in the setting of a mixed type hernia which appears to limited passage of contrast from the intrathoracic portion of the stomach into the abdomen. Stomach not well assessed, correlate with any history of pain or vomiting. Widely patent gastroesophageal junction with signs of spontaneous reflux from the intrathoracic stomach into the esophagus. Fold thickening throughout the intrathoracic stomach in this patient with mixed type hiatal hernia should be correlated with symptoms. Potentially related to gastritis but not well assessed on single contrast imaging. Electronically Signed   By: GZetta BillsM.D.   On: 04/19/2022 16:43   CT Angio Chest PE W and/or Wo Contrast  Result Date: 04/18/2022 CLINICAL DATA:  Left-sided pleural effusion as well as abdominal distension. EXAM: CT ANGIOGRAPHY CHEST CT ABDOMEN AND PELVIS WITH CONTRAST TECHNIQUE: Multidetector CT imaging of the chest was performed using the standard protocol during bolus administration  of intravenous contrast. Multiplanar CT image reconstructions and MIPs were obtained to evaluate the vascular anatomy. Multidetector CT imaging of the abdomen and pelvis was performed using the standard protocol during bolus administration of intravenous contrast. RADIATION DOSE REDUCTION: This exam was performed according to the departmental dose-optimization program which includes automated exposure control, adjustment of the mA and/or kV according to patient size and/or use of iterative reconstruction technique. CONTRAST:  873mOMNIPAQUE IOHEXOL 350 MG/ML SOLN COMPARISON:  Chest CT dated 08/10/2021. CT abdomen pelvis dated 07/29/2019. FINDINGS: Evaluation is limited due to respiratory motion as well as due to streak artifact caused by patient's arms and left shoulder arthroplasty. CTA CHEST FINDINGS Cardiovascular: There is no cardiomegaly no pericardial effusion. Mild atherosclerotic calcification of the thoracic aorta. No aneurysmal dilatation or dissection. Evaluation of the pulmonary arteries is limited due to factors above. No pulmonary artery embolus identified. Mediastinum/Nodes: No hilar or mediastinal adenopathy. Small calcified granuloma. There is a small hiatal hernia. Mild thickened appearance of the distal esophagus may represent esophagitis. Clinical correlation is recommended. Ingested content noted within the esophagus which may represent delayed clearance for reflux. Underlying distal esophageal stricture or mass is not excluded. Endoscopy may provide better evaluation if clinically indicated. Lungs/Pleura: Small bilateral pleural effusions, left greater than right and increased since the prior CT. There is partial compressive atelectasis of the lower lobes. Pneumonia is less likely but not excluded. Right lung base linear atelectasis/scarring. There is no pneumothorax. There is a 3 mm right middle lobe nodule or granuloma. The central airways are patent. Musculoskeletal: Total left shoulder  arthroplasty. Osteopenia with degenerative changes of the spine. No acute osseous pathology. Review of the MIP images confirms the above findings. CT ABDOMEN and PELVIS FINDINGS No intra-abdominal free air or free fluid. Hepatobiliary: Several small liver cysts and additional subcentimeter hypodense lesions which are too small to characterize. No biliary ductal dilatation. The gallbladder is unremarkable Pancreas: Unremarkable. No pancreatic ductal dilatation or surrounding inflammatory changes. Spleen: Normal in size without focal abnormality. Adrenals/Urinary Tract: Mild thickened appearance of the medial limb of the right adrenal gland as well as an indeterminate subcentimeter left adrenal nodule similar to 2021, likely a benign adenoma. Mild bilateral renal parenchyma atrophy. There is a 3 mm nonobstructing left renal inferior pole calculus. Small bilateral upper pole cysts. Several additional subcentimeter hypodense lesions are too small to characterize. There is no hydronephrosis on either side. There is symmetric enhancement and excretion of contrast by both kidneys. The  visualized ureters appear unremarkable. The urinary bladder is distended and grossly unremarkable. Stomach/Bowel: Small hiatal hernia. There is moderate stool throughout the colon. Several small scattered sigmoid diverticula without active inflammatory changes. There is no bowel obstruction or active inflammation. The appendix is not visualized with certainty. No inflammatory changes identified in the right lower quadrant. Vascular/Lymphatic: Moderate aortoiliac atherosclerotic disease. The IVC is unremarkable. No portal venous gas. There is no adenopathy. Reproductive: The uterus is poorly visualized. A 2.1 cm left ovarian cyst. No follow-up imaging recommended. Note: This recommendation does not apply to premenarchal patients and to those with increased risk (genetic, family history, elevated tumor markers or other high-risk factors) of  ovarian cancer. Reference: JACR 2020 Feb; 17(2):248-254 Other: None Musculoskeletal: There is a total right hip arthroplasty and prior ORIF of left femoral neck fracture. Old healed bilateral pubic rami fractures. Osteopenia with degenerative changes of the spine. No acute osseous pathology. Review of the MIP images confirms the above findings. IMPRESSION: 1. No CT evidence of pulmonary artery embolus. 2. Small bilateral pleural effusions, left greater than right and increased since the prior CT. No acute intra-abdominal or pelvic pathology. 3. A 3 mm nonobstructing left renal inferior pole calculus. No hydronephrosis. 4. Colonic diverticulosis. No bowel obstruction. 5. Small hiatal hernia. Ingested content within the esophagus may represent delayed clearance for reflux. 6.  Aortic Atherosclerosis (ICD10-I70.0). Electronically Signed   By: Anner Crete M.D.   On: 04/18/2022 19:48   CT ABDOMEN PELVIS W CONTRAST  Result Date: 04/18/2022 CLINICAL DATA:  Left-sided pleural effusion as well as abdominal distension. EXAM: CT ANGIOGRAPHY CHEST CT ABDOMEN AND PELVIS WITH CONTRAST TECHNIQUE: Multidetector CT imaging of the chest was performed using the standard protocol during bolus administration of intravenous contrast. Multiplanar CT image reconstructions and MIPs were obtained to evaluate the vascular anatomy. Multidetector CT imaging of the abdomen and pelvis was performed using the standard protocol during bolus administration of intravenous contrast. RADIATION DOSE REDUCTION: This exam was performed according to the departmental dose-optimization program which includes automated exposure control, adjustment of the mA and/or kV according to patient size and/or use of iterative reconstruction technique. CONTRAST:  50m OMNIPAQUE IOHEXOL 350 MG/ML SOLN COMPARISON:  Chest CT dated 08/10/2021. CT abdomen pelvis dated 07/29/2019. FINDINGS: Evaluation is limited due to respiratory motion as well as due to streak  artifact caused by patient's arms and left shoulder arthroplasty. CTA CHEST FINDINGS Cardiovascular: There is no cardiomegaly no pericardial effusion. Mild atherosclerotic calcification of the thoracic aorta. No aneurysmal dilatation or dissection. Evaluation of the pulmonary arteries is limited due to factors above. No pulmonary artery embolus identified. Mediastinum/Nodes: No hilar or mediastinal adenopathy. Small calcified granuloma. There is a small hiatal hernia. Mild thickened appearance of the distal esophagus may represent esophagitis. Clinical correlation is recommended. Ingested content noted within the esophagus which may represent delayed clearance for reflux. Underlying distal esophageal stricture or mass is not excluded. Endoscopy may provide better evaluation if clinically indicated. Lungs/Pleura: Small bilateral pleural effusions, left greater than right and increased since the prior CT. There is partial compressive atelectasis of the lower lobes. Pneumonia is less likely but not excluded. Right lung base linear atelectasis/scarring. There is no pneumothorax. There is a 3 mm right middle lobe nodule or granuloma. The central airways are patent. Musculoskeletal: Total left shoulder arthroplasty. Osteopenia with degenerative changes of the spine. No acute osseous pathology. Review of the MIP images confirms the above findings. CT ABDOMEN and PELVIS FINDINGS No intra-abdominal free air or free  fluid. Hepatobiliary: Several small liver cysts and additional subcentimeter hypodense lesions which are too small to characterize. No biliary ductal dilatation. The gallbladder is unremarkable Pancreas: Unremarkable. No pancreatic ductal dilatation or surrounding inflammatory changes. Spleen: Normal in size without focal abnormality. Adrenals/Urinary Tract: Mild thickened appearance of the medial limb of the right adrenal gland as well as an indeterminate subcentimeter left adrenal nodule similar to 2021, likely  a benign adenoma. Mild bilateral renal parenchyma atrophy. There is a 3 mm nonobstructing left renal inferior pole calculus. Small bilateral upper pole cysts. Several additional subcentimeter hypodense lesions are too small to characterize. There is no hydronephrosis on either side. There is symmetric enhancement and excretion of contrast by both kidneys. The visualized ureters appear unremarkable. The urinary bladder is distended and grossly unremarkable. Stomach/Bowel: Small hiatal hernia. There is moderate stool throughout the colon. Several small scattered sigmoid diverticula without active inflammatory changes. There is no bowel obstruction or active inflammation. The appendix is not visualized with certainty. No inflammatory changes identified in the right lower quadrant. Vascular/Lymphatic: Moderate aortoiliac atherosclerotic disease. The IVC is unremarkable. No portal venous gas. There is no adenopathy. Reproductive: The uterus is poorly visualized. A 2.1 cm left ovarian cyst. No follow-up imaging recommended. Note: This recommendation does not apply to premenarchal patients and to those with increased risk (genetic, family history, elevated tumor markers or other high-risk factors) of ovarian cancer. Reference: JACR 2020 Feb; 17(2):248-254 Other: None Musculoskeletal: There is a total right hip arthroplasty and prior ORIF of left femoral neck fracture. Old healed bilateral pubic rami fractures. Osteopenia with degenerative changes of the spine. No acute osseous pathology. Review of the MIP images confirms the above findings. IMPRESSION: 1. No CT evidence of pulmonary artery embolus. 2. Small bilateral pleural effusions, left greater than right and increased since the prior CT. No acute intra-abdominal or pelvic pathology. 3. A 3 mm nonobstructing left renal inferior pole calculus. No hydronephrosis. 4. Colonic diverticulosis. No bowel obstruction. 5. Small hiatal hernia. Ingested content within the  esophagus may represent delayed clearance for reflux. 6.  Aortic Atherosclerosis (ICD10-I70.0). Electronically Signed   By: Anner Crete M.D.   On: 04/18/2022 19:48   CT Cervical Spine Wo Contrast  Result Date: 04/18/2022 CLINICAL DATA:  [place], weakness EXAM: CT CERVICAL SPINE WITHOUT CONTRAST TECHNIQUE: Multidetector CT imaging of the cervical spine was performed without intravenous contrast. Multiplanar CT image reconstructions were also generated. RADIATION DOSE REDUCTION: This exam was performed according to the departmental dose-optimization program which includes automated exposure control, adjustment of the mA and/or kV according to patient size and/or use of iterative reconstruction technique. COMPARISON:  None Available. FINDINGS: Alignment: There is mild degenerative retrolisthesis of C3 on C4 and C6 on C7. Otherwise alignment is grossly anatomic. 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 There are no acute or destructive bony abnormalities. Soft tissues and spinal canal: No prevertebral fluid or swelling. No visible canal hematoma. Disc levels: There is severe multilevel spondylosis and facet hypertrophy, greatest at C3-4, C4-5, and C6-7. Marked hypertrophic changes are seen at the C1-C2 interface. Upper chest: Airway is patent. Emphysematous changes are seen at the lung apices. Mildly distended and fluid-filled proximal thoracic esophagus incidentally noted. Other: Reconstructed images demonstrate no additional findings. IMPRESSION: 1. No acute cervical spine fracture. 2. Extensive multilevel degenerative changes. Electronically Signed   By: Randa Ngo M.D.   On: 04/18/2022 19:37   CT Head Wo Contrast  Result Date: 04/18/2022  CLINICAL DATA:  [place], weakness EXAM: CT HEAD WITHOUT CONTRAST TECHNIQUE: Contiguous axial images were obtained from the base of the skull through the vertex without intravenous contrast. RADIATION DOSE  REDUCTION: This exam was performed according to the departmental dose-optimization program which includes automated exposure control, adjustment of the mA and/or kV according to patient size and/or use of iterative reconstruction technique. COMPARISON:  07/18/2019 FINDINGS: Brain: Chronic small vessel ischemic changes are seen throughout the periventricular and subcortical white matter, grossly stable. No evidence of acute infarct or hemorrhage. Lateral ventricles and midline structures are otherwise unremarkable. No acute extra-axial fluid collections. No mass effect. Vascular: No hyperdense vessel or unexpected calcification. Skull: Normal. Negative for fracture or focal lesion. Sinuses/Orbits: Retained secretions within the sphenoid and right maxillary sinuses, with superimposed gas fluid levels. Remaining paranasal sinuses are clear. Other: None. IMPRESSION: 1. No acute intracranial process. 2. Right maxillary and sphenoid sinus disease. Electronically Signed   By: Randa Ngo M.D.   On: 04/18/2022 19:35   DG Chest 2 View  Result Date: 04/18/2022 CLINICAL DATA:  Short of breath and fatigue EXAM: CHEST - 2 VIEW COMPARISON:  Radiograph 03/14/2022 FINDINGS: Normal cardiac silhouette small LEFT effusion noted. Hyperinflated lungs. no focal consolidation. No pneumothorax. Degenerative change of the RIGHT shoulder. Arthroplasty LEFT shoulder. Healed rib fractures on the LEFT. IMPRESSION: 1. New small to moderate LEFT pleural effusion. 2. Hyperinflated lungs without evidence of pneumonia. Electronically Signed   By: Suzy Bouchard M.D.   On: 04/18/2022 16:49    IMPRESSION/PLAN:  88 year old female with a history of GERD who was admitted to the hospital 04/19/2022 with shortness of breath, failure to thrive in afib with RVR. CTAP 2/26 showed possible distal esophageal stricture.  Barium swallow study showed evidence of esophageal dysmotility.  History of pill dysphagia.   -NPO for now -Consider endoscopic  evaluation during this hospitalization on Eliquis, await further recommendations per Dr. Fuller Plan -Continue PPI IV daily -Recommend nutritionist consult  Acute respiratory failure. Small bilateral pleural effusions L > R, recent RSV with COPD exacerbation. Recently diagnosed with aspiration pneumonia treated with antibiotics while in the SNF.  WBC 13.0.  Afebrile. -Management per the medical service  Paroxysmal atrial fibrillation. On Eliquis. CHA2DS2-VASc Score = 6.   Coronary artery disease  Chronic CHF. Echo showed LVEF 60 to 65% with grade 2 diastolic dysfunction.  Mild normocytic anemia, no overt GI bleeding. -CBC and iron panel in a.m.  Noralyn Pick  04/20/2022, 10:10AM    Attending Physician Note   I have taken a history, reviewed the chart and examined the patient. I performed more than 50% of this encounter in conjunction with the APP. I agree with the APP's note, impression and recommendations with my edits. My additional impressions and recommendations are as follows.   *Abnormal CT of the esophagus and abnormal barium esophagram showing esophageal dysmotility without a stricture, with reflux, hiatal hernia, esophageal dilation and stasis. In discussion with her daughter the patient has changed her diet to mainly liquids over the past few weeks and she has difficulty with large pills so she is likely experiencing dysphagia.  *GERD, symptoms not adequately controlled *Afib with RVR, chronic CHF *Severe COPD, bronchiectasis, sarcoidosis, recent RSV infection    *Enhance GERD therapy with pantoprazole 40 mg bid now and at discharge *Full liquid diet for now, attempt to advance to soft diet as tolerated *All meals in fully upright position, elevated HOB 30 degrees at all times  *She is high risk for endoscopic  procedures, anesthesia so I feel the risks outweigh the benefits of EGD.  *No plans for further GI evaluation at this time. Outpatient follow up with Dr. Scarlette Shorts in 1 month.   Lucio Edward, MD [facility] See AM[facility], [facility], for our on call provider

## 2022-04-20 NOTE — Plan of Care (Signed)
  Problem: Education: Goal: Knowledge of General Education information will improve Description: Including pain rating scale, medication(s)/side effects and non-pharmacologic comfort measures 04/20/2022 2017 by Jerene Pitch, RN Outcome: Progressing 04/20/2022 2016 by Jerene Pitch, RN Outcome: Progressing   Problem: Health Behavior/Discharge Planning: Goal: Ability to manage health-related needs will improve 04/20/2022 2017 by Jerene Pitch, RN Outcome: Progressing 04/20/2022 2016 by Jerene Pitch, RN Outcome: Progressing

## 2022-04-21 DIAGNOSIS — K219 Gastro-esophageal reflux disease without esophagitis: Secondary | ICD-10-CM | POA: Diagnosis not present

## 2022-04-21 DIAGNOSIS — Z515 Encounter for palliative care: Secondary | ICD-10-CM

## 2022-04-21 DIAGNOSIS — J449 Chronic obstructive pulmonary disease, unspecified: Secondary | ICD-10-CM | POA: Diagnosis not present

## 2022-04-21 DIAGNOSIS — Z7189 Other specified counseling: Secondary | ICD-10-CM

## 2022-04-21 DIAGNOSIS — R338 Other retention of urine: Secondary | ICD-10-CM | POA: Diagnosis not present

## 2022-04-21 DIAGNOSIS — I5032 Chronic diastolic (congestive) heart failure: Secondary | ICD-10-CM | POA: Diagnosis not present

## 2022-04-21 DIAGNOSIS — I4891 Unspecified atrial fibrillation: Secondary | ICD-10-CM | POA: Diagnosis not present

## 2022-04-21 DIAGNOSIS — E440 Moderate protein-calorie malnutrition: Secondary | ICD-10-CM

## 2022-04-21 LAB — BASIC METABOLIC PANEL WITH GFR
Anion gap: 8 (ref 5–15)
BUN: 16 mg/dL (ref 8–23)
CO2: 23 mmol/L (ref 22–32)
Calcium: 7.7 mg/dL — ABNORMAL LOW (ref 8.9–10.3)
Chloride: 103 mmol/L (ref 98–111)
Creatinine, Ser: 0.73 mg/dL (ref 0.44–1.00)
GFR, Estimated: >60 mL/min
Glucose, Bld: 79 mg/dL (ref 70–99)
Potassium: 3.2 mmol/L — ABNORMAL LOW (ref 3.5–5.1)
Sodium: 134 mmol/L — ABNORMAL LOW (ref 135–145)

## 2022-04-21 MED ORDER — PANTOPRAZOLE SODIUM 40 MG PO TBEC
40.0000 mg | DELAYED_RELEASE_TABLET | Freq: Two times a day (BID) | ORAL | Status: DC
  Administered 2022-04-21 – 2022-04-24 (×6): 40 mg via ORAL
  Filled 2022-04-21 (×6): qty 1

## 2022-04-21 MED ORDER — KCL IN DEXTROSE-NACL 40-5-0.45 MEQ/L-%-% IV SOLN
INTRAVENOUS | Status: DC
  Filled 2022-04-21 (×2): qty 1000

## 2022-04-21 MED ORDER — POTASSIUM CHLORIDE 20 MEQ PO PACK
40.0000 meq | PACK | Freq: Once | ORAL | Status: AC
  Administered 2022-04-21: 40 meq via ORAL
  Filled 2022-04-21: qty 2

## 2022-04-21 NOTE — Progress Notes (Signed)
[facility] PROGRESS NOTE  [name] (DOB: 30-Oct-1934) [id number] PCP: Garwin Brothers, MD  Brief Narrative: [name] is an 88 year old female with history of A-fib, PSVT, sarcoidosis, scoliosis, hypertension, HLD, COPD/bronchiectasis, coronary artery disease, diastolic CHF, GERD who presented from nursing facility with complaint of weakness, fatigue, decreased appetite.  She was recently admitted here for COPD exacerbation with RSV and was discharged on oxygen supplementation.  During that time, hospital course was completed by A-fib with RVR and was on amiodarone.  Patient having gradual decline.  She was also treated for aspiration pneumonia in the facility.  On presentation, she was tachycardic in the range of 140, rhythm showed A-fib, she was mildly hypertensive.  Lab work showed WBC of 14.8. Cardiology consulted. GI consulted for concern of esophageal dysmotility/stricture/hiatal hernia. Palliative care also consulted.  Subjective: Resting quietly but awakens to voice. Says she's still just sleepy. No pain or other complaints at all. No family at bedside this AM.  Objective: BP 112/60 (BP Location: Left Arm)   Pulse 68   Temp 98 F (36.7 C) (Oral)   Resp 19   Ht '5\' 3"'$  (1.6 m)   Wt 58 kg   LMP 02/22/1983 (Approximate)   SpO2 95%   BMI 22.65 kg/m   Gen: Frail elderly female in no distress Pulm: Clear, nonlabored  CV: RRR, no pitting edema GI: Soft, NT, ND, +BS  Neuro: Alert and interactive when awake. No new focal deficits. Ext: Warm, no deformities. Skin: No rashes, lesions or ulcers on visualized skin   Assessment & Plan: Principal Problem:   Atrial fibrillation with RVR (HCC) Active Problems:   CAD (coronary artery disease)   Leukocytosis   Chronic heart failure with preserved ejection fraction (HCC)   COPD (chronic obstructive pulmonary disease) (HCC)   GERD (gastroesophageal reflux disease)   Hypotension   Failure to thrive in adult   Chronic  hypoxic respiratory failure (HCC)   Acute urinary retention   A-fib (HCC)   Malnutrition of moderate degree  A-fib with RVR:  - Converted back to NSR with amiodarone. No BP room for BB/CCB. Risk of decompensation that accompanies AFib with RVR seems currently to justify risk of pulmonary toxicity with longer term amiodarone. D/w Dr. Harrington Challenger this morning. - Low dose eliquis continued. Could consider discontinuing this, though she has converted recently and is not bleeding.   Chronic hypoxic respiratory failure/COPD: History of COPD, pulmonary sarcoidosis, and recent admission for RSV. Has been on 3L since that time, though may not need that much.  - Titrate down from oxygen as tolerated.     Suspected esophageal stenosis:  - Continue diet as tolerated. SLP evaluation > can have up to dysphagia 3, though may opt for full liquids only. - GI consulted, risk of endoscopy felt to be prohibitive.    Chronic diastolic CHF: Appears euvolemic. CT chest showed increasing bilateral pleural effusion worse on the left.   - Diuretics on hold due to soft blood pressure.   Coronary artery disease: No anginal symptoms. - DC aspirin since on anticoagulation    Leukocytosis: chronically elevated. No current infectious signs/symptoms. UA without leuk esterase.  - ?if related to silent microaspiration. Will monitor clinically without antibiotics at this time.    GERD:  - Continue PPI.   Hypokalemia:  - Supplement and monitor   Acute urinary retention: Has history of same, being treated for urinary incontinence with oxybutynin. Found to have 1L retention this admission - Continue foley. Stopped oxybutynin (last  dose 2/28). Plan voiding trial 3/1 to allow antimuscarinic washout. Started tamsulosin.  - Follow up with Dr. Claudia Desanctis regardless, may need foley at DC.   Generalized decline/failure to thrive, moderate protein calorie malnutrition: Increasing frequency of admissions and family notes she is at times  sleeping all day, wants curtains drawn, declining to work with PT. Has no appetite and when she does eat, it's just fluids mostly. CT head/spine, CTA chest done on presentation did not show any infectious or metabolic cause for decline.  - RD consulted, will supplement protein as able - Discussed percutaneous gastrostomy with pt/family, specifically the risks this would present particularly in light of her anatomy and that this would not mediate her risk of aspiration which remains high. They are very understanding.  - Family amenable to having meeting with palliative care to discuss current and future goals of care.  Hypoglycemia:  - Push oral intake.  - Check cortisol in AM  Patrecia Pour, MD [facility] [email or web address] 04/21/2022, 12:06 PM

## 2022-04-21 NOTE — Progress Notes (Addendum)
Speech Language Pathology Treatment: Dysphagia  Patient Details Name: [name] MRN: [id number] DOB: 09/03/1934 Today's Date: 04/21/2022 Time: [id number] SLP Time Calculation (min) (ACUTE ONLY): 22 min  Assessment / Plan / Recommendation Clinical Impression  Pt seen for dysphagia f/u session with mod I verbal cues provided for small sips (although pt prefers small sips/bites per report) with intake of various consistencies including: thin via cup/straw, puree and soft solids with adequate oral preparation, propulsion and timely swallow observed.  No overt s/sx of aspiration noted throughout limited PO intake. Pt stated recent decreased satiety which may impact overall function d/t limited intake.  Pt with hx of GERD and prominent cricopharyngeus muscle/mod tertiary contractions per chart review which may place her at risk for esophageal concerns if esophageal precautions are not strictly adhered to during PO intake.  Pt denotes small sips/bites at baseline and requiring for "larger pills to be split" prior to consumption.  Recommend if medically able to progress pt diet to mechanical soft/thin liquids with esophageal precautions in place.  ST will s/o in this setting as pt appears to be functioning at baseline level.  Please re-consult prn for swallowing if symptoms persist/worsen.  Thank you for allowing [place] to care for this pt during her acute stay.  ST will s/o at this time.   HPI HPI: Per MD notes "88 y.o. female with medical history significant of A-fib on amiodarone and metoprolol, PSVT, pulmonary sarcoid, scoliosis, HLD, HTN, COPD, bronchiectasis, CAD, low back pain, anemia, GERD, cognitive deficits, diastolic CHF presented with non-specific symptoms of weakness, fatigue and decrease appetite. Failure to thrive in adult -pt was discharged to SNF at the end of January after hospitalization for COPD exacerbation/A.fib with RVR and has continued to decline -extension workup today with CT head,  C-spine, CTA chest/abd/pelvis, UA and TSH without infectious or metabolic cause to her decline. Unfortunately, this could be more rapid decline of her health due to normal course of aging in the setting of her worsening comorbities" per MD note. Esophagitis and reflux on CT abd with concern for potential narrowing or mass. Prior UGI 2012 showed "Significant gastroesophageal reflux. Patulous GE junction. No hiatal hernia. 2. Barium pill passes into the stomach without delay. 3. Prominent cricopharyngeus muscle. Moderate tertiary contractions. 4. Small polypoid defects in the body and antrum of the stomach may represent small gastric polyps."; ST completed BSE recommending clear liquids and advance as able medically; pt currently on nectar-thickened full liquids.  ST f/u for diet progression/tolerance.      SLP Plan  Discharge SLP treatment due to (comment) (goals achieved)      Recommendations for follow up therapy are one component of a multi-disciplinary discharge planning process, led by the attending physician.  Recommendations may be updated based on patient status, additional functional criteria and insurance authorization.    Recommendations  Diet recommendations: Dysphagia 3 (mechanical soft);Thin liquid Liquids provided via: Cup;Straw Medication Administration: Whole meds with liquid (unless larger, whole/liquid split or crushed) Supervision: Patient able to self feed;Intermittent supervision to cue for compensatory strategies Compensations: Slow rate;Small sips/bites;Follow solids with liquid Postural Changes and/or Swallow Maneuvers: Seated upright 90 degrees                Oral Care Recommendations: Oral care BID;Patient independent with oral care Follow Up Recommendations: No SLP follow up Assistance recommended at discharge: Intermittent Supervision/Assistance SLP Visit Diagnosis: Dysphagia, unspecified (R13.10) Plan: Discharge SLP treatment due to (comment) (goals  achieved)  [name],M.S., CCC-SLP  04/21/2022, 11:28 AM

## 2022-04-21 NOTE — Consult Note (Signed)
Consultation Note Date: 04/21/2022   Patient Name: [name]  DOB: 02-25-1934  MRN: [id number]  Age / Sex: 88 y.o., female   PCP: Garwin Brothers, MD Referring Physician: Patrecia Pour, MD  Reason for Consultation: Establishing goals of care     Chief Complaint/History of Present Illness:   Patient is an 88 year old female with a past medical history of A-fib, PSVT, sarcoidosis, scoliosis, hypertension, hyperlipidemia, COPD/bronchiectasis, CAD, HFpEF, and GERD who was admitted on 04/18/2022 from a nursing facility for management of weakness, fatigue, and decreased appetite.  Patient had recently been admitted for COPD exacerbation with positive RSV.  During this admission, patient was found to be in A-fib with RVR and required medication for management.  Patient noted to have suspected esophageal stenosis and so GI was consulted.  No EGD recommended by GI at this time due to concerns for risk over benefits of procedure.  Also needing management for acute urinary retention requiring Foley placement.  Palliative medicine team consulted to assist with complex medical decision making.  Extensive review of EMR prior to seeing patient.  Presented to bedside to meet with patient.  Upon presentation, multiple family members present.  Patient gave permission for this provider to speak at that time with her family members present.  Family present included daughter-[name] Gerilyn Nestle, son-in-law -Mariah Milling, and grandson Dorothea Ogle.  Patient notes she likes to go by "Posey Pronto".  Introduced myself and the role of the palliative medicine team.  Spent time exploring patient's medical history.  Patient is easily able to participate in conversation though not able to detail her medical history.  Patient notes all the information she has been hearing can be overwhelming which is why she greatly appreciates her family being able to assist with this.  Able to discuss patient's current medical conditions including what was  found during this admission such as rapid heart rate requiring medications for control, etc. Patient acknowledged hearing this.  We discussed patient's life prior to admission.  Patient had been in rehab attempting to get stronger.  Family offered that patient had lived with daughter and her husband prior to rehab admission.  His health has been deteriorating, especially over the past few weeks.  Patient and daughter noted that patient has not wanted to eat for weeks.  Patient notes she just has not "felt hungry".  At this time though patient notes she wants some soup and Jell-O and is feeling hungry.  Encouraged patient eating when she felt like it.  Patient is on dysphagia diet due to esophageal stenosis which family knows.  We also normalized that as 1 gets older and body starts to slow down, it is normal to not feel as hungry and food does not have to be forced.  Patient and family acknowledged this as well.  We also discussed that a PEG tube would not be recommended in patient's case as would not decrease risk of aspiration and would likely cause more risk than benefit.  Patient and family acknowledged this as well and that patient would not want a PEG tube placement.  Patient would want to be allowed to eat as it is a source of joy.  Acknowledged and respected patient's wishes regarding this.  Inquired what patient was hoping for moving forward.  Patient does want to work to regain as much strength as possible.  Patient wants to work with PT and return to rehab.  Acknowledged patient's wishes for this and encouraged continued work with PT.  We also discussed  while hoping for patient's improvement in functional status, should also plan discussions to prepare for if this does not occur.  Patient and family noted patient has completed ACP documentation.  Documentation not on file in this ER.  Patient noted that should she not be able to make medical decisions for herself, her daughter Ursula Beath, would be  her primary medical decision-maker. She would want her family to make decisions.  Patient and family confirmed DNR/DNI status.  Did take time to explain this status to patient at family's request and patient again confirmed DNR/DNI status.  Was able to provide patient and family with MOST form to review for potential completion in the future.  Discussed different aspects of MOST form as applies to patient's care.  We also discussed that should patient not do well in rehab or decides that coming back to the hospital does no longer benefits her quality of life, would then recommend consideration of hospice at home to support patient's symptom management at the end of life as well as family support.  Patient and family acknowledged this.  Spent time providing emotional support and answering questions as able.  Noted palliative medicine team would continue to check in as needed. Recommend outpatient palliative follow up to continue conversations based on how patient is doing; patient and family agreeing with.  Family voiced appreciation for conversation today.  Primary Diagnoses  Present on Admission:  Atrial fibrillation with RVR (HCC)  CAD (coronary artery disease)  Chronic heart failure with preserved ejection fraction (HCC)  COPD (chronic obstructive pulmonary disease) (HCC)  GERD (gastroesophageal reflux disease)  Leukocytosis  A-fib (HCC)   Palliative Review of Systems: Denies any symptoms of concern at this time  Past Medical History:  Diagnosis Date   Acute maxillary sinusitis 10/17/2017   Acute posthemorrhagic anemia 08/22/2012   Acute renal failure syndrome ([place]) 01/04/2019   Allergy    SEASONAL   Anemia    Arthritis    Asthma    "slight"    Baker's cyst of knee, right    CAD (coronary artery disease)    a. Myoview 10/15 - normal EF 70% // Myoview 11/16: EF 75%, normal perfusion, low risk // c. LHC 3/17 - LAD irregs, oLCx 70 (neg FFR), mRCA 30, EF 55-65% >> med Rx // Myoview  07/2018:  EF 73, extracardiac uptake, no significant ischemia (reviewed with Dr. Burt Knack), Low Risk // Myoview 6/22: EF 72, normal perfusion, low risk   Carotid stenosis    a. Carotid US 9/15 - Bilateral 1-39% ICA >> FU 2 years // b. Bilateral ICA 1-39 >> FU prn // Carotid US 06/2018: bilat 1-39; fu prn   Closed fracture of right olecranon process 05/09/2017   Closed left subtrochanteric femur fracture S/P Open/closed and reduction, internal medullary fixation  09/05/2012   Dyspnea    due to Sarcoidosis. When is windy or humed   Dysrhythmia    fast heart rate   Edema 10/30/2019   Elbow fracture 04/2017   Right, had surgery   Esophageal reflux    Fracture of inferior pubic ramus ([place]) 02/28/2019   Hematochezia    Hip fracture ([place]) 12/12/2018   History of echocardiogram    a. Echo 10/15 - EF 60-65%, no RWMA   HLD (hyperlipidemia)    Hx of colonoscopy    Hydronephrosis 07/29/2019   Left foot pain 09/02/2014   Assessment: 88 year old with acquired leg length discrepancy on the left who presents with 10 days of acute onset left  foot pain. She seemed to be doing well prior to this pain with her orthotics that had been made to correct her leg length discrepancy. Negative x-rays at outside office which were again reviewed today.   This pain does not seem to impair her function and which has actually been im   Macular pucker, right eye    will have removed on 01/26/21   Osteoporosis    Other chronic pulmonary heart diseases    Paroxysmal supraventricular tachycardia    Pneumonia    Pneumonia 09/18/2019   Pre-op evaluation 06/11/2019   Pyelonephritis 07/29/2019   Sacral insufficiency fracture 12/12/2018   Sarcoidosis    Sepsis ([place]) 07/30/2019   Sepsis due to Escherichia coli (e. coli) ([place]) 07/29/2019   Sepsis due to urinary tract infection ([place]) 07/18/2019   Social History   Socioeconomic History   Marital status: Widowed    Spouse name: Not on file   Number of children: 1   Years of  education: Not on file   Highest education level: Some college, no degree  Occupational History   Occupation: retired    Comment: still works as Scientist, 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  Tobacco Use   Smoking status: Never   Smokeless tobacco: Never  Vaping Use   Vaping Use: Never used  Substance and Sexual Activity   Alcohol use: No   Drug use: No   Sexual activity: Not Currently    Partners: Male    Birth control/protection: Post-menopausal  Other Topics Concern   Not on file  Social History Narrative   Lives with daughter and son in law   decaffeinated  use:  Coffee 1 per day   Right handed   Social Determinants of Health   Financial Resource Strain: Not on file  Food Insecurity: No Food Insecurity (04/20/2022)   Hunger Vital Sign    Worried About Running Out of Food in the Last Year: Never true    Ran Out of Food in the Last Year: Never true  Transportation Needs: No Transportation Needs (04/20/2022)   PRAPARE - Hydrologist (Medical): No    Lack of Transportation (Non-Medical): No  Physical Activity: Not on file  Stress: Not on file  Social Connections: Not on file   Family History  Problem Relation Age of Onset   Colon cancer Mother 57   Cancer Mother    Lung cancer Father    Heart disease Father    Arrhythmia Father    Cancer Father    Cancer Sister        ovarian   Heart attack Brother    Esophageal cancer Neg Hx    Rectal cancer Neg Hx    Stomach cancer Neg Hx    Stroke Neg Hx    Scheduled Meds:  amiodarone  200 mg Oral BID   apixaban  2.5 mg Oral BID   Chlorhexidine Gluconate Cloth  6 each Topical Daily   feeding supplement  237 mL Oral BID BM   mometasone-formoterol  2 puff Inhalation BID   And   umeclidinium bromide  1 puff Inhalation Daily   multivitamin with minerals  1 tablet Oral Daily   pantoprazole (PROTONIX) IV  40 mg Intravenous Q12H   potassium chloride  40 mEq Oral Once   sertraline  75 mg Oral Daily    tamsulosin  0.4 mg Oral Daily   Continuous Infusions:  amiodarone Stopped (04/19/22 0551)   PRN Meds:.acetaminophen, ipratropium-albuterol, mouth  rinse Allergies  Allergen Reactions   Levaquin [Levofloxacin] Other (See Comments)    Joint pain.. Per doctor not to take again   Oxycodone Nausea Only    Can take oxycodone with Zofran Other reaction(s): vomiting   Sulfonamide Derivatives Other (See Comments)    Possibly caused hepatitis   Sulfa Antibiotics Other (See Comments)    Possibly caused hepatitis   Morphine Nausea Only   CBC:    Component Value Date/Time   WBC 13.0 (H) 04/20/2022 0354   HGB 11.7 (L) 04/20/2022 0354   HGB 12.7 07/21/2020 1315   HCT 37.7 04/20/2022 0354   HCT 38.4 07/21/2020 1315   PLT 221 04/20/2022 0354   PLT 309 07/21/2020 1315   MCV 89.8 04/20/2022 0354   MCV 94 07/21/2020 1315   NEUTROABS 11.5 (H) 04/18/2022 1631   LYMPHSABS 1.6 04/18/2022 1631   MONOABS 0.7 04/18/2022 1631   EOSABS 0.0 04/18/2022 1631   BASOSABS 0.1 04/18/2022 1631   Comprehensive Metabolic Panel:    Component Value Date/Time   NA 134 (L) 04/21/2022 0341   NA 140 08/26/2020 0916   K 3.2 (L) 04/21/2022 0341   CL 103 04/21/2022 0341   CO2 23 04/21/2022 0341   BUN 16 04/21/2022 0341   BUN 21 08/26/2020 0916   CREATININE 0.73 04/21/2022 0341   CREATININE 0.85 05/18/2015 1028   GLUCOSE 79 04/21/2022 0341   CALCIUM 7.7 (L) 04/21/2022 0341   AST 19 04/18/2022 1631   ALT 13 04/18/2022 1631   ALKPHOS 59 04/18/2022 1631   BILITOT 1.1 04/18/2022 1631   BILITOT 0.3 07/21/2020 1315   PROT 5.3 (L) 04/18/2022 1631   PROT 5.8 (L) 07/21/2020 1315   ALBUMIN 2.8 (L) 04/18/2022 1631   ALBUMIN 4.0 07/21/2020 1315    Physical Exam: Vital Signs: BP 112/60 (BP Location: Left Arm)   Pulse 68   Temp 98 F (36.7 C) (Oral)   Resp 19   Ht '5\' 3"'$  (1.6 m)   Wt 58 kg   LMP 02/22/1983 (Approximate)   SpO2 95%   BMI 22.65 kg/m  SpO2: SpO2: 95 % O2 Device: O2 Device: Room Air O2 Flow  Rate: O2 Flow Rate (L/min): 3 L/min Intake/output summary:  Intake/Output Summary (Last 24 hours) at 04/21/2022 [id number] Last data filed at 04/21/2022 [id number] Gross per 24 hour  Intake 238 ml  Output 325 ml  Net -87 ml   LBM: Last BM Date : 04/20/22 Baseline Weight: Weight: 58 kg Most recent weight: Weight: 58 kg  General: NAD, alert, pleasant,, chronically ill-appearing Eyes: No drainage noted HENT: moist mucous membranes Cardiovascular: RRR Respiratory: no increased work of breathing noted, not in respiratory distress Abdomen: not distended Extremities: Muscle wasting present in all extremities Skin: no rashes or lesions on visible skin; review of EMR notes stage III sacral wound present on admission Neuro: Alert, oriented, following commands easily, cannot speak in detail to medical history though can participate in conversation appropriately Psych: pleasant  Palliative Performance Scale: 30%              Additional Data Reviewed: Recent Labs    04/19/22 0445 04/20/22 0354 04/21/22 0341  WBC 14.9* 13.0*  --   HGB 12.1 11.7*  --   PLT 233 221  --   NA 135 138 134*  BUN '23 21 16  '$ CREATININE 0.83 0.75 0.73    Imaging: DG ESOPHAGUS W SINGLE CM (SOL OR THIN BA) CLINICAL DATA:  88 year old female failure to thrive. Team is  requesting an esophagram for further evaluation  EXAM: ESOPHAGUS/BARIUM SWALLOW/TABLET STUDY  TECHNIQUE: Single contrast examination was performed using thin liquid barium. This exam was performed by Rushie Nyhan, and was supervised and interpreted by Zetta Bills, MD.  FLUOROSCOPY: Radiation Exposure Index (as provided by the fluoroscopic device): 12.8 mGy Kerma  COMPARISON:  CT of the chest favor 08/11/2022 and CT of the abdomen and pelvis performed also in April 18, 2022  FINDINGS: Swallowing: Appears normal. No vestibular penetration or aspiration seen.  Pharynx: Unremarkable.  Esophagus: Dilated esophagus, widely patent into the  intrathoracic portion the stomach.  Esophageal motility: Intact primary wave but with incomplete stripping and substantial stasis in the esophagus also reflux from the intrathoracic stomach into the esophagus with abundant stasis in the lumen of the esophagus.  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  Thickened folds in the intrathoracic stomach suspected, not well evaluated on this single contrast upper gastrointestinal imaging study. Note that there is limited passage of administered oral contrast media which is somewhat positional from the intrathoracic into the subdiaphragmatic stomach. Stomach is not distended on the current study below the diaphragm and is not well assessed.  Gastroesophageal reflux: Spontaneous severe gastroesophageal reflux from the intrathoracic stomach into the esophagus.  Ingested 37m barium tablet: Not given  Other: None.  IMPRESSION: Some esophageal dysmotility with abundant stasis of a dilation of the esophagus in the setting of a mixed type hernia which appears to limited passage of contrast from the intrathoracic portion of the stomach into the abdomen. Stomach not well assessed, correlate with any history of pain or vomiting.  Widely patent gastroesophageal junction with signs of spontaneous reflux from the intrathoracic stomach into the esophagus.  Fold thickening throughout the intrathoracic stomach in this patient with mixed type hiatal hernia should be correlated with symptoms. Potentially related to gastritis but not well assessed on single contrast imaging.  Electronically Signed   By: GZetta BillsM.D.   On: 04/19/2022 16:43    I personally reviewed recent imaging.   Palliative Care Assessment and Plan Summary of Established Goals of Care and Medical Treatment Preferences   Patient is an 88year old female with a past medical history of A-fib, PSVT,  sarcoidosis, scoliosis, hypertension, hyperlipidemia, COPD/bronchiectasis, CAD, HFpEF, and GERD who was admitted on 04/18/2022 from a nursing facility for management of weakness, fatigue, and decreased appetite.  Patient had recently been admitted for COPD exacerbation with positive RSV.  During this admission, patient was found to be in A-fib with RVR and required medication for management.  Patient noted to have suspected esophageal stenosis and so GI was consulted.  No EGD recommended by GI at this time due to concerns for risk over benefits of procedure.  Also needing management for acute urinary retention requiring Foley placement.  Palliative medicine team consulted to assist with complex medical decision making.  # Complex medical decision making/goals of care  -Extensive discussion held with patient while family at bedside as described above in HPI.  Spent time reviewing possible pathways for medical care moving forward.  Patient at this time hoping to regain as much strength as possible and willing to return to rehab to do so.  Discussed that in the future should patient determined that she no longer wants to participate in activities such as rehab or determines that coming back to the hospital does not benefit her quality of life, could then focus pathway on symptom management at the end  of life.  Did mention hospice would likely be available in that setting.  -Also provided family with [facility] form to review. Noted could be completed by provider at later time.  -Should patient be unable to participate in medical decision making, she voiced that she would want her daughter- Ursula Beath to be the primary medical decision maker on her behalf.  Wants family involved with medical decisions if she cannot make them.  -  Code Status: DNR/DNI    -Confirmed during conversation today  # Symptom management  Decreased appetite  Normalized decreased appetite with increased age and multimorbidity.   Did discuss idea of appetite stimulant though medications have risk and benefits and may not lead to long-term benefits for patient.  Patient and family do not want to start on appetite stimulant at this time which this provider is supportive of as a geriatrician.  We also discussed how artificial feeding via tube would not decrease patient's risk for aspiration and would decrease patient's quality of life.  Patient and family agreeing with this and not wanting artificial feeding.  # Psycho-social/Spiritual Support:  - Support System: Daughter, son-in-law, grandson children  # Discharge Planning:  [place] for rehab with Palliative care service follow-up  -[facility] place [facility] referral to assist with outpatient palliative medicine follow-up.  Thank you for allowing the palliative care team to participate in the care [name].  Chelsea Aus, DO Palliative Care Provider PMT # [phone]  This provider spent a total of 83 minutes providing patient's care.  Includes review of EMR, discussing care with other staff members involved in patient's medical care, obtaining relevant history and information from patient and/or patient's family, and personal review of imaging and lab work. Greater than 50% of the time was spent counseling and coordinating care related to the above assessment and plan.

## 2022-04-21 NOTE — Progress Notes (Signed)
Rounding Note    Patient Name: [name] Date of Encounter: 04/21/2022  [place] Cardiologist: Sherren Mocha, MD   Subjective   Patient sleeping soundly, even with exam did not wake up   Inpatient Medications    Scheduled Meds:  amiodarone  200 mg Oral BID   apixaban  2.5 mg Oral BID   Chlorhexidine Gluconate Cloth  6 each Topical Daily   feeding supplement  237 mL Oral BID BM   mometasone-formoterol  2 puff Inhalation BID   And   umeclidinium bromide  1 puff Inhalation Daily   multivitamin with minerals  1 tablet Oral Daily   pantoprazole (PROTONIX) IV  40 mg Intravenous Q12H   potassium chloride  40 mEq Oral Once   sertraline  75 mg Oral Daily   tamsulosin  0.4 mg Oral Daily   Continuous Infusions:  amiodarone Stopped (04/19/22 0551)   PRN Meds: acetaminophen, ipratropium-albuterol, mouth rinse   Vital Signs    Vitals:   04/20/22 0849 04/20/22 1304 04/20/22 2224 04/21/22 0500  BP:  (!) 104/42 (!) 105/53 112/60  Pulse:  80 68 68  Resp:  19    Temp:  98.4 F (36.9 C) 98.9 F (37.2 C) 98 F (36.7 C)  TempSrc:  Oral Oral Oral  SpO2: 94% 93% 95%   Weight:      Height:        Intake/Output Summary (Last 24 hours) at 04/21/2022 0827 Last data filed at 04/21/2022 [id number] Gross per 24 hour  Intake 238 ml  Output 325 ml  Net -87 ml      04/18/2022    4:05 PM 03/18/2022    5:00 AM 03/17/2022    5:00 AM  Last 3 Weights  Weight (lbs) 127 lb 13.9 oz 129 lb 3 oz 126 lb 15.8 oz  Weight (kg) 58 kg 58.6 kg 57.6 kg      Telemetry    SR 60s- Personally Reviewed  ECG    No new  - Personally Reviewed  Physical Exam   GEN: Frail 88yo in NAD  Neck: JVP is not elevated  Cardiac: RRR, no murmurs Respiratory: Clear to auscultation bilaterally.anteriorly  GI: Soft, nontender, non-distended  MS: No edema;    Labs    High Sensitivity Troponin:   Recent Labs  Lab 04/18/22 1631  TROPONINIHS 22*     Chemistry Recent Labs  Lab  04/18/22 1631 04/19/22 0445 04/20/22 0354 04/21/22 0341  NA 138 135 138 134*  K 3.7 3.5 3.7 3.2*  CL 104 103 104 103  CO2 '22 24 25 23  '$ GLUCOSE 65* 99 56* 79  BUN 24* '23 21 16  '$ CREATININE 0.87 0.83 0.75 0.73  CALCIUM 7.9* 7.7* 8.1* 7.7*  PROT 5.3*  --   --   --   ALBUMIN 2.8*  --   --   --   AST 19  --   --   --   ALT 13  --   --   --   ALKPHOS 59  --   --   --   BILITOT 1.1  --   --   --   GFRNONAA >60 >60 >60 >60  ANIONGAP '12 8 9 8    '$ Lipids No results for input(s): "CHOL", "TRIG", "HDL", "LABVLDL", "LDLCALC", "CHOLHDL" in the last 168 hours.  Hematology Recent Labs  Lab 04/18/22 1631 04/19/22 0445 04/20/22 0354  WBC 14.8* 14.9* 13.0*  RBC 4.67 4.32 4.20  HGB 13.2 12.1  11.7*  HCT 42.9 39.4 37.7  MCV 91.9 91.2 89.8  MCH 28.3 28.0 27.9  MCHC 30.8 30.7 31.0  RDW 18.2* 18.1* 18.1*  PLT 256 233 221   Thyroid  Recent Labs  Lab 04/18/22 1631  TSH 1.296    BNP Recent Labs  Lab 04/18/22 1631  BNP 290.4*    DDimer No results for input(s): "DDIMER" in the last 168 hours.   Radiology    DG ESOPHAGUS W SINGLE CM (SOL OR THIN BA)  Result Date: 04/19/2022 CLINICAL DATA:  88 year old female failure to thrive. Team is requesting an esophagram for further evaluation EXAM: ESOPHAGUS/BARIUM SWALLOW/TABLET STUDY TECHNIQUE: Single contrast examination was performed using thin liquid barium. This exam was performed by Rushie Nyhan, and was supervised and interpreted by Zetta Bills, MD. FLUOROSCOPY: Radiation Exposure Index (as provided by the fluoroscopic device): 12.8 mGy Kerm[place] COMPARISON:  CT of the chest favor 08/11/2022 and CT of the abdomen and pelvis performed also in April 18, 2022 FINDINGS: Swallowing: Appears normal. No vestibular penetration or aspiration seen. Pharynx: Unremarkable. Esophagus: Dilated esophagus, widely patent into the intrathoracic portion the stomach. Esophageal motility: Intact primary wave but with incomplete stripping and substantial  stasis in the esophagus also reflux from the intrathoracic stomach into the esophagus with abundant stasis in the lumen of the esophagus. 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 Thickened folds in the intrathoracic stomach suspected, not well evaluated on this single contrast upper gastrointestinal imaging study. Note that there is limited passage of administered oral contrast media which is somewhat positional from the intrathoracic into the subdiaphragmatic stomach. Stomach is not distended on the current study below the diaphragm and is not well assessed. Gastroesophageal reflux: Spontaneous severe gastroesophageal reflux from the intrathoracic stomach into the esophagus. Ingested 48m barium tablet: Not given Other: None. IMPRESSION: Some esophageal dysmotility with abundant stasis of a dilation of the esophagus in the setting of a mixed type hernia which appears to limited passage of contrast from the intrathoracic portion of the stomach into the abdomen. Stomach not well assessed, correlate with any history of pain or vomiting. Widely patent gastroesophageal junction with signs of spontaneous reflux from the intrathoracic stomach into the esophagus. Fold thickening throughout the intrathoracic stomach in this patient with mixed type hiatal hernia should be correlated with symptoms. Potentially related to gastritis but not well assessed on single contrast imaging. Electronically Signed   By: GZetta BillsM.D.   On: 04/19/2022 16:43    Cardiac Studies   Left Cardiac Catheterization 08/09/2021: 1.  Mild nonobstructive plaquing in the RCA (dominant vessel), unchanged from previous study 2.  Patent left main with no significant stenosis 3.  Patent LAD with mild proximal vessel stenosis of 40% 4.  Moderate ostial circumflex stenosis estimated at 60 to 70%, unchanged from the previous study.   The patient has stable coronary anatomy  from her prior cardiac catheterization study in 2017.  Recommend continued medical therapy.  Okay to resume apixaban 2.5 mg twice daily tomorrow morning.   Diagnostic Dominance: Right      _______________   Echocardiogram 03/10/2022: Impressions:  1. Left ventricular ejection fraction, by estimation, is 60 to 65%. The  left ventricle has normal function. The left ventricle has no regional  wall motion abnormalities. There is mild concentric left ventricular  hypertrophy. Left ventricular diastolic  parameters are consistent with Grade II diastolic dysfunction  (pseudonormalization).   2. Right ventricular systolic function is normal.  The right ventricular  size is normal. There is mildly elevated pulmonary artery systolic  pressure.   3. Trivial mitral valve regurgitation.   4. The aortic valve is grossly normal. Aortic valve regurgitation is  trivial.   5. The inferior vena cava is normal in size with greater than 50%  respiratory variability, suggesting right atrial pressure of 3 mmHg.   Comparison(s): No significant change from prior study.         Assessment & Plan    1  PAF  Patient is maintaining SR   Currently on amiodarone 200 bid .  Eliquis 2.5 bid     I cont to think that  there are no better options for patient   She will do better if she can maintain SR.   Without amio she will go back into afib with RVR (may even with amio)  She is not a candidate for AV node ablation/PPM  2  CAD  Nonobstructive by cath in June 2023     3  Chronic HFpEF  Volume status overall appears OK   4  HTN  Follow  Off of lisinopril      BP controlled    Will sign off   I will make sure she has outpt appt  For questions or updates, please contact [place] Please consult [email or web address] for contact info under        Signed, Dorris Carnes, MD  04/21/2022, 8:27 AM

## 2022-04-21 NOTE — Progress Notes (Signed)
Notified MD about patient's BP being 102/44 with a MAP of 61. Patient was asymptomatic with poor oral intake. MD put in a new order for continuous IV dextrose 5% and 0.45% NaCl with KCL 40 mEq/L fluids.

## 2022-04-21 NOTE — TOC Progression Note (Signed)
Transition of Care [facility]) - Progression Note    Patient Details  Name: [name] MRN: [id number] Date of Birth: 06-09-1934  Transition of Care [facility]) CM/SW Contact  Joaquin Courts, RN Phone Number: 04/21/2022, 3:01 PM  Clinical Narrative:    CM spoke with patient's daughter [name] who reports that she has spoken with Nanine Means and they can come on Monday morning to assess patient to see if eligible to go to the [facility] at dc.  CM did go over PT evaluation with [name] and current level of function, discussed options to return to [facility] as well, [name] reports bed at [facility] is on "hold".  [name] also inquired if another rehab facility is an option, discussed would need to fax out to see which facilities able to offer a bed.  [name] at this time remains uncertain if she wants to proceed with return to [facility], look for another facility, or go to [facility], reports will speak with family and let TOC know.  CM did stress the Importance of having a plan in place as patient appears to be approaching medical stability per MD.   Expected Discharge Plan:  (unsure per pt's dtr [name]) Barriers to Discharge: Continued Medical Work up  Expected Discharge Plan and Services   Discharge Planning Services: CM Consult   Living arrangements for the past 2 months: [facility])                                       Social Determinants of Health (SDOH) Interventions SDOH Screenings   Food Insecurity: No Food Insecurity (04/20/2022)  Housing: Low Risk  (04/20/2022)  Transportation Needs: No Transportation Needs (04/20/2022)  Utilities: Not At Risk (04/20/2022)  Tobacco Use: Low Risk  (04/18/2022)    Readmission Risk Interventions    09/26/2019   10:56 AM 08/01/2019    4:14 PM  Readmission Risk Prevention Plan  Transportation Screening Complete Complete  PCP or Specialist Appt within 5-7 Days  Complete  PCP or Specialist Appt within 3-5 Days  Complete   Home Care Screening  Complete  Medication Review (RN CM)  Referral to Pharmacy  [place] or [place] Complete   Social Work Consult for [place] Planning/Counseling Complete   Palliative Care Screening Not Applicable   Medication Review Press photographer) Complete

## 2022-04-21 NOTE — Plan of Care (Signed)

## 2022-04-22 ENCOUNTER — Ambulatory Visit: Payer: Medicare Other | Admitting: Physician Assistant

## 2022-04-22 DIAGNOSIS — I4891 Unspecified atrial fibrillation: Secondary | ICD-10-CM | POA: Diagnosis not present

## 2022-04-22 DIAGNOSIS — K219 Gastro-esophageal reflux disease without esophagitis: Secondary | ICD-10-CM | POA: Diagnosis not present

## 2022-04-22 DIAGNOSIS — J449 Chronic obstructive pulmonary disease, unspecified: Secondary | ICD-10-CM | POA: Diagnosis not present

## 2022-04-22 DIAGNOSIS — I5032 Chronic diastolic (congestive) heart failure: Secondary | ICD-10-CM | POA: Diagnosis not present

## 2022-04-22 LAB — BASIC METABOLIC PANEL
Anion gap: 7 (ref 5–15)
BUN: 15 mg/dL (ref 8–23)
CO2: 22 mmol/L (ref 22–32)
Calcium: 7.8 mg/dL — ABNORMAL LOW (ref 8.9–10.3)
Chloride: 106 mmol/L (ref 98–111)
Creatinine, Ser: 0.65 mg/dL (ref 0.44–1.00)
GFR, Estimated: >60 mL/min (ref >60)
Glucose, Bld: 143 mg/dL — ABNORMAL HIGH (ref 70–99)
Potassium: 4.6 mmol/L (ref 3.5–5.1)
Sodium: 135 mmol/L (ref 135–145)

## 2022-04-22 LAB — CBC
HCT: 34.1 % — ABNORMAL LOW (ref 36.0–46.0)
Hemoglobin: 10.6 g/dL — ABNORMAL LOW (ref 12.0–15.0)
MCH: 28.2 pg (ref 26.0–34.0)
MCHC: 31.1 g/dL (ref 30.0–36.0)
MCV: 90.7 fL (ref 80.0–100.0)
Platelets: 219 10*3/uL (ref 150–400)
RBC: 3.76 MIL/uL — ABNORMAL LOW (ref 3.87–5.11)
RDW: 18.3 % — ABNORMAL HIGH (ref 11.5–15.5)
WBC: 9.2 10*3/uL (ref 4.0–10.5)
nRBC: 0 % (ref 0.0–0.2)

## 2022-04-22 LAB — CORTISOL-AM, BLOOD: Cortisol - AM: 13.7 ug/dL (ref 6.7–22.6)

## 2022-04-22 LAB — MAGNESIUM: Magnesium: 1.9 mg/dL (ref 1.7–2.4)

## 2022-04-22 MED ORDER — CHLORHEXIDINE GLUCONATE CLOTH 2 % EX PADS
6.0000 | MEDICATED_PAD | Freq: Every day | CUTANEOUS | Status: DC
  Administered 2022-04-23 – 2022-04-24 (×2): 6 via TOPICAL

## 2022-04-22 MED ORDER — DEXTROSE-NACL 5-0.45 % IV SOLN: INTRAVENOUS | Status: DC

## 2022-04-22 NOTE — NC FL2 (Signed)
[place] LEVEL OF CARE FORM     IDENTIFICATION  Patient Name: [name] Birthdate: 1934-03-18 Sex: female Admission Date (Current Location): 04/18/2022  [facility] and [place] Number:  Herbalist and Address:  [facility],  [place], [place]      Provider Number: [id number]  Attending Physician Name and Address:  Patrecia Pour, MD  Relative Name and Phone Number:  [name] (dtr) [phone]    Current Level of Care: Hospital Recommended Level of Care: Nursing Facility Prior Approval Number:    Date Approved/Denied:   PASRR Number: [id number]  Discharge Plan: SNF    Current Diagnoses: Patient Active Problem List   Diagnosis Date Noted   Palliative care encounter 04/21/2022   Goals of care, counseling/discussion 04/21/2022   Counseling and coordination of care 04/21/2022   Malnutrition of moderate degree 04/20/2022   A-fib ([place]) 04/19/2022   Atrial fibrillation with RVR ([place]) 04/18/2022   Hypotension 04/18/2022   Failure to thrive in adult 04/18/2022   Chronic hypoxic respiratory failure ([place]) 04/18/2022   Acute urinary retention 04/18/2022   RSV (respiratory syncytial virus pneumonia) 03/11/2022   COPD exacerbation ([place]) 03/09/2022   Paroxysmal atrial fibrillation with RVR ([place]) 03/09/2022   Acute respiratory failure with hypoxia ([place]) 03/09/2022   Bronchiectasis with acute exacerbation ([place]) 03/04/2022   URI (upper respiratory infection) 03/04/2022   Elevated troponin    Anemia 08/08/2021   Bladder spasm 08/08/2021   Chest pain 08/08/2021   Chest pain, rule out acute myocardial infarction 08/07/2021   Chronic heart failure with preserved ejection fraction ([place]) 02/03/2021   Cognitive decline 01/18/2021   Right knee pain 06/08/2020   Age-related osteoporosis without current pathological fracture 05/01/2020   Nasal congestion 05/01/2020   Personal history of COVID-19 05/01/2020   Bilateral  hearing loss 11/15/2019   Post-nasal drainage 11/15/2019   COPD (chronic obstructive pulmonary disease) ([place]) 10/30/2019   Atrial fibrillation (HCC)    Adhesive capsulitis of right shoulder [id number]   Metabolic encephalopathy [id number]   Leukocytosis 07/18/2019   S/P reverse total shoulder arthroplasty, 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   Rotator cuff arthropathy of left shoulder 05/03/2019   Shoulder pain, left 04/29/2019   Low back pain 03/13/2019   Degeneration of lumbar intervertebral disc 02/14/2019   Amnesia 02/06/2018   ETD (Eustachian tube dysfunction), bilateral 11/07/2017   Chronic nonintractable headache 11/07/2017   Otalgia 10/17/2017   Pain in joint of right elbow 05/02/2017   Rib lesion 04/20/2017   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   Atopic dermatitis 04/08/2016   Skin sensation disturbance 03/08/2016   Weakness 12/22/2015   Allergic rhinitis 09/15/2015   CAD (coronary artery disease) 05/18/2015   Essential hypertension 05/18/2015   Carotid artery disease ([place]) 05/18/2015   Cough 03/19/2015   Shortness of breath 12/26/2013   Abnormal gait 11/06/2013   Scoliosis (and kyphoscoliosis), idiopathic 07/30/2013   Acquired unequal leg length on left 04/11/2013   History of fracture 10/18/2012   Palpitations 10/18/2012   Constipation 09/05/2012   History of sinus tachycardia 09/05/2012   Spasm of back muscles 03/12/2012   Lumbar radiculopathy 11/23/2011   Mitral regurgitation 11/22/2011   Low compliance bladder 04/18/2011   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   Carpal tunnel syndrome 02/23/2009   Cardiovascular symptoms 02/23/2009   Vitamin D deficiency 02/23/2009   RECTAL BLEEDING 08/18/2008   PERSONAL HX COLONIC POLYPS 08/18/2008   SKIN CANCER, HX OF 08/14/2008   CARPAL TUNNEL  SYNDROME, HX OF 08/14/2008   SARCOIDOSIS, PULMONARY 08/08/2008   HLD (hyperlipidemia) 08/08/2008   Paroxysmal supraventricular tachycardia (HCC) 08/08/2008   GERD  (gastroesophageal reflux disease) 08/08/2008    Orientation RESPIRATION BLADDER Height & Weight     Self, Time, Situation, Place (forgetful)  Normal Incontinent Weight: 58 kg Height:  '5\' 3"'$  (160 cm)  BEHAVIORAL SYMPTOMS/MOOD NEUROLOGICAL BOWEL NUTRITION STATUS      Incontinent Diet (Dysphasia 3, thin liquids)  AMBULATORY STATUS COMMUNICATION OF NEEDS Skin   Extensive Assist Verbally Other (Comment), PU Stage and Appropriate Care (Wound type: Healing Stage 3 pressure injury, .2X.2X.1cm, red and dry, located in a slight "valley.")     PU Stage 3 Dressing:  (Dressing procedure/placement/frequency: Topical treatment orders provided for bedside nurses to perform as follows to protect from further injury: Foam dressing to sacrum, change Q 3 days or PRN soiling.)                 Personal Care Assistance Level of Assistance  Bathing, Feeding, Dressing Bathing Assistance: Maximum assistance Feeding assistance: Maximum assistance Dressing Assistance: Maximum assistance     Functional Limitations Info  Sight, Hearing, Speech Sight Info: Adequate Hearing Info: Impaired Speech Info: Adequate    SPECIAL CARE FACTORS FREQUENCY  PT (By licensed PT), OT (By licensed OT)     PT Frequency: 5x/week OT Frequency: 5x/week            Contractures Contractures Info: Not present    Additional Factors Info  Code Status, Allergies Code Status Info: DNR Allergies Info: Levaquin (Levofloxacin), Oxycodone, Sulfonamide Derivatives, Sulfa Antibiotics, Morphine           Current Medications (04/22/2022):  This is the current hospital active medication list Current Facility-Administered Medications  Medication Dose Route Frequency Provider Last Rate Last Admin   acetaminophen (TYLENOL) tablet 650 mg  650 mg Oral Q6H PRN Tu, Ching T, DO       amiodarone (NEXTERONE PREMIX) 360-4.14 MG/200ML-% (1.8 mg/mL) IV infusion  30 mg/hr Intravenous Continuous Elgie Congo, MD   Paused at 04/19/22  0551   amiodarone (PACERONE) tablet 200 mg  200 mg Oral BID Fay Records, MD   200 mg at 04/22/22 1100   apixaban (ELIQUIS) tablet 2.5 mg  2.5 mg Oral BID Tu, Ching T, DO   2.5 mg at 04/22/22 1100   dextrose 5 %-0.45 % sodium chloride infusion   Intravenous Continuous Patrecia Pour, MD 75 mL/hr at 04/22/22 0613 New Bag at 04/22/22 0613   feeding supplement (ENSURE ENLIVE / ENSURE PLUS) liquid 237 mL  237 mL Oral BID BM Tu, Ching T, DO   237 mL at 04/21/22 1320   ipratropium-albuterol (DUONEB) 0.5-2.5 (3) MG/3ML nebulizer solution 3 mL  3 mL Nebulization Q6H PRN Tu, Ching T, DO       mometasone-formoterol (DULERA) 100-5 MCG/ACT inhaler 2 puff  2 puff Inhalation BID Tu, Ching T, DO   2 puff at 04/22/22 0803   And   umeclidinium bromide (INCRUSE ELLIPTA) 62.5 MCG/ACT 1 puff  1 puff Inhalation Daily Tu, Ching T, DO   1 puff at 04/22/22 0803   multivitamin with minerals tablet 1 tablet  1 tablet Oral Daily Patrecia Pour, MD   1 tablet at 04/22/22 1100   Oral care mouth rinse  15 mL Mouth Rinse PRN Shelly Coss, MD       pantoprazole (PROTONIX) EC tablet 40 mg  40 mg Oral BID Eudelia Bunch, [facility]  40 mg at 04/22/22 1100   sertraline (ZOLOFT) tablet 75 mg  75 mg Oral Daily Tu, Ching T, DO   75 mg at 04/22/22 1100   tamsulosin (FLOMAX) capsule 0.4 mg  0.4 mg Oral Daily Shelly Coss, MD   0.4 mg at 04/22/22 1100     Discharge Medications: Please see discharge summary for a list of discharge medications.  Relevant Imaging Results:  Relevant Lab Results:   Additional Information SSN [id number]  Henrietta Dine, RN

## 2022-04-22 NOTE — Progress Notes (Signed)
[facility] PROGRESS NOTE  [name] (DOB: Oct 21, 1934) [id number] PCP: Garwin Brothers, MD  Brief Narrative: [name] is an 88 year old female with history of A-fib, PSVT, sarcoidosis, scoliosis, hypertension, HLD, COPD/bronchiectasis, coronary artery disease, diastolic CHF, GERD who presented from nursing facility with complaint of weakness, fatigue, decreased appetite.  She was recently admitted here for COPD exacerbation with RSV and was discharged on oxygen supplementation.  During that time, hospital course was completed by A-fib with RVR and was on amiodarone.  Patient having gradual decline.  She was also treated for aspiration pneumonia in the facility.  On presentation, she was tachycardic in the range of 140, rhythm showed A-fib, she was mildly hypertensive.  Lab work showed WBC of 14.8. Cardiology consulted. GI consulted for concern of esophageal dysmotility/stricture/hiatal hernia. Palliative care also consulted.  Plan is to continue treatment, evaluate for safe disposition (SNF search underway, but may also opt for ALF at [facility] if they can accommodate patient's needs). Either way, palliative care will follow along with the patient. If rehabilitation is no longer felt to be possible and/or desired, they can switch to hospice care at that point.  Subjective: Pt without complaints, still not eating much but does want hot soup/broth. Discussed with daughter by phone yesterday and at bedside today.   Objective: BP (!) 107/56   Pulse 66   Temp 98.1 F (36.7 C) (Oral)   Resp 16   Ht '5\' 3"'$  (1.6 m)   Wt 58 kg   LMP 02/22/1983 (Approximate)   SpO2 97%   BMI 22.65 kg/m   Gen: Elderly frail female in no distress Pulm: Clear, nonlabored  CV: RRR, no pitting edema GI: Soft, NT, ND, +BS  Neuro: Alert and disoriented. No new focal deficits. Ext: Warm, no deformities, decreased muscle bulk Skin: No rashes, lesions or ulcers on visualized skin   Assessment &  Plan: Principal Problem:   Atrial fibrillation with RVR (HCC) Active Problems:   CAD (coronary artery disease)   Leukocytosis   Chronic heart failure with preserved ejection fraction (HCC)   COPD (chronic obstructive pulmonary disease) (HCC)   GERD (gastroesophageal reflux disease)   Hypotension   Failure to thrive in adult   Chronic hypoxic respiratory failure ([place])   Acute urinary retention   A-fib (HCC)   Malnutrition of moderate degree   Palliative care encounter   Goals of care, counseling/discussion   Counseling and coordination of care  A-fib with RVR:  - Converted back to NSR with amiodarone. No BP room for BB/CCB. Risk of decompensation that accompanies AFib with RVR seems currently to justify risk of pulmonary toxicity with longer term amiodarone. D/w Dr. Harrington Challenger this morning. - Low dose eliquis continued. Could consider discontinuing this, though she has converted recently and is not bleeding.   Chronic hypoxic respiratory failure/COPD: History of COPD, pulmonary sarcoidosis, and recent admission for RSV. Has been on 3L since that time, though may not need that much.  - Titrated down from oxygen at this time.   Suspected esophageal stenosis:  - Continue diet as tolerated. SLP evaluation > can have up to dysphagia 3, though may opt for full liquids only. - GI consulted, risk of endoscopy felt to be prohibitive.    Chronic diastolic CHF: Appears euvolemic. CT chest showed increasing bilateral pleural effusion worse on the left.   - Diuretics on hold due to soft blood pressure. Giving maintenance IVF since oral intake is so poor.   Coronary artery disease: No anginal symptoms. -  DC aspirin since on anticoagulation    Leukocytosis: Seems to be somewhat chronic, though has resolved as of 3/1. No current infectious signs/symptoms. UA without leuk esterase.  - ?if related to silent microaspiration. Will monitor clinically without antibiotics at this time.    GERD:  - Continue  PPI.   Hypokalemia:  - Supplemented and increased to 4.6, will hold further supplementation.   Acute urinary retention, OAB: Has history of retention but also significant OAB with incontinence for which urology has been treating her with oxybutynin, gemtesa. Found to have 1L retention this admission, foley placed.  - Voiding trial today now that oxybutynin and gemtesa stopped. Started tamsulosin.  - Follow up with Dr. Claudia Desanctis regardless, may need foley at DC.   Generalized decline/failure to thrive, moderate protein calorie malnutrition: Increasing frequency of admissions and family notes she is at times sleeping all day, wants curtains drawn, declining to work with PT. Has no appetite and when she does eat, it's just fluids mostly. CT head/spine, CTA chest done on presentation did not show any infectious or metabolic cause for decline.  - RD consulted, will supplement protein as able - Family has met with palliative care to discuss current and future goals of care. Remains aimed at rehabilitation at this time, but would not want resuscitation in the event of cardiac arrest and wouldn't want artificial feeding.   Hypoglycemia:  - Push oral intake.  - Check cortisol in AM  Patrecia Pour, MD [facility] [email or web address] 04/22/2022, 11:33 AM

## 2022-04-22 NOTE — Progress Notes (Signed)
Physical Therapy Treatment Patient Details Name: Sherri ROUNSVILLE MRN: [id number] DOB: [birth year] Today's Date: 04/22/2022   History of Present Illness Patient is a 88 year old female who presented on 2/26 from [facility] with decreased appetite, SOB and fatigue. Patient was found to have a fib with RVR, chronic hypoxic respiratory failure, and suspected esophageal stenosis.   PMH: a fib, HTN, non obstructive CAD, COPD, GERD, asthma, pulmonary sarcoidosis.    PT Comments    Pt assisted with sit to stands however limited by weakness and fatigue.  Pt also with continuous soft BM and nursing into room to assist with hygiene and linen change.  Pt currently at least mod assist to stand.  Current d/c plans for ALF vs SNF.  IF ALF is unable to provide current assist level, pt may need SNF upon d/c.    Recommendations for follow up therapy are one component of a multi-disciplinary discharge planning process, led by the attending physician.  Recommendations may be updated based on patient status, additional functional criteria and insurance authorization.  Follow Up Recommendations  Skilled nursing-short term rehab (<3 hours/day) Can patient physically be transported by private vehicle: No   Assistance Recommended at Discharge Frequent or constant Supervision/Assistance  Patient can return home with the following Help with stairs or ramp for entrance;Assistance with cooking/housework;Assist for transportation   Equipment Recommendations  None recommended by PT    Recommendations for Other Services       Precautions / Restrictions Precautions Precautions: Fall Precaution Comments: having continuous soft stools     Mobility  Bed Mobility Overal bed mobility: Needs Assistance       Supine to sit: Mod assist, HOB elevated Sit to supine: Mod assist   General bed mobility comments: assist for trunk and scooting to EOB; assist for LEs onto bed    Transfers Overall transfer level: Needs  assistance Equipment used: Rolling walker (2 wheels) Transfers: Sit to/from Stand Sit to Stand: Mod assist, +2 physical assistance           General transfer comment: cues for hand placement, assist to rise and steady, at least min assist to remain standing; performed x2 for pericare however pt with soft BM all over periarea so RN assisted with transfer and back to bed for hygiene; pt also fatigued very quickly    Ambulation/Gait                   Stairs             Wheelchair Mobility    Modified Rankin (Stroke Patients Only)       Balance Overall balance assessment: Needs assistance Sitting-balance support: Feet supported, No upper extremity supported Sitting balance-Leahy Scale: Fair     Standing balance support: Bilateral upper extremity supported, During functional activity, Reliant on assistive device for balance Standing balance-Leahy Scale: Zero                              Cognition Arousal/Alertness: Awake/alert Behavior During Therapy: WFL for tasks assessed/performed                                   General Comments: not dizzy today, just reports fatigue and weakness; daughter present        Exercises      General Comments        Pertinent Vitals/Pain  Pain Assessment Pain Assessment: No/denies pain    Home Living                          Prior Function            PT Goals (current goals can now be found in the care plan section) Progress towards PT goals: Progressing toward goals    Frequency    Min 2X/week      PT Plan Current plan remains appropriate    Co-evaluation              AM-PAC PT "6 Clicks" Mobility   Outcome Measure  Help needed turning from your back to your side while in a flat bed without using bedrails?: A Little Help needed moving from lying on your back to sitting on the side of a flat bed without using bedrails?: A Lot Help needed moving to and  from a bed to a chair (including a wheelchair)?: Total Help needed standing up from a chair using your arms (e.g., wheelchair or bedside chair)?: Total Help needed to walk in hospital room?: Total Help needed climbing 3-5 steps with a railing? : Total 6 Click Score: 9    End of Session Equipment Utilized During Treatment: Gait belt Activity Tolerance: Patient limited by fatigue Patient left: in bed;with call bell/phone within reach;with nursing/sitter in room;with family/visitor present Nurse Communication: Mobility status PT Visit Diagnosis: Muscle weakness (generalized) (M62.81);Difficulty in walking, not elsewhere classified (R26.2)     Time: [id number] PT Time Calculation (min) (ACUTE ONLY): 24 min  Charges:  $Therapeutic Activity: 23-37 mins                    Jannette Spanner PT, DPT Physical Therapist Acute Rehabilitation Services Preferred contact method: Secure Chat Weekend Pager Only: [phone] Office: [place] 04/22/2022, 1:59 PM

## 2022-04-22 NOTE — Progress Notes (Signed)
Notified MD that patient had Foley removed at 0700 this morning. Patient had not had any output since then. RN bladder scanned patient at 1326 and patient had 197 mL in bladder. Patient was not complaining of any discomfort of bladder fullness. MD told RN to re-scan patient's bladder in about 4 hours. If patient had more than 300 cc when patient was bladder scanned, then patient would need a new Foley placed. RN re-scanned patient's bladder at 1725 and patient had 390 mL in bladder. The patient was not been able to void during the shift without the Foley. Notified MD about patient having 390 mL in bladder. MD placed an order to insert urethral catheter. Foley was placed at 1815 with urinary output of 450 mL yellow, clear urine.

## 2022-04-22 NOTE — TOC Progression Note (Addendum)
Transition of Care [facility]) - Progression Note    Patient Details  Name: [name] MRN: [id number] Date of Birth: 08-Mar-1934  Transition of Care [facility]) CM/SW Contact  Sherri Dine, RN Phone Number: 04/22/2022, 12:11 PM  Clinical Narrative:    Contacted pt's dtr [name]; confirmed [name] is to come assess pt  Monday; discussed exploring other options in case pt does not get accepted to [facility]; she agrees to bed search and her preference is [facility]; [facility] faxed out for bed offers; awaiting bed offers; also referral for OP Pall Care; will follow up.  Expected Discharge Plan:  (unsure per pt's dtr [name]) Barriers to Discharge: Continued Medical Work up  Expected Discharge Plan and Services   Discharge Planning Services: CM Consult   Living arrangements for the past 2 months: [facility])                                       Social Determinants of Health (SDOH) Interventions SDOH Screenings   Food Insecurity: No Food Insecurity (04/20/2022)  Housing: Low Risk  (04/20/2022)  Transportation Needs: No Transportation Needs (04/20/2022)  Utilities: Not At Risk (04/20/2022)  Tobacco Use: Low Risk  (04/18/2022)    Readmission Risk Interventions    09/26/2019   10:56 AM 08/01/2019    4:14 PM  Readmission Risk Prevention Plan  Transportation Screening Complete Complete  PCP or Specialist Appt within 5-7 Days  Complete  PCP or Specialist Appt within 3-5 Days Complete   Home Care Screening  Complete  Medication Review (RN CM)  Referral to Pharmacy  HRI or [place] Complete   Social Work Consult for [place] Planning/Counseling Complete   Palliative Care Screening Not Applicable   Medication Review Press photographer) Complete

## 2022-04-22 NOTE — Plan of Care (Signed)

## 2022-04-22 NOTE — Progress Notes (Signed)
Foley was removed this morning. Will monitor for the first 4-6 hour for the first void following Foley removal per guidelines.

## 2022-04-23 DIAGNOSIS — I5032 Chronic diastolic (congestive) heart failure: Secondary | ICD-10-CM | POA: Diagnosis not present

## 2022-04-23 DIAGNOSIS — K219 Gastro-esophageal reflux disease without esophagitis: Secondary | ICD-10-CM | POA: Diagnosis not present

## 2022-04-23 DIAGNOSIS — I4891 Unspecified atrial fibrillation: Secondary | ICD-10-CM | POA: Diagnosis not present

## 2022-04-23 DIAGNOSIS — J449 Chronic obstructive pulmonary disease, unspecified: Secondary | ICD-10-CM | POA: Diagnosis not present

## 2022-04-23 MED ORDER — MIDODRINE HCL 5 MG PO TABS
2.5000 mg | ORAL_TABLET | Freq: Three times a day (TID) | ORAL | Status: DC
  Administered 2022-04-23 – 2022-04-24 (×3): 2.5 mg via ORAL
  Filled 2022-04-23 (×3): qty 1

## 2022-04-23 NOTE — Progress Notes (Signed)
  Daily Progress Note   Patient Name: [name]       Date: 04/23/2022 DOB: 1934/09/03  Age: 88 y.o. MRN#: [id number] Attending Physician: Patrecia Pour, MD Primary Care Physician: Garwin Brothers, MD Admit Date: 04/18/2022 Length of Stay: 4 days  PMT provider continues to follow along with patient's journey. Seeking rehab placement at this time. Appreciate TOC assistance. PMT will continue to follow along with medical course and engage in conversations accordingly.   Chelsea Aus, DO Palliative Care Provider [phone]

## 2022-04-23 NOTE — Progress Notes (Addendum)
Occupational Therapy Treatment Patient Details Name: [name] MRN: [id number] DOB: Nov 17, 1934 Today's Date: 04/23/2022   History of present illness Patient is a 88 year old female who presented on 2/26 from [facility] with decreased appetite, SOB and fatigue. Patient was found to have a fib with RVR, chronic hypoxic respiratory failure, and suspected esophageal stenosis.   PMH: a fib, HTN, non obstructive CAD, COPD, GERD, asthma, pulmonary sarcoidosis.   OT comments  Attempted to see patient for session with BP checked x2  supine in bed in RUE with BP of 79/38 mmhg HR 74 bpm. Unable to check on LUE with IV running at this time. Patient and family in room were educated on importance of movement in bed in BUE and BLE with patient verbalized understanding. Nurse called into room at same time and in room assessing patient at end of session. Session was very limited on this date.  OT to continue to follow. D/c recommendations remain appropriate at this time.  Addendum: nurse reported that patients BP on LUE with IV stopped was 93/42 mmhg.    Recommendations for follow up therapy are one component of a multi-disciplinary discharge planning process, led by the attending physician.  Recommendations may be updated based on patient status, additional functional criteria and insurance authorization.    Follow Up Recommendations  Skilled nursing-short term rehab (<3 hours/day)     Assistance Recommended at Discharge Frequent or constant Supervision/Assistance  Patient can return home with the following  A lot of help with bathing/dressing/bathroom;Assistance with cooking/housework;Direct supervision/assist for medications management;Assist for transportation;Help with stairs or ramp for entrance;Direct supervision/assist for financial management;A lot of help with walking and/or transfers   Equipment Recommendations  None recommended by OT       Precautions / Restrictions Precautions Precautions:  Fall Precaution Comments: orthostatic Restrictions Weight Bearing Restrictions: No        Frequency  Min 2X/week           Plan Discharge plan remains appropriate       AM-PAC OT "6 Clicks" Daily Activity     Outcome Measure   Help from another person eating meals?: Total Help from another person taking care of personal grooming?: A Little Help from another person toileting, which includes using toliet, bedpan, or urinal?: A Lot Help from another person bathing (including washing, rinsing, drying)?: A Lot Help from another person to put on and taking off regular upper body clothing?: A Little Help from another person to put on and taking off regular lower body clothing?: A Lot 6 Click Score: 13    End of Session  Nurse in room able to stop IV and check BP on LUE reporting 93/41 mmhg   OT Visit Diagnosis: Unsteadiness on feet (R26.81);Other abnormalities of gait and mobility (R26.89);Muscle weakness (generalized) (M62.81);Dizziness and giddiness (R42)   Activity Tolerance Treatment limited secondary to medical complications (Comment)   Patient Left in bed;with call bell/phone within reach;with bed alarm set;with family/visitor present;with nursing/sitter in room   Nurse Communication Other (comment) (ok to see patient, nurse called into room)        Time: [id number] OT Time Calculation (min): 8 min  Charges: OT General Charges $OT Visit: 1 Visit  Rennie Plowman, MS Acute Rehabilitation Department Office# [place] 04/23/2022, 2:58 PM

## 2022-04-23 NOTE — TOC Progression Note (Signed)
Transition of Care [facility]) - Progression Note    Patient Details  Name: [name] MRN: [id number] Date of Birth: Nov 13, 1934  [facility]) CM/SW Contact  Rodney Booze, LCSW Phone Number: 04/23/2022, 10:09 AM  Clinical Narrative:    CSW reached out to the patient's daughter. CSW explained at this time who has offered and declined. Patient's daughter would like to know if the patient can go back to [facility] if [facility] not [facility]. CSW explained that we can reach out to see if they can review. CSW did call/ text brittany from [facility] left VM for brittany to review. TOC will continue to follow.   Expected Discharge Plan:  (unsure per pt's dtr [name]) Barriers to Discharge: Continued Medical Work up  Expected Discharge Plan and Services   Discharge Planning Services: CM Consult   Living arrangements for the past 2 months: [place] [facility])                                       Social Determinants of Health (SDOH) Interventions SDOH Screenings   Food Insecurity: No Food Insecurity (04/20/2022)  Housing: Low Risk  (04/20/2022)  Transportation Needs: No Transportation Needs (04/20/2022)  Utilities: Not At Risk (04/20/2022)  Tobacco Use: Low Risk  (04/18/2022)    Readmission Risk Interventions    09/26/2019   10:56 AM 08/01/2019    4:14 PM  Readmission Risk Prevention Plan  Transportation Screening Complete Complete  PCP or Specialist Appt within 5-7 Days  Complete  PCP or Specialist Appt within 3-5 Days Complete   Home Care Screening  Complete  Medication Review (RN CM)  Referral to Pharmacy  HRI or [place] Complete   Social Work Consult for [place] Planning/Counseling Complete   Palliative Care Screening Not Applicable   Medication Review Press photographer) Complete

## 2022-04-23 NOTE — Progress Notes (Signed)
[facility] PROGRESS NOTE  LUNABELLE Mcgrath (DOB: February 06, 1935) [id number] PCP: Garwin Brothers, MD  Brief Narrative: [name] is an 88 year old female with history of A-fib, PSVT, sarcoidosis, scoliosis, hypertension, HLD, COPD/bronchiectasis, coronary artery disease, diastolic CHF, GERD who presented from nursing facility with complaint of weakness, fatigue, decreased appetite.  She was recently admitted here for COPD exacerbation with RSV and was discharged on oxygen supplementation.  During that time, hospital course was completed by A-fib with RVR and was on amiodarone.  Patient having gradual decline.  She was also treated for aspiration pneumonia in the facility.  On presentation, she was tachycardic in the range of 140, rhythm showed A-fib, she was mildly hypertensive.  Lab work showed WBC of 14.8. Cardiology consulted. GI consulted for concern of esophageal dysmotility/stricture/hiatal hernia. Palliative care also consulted.  Plan is to continue treatment, evaluate for safe disposition (SNF search underway, but may also opt for ALF at [facility] if they can accommodate patient's needs). Either way, palliative care will follow along with the patient. If rehabilitation is no longer felt to be possible and/or desired, they can switch to hospice care at that point.  Subjective: No complaints, awakens to voice and is interactive.   Objective: BP (!) 86/46 (BP Location: Left Arm)   Pulse 72   Temp 98 F (36.7 C)   Resp 20   Ht '5\' 3"'$  (1.6 m)   Wt 58 kg   LMP 02/22/1983 (Approximate)   SpO2 97%   BMI 22.65 kg/m   Frail elderly female in no distress RRR, trace pitting edema dependently (new)  Clear, nonlabored  Assessment & Plan: Principal Problem:   Atrial fibrillation with RVR (HCC) Active Problems:   CAD (coronary artery disease)   Leukocytosis   Chronic heart failure with preserved ejection fraction (HCC)   COPD (chronic obstructive pulmonary disease) (HCC)   GERD  (gastroesophageal reflux disease)   Hypotension   Failure to thrive in adult   Chronic hypoxic respiratory failure (HCC)   Acute urinary retention   A-fib (HCC)   Malnutrition of moderate degree   Palliative care encounter   Goals of care, counseling/discussion   Counseling and coordination of care  A-fib with RVR:  - Converted back to NSR with amiodarone. No BP room for BB/CCB. Risk of decompensation that accompanies AFib with RVR seems currently to justify risk of pulmonary toxicity with longer term amiodarone. D/w Dr. Harrington Challenger this morning. - Low dose eliquis continued. Could consider discontinuing this, though she has converted recently and is not bleeding. - BP remains soft but asymptomatic. No evidence of sepsis, bleeding, dehydration. Cortisol sufficient. Start midodrine 2.'5mg'$  TID.    Chronic hypoxic respiratory failure/COPD: History of COPD, pulmonary sarcoidosis, and recent admission for RSV. Has been on 3L since that time, though may not need that much.  - Titrated down from oxygen at this time.   Suspected esophageal stenosis:  - Continue diet as tolerated. SLP evaluation > can have up to dysphagia 3, though may opt for full liquids only. - GI consulted, risk of endoscopy felt to be prohibitive.    Chronic diastolic CHF: Appears euvolemic. CT chest showed increasing bilateral pleural effusion worse on the left.   - Diuretics on hold due to soft blood pressure. Giving maintenance IVF since oral intake is so poor. Lower rate slightly given some edema by exam.   Coronary artery disease: No anginal symptoms. - DC aspirin since on anticoagulation    Leukocytosis: Seems to be somewhat chronic, though  has resolved as of 3/1. No current infectious signs/symptoms. UA without leuk esterase.  - ?if related to silent microaspiration. Will monitor clinically without antibiotics at this time.    GERD:  - Continue PPI.   Hypokalemia:  - Supplemented and increased to 4.6, will hold further  supplementation.   Acute urinary retention, OAB: Has history of retention but also significant OAB with incontinence for which urology has been treating her with oxybutynin, gemtesa. Found to have 1L retention this admission, foley placed.  - Failed voiding trial 3/1 - Follow up with Dr. Claudia Desanctis, will need foley at DC   Generalized decline/failure to thrive, moderate protein calorie malnutrition: Increasing frequency of admissions and family notes she is at times sleeping all day, wants curtains drawn, declining to work with PT. Has no appetite and when she does eat, it's just fluids mostly. CT head/spine, CTA chest done on presentation did not show any infectious or metabolic cause for decline.  - RD consulted, will supplement protein as able - Family has met with palliative care to discuss current and future goals of care. Remains aimed at rehabilitation at this time, but would not want resuscitation in the event of cardiac arrest and wouldn't want artificial feeding.   Hypoglycemia:  - Push oral intake.  - Check cortisol in AM  Patrecia Pour, MD [facility] [email or web address] 04/23/2022, 4:31 PM

## 2022-04-24 DIAGNOSIS — Z79899 Other long term (current) drug therapy: Secondary | ICD-10-CM

## 2022-04-24 DIAGNOSIS — I4891 Unspecified atrial fibrillation: Secondary | ICD-10-CM | POA: Diagnosis not present

## 2022-04-24 DIAGNOSIS — R4589 Other symptoms and signs involving emotional state: Secondary | ICD-10-CM | POA: Insufficient documentation

## 2022-04-24 DIAGNOSIS — R338 Other retention of urine: Secondary | ICD-10-CM | POA: Diagnosis not present

## 2022-04-24 DIAGNOSIS — J9611 Chronic respiratory failure with hypoxia: Secondary | ICD-10-CM | POA: Diagnosis not present

## 2022-04-24 DIAGNOSIS — Z515 Encounter for palliative care: Secondary | ICD-10-CM | POA: Insufficient documentation

## 2022-04-24 DIAGNOSIS — J90 Pleural effusion, not elsewhere classified: Secondary | ICD-10-CM | POA: Diagnosis not present

## 2022-04-24 DIAGNOSIS — R52 Pain, unspecified: Secondary | ICD-10-CM

## 2022-04-24 DIAGNOSIS — R0600 Dyspnea, unspecified: Secondary | ICD-10-CM

## 2022-04-24 DIAGNOSIS — J449 Chronic obstructive pulmonary disease, unspecified: Secondary | ICD-10-CM | POA: Diagnosis not present

## 2022-04-24 DIAGNOSIS — K219 Gastro-esophageal reflux disease without esophagitis: Secondary | ICD-10-CM | POA: Diagnosis not present

## 2022-04-24 DIAGNOSIS — Z66 Do not resuscitate: Secondary | ICD-10-CM

## 2022-04-24 DIAGNOSIS — Z7189 Other specified counseling: Secondary | ICD-10-CM | POA: Diagnosis not present

## 2022-04-24 DIAGNOSIS — I5032 Chronic diastolic (congestive) heart failure: Secondary | ICD-10-CM | POA: Diagnosis not present

## 2022-04-24 LAB — BASIC METABOLIC PANEL
Anion gap: 4 — ABNORMAL LOW (ref 5–15)
BUN: 11 mg/dL (ref 8–23)
CO2: 22 mmol/L (ref 22–32)
Calcium: 8 mg/dL — ABNORMAL LOW (ref 8.9–10.3)
Chloride: 111 mmol/L (ref 98–111)
Creatinine, Ser: 0.67 mg/dL (ref 0.44–1.00)
GFR, Estimated: >60 mL/min (ref >60)
Glucose, Bld: 112 mg/dL — ABNORMAL HIGH (ref 70–99)
Potassium: 4 mmol/L (ref 3.5–5.1)
Sodium: 137 mmol/L (ref 135–145)

## 2022-04-24 MED ORDER — MORPHINE SULFATE (PF) 2 MG/ML IV SOLN: 1.0000 mg | INTRAVENOUS | Status: DC | PRN

## 2022-04-24 MED ORDER — PANTOPRAZOLE SODIUM 40 MG IV SOLR
40.0000 mg | Freq: Every day | INTRAVENOUS | Status: DC
  Administered 2022-04-25 – 2022-04-27 (×3): 40 mg via INTRAVENOUS
  Filled 2022-04-24 (×3): qty 10

## 2022-04-24 MED ORDER — POLYVINYL ALCOHOL 1.4 % OP SOLN: 1.0000 [drp] | Freq: Four times a day (QID) | OPHTHALMIC | Status: DC | PRN

## 2022-04-24 MED ORDER — LORAZEPAM 2 MG/ML IJ SOLN: 0.5000 mg | INTRAMUSCULAR | Status: DC | PRN

## 2022-04-24 MED ORDER — HALOPERIDOL LACTATE 5 MG/ML IJ SOLN: 2.0000 mg | INTRAMUSCULAR | Status: DC | PRN

## 2022-04-24 MED ORDER — GLYCOPYRROLATE 0.2 MG/ML IJ SOLN: 0.2000 mg | INTRAMUSCULAR | Status: DC | PRN

## 2022-04-24 MED ORDER — MIDODRINE HCL 5 MG PO TABS: 5.0000 mg | ORAL_TABLET | Freq: Three times a day (TID) | ORAL | Status: DC

## 2022-04-24 NOTE — Progress Notes (Signed)
[facility] PROGRESS NOTE  [name] (DOB: 1934-08-28) [id number] PCP: Garwin Brothers, MD  Brief Narrative: [name] is an 88 year old female with history of A-fib, PSVT, sarcoidosis, scoliosis, hypertension, HLD, COPD/bronchiectasis, coronary artery disease, diastolic CHF, GERD who presented from nursing facility with complaint of weakness, fatigue, decreased appetite.  She was recently admitted here for COPD exacerbation with RSV and was discharged on oxygen supplementation.  During that time, hospital course was completed by A-fib with RVR and was on amiodarone.  Patient having gradual decline.  She was also treated for aspiration pneumonia in the facility.  On presentation, she was tachycardic in the range of 140, rhythm showed A-fib, she was mildly hypertensive.  Lab work showed WBC of 14.8. Cardiology consulted. GI consulted for concern of esophageal dysmotility/stricture/hiatal hernia. Palliative care also consulted.  Plan is to continue treatment, evaluate for safe disposition (SNF search underway, but may also opt for ALF at [facility] if they can accommodate patient's needs). Either way, palliative care will follow along with the patient. If rehabilitation is no longer felt to be possible and/or desired, they can switch to hospice care at that point.  Subjective: "Nothing's bothering me."   Objective: BP (!) 92/48 (BP Location: Right Arm)   Pulse 66   Temp 98.6 F (37 C) (Oral)   Resp 19   Ht '5\' 3"'$  (1.6 m)   Wt 58 kg   LMP 02/22/1983 (Approximate)   SpO2 93%   BMI 22.65 kg/m   No distress, frail and elderly Clear, nonlabored RRR  Assessment & Plan: Principal Problem:   Atrial fibrillation with RVR (HCC) Active Problems:   CAD (coronary artery disease)   Leukocytosis   Chronic heart failure with preserved ejection fraction (HCC)   COPD (chronic obstructive pulmonary disease) (HCC)   GERD (gastroesophageal reflux disease)   Hypotension   Failure to  thrive in adult   Chronic hypoxic respiratory failure ([place])   Acute urinary retention   A-fib (HCC)   Malnutrition of moderate degree   Palliative care encounter   Goals of care, counseling/discussion   Counseling and coordination of care  A-fib with RVR:  - Converted back to NSR with amiodarone. No BP room for BB/CCB. Risk of decompensation that accompanies AFib with RVR seems currently to justify risk of pulmonary toxicity with longer term amiodarone. D/w Dr. Harrington Challenger this morning. - Low dose eliquis continued. Could consider discontinuing this, though she has converted recently and is not bleeding. - BP remains soft but asymptomatic. No evidence of sepsis, bleeding, dehydration. Cortisol sufficient. Start midodrine 2.'5mg'$  TID.    Chronic hypoxic respiratory failure/COPD: History of COPD, pulmonary sarcoidosis, and recent admission for RSV. Has been on 3L since that time, though may not need that much.  - Titrated down from oxygen at this time.   Suspected esophageal stenosis:  - Continue diet as tolerated. SLP evaluation > can have up to dysphagia 3, though may opt for full liquids only. - GI consulted, risk of endoscopy felt to be prohibitive.    Chronic diastolic CHF: Appears euvolemic. CT chest showed increasing bilateral pleural effusion worse on the left.   - Diuretics on hold due to soft blood pressure. Giving maintenance IVF since oral intake is so poor. Lowered rate slightly to 80m/hr given some edema by exam. This is stable.   Coronary artery disease: No anginal symptoms. - DC aspirin since on anticoagulation    Leukocytosis: Seems to be somewhat chronic, though has resolved as of 3/1. No  current infectious signs/symptoms. UA without leuk esterase.  - ?if related to silent microaspiration. Will monitor clinically without antibiotics at this time.    GERD:  - Continue PPI.   Hypokalemia:  - Supplemented and increased to 4.6, will hold further supplementation.   Acute  urinary retention, OAB: Has history of retention but also significant OAB with incontinence for which urology has been treating her with oxybutynin, gemtesa. Found to have 1L retention this admission, foley placed.  - Failed voiding trial 3/1, replaced foley which she will DC with pending follow up with her urologist, Dr. Claudia Desanctis.  - Holding OAB medications   Generalized decline/failure to thrive, moderate protein calorie malnutrition: Increasing frequency of admissions and family notes she is at times sleeping all day, wants curtains drawn, declining to work with PT. Has no appetite and when she does eat, it's just fluids mostly. CT head/spine, CTA chest done on presentation did not show any infectious or metabolic cause for decline.  - RD consulted, will supplement protein as able - Family has met with palliative care to discuss current and future goals of care. Remains aimed at rehabilitation at this time, but would not want resuscitation in the event of cardiac arrest and wouldn't want artificial feeding.   Hypoglycemia: Due to malnutrition, poor oral intake. Cortisol wnl. - Push oral intake.    Patrecia Pour, MD [facility] [email or web address] 04/24/2022, 9:01 AM

## 2022-04-24 NOTE — Progress Notes (Signed)
Daily Progress Note   Patient Name: [name]       Date: 04/24/2022 DOB: [birth year]  Age: 88 y.o. MRN#: [id number] Attending Physician: Patrecia Pour, MD Primary Care Physician: Garwin Brothers, MD Admit Date: 04/18/2022 Length of Stay: 5 days  Reason for Consultation/Follow-up: Establishing goals of care and Symptom Management  Subjective:   CC: Patient more confused today though willing to engage in conversation with family present. Following up regarding complex medical decision making.   Subjective:  Extensive review of EMR prior to seeing patient.  Since patient was last seen by this palliative provider a few days ago, medical status has continued to deteriorate.  Patient is not able to maintain oral intake and is requiring IV dextrose to maintain glucose level.  Patient's blood pressure has also been worsening and patient was started on midodrine which has not been increased to 5 mg 3 times daily.  Despite midodrine addition, patient remaining borderline hypotensive.  Cussed care with primary hospitalist prior to seeing patient today.  ------------------------------------------------------------------------------------------------------------- Advance Care Planning Conversation  Pertinent diagnosis: A-fib, PSVT, sarcoidosis, scoliosis, hypertension, hyperlipidemia, COPD/bronchiectasis, CAD, HFpEF, recent hx of aspiration PNA and RSV  The patient and/or family consented to a voluntary [place]. Individuals present for the conversation:  Presented to see patient at bedside.  Patient's daughter, [name], and youngest grandson were there.  Again introduced myself as a member of the palliative medicine team.  Patient unsure if she remembers speaking with me the other day.  Patient is awake and willing to engage in conversation with her family present though easily confused and having difficulty processing complex conversations. Patient family assisted in  conversation ,particularly patient's one daughter.   Summary of the conversation:  With permission from the patient, we discussed medical care moving forward.  Able to express concerns regarding patient's medical deterioration over the past couple of days.  Patient now needing multiple forms of support to maintain life coding IV dextrose and midodrine.  Discussed that patient is not able to even participate in rehab at this time though patient had previously expressed this being her goal.   We discussed possible options for care moving forward.  While discussing pathways for medical care, did discuss possible pathway of transition to comfort focused care at this time.  Explained comfort focused care would mean focusing on symptom management at the end of life such as managing pain, dyspnea, and anxiety.  Discussed discontinuing interventions that are no longer going to aid in the quality of life such as IV fluids, imaging, lab work, and medications that are not symptom based (including midodrine).  After discussion and explanation of the possible pathways, decision made by daughter, family, and patient to transition to comfort focused care at this time to allow for quality time with family moving forward.  Patient was supposed to be evaluated for returning to ALF tomorrow.  Acknowledged the patient's level of care cannot be provided in an assisted living facility.  Discussed possible settings to settings to support comfort focused care moving forward specifically through involving hospice.  Home hospice is not an option for the patient and her family.  Discussed then other options for hospice support are either long-term care with hospice or inpatient hospice.  Discussed that to go to an inpatient hospice facility, must meet guidelines and would have to be excepted there. Explained that at this time we will transition to comfort focused care, and we will continue to follow patient's pathway to determine  the  best setting whether to be long-term care with hospice or inpatient hospice. All questions answered at that time, provided emotional support via active listening.   Outcome of the conversations and/or documents completed:  Transition to comfort focused care at this time.   I spent 29 minutes providing separately identifiable ACP services with the patient and/or surrogate decision maker in a voluntary, in-person conversation discussing the patient's wishes and goals as detailed in the above note.  Chelsea Aus, DO Palliative Care Provider  -------------------------------------------------------------------------------------------------------------  Then able to speak with patient's daughter and stepson outside of the room.  Answered further questions regarding prognosis.  Did express concern that once IV dextrose and midodrine are discontinued, time is likely short on hours of days to short weeks.  Explained that palliative medicine team will continue to follow along and evaluate to determine setting for comfort care including in-hospital death, long-term care with hospice support, or inpatient hospice.  Family did voice that if patient was to go to inpatient hospice, they are primary choice would be [facility] here in [place].  Acknowledged this and noted would inform TOC and liaison. Daughter also expressed great concern that their eldest son, patient's grandson son, has lots of emotional " baggage" and she is concerned how he is processing this.  Acknowledged this and that palliative medicine provider is willing to talk with son if he wants to meet.  Also noted would involve chaplain for support from third-party who was involved in patient's care.  Also encouraged family seek outside emotional support via therapy if appropriate. All questions answered at that time.   Updated care team regarding transition to comfort focused care at this time.  Review of Systems Denies any symptoms of  concern Objective:   Vital Signs:  BP (!) 109/53 (BP Location: Left Arm)   Pulse 70   Temp 97.7 F (36.5 C) (Oral)   Resp 18   Ht '5\' 3"'$  (1.6 m)   Wt 58 kg   LMP 02/22/1983 (Approximate)   SpO2 96%   BMI 22.65 kg/m   Physical Exam: General: NAD, awake, pleasant,, chronically ill-appearing Eyes: No drainage noted HENT: moist mucous membranes Cardiovascular: RRR Respiratory: no increased work of breathing noted, not in respiratory distress Abdomen: not distended Extremities: Muscle wasting present in all extremities Skin: no rashes or lesions on visible skin; review of EMR noted stage III sacral wound present on admission Neuro: awake, interactive, able to participate in simple conversation though not complex medical decision making Psych: pleasant  Imaging:  I personally reviewed recent imaging.   Assessment & Plan:   Assessment: Patient is an 88 year old female with a past medical history of A-fib, PSVT, sarcoidosis, scoliosis, hypertension, hyperlipidemia, COPD/bronchiectasis, CAD, HFpEF, and GERD who was admitted on 04/18/2022 from a nursing facility for management of weakness, fatigue, and decreased appetite.  Patient had recently been admitted for COPD exacerbation with positive RSV.  During this admission, patient was found to be in A-fib with RVR and required medication for management.  Patient noted to have suspected esophageal stenosis and so GI was consulted.  No EGD recommended by GI at this time due to concerns for risk over benefits of procedure.  Also needing management for acute urinary retention requiring Foley placement.  Palliative medicine team consulted to assist with complex medical decision making.   Recommendations/Plan: # Complex medical decision making/goals of care  -Patient unable to participate in complex medical decision making though present for entirety of conversation regarding plan to transition  to comfort focused care.   -Spoke with patient's  daughter, son in law, grandson at bedside at length as described above in HPI.  Patient's medical status has continued to deteriorate despite aggressive medical management.  Patient no longer appropriate for rehab therapy.  Will transition to comfort focused care at this time.  -At this time we will discontinue interventions that are no longer focused on comfort such as IV fluids, imaging, or lab work.  Will instead focus on symptom management of pain, dyspnea, and agitation in the setting of end-of-life care.  - Code Status: DNR   # Symptom management:    -Pain/Dyspnea, acute in the setting of end-of-life care                               -Started IV morphine 1-3 mg IV every 30 mins as needed.  Continue to adjust based on patient's symptom burden.  If patient needing frequent dosing, may need to consider continuous infusion.                  -Anxiety/agitation, in the setting of end-of-life care                               -Started IV Ativan 0.5 mg every 4 hours as needed. Continue to adjust based on patient's symptom burden.                                 -And also has IV Haldol 2 mg every 4 hours as needed. Continue to adjust based on patient's symptom burden.                   -Secretions, in the setting of end-of-life care                               -Started IV glycopyrrolate 0.2 mg every 4 hours as needed.  Please continue foley catheter for comfort.   # Psychosocial Support:  -daughter, SIL, grandsons  # Discharge Planning: TBD. Transitioning to comfort focused care at this time. Will monitor status with this transition. In hospital death possible. Consider inpatient hospice (more likely) vs LTC with hospice if stable.   Discussed with: patient, f daughter, son-in-law, grandson, hospitalist, TOC, [place] liaison, bedside RN  Thank you for allowing the palliative care team to participate in the care [name].  Chelsea Aus, DO Palliative Care Provider PMT #  [phone]  If patient remains symptomatic despite maximum doses, please call PMT at [phone] between 0700 and 1900. Outside of these hours, please call attending, as PMT does not have night coverage.  *Please note that this is a verbal dictation therefore any spelling or grammatical errors are due to the "[employer]" system interpretation.

## 2022-04-24 NOTE — TOC Progression Note (Signed)
Transition of Care [facility]) - Progression Note    Patient Details  Name: [name] MRN: [id number] Date of Birth: 1934/03/29  Transition of Care [facility]) CM/SW Contact  Rodney Booze, LCSW Phone Number: 04/24/2022, 11:36 AM  Clinical Narrative:    CSW has reached back out to [place] at [facility] still no response. TOC will continue to follow this patient.    Expected Discharge Plan:  (unsure per pt's dtr [name]) Barriers to Discharge: Continued Medical Work up  Expected Discharge Plan and Services   Discharge Planning Services: CM Consult   Living arrangements for the past 2 months: [facility])                                       Social Determinants of Health (SDOH) Interventions SDOH Screenings   Food Insecurity: No Food Insecurity (04/20/2022)  Housing: Low Risk  (04/20/2022)  Transportation Needs: No Transportation Needs (04/20/2022)  Utilities: Not At Risk (04/20/2022)  Tobacco Use: Low Risk  (04/18/2022)    Readmission Risk Interventions    09/26/2019   10:56 AM 08/01/2019    4:14 PM  Readmission Risk Prevention Plan  Transportation Screening Complete Complete  PCP or Specialist Appt within 5-7 Days  Complete  PCP or Specialist Appt within 3-5 Days Complete   Home Care Screening  Complete  Medication Review (RN CM)  Referral to Pharmacy  HRI or [place] Complete   Social Work Consult for [place] Planning/Counseling Complete   Palliative Care Screening Not Applicable   Medication Review Press photographer) Complete

## 2022-04-24 NOTE — TOC Progression Note (Signed)
Transition of Care [facility]) - Progression Note    Patient Details  Name: [name] MRN: [id number] Date of Birth: 10/30/1934  Transition of Care [facility]) CM/SW Contact  Rodney Booze, LCSW Phone Number: 04/24/2022, 3:16 PM  Clinical Narrative:    Provider update patient will not be able to tolerate rehab at this time. Please see provider notes on next steps.  Expected Discharge Plan:  (unsure per pt's dtr [name]) Barriers to Discharge: Continued Medical Work up  Expected Discharge Plan and Services   Discharge Planning Services: CM Consult   Living arrangements for the past 2 months: [facility])                                       Social Determinants of Health (SDOH) Interventions SDOH Screenings   Food Insecurity: No Food Insecurity (04/20/2022)  Housing: Low Risk  (04/20/2022)  Transportation Needs: No Transportation Needs (04/20/2022)  Utilities: Not At Risk (04/20/2022)  Tobacco Use: Low Risk  (04/18/2022)    Readmission Risk Interventions    09/26/2019   10:56 AM 08/01/2019    4:14 PM  Readmission Risk Prevention Plan  Transportation Screening Complete Complete  PCP or Specialist Appt within 5-7 Days  Complete  PCP or Specialist Appt within 3-5 Days Complete   Home Care Screening  Complete  Medication Review (RN CM)  Referral to Pharmacy  HRI or [place] Complete   Social Work Consult for [place] Planning/Counseling Complete   Palliative Care Screening Not Applicable   Medication Review Press photographer) Complete

## 2022-04-25 DIAGNOSIS — Z66 Do not resuscitate: Secondary | ICD-10-CM

## 2022-04-25 DIAGNOSIS — Z79899 Other long term (current) drug therapy: Secondary | ICD-10-CM

## 2022-04-25 DIAGNOSIS — R338 Other retention of urine: Secondary | ICD-10-CM | POA: Diagnosis not present

## 2022-04-25 DIAGNOSIS — I4891 Unspecified atrial fibrillation: Secondary | ICD-10-CM | POA: Diagnosis not present

## 2022-04-25 DIAGNOSIS — J90 Pleural effusion, not elsewhere classified: Secondary | ICD-10-CM

## 2022-04-25 DIAGNOSIS — Z7189 Other specified counseling: Secondary | ICD-10-CM

## 2022-04-25 DIAGNOSIS — R52 Pain, unspecified: Secondary | ICD-10-CM

## 2022-04-25 DIAGNOSIS — Z515 Encounter for palliative care: Secondary | ICD-10-CM | POA: Diagnosis not present

## 2022-04-25 DIAGNOSIS — R4589 Other symptoms and signs involving emotional state: Secondary | ICD-10-CM

## 2022-04-25 NOTE — Care Management Important Message (Signed)
Important Message  Patient Details IM Letter given. Name: [name] MRN: [id number] Date of Birth: 1934-03-01   Medicare Important Message Given:  Yes     Kerin Salen 04/25/2022, 1:00 PM

## 2022-04-25 NOTE — Progress Notes (Signed)
[facility])   Referral received on 3.04 for [facility] to evaluate patient for BP from PMT provider/Dr. Vinetta Bergamo. Upon further discussion with daughter/[name], PMT & provider, ACC to hold on BP referral given patients was having potato soup and waiting for additional food orders from family visiting. Additionally, family has requested additional conversations with PMT and/or MD once additionally family arrives in town this PM.   ACC to follow up 04/26/22.    Please call with any questions/concerns.    Thank you for the opportunity to participate in this patient's care.   Phillis Haggis, MSW [facility] Liaison  [phone]

## 2022-04-25 NOTE — Progress Notes (Signed)
[facility] PROGRESS NOTE  [name] (DOB: Mar 22, 1934) [id number] PCP: Garwin Brothers, MD  Brief Narrative: [name] is an 88 year old female with history of A-fib, PSVT, sarcoidosis, scoliosis, hypertension, HLD, COPD/bronchiectasis, coronary artery disease, diastolic CHF, GERD who presented from nursing facility with complaint of weakness, fatigue, decreased appetite.  She was recently admitted here for COPD exacerbation with RSV and was discharged on oxygen supplementation.  During that time, hospital course was completed by A-fib with RVR and was on amiodarone.  Patient having gradual decline.  She was also treated for aspiration pneumonia in the facility.  On presentation, she was tachycardic in the range of 140, rhythm showed A-fib, she was mildly hypertensive.  Lab work showed WBC of 14.8. Cardiology consulted. GI consulted for concern of esophageal dysmotility/stricture/hiatal hernia. Palliative care also consulted and family meetings have been ongoing.   Throughout hospitalization, the patient has remained frail appearing and does not appear to have the desire for rehabilitation (declining PT at times). Despite unrestricted diet and frequent encouragement, removal of all modifiable barriers to oral intake, this has remained poor, requiring IV fluids with dextrose due to hypoglycemia. Hypotension has been persistent despite treating dehydration and in the absence of bleeding, adrenal insufficiency, or infection. In the face of this continued decline, the decision to transition to comfort measures has been made.   Subjective: Denies pain currently, but did have back discomfort at some point she attributed to being in the bed. She was requesting crispy bacon and wants to eat, though at time of evaluation sh had not actually eaten anything.   Objective: BP (!) 111/46 (BP Location: Right Arm)   Pulse 66   Temp 98.2 F (36.8 C) (Oral)   Resp 19   Ht '5\' 3"'$  (1.6 m)   Wt 58 kg    LMP 02/22/1983 (Approximate)   SpO2 96%   BMI 22.65 kg/m   Frail, elderly female redirectably confused in no distress sitting in bed Clear, nonlabored Regular  Assessment & Plan: Principal Problem:   Atrial fibrillation with RVR (HCC) Active Problems:   CAD (coronary artery disease)   Leukocytosis   Chronic heart failure with preserved ejection fraction (HCC)   COPD (chronic obstructive pulmonary disease) (HCC)   GERD (gastroesophageal reflux disease)   Hypotension   Failure to thrive in adult   Chronic hypoxic respiratory failure (HCC)   Acute urinary retention   A-fib (HCC)   Malnutrition of moderate degree   Palliative care encounter   Goals of care, counseling/discussion   Counseling and coordination of care   Pleural effusion on left   Need for emotional support   High risk medication use   Pain   DNR (do not resuscitate)   End of life care   Dyspnea  A-fib with RVR:  - Converted back to NSR with amiodarone. With transition to comfort measures, this was stopped. We will restart it to avoid symptoms related to reversion to AFib at least until she gets to hospice.   - Stopped eliquis with suggestion of lower GI bleeding.  - BP has remained low with MAPs consistently < 23mHg though since the patient isn't getting up it is not terribly symptomatic. Midodrine was started for BP support, but stopped with comfort measures transition    Chronic hypoxic respiratory failure/COPD: History of COPD, pulmonary sarcoidosis, and recent admission for RSV. Has been on 3L since that time, though may not need that much.  - Titrated down from oxygen at this time.  Suspected esophageal stenosis:  - Continue diet as tolerated. Unrestricted. - GI consulted, risk of endoscopy felt to be prohibitive.    Chronic diastolic CHF: Appears euvolemic. CT chest showed increasing bilateral pleural effusion worse on the left.   - Diuretics on hold due to soft blood pressure. Maintenance IVF  started having some edema. Stopped with transition to comfort measures.    Coronary artery disease: No anginal symptoms.   Leukocytosis: Resolved as of 3/1. No current infectious signs/symptoms. UA without leuk esterase.  - ?if related to silent microaspiration. Will monitor clinically without antibiotics at this time.    GERD:  - Continue PPI.   Hypokalemia: Resolved with supplementation.   Acute urinary retention, OAB: Has history of retention but also significant OAB with incontinence for which urology has been treating her with oxybutynin, gemtesa. Found to have 1L retention this admission, foley placed.  - Failed voiding trial 3/1, replaced foley which we will continue. - Holding OAB medications   Generalized decline/failure to thrive, moderate protein calorie malnutrition: Increasing frequency of admissions and family notes she is at times sleeping all day, wants curtains drawn, declining to work with PT. Has no appetite and when she does eat, it's just fluids mostly. CT head/spine, CTA chest done on presentation did not show any infectious or metabolic cause for decline.  - RD consulted, will supplement protein as able - Family has met with palliative care to discuss current and future goals of care. We've transitioned to comfort measures. If patient begins eating again, hemodynamics improve, and appears capable of rehabilitation, we can transfer to a nursing facility with palliative care. Though if her inadequate oral intake continues, she would be a candidate for residential hospice.   Hypoglycemia: Due to malnutrition, poor oral intake. Cortisol wnl. - Push oral intake.    Patrecia Pour, MD [facility] [email or web address] 04/25/2022, 1:05 PM

## 2022-04-25 NOTE — Progress Notes (Signed)
Daily Progress Note   Patient Name: [name]       Date: 04/25/2022 DOB: Mar 07, 1934  Age: 88 y.o. MRN#: [id number] Attending Physician: Patrecia Pour, MD Primary Care Physician: Garwin Brothers, MD Admit Date: 04/18/2022 Length of Stay: 6 days  Reason for Consultation/Follow-up: Establishing goals of care and Symptom Management  Subjective:   CC: Patient more awake today. Met with family during the day. Following up regarding complex medical decision making.   Subjective:  Reviewed EMR prior to seeing patient.  Patient's blood pressure decreasing during the day. Also discussed care with care team throughout the day including [facility] hospice liaison.  Initially presented to bedside in AM.  Presenting, family visiting with chaplain.  Able to check on patient's symptom burden this morning.  Patient feeling comfortable at this time.  Patient's daughter present at bedside.  Discussed plan for continued comfort focused care at this time and evaluation by [facility] for potential transfer to [facility].  All questions answered at that time.  Provided emotional support via active listening.  Informed later in the day that patient's grandson was wanted to meet.  Able to meet with 2 grandsons later in the day along with patient's daughter, [name].  Dr. Bonner Puna was also able to join during our conversation.  Spent time discussing patient's underlying medical conditions.  Discussed changes in our bodies that occur as we age.  Discussed despite aggressive medical management here in the hospital, patient's body has continued to deteriorate.  Discussed natural progression as our bodies are shutting down and we are nearing death.  Discussed that patient was requiring artificial support of blood sugar as well as blood pressure.  Focusing on patient's comfort to allow for quality time with family. Patient is having a "good" day today.  Discussed the possible "rally" occurring to allow patient to have time with family.   Discussed if patient stabilizes, may then consider planning for future.  At this time, patient is not in taking enough food to maintain her life for a prolonged time and her blood pressure continues to be low.  Discussed follow-up people have this "rally" further deterioration can quickly occur and then death. Provide family with copies of Gone From My Sight and Hard Choices for [employer].  Spent time providing emotional support as able via active listening.  Able to answer all of family's questions at that time along with Dr. Bonner Puna.  Noted this palliative medicine provider would not be on service tomorrow though my partner Dr. Rowe Pavy would be available if needed.  Dr. Bonner Puna will be available tomorrow should family have further questions for him.  Daughter had already spoken to [facility].  [facility] liaison will continue to evaluate and potential transfer tomorrow if accepted and bed available.  Review of Systems Denies any symptoms of concern Objective:   Vital Signs:  BP (!) 111/46 (BP Location: Right Arm)   Pulse 66   Temp 98.2 F (36.8 C) (Oral)   Resp 19   Ht '5\' 3"'$  (1.6 m)   Wt 58 kg   LMP 02/22/1983 (Approximate)   SpO2 96%   BMI 22.65 kg/m   Physical Exam: General: NAD, awake, pleasant, chronically ill-appearing Eyes: No drainage noted HENT: moist mucous membranes Cardiovascular: RRR Respiratory: no increased work of breathing noted, not in respiratory distress Abdomen: not distended Extremities: Muscle wasting present in all extremities Skin: no rashes or lesions on visible skin; review of EMR noted stage III sacral wound present on  admission Neuro: awake, interactive, short term memory poor Psych: pleasant  Imaging:  I personally reviewed recent imaging.   Assessment & Plan:   Assessment: Patient is an 88 year old female with a past medical history of A-fib, PSVT, sarcoidosis, scoliosis, hypertension, hyperlipidemia, COPD/bronchiectasis, CAD, HFpEF,  and GERD who was admitted on 04/18/2022 from a nursing facility for management of weakness, fatigue, and decreased appetite.  Patient had recently been admitted for COPD exacerbation with positive RSV.  During this admission, patient was found to be in A-fib with RVR and required medication for management.  Patient noted to have suspected esophageal stenosis and so GI was consulted.  No EGD recommended by GI at this time due to concerns for risk over benefits of procedure.  Also needing management for acute urinary retention requiring Foley placement.  Palliative medicine team consulted to assist with complex medical decision making.   Recommendations/Plan: # Complex medical decision making/goals of care  -Patient unable to participate in complex medical decision making; short term memory is poor.   -Spoke with patient's daughter, son in law, and grandsons throughout the day as noted above in HPI. Patient will continue with comfort focused care at this time. [place] liaison will continue to evaluate for possible transfer to [facility].   - Code Status: DNR   # Symptom management:    -Pain/Dyspnea, acute in the setting of end-of-life care                               -Continue IV morphine 1-3 mg IV every 30 mins as needed.  Continue to adjust based on patient's symptom burden.  If patient needing frequent dosing, may need to consider continuous infusion.                  -Anxiety/agitation, in the setting of end-of-life care                               -Continue IV Ativan 0.5 mg every 4 hours as needed. Continue to adjust based on patient's symptom burden.                                 -Continue  IV Haldol 2 mg every 4 hours as needed. Continue to adjust based on patient's symptom burden.                   -Secretions, in the setting of end-of-life care                               -Continue IV glycopyrrolate 0.2 mg every 4 hours as needed.  Please continue foley catheter for comfort.   #  Psychosocial Support:  -daughter, SIL, grandsons  # Discharge Planning: Comfort focused care only. [place] liaison evaluating to determine acceptance for [facility].  Discussed with: patient, daughter, son-in-law, grandsons, hospitalist, [facility] liaison, bedside RN  Thank you for allowing the palliative care team to participate in the care [name].  Chelsea Aus, DO Palliative Care Provider PMT # [phone]  If patient remains symptomatic despite maximum doses, please call PMT at [phone] between 0700 and 1900. Outside of these hours, please call attending, as PMT does not have night coverage.  This provider spent a total of 54  minutes providing patient's care.  Includes review of EMR, discussing care with other staff members involved in patient's medical care, obtaining relevant history and information from patient and/or patient's family, and personal review of imaging and lab work. Greater than 50% of the time was spent counseling and coordinating care related to the above assessment and plan.    *Please note that this is a verbal dictation therefore any spelling or grammatical errors are due to the "[place] [employer]" system interpretation.

## 2022-04-25 NOTE — Progress Notes (Signed)
Chaplain engaged in an initial visit with Winnie, her daughter, and son-in-law.  Chaplain was able to establish relationship and build rapport with family. Chaplain also engaged in narrative/life review. Chaplain learned that Katrinna was born in [place], [place]. 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 She expressed that her daddy taught them the importance of working hard. She was also taught the importance of family. Her mom brought the family together through her exceptional cooking. Macle, who has one daughter, took on that [place] of having a close knit family. Leonah declared, "All you have is your family." Chaplain assessed that Floye has a great support system around her. She has two grandsons and two great grandchildren. She shared her love for them and how they affectionately call her "Bonnita Nasuti."   Jameika has also found a lot of energy for living through traveling throughout her life. She and her daughter talked about her many adventures around the [place]. Their last trip was to the beach last year which allowed Antania to be around all her grandchildren.   Naiyana recognizes that she no longer has an appetite and as she stated, "Time is moving." Chaplain offered a compassionate presence, support, and listening. Chaplain offered a blessing of comfort and peace over her. Family is aware that Chaplain support is available to them as they process Wrenly's next steps. Please page Chaplain if needed.   Chaplain Brittan Butterbaugh, MDiv  04/25/22 1000  Spiritual Encounters  Type of Visit Initial  Care provided to: Pt and family  Reason for visit Routine spiritual support  Spiritual Framework  Presenting Themes Community and relationships;Impactful experiences and emotions;Meaning/purpose/sources of inspiration  Community/Connection Family  Interventions  Spiritual Care Interventions Made Compassionate presence;Established relationship of care and support;Explored  values/beliefs/practices/strengths;Narrative/life review  Intervention Outcomes  Outcomes Awareness of support;Connection to spiritual care

## 2022-04-26 DIAGNOSIS — I5032 Chronic diastolic (congestive) heart failure: Secondary | ICD-10-CM | POA: Diagnosis not present

## 2022-04-26 DIAGNOSIS — K219 Gastro-esophageal reflux disease without esophagitis: Secondary | ICD-10-CM | POA: Diagnosis not present

## 2022-04-26 DIAGNOSIS — I4891 Unspecified atrial fibrillation: Secondary | ICD-10-CM | POA: Diagnosis not present

## 2022-04-26 DIAGNOSIS — J449 Chronic obstructive pulmonary disease, unspecified: Secondary | ICD-10-CM | POA: Diagnosis not present

## 2022-04-26 MED ORDER — AMIODARONE HCL 100 MG PO TABS
100.0000 mg | ORAL_TABLET | Freq: Every day | ORAL | Status: DC
  Administered 2022-04-26 – 2022-04-29 (×4): 100 mg via ORAL
  Filled 2022-04-26 (×4): qty 1

## 2022-04-26 MED ORDER — FAMOTIDINE 20 MG PO TABS: 20.0000 mg | ORAL_TABLET | Freq: Every day | ORAL | Status: DC | PRN

## 2022-04-26 NOTE — TOC Progression Note (Signed)
Transition of Care [facility]) - Progression Note    Patient Details  Name: [name] MRN: [id number] Date of Birth: [birth year]  Transition of Care [facility]) CM/SW [place], LCSW Phone Number: 04/26/2022, 10:55 AM  Clinical Narrative:    Per chart review pt is currently being considered for [facility]. TOC will continue to follow for d/c needs.     Expected Discharge Plan:  (unsure per pt's dtr [name]) Barriers to Discharge: Continued Medical Work up  Expected Discharge Plan and Services   Discharge Planning Services: CM Consult   Living arrangements for the past 2 months: [facility])                                       Social Determinants of Health (SDOH) Interventions SDOH Screenings   Food Insecurity: No Food Insecurity (04/20/2022)  Housing: Low Risk  (04/20/2022)  Transportation Needs: No Transportation Needs (04/20/2022)  Utilities: Not At Risk (04/20/2022)  Tobacco Use: Low Risk  (04/18/2022)    Readmission Risk Interventions    09/26/2019   10:56 AM  Readmission Risk Prevention Plan  Transportation Screening Complete  PCP or Specialist Appt within 3-5 Days Complete  HRI or [place] Complete  Social Work Consult for [place] Planning/Counseling Complete  Palliative Care Screening Not Applicable  Medication Review Press photographer) Complete

## 2022-04-26 NOTE — Progress Notes (Signed)
[facility])   Referral received on 3.04 for [facility] to evaluate patient for BP from PMT provider/Dr. Vinetta Bergamo. As of 3/5, patient was not requiring any ongoing symptom management and continued to have increased intake.   MSW contacted PMT provider/Dr. Rowe Pavy inquiring of her thoughts of patients appropriateness of BP evaluation. Dr. Rowe Pavy reports that patient is not a candidate for EOL and is more appropriate for SNF rehab transfer. MSW voiced understanding.  [facility] hospice to sign off but are available if needs arise.   Please call with any questions/concerns.    Thank you for the opportunity to participate in this patient's care.   Phillis Haggis, MSW [facility] Liaison  [phone]

## 2022-04-26 NOTE — Progress Notes (Signed)
Daily Progress Note   Patient Name: [name]       Date: 04/26/2022 DOB: [birth year]  Age: 88 y.o. MRN#: [id number] Attending Physician: Patrecia Pour, MD Primary Care Physician: Garwin Brothers, MD Admit Date: 04/18/2022 Length of Stay: 7 days  Reason for Consultation/Follow-up: Establishing goals of care and Symptom Management  Subjective:   CC: Patient alert conversant and awake, daughter at bedside, sitting up in bed, no distress.    Subjective:  Reviewed EMR prior to seeing patient.  Patient's blood pressure improving to 140's / 60's today. Also discussed care with care team throughout the day including [facility] hospice liaison and Dr Bonner Puna.   Patient is awake alert, asking for soup, daughter at bedside.     All questions answered at that time.  Provided emotional support via active listening.    Review of Systems Denies any symptoms of concern Objective:   Vital Signs:  BP (!) 140/64 (BP Location: Left Arm)   Pulse 62   Temp 97.6 F (36.4 C) (Oral)   Resp 16   Ht '5\' 3"'$  (1.6 m)   Wt 58 kg   LMP 02/22/1983 (Approximate)   SpO2 96%   BMI 22.65 kg/m   Physical Exam: General: NAD, awake, pleasant, chronically ill-appearing Eyes: No drainage noted HENT: moist mucous membranes Cardiovascular: RRR Respiratory: no increased work of breathing noted, not in respiratory distress Abdomen: not distended Extremities: Muscle wasting present in all extremities Skin: no rashes or lesions on visible skin; review of EMR noted stage III sacral wound present on admission Neuro: awake, interactive, short term memory poor Psych: pleasant  Imaging:  I personally reviewed recent imaging.   Assessment & Plan:   Assessment: Patient is an 88 year old female with a past medical history of A-fib, PSVT, sarcoidosis, scoliosis, hypertension, hyperlipidemia, COPD/bronchiectasis, CAD, HFpEF, and GERD who was admitted on 04/18/2022 from a nursing facility for management of weakness, fatigue,  and decreased appetite.  Patient had recently been admitted for COPD exacerbation with positive RSV.  During this admission, patient was found to be in A-fib with RVR and required medication for management.  Patient noted to have suspected esophageal stenosis and so GI was consulted.  No EGD recommended by GI at this time due to concerns for risk over benefits of procedure.  Also needing management for acute urinary retention requiring Foley placement.  Palliative medicine team consulted to assist with complex medical decision making.   Recommendations/Plan: # Complex medical decision making/goals of care  -DNR, continue current mode of care, monitor PO intake and attempts with PT OT   # Symptom management:    -Pain/Dyspnea, acute in the setting of end-of-life care                               -Continue IV morphine 1-3 mg IV every 30 mins as needed.  Continue to adjust based on patient's symptom burden.  If patient needing frequent dosing, may need to consider continuous infusion.                  -Anxiety/agitation, in the setting of end-of-life care                               -Continue IV Ativan 0.5 mg every 4 hours as needed. Continue to adjust based on patient's symptom burden.                                 -  Continue  IV Haldol 2 mg every 4 hours as needed. Continue to adjust based on patient's symptom burden.                   -Secretions, in the setting of end-of-life care                               -Continue IV glycopyrrolate 0.2 mg every 4 hours as needed.  Please continue foley catheter for comfort.   # Psychosocial Support:  -daughter,    # Discharge Planning: Comfort focused care only.   Discussed with: patient, daughter, son-in-law,  hospitalist, [facility] liaison, bedside RN  Thank you for allowing the palliative care team to participate in the care [name].  Mod MDM Loistine Chance MD Palliative Care Provider PMT # [phone]  If patient remains symptomatic  despite maximum doses, please call PMT at [phone] between 0700 and 1900. Outside of these hours, please call attending, as PMT does not have night coverage.  This provider did a review of EMR, discussing care with other staff members involved in patient's medical care, obtaining relevant history and information from patient and/or patient's family, and personal review of imaging and lab work. Greater than 50% of the time was spent counseling and coordinating care related to the above assessment and plan.

## 2022-04-26 NOTE — Progress Notes (Signed)
[facility] PROGRESS NOTE  [name] (DOB: [birth year]) [id number] PCP: Garwin Brothers, MD  Brief Narrative: [name] is an 88 year old female with history of A-fib, PSVT, sarcoidosis, scoliosis, hypertension, HLD, COPD/bronchiectasis, coronary artery disease, diastolic CHF, GERD who presented from nursing facility with complaint of weakness, fatigue, decreased appetite.  She was recently admitted here for COPD exacerbation with RSV and was discharged on oxygen supplementation.  During that time, hospital course was completed by A-fib with RVR and was on amiodarone.  Patient having gradual decline.  She was also treated for aspiration pneumonia in the facility.  On presentation, she was tachycardic in the range of 140, rhythm showed A-fib, she was mildly hypertensive.  Lab work showed WBC of 14.8. Cardiology consulted. GI consulted for concern of esophageal dysmotility/stricture/hiatal hernia. Palliative care also consulted and family meetings have been ongoing.   Throughout hospitalization, the patient has remained frail appearing and does not appear to have the desire for rehabilitation (declining PT at times). Despite unrestricted diet and frequent encouragement, removal of all modifiable barriers to oral intake, this has remained poor, requiring IV fluids with dextrose due to hypoglycemia. Hypotension has been persistent despite treating dehydration and in the absence of bleeding, adrenal insufficiency, or infection. In the face of this continued decline, the decision to transition to comfort measures has been made.   Subjective: Again feeling well, thanks [place] for getting her better, ready to get out of bed  Objective: BP (!) 140/64 (BP Location: Left Arm)   Pulse 62   Temp 97.6 F (36.4 C) (Oral)   Resp 16   Ht '5\' 3"'$  (1.6 m)   Wt 58 kg   LMP 02/22/1983 (Approximate)   SpO2 96%   BMI 22.65 kg/m   Frail elderly but now lively female in no distress RRR, no edema Clear,  nonlabored Broad affect, euthymic, interactive  Assessment & Plan: Principal Problem:   Atrial fibrillation with RVR (HCC) Active Problems:   CAD (coronary artery disease)   Leukocytosis   Chronic heart failure with preserved ejection fraction (HCC)   COPD (chronic obstructive pulmonary disease) (HCC)   GERD (gastroesophageal reflux disease)   Hypotension   Failure to thrive in adult   Chronic hypoxic respiratory failure (HCC)   Acute urinary retention   A-fib (HCC)   Malnutrition of moderate degree   Palliative care encounter   Goals of care, counseling/discussion   Counseling and coordination of care   Pleural effusion on left   Need for emotional support   High risk medication use   Pain   DNR (do not resuscitate)   End of life care   Dyspnea  A-fib with RVR:  - Converted back to NSR with amiodarone. With transition to comfort measures, this was stopped. We will restart it to avoid symptoms related to reversion to AFib.  - Stopped eliquis with suggestion of lower GI bleeding.  - BP was low, but may have spontaneously improved. Will monitor.  Chronic hypoxic respiratory failure/COPD: History of COPD, pulmonary sarcoidosis, and recent admission for RSV. Has been on 3L since that time, though may not need that much.  - Titrated down from oxygen at this time.   Suspected esophageal stenosis:  - Continue diet as tolerated. Unrestricted. - GI consulted, risk of endoscopy felt to be prohibitive.    Chronic diastolic CHF: Appears euvolemic. CT chest showed increasing bilateral pleural effusion worse on the left.   - Diuretics on hold due to soft blood pressure. Maintenance IVF  started having some edema. Stopped with transition to comfort measures. Does not appear overloaded at this time.    Coronary artery disease: No anginal symptoms.   Leukocytosis: Resolved as of 3/1. No current infectious signs/symptoms. UA without leuk esterase.  - ?if related to silent microaspiration.  Will monitor clinically without antibiotics at this time.    GERD:  - Continue PPI.   Hypokalemia: Resolved with supplementation.   Acute urinary retention, OAB: Has history of retention but also significant OAB with incontinence for which urology has been treating her with oxybutynin, gemtesa. Found to have 1L retention this admission, foley placed.  - Failed voiding trial 3/1, replaced foley which we will continue. - Holding OAB medications   Generalized decline/failure to thrive, moderate protein calorie malnutrition: Increasing frequency of admissions and family notes she is at times sleeping all day, wants curtains drawn, declining to work with PT. Has no appetite and when she does eat, it's just fluids mostly. CT head/spine, CTA chest done on presentation did not show any infectious or metabolic cause for decline.  - RD consulted, will supplement protein as able - Family has met with palliative care to discuss current and future goals of care. We transitioned to comfort measures after the patient had many days of lethargy, inadequate oral intake and hypotension and hypoglycemia requiring medication and IVF support. Once we stopped this support, the patient seems to have perked up. She is eating some (it remains to be seen whether this is truly sufficient) and willing to attempt rehabilitation (has not yet actually gotten up out of bed). - I have requested repeat physical therapy evaluation.   Hypoglycemia: Due to malnutrition, poor oral intake. Cortisol wnl. - Push oral intake.    Patrecia Pour, MD [facility] [email or web address] 04/26/2022, 2:31 PM

## 2022-04-27 DIAGNOSIS — L8990 Pressure ulcer of unspecified site, unspecified stage: Secondary | ICD-10-CM | POA: Insufficient documentation

## 2022-04-27 DIAGNOSIS — I4891 Unspecified atrial fibrillation: Secondary | ICD-10-CM | POA: Diagnosis not present

## 2022-04-27 DIAGNOSIS — E876 Hypokalemia: Secondary | ICD-10-CM | POA: Insufficient documentation

## 2022-04-27 LAB — BASIC METABOLIC PANEL
Anion gap: 6 (ref 5–15)
BUN: 11 mg/dL (ref 8–23)
CO2: 21 mmol/L — ABNORMAL LOW (ref 22–32)
Calcium: 7.7 mg/dL — ABNORMAL LOW (ref 8.9–10.3)
Chloride: 109 mmol/L (ref 98–111)
Creatinine, Ser: 0.68 mg/dL (ref 0.44–1.00)
GFR, Estimated: >60 mL/min (ref >60)
Glucose, Bld: 86 mg/dL (ref 70–99)
Potassium: 3.8 mmol/L (ref 3.5–5.1)
Sodium: 136 mmol/L (ref 135–145)

## 2022-04-27 MED ORDER — MOMETASONE FURO-FORMOTEROL FUM 100-5 MCG/ACT IN AERO
2.0000 | INHALATION_SPRAY | Freq: Two times a day (BID) | RESPIRATORY_TRACT | Status: DC
  Administered 2022-04-27 – 2022-04-29 (×4): 2 via RESPIRATORY_TRACT
  Filled 2022-04-27: qty 8.8

## 2022-04-27 MED ORDER — APIXABAN 2.5 MG PO TABS
2.5000 mg | ORAL_TABLET | Freq: Two times a day (BID) | ORAL | Status: DC
  Administered 2022-04-27 – 2022-04-29 (×4): 2.5 mg via ORAL
  Filled 2022-04-27 (×4): qty 1

## 2022-04-27 NOTE — Progress Notes (Signed)
Daily Progress Note   Patient Name: [name]       Date: 04/27/2022 DOB: 26-Sep-1934  Age: 88 y.o. MRN#: [id number] Attending Physician: Edwin Dada, * Primary Care Physician: Garwin Brothers, MD Admit Date: 04/18/2022 Length of Stay: 8 days  Reason for Consultation/Follow-up: Establishing goals of care and Symptom Management  Subjective:   CC: Patient alert conversant and awake, daughter at bedside, sitting up in bed, no distress. Looks somewhat more tired than yesterday. Denies any specific complaints.    Subjective:  Reviewed EMR prior to seeing patient.  Patient's blood pressure 134/61 today. Also discussed care with care team including TRH MD, RN colleague and have also corresponded with [facility] colleague.      All questions answered at that time.  Provided emotional support via active listening.    Review of Systems Denies any symptoms of concern Objective:   Vital Signs:  BP 134/61 (BP Location: Right Arm)   Pulse 63   Temp 98.2 F (36.8 C) (Oral)   Resp 20   Ht '5\' 3"'$  (1.6 m)   Wt 58 kg   LMP 02/22/1983 (Approximate)   SpO2 96%   BMI 22.65 kg/m   Physical Exam: General: NAD, awake, pleasant, chronically ill-appearing Eyes: No drainage noted HENT: moist mucous membranes Cardiovascular: RRR Respiratory: no increased work of breathing noted, not in respiratory distress Abdomen: not distended Extremities: Muscle wasting present in all extremities Skin: no rashes or lesions on visible skin; review of EMR noted stage III sacral wound present on admission Neuro: awake, interactive, short term memory poor Psych: pleasant  Imaging:  I personally reviewed recent imaging.   Assessment & Plan:   Assessment: Patient is an 88 year old female with a past medical history of A-fib, PSVT, sarcoidosis, scoliosis, hypertension, hyperlipidemia, COPD/bronchiectasis, CAD, HFpEF, and GERD who was admitted on 04/18/2022 from a nursing facility for management of weakness,  fatigue, and decreased appetite.  Patient had recently been admitted for COPD exacerbation with positive RSV.  During this admission, patient was found to be in A-fib with RVR and required medication for management.  Patient noted to have suspected esophageal stenosis and so GI was consulted.  No EGD recommended by GI at this time due to concerns for risk over benefits of procedure.  Also needing management for acute urinary retention requiring Foley placement.  Palliative medicine team consulted to assist with complex medical decision making.   Recommendations/Plan: # Complex medical decision making/goals of care  -DNR, continue current mode of care, monitor PO intake and attempts with PT OT   # Symptom management:    -Pain/Dyspnea and other non pain symptoms.                                -Continue IV morphine 1-3 mg IV every 30 mins as needed.                     -Anxiety/agitation,                                -Continue IV Ativan 0.5 mg every 4 hours as needed. Continue to adjust based on patient's symptom burden.                                 -Continue  IV Haldol  2 mg every 4 hours as needed. Continue to adjust based on patient's symptom burden.     Please continue foley catheter for comfort.   # Psychosocial Support:  -daughter,    # Discharge Planning: Comfort focused care only, however, patient has increased PO intake, has participated with PT and current plan is in favor of SNF rehab attempt with palliative services following.   Discussed with: patient, daughter,    hospitalist, TOC bedside RN  Thank you for allowing the palliative care team to participate in the care [name].  Mod MDM Loistine Chance MD Palliative Care Provider PMT # [phone]  If patient remains symptomatic despite maximum doses, please call PMT at [phone] between 0700 and 1900. Outside of these hours, please call attending, as PMT does not have night coverage.  This provider did a review  of EMR, discussing care with other staff members involved in patient's medical care, obtaining relevant history and information from patient and/or patient's family, and personal review of imaging and lab work. Greater than 50% of the time was spent counseling and coordinating care related to the above assessment and plan.

## 2022-04-27 NOTE — Hospital Course (Addendum)
88 y.o. F with pAF, sarcoid, HTN, COPD, dCHF and recent RSV who presented from acute rehab with weakness fatigue, swelling and poor oral intake.    In the ER, Afib in the 140s, WBC 14.

## 2022-04-27 NOTE — Evaluation (Signed)
Physical Therapy Re-Evaluation Patient Details Name: [name] MRN: [id number] DOB: [birth year] Today's Date: 04/27/2022  History of Present Illness  Pt is an 88 y/o F admitted on 04/18/22 after presenting from SNF with c/o weakness, fatigue, & decreased appetite. Pt was recently admitted for COPD exacerbation & RSV & d/c home on supplemental O2 & that hospitalization was complicated by a-fib with RVR. Pt was tx for aspiration PNA in the facility. On this admission, pt was tachycardic & mildly hypertensive. GI was consulted for concern of esophageal dysmotility/stricture/hiatal hernia. Over the course of hospitalization comfort care was initiated but then pt seems to have perked up. PT has been re-consulted for re-evaluation. PMH: a-fib, PSVT, sarcoidosis, scoliosis, HTN, COPD/bronchiectasis, CAD, diastolic CHF, GERD   Clinical Impression  Pt seen for PT re-evaluation with pt's daughter [name]) present in room. Pt is willing to participate but requires assistance for all mobility.  Pt is able to complete bed mobility with min assist but reliance on hospital bed features, STS with mod assist, step pivot with RW & min assist. Pt endorses feeling "give out" with supine>sit, dizziness upon sitting EOB that slowly improves during session, but does note SOB & "chest heaviness" upon sitting EOB that continues. Nurse in room & cleared pt to transfer to recliner, SpO2 >90% on room air, HR 67-71 bpm. PT educated pt on pursed lip breathing. Pt left in recliner in care of nurse; pt encouraged to sit in recliner daily.   Recommendations for follow up therapy are one component of a multi-disciplinary discharge planning process, led by the attending physician.  Recommendations may be updated based on patient status, additional functional criteria and insurance authorization.  Follow Up Recommendations Skilled nursing-short term rehab (<3 hours/day) Can patient physically be transported by private vehicle: No     Assistance Recommended at Discharge Frequent or constant Supervision/Assistance  Patient can return home with the following  A lot of help with walking and/or transfers;A lot of help with bathing/dressing/bathroom;Assist for transportation;Help with stairs or ramp for entrance;Assistance with cooking/housework    Equipment Recommendations None recommended by PT (TBD in next venue)  Recommendations for Other Services  OT consult    Functional Status Assessment Patient has had a recent decline in their functional status and demonstrates the ability to make significant improvements in function in a reasonable and predictable amount of time.     Precautions / Restrictions Precautions Precautions: Fall Restrictions Weight Bearing Restrictions: No      Mobility  Bed Mobility Overal bed mobility: Needs Assistance Bed Mobility: Supine to Sit     Supine to sit: Min assist, HOB elevated     General bed mobility comments: cuing to use bed rails, HOB elevated, extra time    Transfers Overall transfer level: Needs assistance Equipment used: Rolling walker (2 wheels) Transfers: Sit to/from Stand, Bed to chair/wheelchair/BSC Sit to Stand: Mod assist, From elevated surface   Step pivot transfers: Min assist (with RW, cuing for sequencing & to completeoy turn then reach back for armrests during stand>sit)       General transfer comment: Education re: hand placement, assistance to power up.    Ambulation/Gait                  Stairs            Wheelchair Mobility    Modified Rankin (Stroke Patients Only)       Balance Overall balance assessment: Needs assistance Sitting-balance support: Feet supported, Bilateral upper extremity supported  Sitting balance-Leahy Scale: Fair Sitting balance - Comments: supervision static sitting   Standing balance support: Bilateral upper extremity supported, During functional activity, Reliant on assistive device for  balance Standing balance-Leahy Scale: Poor                               Pertinent Vitals/Pain Pain Assessment Pain Assessment: Faces Faces Pain Scale: Hurts a little bit Pain Location: buttocks Pain Descriptors / Indicators: Sore Pain Intervention(s): Monitored during session, Repositioned    Home Living Family/patient expects to be discharged to:: Skilled nursing facility Living Arrangements: Children Available Help at Discharge: Family;Available 24 hours/day Type of Home: House Home Access: Stairs to enter   [employer] of Steps: 1   Home Layout: Two level;Able to live on main level with bedroom/bathroom Home Equipment: Rolling Walker (2 wheels);BSC/3in1;Shower seat Additional Comments: daughter works from home, information obtained from previous entry    Prior Function Prior Level of Function : Needs assist (information obtained from previous admission)             Mobility Comments: prior to last admission, was using RW for ambulation .Since hospital admission and brief SNF stay, limited mobility and ability to participate at SNF ADLs Comments: prior to last admission and SNF stay, was independent.     Hand Dominance        Extremity/Trunk Assessment   Upper Extremity Assessment Upper Extremity Assessment: Generalized weakness    Lower Extremity Assessment Lower Extremity Assessment: Generalized weakness    Cervical / Trunk Assessment Cervical / Trunk Assessment: Kyphotic (scoliosis)  Communication   Communication: HOH  Cognition Arousal/Alertness: Awake/alert Behavior During Therapy: WFL for tasks assessed/performed Overall Cognitive Status: Within Functional Limits for tasks assessed                                          General Comments      Exercises     Assessment/Plan    PT Assessment Patient needs continued PT services  PT Problem List Decreased strength;Decreased balance;Decreased activity  tolerance;Decreased range of motion;Decreased mobility;Decreased knowledge of use of DME;Cardiopulmonary status limiting activity;Decreased safety awareness       PT Treatment Interventions DME instruction;Therapeutic exercise;Gait training;Balance training;Stair training;Neuromuscular re-education;Patient/family education;Therapeutic activities    PT Goals (Current goals can be found in the Care Plan section)  Acute Rehab PT Goals Patient Stated Goal: get better, stronger, go to rehab PT Goal Formulation: With patient/family Time For Goal Achievement: 05/11/22 Potential to Achieve Goals: Good    Frequency Min 2X/week     Co-evaluation               AM-PAC PT "6 Clicks" Mobility  Outcome Measure Help needed turning from your back to your side while in a flat bed without using bedrails?: A Little Help needed moving from lying on your back to sitting on the side of a flat bed without using bedrails?: A Lot Help needed moving to and from a bed to a chair (including a wheelchair)?: A Lot Help needed standing up from a chair using your arms (e.g., wheelchair or bedside chair)?: A Lot Help needed to walk in hospital room?: A Lot Help needed climbing 3-5 steps with a railing? : Total 6 Click Score: 12    End of Session   Activity Tolerance: Patient limited by fatigue Patient left:  in chair;with call bell/phone within reach;with chair alarm set;with family/visitor present;with nursing/sitter in room Nurse Communication: Mobility status (pt's c/o chest heaviness during session) PT Visit Diagnosis: Muscle weakness (generalized) (M62.81);Difficulty in walking, not elsewhere classified (R26.2);Other abnormalities of gait and mobility (R26.89);Unsteadiness on feet (R26.81)    Time: [id number] PT Time Calculation (min) (ACUTE ONLY): 12 min   Charges:   PT Evaluation $PT Re-evaluation: 1 Re-eval          Lavone Nian, PT, DPT 04/27/22, 12:29 PM   Waunita Schooner 04/27/2022, 12:27 PM

## 2022-04-27 NOTE — TOC Progression Note (Addendum)
Transition of Care [facility]) - Progression Note    Patient Details  Name: [name] MRN: [id number] Date of Birth: May 15, 1934  Transition of Care [facility]) CM/SW [place], LCSW Phone Number: 04/27/2022, 9:17 AM  Clinical Narrative:    CSW spoke with pt's daughter regarding SNF placement. She provided a list of facilities she would like pt to discharge to. CSW explained the process was started already and provided SNF beds offers. Pt's daughter would like [facility] to assess. While she looks into facilities that offer her mother a bed.  9:45am CSW spoke to [place] with [facility] and provided update , she stated "pt should build her strength back up before she come back to ALF level".TOC to follow.    ADDEN CSW reached out to [facility] - left VM [facility] - able to accept but will be Friday semi private room [place] [facility]- offered bed private room [place]- semi private room tomorrow.  CSW informed pt's daughter of beds offers. Pt's daughter reported she will be [facility] today , and will call CSW back with her decision. TOC to follow.     Expected Discharge Plan:  (unsure per pt's dtr [name]) Barriers to Discharge: Continued Medical Work up  Expected Discharge Plan and Services   Discharge Planning Services: CM Consult   Living arrangements for the past 2 months: [facility])                                       Social Determinants of Health (SDOH) Interventions SDOH Screenings   Food Insecurity: No Food Insecurity (04/20/2022)  Housing: Low Risk  (04/20/2022)  Transportation Needs: No Transportation Needs (04/20/2022)  Utilities: Not At Risk (04/20/2022)  Tobacco Use: Low Risk  (04/18/2022)    Readmission Risk Interventions    09/26/2019   10:56 AM  Readmission Risk Prevention Plan  Transportation Screening Complete  PCP or Specialist Appt within 3-5 Days Complete  HRI or [place] Complete  Social Work Consult for [place] Planning/Counseling Complete  Palliative Care Screening Not Applicable  Medication Review Press photographer) Complete

## 2022-04-27 NOTE — Progress Notes (Signed)
  Progress Note   Patient: [name] [id number] DOB: [birth year] DOA: 04/18/2022     8 DOS: the patient was seen and examined on 04/27/2022 at 11:19AM      Brief hospital course: 88 y.o. F with pAF, sarcoid, HTN, COPD, dCHF and recent RSV who presented from acute rehab with weakness fatigue, swelling and poor oral intake.    In the ER, Afib in the 140s, WBC 14.        Assessment and Plan: * Atrial fibrillation with RVR (HCC) HR controlled - Continue home amiodarone - Resume Eliquis  Acute urinary retention On admission, had difficulty urinating, had foley placed with over 1L return, subsequently breathing normalized and she was weaned off O2.   UA ruled out infection  GERD (gastroesophageal reflux disease) -continue PPI. CT A/P shows esophagitis.  -Pt has been having low appetite. Has not been complaining of dysphagia but will obtain speech therapy to evaluate.   Malnutrition of moderate degree    Failure to thrive in adult    Hypotension Intermittent soft BP secondary to a.fib with RVR -on amiodarone infusion and holding all antihypertensives  -continue to monitor   Chronic diastolic CHF (congestive heart failure) (HCC) Stable at baseline  Dysphagia due to suspected esophageal stenosis Eating well today  COPD (chronic obstructive pulmonary disease) (HCC) No wheezing Respiratory failure ruled out. - Resume home ICS/LABA  CAD (coronary artery disease) - Hold aspirin          Subjective: Feeling okay, endorses ongoing dyspnea which is unchanged over last 6 months, worse with exertion, no fever, no significant cough, no wheezing.  No nursing concerns.  Oral intake, mentation notably better in last 24 to 48 hours     Physical Exam: BP (!) 102/55 (BP Location: Right Arm)   Pulse 70   Temp 98 F (36.7 C) (Oral)   Resp (!) 22   Ht '5\' 3"'$  (1.6 m)   Wt 58 kg   LMP 02/22/1983 (Approximate)   SpO2 98%   BMI 22.65 kg/m   Thin elderly female,  lying in bed, no acute distress RRR, no murmurs, no peripheral edema, no JVD Respiratory rate normal, lungs clear without rales or wheezes Abdomen soft no tenderness palpation or guarding, no ascites or distention Attention normal, affect normal, judgment insight appear normal,.  Overall tired but oriented and follows commands well    Data Reviewed: Discussed with palliative care Basic metabolic panel unremarkable   Family Communication: Daughter at the bedside    Disposition: Status is: Inpatient From discussion with palliative care, evidently the patient appeared clinically worse and had poor oral intake earlier during hospital stay, and decision was made to transition to comfort care.  However in the last 48 hours, she has had good mentation, oral intake, and does not appear to be appropriate for residential hospice.  She is actually weaned off of oxygen and this hospital stay, but appears to have reasonable capacity for rehabilitation and to return to her prior level of function          Author: Edwin Dada, MD 04/27/2022 5:01 PM  For on call review [email or web address].

## 2022-04-27 NOTE — Progress Notes (Signed)
Nutrition Brief Note  Chart reviewed. Pt now transitioning to comfort care.  No further nutrition interventions planned at this time.  Please re-consult as needed.     Melburn Treiber RD, LDN For contact information, refer to AMiON.   

## 2022-04-28 ENCOUNTER — Inpatient Hospital Stay (HOSPITAL_COMMUNITY): Payer: Medicare Other

## 2022-04-28 LAB — RESPIRATORY PANEL BY PCR

## 2022-04-28 LAB — RESP PANEL BY RT-PCR (RSV, FLU A&B, COVID)  RVPGX2
Influenza A by PCR: NEGATIVE
Influenza B by PCR: NEGATIVE
Resp Syncytial Virus by PCR: NEGATIVE
SARS Coronavirus 2 by RT PCR: NEGATIVE

## 2022-04-28 MED ORDER — AZITHROMYCIN 250 MG PO TABS
500.0000 mg | ORAL_TABLET | Freq: Every day | ORAL | Status: AC
  Administered 2022-04-28: 500 mg via ORAL
  Filled 2022-04-28: qty 2

## 2022-04-28 MED ORDER — AMOXICILLIN-POT CLAVULANATE 875-125 MG PO TABS
1.0000 | ORAL_TABLET | Freq: Two times a day (BID) | ORAL | Status: DC
  Administered 2022-04-28 (×2): 1 via ORAL
  Filled 2022-04-28 (×2): qty 1

## 2022-04-28 MED ORDER — AZITHROMYCIN 250 MG PO TABS: 250.0000 mg | ORAL_TABLET | Freq: Every day | ORAL | Status: DC

## 2022-04-28 MED ORDER — FUROSEMIDE 20 MG PO TABS
20.0000 mg | ORAL_TABLET | Freq: Once | ORAL | Status: AC
  Administered 2022-04-28: 20 mg via ORAL
  Filled 2022-04-28: qty 1

## 2022-04-28 NOTE — Care Management Important Message (Signed)
Important Message  Patient Details IM Letter given  Name: [name] MRN: [id number] Date of Birth: June 15, 1934   Medicare Important Message Given:  Yes     Kerin Salen 04/28/2022, 10:42 AM

## 2022-04-28 NOTE — Progress Notes (Signed)
  Progress Note   Patient: [name] [id number] DOB: [birth year] DOA: 04/18/2022     9 DOS: the patient was seen and examined on 04/28/2022        Brief hospital course: 88 y.o. F with pAF, sarcoid, HTN, COPD, dCHF and recent RSV who presented from acute rehab with weakness fatigue, swelling and poor oral intake.    In the ER, Afib in the 140s, WBC 14.        Assessment and Plan: * Atrial fibrillation with RVR (HCC) - Continue amiodarone, Eliquis  Acute urinary retention - Maintain foley  Lobar pneumonia, possible This afternoon, notified that patient has new runny nose, cough.  CXR repeated, poor quality, on my review shows no new findings, but per Radiology has new faint LUL opacity.  Will err on side of treatment Mentating at baseline, taking orals, afebrile, HR normal, RR normal, SpO2 at baseline.  Stable for outpatient treatment.   - Check RVP - Start Augmentin, azithromycin - Encouraged IS  Chronic diastolic CHF (congestive heart failure) (HCC) Stable at baseline  COPD (chronic obstructive pulmonary disease) (HCC) No wheezing Respiratory failure ruled out. - Continue home ICS/LABA  CAD (coronary artery disease) - Hold aspirin          Subjective: To me, earlier in the day, patietn feeling well, no fever, no cough, no confusion, no dyspnea.  Later in the day, reporting to family she has runny nose and cough.       Physical Exam: BP (!) 106/48 (BP Location: Left Arm)   Pulse 66   Temp 98.1 F (36.7 C) (Oral)   Resp 20   Ht '5\' 3"'$  (1.6 m)   Wt 58 kg   LMP 02/22/1983 (Approximate)   SpO2 95%   BMI 22.65 kg/m   Thin elderly female, lying in bed, no acute distress. RRR no murmurs, no LE edema Respiratory rate normal, lungs clear wtihout rales or wheezes  Abdomen soft without tenderness to palpation, ribs normal, no point tendenress on left. Attention normal, affect normal, judgment and insight appear normal, face symmetric, speech  fluent     Data Reviewed: CXR personally reviewed, shows no airspace disease or opacity.  Radiology report implies LUL opacity.   The mentioned rib fractures on CXR do not correlate with clinical exam, this is ruled out   Family Communication: Daughter    Disposition: Status is: Inpatient Will need to obtain COVID swab today, but if this is negative, I feel she is stable for discharge.        Author: Edwin Dada, MD 04/28/2022 1:52 PM  For on call review [email or web address].

## 2022-04-28 NOTE — Assessment & Plan Note (Signed)
-   Start augmentin and azithromycin

## 2022-04-28 NOTE — TOC Progression Note (Addendum)
[place];Transition of Care [facility]) - Progression Note    Patient Details  Name: [name] MRN: [id number] Date of Birth: 12-Mar-1934  [facility]) CM/SW Contact  Henrietta Dine, RN Phone Number: 04/28/2022, 9:43 AM  Clinical Narrative:    Called pt's dtr [name] [phone]) to discuss bed choice; she says she is going to visit [facility]; explained time-sensitivity of choice; she says she will be in hospital this afternoon and will give choice at that time; Whitney at [facility] notified.  -[id number]- spoke w/ pt and dtr in room; pt's dtr says she spoke w/ Merrily Pew, Development worker, international aid at [facility] and he says a private room will be available on Sunday 05/01/22; they would like to accept this bed offer instead on previously requested/listed offers; will verify bed offer w/ facility; Dr Loleta Books notified via secure chat.  -1255-called Champion, Development worker, international aid at [facility]; he says private room will be available on Sunday 05/01/22, but pt can admit to semi-private today and transfer to private room on Sunday; he gave room # 609A, call report # [phone]; notified pt's dtr [name] and she says pt is not ready to d/c; she says pt has labs/films pending and she will appeal d/c; this TOC  verbalized understanding of her concerns and explained MD will determine medical stability for d/c; Dr Loleta Books notified and he will speak w/ pt and dtr; notified Whitney at [facility] room declined.   Expected Discharge Plan:  (unsure per pt's dtr [name]) Barriers to Discharge: Continued Medical Work up  Expected Discharge Plan and Services   Discharge Planning Services: CM Consult   Living arrangements for the past 2 months: [place])                                       Social Determinants of Health (SDOH) Interventions SDOH Screenings   Food Insecurity: No Food Insecurity (04/20/2022)  Housing: Low Risk  (04/20/2022)   Transportation Needs: No Transportation Needs (04/20/2022)  Utilities: Not At Risk (04/20/2022)  Tobacco Use: Low Risk  (04/18/2022)    Readmission Risk Interventions    09/26/2019   10:56 AM  Readmission Risk Prevention Plan  Transportation Screening Complete  PCP or Specialist Appt within 3-5 Days Complete  HRI or [place] Complete  Social Work Consult for [place] Planning/Counseling Complete  Palliative Care Screening Not Applicable  Medication Review Press photographer) Complete

## 2022-04-29 LAB — BASIC METABOLIC PANEL
Anion gap: 9 (ref 5–15)
BUN: 15 mg/dL (ref 8–23)
CO2: 22 mmol/L (ref 22–32)
Calcium: 7.9 mg/dL — ABNORMAL LOW (ref 8.9–10.3)
Chloride: 103 mmol/L (ref 98–111)
Creatinine, Ser: 0.68 mg/dL (ref 0.44–1.00)
GFR, Estimated: >60 mL/min (ref >60)
Glucose, Bld: 97 mg/dL (ref 70–99)
Potassium: 3.6 mmol/L (ref 3.5–5.1)
Sodium: 134 mmol/L — ABNORMAL LOW (ref 135–145)

## 2022-04-29 MED ORDER — AMIODARONE HCL 100 MG PO TABS: 100.0000 mg | ORAL_TABLET | Freq: Every day | ORAL | Status: DC

## 2022-04-29 NOTE — TOC Progression Note (Addendum)
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  -[id number]- spoke w/Josh;  notified him pt + rhinovirus; he says pt can admit today to [facility] (private) after 1 pm; call report # [phone].  Expected Discharge Plan:  (unsure per pt's dtr [name]) Barriers to Discharge: Continued Medical Work up  Expected Discharge Plan and Services   Discharge Planning Services: CM Consult   Living arrangements for the past 2 months: [facility]) Expected Discharge Date: 04/29/22                                     Social Determinants of Health (SDOH) Interventions SDOH Screenings   Food Insecurity: No Food Insecurity (04/20/2022)  Housing: Low Risk  (04/20/2022)  Transportation Needs: No Transportation Needs (04/20/2022)  Utilities: Not At Risk (04/20/2022)  Tobacco Use: Low Risk  (04/18/2022)    Readmission Risk Interventions    09/26/2019   10:56 AM  Readmission Risk Prevention Plan  Transportation Screening Complete  PCP or Specialist Appt within 3-5 Days Complete  HRI or [place] Complete  Social Work Consult for [place] Planning/Counseling Complete  Palliative Care Screening Not Applicable  Medication Review [employer] photographer) Complete

## 2022-04-29 NOTE — Discharge Summary (Signed)
Physician Discharge Summary   Patient: [name] MRN: [id number] DOB: [birth year]  Admit date:     04/18/2022  Discharge date: 04/29/22  Discharge Physician: Edwin Dada   PCP: Garwin Brothers, MD     Recommendations at discharge:  Follow up with [facility] Dr. Claudia Desanctis within 1 week Recommend referral to outpatient Palliative Care at discharge     Discharge Diagnoses: Principal Problem:   Atrial fibrillation with RVR ([place]) Active Problems:   Acute urinary retention   CAD (coronary artery disease)   Rhinovirus pneumonia (HCC)   COPD (chronic obstructive pulmonary disease) (HCC)   Dysphagia due to suspected esophageal stenosis   Chronic diastolic CHF (congestive heart failure) (HCC)   Failure to thrive in adult   Malnutrition of moderate degree   Pleural effusion on left   Pressure injury of skin   Hypokalemia        Hospital Course: 88 y.o. F with pAF, sarcoid, HTN, COPD, dCHF and recent RSV who presented from acute rehab with weakness fatigue, swelling and poor oral intake.    In the ER, Afib in the 140s, WBC 14.      * Atrial fibrillation with RVR ([place]) Admitted on amiodarone, Cardiology consulted, heart rate controlled.   Acute urinary retention On admission, had difficulty urinating, had foley placed with over 1L return, subsequently breathing normalized and she was weaned off O2.   UA ruled out infection  Oxybutynin and mirabegron stopped at discharge, foley in place.  Needs Urology follow up within 1 week for voiding trial.  Dysphagia due to suspected esophageal stenosis At time of discharge, oral intake was appropriate.  COPD (chronic obstructive pulmonary disease) (HCC) No wheezing, respiratory failure ruled out.  Had transient hypoxia in setting of COPD, urinary retention and Afib. Stable on room air prior to discharge.     Rhinovirus pneumonia Bacterial pneumonia ruled out.  Patient with new cough and runny nose day prior to  discharge.  CXR showed faint LUL opacity, and RVP confirmed this was a viral infection with rhinovirus. - Supportive cares           The [place] was reviewed for this patient prior to discharge.  Consultants: Cardiology, Palliative Care   Disposition: Skilled nursing facility   DISCHARGE MEDICATION: Allergies as of 04/29/2022       Reactions   Levaquin [levofloxacin] Other (See Comments)   Joint pain.. Per doctor not to take again   Oxycodone Nausea Only   Can take oxycodone with Zofran Other reaction(s): vomiting   Sulfonamide Derivatives Other (See Comments)   Possibly caused hepatitis   Sulfa Antibiotics Other (See Comments)   Possibly caused hepatitis   Morphine Nausea Only        Medication List     STOP taking these medications    aspirin 81 MG chewable tablet   Gemtesa 75 MG Tabs Generic drug: Vibegron   lisinopril 20 MG tablet Commonly known as: ZESTRIL   metoprolol tartrate 25 MG tablet Commonly known as: LOPRESSOR   nitroGLYCERIN 0.4 MG SL tablet Commonly known as: NITROSTAT   oxybutynin 5 MG tablet Commonly known as: DITROPAN   predniSONE 10 MG tablet Commonly known as: DELTASONE   Ventolin HFA 108 (90 Base) MCG/ACT inhaler Generic drug: albuterol       TAKE these medications    acetaminophen 500 MG tablet Commonly known as: TYLENOL Take 1,000 mg by mouth every 6 (six) hours as needed for mild pain.  amiodarone 100 MG tablet Commonly known as: PACERONE Take 1 tablet (100 mg total) by mouth daily. What changed:  medication strength how much to take   Breztri Aerosphere 160-9-4.8 MCG/ACT Aero Generic drug: Budeson-Glycopyrrol-Formoterol Inhale 2 puffs into the lungs in the morning and at bedtime.   Eliquis 2.5 MG Tabs tablet Generic drug: apixaban TAKE 1 TABLET(2.5 MG) BY MOUTH TWICE DAILY What changed: See the new instructions.   guaiFENesin 600 MG 12 hr tablet Commonly known as:  MUCINEX Take 2 tablets (1,200 mg total) by mouth 2 (two) times daily.   ipratropium-albuterol 0.5-2.5 (3) MG/3ML Soln Commonly known as: DUONEB Take 3 mLs by nebulization every 6 (six) hours as needed (wheezing; shortness of breath).   omeprazole 40 MG capsule Commonly known as: PRILOSEC Take 40 mg by mouth in the morning and at bedtime.   polyethylene glycol 17 g packet Commonly known as: MIRALAX / GLYCOLAX Take 17 g by mouth daily.   sertraline 50 MG tablet Commonly known as: ZOLOFT Take 1 tablet (50 mg total) by mouth at bedtime. What changed: Another medication with the same name was removed. Continue taking this medication, and follow the directions you see here.   TUMS ULTRA 1000 PO Take 2,000-3,000 mg by mouth at bedtime as needed (acid reflux/indigestion/heartburn).               Discharge Care Instructions  (From admission, onward)           Start     Ordered   04/29/22 0000  Discharge wound care:       Comments: Foam dressing to sacrum, change Q 3 days or PRN soiling   04/29/22 0940            Follow-up Information     Elgie Collard, PA-C Follow up on 05/20/2022.   Specialty: Cardiology Why: @ 8:50AM. [facility] cardiology follow up Contact information: [place] [address] [phone]         Robley Fries, MD. Schedule an appointment as soon as possible for a visit in 1 week(s).   Specialty: Urology Contact information: [address] [place] [address] [phone]                 Discharge Instructions     Discharge wound care:   Complete by: As directed    Foam dressing to sacrum, change Q 3 days or PRN soiling   Increase activity slowly   Complete by: As directed        Discharge Exam: Filed Weights   04/18/22 1605  Weight: 58 kg    General: Pt is alert, awake, not in acute distress, oriented, interactive Cardiovascular: RRR, nl S1-S2, no murmurs appreciated.    No LE edema.   Respiratory: Normal respiratory rate and rhythm.  CTAB without rales or wheezes. Abdominal: Abdomen soft and non-tender.  No distension or HSM.   Neuro/Psych: Strength symmetric in upper and lower extremities.  Judgment and insight appear normal.  Generally weak but symmetric, speech fluent.   Condition at discharge: fair  The results of significant diagnostics from this hospitalization (including imaging, microbiology, ancillary and laboratory) are listed below for reference.   Imaging Studies: DG Chest Port 1 View  Result Date: 04/28/2022 CLINICAL DATA:  Cough, shortness of breath EXAM: PORTABLE CHEST 1 VIEW COMPARISON:  04/18/2022 FINDINGS: Heart size within normal limits. Aortic atherosclerosis. Small right and probable trace left pleural effusions. Small patchy left  upper lobe opacity. No pneumothorax. Bones are demineralized. Suspected new fractures involving the left lower ribs laterally. IMPRESSION: 1. Small patchy left upper lobe opacity, suspicious for pneumonia. 2. Small right and probable trace left pleural effusions. 3. Suspected new fractures involving the left lower ribs laterally. Correlate with point tenderness and consider dedicated radiographic rib series. Electronically Signed   By: Davina Poke D.O.   On: 04/28/2022 13:11   DG ESOPHAGUS W SINGLE CM (SOL OR THIN BA)  Result Date: 04/19/2022 CLINICAL DATA:  88 year old female failure to thrive. Team is requesting an esophagram for further evaluation EXAM: ESOPHAGUS/BARIUM SWALLOW/TABLET STUDY TECHNIQUE: Single contrast examination was performed using thin liquid barium. This exam was performed by Rushie Nyhan, and was supervised and interpreted by Zetta Bills, MD. FLUOROSCOPY: Radiation Exposure Index (as provided by the fluoroscopic device): 12.8 mGy Kerma COMPARISON:  CT of the chest favor 08/11/2022 and CT of the abdomen and pelvis performed also in April 18, 2022 FINDINGS: Swallowing: Appears  normal. No vestibular penetration or aspiration seen. Pharynx: Unremarkable. Esophagus: Dilated esophagus, widely patent into the intrathoracic portion the stomach. Esophageal motility: Intact primary wave but with incomplete stripping and substantial stasis in the esophagus also reflux from the intrathoracic stomach into the esophagus with abundant stasis in the lumen of the esophagus. 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 Thickened folds in the intrathoracic stomach suspected, not well evaluated on this single contrast upper gastrointestinal imaging study. Note that there is limited passage of administered oral contrast media which is somewhat positional from the intrathoracic into the subdiaphragmatic stomach. Stomach is not distended on the current study below the diaphragm and is not well assessed. Gastroesophageal reflux: Spontaneous severe gastroesophageal reflux from the intrathoracic stomach into the esophagus. Ingested 39m barium tablet: Not given Other: None. IMPRESSION: Some esophageal dysmotility with abundant stasis of a dilation of the esophagus in the setting of a mixed type hernia which appears to limited passage of contrast from the intrathoracic portion of the stomach into the abdomen. Stomach not well assessed, correlate with any history of pain or vomiting. Widely patent gastroesophageal junction with signs of spontaneous reflux from the intrathoracic stomach into the esophagus. Fold thickening throughout the intrathoracic stomach in this patient with mixed type hiatal hernia should be correlated with symptoms. Potentially related to gastritis but not well assessed on single contrast imaging. Electronically Signed   By: GZetta BillsM.D.   On: 04/19/2022 16:43   CT Angio Chest PE W and/or Wo Contrast  Result Date: 04/18/2022 CLINICAL DATA:  Left-sided pleural effusion as well as abdominal distension. EXAM: CT  ANGIOGRAPHY CHEST CT ABDOMEN AND PELVIS WITH CONTRAST TECHNIQUE: Multidetector CT imaging of the chest was performed using the standard protocol during bolus administration of intravenous contrast. Multiplanar CT image reconstructions and MIPs were obtained to evaluate the vascular anatomy. Multidetector CT imaging of the abdomen and pelvis was performed using the standard protocol during bolus administration of intravenous contrast. RADIATION DOSE REDUCTION: This exam was performed according to the departmental dose-optimization program which includes automated exposure control, adjustment of the mA and/or kV according to patient size and/or use of iterative reconstruction technique. CONTRAST:  862mOMNIPAQUE IOHEXOL 350 MG/ML SOLN COMPARISON:  Chest CT dated 08/10/2021. CT abdomen pelvis dated 07/29/2019. FINDINGS: Evaluation is limited due to respiratory motion as well as due to streak artifact caused by patient's arms and left shoulder arthroplasty. CTA CHEST FINDINGS Cardiovascular: There is no cardiomegaly no pericardial  effusion. Mild atherosclerotic calcification of the thoracic aorta. No aneurysmal dilatation or dissection. Evaluation of the pulmonary arteries is limited due to factors above. No pulmonary artery embolus identified. Mediastinum/Nodes: No hilar or mediastinal adenopathy. Small calcified granuloma. There is a small hiatal hernia. Mild thickened appearance of the distal esophagus may represent esophagitis. Clinical correlation is recommended. Ingested content noted within the esophagus which may represent delayed clearance for reflux. Underlying distal esophageal stricture or mass is not excluded. Endoscopy may provide better evaluation if clinically indicated. Lungs/Pleura: Small bilateral pleural effusions, left greater than right and increased since the prior CT. There is partial compressive atelectasis of the lower lobes. Pneumonia is less likely but not excluded. Right lung base linear  atelectasis/scarring. There is no pneumothorax. There is a 3 mm right middle lobe nodule or granuloma. The central airways are patent. Musculoskeletal: Total left shoulder arthroplasty. Osteopenia with degenerative changes of the spine. No acute osseous pathology. Review of the MIP images confirms the above findings. CT ABDOMEN and PELVIS FINDINGS No intra-abdominal free air or free fluid. Hepatobiliary: Several small liver cysts and additional subcentimeter hypodense lesions which are too small to characterize. No biliary ductal dilatation. The gallbladder is unremarkable Pancreas: Unremarkable. No pancreatic ductal dilatation or surrounding inflammatory changes. Spleen: Normal in size without focal abnormality. Adrenals/Urinary Tract: Mild thickened appearance of the medial limb of the right adrenal gland as well as an indeterminate subcentimeter left adrenal nodule similar to 2021, likely a benign adenoma. Mild bilateral renal parenchyma atrophy. There is a 3 mm nonobstructing left renal inferior pole calculus. Small bilateral upper pole cysts. Several additional subcentimeter hypodense lesions are too small to characterize. There is no hydronephrosis on either side. There is symmetric enhancement and excretion of contrast by both kidneys. The visualized ureters appear unremarkable. The urinary bladder is distended and grossly unremarkable. Stomach/Bowel: Small hiatal hernia. There is moderate stool throughout the colon. Several small scattered sigmoid diverticula without active inflammatory changes. There is no bowel obstruction or active inflammation. The appendix is not visualized with certainty. No inflammatory changes identified in the right lower quadrant. Vascular/Lymphatic: Moderate aortoiliac atherosclerotic disease. The IVC is unremarkable. No portal venous gas. There is no adenopathy. Reproductive: The uterus is poorly visualized. A 2.1 cm left ovarian cyst. No follow-up imaging recommended. Note: This  recommendation does not apply to premenarchal patients and to those with increased risk (genetic, family history, elevated tumor markers or other high-risk factors) of ovarian cancer. Reference: JACR 2020 Feb; 17(2):248-254 Other: None Musculoskeletal: There is a total right hip arthroplasty and prior ORIF of left femoral neck fracture. Old healed bilateral pubic rami fractures. Osteopenia with degenerative changes of the spine. No acute osseous pathology. Review of the MIP images confirms the above findings. IMPRESSION: 1. No CT evidence of pulmonary artery embolus. 2. Small bilateral pleural effusions, left greater than right and increased since the prior CT. No acute intra-abdominal or pelvic pathology. 3. A 3 mm nonobstructing left renal inferior pole calculus. No hydronephrosis. 4. Colonic diverticulosis. No bowel obstruction. 5. Small hiatal hernia. Ingested content within the esophagus may represent delayed clearance for reflux. 6.  Aortic Atherosclerosis (ICD10-I70.0). Electronically Signed   By: Anner Crete M.D.   On: 04/18/2022 19:48   CT ABDOMEN PELVIS W CONTRAST  Result Date: 04/18/2022 CLINICAL DATA:  Left-sided pleural effusion as well as abdominal distension. EXAM: CT ANGIOGRAPHY CHEST CT ABDOMEN AND PELVIS WITH CONTRAST TECHNIQUE: Multidetector CT imaging of the chest was performed using the standard protocol during bolus administration  of intravenous contrast. Multiplanar CT image reconstructions and MIPs were obtained to evaluate the vascular anatomy. Multidetector CT imaging of the abdomen and pelvis was performed using the standard protocol during bolus administration of intravenous contrast. RADIATION DOSE REDUCTION: This exam was performed according to the departmental dose-optimization program which includes automated exposure control, adjustment of the mA and/or kV according to patient size and/or use of iterative reconstruction technique. CONTRAST:  4m OMNIPAQUE IOHEXOL 350 MG/ML  SOLN COMPARISON:  Chest CT dated 08/10/2021. CT abdomen pelvis dated 07/29/2019. FINDINGS: Evaluation is limited due to respiratory motion as well as due to streak artifact caused by patient's arms and left shoulder arthroplasty. CTA CHEST FINDINGS Cardiovascular: There is no cardiomegaly no pericardial effusion. Mild atherosclerotic calcification of the thoracic aorta. No aneurysmal dilatation or dissection. Evaluation of the pulmonary arteries is limited due to factors above. No pulmonary artery embolus identified. Mediastinum/Nodes: No hilar or mediastinal adenopathy. Small calcified granuloma. There is a small hiatal hernia. Mild thickened appearance of the distal esophagus may represent esophagitis. Clinical correlation is recommended. Ingested content noted within the esophagus which may represent delayed clearance for reflux. Underlying distal esophageal stricture or mass is not excluded. Endoscopy may provide better evaluation if clinically indicated. Lungs/Pleura: Small bilateral pleural effusions, left greater than right and increased since the prior CT. There is partial compressive atelectasis of the lower lobes. Pneumonia is less likely but not excluded. Right lung base linear atelectasis/scarring. There is no pneumothorax. There is a 3 mm right middle lobe nodule or granuloma. The central airways are patent. Musculoskeletal: Total left shoulder arthroplasty. Osteopenia with degenerative changes of the spine. No acute osseous pathology. Review of the MIP images confirms the above findings. CT ABDOMEN and PELVIS FINDINGS No intra-abdominal free air or free fluid. Hepatobiliary: Several small liver cysts and additional subcentimeter hypodense lesions which are too small to characterize. No biliary ductal dilatation. The gallbladder is unremarkable Pancreas: Unremarkable. No pancreatic ductal dilatation or surrounding inflammatory changes. Spleen: Normal in size without focal abnormality. Adrenals/Urinary  Tract: Mild thickened appearance of the medial limb of the right adrenal gland as well as an indeterminate subcentimeter left adrenal nodule similar to 2021, likely a benign adenoma. Mild bilateral renal parenchyma atrophy. There is a 3 mm nonobstructing left renal inferior pole calculus. Small bilateral upper pole cysts. Several additional subcentimeter hypodense lesions are too small to characterize. There is no hydronephrosis on either side. There is symmetric enhancement and excretion of contrast by both kidneys. The visualized ureters appear unremarkable. The urinary bladder is distended and grossly unremarkable. Stomach/Bowel: Small hiatal hernia. There is moderate stool throughout the colon. Several small scattered sigmoid diverticula without active inflammatory changes. There is no bowel obstruction or active inflammation. The appendix is not visualized with certainty. No inflammatory changes identified in the right lower quadrant. Vascular/Lymphatic: Moderate aortoiliac atherosclerotic disease. The IVC is unremarkable. No portal venous gas. There is no adenopathy. Reproductive: The uterus is poorly visualized. A 2.1 cm left ovarian cyst. No follow-up imaging recommended. Note: This recommendation does not apply to premenarchal patients and to those with increased risk (genetic, family history, elevated tumor markers or other high-risk factors) of ovarian cancer. Reference: JACR 2020 Feb; 17(2):248-254 Other: None Musculoskeletal: There is a total right hip arthroplasty and prior ORIF of left femoral neck fracture. Old healed bilateral pubic rami fractures. Osteopenia with degenerative changes of the spine. No acute osseous pathology. Review of the MIP images confirms the above findings. IMPRESSION: 1. No CT evidence of pulmonary artery  embolus. 2. Small bilateral pleural effusions, left greater than right and increased since the prior CT. No acute intra-abdominal or pelvic pathology. 3. A 3 mm  nonobstructing left renal inferior pole calculus. No hydronephrosis. 4. Colonic diverticulosis. No bowel obstruction. 5. Small hiatal hernia. Ingested content within the esophagus may represent delayed clearance for reflux. 6.  Aortic Atherosclerosis (ICD10-I70.0). Electronically Signed   By: Anner Crete M.D.   On: 04/18/2022 19:48   CT Cervical Spine Wo Contrast  Result Date: 04/18/2022 CLINICAL DATA:  [place], weakness EXAM: CT CERVICAL SPINE WITHOUT CONTRAST TECHNIQUE: Multidetector CT imaging of the cervical spine was performed without intravenous contrast. Multiplanar CT image reconstructions were also generated. RADIATION DOSE REDUCTION: This exam was performed according to the departmental dose-optimization program which includes automated exposure control, adjustment of the mA and/or kV according to patient size and/or use of iterative reconstruction technique. COMPARISON:  None Available. FINDINGS: Alignment: There is mild degenerative retrolisthesis of C3 on C4 and C6 on C7. Otherwise alignment is grossly anatomic. 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 There are no acute or destructive bony abnormalities. Soft tissues and spinal canal: No prevertebral fluid or swelling. No visible canal hematoma. Disc levels: There is severe multilevel spondylosis and facet hypertrophy, greatest at C3-4, C4-5, and C6-7. Marked hypertrophic changes are seen at the C1-C2 interface. Upper chest: Airway is patent. Emphysematous changes are seen at the lung apices. Mildly distended and fluid-filled proximal thoracic esophagus incidentally noted. Other: Reconstructed images demonstrate no additional findings. IMPRESSION: 1. No acute cervical spine fracture. 2. Extensive multilevel degenerative changes. Electronically Signed   By: Randa Ngo M.D.   On: 04/18/2022 19:37   CT Head Wo Contrast  Result Date: 04/18/2022 CLINICAL DATA:  [place], weakness EXAM:  CT HEAD WITHOUT CONTRAST TECHNIQUE: Contiguous axial images were obtained from the base of the skull through the vertex without intravenous contrast. RADIATION DOSE REDUCTION: This exam was performed according to the departmental dose-optimization program which includes automated exposure control, adjustment of the mA and/or kV according to patient size and/or use of iterative reconstruction technique. COMPARISON:  07/18/2019 FINDINGS: Brain: Chronic small vessel ischemic changes are seen throughout the periventricular and subcortical white matter, grossly stable. No evidence of acute infarct or hemorrhage. Lateral ventricles and midline structures are otherwise unremarkable. No acute extra-axial fluid collections. No mass effect. Vascular: No hyperdense vessel or unexpected calcification. Skull: Normal. Negative for fracture or focal lesion. Sinuses/Orbits: Retained secretions within the sphenoid and right maxillary sinuses, with superimposed gas fluid levels. Remaining paranasal sinuses are clear. Other: None. IMPRESSION: 1. No acute intracranial process. 2. Right maxillary and sphenoid sinus disease. Electronically Signed   By: Randa Ngo M.D.   On: 04/18/2022 19:35   DG Chest 2 View  Result Date: 04/18/2022 CLINICAL DATA:  Short of breath and fatigue EXAM: CHEST - 2 VIEW COMPARISON:  Radiograph 03/14/2022 FINDINGS: Normal cardiac silhouette small LEFT effusion noted. Hyperinflated lungs. no focal consolidation. No pneumothorax. Degenerative change of the RIGHT shoulder. Arthroplasty LEFT shoulder. Healed rib fractures on the LEFT. IMPRESSION: 1. New small to moderate LEFT pleural effusion. 2. Hyperinflated lungs without evidence of pneumonia. Electronically Signed   By: Suzy Bouchard M.D.   On: 04/18/2022 16:49    Microbiology: Results for orders placed or performed during the hospital encounter of 04/18/22  Resp panel by RT-PCR (RSV, Flu A&B, Covid) Anterior Nasal Swab     Status: None    Collection Time:  04/18/22  4:33 PM   Specimen: Anterior Nasal Swab  Result Value Ref Range Status   SARS Coronavirus 2 by RT PCR NEGATIVE NEGATIVE Final    Comment: (NOTE) SARS-CoV-2 target nucleic acids are NOT DETECTED.  The SARS-CoV-2 RNA is generally detectable in upper respiratory specimens during the acute phase of infection. The lowest concentration of SARS-CoV-2 viral copies this assay can detect is 138 copies/mL. A negative result does not preclude SARS-Cov-2 infection and should not be used as the sole basis for treatment or other patient management decisions. A negative result may occur with  improper specimen collection/handling, submission of specimen other than nasopharyngeal swab, presence of viral mutation(s) within the areas targeted by this assay, and inadequate number of viral copies(<138 copies/mL). A negative result must be combined with clinical observations, patient history, and epidemiological information. The expected result is Negative.  Fact Sheet for Patients:  EntrepreneurPulse.com.au  Fact Sheet for Healthcare Providers:  IncredibleEmployment.be  This test is no t yet approved or cleared by the [place] FDA and  has been authorized for detection and/or diagnosis of SARS-CoV-2 by FDA under an Emergency Use Authorization (EUA). This EUA will remain  in effect (meaning this test can be used) for the duration of the COVID-19 declaration under Section 564(b)(1) of the Act, 21 U.S.C.section 360bbb-3(b)(1), unless the authorization is terminated  or revoked sooner.       Influenza A by PCR NEGATIVE NEGATIVE Final   Influenza B by PCR NEGATIVE NEGATIVE Final    Comment: (NOTE) The Xpert Xpress SARS-CoV-2/FLU/RSV plus assay is intended as an aid in the diagnosis of influenza from Nasopharyngeal swab specimens and should not be used as a sole basis for treatment. Nasal washings and aspirates are unacceptable for  Xpert Xpress SARS-CoV-2/FLU/RSV testing.  Fact Sheet for Patients: EntrepreneurPulse.com.au  Fact Sheet for Healthcare Providers: IncredibleEmployment.be  This test is not yet approved or cleared by the [place] FDA and has been authorized for detection and/or diagnosis of SARS-CoV-2 by FDA under an Emergency Use Authorization (EUA). This EUA will remain in effect (meaning this test can be used) for the duration of the COVID-19 declaration under Section 564(b)(1) of the Act, 21 U.S.C. section 360bbb-3(b)(1), unless the authorization is terminated or revoked.     Resp Syncytial Virus by PCR NEGATIVE NEGATIVE Final    Comment: (NOTE) Fact Sheet for Patients: EntrepreneurPulse.com.au  Fact Sheet for Healthcare Providers: IncredibleEmployment.be  This test is not yet approved or cleared by the [place] FDA and has been authorized for detection and/or diagnosis of SARS-CoV-2 by FDA under an Emergency Use Authorization (EUA). This EUA will remain in effect (meaning this test can be used) for the duration of the COVID-19 declaration under Section 564(b)(1) of the Act, 21 U.S.C. section 360bbb-3(b)(1), unless the authorization is terminated or revoked.  Performed at [facility], [place] [address], [place], [place] [address]   MRSA Next Gen by PCR, Nasal     Status: None   Collection Time: 04/19/22  7:01 PM   Specimen: Nasal Mucosa; Nasal Swab  Result Value Ref Range Status   MRSA by PCR Next Gen NOT DETECTED NOT DETECTED Final    Comment: (NOTE) The GeneXpert MRSA Assay (FDA approved for NASAL specimens only), is one component of a comprehensive MRSA colonization surveillance program. It is not intended to diagnose MRSA infection nor to guide or monitor treatment for MRSA infections. Test performance is not FDA approved in patients less than 35 years old. Performed  at [facility], [place] [address], [place], [place] [address]   Resp panel by RT-PCR (RSV, Flu A&B, Covid) Anterior Nasal Swab     Status: None   Collection Time: 04/28/22  1:29 PM   Specimen: Anterior Nasal Swab  Result Value Ref Range Status   SARS Coronavirus 2 by RT PCR NEGATIVE NEGATIVE Final    Comment: (NOTE) SARS-CoV-2 target nucleic acids are NOT DETECTED.  The SARS-CoV-2 RNA is generally detectable in upper respiratory specimens during the acute phase of infection. The lowest concentration of SARS-CoV-2 viral copies this assay can detect is 138 copies/mL. A negative result does not preclude SARS-Cov-2 infection and should not be used as the sole basis for treatment or other patient management decisions. A negative result may occur with  improper specimen collection/handling, submission of specimen other than nasopharyngeal swab, presence of viral mutation(s) within the areas targeted by this assay, and inadequate number of viral copies(<138 copies/mL). A negative result must be combined with clinical observations, patient history, and epidemiological information. The expected result is Negative.  Fact Sheet for Patients:  EntrepreneurPulse.com.au  Fact Sheet for Healthcare Providers:  IncredibleEmployment.be  This test is no t yet approved or cleared by the [place] FDA and  has been authorized for detection and/or diagnosis of SARS-CoV-2 by FDA under an Emergency Use Authorization (EUA). This EUA will remain  in effect (meaning this test can be used) for the duration of the COVID-19 declaration under Section 564(b)(1) of the Act, 21 U.S.C.section 360bbb-3(b)(1), unless the authorization is terminated  or revoked sooner.       Influenza A by PCR NEGATIVE NEGATIVE Final   Influenza B by PCR NEGATIVE NEGATIVE Final    Comment: (NOTE) The Xpert Xpress SARS-CoV-2/FLU/RSV plus assay is intended as an aid in the diagnosis of  influenza from Nasopharyngeal swab specimens and should not be used as a sole basis for treatment. Nasal washings and aspirates are unacceptable for Xpert Xpress SARS-CoV-2/FLU/RSV testing.  Fact Sheet for Patients: EntrepreneurPulse.com.au  Fact Sheet for Healthcare Providers: IncredibleEmployment.be  This test is not yet approved or cleared by the [place] FDA and has been authorized for detection and/or diagnosis of SARS-CoV-2 by FDA under an Emergency Use Authorization (EUA). This EUA will remain in effect (meaning this test can be used) for the duration of the COVID-19 declaration under Section 564(b)(1) of the Act, 21 U.S.C. section 360bbb-3(b)(1), unless the authorization is terminated or revoked.     Resp Syncytial Virus by PCR NEGATIVE NEGATIVE Final    Comment: (NOTE) Fact Sheet for Patients: EntrepreneurPulse.com.au  Fact Sheet for Healthcare Providers: IncredibleEmployment.be  This test is not yet approved or cleared by the [place] FDA and has been authorized for detection and/or diagnosis of SARS-CoV-2 by FDA under an Emergency Use Authorization (EUA). This EUA will remain in effect (meaning this test can be used) for the duration of the COVID-19 declaration under Section 564(b)(1) of the Act, 21 U.S.C. section 360bbb-3(b)(1), unless the authorization is terminated or revoked.  Performed at [facility], [place] [address] [place], [place] [address]   Respiratory (~20 pathogens) panel by PCR     Status: Abnormal   Collection Time: 04/28/22  1:29 PM   Specimen: Nasopharyngeal Swab; Respiratory  Result Value Ref Range Status   Adenovirus NOT DETECTED NOT DETECTED Final   Coronavirus 229E NOT DETECTED NOT DETECTED Final    Comment: (NOTE) The Coronavirus on the Respiratory Panel, DOES NOT test for the  novel  Coronavirus (2019 nCoV)    Coronavirus HKU1 NOT  DETECTED NOT DETECTED Final   Coronavirus NL63 NOT DETECTED NOT DETECTED Final   Coronavirus OC43 NOT DETECTED NOT DETECTED Final   Metapneumovirus NOT DETECTED NOT DETECTED Final   Rhinovirus / Enterovirus DETECTED (A) NOT DETECTED Final   Influenza A NOT DETECTED NOT DETECTED Final   Influenza B NOT DETECTED NOT DETECTED Final   Parainfluenza Virus 1 NOT DETECTED NOT DETECTED Final   Parainfluenza Virus 2 NOT DETECTED NOT DETECTED Final   Parainfluenza Virus 3 NOT DETECTED NOT DETECTED Final   Parainfluenza Virus 4 NOT DETECTED NOT DETECTED Final   Respiratory Syncytial Virus NOT DETECTED NOT DETECTED Final   Bordetella pertussis NOT DETECTED NOT DETECTED Final   Bordetella Parapertussis NOT DETECTED NOT DETECTED Final   Chlamydophila pneumoniae NOT DETECTED NOT DETECTED Final   Mycoplasma pneumoniae NOT DETECTED NOT DETECTED Final    Comment: Performed at [facility], [place] [address] [place], [place] [address]    Labs: CBC: No results for input(s): "WBC", "NEUTROABS", "HGB", "HCT", "MCV", "PLT" in the last 168 hours. Basic Metabolic Panel: Recent Labs  Lab 04/24/22 0345 04/27/22 0410 04/29/22 0422  NA 137 136 134*  K 4.0 3.8 3.6  CL 111 109 103  CO2 22 21* 22  GLUCOSE 112* 86 97  BUN '11 11 15  '$ CREATININE 0.67 0.68 0.68  CALCIUM 8.0* 7.7* 7.9*   Liver Function Tests: No results for input(s): "AST", "ALT", "ALKPHOS", "BILITOT", "PROT", "ALBUMIN" in the last 168 hours. CBG: No results for input(s): "GLUCAP" in the last 168 hours.  Discharge time spent: approximately 35 minutes spent on discharge counseling, evaluation of patient on day of discharge, and coordination of discharge planning with nursing, social work, pharmacy and case management  Signed: Edwin Dada, MD [facility] 04/29/2022

## 2022-04-29 NOTE — TOC Transition Note (Addendum)
Transition of Care [facility]) - CM/SW Discharge Note   Patient Details  Name: [name] MRN: [id number] Date of Birth: [birth year]  [facility] CM/SW Contact:  Henrietta Dine, RN Phone Number: 04/29/2022, 9:36 AM   Clinical Narrative:    D/C orders received; spoke w/ [place]; notified him pt +rhinovirus; she says pt can admit today; RM # [phone], call report # [phone]; he requested pt admit after 1 PM; pt's dtr [name] notified and agreed to d/c plan; SNF transfer report and d/c summary sent via SNF HUB; called [facility] at [phone]; spoke w/ [place] [place], P and arranged 1 PM pickup for pt; no TOC needs.   Final next level of care: Skilled Nursing Facility Barriers to Discharge: No Barriers Identified   Patient Goals and CMS Choice      Discharge Placement                Patient chooses bed at: [facility] Patient to be transferred to facility by: [place] Name of family member notified: [name] (dtr) [phone] Patient and family notified of of transfer: 04/29/22  Discharge Plan and Services Additional resources added to the After Visit Summary for     Discharge Planning Services: CM Consult                                 Social Determinants of Health (SDOH) Interventions SDOH Screenings   Food Insecurity: No Food Insecurity (04/20/2022)  Housing: Low Risk  (04/20/2022)  Transportation Needs: No Transportation Needs (04/20/2022)  Utilities: Not At Risk (04/20/2022)  Tobacco Use: Low Risk  (04/18/2022)     Readmission Risk Interventions    09/26/2019   10:56 AM  Readmission Risk Prevention Plan  Transportation Screening Complete  PCP or Specialist Appt within 3-5 Days Complete  HRI or [place] Complete  Social Work Consult for [place] Planning/Counseling Complete  Palliative Care Screening Not Applicable  Medication Review Press [employer]) Complete

## 2022-05-02 ENCOUNTER — Ambulatory Visit: Payer: Medicare Other | Admitting: Nurse Practitioner

## 2022-05-04 ENCOUNTER — Non-Acute Institutional Stay: Payer: Medicare Other | Admitting: Family Medicine

## 2022-05-04 DIAGNOSIS — I480 Paroxysmal atrial fibrillation: Secondary | ICD-10-CM

## 2022-05-04 DIAGNOSIS — R338 Other retention of urine: Secondary | ICD-10-CM

## 2022-05-04 DIAGNOSIS — E440 Moderate protein-calorie malnutrition: Secondary | ICD-10-CM

## 2022-05-04 DIAGNOSIS — I5032 Chronic diastolic (congestive) heart failure: Secondary | ICD-10-CM

## 2022-05-05 DIAGNOSIS — I480 Paroxysmal atrial fibrillation: Secondary | ICD-10-CM | POA: Diagnosis not present

## 2022-05-05 DIAGNOSIS — J9601 Acute respiratory failure with hypoxia: Secondary | ICD-10-CM | POA: Diagnosis not present

## 2022-05-05 DIAGNOSIS — R339 Retention of urine, unspecified: Secondary | ICD-10-CM | POA: Diagnosis not present

## 2022-05-05 DIAGNOSIS — R627 Adult failure to thrive: Secondary | ICD-10-CM | POA: Diagnosis not present

## 2022-05-06 ENCOUNTER — Other Ambulatory Visit: Payer: Self-pay | Admitting: *Deleted

## 2022-05-06 NOTE — Patient Outreach (Signed)
Mrs. Saker previously admitted to [facility] skilled nursing facility. Previously resided in [place]. Screening for potential [place] care coordination services as benefit of health plan and Primary Care Provider.   Message sent to [facility], [employer] social worker to make aware Probation officer is following for potential [place] care coordination needs.   Will continue to follow.   Marthenia Rolling, MSN, RN,BSN [place] Acute Care Coordinator [phone] (Direct dial)

## 2022-05-09 NOTE — Progress Notes (Unsigned)
[facility] Pall[facility] Consult Note Telephone: [phone]  Fax: [phone]   Date of encounter: 05/04/22 4:15 PM PATIENT NAME: [name] [id number]   [phone] (home)  DOB: 24-Nov-1934 MRN: [id number] PRIMARY CARE PROVIDER:    Garwin Brothers, MD,  [place] [address] [phone]  REFERRING PROVIDER:   Garwin Brothers, MD [address],  [place] [address] [phone]  Health Care Agents:    Contact Information     Name Relation Home Work Mobile   [place] Daughter [phone]  [phone]   Mariah Milling Relative [phone]  [phone]        I met face to face with patient in [facility] facility. Palliative Care was asked to follow this patient by consultation request of Garwin Brothers, MD to address advance care planning and complex medical decision making. This is an initial visit.  ADVANCE CARE PLANNING/GOALS OF CARE: Review of an advance directive document-DNR.  CODE STATUS: DNR  ASSESSMENT AND / RECOMMENDATIONS:  PPS: 50%  Paroxysmal atrial fibrillation Rate controlled on Amiodarone 100 mg daily. Anticoagulated on Apixaban 2.5 mg BID without bleeding.   Chronic diastolic CHF Acute phase resolved. Echo 03/10/22 with EF 60-65% and no significant valvular disease. Continue rate control with Amiodarone Monitor daily weight and if increasing by 2-3 lbs/24 hrs or 5 lbs /5 days, consider addition of lasix and possible beta blocker versus ACE-I/ARB   Acute urinary retention Foley remains in place pending Urology follow up for another trial of voiding.   Malnutrition of moderate degree Last Albumin on 04/18/22 was 2.8, appetite/intake improving. Swallow eval with intact functional swallow and no overt s/sx of dysphagia. Hx of GERD and prominent cricopharyngeus muscle with moderate tertiary contractions which may be what is responsible for recent  aspiration. ST recommended mechanical soft diet with thin liquids and larger pills be split prior to swallow. Likely affected by recent acute, severe viral illnesses. Encourage frequent small intake, house protein supplement between meals.  Follow up Palliative Care Visit:  Palliative Care continuing to follow up by monitoring for changes in appetite, weight, functional and cognitive status for chronic disease progression and management in agreement with patient's stated goals of care. Next visit in 1-2 weeks or prn.  This visit was coded based on medical decision making (MDM).  Chief Complaint  Palliative Care received a referral to follow up with patient following recent admission for COPD exacerbation due to viral pneumonia, atrial fibrillation with RVR and acute on chronic diastolic heart failure.  HISTORY OF PRESENT ILLNESS: [name] is a 88 y.o. year old female with recent hospitalization for acute on chronic diastolic heart failure and atrial fibrillation with RVR.  Patient was initially diagnosed with RSV in February and subsequently developed aspiration pneumonia.  She was treated and d/c'd to SNF for rehabilitation.  Prior to that admission she was living at home alone, independently.  Subsequently during her rehab stay she developed another URI and was found to be saturating in the 70s on room air.  When the ambulance arrived she was noted to also be in atrial fibrillation with a heart rate of 170.  On admission respiratory biofire panel resulted in viral pneumonia with rhinovirus causing COPD exacerbation.  She was treated with Amiodarone, Apixaban and supportive care including O2.  She was noted during the hospitalization to have urinary retention and had a foley placed with immediate drainage of over 1000 cc.  Foley left in place and a trial of void was done prior to d/c with pt being unable to void and having to have it replaced.  She was advised to follow up with urology outpatient.   Once her bladder was decompressed pt's O2 demands immediately decreased and pt rapidly removed from O2 which she has not needed since. She was returned to SNF for rehab. During hospitalization she was noted to be eating very little and sleeping a lot. Pt states goal is for her to return home to independent level of function   ACTIVITIES OF DAILY LIVING: CONTINENT OF BLADDER/BOWEL? No BATHING/DRESSING/FEEDING?  Assistance with everything but feeding  MOBILITY:  AMBULATORY WITH ASSISTIVE DEVICE: rolling walker and assistance  APPETITE? improving  CURRENT PROBLEM LIST:  Patient Active Problem List   Diagnosis Date Noted   Pressure injury of skin 04/27/2022   Hypokalemia 04/27/2022   Pleural effusion on left 04/24/2022   Need for emotional support 04/24/2022   High risk medication use 04/24/2022   Pain 04/24/2022   DNR (do not resuscitate) 04/24/2022   End of life care 04/24/2022   Dyspnea 04/24/2022   Palliative care encounter 04/21/2022   Goals of care, counseling/discussion 04/21/2022   Counseling and coordination of care 04/21/2022   Malnutrition of moderate degree 04/20/2022   Atrial fibrillation with RVR ([place]) 04/18/2022   Hypotension 04/18/2022   Failure to thrive in adult 04/18/2022   Acute urinary retention 04/18/2022   RSV (respiratory syncytial virus pneumonia) 03/11/2022   COPD exacerbation ([place]) 03/09/2022   Paroxysmal atrial fibrillation with RVR ([place]) 03/09/2022   Acute respiratory failure with hypoxia (HCC) 03/09/2022   Bronchiectasis with acute exacerbation ([place]) 03/04/2022   URI (upper respiratory infection) 03/04/2022   Elevated troponin    Anemia 08/08/2021   Bladder spasm 08/08/2021   Chest pain 08/08/2021   Chest pain, rule out acute myocardial infarction 08/07/2021   Chronic diastolic CHF (congestive heart failure) ([place]) 02/03/2021   Cognitive decline 01/18/2021   Right knee pain 06/08/2020   Age-related osteoporosis without current pathological  fracture 05/01/2020   Nasal congestion 05/01/2020   Personal history of COVID-19 05/01/2020   Bilateral hearing loss 11/15/2019   Post-nasal drainage 11/15/2019   COPD (chronic obstructive pulmonary disease) ([facility]) 10/30/2019   Atrial fibrillation ([place])    Lobar pneumonia ([place]) 09/18/2019   Adhesive capsulitis of right shoulder [id number]   Metabolic encephalopathy [id number]   Dysphagia due to suspected esophageal stenosis 07/18/2019   S/P reverse total shoulder arthroplasty, 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   Rotator cuff arthropathy of left shoulder 05/03/2019   Shoulder pain, left 04/29/2019   Low back pain 03/13/2019   Degeneration of lumbar intervertebral disc 02/14/2019   Amnesia 02/06/2018   ETD (Eustachian tube dysfunction), bilateral 11/07/2017   Chronic nonintractable headache 11/07/2017   Otalgia 10/17/2017   Pain in joint of right elbow 05/02/2017   Rib lesion 04/20/2017   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   Atopic dermatitis 04/08/2016   Skin sensation disturbance 03/08/2016   Weakness 12/22/2015   Allergic rhinitis 09/15/2015   CAD (coronary artery disease) 05/18/2015   Essential hypertension 05/18/2015   Carotid artery disease ([place]) 05/18/2015   Cough 03/19/2015   Shortness of breath 12/26/2013   Abnormal gait 11/06/2013   Scoliosis (and kyphoscoliosis), idiopathic 07/30/2013   Acquired unequal leg length on left 04/11/2013   History of fracture 10/18/2012   Palpitations 10/18/2012   Constipation 09/05/2012   History of sinus tachycardia 09/05/2012   Spasm of back muscles  03/12/2012   Lumbar radiculopathy 11/23/2011   Mitral regurgitation 11/22/2011   Low compliance bladder 04/18/2011   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   Carpal tunnel syndrome 02/23/2009   Cardiovascular symptoms 02/23/2009   Vitamin D deficiency 02/23/2009   RECTAL BLEEDING 08/18/2008   PERSONAL HX COLONIC POLYPS 08/18/2008   SKIN CANCER, HX OF 08/14/2008   CARPAL TUNNEL  SYNDROME, HX OF 08/14/2008   SARCOIDOSIS, PULMONARY 08/08/2008   HLD (hyperlipidemia) 08/08/2008   Paroxysmal supraventricular tachycardia (HCC) 08/08/2008   GERD (gastroesophageal reflux disease) 08/08/2008   PAST MEDICAL HISTORY:  Active Ambulatory Problems    Diagnosis Date Noted   SARCOIDOSIS, PULMONARY 08/08/2008   HLD (hyperlipidemia) 08/08/2008   Paroxysmal supraventricular tachycardia (HCC) 08/08/2008   GERD (gastroesophageal reflux disease) 08/08/2008   RECTAL BLEEDING 08/18/2008   SKIN CANCER, HX OF 08/14/2008   CARPAL TUNNEL SYNDROME, HX OF 08/14/2008   PERSONAL HX COLONIC POLYPS 08/18/2008   Mitral regurgitation 11/22/2011   Lumbar radiculopathy 11/23/2011   Constipation 09/05/2012   History of sinus tachycardia 09/05/2012   Acquired unequal leg length on left 04/11/2013   Scoliosis (and kyphoscoliosis), idiopathic 07/30/2013   Abnormal gait 11/06/2013   Shortness of breath 12/26/2013   Cough 03/19/2015   CAD (coronary artery disease) 05/18/2015   Essential hypertension 05/18/2015   Carotid artery disease ([place]) 05/18/2015   Allergic rhinitis 09/15/2015   Rib lesion 04/20/2017   ETD (Eustachian tube dysfunction), bilateral 11/07/2017   Chronic nonintractable headache 11/07/2017   Pain in joint of right elbow 05/02/2017   Low back pain 03/13/2019   Degeneration of lumbar intervertebral disc 02/14/2019   S/P reverse total shoulder arthroplasty, 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   Lobar pneumonia ([place]) 09/18/2019   Atrial fibrillation ([place])    COPD (chronic obstructive pulmonary disease) ([place]) 10/30/2019   Shoulder pain, left 04/29/2019   History of fracture 10/18/2012   Age-related osteoporosis without current pathological fracture 05/01/2020   Adhesive capsulitis of right shoulder 07/29/2019   Amnesia 02/06/2018   Atopic dermatitis 04/08/2016   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   Bilateral hearing loss 11/15/2019   Carpal tunnel syndrome 02/23/2009   Congenital deformity  of musculoskeletal system 08/04/2016   Weakness 12/22/2015   Dysphagia due to suspected esophageal stenosis 07/18/2019   Low compliance bladder [id number]   Metabolic encephalopathy [id number]   Nasal congestion 05/01/2020   Otalgia 10/17/2017   Cardiovascular symptoms 02/23/2009   Palpitations 10/18/2012   Personal history of COVID-19 05/01/2020   Post-nasal drainage 11/15/2019   Rotator cuff arthropathy of left shoulder 05/03/2019   Skin sensation disturbance 03/08/2016   Spasm of back muscles 03/12/2012   Vitamin D deficiency 02/23/2009   Right knee pain 06/08/2020   Cognitive decline 01/18/2021   Chronic diastolic CHF (congestive heart failure) ([place]) 02/03/2021   Chest pain, rule out acute myocardial infarction 08/07/2021   Anemia 08/08/2021   Bladder spasm 08/08/2021   Chest pain 08/08/2021   Elevated troponin    Bronchiectasis with acute exacerbation ([place]) 03/04/2022   URI (upper respiratory infection) 03/04/2022   COPD exacerbation ([place]) 03/09/2022   Paroxysmal atrial fibrillation with RVR ([place]) 03/09/2022   Acute respiratory failure with hypoxia ([place]) 03/09/2022   RSV (respiratory syncytial virus pneumonia) 03/11/2022   Atrial fibrillation with RVR ([place]) 04/18/2022   Hypotension 04/18/2022   Failure to thrive in adult 04/18/2022   Acute urinary retention 04/18/2022   Malnutrition of moderate degree 04/20/2022   Palliative care encounter 04/21/2022   Goals of care, counseling/discussion 04/21/2022  Counseling and coordination of care 04/21/2022   Pleural effusion on left 04/24/2022   Need for emotional support 04/24/2022   High risk medication use 04/24/2022   Pain 04/24/2022   DNR (do not resuscitate) 04/24/2022   End of life care 04/24/2022   Dyspnea 04/24/2022   Pressure injury of skin 04/27/2022   Hypokalemia 04/27/2022   Resolved Ambulatory Problems    Diagnosis Date Noted   DIARRHEA-PRESUMED INFECTIOUS 08/18/2008   DEGENERATIVE JOINT DISEASE 08/14/2008    Leg weakness 11/23/2011   Acute posthemorrhagic anemia 08/22/2012   Closed left subtrochanteric femur fracture S/P Open/closed and reduction, internal medullary fixation  09/05/2012   Left foot pain 09/02/2014   CAD (coronary artery disease), native coronary artery 05/20/2015   Coronary artery disease involving native coronary artery of native heart with angina pectoris ([place])    Bronchiectasis without complication ([facility]) [id number]   Closed fracture of right olecranon process 05/09/2017   Aftercare 06/06/2017   Bronchiectasis with acute exacerbation ([facility]) 04/30/2018   Sacral insufficiency fracture 12/12/2018   Hip fracture ([place]) 12/12/2018   Fracture of inferior pubic ramus ([facility] [place]) 02/28/2019   Pre-op evaluation 06/11/2019   Sepsis due to urinary tract infection ([facility]) 07/18/2019   Pyelonephritis 07/29/2019   Hydronephrosis 07/29/2019   Sepsis ([facility]) 07/30/2019   Edema 10/30/2019   Acute pain of left wrist 07/11/2019   Acute maxillary sinusitis 10/17/2017   Acute renal failure syndrome ([facility]) 01/04/2019   Acute upper respiratory infection 09/11/2019   Disorder of lung 11/23/2012   Heart disease 07/29/2019   Hyperglycemia 05/01/2020   Impacted cerumen 05/01/2020   Iron deficiency anemia 07/29/2019   Sepsis due to Escherichia coli (e. coli) ([place]) 07/29/2019   Ecchymosis 08/08/2021   SVT (supraventricular tachycardia) 08/23/2021   Past Medical History:  Diagnosis Date   Allergy    Arthritis    Asthma    Baker's cyst of knee, right    Carotid stenosis    Dysrhythmia    Elbow fracture 04/2017   Esophageal reflux    Hematochezia    History of echocardiogram    Hx of colonoscopy    Macular pucker, right eye    Osteoporosis    Other chronic pulmonary heart diseases    Pneumonia    Pneumonia 09/18/2019   SOCIAL HX:  Social History   Tobacco Use   Smoking status: Never   Smokeless tobacco: Never  Substance Use Topics   Alcohol use: No   FAMILY HX:  Family History   Problem Relation Age of Onset   Colon cancer Mother 21   Cancer Mother    Lung cancer Father    Heart disease Father    Arrhythmia Father    Cancer Father    Cancer Sister        ovarian   Heart attack Brother    Esophageal cancer Neg Hx    Rectal cancer Neg Hx    Stomach cancer Neg Hx    Stroke Neg Hx      Preferred Pharmacy: ALLERGIES:  Allergies  Allergen Reactions   Levaquin [Levofloxacin] Other (See Comments)    Joint pain.. Per doctor not to take again   Oxycodone Nausea Only    Can take oxycodone with Zofran Other reaction(s): vomiting   Sulfonamide Derivatives Other (See Comments)    Possibly caused hepatitis   Sulfa Antibiotics Other (See Comments)    Possibly caused hepatitis   Morphine Nausea Only     PERTINENT MEDICATIONS:  Outpatient Encounter Medications as  of 05/04/2022  Medication Sig   acetaminophen (TYLENOL) 500 MG tablet Take 1,000 mg by mouth every 6 (six) hours as needed for mild pain.    amiodarone (PACERONE) 100 MG tablet Take 1 tablet (100 mg total) by mouth daily.   Budeson-Glycopyrrol-Formoterol (BREZTRI AEROSPHERE) 160-9-4.8 MCG/ACT AERO Inhale 2 puffs into the lungs in the morning and at bedtime.   Calcium Carbonate Antacid (TUMS ULTRA 1000 PO) Take 2,000-3,000 mg by mouth at bedtime as needed (acid reflux/indigestion/heartburn).   ELIQUIS 2.5 MG TABS tablet TAKE 1 TABLET(2.5 MG) BY MOUTH TWICE DAILY (Patient taking differently: Take 2.5 mg by mouth 2 (two) times daily.)   guaiFENesin (MUCINEX) 600 MG 12 hr tablet Take 2 tablets (1,200 mg total) by mouth 2 (two) times daily.   ipratropium-albuterol (DUONEB) 0.5-2.5 (3) MG/3ML SOLN Take 3 mLs by nebulization every 6 (six) hours as needed (wheezing; shortness of breath).   omeprazole (PRILOSEC) 40 MG capsule Take 40 mg by mouth in the morning and at bedtime.   polyethylene glycol (MIRALAX / GLYCOLAX) 17 g packet Take 17 g by mouth daily.   sertraline (ZOLOFT) 50 MG tablet Take 1 tablet (50 mg  total) by mouth at bedtime.   No facility-administered encounter medications on file as of 05/04/2022.    History obtained from review of EMR, discussion with facility staff/caregiver and/or patient.   CBC    Component Value Date/Time   WBC 9.2 04/22/2022 0432   RBC 3.76 (L) 04/22/2022 0432   HGB 10.6 (L) 04/22/2022 0432   HGB 12.7 07/21/2020 1315   HCT 34.1 (L) 04/22/2022 0432   HCT 38.4 07/21/2020 1315   PLT 219 04/22/2022 0432   PLT 309 07/21/2020 1315   MCV 90.7 04/22/2022 0432   MCV 94 07/21/2020 1315   MCH 28.2 04/22/2022 0432   MCHC 31.1 04/22/2022 0432   RDW 18.3 (H) 04/22/2022 0432   RDW 12.7 07/21/2020 1315   LYMPHSABS 1.6 04/18/2022 1631   MONOABS 0.7 04/18/2022 1631   EOSABS 0.0 04/18/2022 1631   BASOSABS 0.1 04/18/2022 1631    CMP    Latest Ref Rng & Units 04/29/2022    4:22 AM 04/27/2022    4:10 AM 04/24/2022    3:45 AM  CMP  Glucose 70 - 99 mg/dL 97  86  112   BUN 8 - 23 mg/dL 15  11  11    Creatinine 0.44 - 1.00 mg/dL 0.68  0.68  0.67   Sodium 135 - 145 mmol/L 134  136  137   Potassium 3.5 - 5.1 mmol/L 3.6  3.8  4.0   Chloride 98 - 111 mmol/L 103  109  111   CO2 22 - 32 mmol/L 22  21  22    Calcium 8.9 - 10.3 mg/dL 7.9  7.7  8.0     LFTs    Latest Ref Rng & Units 04/18/2022    4:31 PM 03/10/2022    4:00 AM 03/09/2022    1:25 PM  Hepatic Function  Total Protein 6.5 - 8.1 g/dL 5.3  5.1  5.9   Albumin 3.5 - 5.0 g/dL 2.8  2.8  3.3   AST 15 - 41 U/L 19  15  24    ALT 0 - 44 U/L 13  14  15    Alk Phosphatase 38 - 126 U/L 59  64  78   Total Bilirubin 0.3 - 1.2 mg/dL 1.1  0.6  0.5     Urinalysis    Component Value Date/Time  COLORURINE AMBER (A) 04/18/2022 2122   APPEARANCEUR CLEAR 04/18/2022 2122   LABSPEC 1.018 04/18/2022 2122   PHURINE 5.0 04/18/2022 2122   GLUCOSEU NEGATIVE 04/18/2022 2122   HGBUR NEGATIVE 04/18/2022 2122   BILIRUBINUR NEGATIVE 04/18/2022 2122   BILIRUBINUR negative 03/21/2018 1328   KETONESUR 5 (A) 04/18/2022 2122   PROTEINUR  NEGATIVE 04/18/2022 2122   UROBILINOGEN negative (A) 03/21/2018 1328   UROBILINOGEN 0.2 12/08/2008 1204   NITRITE NEGATIVE 04/18/2022 2122   LEUKOCYTESUR NEGATIVE 04/18/2022 2122      BNP (last 3 results) Recent Labs    08/07/21 1900 03/09/22 1325 04/18/22 1631  BNP 387.3* 254.2* 290.4*   04/28/22 Respiratory Panel Negative for SARS-CoV-2, Influenza A & B, RSV,  + for Rhinovirus  04/28/22 CXR: FINDINGS: Heart size within normal limits. Aortic atherosclerosis. Small right and probable trace left pleural effusions. Small patchy left upper lobe opacity. No pneumothorax. Bones are demineralized. Suspected new fractures involving the left lower ribs laterally.   IMPRESSION: 1. Small patchy left upper lobe opacity, suspicious for pneumonia. 2. Small right and probable trace left pleural effusions. 3. Suspected new fractures involving the left lower ribs laterally. Correlate with point tenderness and consider dedicated radiographic rib series.  03/10/22 Echo:  1. Left ventricular EF 60 to 65%. LV with normal function and no regional  wall motion abnormalities. Mild concentric LVH. Grade II diastolic dysfunction.  2. Right ventricular systolic function and size are normal. Mildly elevated pulmonary artery systolic  pressure.   3. Trivial aortic and mitral valve regurgitation.    I reviewed available labs, medications, imaging, studies and related documents from the EMR.  Records reviewed and summarized above.   Physical Exam: GENERAL: NAD LUNGS: CTAB, no increased work of breathing, on room air CARDIAC:  S1S2, IRIR, rate controlled with no MRG, trace bipedal edema, No cyanosis ABD:  Normo-active BS x 4 quads, soft, non-tender EXTREMITIES: Normal ROM, no deformity, strength equal but deconditioned, No muscle atrophy/subcutaneous fat loss GU: Foley with leg bag NEURO:  Noted generalized weakness  PSYCH:  non-anxious affect, A & O x 3  Thank you for the opportunity to  participate in the care of [name]. Please call our main office at [phone] if we can be of additional assistance.    Damaris Hippo FNP-C  [email or web address] [facility]  Phone:  [phone]

## 2022-05-10 ENCOUNTER — Telehealth: Payer: Self-pay | Admitting: Family Medicine

## 2022-05-11 ENCOUNTER — Encounter: Payer: Self-pay | Admitting: Family Medicine

## 2022-05-11 NOTE — Telephone Encounter (Signed)
TCF daughter, [name] to review recent hospitalizations and for recommendations on Hospice vs Palliative Care. [name] states that doctors recommended referral to Hospice and states being told that although her mother had a rally not to expect her to continue to do well but then she was noted to continue to improve some.  Prior to admission pt was living at home alone.  She states pt had been sleeping a lot more, less mobile.  Educated daughter on mechanism of heart failure with physiologic mechanism of fluid overload from atrial fib RVR. She advised of sequence of RSV followed by aspiration pneumonia which was treated and pt was sent to [facility] for rehab.  She states pt was there approximately 1 month when she was noted to have sats in the 70s and when EMS arrived that pt was in Afib RVR with rate of 170s. Pt has noted bilateral pleural effusions which were not significant enough to have thoracentesis.  She had significant urinary retention of over 1 L and hiatal hernia which doctors explained to daughter made her displace her diaphragm and with pleural effusion displaced/compressed her lung tissue increasing her O2 demands.  Once bladder was emptied and foley placed, O2 demands decrease and pt has no longer required supplemental O2.  Daughter is trying to assess if pt will be able to return home to independent level.  Advised criteria on Hospice including PPS score to show decline thought to be permanent and progressive as well as having  a terminal diagnosis.  Advised that frequent hospitalizations/infections speak to disease burden and decline.  Encouraged to allow a little more time to assess progress with rehab, intake, swallowing progress and will have a better idea of whether pt is Hospice appropriate or pt's ability to return home with independent function.  Plan is to meet and re-evaluate pt on Monday next week with daughter present.  Total phone time:  46:39  Sherri Hippo FNP-C

## 2022-05-16 ENCOUNTER — Non-Acute Institutional Stay: Payer: Medicare Other | Admitting: Family Medicine

## 2022-05-16 ENCOUNTER — Encounter: Payer: Self-pay | Admitting: Family Medicine

## 2022-05-16 VITALS — BP 138/60 | HR 63 | Temp 97.2°F

## 2022-05-16 DIAGNOSIS — E440 Moderate protein-calorie malnutrition: Secondary | ICD-10-CM

## 2022-05-16 DIAGNOSIS — I5032 Chronic diastolic (congestive) heart failure: Secondary | ICD-10-CM

## 2022-05-16 DIAGNOSIS — R338 Other retention of urine: Secondary | ICD-10-CM

## 2022-05-16 DIAGNOSIS — R531 Weakness: Secondary | ICD-10-CM

## 2022-05-16 NOTE — Progress Notes (Signed)
[facility] Consult Note Telephone: [phone]  Fax: [phone]   Date of encounter: 05/04/22 4:15 PM PATIENT NAME: [name] [address] [place] [address]   [phone] (home)  DOB: 1934/09/30 MRN: [id number] PRIMARY CARE PROVIDER:    Garwin Brothers, MD,  [place] [address] [phone]  REFERRING PROVIDER:   Garwin Brothers, MD [address],  [place] [address] [phone]  Health Care Agents:    Contact Information     Name Relation Home Work Mobile   [place] Daughter [phone]  [phone]   Mariah Milling Relative [phone]  [phone]        I met face to face with patient in [facility] facility. Palliative Care was asked to follow this patient by consultation request of Garwin Brothers, MD to address advance care planning and complex medical decision making. This is a follow up visit.   PRESENT FOR DISCUSSION: daughter Celine Mans, NP student and Damaris Hippo FNP-C  GOALS: most independent functional level that patient can live at.  BARRIERS: Currently needs supervision and confidence in own abilities to ambulate and transfer.   Our advance care planning conversation included a discussion about:            Discussed criteria for being ALF that pt would need to be able to go from her chair to the bathroom independently.         Discussed normal trajectory of declining function with dementia, and impacts of infection/insults to delirium imposed on dementia.         Discussed that there is no time limits for Palliative but with Hospice would need to have an expected life span of 6 months or less and see patient declining in self care, more bedbound and with declines in weight/eating/drinking.  CODE STATUS: DNR  I spent 25   minutes providing this consultation with more than 50% of the time spent on counseling primary family caregiver,  patient and coordinating communication with staff, chart review and documentation. --------------------------------------------------------------------------------------------------------------------------------------------------------------------------------------------    ASSESSMENT AND / RECOMMENDATIONS:  PPS: 50%   Chronic diastolic CHF  Continue current medication regimen without change  Report any increased SOB   Recommend monitor daily weight and if increasing by 2-3 lbs/24 hrs or 5 lbs /5 days, consider addition of lasix and possible beta blocker versus ACE/ARB  Monitor increased edema    Acute urinary retention  Continue to monitor urine residual weekly and PRN for symptoms of urinary retention to include increased pain.   Report any urinary changes to include odor, frequency, dysuria   Monitor for s/sx of UTI  Continue daily Miralax to encourage normalization of bowel patterns. May consider addition of Senna 8.6mg  po x 2 tabs QD   Encourage adequate fluid intake     Malnutrition of moderate degree  Continue to encourage protein rich meals   Recommend beginning mighty shakes BID  Recommend offering snacks and accessibility of finger foods  If no change in patients nutritional status, will consider introduction of Remeron for appetite stimulant.   4.    Weakness             Continue working with PT/OT towards strengthening and increased functional independence.                Encouraged pt to do independently as much as possible with supervision before asking for help and pushing endurance.  Recommended slow position change to allow equillibration before moving.    Follow up Palliative Care Visit:  Palliative Care continuing to follow up by monitoring for changes in appetite, weight, functional and cognitive status for chronic disease progression and management in agreement with patient's stated goals of care. Next visit in 3-4weeks or prn.  This visit was  coded based on medical decision making (MDM).  Chief Complaint  Palliative Care follow up with patient for recent COPD exacerbation ,acute on chronic diastolic heart failure, and urinary retention.   HISTORY OF PRESENT ILLNESS: [name] is a 88 y.o. year old female with hx of urinary retention, COPD, Diastolic CHF. Patient seen in urology as of last week and foley cath removed. Patient reports being able to void without difficulty. Volunteers some constipation. Staff at facility is completing bladder scans routinely to monitor for residual that is routinely approx 30cc. Daughter at bedside and is with patient frequently.  Daughter does comment on increased edema to BLE and R side of face. No increased oxygen demands but patient does experience fatigue. Patient actively participating with PT/OT with continued goal of returning home.   PT/OT state that patient is not giving herself enough credit regarding her abilities and is not pushing herself possibly as hard as she could due to fear.  Patient and family do continue to report decreased appetite. Staff/Daughter state that patient was dx with UTI last Sunday, course of antibiotics completed now.    ACTIVITIES OF DAILY LIVING: CONTINENT OF BLADDER/BOWEL? Yes with occasional incontinence episodes  BATHING/DRESSING/FEEDING?  Minimal - Moderate Assistance with everything but feeding  MOBILITY:  AMBULATORY WITH ASSISTIVE DEVICE: rolling walker/wheelchair - able to ambulate short distances with assistance and supervision   APPETITE?  Improving but still diminished  CURRENT PROBLEM LIST:  Patient Active Problem List   Diagnosis Date Noted   Pressure injury of skin 04/27/2022   Hypokalemia 04/27/2022   Pleural effusion on left 04/24/2022   Need for emotional support 04/24/2022   High risk medication use 04/24/2022   Pain 04/24/2022   DNR (do not resuscitate) 04/24/2022   End of life care 04/24/2022   Dyspnea 04/24/2022   Palliative care  encounter 04/21/2022   Goals of care, counseling/discussion 04/21/2022   Counseling and coordination of care 04/21/2022   Malnutrition of moderate degree 04/20/2022   Atrial fibrillation with RVR ([place]) 04/18/2022   Hypotension 04/18/2022   Failure to thrive in adult 04/18/2022   Acute urinary retention 04/18/2022   RSV (respiratory syncytial virus pneumonia) 03/11/2022   COPD exacerbation ([place]) 03/09/2022   Paroxysmal atrial fibrillation with RVR ([place]) 03/09/2022   Acute respiratory failure with hypoxia ([place]) 03/09/2022   Bronchiectasis with acute exacerbation ([place]) 03/04/2022   URI (upper respiratory infection) 03/04/2022   Elevated troponin    Anemia 08/08/2021   Bladder spasm 08/08/2021   Chest pain 08/08/2021   Chest pain, rule out acute myocardial infarction 08/07/2021   Chronic diastolic CHF (congestive heart failure) ([place]) 02/03/2021   Cognitive decline 01/18/2021   Right knee pain 06/08/2020   Age-related osteoporosis without current pathological fracture 05/01/2020   Nasal congestion 05/01/2020   Personal history of COVID-19 05/01/2020   Bilateral hearing loss 11/15/2019   Post-nasal drainage 11/15/2019   COPD (chronic obstructive pulmonary disease) ([place]) 10/30/2019   Atrial fibrillation (HCC)    Lobar pneumonia ([place]) 09/18/2019   Adhesive capsulitis of right shoulder [id number]   Metabolic encephalopathy [id number]   Dysphagia due to suspected esophageal stenosis 07/18/2019   S/P  reverse total shoulder arthroplasty, 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   Rotator cuff arthropathy of left shoulder 05/03/2019   Shoulder pain, left 04/29/2019   Low back pain 03/13/2019   Degeneration of lumbar intervertebral disc 02/14/2019   Amnesia 02/06/2018   ETD (Eustachian tube dysfunction), bilateral 11/07/2017   Chronic nonintractable headache 11/07/2017   Otalgia 10/17/2017   Pain in joint of right elbow 05/02/2017   Rib lesion 04/20/2017   Congenital deformity of musculoskeletal system  08/04/2016   Atopic dermatitis 04/08/2016   Skin sensation disturbance 03/08/2016   Weakness 12/22/2015   Allergic rhinitis 09/15/2015   CAD (coronary artery disease) 05/18/2015   Essential hypertension 05/18/2015   Carotid artery disease ([place]) 05/18/2015   Cough 03/19/2015   Shortness of breath 12/26/2013   Abnormal gait 11/06/2013   Scoliosis (and kyphoscoliosis), idiopathic 07/30/2013   Acquired unequal leg length on left 04/11/2013   History of fracture 10/18/2012   Palpitations 10/18/2012   Constipation 09/05/2012   History of sinus tachycardia 09/05/2012   Spasm of back muscles 03/12/2012   Lumbar radiculopathy 11/23/2011   Mitral regurgitation 11/22/2011   Low compliance bladder 04/18/2011   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   Carpal tunnel syndrome 02/23/2009   Cardiovascular symptoms 02/23/2009   Vitamin D deficiency 02/23/2009   RECTAL BLEEDING 08/18/2008   PERSONAL HX COLONIC POLYPS 08/18/2008   SKIN CANCER, HX OF 08/14/2008   CARPAL TUNNEL SYNDROME, HX OF 08/14/2008   SARCOIDOSIS, PULMONARY 08/08/2008   HLD (hyperlipidemia) 08/08/2008   Paroxysmal supraventricular tachycardia ([place]) 08/08/2008   GERD (gastroesophageal reflux disease) 08/08/2008   PAST MEDICAL HISTORY:  Active Ambulatory Problems    Diagnosis Date Noted   SARCOIDOSIS, PULMONARY 08/08/2008   HLD (hyperlipidemia) 08/08/2008   Paroxysmal supraventricular tachycardia ([place]) 08/08/2008   GERD (gastroesophageal reflux disease) 08/08/2008   RECTAL BLEEDING 08/18/2008   SKIN CANCER, HX OF 08/14/2008   CARPAL TUNNEL SYNDROME, HX OF 08/14/2008   PERSONAL HX COLONIC POLYPS 08/18/2008   Mitral regurgitation 11/22/2011   Lumbar radiculopathy 11/23/2011   Constipation 09/05/2012   History of sinus tachycardia 09/05/2012   Acquired unequal leg length on left 04/11/2013   Scoliosis (and kyphoscoliosis), idiopathic 07/30/2013   Abnormal gait 11/06/2013   Shortness of breath 12/26/2013   Cough  03/19/2015   CAD (coronary artery disease) 05/18/2015   Essential hypertension 05/18/2015   Carotid artery disease ([place]) 05/18/2015   Allergic rhinitis 09/15/2015   Rib lesion 04/20/2017   ETD (Eustachian tube dysfunction), bilateral 11/07/2017   Chronic nonintractable headache 11/07/2017   Pain in joint of right elbow 05/02/2017   Low back pain 03/13/2019   Degeneration of lumbar intervertebral disc 02/14/2019   S/P reverse total shoulder arthroplasty, 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   Lobar pneumonia ([place]) 09/18/2019   Atrial fibrillation ([place])    COPD (chronic obstructive pulmonary disease) ([place]) 10/30/2019   Shoulder pain, left 04/29/2019   History of fracture 10/18/2012   Age-related osteoporosis without current pathological fracture 05/01/2020   Adhesive capsulitis of right shoulder 07/29/2019   Amnesia 02/06/2018   Atopic dermatitis 04/08/2016   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   Bilateral hearing loss 11/15/2019   Carpal tunnel syndrome 02/23/2009   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   Weakness 12/22/2015   Dysphagia due to suspected esophageal stenosis 07/18/2019   Low compliance bladder [id number]   Metabolic encephalopathy [id number]   Nasal congestion 05/01/2020   Otalgia 10/17/2017   Cardiovascular symptoms 02/23/2009   Palpitations 10/18/2012   Personal history of COVID-19 05/01/2020  Post-nasal drainage 11/15/2019   Rotator cuff arthropathy of left shoulder 05/03/2019   Skin sensation disturbance 03/08/2016   Spasm of back muscles 03/12/2012   Vitamin D deficiency 02/23/2009   Right knee pain 06/08/2020   Cognitive decline 01/18/2021   Chronic diastolic CHF (congestive heart failure) ([place]) 02/03/2021   Chest pain, rule out acute myocardial infarction 08/07/2021   Anemia 08/08/2021   Bladder spasm 08/08/2021   Chest pain 08/08/2021   Elevated troponin    Bronchiectasis with acute exacerbation ([place]) 03/04/2022   URI (upper respiratory  infection) 03/04/2022   COPD exacerbation ([place]) 03/09/2022   Paroxysmal atrial fibrillation with RVR ([place]) 03/09/2022   Acute respiratory failure with hypoxia ([place]) 03/09/2022   RSV (respiratory syncytial virus pneumonia) 03/11/2022   Atrial fibrillation with RVR ([place]) 04/18/2022   Hypotension 04/18/2022   Failure to thrive in adult 04/18/2022   Acute urinary retention 04/18/2022   Malnutrition of moderate degree 04/20/2022   Palliative care encounter 04/21/2022   Goals of care, counseling/discussion 04/21/2022   Counseling and coordination of care 04/21/2022   Pleural effusion on left 04/24/2022   Need for emotional support 04/24/2022   High risk medication use 04/24/2022   Pain 04/24/2022   DNR (do not resuscitate) 04/24/2022   End of life care 04/24/2022   Dyspnea 04/24/2022   Pressure injury of skin 04/27/2022   Hypokalemia 04/27/2022   Resolved Ambulatory Problems    Diagnosis Date Noted   DIARRHEA-PRESUMED INFECTIOUS 08/18/2008   DEGENERATIVE JOINT DISEASE 08/14/2008   Leg weakness 11/23/2011   Acute posthemorrhagic anemia 08/22/2012   Closed left subtrochanteric femur fracture S/P Open/closed and reduction, internal medullary fixation  09/05/2012   Left foot pain 09/02/2014   CAD (coronary artery disease), native coronary artery 05/20/2015   Coronary artery disease involving native coronary artery of native heart with angina pectoris ([place])    Bronchiectasis without complication ([place]) [id number]   Closed fracture of right olecranon process 05/09/2017   Aftercare 06/06/2017   Bronchiectasis with acute exacerbation ([place]) 04/30/2018   Sacral insufficiency fracture 12/12/2018   Hip fracture ([place]) 12/12/2018   Fracture of inferior pubic ramus ([place]) 02/28/2019   Pre-op evaluation 06/11/2019   Sepsis due to urinary tract infection ([place]) 07/18/2019   Pyelonephritis 07/29/2019   Hydronephrosis 07/29/2019   Sepsis ([place]) 07/30/2019   Edema 10/30/2019   Acute pain of left  wrist 07/11/2019   Acute maxillary sinusitis 10/17/2017   Acute renal failure syndrome ([place]) 01/04/2019   Acute upper respiratory infection 09/11/2019   Disorder of lung 11/23/2012   Heart disease 07/29/2019   Hyperglycemia 05/01/2020   Impacted cerumen 05/01/2020   Iron deficiency anemia 07/29/2019   Sepsis due to Escherichia coli (e. coli) ([place]) 07/29/2019   Ecchymosis 08/08/2021   SVT (supraventricular tachycardia) 08/23/2021   Past Medical History:  Diagnosis Date   Allergy    Arthritis    Asthma    Baker's cyst of knee, right    Carotid stenosis    Dysrhythmia    Elbow fracture 04/2017   Esophageal reflux    Hematochezia    History of echocardiogram    Hx of colonoscopy    Macular pucker, right eye    Osteoporosis    Other chronic pulmonary heart diseases    Pneumonia    Pneumonia 09/18/2019   SOCIAL HX:  Social History   Tobacco Use   Smoking status: Never   Smokeless tobacco: Never  Substance Use Topics   Alcohol use: No   FAMILY  HX:  Family History  Problem Relation Age of Onset   Colon cancer Mother 18   Cancer Mother    Lung cancer Father    Heart disease Father    Arrhythmia Father    Cancer Father    Cancer Sister        ovarian   Heart attack Brother    Esophageal cancer Neg Hx    Rectal cancer Neg Hx    Stomach cancer Neg Hx    Stroke Neg Hx      Preferred Pharmacy: ALLERGIES:  Allergies  Allergen Reactions   Levaquin [Levofloxacin] Other (See Comments)    Joint pain.. Per doctor not to take again   Oxycodone Nausea Only    Can take oxycodone with Zofran Other reaction(s): vomiting   Sulfonamide Derivatives Other (See Comments)    Possibly caused hepatitis   Sulfa Antibiotics Other (See Comments)    Possibly caused hepatitis   Morphine Nausea Only     PERTINENT MEDICATIONS:  Outpatient Encounter Medications as of 05/16/2022  Medication Sig   acetaminophen (TYLENOL) 500 MG tablet Take 1,000 mg by mouth every 6 (six) hours as  needed for mild pain.    amiodarone (PACERONE) 100 MG tablet Take 1 tablet (100 mg total) by mouth daily.   Budeson-Glycopyrrol-Formoterol (BREZTRI AEROSPHERE) 160-9-4.8 MCG/ACT AERO Inhale 2 puffs into the lungs in the morning and at bedtime.   Calcium Carbonate Antacid (TUMS ULTRA 1000 PO) Take 2,000-3,000 mg by mouth at bedtime as needed (acid reflux/indigestion/heartburn).   ELIQUIS 2.5 MG TABS tablet TAKE 1 TABLET(2.5 MG) BY MOUTH TWICE DAILY (Patient taking differently: Take 2.5 mg by mouth 2 (two) times daily.)   guaiFENesin (MUCINEX) 600 MG 12 hr tablet Take 2 tablets (1,200 mg total) by mouth 2 (two) times daily.   ipratropium-albuterol (DUONEB) 0.5-2.5 (3) MG/3ML SOLN Take 3 mLs by nebulization every 6 (six) hours as needed (wheezing; shortness of breath).   omeprazole (PRILOSEC) 40 MG capsule Take 40 mg by mouth in the morning and at bedtime.   polyethylene glycol (MIRALAX / GLYCOLAX) 17 g packet Take 17 g by mouth daily.   sertraline (ZOLOFT) 50 MG tablet Take 1 tablet (50 mg total) by mouth at bedtime.   No facility-administered encounter medications on file as of 05/16/2022.    History obtained from review of EMR, discussion with facility staff, caregiver, and patient.   CBC    Component Value Date/Time   WBC 9.2 04/22/2022 0432   RBC 3.76 (L) 04/22/2022 0432   HGB 10.6 (L) 04/22/2022 0432   HGB 12.7 07/21/2020 1315   HCT 34.1 (L) 04/22/2022 0432   HCT 38.4 07/21/2020 1315   PLT 219 04/22/2022 0432   PLT 309 07/21/2020 1315   MCV 90.7 04/22/2022 0432   MCV 94 07/21/2020 1315   MCH 28.2 04/22/2022 0432   MCHC 31.1 04/22/2022 0432   RDW 18.3 (H) 04/22/2022 0432   RDW 12.7 07/21/2020 1315   LYMPHSABS 1.6 04/18/2022 1631   MONOABS 0.7 04/18/2022 1631   EOSABS 0.0 04/18/2022 1631   BASOSABS 0.1 04/18/2022 1631    CMP    Latest Ref Rng & Units 04/29/2022    4:22 AM 04/27/2022    4:10 AM 04/24/2022    3:45 AM  CMP  Glucose 70 - 99 mg/dL 97  86  112   BUN 8 - 23 mg/dL 15   11  11    Creatinine 0.44 - 1.00 mg/dL 0.68  0.68  0.67  Sodium 135 - 145 mmol/L 134  136  137   Potassium 3.5 - 5.1 mmol/L 3.6  3.8  4.0   Chloride 98 - 111 mmol/L 103  109  111   CO2 22 - 32 mmol/L 22  21  22    Calcium 8.9 - 10.3 mg/dL 7.9  7.7  8.0     LFTs    Latest Ref Rng & Units 04/18/2022    4:31 PM 03/10/2022    4:00 AM 03/09/2022    1:25 PM  Hepatic Function  Total Protein 6.5 - 8.1 g/dL 5.3  5.1  5.9   Albumin 3.5 - 5.0 g/dL 2.8  2.8  3.3   AST 15 - 41 U/L 19  15  24    ALT 0 - 44 U/L 13  14  15    Alk Phosphatase 38 - 126 U/L 59  64  78   Total Bilirubin 0.3 - 1.2 mg/dL 1.1  0.6  0.5     Urinalysis    Component Value Date/Time   COLORURINE AMBER (A) 04/18/2022 2122   APPEARANCEUR CLEAR 04/18/2022 2122   LABSPEC 1.018 04/18/2022 2122   PHURINE 5.0 04/18/2022 2122   GLUCOSEU NEGATIVE 04/18/2022 2122   HGBUR NEGATIVE 04/18/2022 2122   BILIRUBINUR NEGATIVE 04/18/2022 2122   BILIRUBINUR negative 03/21/2018 1328   KETONESUR 5 (A) 04/18/2022 2122   PROTEINUR NEGATIVE 04/18/2022 2122   UROBILINOGEN negative (A) 03/21/2018 1328   UROBILINOGEN 0.2 12/08/2008 1204   NITRITE NEGATIVE 04/18/2022 2122   LEUKOCYTESUR NEGATIVE 04/18/2022 2122     BNP (last 3 results) Recent Labs    08/07/21 1900 03/09/22 1325 04/18/22 1631  BNP 387.3* 254.2* 290.4*    I reviewed available labs, medications, imaging, studies and related documents from the EMR.  Records reviewed and summarized above.   Physical Exam: GENERAL: NAD LUNGS: CTAB, no increased work of breathing, on room air, Orthopnea with laying flat requiring 2 pillows for positioning  CARDIAC:  S1S2, IRIR, rate controlled with no MRG, 3+edema to LLE 2+ edema to RLE , Slight cyanosis to L 2nd toe  ABD:  Hypo-active BS x 4 quads, soft, non-tender, residual urine 34cc with bladder scan  EXTREMITIES: Decreased ROM, no deformity, strength equal but deconditioned, No muscle atrophy/subcutaneous fat loss NEURO:  Noted  generalized weakness  PSYCH:  non-anxious affect, A & O x 3   Thank you for the opportunity to participate in the care of [name]. Please call our main office at [phone] if we can be of additional assistance.    Damaris Hippo FNP-C  [email or web address] .[facility]  Phone:  [phone]

## 2022-05-20 ENCOUNTER — Other Ambulatory Visit: Payer: Medicare Other

## 2022-05-20 ENCOUNTER — Encounter: Payer: Self-pay | Admitting: Physician Assistant

## 2022-05-20 ENCOUNTER — Ambulatory Visit: Payer: Medicare Other | Admitting: Physician Assistant

## 2022-05-20 ENCOUNTER — Ambulatory Visit: Payer: Medicare Other | Attending: Physician Assistant | Admitting: Physician Assistant

## 2022-05-20 VITALS — BP 122/58 | HR 66 | Ht 63.0 in | Wt 125.0 lb

## 2022-05-20 DIAGNOSIS — R0989 Other specified symptoms and signs involving the circulatory and respiratory systems: Secondary | ICD-10-CM | POA: Diagnosis present

## 2022-05-20 DIAGNOSIS — I480 Paroxysmal atrial fibrillation: Secondary | ICD-10-CM | POA: Insufficient documentation

## 2022-05-20 DIAGNOSIS — I10 Essential (primary) hypertension: Secondary | ICD-10-CM | POA: Insufficient documentation

## 2022-05-20 DIAGNOSIS — Z79899 Other long term (current) drug therapy: Secondary | ICD-10-CM | POA: Diagnosis present

## 2022-05-20 DIAGNOSIS — I5032 Chronic diastolic (congestive) heart failure: Secondary | ICD-10-CM

## 2022-05-20 DIAGNOSIS — R600 Localized edema: Secondary | ICD-10-CM | POA: Diagnosis present

## 2022-05-20 DIAGNOSIS — E782 Mixed hyperlipidemia: Secondary | ICD-10-CM | POA: Diagnosis present

## 2022-05-20 DIAGNOSIS — R0602 Shortness of breath: Secondary | ICD-10-CM | POA: Diagnosis present

## 2022-05-20 DIAGNOSIS — I2510 Atherosclerotic heart disease of native coronary artery without angina pectoris: Secondary | ICD-10-CM | POA: Insufficient documentation

## 2022-05-20 DIAGNOSIS — D869 Sarcoidosis, unspecified: Secondary | ICD-10-CM | POA: Diagnosis present

## 2022-05-20 DIAGNOSIS — I471 Supraventricular tachycardia, unspecified: Secondary | ICD-10-CM | POA: Diagnosis present

## 2022-05-20 MED ORDER — FUROSEMIDE 20 MG PO TABS: ORAL_TABLET | ORAL | 0 refills | Status: DC

## 2022-05-20 NOTE — Progress Notes (Addendum)
Office Visit    Patient Name: [name] Date of Encounter: 05/20/2022  PCP:  Garwin Brothers, [place]  Cardiologist:  Sherren Mocha, MD  Advanced Practice Provider:  Liliane Shi, PA-C Electrophysiologist:  None   HPI    [name] is a 88 y.o. female presents today for follow-up.   Patient Profile: Coronary artery disease Cath 3/17: oLCx 60-70 (neg by FFR) Myoview 11/16: no ischemia Myoview 07/2018: EF 73, prob low risk (significant extracardiac uptake), no ischemia Myoview 6/22: low risk  Cath 07/2021: LCx 60-70 (no ? from last cath), LAD 40, RCA 30 - Med Rx Paroxysmal atrial fibrillation  CHA2DS2-VASc=5 (CAD, HTN, age x 2, female) >> Apixaban 2.5 mg (> 29 yo, wt <60 kg)  (HFpEF) heart failure with preserved ejection fraction  Echo 8/21: EF 55-60, GR 1 DD, RVSP 24.2, trivial MR Echocardiogram 6/23: EF 55-60, Gr 2 DD Aortic atherosclerosis  Pulmonary sarcoidosis Echo 8/21: RVSP 24.2 Echo 5/21: RVSP 36.9 Bronchiectasis Hypertension Hyperlipidemia SVT Carotid stenosis [place] 06/2018: bilat 1-39 Dementia     The patient was seen by Dr. Burt Knack January 2023 and ended up admitted 6/17 through 6/23 with chest pain and mildly elevated troponin level (23>> 33).  Echocardiogram showed normal EF.  Cardiac catheterization demonstrated normal anatomy without change from cath in 2017.  Medical prescription was recommended.  Reviewed of DC notes which indicated she noted progressive dyspnea and pulmonology evaluation was arranged.  There was also reported SVT by EMS.  Recommendation was to consider outpatient ZIO monitor.  She then saw [place] last summer for follow-up.  Since discharge she had done well.  She said Dr. [name] saw her last weekend and medications were adjusted.  She felt her breathing was better.  She had not had any further palpitations.  Blood pressure was running a little low and daughter had not given her the metoprolol.  No further  chest pain.  Had not had syncope, orthopnea, or leg edema.  Not currently on Lasix at that time.  Today, her daughter tells me that in January she had RSV from the 17th to the 28th.  They sent her to [facility] since she needs rehab.  There, she had an aspiration pneumonia and did not do well.  2 weeks later got worse and she is struggling with her breathing.  She was on 6 L then down to 2 L at [facility].  She was there from January 28 to February 26.  She was found to be in uncontrolled atrial fibrillation and had urinary retention.  She was very sick at that time and sent to [facility].  There, she was told that she has some esophageal motility issues (hence the aspiration pneumonia) and she left [place] long March a bleeding with a catheter.  She had catheter removed 3/22.  A week later 140 mL of urine was found in her bladder and she was in and out cath.  On Cipro and she plans to get the culture back on Monday.  Last weekend she has some right foot swelling which then progressed to right and left foot swelling.  We went through several options to treat her lower extremity edema.  Reports no shortness of breath nor dyspnea on exertion. Reports no chest pain, pressure, or tightness. No  orthopnea, PND. Reports no palpitations.   Past Medical History    Past Medical History:  Diagnosis Date   Acute maxillary sinusitis 10/17/2017   Acute  posthemorrhagic anemia 08/22/2012   Acute renal failure syndrome ([place]) 01/04/2019   Allergy    SEASONAL   Anemia    Arthritis    Asthma    "slight"    Baker's cyst of knee, right    CAD (coronary artery disease)    a. Myoview 10/15 - normal EF 70% // Myoview 11/16: EF 75%, normal perfusion, low risk // c. LHC 3/17 - LAD irregs, oLCx 70 (neg FFR), mRCA 30, EF 55-65% >> med Rx // Myoview 07/2018:  EF 73, extracardiac uptake, no significant ischemia (reviewed with Dr. Burt Knack), Low Risk // Myoview 6/22: EF 72, normal perfusion, low risk   Carotid stenosis    a.  Carotid US 9/15 - Bilateral 1-39% ICA >> FU 2 years // b. Bilateral ICA 1-39 >> FU prn // Carotid US 06/2018: bilat 1-39; fu prn   Closed fracture of right olecranon process 05/09/2017   Closed left subtrochanteric femur fracture S/P Open/closed and reduction, internal medullary fixation  09/05/2012   Dyspnea    due to Sarcoidosis. When is windy or humed   Dysrhythmia    fast heart rate   Edema 10/30/2019   Elbow fracture 04/2017   Right, had surgery   Esophageal reflux    Fracture of inferior pubic ramus ([place]) 02/28/2019   Hematochezia    Hip fracture ([place]) 12/12/2018   History of echocardiogram    a. Echo 10/15 - EF 60-65%, no RWMA   HLD (hyperlipidemia)    Hx of colonoscopy    Hydronephrosis 07/29/2019   Left foot pain 09/02/2014   Assessment: 88 year old with acquired leg length discrepancy on the left who presents with 10 days of acute onset left foot pain. She seemed to be doing well prior to this pain with her orthotics that had been made to correct her leg length discrepancy. Negative x-rays at outside office which were again reviewed today.   This pain does not seem to impair her function and which has actually been im   Macular pucker, right eye    will have removed on 01/26/21   Osteoporosis    Other chronic pulmonary heart diseases    Paroxysmal supraventricular tachycardia    Pneumonia    Pneumonia 09/18/2019   Pre-op evaluation 06/11/2019   Pyelonephritis 07/29/2019   Sacral insufficiency fracture 12/12/2018   Sarcoidosis    Sepsis ([place]) 07/30/2019   Sepsis due to Escherichia coli (e. 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   COLONOSCOPY     CYSTOSCOPY W/ RETROGRADES Left 07/30/2019   Procedure: CYSTOSCOPY WITH RETROGRADE PYELOGRAM LEFT STENT PLACEMENT;  Surgeon: Ardis Hughs, MD;  Location: [facility];  Service: Urology;  Laterality: Left;   CYSTOSCOPY WITH RETROGRADE PYELOGRAM, URETEROSCOPY AND STENT PLACEMENT Left 08/20/2019   Procedure: CYSTOSCOPY, URETEROSCOPY AND STENT PLACEMENT;  Surgeon: Robley Fries, MD;  Location: [facility];  Service: Urology;  Laterality: Left;  1 HR   FOOT SURGERY Right    HIP SURGERY Right 2011   full replacement   HOLMIUM LASER APPLICATION Left [id number]  Procedure: HOLMIUM LASER APPLICATION;  Surgeon: Robley Fries, MD;  Location: [facility];  Service: Urology;  Laterality: Left;   LEFT HEART CATH AND CORONARY ANGIOGRAPHY N/A 08/09/2021   Procedure: LEFT HEART CATH AND CORONARY ANGIOGRAPHY;  Surgeon: Sherren Mocha, MD;  Location: [place] [facility];  Service: Cardiovascular;  Laterality: N/A;   LEG SURGERY Left 06/2012   femur fracture s/p open and closed reduction in [place], Dr. Jimmye Norman   lens replacement Right    right eye   ORIF ELBOW FRACTURE Right 05/09/2017   Procedure: ORIF right olecranon fracture with repair/reconstruction, ulnar nerve transposition as needed;  Surgeon: Roseanne Kaufman, MD;  Location: [facility];  Service: Orthopedics;  Laterality: Right;  Requests 90 mins   pelvis fracture     POLYPECTOMY     REVERSE SHOULDER ARTHROPLASTY Left 06/13/2019   Procedure: REVERSE SHOULDER ARTHROPLASTY;  Surgeon: Justice Britain, MD;  Location: [facility];  Service: Orthopedics;  Laterality: Left;  151min   SHOULDER ARTHROSCOPY W/ ROTATOR CUFF REPAIR Right    TRAPEZIUM RESECTION Right     Allergies  Allergies  Allergen Reactions   Levaquin [Levofloxacin] Other (See Comments)    Joint pain.. Per doctor not to take again   Oxycodone Nausea Only    Can take oxycodone with Zofran Other  reaction(s): vomiting   Sulfonamide Derivatives Other (See Comments)    Possibly caused hepatitis   Sulfa Antibiotics Other (See Comments)    Possibly caused hepatitis   Morphine Nausea Only     EKGs/Labs/Other Studies Reviewed:   The following studies were reviewed today: Cardiac catheterization 08/09/21 1.  Mild nonobstructive plaquing in the RCA (dominant vessel), unchanged from previous study 2.  Patent left main with no significant stenosis 3.  Patent LAD with mild proximal vessel stenosis of 40% 4.  Moderate ostial circumflex stenosis estimated at 60 to 70%, unchanged from the previous study.    Echocardiogram 08/08/21 EF 55-60, no RWMA, Gr 2 DD, normal RVSF, normal PASP (RVSP 31.3), mild MR, mild to mod TR, AV sclerosis w/o AS   GATED SPECT MYO PERF W/LEXISCAN STRESS 1D 08/05/2020 EF 72, normal perfusion, low risk   Echocardiogram 09/23/19 EF 55-60, GR 1 DD, RVSP 24.2, trivial MR   Carotid US 07/09/2018 Bilat ICA 1-39  EKG:  EKG is not ordered today.   Recent Labs: 04/18/2022: ALT 13; B Natriuretic Peptide 290.4; TSH 1.296 04/22/2022: Hemoglobin 10.6; Magnesium 1.9; Platelets 219 04/29/2022: BUN 15; Creatinine, Ser 0.68; Potassium 3.6; Sodium 134  Recent Lipid Panel    Component Value Date/Time   CHOL 188 08/08/2021 0124   CHOL 149 11/27/2017 0800   TRIG 81 08/08/2021 0124   HDL 67 08/08/2021 0124   HDL 73 11/27/2017 0800   CHOLHDL 2.8 08/08/2021 0124   VLDL 16 08/08/2021 0124   LDLCALC 105 (H) 08/08/2021 0124   LDLCALC 63 11/27/2017 0800     Home Medications   Current Meds  Medication Sig   acetaminophen (TYLENOL) 500 MG tablet Take 1,000 mg by mouth every 6 (six) hours as needed for mild pain.    amiodarone (PACERONE) 100 MG tablet Take 1 tablet (100 mg total) by mouth daily.   Budeson-Glycopyrrol-Formoterol (BREZTRI AEROSPHERE) 160-9-4.8 MCG/ACT AERO Inhale 2 puffs into the lungs in the morning and at bedtime.   Calcium Carbonate Antacid (TUMS ULTRA 1000 PO)  Take 2,000-3,000 mg by mouth at bedtime as needed (acid reflux/indigestion/heartburn).   ciprofloxacin (CIPRO) 500 MG tablet Take 500 mg by mouth every 8 (eight)  hours as needed.   ELIQUIS 2.5 MG TABS tablet TAKE 1 TABLET(2.5 MG) BY MOUTH TWICE DAILY   furosemide (LASIX) 20 MG tablet Take 1 tablet by mouth daily for 5 days, then take as needed thereafter   guaiFENesin (MUCINEX) 600 MG 12 hr tablet Take 2 tablets (1,200 mg total) by mouth 2 (two) times daily.   ipratropium-albuterol (DUONEB) 0.5-2.5 (3) MG/3ML SOLN Take 3 mLs by nebulization every 6 (six) hours as needed (wheezing; shortness of breath).   omeprazole (PRILOSEC) 40 MG capsule Take 40 mg by mouth in the morning and at bedtime.   polyethylene glycol (MIRALAX / GLYCOLAX) 17 g packet Take 17 g by mouth daily.   sertraline (ZOLOFT) 50 MG tablet Take 1 tablet (50 mg total) by mouth at bedtime.     Review of Systems      All other systems reviewed and are otherwise negative except as noted above.  Physical Exam    VS:  BP (!) 122/58   Pulse 66   Ht 5\' 3"  (1.6 m)   Wt 125 lb (56.7 kg)   LMP 02/22/1983 (Approximate)   SpO2 98%   BMI 22.14 kg/m  , BMI Body mass index is 22.14 kg/m.  Wt Readings from Last 3 Encounters:  05/20/22 125 lb (56.7 kg)  04/18/22 127 lb 13.9 oz (58 kg)  03/18/22 129 lb 3 oz (58.6 kg)     GEN: Well nourished, well developed, in no acute distress. HEENT: normal. Neck: Supple, no JVD, carotid bruits, or masses. Cardiac: Irregularly irregular, no murmurs, rubs, or gallops. No clubbing, cyanosis, 3-4 + pitting edema R>L.  Radials/PT 2+ and equal bilaterally.  Respiratory:  Respirations regular and unlabored, clear to auscultation bilaterally. GI: Soft, nontender, nondistended. MS: No deformity or atrophy. Skin: Warm and dry, no rash. Neuro:  Strength and sensation are intact. Psych: Normal affect.  Assessment & Plan    Chronic heart failure with preserved ejection fraction -Now with lower  extremity edema right greater than left -Will plan to repeat an echocardiogram -We recommended a low-sodium diet -Recommended lower extremity compression -We ordered a bilateral ultrasound to rule out DVT since she has not been very mobile with all of her sickness -We ordered Lasix 20 mg daily x 5 days and then as needed -We will plan for repeat BMP in a week -Daily weights  Atrial fibrillation -Rate controlled today -Asymptomatic -Continue current medications which include amiodarone 100 mg daily, Eliquis 2.5 mg twice daily  CAD, nonobstructive -No chest pain -Continue current medications -Not on aspirin since she is taking Eliquis  Essential hypertension -Well-controlled today 122/58 -Continue current medication regimen -Have the facility check blood pressure daily  Hyperlipidemia -Last LDL 109 -Goal LDL would be less than 70 -We can address this when next time she is in the office  Paroxysmal supraventricular tachycardia -Not discussed today  Sarcoidosis, pulmonary -Not discussed today          Disposition: Follow up 2-3 months we discussed a lot today.  Ultimately, decided to with Sherren Mocha, MD or APP.  Signed, Elgie Collard, PA-C 05/20/2022, 5:17 PM [facility]

## 2022-05-20 NOTE — Patient Instructions (Signed)
Medication Instructions:  Start furosemide (lasix) 20 mg daily for 5 days, then take as needed therafter *If you need a refill on your cardiac medications before your next appointment, please call your pharmacy*   Lab Work: BMET in a week If you have labs (blood work) drawn today and your tests are completely normal, you will receive your results only by: [facility] (if you have MyChart) OR A paper copy in the mail If you have any lab test that is abnormal or we need to change your treatment, we will call you to review the results.   Testing/Procedures: Your physician has requested that you have an echocardiogram. Echocardiography is a painless test that uses sound waves to create images of your heart. It provides your doctor with information about the size and shape of your heart and how well your heart's chambers and valves are working. This procedure takes approximately one hour. There are no restrictions for this procedure. Please do NOT wear cologne, perfume, aftershave, or lotions (deodorant is allowed). Please arrive 15 minutes prior to your appointment time.   Your physician has requested that you have a lower or upper extremity venous duplex. This test is an ultrasound of the veins in the legs or arms. It looks at venous blood flow that carries blood from the heart to the legs or arms. Allow one hour for a Lower Venous exam. Allow thirty minutes for an Upper Venous exam. There are no restrictions or special instructions.   Follow-Up: At [facility], you and your health needs are our priority.  As part of our continuing mission to provide you with exceptional heart care, we have created designated Provider Care Teams.  These Care Teams include your primary Cardiologist (physician) and Advanced Practice Providers (APPs -  Physician Assistants and Nurse Practitioners) who all work together to provide you with the care you need, when you need it.    Your next appointment:    2-3 month(s)  Provider:   Sherren Mocha, MD  or APP  Other Instructions Weigh every morning after using the restroom, before breakfast and let us know if you have a weight gain of 2 lbs or more overnight or 5 lbs or more in a week  Low-Sodium Eating Plan Sodium, which is an element that makes up salt, helps you maintain a healthy balance of fluids in your body. Too much sodium can increase your blood pressure and cause fluid and waste to be held in your body. Your health care provider or dietitian may recommend following this plan if you have high blood pressure (hypertension), kidney disease, liver disease, or heart failure. Eating less sodium can help lower your blood pressure, reduce swelling, and protect your heart, liver, and kidneys. What are tips for following this plan? Reading food labels The Nutrition Facts label lists the amount of sodium in one serving of the food. If you eat more than one serving, you must multiply the listed amount of sodium by the number of servings. Choose foods with less than 140 mg of sodium per serving. Avoid foods with 300 mg of sodium or more per serving. Shopping  Look for lower-sodium products, often labeled as "low-sodium" or "no salt added." Always check the sodium content, even if foods are labeled as "unsalted" or "no salt added." Buy fresh foods. Avoid canned foods and pre-made or frozen meals. Avoid canned, cured, or processed meats. Buy breads that have less than 80 mg of sodium per slice. Cooking  Eat more  home-cooked food and less restaurant, buffet, and fast food. Avoid adding salt when cooking. Use salt-free seasonings or herbs instead of table salt or sea salt. Check with your health care provider or pharmacist before using salt substitutes. Cook with plant-based oils, such as canola, sunflower, or olive oil. Meal planning When eating at a restaurant, ask that your food be prepared with less salt or no salt, if possible. Avoid  dishes labeled as brined, pickled, cured, smoked, or made with soy sauce, miso, or teriyaki sauce. Avoid foods that contain MSG (monosodium glutamate). MSG is sometimes added to [place] food, bouillon, and some canned foods. Make meals that can be grilled, baked, poached, roasted, or steamed. These are generally made with less sodium. General information Most people on this plan should limit their sodium intake to 1,500-2,000 mg (milligrams) of sodium each day. What foods should I eat? Fruits Fresh, frozen, or canned fruit. Fruit juice. Vegetables Fresh or frozen vegetables. "No salt added" canned vegetables. "No salt added" tomato sauce and paste. Low-sodium or reduced-sodium tomato and vegetable juice. Grains Low-sodium cereals, including oats, puffed wheat and rice, and shredded wheat. Low-sodium crackers. Unsalted rice. Unsalted pasta. Low-sodium bread. Whole-grain breads and whole-grain pasta. Meats and other proteins Fresh or frozen (no salt added) meat, poultry, seafood, and fish. Low-sodium canned tuna and salmon. Unsalted nuts. Dried peas, beans, and lentils without added salt. Unsalted canned beans. Eggs. Unsalted nut butters. Dairy Milk. Soy milk. Cheese that is naturally low in sodium, such as ricotta cheese, fresh mozzarella, or Swiss cheese. Low-sodium or reduced-sodium cheese. Cream cheese. Yogurt. Seasonings and condiments Fresh and dried herbs and spices. Salt-free seasonings. Low-sodium mustard and ketchup. Sodium-free salad dressing. Sodium-free light mayonnaise. Fresh or refrigerated horseradish. Lemon juice. Vinegar. Other foods Homemade, reduced-sodium, or low-sodium soups. Unsalted popcorn and pretzels. Low-salt or salt-free chips. The items listed above may not be a complete list of foods and beverages you can eat. Contact a dietitian for more information. What foods should I avoid? Vegetables Sauerkraut, pickled vegetables, and relishes. Olives. [place] fries. Onion  rings. Regular canned vegetables (not low-sodium or reduced-sodium). Regular canned tomato sauce and paste (not low-sodium or reduced-sodium). Regular tomato and vegetable juice (not low-sodium or reduced-sodium). Frozen vegetables in sauces. Grains Instant hot cereals. Bread stuffing, pancake, and biscuit mixes. Croutons. Seasoned rice or pasta mixes. Noodle soup cups. Boxed or frozen macaroni and cheese. Regular salted crackers. Self-rising flour. Meats and other proteins Meat or fish that is salted, canned, smoked, spiced, or pickled. Precooked or cured meat, such as sausages or meat loaves. Berniece Salines. Ham. Pepperoni. Hot dogs. Corned beef. Chipped beef. Salt pork. Jerky. Pickled herring. Anchovies and sardines. Regular canned tuna. Salted nuts. Dairy Processed cheese and cheese spreads. Hard cheeses. Cheese curds. Blue cheese. Feta cheese. String cheese. Regular cottage cheese. Buttermilk. Canned milk. Fats and oils Salted butter. Regular margarine. Ghee. Bacon fat. Seasonings and condiments Onion salt, garlic salt, seasoned salt, table salt, and sea salt. Canned and packaged gravies. Worcestershire sauce. Tartar sauce. Barbecue sauce. Teriyaki sauce. Soy sauce, including reduced-sodium. Steak sauce. Fish sauce. Oyster sauce. Cocktail sauce. Horseradish that you find on the shelf. Regular ketchup and mustard. Meat flavorings and tenderizers. Bouillon cubes. Hot sauce. Pre-made or packaged marinades. Pre-made or packaged taco seasonings. Relishes. Regular salad dressings. Salsa. Other foods Salted popcorn and pretzels. Corn chips and puffs. Potato and tortilla chips. Canned or dried soups. Pizza. Frozen entrees and pot pies. The items listed above may not be a complete list of foods  and beverages you should avoid. Contact a dietitian for more information. Summary Eating less sodium can help lower your blood pressure, reduce swelling, and protect your heart, liver, and kidneys. Most people on this plan  should limit their sodium intake to 1,500-2,000 mg (milligrams) of sodium each day. Canned, boxed, and frozen foods are high in sodium. Restaurant foods, fast foods, and pizza are also very high in sodium. You also get sodium by adding salt to food. Try to cook at home, eat more fresh fruits and vegetables, and eat less fast food and canned, processed, or prepared foods. This information is not intended to replace advice given to you by your health care provider. Make sure you discuss any questions you have with your health care provider. Document Revised: 01/14/2019 Document Reviewed: 01/09/2019 Elsevier Patient Education  [place] Many factors influence your heart health, including eating and exercise habits. Heart health is also called coronary health. Coronary risk increases with abnormal blood fat (lipid) levels. A heart-healthy eating plan includes limiting unhealthy fats, increasing healthy fats, limiting salt (sodium) intake, and making other diet and lifestyle changes. What is my plan? Your health care provider may recommend that: You limit your fat intake to _________% or less of your total calories each day. You limit your saturated fat intake to _________% or less of your total calories each day. You limit the amount of cholesterol in your diet to less than _________ mg per day. You limit the amount of sodium in your diet to less than _________ mg per day. What are tips for following this plan? Cooking Cook foods using methods other than frying. Baking, boiling, grilling, and broiling are all good options. Other ways to reduce fat include: Removing the skin from poultry. Removing all visible fats from meats. Steaming vegetables in water or broth. Meal planning  At meals, imagine dividing your plate into fourths: Fill one-half of your plate with vegetables and green salads. Fill one-fourth of your plate with whole grains. Fill one-fourth of  your plate with lean protein foods. Eat 2-4 cups of vegetables per day. One cup of vegetables equals 1 cup (91 g) broccoli or cauliflower florets, 2 medium carrots, 1 large bell pepper, 1 large sweet potato, 1 large tomato, 1 medium white potato, 2 cups (150 g) raw leafy greens. Eat 1-2 cups of fruit per day. One cup of fruit equals 1 small apple, 1 large banana, 1 cup (237 g) mixed fruit, 1 large orange,  cup (82 g) dried fruit, 1 cup (240 mL) 100% fruit juice. Eat more foods that contain soluble fiber. Examples include apples, broccoli, carrots, beans, peas, and barley. Aim to get 25-30 g of fiber per day. Increase your consumption of legumes, nuts, and seeds to 4-5 servings per week. One serving of dried beans or legumes equals  cup (90 g) cooked, 1 serving of nuts is  oz (12 almonds, 24 pistachios, or 7 walnut halves), and 1 serving of seeds equals  oz (8 g). Fats Choose healthy fats more often. Choose monounsaturated and polyunsaturated fats, such as olive and canola oils, avocado oil, flaxseeds, walnuts, almonds, and seeds. Eat more omega-3 fats. Choose salmon, mackerel, sardines, tuna, flaxseed oil, and ground flaxseeds. Aim to eat fish at least 2 times each week. Check food labels carefully to identify foods with trans fats or high amounts of saturated fat. Limit saturated fats. These are found in animal products, such as meats, butter, and cream. Plant sources of saturated  fats include palm oil, palm kernel oil, and coconut oil. Avoid foods with partially hydrogenated oils in them. These contain trans fats. Examples are stick margarine, some tub margarines, cookies, crackers, and other baked goods. Avoid fried foods. General information Eat more home-cooked food and less restaurant, buffet, and fast food. Limit or avoid alcohol. Limit foods that are high in added sugar and simple starches such as foods made using white refined flour (white breads, pastries, sweets). Lose weight if you  are overweight. Losing just 5-10% of your body weight can help your overall health and prevent diseases such as diabetes and heart disease. Monitor your sodium intake, especially if you have high blood pressure. Talk with your health care provider about your sodium intake. Try to incorporate more vegetarian meals weekly. What foods should I eat? Fruits All fresh, canned (in natural juice), or frozen fruits. Vegetables Fresh or frozen vegetables (raw, steamed, roasted, or grilled). Green salads. Grains Most grains. Choose whole wheat and whole grains most of the time. Rice and pasta, including brown rice and pastas made with whole wheat. Meats and other proteins Lean, well-trimmed beef, veal, pork, and lamb. Chicken and [place] without skin. All fish and shellfish. Wild duck, rabbit, pheasant, and venison. Egg whites or low-cholesterol egg substitutes. Dried beans, peas, lentils, and tofu. Seeds and most nuts. Dairy Low-fat or nonfat cheeses, including ricotta and mozzarella. Skim or 1% milk (liquid, powdered, or evaporated). Buttermilk made with low-fat milk. Nonfat or low-fat yogurt. Fats and oils Non-hydrogenated (trans-free) margarines. Vegetable oils, including soybean, sesame, sunflower, olive, avocado, peanut, safflower, corn, canola, and cottonseed. Salad dressings or mayonnaise made with a vegetable oil. Beverages Water (mineral or sparkling). Coffee and tea. Unsweetened ice tea. Diet beverages. Sweets and desserts Sherbet, gelatin, and fruit ice. Small amounts of dark chocolate. Limit all sweets and desserts. Seasonings and condiments All seasonings and condiments. The items listed above may not be a complete list of foods and beverages you can eat. Contact a dietitian for more options. What foods should I avoid? Fruits Canned fruit in heavy syrup. Fruit in cream or butter sauce. Fried fruit. Limit coconut. Vegetables Vegetables cooked in cheese, cream, or butter sauce. Fried  vegetables. Grains Breads made with saturated or trans fats, oils, or whole milk. Croissants. Sweet rolls. Donuts. High-fat crackers, such as cheese crackers and chips. Meats and other proteins Fatty meats, such as hot dogs, ribs, sausage, bacon, rib-eye roast or steak. High-fat deli meats, such as salami and bologna. Caviar. Domestic duck and goose. Organ meats, such as liver. Dairy Cream, sour cream, cream cheese, and creamed cottage cheese. Whole-milk cheeses. Whole or 2% milk (liquid, evaporated, or condensed). Whole buttermilk. Cream sauce or high-fat cheese sauce. Whole-milk yogurt. Fats and oils Meat fat, or shortening. Cocoa butter, hydrogenated oils, palm oil, coconut oil, palm kernel oil. Solid fats and shortenings, including bacon fat, salt pork, lard, and butter. Nondairy cream substitutes. Salad dressings with cheese or sour cream. Beverages Regular sodas and any drinks with added sugar. Sweets and desserts Frosting. Pudding. Cookies. Cakes. Pies. Milk chocolate or white chocolate. Buttered syrups. Full-fat ice cream or ice cream drinks. The items listed above may not be a complete list of foods and beverages to avoid. Contact a dietitian for more information. Summary Heart-healthy meal planning includes limiting unhealthy fats, increasing healthy fats, limiting salt (sodium) intake and making other diet and lifestyle changes. Lose weight if you are overweight. Losing just 5-10% of your body weight can help your overall health and  prevent diseases such as diabetes and heart disease. Focus on eating a balance of foods, including fruits and vegetables, low-fat or nonfat dairy, lean protein, nuts and legumes, whole grains, and heart-healthy oils and fats. This information is not intended to replace advice given to you by your health care provider. Make sure you discuss any questions you have with your health care provider. Document Revised: 03/15/2021 Document Reviewed:  03/15/2021 Elsevier Patient Education  [name].

## 2022-05-23 ENCOUNTER — Ambulatory Visit (HOSPITAL_COMMUNITY)
Admission: RE | Admit: 2022-05-23 | Discharge: 2022-05-23 | Disposition: A | Discharge status: Home / Self Care | Payer: No Typology Code available for payment source | Source: Ambulatory Visit | Attending: Internal Medicine | Admitting: Internal Medicine

## 2022-05-23 DIAGNOSIS — R0989 Other specified symptoms and signs involving the circulatory and respiratory systems: Secondary | ICD-10-CM | POA: Diagnosis not present

## 2022-05-24 ENCOUNTER — Ambulatory Visit (HOSPITAL_COMMUNITY): Payer: 59

## 2022-05-25 ENCOUNTER — Ambulatory Visit (HOSPITAL_COMMUNITY): Payer: No Typology Code available for payment source | Attending: Physician Assistant

## 2022-05-25 DIAGNOSIS — R600 Localized edema: Secondary | ICD-10-CM | POA: Insufficient documentation

## 2022-05-25 LAB — ECHOCARDIOGRAM COMPLETE
Area-P 1/2: 3.24 cm2
Est EF: 55
S' Lateral: 2.3 cm

## 2022-05-26 DIAGNOSIS — N390 Urinary tract infection, site not specified: Secondary | ICD-10-CM | POA: Diagnosis not present

## 2022-05-27 ENCOUNTER — Ambulatory Visit: Payer: Medicare Other | Attending: Physician Assistant

## 2022-05-27 ENCOUNTER — Telehealth: Payer: Self-pay | Admitting: Cardiovascular Disease

## 2022-05-27 DIAGNOSIS — I10 Essential (primary) hypertension: Secondary | ICD-10-CM

## 2022-05-27 DIAGNOSIS — I5032 Chronic diastolic (congestive) heart failure: Secondary | ICD-10-CM

## 2022-05-27 DIAGNOSIS — Z79899 Other long term (current) drug therapy: Secondary | ICD-10-CM

## 2022-05-27 DIAGNOSIS — I471 Supraventricular tachycardia, unspecified: Secondary | ICD-10-CM

## 2022-05-27 DIAGNOSIS — I2510 Atherosclerotic heart disease of native coronary artery without angina pectoris: Secondary | ICD-10-CM

## 2022-05-27 DIAGNOSIS — R0602 Shortness of breath: Secondary | ICD-10-CM

## 2022-05-27 DIAGNOSIS — I480 Paroxysmal atrial fibrillation: Secondary | ICD-10-CM

## 2022-05-27 DIAGNOSIS — E782 Mixed hyperlipidemia: Secondary | ICD-10-CM

## 2022-05-27 DIAGNOSIS — D869 Sarcoidosis, unspecified: Secondary | ICD-10-CM

## 2022-05-27 NOTE — Telephone Encounter (Signed)
Patient daughter [name] ( listed on DPR) stated her mother legs are still swollen . Her left leg is worse than the right leg. Pt daughter will like to know if her mother should continue taking lasix.  If so, please fax the prescription to [phone]  and ATTN: Inetta Fermo. Pt currently resides in assistive living. Will forward to APP and MA.

## 2022-05-27 NOTE — Telephone Encounter (Signed)
Patient's daughter is requesting call back in regards to results.

## 2022-05-28 LAB — BASIC METABOLIC PANEL
BUN/Creatinine Ratio: 19 (ref 12–28)
BUN: 14 mg/dL (ref 8–27)
CO2: 21 mmol/L (ref 20–29)
Calcium: 9.1 mg/dL (ref 8.7–10.3)
Chloride: 105 mmol/L (ref 96–106)
Creatinine, Ser: 0.72 mg/dL (ref 0.57–1.00)
Glucose: 117 mg/dL — ABNORMAL HIGH (ref 70–99)
Potassium: 4.7 mmol/L (ref 3.5–5.2)
Sodium: 141 mmol/L (ref 134–144)
eGFR: 81 mL/min/{1.73_m2} (ref >59)

## 2022-06-02 ENCOUNTER — Telehealth: Payer: Self-pay | Admitting: Cardiovascular Disease

## 2022-06-02 ENCOUNTER — Other Ambulatory Visit: Payer: Self-pay

## 2022-06-02 MED ORDER — FUROSEMIDE 20 MG PO TABS: ORAL_TABLET | ORAL | 11 refills | Status: DC

## 2022-06-02 MED ORDER — FUROSEMIDE 20 MG PO TABS: ORAL_TABLET | ORAL | 0 refills | Status: DC

## 2022-06-02 NOTE — Telephone Encounter (Signed)
Please refer to my note:   -Now with lower extremity edema right greater than left  -Will plan to repeat an echocardiogram  -We recommended a low-sodium diet  -Recommended lower extremity compression  -We ordered a bilateral ultrasound to rule out DVT since she has not been very mobile with all of her sickness  -We ordered Lasix 20 mg daily x 5 days and then as needed  -We will plan for repeat BMP in a week  -Daily weights    She should be on lasix "as needed" at this point. She needs to also adhere to all the other above advice.   Thanks!  Sharlene Dory, PA-C   Spoke with daughter and states her mom has been needing to take lasix everyday since the 29th of March, and she still have edema from her feet up to her calves. She states the skilled nursing facility is not using the compression stockings because the aids feel they are difficult to put on. They are wrapping her feet and a little past her ankles however, [name] feels they are not wrapping it properly. She is aware of all the lab results.  She states the facility is getting her weight but is not sure if its daily and she does not know if she gaining weight or not. She stated she is on a low sodium diet. They are following all of your recommendations and she still has the edema in her feet, ankles and legs.

## 2022-06-02 NOTE — Telephone Encounter (Signed)
[name] is aware of new order for lasix.   I faxed new order to living facility. Fax says success  Labs ordered for April 19th.

## 2022-06-02 NOTE — Telephone Encounter (Signed)
[name] is aware of new order for lasix.   I faxed new order to living facility. Fax says success  Labs ordered for April 19th.  

## 2022-06-02 NOTE — Telephone Encounter (Signed)
Patient is returning call.  °

## 2022-06-03 NOTE — Telephone Encounter (Signed)
Left message for patient's daughter Danella Deis informing her that Jari Favre, PA-C does want follow-up labwork on 06/10/22 for patient.  No lab appointment scheduled. Requested callback to schedule lab appointment.

## 2022-06-03 NOTE — Telephone Encounter (Signed)
Pt's daughter [name] called stating that she thought [name] had stated in a vm that labs were not needed on 4/19. Please advise.

## 2022-06-08 ENCOUNTER — Telehealth: Payer: Self-pay | Admitting: Cardiovascular Disease

## 2022-06-08 DIAGNOSIS — I5032 Chronic diastolic (congestive) heart failure: Secondary | ICD-10-CM

## 2022-06-08 NOTE — Telephone Encounter (Signed)
Labs orders faxed to [facility] and notified daughter, Danella Deis, that they were sent.

## 2022-06-08 NOTE — Telephone Encounter (Signed)
Pts daughter Danella Deis, is requesting the lab orders that Jari Favre wants the pt to have done on 4/19, be faxed to [facility], at [phone] to [place]. Danella Deis is requesting this because it would prevent her from having to pay a $39 transport fee. Please advise.

## 2022-06-17 ENCOUNTER — Other Ambulatory Visit: Payer: Self-pay | Admitting: *Deleted

## 2022-06-17 NOTE — Patient Outreach (Signed)
[facility] Post- Acute Care Coordinator follow up. [name] resides in [facility]. Screening for potential [facility] care coordination services as a benefit of health plan and primary care provider.  Facility site visit to [facility]. Spoke with [name] at bedside. [name] reports she is from home with daughter and son in law. Left [facility] Care Management brochure and contact information on bedside table.   Will continue to follow for potential [facility] coordination needs.     Raiford Noble, MSN, RN,BSN [facility] Post Acute Care Coordinator [phone] [facility]) [phone]  (Toll free office)

## 2022-06-20 NOTE — Telephone Encounter (Signed)
Patient's daughter is following up. She would like to know if lab results have been received from [facility]. Please advise.

## 2022-06-20 NOTE — Telephone Encounter (Signed)
Notified patient's daughter, Danella Deis, that we have not received any lab results from [facility]. Provided fax number and Danella Deis states she will have them send [place] the labs.

## 2022-06-28 ENCOUNTER — Other Ambulatory Visit: Payer: Self-pay | Admitting: *Deleted

## 2022-06-28 NOTE — Patient Outreach (Signed)
Per [facility] [name]  discharged from [facility] . Screening for potential [facility] care coordination services as benefit of health plan and Primary Care Provider.   Per Candace Gallus, Administrator, [name] transitioned to [facility].   No identifiable [facility] care coordination needs.   Raiford Noble, MSN, RN,BSN [facility] Post Acute Care Coordinator [phone] (Direct dial)

## 2022-07-12 ENCOUNTER — Telehealth: Payer: Self-pay | Admitting: Cardiovascular Disease

## 2022-07-12 MED ORDER — AMIODARONE HCL 100 MG PO TABS: 100.0000 mg | ORAL_TABLET | Freq: Every day | ORAL | 3 refills | Status: DC

## 2022-07-12 NOTE — Telephone Encounter (Signed)
Prescription faxed to [facility]. Used fax number [phone] which was obtained from calling [facility] (spoke to [place]) to verify fax number for the prescription.

## 2022-07-12 NOTE — Telephone Encounter (Signed)
Received the below message in an email about faxed prescription: Result:           The transmission failed. Explanation:      Lost Communication with called fax machine  I have tried faxing to that number 2 more times. Total of 3 times. I have also sent a Fax via the number that was left in the phone message [phone]).   Called [facility] back and received another fax number to try. [phone]. Person I spoke to said that was a different area but that they would get the fax back over to them if the Pt was not living in that area. Person I spoke with at [facility] said they may be having wifi issues.

## 2022-07-12 NOTE — Telephone Encounter (Signed)
Pt c/o medication issue:  1. Name of Medication: amiodarone (PACERONE) 100 MG tablet   2. How are you currently taking this medication (dosage and times per day)?    3. Are you having a reaction (difficulty breathing--STAT)? no  4. What is your medication issue? Calling because a prescription for the medication needs to be sent to [facility] [place]. Fax # [phone]. Please advise

## 2022-07-13 ENCOUNTER — Inpatient Hospital Stay (HOSPITAL_COMMUNITY)
Admission: EM | Admit: 2022-07-13 | Discharge: 2022-07-19 | DRG: 291 | Disposition: A | Discharge status: Skilled Nursing Facility | Payer: Medicare Other | Source: Skilled Nursing Facility | Attending: Internal Medicine | Admitting: Internal Medicine

## 2022-07-13 ENCOUNTER — Emergency Department (HOSPITAL_COMMUNITY): Payer: Medicare Other

## 2022-07-13 ENCOUNTER — Encounter (HOSPITAL_COMMUNITY): Payer: Self-pay | Admitting: Emergency Medicine

## 2022-07-13 ENCOUNTER — Other Ambulatory Visit: Payer: Self-pay

## 2022-07-13 DIAGNOSIS — E785 Hyperlipidemia, unspecified: Secondary | ICD-10-CM | POA: Diagnosis present

## 2022-07-13 DIAGNOSIS — Z7901 Long term (current) use of anticoagulants: Secondary | ICD-10-CM

## 2022-07-13 DIAGNOSIS — L89611 Pressure ulcer of right heel, stage 1: Secondary | ICD-10-CM | POA: Diagnosis present

## 2022-07-13 DIAGNOSIS — I2510 Atherosclerotic heart disease of native coronary artery without angina pectoris: Secondary | ICD-10-CM | POA: Diagnosis present

## 2022-07-13 DIAGNOSIS — I5033 Acute on chronic diastolic (congestive) heart failure: Secondary | ICD-10-CM | POA: Diagnosis present

## 2022-07-13 DIAGNOSIS — I110 Hypertensive heart disease with heart failure: Principal | ICD-10-CM | POA: Diagnosis present

## 2022-07-13 DIAGNOSIS — I4719 Other supraventricular tachycardia: Secondary | ICD-10-CM | POA: Diagnosis present

## 2022-07-13 DIAGNOSIS — Z96612 Presence of left artificial shoulder joint: Secondary | ICD-10-CM | POA: Diagnosis present

## 2022-07-13 DIAGNOSIS — Z8249 Family history of ischemic heart disease and other diseases of the circulatory system: Secondary | ICD-10-CM | POA: Diagnosis not present

## 2022-07-13 DIAGNOSIS — D638 Anemia in other chronic diseases classified elsewhere: Secondary | ICD-10-CM | POA: Diagnosis present

## 2022-07-13 DIAGNOSIS — Z888 Allergy status to other drugs, medicaments and biological substances status: Secondary | ICD-10-CM | POA: Diagnosis not present

## 2022-07-13 DIAGNOSIS — I2789 Other specified pulmonary heart diseases: Secondary | ICD-10-CM | POA: Diagnosis present

## 2022-07-13 DIAGNOSIS — Z66 Do not resuscitate: Secondary | ICD-10-CM | POA: Diagnosis present

## 2022-07-13 DIAGNOSIS — D869 Sarcoidosis, unspecified: Secondary | ICD-10-CM | POA: Diagnosis present

## 2022-07-13 DIAGNOSIS — K219 Gastro-esophageal reflux disease without esophagitis: Secondary | ICD-10-CM | POA: Diagnosis present

## 2022-07-13 DIAGNOSIS — I4891 Unspecified atrial fibrillation: Secondary | ICD-10-CM | POA: Diagnosis present

## 2022-07-13 DIAGNOSIS — E876 Hypokalemia: Secondary | ICD-10-CM | POA: Diagnosis not present

## 2022-07-13 DIAGNOSIS — E538 Deficiency of other specified B group vitamins: Secondary | ICD-10-CM | POA: Diagnosis present

## 2022-07-13 DIAGNOSIS — M7989 Other specified soft tissue disorders: Secondary | ICD-10-CM | POA: Diagnosis not present

## 2022-07-13 DIAGNOSIS — Z885 Allergy status to narcotic agent status: Secondary | ICD-10-CM | POA: Diagnosis not present

## 2022-07-13 DIAGNOSIS — F0390 Unspecified dementia without behavioral disturbance: Secondary | ICD-10-CM | POA: Diagnosis present

## 2022-07-13 DIAGNOSIS — Z882 Allergy status to sulfonamides status: Secondary | ICD-10-CM | POA: Diagnosis not present

## 2022-07-13 DIAGNOSIS — D860 Sarcoidosis of lung: Secondary | ICD-10-CM | POA: Diagnosis present

## 2022-07-13 DIAGNOSIS — I480 Paroxysmal atrial fibrillation: Secondary | ICD-10-CM | POA: Diagnosis present

## 2022-07-13 DIAGNOSIS — J449 Chronic obstructive pulmonary disease, unspecified: Secondary | ICD-10-CM | POA: Diagnosis present

## 2022-07-13 DIAGNOSIS — D509 Iron deficiency anemia, unspecified: Secondary | ICD-10-CM | POA: Diagnosis present

## 2022-07-13 DIAGNOSIS — I509 Heart failure, unspecified: Principal | ICD-10-CM

## 2022-07-13 DIAGNOSIS — Z79899 Other long term (current) drug therapy: Secondary | ICD-10-CM | POA: Diagnosis not present

## 2022-07-13 LAB — CBC WITH DIFFERENTIAL/PLATELET
Abs Immature Granulocytes: 0.06 10*3/uL (ref 0.00–0.07)
Basophils Absolute: 0 10*3/uL (ref 0.0–0.1)
Basophils Relative: 0 %
Eosinophils Absolute: 0.2 10*3/uL (ref 0.0–0.5)
Eosinophils Relative: 2 %
HCT: 30.7 % — ABNORMAL LOW (ref 36.0–46.0)
Hemoglobin: 9.3 g/dL — ABNORMAL LOW (ref 12.0–15.0)
Immature Granulocytes: 1 %
Lymphocytes Relative: 19 %
Lymphs Abs: 1.4 10*3/uL (ref 0.7–4.0)
MCH: 26.8 pg (ref 26.0–34.0)
MCHC: 30.3 g/dL (ref 30.0–36.0)
MCV: 88.5 fL (ref 80.0–100.0)
Monocytes Absolute: 0.6 10*3/uL (ref 0.1–1.0)
Monocytes Relative: 8 %
Neutro Abs: 5.2 10*3/uL (ref 1.7–7.7)
Neutrophils Relative %: 70 %
Platelets: 309 10*3/uL (ref 150–400)
RBC: 3.47 MIL/uL — ABNORMAL LOW (ref 3.87–5.11)
RDW: 14.7 % (ref 11.5–15.5)
WBC: 7.5 10*3/uL (ref 4.0–10.5)
nRBC: 0 % (ref 0.0–0.2)

## 2022-07-13 LAB — BASIC METABOLIC PANEL
Anion gap: 9 (ref 5–15)
BUN: 12 mg/dL (ref 8–23)
CO2: 22 mmol/L (ref 22–32)
Calcium: 8.5 mg/dL — ABNORMAL LOW (ref 8.9–10.3)
Chloride: 106 mmol/L (ref 98–111)
Creatinine, Ser: 0.73 mg/dL (ref 0.44–1.00)
GFR, Estimated: >60 mL/min (ref >60)
Glucose, Bld: 109 mg/dL — ABNORMAL HIGH (ref 70–99)
Potassium: 3.7 mmol/L (ref 3.5–5.1)
Sodium: 137 mmol/L (ref 135–145)

## 2022-07-13 LAB — BRAIN NATRIURETIC PEPTIDE: B Natriuretic Peptide: 322 pg/mL — ABNORMAL HIGH (ref 0.0–100.0)

## 2022-07-13 LAB — TROPONIN I (HIGH SENSITIVITY): Troponin I (High Sensitivity): 8 ng/L (ref <18)

## 2022-07-13 MED ORDER — FUROSEMIDE 10 MG/ML IJ SOLN
40.0000 mg | Freq: Two times a day (BID) | INTRAMUSCULAR | Status: DC
  Administered 2022-07-13 – 2022-07-14 (×2): 40 mg via INTRAVENOUS
  Filled 2022-07-13: qty 4

## 2022-07-13 MED ORDER — FUROSEMIDE 10 MG/ML IJ SOLN
40.0000 mg | Freq: Once | INTRAMUSCULAR | Status: AC
  Administered 2022-07-13: 40 mg via INTRAVENOUS
  Filled 2022-07-13: qty 4

## 2022-07-13 MED ORDER — UMECLIDINIUM BROMIDE 62.5 MCG/ACT IN AEPB
1.0000 | INHALATION_SPRAY | Freq: Every day | RESPIRATORY_TRACT | Status: DC
  Administered 2022-07-14 – 2022-07-19 (×6): 1 via RESPIRATORY_TRACT
  Filled 2022-07-13: qty 7

## 2022-07-13 MED ORDER — ONDANSETRON HCL 4 MG/2ML IJ SOLN: 4.0000 mg | Freq: Four times a day (QID) | INTRAMUSCULAR | Status: DC | PRN

## 2022-07-13 MED ORDER — PANTOPRAZOLE SODIUM 40 MG PO TBEC
40.0000 mg | DELAYED_RELEASE_TABLET | Freq: Every day | ORAL | Status: DC
  Administered 2022-07-14 – 2022-07-19 (×6): 40 mg via ORAL
  Filled 2022-07-13 (×6): qty 1

## 2022-07-13 MED ORDER — APIXABAN 2.5 MG PO TABS
2.5000 mg | ORAL_TABLET | Freq: Two times a day (BID) | ORAL | Status: DC
  Administered 2022-07-13 – 2022-07-19 (×12): 2.5 mg via ORAL
  Filled 2022-07-13 (×12): qty 1

## 2022-07-13 MED ORDER — POLYETHYLENE GLYCOL 3350 17 G PO PACK
17.0000 g | PACK | Freq: Every day | ORAL | Status: DC
  Administered 2022-07-14 – 2022-07-19 (×5): 17 g via ORAL
  Filled 2022-07-13 (×6): qty 1

## 2022-07-13 MED ORDER — SENNOSIDES-DOCUSATE SODIUM 8.6-50 MG PO TABS
1.0000 | ORAL_TABLET | Freq: Every evening | ORAL | Status: DC | PRN
  Administered 2022-07-16: 1 via ORAL
  Filled 2022-07-13: qty 1

## 2022-07-13 MED ORDER — MOMETASONE FURO-FORMOTEROL FUM 100-5 MCG/ACT IN AERO
2.0000 | INHALATION_SPRAY | Freq: Two times a day (BID) | RESPIRATORY_TRACT | Status: DC
  Administered 2022-07-13 – 2022-07-19 (×12): 2 via RESPIRATORY_TRACT
  Filled 2022-07-13: qty 8.8

## 2022-07-13 MED ORDER — LACTATED RINGERS IV SOLN: INTRAVENOUS | Status: DC

## 2022-07-13 MED ORDER — ALUM & MAG HYDROXIDE-SIMETH 200-200-20 MG/5ML PO SUSP
30.0000 mL | Freq: Once | ORAL | Status: AC
  Administered 2022-07-13: 30 mL via ORAL

## 2022-07-13 MED ORDER — ONDANSETRON HCL 4 MG PO TABS: 4.0000 mg | ORAL_TABLET | Freq: Four times a day (QID) | ORAL | Status: DC | PRN

## 2022-07-13 MED ORDER — BUDESON-GLYCOPYRROL-FORMOTEROL 160-9-4.8 MCG/ACT IN AERO: 2.0000 | INHALATION_SPRAY | Freq: Two times a day (BID) | RESPIRATORY_TRACT | Status: DC

## 2022-07-13 MED ORDER — ACETAMINOPHEN 650 MG RE SUPP: 650.0000 mg | Freq: Four times a day (QID) | RECTAL | Status: DC | PRN

## 2022-07-13 MED ORDER — LEVALBUTEROL HCL 0.63 MG/3ML IN NEBU: 0.6300 mg | INHALATION_SOLUTION | Freq: Four times a day (QID) | RESPIRATORY_TRACT | Status: DC | PRN

## 2022-07-13 MED ORDER — ACETAMINOPHEN 325 MG PO TABS
650.0000 mg | ORAL_TABLET | Freq: Four times a day (QID) | ORAL | Status: DC | PRN
  Administered 2022-07-14 – 2022-07-17 (×5): 650 mg via ORAL
  Filled 2022-07-13 (×6): qty 2

## 2022-07-13 MED ORDER — AMIODARONE HCL 100 MG PO TABS
100.0000 mg | ORAL_TABLET | Freq: Every day | ORAL | Status: DC
  Administered 2022-07-14 – 2022-07-19 (×6): 100 mg via ORAL
  Filled 2022-07-13 (×6): qty 1

## 2022-07-13 MED ORDER — SERTRALINE HCL 50 MG PO TABS
50.0000 mg | ORAL_TABLET | Freq: Every day | ORAL | Status: DC
  Administered 2022-07-13 – 2022-07-18 (×6): 50 mg via ORAL
  Filled 2022-07-13 (×6): qty 1

## 2022-07-13 NOTE — H&P (Signed)
History and Physical    Patient: [name] ZOX:[id number] DOB: [birth year] DOA: 07/13/2022 DOS: the patient was seen and examined on 07/13/2022 PCP: Joycelyn Man, NP  Patient coming from: ALF/ILF  Chief Complaint: SOB, BLE swelling   HPI: [name] is a 88 y.o. female with medical history significant for paroxysmal supraventricular tachycardia, A-fib, chronic diastolic HF, sarcoid, HTN, CAD presents with worsening dyspnea on exertion, bilateral lower extremity edema for the past couple of weeks.  Patient is currently in an assisted living facility [place].  Patient poor historian due to history of dementia.  Discussed extensively with daughter at bedside who assisted with the history.  Reports dissatisfaction with [facility] facility, due to several issues including not giving patient needed medication.  Patient was supposed to be getting Lasix on a daily basis as per daughter, but was not receiving it as well as amiodarone.  In the ED, vital signs fairly stable, chest x-ray showed pulmonary vascular congestion with some mild elevation of BNP.  Patient given 1 dose of Lasix.  [facility] hospitalist called to admit patient for further management.  Review of Systems:  Past Medical History:  Diagnosis Date   Acute maxillary sinusitis 10/17/2017   Acute posthemorrhagic anemia 08/22/2012   Acute renal failure syndrome (HCC) 01/04/2019   Allergy    SEASONAL   Anemia    Arthritis    Asthma    "slight"    Baker's cyst of knee, right    CAD (coronary artery disease)    a. Myoview 10/15 - normal EF 70% // Myoview 11/16: EF 75%, normal perfusion, low risk // c. LHC 3/17 - LAD irregs, oLCx 70 (neg FFR), mRCA 30, EF 55-65% >> med Rx // Myoview 07/2018:  EF 73, extracardiac uptake, no significant ischemia (reviewed with Dr. Excell Seltzer), Low Risk // Myoview 6/22: EF 72, normal perfusion, low risk   Carotid stenosis    a. Carotid US 9/15 - Bilateral 1-39% ICA >> FU 2 years  // b. Bilateral ICA 1-39 >> FU prn // Carotid US 06/2018: bilat 1-39; fu prn   Closed fracture of right olecranon process 05/09/2017   Closed left subtrochanteric femur fracture S/P Open/closed and reduction, internal medullary fixation  09/05/2012   Dyspnea    due to Sarcoidosis. When is windy or humed   Dysrhythmia    fast heart rate   Edema 10/30/2019   Elbow fracture 04/2017   Right, had surgery   Esophageal reflux    Fracture of inferior pubic ramus (HCC) 02/28/2019   Hematochezia    Hip fracture (HCC) 12/12/2018   History of echocardiogram    a. Echo 10/15 - EF 60-65%, no RWMA   HLD (hyperlipidemia)    Hx of colonoscopy    Hydronephrosis 07/29/2019   Left foot pain 09/02/2014   Assessment: 88 year old with acquired leg length discrepancy on the left who presents with 10 days of acute onset left foot pain. She seemed to be doing well prior to this pain with her orthotics that had been made to correct her leg length discrepancy. Negative x-rays at outside office which were again reviewed today.   This pain does not seem to impair her function and which has actually been im   Macular pucker, right eye    will have removed on 01/26/21   Osteoporosis    Other chronic pulmonary heart diseases    Paroxysmal supraventricular tachycardia    Pneumonia    Pneumonia 09/18/2019   Pre-op evaluation 06/11/2019  Pyelonephritis 07/29/2019   Sacral insufficiency fracture 12/12/2018   Sarcoidosis    Sepsis (HCC) 07/30/2019   Sepsis due to Escherichia coli (e. 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   COLONOSCOPY     CYSTOSCOPY W/ RETROGRADES Left 07/30/2019   Procedure: CYSTOSCOPY WITH RETROGRADE PYELOGRAM LEFT STENT PLACEMENT;  Surgeon: Crist Fat, MD;  Location: [facility] ORS;  Service: Urology;  Laterality: Left;   CYSTOSCOPY WITH RETROGRADE PYELOGRAM, URETEROSCOPY AND STENT PLACEMENT Left 08/20/2019   Procedure: CYSTOSCOPY, URETEROSCOPY AND STENT PLACEMENT;  Surgeon: Noel Christmas, MD;  Location: [facility] OR[facility];  Service: Urology;  Laterality: Left;  1 HR   FOOT SURGERY Right    HIP SURGERY Right 2011   full replacement   HOLMIUM LASER APPLICATION Left 08/20/2019   Procedure: HOLMIUM LASER APPLICATION;  Surgeon: Noel Christmas, MD;  Location: [facility];  Service: Urology;  Laterality: Left;   LEFT HEART CATH AND CORONARY ANGIOGRAPHY N/A 08/09/2021   Procedure: LEFT HEART CATH AND CORONARY ANGIOGRAPHY;  Surgeon: Tonny Bollman, MD;  Location: [facility] INVASIVE CV LAB;  Service: Cardiovascular;  Laterality: N/A;   LEG SURGERY Left 06/2012   femur fracture s/p open and closed reduction in [place], Dr. Mayford Knife   lens replacement Right    right eye   ORIF ELBOW FRACTURE Right 05/09/2017   Procedure: ORIF right olecranon fracture with repair/reconstruction, ulnar nerve transposition as needed;  Surgeon: Dominica Severin, MD;  Location: [facility] OR;  Service: Orthopedics;  Laterality: Right;  Requests 90 mins   pelvis fracture     POLYPECTOMY     REVERSE SHOULDER ARTHROPLASTY Left 06/13/2019   Procedure: REVERSE SHOULDER ARTHROPLASTY;  Surgeon: Francena Hanly, MD;  Location: [facility];  Service: Orthopedics;  Laterality: Left;    SHOULDER ARTHROSCOPY W/ ROTATOR CUFF REPAIR Right    TRAPEZIUM RESECTION Right    Social History:  reports that she has never smoked. She has never used smokeless tobacco. She reports that she does not drink alcohol and does not use drugs.  Allergies  Allergen Reactions   Levaquin [Levofloxacin] Other (See Comments)    Joint pain..  Per doctor, told to not to take again and "allergic," per [facility]   Oxycodone Nausea And Vomiting and Other (See Comments)    Can take oxycodone with Zofran (otherwise, GI Intolerance)   Oxycodone-Acetaminophen Other (See Comments)    GI Intolerance and "talking out of my head" and "allergic," per [facility]   Sulfonamide Derivatives Other (See Comments)    Possibly caused hepatitis and "allergic," per [facility]   Sulfa Antibiotics Other (See Comments)    Possibly caused hepatitis and "allergic," per [facility]   Morphine Nausea Only and Other (See Comments)    GI intolerance and "allergic," per [facility]    Family History  Problem Relation Age of Onset   Colon cancer Mother 56   Cancer Mother    Lung cancer Father    Heart disease Father    Arrhythmia Father    Cancer Father    Cancer Sister  ovarian   Heart attack Brother    Esophageal cancer Neg Hx    Rectal cancer Neg Hx    Stomach cancer Neg Hx    Stroke Neg Hx     Prior to Admission medications   Medication Sig Start Date End Date Taking? Authorizing Provider  acetaminophen (TYLENOL) 500 MG tablet Take 1,000 mg by mouth every 6 (six) hours as needed (for pain).   Yes [provider]  amiodarone (PACERONE) 100 MG tablet Take 1 tablet (100 mg total) by mouth daily. 07/12/22  Yes Conte, Tessa N, PA-C  Budeson-Glycopyrrol-Formoterol (BREZTRI AEROSPHERE) 160-9-4.8 MCG/ACT AERO Inhale 2 puffs into the lungs in the morning and at bedtime. 09/29/21  Yes Leslye Peer, MD  Calcium Carbonate Antacid (TUMS ULTRA 1000 PO) Take 1,000 mg by mouth every 12 (twelve) hours as needed (acid reflux/indigestion/heartburn).   Yes [provider]  ELIQUIS 2.5 MG TABS tablet TAKE 1 TABLET(2.5 MG) BY MOUTH TWICE DAILY Patient taking differently: Take 2.5 mg by mouth in the morning and at bedtime. 01/31/22  Yes Tonny Bollman, MD  furosemide (LASIX) 20 MG tablet Take 1 tablet by mouth daily for 5 days, then take 20mg  by mouth daily Patient taking  differently: Take 20 mg by mouth daily as needed for fluid. 06/02/22 06/02/23 Yes Conte, Tessa N, PA-C  guaiFENesin (MUCINEX) 600 MG 12 hr tablet Take 2 tablets (1,200 mg total) by mouth 2 (two) times daily. 03/18/22  Yes Glade Lloyd, MD  ipratropium-albuterol (DUONEB) 0.5-2.5 (3) MG/3ML SOLN Take 3 mLs by nebulization every 6 (six) hours as needed (wheezing; shortness of breath). 10/22/21  Yes Cobb, Ruby Cola, NP  omeprazole (PRILOSEC) 40 MG capsule Take 40 mg by mouth in the morning and at bedtime.   Yes [provider]  polyethylene glycol (MIRALAX / GLYCOLAX) 17 g packet Take 17 g by mouth daily as needed for mild constipation.   Yes [provider]  sertraline (ZOLOFT) 50 MG tablet Take 1 tablet (50 mg total) by mouth at bedtime. 12/23/21  Yes Anson Fret, MD    Physical Exam: Vitals:   07/13/22 1422 07/13/22 1837  BP: (!) 137/55 (!) 152/67  Pulse: 64 67  Resp: 19 18  Temp: 97.7 F (36.5 C) 97.9 F (36.6 C)  TempSrc: Oral Oral  SpO2: 100% 100%   General: NAD  Cardiovascular: S1, S2 present Respiratory: Mild bibasilar crackles noted Abdomen: Soft, nontender, nondistended, bowel sounds present Musculoskeletal: 2+ bilateral pedal edema noted Skin: Normal Psychiatry: Normal mood    Data Reviewed: As noted above  Assessment and Plan:  Acute on chronic diastolic HF BNP 332 Initial troponin 8, repeat pending EKG with no acute ST changes Chest x-ray with pulmonary vascular congestion Echo done 4/24 showed EF of 55%, no regional wall motion abnormality, left ventricular diastolic parameters are indeterminate, pulmonary artery systolic pressure 30 mmHg Start IV Lasix Strict I's and O's, daily weights  History of COPD History of sarcoid No noted wheezing or oxygen requirement Continue nebs, inhaler  History of dysphagia Currently on dysphagia type II diet Continue, if any signs of aspiration will consult SLP  History of paroxysmal A-fib EKG showing  normal sinus rhythm, rate controlled Continue amiodarone, Eliquis  Hypertension BP stable  Anemia of chronic disease Hemoglobin 9.3 on admission, baseline around 11-12 Anemia panel pending Daily CBC      Advance Care Planning:   Code Status: DNR   Consults: None  Family Communication: Discussed extensively with daughter at bedside  Severity of Illness: The appropriate patient status for this patient is INPATIENT. Inpatient status is judged to be reasonable and necessary in order to provide the required intensity of service to ensure the patient's safety. The patient's presenting symptoms, physical exam findings, and initial radiographic and laboratory data in the context of their chronic comorbidities is felt to place them at high risk for further clinical deterioration. Furthermore, it is not anticipated that the patient will be medically stable for discharge from the hospital within 2 midnights of admission.   * I certify that at the point of admission it is my clinical judgment that the patient will require inpatient hospital care spanning beyond 2 midnights from the point of admission due to high intensity of service, high risk for further deterioration and high frequency of surveillance required.*  Author: Briant Cedar, MD 07/13/2022 7:40 PM  For on call review [email or web address].

## 2022-07-13 NOTE — ED Provider Notes (Signed)
[name]   CSN: [id number] Arrival date & time: 07/13/22  1408     History  No chief complaint on file.   [name] is a 88 y.o. female.  This is a 88 year old female who presents with bilateral lower extremity edema as well as increased dyspnea on exertion.  Patient is supposed to be on Lasix but has been off of it for about 2 weeks.  She has a daughter at the bedside who gives the history.  Patient does have a history of dementia.  She also history of sarcoid as well as A-fib.  Daughter states she has had increasing dyspnea on exertion but no reported fever, cough.  Did receive 1 dose of Lasix 20 mg prior to coming here today.       Home Medications Prior to Admission medications   Medication Sig Start Date End Date Taking? Authorizing Provider  acetaminophen (TYLENOL) 500 MG tablet Take 1,000 mg by mouth every 6 (six) hours as needed for mild pain.     [provider]  amiodarone (PACERONE) 100 MG tablet Take 1 tablet (100 mg total) by mouth daily. 07/12/22   Sharlene Dory, PA-C  Budeson-Glycopyrrol-Formoterol (BREZTRI AEROSPHERE) 160-9-4.8 MCG/ACT AERO Inhale 2 puffs into the lungs in the morning and at bedtime. 09/29/21   Leslye Peer, MD  Calcium Carbonate Antacid (TUMS ULTRA 1000 PO) Take 2,000-3,000 mg by mouth at bedtime as needed (acid reflux/indigestion/heartburn).    [provider]  ciprofloxacin (CIPRO) 500 MG tablet Take 500 mg by mouth every 8 (eight) hours as needed.    [provider]  ELIQUIS 2.5 MG TABS tablet TAKE 1 TABLET(2.5 MG) BY MOUTH TWICE DAILY 01/31/22   Tonny Bollman, MD  furosemide (LASIX) 20 MG tablet Take 1 tablet by mouth daily for 5 days, then take 20mg  by mouth daily 06/02/22 06/02/23  Sharlene Dory, PA-C  guaiFENesin (MUCINEX) 600 MG 12 hr tablet Take 2 tablets (1,200 mg total) by mouth 2 (two) times daily. 03/18/22   Glade Lloyd, MD   ipratropium-albuterol (DUONEB) 0.5-2.5 (3) MG/3ML SOLN Take 3 mLs by nebulization every 6 (six) hours as needed (wheezing; shortness of breath). 10/22/21   Cobb, Ruby Cola, NP  omeprazole (PRILOSEC) 40 MG capsule Take 40 mg by mouth in the morning and at bedtime.    [provider]  polyethylene glycol (MIRALAX / GLYCOLAX) 17 g packet Take 17 g by mouth daily.    [provider]  sertraline (ZOLOFT) 50 MG tablet Take 1 tablet (50 mg total) by mouth at bedtime. 12/23/21   Anson Fret, MD      Allergies    Levaquin [levofloxacin], Oxycodone, Sulfonamide derivatives, Sulfa antibiotics, and Morphine    Review of Systems   Review of Systems  All other systems reviewed and are negative.   Physical Exam Updated Vital Signs BP (!) 137/55 (BP Location: Right Arm)   Pulse 64   Temp 97.7 F (36.5 C) (Oral)   Resp 19   LMP 02/22/1983 (Approximate)   SpO2 100%  Physical Exam Vitals and nursing Mcgrath reviewed.  Constitutional:      General: She is not in acute distress.    Appearance: Normal appearance. She is well-developed. She is not toxic-appearing.  HENT:     Head: Normocephalic and atraumatic.  Eyes:     General: Lids are normal.     Conjunctiva/sclera: Conjunctivae normal.     Pupils: Pupils  are equal, round, and reactive to light.  Neck:     Thyroid: No thyroid mass.     Trachea: No tracheal deviation.  Cardiovascular:     Rate and Rhythm: Normal rate and regular rhythm.     Heart sounds: Normal heart sounds. No murmur heard.    No gallop.  Pulmonary:     Effort: Pulmonary effort is normal. Prolonged expiration present. No respiratory distress.     Breath sounds: Decreased air movement present. No stridor. Examination of the right-upper field reveals decreased breath sounds. Examination of the left-upper field reveals decreased breath sounds. Decreased breath sounds present. No wheezing, rhonchi or rales.  Abdominal:     General: There is no distension.      Palpations: Abdomen is soft.     Tenderness: There is no abdominal tenderness. There is no rebound.  Musculoskeletal:        General: No tenderness. Normal range of motion.     Cervical back: Normal range of motion and neck supple.  Lymphadenopathy:     Comments: 3+ bilateral pitting edema 88 year old female who presents  Skin:    General: Skin is warm and dry.     Findings: No abrasion or rash.  Neurological:     Mental Status: She is alert and oriented to person, place, and time. Mental status is at baseline.     GCS: GCS eye subscore is 4. GCS verbal subscore is 5. GCS motor subscore is 6.     Cranial Nerves: No cranial nerve deficit.     Sensory: No sensory deficit.     Motor: Motor function is intact.  Psychiatric:        Attention and Perception: Attention normal.        Speech: Speech normal.        Behavior: Behavior normal.     ED Results / Procedures / Treatments   Labs (all labs ordered are listed, but only abnormal results are displayed) Labs Reviewed  CBC WITH DIFFERENTIAL/PLATELET - Abnormal; Notable for the following components:      Result Value   RBC 3.47 (*)    Hemoglobin 9.3 (*)    HCT 30.7 (*)    All other components within normal limits  BASIC METABOLIC PANEL  BRAIN NATRIURETIC PEPTIDE  TROPONIN I (HIGH SENSITIVITY)    EKG None  Radiology No results found.  Procedures Procedures    Medications Ordered in ED Medications  lactated ringers infusion ( Intravenous New Bag/Given 07/13/22 1502)    ED Course/ Medical Decision Making/ A&P                             Medical Decision Making Amount and/or Complexity of Data Reviewed Labs: ordered. Radiology: ordered. ECG/medicine tests: ordered.  Risk Prescription drug management.   Patient's chest x-ray shows pulmonary vascular congestion.  BNP is elevated as well to.  Concern for CHF.  Patient started on Lasix 40 mg IV push.  Will admit to the hospitalist service.  Discussed with  daughter who is at bedside and she is in agreement        Final Clinical Impression(s) / ED Diagnoses Final diagnoses:  None    Rx / DC Orders ED Discharge Orders     None         Lorre Nick, MD 07/13/22 1623

## 2022-07-13 NOTE — ED Notes (Signed)
ED TO INPATIENT HANDOFF REPORT  Name/Age/Gender [name] 88 y.o. female  Code Status Code Status History     Date Active Date Inactive Code Status Order ID Comments User Context   04/24/2022 1520 04/29/2022 1948 DNR [id number]  Alena Bills, DO Inpatient   04/18/2022 2126 04/24/2022 1519 DNR [id number]  Anselm Jungling, DO ED   03/09/2022 1835 03/19/2022 0028 DNR [id number]  Synetta Fail, MD ED   08/07/2021 1827 08/10/2021 2031 Full Code [id number]  Synetta Fail, MD ED   09/18/2019 1840 09/26/2019 2341 Full Code [id number]  Anselm Jungling, DO ED   07/30/2019 0005 08/01/2019 2158 Full Code [id number]  Eduard Clos, MD ED   07/18/2019 1854 07/24/2019 1656 Full Code [id number]  Charlsie Quest, MD ED   06/13/2019 1310 06/14/2019 2145 Full Code [id number]  Yevonne Pax Inpatient   12/12/2018 1744 12/15/2018 1920 Full Code [id number]  Rhetta Mura, MD ED   05/09/2017 1950 05/10/2017 1643 Full Code [id number]  Karie Chimera, PA-C Inpatient   05/20/2015 1444 05/20/2015 2231 Full Code [id number]  Tonny Bollman, MD Inpatient    Questions for Most Recent Historical Code Status (Order [id number])     Question Answer   If patient has no pulse and is not breathing Do Not Attempt Resuscitation   If patient has a pulse and/or is breathing: Medical Treatment Goals COMFORT MEASURES: Keep clean/warm/dry, use medication by any route; positioning, wound care and other measures to relieve pain/suffering; use oxygen, suction/manual treatment of airway obstruction for comfort; do not transfer unless for comfort needs.   Consent: Discussion documented in EHR or advanced directives reviewed            Home/SNF/Other Nursing Home  Chief Complaint Acute on chronic diastolic (congestive) heart failure (HCC) [I50.33]  Level of Care/Admitting Diagnosis ED Disposition     ED Disposition  Admit   Condition  --   Comment  Hospital Area: [facility] [[id number]]  Level of Care:  Telemetry [5]  Admit to tele based on following criteria: Acute CHF  May admit patient to [facility] or [facility] if equivalent level of care is available:: No  Covid Evaluation: Asymptomatic - no recent exposure (last 10 days) testing not required  Diagnosis: Acute on chronic diastolic (congestive) heart failure [facility]) [9381829]  Admitting Physician: Briant Cedar [[id number]]  Attending Physician: Briant Cedar [[id number]]  Certification:: I certify this patient will need inpatient services for at least 2 midnights  Estimated Length of Stay: 2          Medical History Past Medical History:  Diagnosis Date   Acute maxillary sinusitis 10/17/2017   Acute posthemorrhagic anemia 08/22/2012   Acute renal failure syndrome (HCC) 01/04/2019   Allergy    SEASONAL   Anemia    Arthritis    Asthma    "slight"    Baker's cyst of knee, right    CAD (coronary artery disease)    a. Myoview 10/15 - normal EF 70% // Myoview 11/16: EF 75%, normal perfusion, low risk // c. LHC 3/17 - LAD irregs, oLCx 70 (neg FFR), mRCA 30, EF 55-65% >> med Rx // Myoview 07/2018:  EF 73, extracardiac uptake, no significant ischemia (reviewed with Dr. Excell Seltzer), Low Risk // Myoview 6/22: EF 72, normal perfusion, low risk   Carotid stenosis    a. Carotid US 9/15 - Bilateral 1-39% ICA >> FU 2 years // b. Bilateral ICA 1-39 >> FU prn //  Carotid US 06/2018: bilat 1-39; fu prn   Closed fracture of right olecranon process 05/09/2017   Closed left subtrochanteric femur fracture S/P Open/closed and reduction, internal medullary fixation  09/05/2012   Dyspnea    due to Sarcoidosis. When is windy or humed   Dysrhythmia    fast heart rate   Edema 10/30/2019   Elbow fracture 04/2017   Right, had surgery   Esophageal reflux    Fracture of inferior pubic ramus (HCC) 02/28/2019   Hematochezia    Hip fracture (HCC) 12/12/2018   History of echocardiogram    a. Echo 10/15 - EF 60-65%, no RWMA   HLD (hyperlipidemia)     Hx of colonoscopy    Hydronephrosis 07/29/2019   Left foot pain 09/02/2014   Assessment: 88 year old with acquired leg length discrepancy on the left who presents with 10 days of acute onset left foot pain. She seemed to be doing well prior to this pain with her orthotics that had been made to correct her leg length discrepancy. Negative x-rays at outside office which were again reviewed today.   This pain does not seem to impair her function and which has actually been im   Macular pucker, right eye    will have removed on 01/26/21   Osteoporosis    Other chronic pulmonary heart diseases    Paroxysmal supraventricular tachycardia    Pneumonia    Pneumonia 09/18/2019   Pre-op evaluation 06/11/2019   Pyelonephritis 07/29/2019   Sacral insufficiency fracture 12/12/2018   Sarcoidosis    Sepsis (HCC) 07/30/2019   Sepsis due to Escherichia coli (e. coli) (HCC) 07/29/2019   Sepsis due to urinary tract infection (HCC) 07/18/2019    Allergies Allergies  Allergen Reactions   Levaquin [Levofloxacin] Other (See Comments)    Joint pain.. Per doctor not to take again   Oxycodone Nausea Only    Can take oxycodone with Zofran Other reaction(s): vomiting   Sulfonamide Derivatives Other (See Comments)    Possibly caused hepatitis   Sulfa Antibiotics Other (See Comments)    Possibly caused hepatitis   Morphine Nausea Only    IV Location/Drains/Wounds Patient Lines/Drains/Airways Status     Active Line/Drains/Airways     Name Placement date Placement time Site Days   Peripheral IV 07/13/22 20 G Right Antecubital 07/13/22  1456  Antecubital  less than 1   Urethral Catheter D.Dark RN Straight-tip 16 Fr. 04/22/22  1815  Straight-tip  82   Ureteral Drain/Stent Left ureter 6 Fr. 08/20/19  0947  Left ureter  1058   Pressure Injury 04/19/22 Sacrum Medial Stage 2 -  Partial thickness loss of dermis presenting as a shallow open injury with a red, pink wound bed without slough. 04/19/22  1630  -- 85             Labs/Imaging Results for orders placed or performed during the hospital encounter of 07/13/22 (from the past 48 hour(s))  CBC with Differential/Platelet     Status: Abnormal   Collection Time: 07/13/22  2:40 PM  Result Value Ref Range   WBC 7.5 4.0 - 10.5 K/uL   RBC 3.47 (L) 3.87 - 5.11 MIL/uL   Hemoglobin 9.3 (L) 12.0 - 15.0 g/dL   HCT 16.1 (L) 09.6 - 04.5 %   MCV 88.5 80.0 - 100.0 fL   MCH 26.8 26.0 - 34.0 pg   MCHC 30.3 30.0 - 36.0 g/dL   RDW 40.9 81.1 - 91.4 %   Platelets 309 150 - 400  K/uL   nRBC 0.0 0.0 - 0.2 %   Neutrophils Relative % 70 %   Neutro Abs 5.2 1.7 - 7.7 K/uL   Lymphocytes Relative 19 %   Lymphs Abs 1.4 0.7 - 4.0 K/uL   Monocytes Relative 8 %   Monocytes Absolute 0.6 0.1 - 1.0 K/uL   Eosinophils Relative 2 %   Eosinophils Absolute 0.2 0.0 - 0.5 K/uL   Basophils Relative 0 %   Basophils Absolute 0.0 0.0 - 0.1 K/uL   Immature Granulocytes 1 %   Abs Immature Granulocytes 0.06 0.00 - 0.07 K/uL    Comment: Performed at [facility], [address], [place], [place] [address]  Basic metabolic panel     Status: Abnormal   Collection Time: 07/13/22  2:40 PM  Result Value Ref Range   Sodium 137 135 - 145 mmol/L   Potassium 3.7 3.5 - 5.1 mmol/L   Chloride 106 98 - 111 mmol/L   CO2 22 22 - 32 mmol/L   Glucose, Bld 109 (H) 70 - 99 mg/dL    Comment: Glucose reference range applies only to samples taken after fasting for at least 8 hours.   BUN 12 8 - 23 mg/dL   Creatinine, Ser 6.04 0.44 - 1.00 mg/dL   Calcium 8.5 (L) 8.9 - 10.3 mg/dL   GFR, Estimated >54 >09 mL/min    Comment: (NOTE) Calculated using the CKD-EPI Creatinine Equation (2021)    Anion gap 9 5 - 15    Comment: Performed at [facility], [address], [place], [place] [address]  Brain natriuretic peptide     Status: Abnormal   Collection Time: 07/13/22  3:00 PM  Result Value Ref Range   B Natriuretic Peptide 322.0 (H) 0.0 - 100.0 pg/mL     Comment: Performed at [facility], [address], [place], [place] [address]   DG Chest 2 View  Result Date: 07/13/2022 CLINICAL DATA:  Short of breath, bilateral lower extremity edema, chest tightness EXAM: CHEST - 2 VIEW COMPARISON:  09/20/2022 FINDINGS: Frontal and lateral views of the chest demonstrate stable enlargement of the cardiac silhouette. The lungs are hyperinflated. There is increased pulmonary vascular congestion, with small bilateral pleural effusions. No acute airspace disease or pneumothorax. Stable left shoulder arthroplasty. IMPRESSION: 1. Enlarged cardiac silhouette, with increased pulmonary vascular congestion. No overt edema. 2. Small bilateral pleural effusions. 3. Hyperinflation of the lungs. Electronically Signed   By: Sharlet Salina M.D.   On: 07/13/2022 15:58    Pending Labs [employer] (From admission, onward)     Start     Ordered   Signed and Held  Comprehensive metabolic panel  Tomorrow morning,   R        Signed and Held   Signed and Held  CBC  Tomorrow morning,   R        Signed and Held            Vitals/Pain Today's Vitals   07/13/22 1422  BP: (!) 137/55  Pulse: 64  Resp: 19  Temp: 97.7 F (36.5 C)  TempSrc: Oral  SpO2: 100%  PainSc: 0-No pain    Isolation Precautions No active isolations  Medications Medications  furosemide (LASIX) injection 40 mg (has no administration in time range)  levalbuterol (XOPENEX) nebulizer solution 0.63 mg (has no administration in time range)  amiodarone (PACERONE) tablet 100 mg (has no administration in time range)  Budeson-Glycopyrrol-Formoterol 160-9-4.8 MCG/ACT AERO 2 puff (has  no administration in time range)  apixaban (ELIQUIS) tablet 2.5 mg (has no administration in time range)  pantoprazole (PROTONIX) EC tablet 40 mg (has no administration in time range)  polyethylene glycol (MIRALAX / GLYCOLAX) packet 17 g (has no administration in time range)  sertraline (ZOLOFT) tablet  50 mg (has no administration in time range)  furosemide (LASIX) injection 40 mg (40 mg Intravenous Given 07/13/22 1721)    Mobility manual wheelchair

## 2022-07-13 NOTE — ED Triage Notes (Signed)
Patient presents with bilateral lower leg edema that started this past Thursday or Friday. She was given a PRN dose of Lasix today. She also complains of chest tightness with palpation.    Hx: Sarcoidosis, A Fib   EMS vitals: 130/60 BP 64 HR 18 RR 98% SPO2 on room air

## 2022-07-14 ENCOUNTER — Inpatient Hospital Stay (HOSPITAL_COMMUNITY): Payer: Medicare Other

## 2022-07-14 DIAGNOSIS — M7989 Other specified soft tissue disorders: Secondary | ICD-10-CM | POA: Diagnosis not present

## 2022-07-14 DIAGNOSIS — I5033 Acute on chronic diastolic (congestive) heart failure: Secondary | ICD-10-CM | POA: Diagnosis not present

## 2022-07-14 LAB — CBC
HCT: 28.5 % — ABNORMAL LOW (ref 36.0–46.0)
Hemoglobin: 9.1 g/dL — ABNORMAL LOW (ref 12.0–15.0)
MCH: 28.1 pg (ref 26.0–34.0)
MCHC: 31.9 g/dL (ref 30.0–36.0)
MCV: 88 fL (ref 80.0–100.0)
Platelets: 314 10*3/uL (ref 150–400)
RBC: 3.24 MIL/uL — ABNORMAL LOW (ref 3.87–5.11)
RDW: 14.6 % (ref 11.5–15.5)
WBC: 6.2 10*3/uL (ref 4.0–10.5)
nRBC: 0 % (ref 0.0–0.2)

## 2022-07-14 LAB — COMPREHENSIVE METABOLIC PANEL
ALT: 12 U/L (ref 0–44)
AST: 16 U/L (ref 15–41)
Albumin: 3.1 g/dL — ABNORMAL LOW (ref 3.5–5.0)
Alkaline Phosphatase: 67 U/L (ref 38–126)
Anion gap: 10 (ref 5–15)
BUN: 13 mg/dL (ref 8–23)
CO2: 26 mmol/L (ref 22–32)
Calcium: 8.2 mg/dL — ABNORMAL LOW (ref 8.9–10.3)
Chloride: 101 mmol/L (ref 98–111)
Creatinine, Ser: 0.81 mg/dL (ref 0.44–1.00)
GFR, Estimated: >60 mL/min (ref >60)
Glucose, Bld: 99 mg/dL (ref 70–99)
Potassium: 3.3 mmol/L — ABNORMAL LOW (ref 3.5–5.1)
Sodium: 137 mmol/L (ref 135–145)
Total Bilirubin: 0.5 mg/dL (ref 0.3–1.2)
Total Protein: 6 g/dL — ABNORMAL LOW (ref 6.5–8.1)

## 2022-07-14 LAB — IRON AND TIBC
Iron: 15 ug/dL — ABNORMAL LOW (ref 28–170)
Saturation Ratios: 5 % — ABNORMAL LOW (ref 10.4–31.8)
TIBC: 291 ug/dL (ref 250–450)
UIBC: 276 ug/dL

## 2022-07-14 LAB — FERRITIN: Ferritin: 30 ng/mL (ref 11–307)

## 2022-07-14 LAB — FOLATE: Folate: 7.7 ng/mL (ref >5.9)

## 2022-07-14 LAB — MAGNESIUM: Magnesium: 1.7 mg/dL (ref 1.7–2.4)

## 2022-07-14 LAB — VITAMIN B12: Vitamin B-12: 202 pg/mL (ref 180–914)

## 2022-07-14 MED ORDER — POTASSIUM CHLORIDE CRYS ER 20 MEQ PO TBCR
40.0000 meq | EXTENDED_RELEASE_TABLET | Freq: Once | ORAL | Status: AC
  Administered 2022-07-14: 40 meq via ORAL
  Filled 2022-07-14: qty 2

## 2022-07-14 MED ORDER — SODIUM CHLORIDE 0.9 % IV SOLN
250.0000 mg | Freq: Every day | INTRAVENOUS | Status: AC
  Administered 2022-07-14 – 2022-07-17 (×4): 250 mg via INTRAVENOUS
  Filled 2022-07-14 (×4): qty 20

## 2022-07-14 MED ORDER — CYANOCOBALAMIN 1000 MCG/ML IJ SOLN
1000.0000 ug | Freq: Once | INTRAMUSCULAR | Status: AC
  Administered 2022-07-14: 1000 ug via SUBCUTANEOUS
  Filled 2022-07-14: qty 1

## 2022-07-14 MED ORDER — FUROSEMIDE 10 MG/ML IJ SOLN
20.0000 mg | Freq: Two times a day (BID) | INTRAMUSCULAR | Status: DC
  Administered 2022-07-14 – 2022-07-15 (×2): 20 mg via INTRAVENOUS
  Filled 2022-07-14 (×2): qty 2

## 2022-07-14 MED ORDER — VITAMIN B-12 1000 MCG PO TABS
1000.0000 ug | ORAL_TABLET | Freq: Every day | ORAL | Status: DC
  Administered 2022-07-15 – 2022-07-19 (×5): 1000 ug via ORAL
  Filled 2022-07-14 (×5): qty 1

## 2022-07-14 MED ORDER — VITAMIN B-12 1000 MCG PO TABS: 1000.0000 ug | ORAL_TABLET | Freq: Every day | ORAL | Status: DC

## 2022-07-14 NOTE — Consult Note (Addendum)
WOC Nurse Consult Note: Reason for Consult: sacral skin breakdown  Wound type:1.  Deep tissue Pressure Injury bilateral buttocks/sacrum total area 6 cm x 6 cm  2.  Stage 1 Pressure Injury R medial heel   Pressure Injury POA: Yes Measurement: 1.  R buttock 3 cm x 2 cm Deep Tissue Pressure Injury purple maroon discoloration w/skin intact  2. L buttock 2 cm x 3 cm Deep Tissue Pressure Injury purple maroon discoloration, evolving with 0.5 cm x 0.5 cm area of partial thickness skin loss 100% pink and moist  3.  R heel 1 cm x 1 cm Stage 1 Pressure Injury   Drainage (amount, consistency, odor) none noted  Periwound: peeling skin R heel, intact around Deep Tissue Pressure Injury  Dressing procedure/placement/frequency:  Clean buttocks/sacrum with soap and water, dry and place a piece of Xeroform gauze [employer] [place] [phone]) to total area of purple maroon discoloration daily.  Cover with silicone foam. May lift foam to reapply Xeroform gauze daily. Change foam dressing q3 days and prn soiling.  2.  R heel cover with silicone foam dressing.  Lift foam dressing daily to reassess heel. Place R foot in Prevalon boot [employer] [place] [phone]) to offload pressure.  Patient should remain on low air loss mattress throughout hospitalization for pressure redistribution and moisture management.  Prevalon boot to R foot.  Also ordered pressure redistribution chair pad  [employer] [place] [phone]) for when up in chair. Daughter states patient spends a significant amount of time daily up in wheelchair.    Discussed with daughter the nature of Deep Tissue Pressure Injuries, that these are high risk for deterioration into an open wound.  POC discussed with patient, daughter and bedside nurse.    WOC team will not follow at this time.  Re-consult if further needs arise.   Thank you,     Priscella Mann MSN, RN-BC, [employer] [phone]

## 2022-07-14 NOTE — Telephone Encounter (Signed)
Tried to fax again to numbers [phone] and to [phone].

## 2022-07-14 NOTE — TOC Initial Note (Signed)
Transition of Care [facility]) - Initial/Assessment Note    Patient Details  Name: [name] MRN: [id number] Date of Birth: 17-Sep-1934  [facility] of [facility]) CM/SW Contact:    Larrie Kass, LCSW Phone Number: 07/14/2022, 10:43 AM  Clinical Narrative:                 CSW spoke with pt's daughter Braulio Conte, she reports pt is from [facility]. Ms. Zonia Kief reports being told by MD that the patient may need to go back to SNF placement and does not understand why the patient was discharged and transferred to [facility]. Pt's daughter reports she will call CSW back to discuss d/c plans at a later time. There is no PT eval rec as of now. CSW will await rec for this hospitalization TOC to follow.   Expected Discharge Plan:  (TBD) Barriers to Discharge: Continued Medical Work up   Patient Goals and CMS Choice            Expected Discharge Plan and Services       Living arrangements for the past 2 months: Assisted Living Facility                                      Prior Living Arrangements/Services Living arrangements for the past 2 months: Assisted Living Facility Lives with:: Self Patient language and need for interpreter reviewed:: Yes        Need for Family Participation in Patient Care: Yes (Comment) Care giver support system in place?: Yes (comment)   Criminal Activity/Legal Involvement Pertinent to Current Situation/Hospitalization: No - Comment as needed  Activities of Daily Living Home Assistive Devices/Equipment: Walker (specify type) ADL Screening (condition at time of admission) Patient's cognitive ability adequate to safely complete daily activities?: No Is the patient deaf or have difficulty hearing?: No Does the patient have difficulty seeing, even when wearing glasses/contacts?: No Does the patient have difficulty concentrating, remembering, or making decisions?: No Patient able to express need for assistance with  ADLs?: Yes Does the patient have difficulty dressing or bathing?: Yes Independently performs ADLs?: No Communication: Independent Dressing (OT): Independent Grooming: Independent Feeding: Independent Bathing: Needs assistance Is this a change from baseline?: Pre-admission baseline Toileting: Needs assistance Is this a change from baseline?: Pre-admission baseline In/Out Bed: Independent Walks in Home: Needs assistance Is this a change from baseline?: Pre-admission baseline Does the patient have difficulty walking or climbing stairs?: Yes Weakness of Legs: Both Weakness of Arms/Hands: None  Permission Sought/Granted                  Emotional Assessment           Psych Involvement: No (comment)  Admission diagnosis:  Acute on chronic diastolic (congestive) heart failure (HCC) [I50.33] Congestive heart failure, unspecified HF chronicity, unspecified heart failure type (HCC) [I50.9] Patient Active Problem List   Diagnosis Date Noted   Acute on chronic diastolic (congestive) heart failure (HCC) 07/13/2022   Pressure injury of skin 04/27/2022   Hypokalemia 04/27/2022   Pleural effusion on left 04/24/2022   Need for emotional support 04/24/2022   High risk medication use 04/24/2022   Pain 04/24/2022   DNR (do not resuscitate) 04/24/2022   End of life care 04/24/2022   Dyspnea 04/24/2022   Palliative care encounter 04/21/2022   Goals of care, counseling/discussion 04/21/2022   Counseling and coordination of care 04/21/2022  Malnutrition of moderate degree 04/20/2022   Atrial fibrillation with RVR (HCC) 04/18/2022   Hypotension 04/18/2022   Failure to thrive in adult 04/18/2022   Acute urinary retention 04/18/2022   RSV (respiratory syncytial virus pneumonia) 03/11/2022   COPD exacerbation (HCC) 03/09/2022   Paroxysmal atrial fibrillation with RVR (HCC) 03/09/2022   Acute respiratory failure with hypoxia (HCC) 03/09/2022   Bronchiectasis with acute exacerbation  (HCC) 03/04/2022   URI (upper respiratory infection) 03/04/2022   Elevated troponin    Anemia 08/08/2021   Bladder spasm 08/08/2021   Chest pain 08/08/2021   Chest pain, rule out acute myocardial infarction 08/07/2021   Chronic diastolic CHF (congestive heart failure) (HCC) 02/03/2021   Cognitive decline 01/18/2021   Right knee pain 06/08/2020   Age-related osteoporosis without current pathological fracture 05/01/2020   Nasal congestion 05/01/2020   Personal history of COVID-19 05/01/2020   Bilateral hearing loss 11/15/2019   Post-nasal drainage 11/15/2019   COPD (chronic obstructive pulmonary disease) (HCC) 10/30/2019   Atrial fibrillation (HCC)    Lobar pneumonia (HCC) 09/18/2019   Adhesive capsulitis of right shoulder 07/29/2019   Metabolic encephalopathy 07/29/2019   Dysphagia due to suspected esophageal stenosis 07/18/2019   S/P reverse total shoulder arthroplasty, 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   Rotator cuff arthropathy of left shoulder 05/03/2019   Shoulder pain, left 04/29/2019   Low back pain 03/13/2019   Degeneration of lumbar intervertebral disc 02/14/2019   Amnesia 02/06/2018   ETD (Eustachian tube dysfunction), bilateral 11/07/2017   Chronic nonintractable headache 11/07/2017   Otalgia 10/17/2017   Pain in joint of right elbow 05/02/2017   Rib lesion 04/20/2017   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   Atopic dermatitis 04/08/2016   Skin sensation disturbance 03/08/2016   Weakness 12/22/2015   Allergic rhinitis 09/15/2015   CAD (coronary artery disease) 05/18/2015   Essential hypertension 05/18/2015   Carotid artery disease (HCC) 05/18/2015   Cough 03/19/2015   Shortness of breath 12/26/2013   Abnormal gait 11/06/2013   Scoliosis (and kyphoscoliosis), idiopathic 07/30/2013   Acquired unequal leg length on left 04/11/2013   History of fracture 10/18/2012   Palpitations 10/18/2012   Constipation 09/05/2012   History of sinus tachycardia 09/05/2012    Spasm of back muscles 03/12/2012   Lumbar radiculopathy 11/23/2011   Mitral regurgitation 11/22/2011   Low compliance bladder 04/18/2011   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   Carpal tunnel syndrome 02/23/2009   Cardiovascular symptoms 02/23/2009   Vitamin D deficiency 02/23/2009   RECTAL BLEEDING 08/18/2008   PERSONAL HX COLONIC POLYPS 08/18/2008   SKIN CANCER, HX OF 08/14/2008   CARPAL TUNNEL SYNDROME, HX OF 08/14/2008   SARCOIDOSIS, PULMONARY 08/08/2008   HLD (hyperlipidemia) 08/08/2008   Paroxysmal supraventricular tachycardia (HCC) 08/08/2008   GERD (gastroesophageal reflux disease) 08/08/2008   PCP:  Joycelyn Man, NP Pharmacy:   [facility] - [place], [place] - [address] [place] [address] Phone: [phone] Fax: [phone]     Social Determinants of Health (SDOH) Social History: SDOH Screenings   Food Insecurity: Patient Unable To Answer (07/14/2022)  Housing: High Risk (07/14/2022)  Transportation Needs: No Transportation Needs (07/14/2022)  Utilities: Not At Risk (07/14/2022)  Tobacco Use: Low Risk  (07/13/2022)   SDOH Interventions:     Readmission Risk Interventions     No data to display

## 2022-07-14 NOTE — Progress Notes (Signed)
PROGRESS NOTE  [name] ZOX:[id number] DOB: [birth year] DOA: 07/13/2022 PCP: Joycelyn Man, NP  HPI/Recap of past 24 hours: [name] is a 88 y.o. female with medical history significant for paroxysmal supraventricular tachycardia, A-fib, chronic diastolic HF, sarcoid, HTN, CAD presents with worsening dyspnea on exertion, bilateral lower extremity edema for the past couple of weeks.  Patient is currently in an assisted living facility, [facility]. Discussed extensively with daughter at bedside who assisted with the history.  Reports dissatisfaction with [facility], due to several issues including not giving patient needed medication.  Patient was supposed to be getting Lasix on a daily basis as per daughter, but was not receiving it as well as amiodarone.  In the ED, vital signs fairly stable, chest x-ray showed pulmonary vascular congestion with some mild elevation of BNP.  Patient given 1 dose of Lasix.  [facility] hospitalist called to admit patient for further management.     Today, patient denies any new complaints.  Reports some improvement.  Discussed again with daughter at bedside, all questions answered.  Plan for SLP, PT/OT today.  Denies any chest pain    Assessment/Plan: Principal Problem:   Acute on chronic diastolic (congestive) heart failure (HCC) Active Problems:   Atrial fibrillation (HCC)   GERD (gastroesophageal reflux disease)   SARCOIDOSIS, PULMONARY   COPD (chronic obstructive pulmonary disease) (HCC)   Acute on chronic diastolic HF BNP 332 Initial troponin 8 EKG with no acute ST changes Chest x-ray with pulmonary vascular congestion Echo done 4/24 showed EF of 55%, no regional wall motion abnormality, left ventricular diastolic parameters are indeterminate, pulmonary artery systolic pressure 30 mmHg BLE Doppler negative for DVT Continue IV Lasix, diuresing well Strict I's and O's, daily weights  Hypokalemia Replace  as needed   History of COPD History of sarcoid No noted wheezing or oxygen requirement Continue nebs, inhaler   History of dysphagia Currently on dysphagia type II diet SLP consulted   History of paroxysmal A-fib EKG showing normal sinus rhythm, rate controlled Continue amiodarone, Eliquis   Hypertension BP soft Will switch Lasix to 20 mg IV twice daily   Anemia of chronic disease Iron deficiency anemia Vitamin B12 deficiency  Hemoglobin 9.3 on admission, baseline around 11-12 Anemia panel showed iron 15, sats 5, vitamin B12-->202 Start IV iron x 4 doses Start vitamin B12 supplementation Daily CBC  Pressure Injury 07/13/22 Buttocks Right Deep Tissue Pressure Injury - Purple or maroon localized area of discolored intact skin or blood-filled blister due to damage of underlying soft tissue from pressure and/or shear. 3 cm x 2 cm (Active)  07/13/22   Location: Buttocks  Location Orientation: Right  Staging: Deep Tissue Pressure Injury - Purple or maroon localized area of discolored intact skin or blood-filled blister due to damage of underlying soft tissue from pressure and/or shear.  Wound Description (Comments): 3 cm x 2 cm  Present on Admission: Yes  Dressing Type Impregnated gauze (bismuth) 07/14/22 1100     Pressure Injury 07/13/22 Buttocks Left Deep Tissue Pressure Injury - Purple or maroon localized area of discolored intact skin or blood-filled blister due to damage of underlying soft tissue from pressure and/or shear. 2 cm x 3 cm (Active)  07/13/22   Location: Buttocks  Location Orientation: Left  Staging: Deep Tissue Pressure Injury - Purple or maroon localized area of discolored intact skin or blood-filled blister due to damage of underlying soft tissue from pressure and/or shear.  Wound Description (Comments): 2 cm  x 3 cm  Present on Admission: Yes  Dressing Type Impregnated gauze (bismuth) 07/14/22 1100     Pressure Injury 07/13/22 Heel Right Stage 1 -  Intact  skin with non-blanchable redness of a localized area usually over a bony prominence. 1 cm x 1 cm (Active)  07/13/22   Location: Heel  Location Orientation: Right  Staging: Stage 1 -  Intact skin with non-blanchable redness of a localized area usually over a bony prominence.  Wound Description (Comments): 1 cm x 1 cm  Present on Admission: Yes  Dressing Type Foam - Lift dressing to assess site every shift 07/14/22 1100     Estimated body mass index is 21.64 kg/m as calculated from the following:   Height as of 05/20/22: 5\' 3"  (1.6 m).   Weight as of this encounter: 55.4 kg.     Code Status: DNR  Family Communication: Discussed with daughter at bedside  Disposition Plan: Status is: Inpatient Remains inpatient appropriate because: Level of care      Consultants: None  Procedures: None  Antimicrobials: None  DVT prophylaxis: Eliquis   Objective: Vitals:   07/14/22 0539 07/14/22 0755 07/14/22 0800 07/14/22 1242  BP: (!) 121/56   (!) 107/51  Pulse: 65   63  Resp: 16   18  Temp: 98.5 F (36.9 C)   98.3 F (36.8 C)  TempSrc: Oral   Oral  SpO2: 96% 95%  97%  Weight:   55.4 kg     Intake/Output Summary (Last 24 hours) at 07/14/2022 1353 Last data filed at 07/14/2022 1316 Gross per 24 hour  Intake 340 ml  Output 2700 ml  Net -2360 ml   Filed Weights   07/14/22 0800  Weight: 55.4 kg    Exam: General: NAD  Cardiovascular: S1, S2 present Respiratory: CTAB Abdomen: Soft, nontender, nondistended, bowel sounds present Musculoskeletal: 1+ bilateral pedal edema noted Skin: Normal Psychiatry: Normal mood     Data Reviewed: CBC: Recent Labs  Lab 07/13/22 1440 07/14/22 0413  WBC 7.5 6.2  NEUTROABS 5.2  --   HGB 9.3* 9.1*  HCT 30.7* 28.5*  MCV 88.5 88.0  PLT 309 314   Basic Metabolic Panel: Recent Labs  Lab 07/13/22 1440 07/14/22 0413 07/14/22 0436  NA 137 137  --   K 3.7 3.3*  --   CL 106 101  --   CO2 22 26  --   GLUCOSE 109* 99  --   BUN  12 13  --   CREATININE 0.73 0.81  --   CALCIUM 8.5* 8.2*  --   MG  --   --  1.7   GFR: Estimated Creatinine Clearance: 41.2 mL/min (by C-G formula based on SCr of 0.81 mg/dL). Liver Function Tests: Recent Labs  Lab 07/14/22 0413  AST 16  ALT 12  ALKPHOS 67  BILITOT 0.5  PROT 6.0*  ALBUMIN 3.1*   No results for input(s): "LIPASE", "AMYLASE" in the last 168 hours. No results for input(s): "AMMONIA" in the last 168 hours. Coagulation Profile: No results for input(s): "INR", "PROTIME" in the last 168 hours. Cardiac Enzymes: No results for input(s): "CKTOTAL", "CKMB", "CKMBINDEX", "TROPONINI" in the last 168 hours. BNP (last 3 results) No results for input(s): "PROBNP" in the last 8760 hours. HbA1C: No results for input(s): "HGBA1C" in the last 72 hours. CBG: No results for input(s): "GLUCAP" in the last 168 hours. Lipid Profile: No results for input(s): "CHOL", "HDL", "LDLCALC", "TRIG", "CHOLHDL", "LDLDIRECT" in the last 72 hours. Thyroid  Function Tests: No results for input(s): "TSH", "T4TOTAL", "FREET4", "T3FREE", "THYROIDAB" in the last 72 hours. Anemia Panel: Recent Labs    07/14/22 0413  VITAMINB12 202  FOLATE 7.7  FERRITIN 30  TIBC 291  IRON 15*   Urine analysis:    Component Value Date/Time   COLORURINE AMBER (A) 04/18/2022 2122   APPEARANCEUR CLEAR 04/18/2022 2122   LABSPEC 1.018 04/18/2022 2122   PHURINE 5.0 04/18/2022 2122   GLUCOSEU NEGATIVE 04/18/2022 2122   HGBUR NEGATIVE 04/18/2022 2122   BILIRUBINUR NEGATIVE 04/18/2022 2122   BILIRUBINUR negative 03/21/2018 1328   KETONESUR 5 (A) 04/18/2022 2122   PROTEINUR NEGATIVE 04/18/2022 2122   UROBILINOGEN negative (A) 03/21/2018 1328   UROBILINOGEN 0.2 12/08/2008 1204   NITRITE NEGATIVE 04/18/2022 2122   LEUKOCYTESUR NEGATIVE 04/18/2022 2122   Sepsis Labs: @LABRCNTIP (procalcitonin:4,lacticidven:4)  )No results found for this or any previous visit (from the past 240 hour(s)).    Studies: VAS [place]  LOWER EXTREMITY VENOUS (DVT)  Result Date: 07/14/2022  Lower Venous DVT Study Patient Name:  ARIAUNA ANDRESEN  Date of Exam:   07/14/2022 Medical Rec #: [id number]         Accession #:    [id number] Date of Birth: March 05, 1934         Patient Gender: F Patient Age:   58 years Exam Location:  [facility] Procedure:      VAS [place] LOWER EXTREMITY VENOUS (DVT) Referring Phys: [facility] [place] --------------------------------------------------------------------------------  Indications: Swelling.  Risk Factors: None identified. Limitations: Poor ultrasound/tissue interface. Comparison Study: No prior studies. Performing Technologist: Chanda Busing RVT  Examination Guidelines: A complete evaluation includes B-mode imaging, spectral Doppler, color Doppler, and power Doppler as needed of all accessible portions of each vessel. Bilateral testing is considered an integral part of a complete examination. Limited examinations for reoccurring indications may be performed as noted. The reflux portion of the exam is performed with the patient in reverse Trendelenburg.  +---------+---------------+---------+-----------+----------+--------------+ RIGHT    CompressibilityPhasicitySpontaneityPropertiesThrombus Aging +---------+---------------+---------+-----------+----------+--------------+ CFV      Full           Yes      Yes                                 +---------+---------------+---------+-----------+----------+--------------+ SFJ      Full                                                        +---------+---------------+---------+-----------+----------+--------------+ FV Prox  Full                                                        +---------+---------------+---------+-----------+----------+--------------+ FV Mid   Full                                                        +---------+---------------+---------+-----------+----------+--------------+ FV DistalFull                                                         +---------+---------------+---------+-----------+----------+--------------+  PFV      Full                                                        +---------+---------------+---------+-----------+----------+--------------+ POP      Full           Yes      Yes                                 +---------+---------------+---------+-----------+----------+--------------+ PTV      Full                                                        +---------+---------------+---------+-----------+----------+--------------+ PERO     Full                                                        +---------+---------------+---------+-----------+----------+--------------+   +---------+---------------+---------+-----------+----------+--------------+ LEFT     CompressibilityPhasicitySpontaneityPropertiesThrombus Aging +---------+---------------+---------+-----------+----------+--------------+ CFV      Full           Yes      Yes                                 +---------+---------------+---------+-----------+----------+--------------+ SFJ      Full                                                        +---------+---------------+---------+-----------+----------+--------------+ FV Prox  Full                                                        +---------+---------------+---------+-----------+----------+--------------+ FV Mid   Full                                                        +---------+---------------+---------+-----------+----------+--------------+ FV DistalFull                                                        +---------+---------------+---------+-----------+----------+--------------+ PFV      Full                                                        +---------+---------------+---------+-----------+----------+--------------+  POP      Full           Yes      Yes                                  +---------+---------------+---------+-----------+----------+--------------+ PTV      Full                                                        +---------+---------------+---------+-----------+----------+--------------+ PERO     Full                                                        +---------+---------------+---------+-----------+----------+--------------+    Summary: RIGHT: - There is no evidence of deep vein thrombosis in the lower extremity.  - No cystic structure found in the popliteal fossa.  LEFT: - There is no evidence of deep vein thrombosis in the lower extremity.  - No cystic structure found in the popliteal fossa.  *See table(s) above for measurements and observations.    Preliminary    DG Chest 2 View  Result Date: 07/13/2022 CLINICAL DATA:  Short of breath, bilateral lower extremity edema, chest tightness EXAM: CHEST - 2 VIEW COMPARISON:  09/20/2022 FINDINGS: Frontal and lateral views of the chest demonstrate stable enlargement of the cardiac silhouette. The lungs are hyperinflated. There is increased pulmonary vascular congestion, with small bilateral pleural effusions. No acute airspace disease or pneumothorax. Stable left shoulder arthroplasty. IMPRESSION: 1. Enlarged cardiac silhouette, with increased pulmonary vascular congestion. No overt edema. 2. Small bilateral pleural effusions. 3. Hyperinflation of the lungs. Electronically Signed   By: Sharlet Salina M.D.   On: 07/13/2022 15:58    Scheduled Meds:  amiodarone  100 mg Oral Daily   apixaban  2.5 mg Oral BID   [START ON 07/15/2022] vitamin B-12  1,000 mcg Oral Daily   furosemide  40 mg Intravenous BID   umeclidinium bromide  1 puff Inhalation Daily   And   mometasone-formoterol  2 puff Inhalation BID   pantoprazole  40 mg Oral Daily   polyethylene glycol  17 g Oral Daily   sertraline  50 mg Oral QHS    Continuous Infusions:  ferric gluconate (FERRLECIT) IVPB 250 mg (07/14/22 0943)     LOS: 1 day      Briant Cedar, MD [facility]  If 7PM-7AM, please contact night-coverage [email or web address] 07/14/2022, 1:53 PM

## 2022-07-15 DIAGNOSIS — I5033 Acute on chronic diastolic (congestive) heart failure: Secondary | ICD-10-CM | POA: Diagnosis not present

## 2022-07-15 LAB — CBC WITH DIFFERENTIAL/PLATELET
Abs Immature Granulocytes: 0.07 10*3/uL (ref 0.00–0.07)
Basophils Absolute: 0 10*3/uL (ref 0.0–0.1)
Basophils Relative: 1 %
Eosinophils Absolute: 0.2 10*3/uL (ref 0.0–0.5)
Eosinophils Relative: 2 %
HCT: 30.4 % — ABNORMAL LOW (ref 36.0–46.0)
Hemoglobin: 9.6 g/dL — ABNORMAL LOW (ref 12.0–15.0)
Immature Granulocytes: 1 %
Lymphocytes Relative: 19 %
Lymphs Abs: 1.6 10*3/uL (ref 0.7–4.0)
MCH: 27.6 pg (ref 26.0–34.0)
MCHC: 31.6 g/dL (ref 30.0–36.0)
MCV: 87.4 fL (ref 80.0–100.0)
Monocytes Absolute: 0.7 10*3/uL (ref 0.1–1.0)
Monocytes Relative: 8 %
Neutro Abs: 5.7 10*3/uL (ref 1.7–7.7)
Neutrophils Relative %: 69 %
Platelets: 341 10*3/uL (ref 150–400)
RBC: 3.48 MIL/uL — ABNORMAL LOW (ref 3.87–5.11)
RDW: 14.9 % (ref 11.5–15.5)
WBC: 8.2 10*3/uL (ref 4.0–10.5)
nRBC: 0 % (ref 0.0–0.2)

## 2022-07-15 LAB — BASIC METABOLIC PANEL
Anion gap: 9 (ref 5–15)
BUN: 18 mg/dL (ref 8–23)
CO2: 26 mmol/L (ref 22–32)
Calcium: 8.4 mg/dL — ABNORMAL LOW (ref 8.9–10.3)
Chloride: 100 mmol/L (ref 98–111)
Creatinine, Ser: 1.05 mg/dL — ABNORMAL HIGH (ref 0.44–1.00)
GFR, Estimated: 52 mL/min — ABNORMAL LOW (ref >60)
Glucose, Bld: 115 mg/dL — ABNORMAL HIGH (ref 70–99)
Potassium: 4 mmol/L (ref 3.5–5.1)
Sodium: 135 mmol/L (ref 135–145)

## 2022-07-15 MED ORDER — ALUM & MAG HYDROXIDE-SIMETH 200-200-20 MG/5ML PO SUSP
15.0000 mL | Freq: Once | ORAL | Status: AC
  Administered 2022-07-15: 15 mL via ORAL
  Filled 2022-07-15: qty 30

## 2022-07-15 NOTE — Progress Notes (Signed)
PROGRESS NOTE  [name]:[id number] DOB: [birth year] DOA: 07/13/2022 PCP: Joycelyn Man, NP  HPI/Recap of past 24 hours: [name] is a 88 y.o. female with medical history significant for paroxysmal supraventricular tachycardia, A-fib, chronic diastolic HF, sarcoid, HTN, CAD presents with worsening dyspnea on exertion, bilateral lower extremity edema for the past couple of weeks.  Patient is currently in an assisted living facility, [facility]. Discussed extensively with daughter at bedside who assisted with the history.  Reports dissatisfaction with [facility], due to several issues including not giving patient needed medication.  Patient was supposed to be getting Lasix on a daily basis as per daughter, but was not receiving it as well as amiodarone.  In the ED, vital signs fairly stable, chest x-ray showed pulmonary vascular congestion with some mild elevation of BNP.  Patient given 1 dose of Lasix.  [facility] hospitalist called to admit patient for further management.     Today, patient denies any new complaints.  Extensively discussed with daughter about overall care and discharge plans.  Bilateral lower extremity edema significantly resolving    Assessment/Plan: Principal Problem:   Acute on chronic diastolic (congestive) heart failure (HCC) Active Problems:   Atrial fibrillation (HCC)   GERD (gastroesophageal reflux disease)   SARCOIDOSIS, PULMONARY   COPD (chronic obstructive pulmonary disease) (HCC)   Acute on chronic diastolic HF BNP 332 Initial troponin 8 EKG with no acute ST changes Chest x-ray with pulmonary vascular congestion Echo done 4/24 showed EF of 55%, no regional wall motion abnormality, left ventricular diastolic parameters are indeterminate, pulmonary artery systolic pressure 30 mmHg BLE Doppler negative for DVT Held IV Lasix due to rise in Cr, diuresing well Strict I's and O's, daily  weights  Hypokalemia Replace as needed   History of COPD History of sarcoid No noted wheezing or oxygen requirement Continue nebs, inhaler   History of dysphagia Currently on dysphagia type II diet SLP consulted   History of paroxysmal A-fib EKG showing normal sinus rhythm, rate controlled Continue amiodarone, Eliquis   Hypertension BP soft Held lasix due to rise in Cr   Anemia of chronic disease Iron deficiency anemia Vitamin B12 deficiency  Hemoglobin 9.3 on admission, baseline around 11-12 Anemia panel showed iron 15, sats 5, vitamin B12-->202 Start IV iron x 4 doses Start vitamin B12 supplementation Daily CBC   Pressure Injury 07/13/22 Buttocks Right Deep Tissue Pressure Injury - Purple or maroon localized area of discolored intact skin or blood-filled blister due to damage of underlying soft tissue from pressure and/or shear. 3 cm x 2 cm (Active)  07/13/22   Location: Buttocks  Location Orientation: Right  Staging: Deep Tissue Pressure Injury - Purple or maroon localized area of discolored intact skin or blood-filled blister due to damage of underlying soft tissue from pressure and/or shear.  Wound Description (Comments): 3 cm x 2 cm  Present on Admission: Yes  Dressing Type Foam - Lift dressing to assess site every shift 07/15/22 0815     Pressure Injury 07/13/22 Buttocks Left Deep Tissue Pressure Injury - Purple or maroon localized area of discolored intact skin or blood-filled blister due to damage of underlying soft tissue from pressure and/or shear. 2 cm x 3 cm (Active)  07/13/22   Location: Buttocks  Location Orientation: Left  Staging: Deep Tissue Pressure Injury - Purple or maroon localized area of discolored intact skin or blood-filled blister due to damage of underlying soft tissue from pressure and/or shear.  Wound Description (  Comments): 2 cm x 3 cm  Present on Admission: Yes  Dressing Type Foam - Lift dressing to assess site every shift 07/15/22 0815      Pressure Injury 07/13/22 Heel Right Stage 1 -  Intact skin with non-blanchable redness of a localized area usually over a bony prominence. 1 cm x 1 cm (Active)  07/13/22   Location: Heel  Location Orientation: Right  Staging: Stage 1 -  Intact skin with non-blanchable redness of a localized area usually over a bony prominence.  Wound Description (Comments): 1 cm x 1 cm  Present on Admission: Yes  Dressing Type Foam - Lift dressing to assess site every shift 07/15/22 0812     Estimated body mass index is 23.43 kg/m as calculated from the following:   Height as of this encounter: 5\' 3"  (1.6 m).   Weight as of this encounter: 60 kg.     Code Status: DNR  Family Communication: Discussed with daughter at bedside  Disposition Plan: Status is: Inpatient Remains inpatient appropriate because: Level of care      Consultants: None  Procedures: None  Antimicrobials: None  DVT prophylaxis: Eliquis   Objective: Vitals:   07/15/22 0437 07/15/22 0906 07/15/22 0909 07/15/22 1405  BP: (!) 129/54   (!) 101/52  Pulse: 67   65  Resp: 18     Temp: 99 F (37.2 C)   98.2 F (36.8 C)  TempSrc: Oral   Oral  SpO2: 96% 94% 94% 100%  Weight: 60 kg     Height: 5\' 3"  (1.6 m)       Intake/Output Summary (Last 24 hours) at 07/15/2022 1751 Last data filed at 07/15/2022 0441 Gross per 24 hour  Intake 596.31 ml  Output 850 ml  Net -253.69 ml   Filed Weights   07/14/22 0800 07/15/22 0437  Weight: 55.4 kg 60 kg    Exam: General: NAD  Cardiovascular: S1, S2 present Respiratory: CTAB Abdomen: Soft, nontender, nondistended, bowel sounds present Musculoskeletal: No bilateral pedal edema noted Skin: Normal Psychiatry: Normal mood     Data Reviewed: CBC: Recent Labs  Lab 07/13/22 1440 07/14/22 0413 07/15/22 0427  WBC 7.5 6.2 8.2  NEUTROABS 5.2  --  5.7  HGB 9.3* 9.1* 9.6*  HCT 30.7* 28.5* 30.4*  MCV 88.5 88.0 87.4  PLT 309 314 341   Basic Metabolic Panel: Recent  Labs  Lab 07/13/22 1440 07/14/22 0413 07/14/22 0436 07/15/22 0427  NA 137 137  --  135  K 3.7 3.3*  --  4.0  CL 106 101  --  100  CO2 22 26  --  26  GLUCOSE 109* 99  --  115*  BUN 12 13  --  18  CREATININE 0.73 0.81  --  1.05*  CALCIUM 8.5* 8.2*  --  8.4*  MG  --   --  1.7  --    GFR: Estimated Creatinine Clearance: 31.8 mL/min (A) (by C-G formula based on SCr of 1.05 mg/dL (H)). Liver Function Tests: Recent Labs  Lab 07/14/22 0413  AST 16  ALT 12  ALKPHOS 67  BILITOT 0.5  PROT 6.0*  ALBUMIN 3.1*   No results for input(s): "LIPASE", "AMYLASE" in the last 168 hours. No results for input(s): "AMMONIA" in the last 168 hours. Coagulation Profile: No results for input(s): "INR", "PROTIME" in the last 168 hours. Cardiac Enzymes: No results for input(s): "CKTOTAL", "CKMB", "CKMBINDEX", "TROPONINI" in the last 168 hours. BNP (last 3 results) No results for input(s): "  PROBNP" in the last 8760 hours. HbA1C: No results for input(s): "HGBA1C" in the last 72 hours. CBG: No results for input(s): "GLUCAP" in the last 168 hours. Lipid Profile: No results for input(s): "CHOL", "HDL", "LDLCALC", "TRIG", "CHOLHDL", "LDLDIRECT" in the last 72 hours. Thyroid Function Tests: No results for input(s): "TSH", "T4TOTAL", "FREET4", "T3FREE", "THYROIDAB" in the last 72 hours. Anemia Panel: Recent Labs    07/14/22 0413  VITAMINB12 202  FOLATE 7.7  FERRITIN 30  TIBC 291  IRON 15*   Urine analysis:    Component Value Date/Time   COLORURINE AMBER (A) 04/18/2022 2122   APPEARANCEUR CLEAR 04/18/2022 2122   LABSPEC 1.018 04/18/2022 2122   PHURINE 5.0 04/18/2022 2122   GLUCOSEU NEGATIVE 04/18/2022 2122   HGBUR NEGATIVE 04/18/2022 2122   BILIRUBINUR NEGATIVE 04/18/2022 2122   BILIRUBINUR negative 03/21/2018 1328   KETONESUR 5 (A) 04/18/2022 2122   PROTEINUR NEGATIVE 04/18/2022 2122   UROBILINOGEN negative (A) 03/21/2018 1328   UROBILINOGEN 0.2 12/08/2008 1204   NITRITE NEGATIVE  04/18/2022 2122   LEUKOCYTESUR NEGATIVE 04/18/2022 2122   Sepsis Labs: @LABRCNTIP (procalcitonin:4,lacticidven:4)  )No results found for this or any previous visit (from the past 240 hour(s)).    Studies: No results found.  Scheduled Meds:  amiodarone  100 mg Oral Daily   apixaban  2.5 mg Oral BID   vitamin B-12  1,000 mcg Oral Daily   umeclidinium bromide  1 puff Inhalation Daily   And   mometasone-formoterol  2 puff Inhalation BID   pantoprazole  40 mg Oral Daily   polyethylene glycol  17 g Oral Daily   sertraline  50 mg Oral QHS    Continuous Infusions:  ferric gluconate (FERRLECIT) IVPB 250 mg (07/15/22 1101)     LOS: 2 days     Briant Cedar, MD [facility]  If 7PM-7AM, please contact night-coverage [email or web address] 07/15/2022, 5:51 PM

## 2022-07-15 NOTE — Progress Notes (Signed)
Occupational Therapy Evaluation Patient Details Name: [name] MRN: [id number] DOB: Jul 29, 1934 Today's Date: 07/15/2022   History of Present Illness [name] is a 88 y.o. female admitted with acute on chronic diastolic heart failure. PMH: paroxysmal supraventricular tachycardia, A-fib, chronic diastolic HF, sarcoid, HTN, CAD   Clinical Impression   The pt has been at an ALF since ~May 6th of this year. Per her daughter, the pt will not be returning to the ALF, as she feels the pt needs increased care and they are also interested in SNF rehab services for the pt. The pt currently requires increased assist for tasks, such as lower body dressing and toileting. During the session today, she stood with assist and took a few forward/backward steps using a RW, though needing assist, as unsteadiness was noted. Pt also has chronic R knee pain from arthritis & a Engineer, production' cyst; her daughter also indicated the pt has a fear of falling. The pt will benefit from further OT services to maximize her safety and independence width self-care tasks.      Recommendations for follow up therapy are one component of a multi-disciplinary discharge planning process, led by the attending physician.  Recommendations may be updated based on patient status, additional functional criteria and insurance authorization.   Assistance Recommended at Discharge Frequent or constant Supervision/Assistance  Patient can return home with the following A lot of help with bathing/dressing/bathroom;A lot of help with walking and/or transfers    Functional Status Assessment  Patient has had a recent decline in their functional status and demonstrates the ability to make significant improvements in function in a reasonable and predictable amount of time.  Equipment Recommendations  None recommended by OT       Precautions / Restrictions Precautions Precautions: Fall Restrictions Weight Bearing Restrictions: No Other  Position/Activity Restrictions: chronic R knee pain from arthritis and baker's cyst      Mobility Bed Mobility    General bed mobility comments: in recliner upon arrival    Transfers Overall transfer level: Needs assistance Equipment used: Rolling walker (2 wheels) Transfers: Sit to/from Stand Sit to Stand: Mod assist          Balance       Sitting balance - Comments: static sitting-good, dynamic sitting-fair+     Standing balance-Leahy Scale: Poor            ADL either performed or assessed with clinical judgement   ADL Overall ADL's : Needs assistance/impaired Eating/Feeding: Set up;Sitting   Grooming: Sitting;Set up           Upper Body Dressing : Minimal assistance;Sitting   Lower Body Dressing: Maximal assistance;Sit to/from stand                      Pertinent Vitals/Pain Pain Assessment Pain Assessment: Faces Pain Score: 4  Pain Location: R knee Pain Intervention(s): Limited activity within patient's tolerance, Monitored during session     Hand Dominance Right   Extremity/Trunk Assessment Upper Extremity Assessment Upper Extremity Assessment: Overall WFL for tasks assessed   Lower Extremity Assessment Lower Extremity Assessment: Overall WFL for tasks assessed RLE Deficits / Details:  AROM WFL, strength grossly 3+/5 LLE Deficits / Details: AROM WFL, strength grossly 3+/5       Communication     Cognition Arousal/Alertness: Awake/alert Behavior During Therapy: WFL for tasks assessed/performed Overall Cognitive Status: Within Functional Limits for tasks assessed  General Comments: pt pleasant, follows commands                Home Living Family/patient expects to be discharged to:: Skilled nursing facility                 Prior Functioning/Environment Prior Level of Function : Needs assist             Mobility Comments: daughter reports pt declining over last ~6 months; has been at ALF since  May 6 and was getting assist to transfer into w/c, no therapy, hasn't amb with RW since SNF d/c ADLs Comments: daughter reports pt declining over last ~6 months, receiving assist at SNF and ALF (Pt was able to feed self at ALF.)        OT Problem List: Decreased strength;Decreased activity tolerance;Impaired balance (sitting and/or standing);Decreased knowledge of use of DME or AE;Cardiopulmonary status limiting activity;Pain      OT Treatment/Interventions: Self-care/ADL training;Therapeutic exercise;Energy conservation;DME and/or AE instruction;Therapeutic activities;Balance training;Patient/family education    OT Goals(Current goals can be found in the care plan section) Acute Rehab OT Goals Patient Stated Goal: to walk independently again OT Goal Formulation: With patient Time For Goal Achievement: 07/29/22 Potential to Achieve Goals: Good ADL Goals Pt Will Perform Grooming: standing;with min guard assist Pt Will Perform Upper Body Dressing: with set-up;sitting Pt Will Perform Lower Body Dressing: with min guard assist;sit to/from stand Pt Will Transfer to Toilet: with min guard assist;ambulating  OT Frequency: Min 1X/week       AM-PAC OT "6 Clicks" Daily Activity     Outcome Measure Help from another person eating meals?: None Help from another person taking care of personal grooming?: A Little Help from another person toileting, which includes using toliet, bedpan, or urinal?: A Lot Help from another person bathing (including washing, rinsing, drying)?: A Lot Help from another person to put on and taking off regular upper body clothing?: A Little Help from another person to put on and taking off regular lower body clothing?: A Lot 6 Click Score: 16   End of Session Equipment Utilized During Treatment: Gait belt;Rolling walker (2 wheels) Nurse Communication: Mobility status  Activity Tolerance: Patient tolerated treatment well Patient left: in chair;with call bell/phone  within reach;with chair alarm set;with family/visitor present  OT Visit Diagnosis: Unsteadiness on feet (R26.81);Muscle weakness (generalized) (M62.81)                Time: 1610-9604 OT Time Calculation (min): 22 min Charges:  OT General Charges $OT Visit: 1 Visit OT Evaluation $OT Eval Moderate Complexity: 1 Mod     Ayren Zumbro L Carri Spillers, OTR/L 07/15/2022, 5:33 PM

## 2022-07-15 NOTE — TOC Progression Note (Signed)
Transition of Care [facility]) - Progression Note    Patient Details  Name: [name] MRN: [id number] Date of Birth: [birth year]  Transition of Care [facility]) CM/SW Contact  Larrie Kass, LCSW Phone Number: 07/15/2022, 1:53 PM  Clinical Narrative:    CSW spoke with pt's daughter, regarding rec for SNF placement. Pt's daughter she reports [facility] was able to accept her mother back on Tuesday. CSW informed pt's daughter, she will send pt's information out and speak with the facility.  CSW spoke to [place] Admission Coordinator with [facility], she reports they will not  have a room for the pt until next Tuesday.   Pt will not need insurance authorization can d/c when medically stable. CSW to fax pt out for SNF placement .TOC to follow.     Expected Discharge Plan:  (TBD) Barriers to Discharge: Continued Medical Work up  Expected Discharge Plan and Services       Living arrangements for the past 2 months: Assisted Living Facility                                       Social Determinants of Health (SDOH) Interventions SDOH Screenings   Food Insecurity: Patient Unable To Answer (07/14/2022)  Housing: High Risk (07/14/2022)  Transportation Needs: No Transportation Needs (07/14/2022)  Utilities: Not At Risk (07/14/2022)  Tobacco Use: Low Risk  (07/13/2022)    Readmission Risk Interventions     No data to display

## 2022-07-15 NOTE — Evaluation (Signed)
Clinical/Bedside Swallow Evaluation Patient Details  Name: NAKITTA WAFFLE MRN: [id number] Date of Birth: 12-12-1934  Today's Date: 07/15/2022 Time: SLP Start Time (ACUTE ONLY): 1051 SLP Stop Time (ACUTE ONLY): 1101 SLP Time Calculation (min) (ACUTE ONLY): 10 min  Past Medical History:  Past Medical History:  Diagnosis Date   Acute maxillary sinusitis 10/17/2017   Acute posthemorrhagic anemia 08/22/2012   Acute renal failure syndrome (HCC) 01/04/2019   Allergy    SEASONAL   Anemia    Arthritis    Asthma    "slight"    Baker's cyst of knee, right    CAD (coronary artery disease)    a. Myoview 10/15 - normal EF 70% // Myoview 11/16: EF 75%, normal perfusion, low risk // c. LHC 3/17 - LAD irregs, oLCx 70 (neg FFR), mRCA 30, EF 55-65% >> med Rx // Myoview 07/2018:  EF 73, extracardiac uptake, no significant ischemia (reviewed with Dr. Excell Seltzer), Low Risk // Myoview 6/22: EF 72, normal perfusion, low risk   Carotid stenosis    a. Carotid US 9/15 - Bilateral 1-39% ICA >> FU 2 years // b. Bilateral ICA 1-39 >> FU prn // Carotid US 06/2018: bilat 1-39; fu prn   Closed fracture of right olecranon process 05/09/2017   Closed left subtrochanteric femur fracture S/P Open/closed and reduction, internal medullary fixation  09/05/2012   Dyspnea    due to Sarcoidosis. When is windy or humed   Dysrhythmia    fast heart rate   Edema 10/30/2019   Elbow fracture 04/2017   Right, had surgery   Esophageal reflux    Fracture of inferior pubic ramus (HCC) 02/28/2019   Hematochezia    Hip fracture (HCC) 12/12/2018   History of echocardiogram    a. Echo 10/15 - EF 60-65%, no RWMA   HLD (hyperlipidemia)    Hx of colonoscopy    Hydronephrosis 07/29/2019   Left foot pain 09/02/2014   Assessment: 88 year old with acquired leg length discrepancy on the left who presents with 10 days of acute onset left foot pain. She seemed to be doing well prior to this pain with her orthotics that had been made to correct  her leg length discrepancy. Negative x-rays at outside office which were again reviewed today.   This pain does not seem to impair her function and which has actually been im   Macular pucker, right eye    will have removed on 01/26/21   Osteoporosis    Other chronic pulmonary heart diseases    Paroxysmal supraventricular tachycardia    Pneumonia    Pneumonia 09/18/2019   Pre-op evaluation 06/11/2019   Pyelonephritis 07/29/2019   Sacral insufficiency fracture 12/12/2018   Sarcoidosis    Sepsis (HCC) 07/30/2019   Sepsis due to Escherichia coli (e. 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   COLONOSCOPY     CYSTOSCOPY W/ RETROGRADES Left 07/30/2019   Procedure: CYSTOSCOPY WITH RETROGRADE PYELOGRAM LEFT STENT PLACEMENT;  Surgeon: Crist Fat, MD;  Location: [facility] ORS;  Service: Urology;  Laterality:  Left;   CYSTOSCOPY WITH RETROGRADE PYELOGRAM, URETEROSCOPY AND STENT PLACEMENT Left 08/20/2019   Procedure: CYSTOSCOPY, URETEROSCOPY AND STENT PLACEMENT;  Surgeon: Noel Christmas, MD;  Location: [facility] ORS;  Service: Urology;  Laterality: Left;  1 HR   FOOT SURGERY Right    HIP SURGERY Right 2011   full replacement   HOLMIUM LASER APPLICATION Left 08/20/2019   Procedure: HOLMIUM LASER APPLICATION;  Surgeon: Noel Christmas, MD;  Location: [facility] ORS;  Service: Urology;  Laterality: Left;   LEFT HEART CATH AND CORONARY ANGIOGRAPHY N/A 08/09/2021   Procedure: LEFT HEART CATH AND CORONARY ANGIOGRAPHY;  Surgeon: Tonny Bollman, MD;  Location: [facility]  INVASIVE CV LAB;  Service: Cardiovascular;  Laterality: N/A;   LEG SURGERY Left 06/2012   femur fracture s/p open and closed reduction in [place], Dr. Mayford Knife   lens replacement Right    right eye   ORIF ELBOW FRACTURE Right 05/09/2017   Procedure: ORIF right olecranon fracture with repair/reconstruction, ulnar nerve transposition as needed;  Surgeon: Dominica Severin, MD;  Location: [facility] OR;  Service: Orthopedics;  Laterality: Right;  Requests 90 mins   pelvis fracture     POLYPECTOMY     REVERSE SHOULDER ARTHROPLASTY Left 06/13/2019   Procedure: REVERSE SHOULDER ARTHROPLASTY;  Surgeon: Francena Hanly, MD;  Location: [facility] ORS;  Service: Orthopedics;  Laterality: Left;    SHOULDER ARTHROSCOPY W/ ROTATOR CUFF REPAIR Right    TRAPEZIUM RESECTION Right    HPI:  DOMINGO CARP is a 88 y.o. female with medical history significant for paroxysmal supraventricular tachycardia, A-fib, chronic diastolic HF, sarcoid, HTN, CAD presents with worsening dyspnea on exertion, bilateral lower extremity edema for the past couple of weeks. , chest x-ray showed pulmonary vascular congestion with some mild elevation of BNP.  Found to have acute on chronic heart failure. Esophagram 03/2022 revealed Dilated esophagus,  incomplete stripping and substantial stasis in the esophagus also reflux from the intrathoracic stomach into the esophagus with abundant stasis in the lumen of the esophagus. 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 BSE 2/27 rec thin and esophagram. Following day Dys 3 recommended.    Assessment / Plan / Recommendation  Clinical Impression  This referral generated by pt's daughter for possible upgrade of po texture (Dys 2 ordereded). Pt's daughter stated home health ST recommended to upgrade texture however [facility] continued to administer puree food. Pt and daughter report no changes or difficulty currently with swallow function. Her  oropharyngeal swallow across textures appeared safe, efficient without indications of airway compromise. Respiration and swallow adequately coordinated. SLP did provide education re: esophageal strategies based on esophagram results 03/2022. Recommended to sit upright and remain upright 45 min after meals, alternate liquids and solids, smaller more frequent meals. She may take meds with thin liquids. Texture upgraded to regular, continue thin and no further ST needed. SLP Visit Diagnosis: Dysphagia, unspecified (R13.10)    Aspiration Risk  Mild aspiration risk    Diet Recommendation Regular;Thin liquid   Liquid Administration via: Straw;Cup Medication Administration: Whole meds with liquid Supervision: Patient able to self feed Compensations: Follow solids with liquid Postural Changes: Remain upright for at least 30 minutes after po intake;Seated upright at 90 degrees    Other  Recommendations Oral Care Recommendations: Oral care BID    Recommendations for follow up therapy are one component of a multi-disciplinary discharge planning process, led by the attending physician.  Recommendations may be updated based on patient status, additional functional  criteria and insurance authorization.  Follow up Recommendations No SLP follow up      Assistance Recommended at Discharge    Functional Status Assessment Patient has not had a recent decline in their functional status  Frequency and Duration            Prognosis        Swallow Study   General HPI: SHIESHA MANCA is a 88 y.o. female with medical history significant for paroxysmal supraventricular tachycardia, A-fib, chronic diastolic HF, sarcoid, HTN, CAD presents with worsening dyspnea on exertion, bilateral lower extremity edema for the past couple of weeks. , chest x-ray showed pulmonary vascular congestion with some mild elevation of BNP.  Found to have acute on chronic heart failure. Esophagram 03/2022 revealed Dilated esophagus,   incomplete stripping and substantial stasis in the esophagus also reflux from the intrathoracic stomach into the esophagus with abundant stasis in the lumen of the esophagus. 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 BSE 2/27 rec thin and esophagram. Following day Dys 3 recommended. Type of Study: Bedside Swallow Evaluation Previous Swallow Assessment:  (03/2022 see HPI) Diet Prior to this Study: Dysphagia 2 (finely chopped);Thin liquids (Level 0) Temperature Spikes Noted: No Respiratory Status: Room air History of Recent Intubation: No Behavior/Cognition: Alert;Cooperative;Pleasant mood Oral Cavity Assessment: Within Functional Limits Oral Care Completed by SLP: No Oral Cavity - Dentition: Adequate natural dentition Vision: Functional for self-feeding Self-Feeding Abilities: Able to feed self Patient Positioning: Upright in chair Baseline Vocal Quality: Normal Volitional Cough: Strong Volitional Swallow: Able to elicit    Oral/Motor/Sensory Function Overall Oral Motor/Sensory Function: Within functional limits   Ice Chips Ice chips: Not tested   Thin Liquid Thin Liquid: Within functional limits Presentation: Straw    Nectar Thick Nectar Thick Liquid: Not tested   Honey Thick Honey Thick Liquid: Not tested   Puree Puree: Within functional limits   Solid     Solid: Within functional limits      Royce Macadamia 07/15/2022,11:19 AM

## 2022-07-15 NOTE — Evaluation (Signed)
Physical Therapy Evaluation Patient Details Name: [name] MRN: [id number] DOB: May 29, 1934 Today's Date: 07/15/2022  History of Present Illness  [name] is a 88 y.o. female admitted with acute on chronic diastolic heart failure. PMH: paroxysmal supraventricular tachycardia, A-fib, chronic diastolic HF, sarcoid, HTN, CAD  Clinical Impression  Pt admitted with above diagnosis. Per daughter, pt with multiple hospitalizations and SNF periods since Jan 2024, has becoming progressively weaker, moved to ALF May 6 where the pt has been transferring to w/c as a means of mobility and not ambulating. Pt currently needing mod A to power up from recliner, able to perform static marching with min A using RW, declines forward ambulation due to fear of falling. Verbal cues for hand placement and BLE engagement to power up to standing. Pt's daughter in room reports pt lived with her prior to decline in January, but that the home is not w/c accessible to care for the pt now. Pt may benefit from post acute PT (<3 hrs/day) to improve strength and functional mobility. Pt currently with functional limitations due to the deficits listed below (see PT Problem List). Pt will benefit from acute skilled PT to increase their independence and safety with mobility to allow discharge.          Recommendations for follow up therapy are one component of a multi-disciplinary discharge planning process, led by the attending physician.  Recommendations may be updated based on patient status, additional functional criteria and insurance authorization.  Follow Up Recommendations Can patient physically be transported by private vehicle: No     Assistance Recommended at Discharge Frequent or constant Supervision/Assistance  Patient can return home with the following       Equipment Recommendations None recommended by PT (daughter reports pt has RW and manual w/c)  Recommendations for Other Services        Functional Status Assessment Patient has had a recent decline in their functional status and demonstrates the ability to make significant improvements in function in a reasonable and predictable amount of time.     Precautions / Restrictions Precautions Precautions: Fall Restrictions Weight Bearing Restrictions: No      Mobility  Bed Mobility  General bed mobility comments: in recliner upon arrival    Transfers Overall transfer level: Needs assistance Equipment used: Rolling walker (2 wheels) Transfers: Sit to/from Stand Sit to Stand: Mod assist  General transfer comment: mod A for STS transfers from recliner, cues for hand placement and BLE engagement to power up with BUE assisting on armrests    Ambulation/Gait  General Gait Details: min A for static marching in place 10 times twice with seated rest break between, pt declines forward amb due to fear of falling, reports hasn't ambulated since May 6 and has just been transferring bed<>w/c  Stairs            Wheelchair Mobility    Modified Rankin (Stroke Patients Only)       Balance Overall balance assessment: Needs assistance  Standing balance support: Reliant on assistive device for balance, During functional activity, Bilateral upper extremity supported Standing balance-Leahy Scale: Poor        Pertinent Vitals/Pain Pain Assessment Pain Assessment: Faces Faces Pain Scale: Hurts even more Pain Location: R knee Pain Descriptors / Indicators: Aching, Sore Pain Intervention(s): Limited activity within patient's tolerance, Monitored during session, Repositioned    Home Living Family/patient expects to be discharged to:: Skilled nursing facility  Additional Comments: daughter works from home and reports house not  w/c accessible for pt    Prior Function Prior Level of Function : Needs assist    Mobility Comments: daughter reports pt declining over last ~6 months; has been at ALF since May 6 and was getting  assist to transfer into w/c, no therapy, hasn't amb with RW since SNF d/c ADLs Comments: daughter reports pt declining over last ~6 months, receiving assist at SNF and ALF     Hand Dominance        Extremity/Trunk Assessment   Upper Extremity Assessment Upper Extremity Assessment: Defer to OT evaluation    Lower Extremity Assessment Lower Extremity Assessment: Generalized weakness;RLE deficits/detail;LLE deficits/detail RLE Deficits / Details: knee swelling noted, AROM WFL, strength grossly 3+/5 RLE Sensation: WNL RLE Coordination: WNL LLE Deficits / Details: AROM WFL, strength grossly 3+/5 LLE Sensation: WNL LLE Coordination: WNL    Cervical / Trunk Assessment Cervical / Trunk Assessment: Kyphotic  Communication   Communication: HOH  Cognition Arousal/Alertness: Awake/alert Behavior During Therapy: WFL for tasks assessed/performed Overall Cognitive Status: Within Functional Limits for tasks assessed   General Comments: pt pleasant, follows commands        General Comments      Exercises     Assessment/Plan    PT Assessment Patient needs continued PT services  PT Problem List Decreased strength;Decreased activity tolerance;Decreased balance;Decreased mobility;Decreased safety awareness;Decreased knowledge of use of DME;Pain       PT Treatment Interventions DME instruction;Gait training;Functional mobility training;Therapeutic activities;Therapeutic exercise;Balance training;Neuromuscular re-education;Patient/family education    PT Goals (Current goals can be found in the Care Plan section)  Acute Rehab PT Goals Patient Stated Goal: "not return to [facility]" PT Goal Formulation: With patient/family Time For Goal Achievement: 07/29/22 Potential to Achieve Goals: Good    Frequency Min 1X/week     Co-evaluation               AM-PAC PT "6 Clicks" Mobility  Outcome Measure Help needed turning from your back to your side while in a flat bed without  using bedrails?: A Little Help needed moving from lying on your back to sitting on the side of a flat bed without using bedrails?: A Little Help needed moving to and from a bed to a chair (including a wheelchair)?: A Lot Help needed standing up from a chair using your arms (e.g., wheelchair or bedside chair)?: A Lot Help needed to walk in hospital room?: A Lot Help needed climbing 3-5 steps with a railing? : Total 6 Click Score: 13    End of Session Equipment Utilized During Treatment: Gait belt Activity Tolerance: Patient tolerated treatment well Patient left: in chair;with call bell/phone within reach;with chair alarm set;with family/visitor present;Other (comment);with nursing/sitter in room (SLP in room) Nurse Communication: Mobility status PT Visit Diagnosis: Unsteadiness on feet (R26.81);Other abnormalities of gait and mobility (R26.89);Muscle weakness (generalized) (M62.81);Pain Pain - Right/Left: Right Pain - part of body: Knee    Time: 1610-9604 PT Time Calculation (min) (ACUTE ONLY): 29 min   Charges:   PT Evaluation $PT Eval Low Complexity: 1 Low PT Treatments $Therapeutic Activity: 8-22 mins         Tori Jaymian Bogart PT, DPT 07/15/22, 1:03 PM

## 2022-07-15 NOTE — [place] FL2 (Signed)
[place] MEDICAID FL2 LEVEL OF CARE FORM     IDENTIFICATION  Patient Name: [name] Birthdate: [birth year] Sex: female Admission Date (Current Location): 07/13/2022  [facility] and [place] Number:  Producer, television/film/video and Address:  [facility],  [address] [place], [place] [address]      Provider Number: [id number]  Attending Physician Name and Address:  Briant Cedar, MD  Relative Name and Phone Number:  Braulio Conte (Daughter) [phone] (Mobile)    Current Level of Care: Hospital Recommended Level of Care: Skilled Nursing Facility Prior Approval Number:    Date Approved/Denied:   PASRR Number: [id number]  Discharge Plan: SNF    Current Diagnoses: Patient Active Problem List   Diagnosis Date Noted   Acute on chronic diastolic (congestive) heart failure (HCC) 07/13/2022   Pressure injury of skin 04/27/2022   Hypokalemia 04/27/2022   Pleural effusion on left 04/24/2022   Need for emotional support 04/24/2022   High risk medication use 04/24/2022   Pain 04/24/2022   DNR (do not resuscitate) 04/24/2022   End of life care 04/24/2022   Dyspnea 04/24/2022   Palliative care encounter 04/21/2022   Goals of care, counseling/discussion 04/21/2022   Counseling and coordination of care 04/21/2022   Malnutrition of moderate degree 04/20/2022   Atrial fibrillation with RVR (HCC) 04/18/2022   Hypotension 04/18/2022   Failure to thrive in adult 04/18/2022   Acute urinary retention 04/18/2022   RSV (respiratory syncytial virus pneumonia) 03/11/2022   COPD exacerbation (HCC) 03/09/2022   Paroxysmal atrial fibrillation with RVR (HCC) 03/09/2022   Acute respiratory failure with hypoxia (HCC) 03/09/2022   Bronchiectasis with acute exacerbation (HCC) 03/04/2022   URI (upper respiratory infection) 03/04/2022   Elevated troponin    Anemia 08/08/2021   Bladder spasm 08/08/2021   Chest pain 08/08/2021   Chest pain, rule out acute myocardial infarction  08/07/2021   Chronic diastolic CHF (congestive heart failure) (HCC) 02/03/2021   Cognitive decline 01/18/2021   Right knee pain 06/08/2020   Age-related osteoporosis without current pathological fracture 05/01/2020   Nasal congestion 05/01/2020   Personal history of COVID-19 05/01/2020   Bilateral hearing loss 11/15/2019   Post-nasal drainage 11/15/2019   COPD (chronic obstructive pulmonary disease) (HCC) 10/30/2019   Atrial fibrillation (HCC)    Lobar pneumonia (HCC) 09/18/2019   Adhesive capsulitis of right shoulder 07/29/2019   Metabolic encephalopathy 07/29/2019   Dysphagia due to suspected esophageal stenosis 07/18/2019   S/P reverse total shoulder arthroplasty, 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   Rotator cuff arthropathy of left shoulder 05/03/2019   Shoulder pain, left 04/29/2019   Low back pain 03/13/2019   Degeneration of lumbar intervertebral disc 02/14/2019   Amnesia 02/06/2018   ETD (Eustachian tube dysfunction), bilateral 11/07/2017   Chronic nonintractable headache 11/07/2017   Otalgia 10/17/2017   Pain in joint of right elbow 05/02/2017   Rib lesion 04/20/2017   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   Atopic dermatitis 04/08/2016   Skin sensation disturbance 03/08/2016   Weakness 12/22/2015   Allergic rhinitis 09/15/2015   CAD (coronary artery disease) 05/18/2015   Essential hypertension 05/18/2015   Carotid artery disease (HCC) 05/18/2015   Cough 03/19/2015   Shortness of breath 12/26/2013   Abnormal gait 11/06/2013   Scoliosis (and kyphoscoliosis), idiopathic 07/30/2013   Acquired unequal leg length on left 04/11/2013   History of fracture 10/18/2012   Palpitations 10/18/2012   Constipation 09/05/2012   History of sinus tachycardia 09/05/2012   Spasm of back muscles 03/12/2012  Lumbar radiculopathy 11/23/2011   Mitral regurgitation 11/22/2011   Low compliance bladder 04/18/2011   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   Carpal tunnel syndrome  02/23/2009   Cardiovascular symptoms 02/23/2009   Vitamin D deficiency 02/23/2009   RECTAL BLEEDING 08/18/2008   PERSONAL HX COLONIC POLYPS 08/18/2008   SKIN CANCER, HX OF 08/14/2008   CARPAL TUNNEL SYNDROME, HX OF 08/14/2008   SARCOIDOSIS, PULMONARY 08/08/2008   HLD (hyperlipidemia) 08/08/2008   Paroxysmal supraventricular tachycardia (HCC) 08/08/2008   GERD (gastroesophageal reflux disease) 08/08/2008    Orientation RESPIRATION BLADDER Height & Weight     Self, Place  Normal Incontinent Weight: 132 lb 4.4 oz (60 kg) Height:  5\' 3"  (160 cm)  BEHAVIORAL SYMPTOMS/MOOD NEUROLOGICAL BOWEL NUTRITION STATUS      Incontinent Diet (regular)  AMBULATORY STATUS COMMUNICATION OF NEEDS Skin   Extensive Assist   PU Stage and Appropriate Care (Pressure injury right heel , right and left buttocks)                       Personal Care Assistance Level of Assistance  Bathing, Feeding, Dressing Bathing Assistance: Limited assistance Feeding assistance: Independent Dressing Assistance: Limited assistance     Functional Limitations Info  Sight, Hearing, Speech Sight Info: Adequate Hearing Info: Adequate Speech Info: Adequate    SPECIAL CARE FACTORS FREQUENCY  PT (By licensed PT), OT (By licensed OT)     PT Frequency: 5 x a week OT Frequency: 5 x a week            Contractures Contractures Info: Not present    Additional Factors Info  Code Status, Allergies Code Status Info: DNR Allergies Info: Levaquin (Levofloxacin)  Oxycodone  Oxycodone-acetaminophen  Sulfonamide Derivatives  Sulfa Antibiotics  Morphine           Current Medications (07/15/2022):  This is the current hospital active medication list Current Facility-Administered Medications  Medication Dose Route Frequency Provider Last Rate Last Admin   acetaminophen (TYLENOL) tablet 650 mg  650 mg Oral Q6H PRN Briant Cedar, MD   650 mg at 07/15/22 0815   Or   acetaminophen (TYLENOL) suppository 650 mg  650  mg Rectal Q6H PRN Briant Cedar, MD       amiodarone (PACERONE) tablet 100 mg  100 mg Oral Daily Briant Cedar, MD   100 mg at 07/15/22 0816   apixaban (ELIQUIS) tablet 2.5 mg  2.5 mg Oral BID Briant Cedar, MD   2.5 mg at 07/15/22 9528   cyanocobalamin (VITAMIN B12) tablet 1,000 mcg  1,000 mcg Oral Daily Briant Cedar, MD   1,000 mcg at 07/15/22 0816   ferric gluconate (FERRLECIT) 250 mg in sodium chloride 0.9 % 250 mL IVPB  250 mg Intravenous Daily Briant Cedar, MD 135 mL/hr at 07/15/22 1101 250 mg at 07/15/22 1101   levalbuterol (XOPENEX) nebulizer solution 0.63 mg  0.63 mg Nebulization Q6H PRN Briant Cedar, MD       umeclidinium bromide (INCRUSE ELLIPTA) 62.5 MCG/ACT 1 puff  1 puff Inhalation Daily Briant Cedar, MD   1 puff at 07/15/22 0908   And   mometasone-formoterol (DULERA) 100-5 MCG/ACT inhaler 2 puff  2 puff Inhalation BID Briant Cedar, MD   2 puff at 07/15/22 0906   ondansetron (ZOFRAN) tablet 4 mg  4 mg Oral Q6H PRN Briant Cedar, MD       Or   ondansetron [facility]) injection 4 mg  4 mg Intravenous Q6H PRN Briant Cedar, MD       pantoprazole (PROTONIX) EC tablet 40 mg  40 mg Oral Daily Briant Cedar, MD   40 mg at 07/15/22 0816   polyethylene glycol (MIRALAX / GLYCOLAX) packet 17 g  17 g Oral Daily Briant Cedar, MD   17 g at 07/15/22 0816   senna-docusate (Senokot-S) tablet 1 tablet  1 tablet Oral QHS PRN Briant Cedar, MD       sertraline (ZOLOFT) tablet 50 mg  50 mg Oral QHS Briant Cedar, MD   50 mg at 07/14/22 2157     Discharge Medications: Please see discharge summary for a list of discharge medications.  Relevant Imaging Results:  Relevant Lab Results:   Additional Information SSN: [id number]  Valentina Shaggy Jaiyanna Safran, LCSW

## 2022-07-16 DIAGNOSIS — I5033 Acute on chronic diastolic (congestive) heart failure: Secondary | ICD-10-CM | POA: Diagnosis not present

## 2022-07-16 LAB — CBC WITH DIFFERENTIAL/PLATELET
Abs Immature Granulocytes: 0.12 10*3/uL — ABNORMAL HIGH (ref 0.00–0.07)
Basophils Absolute: 0.1 10*3/uL (ref 0.0–0.1)
Basophils Relative: 1 %
Eosinophils Absolute: 0.2 10*3/uL (ref 0.0–0.5)
Eosinophils Relative: 3 %
HCT: 28.4 % — ABNORMAL LOW (ref 36.0–46.0)
Hemoglobin: 9 g/dL — ABNORMAL LOW (ref 12.0–15.0)
Immature Granulocytes: 2 %
Lymphocytes Relative: 27 %
Lymphs Abs: 2 10*3/uL (ref 0.7–4.0)
MCH: 28.6 pg (ref 26.0–34.0)
MCHC: 31.7 g/dL (ref 30.0–36.0)
MCV: 90.2 fL (ref 80.0–100.0)
Monocytes Absolute: 0.8 10*3/uL (ref 0.1–1.0)
Monocytes Relative: 10 %
Neutro Abs: 4.2 10*3/uL (ref 1.7–7.7)
Neutrophils Relative %: 57 %
Platelets: 328 10*3/uL (ref 150–400)
RBC: 3.15 MIL/uL — ABNORMAL LOW (ref 3.87–5.11)
RDW: 15.1 % (ref 11.5–15.5)
WBC: 7.3 10*3/uL (ref 4.0–10.5)
nRBC: 0 % (ref 0.0–0.2)

## 2022-07-16 LAB — BASIC METABOLIC PANEL
Anion gap: 9 (ref 5–15)
BUN: 18 mg/dL (ref 8–23)
CO2: 25 mmol/L (ref 22–32)
Calcium: 8.6 mg/dL — ABNORMAL LOW (ref 8.9–10.3)
Chloride: 99 mmol/L (ref 98–111)
Creatinine, Ser: 0.8 mg/dL (ref 0.44–1.00)
GFR, Estimated: >60 mL/min (ref >60)
Glucose, Bld: 90 mg/dL (ref 70–99)
Potassium: 3.8 mmol/L (ref 3.5–5.1)
Sodium: 133 mmol/L — ABNORMAL LOW (ref 135–145)

## 2022-07-16 MED ORDER — FUROSEMIDE 40 MG PO TABS
40.0000 mg | ORAL_TABLET | Freq: Every day | ORAL | Status: DC
  Administered 2022-07-16 – 2022-07-19 (×4): 40 mg via ORAL
  Filled 2022-07-16 (×4): qty 1

## 2022-07-16 NOTE — Plan of Care (Signed)
  Problem: Cardiac: Goal: Ability to achieve and maintain adequate cardiopulmonary perfusion will improve Outcome: Progressing   Problem: Education: Goal: Knowledge of General Education information will improve Description: Including pain rating scale, medication(s)/side effects and non-pharmacologic comfort measures Outcome: Progressing   Problem: Health Behavior/Discharge Planning: Goal: Ability to manage health-related needs will improve Outcome: Progressing   Problem: Clinical Measurements: Goal: Ability to maintain clinical measurements within normal limits will improve Outcome: Progressing Goal: Will remain free from infection Outcome: Progressing Goal: Diagnostic test results will improve Outcome: Progressing Goal: Respiratory complications will improve Outcome: Progressing Goal: Cardiovascular complication will be avoided Outcome: Progressing   Problem: Activity: Goal: Risk for activity intolerance will decrease Outcome: Progressing   Problem: Nutrition: Goal: Adequate nutrition will be maintained Outcome: Progressing   Problem: Coping: Goal: Level of anxiety will decrease Outcome: Progressing   Problem: Elimination: Goal: Will not experience complications related to urinary retention Outcome: Progressing   Problem: Pain Managment: Goal: General experience of comfort will improve Outcome: Progressing

## 2022-07-16 NOTE — Progress Notes (Signed)
Pt care assumed at 1500 this shift. I have reviewed the previous RN's assessment and agree with their findings.  

## 2022-07-16 NOTE — Progress Notes (Signed)
PROGRESS NOTE  [name] ZOX:[id number] DOB: 1934/12/16 DOA: 07/13/2022 PCP: Joycelyn Man, NP  HPI/Recap of past 24 hours: [name] is a 88 y.o. female with medical history significant for paroxysmal supraventricular tachycardia, A-fib, chronic diastolic HF, sarcoid, HTN, CAD presents with worsening dyspnea on exertion, bilateral lower extremity edema for the past couple of weeks.  Patient is currently in an assisted living facility, [facility]. Discussed extensively with daughter at bedside who assisted with the history.  Reports dissatisfaction with [facility], due to several issues including not giving patient needed medication.  Patient was supposed to be getting Lasix on a daily basis as per daughter, but was not receiving it as well as amiodarone.  In the ED, vital signs fairly stable, chest x-ray showed pulmonary vascular congestion with some mild elevation of BNP.  Patient given 1 dose of Lasix.  [facility] hospitalist called to admit patient for further management.      Today, pt denies any new complaints.    Assessment/Plan: Principal Problem:   Acute on chronic diastolic (congestive) heart failure (HCC) Active Problems:   Atrial fibrillation (HCC)   GERD (gastroesophageal reflux disease)   SARCOIDOSIS, PULMONARY   COPD (chronic obstructive pulmonary disease) (HCC)   Acute on chronic diastolic HF BNP 332 Troponin 8 EKG with no acute ST changes Chest x-ray with pulmonary vascular congestion Echo done 4/24 showed EF of 55%, no regional wall motion abnormality, left ventricular diastolic parameters are indeterminate, pulmonary artery systolic pressure 30 mmHg BLE Doppler negative for DVT S/p IV Lasix, switch to PO lasix Strict I's and O's, daily weights  Hypokalemia Replace as needed   History of COPD History of sarcoid No noted wheezing or oxygen requirement Continue nebs, inhaler   History of dysphagia SLP consulted-  rec regular diet   History of paroxysmal A-fib EKG showing normal sinus rhythm, rate controlled Continue amiodarone, Eliquis   Hypertension BP soft  Anemia of chronic disease Iron deficiency anemia Vitamin B12 deficiency  Hemoglobin 9.3 on admission, baseline around 11-12 Anemia panel showed iron 15, sats 5, vitamin B12-->202 Start IV iron x 4 doses Start vitamin B12 supplementation Daily CBC   Pressure Injury 07/13/22 Buttocks Right Deep Tissue Pressure Injury - Purple or maroon localized area of discolored intact skin or blood-filled blister due to damage of underlying soft tissue from pressure and/or shear. 3 cm x 2 cm (Active)  07/13/22   Location: Buttocks  Location Orientation: Right  Staging: Deep Tissue Pressure Injury - Purple or maroon localized area of discolored intact skin or blood-filled blister due to damage of underlying soft tissue from pressure and/or shear.  Wound Description (Comments): 3 cm x 2 cm  Present on Admission: Yes  Dressing Type Foam - Lift dressing to assess site every shift 07/16/22 1020     Pressure Injury 07/13/22 Buttocks Left Deep Tissue Pressure Injury - Purple or maroon localized area of discolored intact skin or blood-filled blister due to damage of underlying soft tissue from pressure and/or shear. 2 cm x 3 cm (Active)  07/13/22   Location: Buttocks  Location Orientation: Left  Staging: Deep Tissue Pressure Injury - Purple or maroon localized area of discolored intact skin or blood-filled blister due to damage of underlying soft tissue from pressure and/or shear.  Wound Description (Comments): 2 cm x 3 cm  Present on Admission: Yes  Dressing Type Foam - Lift dressing to assess site every shift 07/16/22 1020     Pressure Injury 07/13/22  Heel Right Stage 1 -  Intact skin with non-blanchable redness of a localized area usually over a bony prominence. 1 cm x 1 cm (Active)  07/13/22   Location: Heel  Location Orientation: Right  Staging:  Stage 1 -  Intact skin with non-blanchable redness of a localized area usually over a bony prominence.  Wound Description (Comments): 1 cm x 1 cm  Present on Admission: Yes  Dressing Type Foam - Lift dressing to assess site every shift 07/16/22 1020     Estimated body mass index is 21.79 kg/m as calculated from the following:   Height as of this encounter: 5\' 3"  (1.6 m).   Weight as of this encounter: 55.8 kg.     Code Status: DNR  Family Communication: None at bedside  Disposition Plan: Status is: Inpatient Remains inpatient appropriate because: Level of care      Consultants: None  Procedures: None  Antimicrobials: None  DVT prophylaxis: Eliquis   Objective: Vitals:   07/16/22 0500 07/16/22 0621 07/16/22 0846 07/16/22 1339  BP:  (!) 110/47  (!) 103/46  Pulse:  65  62  Resp:  16  18  Temp:  98.2 F (36.8 C)  98.1 F (36.7 C)  TempSrc:  Oral    SpO2:  96% 97% 97%  Weight: 55.8 kg     Height:        Intake/Output Summary (Last 24 hours) at 07/16/2022 1535 Last data filed at 07/16/2022 1610 Gross per 24 hour  Intake 60 ml  Output 550 ml  Net -490 ml   Filed Weights   07/14/22 0800 07/15/22 0437 07/16/22 0500  Weight: 55.4 kg 60 kg 55.8 kg    Exam: General: NAD  Cardiovascular: S1, S2 present Respiratory: CTAB Abdomen: Soft, nontender, nondistended, bowel sounds present Musculoskeletal: No bilateral pedal edema noted Skin: Normal Psychiatry: Normal mood     Data Reviewed: CBC: Recent Labs  Lab 07/13/22 1440 07/14/22 0413 07/15/22 0427 07/16/22 0434  WBC 7.5 6.2 8.2 7.3  NEUTROABS 5.2  --  5.7 4.2  HGB 9.3* 9.1* 9.6* 9.0*  HCT 30.7* 28.5* 30.4* 28.4*  MCV 88.5 88.0 87.4 90.2  PLT 309 314 341 328   Basic Metabolic Panel: Recent Labs  Lab 07/13/22 1440 07/14/22 0413 07/14/22 0436 07/15/22 0427 07/16/22 0434  NA 137 137  --  135 133*  K 3.7 3.3*  --  4.0 3.8  CL 106 101  --  100 99  CO2 22 26  --  26 25  GLUCOSE 109* 99  --   115* 90  BUN 12 13  --  18 18  CREATININE 0.73 0.81  --  1.05* 0.80  CALCIUM 8.5* 8.2*  --  8.4* 8.6*  MG  --   --  1.7  --   --    GFR: Estimated Creatinine Clearance: 41.8 mL/min (by C-G formula based on SCr of 0.8 mg/dL). Liver Function Tests: Recent Labs  Lab 07/14/22 0413  AST 16  ALT 12  ALKPHOS 67  BILITOT 0.5  PROT 6.0*  ALBUMIN 3.1*   No results for input(s): "LIPASE", "AMYLASE" in the last 168 hours. No results for input(s): "AMMONIA" in the last 168 hours. Coagulation Profile: No results for input(s): "INR", "PROTIME" in the last 168 hours. Cardiac Enzymes: No results for input(s): "CKTOTAL", "CKMB", "CKMBINDEX", "TROPONINI" in the last 168 hours. BNP (last 3 results) No results for input(s): "PROBNP" in the last 8760 hours. HbA1C: No results for input(s): "HGBA1C" in the  last 72 hours. CBG: No results for input(s): "GLUCAP" in the last 168 hours. Lipid Profile: No results for input(s): "CHOL", "HDL", "LDLCALC", "TRIG", "CHOLHDL", "LDLDIRECT" in the last 72 hours. Thyroid Function Tests: No results for input(s): "TSH", "T4TOTAL", "FREET4", "T3FREE", "THYROIDAB" in the last 72 hours. Anemia Panel: Recent Labs    07/14/22 0413  VITAMINB12 202  FOLATE 7.7  FERRITIN 30  TIBC 291  IRON 15*   Urine analysis:    Component Value Date/Time   COLORURINE AMBER (A) 04/18/2022 2122   APPEARANCEUR CLEAR 04/18/2022 2122   LABSPEC 1.018 04/18/2022 2122   PHURINE 5.0 04/18/2022 2122   GLUCOSEU NEGATIVE 04/18/2022 2122   HGBUR NEGATIVE 04/18/2022 2122   BILIRUBINUR NEGATIVE 04/18/2022 2122   BILIRUBINUR negative 03/21/2018 1328   KETONESUR 5 (A) 04/18/2022 2122   PROTEINUR NEGATIVE 04/18/2022 2122   UROBILINOGEN negative (A) 03/21/2018 1328   UROBILINOGEN 0.2 12/08/2008 1204   NITRITE NEGATIVE 04/18/2022 2122   LEUKOCYTESUR NEGATIVE 04/18/2022 2122   Sepsis Labs: @LABRCNTIP (procalcitonin:4,lacticidven:4)  )No results found for this or any previous visit  (from the past 240 hour(s)).    Studies: No results found.  Scheduled Meds:  amiodarone  100 mg Oral Daily   apixaban  2.5 mg Oral BID   vitamin B-12  1,000 mcg Oral Daily   furosemide  40 mg Oral Daily   umeclidinium bromide  1 puff Inhalation Daily   And   mometasone-formoterol  2 puff Inhalation BID   pantoprazole  40 mg Oral Daily   polyethylene glycol  17 g Oral Daily   sertraline  50 mg Oral QHS    Continuous Infusions:  ferric gluconate (FERRLECIT) IVPB 250 mg (07/16/22 1036)     LOS: 3 days     Briant Cedar, MD [facility]  If 7PM-7AM, please contact night-coverage [email or web address] 07/16/2022, 3:35 PM

## 2022-07-17 DIAGNOSIS — I5033 Acute on chronic diastolic (congestive) heart failure: Secondary | ICD-10-CM | POA: Diagnosis not present

## 2022-07-17 LAB — BASIC METABOLIC PANEL
Anion gap: 8 (ref 5–15)
BUN: 23 mg/dL (ref 8–23)
CO2: 27 mmol/L (ref 22–32)
Calcium: 9 mg/dL (ref 8.9–10.3)
Chloride: 100 mmol/L (ref 98–111)
Creatinine, Ser: 0.9 mg/dL (ref 0.44–1.00)
GFR, Estimated: >60 mL/min (ref >60)
Glucose, Bld: 98 mg/dL (ref 70–99)
Potassium: 4 mmol/L (ref 3.5–5.1)
Sodium: 135 mmol/L (ref 135–145)

## 2022-07-17 LAB — CBC WITH DIFFERENTIAL/PLATELET
Abs Immature Granulocytes: 0.24 10*3/uL — ABNORMAL HIGH (ref 0.00–0.07)
Basophils Absolute: 0.1 10*3/uL (ref 0.0–0.1)
Basophils Relative: 1 %
Eosinophils Absolute: 0.3 10*3/uL (ref 0.0–0.5)
Eosinophils Relative: 3 %
HCT: 28.4 % — ABNORMAL LOW (ref 36.0–46.0)
Hemoglobin: 8.9 g/dL — ABNORMAL LOW (ref 12.0–15.0)
Immature Granulocytes: 3 %
Lymphocytes Relative: 28 %
Lymphs Abs: 2.1 10*3/uL (ref 0.7–4.0)
MCH: 28.3 pg (ref 26.0–34.0)
MCHC: 31.3 g/dL (ref 30.0–36.0)
MCV: 90.2 fL (ref 80.0–100.0)
Monocytes Absolute: 0.7 10*3/uL (ref 0.1–1.0)
Monocytes Relative: 10 %
Neutro Abs: 4.1 10*3/uL (ref 1.7–7.7)
Neutrophils Relative %: 55 %
Platelets: 340 10*3/uL (ref 150–400)
RBC: 3.15 MIL/uL — ABNORMAL LOW (ref 3.87–5.11)
RDW: 15.4 % (ref 11.5–15.5)
WBC: 7.5 10*3/uL (ref 4.0–10.5)
nRBC: 0 % (ref 0.0–0.2)

## 2022-07-17 MED ORDER — FERROUS GLUCONATE 324 (38 FE) MG PO TABS
324.0000 mg | ORAL_TABLET | Freq: Every day | ORAL | Status: DC
  Administered 2022-07-18 – 2022-07-19 (×2): 324 mg via ORAL
  Filled 2022-07-17 (×2): qty 1

## 2022-07-17 MED ORDER — ALUM & MAG HYDROXIDE-SIMETH 200-200-20 MG/5ML PO SUSP
15.0000 mL | Freq: Four times a day (QID) | ORAL | Status: DC | PRN
  Administered 2022-07-17: 15 mL via ORAL
  Filled 2022-07-17 (×2): qty 30

## 2022-07-17 NOTE — Plan of Care (Signed)
  Problem: Education: Goal: Ability to demonstrate management of disease process will improve Outcome: Progressing Goal: Ability to verbalize understanding of medication therapies will improve Outcome: Progressing   

## 2022-07-17 NOTE — Progress Notes (Signed)
PROGRESS NOTE  [name]:[id number] DOB: 08/21/1934 DOA: 07/13/2022 PCP: Joycelyn Man, NP  HPI/Recap of past 24 hours: [name] is a 88 y.o. female with medical history significant for paroxysmal supraventricular tachycardia, A-fib, chronic diastolic HF, sarcoid, HTN, CAD presents with worsening dyspnea on exertion, bilateral lower extremity edema for the past couple of weeks.  Patient is currently in an assisted living facility, [facility]. Discussed extensively with daughter at bedside who assisted with the history.  Reports dissatisfaction with [name], due to several issues including not giving patient needed medication.  Patient was supposed to be getting Lasix on a daily basis as per daughter, but was not receiving it as well as amiodarone.  In the ED, vital signs fairly stable, chest x-ray showed pulmonary vascular congestion with some mild elevation of BNP.  Patient given 1 dose of Lasix.  [facility] hospitalist called to admit patient for further management.      Today, denies any new complaints   Assessment/Plan: Principal Problem:   Acute on chronic diastolic (congestive) heart failure (HCC) Active Problems:   Atrial fibrillation (HCC)   GERD (gastroesophageal reflux disease)   SARCOIDOSIS, PULMONARY   COPD (chronic obstructive pulmonary disease) (HCC)   Acute on chronic diastolic HF BNP 332 Troponin 8 EKG with no acute ST changes Chest x-ray with pulmonary vascular congestion Echo done 4/24 showed EF of 55%, no regional wall motion abnormality, left ventricular diastolic parameters are indeterminate, pulmonary artery systolic pressure 30 mmHg BLE Doppler negative for DVT S/p IV Lasix, switch to PO lasix Strict I's and O's, daily weights  Hypokalemia Replace as needed   History of COPD History of sarcoid No noted wheezing or oxygen requirement Continue nebs, inhaler   History of dysphagia SLP consulted- rec  regular diet   History of paroxysmal A-fib EKG showing normal sinus rhythm, rate controlled Continue amiodarone, Eliquis   Hypertension BP soft  Anemia of chronic disease Iron deficiency anemia Vitamin B12 deficiency  Hemoglobin 9.3 on admission, baseline around 11-12 Anemia panel showed iron 15, sats 5, vitamin B12-->202 Completed IV iron x 4 doses--> switch to PO iron supplement Continue vitamin B12 supplementation Daily CBC   Pressure Injury 07/13/22 Buttocks Right Deep Tissue Pressure Injury - Purple or maroon localized area of discolored intact skin or blood-filled blister due to damage of underlying soft tissue from pressure and/or shear. 3 cm x 2 cm (Active)  07/13/22   Location: Buttocks  Location Orientation: Right  Staging: Deep Tissue Pressure Injury - Purple or maroon localized area of discolored intact skin or blood-filled blister due to damage of underlying soft tissue from pressure and/or shear.  Wound Description (Comments): 3 cm x 2 cm  Present on Admission: Yes  Dressing Type Foam - Lift dressing to assess site every shift 07/17/22 1449     Pressure Injury 07/13/22 Buttocks Left Deep Tissue Pressure Injury - Purple or maroon localized area of discolored intact skin or blood-filled blister due to damage of underlying soft tissue from pressure and/or shear. 2 cm x 3 cm (Active)  07/13/22   Location: Buttocks  Location Orientation: Left  Staging: Deep Tissue Pressure Injury - Purple or maroon localized area of discolored intact skin or blood-filled blister due to damage of underlying soft tissue from pressure and/or shear.  Wound Description (Comments): 2 cm x 3 cm  Present on Admission: Yes  Dressing Type Foam - Lift dressing to assess site every shift 07/17/22 1449  Pressure Injury 07/13/22 Heel Right Stage 1 -  Intact skin with non-blanchable redness of a localized area usually over a bony prominence. 1 cm x 1 cm (Active)  07/13/22   Location: Heel   Location Orientation: Right  Staging: Stage 1 -  Intact skin with non-blanchable redness of a localized area usually over a bony prominence.  Wound Description (Comments): 1 cm x 1 cm  Present on Admission: Yes  Dressing Type Foam - Lift dressing to assess site every shift 07/17/22 1449     Estimated body mass index is 21.91 kg/m as calculated from the following:   Height as of this encounter: 5\' 3"  (1.6 m).   Weight as of this encounter: 56.1 kg.     Code Status: DNR  Family Communication: None at bedside  Disposition Plan: Status is: Inpatient Remains inpatient appropriate because: Level of care      Consultants: None  Procedures: None  Antimicrobials: None  DVT prophylaxis: Eliquis   Objective: Vitals:   07/17/22 0500 07/17/22 0546 07/17/22 0855 07/17/22 1331  BP:  (!) 122/46  (!) 111/40  Pulse:  61  67  Resp:  18  16  Temp:  98.5 F (36.9 C)  98 F (36.7 C)  TempSrc:  Oral  Oral  SpO2:  95% 94% 98%  Weight: 56.1 kg     Height:        Intake/Output Summary (Last 24 hours) at 07/17/2022 1650 Last data filed at 07/17/2022 1627 Gross per 24 hour  Intake 1383.69 ml  Output 650 ml  Net 733.69 ml   Filed Weights   07/15/22 0437 07/16/22 0500 07/17/22 0500  Weight: 60 kg 55.8 kg 56.1 kg    Exam: General: NAD  Cardiovascular: S1, S2 present Respiratory: CTAB Abdomen: Soft, nontender, nondistended, bowel sounds present Musculoskeletal: No bilateral pedal edema noted Skin: Normal Psychiatry: Normal mood     Data Reviewed: CBC: Recent Labs  Lab 07/13/22 1440 07/14/22 0413 07/15/22 0427 07/16/22 0434 07/17/22 0430  WBC 7.5 6.2 8.2 7.3 7.5  NEUTROABS 5.2  --  5.7 4.2 4.1  HGB 9.3* 9.1* 9.6* 9.0* 8.9*  HCT 30.7* 28.5* 30.4* 28.4* 28.4*  MCV 88.5 88.0 87.4 90.2 90.2  PLT 309 314 341 328 340   Basic Metabolic Panel: Recent Labs  Lab 07/13/22 1440 07/14/22 0413 07/14/22 0436 07/15/22 0427 07/16/22 0434 07/17/22 0430  NA 137 137   --  135 133* 135  K 3.7 3.3*  --  4.0 3.8 4.0  CL 106 101  --  100 99 100  CO2 22 26  --  26 25 27   GLUCOSE 109* 99  --  115* 90 98  BUN 12 13  --  18 18 23   CREATININE 0.73 0.81  --  1.05* 0.80 0.90  CALCIUM 8.5* 8.2*  --  8.4* 8.6* 9.0  MG  --   --  1.7  --   --   --    GFR: Estimated Creatinine Clearance: 37.1 mL/min (by C-G formula based on SCr of 0.9 mg/dL). Liver Function Tests: Recent Labs  Lab 07/14/22 0413  AST 16  ALT 12  ALKPHOS 67  BILITOT 0.5  PROT 6.0*  ALBUMIN 3.1*   No results for input(s): "LIPASE", "AMYLASE" in the last 168 hours. No results for input(s): "AMMONIA" in the last 168 hours. Coagulation Profile: No results for input(s): "INR", "PROTIME" in the last 168 hours. Cardiac Enzymes: No results for input(s): "CKTOTAL", "CKMB", "CKMBINDEX", "TROPONINI" in the last  168 hours. BNP (last 3 results) No results for input(s): "PROBNP" in the last 8760 hours. HbA1C: No results for input(s): "HGBA1C" in the last 72 hours. CBG: No results for input(s): "GLUCAP" in the last 168 hours. Lipid Profile: No results for input(s): "CHOL", "HDL", "LDLCALC", "TRIG", "CHOLHDL", "LDLDIRECT" in the last 72 hours. Thyroid Function Tests: No results for input(s): "TSH", "T4TOTAL", "FREET4", "T3FREE", "THYROIDAB" in the last 72 hours. Anemia Panel: No results for input(s): "VITAMINB12", "FOLATE", "FERRITIN", "TIBC", "IRON", "RETICCTPCT" in the last 72 hours.  Urine analysis:    Component Value Date/Time   COLORURINE AMBER (A) 04/18/2022 2122   APPEARANCEUR CLEAR 04/18/2022 2122   LABSPEC 1.018 04/18/2022 2122   PHURINE 5.0 04/18/2022 2122   GLUCOSEU NEGATIVE 04/18/2022 2122   HGBUR NEGATIVE 04/18/2022 2122   BILIRUBINUR NEGATIVE 04/18/2022 2122   BILIRUBINUR negative 03/21/2018 1328   KETONESUR 5 (A) 04/18/2022 2122   PROTEINUR NEGATIVE 04/18/2022 2122   UROBILINOGEN negative (A) 03/21/2018 1328   UROBILINOGEN 0.2 12/08/2008 1204   NITRITE NEGATIVE 04/18/2022  2122   LEUKOCYTESUR NEGATIVE 04/18/2022 2122   Sepsis Labs: @LABRCNTIP (procalcitonin:4,lacticidven:4)  )No results found for this or any previous visit (from the past 240 hour(s)).    Studies: No results found.  Scheduled Meds:  amiodarone  100 mg Oral Daily   apixaban  2.5 mg Oral BID   vitamin B-12  1,000 mcg Oral Daily   furosemide  40 mg Oral Daily   umeclidinium bromide  1 puff Inhalation Daily   And   mometasone-formoterol  2 puff Inhalation BID   pantoprazole  40 mg Oral Daily   polyethylene glycol  17 g Oral Daily   sertraline  50 mg Oral QHS    Continuous Infusions:     LOS: 4 days     Briant Cedar, MD [facility]  If 7PM-7AM, please contact night-coverage [email or web address] 07/17/2022, 4:50 PM

## 2022-07-17 NOTE — Plan of Care (Signed)
  Problem: Education: Goal: Ability to demonstrate management of disease process will improve Outcome: Progressing Goal: Ability to verbalize understanding of medication therapies will improve Outcome: Progressing Goal: Individualized Educational Video(s) Outcome: Progressing   Problem: Activity: Goal: Capacity to carry out activities will improve Outcome: Progressing   Problem: Cardiac: Goal: Ability to achieve and maintain adequate cardiopulmonary perfusion will improve Outcome: Progressing   Problem: Education: Goal: Knowledge of General Education information will improve Description: Including pain rating scale, medication(s)/side effects and non-pharmacologic comfort measures Outcome: Progressing   Problem: Health Behavior/Discharge Planning: Goal: Ability to manage health-related needs will improve Outcome: Progressing   Problem: Clinical Measurements: Goal: Ability to maintain clinical measurements within normal limits will improve Outcome: Progressing Goal: Will remain free from infection Outcome: Progressing Goal: Diagnostic test results will improve Outcome: Progressing Goal: Respiratory complications will improve Outcome: Progressing Goal: Cardiovascular complication will be avoided Outcome: Progressing   Problem: Activity: Goal: Risk for activity intolerance will decrease Outcome: Progressing   Problem: Nutrition: Goal: Adequate nutrition will be maintained Outcome: Progressing   Problem: Elimination: Goal: Will not experience complications related to urinary retention Outcome: Progressing   Problem: Pain Managment: Goal: General experience of comfort will improve Outcome: Progressing   Problem: Safety: Goal: Ability to remain free from injury will improve Outcome: Progressing   Problem: Skin Integrity: Goal: Risk for impaired skin integrity will decrease Outcome: Progressing

## 2022-07-18 DIAGNOSIS — I5033 Acute on chronic diastolic (congestive) heart failure: Secondary | ICD-10-CM | POA: Diagnosis not present

## 2022-07-18 LAB — CBC WITH DIFFERENTIAL/PLATELET
Abs Immature Granulocytes: 0.36 10*3/uL — ABNORMAL HIGH (ref 0.00–0.07)
Basophils Absolute: 0.1 10*3/uL (ref 0.0–0.1)
Basophils Relative: 1 %
Eosinophils Absolute: 0.3 10*3/uL (ref 0.0–0.5)
Eosinophils Relative: 3 %
HCT: 28.2 % — ABNORMAL LOW (ref 36.0–46.0)
Hemoglobin: 9 g/dL — ABNORMAL LOW (ref 12.0–15.0)
Immature Granulocytes: 4 %
Lymphocytes Relative: 24 %
Lymphs Abs: 2 10*3/uL (ref 0.7–4.0)
MCH: 30 pg (ref 26.0–34.0)
MCHC: 31.9 g/dL (ref 30.0–36.0)
MCV: 94 fL (ref 80.0–100.0)
Monocytes Absolute: 0.8 10*3/uL (ref 0.1–1.0)
Monocytes Relative: 9 %
Neutro Abs: 4.8 10*3/uL (ref 1.7–7.7)
Neutrophils Relative %: 59 %
Platelets: 342 10*3/uL (ref 150–400)
RBC: 3 MIL/uL — ABNORMAL LOW (ref 3.87–5.11)
RDW: 15.4 % (ref 11.5–15.5)
WBC: 8.3 10*3/uL (ref 4.0–10.5)
nRBC: 0 % (ref 0.0–0.2)

## 2022-07-18 LAB — BASIC METABOLIC PANEL
Anion gap: 9 (ref 5–15)
BUN: 23 mg/dL (ref 8–23)
CO2: 25 mmol/L (ref 22–32)
Calcium: 8.8 mg/dL — ABNORMAL LOW (ref 8.9–10.3)
Chloride: 105 mmol/L (ref 98–111)
Creatinine, Ser: 0.81 mg/dL (ref 0.44–1.00)
GFR, Estimated: >60 mL/min (ref >60)
Glucose, Bld: 93 mg/dL (ref 70–99)
Potassium: 4.1 mmol/L (ref 3.5–5.1)
Sodium: 139 mmol/L (ref 135–145)

## 2022-07-18 NOTE — Care Management Important Message (Signed)
Important Message  Patient Details IM Letter given. Name: [name] MRN: [id number] Date of Birth: May 23, 1934   Medicare Important Message Given:  Yes     Caren Macadam 07/18/2022, 2:26 PM

## 2022-07-18 NOTE — Plan of Care (Signed)
  Problem: Activity: Goal: Capacity to carry out activities will improve Outcome: Progressing   Problem: Education: Goal: Knowledge of General Education information will improve Description: Including pain rating scale, medication(s)/side effects and non-pharmacologic comfort measures Outcome: Progressing   Problem: Health Behavior/Discharge Planning: Goal: Ability to manage health-related needs will improve Outcome: Progressing   Problem: Activity: Goal: Risk for activity intolerance will decrease Outcome: Progressing   Problem: Nutrition: Goal: Adequate nutrition will be maintained Outcome: Progressing   

## 2022-07-18 NOTE — Progress Notes (Signed)
PROGRESS NOTE  [name] GNF:[id number] DOB: May 06, 1934 DOA: 07/13/2022 PCP: Joycelyn Man, NP  HPI/Recap of past 24 hours: [name] is a 88 y.o. female with medical history significant for paroxysmal supraventricular tachycardia, A-fib, chronic diastolic HF, sarcoid, HTN, CAD presents with worsening dyspnea on exertion, bilateral lower extremity edema for the past couple of weeks.  Patient is currently in an assisted living facility, [facility]. Discussed extensively with daughter at bedside who assisted with the history.  Reports dissatisfaction with [name], due to several issues including not giving patient needed medication.  Patient was supposed to be getting Lasix on a daily basis as per daughter, but was not receiving it as well as amiodarone.  In the ED, vital signs fairly stable, chest x-ray showed pulmonary vascular congestion with some mild elevation of BNP.  Patient given 1 dose of Lasix.  [facility] hospitalist called to admit patient for further management.      Today, patient denies any new complaints.    Assessment/Plan: Principal Problem:   Acute on chronic diastolic (congestive) heart failure (HCC) Active Problems:   Atrial fibrillation (HCC)   GERD (gastroesophageal reflux disease)   SARCOIDOSIS, PULMONARY   COPD (chronic obstructive pulmonary disease) (HCC)   Acute on chronic diastolic HF BNP 332 Troponin 8 EKG with no acute ST changes Chest x-ray with pulmonary vascular congestion Echo done 4/24 showed EF of 55%, no regional wall motion abnormality, left ventricular diastolic parameters are indeterminate, pulmonary artery systolic pressure 30 mmHg BLE Doppler negative for DVT S/p IV Lasix, switch to PO lasix Strict I's and O's, daily weights  Hypokalemia Replace as needed   History of COPD History of sarcoid No noted wheezing or oxygen requirement Continue nebs, inhaler   History of dysphagia SLP  consulted- rec regular diet   History of paroxysmal A-fib EKG showing normal sinus rhythm, rate controlled Continue amiodarone, Eliquis   Hypertension BP soft  Anemia of chronic disease Iron deficiency anemia Vitamin B12 deficiency  Hemoglobin 9.3 on admission, baseline around 11-12 Anemia panel showed iron 15, sats 5, vitamin B12-->202 Completed IV iron x 4 doses--> switch to PO iron supplement Continue vitamin B12 supplementation Daily CBC   Pressure Injury 07/13/22 Buttocks Right Deep Tissue Pressure Injury - Purple or maroon localized area of discolored intact skin or blood-filled blister due to damage of underlying soft tissue from pressure and/or shear. 3 cm x 2 cm (Active)  07/13/22   Location: Buttocks  Location Orientation: Right  Staging: Deep Tissue Pressure Injury - Purple or maroon localized area of discolored intact skin or blood-filled blister due to damage of underlying soft tissue from pressure and/or shear.  Wound Description (Comments): 3 cm x 2 cm  Present on Admission: Yes  Dressing Type Foam - Lift dressing to assess site every shift;Other (Comment) 07/18/22 0810     Pressure Injury 07/13/22 Buttocks Left Deep Tissue Pressure Injury - Purple or maroon localized area of discolored intact skin or blood-filled blister due to damage of underlying soft tissue from pressure and/or shear. 2 cm x 3 cm (Active)  07/13/22   Location: Buttocks  Location Orientation: Left  Staging: Deep Tissue Pressure Injury - Purple or maroon localized area of discolored intact skin or blood-filled blister due to damage of underlying soft tissue from pressure and/or shear.  Wound Description (Comments): 2 cm x 3 cm  Present on Admission: Yes  Dressing Type Foam - Lift dressing to assess site every shift 07/18/22 0810  Pressure Injury 07/13/22 Heel Right Stage 1 -  Intact skin with non-blanchable redness of a localized area usually over a bony prominence. 1 cm x 1 cm (Active)   07/13/22   Location: Heel  Location Orientation: Right  Staging: Stage 1 -  Intact skin with non-blanchable redness of a localized area usually over a bony prominence.  Wound Description (Comments): 1 cm x 1 cm  Present on Admission: Yes  Dressing Type Foam - Lift dressing to assess site every shift 07/18/22 0810     Estimated body mass index is 22.38 kg/m as calculated from the following:   Height as of this encounter: 5\' 3"  (1.6 m).   Weight as of this encounter: 57.3 kg.     Code Status: DNR  Family Communication: None at bedside  Disposition Plan: Status is: Inpatient Remains inpatient appropriate because: Level of care      Consultants: None  Procedures: None  Antimicrobials: None  DVT prophylaxis: Eliquis   Objective: Vitals:   07/18/22 0445 07/18/22 0458 07/18/22 0600 07/18/22 1423  BP: (!) 122/47   (!) 122/45  Pulse: 64   64  Resp: 15  15 16   Temp: 98 F (36.7 C)   98.4 F (36.9 C)  TempSrc: Oral   Oral  SpO2: 96%   98%  Weight:  57.3 kg    Height:        Intake/Output Summary (Last 24 hours) at 07/18/2022 1604 Last data filed at 07/18/2022 1531 Gross per 24 hour  Intake 1983.69 ml  Output 2275 ml  Net -291.31 ml   Filed Weights   07/16/22 0500 07/17/22 0500 07/18/22 0458  Weight: 55.8 kg 56.1 kg 57.3 kg    Exam: General: NAD  Cardiovascular: S1, S2 present Respiratory: CTAB Abdomen: Soft, nontender, nondistended, bowel sounds present Musculoskeletal: No bilateral pedal edema noted Skin: Normal Psychiatry: Normal mood     Data Reviewed: CBC: Recent Labs  Lab 07/13/22 1440 07/14/22 0413 07/15/22 0427 07/16/22 0434 07/17/22 0430 07/18/22 0352  WBC 7.5 6.2 8.2 7.3 7.5 8.3  NEUTROABS 5.2  --  5.7 4.2 4.1 4.8  HGB 9.3* 9.1* 9.6* 9.0* 8.9* 9.0*  HCT 30.7* 28.5* 30.4* 28.4* 28.4* 28.2*  MCV 88.5 88.0 87.4 90.2 90.2 94.0  PLT 309 314 341 328 340 342   Basic Metabolic Panel: Recent Labs  Lab 07/14/22 0413 07/14/22 0436  07/15/22 0427 07/16/22 0434 07/17/22 0430 07/18/22 0352  NA 137  --  135 133* 135 139  K 3.3*  --  4.0 3.8 4.0 4.1  CL 101  --  100 99 100 105  CO2 26  --  26 25 27 25   GLUCOSE 99  --  115* 90 98 93  BUN 13  --  18 18 23 23   CREATININE 0.81  --  1.05* 0.80 0.90 0.81  CALCIUM 8.2*  --  8.4* 8.6* 9.0 8.8*  MG  --  1.7  --   --   --   --    GFR: Estimated Creatinine Clearance: 41.2 mL/min (by C-G formula based on SCr of 0.81 mg/dL). Liver Function Tests: Recent Labs  Lab 07/14/22 0413  AST 16  ALT 12  ALKPHOS 67  BILITOT 0.5  PROT 6.0*  ALBUMIN 3.1*   No results for input(s): "LIPASE", "AMYLASE" in the last 168 hours. No results for input(s): "AMMONIA" in the last 168 hours. Coagulation Profile: No results for input(s): "INR", "PROTIME" in the last 168 hours. Cardiac Enzymes: No results for  input(s): "CKTOTAL", "CKMB", "CKMBINDEX", "TROPONINI" in the last 168 hours. BNP (last 3 results) No results for input(s): "PROBNP" in the last 8760 hours. HbA1C: No results for input(s): "HGBA1C" in the last 72 hours. CBG: No results for input(s): "GLUCAP" in the last 168 hours. Lipid Profile: No results for input(s): "CHOL", "HDL", "LDLCALC", "TRIG", "CHOLHDL", "LDLDIRECT" in the last 72 hours. Thyroid Function Tests: No results for input(s): "TSH", "T4TOTAL", "FREET4", "T3FREE", "THYROIDAB" in the last 72 hours. Anemia Panel: No results for input(s): "VITAMINB12", "FOLATE", "FERRITIN", "TIBC", "IRON", "RETICCTPCT" in the last 72 hours.  Urine analysis:    Component Value Date/Time   COLORURINE AMBER (A) 04/18/2022 2122   APPEARANCEUR CLEAR 04/18/2022 2122   LABSPEC 1.018 04/18/2022 2122   PHURINE 5.0 04/18/2022 2122   GLUCOSEU NEGATIVE 04/18/2022 2122   HGBUR NEGATIVE 04/18/2022 2122   BILIRUBINUR NEGATIVE 04/18/2022 2122   BILIRUBINUR negative 03/21/2018 1328   KETONESUR 5 (A) 04/18/2022 2122   PROTEINUR NEGATIVE 04/18/2022 2122   UROBILINOGEN negative (A) 03/21/2018  1328   UROBILINOGEN 0.2 12/08/2008 1204   NITRITE NEGATIVE 04/18/2022 2122   LEUKOCYTESUR NEGATIVE 04/18/2022 2122   Sepsis Labs: @LABRCNTIP (procalcitonin:4,lacticidven:4)  )No results found for this or any previous visit (from the past 240 hour(s)).    Studies: No results found.  Scheduled Meds:  amiodarone  100 mg Oral Daily   apixaban  2.5 mg Oral BID   vitamin B-12  1,000 mcg Oral Daily   ferrous gluconate  324 mg Oral Q breakfast   furosemide  40 mg Oral Daily   umeclidinium bromide  1 puff Inhalation Daily   And   mometasone-formoterol  2 puff Inhalation BID   pantoprazole  40 mg Oral Daily   polyethylene glycol  17 g Oral Daily   sertraline  50 mg Oral QHS    Continuous Infusions:     LOS: 5 days     Briant Cedar, MD [facility]  If 7PM-7AM, please contact night-coverage [email or web address] 07/18/2022, 4:04 PM

## 2022-07-18 NOTE — Progress Notes (Signed)
Occupational Therapy Treatment Patient Details Name: [name] MRN: [id number] DOB: December 24, 1934 Today's Date: 07/18/2022   History of present illness [name] is a 88 y.o. female admitted with acute on chronic diastolic heart failure. PMH: paroxysmal supraventricular tachycardia, A-fib, chronic diastolic HF, sarcoid, HTN, CAD   OT comments  Patient was able to improve participation in ADLs during session compared to evaluation. Patient is pleasant and cooperative with continued decreased standing balance and balance impacting participation in ADLs. Patient's discharge plan remains appropriate at this time. OT will continue to follow acutely.     Recommendations for follow up therapy are one component of a multi-disciplinary discharge planning process, led by the attending physician.  Recommendations may be updated based on patient status, additional functional criteria and insurance authorization.    Assistance Recommended at Discharge Frequent or constant Supervision/Assistance  Patient can return home with the following  A lot of help with bathing/dressing/bathroom;A lot of help with walking and/or transfers   Equipment Recommendations  None recommended by OT       Precautions / Restrictions Precautions Precautions: Fall Restrictions Weight Bearing Restrictions: No Other Position/Activity Restrictions: chronic R knee pain from arthritis and baker's cyst       Mobility Bed Mobility Overal bed mobility: Needs Assistance Bed Mobility: Supine to Sit     Supine to sit: Min assist     General bed mobility comments: with increased A       Balance Overall balance assessment: Needs assistance   Sitting balance-Leahy Scale: Fair     Standing balance support: Reliant on assistive device for balance, During functional activity, Bilateral upper extremity supported Standing balance-Leahy Scale: Poor           ADL either performed or assessed with clinical  judgement   ADL Overall ADL's : Needs assistance/impaired     Grooming: Sitting;Set up   Upper Body Bathing: Set up;Sitting       Upper Body Dressing : Minimal assistance;Sitting       Toilet Transfer: Moderate assistance;Stand-pivot;Rolling walker (2 wheels) Toilet Transfer Details (indicate cue type and reason): to [facility] then to recliner with 3 sit to stands to complete hygiene tasks. Toileting- Clothing Manipulation and Hygiene: Sit to/from stand;Maximal assistance Toileting - Clothing Manipulation Details (indicate cue type and reason): patient attempted to help in sitting but increased loose stool needing increased A to complete task with multiple sit to stands with incontinence of urine impacting standing.              Cognition Arousal/Alertness: Awake/alert Behavior During Therapy: WFL for tasks assessed/performed Overall Cognitive Status: Within Functional Limits for tasks assessed           General Comments: plesant and follows commands, very appologetic for needing assistance with ADls                   Pertinent Vitals/ Pain       Pain Assessment Pain Assessment: Faces Faces Pain Scale: Hurts even more Pain Location: R knee Pain Descriptors / Indicators: Aching, Sore Pain Intervention(s): Limited activity within patient's tolerance, Monitored during session   Frequency  Min 1X/week        Progress Toward Goals  OT Goals(current goals can now be found in the care plan section)  Progress towards OT goals: Progressing toward goals     Plan Discharge plan remains appropriate       AM-PAC OT "6 Clicks" Daily Activity     Outcome Measure  Help from another person eating meals?: None Help from another person taking care of personal grooming?: A Little Help from another person toileting, which includes using toliet, bedpan, or urinal?: A Lot Help from another person bathing (including washing, rinsing, drying)?: A Lot Help from another  person to put on and taking off regular upper body clothing?: A Little Help from another person to put on and taking off regular lower body clothing?: A Lot 6 Click Score: 16    End of Session Equipment Utilized During Treatment: Gait belt;Rolling walker (2 wheels)  OT Visit Diagnosis: Unsteadiness on feet (R26.81);Muscle weakness (generalized) (M62.81)   Activity Tolerance Patient tolerated treatment well   Patient Left in chair;with call bell/phone within reach;with chair alarm set;with family/visitor present   Nurse Communication Mobility status        Time: 1610-9604 OT Time Calculation (min): 31 min  Charges: OT General Charges $OT Visit: 1 Visit OT Treatments $Self Care/Home Management : 23-37 mins  Sherri Loud, MS Acute Rehabilitation Department Office# [phone]   [name] 07/18/2022, 10:43 AM

## 2022-07-19 DIAGNOSIS — I5033 Acute on chronic diastolic (congestive) heart failure: Secondary | ICD-10-CM | POA: Diagnosis not present

## 2022-07-19 LAB — CBC WITH DIFFERENTIAL/PLATELET
Abs Immature Granulocytes: 0.37 10*3/uL — ABNORMAL HIGH (ref 0.00–0.07)
Basophils Absolute: 0.1 10*3/uL (ref 0.0–0.1)
Basophils Relative: 1 %
Eosinophils Absolute: 0.2 10*3/uL (ref 0.0–0.5)
Eosinophils Relative: 3 %
HCT: 30 % — ABNORMAL LOW (ref 36.0–46.0)
Hemoglobin: 9.3 g/dL — ABNORMAL LOW (ref 12.0–15.0)
Immature Granulocytes: 5 %
Lymphocytes Relative: 27 %
Lymphs Abs: 2.2 10*3/uL (ref 0.7–4.0)
MCH: 28.4 pg (ref 26.0–34.0)
MCHC: 31 g/dL (ref 30.0–36.0)
MCV: 91.5 fL (ref 80.0–100.0)
Monocytes Absolute: 0.8 10*3/uL (ref 0.1–1.0)
Monocytes Relative: 9 %
Neutro Abs: 4.4 10*3/uL (ref 1.7–7.7)
Neutrophils Relative %: 55 %
Platelets: 343 10*3/uL (ref 150–400)
RBC: 3.28 MIL/uL — ABNORMAL LOW (ref 3.87–5.11)
RDW: 15.8 % — ABNORMAL HIGH (ref 11.5–15.5)
WBC: 8.1 10*3/uL (ref 4.0–10.5)
nRBC: 0 % (ref 0.0–0.2)

## 2022-07-19 LAB — BASIC METABOLIC PANEL
Anion gap: 8 (ref 5–15)
BUN: 22 mg/dL (ref 8–23)
CO2: 26 mmol/L (ref 22–32)
Calcium: 8.9 mg/dL (ref 8.9–10.3)
Chloride: 102 mmol/L (ref 98–111)
Creatinine, Ser: 0.91 mg/dL (ref 0.44–1.00)
GFR, Estimated: >60 mL/min (ref >60)
Glucose, Bld: 91 mg/dL (ref 70–99)
Potassium: 4 mmol/L (ref 3.5–5.1)
Sodium: 136 mmol/L (ref 135–145)

## 2022-07-19 MED ORDER — FERROUS GLUCONATE 324 (38 FE) MG PO TABS: 324.0000 mg | ORAL_TABLET | Freq: Every day | ORAL | 3 refills | Status: DC

## 2022-07-19 MED ORDER — CYANOCOBALAMIN 1000 MCG PO TABS: 1000.0000 ug | ORAL_TABLET | Freq: Every day | ORAL | 0 refills | Status: AC

## 2022-07-19 MED ORDER — FUROSEMIDE 20 MG PO TABS: 20.0000 mg | ORAL_TABLET | Freq: Every day | ORAL | Status: DC

## 2022-07-19 NOTE — Discharge Summary (Signed)
Physician Discharge Summary   Patient: [name] MRN: [id number] DOB: [birth year]  Admit date:     07/13/2022  Discharge date: 07/19/22  Discharge Physician: Briant Cedar   PCP: Joycelyn Man, NP   Recommendations at discharge:   Follow-up with PCP in 1 week  Discharge Diagnoses: Principal Problem:   Acute on chronic diastolic (congestive) heart failure (HCC) Active Problems:   Atrial fibrillation (HCC)   GERD (gastroesophageal reflux disease)   SARCOIDOSIS, PULMONARY   COPD (chronic obstructive pulmonary disease) [facility])    Hospital Course: [name] is a 88 y.o. female with medical history significant for paroxysmal supraventricular tachycardia, A-fib, chronic diastolic HF, sarcoid, HTN, CAD presents with worsening dyspnea on exertion, bilateral lower extremity edema for the past couple of weeks.  Patient is currently in an assisted living facility, [facility]. Discussed extensively with daughter at bedside who assisted with the history.  Reports dissatisfaction with [name] assisted living facility, due to several issues including not giving patient needed medication.  Patient was supposed to be getting Lasix on a daily basis as per daughter, but was not receiving it as well as amiodarone.  In the ED, vital signs fairly stable, chest x-ray showed pulmonary vascular congestion with some mild elevation of BNP.  Patient given 1 dose of Lasix.  [facility] hospital[facility] called to admit patient for further management.     Today, pt denies any new complaints   Assessment and Plan:  Acute on chronic diastolic HF BNP 332 Troponin 8 EKG with no acute ST changes Chest x-ray with pulmonary vascular congestion Echo done 4/24 showed EF of 55%, no regional wall motion abnormality, left ventricular diastolic parameters are indeterminate, pulmonary artery systolic pressure 30 mmHg BLE Doppler negative for DVT S/p IV Lasix, switch to PO lasix 20 mg daily    Hypokalemia Replaced as needed   History of COPD History of sarcoid No noted wheezing or oxygen requirement Continue nebs, inhaler   History of dysphagia SLP consulted- rec regular diet   History of paroxysmal A-fib EKG showing normal sinus rhythm, rate controlled Continue amiodarone, Eliquis   Hypertension BP stable   Anemia of chronic disease Iron deficiency anemia Vitamin B12 deficiency         Hemoglobin 9.3 on admission, baseline around 11-12 Anemia panel showed iron 15, sats 5, vitamin B12-->202 Completed IV iron x 4 doses--> switch to PO iron supplement Continue vitamin B12 supplementation       Consultants: None Procedures performed: None Disposition: Skilled nursing facility Diet recommendation:  Regular diet   DISCHARGE MEDICATION: Allergies as of 07/19/2022       Reactions   Levaquin [levofloxacin] Other (See Comments)   Joint pain.. Per doctor, told to not to take again and "allergic," per [facility]   Oxycodone Nausea And Vomiting, Other (See Comments)   Can take oxycodone with Zofran (otherwise, GI Intolerance)   Oxycodone-acetaminophen Other (See Comments)   GI Intolerance and "talking out of my head" and "allergic," per [facility]   Sulfonamide Derivatives Other (See Comments)   Possibly caused hepatitis and "allergic," per [facility]   Sulfa Antibiotics Other (See Comments)   Possibly caused hepatitis and "allergic," per [facility]   Morphine Nausea Only, Other (See Comments)   GI intolerance and "allergic," per [facility]        Medication List     TAKE these medications    acetaminophen 500 MG tablet Commonly known as: TYLENOL Take 1,000 mg by mouth every 6 (six) hours as needed (  for pain).   amiodarone 100 MG tablet Commonly known as: PACERONE Take 1 tablet (100 mg total) by mouth daily.   Breztri Aerosphere 160-9-4.8 MCG/ACT Aero Generic drug: Budeson-Glycopyrrol-Formoterol Inhale 2 puffs into the lungs in the morning and at bedtime.   cyanocobalamin 1000  MCG tablet Take 1 tablet (1,000 mcg total) by mouth daily. Start taking on: Jul 20, 2022   Eliquis 2.5 MG Tabs tablet Generic drug: apixaban TAKE 1 TABLET(2.5 MG) BY MOUTH TWICE DAILY What changed: See the new instructions.   ferrous gluconate 324 MG tablet Commonly known as: FERGON Take 1 tablet (324 mg total) by mouth daily with breakfast. Start taking on: Jul 20, 2022   furosemide 20 MG tablet Commonly known as: LASIX Take 1 tablet (20 mg total) by mouth daily. Start taking on: Jul 20, 2022 What changed:  how much to take how to take this when to take this additional instructions   guaiFENesin 600 MG 12 hr tablet Commonly known as: MUCINEX Take 2 tablets (1,200 mg total) by mouth 2 (two) times daily.   ipratropium-albuterol 0.5-2.5 (3) MG/3ML Soln Commonly known as: DUONEB Take 3 mLs by nebulization every 6 (six) hours as needed (wheezing; shortness of breath).   omeprazole 40 MG capsule Commonly known as: PRILOSEC Take 40 mg by mouth in the morning and at bedtime.   polyethylene glycol 17 g packet Commonly known as: MIRALAX / GLYCOLAX Take 17 g by mouth daily as needed for mild constipation.   sertraline 50 MG tablet Commonly known as: ZOLOFT Take 1 tablet (50 mg total) by mouth at bedtime.   TUMS ULTRA 1000 PO Take 1,000 mg by mouth every 12 (twelve) hours as needed (acid reflux/indigestion/heartburn).        Follow-up Information     Joycelyn Man, NP. Schedule an appointment as soon as possible for a visit in 1 week(s).   Specialty: Nurse Practitioner Contact information: [address] [place] [address] [phone]                Discharge Exam: Filed Weights   07/17/22 0500 07/18/22 0458 07/19/22 0449  Weight: 56.1 kg 57.3 kg 58.5 kg   General: NAD  Cardiovascular: S1, S2 present Respiratory: CTAB Abdomen: Soft, nontender, nondistended, bowel sounds present Musculoskeletal: No bilateral pedal edema  noted Skin: Normal Psychiatry: Normal mood   Condition at discharge: stable  The results of significant diagnostics from this hospitalization (including imaging, microbiology, ancillary and laboratory) are listed below for reference.   Imaging Studies: VAS [place] LOWER EXTREMITY VENOUS (DVT)  Result Date: 07/14/2022  Lower Venous DVT Study Patient Name:  [name]  Date of Exam:   07/14/2022 Medical Rec #: [id number]         Accession #:    [id number] Date of Birth: [birth year]         Patient Gender: F Patient Age:   69 years Exam Location:  [facility] Procedure:      VAS [place] LOWER EXTREMITY VENOUS (DVT) Referring Phys: [facility] Stephenie Navejas --------------------------------------------------------------------------------  Indications: Swelling.  Risk Factors: None identified. Limitations: Poor ultrasound/tissue interface. Comparison Study: No prior studies. Performing Technologist: Chanda Busing RVT  Examination Guidelines: A complete evaluation includes B-mode imaging, spectral Doppler, color Doppler, and power Doppler as needed of all accessible portions of each vessel. Bilateral testing is considered an integral part of a complete examination. Limited examinations for reoccurring indications may be performed as noted. The reflux portion of the exam is  performed with the patient in reverse Trendelenburg.  +---------+---------------+---------+-----------+----------+--------------+ RIGHT    CompressibilityPhasicitySpontaneityPropertiesThrombus Aging +---------+---------------+---------+-----------+----------+--------------+ CFV      Full           Yes      Yes                                 +---------+---------------+---------+-----------+----------+--------------+ SFJ      Full                                                        +---------+---------------+---------+-----------+----------+--------------+ FV Prox  Full                                                         +---------+---------------+---------+-----------+----------+--------------+ FV Mid   Full                                                        +---------+---------------+---------+-----------+----------+--------------+ FV DistalFull                                                        +---------+---------------+---------+-----------+----------+--------------+ PFV      Full                                                        +---------+---------------+---------+-----------+----------+--------------+ POP      Full           Yes      Yes                                 +---------+---------------+---------+-----------+----------+--------------+ PTV      Full                                                        +---------+---------------+---------+-----------+----------+--------------+ PERO     Full                                                        +---------+---------------+---------+-----------+----------+--------------+   +---------+---------------+---------+-----------+----------+--------------+ LEFT     CompressibilityPhasicitySpontaneityPropertiesThrombus Aging +---------+---------------+---------+-----------+----------+--------------+ CFV      Full           Yes      Yes                                 +---------+---------------+---------+-----------+----------+--------------+  SFJ      Full                                                        +---------+---------------+---------+-----------+----------+--------------+ FV Prox  Full                                                        +---------+---------------+---------+-----------+----------+--------------+ FV Mid   Full                                                        +---------+---------------+---------+-----------+----------+--------------+ FV DistalFull                                                         +---------+---------------+---------+-----------+----------+--------------+ PFV      Full                                                        +---------+---------------+---------+-----------+----------+--------------+ POP      Full           Yes      Yes                                 +---------+---------------+---------+-----------+----------+--------------+ PTV      Full                                                        +---------+---------------+---------+-----------+----------+--------------+ PERO     Full                                                        +---------+---------------+---------+-----------+----------+--------------+     Summary: RIGHT: - There is no evidence of deep vein thrombosis in the lower extremity.  - No cystic structure found in the popliteal fossa.  LEFT: - There is no evidence of deep vein thrombosis in the lower extremity.  - No cystic structure found in the popliteal fossa.  *See table(s) above for measurements and observations. Electronically signed by Heath Lark on 07/14/2022 at 5:44:49 PM.    Final    DG Chest 2 View  Result Date: 07/13/2022 CLINICAL DATA:  Short of breath, bilateral lower extremity edema, chest tightness EXAM: CHEST - 2 VIEW COMPARISON:  09/20/2022 FINDINGS: Frontal and lateral views  of the chest demonstrate stable enlargement of the cardiac silhouette. The lungs are hyperinflated. There is increased pulmonary vascular congestion, with small bilateral pleural effusions. No acute airspace disease or pneumothorax. Stable left shoulder arthroplasty. IMPRESSION: 1. Enlarged cardiac silhouette, with increased pulmonary vascular congestion. No overt edema. 2. Small bilateral pleural effusions. 3. Hyperinflation of the lungs. Electronically Signed   By: Sharlet Salina M.D.   On: 07/13/2022 15:58    Microbiology: Results for orders placed or performed during the hospital encounter of 04/18/22  Resp panel by RT-PCR (RSV,  Flu A&B, Covid) Anterior Nasal Swab     Status: None   Collection Time: 04/18/22  4:33 PM   Specimen: Anterior Nasal Swab  Result Value Ref Range Status   SARS Coronavirus 2 by RT PCR NEGATIVE NEGATIVE Final    Comment: (NOTE) SARS-CoV-2 target nucleic acids are NOT DETECTED.  The SARS-CoV-2 RNA is generally detectable in upper respiratory specimens during the acute phase of infection. The lowest concentration of SARS-CoV-2 viral copies this assay can detect is 138 copies/mL. A negative result does not preclude SARS-Cov-2 infection and should not be used as the sole basis for treatment or other patient management decisions. A negative result may occur with  improper specimen collection/handling, submission of specimen other than nasopharyngeal swab, presence of viral mutation(s) within the areas targeted by this assay, and inadequate number of viral copies(<138 copies/mL). A negative result must be combined with clinical observations, patient history, and epidemiological information. The expected result is Negative.  Fact Sheet for Patients:  BloggerCourse.com  Fact Sheet for Healthcare Providers:  SeriousBroker.it  This test is no t yet approved or cleared by the [place] FDA and  has been authorized for detection and/or diagnosis of SARS-CoV-2 by FDA under an Emergency Use Authorization (EUA). This EUA will remain  in effect (meaning this test can be used) for the duration of the COVID-19 declaration under Section 564(b)(1) of the Act, 21 U.S.C.section 360bbb-3(b)(1), unless the authorization is terminated  or revoked sooner.       Influenza A by PCR NEGATIVE NEGATIVE Final   Influenza B by PCR NEGATIVE NEGATIVE Final    Comment: (NOTE) The Xpert Xpress SARS-CoV-2/FLU/RSV plus assay is intended as an aid in the diagnosis of influenza from Nasopharyngeal swab specimens and should not be used as a sole basis for  treatment. Nasal washings and aspirates are unacceptable for Xpert Xpress SARS-CoV-2/FLU/RSV testing.  Fact Sheet for Patients: BloggerCourse.com  Fact Sheet for Healthcare Providers: SeriousBroker.it  This test is not yet approved or cleared by the [place] FDA and has been authorized for detection and/or diagnosis of SARS-CoV-2 by FDA under an Emergency Use Authorization (EUA). This EUA will remain in effect (meaning this test can be used) for the duration of the COVID-19 declaration under Section 564(b)(1) of the Act, 21 U.S.C. section 360bbb-3(b)(1), unless the authorization is terminated or revoked.     Resp Syncytial Virus by PCR NEGATIVE NEGATIVE Final    Comment: (NOTE) Fact Sheet for Patients: BloggerCourse.com  Fact Sheet for Healthcare Providers: SeriousBroker.it  This test is not yet approved or cleared by the [place] FDA and has been authorized for detection and/or diagnosis of SARS-CoV-2 by FDA under an Emergency Use Authorization (EUA). This EUA will remain in effect (meaning this test can be used) for the duration of the COVID-19 declaration under Section 564(b)(1) of the Act, 21 U.S.C. section 360bbb-3(b)(1), unless the authorization is terminated or revoked.  Performed at [facility], [address], [place], [place] [address]   MRSA Next Gen by PCR, Nasal     Status: None   Collection Time: 04/19/22  7:01 PM   Specimen: Nasal Mucosa; Nasal Swab  Result Value Ref Range Status   MRSA by PCR Next Gen NOT DETECTED NOT DETECTED Final    Comment: (NOTE) The GeneXpert MRSA Assay (FDA approved for NASAL specimens only), is one component of a comprehensive MRSA colonization surveillance program. It is not intended to diagnose MRSA infection nor to guide or monitor treatment for MRSA infections. Test performance is not FDA approved  in patients less than 55 years old. Performed at [facility], [address] [place], [place] [address]   Resp panel by RT-PCR (RSV, Flu A&B, Covid) Anterior Nasal Swab     Status: None   Collection Time: 04/28/22  1:29 PM   Specimen: Anterior Nasal Swab  Result Value Ref Range Status   SARS Coronavirus 2 by RT PCR NEGATIVE NEGATIVE Final    Comment: (NOTE) SARS-CoV-2 target nucleic acids are NOT DETECTED.  The SARS-CoV-2 RNA is generally detectable in upper respiratory specimens during the acute phase of infection. The lowest concentration of SARS-CoV-2 viral copies this assay can detect is 138 copies/mL. A negative result does not preclude SARS-Cov-2 infection and should not be used as the sole basis for treatment or other patient management decisions. A negative result may occur with  improper specimen collection/handling, submission of specimen other than nasopharyngeal swab, presence of viral mutation(s) within the areas targeted by this assay, and inadequate number of viral copies(<138 copies/mL). A negative result must be combined with clinical observations, patient history, and epidemiological information. The expected result is Negative.  Fact Sheet for Patients:  BloggerCourse.com  Fact Sheet for Healthcare Providers:  SeriousBroker.it  This test is no t yet approved or cleared by the [place] FDA and  has been authorized for detection and/or diagnosis of SARS-CoV-2 by FDA under an Emergency Use Authorization (EUA). This EUA will remain  in effect (meaning this test can be used) for the duration of the COVID-19 declaration under Section 564(b)(1) of the Act, 21 U.S.C.section 360bbb-3(b)(1), unless the authorization is terminated  or revoked sooner.       Influenza A by PCR NEGATIVE NEGATIVE Final   Influenza B by PCR NEGATIVE NEGATIVE Final    Comment: (NOTE) The Xpert Xpress  SARS-CoV-2/FLU/RSV plus assay is intended as an aid in the diagnosis of influenza from Nasopharyngeal swab specimens and should not be used as a sole basis for treatment. Nasal washings and aspirates are unacceptable for Xpert Xpress SARS-CoV-2/FLU/RSV testing.  Fact Sheet for Patients: BloggerCourse.com  Fact Sheet for Healthcare Providers: SeriousBroker.it  This test is not yet approved or cleared by the [place] FDA and has been authorized for detection and/or diagnosis of SARS-CoV-2 by FDA under an Emergency Use Authorization (EUA). This EUA will remain in effect (meaning this test can be used) for the duration of the COVID-19 declaration under Section 564(b)(1) of the Act, 21 U.S.C. section 360bbb-3(b)(1), unless the authorization is terminated or revoked.     Resp Syncytial Virus by PCR NEGATIVE NEGATIVE Final    Comment: (NOTE) Fact Sheet for Patients: BloggerCourse.com  Fact Sheet for Healthcare Providers: SeriousBroker.it  This test is not yet approved or cleared by the [place] FDA and has been authorized for detection and/or diagnosis of SARS-CoV-2 by FDA under an Emergency Use Authorization (EUA). This EUA will remain in effect (  meaning this test can be used) for the duration of the COVID-19 declaration under Section 564(b)(1) of the Act, 21 U.S.C. section 360bbb-3(b)(1), unless the authorization is terminated or revoked.  Performed at [facility], [address], [place], [place] [address]   Respiratory (~20 pathogens) panel by PCR     Status: Abnormal   Collection Time: 04/28/22  1:29 PM   Specimen: Nasopharyngeal Swab; Respiratory  Result Value Ref Range Status   Adenovirus NOT DETECTED NOT DETECTED Final   Coronavirus 229E NOT DETECTED NOT DETECTED Final    Comment: (NOTE) The Coronavirus on the Respiratory Panel, DOES NOT test  for the novel  Coronavirus (2019 nCoV)    Coronavirus HKU1 NOT DETECTED NOT DETECTED Final   Coronavirus NL63 NOT DETECTED NOT DETECTED Final   Coronavirus OC43 NOT DETECTED NOT DETECTED Final   Metapneumovirus NOT DETECTED NOT DETECTED Final   Rhinovirus / Enterovirus DETECTED (A) NOT DETECTED Final   Influenza A NOT DETECTED NOT DETECTED Final   Influenza B NOT DETECTED NOT DETECTED Final   Parainfluenza Virus 1 NOT DETECTED NOT DETECTED Final   Parainfluenza Virus 2 NOT DETECTED NOT DETECTED Final   Parainfluenza Virus 3 NOT DETECTED NOT DETECTED Final   Parainfluenza Virus 4 NOT DETECTED NOT DETECTED Final   Respiratory Syncytial Virus NOT DETECTED NOT DETECTED Final   Bordetella pertussis NOT DETECTED NOT DETECTED Final   Bordetella Parapertussis NOT DETECTED NOT DETECTED Final   Chlamydophila pneumoniae NOT DETECTED NOT DETECTED Final   Mycoplasma pneumoniae NOT DETECTED NOT DETECTED Final    Comment: Performed at [facility], [address] [place], [place] [address]    Labs: CBC: Recent Labs  Lab 07/15/22 [phone] 07/16/22 0434 07/17/22 0430 07/18/22 0352 07/19/22 0410  WBC 8.2 7.3 7.5 8.3 8.1  NEUTROABS 5.7 4.2 4.1 4.8 4.4  HGB 9.6* 9.0* 8.9* 9.0* 9.3*  HCT 30.4* 28.4* 28.4* 28.2* 30.0*  MCV 87.4 90.2 90.2 94.0 91.5  PLT 341 328 340 342 343   Basic Metabolic Panel: Recent Labs  Lab 07/14/22 0436 07/15/22 0427 07/16/22 0434 07/17/22 0430 07/18/22 0352 07/19/22 0410  NA  --  135 133* 135 139 136  K  --  4.0 3.8 4.0 4.1 4.0  CL  --  100 99 100 105 102  CO2  --  26 25 27 25 26   GLUCOSE  --  115* 90 98 93 91  BUN  --  18 18 23 23 22   CREATININE  --  1.05* 0.80 0.90 0.81 0.91  CALCIUM  --  8.4* 8.6* 9.0 8.8* 8.9  MG 1.7  --   --   --   --   --    Liver Function Tests: Recent Labs  Lab 07/14/22 0413  AST 16  ALT 12  ALKPHOS 67  BILITOT 0.5  PROT 6.0*  ALBUMIN 3.1*   CBG: No results for input(s): "GLUCAP" in the last 168 hours.  Discharge  time spent: greater than 30 minutes.  Signed: Briant Cedar, MD [facility] 07/19/2022

## 2022-07-19 NOTE — Consult Note (Signed)
   [facility] Inpatient Consult   07/19/2022  SHANTHI CRUISE 1934-10-17 [id number]  [facility] [[facility]]  Accountable Care Organization [ACO] Patient:  Primary Care Provider:  Joycelyn Man, NP [not a [facility] affiliated provider]   *[facility] Liaison remote coverage review for patient admitted to [facility]    Patient to return to [facility] for short term rehab.  Patient was reviewed for high risk score with post hosptial barriers to community care needs.  If the patient goes to a [facility] affiliated facility then, patient can be followed by [facility] [employer] [[facility]] Care Management PAC RN with traditional Medicare and approved Medicare Advantage plans.    Plan:   Will notify the [facility] RN who can follow for any known or needs for transitional care needs for returning to post facility care coordination needs to return to community.  For questions or referrals, please contact:   Charlesetta Shanks, RN BSN CCM [facility]  [phone] business mobile phone Toll free office [phone]  *Concierge Line  [phone] Fax number: [phone] [email or web address]

## 2022-07-19 NOTE — TOC Transition Note (Signed)
Transition of Care [facility]) - CM/SW Discharge Note   Patient Details  Name: [name] MRN: [id number] Date of Birth: 12/03/1934  [facility]) CM/SW Contact:  Larrie Kass, LCSW Phone Number: 07/19/2022, 9:14 AM   Clinical Narrative:    CSW spoke to [facility] admission coordinator with [facility] , she reports pt can d/c today. She stated to schedule pt for 2pm pick up. CSW informed pt and her daughter of d/c. Call report [phone]. [facility] called . No additional TOC needs TOC sign off.    Final next level of care: Skilled Nursing Facility Barriers to Discharge: No Barriers Identified   Patient Goals and CMS Choice CMS Medicare.gov Compare Post Acute Care list provided to:: Patient Represenative (must comment) Choice offered to / list presented to : Adult Children  Discharge Placement                    Name of family member notified: Braulio Conte (Daughter) [phone] [facility]) Patient and family notified of of transfer: 07/19/22  Discharge Plan and Services Additional resources added to the After Visit Summary for                                       Social Determinants of Health (SDOH) Interventions SDOH Screenings   Food Insecurity: Patient Unable To Answer (07/14/2022)  Housing: High Risk (07/14/2022)  Transportation Needs: No Transportation Needs (07/14/2022)  Utilities: Not At Risk (07/14/2022)  Tobacco Use: Low Risk  (07/13/2022)     Readmission Risk Interventions     No data to display

## 2022-07-19 NOTE — Progress Notes (Signed)
Made two attempts to call facility. RN left massage.  Thurston Pounds

## 2022-07-20 ENCOUNTER — Ambulatory Visit: Payer: 59 | Admitting: Physician Assistant

## 2022-07-26 DIAGNOSIS — I340 Nonrheumatic mitral (valve) insufficiency: Secondary | ICD-10-CM

## 2022-07-26 DIAGNOSIS — D649 Anemia, unspecified: Secondary | ICD-10-CM

## 2022-07-26 DIAGNOSIS — G8929 Other chronic pain: Secondary | ICD-10-CM

## 2022-07-26 DIAGNOSIS — I2510 Atherosclerotic heart disease of native coronary artery without angina pectoris: Secondary | ICD-10-CM

## 2022-07-26 DIAGNOSIS — J449 Chronic obstructive pulmonary disease, unspecified: Secondary | ICD-10-CM

## 2022-07-26 DIAGNOSIS — R2689 Other abnormalities of gait and mobility: Secondary | ICD-10-CM

## 2022-07-26 DIAGNOSIS — D860 Sarcoidosis of lung: Secondary | ICD-10-CM

## 2022-07-26 DIAGNOSIS — I480 Paroxysmal atrial fibrillation: Secondary | ICD-10-CM

## 2022-07-26 DIAGNOSIS — I471 Supraventricular tachycardia, unspecified: Secondary | ICD-10-CM

## 2022-07-26 DIAGNOSIS — M6281 Muscle weakness (generalized): Secondary | ICD-10-CM

## 2022-07-26 DIAGNOSIS — I5032 Chronic diastolic (congestive) heart failure: Secondary | ICD-10-CM

## 2022-07-26 DIAGNOSIS — I10 Essential (primary) hypertension: Secondary | ICD-10-CM

## 2022-08-02 ENCOUNTER — Other Ambulatory Visit: Payer: Self-pay | Admitting: *Deleted

## 2022-08-02 NOTE — Patient Outreach (Signed)
Late entry for 08/01/22  [name] resides in [facility].  Screening for potential [facility] care coordination services as a benefit of health plan and primary care provider.  Spoke with Deseree, Geophysicist/field seismologist. Mrs. Chohan is slated to transition to [facility] on 08/02/22.    No identifiable [facility] care coordination needs at this time.   Raiford Noble, MSN, RN,BSN [facility] Post Acute Care Coordinator [phone] (Direct dial)

## 2022-08-04 ENCOUNTER — Encounter: Payer: Self-pay | Admitting: Physician Assistant

## 2022-08-04 NOTE — Progress Notes (Deleted)
Cardiology Office Note    Date:  08/04/2022   ID:  ALNISHA SOLUM, DOB June 01, 1934, MRN [id number]  PCP:  Joycelyn Man, NP  Cardiologist:  Tonny Bollman, MD  Electrophysiologist:  None   Chief Complaint: ***  History of Present Illness:   [name] is a 88 y.o. female with history of moderate non-obstructive CAD noted on cardiac catheterization in 07/2021, chronic HFpEF, paroxysmal atrial fibrillation on Eliquis, SVT, mild carotid stenosis noted on dopplers in 06/2018 similar to prior, aortic atherosclerosis, hypertension, hyperlipidemia, severe obstructive lung disease and bronchiectasis with pulmonary sarcoidosis followed by pulmonology, COPD, GERD, anemia seen for follow-up.   She was admitted 07/2021 with chest pain and mildly elevated troponin. Echo showed normal LV function and grade 2 diastolic dysfunction. LHC at that time showed 40% stenosis of LAD, 60-70% stenosis of ostial LCX, and mild non-obstructive plaquing in the RCA. There was no significant changes compared to prior cath in 2017 and continued medical therapy was recommended. She also has known HFpEF and atrial fibrillation. The afib recurred during 02/2022 admission for AECOPD/RSV and then again in 03/2022, meds otherwise limited by hypotension (started on amiodarone in 02/2022). She was readmitted 06/2022 with worsening BLE/SOB in setting of possibly missing med administration at ALF. LE duplex neg for DVT. BNP 322. IM felt this was c/w CHF and diuresed her. She was also noted to have anemia with Hgb down into the 9 range, previously 11-12, received iron supplementation and continued on B12 supplement.   Pulm f/u needed, last saw 02/2022 Eliquis dose Amio labs UTD relatively  Chronic HFpEF Paroxysmal atrial fibrillation CAD, HLD Carotid stenosis   Labwork independently reviewed: 06/2022 K 4.0, Cr 0.91, Hgb 9.3, plt 343, Mg 1.7, ferritin 30, iron 15, TIBC wnl, alb 3.1, AST/ALT OK, BNP 322 03/2022 TSH  wnl 07/2021 LDL 105, trig 81   Past History   Past Medical History:  Diagnosis Date   Acute maxillary sinusitis 10/17/2017   Acute posthemorrhagic anemia 08/22/2012   Acute renal failure syndrome (HCC) 01/04/2019   Allergy    SEASONAL   Anemia    Arthritis    Asthma    "slight"    Baker's cyst of knee, right    CAD (coronary artery disease)    a. Myoview 10/15 - normal EF 70% // Myoview 11/16: EF 75%, normal perfusion, low risk // c. LHC 3/17 - LAD irregs, oLCx 70 (neg FFR), mRCA 30, EF 55-65% >> med Rx // Myoview 07/2018:  EF 73, extracardiac uptake, no significant ischemia (reviewed with Dr. Excell Seltzer), Low Risk // Myoview 6/22: EF 72, normal perfusion, low risk   Carotid stenosis    a. Carotid US 9/15 - Bilateral 1-39% ICA >> FU 2 years // b. Bilateral ICA 1-39 >> FU prn // Carotid US 06/2018: bilat 1-39; fu prn   Closed fracture of right olecranon process 05/09/2017   Closed left subtrochanteric femur fracture S/P Open/closed and reduction, internal medullary fixation  09/05/2012   Dyspnea    due to Sarcoidosis. When is windy or humed   Dysrhythmia    fast heart rate   Edema 10/30/2019   Elbow fracture 04/2017   Right, had surgery   Esophageal reflux    Fracture of inferior pubic ramus (HCC) 02/28/2019   Hematochezia    Hip fracture (HCC) 12/12/2018   History of echocardiogram    a. Echo 10/15 - EF 60-65%, no RWMA   HLD (hyperlipidemia)    Hx of colonoscopy  Hydronephrosis 07/29/2019   Left foot pain 09/02/2014   Assessment: 88 year old with acquired leg length discrepancy on the left who presents with 10 days of acute onset left foot pain. She seemed to be doing well prior to this pain with her orthotics that had been made to correct her leg length discrepancy. Negative x-rays at outside office which were again reviewed today.   This pain does not seem to impair her function and which has actually been im   Macular pucker, right eye    will have removed on 01/26/21    Osteoporosis    Other chronic pulmonary heart diseases    Paroxysmal supraventricular tachycardia    Pneumonia    Pneumonia 09/18/2019   Pre-op evaluation 06/11/2019   Pyelonephritis 07/29/2019   Sacral insufficiency fracture 12/12/2018   Sarcoidosis    Sepsis (HCC) 07/30/2019   Sepsis due to Escherichia coli (e. 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   COLONOSCOPY     CYSTOSCOPY W/ RETROGRADES Left 07/30/2019   Procedure: CYSTOSCOPY WITH RETROGRADE PYELOGRAM LEFT STENT PLACEMENT;  Surgeon: Crist Fat, MD;  Location: [facility] ORS;  Service: Urology;  Laterality: Left;   CYSTOSCOPY WITH RETROGRADE PYELOGRAM, URETEROSCOPY AND STENT PLACEMENT Left 08/20/2019   Procedure: CYSTOSCOPY, URETEROSCOPY AND STENT PLACEMENT;  Surgeon: Noel Christmas, MD;  Location: [facility] ORS;  Service: Urology;  Laterality: Left;  1 HR   FOOT SURGERY Right    HIP SURGERY Right 2011   full replacement   HOLMIUM LASER APPLICATION Left 08/20/2019   Procedure: HOLMIUM LASER APPLICATION;  Surgeon: Noel Christmas, MD;  Location: [facility];  Service: Urology;  Laterality: Left;   LEFT HEART CATH AND CORONARY ANGIOGRAPHY N/A 08/09/2021   Procedure: LEFT HEART CATH AND CORONARY ANGIOGRAPHY;  Surgeon: Tonny Bollman, MD;  Location: [facility] INVASIVE CV LAB;  Service: Cardiovascular;  Laterality: N/A;   LEG SURGERY Left 06/2012   femur fracture s/p open and closed reduction in [place], Dr. Mayford Knife   lens replacement Right    right eye   ORIF ELBOW FRACTURE Right 05/09/2017   Procedure: ORIF right  olecranon fracture with repair/reconstruction, ulnar nerve transposition as needed;  Surgeon: Dominica Severin, MD;  Location: [facility] OR;  Service: Orthopedics;  Laterality: Right;  Requests 90 mins   pelvis fracture     POLYPECTOMY     REVERSE SHOULDER ARTHROPLASTY Left 06/13/2019   Procedure: REVERSE SHOULDER ARTHROPLASTY;  Surgeon: Francena Hanly, MD;  Location: [facility];  Service: Orthopedics;  Laterality: Left;    SHOULDER ARTHROSCOPY W/ ROTATOR CUFF REPAIR Right    TRAPEZIUM RESECTION Right     Current Medications: No outpatient medications have been marked as taking for the 08/08/22 encounter (Appointment) with Laurann Montana, PA-C.   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  Tobacco Use   Smoking status: Never   Smokeless tobacco: Never  Vaping Use   Vaping Use: Never used  Substance and Sexual Activity   Alcohol use: No   Drug use: No   Sexual activity: Not Currently    Partners: Male    Birth control/protection: Post-menopausal  Other Topics Concern   Not on file  Social History Narrative   Lives with daughter and son in law   decaffeinated  use:  Coffee 1 per day   Right handed   Social Determinants of Health   Financial Resource Strain: Not on file  Food Insecurity: Patient Unable To Answer (07/14/2022)   Hunger Vital Sign    Worried About Running Out of Food in the Last Year: Patient unable to answer    Ran Out of Food in the Last Year: Patient unable to answer  Transportation Needs: No Transportation Needs (07/14/2022)   PRAPARE - Therapist, art (Medical): No    Lack of Transportation (Non-Medical): No  Physical Activity: Not on file  Stress: Not on file  Social Connections: Not on file     Family History:  The patient's ***family history includes Arrhythmia in her father; Cancer in her father, mother, and sister; Colon cancer (age of onset: 46) in her mother; Heart attack in her brother; Heart disease in her father; Lung cancer in her father. There is no history of Esophageal cancer, Rectal cancer, Stomach cancer, or Stroke.  ROS:   Please see the history of present illness. Otherwise, review of systems is positive for ***.  All other systems are reviewed and otherwise negative.    EKG(s)/Additional Testing   EKG:  EKG is ordered today, personally reviewed, demonstrating ***  CV Studies: Cardiac studies reviewed are outlined and summarized above. Otherwise please see EMR for full report.  Recent Labs: 04/18/2022: TSH 1.296 07/13/2022: B Natriuretic Peptide 322.0 07/14/2022: ALT 12; Magnesium 1.7 07/19/2022: BUN 22; Creatinine, Ser 0.91; Hemoglobin 9.3; Platelets 343; Potassium 4.0; Sodium 136  Recent Lipid Panel    Component Value Date/Time   CHOL 188 08/08/2021 0124   CHOL 149 11/27/2017 0800   TRIG 81 08/08/2021 0124   HDL 67 08/08/2021 0124   HDL 73 11/27/2017 0800   CHOLHDL 2.8 08/08/2021 0124   VLDL 16 08/08/2021 0124   LDLCALC 105 (H) 08/08/2021 0124   LDLCALC 63 11/27/2017 0800    PHYSICAL EXAM:    VS:  LMP 02/22/1983 (Approximate)   BMI: There is no height or weight on file to calculate BMI.  GEN: Well nourished, well developed female in no acute distress HEENT: normocephalic, atraumatic Neck: no JVD, carotid bruits, or masses Cardiac: ***RRR; no murmurs, rubs, or gallops, no edema  Respiratory:  clear to auscultation bilaterally, normal work of breathing GI: soft, nontender, nondistended, + BS MS: no deformity or atrophy Skin: warm and dry, no rash Neuro:  Alert and Oriented x  3, Strength and sensation are intact, follows commands Psych: euthymic mood, full affect  Wt Readings from Last 3 Encounters:  07/19/22 128 lb 15.5 oz (58.5 kg)  05/20/22 125 lb (56.7 kg)  04/18/22 127 lb 13.9 oz (58 kg)     ASSESSMENT & PLAN:   ***     Disposition: F/u with ***   Medication Adjustments/Labs and Tests Ordered:  Current medicines are reviewed at length with the patient today.  Concerns regarding medicines are outlined above. Medication changes, Labs and Tests ordered today are summarized above and listed in the Patient Instructions accessible in Encounters.   Signed, Laurann Montana, PA-C  08/04/2022 2:12 PM    [facility] Phone: [phone]; Fax: [phone]

## 2022-08-08 ENCOUNTER — Ambulatory Visit: Payer: 59 | Admitting: Physician Assistant

## 2022-08-08 DIAGNOSIS — I2510 Atherosclerotic heart disease of native coronary artery without angina pectoris: Secondary | ICD-10-CM

## 2022-08-08 DIAGNOSIS — I5032 Chronic diastolic (congestive) heart failure: Secondary | ICD-10-CM

## 2022-08-08 DIAGNOSIS — I480 Paroxysmal atrial fibrillation: Secondary | ICD-10-CM

## 2022-08-08 DIAGNOSIS — E785 Hyperlipidemia, unspecified: Secondary | ICD-10-CM

## 2022-08-08 DIAGNOSIS — I6523 Occlusion and stenosis of bilateral carotid arteries: Secondary | ICD-10-CM

## 2022-08-29 ENCOUNTER — Encounter: Payer: Self-pay | Admitting: Cardiovascular Disease

## 2022-08-29 ENCOUNTER — Ambulatory Visit: Payer: Medicare Other | Attending: Cardiovascular Disease | Admitting: Cardiovascular Disease

## 2022-08-29 VITALS — BP 128/68 | HR 55 | Ht 63.0 in | Wt 125.0 lb

## 2022-08-29 DIAGNOSIS — Z79899 Other long term (current) drug therapy: Secondary | ICD-10-CM | POA: Insufficient documentation

## 2022-08-29 DIAGNOSIS — Z5181 Encounter for therapeutic drug level monitoring: Secondary | ICD-10-CM | POA: Insufficient documentation

## 2022-08-29 DIAGNOSIS — I480 Paroxysmal atrial fibrillation: Secondary | ICD-10-CM | POA: Insufficient documentation

## 2022-08-29 NOTE — Patient Instructions (Signed)
Medication Instructions:  Your physician recommends that you continue on your current medications as directed. Please refer to the Current Medication list given to you today.  *If you need a refill on your cardiac medications before your next appointment, please call your pharmacy*   Lab Work: NONE If you have labs (blood work) drawn today and your tests are completely normal, you will receive your results only by: MyChart Message (if you have MyChart) OR A paper copy in the mail If you have any lab test that is abnormal or we need to change your treatment, we will call you to review the results.   Testing/Procedures: NONE   Follow-Up: At [facility], you and your health needs are our priority.  As part of our continuing mission to provide you with exceptional heart care, we have created designated Provider Care Teams.  These Care Teams include your primary Cardiologist (physician) and Advanced Practice Providers (APPs -  Physician Assistants and Nurse Practitioners) who all work together to provide you with the care you need, when you need it.  We recommend signing up for the patient portal called "MyChart".  Sign up information is provided on this After Visit Summary.  MyChart is used to connect with patients for Virtual Visits (Telemedicine).  Patients are able to view lab/test results, encounter notes, upcoming appointments, etc.  Non-urgent messages can be sent to your provider as well.   To learn more about what you can do with MyChart, go to [email or web address].    Your next appointment:   3 month(s)  Provider:   Jari Favre, PA-C     Then, Tonny Bollman, MD will plan to see you again in 1 year(s).

## 2022-08-29 NOTE — Progress Notes (Signed)
Cardiology Office Note:    Date:  08/29/2022   ID:  [name], [name], [name]  PCP:  Eloisa Northern, MD   [facility] Providers Cardiologist:  Tonny Bollman, MD Cardiology APP:  Kennon Rounds     Referring MD: Eloisa Northern, MD   Chief Complaint  Patient presents with   Atrial Fibrillation    History of Present Illness:    [name] is a 88 y.o. female with a hx of:  Coronary artery disease Cath 3/17: oLCx 60-70 (neg by FFR) Myoview 11/16: no ischemia Myoview 07/2018: EF 73, prob low risk (significant extracardiac uptake), no ischemia Myoview 6/22: low risk  Cath 07/2021: LCx 60-70 (no ? from last cath), LAD 40, RCA 30 - Med Rx Paroxysmal atrial fibrillation  CHA2DS2-VASc=5 (CAD, HTN, age x 2, female) >> Apixaban 2.5 mg (> 80 yo, wt <60 kg)  (HFpEF) heart failure with preserved ejection fraction  Echo 8/21: EF 55-60, GR 1 DD, RVSP 24.2, trivial MR Echocardiogram 6/23: EF 55-60, Gr 2 DD Aortic atherosclerosis  Pulmonary sarcoidosis Echo 8/21: RVSP 24.2 Echo 5/21: RVSP 36.9 Bronchiectasis Hypertension Hyperlipidemia SVT Carotid stenosis [place] 06/2018: bilat 1-39 Dementia   The patient is here with her daughter today. Currently at [facility], on the waiting list to get into [facility].  The patient was hospitalized in May 2024 with what was felt to be acute on chronic diastolic heart failure.  Her chest x-ray at that time showed pulmonary vascular congestion and her BNP was noted to be increased.  She was given 1 dose of Lasix and her medical regimen was adjusted.  She is now managed with low-dose oral furosemide and doing fairly well.  She is mostly in a wheelchair and has not been very active at all anymore.  She does not currently have any problems with edema.  She sleeps in a reclining bed, but denies any symptoms of orthopnea or PND.  She has had no recent chest pain.  She does have shortness of breath with physical activity.  Past  Medical History:  Diagnosis Date   Acute maxillary sinusitis 10/17/2017   Acute posthemorrhagic anemia 08/22/2012   Acute renal failure syndrome (HCC) 01/04/2019   Allergy    SEASONAL   Anemia    Aortic atherosclerosis (HCC)    Arthritis    Asthma    "slight"    Baker's cyst of knee, right    CAD (coronary artery disease)    Carotid stenosis    Closed fracture of right olecranon process 05/09/2017   Closed left subtrochanteric femur fracture S/P Open/closed and reduction, internal medullary fixation  09/05/2012   Dyspnea    due to Sarcoidosis. When is windy or humed   Edema 10/30/2019   Elbow fracture 04/2017   Right, had surgery   Esophageal reflux    Fracture of inferior pubic ramus (HCC) 02/28/2019   Hematochezia    Hip fracture (HCC) 12/12/2018   HLD (hyperlipidemia)    Hx of colonoscopy    Hydronephrosis 07/29/2019   Left foot pain 09/02/2014   Assessment: 88 year old with acquired leg length discrepancy on the left who presents with 10 days of acute onset left foot pain. She seemed to be doing well prior to this pain with her orthotics that had been made to correct her leg length discrepancy. Negative x-rays at outside office which were again reviewed today.   This pain does not seem to impair her function and which has actually been  im   Macular pucker, right eye    will have removed on 01/26/21   Osteoporosis    Paroxysmal supraventricular tachycardia    Pneumonia    Pyelonephritis 07/29/2019   Sacral insufficiency fracture 12/12/2018   Sarcoidosis    Sepsis (HCC) 07/30/2019   Sepsis due to Escherichia coli (e. 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   COLONOSCOPY     CYSTOSCOPY W/ RETROGRADES Left 07/30/2019   Procedure: CYSTOSCOPY WITH RETROGRADE PYELOGRAM LEFT STENT PLACEMENT;  Surgeon: Crist Fat, MD;  Location: [facility] ORS;  Service: Urology;  Laterality: Left;   CYSTOSCOPY WITH RETROGRADE PYELOGRAM, URETEROSCOPY AND STENT PLACEMENT Left 08/20/2019   Procedure: CYSTOSCOPY, URETEROSCOPY AND STENT PLACEMENT;  Surgeon: Noel Christmas, MD;  Location: [facility] ORS;  Service: Urology;  Laterality: Left;  1 HR   FOOT SURGERY Right    HIP SURGERY Right 2011   full replacement   HOLMIUM LASER APPLICATION Left 08/20/2019   Procedure: HOLMIUM LASER APPLICATION;  Surgeon: Noel Christmas, MD;  Location: [facility] ORS;  Service: Urology;  Laterality: Left;   LEFT HEART CATH AND CORONARY ANGIOGRAPHY N/A 08/09/2021   Procedure: LEFT HEART CATH AND CORONARY ANGIOGRAPHY;  Surgeon: Tonny Bollman, MD;  Location: [facility] INVASIVE CV LAB;  Service: Cardiovascular;  Laterality: N/A;   LEG SURGERY Left 06/2012   femur fracture s/p open and closed reduction in [place], Dr. Mayford Knife   lens replacement Right    right eye   ORIF ELBOW FRACTURE Right 05/09/2017   Procedure: ORIF right olecranon fracture with repair/reconstruction, ulnar nerve transposition as needed;  Surgeon: Dominica Severin, MD;  Location: [facility] OR;  Service: Orthopedics;  Laterality: Right;  Requests 90 mins   pelvis fracture     POLYPECTOMY     REVERSE SHOULDER ARTHROPLASTY Left 06/13/2019   Procedure: REVERSE SHOULDER ARTHROPLASTY;  Surgeon: Francena Hanly, MD;  Location: [facility]S;  Service: Orthopedics;  Laterality: Left;    SHOULDER ARTHROSCOPY W/ ROTATOR CUFF REPAIR Right    TRAPEZIUM RESECTION Right     Current Medications: Current Meds  Medication Sig   acetaminophen (TYLENOL) 500 MG tablet Take 1,000 mg by mouth every 6 (six) hours as needed (for pain).   amiodarone  (PACERONE) 100 MG tablet Take 1 tablet (100 mg total) by mouth daily.   Budeson-Glycopyrrol-Formoterol (BREZTRI AEROSPHERE) 160-9-4.8 MCG/ACT AERO Inhale 2 puffs into the lungs in the morning and at bedtime.   Calcium Carbonate Antacid (TUMS ULTRA 1000 PO) Take 1,000 mg by mouth every 12 (twelve) hours as needed (acid reflux/indigestion/heartburn).   ELIQUIS 2.5 MG TABS tablet TAKE 1 TABLET(2.5 MG) BY MOUTH TWICE DAILY (Patient taking differently: Take 2.5 mg by mouth in the morning and at bedtime.)   ferrous gluconate (FERGON) 324 MG tablet Take 1 tablet (324 mg total) by mouth daily with breakfast.   furosemide (LASIX) 20 MG tablet Take 1 tablet (20 mg total) by mouth daily.   guaiFENesin (MUCINEX) 600 MG 12 hr tablet Take 2 tablets (1,200 mg total) by mouth 2 (two) times daily.  ipratropium-albuterol (DUONEB) 0.5-2.5 (3) MG/3ML SOLN Take 3 mLs by nebulization every 6 (six) hours as needed (wheezing; shortness of breath).   omeprazole (PRILOSEC) 40 MG capsule Take 40 mg by mouth in the morning and at bedtime.   polyethylene glycol (MIRALAX / GLYCOLAX) 17 g packet Take 17 g by mouth daily as needed for mild constipation.   sertraline (ZOLOFT) 50 MG tablet Take 1 tablet (50 mg total) by mouth at bedtime.     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  Tobacco Use   Smoking status: Never   Smokeless tobacco: Never  Vaping Use   Vaping Use: Never used  Substance and Sexual Activity   Alcohol use: No   Drug use: No   Sexual activity: Not Currently    Partners: Male    Birth  control/protection: Post-menopausal  Other Topics Concern   Not on file  Social History Narrative   Lives with daughter and son in law   decaffeinated  use:  Coffee 1 per day   Right handed   Social Determinants of Health   Financial Resource Strain: Not on file  Food Insecurity: Patient Unable To Answer (07/14/2022)   Hunger Vital Sign    Worried About Running Out of Food in the Last Year: Patient unable to answer    Ran Out of Food in the Last Year: Patient unable to answer  Transportation Needs: No Transportation Needs (07/14/2022)   PRAPARE - Administrator, Civil Service (Medical): No    Lack of Transportation (Non-Medical): No  Physical Activity: Not on file  Stress: Not on file  Social Connections: Not on file     Family History: The patient's family history includes Arrhythmia in her father; Cancer in her father, mother, and sister; Colon cancer (age of onset: 12) in her mother; Heart attack in her brother; Heart disease in her father; Lung cancer in her father. There is no history of Esophageal cancer, Rectal cancer, Stomach cancer, or Stroke.  ROS:   Please see the history of present illness.    All other systems reviewed and are negative.  EKGs/Labs/Other Studies Reviewed:         Recent Labs: 04/18/2022: TSH 1.296 07/13/2022: B Natriuretic Peptide 322.0 07/14/2022: ALT 12; Magnesium 1.7 07/19/2022: BUN 22; Creatinine, Ser 0.91; Hemoglobin 9.3; Platelets 343; Potassium 4.0; Sodium 136  Recent Lipid Panel    Component Value Date/Time   CHOL 188 08/08/2021 0124   CHOL 149 11/27/2017 0800   TRIG 81 08/08/2021 0124   HDL 67 08/08/2021 0124   HDL 73 11/27/2017 0800   CHOLHDL 2.8 08/08/2021 0124   VLDL 16 08/08/2021 0124   LDLCALC 105 (H) 08/08/2021 0124   LDLCALC 63 11/27/2017 0800               Physical Exam:    VS:  BP 128/68   Pulse (!) 55   Ht 5\' 3"  (1.6 m)   Wt 125 lb (56.7 kg)   LMP 02/22/1983 (Approximate)   SpO2 96%   BMI 22.14  kg/m  Wt Readings from Last 3 Encounters:  08/29/22 125 lb (56.7 kg)  07/19/22 128 lb 15.5 oz (58.5 kg)  05/20/22 125 lb (56.7 kg)     GEN:  Well nourished, well developed pleasant elderly woman in no acute distress HEENT: Normal NECK: No JVD; No carotid bruits LYMPHATICS: No lymphadenopathy CARDIAC: RRR, no murmurs, rubs, gallops RESPIRATORY:  Clear to auscultation without rales, wheezing or rhonchi  ABDOMEN: Soft, non-tender, non-distended MUSCULOSKELETAL:  No edema; No deformity  SKIN: Warm and dry NEUROLOGIC:  Alert and oriented x 3 PSYCHIATRIC:  Normal affect   ASSESSMENT:    1. Medication management   2. Encounter for monitoring amiodarone therapy   3. Paroxysmal atrial fibrillation (HCC)    PLAN:    In order of problems listed above:  The patient's medical regimen appears appropriate.  She is on a very low-dose of amiodarone for rhythm control of atrial fibrillation.  She is treated with apixaban and uses daily low-dose Lasix. Will check thyroid and liver function at follow-up in a few months.  Recent chest x-ray reviewed.  Overall appears stable. Maintaining sinus rhythm.  Continue amiodarone for rhythm control and apixaban for oral anticoagulation.  Denies bleeding problems.  Will arrange APP follow-up in 3 months and amiodarone surveillance labs to be done at the time of that visit.           Medication Adjustments/Labs and Tests Ordered: Current medicines are reviewed at length with the patient today.  Concerns regarding medicines are outlined above.  Orders Placed This Encounter  Procedures   TSH   Comprehensive metabolic panel   No orders of the defined types were placed in this encounter.   Patient Instructions  Medication Instructions:  Your physician recommends that you continue on your current medications as directed. Please refer to the Current Medication list given to you today.  *If you need a refill on your cardiac medications before your  next appointment, please call your pharmacy*   Lab Work: NONE If you have labs (blood work) drawn today and your tests are completely normal, you will receive your results only by: MyChart Message (if you have MyChart) OR A paper copy in the mail If you have any lab test that is abnormal or we need to change your treatment, we will call you to review the results.   Testing/Procedures: NONE   Follow-Up: At [facility], you and your health needs are our priority.  As part of our continuing mission to provide you with exceptional heart care, we have created designated Provider Care Teams.  These Care Teams include your primary Cardiologist (physician) and Advanced Practice Providers (APPs -  Physician Assistants and Nurse Practitioners) who all work together to provide you with the care you need, when you need it.  We recommend signing up for the patient portal called "MyChart".  Sign up information is provided on this After Visit Summary.  MyChart is used to connect with patients for Virtual Visits (Telemedicine).  Patients are able to view lab/test results, encounter notes, upcoming appointments, etc.  Non-urgent messages can be sent to your provider as well.   To learn more about what you can do with MyChart, go to [email or web address].    Your next appointment:   3 month(s)  Provider:   Jari Favre, PA-C     Then, Tonny Bollman, MD will plan to see you again in 1 year(s).       Signed, Tonny Bollman, MD  08/29/2022 1:30 PM  [facility]

## 2022-09-19 ENCOUNTER — Telehealth: Payer: Self-pay | Admitting: Emergency Medicine

## 2022-09-19 NOTE — Telephone Encounter (Signed)
Pt. Daughter calling wanting mother to go back on Symbicort due to her mother not having breathing issues on Brestriz and the cost is lest pt. Daughter said she will my chart message [place] what pharmacy

## 2022-09-23 NOTE — Telephone Encounter (Signed)
She was supposed to have follow up two weeks after I saw her last in January 2024. She has not been seen since and has had three hospitalizations. Would not recommend changing her back to just Symbicort. May have to consider utilizing two inhalers if Markus Daft is unaffordable. She needs a follow up visit to discuss. If she is running low on her Markus Daft, she can come pick up samples and we can work on determining what is covered by her insurance by running test claim with pharmacy team while we wait for her appt. Thanks.

## 2022-09-23 NOTE — Telephone Encounter (Signed)
Atc pt daughter no answer, atc pt, she did answer and advised me to call her daughter

## 2022-09-28 NOTE — Telephone Encounter (Signed)
Daughter on DPR LVMT Call back to sched. Mother a f/u apt

## 2022-09-30 ENCOUNTER — Other Ambulatory Visit (HOSPITAL_COMMUNITY): Payer: Self-pay

## 2022-09-30 NOTE — Telephone Encounter (Signed)
Per test claims for 30 day supplies:  ICS/LABA/LAMA Breztri-$142.64 Trelegy-$145.42  ICS/LABA Dulera-$75.75 Breo-$90.04 Advair HFA-$70.51  LAMA Incruse-$78.29 Brand Spiriva Handihaler-$113.21 Spiriva Respimat-$116.61  *Patient is in Coverage Gap/[place]ut [place] phase of plan contributing to these prices.

## 2022-10-03 NOTE — Telephone Encounter (Signed)
Please let pt and daughter know she is in the coverage gap, which is why her medications are now so expensive. She can file for patient assistance and then get samples of Breztri. We may have to consider changing her to nebulized medications in the interim. Thanks

## 2022-10-10 ENCOUNTER — Ambulatory Visit: Payer: 59 | Admitting: Physician Assistant

## 2022-11-04 NOTE — Telephone Encounter (Signed)
Called and spoke with pt who verbalized understanding

## 2022-11-06 ENCOUNTER — Emergency Department (HOSPITAL_BASED_OUTPATIENT_CLINIC_OR_DEPARTMENT_OTHER)
Admission: EM | Admit: 2022-11-06 | Discharge: 2022-11-06 | Disposition: A | Discharge status: Home / Self Care | Payer: Medicare Other

## 2022-11-06 ENCOUNTER — Other Ambulatory Visit: Payer: Self-pay

## 2022-11-06 ENCOUNTER — Emergency Department (HOSPITAL_BASED_OUTPATIENT_CLINIC_OR_DEPARTMENT_OTHER): Payer: Medicare Other

## 2022-11-06 DIAGNOSIS — G8929 Other chronic pain: Secondary | ICD-10-CM | POA: Diagnosis not present

## 2022-11-06 DIAGNOSIS — Z96612 Presence of left artificial shoulder joint: Secondary | ICD-10-CM | POA: Insufficient documentation

## 2022-11-06 DIAGNOSIS — M25512 Pain in left shoulder: Secondary | ICD-10-CM | POA: Diagnosis present

## 2022-11-06 DIAGNOSIS — M25561 Pain in right knee: Secondary | ICD-10-CM | POA: Diagnosis not present

## 2022-11-06 DIAGNOSIS — I110 Hypertensive heart disease with heart failure: Secondary | ICD-10-CM | POA: Diagnosis not present

## 2022-11-06 DIAGNOSIS — I509 Heart failure, unspecified: Secondary | ICD-10-CM | POA: Insufficient documentation

## 2022-11-06 DIAGNOSIS — I2510 Atherosclerotic heart disease of native coronary artery without angina pectoris: Secondary | ICD-10-CM | POA: Insufficient documentation

## 2022-11-06 DIAGNOSIS — Z7901 Long term (current) use of anticoagulants: Secondary | ICD-10-CM | POA: Diagnosis not present

## 2022-11-06 NOTE — ED Provider Notes (Signed)
[name] EMERGENCY DEPARTMENT AT [facility] Provider Note   CSN: [id number] Arrival date & time: 11/06/22  1359     History  Chief Complaint  Patient presents with   Shoulder Pain    [name] is a 88 y.o. female history of CAD, hypertension, A-fib on Eliquis, mitral regurgitation, osteoporosis, CHF, left shoulder replacement, severe arthritis in the right knee requiring steroid injections presenting with left shoulder pain and knee pain.  Patient is unable to say when the pain started but that has been going on for quite some time.  Patient last had an injection in her right knee in June and is scheduled to have a steroid injection shortly.  Patient states she takes Tylenol but that it more or less does not work.  Patient denies any recent trauma to either of these joints.  She denies any fevers, skin color changes, inability to move the joints.  Patient denies any falls.  Home Medications Prior to Admission medications   Medication Sig Start Date End Date Taking? Authorizing Provider  acetaminophen (TYLENOL) 500 MG tablet Take 1,000 mg by mouth every 6 (six) hours as needed (for pain).    [provider]  amiodarone (PACERONE) 100 MG tablet Take 1 tablet (100 mg total) by mouth daily. 07/12/22   Sharlene Dory, PA-C  Budeson-Glycopyrrol-Formoterol (BREZTRI AEROSPHERE) 160-9-4.8 MCG/ACT AERO Inhale 2 puffs into the lungs in the morning and at bedtime. 09/29/21   Leslye Peer, MD  Calcium Carbonate Antacid (TUMS ULTRA 1000 PO) Take 1,000 mg by mouth every 12 (twelve) hours as needed (acid reflux/indigestion/heartburn).    [provider]  ELIQUIS 2.5 MG TABS tablet TAKE 1 TABLET(2.5 MG) BY MOUTH TWICE DAILY Patient taking differently: Take 2.5 mg by mouth in the morning and at bedtime. 01/31/22   Tonny Bollman, MD  ferrous gluconate (FERGON) 324 MG tablet Take 1 tablet (324 mg total) by mouth daily with breakfast. 07/20/22   Briant Cedar, MD   furosemide (LASIX) 20 MG tablet Take 1 tablet (20 mg total) by mouth daily. 07/20/22   Briant Cedar, MD  guaiFENesin (MUCINEX) 600 MG 12 hr tablet Take 2 tablets (1,200 mg total) by mouth 2 (two) times daily. 03/18/22   Glade Lloyd, MD  ipratropium-albuterol (DUONEB) 0.5-2.5 (3) MG/3ML SOLN Take 3 mLs by nebulization every 6 (six) hours as needed (wheezing; shortness of breath). 10/22/21   Cobb, Ruby Cola, NP  omeprazole (PRILOSEC) 40 MG capsule Take 40 mg by mouth in the morning and at bedtime.    [provider]  polyethylene glycol (MIRALAX / GLYCOLAX) 17 g packet Take 17 g by mouth daily as needed for mild constipation.    [provider]  sertraline (ZOLOFT) 50 MG tablet Take 1 tablet (50 mg total) by mouth at bedtime. 12/23/21   Anson Fret, MD      Allergies    Levaquin [levofloxacin], Oxycodone, Oxycodone-acetaminophen, Sulfonamide derivatives, Sulfa antibiotics, and Morphine    Review of Systems   Review of Systems  Physical Exam Updated Vital Signs BP (!) 148/50   Pulse 64   Temp 98.4 F (36.9 C) (Oral)   Resp 16   LMP 02/22/1983 (Approximate)   SpO2 98%  Physical Exam Constitutional:      General: She is not in acute distress. Cardiovascular:     Rate and Rhythm: Normal rate.     Pulses: Normal pulses.  Musculoskeletal:     Comments: Left shoulder: No obvious deformities noted,  no step-off/crepitus when palpated, mildly tender to palpation on the deltoid and no bony tenderness noted, can range minimally but is limited due to pain, able to passively range shoulder without pain Left upper extremity: 5 out of 5 grip strength Right knee: 4 out of 5 knee extension/flexion, mildly edematous however not warm to palpation, no overlying skin color changes Right ankle: 5 out of 5 dorsiflexion/plantarflexion Soft compartments Pain not out of proportion  Skin:    General: Skin is warm and dry.     Capillary Refill: Capillary refill takes less than  2 seconds.     Findings: No erythema.     Comments: No signs of trauma to either joints  Neurological:     Mental Status: She is alert and oriented to person, place, and time.     Comments: Sensation intact distally  Psychiatric:        Mood and Affect: Mood normal.     ED Results / Procedures / Treatments   Labs (all labs ordered are listed, but only abnormal results are displayed) Labs Reviewed - No data to display  EKG None  Radiology DG Knee Complete 4 Views Right  Result Date: 11/06/2022 CLINICAL DATA:  Chronic knee pain, no known injury EXAM: RIGHT KNEE - COMPLETE 4+ VIEW COMPARISON:  None Available. FINDINGS: Osteopenia. No fracture or dislocation. Severe tricompartmental arthrosis, worst in the patellofemoral compartment. No knee joint effusion. Soft tissues unremarkable. IMPRESSION: 1. Osteopenia. 2. Severe tricompartmental arthrosis, worst in the patellofemoral compartment. 3. No fracture or dislocation of the right knee. Electronically Signed   By: Jearld Lesch M.D.   On: 11/06/2022 15:05   DG Shoulder Left  Result Date: 11/06/2022 CLINICAL DATA:  Left shoulder pain.  Status post arthroplasty. EXAM: LEFT SHOULDER - 2+ VIEW COMPARISON:  Chest radiograph from 04/18/2022 FINDINGS: Postoperative changes identified from left total reverse arthroplasty. No signs of dislocation or periprosthetic fracture. Soft tissues are unremarkable. Visualized portions of the left lung appear clear. IMPRESSION: Status post left total reverse arthroplasty. No acute fracture identified. Electronically Signed   By: Signa Kell M.D.   On: 11/06/2022 15:03    Procedures Procedures    Medications Ordered in ED Medications - No data to display  ED Course/ Medical Decision Making/ A&P                                 Medical Decision Making  [name] 88 y.o. presented today for left shoulder pain, right knee pain. Working DDx that I considered at this time includes, but not limited  to, arthritis, contusion, strain/sprain, fracture, dislocation, neurovascular compromise, septic joint, ischemic limb, compartment syndrome, use of capsulitis.  R/o DDx: contusion, strain/sprain, fracture, dislocation, neurovascular compromise, septic joint, ischemic limb, compartment syndrome, use of capsulitis: These are considered less likely due to history of present illness, physical exam, labs/imaging findings.  Review of prior external notes: 09/14/2022 unknown  Unique Tests and My Interpretation:  Right knee x-ray: Severe tricompartmental osteoarthritis Left shoulder x-ray: No acute changes  Discussion with Independent Historian: None  Discussion of Management of Tests: None  Risk: Low: based on diagnostic testing/clinical impression and treatment plan  Risk Stratification Score: None  Staffed with Young, DO  Plan: On exam patient was in no acute distress with stable vitals.  Patient was able to minimally range her left shoulder but states is limited due to pain.  Patient states that her baseline  is that she can minimally move her shoulder and so unclear if this is different from her baseline or a change however patient does have so replacement and does see [facility] for this and her right knee chronically.  Patient did have a steroid injection back in June and she states she is supposed to have another 1 shortly for her right knee.  Patient came in today to receive x-rays.  Knee did appear slight edematous but did not show any signs of infection and suspect her arthritis is causing her symptoms however will obtain x-rays of both joints and apply ice.  I offered Tylenol however patient did not want Tylenol.  Anticipate discharge with orthopedic follow-up.  X-rays negative.  Suspect patient's pain is related to her chronic left shoulder and right knee pain and will have her follow-up with her orthopedist for steroid injections and further management.  I recommended patient does not  take ibuprofen as she is on Eliquis and instead recommended Tylenol every 6 hours with ice and using a knee sleeve.  Patient was given return precautions. Patient stable for discharge at this time.  Patient verbalized understanding of plan.         Final Clinical Impression(s) / ED Diagnoses Final diagnoses:  Chronic pain of right knee  Left shoulder pain, unspecified chronicity    Rx / DC Orders ED Discharge Orders     None         Netta Corrigan, PA-C 11/06/22 1526    Coral Spikes, DO 11/10/22 [phone]

## 2022-11-06 NOTE — ED Notes (Signed)
Called [facility] for pt transport

## 2022-11-06 NOTE — ED Triage Notes (Signed)
Pt arrived via [facility] from [place] at [facility]. Pt alert, NAD while at rest. Pt c/o R shoulder pain increasing on ROM that started yesterday denying any fall/trauma to the shoulder. PMH surgery on L shoulder. Pt also c/o chronic R knee pain.   EMS VS  BP 154/60 HR 60 RR 16

## 2022-11-06 NOTE — Discharge Instructions (Signed)
Please follow-up with orthopedic specialist regarding symptoms and ER visit.  Today your x-rays were negative but do show arthritis which is most likely causing your pain.  You may use Tylenol every 6 hours and ice your joints as well.  You may use the knee sleeve we gave you today to help with swelling in your knee.  If symptoms change or worsen please return to ER.

## 2022-11-09 ENCOUNTER — Ambulatory Visit (INDEPENDENT_AMBULATORY_CARE_PROVIDER_SITE_OTHER): Payer: Medicare Other | Admitting: Nurse Practitioner

## 2022-11-09 ENCOUNTER — Encounter: Payer: Self-pay | Admitting: Nurse Practitioner

## 2022-11-09 ENCOUNTER — Ambulatory Visit: Payer: Medicare Other

## 2022-11-09 VITALS — BP 114/50 | HR 56 | Ht 63.0 in | Wt 125.0 lb

## 2022-11-09 DIAGNOSIS — J90 Pleural effusion, not elsewhere classified: Secondary | ICD-10-CM

## 2022-11-09 DIAGNOSIS — J479 Bronchiectasis, uncomplicated: Secondary | ICD-10-CM

## 2022-11-09 DIAGNOSIS — J449 Chronic obstructive pulmonary disease, unspecified: Secondary | ICD-10-CM

## 2022-11-09 DIAGNOSIS — I5032 Chronic diastolic (congestive) heart failure: Secondary | ICD-10-CM | POA: Diagnosis not present

## 2022-11-09 NOTE — Patient Instructions (Addendum)
Continue Breztri 2 puffs Twice daily. Brush tongue and rinse mouth afterwards Continue Albuterol inhaler 2 puffs or 3 mL neb every 6 hours as needed for shortness of breath or wheezing. Notify if symptoms persist despite rescue inhaler/neb use.  Continue guaifenesin (mucinex) twice a day as needed for cough/ congestion  Continue Saline nasal rinses 1-2 times a day for nasal congestion. Continue Flonase nasal spray 2 sprays each nostril daily until sinus congestion improves  Chest x ray today to make sure fluid has resolved   Follow up in 4 months with [name] or [name],NP.  If symptoms do not improve or worsen, please contact office for sooner follow up or seek emergency care.

## 2022-11-09 NOTE — Assessment & Plan Note (Signed)
See above

## 2022-11-09 NOTE — Assessment & Plan Note (Signed)
Due to volume overload. Recheck CXR today. Lung exam improved.

## 2022-11-09 NOTE — Progress Notes (Signed)
@Patient  ID: [name], female    DOB: Jul 21, 1934, 88 y.o.   MRN: [id number]  Chief Complaint  Patient presents with   Follow-up    Sob is better, pt is no longer on o2    Referring provider: Eloisa Northern, MD  HPI: 88 year old female, never smoker followed for severe obstructive lung disease and bronchiectasis associated with sarcoidosis.  She has patient Dr. Kavin Leech and last seen in office 03/04/2022 by [facility] NP.  Past medical history significant for chronic rhinitis, GERD, A-fib on Eliquis, CHF.  TEST/EVENTS:  11/27/2013 PFTs: FVC 73, FEV1 52, ratio 54, TLC 86, DLCO 41 08/08/2021 echocardiogram: EF 55 to 60%.  G2 DD.  RV size and function is normal.  Normal PASP.  There is mild MR.  TR is mild to moderate. 08/10/2021 CT chest without contrast: Atherosclerosis.  Calcified mediastinal lymph nodes are noted most consistent with prior granulomatous disease.  There is a small sliding-type hiatal hernia.  There is a very small left-sided pleural effusion.  Stable right apical and basilar scarring is noted as well.  10/07/2021 CXR 2 view: stable chronic lung disease/emphysema.  07/13/2022 CXR: cardiomegaly. Vascular congestion. Small b/l pleural effusions. Hyperinflation   08/17/2021: OV with Dr. Delton Coombes.  Hospitalized earlier this month for chest discomfort superimposed on chronic progressive dyspnea with a mild troponin elevation.  She underwent left heart catheterization that showed stable significant CAD (unchanged).  She also is a history of paroxysmal SVT, unclear whether this was a factor during this admission.  There is no evidence of a COPD exacerbation.  A CT was performed which showed some calcified mediastinal adenopathy consistent with granulomatous disease, small right apical and basilar scarring without any evidence of pneumonia.  There is also a small left pleural effusion and hiatal hernia.  Functional capacity and exertional shortness of breath have both progressively worsened over the  last 6 months.  She has been on Symbicort; recommended step up to [place].  10/07/2021: OV with Leatrice Parilla NP for follow-up.  She did get some benefit from step up to [facility] after her last visit.  However, they report that over the last week or so, she has had some increased shortness of breath with exertion.  Gets winded walking any sort of distance.  They do not feel like her activity tolerance is what it used to be.  She has an occasional cough, which was slightly increased yesterday but today seems more so back to her baseline.  Nonproductive.  Her great granddaughter did recently get diagnosed with croup.  They are unsure if this is related.  Nasal congestion and drainage stable.  No significant wheezing, chest tightness.  She does not have any hemoptysis.  Denies any fevers, night sweats, chills, increased fatigue, lower extremity swelling, palpitations, orthopnea.  Using rescue a few times a week. She has had a progressive decline in her respiratory status over the last 6 months.  CT chest from June was stable.  She was started on Breztri which she did get some benefit from.  Seems that she could be having an acute exacerbation of her COPD related to exposure to viral infection.  We will treat her with prednisone burst to see if she has any perceived benefit from this.  Recommended that she continue triple therapy and as needed albuterol.  CXR today to rule out superimposed infection.  10/22/2021: OV with Yazmen Briones NP for follow-up after being treated for potential AECOPD versus progression of disease.  She reports that she feels relatively  unchanged.  She actually ended up never taking the prednisone.  She took 1 dose and then forgot to take the rest of them.  Her daughter had gone out of town with her husband and had left the medicine out but the patient did not realize that she was post to be taking it.  She does tend to have some forgetfulness.  Does state that she never forgets to take her inhaler and her other  medicines are in her pillbox.  She did not notice any significant benefit from the 1 dose that she did take.  Her breathing overall feels the same.  She gets winded with minimal activity, which has been her baseline for about the past 6 months.  Previous imaging did not show any evidence of progression of disease.  She has an occasional nonproductive cough , which is her baseline.  She denies any significant chest congestion or wheezing.  Denies any fevers, chills, skin lesions, vision changes, hemoptysis, weight loss, anorexia, orthopnea, lower extremity swelling.  She continues on [employer] inhaler.  Does not have a nebulizer machine.  She does use albuterol daily.  Walking oximetry at previous visit without desaturations.  03/04/2022: OV with Vicy Medico Np for acute visit with her daughter. She started feeling bad over the past few days. She developed nasal congestion and drainage with yellow mucus. She had a cough that was productive with yellow sputum. She was also feeling more fatigued. Then today she started having increased shortness of breath, chest congestion, and wheezing. She has ear pain/pressure that is new, worse on the left side. No fevers, chills, hemoptysis. Eating and drinking well. She was with her great grandchild on Sunday, who became sick earlier this week and has pneumonia. They were told she was negative for flu, RSV, and COVID. They would like to see if she could be tested for this. She is still using her Breztri inhaler. Taking mucinex at home.   11/09/2022: Today - follow up Patient presents today for overdue follow up with her daughter. She has had two hospitalizations since she was here last. February was admitted for a fib RVR. Again admitted in May for acute CHF exacerbation and volume overload. She required oxygen therapy. Once they were able to successfully diuresis her and address her urine retention, she was able to get off of oxygen. Has not required any supplemental O2 the past few  months. She is currently in assisted living. From a breathing standpoint, she's been doing better. No significant cough, chest congestion or wheezing. She is relatively sedentary due to chronic knee pain from severe osteoarthritis. Not having to use albuterol. Takes Breztri twice a day. Currently in the coverage gap so this is expensive.   Allergies  Allergen Reactions   Levaquin [Levofloxacin] Other (See Comments)    Joint pain.. Per doctor, told to not to take again and "allergic," per [facility]   Oxycodone Nausea And Vomiting and Other (See Comments)    Can take oxycodone with Zofran (otherwise, GI Intolerance)   Oxycodone-Acetaminophen Other (See Comments)    GI Intolerance and "talking out of my head" and "allergic," per [facility]   Sulfonamide Derivatives Other (See Comments)    Possibly caused hepatitis and "allergic," per [facility]   Sulfa Antibiotics Other (See Comments)    Possibly caused hepatitis and "allergic," per [facility]   Morphine Nausea Only and Other (See Comments)    GI intolerance and "allergic," per [facility]    Immunization History  Administered Date(s) Administered   Fluad  Quad(high Dose 65+) 11/19/2019   Influenza Inj Mdck Quad Pf 11/05/2018   Influenza Split 10/28/2008, 11/23/2009, 11/17/2010, 11/29/2011, 10/18/2012, 11/18/2013   Influenza, High Dose Seasonal PF 11/02/2016, 11/04/2017, 02/22/2022   Influenza, Quadrivalent, Recombinant, Inj, Pf 11/04/2017, 11/29/2018   Influenza,inj,Quad PF,6+ Mos 11/18/2013, 11/12/2014   Influenza,inj,quad, With Preservative 11/26/2018, 12/24/2019   Influenza-Unspecified 11/05/2013, 11/06/2014, 12/07/2015   PFIZER(Purple Top)SARS-COV-2 Vaccination 03/14/2019, 04/04/2019, 12/12/2019   Pneumococcal Conjugate-13 02/25/2014   Pneumococcal Polysaccharide-23 05/03/2001   Pneumococcal-Unspecified 05/03/2001, 02/03/2016   Td 10/18/2011   Tdap 01/02/2012, 01/14/2012   Zoster, Live 10/18/2012, 11/08/2013    Past Medical History:  Diagnosis Date   Acute  maxillary sinusitis 10/17/2017   Acute posthemorrhagic anemia 08/22/2012   Acute renal failure syndrome (HCC) 01/04/2019   Allergy    SEASONAL   Anemia    Aortic atherosclerosis (HCC)    Arthritis    Asthma    "slight"    Baker's cyst of knee, right    CAD (coronary artery disease)    Carotid stenosis    Closed fracture of right olecranon process 05/09/2017   Closed left subtrochanteric femur fracture S/P Open/closed and reduction, internal medullary fixation  09/05/2012   Dyspnea    due to Sarcoidosis. When is windy or humed   Edema 10/30/2019   Elbow fracture 04/2017   Right, had surgery   Esophageal reflux    Fracture of inferior pubic ramus (HCC) 02/28/2019   Hematochezia    Hip fracture (HCC) 12/12/2018   HLD (hyperlipidemia)    Hx of colonoscopy    Hydronephrosis 07/29/2019   Left foot pain 09/02/2014   Assessment: 89 year old with acquired leg length discrepancy on the left who presents with 10 days of acute onset left foot pain. She seemed to be doing well prior to this pain with her orthotics that had been made to correct her leg length discrepancy. Negative x-rays at outside office which were again reviewed today.   This pain does not seem to impair her function and which has actually been im   Macular pucker, right eye    will have removed on 01/26/21   Osteoporosis    Paroxysmal supraventricular tachycardia    Pneumonia    Pyelonephritis 07/29/2019   Sacral insufficiency fracture 12/12/2018   Sarcoidosis    Sepsis (HCC) 07/30/2019   Sepsis due to Escherichia coli (e. coli) (HCC) 07/29/2019   Sepsis due to urinary tract infection (HCC) 07/18/2019    Tobacco History: Social History   Tobacco Use  Smoking Status Never  Smokeless Tobacco Never   Counseling given: Not Answered   Outpatient Medications Prior to Visit  Medication Sig Dispense Refill   acetaminophen (TYLENOL) 500 MG tablet Take 1,000 mg by mouth every 6 (six) hours as needed (for pain).      amiodarone (PACERONE) 100 MG tablet Take 1 tablet (100 mg total) by mouth daily. 90 tablet 3   Budeson-Glycopyrrol-Formoterol (BREZTRI AEROSPHERE) 160-9-4.8 MCG/ACT AERO Inhale 2 puffs into the lungs in the morning and at bedtime. 10.7 g 5   Calcium Carbonate Antacid (TUMS ULTRA 1000 PO) Take 1,000 mg by mouth every 12 (twelve) hours as needed (acid reflux/indigestion/heartburn).     ELIQUIS 2.5 MG TABS tablet TAKE 1 TABLET(2.5 MG) BY MOUTH TWICE DAILY (Patient taking differently: Take 2.5 mg by mouth in the morning and at bedtime.) 180 tablet 1   ferrous gluconate (FERGON) 324 MG tablet Take 1 tablet (324 mg total) by mouth daily with breakfast.  3   furosemide (LASIX)  20 MG tablet Take 1 tablet (20 mg total) by mouth daily.     guaiFENesin (MUCINEX) 600 MG 12 hr tablet Take 2 tablets (1,200 mg total) by mouth 2 (two) times daily. 30 tablet 0   ipratropium-albuterol (DUONEB) 0.5-2.5 (3) MG/3ML SOLN Take 3 mLs by nebulization every 6 (six) hours as needed (wheezing; shortness of breath). 75 mL 5   levofloxacin (LEVAQUIN) 250 MG tablet Take 250 mg by mouth daily.     omeprazole (PRILOSEC) 40 MG capsule Take 40 mg by mouth in the morning and at bedtime.     polyethylene glycol (MIRALAX / GLYCOLAX) 17 g packet Take 17 g by mouth daily as needed for mild constipation.     sertraline (ZOLOFT) 50 MG tablet Take 1 tablet (50 mg total) by mouth at bedtime. 30 tablet 1   No facility-administered medications prior to visit.     Review of Systems:   Constitutional: No weight loss or gain, night sweats, fevers, chills, or lassitude. + Fatigue (baseline) HEENT: No headaches, difficulty swallowing, tooth/dental problems, or sore throat. No sneezing, itching, nasal congestion, ear pressure/pain CV:  No chest pain, orthopnea, PND, swelling in lower extremities, anasarca, dizziness, palpitations, syncope Resp: +shortness of breath with exertion (improved). No cough. No wheezing. No hemoptysis.  No chest wall  deformity GI:  No heartburn, indigestion GU: No dysuria, change in color of urine, urgency or frequency.  Skin: No rash, lesions, ulcerations MSK:  +chronic knee pain  Neuro: No dizziness or lightheadedness.  Psych: No depression or anxiety. Mood stable.     Physical Exam:  BP (!) 114/50   Pulse (!) 56   Ht 5\' 3"  (1.6 m)   Wt 125 lb (56.7 kg)   LMP 02/22/1983 (Approximate)   SpO2 96%   BMI 22.14 kg/m   GEN: Pleasant, interactive, well-kempt; elderly; in no acute distress. HEENT:  Normocephalic and atraumatic. PERRLA. Sclera white. Nasal turbinates pink, moist and patent bilaterally. No rhinorrhea present. Oropharynx pink and moist, without exudate or edema. No lesions, ulcerations, or postnasal drip.  NECK:  Supple w/ fair ROM. No JVD present. Thyroid symmetrical with no goiter or nodules palpated. No lymphadenopathy.   CV: RRR, no m/r/g, no peripheral edema. Pulses intact, +2 bilaterally. No cyanosis, pallor or clubbing. PULMONARY:  Unlabored, regular breathing. Clear bilaterally A&P w/o wheezes/rales/rhonchi. No accessory muscle use. No dullness to percussion. GI: BS present and normoactive. Soft, non-tender to palpation. No organomegaly or masses detected.  MSK: No erythema, warmth or tenderness. Cap refil <2 sec all extrem.  Neuro: A/Ox3. No focal deficits noted.   Skin: Warm, no lesions or rashe Psych: Normal affect and behavior. Judgement and thought content appropriate.     Lab Results:  CBC    Component Value Date/Time   WBC 8.1 07/19/2022 0410   RBC 3.28 (L) 07/19/2022 0410   HGB 9.3 (L) 07/19/2022 0410   HGB 12.7 07/21/2020 1315   HCT 30.0 (L) 07/19/2022 0410   HCT 38.4 07/21/2020 1315   PLT 343 07/19/2022 0410   PLT 309 07/21/2020 1315   MCV 91.5 07/19/2022 0410   MCV 94 07/21/2020 1315   MCH 28.4 07/19/2022 0410   MCHC 31.0 07/19/2022 0410   RDW 15.8 (H) 07/19/2022 0410   RDW 12.7 07/21/2020 1315   LYMPHSABS 2.2 07/19/2022 0410   MONOABS 0.8  07/19/2022 0410   EOSABS 0.2 07/19/2022 0410   BASOSABS 0.1 07/19/2022 0410    BMET    Component Value Date/Time   NA  136 07/19/2022 0410   NA 141 05/27/2022 1013   K 4.0 07/19/2022 0410   CL 102 07/19/2022 0410   CO2 26 07/19/2022 0410   GLUCOSE 91 07/19/2022 0410   BUN 22 07/19/2022 0410   BUN 14 05/27/2022 1013   CREATININE 0.91 07/19/2022 0410   CREATININE 0.85 05/18/2015 1028   CALCIUM 8.9 07/19/2022 0410   GFRNONAA >60 07/19/2022 0410   GFRAA 78 11/26/2019 1234    BNP    Component Value Date/Time   BNP 322.0 (H) 07/13/2022 1500     Imaging:  DG Knee Complete 4 Views Right  Result Date: 11/06/2022 CLINICAL DATA:  Chronic knee pain, no known injury EXAM: RIGHT KNEE - COMPLETE 4+ VIEW COMPARISON:  None Available. FINDINGS: Osteopenia. No fracture or dislocation. Severe tricompartmental arthrosis, worst in the patellofemoral compartment. No knee joint effusion. Soft tissues unremarkable. IMPRESSION: 1. Osteopenia. 2. Severe tricompartmental arthrosis, worst in the patellofemoral compartment. 3. No fracture or dislocation of the right knee. Electronically Signed   By: Jearld Lesch M.D.   On: 11/06/2022 15:05   DG Shoulder Left  Result Date: 11/06/2022 CLINICAL DATA:  Left shoulder pain.  Status post arthroplasty. EXAM: LEFT SHOULDER - 2+ VIEW COMPARISON:  Chest radiograph from 04/18/2022 FINDINGS: Postoperative changes identified from left total reverse arthroplasty. No signs of dislocation or periprosthetic fracture. Soft tissues are unremarkable. Visualized portions of the left lung appear clear. IMPRESSION: Status post left total reverse arthroplasty. No acute fracture identified. Electronically Signed   By: Signa Kell M.D.   On: 11/06/2022 15:03    Administration History     None          Latest Ref Rng & Units 11/27/2013    3:33 PM  PFT Results  FVC-Pre L 1.89   FVC-Predicted Pre % 73   FVC-Post L 2.01   FVC-Predicted Post % 77   Pre FEV1/FVC % % 54    Post FEV1/FCV % % 54   FEV1-Pre L 1.02   FEV1-Predicted Pre % 52   FEV1-Post L 1.09   DLCO uncorrected ml/min/mmHg 9.47   DLCO UNC% % 41   DLCO corrected ml/min/mmHg 9.47   DLCO COR %Predicted % 41   DLVA Predicted % 69   TLC L 4.22   TLC % Predicted % 86   RV % Predicted % 95     No results found for: "NITRICOXIDE"      Assessment & Plan:   COPD (chronic obstructive pulmonary disease) (HCC) Severe obstructive lung disease with btx. Compensated on current regimen. No recent exacerbations requiring steroids or abx. Continue bronchodilator and mucociliary clearance therapies. Provided with samples of Breztri and patient assistance application. Action plan in place. Small, frequent high protein meals. Limited activity due to chronic knee pain. PChair exercises encouraged.   Patient Instructions  Continue Breztri 2 puffs Twice daily. Brush tongue and rinse mouth afterwards Continue Albuterol inhaler 2 puffs or 3 mL neb every 6 hours as needed for shortness of breath or wheezing. Notify if symptoms persist despite rescue inhaler/neb use.  Continue guaifenesin (mucinex) twice a day as needed for cough/ congestion  Continue Saline nasal rinses 1-2 times a day for nasal congestion. Continue Flonase nasal spray 2 sprays each nostril daily until sinus congestion improves  Chest x ray today to make sure fluid has resolved   Follow up in 4 months with [name] or Florentina Addison Darriona Dehaas,NP.  If symptoms do not improve or worsen, please contact office for sooner follow up  or seek emergency care.   Bronchiectasis without complication (HCC) See above  Bilateral pleural effusion Due to volume overload. Recheck CXR today. Lung exam improved.   Chronic diastolic CHF (congestive heart failure) (HCC) Euvolemic on exam today. Follow up with cardiology as scheduled   I spent 35 minutes of dedicated to the care of this patient on the date of this encounter to include pre-visit review of records,  face-to-face time with the patient discussing conditions above, post visit ordering of testing, clinical documentation with the electronic health record, making appropriate referrals as documented, and communicating necessary findings to members of the patients care team.  Noemi Chapel, NP 11/09/2022  Pt aware and understands NP's role.

## 2022-11-09 NOTE — Assessment & Plan Note (Addendum)
Severe obstructive lung disease with btx. Compensated on current regimen. No recent exacerbations requiring steroids or abx. Continue bronchodilator and mucociliary clearance therapies. Provided with samples of Breztri and patient assistance application. Action plan in place. Small, frequent high protein meals. Limited activity due to chronic knee pain. PChair exercises encouraged.   Patient Instructions  Continue Breztri 2 puffs Twice daily. Brush tongue and rinse mouth afterwards Continue Albuterol inhaler 2 puffs or 3 mL neb every 6 hours as needed for shortness of breath or wheezing. Notify if symptoms persist despite rescue inhaler/neb use.  Continue guaifenesin (mucinex) twice a day as needed for cough/ congestion  Continue Saline nasal rinses 1-2 times a day for nasal congestion. Continue Flonase nasal spray 2 sprays each nostril daily until sinus congestion improves  Chest x ray today to make sure fluid has resolved   Follow up in 4 months with [name] or Florentina Addison Joann Jorge,NP.  If symptoms do not improve or worsen, please contact office for sooner follow up or seek emergency care.

## 2022-11-09 NOTE — Assessment & Plan Note (Signed)
Euvolemic on exam today.  Follow-up with cardiology as scheduled.

## 2022-11-28 DIAGNOSIS — I340 Nonrheumatic mitral (valve) insufficiency: Secondary | ICD-10-CM

## 2022-11-28 DIAGNOSIS — I2510 Atherosclerotic heart disease of native coronary artery without angina pectoris: Secondary | ICD-10-CM

## 2022-11-28 DIAGNOSIS — D860 Sarcoidosis of lung: Secondary | ICD-10-CM

## 2022-11-28 DIAGNOSIS — I471 Supraventricular tachycardia, unspecified: Secondary | ICD-10-CM

## 2022-11-28 DIAGNOSIS — I10 Essential (primary) hypertension: Secondary | ICD-10-CM | POA: Diagnosis not present

## 2022-11-28 DIAGNOSIS — I5032 Chronic diastolic (congestive) heart failure: Secondary | ICD-10-CM | POA: Diagnosis not present

## 2022-11-28 DIAGNOSIS — G8929 Other chronic pain: Secondary | ICD-10-CM

## 2022-11-28 DIAGNOSIS — R627 Adult failure to thrive: Secondary | ICD-10-CM | POA: Diagnosis not present

## 2022-11-28 DIAGNOSIS — M6281 Muscle weakness (generalized): Secondary | ICD-10-CM

## 2022-11-28 DIAGNOSIS — J449 Chronic obstructive pulmonary disease, unspecified: Secondary | ICD-10-CM

## 2022-11-28 DIAGNOSIS — D649 Anemia, unspecified: Secondary | ICD-10-CM | POA: Diagnosis not present

## 2022-11-28 DIAGNOSIS — R339 Retention of urine, unspecified: Secondary | ICD-10-CM

## 2022-11-29 DIAGNOSIS — E785 Hyperlipidemia, unspecified: Secondary | ICD-10-CM

## 2022-11-29 DIAGNOSIS — I340 Nonrheumatic mitral (valve) insufficiency: Secondary | ICD-10-CM

## 2022-11-29 DIAGNOSIS — D860 Sarcoidosis of lung: Secondary | ICD-10-CM

## 2022-11-29 DIAGNOSIS — J449 Chronic obstructive pulmonary disease, unspecified: Secondary | ICD-10-CM

## 2022-11-29 DIAGNOSIS — I480 Paroxysmal atrial fibrillation: Secondary | ICD-10-CM

## 2022-11-29 DIAGNOSIS — M4120 Other idiopathic scoliosis, site unspecified: Secondary | ICD-10-CM

## 2022-11-29 DIAGNOSIS — I5022 Chronic systolic (congestive) heart failure: Secondary | ICD-10-CM

## 2022-11-29 DIAGNOSIS — F32A Depression, unspecified: Secondary | ICD-10-CM

## 2022-11-29 DIAGNOSIS — K219 Gastro-esophageal reflux disease without esophagitis: Secondary | ICD-10-CM

## 2022-11-29 DIAGNOSIS — I10 Essential (primary) hypertension: Secondary | ICD-10-CM

## 2022-12-01 NOTE — Progress Notes (Signed)
CXR without acute process. Findings suggestive of chronic lung disease. Continue plan as discussed

## 2022-12-06 DIAGNOSIS — Z7689 Persons encountering health services in other specified circumstances: Secondary | ICD-10-CM | POA: Diagnosis not present

## 2022-12-06 DIAGNOSIS — R070 Pain in throat: Secondary | ICD-10-CM | POA: Diagnosis not present

## 2022-12-06 DIAGNOSIS — J449 Chronic obstructive pulmonary disease, unspecified: Secondary | ICD-10-CM | POA: Diagnosis not present

## 2022-12-06 DIAGNOSIS — R0982 Postnasal drip: Secondary | ICD-10-CM | POA: Diagnosis not present

## 2022-12-15 DIAGNOSIS — H9203 Otalgia, bilateral: Secondary | ICD-10-CM | POA: Diagnosis not present

## 2022-12-15 DIAGNOSIS — Z7689 Persons encountering health services in other specified circumstances: Secondary | ICD-10-CM | POA: Diagnosis not present

## 2022-12-15 DIAGNOSIS — R0982 Postnasal drip: Secondary | ICD-10-CM | POA: Diagnosis not present

## 2022-12-19 DIAGNOSIS — Z7689 Persons encountering health services in other specified circumstances: Secondary | ICD-10-CM | POA: Diagnosis not present

## 2022-12-19 DIAGNOSIS — R197 Diarrhea, unspecified: Secondary | ICD-10-CM | POA: Diagnosis not present

## 2022-12-20 DIAGNOSIS — G8929 Other chronic pain: Secondary | ICD-10-CM | POA: Diagnosis not present

## 2022-12-20 DIAGNOSIS — I340 Nonrheumatic mitral (valve) insufficiency: Secondary | ICD-10-CM | POA: Diagnosis not present

## 2022-12-20 DIAGNOSIS — M6281 Muscle weakness (generalized): Secondary | ICD-10-CM | POA: Diagnosis not present

## 2022-12-20 DIAGNOSIS — M25561 Pain in right knee: Secondary | ICD-10-CM

## 2022-12-20 DIAGNOSIS — D649 Anemia, unspecified: Secondary | ICD-10-CM | POA: Diagnosis not present

## 2022-12-26 DIAGNOSIS — M25561 Pain in right knee: Secondary | ICD-10-CM | POA: Diagnosis not present

## 2022-12-26 DIAGNOSIS — G8929 Other chronic pain: Secondary | ICD-10-CM

## 2022-12-26 DIAGNOSIS — I340 Nonrheumatic mitral (valve) insufficiency: Secondary | ICD-10-CM | POA: Diagnosis not present

## 2022-12-26 DIAGNOSIS — D649 Anemia, unspecified: Secondary | ICD-10-CM | POA: Diagnosis not present

## 2022-12-26 DIAGNOSIS — M6281 Muscle weakness (generalized): Secondary | ICD-10-CM | POA: Diagnosis not present

## 2022-12-28 ENCOUNTER — Other Ambulatory Visit: Payer: Self-pay | Admitting: *Deleted

## 2022-12-28 DIAGNOSIS — Z79899 Other long term (current) drug therapy: Secondary | ICD-10-CM

## 2022-12-28 DIAGNOSIS — Z5181 Encounter for therapeutic drug level monitoring: Secondary | ICD-10-CM

## 2022-12-30 ENCOUNTER — Ambulatory Visit: Payer: Medicare Other

## 2022-12-30 ENCOUNTER — Other Ambulatory Visit: Payer: Self-pay | Admitting: *Deleted

## 2022-12-30 ENCOUNTER — Encounter: Payer: Self-pay | Admitting: Physician Assistant

## 2022-12-30 ENCOUNTER — Ambulatory Visit: Payer: Medicare Other | Attending: Physician Assistant | Admitting: Physician Assistant

## 2022-12-30 VITALS — BP 120/54 | HR 54 | Ht 63.0 in | Wt 125.0 lb

## 2022-12-30 DIAGNOSIS — E782 Mixed hyperlipidemia: Secondary | ICD-10-CM | POA: Insufficient documentation

## 2022-12-30 DIAGNOSIS — Z79899 Other long term (current) drug therapy: Secondary | ICD-10-CM | POA: Diagnosis present

## 2022-12-30 DIAGNOSIS — D869 Sarcoidosis, unspecified: Secondary | ICD-10-CM | POA: Diagnosis present

## 2022-12-30 DIAGNOSIS — I2510 Atherosclerotic heart disease of native coronary artery without angina pectoris: Secondary | ICD-10-CM | POA: Insufficient documentation

## 2022-12-30 DIAGNOSIS — I5033 Acute on chronic diastolic (congestive) heart failure: Secondary | ICD-10-CM | POA: Diagnosis not present

## 2022-12-30 DIAGNOSIS — I10 Essential (primary) hypertension: Secondary | ICD-10-CM | POA: Insufficient documentation

## 2022-12-30 DIAGNOSIS — I480 Paroxysmal atrial fibrillation: Secondary | ICD-10-CM | POA: Insufficient documentation

## 2022-12-30 NOTE — Progress Notes (Signed)
Office Visit    Patient Name: [name] Date of Encounter: 12/30/2022  PCP:  Sherri Northern, MD    [facility]  Cardiologist:  Sherri Bollman, MD  Advanced Practice Provider:  Beatrice Lecher, PA-C Electrophysiologist:  None   HPI    [name] is a 88 y.o. female presents today for follow-up.   Patient Profile: Coronary artery disease Cath 3/17: oLCx 60-70 (neg by FFR) Myoview 11/16: no ischemia Myoview 07/2018: EF 73, prob low risk (significant extracardiac uptake), no ischemia Myoview 6/22: low risk  Cath 07/2021: LCx 60-70 (no ? from last cath), LAD 40, RCA 30 - Med Rx Paroxysmal atrial fibrillation  CHA2DS2-VASc=5 (CAD, HTN, age x 2, female) >> Apixaban 2.5 mg (> 66 yo, wt <60 kg)  (HFpEF) heart failure with preserved ejection fraction  Echo 8/21: EF 55-60, GR 1 DD, RVSP 24.2, trivial MR Echocardiogram 6/23: EF 55-60, Gr 2 DD Aortic atherosclerosis  Pulmonary sarcoidosis Echo 8/21: RVSP 24.2 Echo 5/21: RVSP 36.9 Bronchiectasis Hypertension Hyperlipidemia SVT Carotid stenosis [place] 06/2018: bilat 1-39 Dementia     The patient was seen by [name] January 2023 and ended up admitted 6/17 through 6/23 with chest pain and mildly elevated troponin level (23>> 33).  Echocardiogram showed normal EF.  Cardiac catheterization demonstrated normal anatomy without change from cath in 2017.  Medical prescription was recommended.  Reviewed of DC notes which indicated she noted progressive dyspnea and pulmonology evaluation was arranged.  There was also reported SVT by EMS.  Recommendation was to consider outpatient ZIO monitor.  She then saw [place] last summer for follow-up.  Since discharge she had done well.  She said [name] saw her last weekend and medications were adjusted.  She felt her breathing was better.  She had not had any further palpitations.  Blood pressure was running a little low and daughter had not given her the metoprolol.  No further  chest pain.  Had not had syncope, orthopnea, or leg edema.  Not currently on Lasix at that time.  She was seen by me 3/29 and her daughter tells me that in January she had RSV from the 17th to the 28th.  They sent her to [facility] since she needs rehab.  There, she had an aspiration pneumonia and did not do well.  2 weeks later got worse and she is struggling with her breathing.  She was on 6 L then down to 2 L at [facility].  She was there from January 28 to February 26.  She was found to be in uncontrolled atrial fibrillation and had urinary retention.  She was very sick at that time and sent to [facility].  There, she was told that she has some esophageal motility issues (hence the aspiration pneumonia) and she left [name] long March a bleeding with a catheter.  She had catheter removed 3/22.  A week later 140 mL of urine was found in her bladder and she was in and out cath.  On Cipro and she plans to get the culture back on Monday.  Last weekend she has some right foot swelling which then progressed to right and left foot swelling.  We went through several options to treat her lower extremity edema.  [name] then saw the patient 08/29/2022 and she had been hospitalized in May with acute on chronic systolic heart failure.  Chest x-ray showed pulmonary vascular congestion and her BNP was noted to be increased.  She was given  1 dose of Lasix and her medical regimen was adjusted.  Now she is managed with low-dose oral furosemide and was doing very well.  She did not have any recent problems with edema.  No recent chest pain at that time.  Does have some shortness of breath with physical activity.  She was seen in the ED in September for left shoulder pain and right knee pain (previously had surgery on her left shoulder).  X-rays were negative and unfortunately cannot take ibuprofen because she is on Eliquis.  Recommended Tylenol for pain every 6 hours with ice and a knee sleeve.  Follow-up with Ortho was  arranged   Today, she presents with a history of heart disease, including atrial fibrillation, arthritis, and a left total shoulder replacement. The patient reports feeling fatigued, which she attributes to her low heart rate, a side effect of her amiodarone medication. She also reports a decreased appetite, which she attributes to the quality of food at her care facility. She has been supplementing her diet with protein shakes. The patient also reports left-sided shoulder pain, which is likely due to her history of arthritis and shoulder replacement. The patient's daughter notes that the patient's knee is also causing her discomfort. Labs today included CMP and TSH.  Reports no shortness of breath nor dyspnea on exertion. Reports no chest pain, pressure, or tightness. No edema, orthopnea, PND. Reports no palpitations.    Past Medical History    Past Medical History:  Diagnosis Date   Acute maxillary sinusitis 10/17/2017   Acute posthemorrhagic anemia 08/22/2012   Acute renal failure syndrome (HCC) 01/04/2019   Allergy    SEASONAL   Anemia    Aortic atherosclerosis (HCC)    Arthritis    Asthma    "slight"    Baker's cyst of knee, right    CAD (coronary artery disease)    Carotid stenosis    Closed fracture of right olecranon process 05/09/2017   Closed left subtrochanteric femur fracture S/P Open/closed and reduction, internal medullary fixation  09/05/2012   Dyspnea    due to Sarcoidosis. When is windy or humed   Edema 10/30/2019   Elbow fracture 04/2017   Right, had surgery   Esophageal reflux    Fracture of inferior pubic ramus (HCC) 02/28/2019   Hematochezia    Hip fracture (HCC) 12/12/2018   HLD (hyperlipidemia)    Hx of colonoscopy    Hydronephrosis 07/29/2019   Left foot pain 09/02/2014   Assessment: 88 year old with acquired leg length discrepancy on the left who presents with 10 days of acute onset left foot pain. She seemed to be doing well prior to this pain with  her orthotics that had been made to correct her leg length discrepancy. Negative x-rays at outside office which were again reviewed today.   This pain does not seem to impair her function and which has actually been im   Macular pucker, right eye    will have removed on 01/26/21   Osteoporosis    Paroxysmal supraventricular tachycardia (HCC)    Pneumonia    Pyelonephritis 07/29/2019   Sacral insufficiency fracture 12/12/2018   Sarcoidosis    Sepsis (HCC) 07/30/2019   Sepsis due to Escherichia coli (e. 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   COLONOSCOPY     CYSTOSCOPY W/ RETROGRADES Left 07/30/2019   Procedure: CYSTOSCOPY WITH RETROGRADE PYELOGRAM LEFT STENT PLACEMENT;  Surgeon: Crist Fat, MD;  Location: [facility] ORS;  Service: Urology;  Laterality: Left;   CYSTOSCOPY WITH RETROGRADE PYELOGRAM, URETEROSCOPY AND STENT PLACEMENT Left 08/20/2019   Procedure: CYSTOSCOPY, URETEROSCOPY AND STENT PLACEMENT;  Surgeon: Noel Christmas, MD;  Location: [facility] ORS;  Service: Urology;  Laterality: Left;  1 HR   FOOT SURGERY Right    HIP SURGERY Right 2011   full replacement   HOLMIUM LASER APPLICATION Left 08/20/2019   Procedure: HOLMIUM LASER APPLICATION;  Surgeon: Noel Christmas, MD;  Location: [facility] ORS;  Service: Urology;  Laterality: Left;   LEFT HEART CATH AND CORONARY ANGIOGRAPHY N/A 08/09/2021   Procedure: LEFT HEART CATH AND CORONARY ANGIOGRAPHY;  Surgeon: Sherri Bollman, MD;  Location: [facility] INVASIVE CV LAB;  Service: Cardiovascular;  Laterality: N/A;   LEG SURGERY  Left 06/2012   femur fracture s/p open and closed reduction in [place], Dr. Mayford Knife   lens replacement Right    right eye   ORIF ELBOW FRACTURE Right 05/09/2017   Procedure: ORIF right olecranon fracture with repair/reconstruction, ulnar nerve transposition as needed;  Surgeon: Dominica Severin, MD;  Location: [facility] OR;  Service: Orthopedics;  Laterality: Right;  Requests 90 mins   pelvis fracture     POLYPECTOMY     REVERSE SHOULDER ARTHROPLASTY Left 06/13/2019   Procedure: REVERSE SHOULDER ARTHROPLASTY;  Surgeon: Francena Hanly, MD;  Location: [facility];  Service: Orthopedics;  Laterality: Left;    SHOULDER ARTHROSCOPY W/ ROTATOR CUFF REPAIR Right    TRAPEZIUM RESECTION Right     Allergies  Allergies  Allergen Reactions   Levaquin [Levofloxacin] Other (See Comments)    Joint pain.. Per doctor, told to not to take again and "allergic," per [facility]   Oxycodone Nausea And Vomiting and Other (See Comments)    Can take oxycodone with Zofran (otherwise, GI Intolerance)   Oxycodone-Acetaminophen Other (See Comments)    GI Intolerance and "talking out of my head" and "allergic," per [facility]   Sulfonamide Derivatives Other (See Comments)    Possibly caused hepatitis and "allergic," per [facility]   Sulfa Antibiotics Other (See Comments)    Possibly caused hepatitis and "allergic," per [facility]   Morphine Nausea Only and Other (See Comments)    GI intolerance and "allergic," per [facility]     EKGs/Labs/Other Studies Reviewed:   The following studies were reviewed today: Cardiac catheterization 08/09/21 1.  Mild nonobstructive plaquing in the RCA (dominant vessel), unchanged from previous study 2.  Patent left main with no significant stenosis 3.  Patent LAD with mild proximal vessel stenosis of 40% 4.  Moderate ostial circumflex stenosis estimated at 60 to 70%, unchanged from the previous study.    Echocardiogram 08/08/21 EF 55-60, no RWMA, Gr 2 DD, normal RVSF, normal PASP (RVSP 31.3), mild MR, mild to mod  TR, AV sclerosis w/o AS   GATED SPECT MYO PERF W/LEXISCAN STRESS 1D 08/05/2020 EF 72, normal perfusion, low risk   Echocardiogram 09/23/19 EF 55-60, GR 1 DD, RVSP 24.2, trivial MR   Carotid US 07/09/2018 Bilat ICA 1-39  EKG:  EKG is not ordered today.   Recent Labs: 04/18/2022: TSH 1.296 07/13/2022: B Natriuretic  Peptide 322.0 07/14/2022: ALT 12; Magnesium 1.7 07/19/2022: BUN 22; Creatinine, Ser 0.91; Hemoglobin 9.3; Platelets 343; Potassium 4.0; Sodium 136  Recent Lipid Panel    Component Value Date/Time   CHOL 188 08/08/2021 0124   CHOL 149 11/27/2017 0800   TRIG 81 08/08/2021 0124   HDL 67 08/08/2021 0124   HDL 73 11/27/2017 0800   CHOLHDL 2.8 08/08/2021 0124   VLDL 16 08/08/2021 0124   LDLCALC 105 (H) 08/08/2021 0124   LDLCALC 63 11/27/2017 0800     Home Medications   Current Meds  Medication Sig   acetaminophen (TYLENOL) 500 MG tablet Take 1,000 mg by mouth every 6 (six) hours as needed (for pain).   amiodarone (PACERONE) 100 MG tablet Take 1 tablet (100 mg total) by mouth daily.   Budeson-Glycopyrrol-Formoterol (BREZTRI AEROSPHERE) 160-9-4.8 MCG/ACT AERO Inhale 2 puffs into the lungs in the morning and at bedtime.   Calcium Carbonate Antacid (TUMS ULTRA 1000 PO) Take 1,000 mg by mouth every 12 (twelve) hours as needed (acid reflux/indigestion/heartburn).   cyanocobalamin (VITAMIN B12) 1000 MCG tablet Take 1,000 mcg by mouth daily.   ELIQUIS 2.5 MG TABS tablet TAKE 1 TABLET(2.5 MG) BY MOUTH TWICE DAILY (Patient taking differently: Take 2.5 mg by mouth in the morning and at bedtime.)   ferrous gluconate (FERGON) 324 MG tablet Take 1 tablet (324 mg total) by mouth daily with breakfast.   fluticasone (FLONASE) 50 MCG/ACT nasal spray Place 1 spray into both nostrils daily.   furosemide (LASIX) 20 MG tablet Take 1 tablet (20 mg total) by mouth daily.   guaiFENesin (MUCINEX) 600 MG 12 hr tablet Take 2 tablets (1,200 mg total) by mouth 2 (two) times daily.   ipratropium-albuterol  (DUONEB) 0.5-2.5 (3) MG/3ML SOLN Take 3 mLs by nebulization every 6 (six) hours as needed (wheezing; shortness of breath).   levofloxacin (LEVAQUIN) 250 MG tablet Take 250 mg by mouth daily.   levothyroxine (SYNTHROID) 50 MCG tablet Take 50 mcg by mouth daily.   loratadine (CLARITIN) 10 MG tablet Take 10 mg by mouth daily.   omeprazole (PRILOSEC) 40 MG capsule Take 40 mg by mouth in the morning and at bedtime.   oxybutynin (DITROPAN) 5 MG tablet Take 5 mg by mouth daily.   polyethylene glycol (MIRALAX / GLYCOLAX) 17 g packet Take 17 g by mouth daily as needed for mild constipation.   sertraline (ZOLOFT) 50 MG tablet Take 1 tablet (50 mg total) by mouth at bedtime.   traMADol (ULTRAM) 50 MG tablet Take 50 mg by mouth every 6 (six) hours as needed.     Review of Systems      All other systems reviewed and are otherwise negative except as noted above.  Physical Exam    VS:  BP (!) 120/54   Pulse (!) 54   Ht 5\' 3"  (1.6 m)   Wt 125 lb (56.7 kg)   LMP 02/22/1983 (Approximate)   SpO2 97%   BMI 22.14 kg/m  , BMI Body mass index is 22.14 kg/m.  Wt Readings from Last 3 Encounters:  12/30/22 125 lb (56.7 kg)  11/09/22 125 lb (56.7 kg)  08/29/22 125 lb (56.7 kg)     GEN: Well nourished, well developed, in no acute distress. HEENT: normal. Neck: Supple, no JVD, carotid bruits, or masses. Cardiac: RRR, no murmurs, rubs, or gallops. No clubbing, cyanosis, 1+ pitting edema R>L.  Radials/PT 2+ and equal bilaterally.  Respiratory:  Respirations regular and unlabored, clear to auscultation bilaterally. GI: Soft, nontender,  nondistended. MS: No deformity or atrophy. Skin: Warm and dry, no rash. Neuro:  Strength and sensation are intact. Psych: Normal affect.  Assessment & Plan    Chronic heart failure with preserved ejection fraction -Very mild edema of her right ankle but left is normal, much better than before -Reviewed recent echocardiogram -We recommended a low-sodium diet (hard to  do at her facility) -Recommended lower extremity compression and elevation of legs when able -Update CMP today -Daily weights  CAD, nonobstructive -No chest pain -Continue current medications -Not on aspirin since she is taking Eliquis -Recommended not taking any NSAIDs and sticking with Tylenol arthritis for pain  Essential hypertension -Well-controlled today 120/54 -Continue current medication regimen -Have the facility check blood pressure daily  Hyperlipidemia -We have ordered an updated lipid panel today -Continue current medications until labs result  Atrial Fibrillation Stable on low dose Amiodarone. Heart rate in the 50s for the past 6 months. Patient reports feeling sluggish which could be due to a multitude of factors including decreased activity and potential side effects from Amiodarone. -Continue Amiodarone at current dose. -She is in NSR today  Hypothyroidism On treatment, recent labs pending. -Review recent TSH results when available.  Chronic Pain Reports of chronic shoulder pain due to arthritis and a total shoulder replacement. Using lidocaine patches with limited relief. -Consider placement of lidocaine patches at the central point of pain.  Decreased Appetite Patient reports decreased appetite and poor food quality at the care facility. -Encourage small frequent meals throughout the day and hydration. -Consider nutritional shakes for additional protein and vitamins.  General Health Maintenance -Recent labs including CMP and TSH were drawn on 12/30/2022, results pending. -Follow up on hydration and nutritional status.        Disposition: She can follow-up in 6 months with [name] or myself  Signed, Sharlene Dory, PA-C 12/30/2022, 5:10 PM [facility]

## 2022-12-30 NOTE — Patient Instructions (Signed)
Medication Instructions:  Your physician recommends that you continue on your current medications as directed. Please refer to the Current Medication list given to you today.  *If you need a refill on your cardiac medications before your next appointment, please call your pharmacy*   Lab Work: None ordered  If you have labs (blood work) drawn today and your tests are completely normal, you will receive your results only by: MyChart Message (if you have MyChart) OR A paper copy in the mail If you have any lab test that is abnormal or we need to change your treatment, we will call you to review the results.   Testing/Procedures: None ordered   Follow-Up: At [facility], you and your health needs are our priority.  As part of our continuing mission to provide you with exceptional heart care, we have created designated Provider Care Teams.  These Care Teams include your primary Cardiologist (physician) and Advanced Practice Providers (APPs -  Physician Assistants and Nurse Practitioners) who all work together to provide you with the care you need, when you need it.  We recommend signing up for the patient portal called "MyChart".  Sign up information is provided on this After Visit Summary.  MyChart is used to connect with patients for Virtual Visits (Telemedicine).  Patients are able to view lab/test results, encounter notes, upcoming appointments, etc.  Non-urgent messages can be sent to your provider as well.   To learn more about what you can do with MyChart, go to [email or web address].    Your next appointment:   8 month(s)  Provider:   Tonny Bollman, MD     Other Instructions

## 2022-12-31 LAB — COMPREHENSIVE METABOLIC PANEL
ALT: 11 [IU]/L (ref 0–32)
AST: 16 [IU]/L (ref 0–40)
Albumin: 3.8 g/dL (ref 3.7–4.7)
Alkaline Phosphatase: 137 [IU]/L — ABNORMAL HIGH (ref 44–121)
BUN/Creatinine Ratio: 30 — ABNORMAL HIGH (ref 12–28)
BUN: 23 mg/dL (ref 8–27)
Bilirubin Total: 0.3 mg/dL (ref 0.0–1.2)
CO2: 22 mmol/L (ref 20–29)
Calcium: 9.3 mg/dL (ref 8.7–10.3)
Chloride: 102 mmol/L (ref 96–106)
Creatinine, Ser: 0.76 mg/dL (ref 0.57–1.00)
Globulin, Total: 2 g/dL (ref 1.5–4.5)
Glucose: 88 mg/dL (ref 70–99)
Potassium: 4 mmol/L (ref 3.5–5.2)
Sodium: 141 mmol/L (ref 134–144)
Total Protein: 5.8 g/dL — ABNORMAL LOW (ref 6.0–8.5)
eGFR: 76 mL/min/{1.73_m2} (ref >59)

## 2022-12-31 LAB — TSH: TSH: 32 u[IU]/mL — ABNORMAL HIGH (ref 0.450–4.500)

## 2023-01-03 ENCOUNTER — Other Ambulatory Visit: Payer: Self-pay

## 2023-01-09 DIAGNOSIS — J329 Chronic sinusitis, unspecified: Secondary | ICD-10-CM | POA: Diagnosis not present

## 2023-01-09 DIAGNOSIS — H9203 Otalgia, bilateral: Secondary | ICD-10-CM | POA: Diagnosis not present

## 2023-01-09 DIAGNOSIS — Z7689 Persons encountering health services in other specified circumstances: Secondary | ICD-10-CM | POA: Diagnosis not present

## 2023-01-09 DIAGNOSIS — E064 Drug-induced thyroiditis: Secondary | ICD-10-CM | POA: Diagnosis not present

## 2023-01-16 DIAGNOSIS — H9203 Otalgia, bilateral: Secondary | ICD-10-CM | POA: Diagnosis not present

## 2023-01-16 DIAGNOSIS — Z7689 Persons encountering health services in other specified circumstances: Secondary | ICD-10-CM | POA: Diagnosis not present

## 2023-01-16 DIAGNOSIS — J329 Chronic sinusitis, unspecified: Secondary | ICD-10-CM | POA: Diagnosis not present

## 2023-01-16 DIAGNOSIS — E064 Drug-induced thyroiditis: Secondary | ICD-10-CM | POA: Diagnosis not present

## 2023-02-01 DIAGNOSIS — R2689 Other abnormalities of gait and mobility: Secondary | ICD-10-CM

## 2023-02-01 DIAGNOSIS — M25561 Pain in right knee: Secondary | ICD-10-CM

## 2023-02-01 DIAGNOSIS — G8929 Other chronic pain: Secondary | ICD-10-CM

## 2023-02-01 DIAGNOSIS — D649 Anemia, unspecified: Secondary | ICD-10-CM

## 2023-02-01 DIAGNOSIS — H9203 Otalgia, bilateral: Secondary | ICD-10-CM

## 2023-02-01 DIAGNOSIS — I340 Nonrheumatic mitral (valve) insufficiency: Secondary | ICD-10-CM

## 2023-02-01 DIAGNOSIS — M6281 Muscle weakness (generalized): Secondary | ICD-10-CM

## 2023-02-17 DIAGNOSIS — J329 Chronic sinusitis, unspecified: Secondary | ICD-10-CM | POA: Diagnosis not present

## 2023-02-17 DIAGNOSIS — Z7689 Persons encountering health services in other specified circumstances: Secondary | ICD-10-CM | POA: Diagnosis not present

## 2023-02-17 DIAGNOSIS — H9203 Otalgia, bilateral: Secondary | ICD-10-CM | POA: Diagnosis not present

## 2023-03-09 DIAGNOSIS — K117 Disturbances of salivary secretion: Secondary | ICD-10-CM | POA: Insufficient documentation

## 2023-03-14 DIAGNOSIS — Z7689 Persons encountering health services in other specified circumstances: Secondary | ICD-10-CM | POA: Diagnosis not present

## 2023-03-14 DIAGNOSIS — E064 Drug-induced thyroiditis: Secondary | ICD-10-CM | POA: Diagnosis not present

## 2023-03-14 DIAGNOSIS — N390 Urinary tract infection, site not specified: Secondary | ICD-10-CM | POA: Diagnosis not present

## 2023-03-16 DIAGNOSIS — N390 Urinary tract infection, site not specified: Secondary | ICD-10-CM

## 2023-03-16 DIAGNOSIS — Z7689 Persons encountering health services in other specified circumstances: Secondary | ICD-10-CM

## 2023-03-16 DIAGNOSIS — E064 Drug-induced thyroiditis: Secondary | ICD-10-CM

## 2023-03-23 ENCOUNTER — Ambulatory Visit: Payer: 59 | Admitting: Nurse Practitioner

## 2023-03-23 ENCOUNTER — Ambulatory Visit: Payer: Medicare Other | Admitting: Nurse Practitioner

## 2023-03-23 ENCOUNTER — Encounter: Payer: Self-pay | Admitting: Nurse Practitioner

## 2023-03-23 VITALS — BP 120/78 | HR 68 | Ht 63.0 in | Wt 120.0 lb

## 2023-03-23 DIAGNOSIS — J449 Chronic obstructive pulmonary disease, unspecified: Secondary | ICD-10-CM

## 2023-03-23 DIAGNOSIS — R5381 Other malaise: Secondary | ICD-10-CM | POA: Diagnosis not present

## 2023-03-23 DIAGNOSIS — J479 Bronchiectasis, uncomplicated: Secondary | ICD-10-CM | POA: Diagnosis not present

## 2023-03-23 NOTE — Progress Notes (Signed)
@Patient  ID: [name], female    DOB: [birth year], 89 y.o.   MRN: [id number]  Chief Complaint  Patient presents with   Follow-up    Referring provider: Mindi Slicker, NP  HPI: 89 year old female, never smoker followed for severe obstructive lung disease and bronchiectasis associated with sarcoidosis.  She has patient Dr. Kavin Leech and last seen in office 11/09/2022 by [facility] NP.  Past medical history significant for chronic rhinitis, GERD, A-fib on Eliquis, CHF.  TEST/EVENTS:  11/27/2013 PFTs: FVC 73, FEV1 52, ratio 54, TLC 86, DLCO 41 08/08/2021 echocardiogram: EF 55 to 60%.  G2 DD.  RV size and function is normal.  Normal PASP.  There is mild MR.  TR is mild to moderate. 08/10/2021 CT chest without contrast: Atherosclerosis.  Calcified mediastinal lymph nodes are noted most consistent with prior granulomatous disease.  There is a small sliding-type hiatal hernia.  There is a very small left-sided pleural effusion.  Stable right apical and basilar scarring is noted as well.  10/07/2021 CXR 2 view: stable chronic lung disease/emphysema.  07/13/2022 CXR: cardiomegaly. Vascular congestion. Small b/l pleural effusions. Hyperinflation  11/09/2022 CXR: Retrocardiac opacity, consistent with known small hiatal hernia.  Lungs hyperinflated.  No pleural effusion or focal consolidation.  08/17/2021: OV with Dr. Delton Coombes.  Hospitalized earlier this month for chest discomfort superimposed on chronic progressive dyspnea with a mild troponin elevation.  She underwent left heart catheterization that showed stable significant CAD (unchanged).  She also is a history of paroxysmal SVT, unclear whether this was a factor during this admission.  There is no evidence of a COPD exacerbation.  A CT was performed which showed some calcified mediastinal adenopathy consistent with granulomatous disease, small right apical and basilar scarring without any evidence of pneumonia.  There is also a small left pleural effusion and  hiatal hernia.  Functional capacity and exertional shortness of breath have both progressively worsened over the last 6 months.  She has been on Symbicort; recommended step up to [place].  10/07/2021: OV with Kierstin January NP for follow-up.  She did get some benefit from step up to [facility] after her last visit.  However, they report that over the last week or so, she has had some increased shortness of breath with exertion.  Gets winded walking any sort of distance.  They do not feel like her activity tolerance is what it used to be.  She has an occasional cough, which was slightly increased yesterday but today seems more so back to her baseline.  Nonproductive.  Her great granddaughter did recently get diagnosed with croup.  They are unsure if this is related.  Nasal congestion and drainage stable.  No significant wheezing, chest tightness.  She does not have any hemoptysis.  Denies any fevers, night sweats, chills, increased fatigue, lower extremity swelling, palpitations, orthopnea.  Using rescue a few times a week. She has had a progressive decline in her respiratory status over the last 6 months.  CT chest from June was stable.  She was started on Breztri which she did get some benefit from.  Seems that she could be having an acute exacerbation of her COPD related to exposure to viral infection.  We will treat her with prednisone burst to see if she has any perceived benefit from this.  Recommended that she continue triple therapy and as needed albuterol.  CXR today to rule out superimposed infection.  10/22/2021: OV with Natasia Sanko NP for follow-up after being treated for potential AECOPD versus progression of  disease.  She reports that she feels relatively unchanged.  She actually ended up never taking the prednisone.  She took 1 dose and then forgot to take the rest of them.  Her daughter had gone out of town with her husband and had left the medicine out but the patient did not realize that she was post to be taking it.   She does tend to have some forgetfulness.  Does state that she never forgets to take her inhaler and her other medicines are in her pillbox.  She did not notice any significant benefit from the 1 dose that she did take.  Her breathing overall feels the same.  She gets winded with minimal activity, which has been her baseline for about the past 6 months.  Previous imaging did not show any evidence of progression of disease.  She has an occasional nonproductive cough , which is her baseline.  She denies any significant chest congestion or wheezing.  Denies any fevers, chills, skin lesions, vision changes, hemoptysis, weight loss, anorexia, orthopnea, lower extremity swelling.  She continues on [employer] inhaler.  Does not have a nebulizer machine.  She does use albuterol daily.  Walking oximetry at previous visit without desaturations.  03/04/2022: OV with Johnanthony Wilden Np for acute visit with her daughter. She started feeling bad over the past few days. She developed nasal congestion and drainage with yellow mucus. She had a cough that was productive with yellow sputum. She was also feeling more fatigued. Then today she started having increased shortness of breath, chest congestion, and wheezing. She has ear pain/pressure that is new, worse on the left side. No fevers, chills, hemoptysis. Eating and drinking well. She was with her great grandchild on Sunday, who became sick earlier this week and has pneumonia. They were told she was negative for flu, RSV, and COVID. They would like to see if she could be tested for this. She is still using her Breztri inhaler. Taking mucinex at home.   11/09/2022: OV with Tyrome Donatelli NP for overdue follow up with her daughter. She has had two hospitalizations since she was here last. February was admitted for a fib RVR. Again admitted in May for acute CHF exacerbation and volume overload. She required oxygen therapy. Once they were able to successfully diuresis her and address her urine retention,  she was able to get off of oxygen. Has not required any supplemental O2 the past few months. She is currently in assisted living. From a breathing standpoint, she's been doing better. No significant cough, chest congestion or wheezing. She is relatively sedentary due to chronic knee pain from severe osteoarthritis. Not having to use albuterol. Takes Breztri twice a day. Currently in the coverage gap so this is expensive.   03/23/2023: Today-follow-up Patient presents today for follow-up with her daughter.  Breathing has been doing relatively well since she was here last.  She has not had any exacerbations requiring steroids or antibiotics.  Has not had any issues with her heart either or further admissions for CHF exacerbations.  She has had an overall decline in her mobility.  She is also had some difficulties with her thyroid levels, which her PCP is trying to manage.  They may end up sending her to endocrinology if they cannot get her thyroid levels normalized. She does have any significant cough.  Does get short winded when she starts doing more activity but otherwise does fine when she is at rest and has assistance.  Primarily wheelchair-bound.  No fevers,  chills, hemoptysis, chest congestion, wheezing.  Not having to use albuterol.  Still on her Breztri twice a day.  Allergies  Allergen Reactions   Levaquin [Levofloxacin] Other (See Comments)    Joint pain.. Per doctor, told to not to take again and "allergic," per [facility]   Oxycodone Nausea And Vomiting and Other (See Comments)    Can take oxycodone with Zofran (otherwise, GI Intolerance)   Oxycodone-Acetaminophen Other (See Comments)    GI Intolerance and "talking out of my head" and "allergic," per [facility]   Sulfonamide Derivatives Other (See Comments)    Possibly caused hepatitis and "allergic," per [facility]   Sulfa Antibiotics Other (See Comments)    Possibly caused hepatitis and "allergic," per [facility]   Morphine Nausea Only and Other (See Comments)     GI intolerance and "allergic," per [facility]    Immunization History  Administered Date(s) Administered   Fluad Quad(high Dose 65+) 11/19/2019, 10/23/2022   Influenza Inj Mdck Quad Pf 11/05/2018   Influenza Split 10/28/2008, 11/23/2009, 11/17/2010, 11/29/2011, 10/18/2012, 11/18/2013   Influenza, High Dose Seasonal PF 11/02/2016, 11/04/2017, 02/22/2022   Influenza, Quadrivalent, Recombinant, Inj, Pf 11/04/2017, 11/29/2018   Influenza,inj,Quad PF,6+ Mos 11/18/2013, 11/12/2014   Influenza,inj,quad, With Preservative 11/26/2018, 12/24/2019   Influenza-Unspecified 11/05/2013, 11/06/2014, 12/07/2015   PFIZER(Purple Top)SARS-COV-2 Vaccination 03/14/2019, 04/04/2019, 12/12/2019   Pneumococcal Conjugate-13 02/25/2014   Pneumococcal Polysaccharide-23 05/03/2001   Pneumococcal-Unspecified 05/03/2001, 02/03/2016   Td 10/18/2011   Tdap 01/02/2012, 01/14/2012   Zoster, Live 10/18/2012, 11/08/2013    Past Medical History:  Diagnosis Date   Acute maxillary sinusitis 10/17/2017   Acute posthemorrhagic anemia 08/22/2012   Acute renal failure syndrome (HCC) 01/04/2019   Allergy    SEASONAL   Anemia    Aortic atherosclerosis (HCC)    Arthritis    Asthma    "slight"    Baker's cyst of knee, right    CAD (coronary artery disease)    Carotid stenosis    Closed fracture of right olecranon process 05/09/2017   Closed left subtrochanteric femur fracture S/P Open/closed and reduction, internal medullary fixation  09/05/2012   Dyspnea    due to Sarcoidosis. When is windy or humed   Edema 10/30/2019   Elbow fracture 04/2017   Right, had surgery   Esophageal reflux    Fracture of inferior pubic ramus (HCC) 02/28/2019   Hematochezia    Hip fracture (HCC) 12/12/2018   HLD (hyperlipidemia)    Hx of colonoscopy    Hydronephrosis 07/29/2019   Left foot pain 09/02/2014   Assessment: 89 year old with acquired leg length discrepancy on the left who presents with 10 days of acute onset left foot pain. She  seemed to be doing well prior to this pain with her orthotics that had been made to correct her leg length discrepancy. Negative x-rays at outside office which were again reviewed today.   This pain does not seem to impair her function and which has actually been im   Macular pucker, right eye    will have removed on 01/26/21   Osteoporosis    Paroxysmal supraventricular tachycardia (HCC)    Pneumonia    Pyelonephritis 07/29/2019   Sacral insufficiency fracture 12/12/2018   Sarcoidosis    Sepsis (HCC) 07/30/2019   Sepsis due to Escherichia coli (e. coli) (HCC) 07/29/2019   Sepsis due to urinary tract infection (HCC) 07/18/2019    Tobacco History: Social History   Tobacco Use  Smoking Status Never  Smokeless Tobacco Never   Counseling given: Not Answered  Outpatient Medications Prior to Visit  Medication Sig Dispense Refill   acetaminophen (TYLENOL) 500 MG tablet Take 1,000 mg by mouth every 6 (six) hours as needed (for pain).     Budeson-Glycopyrrol-Formoterol (BREZTRI AEROSPHERE) 160-9-4.8 MCG/ACT AERO Inhale 2 puffs into the lungs in the morning and at bedtime. 10.7 g 5   Calcium Carbonate Antacid (TUMS ULTRA 1000 PO) Take 1,000 mg by mouth every 12 (twelve) hours as needed (acid reflux/indigestion/heartburn).     cyanocobalamin (VITAMIN B12) 1000 MCG tablet Take 1,000 mcg by mouth daily.     ferrous gluconate (FERGON) 324 MG tablet Take 1 tablet (324 mg total) by mouth daily with breakfast.  3   fluticasone (FLONASE) 50 MCG/ACT nasal spray Place 1 spray into both nostrils daily.     furosemide (LASIX) 20 MG tablet Take 1 tablet (20 mg total) by mouth daily.     guaiFENesin (MUCINEX) 600 MG 12 hr tablet Take 2 tablets (1,200 mg total) by mouth 2 (two) times daily. 30 tablet 0   ipratropium-albuterol (DUONEB) 0.5-2.5 (3) MG/3ML SOLN Take 3 mLs by nebulization every 6 (six) hours as needed (wheezing; shortness of breath). 75 mL 5   levothyroxine (SYNTHROID) 50 MCG tablet Take 50  mcg by mouth daily.     loratadine (CLARITIN) 10 MG tablet Take 10 mg by mouth daily.     Melatonin 5 MG CHEW Chew 1 mg by mouth at bedtime.     methimazole (TAPAZOLE) 10 MG tablet Take 10 mg by mouth daily.     omeprazole (PRILOSEC) 40 MG capsule Take 40 mg by mouth in the morning and at bedtime.     oxybutynin (DITROPAN) 5 MG tablet Take 5 mg by mouth daily.     polyethylene glycol (MIRALAX / GLYCOLAX) 17 g packet Take 17 g by mouth daily as needed for mild constipation.     sertraline (ZOLOFT) 50 MG tablet Take 1 tablet (50 mg total) by mouth at bedtime. 30 tablet 1   traMADol (ULTRAM) 50 MG tablet Take 50 mg by mouth every 6 (six) hours as needed.     ELIQUIS 2.5 MG TABS tablet TAKE 1 TABLET(2.5 MG) BY MOUTH TWICE DAILY (Patient taking differently: Take 2.5 mg by mouth in the morning and at bedtime.) 180 tablet 1   levofloxacin (LEVAQUIN) 250 MG tablet Take 250 mg by mouth daily.     No facility-administered medications prior to visit.     Review of Systems:   Constitutional: No weight loss or gain, night sweats, fevers, chills, or lassitude. + Fatigue (baseline), lassitude  HEENT: No headaches, difficulty swallowing, tooth/dental problems, or sore throat. No sneezing, itching, nasal congestion, ear pressure/pain CV:  No chest pain, orthopnea, PND, swelling in lower extremities, anasarca, dizziness, palpitations, syncope Resp: +shortness of breath with exertion (baseline). No cough. No wheezing. No hemoptysis.  No chest wall deformity GI:  No heartburn, indigestion GU: No dysuria, change in color of urine, urgency or frequency.  Skin: No rash, lesions, ulcerations MSK:  +chronic knee pain; decreased mobility  Neuro: No dizziness or lightheadedness. +memory impairment  Psych: No depression or anxiety. Mood stable.     Physical Exam:  BP 120/78 (BP Location: Right Arm, Patient Position: Sitting, Cuff Size: Normal)   Pulse 68   Ht 5\' 3"  (1.6 m)   Wt 120 lb (54.4 kg)   LMP  02/22/1983 (Approximate)   SpO2 96%   BMI 21.26 kg/m   GEN: Pleasant, interactive, well-kempt; elderly; in no acute distress. HEENT:  Normocephalic and atraumatic. PERRLA. Sclera white. Nasal turbinates pink, moist and patent bilaterally. No rhinorrhea present. Oropharynx pink and moist, without exudate or edema. No lesions, ulcerations, or postnasal drip.  NECK:  Supple w/ fair ROM. No JVD present. Thyroid symmetrical with no goiter or nodules palpated. No lymphadenopathy.   CV: RRR, no m/r/g, no peripheral edema. Pulses intact, +2 bilaterally. No cyanosis, pallor or clubbing. PULMONARY:  Unlabored, regular breathing. Clear bilaterally A&P w/o wheezes/rales/rhonchi. No accessory muscle use. No dullness to percussion. GI: BS present and normoactive. Soft, non-tender to palpation. No organomegaly or masses detected.  MSK: No erythema, warmth or tenderness. Cap refil <2 sec all extrem.  Neuro: A/Ox3. No focal deficits noted.   Skin: Warm, no lesions or rashe Psych: Normal affect and behavior. Judgement and thought content appropriate.     Lab Results:  CBC    Component Value Date/Time   WBC 8.1 07/19/2022 0410   RBC 3.28 (L) 07/19/2022 0410   HGB 9.3 (L) 07/19/2022 0410   HGB 12.7 07/21/2020 1315   HCT 30.0 (L) 07/19/2022 0410   HCT 38.4 07/21/2020 1315   PLT 343 07/19/2022 0410   PLT 309 07/21/2020 1315   MCV 91.5 07/19/2022 0410   MCV 94 07/21/2020 1315   MCH 28.4 07/19/2022 0410   MCHC 31.0 07/19/2022 0410   RDW 15.8 (H) 07/19/2022 0410   RDW 12.7 07/21/2020 1315   LYMPHSABS 2.2 07/19/2022 0410   MONOABS 0.8 07/19/2022 0410   EOSABS 0.2 07/19/2022 0410   BASOSABS 0.1 07/19/2022 0410    BMET    Component Value Date/Time   NA 141 12/30/2022 1641   K 4.0 12/30/2022 1641   CL 102 12/30/2022 1641   CO2 22 12/30/2022 1641   GLUCOSE 88 12/30/2022 1641   GLUCOSE 91 07/19/2022 0410   BUN 23 12/30/2022 1641   CREATININE 0.76 12/30/2022 1641   CREATININE 0.85 05/18/2015  1028   CALCIUM 9.3 12/30/2022 1641   GFRNONAA >60 07/19/2022 0410   GFRAA 78 11/26/2019 1234    BNP    Component Value Date/Time   BNP 322.0 (H) 07/13/2022 1500     Imaging:  No results found.  Administration History     None          Latest Ref Rng & Units 11/27/2013    3:33 PM  PFT Results  FVC-Pre L 1.89   FVC-Predicted Pre % 73   FVC-Post L 2.01   FVC-Predicted Post % 77   Pre FEV1/FVC % % 54   Post FEV1/FCV % % 54   FEV1-Pre L 1.02   FEV1-Predicted Pre % 52   FEV1-Post L 1.09   DLCO uncorrected ml/min/mmHg 9.47   DLCO UNC% % 41   DLCO corrected ml/min/mmHg 9.47   DLCO COR %Predicted % 41   DLVA Predicted % 69   TLC L 4.22   TLC % Predicted % 86   RV % Predicted % 95     No results found for: "NITRICOXIDE"      Assessment & Plan:   COPD (chronic obstructive pulmonary disease) (HCC) Severe obstructive lung disease secondary to bronchiectasis associated with sarcoidosis.  Compensated on current regimen.  No recent exacerbations requiring steroids or antibiotics.  No hospitalizations.  Maintained on triple therapy regimen with Breztri.  Encouraged to work on graded exercises as much as possible.  Frequent, high-protein meals.  Stay up-to-date on vaccines.  Action plan in place.  Patient Instructions  Continue Breztri 2 puffs Twice daily. Brush  tongue and rinse mouth afterwards Continue Albuterol inhaler 2 puffs or 3 mL neb every 6 hours as needed for shortness of breath or wheezing. Notify if symptoms persist despite rescue inhaler/neb use.  Continue guaifenesin (mucinex) twice a day as needed for cough/ congestion  Continue Saline nasal rinses 1-2 times a day for nasal congestion. Continue Flonase nasal spray 2 sprays each nostril daily until sinus congestion improves   Follow up in 6 months with [name] or Florentina Addison Nayeliz Hipp,NP.  If symptoms do not improve or worsen, please contact office for sooner follow up or seek emergency care.   Bronchiectasis  without complication (HCC) See above.  Continue mucociliary clearance therapies as needed.  Monitor for signs and symptoms of infection/increased cough with sputum production.  Physical deconditioning Progressive decline in mobility over the last 6 months to a year.  She is now at skilled living.  Was not progressing with physical therapy so they stopped her treatment sessions.  She is primarily wheelchair-bound at this point.  Encouraged to work on chair exercises much as possible.  Followed by palliative care. DNR/DNI    I spent 25 minutes of dedicated to the care of this patient on the date of this encounter to include pre-visit review of records, face-to-face time with the patient discussing conditions above, post visit ordering of testing, clinical documentation with the electronic health record, making appropriate referrals as documented, and communicating necessary findings to members of the patients care team.  Noemi Chapel, NP 03/23/2023  Pt aware and understands NP's role.

## 2023-03-23 NOTE — Assessment & Plan Note (Signed)
Severe obstructive lung disease secondary to bronchiectasis associated with sarcoidosis.  Compensated on current regimen.  No recent exacerbations requiring steroids or antibiotics.  No hospitalizations.  Maintained on triple therapy regimen with Breztri.  Encouraged to work on graded exercises as much as possible.  Frequent, high-protein meals.  Stay up-to-date on vaccines.  Action plan in place.  Patient Instructions  Continue Breztri 2 puffs Twice daily. Brush tongue and rinse mouth afterwards Continue Albuterol inhaler 2 puffs or 3 mL neb every 6 hours as needed for shortness of breath or wheezing. Notify if symptoms persist despite rescue inhaler/neb use.  Continue guaifenesin (mucinex) twice a day as needed for cough/ congestion  Continue Saline nasal rinses 1-2 times a day for nasal congestion. Continue Flonase nasal spray 2 sprays each nostril daily until sinus congestion improves   Follow up in 6 months with [name] or Florentina Addison Jayshaun Phillips,NP.  If symptoms do not improve or worsen, please contact office for sooner follow up or seek emergency care.

## 2023-03-23 NOTE — Progress Notes (Signed)
@Patient  ID: [name], female    DOB: October 21, 1934, 89 y.o.   MRN: [id number]  Chief Complaint  Patient presents with   Follow-up    Referring provider: Mindi Slicker, NP  HPI: 89 year old female, never smoker followed for severe obstructive lung disease and bronchiectasis associated with sarcoidosis.  She has patient Dr. Kavin Leech and last seen in office 03/04/2022 by [facility] NP.  Past medical history significant for chronic rhinitis, GERD, A-fib on Eliquis, CHF.  TEST/EVENTS:  11/27/2013 PFTs: FVC 73, FEV1 52, ratio 54, TLC 86, DLCO 41 08/08/2021 echocardiogram: EF 55 to 60%.  G2 DD.  RV size and function is normal.  Normal PASP.  There is mild MR.  TR is mild to moderate. 08/10/2021 CT chest without contrast: Atherosclerosis.  Calcified mediastinal lymph nodes are noted most consistent with prior granulomatous disease.  There is a small sliding-type hiatal hernia.  There is a very small left-sided pleural effusion.  Stable right apical and basilar scarring is noted as well.  10/07/2021 CXR 2 view: stable chronic lung disease/emphysema.  07/13/2022 CXR: cardiomegaly. Vascular congestion. Small b/l pleural effusions. Hyperinflation   08/17/2021: OV with Dr. Delton Coombes.  Hospitalized earlier this month for chest discomfort superimposed on chronic progressive dyspnea with a mild troponin elevation.  She underwent left heart catheterization that showed stable significant CAD (unchanged).  She also is a history of paroxysmal SVT, unclear whether this was a factor during this admission.  There is no evidence of a COPD exacerbation.  A CT was performed which showed some calcified mediastinal adenopathy consistent with granulomatous disease, small right apical and basilar scarring without any evidence of pneumonia.  There is also a small left pleural effusion and hiatal hernia.  Functional capacity and exertional shortness of breath have both progressively worsened over the last 6 months.  She has been on  Symbicort; recommended step up to [place].  10/07/2021: OV with Shine Scrogham NP for follow-up.  She did get some benefit from step up to [facility] after her last visit.  However, they report that over the last week or so, she has had some increased shortness of breath with exertion.  Gets winded walking any sort of distance.  They do not feel like her activity tolerance is what it used to be.  She has an occasional cough, which was slightly increased yesterday but today seems more so back to her baseline.  Nonproductive.  Her great granddaughter did recently get diagnosed with croup.  They are unsure if this is related.  Nasal congestion and drainage stable.  No significant wheezing, chest tightness.  She does not have any hemoptysis.  Denies any fevers, night sweats, chills, increased fatigue, lower extremity swelling, palpitations, orthopnea.  Using rescue a few times a week. She has had a progressive decline in her respiratory status over the last 6 months.  CT chest from June was stable.  She was started on Breztri which she did get some benefit from.  Seems that she could be having an acute exacerbation of her COPD related to exposure to viral infection.  We will treat her with prednisone burst to see if she has any perceived benefit from this.  Recommended that she continue triple therapy and as needed albuterol.  CXR today to rule out superimposed infection.  10/22/2021: OV with Linlee Cromie NP for follow-up after being treated for potential AECOPD versus progression of disease.  She reports that she feels relatively unchanged.  She actually ended up never taking the prednisone.  She  took 1 dose and then forgot to take the rest of them.  Her daughter had gone out of town with her husband and had left the medicine out but the patient did not realize that she was post to be taking it.  She does tend to have some forgetfulness.  Does state that she never forgets to take her inhaler and her other medicines are in her pillbox.   She did not notice any significant benefit from the 1 dose that she did take.  Her breathing overall feels the same.  She gets winded with minimal activity, which has been her baseline for about the past 6 months.  Previous imaging did not show any evidence of progression of disease.  She has an occasional nonproductive cough , which is her baseline.  She denies any significant chest congestion or wheezing.  Denies any fevers, chills, skin lesions, vision changes, hemoptysis, weight loss, anorexia, orthopnea, lower extremity swelling.  She continues on [employer] inhaler.  Does not have a nebulizer machine.  She does use albuterol daily.  Walking oximetry at previous visit without desaturations.  03/04/2022: OV with Anvay Tennis Np for acute visit with her daughter. She started feeling bad over the past few days. She developed nasal congestion and drainage with yellow mucus. She had a cough that was productive with yellow sputum. She was also feeling more fatigued. Then today she started having increased shortness of breath, chest congestion, and wheezing. She has ear pain/pressure that is new, worse on the left side. No fevers, chills, hemoptysis. Eating and drinking well. She was with her great grandchild on Sunday, who became sick earlier this week and has pneumonia. They were told she was negative for flu, RSV, and COVID. They would like to see if she could be tested for this. She is still using her Breztri inhaler. Taking mucinex at home.   11/09/2022: Today - follow up Patient presents today for overdue follow up with her daughter. She has had two hospitalizations since she was here last. February was admitted for a fib RVR. Again admitted in May for acute CHF exacerbation and volume overload. She required oxygen therapy. Once they were able to successfully diuresis her and address her urine retention, she was able to get off of oxygen. Has not required any supplemental O2 the past few months. She is currently in  assisted living. From a breathing standpoint, she's been doing better. No significant cough, chest congestion or wheezing. She is relatively sedentary due to chronic knee pain from severe osteoarthritis. Not having to use albuterol. Takes Breztri twice a day. Currently in the coverage gap so this is expensive.   Allergies  Allergen Reactions   Levaquin [Levofloxacin] Other (See Comments)    Joint pain.. Per doctor, told to not to take again and "allergic," per [facility]   Oxycodone Nausea And Vomiting and Other (See Comments)    Can take oxycodone with Zofran (otherwise, GI Intolerance)   Oxycodone-Acetaminophen Other (See Comments)    GI Intolerance and "talking out of my head" and "allergic," per [facility]   Sulfonamide Derivatives Other (See Comments)    Possibly caused hepatitis and "allergic," per [facility]   Sulfa Antibiotics Other (See Comments)    Possibly caused hepatitis and "allergic," per [facility]   Morphine Nausea Only and Other (See Comments)    GI intolerance and "allergic," per [facility]    Immunization History  Administered Date(s) Administered   Fluad Quad(high Dose 65+) 11/19/2019, 10/23/2022   Influenza Inj Mdck Quad Pf  11/05/2018   Influenza Split 10/28/2008, 11/23/2009, 11/17/2010, 11/29/2011, 10/18/2012, 11/18/2013   Influenza, High Dose Seasonal PF 11/02/2016, 11/04/2017, 02/22/2022   Influenza, Quadrivalent, Recombinant, Inj, Pf 11/04/2017, 11/29/2018   Influenza,inj,Quad PF,6+ Mos 11/18/2013, 11/12/2014   Influenza,inj,quad, With Preservative 11/26/2018, 12/24/2019   Influenza-Unspecified 11/05/2013, 11/06/2014, 12/07/2015   PFIZER(Purple Top)SARS-COV-2 Vaccination 03/14/2019, 04/04/2019, 12/12/2019   Pneumococcal Conjugate-13 02/25/2014   Pneumococcal Polysaccharide-23 05/03/2001   Pneumococcal-Unspecified 05/03/2001, 02/03/2016   Td 10/18/2011   Tdap 01/02/2012, 01/14/2012   Zoster, Live 10/18/2012, 11/08/2013    Past Medical History:  Diagnosis Date   Acute maxillary sinusitis  10/17/2017   Acute posthemorrhagic anemia 08/22/2012   Acute renal failure syndrome (HCC) 01/04/2019   Allergy    SEASONAL   Anemia    Aortic atherosclerosis (HCC)    Arthritis    Asthma    "slight"    Baker's cyst of knee, right    CAD (coronary artery disease)    Carotid stenosis    Closed fracture of right olecranon process 05/09/2017   Closed left subtrochanteric femur fracture S/P Open/closed and reduction, internal medullary fixation  09/05/2012   Dyspnea    due to Sarcoidosis. When is windy or humed   Edema 10/30/2019   Elbow fracture 04/2017   Right, had surgery   Esophageal reflux    Fracture of inferior pubic ramus (HCC) 02/28/2019   Hematochezia    Hip fracture (HCC) 12/12/2018   HLD (hyperlipidemia)    Hx of colonoscopy    Hydronephrosis 07/29/2019   Left foot pain 09/02/2014   Assessment: 89 year old with acquired leg length discrepancy on the left who presents with 10 days of acute onset left foot pain. She seemed to be doing well prior to this pain with her orthotics that had been made to correct her leg length discrepancy. Negative x-rays at outside office which were again reviewed today.   This pain does not seem to impair her function and which has actually been im   Macular pucker, right eye    will have removed on 01/26/21   Osteoporosis    Paroxysmal supraventricular tachycardia (HCC)    Pneumonia    Pyelonephritis 07/29/2019   Sacral insufficiency fracture 12/12/2018   Sarcoidosis    Sepsis (HCC) 07/30/2019   Sepsis due to Escherichia coli (e. coli) (HCC) 07/29/2019   Sepsis due to urinary tract infection (HCC) 07/18/2019    Tobacco History: Social History   Tobacco Use  Smoking Status Never  Smokeless Tobacco Never   Counseling given: Not Answered   Outpatient Medications Prior to Visit  Medication Sig Dispense Refill   acetaminophen (TYLENOL) 500 MG tablet Take 1,000 mg by mouth every 6 (six) hours as needed (for pain).      Budeson-Glycopyrrol-Formoterol (BREZTRI AEROSPHERE) 160-9-4.8 MCG/ACT AERO Inhale 2 puffs into the lungs in the morning and at bedtime. 10.7 g 5   Calcium Carbonate Antacid (TUMS ULTRA 1000 PO) Take 1,000 mg by mouth every 12 (twelve) hours as needed (acid reflux/indigestion/heartburn).     cyanocobalamin (VITAMIN B12) 1000 MCG tablet Take 1,000 mcg by mouth daily.     ELIQUIS 2.5 MG TABS tablet TAKE 1 TABLET(2.5 MG) BY MOUTH TWICE DAILY (Patient taking differently: Take 2.5 mg by mouth in the morning and at bedtime.) 180 tablet 1   ferrous gluconate (FERGON) 324 MG tablet Take 1 tablet (324 mg total) by mouth daily with breakfast.  3   fluticasone (FLONASE) 50 MCG/ACT nasal spray Place 1 spray into both nostrils daily.  furosemide (LASIX) 20 MG tablet Take 1 tablet (20 mg total) by mouth daily.     guaiFENesin (MUCINEX) 600 MG 12 hr tablet Take 2 tablets (1,200 mg total) by mouth 2 (two) times daily. 30 tablet 0   ipratropium-albuterol (DUONEB) 0.5-2.5 (3) MG/3ML SOLN Take 3 mLs by nebulization every 6 (six) hours as needed (wheezing; shortness of breath). 75 mL 5   levofloxacin (LEVAQUIN) 250 MG tablet Take 250 mg by mouth daily.     levothyroxine (SYNTHROID) 50 MCG tablet Take 50 mcg by mouth daily.     loratadine (CLARITIN) 10 MG tablet Take 10 mg by mouth daily.     omeprazole (PRILOSEC) 40 MG capsule Take 40 mg by mouth in the morning and at bedtime.     oxybutynin (DITROPAN) 5 MG tablet Take 5 mg by mouth daily.     polyethylene glycol (MIRALAX / GLYCOLAX) 17 g packet Take 17 g by mouth daily as needed for mild constipation.     sertraline (ZOLOFT) 50 MG tablet Take 1 tablet (50 mg total) by mouth at bedtime. 30 tablet 1   traMADol (ULTRAM) 50 MG tablet Take 50 mg by mouth every 6 (six) hours as needed.     No facility-administered medications prior to visit.     Review of Systems:   Constitutional: No weight loss or gain, night sweats, fevers, chills, or lassitude. + Fatigue  (baseline) HEENT: No headaches, difficulty swallowing, tooth/dental problems, or sore throat. No sneezing, itching, nasal congestion, ear pressure/pain CV:  No chest pain, orthopnea, PND, swelling in lower extremities, anasarca, dizziness, palpitations, syncope Resp: +shortness of breath with exertion (improved). No cough. No wheezing. No hemoptysis.  No chest wall deformity GI:  No heartburn, indigestion GU: No dysuria, change in color of urine, urgency or frequency.  Skin: No rash, lesions, ulcerations MSK:  +chronic knee pain  Neuro: No dizziness or lightheadedness.  Psych: No depression or anxiety. Mood stable.     Physical Exam:  BP 120/78 (BP Location: Right Arm, Patient Position: Sitting, Cuff Size: Normal)   Pulse 68   Ht 5\' 3"  (1.6 m)   Wt 120 lb (54.4 kg)   LMP 02/22/1983 (Approximate)   SpO2 96%   BMI 21.26 kg/m   GEN: Pleasant, interactive, well-kempt; elderly; in no acute distress. HEENT:  Normocephalic and atraumatic. PERRLA. Sclera white. Nasal turbinates pink, moist and patent bilaterally. No rhinorrhea present. Oropharynx pink and moist, without exudate or edema. No lesions, ulcerations, or postnasal drip.  NECK:  Supple w/ fair ROM. No JVD present. Thyroid symmetrical with no goiter or nodules palpated. No lymphadenopathy.   CV: RRR, no m/r/g, no peripheral edema. Pulses intact, +2 bilaterally. No cyanosis, pallor or clubbing. PULMONARY:  Unlabored, regular breathing. Clear bilaterally A&P w/o wheezes/rales/rhonchi. No accessory muscle use. No dullness to percussion. GI: BS present and normoactive. Soft, non-tender to palpation. No organomegaly or masses detected.  MSK: No erythema, warmth or tenderness. Cap refil <2 sec all extrem.  Neuro: A/Ox3. No focal deficits noted.   Skin: Warm, no lesions or rashe Psych: Normal affect and behavior. Judgement and thought content appropriate.     Lab Results:  CBC    Component Value Date/Time   WBC 8.1 07/19/2022 0410    RBC 3.28 (L) 07/19/2022 0410   HGB 9.3 (L) 07/19/2022 0410   HGB 12.7 07/21/2020 1315   HCT 30.0 (L) 07/19/2022 0410   HCT 38.4 07/21/2020 1315   PLT 343 07/19/2022 0410   PLT 309 07/21/2020  1315   MCV 91.5 07/19/2022 0410   MCV 94 07/21/2020 1315   MCH 28.4 07/19/2022 0410   MCHC 31.0 07/19/2022 0410   RDW 15.8 (H) 07/19/2022 0410   RDW 12.7 07/21/2020 1315   LYMPHSABS 2.2 07/19/2022 0410   MONOABS 0.8 07/19/2022 0410   EOSABS 0.2 07/19/2022 0410   BASOSABS 0.1 07/19/2022 0410    BMET    Component Value Date/Time   NA 141 12/30/2022 1641   K 4.0 12/30/2022 1641   CL 102 12/30/2022 1641   CO2 22 12/30/2022 1641   GLUCOSE 88 12/30/2022 1641   GLUCOSE 91 07/19/2022 0410   BUN 23 12/30/2022 1641   CREATININE 0.76 12/30/2022 1641   CREATININE 0.85 05/18/2015 1028   CALCIUM 9.3 12/30/2022 1641   GFRNONAA >60 07/19/2022 0410   GFRAA 78 11/26/2019 1234    BNP    Component Value Date/Time   BNP 322.0 (H) 07/13/2022 1500     Imaging:  No results found.  Administration History     None          Latest Ref Rng & Units 11/27/2013    3:33 PM  PFT Results  FVC-Pre L 1.89   FVC-Predicted Pre % 73   FVC-Post L 2.01   FVC-Predicted Post % 77   Pre FEV1/FVC % % 54   Post FEV1/FCV % % 54   FEV1-Pre L 1.02   FEV1-Predicted Pre % 52   FEV1-Post L 1.09   DLCO uncorrected ml/min/mmHg 9.47   DLCO UNC% % 41   DLCO corrected ml/min/mmHg 9.47   DLCO COR %Predicted % 41   DLVA Predicted % 69   TLC L 4.22   TLC % Predicted % 86   RV % Predicted % 95     No results found for: "NITRICOXIDE"      Assessment & Plan:   No problem-specific Assessment & Plan notes found for this encounter.    I spent 35 minutes of dedicated to the care of this patient on the date of this encounter to include pre-visit review of records, face-to-face time with the patient discussing conditions above, post visit ordering of testing, clinical documentation with the electronic  health record, making appropriate referrals as documented, and communicating necessary findings to members of the patients care team.  Noemi Chapel, NP 03/23/2023  Pt aware and understands NP's role.

## 2023-03-23 NOTE — Assessment & Plan Note (Signed)
See above.  Continue mucociliary clearance therapies as needed.  Monitor for signs and symptoms of infection/increased cough with sputum production.

## 2023-03-23 NOTE — Patient Instructions (Addendum)
Continue Breztri 2 puffs Twice daily. Brush tongue and rinse mouth afterwards Continue Albuterol inhaler 2 puffs or 3 mL neb every 6 hours as needed for shortness of breath or wheezing. Notify if symptoms persist despite rescue inhaler/neb use.  Continue guaifenesin (mucinex) twice a day as needed for cough/ congestion  Continue Saline nasal rinses 1-2 times a day for nasal congestion. Continue Flonase nasal spray 2 sprays each nostril daily until sinus congestion improves   Follow up in 6 months with [name] or Florentina Addison Chaneka Trefz,NP.  If symptoms do not improve or worsen, please contact office for sooner follow up or seek emergency care.

## 2023-03-23 NOTE — Assessment & Plan Note (Addendum)
Progressive decline in mobility over the last 6 months to a year.  She is now at skilled living.  Was not progressing with physical therapy so they stopped her treatment sessions.  She is primarily wheelchair-bound at this point.  Encouraged to work on chair exercises much as possible.  Followed by palliative care. DNR/DNI

## 2023-03-29 ENCOUNTER — Other Ambulatory Visit: Payer: Self-pay

## 2023-03-29 ENCOUNTER — Encounter (HOSPITAL_COMMUNITY): Payer: Self-pay | Admitting: Emergency Medicine

## 2023-03-29 ENCOUNTER — Inpatient Hospital Stay (HOSPITAL_COMMUNITY)
Admission: EM | Admit: 2023-03-29 | Discharge: 2023-04-07 | DRG: 871 | Disposition: A | Discharge status: Skilled Nursing Facility | Payer: Medicare Other | Attending: Family Medicine | Admitting: Family Medicine

## 2023-03-29 DIAGNOSIS — E876 Hypokalemia: Secondary | ICD-10-CM | POA: Diagnosis present

## 2023-03-29 DIAGNOSIS — A4151 Sepsis due to Escherichia coli [E. coli]: Principal | ICD-10-CM | POA: Diagnosis present

## 2023-03-29 DIAGNOSIS — L89611 Pressure ulcer of right heel, stage 1: Secondary | ICD-10-CM | POA: Diagnosis present

## 2023-03-29 DIAGNOSIS — Z8619 Personal history of other infectious and parasitic diseases: Secondary | ICD-10-CM

## 2023-03-29 DIAGNOSIS — Z79890 Hormone replacement therapy (postmenopausal): Secondary | ICD-10-CM

## 2023-03-29 DIAGNOSIS — Z8701 Personal history of pneumonia (recurrent): Secondary | ICD-10-CM

## 2023-03-29 DIAGNOSIS — E785 Hyperlipidemia, unspecified: Secondary | ICD-10-CM | POA: Diagnosis present

## 2023-03-29 DIAGNOSIS — I4719 Other supraventricular tachycardia: Secondary | ICD-10-CM | POA: Diagnosis present

## 2023-03-29 DIAGNOSIS — Z87440 Personal history of urinary (tract) infections: Secondary | ICD-10-CM

## 2023-03-29 DIAGNOSIS — Z881 Allergy status to other antibiotic agents status: Secondary | ICD-10-CM

## 2023-03-29 DIAGNOSIS — E874 Mixed disorder of acid-base balance: Secondary | ICD-10-CM | POA: Diagnosis present

## 2023-03-29 DIAGNOSIS — R0902 Hypoxemia: Secondary | ICD-10-CM | POA: Diagnosis present

## 2023-03-29 DIAGNOSIS — I5033 Acute on chronic diastolic (congestive) heart failure: Secondary | ICD-10-CM | POA: Diagnosis present

## 2023-03-29 DIAGNOSIS — E861 Hypovolemia: Secondary | ICD-10-CM | POA: Diagnosis present

## 2023-03-29 DIAGNOSIS — N200 Calculus of kidney: Secondary | ICD-10-CM | POA: Diagnosis present

## 2023-03-29 DIAGNOSIS — E039 Hypothyroidism, unspecified: Secondary | ICD-10-CM | POA: Diagnosis present

## 2023-03-29 DIAGNOSIS — G9341 Metabolic encephalopathy: Secondary | ICD-10-CM | POA: Diagnosis present

## 2023-03-29 DIAGNOSIS — Z96612 Presence of left artificial shoulder joint: Secondary | ICD-10-CM | POA: Diagnosis present

## 2023-03-29 DIAGNOSIS — E871 Hypo-osmolality and hyponatremia: Secondary | ICD-10-CM | POA: Diagnosis present

## 2023-03-29 DIAGNOSIS — Z7901 Long term (current) use of anticoagulants: Secondary | ICD-10-CM

## 2023-03-29 DIAGNOSIS — I2510 Atherosclerotic heart disease of native coronary artery without angina pectoris: Secondary | ICD-10-CM | POA: Diagnosis present

## 2023-03-29 DIAGNOSIS — Z79899 Other long term (current) drug therapy: Secondary | ICD-10-CM

## 2023-03-29 DIAGNOSIS — M810 Age-related osteoporosis without current pathological fracture: Secondary | ICD-10-CM | POA: Diagnosis present

## 2023-03-29 DIAGNOSIS — N12 Tubulo-interstitial nephritis, not specified as acute or chronic: Principal | ICD-10-CM

## 2023-03-29 DIAGNOSIS — E440 Moderate protein-calorie malnutrition: Secondary | ICD-10-CM | POA: Diagnosis present

## 2023-03-29 DIAGNOSIS — A419 Sepsis, unspecified organism: Secondary | ICD-10-CM | POA: Diagnosis present

## 2023-03-29 DIAGNOSIS — R9431 Abnormal electrocardiogram [ECG] [EKG]: Secondary | ICD-10-CM | POA: Diagnosis present

## 2023-03-29 DIAGNOSIS — Z1152 Encounter for screening for COVID-19: Secondary | ICD-10-CM

## 2023-03-29 DIAGNOSIS — M2170 Unequal limb length (acquired), unspecified site: Secondary | ICD-10-CM | POA: Diagnosis present

## 2023-03-29 DIAGNOSIS — K5900 Constipation, unspecified: Secondary | ICD-10-CM | POA: Diagnosis present

## 2023-03-29 DIAGNOSIS — J440 Chronic obstructive pulmonary disease with acute lower respiratory infection: Secondary | ICD-10-CM | POA: Diagnosis present

## 2023-03-29 DIAGNOSIS — D869 Sarcoidosis, unspecified: Secondary | ICD-10-CM | POA: Diagnosis present

## 2023-03-29 DIAGNOSIS — Z66 Do not resuscitate: Secondary | ICD-10-CM | POA: Diagnosis present

## 2023-03-29 DIAGNOSIS — B370 Candidal stomatitis: Secondary | ICD-10-CM | POA: Diagnosis present

## 2023-03-29 DIAGNOSIS — R627 Adult failure to thrive: Secondary | ICD-10-CM | POA: Diagnosis present

## 2023-03-29 DIAGNOSIS — I480 Paroxysmal atrial fibrillation: Secondary | ICD-10-CM | POA: Diagnosis present

## 2023-03-29 DIAGNOSIS — Z8249 Family history of ischemic heart disease and other diseases of the circulatory system: Secondary | ICD-10-CM

## 2023-03-29 NOTE — ED Triage Notes (Signed)
Pt BIB EMS from [facility].  Family reports increased weakness.  Alert and oriented.  Had labs/urine done that revealed UTI.  VSS.  NOrmally ambulatory but not today.

## 2023-03-30 ENCOUNTER — Emergency Department (HOSPITAL_COMMUNITY): Payer: Medicare Other

## 2023-03-30 ENCOUNTER — Encounter (HOSPITAL_COMMUNITY): Payer: Self-pay | Admitting: Internal Medicine

## 2023-03-30 DIAGNOSIS — A419 Sepsis, unspecified organism: Secondary | ICD-10-CM | POA: Diagnosis present

## 2023-03-30 DIAGNOSIS — E871 Hypo-osmolality and hyponatremia: Secondary | ICD-10-CM | POA: Diagnosis present

## 2023-03-30 DIAGNOSIS — G9341 Metabolic encephalopathy: Secondary | ICD-10-CM | POA: Diagnosis present

## 2023-03-30 DIAGNOSIS — R9431 Abnormal electrocardiogram [ECG] [EKG]: Secondary | ICD-10-CM | POA: Diagnosis present

## 2023-03-30 DIAGNOSIS — I2510 Atherosclerotic heart disease of native coronary artery without angina pectoris: Secondary | ICD-10-CM | POA: Diagnosis present

## 2023-03-30 DIAGNOSIS — I4719 Other supraventricular tachycardia: Secondary | ICD-10-CM | POA: Diagnosis present

## 2023-03-30 DIAGNOSIS — N390 Urinary tract infection, site not specified: Secondary | ICD-10-CM | POA: Diagnosis not present

## 2023-03-30 DIAGNOSIS — Z881 Allergy status to other antibiotic agents status: Secondary | ICD-10-CM | POA: Diagnosis not present

## 2023-03-30 DIAGNOSIS — J440 Chronic obstructive pulmonary disease with acute lower respiratory infection: Secondary | ICD-10-CM | POA: Diagnosis present

## 2023-03-30 DIAGNOSIS — Z79890 Hormone replacement therapy (postmenopausal): Secondary | ICD-10-CM | POA: Diagnosis not present

## 2023-03-30 DIAGNOSIS — Z8249 Family history of ischemic heart disease and other diseases of the circulatory system: Secondary | ICD-10-CM | POA: Diagnosis not present

## 2023-03-30 DIAGNOSIS — Z1152 Encounter for screening for COVID-19: Secondary | ICD-10-CM | POA: Diagnosis not present

## 2023-03-30 DIAGNOSIS — E861 Hypovolemia: Secondary | ICD-10-CM | POA: Diagnosis present

## 2023-03-30 DIAGNOSIS — I480 Paroxysmal atrial fibrillation: Secondary | ICD-10-CM | POA: Diagnosis present

## 2023-03-30 DIAGNOSIS — A4151 Sepsis due to Escherichia coli [E. coli]: Secondary | ICD-10-CM | POA: Diagnosis present

## 2023-03-30 DIAGNOSIS — E039 Hypothyroidism, unspecified: Secondary | ICD-10-CM | POA: Diagnosis present

## 2023-03-30 DIAGNOSIS — Z79899 Other long term (current) drug therapy: Secondary | ICD-10-CM | POA: Diagnosis not present

## 2023-03-30 DIAGNOSIS — D869 Sarcoidosis, unspecified: Secondary | ICD-10-CM | POA: Diagnosis present

## 2023-03-30 DIAGNOSIS — E440 Moderate protein-calorie malnutrition: Secondary | ICD-10-CM | POA: Diagnosis present

## 2023-03-30 DIAGNOSIS — I5033 Acute on chronic diastolic (congestive) heart failure: Secondary | ICD-10-CM | POA: Diagnosis present

## 2023-03-30 DIAGNOSIS — Z7901 Long term (current) use of anticoagulants: Secondary | ICD-10-CM | POA: Diagnosis not present

## 2023-03-30 DIAGNOSIS — N12 Tubulo-interstitial nephritis, not specified as acute or chronic: Secondary | ICD-10-CM | POA: Diagnosis present

## 2023-03-30 DIAGNOSIS — B370 Candidal stomatitis: Secondary | ICD-10-CM | POA: Diagnosis present

## 2023-03-30 DIAGNOSIS — E782 Mixed hyperlipidemia: Secondary | ICD-10-CM | POA: Diagnosis not present

## 2023-03-30 DIAGNOSIS — Z66 Do not resuscitate: Secondary | ICD-10-CM | POA: Diagnosis present

## 2023-03-30 DIAGNOSIS — E876 Hypokalemia: Secondary | ICD-10-CM | POA: Diagnosis present

## 2023-03-30 DIAGNOSIS — R627 Adult failure to thrive: Secondary | ICD-10-CM | POA: Diagnosis present

## 2023-03-30 DIAGNOSIS — E785 Hyperlipidemia, unspecified: Secondary | ICD-10-CM | POA: Diagnosis present

## 2023-03-30 DIAGNOSIS — E874 Mixed disorder of acid-base balance: Secondary | ICD-10-CM | POA: Diagnosis present

## 2023-03-30 LAB — BLOOD CULTURE ID PANEL (REFLEXED) - BCID2

## 2023-03-30 LAB — URINALYSIS, W/ REFLEX TO CULTURE (INFECTION SUSPECTED)
Bilirubin Urine: NEGATIVE
Glucose, UA: NEGATIVE mg/dL
Ketones, ur: NEGATIVE mg/dL
Nitrite: NEGATIVE
Protein, ur: 100 mg/dL — AB
Specific Gravity, Urine: 1.016 (ref 1.005–1.030)
WBC, UA: >50 WBC/hpf (ref 0–5)
pH: 5 (ref 5.0–8.0)

## 2023-03-30 LAB — COMPREHENSIVE METABOLIC PANEL
ALT: 9 U/L (ref 0–44)
AST: 16 U/L (ref 15–41)
Albumin: 3 g/dL — ABNORMAL LOW (ref 3.5–5.0)
Alkaline Phosphatase: 93 U/L (ref 38–126)
Anion gap: 12 (ref 5–15)
BUN: 23 mg/dL (ref 8–23)
CO2: 21 mmol/L — ABNORMAL LOW (ref 22–32)
Calcium: 8.7 mg/dL — ABNORMAL LOW (ref 8.9–10.3)
Chloride: 97 mmol/L — ABNORMAL LOW (ref 98–111)
Creatinine, Ser: 0.79 mg/dL (ref 0.44–1.00)
GFR, Estimated: >60 mL/min (ref >60)
Glucose, Bld: 94 mg/dL (ref 70–99)
Potassium: 3.1 mmol/L — ABNORMAL LOW (ref 3.5–5.1)
Sodium: 130 mmol/L — ABNORMAL LOW (ref 135–145)
Total Bilirubin: 0.9 mg/dL (ref 0.0–1.2)
Total Protein: 6.4 g/dL — ABNORMAL LOW (ref 6.5–8.1)

## 2023-03-30 LAB — CBC WITH DIFFERENTIAL/PLATELET
Abs Immature Granulocytes: 0.32 10*3/uL — ABNORMAL HIGH (ref 0.00–0.07)
Basophils Absolute: 0 10*3/uL (ref 0.0–0.1)
Basophils Relative: 0 %
Eosinophils Absolute: 0 10*3/uL (ref 0.0–0.5)
Eosinophils Relative: 0 %
HCT: 40.3 % (ref 36.0–46.0)
Hemoglobin: 13.8 g/dL (ref 12.0–15.0)
Immature Granulocytes: 1 %
Lymphocytes Relative: 4 %
Lymphs Abs: 0.9 10*3/uL (ref 0.7–4.0)
MCH: 32.6 pg (ref 26.0–34.0)
MCHC: 34.2 g/dL (ref 30.0–36.0)
MCV: 95.3 fL (ref 80.0–100.0)
Monocytes Absolute: 1.4 10*3/uL — ABNORMAL HIGH (ref 0.1–1.0)
Monocytes Relative: 6 %
Neutro Abs: 19.8 10*3/uL — ABNORMAL HIGH (ref 1.7–7.7)
Neutrophils Relative %: 89 %
Platelets: 199 10*3/uL (ref 150–400)
RBC: 4.23 MIL/uL (ref 3.87–5.11)
RDW: 14.6 % (ref 11.5–15.5)
WBC: 22.4 10*3/uL — ABNORMAL HIGH (ref 4.0–10.5)
nRBC: 0 % (ref 0.0–0.2)

## 2023-03-30 LAB — MAGNESIUM: Magnesium: 1.9 mg/dL (ref 1.7–2.4)

## 2023-03-30 LAB — RESP PANEL BY RT-PCR (RSV, FLU A&B, COVID)  RVPGX2
Influenza A by PCR: NEGATIVE
Influenza B by PCR: NEGATIVE
Resp Syncytial Virus by PCR: NEGATIVE
SARS Coronavirus 2 by RT PCR: NEGATIVE

## 2023-03-30 LAB — PROTIME-INR
INR: 1.5 — ABNORMAL HIGH (ref 0.8–1.2)
Prothrombin Time: 18.8 s — ABNORMAL HIGH (ref 11.4–15.2)

## 2023-03-30 LAB — I-STAT CG4 LACTIC ACID, ED: Lactic Acid, Venous: 1.4 mmol/L (ref 0.5–1.9)

## 2023-03-30 LAB — TSH: TSH: 2.427 u[IU]/mL (ref 0.350–4.500)

## 2023-03-30 LAB — APTT: aPTT: 29 s (ref 24–36)

## 2023-03-30 LAB — T4, FREE: Free T4: 1.39 ng/dL — ABNORMAL HIGH (ref 0.61–1.12)

## 2023-03-30 MED ORDER — POLYETHYLENE GLYCOL 3350 17 G PO PACK: 17.0000 g | PACK | Freq: Every day | ORAL | Status: DC | PRN

## 2023-03-30 MED ORDER — CALCIUM CARBONATE ANTACID 1000 MG PO CHEW: 1000.0000 mg | CHEWABLE_TABLET | Freq: Two times a day (BID) | ORAL | Status: DC

## 2023-03-30 MED ORDER — ACETAMINOPHEN 500 MG PO TABS
1000.0000 mg | ORAL_TABLET | Freq: Once | ORAL | Status: AC
  Administered 2023-03-30: 1000 mg via ORAL
  Filled 2023-03-30: qty 2

## 2023-03-30 MED ORDER — BUDESON-GLYCOPYRROL-FORMOTEROL 160-9-4.8 MCG/ACT IN AERO
2.0000 | INHALATION_SPRAY | Freq: Two times a day (BID) | RESPIRATORY_TRACT | Status: DC
  Administered 2023-04-01 – 2023-04-04 (×7): 2 via RESPIRATORY_TRACT

## 2023-03-30 MED ORDER — CALCIUM CARBONATE ANTACID 500 MG PO CHEW
2.0000 | CHEWABLE_TABLET | Freq: Two times a day (BID) | ORAL | Status: DC
  Administered 2023-03-30 – 2023-04-07 (×11): 400 mg via ORAL
  Filled 2023-03-30 (×15): qty 2

## 2023-03-30 MED ORDER — ENOXAPARIN SODIUM 40 MG/0.4ML IJ SOSY: 40.0000 mg | PREFILLED_SYRINGE | INTRAMUSCULAR | Status: DC

## 2023-03-30 MED ORDER — LEVOTHYROXINE SODIUM 100 MCG PO TABS
100.0000 ug | ORAL_TABLET | Freq: Every day | ORAL | Status: DC
  Administered 2023-03-31 – 2023-04-07 (×8): 100 ug via ORAL
  Filled 2023-03-30 (×8): qty 1

## 2023-03-30 MED ORDER — OXYBUTYNIN CHLORIDE 5 MG PO TABS
5.0000 mg | ORAL_TABLET | Freq: Every day | ORAL | Status: DC
  Administered 2023-03-31 – 2023-04-07 (×8): 5 mg via ORAL
  Filled 2023-03-30 (×8): qty 1

## 2023-03-30 MED ORDER — ACETAMINOPHEN 650 MG RE SUPP: 650.0000 mg | Freq: Four times a day (QID) | RECTAL | Status: DC | PRN

## 2023-03-30 MED ORDER — METHENAMINE MANDELATE 0.5 G PO TABS
1000.0000 mg | ORAL_TABLET | Freq: Two times a day (BID) | ORAL | Status: DC
  Administered 2023-03-31 – 2023-04-07 (×12): 1000 mg via ORAL
  Filled 2023-03-30 (×15): qty 2

## 2023-03-30 MED ORDER — IPRATROPIUM-ALBUTEROL 0.5-2.5 (3) MG/3ML IN SOLN
3.0000 mL | Freq: Four times a day (QID) | RESPIRATORY_TRACT | Status: DC | PRN
Start: 2023-03-30 — End: 2023-04-07

## 2023-03-30 MED ORDER — ACETAMINOPHEN 325 MG PO TABS
650.0000 mg | ORAL_TABLET | Freq: Four times a day (QID) | ORAL | Status: DC | PRN
  Administered 2023-04-03: 650 mg via ORAL
  Filled 2023-03-30: qty 2

## 2023-03-30 MED ORDER — PROPYLENE GLYCOL 0.6 % OP SOLN: Freq: Two times a day (BID) | OPHTHALMIC | Status: DC

## 2023-03-30 MED ORDER — MELATONIN 5 MG PO CHEW: 5.0000 mg | CHEWABLE_TABLET | Freq: Every day | ORAL | Status: DC

## 2023-03-30 MED ORDER — APIXABAN 2.5 MG PO TABS
2.5000 mg | ORAL_TABLET | Freq: Two times a day (BID) | ORAL | Status: DC
  Administered 2023-03-30 – 2023-04-07 (×15): 2.5 mg via ORAL
  Filled 2023-03-30 (×16): qty 1

## 2023-03-30 MED ORDER — POLYVINYL ALCOHOL 1.4 % OP SOLN
1.0000 [drp] | Freq: Two times a day (BID) | OPHTHALMIC | Status: DC
  Administered 2023-03-30 – 2023-04-07 (×11): 1 [drp] via OPHTHALMIC
  Filled 2023-03-30: qty 15

## 2023-03-30 MED ORDER — METHENAMINE HIPPURATE 1 G PO TABS: 1.0000 g | ORAL_TABLET | Freq: Every day | ORAL | Status: DC

## 2023-03-30 MED ORDER — GUAIFENESIN ER 600 MG PO TB12
1200.0000 mg | ORAL_TABLET | Freq: Two times a day (BID) | ORAL | Status: DC
  Administered 2023-03-30 – 2023-04-07 (×12): 1200 mg via ORAL
  Filled 2023-03-30 (×16): qty 2

## 2023-03-30 MED ORDER — SODIUM CHLORIDE 0.9 % IV SOLN: 1.0000 g | INTRAVENOUS | Status: DC

## 2023-03-30 MED ORDER — CARMEX CLASSIC LIP BALM EX OINT: TOPICAL_OINTMENT | Freq: Once | CUTANEOUS | Status: AC

## 2023-03-30 MED ORDER — MELATONIN 5 MG PO TABS
5.0000 mg | ORAL_TABLET | Freq: Every day | ORAL | Status: DC
  Administered 2023-03-30 – 2023-04-06 (×6): 5 mg via ORAL
  Filled 2023-03-30 (×8): qty 1

## 2023-03-30 MED ORDER — MAGNESIUM SULFATE IN D5W 1-5 GM/100ML-% IV SOLN
1.0000 g | Freq: Once | INTRAVENOUS | Status: AC
  Administered 2023-03-30: 1 g via INTRAVENOUS
  Filled 2023-03-30: qty 100

## 2023-03-30 MED ORDER — ONDANSETRON HCL 4 MG/2ML IJ SOLN: 4.0000 mg | Freq: Four times a day (QID) | INTRAMUSCULAR | Status: DC | PRN

## 2023-03-30 MED ORDER — PANTOPRAZOLE SODIUM 40 MG PO TBEC
40.0000 mg | DELAYED_RELEASE_TABLET | Freq: Every day | ORAL | Status: DC
  Administered 2023-03-30 – 2023-04-07 (×8): 40 mg via ORAL
  Filled 2023-03-30 (×9): qty 1

## 2023-03-30 MED ORDER — SODIUM CHLORIDE 0.9 % IV SOLN
1.0000 g | Freq: Two times a day (BID) | INTRAVENOUS | Status: AC
  Administered 2023-03-30 – 2023-04-05 (×14): 1 g via INTRAVENOUS
  Filled 2023-03-30 (×14): qty 20

## 2023-03-30 MED ORDER — SERTRALINE HCL 50 MG PO TABS
50.0000 mg | ORAL_TABLET | Freq: Every day | ORAL | Status: DC
  Administered 2023-03-30 – 2023-04-06 (×7): 50 mg via ORAL
  Filled 2023-03-30 (×8): qty 1

## 2023-03-30 MED ORDER — FERROUS SULFATE 325 (65 FE) MG PO TABS
325.0000 mg | ORAL_TABLET | ORAL | Status: DC
  Administered 2023-03-31 – 2023-04-06 (×3): 325 mg via ORAL
  Filled 2023-03-30 (×4): qty 1

## 2023-03-30 MED ORDER — PROCHLORPERAZINE EDISYLATE 10 MG/2ML IJ SOLN
5.0000 mg | INTRAMUSCULAR | Status: DC | PRN
  Administered 2023-03-31 – 2023-04-03 (×3): 5 mg via INTRAVENOUS
  Filled 2023-03-30 (×3): qty 2

## 2023-03-30 MED ORDER — SODIUM CHLORIDE 0.9 % IV BOLUS
1000.0000 mL | Freq: Once | INTRAVENOUS | Status: AC
  Administered 2023-03-30: 1000 mL via INTRAVENOUS

## 2023-03-30 MED ORDER — POTASSIUM CHLORIDE CRYS ER 20 MEQ PO TBCR
20.0000 meq | EXTENDED_RELEASE_TABLET | Freq: Once | ORAL | Status: AC
  Administered 2023-03-30: 20 meq via ORAL
  Filled 2023-03-30: qty 1

## 2023-03-30 MED ORDER — KCL IN DEXTROSE-NACL 20-5-0.9 MEQ/L-%-% IV SOLN
INTRAVENOUS | Status: AC
  Filled 2023-03-30: qty 1000

## 2023-03-30 MED ORDER — ONDANSETRON HCL 4 MG PO TABS: 4.0000 mg | ORAL_TABLET | Freq: Four times a day (QID) | ORAL | Status: DC | PRN

## 2023-03-30 MED ORDER — VITAMIN B-12 1000 MCG PO TABS
1000.0000 ug | ORAL_TABLET | Freq: Every day | ORAL | Status: DC
  Administered 2023-03-31 – 2023-04-01 (×2): 1000 ug via ORAL
  Filled 2023-03-30 (×2): qty 1

## 2023-03-30 MED ORDER — SODIUM CHLORIDE 0.9 % IV SOLN
2.0000 g | Freq: Once | INTRAVENOUS | Status: AC
  Administered 2023-03-30: 2 g via INTRAVENOUS
  Filled 2023-03-30: qty 20

## 2023-03-30 NOTE — ED Provider Notes (Signed)
 [place] [facility]PART[facility] AT [facility] Provider Note   CSN: [id number] Arrival date & time: 03/29/23  1749     History  Chief Complaint  Patient presents with   Weakness    [name] is a 89 y.o. female.  89 yo F with a chief complaints of confusion fatigue.  Going on for little bit over a week.  She has been treated recently for urinary tract infection.  She reportedly gets these frequently.  She unfortunately has not had any improvements and thought to have had some worsening.  Potassium was low today and found to have a leukocytosis sent here for evaluation and likely admission.   Weakness      Home Medications Prior to Admission medications   Medication Sig Start Date End Date Taking? Authorizing Provider  acetaminophen  (TYLENOL ) 500 MG tablet Take 1,000 mg by mouth every 6 (six) hours as needed (for pain).    [provider]  Budeson-Glycopyrrol-Formoterol  (BREZTRI  AEROSPHERE) 160-9-4.8 MCG/ACT AERO Inhale 2 puffs into the lungs in the morning and at bedtime. 09/29/21   Shelah Lamar GORMAN, MD  Calcium  Carbonate Antacid (TUMS ULTRA 1000 PO) Take 1,000 mg by mouth every 12 (twelve) hours as needed (acid reflux/indigestion/heartburn).    [provider]  cyanocobalamin  (VITAMIN B12) 1000 MCG tablet Take 1,000 mcg by mouth daily.    [provider]  ferrous gluconate  (FERGON) 324 MG tablet Take 1 tablet (324 mg total) by mouth daily with breakfast. 07/20/22   Ezenduka, Nkeiruka J, MD  fluticasone  (FLONASE ) 50 MCG/ACT nasal spray Place 1 spray into both nostrils daily. 08/30/22   [provider]  furosemide  (LASIX ) 20 MG tablet Take 1 tablet (20 mg total) by mouth daily. 07/20/22   Ezenduka, Nkeiruka J, MD  guaiFENesin  (MUCINEX ) 600 MG 12 hr tablet Take 2 tablets (1,200 mg total) by mouth 2 (two) times daily. 03/18/22   Cheryle Page, MD  ipratropium-albuterol  (DUONEB) 0.5-2.5 (3) MG/3ML SOLN Take 3 mLs by nebulization every 6  (six) hours as needed (wheezing; shortness of breath). 10/22/21   Cobb, Comer GAILS, NP  levothyroxine  (SYNTHROID ) 50 MCG tablet Take 50 mcg by mouth daily. 11/25/22   [provider]  loratadine  (CLARITIN ) 10 MG tablet Take 10 mg by mouth daily.    [provider]  Melatonin 5 MG CHEW Chew 1 mg by mouth at bedtime.    [provider]  methimazole (TAPAZOLE) 10 MG tablet Take 10 mg by mouth daily. 02/23/23   [provider]  omeprazole (PRILOSEC) 40 MG capsule Take 40 mg by mouth in the morning and at bedtime.    [provider]  oxybutynin  (DITROPAN ) 5 MG tablet Take 5 mg by mouth daily. 11/25/22   [provider]  polyethylene glycol (MIRALAX  / GLYCOLAX ) 17 g packet Take 17 g by mouth daily as needed for mild constipation.    [provider]  sertraline  (ZOLOFT ) 50 MG tablet Take 1 tablet (50 mg total) by mouth at bedtime. 12/23/21   Ines Onetha NOVAK, MD  traMADol  (ULTRAM ) 50 MG tablet Take 50 mg by mouth every 6 (six) hours as needed. 12/20/22   [provider]      Allergies    Levaquin  [levofloxacin ], Oxycodone , Oxycodone -acetaminophen , Sulfonamide derivatives, Sulfa  antibiotics, and Morphine     Review of Systems   Review of Systems  Neurological:  Positive for weakness.    Physical Exam Updated Vital Signs BP (!) 163/76   Pulse (!) 104  Temp (!) 101.4 F (38.6 C) (Oral)   Resp (!) 26   LMP 02/22/1983 (Approximate)   SpO2 90%  Physical Exam Vitals and nursing note reviewed.  Constitutional:      General: She is not in acute distress.    Appearance: She is well-developed. She is not diaphoretic.  HENT:     Head: Normocephalic and atraumatic.  Eyes:     Pupils: Pupils are equal, round, and reactive to light.  Cardiovascular:     Rate and Rhythm: Normal rate and regular rhythm.     Heart sounds: No murmur heard.    No friction rub. No gallop.  Pulmonary:     Effort: Pulmonary effort is normal.     Breath  sounds: No wheezing or rales.  Abdominal:     General: There is no distension.     Palpations: Abdomen is soft.     Tenderness: There is no abdominal tenderness.  Musculoskeletal:        General: No tenderness.     Cervical back: Normal range of motion and neck supple.  Skin:    General: Skin is warm and dry.  Neurological:     Mental Status: She is alert and oriented to person, place, and time.  Psychiatric:        Behavior: Behavior normal.     ED Results / Procedures / Treatments   Labs (all labs ordered are listed, but only abnormal results are displayed) Labs Reviewed  COMPREHENSIVE METABOLIC PANEL - Abnormal; Notable for the following components:      Result Value   Sodium 130 (*)    Potassium 3.1 (*)    Chloride 97 (*)    CO2 21 (*)    Calcium  8.7 (*)    Total Protein 6.4 (*)    Albumin 3.0 (*)    All other components within normal limits  CBC WITH DIFFERENTIAL/PLATELET - Abnormal; Notable for the following components:   WBC 22.4 (*)    Neutro Abs 19.8 (*)    Monocytes Absolute 1.4 (*)    Abs Immature Granulocytes 0.32 (*)    All other components within normal limits  PROTIME-INR - Abnormal; Notable for the following components:   Prothrombin Time 18.8 (*)    INR 1.5 (*)    All other components within normal limits  URINALYSIS, W/ REFLEX TO CULTURE (INFECTION SUSPECTED) - Abnormal; Notable for the following components:   Color, Urine AMBER (*)    APPearance CLOUDY (*)    Hgb urine dipstick SMALL (*)    Protein, ur 100 (*)    Leukocytes,Ua LARGE (*)    Bacteria, UA MANY (*)    Non Squamous Epithelial 0-5 (*)    All other components within normal limits  RESP PANEL BY RT-PCR (RSV, FLU A&B, COVID)  RVPGX2  CULTURE, BLOOD (ROUTINE X 2)  CULTURE, BLOOD (ROUTINE X 2)  URINE CULTURE  APTT  MAGNESIUM   TSH  T4, FREE  I-STAT CG4 LACTIC ACID, ED  I-STAT CG4 LACTIC ACID, ED    EKG EKG Interpretation Date/Time:  Thursday March 30 2023 01:11:43  EST Ventricular Rate:  73 PR Interval:  158 QRS Duration:  107 QT Interval:  451 QTC Calculation: 497 R Axis:   -31  Text Interpretation: Sinus rhythm Atrial premature complex LVH with secondary repolarization abnormality Borderline prolonged QT interval No significant change since last tracing Confirmed by Emil Share [phone]) on 03/30/2023 2:32:10 AM  Radiology DG Chest Port 1 View Result Date: 03/30/2023 CLINICAL  DATA:  Question of sepsis to evaluate for abnormality. Weakness and urinary tract infection. EXAM: PORTABLE CHEST 1 VIEW COMPARISON:  11/09/2022 FINDINGS: Shallow inspiration. Mild cardiac enlargement. No vascular congestion, edema, or consolidation. Central interstitial changes in the lungs may represent chronic bronchitis. No pleural effusion or pneumothorax. Mediastinal contours appear intact. Calcification of the aorta. Degenerative changes in the spine and right shoulder. Postoperative change in the left shoulder. IMPRESSION: Emphysematous changes in the lungs. Chronic bronchitic changes. Mild cardiac enlargement. No focal consolidation. Electronically Signed   By: Elsie Gravely M.D.   On: 03/30/2023 01:19    Procedures Procedures    Medications Ordered in ED Medications  acetaminophen  (TYLENOL ) tablet 1,000 mg (has no administration in time range)  sodium chloride  0.9 % bolus 1,000 mL (1,000 mLs Intravenous New Bag/Given 03/30/23 0149)  cefTRIAXone  (ROCEPHIN ) 2 g in sodium chloride  0.9 % 100 mL IVPB (0 g Intravenous Stopped 03/30/23 0228)  lip balm (CARMEX) ointment ( Topical Given 03/30/23 0228)    ED Course/ Medical Decision Making/ A&P                                 Medical Decision Making Amount and/or Complexity of Data Reviewed Labs: ordered. Radiology: ordered.  Risk OTC drugs.   89 yO F with a chief complaints of worsening fatigue and lethargy thought to be consistent with a urinary tract infection not improving on oral antibiotics.  Reportedly has  significant bacterial resistance to different pathogens.  Sees urology for this.  Also reportedly had a potassium of 2.5.  Most all the history is obtained from the daughter.  Will recheck blood work here.  Bolus of IV fluids as she appears dehydrated.  Dose of Rocephin .  UA is concerning for UTI.  Mild hypokalemia.  Lactate nml.  Leukocytosis.  CXR independently interpreted by me without focal infiltrate or ptx.  Covid flu rsv negative.  The patients results and plan were reviewed and discussed.   Any x-rays performed were independently reviewed by myself.   Differential diagnosis were considered with the presenting HPI.  Medications  acetaminophen  (TYLENOL ) tablet 1,000 mg (has no administration in time range)  sodium chloride  0.9 % bolus 1,000 mL (1,000 mLs Intravenous New Bag/Given 03/30/23 0149)  cefTRIAXone  (ROCEPHIN ) 2 g in sodium chloride  0.9 % 100 mL IVPB (0 g Intravenous Stopped 03/30/23 0228)  lip balm (CARMEX) ointment ( Topical Given 03/30/23 0228)    Vitals:   03/30/23 0300 03/30/23 0330 03/30/23 0350 03/30/23 0400  BP: 137/68 (!) 155/61  (!) 163/76  Pulse: 81 84 95 (!) 104  Resp: 17 13 20  (!) 26  Temp:   (!) 101.4 F (38.6 C)   TempSrc:   Oral   SpO2: 95% 95% 92% 90%    Final diagnoses:  Pyelonephritis    Admission/ observation were discussed with the admitting physician, patient and/or family and they are comfortable with the plan.          Final Clinical Impression(s) / ED Diagnoses Final diagnoses:  Pyelonephritis    Rx / DC Orders ED Discharge Orders     None         Emil Share, DO 03/30/23 [phone]

## 2023-03-30 NOTE — Progress Notes (Signed)
 UC&S from July, 2024 from [facility].  Culture, Cath Urine - N  Notes: Performed By: [facility], [address] [place], [place], [place], [address]; Lab Dir: Zachary Ores, [phone]  >100,000 C/ML VONNE NEGATIVE RODS; ID AND SENSITIVITY TO FOLLOW  ROrganism 1 - N  Notes: Performed By: [facility], [address] [place], [place], [place], [address]; Lab Dir: Zachary Ores, [phone]  Antimicrobial Susceptibility and Organism Identification Report  FINAL   Source : URINE CULTURE  Iso/Result : 01 >100,000 C/ML Escherichia coli  Antimicrobic/Dose MIC SYSTEMIC URINE  ---------------------------------------------------------------------------------------------------  Amp/Sulbactam 16/8 I I  Amikacin <16 S S  Ampicillin  >16 R R  Amox/K Clav 16/8 I I  Aztreonam >16 R R  Ceftriaxone  >32 R R  Ceftazidime >16 R R  Cefotaxime >32 R R  Cefazolin  >16 R R  Ciprofloxacin  >2 R R  Cefepime  >16 R R  Cefotetan <16 S S  Ertapenem  <0.5 S S  Nitrofurantoin <32 S  Gentamicin <4 S S  Levofloxacin  >4 R R  Meropenem  <1 S S  Pip/Tazo <16 S S  Trimeth /Sulfa  <2/38 S S  Tetracycline >8 R R  Tobramycin >8 R R -------------------------------------------------------------------------------------------------------------- Alm Castor, MD

## 2023-03-30 NOTE — ED Notes (Signed)
 No changes. Family arrives to [facility].

## 2023-03-30 NOTE — ED Notes (Signed)
 Up to b/r by [facility] with 2 NT assist

## 2023-03-30 NOTE — Progress Notes (Signed)
 PHARMACY - PHYSICIAN COMMUNICATION CRITICAL VALUE ALERT - BLOOD CULTURE IDENTIFICATION (BCID)  [name] is an 89 y.o. female with hx ESBL Ecoli UTI who presented to [facility] on 03/29/2023 with a chief complaint of AMS and fatigue. She was started on meropenem  on admission for suspected UTI.  Two of four blood culture bottles collected on 03/30/23 have GNR (BCID = ESBL Ecoli).    Name of physician (or Provider) Contacted: Dr. Celinda   Current antibiotics: meropenem    Changes to prescribed antibiotics recommended:  - continue meropenem    Results for orders placed or performed during the hospital encounter of 03/29/23  Blood Culture ID Panel (Reflexed) (Collected: 03/30/2023  3:26 AM)  Result Value Ref Range   Enterococcus faecalis NOT DETECTED NOT DETECTED   Enterococcus Faecium NOT DETECTED NOT DETECTED   Listeria monocytogenes NOT DETECTED NOT DETECTED   Staphylococcus species NOT DETECTED NOT DETECTED   Staphylococcus aureus (BCID) NOT DETECTED NOT DETECTED   Staphylococcus epidermidis NOT DETECTED NOT DETECTED   Staphylococcus lugdunensis NOT DETECTED NOT DETECTED   Streptococcus species NOT DETECTED NOT DETECTED   Streptococcus agalactiae NOT DETECTED NOT DETECTED   Streptococcus pneumoniae NOT DETECTED NOT DETECTED   Streptococcus pyogenes NOT DETECTED NOT DETECTED   A.calcoaceticus-baumannii NOT DETECTED NOT DETECTED   Bacteroides fragilis NOT DETECTED NOT DETECTED   Enterobacterales DETECTED (A) NOT DETECTED   Enterobacter cloacae complex NOT DETECTED NOT DETECTED   Escherichia coli DETECTED (A) NOT DETECTED   Klebsiella aerogenes NOT DETECTED NOT DETECTED   Klebsiella oxytoca NOT DETECTED NOT DETECTED   Klebsiella pneumoniae NOT DETECTED NOT DETECTED   Proteus species NOT DETECTED NOT DETECTED   Salmonella species NOT DETECTED NOT DETECTED   Serratia marcescens NOT DETECTED NOT DETECTED   Haemophilus influenzae NOT DETECTED NOT DETECTED   Neisseria meningitidis NOT  DETECTED NOT DETECTED   Pseudomonas aeruginosa NOT DETECTED NOT DETECTED   Stenotrophomonas maltophilia NOT DETECTED NOT DETECTED   Candida albicans NOT DETECTED NOT DETECTED   Candida auris NOT DETECTED NOT DETECTED   Candida glabrata NOT DETECTED NOT DETECTED   Candida krusei NOT DETECTED NOT DETECTED   Candida parapsilosis NOT DETECTED NOT DETECTED   Candida tropicalis NOT DETECTED NOT DETECTED   Cryptococcus neoformans/gattii NOT DETECTED NOT DETECTED   CTX-M ESBL DETECTED (A) NOT DETECTED   Carbapenem resistance IMP NOT DETECTED NOT DETECTED   Carbapenem resistance KPC NOT DETECTED NOT DETECTED   Carbapenem resistance NDM NOT DETECTED NOT DETECTED   Carbapenem resist OXA 48 LIKE NOT DETECTED NOT DETECTED   Carbapenem resistance VIM NOT DETECTED NOT DETECTED    Osie, Nyeli Holtmeyer P 03/30/2023  6:22 PM

## 2023-03-30 NOTE — Progress Notes (Signed)
 PHARMACY NOTE -  Meropenem   Pharmacy has been assisting with dosing of meropenem  for UTI with prior ESBL E coli. Dosage remains stable at 1g IV q12 hr and further renal adjustments per institutional Pharmacy antibiotic protocol  Pharmacy will sign off, following peripherally for culture results, dose adjustments, and length of therapy. Please reconsult if a change in clinical status warrants re-evaluation of dosage.  Bard Jeans, PharmD, BCPS [phone] 03/30/2023, 8:58 AM

## 2023-03-30 NOTE — ED Notes (Signed)
 Sleeping/ resting, NAD, calm, daughter at [facility], updated.

## 2023-03-30 NOTE — H&P (Signed)
 History and Physical    Patient: [name] FMW:[id number] DOB: [birth year] DOA: 03/29/2023 DOS: the patient was seen and examined on 03/30/2023 PCP: Caleen Dirks, MD  Patient coming from: Home  Chief Complaint:  Chief Complaint  Patient presents with   Weakness   HPI: [name] is a 89 y.o. female with medical history significant of maxillary sinusitis, posthemorrhagic anemia, AKI, seasonal allergies, aortic atherosclerosis, osteoarthritis, asthma, right Bakers cysts of the knee, CAD, carotid stenosis, right elbow fracture, left hip fracture, inferior pubic ramus fracture, GERD, hyperlipidemia, hydronephrosis, right eye macular degeneration, paroxysmal ventricular tachycardia, pneumonia, sarcoidosis, pyelonephritis, history of frequent UTIs, sepsis due to E. coli who recently was being treated for urinary tract infection, sent from her facility due to generalized weakness according to the patient's daughter.  She is able to answer simple questions.  No headache, chest, back, abdominal or flank pain at the moment.  History is mostly provided by the patient's daughter.  Her daughter stated that she has a history of a very resistant bacteria when she was seen by Dr. Elisabeth at [facility].  Lab work: Urinalysis was hospital doctor with a cloudy appearance, small hemoglobin and large leukocyte esterase.  Protein 100 mg/deciliter.  Greater than 50 WBC, many bacteria and positive WBC clumps.  CBC showed a white count 22.4, hemoglobin 13.8 g/dL and platelets 800.  PT 18.8, INR 1.5 and PTT 29.  Coronavirus, influenza and RSV PCR were negative.  Lactic acid was normal.  Magnesium  1.9 mg/dL.  CMP showed a sodium 130, potassium 3.1, chloride 97 and CO2 21 mmol/L with a normal anion gap.  Normal glucose and renal function.  Calcium  is normal after correction.  Total protein was 6.4 and albumin 3.0 g/dL, the rest of the LFTs were normal.  Imaging: Portable 1 view chest radiograph showing emphysematous changes in  the lungs.  Chronic bronchitic changes.  Mild cardiac enlargement.  No focal consolidation.   ED course: Initial vital signs were temperature 99.2 F, pulse 83, respirations 16, BP 125 to 66 mmHg O2 sat 92% on room air.  The patient received acetaminophen  1000 mg p.o. x 1 dose, ceftriaxone  2 g IVPB and 1000 mL of normal saline bolus.  Review of Systems: As mentioned in the history of present illness. All other systems reviewed and are negative. Past Medical History:  Diagnosis Date   Acute maxillary sinusitis 10/17/2017   Acute posthemorrhagic anemia 08/22/2012   Acute renal failure syndrome (HCC) 01/04/2019   Allergy    SEASONAL   Anemia    Aortic atherosclerosis (HCC)    Arthritis    Asthma    slight    Baker's cyst of knee, right    CAD (coronary artery disease)    Carotid stenosis    Closed fracture of right olecranon process 05/09/2017   Closed left subtrochanteric femur fracture S/P Open/closed and reduction, internal medullary fixation  09/05/2012   Dyspnea    due to Sarcoidosis. When is windy or humed   Edema 10/30/2019   Elbow fracture 04/2017   Right, had surgery   Esophageal reflux    Fracture of inferior pubic ramus (HCC) 02/28/2019   Hematochezia    Hip fracture (HCC) 12/12/2018   HLD (hyperlipidemia)    Hx of colonoscopy    Hydronephrosis 07/29/2019   Left foot pain 09/02/2014   Assessment: 89 year old with acquired leg length discrepancy on the left who presents with 10 days of acute onset left foot pain. She seemed to be doing well  prior to this pain with her orthotics that had been made to correct her leg length discrepancy. Negative x-rays at outside office which were again reviewed today.   This pain does not seem to impair her function and which has actually been im   Macular pucker, right eye    will have removed on 01/26/21   Osteoporosis    Paroxysmal supraventricular tachycardia (HCC)    Pneumonia    Pyelonephritis 07/29/2019   Sacral insufficiency  fracture 12/12/2018   Sarcoidosis    Sepsis (HCC) 07/30/2019   Sepsis due to Escherichia coli (e. 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   COLONOSCOPY     CYSTOSCOPY W/ RETROGRADES Left 07/30/2019   Procedure: CYSTOSCOPY WITH RETROGRADE PYELOGRAM LEFT STENT PLACEMENT;  Surgeon: Cam Morene ORN, MD;  Location: [facility] ORS;  Service: Urology;  Laterality: Left;   CYSTOSCOPY WITH RETROGRADE PYELOGRAM, URETEROSCOPY AND STENT PLACEMENT Left 08/20/2019   Procedure: CYSTOSCOPY, URETEROSCOPY AND STENT PLACEMENT;  Surgeon: Elisabeth Valli BIRCH, MD;  Location: [facility];  Service: Urology;  Laterality: Left;  1 HR   FOOT SURGERY Right    HIP SURGERY Right 2011   full replacement   HOLMIUM LASER APPLICATION Left 08/20/2019   Procedure: HOLMIUM LASER APPLICATION;  Surgeon: Elisabeth Valli BIRCH, MD;  Location: [facility];  Service: Urology;  Laterality: Left;   LEFT HEART CATH AND CORONARY ANGIOGRAPHY N/A 08/09/2021   Procedure: LEFT HEART CATH AND CORONARY ANGIOGRAPHY;  Surgeon: Fell Ozell, MD;  Location: [facility] INVASIVE CV LAB;  Service: Cardiovascular;  Laterality: N/A;   LEG SURGERY Left 06/2012   femur fracture s/p open and closed reduction in [place] , Dr. Trudy   lens replacement Right    right eye   ORIF ELBOW FRACTURE Right 05/09/2017   Procedure: ORIF right olecranon fracture with repair/reconstruction, ulnar nerve transposition as needed;  Surgeon: Camella Fallow, MD;  Location: [facility] OR;  Service: Orthopedics;  Laterality: Right;  Requests 90 mins   pelvis fracture      POLYPECTOMY     REVERSE SHOULDER ARTHROPLASTY Left 06/13/2019   Procedure: REVERSE SHOULDER ARTHROPLASTY;  Surgeon: Melita Drivers, MD;  Location: [facility];  Service: Orthopedics;  Laterality: Left;    SHOULDER ARTHROSCOPY W/ ROTATOR CUFF REPAIR Right    TRAPEZIUM RESECTION Right    Social History:  reports that she has never smoked. She has never used smokeless tobacco. She reports that she does not drink alcohol  and does not use drugs.  Allergies  Allergen Reactions   Levaquin  [Levofloxacin ] Other (See Comments)    Joint pain.. Per doctor, told to not to take again and allergic, per [facility]   Oxycodone  Nausea And Vomiting and Other (See Comments)    Can take oxycodone  with Zofran  (otherwise, GI Intolerance)   Oxycodone -Acetaminophen  Other (See Comments)    GI Intolerance and talking out of my head and allergic, per [employer]   Sulfonamide Derivatives Other (See Comments)    Possibly caused hepatitis and allergic, per [facility]   Sulfa  Antibiotics Other (See Comments)    Possibly caused hepatitis  and allergic, per [facility]   Morphine  Nausea Only and Other (See Comments)    GI intolerance and allergic, per [facility]    Family History  Problem Relation Age of Onset   Colon cancer Mother 24   Cancer Mother    Lung cancer Father    Heart disease Father    Arrhythmia Father    Cancer Father    Cancer Sister        ovarian   Heart attack Brother    Esophageal cancer Neg Hx    Rectal cancer Neg Hx    Stomach cancer Neg Hx    Stroke Neg Hx     Prior to Admission medications   Medication Sig Start Date End Date Taking? Authorizing Provider  acetaminophen  (TYLENOL ) 325 MG tablet Take 650 mg by mouth stat. For fever   Yes [provider]  acetaminophen  (TYLENOL ) 500 MG tablet Take 1,000 mg by mouth every 6 (six) hours as needed (for pain).   Yes [provider]  apixaban  (ELIQUIS ) 2.5 MG TABS tablet Take 2.5 mg by mouth 2 (two) times daily.   Yes [provider]   ascorbic acid  (VITAMIN C ) 500 MG tablet Take 500 mg by mouth daily.   Yes [provider]  Budeson-Glycopyrrol-Formoterol  (BREZTRI  AEROSPHERE) 160-9-4.8 MCG/ACT AERO Inhale 2 puffs into the lungs in the morning and at bedtime. 09/29/21  Yes Shelah Lamar RAMAN, MD  Calcium  Carbonate Antacid (TUMS ULTRA 1000 PO) Take 1,000 mg by mouth 2 (two) times daily.   Yes [provider]  cyanocobalamin  (VITAMIN B12) 1000 MCG tablet Take 1,000 mcg by mouth daily.   Yes [provider]  ferrous sulfate  325 (65 FE) MG tablet Take 325 mg by mouth every other day. Take with orange juice when possible   Yes [provider]  furosemide  (LASIX ) 20 MG tablet Take 1 tablet (20 mg total) by mouth daily. 07/20/22  Yes Ezenduka, Nkeiruka J, MD  guaiFENesin  (MUCINEX ) 600 MG 12 hr tablet Take 2 tablets (1,200 mg total) by mouth 2 (two) times daily. 03/18/22  Yes Cheryle Page, MD  ipratropium-albuterol  (DUONEB) 0.5-2.5 (3) MG/3ML SOLN Take 3 mLs by nebulization every 6 (six) hours as needed (wheezing; shortness of breath). 10/22/21  Yes Cobb, Comer GAILS, NP  levothyroxine  (SYNTHROID ) 50 MCG tablet Take 100 mcg by mouth daily. 11/25/22  Yes [provider]  Melatonin 5 MG CHEW Chew 5 mg by mouth at bedtime.   Yes [provider]  Menthol , Topical Analgesic, 4 % GEL Apply 1 Dose/kg topically every 6 (six) hours as needed (arthritic pain control).   Yes [provider]  methenamine  (HIPREX ) 1 g tablet Take 1 g by mouth daily. 02/23/23  Yes [provider]  omeprazole (PRILOSEC) 40 MG capsule Take 40 mg by mouth in the morning and at bedtime.   Yes [provider]  ondansetron  (ZOFRAN ) 4 MG tablet Take 4 mg by mouth every 6 (six) hours as needed for nausea or vomiting.   Yes [provider]  oxybutynin  (DITROPAN ) 5 MG tablet Take 5 mg by mouth daily. 11/25/22  Yes [provider]  polyethylene glycol (MIRALAX  / GLYCOLAX ) 17 g packet Take 17 g  by mouth daily as needed for mild constipation.   Yes [provider]  PROPYLENE GLYCOL OP Place 2 drops into both eyes 2 (two) times daily.   Yes [provider]  sertraline  (ZOLOFT ) 50 MG tablet Take 1 tablet (50 mg total) by mouth at  bedtime. 12/23/21  Yes Ines Onetha NOVAK, MD  sodium chloride  (OCEAN) 0.65 % SOLN nasal spray Place 1 spray into both nostrils 2 (two) times daily.   Yes [provider]  traMADol  (ULTRAM ) 50 MG tablet Take 50 mg by mouth every 6 (six) hours. 12/20/22  Yes [provider]    Physical Exam: Vitals:   03/30/23 0700 03/30/23 0715 03/30/23 0730 03/30/23 0745  BP: (!) 101/49  (!) 100/49   Pulse: 95 94 91 91  Resp: 19 17 (!) 21 (!) 24  Temp:      TempSrc:      SpO2: 91% 92% 90% 92%   Physical Exam Vitals and nursing note reviewed.  Constitutional:      General: She is awake. She is not in acute distress.    Appearance: Normal appearance.  HENT:     Head: Normocephalic.     Mouth/Throat:     Mouth: Mucous membranes are moist.  Eyes:     General: No scleral icterus.    Pupils: Pupils are equal, round, and reactive to light.  Cardiovascular:     Rate and Rhythm: Normal rate and regular rhythm.  Pulmonary:     Breath sounds: Wheezing present.  Abdominal:     General: Bowel sounds are normal. There is no distension.     Palpations: Abdomen is soft.     Tenderness: There is no abdominal tenderness.  Musculoskeletal:     Cervical back: Neck supple.     Right lower leg: No edema.     Left lower leg: No edema.  Skin:    General: Skin is warm and dry.  Neurological:     General: No focal deficit present.     Mental Status: She is alert and oriented to person, place, and time.  Psychiatric:        Mood and Affect: Mood normal.        Behavior: Behavior normal. Behavior is cooperative.     Data Reviewed:  Results are pending, will review when available. EKG: Vent. rate 73 BPM PR interval 158 ms QRS duration 107  ms QT/QTcB 451/497 ms P-R-T axes 51 -31 91 Sinus rhythm Atrial premature complex LVH with secondary repolarization abnormality Borderline prolonged QT interval  Assessment and Plan: Principal Problem:   Sepsis secondary to UTI (HCC) Admit to telemetry/inpatient. Continue IV fluids. Meropenem  per pharmacy. Follow-up urine culture and sensitivity. Follow-up blood culture and sensitivity Follow CBC and CMP in a.m.  Active Problems:   Hypokalemia Correcting. Follow-up potassium level.    Prolonged QT interval Avoid QT prolonging meds as possible. KCl supplementation. Magnesium  sulfate 2 g IVPB now. Keep electrolytes optimized. Check EKG in the morning.    Hyponatremia Likely due to recent poor oral intake. Continue normal saline infusion. Follow-up sodium level in AM.    HLD (hyperlipidemia) Also has aortic and coronary atherosclerosis. Follow-up with primary care provider as an outpatient.    Malnutrition of moderate degree In the setting of anemia and suboptimal nutrition. May benefit from protein supplementation. Consider nutritional services evaluation. Follow-up albumin level.    Advance Care Planning:   Code Status: Do not attempt resuscitation (DNR) PRE-ARREST INTERVENTIONS DESIRED   Consults:   Family Communication: Her daughter was at bedside.  Severity of Illness: The appropriate patient status for this patient is INPATIENT. Inpatient status is judged to be reasonable and necessary in order to provide the required intensity of service to ensure the patient's safety. The patient's presenting symptoms, physical exam  findings, and initial radiographic and laboratory data in the context of their chronic comorbidities is felt to place them at high risk for further clinical deterioration. Furthermore, it is not anticipated that the patient will be medically stable for discharge from the hospital within 2 midnights of admission.   * I certify that at the point of  admission it is my clinical judgment that the patient will require inpatient hospital care spanning beyond 2 midnights from the point of admission due to high intensity of service, high risk for further deterioration and high frequency of surveillance required.*  Author: Alm Dorn Castor, MD 03/30/2023 7:55 AM  For on call review [email or web address].   This document was prepared using Dragon voice recognition software and may contain some unintended transcription errors.

## 2023-03-31 ENCOUNTER — Inpatient Hospital Stay (HOSPITAL_COMMUNITY): Payer: Medicare Other

## 2023-03-31 DIAGNOSIS — N390 Urinary tract infection, site not specified: Secondary | ICD-10-CM | POA: Diagnosis not present

## 2023-03-31 DIAGNOSIS — A419 Sepsis, unspecified organism: Secondary | ICD-10-CM | POA: Diagnosis not present

## 2023-03-31 LAB — CBC WITH DIFFERENTIAL/PLATELET
Abs Immature Granulocytes: 0.47 10*3/uL — ABNORMAL HIGH (ref 0.00–0.07)
Basophils Absolute: 0.1 10*3/uL (ref 0.0–0.1)
Basophils Relative: 0 %
Eosinophils Absolute: 0 10*3/uL (ref 0.0–0.5)
Eosinophils Relative: 0 %
HCT: 35.7 % — ABNORMAL LOW (ref 36.0–46.0)
Hemoglobin: 11.7 g/dL — ABNORMAL LOW (ref 12.0–15.0)
Immature Granulocytes: 2 %
Lymphocytes Relative: 4 %
Lymphs Abs: 0.9 10*3/uL (ref 0.7–4.0)
MCH: 32.1 pg (ref 26.0–34.0)
MCHC: 32.8 g/dL (ref 30.0–36.0)
MCV: 98.1 fL (ref 80.0–100.0)
Monocytes Absolute: 1.3 10*3/uL — ABNORMAL HIGH (ref 0.1–1.0)
Monocytes Relative: 6 %
Neutro Abs: 18.3 10*3/uL — ABNORMAL HIGH (ref 1.7–7.7)
Neutrophils Relative %: 88 %
Platelets: 171 10*3/uL (ref 150–400)
RBC: 3.64 MIL/uL — ABNORMAL LOW (ref 3.87–5.11)
RDW: 14.8 % (ref 11.5–15.5)
WBC: 21.1 10*3/uL — ABNORMAL HIGH (ref 4.0–10.5)
nRBC: 0 % (ref 0.0–0.2)

## 2023-03-31 LAB — COMPREHENSIVE METABOLIC PANEL
ALT: 12 U/L (ref 0–44)
AST: 25 U/L (ref 15–41)
Albumin: 2.2 g/dL — ABNORMAL LOW (ref 3.5–5.0)
Alkaline Phosphatase: 87 U/L (ref 38–126)
Anion gap: 8 (ref 5–15)
BUN: 18 mg/dL (ref 8–23)
CO2: 20 mmol/L — ABNORMAL LOW (ref 22–32)
Calcium: 7.9 mg/dL — ABNORMAL LOW (ref 8.9–10.3)
Chloride: 108 mmol/L (ref 98–111)
Creatinine, Ser: 0.61 mg/dL (ref 0.44–1.00)
GFR, Estimated: >60 mL/min (ref >60)
Glucose, Bld: 95 mg/dL (ref 70–99)
Potassium: 3.1 mmol/L — ABNORMAL LOW (ref 3.5–5.1)
Sodium: 136 mmol/L (ref 135–145)
Total Bilirubin: 0.7 mg/dL (ref 0.0–1.2)
Total Protein: 5 g/dL — ABNORMAL LOW (ref 6.5–8.1)

## 2023-03-31 MED ORDER — ENSURE ENLIVE PO LIQD
237.0000 mL | Freq: Two times a day (BID) | ORAL | Status: DC
  Administered 2023-03-31 – 2023-04-03 (×6): 237 mL via ORAL

## 2023-03-31 MED ORDER — SODIUM CHLORIDE 0.9 % IV SOLN: INTRAVENOUS | Status: DC

## 2023-03-31 MED ORDER — POTASSIUM CHLORIDE CRYS ER 20 MEQ PO TBCR
40.0000 meq | EXTENDED_RELEASE_TABLET | Freq: Once | ORAL | Status: AC
  Administered 2023-03-31: 40 meq via ORAL
  Filled 2023-03-31: qty 2

## 2023-03-31 NOTE — TOC Initial Note (Signed)
 Transition of Care [facility]) - Initial/Assessment Note    Patient Details  Name: [name] MRN: [id number] Date of Birth: Jan 28, 1935  Transition of Care [facility]) CM/SW Contact:    Hoy DELENA Bigness, LCSW Phone Number: 03/31/2023, 11:12 AM  Clinical Narrative:                 Pt from [facility] where she is a LTC resident. Confirmed with family and facility plan for pt to return at discharge. Pt will need PTAR for transportation at discharge. TOC will continue to follow.   Expected Discharge Plan: Skilled Nursing Facility Barriers to Discharge: Continued Medical Work up   Patient Goals and CMS Choice Patient states their goals for this hospitalization and ongoing recovery are:: For [facility] to [facility] to [facility].gov Compare Post Acute Care list provided to:: Patient Represenative (must comment) Choice offered to / list presented to : Adult Children [place] ownership interest in [facility].provided to::  (NA)    Expected Discharge Plan and Services In-house Referral: Clinical Social Work Discharge Planning Services: NA Post Acute Care Choice: Resumption of Svcs/PTA Provider, Nursing Home, Skilled Nursing Facility Living arrangements for the past 2 months: Skilled Nursing Facility                 DME Arranged: N/A DME Agency: NA                  Prior Living Arrangements/Services Living arrangements for the past 2 months: Skilled Nursing Facility Lives with:: Facility Resident Patient language and need for interpreter reviewed:: Yes Do you feel safe going back to the place where you live?: Yes      Need for Family Participation in Patient Care: Yes (Comment) Care giver support system in place?: Yes (comment) Current home services: DME (Cane, walker) Criminal Activity/Legal Involvement Pertinent to Current Situation/Hospitalization: No - Comment as needed  Activities of Daily Living   ADL Screening (condition at time of admission) Independently  performs ADLs?: Yes (appropriate for developmental age) Is the patient deaf or have difficulty hearing?: No Does the patient have difficulty seeing, even when wearing glasses/contacts?: No Does the patient have difficulty concentrating, remembering, or making decisions?: No  Permission Sought/Granted Permission sought to share information with : Facility Medical Sales Representative, Family Supports Permission granted to share information with : Yes, Verbal Permission Granted  Share Information with NAME: Claris Drones  Permission granted to share info w AGENCY: [employer]  Permission granted to share info w Relationship: Daugher  Permission granted to share info w Contact Information: [phone]  Emotional Assessment   Attitude/Demeanor/Rapport: Unable to Assess Affect (typically observed): Unable to Assess Orientation: : Oriented to Self, Oriented to Place Alcohol  / Substance Use: Not Applicable Psych Involvement: No (comment)  Admission diagnosis:  Pyelonephritis [N12] Sepsis secondary to UTI (HCC) [A41.9, N39.0] Patient Active Problem List   Diagnosis Date Noted   Sepsis secondary to UTI (HCC) 03/30/2023   Prolonged QT interval 03/30/2023   Hyponatremia 03/30/2023   Physical deconditioning 03/23/2023   Xerostomia 03/09/2023   Acute on chronic diastolic (congestive) heart failure (HCC) 07/13/2022   Pressure injury of skin 04/27/2022   Hypokalemia 04/27/2022   Bilateral pleural effusion 04/24/2022   Need for emotional support 04/24/2022   High risk medication use 04/24/2022   Pain 04/24/2022   DNR (do not resuscitate) 04/24/2022   End of life care 04/24/2022   Dyspnea 04/24/2022   Palliative care encounter 04/21/2022   Goals of care, counseling/discussion 04/21/2022  Counseling and coordination of care 04/21/2022   Malnutrition of moderate degree 04/20/2022   Atrial fibrillation with RVR (HCC) 04/18/2022   Hypotension 04/18/2022   Failure to thrive in adult 04/18/2022    Acute urinary retention 04/18/2022   RSV (respiratory syncytial virus pneumonia) 03/11/2022   COPD exacerbation (HCC) 03/09/2022   Paroxysmal atrial fibrillation with RVR (HCC) 03/09/2022   Acute respiratory failure with hypoxia (HCC) 03/09/2022   Bronchiectasis without complication (HCC) 03/04/2022   URI (upper respiratory infection) 03/04/2022   Elevated troponin    Anemia 08/08/2021   Bladder spasm 08/08/2021   Chest pain 08/08/2021   Chest pain, rule out acute myocardial infarction 08/07/2021   Chronic diastolic CHF (congestive heart failure) (HCC) 02/03/2021   Cognitive decline 01/18/2021   Right knee pain 06/08/2020   Age-related osteoporosis without current pathological fracture 05/01/2020   Nasal congestion 05/01/2020   Personal history of COVID-19 05/01/2020   Bilateral hearing loss 11/15/2019   Post-nasal drainage 11/15/2019   COPD (chronic obstructive pulmonary disease) (HCC) 10/30/2019   Atrial fibrillation (HCC)    Lobar pneumonia (HCC) 09/18/2019   Adhesive capsulitis of right shoulder 07/29/2019   Metabolic encephalopathy 07/29/2019   Dysphagia due to suspected esophageal stenosis 07/18/2019   S/P reverse total shoulder arthroplasty, 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   Rotator cuff arthropathy of left shoulder 05/03/2019   Shoulder pain, left 04/29/2019   Low back pain 03/13/2019   Degeneration of lumbar intervertebral disc 02/14/2019   Amnesia 02/06/2018   ETD (Eustachian tube dysfunction), bilateral 11/07/2017   Chronic nonintractable headache 11/07/2017   Otalgia 10/17/2017   Pain in joint of right elbow 05/02/2017   Rib lesion 04/20/2017   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   Atopic dermatitis 04/08/2016   Skin sensation disturbance 03/08/2016   Weakness 12/22/2015   Allergic rhinitis 09/15/2015   CAD (coronary artery disease) 05/18/2015   Essential hypertension 05/18/2015   Carotid artery disease (HCC) 05/18/2015   Cough 03/19/2015   Shortness of  breath 12/26/2013   Abnormal gait 11/06/2013   Idiopathic scoliosis and kyphoscoliosis 07/30/2013   Acquired unequal leg length on left 04/11/2013   History of fracture 10/18/2012   Palpitations 10/18/2012   Constipation 09/05/2012   History of sinus tachycardia 09/05/2012   Spasm of back muscles 03/12/2012   Lumbar radiculopathy 11/23/2011   Mitral regurgitation 11/22/2011   Low compliance bladder 04/18/2011   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   Carpal tunnel syndrome 02/23/2009   Cardiovascular symptoms 02/23/2009   Vitamin D  deficiency 02/23/2009   RECTAL BLEEDING 08/18/2008   History of colonic polyps 08/18/2008   SKIN CANCER, HX OF 08/14/2008   CARPAL TUNNEL SYNDROME, HX OF 08/14/2008   SARCOIDOSIS, PULMONARY 08/08/2008   HLD (hyperlipidemia) 08/08/2008   Paroxysmal supraventricular tachycardia (HCC) 08/08/2008   GERD (gastroesophageal reflux disease) 08/08/2008   PCP:  Amin, Saad, MD Pharmacy:   [facility] [place], [place] - [address] [place] [address] Phone: [phone] Fax: [phone]  [facility] [place], [place] - [address] [place] [address] Phone: [phone] Fax: [phone]     Social Drivers of Health (SDOH) Social History: SDOH Screenings   Food Insecurity: No Food Insecurity (03/31/2023)  Housing: Low Risk  (03/31/2023)  Transportation Needs: No Transportation Needs (03/31/2023)  Utilities: Not At Risk (03/30/2023)  Social Connections: Patient Unable To Answer (03/31/2023)  Tobacco Use: Low Risk  (03/30/2023)   SDOH Interventions: Housing Interventions: Inpatient  TOC, Intervention Not Indicated   Readmission Risk Interventions    03/31/2023   11:08 AM  Readmission Risk Prevention Plan  Transportation Screening Complete  Medication Review (RN Care Manager) Complete  PCP or Specialist appointment within 3-5 days of discharge  Complete  HRI or Home Care Consult Complete  SW Recovery Care/Counseling Consult Complete  Palliative Care Screening Complete  Skilled Nursing Facility Complete

## 2023-03-31 NOTE — Progress Notes (Addendum)
 PROGRESS NOTE    [name]  FMW:[id number] DOB: [birth year] DOA: 03/29/2023 PCP: Caleen Dirks, MD   Brief Narrative: 89 year old with past medical history significant for anemia post menorrhagia, AKI, aortic atherosclerosis, asthma, CAD, carotid stenosis, history of left hip fracture, inferior pubic ramus fracture, hyperlipidemia, hydronephrosis, right eye macular degeneration, paroxysmal ventricular tachycardia, pneumonia, sarcoidosis, [place] recurrent UTI who was recently started on antibiotics for UTI at her facility, but failed to improve.  Continue to develop generalized weakness.  History of resistant bacteria.  Presented with recurrent UTI, UA with greater than 50 white blood cell, leukocytosis white blood cell 22.   Assessment & Plan:   Principal Problem:   Sepsis secondary to UTI [facility]) Active Problems:   HLD (hyperlipidemia)   Malnutrition of moderate degree   Hypokalemia   Prolonged QT interval   Hyponatremia   1-Sepsis secondary to UTI -Patient presented with fever temperature 101, tachycardia heart rate 100, leukocytosis.  In the setting  of infection UTI, UA with more than 50 white blood cell.  Blood cultures positive, growing gram-negative rods. -Previous ESBL E. coli -Continue with meropenem  -Urine Culture; growing E coli.  -will check CT kidney.   FTT; weakness, poor oral intake.  In setting of infection.   E. coli bacteremia: -Follow final blood cultures result -Continue with meropenem  -plan to repeat Blood cultures tomorrow.   Hypokalemia: -Replace  Prolonged QT: Receive IV magnesium  Follow EKG  Hyponatremia:  Improved with fluids.  In setting hypovolemia.   Chronic diastolic heart failure Plan to hold Lasix  appear volume depleted. Monitor volume status  COPD, history of sarcoid Continue with  Budeson, Glycopyrrol, formeterol.  Duoneb  History of paroxysmal A-fib: Continue with Eliquis  Amiodarone  is not on med list anymore  Malnutrition of  moderate degree: Start Supplement  Hypothyroidism: Continue with Synthroid  TSH normal    See wound care documentation below  Pressure Injury 07/13/22 Heel Right Stage 1 -  Intact skin with non-blanchable redness of a localized area usually over a bony prominence. 1 cm x 1 cm (Active)  07/13/22   Location: Heel  Location Orientation: Right  Staging: Stage 1 -  Intact skin with non-blanchable redness of a localized area usually over a bony prominence.  Wound Description (Comments): 1 cm x 1 cm  Present on Admission: Yes     Estimated body mass index is 21.09 kg/m as calculated from the following:   Height as of this encounter: 5' 3 (1.6 m).   Weight as of this encounter: 54 kg.   DVT prophylaxis: eliquis  Code Status: DNR Family Communication:will update daughter  Disposition Plan:  Status is: Inpatient Remains inpatient appropriate because: management of  infection     Consultants:  None  Procedures:  none  Antimicrobials:    Subjective: She is alert, feel tire. She will eat breakfast , denies pain   Objective: Vitals:   03/30/23 2134 03/31/23 0202 03/31/23 0259 03/31/23 0459  BP: (!) 141/67 120/65  107/62  Pulse: 73 73  72  Resp: 16 16  16   Temp: 97.6 F (36.4 C) 98.4 F (36.9 C)  98 F (36.7 C)  TempSrc:      SpO2: 96% 95%  97%  Weight:   54 kg   Height:   5' 3 (1.6 m)     Intake/Output Summary (Last 24 hours) at 03/31/2023 0721 Last data filed at 03/30/2023 1035 Gross per 24 hour  Intake 100 ml  Output --  Net 100 ml   [employer]  03/31/23 0259  Weight: 54 kg    Examination:  General exam: Appears calm and comfortable  Respiratory system: Clear to auscultation. Respiratory effort normal. Cardiovascular system: S1 & S2 heard, RRR. No JVD, murmurs, rubs, gallops or clicks. No pedal edema. Gastrointestinal system: Abdomen is nondistended, soft and nontender. No organomegaly or masses felt. Normal bowel sounds heard. Central nervous  system: Alert and oriented. No focal neurological deficits. Extremities: Symmetric 5 x 5 power.   Data Reviewed: I have personally reviewed following labs and imaging studies  CBC: Recent Labs  Lab 03/30/23 0213  WBC 22.4*  NEUTROABS 19.8*  HGB 13.8  HCT 40.3  MCV 95.3  PLT 199   Basic Metabolic Panel: Recent Labs  Lab 03/30/23 0213  NA 130*  K 3.1*  CL 97*  CO2 21*  GLUCOSE 94  BUN 23  CREATININE 0.79  CALCIUM  8.7*  MG 1.9   GFR: Estimated Creatinine Clearance: 41.8 mL/min (by C-G formula based on SCr of 0.79 mg/dL). Liver Function Tests: Recent Labs  Lab 03/30/23 0213  AST 16  ALT 9  ALKPHOS 93  BILITOT 0.9  PROT 6.4*  ALBUMIN 3.0*   No results for input(s): LIPASE, AMYLASE in the last 168 hours. No results for input(s): AMMONIA in the last 168 hours. Coagulation Profile: Recent Labs  Lab 03/30/23 0213  INR 1.5*   Cardiac Enzymes: No results for input(s): CKTOTAL, CKMB, CKMBINDEX, TROPONINI in the last 168 hours. BNP (last 3 results) No results for input(s): PROBNP in the last 8760 hours. HbA1C: No results for input(s): HGBA1C in the last 72 hours. CBG: No results for input(s): GLUCAP in the last 168 hours. Lipid Profile: No results for input(s): CHOL, HDL, LDLCALC, TRIG, CHOLHDL, LDLDIRECT in the last 72 hours. Thyroid Function Tests: Recent Labs    03/30/23 0450  TSH 2.427  FREET4 1.39*   Anemia Panel: No results for input(s): VITAMINB12, FOLATE, FERRITIN, TIBC, IRON , RETICCTPCT in the last 72 hours. Sepsis Labs: Recent Labs  Lab 03/30/23 0449  LATICACIDVEN 1.4    Recent Results (from the past 240 hours)  Blood Culture (routine x 2)     Status: None (Preliminary result)   Collection Time: 03/30/23  1:11 AM   Specimen: BLOOD LEFT ARM  Result Value Ref Range Status   Specimen Description   Final    BLOOD LEFT ARM Performed at [facility], [address] [place], [place]  [address]    Special Requests   Final    BOTTLES DRAWN AEROBIC AND ANAEROBIC Blood Culture adequate volume Performed at [facility], [address], [place], [place] [address]    Culture  Setup Time   Final    GRAM NEGATIVE RODS IN BOTH AEROBIC AND ANAEROBIC BOTTLES CRITICAL VALUE NOTED.  VALUE IS CONSISTENT WITH PREVIOUSLY REPORTED AND CALLED VALUE. Performed at [facility], [address], [place], [place] [address]    Culture GRAM NEGATIVE RODS  Final   Report Status PENDING  Incomplete  Resp panel by RT-PCR (RSV, Flu A&B, Covid) Anterior Nasal Swab     Status: None   Collection Time: 03/30/23  1:11 AM   Specimen: Anterior Nasal Swab  Result Value Ref Range Status   SARS Coronavirus 2 by RT PCR NEGATIVE NEGATIVE Final    Comment: (NOTE) SARS-CoV-2 target nucleic acids are NOT DETECTED.  The SARS-CoV-2 RNA is generally detectable in upper respiratory specimens during the acute phase of infection. The lowest concentration of SARS-CoV-2 viral  copies this assay can detect is 138 copies/mL. A negative result does not preclude SARS-Cov-2 infection and should not be used as the sole basis for treatment or other patient management decisions. A negative result may occur with  improper specimen collection/handling, submission of specimen other than nasopharyngeal swab, presence of viral mutation(s) within the areas targeted by this assay, and inadequate number of viral copies(<138 copies/mL). A negative result must be combined with clinical observations, patient history, and epidemiological information. The expected result is Negative.  Fact Sheet for Patients:  bloggercourse.com  Fact Sheet for Healthcare Providers:  seriousbroker.it  This test is no t yet approved or cleared by the [place]  FDA and  has been authorized for detection and/or diagnosis of SARS-CoV-2 by FDA under an Emergency Use  Authorization (EUA). This EUA will remain  in effect (meaning this test can be used) for the duration of the COVID-19 declaration under Section 564(b)(1) of the Act, 21 U.S.C.section 360bbb-3(b)(1), unless the authorization is terminated  or revoked sooner.       Influenza A by PCR NEGATIVE NEGATIVE Final   Influenza B by PCR NEGATIVE NEGATIVE Final    Comment: (NOTE) The Xpert Xpress SARS-CoV-2/FLU/RSV plus assay is intended as an aid in the diagnosis of influenza from Nasopharyngeal swab specimens and should not be used as a sole basis for treatment. Nasal washings and aspirates are unacceptable for Xpert Xpress SARS-CoV-2/FLU/RSV testing.  Fact Sheet for Patients: bloggercourse.com  Fact Sheet for Healthcare Providers: seriousbroker.it  This test is not yet approved or cleared by the [place]  FDA and has been authorized for detection and/or diagnosis of SARS-CoV-2 by FDA under an Emergency Use Authorization (EUA). This EUA will remain in effect (meaning this test can be used) for the duration of the COVID-19 declaration under Section 564(b)(1) of the Act, 21 U.S.C. section 360bbb-3(b)(1), unless the authorization is terminated or revoked.     Resp Syncytial Virus by PCR NEGATIVE NEGATIVE Final    Comment: (NOTE) Fact Sheet for Patients: bloggercourse.com  Fact Sheet for Healthcare Providers: seriousbroker.it  This test is not yet approved or cleared by the [place]  FDA and has been authorized for detection and/or diagnosis of SARS-CoV-2 by FDA under an Emergency Use Authorization (EUA). This EUA will remain in effect (meaning this test can be used) for the duration of the COVID-19 declaration under Section 564(b)(1) of the Act, 21 U.S.C. section 360bbb-3(b)(1), unless the authorization is terminated or revoked.  Performed at [facility],  [address] [place], [place] [address]   Blood Culture (routine x 2)     Status: None (Preliminary result)   Collection Time: 03/30/23  3:26 AM   Specimen: BLOOD RIGHT ARM  Result Value Ref Range Status   Specimen Description   Final    BLOOD RIGHT ARM Performed at [facility], [address], [place], [place] [address]    Special Requests   Final    BOTTLES DRAWN AEROBIC AND ANAEROBIC Blood Culture adequate volume Performed at [facility], [address], [place], [place] [address]    Culture  Setup Time   Final    GRAM NEGATIVE RODS IN BOTH AEROBIC AND ANAEROBIC BOTTLES CRITICAL RESULT CALLED TO, READ BACK BY AND VERIFIED WITH: PHARMD ANH PHAM ON 03/30/23 @ 1818 BY DRT    Culture   Final    CULTURE REINCUBATED FOR BETTER GROWTH Performed at [facility], [address] [place], [place] [address]  Report Status PENDING  Incomplete  Blood Culture ID Panel (Reflexed)     Status: Abnormal   Collection Time: 03/30/23  3:26 AM  Result Value Ref Range Status   Enterococcus faecalis NOT DETECTED NOT DETECTED Final   Enterococcus Faecium NOT DETECTED NOT DETECTED Final   Listeria monocytogenes NOT DETECTED NOT DETECTED Final   Staphylococcus species NOT DETECTED NOT DETECTED Final   Staphylococcus aureus (BCID) NOT DETECTED NOT DETECTED Final   Staphylococcus epidermidis NOT DETECTED NOT DETECTED Final   Staphylococcus lugdunensis NOT DETECTED NOT DETECTED Final   Streptococcus species NOT DETECTED NOT DETECTED Final   Streptococcus agalactiae NOT DETECTED NOT DETECTED Final   Streptococcus pneumoniae NOT DETECTED NOT DETECTED Final   Streptococcus pyogenes NOT DETECTED NOT DETECTED Final   A.calcoaceticus-baumannii NOT DETECTED NOT DETECTED Final   Bacteroides fragilis NOT DETECTED NOT DETECTED Final   Enterobacterales DETECTED (A) NOT DETECTED Final    Comment: Enterobacterales represent a large order of gram negative bacteria, not a single  organism. CRITICAL RESULT CALLED TO, READ BACK BY AND VERIFIED WITH: PHARMD ANH PHAM ON 03/30/23 @ 1818 BY DRT    Enterobacter cloacae complex NOT DETECTED NOT DETECTED Final   Escherichia coli DETECTED (A) NOT DETECTED Final    Comment: CRITICAL RESULT CALLED TO, READ BACK BY AND VERIFIED WITH: PHARMD ANH PHAM ON 03/30/23 @ 1818 BY DRT    Klebsiella aerogenes NOT DETECTED NOT DETECTED Final   Klebsiella oxytoca NOT DETECTED NOT DETECTED Final   Klebsiella pneumoniae NOT DETECTED NOT DETECTED Final   Proteus species NOT DETECTED NOT DETECTED Final   Salmonella species NOT DETECTED NOT DETECTED Final   Serratia marcescens NOT DETECTED NOT DETECTED Final   Haemophilus influenzae NOT DETECTED NOT DETECTED Final   Neisseria meningitidis NOT DETECTED NOT DETECTED Final   Pseudomonas aeruginosa NOT DETECTED NOT DETECTED Final   Stenotrophomonas maltophilia NOT DETECTED NOT DETECTED Final   Candida albicans NOT DETECTED NOT DETECTED Final   Candida auris NOT DETECTED NOT DETECTED Final   Candida glabrata NOT DETECTED NOT DETECTED Final   Candida krusei NOT DETECTED NOT DETECTED Final   Candida parapsilosis NOT DETECTED NOT DETECTED Final   Candida tropicalis NOT DETECTED NOT DETECTED Final   Cryptococcus neoformans/gattii NOT DETECTED NOT DETECTED Final   CTX-M ESBL DETECTED (A) NOT DETECTED Final    Comment: CRITICAL RESULT CALLED TO, READ BACK BY AND VERIFIED WITH: PHARMD ANH PHAM ON 03/30/23 @ 1818 BY DRT (NOTE) Extended spectrum beta-lactamase detected. Recommend a carbapenem as initial therapy.      Carbapenem resistance IMP NOT DETECTED NOT DETECTED Final   Carbapenem resistance KPC NOT DETECTED NOT DETECTED Final   Carbapenem resistance NDM NOT DETECTED NOT DETECTED Final   Carbapenem resist OXA 48 LIKE NOT DETECTED NOT DETECTED Final   Carbapenem resistance VIM NOT DETECTED NOT DETECTED Final    Comment: Performed at [facility], [address], [place], [place] [address]          Radiology Studies: DG Chest Port 1 View Result Date: 03/30/2023 CLINICAL DATA:  Question of sepsis to evaluate for abnormality. Weakness and urinary tract infection. EXAM: PORTABLE CHEST 1 VIEW COMPARISON:  11/09/2022 FINDINGS: Shallow inspiration. Mild cardiac enlargement. No vascular congestion, edema, or consolidation. Central interstitial changes in the lungs may represent chronic bronchitis. No pleural effusion or pneumothorax. Mediastinal contours appear intact. Calcification of the aorta. Degenerative changes in the spine and right shoulder. Postoperative change in the left shoulder. IMPRESSION: Emphysematous changes in  the lungs. Chronic bronchitic changes. Mild cardiac enlargement. No focal consolidation. Electronically Signed   By: Elsie Gravely M.D.   On: 03/30/2023 01:19        Scheduled Meds:  apixaban   2.5 mg Oral BID   Budeson-Glycopyrrol-Formoterol   2 puff Inhalation BID   calcium  carbonate  2 tablet Oral BID   cyanocobalamin   1,000 mcg Oral Daily   ferrous sulfate   325 mg Oral QODAY   guaiFENesin   1,200 mg Oral BID   levothyroxine   100 mcg Oral Daily   melatonin  5 mg Oral QHS   methenamine   1,000 mg Oral BID   oxybutynin   5 mg Oral Daily   pantoprazole   40 mg Oral Daily   polyvinyl alcohol   1 drop Both Eyes BID   sertraline   50 mg Oral QHS   Continuous Infusions:  meropenem  (MERREM ) IV 1 g (03/30/23 2345)     LOS: 1 day    Time spent: 35 minutes    Azyiah Bo A Rajean Desantiago, MD [facility]   If 7PM-7AM, please contact night-coverage [email or web address]  03/31/2023, 7:21 AM

## 2023-03-31 NOTE — [place] FL2 (Signed)
 [place]  MEDICAID FL2 LEVEL OF CARE FORM     IDENTIFICATION  Patient Name: [name] Birthdate: February 21, 1935 Sex: female Admission Date (Current Location): 03/29/2023  [facility] and [place] Number:  Producer, Television/film/video and Address:  [facility],  [address] [place], [place] [address]      Provider Number: [id number]  Attending Physician Name and Address:  Madelyne Owen LABOR, MD  Relative Name and Phone Number:  Lorren Pollen (Daughter)  [phone]    Current Level of Care: Hospital Recommended Level of Care: Skilled Nursing Facility, Nursing Facility Prior Approval Number:    Date Approved/Denied:   PASRR Number: [id number]  Discharge Plan: SNF    Current Diagnoses: Patient Active Problem List   Diagnosis Date Noted   Sepsis secondary to UTI (HCC) 03/30/2023   Prolonged QT interval 03/30/2023   Hyponatremia 03/30/2023   Physical deconditioning 03/23/2023   Xerostomia 03/09/2023   Acute on chronic diastolic (congestive) heart failure (HCC) 07/13/2022   Pressure injury of skin 04/27/2022   Hypokalemia 04/27/2022   Bilateral pleural effusion 04/24/2022   Need for emotional support 04/24/2022   High risk medication use 04/24/2022   Pain 04/24/2022   DNR (do not resuscitate) 04/24/2022   End of life care 04/24/2022   Dyspnea 04/24/2022   Palliative care encounter 04/21/2022   Goals of care, counseling/discussion 04/21/2022   Counseling and coordination of care 04/21/2022   Malnutrition of moderate degree 04/20/2022   Atrial fibrillation with RVR (HCC) 04/18/2022   Hypotension 04/18/2022   Failure to thrive in adult 04/18/2022   Acute urinary retention 04/18/2022   RSV (respiratory syncytial virus pneumonia) 03/11/2022   COPD exacerbation (HCC) 03/09/2022   Paroxysmal atrial fibrillation with RVR (HCC) 03/09/2022   Acute respiratory failure with hypoxia (HCC) 03/09/2022   Bronchiectasis without complication (HCC) 03/04/2022   URI (upper  respiratory infection) 03/04/2022   Elevated troponin    Anemia 08/08/2021   Bladder spasm 08/08/2021   Chest pain 08/08/2021   Chest pain, rule out acute myocardial infarction 08/07/2021   Chronic diastolic CHF (congestive heart failure) (HCC) 02/03/2021   Cognitive decline 01/18/2021   Right knee pain 06/08/2020   Age-related osteoporosis without current pathological fracture 05/01/2020   Nasal congestion 05/01/2020   Personal history of COVID-19 05/01/2020   Bilateral hearing loss 11/15/2019   Post-nasal drainage 11/15/2019   COPD (chronic obstructive pulmonary disease) (HCC) 10/30/2019   Atrial fibrillation (HCC)    Lobar pneumonia (HCC) 09/18/2019   Adhesive capsulitis of right shoulder 07/29/2019   Metabolic encephalopathy 07/29/2019   Dysphagia due to suspected esophageal stenosis 07/18/2019   S/P reverse total shoulder arthroplasty, 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   Rotator cuff arthropathy of left shoulder 05/03/2019   Shoulder pain, left 04/29/2019   Low back pain 03/13/2019   Degeneration of lumbar intervertebral disc 02/14/2019   Amnesia 02/06/2018   ETD (Eustachian tube dysfunction), bilateral 11/07/2017   Chronic nonintractable headache 11/07/2017   Otalgia 10/17/2017   Pain in joint of right elbow 05/02/2017   Rib lesion 04/20/2017   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   Atopic dermatitis 04/08/2016   Skin sensation disturbance 03/08/2016   Weakness 12/22/2015   Allergic rhinitis 09/15/2015   CAD (coronary artery disease) 05/18/2015   Essential hypertension 05/18/2015   Carotid artery disease (HCC) 05/18/2015   Cough 03/19/2015   Shortness of breath 12/26/2013   Abnormal gait 11/06/2013   Idiopathic scoliosis and kyphoscoliosis 07/30/2013   Acquired unequal leg length on left 04/11/2013  History of fracture 10/18/2012   Palpitations 10/18/2012   Constipation 09/05/2012   History of sinus tachycardia 09/05/2012   Spasm of back muscles 03/12/2012    Lumbar radiculopathy 11/23/2011   Mitral regurgitation 11/22/2011   Low compliance bladder 04/18/2011   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   Carpal tunnel syndrome 02/23/2009   Cardiovascular symptoms 02/23/2009   Vitamin D  deficiency 02/23/2009   RECTAL BLEEDING 08/18/2008   History of colonic polyps 08/18/2008   SKIN CANCER, HX OF 08/14/2008   CARPAL TUNNEL SYNDROME, HX OF 08/14/2008   SARCOIDOSIS, PULMONARY 08/08/2008   HLD (hyperlipidemia) 08/08/2008   Paroxysmal supraventricular tachycardia (HCC) 08/08/2008   GERD (gastroesophageal reflux disease) 08/08/2008    Orientation RESPIRATION BLADDER Height & Weight     Self, Place  Normal Incontinent, External catheter Weight: 119 lb 0.8 oz (54 kg) Height:  5' 3 (160 cm)  BEHAVIORAL SYMPTOMS/MOOD NEUROLOGICAL BOWEL NUTRITION STATUS      Continent Diet (Heart Healthy)  AMBULATORY STATUS COMMUNICATION OF NEEDS Skin   Limited Assist Verbally PU Stage and Appropriate Care (Sacrum and buttocks)                       Personal Care Assistance Level of Assistance  Bathing, Feeding, Dressing Bathing Assistance: Limited assistance Feeding assistance: Independent Dressing Assistance: Limited assistance     Functional Limitations Info  Sight, Hearing, Speech Sight Info: Adequate Hearing Info: Adequate Speech Info: Adequate    SPECIAL CARE FACTORS FREQUENCY  PT (By licensed PT), OT (By licensed OT)     PT Frequency: 5x/wk OT Frequency: 5x/wk            Contractures Contractures Info: Not present    Additional Factors Info  Code Status, Allergies Code Status Info: DNR Allergies Info: Levaquin  (Levofloxacin ), Oxycodone , Oxycodone -acetaminophen , Sulfonamide Derivatives, Sulfa  Antibiotics, Morphine            Current Medications (03/31/2023):  This is the current hospital active medication list Current Facility-Administered Medications  Medication Dose Route Frequency Provider Last Rate Last Admin   0.9 %  sodium  chloride infusion   Intravenous Continuous Regalado, Belkys A, MD       acetaminophen  (TYLENOL ) tablet 650 mg  650 mg Oral Q6H PRN Celinda Alm Lot, MD       Or   acetaminophen  (TYLENOL ) suppository 650 mg  650 mg Rectal Q6H PRN Celinda Alm Lot, MD       apixaban  (ELIQUIS ) tablet 2.5 mg  2.5 mg Oral BID Celinda Alm Lot, MD   2.5 mg at 03/30/23 2103   Budeson-Glycopyrrol-Formoterol  160-9-4.8 MCG/ACT AERO 2 puff  2 puff Inhalation BID Celinda Alm Lot, MD       calcium  carbonate (TUMS - dosed in mg elemental calcium ) chewable tablet 400 mg of elemental calcium   2 tablet Oral BID Celinda Alm Lot, MD   400 mg of elemental calcium  at 03/30/23 2102   cyanocobalamin  (VITAMIN B12) tablet 1,000 mcg  1,000 mcg Oral Daily Celinda Alm Lot, MD       ferrous sulfate  tablet 325 mg  325 mg Oral QODAY Ortiz, David Manuel, MD       guaiFENesin  (MUCINEX ) 12 hr tablet 1,200 mg  1,200 mg Oral BID Celinda Alm Lot, MD   1,200 mg at 03/30/23 2103   ipratropium-albuterol  (DUONEB) 0.5-2.5 (3) MG/3ML nebulizer solution 3 mL  3 mL Nebulization Q6H PRN Celinda Alm Lot, MD       levothyroxine  (SYNTHROID ) tablet 100 mcg  100 mcg  Oral Daily Celinda Alm Lot, MD   100 mcg at 03/31/23 0530   melatonin tablet 5 mg  5 mg Oral QHS Celinda Alm Lot, MD   5 mg at 03/30/23 2103   meropenem  (MERREM ) 1 g in sodium chloride  0.9 % 100 mL IVPB  1 g Intravenous Q12H Celinda Alm Lot, MD 200 mL/hr at 03/30/23 2345 1 g at 03/30/23 2345   methenamine  (MANDELAMINE) tablet 1,000 mg  1,000 mg Oral BID Celinda Alm Lot, MD       oxybutynin  (DITROPAN ) tablet 5 mg  5 mg Oral Daily Celinda Alm Lot, MD       pantoprazole  (PROTONIX ) EC tablet 40 mg  40 mg Oral Daily Celinda Alm Lot, MD   40 mg at 03/30/23 2102   polyethylene glycol (MIRALAX  / GLYCOLAX ) packet 17 g  17 g Oral Daily PRN Celinda Alm Lot, MD       polyvinyl alcohol  (LIQUIFILM TEARS) 1.4 % ophthalmic solution 1 drop  1 drop Both Eyes BID  Celinda Alm Lot, MD   1 drop at 03/30/23 2104   prochlorperazine  (COMPAZINE ) injection 5 mg  5 mg Intravenous Q4H PRN Celinda Alm Lot, MD       sertraline  (ZOLOFT ) tablet 50 mg  50 mg Oral QHS Celinda Alm Lot, MD   50 mg at 03/30/23 2103     Discharge Medications: Please see discharge summary for a list of discharge medications.  Relevant Imaging Results:  Relevant Lab Results:   Additional Information SSN: [id number]  Hoy DELENA Bigness, LCSW

## 2023-03-31 NOTE — Plan of Care (Signed)
 No acute events overnight. Problem: Fluid Volume: Goal: Hemodynamic stability will improve Outcome: Progressing   Problem: Clinical Measurements: Goal: Diagnostic test results will improve Outcome: Progressing Goal: Signs and symptoms of infection will decrease Outcome: Progressing   Problem: Respiratory: Goal: Ability to maintain adequate ventilation will improve Outcome: Progressing   Problem: Education: Goal: Knowledge of General Education information will improve Description: Including pain rating scale, medication(s)/side effects and non-pharmacologic comfort measures Outcome: Progressing   Problem: Health Behavior/Discharge Planning: Goal: Ability to manage health-related needs will improve Outcome: Progressing   Problem: Clinical Measurements: Goal: Ability to maintain clinical measurements within normal limits will improve Outcome: Progressing Goal: Will remain free from infection Outcome: Progressing Goal: Diagnostic test results will improve Outcome: Progressing Goal: Respiratory complications will improve Outcome: Progressing Goal: Cardiovascular complication will be avoided Outcome: Progressing   Problem: Activity: Goal: Risk for activity intolerance will decrease Outcome: Progressing   Problem: Nutrition: Goal: Adequate nutrition will be maintained Outcome: Progressing   Problem: Coping: Goal: Level of anxiety will decrease Outcome: Progressing   Problem: Elimination: Goal: Will not experience complications related to bowel motility Outcome: Progressing Goal: Will not experience complications related to urinary retention Outcome: Progressing   Problem: Pain Managment: Goal: General experience of comfort will improve and/or be controlled Outcome: Progressing   Problem: Safety: Goal: Ability to remain free from injury will improve Outcome: Progressing   Problem: Skin Integrity: Goal: Risk for impaired skin integrity will decrease Outcome:  Progressing

## 2023-04-01 DIAGNOSIS — N390 Urinary tract infection, site not specified: Secondary | ICD-10-CM | POA: Diagnosis not present

## 2023-04-01 DIAGNOSIS — A419 Sepsis, unspecified organism: Secondary | ICD-10-CM | POA: Diagnosis not present

## 2023-04-01 LAB — CBC WITH DIFFERENTIAL/PLATELET
Abs Immature Granulocytes: 0.7 10*3/uL — ABNORMAL HIGH (ref 0.00–0.07)
Basophils Absolute: 0.1 10*3/uL (ref 0.0–0.1)
Basophils Relative: 0 %
Eosinophils Absolute: 0.1 10*3/uL (ref 0.0–0.5)
Eosinophils Relative: 1 %
HCT: 36.5 % (ref 36.0–46.0)
Hemoglobin: 12.2 g/dL (ref 12.0–15.0)
Immature Granulocytes: 5 %
Lymphocytes Relative: 8 %
Lymphs Abs: 1.1 10*3/uL (ref 0.7–4.0)
MCH: 31.8 pg (ref 26.0–34.0)
MCHC: 33.4 g/dL (ref 30.0–36.0)
MCV: 95.1 fL (ref 80.0–100.0)
Monocytes Absolute: 0.9 10*3/uL (ref 0.1–1.0)
Monocytes Relative: 7 %
Neutro Abs: 11.2 10*3/uL — ABNORMAL HIGH (ref 1.7–7.7)
Neutrophils Relative %: 79 %
Platelets: 183 10*3/uL (ref 150–400)
RBC: 3.84 MIL/uL — ABNORMAL LOW (ref 3.87–5.11)
RDW: 15.5 % (ref 11.5–15.5)
WBC: 14 10*3/uL — ABNORMAL HIGH (ref 4.0–10.5)
nRBC: 0 % (ref 0.0–0.2)

## 2023-04-01 LAB — BASIC METABOLIC PANEL
Anion gap: 8 (ref 5–15)
BUN: 15 mg/dL (ref 8–23)
CO2: 17 mmol/L — ABNORMAL LOW (ref 22–32)
Calcium: 7.4 mg/dL — ABNORMAL LOW (ref 8.9–10.3)
Chloride: 112 mmol/L — ABNORMAL HIGH (ref 98–111)
Creatinine, Ser: 0.64 mg/dL (ref 0.44–1.00)
GFR, Estimated: >60 mL/min (ref >60)
Glucose, Bld: 88 mg/dL (ref 70–99)
Potassium: 4.5 mmol/L (ref 3.5–5.1)
Sodium: 137 mmol/L (ref 135–145)

## 2023-04-01 LAB — CULTURE, BLOOD (ROUTINE X 2): Special Requests: ADEQUATE

## 2023-04-01 LAB — VITAMIN B12: Vitamin B-12: 3305 pg/mL — ABNORMAL HIGH (ref 180–914)

## 2023-04-01 LAB — URINE CULTURE: Culture: >=100000 — AB

## 2023-04-01 LAB — MAGNESIUM: Magnesium: 2.3 mg/dL (ref 1.7–2.4)

## 2023-04-01 MED ORDER — SACCHAROMYCES BOULARDII 250 MG PO CAPS
250.0000 mg | ORAL_CAPSULE | Freq: Two times a day (BID) | ORAL | Status: DC
  Administered 2023-04-01 – 2023-04-07 (×8): 250 mg via ORAL
  Filled 2023-04-01 (×8): qty 1

## 2023-04-01 MED ORDER — LACTATED RINGERS IV SOLN: INTRAVENOUS | Status: AC

## 2023-04-01 MED ORDER — POLYETHYLENE GLYCOL 3350 17 G PO PACK: 17.0000 g | PACK | Freq: Two times a day (BID) | ORAL | Status: DC

## 2023-04-01 MED ORDER — SENNOSIDES-DOCUSATE SODIUM 8.6-50 MG PO TABS: 1.0000 | ORAL_TABLET | Freq: Two times a day (BID) | ORAL | Status: DC

## 2023-04-01 MED ORDER — SODIUM BICARBONATE 650 MG PO TABS
650.0000 mg | ORAL_TABLET | Freq: Three times a day (TID) | ORAL | Status: DC
  Administered 2023-04-01 – 2023-04-07 (×16): 650 mg via ORAL
  Filled 2023-04-01 (×18): qty 1

## 2023-04-01 NOTE — Plan of Care (Signed)

## 2023-04-01 NOTE — Plan of Care (Signed)
  Problem: Fluid Volume: Goal: Hemodynamic stability will improve Outcome: Progressing   Problem: Clinical Measurements: Goal: Diagnostic test results will improve Outcome: Progressing   Problem: Respiratory: Goal: Ability to maintain adequate ventilation will improve Outcome: Progressing   Problem: Education: Goal: Knowledge of General Education information will improve Description: Including pain rating scale, medication(s)/side effects and non-pharmacologic comfort measures Outcome: Progressing   Problem: Health Behavior/Discharge Planning: Goal: Ability to manage health-related needs will improve Outcome: Progressing   Problem: Clinical Measurements: Goal: Ability to maintain clinical measurements within normal limits will improve Outcome: Progressing Goal: Will remain free from infection Outcome: Progressing Goal: Diagnostic test results will improve Outcome: Progressing Goal: Respiratory complications will improve Outcome: Progressing Goal: Cardiovascular complication will be avoided Outcome: Progressing   Problem: Clinical Measurements: Goal: Signs and symptoms of infection will decrease Outcome: Not Progressing   Problem: Activity: Goal: Risk for activity intolerance will decrease Outcome: Not Progressing   Problem: Nutrition: Goal: Adequate nutrition will be maintained Outcome: Not Progressing

## 2023-04-01 NOTE — Progress Notes (Signed)
 Mobility Specialist - Progress Note   04/01/23 0908  Mobility  Activity Moved into chair position in bed  Activity Response Tolerated well  Mobility Referral Yes  Mobility visit 1 Mobility  Mobility Specialist Start Time (ACUTE ONLY) [phone]  Mobility Specialist Stop Time (ACUTE ONLY) 0907  Mobility Specialist Time Calculation (min) (ACUTE ONLY) 4 min   Pt received in bed declining mobility. Pt agreeable to move bed into chair position for breakfast. Agreeable to attempt ambulation later today. No complaints during session. Pt to bed during session with all needs met. RN in room.    [facility]

## 2023-04-01 NOTE — Progress Notes (Signed)
 PROGRESS NOTE    [name]  FMW:[id number] DOB: October 27, 1934 DOA: 03/29/2023 PCP: Caleen Dirks, MD   Brief Narrative: 89 year old with past medical history significant for anemia post menorrhagia, AKI, aortic atherosclerosis, asthma, CAD, carotid stenosis, history of left hip fracture, inferior pubic ramus fracture, hyperlipidemia, hydronephrosis, right eye macular degeneration, paroxysmal ventricular tachycardia, pneumonia, sarcoidosis, [place] recurrent UTI who was recently started on antibiotics for UTI at her facility, but failed to improve.  Continue to develop generalized weakness.  History of resistant bacteria.  Presented with recurrent UTI, UA with greater than 50 white blood cell, leukocytosis white blood cell 22.   Assessment & Plan:   Principal Problem:   Sepsis secondary to UTI [facility]) Active Problems:   HLD (hyperlipidemia)   Malnutrition of moderate degree   Hypokalemia   Prolonged QT interval   Hyponatremia   1-Sepsis secondary to UTI -Patient presented with fever temperature 101, tachycardia heart rate 100, leukocytosis.  In the setting  of infection UTI, UA with more than 50 white blood cell.  Blood cultures positive, growing gram-negative rods. -Previous ESBL E. coli -Continue with meropenem  -Urine Culture; growing E coli.  -CT kidney.  Known obstructing 3 mm renal calculus, moderate retained stool throughout the colon consistent with constipation.  Chronic bilateral pleural effusion -White Blood cell trending down today at 14  Acute Metabolic Encephalopathy Appears more confuse, daughter report patient have been more confuse.  Likely in setting of infection.  Will proceed with MRI  FTT; weakness, poor oral intake.  In setting of infection.  Continue low rate IV fluids, family reports that she have not been eating  E. coli bacteremia: -Follow final blood cultures result pending -Continue with meropenem  -plan to repeat Blood cultures today.   Metabolic  acidosis: Start sodium bicarb tablet  Hypokalemia: -Replaced  Prolonged QT: Receive IV magnesium  Follow EKG  Hyponatremia:  Improved with fluids.  In setting hypovolemia.   Chronic diastolic heart failure Plan to hold Lasix  appear volume depleted. Monitor volume status Not eating continue to hold Lasix   COPD, history of sarcoid Continue with  Budeson, Glycopyrrol, formeterol.  Duoneb  History of paroxysmal A-fib: Continue with Eliquis  Amiodarone  is not on med list anymore  Malnutrition of moderate degree: Started  Supplement  Hypothyroidism: Continue with Synthroid  TSH normal  Chronic bilateral pleural effusion  See wound care documentation below  Pressure Injury 07/13/22 Heel Right Stage 1 -  Intact skin with non-blanchable redness of a localized area usually over a bony prominence. 1 cm x 1 cm (Active)  07/13/22   Location: Heel  Location Orientation: Right  Staging: Stage 1 -  Intact skin with non-blanchable redness of a localized area usually over a bony prominence.  Wound Description (Comments): 1 cm x 1 cm  Present on Admission: Yes     Estimated body mass index is 21.09 kg/m as calculated from the following:   Height as of this encounter: 5' 3 (1.6 m).   Weight as of this encounter: 54 kg.   DVT prophylaxis: eliquis  Code Status: DNR Family Communication:will update daughter  Disposition Plan:  Status is: Inpatient Remains inpatient appropriate because: management of  infection     Consultants:  None  Procedures:  none  Antimicrobials:    Subjective: She is alert, she does not feel well today.  She needs to be repositioned in bed.  She just had a bowel movement, she did not realize.  Per daughter patient appears to be more confused, not herself.  She is  supposed to follow-up with her eye doctor for some vision changes  Objective: Vitals:   03/31/23 0459 03/31/23 1336 03/31/23 1942 04/01/23 1218  BP: 107/62 130/65 (!) 143/70 (!) 149/66   Pulse: 72 71 76 72  Resp: 16 15 18 18   Temp: 98 F (36.7 C) 97.9 F (36.6 C) 98.5 F (36.9 C) 97.6 F (36.4 C)  TempSrc:      SpO2: 97% 97% 96% 99%  Weight:      Height:        Intake/Output Summary (Last 24 hours) at 04/01/2023 1313 Last data filed at 04/01/2023 0618 Gross per 24 hour  Intake 568.17 ml  Output 1050 ml  Net -481.83 ml   Filed Weights   03/31/23 0259  Weight: 54 kg    Examination:  General exam: NAD Respiratory system: CTA Cardiovascular system: S 1, S 2 RRR Gastrointestinal system:  BS present, soft, nt Central nervous system: alert Extremities: no edema    Data Reviewed: I have personally reviewed following labs and imaging studies  CBC: Recent Labs  Lab 03/30/23 0213 03/31/23 0556 04/01/23 0706  WBC 22.4* 21.1* 14.0*  NEUTROABS 19.8* 18.3* 11.2*  HGB 13.8 11.7* 12.2  HCT 40.3 35.7* 36.5  MCV 95.3 98.1 95.1  PLT 199 171 183   Basic Metabolic Panel: Recent Labs  Lab 03/30/23 0213 03/31/23 0556 04/01/23 0706  NA 130* 136 137  K 3.1* 3.1* 4.5  CL 97* 108 112*  CO2 21* 20* 17*  GLUCOSE 94 95 88  BUN 23 18 15   CREATININE 0.79 0.61 0.64  CALCIUM  8.7* 7.9* 7.4*  MG 1.9  --  2.3   GFR: Estimated Creatinine Clearance: 41.8 mL/min (by C-G formula based on SCr of 0.64 mg/dL). Liver Function Tests: Recent Labs  Lab 03/30/23 0213 03/31/23 0556  AST 16 25  ALT 9 12  ALKPHOS 93 87  BILITOT 0.9 0.7  PROT 6.4* 5.0*  ALBUMIN 3.0* 2.2*   No results for input(s): LIPASE, AMYLASE in the last 168 hours. No results for input(s): AMMONIA in the last 168 hours. Coagulation Profile: Recent Labs  Lab 03/30/23 0213  INR 1.5*   Cardiac Enzymes: No results for input(s): CKTOTAL, CKMB, CKMBINDEX, TROPONINI in the last 168 hours. BNP (last 3 results) No results for input(s): PROBNP in the last 8760 hours. HbA1C: No results for input(s): HGBA1C in the last 72 hours. CBG: No results for input(s): GLUCAP in the last 168  hours. Lipid Profile: No results for input(s): CHOL, HDL, LDLCALC, TRIG, CHOLHDL, LDLDIRECT in the last 72 hours. Thyroid Function Tests: Recent Labs    03/30/23 0450  TSH 2.427  FREET4 1.39*   Anemia Panel: Recent Labs    04/01/23 1004  VITAMINB12 3,305*   Sepsis Labs: Recent Labs  Lab 03/30/23 0449  LATICACIDVEN 1.4    Recent Results (from the past 240 hours)  Blood Culture (routine x 2)     Status: Abnormal   Collection Time: 03/30/23  1:11 AM   Specimen: BLOOD LEFT ARM  Result Value Ref Range Status   Specimen Description   Final    BLOOD LEFT ARM Performed at [facility], [address] [place], [place] [address]    Special Requests   Final    BOTTLES DRAWN AEROBIC AND ANAEROBIC Blood Culture adequate volume Performed at [facility], [address], [place], [place] [address]    Culture  Setup Time   Final    GRAM NEGATIVE RODS IN  BOTH AEROBIC AND ANAEROBIC BOTTLES CRITICAL VALUE NOTED.  VALUE IS CONSISTENT WITH PREVIOUSLY REPORTED AND CALLED VALUE.    Culture (A)  Final    ESCHERICHIA COLI SUSCEPTIBILITIES PERFORMED ON PREVIOUS CULTURE WITHIN THE LAST 5 DAYS. Performed at [facility], [address], [place], [place] [address]    Report Status 04/01/2023 FINAL  Final  Resp panel by RT-PCR (RSV, Flu A&B, Covid) Anterior Nasal Swab     Status: None   Collection Time: 03/30/23  1:11 AM   Specimen: Anterior Nasal Swab  Result Value Ref Range Status   SARS Coronavirus 2 by RT PCR NEGATIVE NEGATIVE Final    Comment: (NOTE) SARS-CoV-2 target nucleic acids are NOT DETECTED.  The SARS-CoV-2 RNA is generally detectable in upper respiratory specimens during the acute phase of infection. The lowest concentration of SARS-CoV-2 viral copies this assay can detect is 138 copies/mL. A negative result does not preclude SARS-Cov-2 infection and should not be used as the sole basis for treatment or other patient management  decisions. A negative result may occur with  improper specimen collection/handling, submission of specimen other than nasopharyngeal swab, presence of viral mutation(s) within the areas targeted by this assay, and inadequate number of viral copies(<138 copies/mL). A negative result must be combined with clinical observations, patient history, and epidemiological information. The expected result is Negative.  Fact Sheet for Patients:  bloggercourse.com  Fact Sheet for Healthcare Providers:  seriousbroker.it  This test is no t yet approved or cleared by the [place]  FDA and  has been authorized for detection and/or diagnosis of SARS-CoV-2 by FDA under an Emergency Use Authorization (EUA). This EUA will remain  in effect (meaning this test can be used) for the duration of the COVID-19 declaration under Section 564(b)(1) of the Act, 21 U.S.C.section 360bbb-3(b)(1), unless the authorization is terminated  or revoked sooner.       Influenza A by PCR NEGATIVE NEGATIVE Final   Influenza B by PCR NEGATIVE NEGATIVE Final    Comment: (NOTE) The Xpert Xpress SARS-CoV-2/FLU/RSV plus assay is intended as an aid in the diagnosis of influenza from Nasopharyngeal swab specimens and should not be used as a sole basis for treatment. Nasal washings and aspirates are unacceptable for Xpert Xpress SARS-CoV-2/FLU/RSV testing.  Fact Sheet for Patients: bloggercourse.com  Fact Sheet for Healthcare Providers: seriousbroker.it  This test is not yet approved or cleared by the [place]  FDA and has been authorized for detection and/or diagnosis of SARS-CoV-2 by FDA under an Emergency Use Authorization (EUA). This EUA will remain in effect (meaning this test can be used) for the duration of the COVID-19 declaration under Section 564(b)(1) of the Act, 21 U.S.C. section 360bbb-3(b)(1), unless the  authorization is terminated or revoked.     Resp Syncytial Virus by PCR NEGATIVE NEGATIVE Final    Comment: (NOTE) Fact Sheet for Patients: bloggercourse.com  Fact Sheet for Healthcare Providers: seriousbroker.it  This test is not yet approved or cleared by the [place]  FDA and has been authorized for detection and/or diagnosis of SARS-CoV-2 by FDA under an Emergency Use Authorization (EUA). This EUA will remain in effect (meaning this test can be used) for the duration of the COVID-19 declaration under Section 564(b)(1) of the Act, 21 U.S.C. section 360bbb-3(b)(1), unless the authorization is terminated or revoked.  Performed at [facility], [address], [place], [place] [address]   Urine Culture     Status: Abnormal   Collection Time: 03/30/23  1:21 AM  Specimen: Urine, Random  Result Value Ref Range Status   Specimen Description   Final    URINE, RANDOM Performed at [facility], [address] [place], [place] [address]    Special Requests   Final    NONE Reflexed from [id number] Performed at [facility], [address], [place], [place] [address]    Culture (A)  Final    >=100,000 COLONIES/mL ESCHERICHIA COLI Confirmed Extended Spectrum Beta-Lactamase Producer (ESBL).  In bloodstream infections from ESBL organisms, carbapenems are preferred over piperacillin/tazobactam. They are shown to have a lower risk of mortality. Two isolates with different morphologies were identified as the same organism.The most resistant organism was reported. Performed at [facility], [address] [place], [place] [address]    Report Status 04/01/2023 FINAL  Final   Organism ID, Bacteria ESCHERICHIA COLI (A)  Final      Susceptibility   Escherichia coli - MIC*    AMPICILLIN  >=32 RESISTANT Resistant     CEFAZOLIN  >=64 RESISTANT Resistant     CEFEPIME  >=32 RESISTANT  Resistant     CEFTRIAXONE  >=64 RESISTANT Resistant     CIPROFLOXACIN  >=4 RESISTANT Resistant     GENTAMICIN >=16 RESISTANT Resistant     IMIPENEM <=0.25 SENSITIVE Sensitive     NITROFURANTOIN 128 RESISTANT Resistant     TRIMETH /SULFA  <=20 SENSITIVE Sensitive     AMPICILLIN /SULBACTAM >=32 RESISTANT Resistant     PIP/TAZO >=128 RESISTANT Resistant ug/mL    * >=100,000 COLONIES/mL ESCHERICHIA COLI  Blood Culture (routine x 2)     Status: Abnormal (Preliminary result)   Collection Time: 03/30/23  3:26 AM   Specimen: BLOOD RIGHT ARM  Result Value Ref Range Status   Specimen Description   Final    BLOOD RIGHT ARM Performed at [facility], [address] [place], [place] [address]    Special Requests   Final    BOTTLES DRAWN AEROBIC AND ANAEROBIC Blood Culture adequate volume Performed at [facility], [address], [place], [place] [address]    Culture  Setup Time   Final    GRAM NEGATIVE RODS IN BOTH AEROBIC AND ANAEROBIC BOTTLES CRITICAL RESULT CALLED TO, READ BACK BY AND VERIFIED WITH: PHARMD ANH PHAM ON 03/30/23 @ 1818 BY DRT Performed at [facility], [address] [place], [place] [address]    Culture (A)  Final    ESCHERICHIA COLI Confirmed Extended Spectrum Beta-Lactamase Producer (ESBL).  In bloodstream infections from ESBL organisms, carbapenems are preferred over piperacillin/tazobactam. They are shown to have a lower risk of mortality.    Report Status PENDING  Incomplete   Organism ID, Bacteria ESCHERICHIA COLI  Final      Susceptibility   Escherichia coli - MIC*    AMPICILLIN  >=32 RESISTANT Resistant     CEFEPIME  >=32 RESISTANT Resistant     CEFTAZIDIME RESISTANT Resistant     CEFTRIAXONE  >=64 RESISTANT Resistant     CIPROFLOXACIN  >=4 RESISTANT Resistant     GENTAMICIN >=16 RESISTANT Resistant     IMIPENEM <=0.25 SENSITIVE Sensitive     TRIMETH /SULFA  <=20 SENSITIVE Sensitive     AMPICILLIN /SULBACTAM >=32 RESISTANT Resistant      PIP/TAZO >=128 RESISTANT Resistant ug/mL    * ESCHERICHIA COLI  Blood Culture ID Panel (Reflexed)     Status: Abnormal   Collection Time: 03/30/23  3:26 AM  Result Value Ref Range Status   Enterococcus faecalis NOT DETECTED NOT DETECTED Final  Enterococcus Faecium NOT DETECTED NOT DETECTED Final   Listeria monocytogenes NOT DETECTED NOT DETECTED Final   Staphylococcus species NOT DETECTED NOT DETECTED Final   Staphylococcus aureus (BCID) NOT DETECTED NOT DETECTED Final   Staphylococcus epidermidis NOT DETECTED NOT DETECTED Final   Staphylococcus lugdunensis NOT DETECTED NOT DETECTED Final   Streptococcus species NOT DETECTED NOT DETECTED Final   Streptococcus agalactiae NOT DETECTED NOT DETECTED Final   Streptococcus pneumoniae NOT DETECTED NOT DETECTED Final   Streptococcus pyogenes NOT DETECTED NOT DETECTED Final   A.calcoaceticus-baumannii NOT DETECTED NOT DETECTED Final   Bacteroides fragilis NOT DETECTED NOT DETECTED Final   Enterobacterales DETECTED (A) NOT DETECTED Final    Comment: Enterobacterales represent a large order of gram negative bacteria, not a single organism. CRITICAL RESULT CALLED TO, READ BACK BY AND VERIFIED WITH: PHARMD ANH PHAM ON 03/30/23 @ 1818 BY DRT    Enterobacter cloacae complex NOT DETECTED NOT DETECTED Final   Escherichia coli DETECTED (A) NOT DETECTED Final    Comment: CRITICAL RESULT CALLED TO, READ BACK BY AND VERIFIED WITH: PHARMD ANH PHAM ON 03/30/23 @ 1818 BY DRT    Klebsiella aerogenes NOT DETECTED NOT DETECTED Final   Klebsiella oxytoca NOT DETECTED NOT DETECTED Final   Klebsiella pneumoniae NOT DETECTED NOT DETECTED Final   Proteus species NOT DETECTED NOT DETECTED Final   Salmonella species NOT DETECTED NOT DETECTED Final   Serratia marcescens NOT DETECTED NOT DETECTED Final   Haemophilus influenzae NOT DETECTED NOT DETECTED Final   Neisseria meningitidis NOT DETECTED NOT DETECTED Final   Pseudomonas aeruginosa NOT DETECTED NOT DETECTED  Final   Stenotrophomonas maltophilia NOT DETECTED NOT DETECTED Final   Candida albicans NOT DETECTED NOT DETECTED Final   Candida auris NOT DETECTED NOT DETECTED Final   Candida glabrata NOT DETECTED NOT DETECTED Final   Candida krusei NOT DETECTED NOT DETECTED Final   Candida parapsilosis NOT DETECTED NOT DETECTED Final   Candida tropicalis NOT DETECTED NOT DETECTED Final   Cryptococcus neoformans/gattii NOT DETECTED NOT DETECTED Final   CTX-M ESBL DETECTED (A) NOT DETECTED Final    Comment: CRITICAL RESULT CALLED TO, READ BACK BY AND VERIFIED WITH: PHARMD ANH PHAM ON 03/30/23 @ 1818 BY DRT (NOTE) Extended spectrum beta-lactamase detected. Recommend a carbapenem as initial therapy.      Carbapenem resistance IMP NOT DETECTED NOT DETECTED Final   Carbapenem resistance KPC NOT DETECTED NOT DETECTED Final   Carbapenem resistance NDM NOT DETECTED NOT DETECTED Final   Carbapenem resist OXA 48 LIKE NOT DETECTED NOT DETECTED Final   Carbapenem resistance VIM NOT DETECTED NOT DETECTED Final    Comment: Performed at [facility], [address], [place], [place] [address]         Radiology Studies: CT RENAL STONE STUDY Result Date: 03/31/2023 CLINICAL DATA:  Abdominal/flank pain, stone suspected EXAM: CT ABDOMEN AND PELVIS WITHOUT CONTRAST TECHNIQUE: Multidetector CT imaging of the abdomen and pelvis was performed following the standard protocol without IV contrast. RADIATION DOSE REDUCTION: This exam was performed according to the departmental dose-optimization program which includes automated exposure control, adjustment of the mA and/or kV according to patient size and/or use of iterative reconstruction technique. COMPARISON:  04/18/2022 FINDINGS: Lower chest: Stable hiatal hernia. Chronic bilateral pleural effusions and dependent lower lobe atelectasis, left greater than right. Hepatobiliary: Unremarkable unenhanced appearance of the liver and gallbladder. No biliary duct dilation.  Pancreas: Extensive fatty infiltration of the pancreas. No acute inflammatory changes or pancreatic duct dilation. Spleen: Unremarkable unenhanced  appearance. Adrenals/Urinary Tract: Stable nonobstructing 3 mm calculus lower pole left kidney. No right-sided calculi. Stable simple right renal cyst does not require specific imaging follow-up. Stable appearance of the adrenals. Bladder is decompressed, limiting its evaluation. Stomach/Bowel: No bowel obstruction or ileus. Moderate stool throughout the colon consistent with constipation. No bowel wall thickening or inflammatory change. Hiatal hernia. Vascular/Lymphatic: Aortic atherosclerosis. No enlarged abdominal or pelvic lymph nodes. Reproductive: Prior hysterectomy.  No adnexal masses. Other: Trace pelvic free fluid, nonspecific. No free intraperitoneal gas. Small fat containing umbilical hernia. Small left inguinal hernia containing fat and trace fluid. No bowel herniation. Musculoskeletal: Stable right hip arthroplasty and left hip ORIF. Chronic healed fractures of the bilateral iliac bones, inferior pubic rami, and sacral ala. Chronic compression deformities of the superior endplates at L4 and L5. No acute bony abnormalities. Reconstructed images demonstrate no additional findings. IMPRESSION: 1. Nonobstructing 3 mm left renal calculus. 2. Moderate retained stool throughout the colon consistent with constipation. No bowel obstruction or ileus. 3. Small fat containing umbilical and left inguinal hernias, stable. No bowel herniation. 4. Stable hiatal hernia. 5. Chronic bilateral pleural effusions and dependent lower lobe atelectasis, left greater than right, unchanged since previous exam. 6.  Aortic Atherosclerosis (ICD10-I70.0). Electronically Signed   By: Ozell Daring M.D.   On: 03/31/2023 19:50        Scheduled Meds:  apixaban   2.5 mg Oral BID   Budeson-Glycopyrrol-Formoterol   2 puff Inhalation BID   calcium  carbonate  2 tablet Oral BID    cyanocobalamin   1,000 mcg Oral Daily   feeding supplement  237 mL Oral BID BM   ferrous sulfate   325 mg Oral QODAY   guaiFENesin   1,200 mg Oral BID   levothyroxine   100 mcg Oral Daily   melatonin  5 mg Oral QHS   methenamine   1,000 mg Oral BID   oxybutynin   5 mg Oral Daily   pantoprazole   40 mg Oral Daily   polyvinyl alcohol   1 drop Both Eyes BID   saccharomyces boulardii  250 mg Oral BID   sertraline   50 mg Oral QHS   sodium bicarbonate   650 mg Oral TID   Continuous Infusions:  lactated ringers  40 mL/hr at 04/01/23 1043   meropenem  (MERREM ) IV 1 g (04/01/23 1105)     LOS: 2 days    Time spent: 35 minutes    Deirdre Gryder A Elmus Mathes, MD [facility]   If 7PM-7AM, please contact night-coverage [email or web address]  04/01/2023, 1:13 PM

## 2023-04-02 ENCOUNTER — Inpatient Hospital Stay (HOSPITAL_COMMUNITY): Payer: Medicare Other

## 2023-04-02 DIAGNOSIS — N390 Urinary tract infection, site not specified: Secondary | ICD-10-CM | POA: Diagnosis not present

## 2023-04-02 DIAGNOSIS — A419 Sepsis, unspecified organism: Secondary | ICD-10-CM | POA: Diagnosis not present

## 2023-04-02 LAB — BASIC METABOLIC PANEL
Anion gap: 9 (ref 5–15)
BUN: 11 mg/dL (ref 8–23)
CO2: 20 mmol/L — ABNORMAL LOW (ref 22–32)
Calcium: 7.6 mg/dL — ABNORMAL LOW (ref 8.9–10.3)
Chloride: 111 mmol/L (ref 98–111)
Creatinine, Ser: 0.51 mg/dL (ref 0.44–1.00)
GFR, Estimated: >60 mL/min (ref >60)
Glucose, Bld: 88 mg/dL (ref 70–99)
Potassium: 3.3 mmol/L — ABNORMAL LOW (ref 3.5–5.1)
Sodium: 140 mmol/L (ref 135–145)

## 2023-04-02 LAB — CBC
HCT: 39 % (ref 36.0–46.0)
Hemoglobin: 12.6 g/dL (ref 12.0–15.0)
MCH: 30.5 pg (ref 26.0–34.0)
MCHC: 32.3 g/dL (ref 30.0–36.0)
MCV: 94.4 fL (ref 80.0–100.0)
Platelets: 220 10*3/uL (ref 150–400)
RBC: 4.13 MIL/uL (ref 3.87–5.11)
RDW: 15.4 % (ref 11.5–15.5)
WBC: 12.6 10*3/uL — ABNORMAL HIGH (ref 4.0–10.5)
nRBC: 0 % (ref 0.0–0.2)

## 2023-04-02 LAB — CULTURE, BLOOD (ROUTINE X 2): Special Requests: ADEQUATE

## 2023-04-02 LAB — MAGNESIUM: Magnesium: 2 mg/dL (ref 1.7–2.4)

## 2023-04-02 MED ORDER — POTASSIUM CHLORIDE CRYS ER 20 MEQ PO TBCR
40.0000 meq | EXTENDED_RELEASE_TABLET | Freq: Once | ORAL | Status: AC
  Administered 2023-04-02: 40 meq via ORAL
  Filled 2023-04-02: qty 2

## 2023-04-02 MED ORDER — BISACODYL 10 MG RE SUPP
10.0000 mg | Freq: Once | RECTAL | Status: AC
  Administered 2023-04-02: 10 mg via RECTAL
  Filled 2023-04-02: qty 1

## 2023-04-02 MED ORDER — LACTATED RINGERS IV SOLN
INTRAVENOUS | Status: DC
Start: 2023-04-02 — End: 2023-04-03

## 2023-04-02 MED ORDER — POLYETHYLENE GLYCOL 3350 17 G PO PACK
17.0000 g | PACK | Freq: Two times a day (BID) | ORAL | Status: DC
  Administered 2023-04-02 – 2023-04-07 (×7): 17 g via ORAL
  Filled 2023-04-02 (×8): qty 1

## 2023-04-02 MED ORDER — SENNOSIDES-DOCUSATE SODIUM 8.6-50 MG PO TABS
1.0000 | ORAL_TABLET | Freq: Two times a day (BID) | ORAL | Status: DC
  Administered 2023-04-02 – 2023-04-07 (×8): 1 via ORAL
  Filled 2023-04-02 (×10): qty 1

## 2023-04-02 MED ORDER — LORAZEPAM 2 MG/ML IJ SOLN
0.5000 mg | Freq: Once | INTRAMUSCULAR | Status: AC
  Administered 2023-04-02: 0.5 mg via INTRAVENOUS
  Filled 2023-04-02: qty 1

## 2023-04-02 MED ORDER — BIOTENE DRY MOUTH MT LIQD: 15.0000 mL | OROMUCOSAL | Status: DC | PRN

## 2023-04-02 NOTE — Plan of Care (Signed)
  Problem: Fluid Volume: Goal: Hemodynamic stability will improve Outcome: Progressing   Problem: Clinical Measurements: Goal: Diagnostic test results will improve Outcome: Progressing   Problem: Respiratory: Goal: Ability to maintain adequate ventilation will improve Outcome: Progressing   Problem: Education: Goal: Knowledge of General Education information will improve Description: Including pain rating scale, medication(s)/side effects and non-pharmacologic comfort measures Outcome: Progressing   Problem: Health Behavior/Discharge Planning: Goal: Ability to manage health-related needs will improve Outcome: Progressing   Problem: Clinical Measurements: Goal: Ability to maintain clinical measurements within normal limits will improve Outcome: Progressing Goal: Will remain free from infection Outcome: Progressing Goal: Diagnostic test results will improve Outcome: Progressing Goal: Respiratory complications will improve Outcome: Progressing   Problem: Elimination: Goal: Will not experience complications related to bowel motility Outcome: Progressing Goal: Will not experience complications related to urinary retention Outcome: Progressing   Problem: Activity: Goal: Risk for activity intolerance will decrease Outcome: Not Progressing   Problem: Nutrition: Goal: Adequate nutrition will be maintained Outcome: Not Progressing   Problem: Coping: Goal: Level of anxiety will decrease Outcome: Not Progressing

## 2023-04-02 NOTE — Progress Notes (Addendum)
 PROGRESS NOTE    [name]  FMW:[id number] DOB: 09/08/1934 DOA: 03/29/2023 PCP: Caleen Dirks, MD   Brief Narrative: 89 year old with past medical history significant for anemia post menorrhagia, AKI, aortic atherosclerosis, asthma, CAD, carotid stenosis, history of left hip fracture, inferior pubic ramus fracture, hyperlipidemia, hydronephrosis, right eye macular degeneration, paroxysmal ventricular tachycardia, pneumonia, sarcoidosis, [place] recurrent UTI who was recently started on antibiotics for UTI at her facility, but failed to improve.  Continue to develop generalized weakness.  History of resistant bacteria.  Presented with recurrent UTI, UA with greater than 50 white blood cell, leukocytosis white blood cell 22.   Assessment & Plan:   Principal Problem:   Sepsis secondary to UTI [facility]) Active Problems:   HLD (hyperlipidemia)   Malnutrition of moderate degree   Hypokalemia   Prolonged QT interval   Hyponatremia   1-Sepsis secondary to UTI -Patient presented with fever temperature 101, tachycardia heart rate 100, leukocytosis.  In the setting  of infection UTI, UA with more than 50 white blood cell.  Blood cultures positive, growing gram-negative rods. -Previous ESBL E. coli -Continue with meropenem  -Urine Culture; growing E coli. ESBL -CT kidney.  nonobstructing 3 mm renal calculus, moderate retained stool throughout the colon consistent with constipation.  Chronic bilateral pleural effusion -White Blood cell trending down today at 14  Acute Metabolic Encephalopathy Appears more confuse, daughter report patient have been more confuse.  Likely in setting of infection.  Will proceed with MRI, plan to have MRI today  FTT; weakness, poor oral intake.  In setting of infection.  Continue low rate IV fluids, family reports that she have not been eating Constipation:  Miralax , senna Per nurse patient had BM today   E. coli bacteremia: -Follow final blood cultures result  pending -Continue with meropenem  -2/08 Blood culture: No growth to date  Metabolic acidosis: Started  sodium bicarb tablet  Hypokalemia: -Replaced  Prolonged QT: Receive IV magnesium  Follow EKG  Hyponatremia:  Improved with fluids.  In setting hypovolemia.   Chronic diastolic heart failure Plan to hold Lasix  appear volume depleted. Monitor volume status Not eating continue to hold Lasix   COPD, history of sarcoid Continue with  Budeson, Glycopyrrol, formeterol.  Duoneb  History of paroxysmal A-fib: Continue with Eliquis  Amiodarone  is not on med list anymore  Malnutrition of moderate degree: Started  Supplement  Hypothyroidism: Continue with Synthroid  TSH normal  Chronic bilateral pleural effusion  See wound care documentation below  Pressure Injury 07/13/22 Heel Right Stage 1 -  Intact skin with non-blanchable redness of a localized area usually over a bony prominence. 1 cm x 1 cm (Active)  07/13/22   Location: Heel  Location Orientation: Right  Staging: Stage 1 -  Intact skin with non-blanchable redness of a localized area usually over a bony prominence.  Wound Description (Comments): 1 cm x 1 cm  Present on Admission: Yes     Estimated body mass index is 21.09 kg/m as calculated from the following:   Height as of this encounter: 5' 3 (1.6 m).   Weight as of this encounter: 54 kg.   DVT prophylaxis: eliquis  Code Status: DNR Family Communication: Daughter 2/08-- will update family later Disposition Plan:  Status is: Inpatient Remains inpatient appropriate because: management of  infection     Consultants:  None  Procedures:  none  Antimicrobials:    Subjective: She is alert, still not feeling well. Asking to be reposition in bed. We assist her.  Had BM this am per nurse.  Awaiting to go to have MRI  Objective: Vitals:   03/31/23 1942 04/01/23 1218 04/01/23 2022 04/02/23 0551  BP: (!) 143/70 (!) 149/66 (!) 153/77 (!) 148/79  Pulse: 76 72  70 67  Resp: 18 18 18 18   Temp: 98.5 F (36.9 C) 97.6 F (36.4 C) 98.6 F (37 C) 98 F (36.7 C)  TempSrc:    Oral  SpO2: 96% 99% 99% 99%  Weight:      Height:        Intake/Output Summary (Last 24 hours) at 04/02/2023 1357 Last data filed at 04/02/2023 0800 Gross per 24 hour  Intake 331.33 ml  Output 1100 ml  Net -768.67 ml   Filed Weights   03/31/23 0259  Weight: 54 kg    Examination:  General exam: NAD Respiratory system:  CTA Cardiovascular system: S 1, S 2 RRR Gastrointestinal system:  BS present soft, nt Central nervous system: ALert Extremities: no edema    Data Reviewed: I have personally reviewed following labs and imaging studies  CBC: Recent Labs  Lab 03/30/23 0213 03/31/23 0556 04/01/23 0706 04/02/23 0632  WBC 22.4* 21.1* 14.0* 12.6*  NEUTROABS 19.8* 18.3* 11.2*  --   HGB 13.8 11.7* 12.2 12.6  HCT 40.3 35.7* 36.5 39.0  MCV 95.3 98.1 95.1 94.4  PLT 199 171 183 220   Basic Metabolic Panel: Recent Labs  Lab 03/30/23 0213 03/31/23 0556 04/01/23 0706 04/02/23 0632  NA 130* 136 137 140  K 3.1* 3.1* 4.5 3.3*  CL 97* 108 112* 111  CO2 21* 20* 17* 20*  GLUCOSE 94 95 88 88  BUN 23 18 15 11   CREATININE 0.79 0.61 0.64 0.51  CALCIUM  8.7* 7.9* 7.4* 7.6*  MG 1.9  --  2.3 2.0   GFR: Estimated Creatinine Clearance: 41.8 mL/min (by C-G formula based on SCr of 0.51 mg/dL). Liver Function Tests: Recent Labs  Lab 03/30/23 0213 03/31/23 0556  AST 16 25  ALT 9 12  ALKPHOS 93 87  BILITOT 0.9 0.7  PROT 6.4* 5.0*  ALBUMIN 3.0* 2.2*   No results for input(s): LIPASE, AMYLASE in the last 168 hours. No results for input(s): AMMONIA in the last 168 hours. Coagulation Profile: Recent Labs  Lab 03/30/23 0213  INR 1.5*   Cardiac Enzymes: No results for input(s): CKTOTAL, CKMB, CKMBINDEX, TROPONINI in the last 168 hours. BNP (last 3 results) No results for input(s): PROBNP in the last 8760 hours. HbA1C: No results for input(s):  HGBA1C in the last 72 hours. CBG: No results for input(s): GLUCAP in the last 168 hours. Lipid Profile: No results for input(s): CHOL, HDL, LDLCALC, TRIG, CHOLHDL, LDLDIRECT in the last 72 hours. Thyroid Function Tests: No results for input(s): TSH, T4TOTAL, FREET4, T3FREE, THYROIDAB in the last 72 hours.  Anemia Panel: Recent Labs    04/01/23 1004  VITAMINB12 3,305*   Sepsis Labs: Recent Labs  Lab 03/30/23 0449  LATICACIDVEN 1.4    Recent Results (from the past 240 hours)  Blood Culture (routine x 2)     Status: Abnormal   Collection Time: 03/30/23  1:11 AM   Specimen: BLOOD LEFT ARM  Result Value Ref Range Status   Specimen Description   Final    BLOOD LEFT ARM Performed at [facility], [address] [place], [place] [address]    Special Requests   Final    BOTTLES DRAWN AEROBIC AND ANAEROBIC Blood Culture adequate volume Performed at [facility], [address] [place],  [place] [address]    Culture  Setup Time   Final    GRAM NEGATIVE RODS IN BOTH AEROBIC AND ANAEROBIC BOTTLES CRITICAL VALUE NOTED.  VALUE IS CONSISTENT WITH PREVIOUSLY REPORTED AND CALLED VALUE.    Culture (A)  Final    ESCHERICHIA COLI SUSCEPTIBILITIES PERFORMED ON PREVIOUS CULTURE WITHIN THE LAST 5 DAYS. Performed at [facility], [address] [place], [place] [address]    Report Status 04/01/2023 FINAL  Final  Resp panel by RT-PCR (RSV, Flu A&B, Covid) Anterior Nasal Swab     Status: None   Collection Time: 03/30/23  1:11 AM   Specimen: Anterior Nasal Swab  Result Value Ref Range Status   SARS Coronavirus 2 by RT PCR NEGATIVE NEGATIVE Final    Comment: (NOTE) SARS-CoV-2 target nucleic acids are NOT DETECTED.  The SARS-CoV-2 RNA is generally detectable in upper respiratory specimens during the acute phase of infection. The lowest concentration of SARS-CoV-2 viral copies this assay can detect is 138 copies/mL. A negative result  does not preclude SARS-Cov-2 infection and should not be used as the sole basis for treatment or other patient management decisions. A negative result may occur with  improper specimen collection/handling, submission of specimen other than nasopharyngeal swab, presence of viral mutation(s) within the areas targeted by this assay, and inadequate number of viral copies(<138 copies/mL). A negative result must be combined with clinical observations, patient history, and epidemiological information. The expected result is Negative.  Fact Sheet for Patients:  bloggercourse.com  Fact Sheet for Healthcare Providers:  seriousbroker.it  This test is no t yet approved or cleared by the [place]  FDA and  has been authorized for detection and/or diagnosis of SARS-CoV-2 by FDA under an Emergency Use Authorization (EUA). This EUA will remain  in effect (meaning this test can be used) for the duration of the COVID-19 declaration under Section 564(b)(1) of the Act, 21 U.S.C.section 360bbb-3(b)(1), unless the authorization is terminated  or revoked sooner.       Influenza A by PCR NEGATIVE NEGATIVE Final   Influenza B by PCR NEGATIVE NEGATIVE Final    Comment: (NOTE) The Xpert Xpress SARS-CoV-2/FLU/RSV plus assay is intended as an aid in the diagnosis of influenza from Nasopharyngeal swab specimens and should not be used as a sole basis for treatment. Nasal washings and aspirates are unacceptable for Xpert Xpress SARS-CoV-2/FLU/RSV testing.  Fact Sheet for Patients: bloggercourse.com  Fact Sheet for Healthcare Providers: seriousbroker.it  This test is not yet approved or cleared by the [place]  FDA and has been authorized for detection and/or diagnosis of SARS-CoV-2 by FDA under an Emergency Use Authorization (EUA). This EUA will remain in effect (meaning this test can be used) for  the duration of the COVID-19 declaration under Section 564(b)(1) of the Act, 21 U.S.C. section 360bbb-3(b)(1), unless the authorization is terminated or revoked.     Resp Syncytial Virus by PCR NEGATIVE NEGATIVE Final    Comment: (NOTE) Fact Sheet for Patients: bloggercourse.com  Fact Sheet for Healthcare Providers: seriousbroker.it  This test is not yet approved or cleared by the [place]  FDA and has been authorized for detection and/or diagnosis of SARS-CoV-2 by FDA under an Emergency Use Authorization (EUA). This EUA will remain in effect (meaning this test can be used) for the duration of the COVID-19 declaration under Section 564(b)(1) of the Act, 21 U.S.C. section 360bbb-3(b)(1), unless the authorization is terminated or revoked.  Performed at [facility], [address] [place], [place]  [address]   Urine Culture     Status: Abnormal   Collection Time: 03/30/23  1:21 AM   Specimen: Urine, Random  Result Value Ref Range Status   Specimen Description   Final    URINE, RANDOM Performed at [facility], [address], [place], [place] [address]    Special Requests   Final    NONE Reflexed from [id number] Performed at [facility], [address], [place], [place] [address]    Culture (A)  Final    >=100,000 COLONIES/mL ESCHERICHIA COLI Confirmed Extended Spectrum Beta-Lactamase Producer (ESBL).  In bloodstream infections from ESBL organisms, carbapenems are preferred over piperacillin/tazobactam. They are shown to have a lower risk of mortality. Two isolates with different morphologies were identified as the same organism.The most resistant organism was reported. Performed at [facility], [address], [place], [place] [address]    Report Status 04/01/2023 FINAL  Final   Organism ID, Bacteria ESCHERICHIA COLI (A)  Final      Susceptibility    Escherichia coli - MIC*    AMPICILLIN  >=32 RESISTANT Resistant     CEFAZOLIN  >=64 RESISTANT Resistant     CEFEPIME  >=32 RESISTANT Resistant     CEFTRIAXONE  >=64 RESISTANT Resistant     CIPROFLOXACIN  >=4 RESISTANT Resistant     GENTAMICIN >=16 RESISTANT Resistant     IMIPENEM <=0.25 SENSITIVE Sensitive     NITROFURANTOIN 128 RESISTANT Resistant     TRIMETH /SULFA  <=20 SENSITIVE Sensitive     AMPICILLIN /SULBACTAM >=32 RESISTANT Resistant     PIP/TAZO >=128 RESISTANT Resistant ug/mL    * >=100,000 COLONIES/mL ESCHERICHIA COLI  Blood Culture (routine x 2)     Status: Abnormal (Preliminary result)   Collection Time: 03/30/23  3:26 AM   Specimen: BLOOD RIGHT ARM  Result Value Ref Range Status   Specimen Description   Final    BLOOD RIGHT ARM Performed at [facility], [address] [place], [place] [address]    Special Requests   Final    BOTTLES DRAWN AEROBIC AND ANAEROBIC Blood Culture adequate volume Performed at [facility], [address], [place], [place] [address]    Culture  Setup Time   Final    GRAM NEGATIVE RODS IN BOTH AEROBIC AND ANAEROBIC BOTTLES CRITICAL RESULT CALLED TO, READ BACK BY AND VERIFIED WITH: PHARMD ANH PHAM ON 03/30/23 @ 1818 BY DRT Performed at [facility], [address], [place], [place] [address]    Culture (A)  Final    ESCHERICHIA COLI Confirmed Extended Spectrum Beta-Lactamase Producer (ESBL).  In bloodstream infections from ESBL organisms, carbapenems are preferred over piperacillin/tazobactam. They are shown to have a lower risk of mortality.    Report Status PENDING  Incomplete   Organism ID, Bacteria ESCHERICHIA COLI  Final      Susceptibility   Escherichia coli - MIC*    AMPICILLIN  >=32 RESISTANT Resistant     CEFEPIME  >=32 RESISTANT Resistant     CEFTAZIDIME RESISTANT Resistant     CEFTRIAXONE  >=64 RESISTANT Resistant     CIPROFLOXACIN  >=4 RESISTANT Resistant     GENTAMICIN >=16 RESISTANT Resistant      IMIPENEM <=0.25 SENSITIVE Sensitive     TRIMETH /SULFA  <=20 SENSITIVE Sensitive     AMPICILLIN /SULBACTAM >=32 RESISTANT Resistant     PIP/TAZO >=128 RESISTANT Resistant ug/mL    * ESCHERICHIA COLI  Blood Culture ID Panel (Reflexed)     Status: Abnormal   Collection Time:  03/30/23  3:26 AM  Result Value Ref Range Status   Enterococcus faecalis NOT DETECTED NOT DETECTED Final   Enterococcus Faecium NOT DETECTED NOT DETECTED Final   Listeria monocytogenes NOT DETECTED NOT DETECTED Final   Staphylococcus species NOT DETECTED NOT DETECTED Final   Staphylococcus aureus (BCID) NOT DETECTED NOT DETECTED Final   Staphylococcus epidermidis NOT DETECTED NOT DETECTED Final   Staphylococcus lugdunensis NOT DETECTED NOT DETECTED Final   Streptococcus species NOT DETECTED NOT DETECTED Final   Streptococcus agalactiae NOT DETECTED NOT DETECTED Final   Streptococcus pneumoniae NOT DETECTED NOT DETECTED Final   Streptococcus pyogenes NOT DETECTED NOT DETECTED Final   A.calcoaceticus-baumannii NOT DETECTED NOT DETECTED Final   Bacteroides fragilis NOT DETECTED NOT DETECTED Final   Enterobacterales DETECTED (A) NOT DETECTED Final    Comment: Enterobacterales represent a large order of gram negative bacteria, not a single organism. CRITICAL RESULT CALLED TO, READ BACK BY AND VERIFIED WITH: PHARMD ANH PHAM ON 03/30/23 @ 1818 BY DRT    Enterobacter cloacae complex NOT DETECTED NOT DETECTED Final   Escherichia coli DETECTED (A) NOT DETECTED Final    Comment: CRITICAL RESULT CALLED TO, READ BACK BY AND VERIFIED WITH: PHARMD ANH PHAM ON 03/30/23 @ 1818 BY DRT    Klebsiella aerogenes NOT DETECTED NOT DETECTED Final   Klebsiella oxytoca NOT DETECTED NOT DETECTED Final   Klebsiella pneumoniae NOT DETECTED NOT DETECTED Final   Proteus species NOT DETECTED NOT DETECTED Final   Salmonella species NOT DETECTED NOT DETECTED Final   Serratia marcescens NOT DETECTED NOT DETECTED Final   Haemophilus influenzae NOT  DETECTED NOT DETECTED Final   Neisseria meningitidis NOT DETECTED NOT DETECTED Final   Pseudomonas aeruginosa NOT DETECTED NOT DETECTED Final   Stenotrophomonas maltophilia NOT DETECTED NOT DETECTED Final   Candida albicans NOT DETECTED NOT DETECTED Final   Candida auris NOT DETECTED NOT DETECTED Final   Candida glabrata NOT DETECTED NOT DETECTED Final   Candida krusei NOT DETECTED NOT DETECTED Final   Candida parapsilosis NOT DETECTED NOT DETECTED Final   Candida tropicalis NOT DETECTED NOT DETECTED Final   Cryptococcus neoformans/gattii NOT DETECTED NOT DETECTED Final   CTX-M ESBL DETECTED (A) NOT DETECTED Final    Comment: CRITICAL RESULT CALLED TO, READ BACK BY AND VERIFIED WITH: PHARMD ANH PHAM ON 03/30/23 @ 1818 BY DRT (NOTE) Extended spectrum beta-lactamase detected. Recommend a carbapenem as initial therapy.      Carbapenem resistance IMP NOT DETECTED NOT DETECTED Final   Carbapenem resistance KPC NOT DETECTED NOT DETECTED Final   Carbapenem resistance NDM NOT DETECTED NOT DETECTED Final   Carbapenem resist OXA 48 LIKE NOT DETECTED NOT DETECTED Final   Carbapenem resistance VIM NOT DETECTED NOT DETECTED Final    Comment: Performed at [facility], [address], [place], [place] [address]  Culture, blood (Routine X 2) w Reflex to ID Panel     Status: None (Preliminary result)   Collection Time: 04/01/23  9:06 AM   Specimen: BLOOD RIGHT ARM  Result Value Ref Range Status   Specimen Description   Final    BLOOD RIGHT ARM Performed at [facility], [address] [place], [place] [address]    Special Requests   Final    BOTTLES DRAWN AEROBIC AND ANAEROBIC Blood Culture results may not be optimal due to an inadequate volume of blood received in culture bottles Performed at [facility], [address] [place], [place] [address]    Culture  Final    NO GROWTH < 24 HOURS Performed at [facility], [address], [place], [place]  [address]    Report Status PENDING  Incomplete  Culture, blood (Routine X 2) w Reflex to ID Panel     Status: None (Preliminary result)   Collection Time: 04/01/23  9:06 AM   Specimen: BLOOD LEFT ARM  Result Value Ref Range Status   Specimen Description   Final    BLOOD LEFT ARM Performed at [facility], [address] [place], [place] [address]    Special Requests   Final    BOTTLES DRAWN AEROBIC AND ANAEROBIC Blood Culture results may not be optimal due to an inadequate volume of blood received in culture bottles Performed at [facility], [address], [place], [place] [address]    Culture   Final    NO GROWTH < 24 HOURS Performed at [facility], [address], [place], [place] [address]    Report Status PENDING  Incomplete         Radiology Studies: MR BRAIN WO CONTRAST Addendum Date: 04/02/2023 ADDENDUM REPORT: 04/02/2023 12:13 ADDENDUM: The report was inadvertently finalized before images were available. FINDINGS: Brain: No acute infarct, hemorrhage, or mass lesion is present. Moderate atrophy and white matter disease is present bilaterally. The ventricles are proportionate to the degree of atrophy. No significant extraaxial fluid collection is present. Deep brain nuclei are within normal limits. Remote lacunar infarcts are present in the cerebellum bilaterally, right greater than left. The brainstem and cerebellum are otherwise within normal limits. The internal auditory canals are within normal limits. Midline structures are within normal limits. Vascular: Flow is present in the major intracranial arteries. Skull and upper cervical spine: The craniocervical junction is normal. Upper cervical spine is within normal limits. Marrow signal is unremarkable. Sinuses/orbits: The paranasal sinuses and mastoid air cells are clear. Bilateral lens replacements are noted. Globes and orbits are otherwise unremarkable. Impressions: 1. Stable appearance of  moderate atrophy and diffuse white matter disease likely reflects the sequela of chronic microvascular ischemia. 2. No acute intracranial abnormality. Electronically Signed   By: Lonni Necessary M.D.   On: 04/02/2023 12:13   Result Date: 04/02/2023 CLINICAL DATA:  Altered mental status. Confusion. EXAM: MRI HEAD WITHOUT CONTRAST TECHNIQUE: Multiplanar, multiecho pulse sequences of the brain and surrounding structures were obtained without intravenous contrast. COMPARISON:  None Available. FINDINGS: Brain: Vascular: Skull and upper cervical spine: Sinuses/Orbits: Other: Electronically Signed: By: Lonni Necessary M.D. On: 04/02/2023 11:49   CT RENAL STONE STUDY Result Date: 03/31/2023 CLINICAL DATA:  Abdominal/flank pain, stone suspected EXAM: CT ABDOMEN AND PELVIS WITHOUT CONTRAST TECHNIQUE: Multidetector CT imaging of the abdomen and pelvis was performed following the standard protocol without IV contrast. RADIATION DOSE REDUCTION: This exam was performed according to the departmental dose-optimization program which includes automated exposure control, adjustment of the mA and/or kV according to patient size and/or use of iterative reconstruction technique. COMPARISON:  04/18/2022 FINDINGS: Lower chest: Stable hiatal hernia. Chronic bilateral pleural effusions and dependent lower lobe atelectasis, left greater than right. Hepatobiliary: Unremarkable unenhanced appearance of the liver and gallbladder. No biliary duct dilation. Pancreas: Extensive fatty infiltration of the pancreas. No acute inflammatory changes or pancreatic duct dilation. Spleen: Unremarkable unenhanced appearance. Adrenals/Urinary Tract: Stable nonobstructing 3 mm calculus lower pole left kidney. No right-sided calculi. Stable simple right renal cyst does not require specific imaging follow-up. Stable appearance of the adrenals. Bladder is decompressed, limiting  its evaluation. Stomach/Bowel: No bowel obstruction or ileus. Moderate  stool throughout the colon consistent with constipation. No bowel wall thickening or inflammatory change. Hiatal hernia. Vascular/Lymphatic: Aortic atherosclerosis. No enlarged abdominal or pelvic lymph nodes. Reproductive: Prior hysterectomy.  No adnexal masses. Other: Trace pelvic free fluid, nonspecific. No free intraperitoneal gas. Small fat containing umbilical hernia. Small left inguinal hernia containing fat and trace fluid. No bowel herniation. Musculoskeletal: Stable right hip arthroplasty and left hip ORIF. Chronic healed fractures of the bilateral iliac bones, inferior pubic rami, and sacral ala. Chronic compression deformities of the superior endplates at L4 and L5. No acute bony abnormalities. Reconstructed images demonstrate no additional findings. IMPRESSION: 1. Nonobstructing 3 mm left renal calculus. 2. Moderate retained stool throughout the colon consistent with constipation. No bowel obstruction or ileus. 3. Small fat containing umbilical and left inguinal hernias, stable. No bowel herniation. 4. Stable hiatal hernia. 5. Chronic bilateral pleural effusions and dependent lower lobe atelectasis, left greater than right, unchanged since previous exam. 6.  Aortic Atherosclerosis (ICD10-I70.0). Electronically Signed   By: Ozell Daring M.D.   On: 03/31/2023 19:50        Scheduled Meds:  apixaban   2.5 mg Oral BID   Budeson-Glycopyrrol-Formoterol   2 puff Inhalation BID   calcium  carbonate  2 tablet Oral BID   feeding supplement  237 mL Oral BID BM   ferrous sulfate   325 mg Oral QODAY   guaiFENesin   1,200 mg Oral BID   levothyroxine   100 mcg Oral Daily   melatonin  5 mg Oral QHS   methenamine   1,000 mg Oral BID   oxybutynin   5 mg Oral Daily   pantoprazole   40 mg Oral Daily   polyvinyl alcohol   1 drop Both Eyes BID   potassium chloride   40 mEq Oral Once   saccharomyces boulardii  250 mg Oral BID   sertraline   50 mg Oral QHS   sodium bicarbonate   650 mg Oral TID   Continuous  Infusions:  lactated ringers  40 mL/hr at 04/02/23 1246   meropenem  (MERREM ) IV 1 g (04/02/23 0931)     LOS: 3 days    Time spent: 35 minutes    Kenslei Hearty A Paula Busenbark, MD [facility]   If 7PM-7AM, please contact night-coverage [email or web address]  04/02/2023, 1:57 PM

## 2023-04-02 NOTE — Plan of Care (Signed)
  Problem: Pain Managment: Goal: General experience of comfort will improve and/or be controlled Outcome: Progressing   Problem: Safety: Goal: Ability to remain free from injury will improve Outcome: Progressing   Problem: Skin Integrity: Goal: Risk for impaired skin integrity will decrease Outcome: Progressing

## 2023-04-03 ENCOUNTER — Inpatient Hospital Stay (HOSPITAL_COMMUNITY): Payer: Medicare Other

## 2023-04-03 DIAGNOSIS — A419 Sepsis, unspecified organism: Secondary | ICD-10-CM | POA: Diagnosis not present

## 2023-04-03 DIAGNOSIS — N390 Urinary tract infection, site not specified: Secondary | ICD-10-CM | POA: Diagnosis not present

## 2023-04-03 LAB — BLOOD GAS, ARTERIAL
Acid-Base Excess: 1 mmol/L (ref 0.0–2.0)
Bicarbonate: 23.1 mmol/L (ref 20.0–28.0)
Drawn by: 31394
O2 Saturation: 99.4 %
Patient temperature: 36.4
pCO2 arterial: 28 mm[Hg] — ABNORMAL LOW (ref 32–48)
pH, Arterial: 7.52 — ABNORMAL HIGH (ref 7.35–7.45)
pO2, Arterial: 76 mm[Hg] — ABNORMAL LOW (ref 83–108)

## 2023-04-03 LAB — CBC
HCT: 41.4 % (ref 36.0–46.0)
Hemoglobin: 13.5 g/dL (ref 12.0–15.0)
MCH: 30.8 pg (ref 26.0–34.0)
MCHC: 32.6 g/dL (ref 30.0–36.0)
MCV: 94.3 fL (ref 80.0–100.0)
Platelets: 256 10*3/uL (ref 150–400)
RBC: 4.39 MIL/uL (ref 3.87–5.11)
RDW: 15.3 % (ref 11.5–15.5)
WBC: 15.9 10*3/uL — ABNORMAL HIGH (ref 4.0–10.5)
nRBC: 0 % (ref 0.0–0.2)

## 2023-04-03 LAB — BASIC METABOLIC PANEL
Anion gap: 7 (ref 5–15)
BUN: 10 mg/dL (ref 8–23)
CO2: 21 mmol/L — ABNORMAL LOW (ref 22–32)
Calcium: 8 mg/dL — ABNORMAL LOW (ref 8.9–10.3)
Chloride: 110 mmol/L (ref 98–111)
Creatinine, Ser: 0.46 mg/dL (ref 0.44–1.00)
GFR, Estimated: >60 mL/min (ref >60)
Glucose, Bld: 127 mg/dL — ABNORMAL HIGH (ref 70–99)
Potassium: 3.8 mmol/L (ref 3.5–5.1)
Sodium: 138 mmol/L (ref 135–145)

## 2023-04-03 LAB — AMMONIA: Ammonia: 19 umol/L (ref 9–35)

## 2023-04-03 LAB — BRAIN NATRIURETIC PEPTIDE: B Natriuretic Peptide: 572.4 pg/mL — ABNORMAL HIGH (ref 0.0–100.0)

## 2023-04-03 LAB — GLUCOSE, CAPILLARY: Glucose-Capillary: 116 mg/dL — ABNORMAL HIGH (ref 70–99)

## 2023-04-03 MED ORDER — FLEET ENEMA RE ENEM
1.0000 | ENEMA | Freq: Once | RECTAL | Status: AC
  Administered 2023-04-03: 1 via RECTAL
  Filled 2023-04-03: qty 1

## 2023-04-03 MED ORDER — POTASSIUM CHLORIDE CRYS ER 20 MEQ PO TBCR
40.0000 meq | EXTENDED_RELEASE_TABLET | Freq: Once | ORAL | Status: AC
  Administered 2023-04-03: 40 meq via ORAL
  Filled 2023-04-03: qty 2

## 2023-04-03 MED ORDER — THIAMINE HCL 100 MG/ML IJ SOLN
500.0000 mg | Freq: Every day | INTRAVENOUS | Status: DC
  Administered 2023-04-03 – 2023-04-06 (×4): 500 mg via INTRAVENOUS
  Filled 2023-04-03 (×4): qty 5

## 2023-04-03 MED ORDER — ENSURE ENLIVE PO LIQD
237.0000 mL | Freq: Three times a day (TID) | ORAL | Status: DC
  Administered 2023-04-03 – 2023-04-07 (×12): 237 mL via ORAL

## 2023-04-03 MED ORDER — FUROSEMIDE 10 MG/ML IJ SOLN
40.0000 mg | Freq: Once | INTRAMUSCULAR | Status: AC
  Administered 2023-04-03: 40 mg via INTRAVENOUS
  Filled 2023-04-03: qty 4

## 2023-04-03 NOTE — Evaluation (Signed)
 Occupational Therapy Evaluation Patient Details Name: [name] MRN: [id number] DOB: [birth year] Today's Date: 04/03/2023   History of Present Illness Patient is a 89 year old female who presented from [facility]TC SNF with recent UTI diagnosis with failure to improve. Patient was admitted with sepsis secondary to UTI, acute metabolic encephalopathy, Failure to thrive. PMH: anemia, AKI, aortic atherosclerosis, asthma, CAD, carotid stenosis, L hip fx, inferior pubic ramus fx, R eye macular degeneration.   Clinical Impression   Patient is a 89 year old female who was admitted for above. Patient was at wheelchair level with caregiver support for ADLs with 1 person assist for transfers. Currently, patient was significantly limited with dizziness with sitting EOB with patient returned to supine within 10 seconds. Unable to obtain orthostatics on this date.  Patient was noted to have decreased functional activity tolernace, decreased ROM, decreased BUE strength, decreased endurance, decreased sitting balance, decreased standing balanced, decreased safety awareness, and decreased knowledge of AE/AD impacting participation in ADLs. Patient would continue to benefit from skilled OT services at this time while admitted and after d/c to address noted deficits in order to improve overall safety and independence in ADLs. Patient will benefit from continued inpatient follow up therapy, <3 hours/day.         If plan is discharge home, recommend the following: Two people to help with walking and/or transfers;A lot of help with bathing/dressing/bathroom;Assistance with cooking/housework;Direct supervision/assist for medications management;Assist for transportation;Help with stairs or ramp for entrance;Direct supervision/assist for financial management;Assistance with feeding;Supervision due to cognitive status    Functional Status Assessment  Patient has had a recent decline in their functional status and demonstrates  the ability to make significant improvements in function in a reasonable and predictable amount of time.  Equipment Recommendations  None recommended by OT       Precautions / Restrictions Precautions Precautions: Fall Restrictions Weight Bearing Restrictions Per Provider Order: No      Mobility Bed Mobility Overal bed mobility: Needs Assistance Bed Mobility: Supine to Sit, Sit to Supine     Supine to sit: Max assist Sit to supine: Max assist                 ADL either performed or assessed with clinical judgement   ADL Overall ADL's : Needs assistance/impaired Eating/Feeding: Supervision/ safety;Bed level Eating/Feeding Details (indicate cue type and reason): educated on drinking more fluids with UTI. Grooming: Bed level;Minimal assistance   Upper Body Bathing: Bed level;Moderate assistance   Lower Body Bathing: Bed level;Total assistance   Upper Body Dressing : Bed level;Moderate assistance   Lower Body Dressing: Bed level;Total assistance     Toilet Transfer Details (indicate cue type and reason): unable to toelrate EOB for over 10 seconds returned to bed. Toileting- Clothing Manipulation and Hygiene: Bed level;Total assistance Toileting - Clothing Manipulation Details (indicate cue type and reason): rolling to replace linens and clean bottom after smear of stool and soled linens noted upon entry to room             Vision Baseline Vision/History: 6 Macular Degeneration (in R Eye)              Pertinent Vitals/Pain Pain Assessment Pain Assessment: Faces Faces Pain Scale: Hurts little more Pain Location: R knee Pain Descriptors / Indicators: Grimacing Pain Intervention(s): Limited activity within patient's tolerance, Monitored during session     Extremity/Trunk Assessment Upper Extremity Assessment Upper Extremity Assessment: Generalized weakness   Lower Extremity Assessment Lower Extremity Assessment: Defer  to PT evaluation   Cervical /  Trunk Assessment Cervical / Trunk Assessment: Kyphotic   Communication Communication Communication: Hearing impairment   Cognition Arousal: Alert Behavior During Therapy: Flat affect Overall Cognitive Status: Impaired/Different from baseline           General Comments: patient asking multiple times what day it was and why she was in the hospial. daughter present at end of session. patient plesant and cooperative                Home Living Family/patient expects to be discharged to:: Skilled nursing facility         Home Equipment: Agricultural consultant (2 wheels);BSC/3in1;Shower seat          Prior Functioning/Environment Prior Level of Function : Needs assist             Mobility Comments: w/c level ADLs Comments: daughter reported having 1x from staff for transfers. patient is able to participate in UB ADLs but has staff support for LB ADLs.        OT Problem List: Impaired balance (sitting and/or standing);Decreased safety awareness;Impaired UE functional use;Cardiopulmonary status limiting activity;Decreased knowledge of use of DME or AE;Decreased activity tolerance;Pain      OT Treatment/Interventions: Self-care/ADL training;DME and/or AE instruction;Therapeutic activities;Balance training;Therapeutic exercise;Patient/family education    OT Goals(Current goals can be found in the care plan section) Acute Rehab OT Goals Patient Stated Goal: to get back home OT Goal Formulation: With patient/family Time For Goal Achievement: 04/17/23 Potential to Achieve Goals: Fair  OT Frequency: Min 1X/week       AM-PAC OT "6 Clicks" Daily Activity     Outcome Measure Help from another person eating meals?: A Little Help from another person taking care of personal grooming?: A Little Help from another person toileting, which includes using toliet, bedpan, or urinal?: Total Help from another person bathing (including washing, rinsing, drying)?: Total Help from another  person to put on and taking off regular upper body clothing?: A Lot Help from another person to put on and taking off regular lower body clothing?: Total 6 Click Score: 11   End of Session Nurse Communication: Other (comment) (daughters request to speak with nurse, patiens dry mouth, and need for new pure wic)  Activity Tolerance: Patient limited by pain;Other (comment) (dizziness) Patient left: in bed;with call bell/phone within reach;with family/visitor present;with bed alarm set  OT Visit Diagnosis: Unsteadiness on feet (R26.81);Other abnormalities of gait and mobility (R26.89);Pain;Muscle weakness (generalized) (M62.81);Dizziness and giddiness (R42) Pain - Right/Left: Right Pain - part of body: Knee                Time: 2130-8657 OT Time Calculation (min): 29 min Charges:  OT General Charges $OT Visit: 1 Visit OT Evaluation $OT Eval Low Complexity: 1 Low OT Treatments $Self Care/Home Management : 8-22 mins  Wynette Heckler, MS Acute Rehabilitation Department Office# [phone]   Jame Maze 04/03/2023, 9:33 AM

## 2023-04-03 NOTE — Plan of Care (Signed)
  Problem: Fluid Volume: Goal: Hemodynamic stability will improve Outcome: Progressing   Problem: Clinical Measurements: Goal: Diagnostic test results will improve Outcome: Progressing Goal: Signs and symptoms of infection will decrease Outcome: Progressing   Problem: Respiratory: Goal: Ability to maintain adequate ventilation will improve Outcome: Progressing   Problem: Education: Goal: Knowledge of General Education information will improve Description: Including pain rating scale, medication(s)/side effects and non-pharmacologic comfort measures Outcome: Progressing   Problem: Health Behavior/Discharge Planning: Goal: Ability to manage health-related needs will improve Outcome: Progressing   Problem: Clinical Measurements: Goal: Ability to maintain clinical measurements within normal limits will improve Outcome: Progressing Goal: Will remain free from infection Outcome: Progressing Goal: Diagnostic test results will improve Outcome: Progressing Goal: Respiratory complications will improve Outcome: Progressing   Problem: Coping: Goal: Level of anxiety will decrease Outcome: Progressing   Problem: Elimination: Goal: Will not experience complications related to bowel motility Outcome: Progressing Goal: Will not experience complications related to urinary retention Outcome: Progressing   Problem: Pain Managment: Goal: General experience of comfort will improve and/or be controlled Outcome: Progressing   Problem: Safety: Goal: Ability to remain free from injury will improve Outcome: Progressing   Problem: Skin Integrity: Goal: Risk for impaired skin integrity will decrease Outcome: Progressing   Problem: Activity: Goal: Risk for activity intolerance will decrease Outcome: Not Progressing   Problem: Nutrition: Goal: Adequate nutrition will be maintained Outcome: Not Progressing

## 2023-04-03 NOTE — Evaluation (Signed)
 Physical Therapy Evaluation Patient Details Name: [name] MRN: [id number] DOB: 04-Nov-1934 Today's Date: 04/03/2023  History of Present Illness  Patient is a 89 year old female who presented from LTC SNF with recent UTI diagnosis with failure to improve. Patient was admitted with sepsis secondary to UTI, acute metabolic encephalopathy, Failure to thrive. PMH: anemia, AKI, aortic atherosclerosis, asthma, CAD, carotid stenosis, L hip fx, inferior pubic ramus fx, R eye macular degeneration.  Clinical Impression  On eval, pt required Max A for mobility on today. She sat EOB for ~ 3 minutes-limited by weakness and fatigue. Assisted pt back to bed. BP 130/78, O2 98%, HR 80 bpm. Discussed d/c plan with daughter-plan is for pt to return to LTC facility. Patient will benefit from a trial of continued inpatient follow up therapy, <3 hours/day. Will plan to follow pt during this hospital stay.       If plan is discharge home, recommend the following: Two people to help with walking and/or transfers;A lot of help with bathing/dressing/bathroom   Can travel by private vehicle   No    Equipment Recommendations None recommended by PT  Recommendations for Other Services       Functional Status Assessment Patient has had a recent decline in their functional status and demonstrates the ability to make significant improvements in function in a reasonable and predictable amount of time.     Precautions / Restrictions Precautions Precautions: Fall Restrictions Weight Bearing Restrictions Per Provider Order: No      Mobility  Bed Mobility Overal bed mobility: Needs Assistance Bed Mobility: Supine to Sit, Sit to Sidelying     Supine to sit: Max assist   Sit to sidelying: Mod assist General bed mobility comments: Assist for trunk and bil LEs. Utilized bedpad to assist with positioning at EOB. Increased time. Cues provided. Pt sat EOB for ~ 3 minutes with Min A for balance. Fatigues very  easily. Weak.    Transfers                        Ambulation/Gait                  Stairs            Wheelchair Mobility     Tilt Bed    Modified Rankin (Stroke Patients Only)       Balance Overall balance assessment: Needs assistance Sitting-balance support: Feet supported, Bilateral upper extremity supported Sitting balance-Leahy Scale: Poor                                       Pertinent Vitals/Pain Pain Assessment Pain Assessment: Faces Faces Pain Scale: No hurt Pain Intervention(s): Limited activity within patient's tolerance, Monitored during session, Repositioned    Home Living Family/patient expects to be discharged to:: Skilled nursing facility                 Home Equipment: Agricultural consultant (2 wheels);BSC/3in1;Shower seat      Prior Function Prior Level of Function : Needs assist             Mobility Comments: bed<>transfers wtih assistance of 1-2, depending on staff preference per daughter ADLs Comments: daughter reported having 1x from staff for transfers. patient is able to participate in UB ADLs but has staff support for LB ADLs.     Extremity/Trunk Assessment   Upper  Extremity Assessment Upper Extremity Assessment: Defer to OT evaluation    Lower Extremity Assessment Lower Extremity Assessment: Generalized weakness    Cervical / Trunk Assessment Cervical / Trunk Assessment: Kyphotic  Communication   Communication Communication: Hearing impairment Cueing Techniques: Verbal cues;Tactile cues  Cognition Arousal: Alert Behavior During Therapy: Flat affect   Area of Impairment: Problem solving                             Problem Solving: Requires verbal cues          General Comments      Exercises     Assessment/Plan    PT Assessment Patient needs continued PT services  PT Problem List Decreased strength;Decreased balance;Decreased activity tolerance;Decreased  mobility;Decreased knowledge of use of DME;Decreased range of motion       PT Treatment Interventions DME instruction;Gait training;Therapeutic activities;Therapeutic exercise;Balance training;Patient/family education;Functional mobility training    PT Goals (Current goals can be found in the Care Plan section)  Acute Rehab PT Goals Patient Stated Goal: none stated. daughter would like a trial of rehab at LTC facility if possible PT Goal Formulation: With patient/family Time For Goal Achievement: 04/17/23 Potential to Achieve Goals: Fair    Frequency Min 1X/week     Co-evaluation               AM-PAC PT "6 Clicks" Mobility  Outcome Measure Help needed turning from your back to your side while in a flat bed without using bedrails?: A Lot Help needed moving from lying on your back to sitting on the side of a flat bed without using bedrails?: A Lot Help needed moving to and from a bed to a chair (including a wheelchair)?: Total Help needed standing up from a chair using your arms (e.g., wheelchair or bedside chair)?: Total Help needed to walk in hospital room?: Total Help needed climbing 3-5 steps with a railing? : Total 6 Click Score: 8    End of Session   Activity Tolerance: Patient limited by fatigue Patient left: in bed;with call bell/phone within reach;with family/visitor present;with bed alarm set   PT Visit Diagnosis: Muscle weakness (generalized) (M62.81);Other abnormalities of gait and mobility (R26.89)    Time: 1610-9604 PT Time Calculation (min) (ACUTE ONLY): 21 min   Charges:   PT Evaluation $PT Eval Low Complexity: 1 Low   PT General Charges $$ ACUTE PT VISIT: 1 Visit            Tanda Falter, PT Acute Rehabilitation  Office: [phone]

## 2023-04-03 NOTE — Progress Notes (Signed)
 PROGRESS NOTE    [name]  ZOX:[id number] DOB: 16-Feb-1935 DOA: 03/29/2023 PCP: Tita Form, MD   Brief Narrative: 89 year old with past medical history significant for anemia post menorrhagia, AKI, aortic atherosclerosis, asthma, CAD, carotid stenosis, history of left hip fracture, inferior pubic ramus fracture, hyperlipidemia, hydronephrosis, right eye macular degeneration, paroxysmal ventricular tachycardia, pneumonia, sarcoidosis, [place] recurrent UTI who was recently started on antibiotics for UTI at her facility, but failed to improve.  Continue to develop generalized weakness.  History of resistant bacteria.  Presented with recurrent UTI, UA with greater than 50 white blood cell, leukocytosis white blood cell 22.   Assessment & Plan:   Principal Problem:   Sepsis secondary to UTI [facility]) Active Problems:   HLD (hyperlipidemia)   Malnutrition of moderate degree   Hypokalemia   Prolonged QT interval   Hyponatremia   1-Sepsis secondary to UTI -Patient presented with fever temperature 101, tachycardia heart rate 100, leukocytosis.  In the setting  of infection UTI, UA with more than 50 white blood cell.  Blood cultures positive, growing gram-negative rods. -Previous ESBL E. coli -Continue with meropenem  -Urine Culture; growing E coli. ESBL -CT kidney.  nonobstructing 3 mm renal calculus, moderate retained stool throughout the colon consistent with constipation.  Chronic bilateral pleural effusion -WBC at 15.  -ID consulted.  Acute Metabolic Encephalopathy Appears more confuse, daughter report patient have been more confuse.  Likely in setting of infection.  MRI negative for Stroke.  Plan to check ABG today, showed respiratory alkalosis, Hypoxia. Ammonia 19 Placed on Oxygen.   FTT; weakness, poor oral intake.  In setting of infection.  Hold IV fluids today, BNP elevated, was having dyspnea this am.   Constipation:  Miralax , senna No significant BM Will order Fleet  enema.  Suspect nausea from constipation.   E. coli bacteremia: -Follow final blood cultures result pending -Continue with meropenem  -2/08 Blood culture: No growth to date -ID consulted.   Metabolic acidosis: Started  sodium bicarb tablet  Hypokalemia: -Replaced  Prolonged QT: Receive IV magnesium  Follow EKG  Hyponatremia:  Improved with fluids.  In setting hypovolemia.   Chronic diastolic heart failure Will give a dose of IV lasix .  Order Chest x ray   COPD, history of sarcoid Continue with  Budeson, Glycopyrrol, formeterol.  Duoneb  History of paroxysmal A-fib: Continue with Eliquis  Amiodarone  is not on med list anymore  Malnutrition of moderate degree: Started  Supplement  Hypothyroidism: Continue with Synthroid  TSH normal  Chronic bilateral pleural effusion  See wound care documentation below  Pressure Injury 07/13/22 Heel Right Stage 1 -  Intact skin with non-blanchable redness of a localized area usually over a bony prominence. 1 cm x 1 cm (Active)  07/13/22   Location: Heel  Location Orientation: Right  Staging: Stage 1 -  Intact skin with non-blanchable redness of a localized area usually over a bony prominence.  Wound Description (Comments): 1 cm x 1 cm  Present on Admission: Yes     Estimated body mass index is 21.09 kg/m as calculated from the following:   Height as of this encounter: 5\' 3"  (1.6 m).   Weight as of this encounter: 54 kg.   DVT prophylaxis: eliquis  Code Status: DNR Family Communication: Daughter 2/10 Disposition Plan:  Status is: Inpatient Remains inpatient appropriate because: management of  infection     Consultants:  None  Procedures:  none  Antimicrobials:    Subjective: Patent appears very weak. Not eating much. Sleepy on my assessment after  she got compazine .  Not eating much per daughter.  Also was complaining of Dyspnea.   Objective: Vitals:   04/02/23 1345 04/02/23 2141 04/03/23 0652 04/03/23 1145   BP: 118/76 (!) 147/71 (!) 169/81   Pulse: 99 71 70   Resp:  18 18   Temp: 97.9 F (36.6 C) 98.9 F (37.2 C) 97.8 F (36.6 C) 97.6 F (36.4 C)  TempSrc: Oral   Oral  SpO2: 97% 99% 98%   Weight:      Height:        Intake/Output Summary (Last 24 hours) at 04/03/2023 1318 Last data filed at 04/03/2023 1610 Gross per 24 hour  Intake 824 ml  Output 500 ml  Net 324 ml   Filed Weights   03/31/23 0259  Weight: 54 kg    Examination:  General exam: Sleepy Respiratory system:  BL air movement Cardiovascular system: S 1, S 2 RRR Gastrointestinal system:  BS present, soft nt Central nervous system: Alert Extremities: no edema   Data Reviewed: I have personally reviewed following labs and imaging studies  CBC: Recent Labs  Lab 03/30/23 0213 03/31/23 0556 04/01/23 0706 04/02/23 0632 04/03/23 0600  WBC 22.4* 21.1* 14.0* 12.6* 15.9*  NEUTROABS 19.8* 18.3* 11.2*  --   --   HGB 13.8 11.7* 12.2 12.6 13.5  HCT 40.3 35.7* 36.5 39.0 41.4  MCV 95.3 98.1 95.1 94.4 94.3  PLT 199 171 183 220 256   Basic Metabolic Panel: Recent Labs  Lab 03/30/23 0213 03/31/23 0556 04/01/23 0706 04/02/23 0632 04/03/23 0600  NA 130* 136 137 140 138  K 3.1* 3.1* 4.5 3.3* 3.8  CL 97* 108 112* 111 110  CO2 21* 20* 17* 20* 21*  GLUCOSE 94 95 88 88 127*  BUN 23 18 15 11 10   CREATININE 0.79 0.61 0.64 0.51 0.46  CALCIUM  8.7* 7.9* 7.4* 7.6* 8.0*  MG 1.9  --  2.3 2.0  --    GFR: Estimated Creatinine Clearance: 41.8 mL/min (by C-G formula based on SCr of 0.46 mg/dL). Liver Function Tests: Recent Labs  Lab 03/30/23 0213 03/31/23 0556  AST 16 25  ALT 9 12  ALKPHOS 93 87  BILITOT 0.9 0.7  PROT 6.4* 5.0*  ALBUMIN 3.0* 2.2*   No results for input(s): "LIPASE", "AMYLASE" in the last 168 hours. Recent Labs  Lab 04/03/23 1134  AMMONIA 19   Coagulation Profile: Recent Labs  Lab 03/30/23 0213  INR 1.5*   Cardiac Enzymes: No results for input(s): "CKTOTAL", "CKMB", "CKMBINDEX",  "TROPONINI" in the last 168 hours. BNP (last 3 results) No results for input(s): "PROBNP" in the last 8760 hours. HbA1C: No results for input(s): "HGBA1C" in the last 72 hours. CBG: Recent Labs  Lab 04/03/23 1140  GLUCAP 116*   Lipid Profile: No results for input(s): "CHOL", "HDL", "LDLCALC", "TRIG", "CHOLHDL", "LDLDIRECT" in the last 72 hours. Thyroid Function Tests: No results for input(s): "TSH", "T4TOTAL", "FREET4", "T3FREE", "THYROIDAB" in the last 72 hours.  Anemia Panel: Recent Labs    04/01/23 1004  VITAMINB12 3,305*   Sepsis Labs: Recent Labs  Lab 03/30/23 0449  LATICACIDVEN 1.4    Recent Results (from the past 240 hours)  Blood Culture (routine x 2)     Status: Abnormal   Collection Time: 03/30/23  1:11 AM   Specimen: BLOOD LEFT ARM  Result Value Ref Range Status   Specimen Description   Final    BLOOD LEFT ARM Performed at [facility], [address]  [place], [place] [address]    Special Requests   Final    BOTTLES DRAWN AEROBIC AND ANAEROBIC Blood Culture adequate volume Performed at [facility], [address] [place], [place] [address]    Culture  Setup Time   Final    GRAM NEGATIVE RODS IN BOTH AEROBIC AND ANAEROBIC BOTTLES CRITICAL VALUE NOTED.  VALUE IS CONSISTENT WITH PREVIOUSLY REPORTED AND CALLED VALUE.    Culture (A)  Final    ESCHERICHIA COLI SUSCEPTIBILITIES PERFORMED ON PREVIOUS CULTURE WITHIN THE LAST 5 DAYS. Performed at [facility], [address] [place], [place] [address]    Report Status 04/01/2023 FINAL  Final  Resp panel by RT-PCR (RSV, Flu A&B, Covid) Anterior Nasal Swab     Status: None   Collection Time: 03/30/23  1:11 AM   Specimen: Anterior Nasal Swab  Result Value Ref Range Status   SARS Coronavirus 2 by RT PCR NEGATIVE NEGATIVE Final    Comment: (NOTE) SARS-CoV-2 target nucleic acids are NOT DETECTED.  The SARS-CoV-2 RNA is generally detectable in upper respiratory specimens  during the acute phase of infection. The lowest concentration of SARS-CoV-2 viral copies this assay can detect is 138 copies/mL. A negative result does not preclude SARS-Cov-2 infection and should not be used as the sole basis for treatment or other patient management decisions. A negative result may occur with  improper specimen collection/handling, submission of specimen other than nasopharyngeal swab, presence of viral mutation(s) within the areas targeted by this assay, and inadequate number of viral copies(<138 copies/mL). A negative result must be combined with clinical observations, patient history, and epidemiological information. The expected result is Negative.  Fact Sheet for Patients:  BloggerCourse.com  Fact Sheet for Healthcare Providers:  SeriousBroker.it  This test is no t yet approved or cleared by the [place]  FDA and  has been authorized for detection and/or diagnosis of SARS-CoV-2 by FDA under an Emergency Use Authorization (EUA). This EUA will remain  in effect (meaning this test can be used) for the duration of the COVID-19 declaration under Section 564(b)(1) of the Act, 21 U.S.C.section 360bbb-3(b)(1), unless the authorization is terminated  or revoked sooner.       Influenza A by PCR NEGATIVE NEGATIVE Final   Influenza B by PCR NEGATIVE NEGATIVE Final    Comment: (NOTE) The Xpert Xpress SARS-CoV-2/FLU/RSV plus assay is intended as an aid in the diagnosis of influenza from Nasopharyngeal swab specimens and should not be used as a sole basis for treatment. Nasal washings and aspirates are unacceptable for Xpert Xpress SARS-CoV-2/FLU/RSV testing.  Fact Sheet for Patients: BloggerCourse.com  Fact Sheet for Healthcare Providers: SeriousBroker.it  This test is not yet approved or cleared by the [place]  FDA and has been authorized for detection  and/or diagnosis of SARS-CoV-2 by FDA under an Emergency Use Authorization (EUA). This EUA will remain in effect (meaning this test can be used) for the duration of the COVID-19 declaration under Section 564(b)(1) of the Act, 21 U.S.C. section 360bbb-3(b)(1), unless the authorization is terminated or revoked.     Resp Syncytial Virus by PCR NEGATIVE NEGATIVE Final    Comment: (NOTE) Fact Sheet for Patients: BloggerCourse.com  Fact Sheet for Healthcare Providers: SeriousBroker.it  This test is not yet approved or cleared by the [place]  FDA and has been authorized for detection and/or diagnosis of SARS-CoV-2 by FDA under an Emergency Use Authorization (EUA). This EUA will remain in effect (meaning this test can be used) for the duration  of the COVID-19 declaration under Section 564(b)(1) of the Act, 21 U.S.C. section 360bbb-3(b)(1), unless the authorization is terminated or revoked.  Performed at [facility], [address], [place], [place] [address]   Urine Culture     Status: Abnormal   Collection Time: 03/30/23  1:21 AM   Specimen: Urine, Random  Result Value Ref Range Status   Specimen Description   Final    URINE, RANDOM Performed at [facility], [address], [place], [place] [address]    Special Requests   Final    NONE Reflexed from [id number] Performed at [facility], [address], [place], [place] [address]    Culture (A)  Final    >=100,000 COLONIES/mL ESCHERICHIA COLI Confirmed Extended Spectrum Beta-Lactamase Producer (ESBL).  In bloodstream infections from ESBL organisms, carbapenems are preferred over piperacillin/tazobactam. They are shown to have a lower risk of mortality. Two isolates with different morphologies were identified as the same organism.The most resistant organism was reported. Performed at [facility], [address]  [place], [place] [address]    Report Status 04/01/2023 FINAL  Final   Organism ID, Bacteria ESCHERICHIA COLI (A)  Final      Susceptibility   Escherichia coli - MIC*    AMPICILLIN  >=32 RESISTANT Resistant     CEFAZOLIN  >=64 RESISTANT Resistant     CEFEPIME >=32 RESISTANT Resistant     CEFTRIAXONE  >=64 RESISTANT Resistant     CIPROFLOXACIN  >=4 RESISTANT Resistant     GENTAMICIN >=16 RESISTANT Resistant     IMIPENEM <=0.25 SENSITIVE Sensitive     NITROFURANTOIN 128 RESISTANT Resistant     TRIMETH /SULFA  <=20 SENSITIVE Sensitive     AMPICILLIN /SULBACTAM >=32 RESISTANT Resistant     PIP/TAZO >=128 RESISTANT Resistant ug/mL    * >=100,000 COLONIES/mL ESCHERICHIA COLI  Blood Culture (routine x 2)     Status: Abnormal   Collection Time: 03/30/23  3:26 AM   Specimen: BLOOD RIGHT ARM  Result Value Ref Range Status   Specimen Description   Final    BLOOD RIGHT ARM Performed at [facility], [address] [place], [place] [address]    Special Requests   Final    BOTTLES DRAWN AEROBIC AND ANAEROBIC Blood Culture adequate volume Performed at [facility], [address], [place], [place] [address]    Culture  Setup Time   Final    GRAM NEGATIVE RODS IN BOTH AEROBIC AND ANAEROBIC BOTTLES CRITICAL RESULT CALLED TO, READ BACK BY AND VERIFIED WITH: PHARMD ANH PHAM ON 03/30/23 @ 1818 BY DRT Performed at [facility], [address] [place], [place] [address]    Culture (A)  Final    ESCHERICHIA COLI Confirmed Extended Spectrum Beta-Lactamase Producer (ESBL).  In bloodstream infections from ESBL organisms, carbapenems are preferred over piperacillin/tazobactam. They are shown to have a lower risk of mortality.    Report Status 04/02/2023 FINAL  Final   Organism ID, Bacteria ESCHERICHIA COLI  Final      Susceptibility   Escherichia coli - MIC*    AMPICILLIN  >=32 RESISTANT Resistant     CEFEPIME >=32 RESISTANT Resistant     CEFTAZIDIME RESISTANT Resistant      CEFTRIAXONE  >=64 RESISTANT Resistant     CIPROFLOXACIN  >=4 RESISTANT Resistant     GENTAMICIN >=16 RESISTANT Resistant     IMIPENEM <=0.25 SENSITIVE Sensitive     TRIMETH /SULFA  <=20 SENSITIVE Sensitive     AMPICILLIN /SULBACTAM >=32  RESISTANT Resistant     PIP/TAZO >=128 RESISTANT Resistant ug/mL    * ESCHERICHIA COLI  Blood Culture ID Panel (Reflexed)     Status: Abnormal   Collection Time: 03/30/23  3:26 AM  Result Value Ref Range Status   Enterococcus faecalis NOT DETECTED NOT DETECTED Final   Enterococcus Faecium NOT DETECTED NOT DETECTED Final   Listeria monocytogenes NOT DETECTED NOT DETECTED Final   Staphylococcus species NOT DETECTED NOT DETECTED Final   Staphylococcus aureus (BCID) NOT DETECTED NOT DETECTED Final   Staphylococcus epidermidis NOT DETECTED NOT DETECTED Final   Staphylococcus lugdunensis NOT DETECTED NOT DETECTED Final   Streptococcus species NOT DETECTED NOT DETECTED Final   Streptococcus agalactiae NOT DETECTED NOT DETECTED Final   Streptococcus pneumoniae NOT DETECTED NOT DETECTED Final   Streptococcus pyogenes NOT DETECTED NOT DETECTED Final   A.calcoaceticus-baumannii NOT DETECTED NOT DETECTED Final   Bacteroides fragilis NOT DETECTED NOT DETECTED Final   Enterobacterales DETECTED (A) NOT DETECTED Final    Comment: Enterobacterales represent a large order of gram negative bacteria, not a single organism. CRITICAL RESULT CALLED TO, READ BACK BY AND VERIFIED WITH: PHARMD ANH PHAM ON 03/30/23 @ 1818 BY DRT    Enterobacter cloacae complex NOT DETECTED NOT DETECTED Final   Escherichia coli DETECTED (A) NOT DETECTED Final    Comment: CRITICAL RESULT CALLED TO, READ BACK BY AND VERIFIED WITH: PHARMD ANH PHAM ON 03/30/23 @ 1818 BY DRT    Klebsiella aerogenes NOT DETECTED NOT DETECTED Final   Klebsiella oxytoca NOT DETECTED NOT DETECTED Final   Klebsiella pneumoniae NOT DETECTED NOT DETECTED Final   Proteus species NOT DETECTED NOT DETECTED Final   Salmonella  species NOT DETECTED NOT DETECTED Final   Serratia marcescens NOT DETECTED NOT DETECTED Final   Haemophilus influenzae NOT DETECTED NOT DETECTED Final   Neisseria meningitidis NOT DETECTED NOT DETECTED Final   Pseudomonas aeruginosa NOT DETECTED NOT DETECTED Final   Stenotrophomonas maltophilia NOT DETECTED NOT DETECTED Final   Candida albicans NOT DETECTED NOT DETECTED Final   Candida auris NOT DETECTED NOT DETECTED Final   Candida glabrata NOT DETECTED NOT DETECTED Final   Candida krusei NOT DETECTED NOT DETECTED Final   Candida parapsilosis NOT DETECTED NOT DETECTED Final   Candida tropicalis NOT DETECTED NOT DETECTED Final   Cryptococcus neoformans/gattii NOT DETECTED NOT DETECTED Final   CTX-M ESBL DETECTED (A) NOT DETECTED Final    Comment: CRITICAL RESULT CALLED TO, READ BACK BY AND VERIFIED WITH: PHARMD ANH PHAM ON 03/30/23 @ 1818 BY DRT (NOTE) Extended spectrum beta-lactamase detected. Recommend a carbapenem as initial therapy.      Carbapenem resistance IMP NOT DETECTED NOT DETECTED Final   Carbapenem resistance KPC NOT DETECTED NOT DETECTED Final   Carbapenem resistance NDM NOT DETECTED NOT DETECTED Final   Carbapenem resist OXA 48 LIKE NOT DETECTED NOT DETECTED Final   Carbapenem resistance VIM NOT DETECTED NOT DETECTED Final    Comment: Performed at [facility], [address] [place], [place] [address]  Culture, blood (Routine X 2) w Reflex to ID Panel     Status: None (Preliminary result)   Collection Time: 04/01/23  9:06 AM   Specimen: BLOOD RIGHT ARM  Result Value Ref Range Status   Specimen Description   Final    BLOOD RIGHT ARM Performed at [facility], [address], [place], [place] [address]    Special Requests   Final    BOTTLES DRAWN AEROBIC AND ANAEROBIC Blood Culture  results may not be optimal due to an inadequate volume of blood received in culture bottles Performed at [facility], [address], [place], [place]  [address]    Culture   Final    NO GROWTH < 24 HOURS Performed at [facility], [address], [place], [place] [address]    Report Status PENDING  Incomplete  Culture, blood (Routine X 2) w Reflex to ID Panel     Status: None (Preliminary result)   Collection Time: 04/01/23  9:06 AM   Specimen: BLOOD LEFT ARM  Result Value Ref Range Status   Specimen Description   Final    BLOOD LEFT ARM Performed at [facility], [address] [place], [place] [address]    Special Requests   Final    BOTTLES DRAWN AEROBIC AND ANAEROBIC Blood Culture results may not be optimal due to an inadequate volume of blood received in culture bottles Performed at [facility], [address] [place], [place] [address]    Culture   Final    NO GROWTH < 24 HOURS Performed at [facility], [address], [place], [place] [address]    Report Status PENDING  Incomplete         Radiology Studies: MR BRAIN WO CONTRAST Addendum Date: 04/02/2023 ADDENDUM REPORT: 04/02/2023 12:13 ADDENDUM: The report was inadvertently finalized before images were available. FINDINGS: Brain: No acute infarct, hemorrhage, or mass lesion is present. Moderate atrophy and white matter disease is present bilaterally. The ventricles are proportionate to the degree of atrophy. No significant extraaxial fluid collection is present. Deep brain nuclei are within normal limits. Remote lacunar infarcts are present in the cerebellum bilaterally, right greater than left. The brainstem and cerebellum are otherwise within normal limits. The internal auditory canals are within normal limits. Midline structures are within normal limits. Vascular: Flow is present in the major intracranial arteries. Skull and upper cervical spine: The craniocervical junction is normal. Upper cervical spine is within normal limits. Marrow signal is unremarkable. Sinuses/orbits: The paranasal sinuses and mastoid air cells are clear.  Bilateral lens replacements are noted. Globes and orbits are otherwise unremarkable. Impressions: 1. Stable appearance of moderate atrophy and diffuse white matter disease likely reflects the sequela of chronic microvascular ischemia. 2. No acute intracranial abnormality. Electronically Signed   By: Audree Leas M.D.   On: 04/02/2023 12:13   Result Date: 04/02/2023 CLINICAL DATA:  Altered mental status. Confusion. EXAM: MRI HEAD WITHOUT CONTRAST TECHNIQUE: Multiplanar, multiecho pulse sequences of the brain and surrounding structures were obtained without intravenous contrast. COMPARISON:  None Available. FINDINGS: Brain: Vascular: Skull and upper cervical spine: Sinuses/Orbits: Other: Electronically Signed: By: Audree Leas M.D. On: 04/02/2023 11:49        Scheduled Meds:  apixaban   2.5 mg Oral BID   Budeson-Glycopyrrol-Formoterol   2 puff Inhalation BID   calcium  carbonate  2 tablet Oral BID   feeding supplement  237 mL Oral BID BM   ferrous sulfate   325 mg Oral QODAY   guaiFENesin   1,200 mg Oral BID   levothyroxine   100 mcg Oral Daily   melatonin  5 mg Oral QHS   methenamine   1,000 mg Oral BID   oxybutynin   5 mg Oral Daily   pantoprazole   40 mg Oral Daily   polyethylene glycol  17 g Oral BID   polyvinyl alcohol   1 drop Both Eyes BID   saccharomyces boulardii  250 mg Oral BID  senna-docusate  1 tablet Oral BID   sertraline   50 mg Oral QHS   sodium bicarbonate   650 mg Oral TID   sodium phosphate   1 enema Rectal Once   Continuous Infusions:  meropenem  (MERREM ) IV 1 g (04/03/23 1157)   thiamine  (VITAMIN B1) injection       LOS: 4 days    Time spent: 35 minutes    Aaleeyah Bias A Edee Nifong, MD [facility]   If 7PM-7AM, please contact night-coverage [email or web address]  04/03/2023, 1:18 PM

## 2023-04-04 DIAGNOSIS — N390 Urinary tract infection, site not specified: Secondary | ICD-10-CM | POA: Diagnosis not present

## 2023-04-04 DIAGNOSIS — A419 Sepsis, unspecified organism: Secondary | ICD-10-CM | POA: Diagnosis not present

## 2023-04-04 LAB — CBC
HCT: 42.1 % (ref 36.0–46.0)
Hemoglobin: 13.3 g/dL (ref 12.0–15.0)
MCH: 30.9 pg (ref 26.0–34.0)
MCHC: 31.6 g/dL (ref 30.0–36.0)
MCV: 97.7 fL (ref 80.0–100.0)
Platelets: 279 10*3/uL (ref 150–400)
RBC: 4.31 MIL/uL (ref 3.87–5.11)
RDW: 15.6 % — ABNORMAL HIGH (ref 11.5–15.5)
WBC: 14.5 10*3/uL — ABNORMAL HIGH (ref 4.0–10.5)
nRBC: 0 % (ref 0.0–0.2)

## 2023-04-04 LAB — HEPATITIS A ANTIBODY, TOTAL: hep A Total Ab: NONREACTIVE

## 2023-04-04 LAB — HEPATITIS C ANTIBODY: HCV Ab: NONREACTIVE

## 2023-04-04 LAB — BASIC METABOLIC PANEL
Anion gap: 10 (ref 5–15)
BUN: 9 mg/dL (ref 8–23)
CO2: 22 mmol/L (ref 22–32)
Calcium: 7.8 mg/dL — ABNORMAL LOW (ref 8.9–10.3)
Chloride: 104 mmol/L (ref 98–111)
Creatinine, Ser: 0.52 mg/dL (ref 0.44–1.00)
GFR, Estimated: >60 mL/min (ref >60)
Glucose, Bld: 95 mg/dL (ref 70–99)
Potassium: 3.9 mmol/L (ref 3.5–5.1)
Sodium: 136 mmol/L (ref 135–145)

## 2023-04-04 LAB — HEPATITIS B SURFACE ANTIGEN: Hepatitis B Surface Ag: NONREACTIVE

## 2023-04-04 MED ORDER — BISACODYL 5 MG PO TBEC: 5.0000 mg | DELAYED_RELEASE_TABLET | Freq: Every day | ORAL | Status: DC | PRN

## 2023-04-04 MED ORDER — ONDANSETRON HCL 4 MG PO TABS
4.0000 mg | ORAL_TABLET | Freq: Three times a day (TID) | ORAL | Status: AC
  Administered 2023-04-04 (×3): 4 mg via ORAL
  Filled 2023-04-04 (×3): qty 1

## 2023-04-04 NOTE — Plan of Care (Signed)
Problem: Safety: Goal: Ability to remain free from injury will improve Outcome: Progressing   Problem: Skin Integrity: Goal: Risk for impaired skin integrity will decrease Outcome: Progressing   Problem: Coping: Goal: Level of anxiety will decrease Outcome: Progressing   Problem: Nutrition: Goal: Adequate nutrition will be maintained Outcome: Progressing

## 2023-04-04 NOTE — Progress Notes (Signed)
[facility]  Date of Admission:  03/29/2023     Principal Problem:   Sepsis secondary to UTI [facility]) Active Problems:   HLD (hyperlipidemia)   Malnutrition of moderate degree   Hypokalemia   Prolonged QT interval   Hyponatremia          Assessment: 89 year old female admitted with: #ESBL E. coli secondary to UTI # History of ESBL E. coli UTI, followed by urology - Blood cultures and urine cultures on admission grew ESBL E. coli crispatus sensitivities - Patient has been on meropenem since admission - CT renal study from 2/7 showed nonobstructive mineral calculus,, pleural effusions.  MRI brain on 2/9 showed no acute abnormality - ID engaged for antibiotic recommendations Plan: -Can transition to Bactrim on discharge to treat UTI for about 10 days total EOT 2/15.  Patient has been on meropenem since 2/6(7 days complete to morrow 2/12) - Discussed with patient's daughter given history of Bactrim allergy.  She states that PCP told her patient had hepatitis C due to Bactrim. Serology noted to be HCV ab negative on 2/10 -HAV non-immune->vaccinate -HBV c and s AB pending   ID will sign off  Microbiology:   Antibiotics:  Merrem 2/6- Cultures: Blood 2/6 2/2 esbl ecoli Urine 2/6 esbl ecoli Other   SUBJECTIVE: Resting in bed.  Interval: afebrile overnight. Wbc 14.5  Review of Systems: Review of Systems  All other systems reviewed and are negative.    Scheduled Meds:  apixaban  2.5 mg Oral BID   Budeson-Glycopyrrol-Formoterol  2 puff Inhalation BID   calcium carbonate  2 tablet Oral BID   feeding supplement  237 mL Oral TID BM   ferrous sulfate  325 mg Oral QODAY   guaiFENesin  1,200 mg Oral BID   levothyroxine  100 mcg Oral Daily   melatonin  5 mg Oral QHS   methenamine  1,000 mg Oral BID   ondansetron  4 mg Oral TID   oxybutynin  5 mg Oral Daily   pantoprazole  40 mg Oral Daily   polyethylene glycol  17 g Oral BID    polyvinyl alcohol  1 drop Both Eyes BID   saccharomyces boulardii  250 mg Oral BID   senna-docusate  1 tablet Oral BID   sertraline  50 mg Oral QHS   sodium bicarbonate  650 mg Oral TID   Continuous Infusions:  meropenem (MERREM) IV 1 g (04/04/23 1049)   thiamine (VITAMIN B1) injection 500 mg (04/04/23 1137)   PRN Meds:.acetaminophen **OR** acetaminophen, antiseptic oral rinse, bisacodyl, ipratropium-albuterol, prochlorperazine Allergies  Allergen Reactions   Levaquin [Levofloxacin] Other (See Comments)    Joint pain.. Per doctor, told to not to take again and "allergic," per [facility]   Oxycodone Nausea And Vomiting and Other (See Comments)    Can take oxycodone with Zofran (otherwise, GI Intolerance)   Oxycodone-Acetaminophen Other (See Comments)    GI Intolerance and "talking out of my head" and "allergic," per [facility]   Sulfonamide Derivatives Other (See Comments)    Possibly caused hepatitis and "allergic," per [facility]   Sulfa Antibiotics Other (See Comments)    Possibly caused hepatitis and "allergic," per [facility]   Morphine Nausea Only and Other (See Comments)    GI intolerance and "allergic," per [facility]    OBJECTIVE: Vitals:   04/03/23 1145 04/03/23 1347 04/03/23 1943 04/04/23 0500  BP:  (!) 133/57 (!) 157/78 (!) 145/75  Pulse:  74 77 75  Resp:  15 15 16   Temp: 97.6 F (36.4 C) 98.4 F (36.9 C) 98.3 F (36.8 C) 98.3 F (36.8 C)  TempSrc: Oral   Oral  SpO2:  100% 100% 98%  Weight:      Height:       Body mass index is 21.09 kg/m.  Physical Exam Constitutional:      Appearance: Normal appearance.  HENT:     Head: Normocephalic and atraumatic.     Right Ear: Tympanic membrane normal.     Left Ear: Tympanic membrane normal.     Nose: Nose normal.     Mouth/Throat:     Mouth: Mucous membranes are moist.  Eyes:     Extraocular Movements: Extraocular movements intact.     Conjunctiva/sclera: Conjunctivae normal.     Pupils: Pupils are equal, round, and reactive to light.   Cardiovascular:     Rate and Rhythm: Normal rate and regular rhythm.     Heart sounds: No murmur heard.    No friction rub. No gallop.  Pulmonary:     Effort: Pulmonary effort is normal.     Breath sounds: Normal breath sounds.  Abdominal:     General: Abdomen is flat.     Palpations: Abdomen is soft.  Musculoskeletal:        General: Normal range of motion.  Skin:    General: Skin is warm and dry.  Neurological:     General: No focal deficit present.     Mental Status: She is alert and oriented to person, place, and time.  Psychiatric:        Mood and Affect: Mood normal.       Lab Results Lab Results  Component Value Date   WBC 14.5 (H) 04/04/2023   HGB 13.3 04/04/2023   HCT 42.1 04/04/2023   MCV 97.7 04/04/2023   PLT 279 04/04/2023    Lab Results  Component Value Date   CREATININE 0.52 04/04/2023   BUN 9 04/04/2023   NA 136 04/04/2023   K 3.9 04/04/2023   CL 104 04/04/2023   CO2 22 04/04/2023    Lab Results  Component Value Date   ALT 12 03/31/2023   AST 25 03/31/2023   ALKPHOS 87 03/31/2023   BILITOT 0.7 03/31/2023        Danelle Earthly, MD [facility] 04/04/2023, 2:08 PM  I have personally spent 52 minutes involved in face-to-face and non-face-to-face activities for this patient on the day of the visit. Professional time spent includes the following activities: Preparing to see the patient (review of tests), Obtaining and/or reviewing separately obtained history (admission/discharge record), Performing a medically appropriate examination and/or evaluation , Ordering medications/tests/procedures, referring and communicating with other health care professionals, Documenting clinical information in the EMR, Independently interpreting results (not separately reported), Communicating results to the patient/family/caregiver, Counseling and educating the patient/family/caregiver and Care coordination (not  separately reported).

## 2023-04-04 NOTE — Plan of Care (Signed)

## 2023-04-04 NOTE — Progress Notes (Signed)
PROGRESS NOTE    [name]  XBJ:[id number] DOB: 11-10-1934 DOA: 03/29/2023 PCP: Eloisa Northern, MD   Brief Narrative: 89 year old with past medical history significant for anemia post menorrhagia, AKI, aortic atherosclerosis, asthma, CAD, carotid stenosis, history of left hip fracture, inferior pubic ramus fracture, hyperlipidemia, hydronephrosis, right eye macular degeneration, paroxysmal ventricular tachycardia, pneumonia, sarcoidosis, [place] recurrent UTI who was recently started on antibiotics for UTI at her facility, but failed to improve.  Continue to develop generalized weakness.  History of resistant bacteria.  Presented with recurrent UTI, UA with greater than 50 white blood cell, leukocytosis white blood cell 22.  Patient presented with sepsis secondary to UTI, ESBL E. coli bacteremia.  She was a started on meropenem on admission.  Repeated blood cultures have been negative.  ID has been consulted, recommend transition to Bactrim on discharge for about 10 days total of antibiotics.  Patient continues to have failure to thrive, poor oral intake, having in the setting of constipation and acute illness.  She also was found to be hypoxic by blood gas was placed on oxygen supplementation and received a dose of IV Lasix on 2/10.   Assessment & Plan:   Principal Problem:   Sepsis secondary to UTI [facility]) Active Problems:   HLD (hyperlipidemia)   Malnutrition of moderate degree   Hypokalemia   Prolonged QT interval   Hyponatremia   1-Sepsis secondary to UTI -Patient presented with fever temperature 101, tachycardia heart rate 100, leukocytosis.  In the setting  of infection UTI, UA with more than 50 white blood cell.  Blood cultures positive, growing gram-negative rods. -Previous ESBL E. coli -Continue with meropenem -Urine Culture; growing E coli. ESBL -CT kidney.  nonobstructing 3 mm renal calculus, moderate retained stool throughout the colon consistent with constipation.  Chronic  bilateral pleural effusion -WBC at 15.  -ID consulted. Recommend to transition to Bactrim at discharge to complete 10 days.   Acute Metabolic Encephalopathy Appears more confuse, daughter report patient have been more confuse.  Likely in setting of infection.  MRI negative for Stroke.  ABG showed Hypoxia. , showed respiratory alkalosis, Hypoxia. Ammonia 19 Placed on Oxygen.   FTT; weakness, poor oral intake.  In setting of infection, constipation.  -schedule Zofran for 24 hours, start Nystatin for mild oral thrush.  -continue tx of constipation.   Constipation, Nausea:  Continue with Miralax, senna Had Fleet enema 2/10. She  had Large BM today per nurse.  Dulcolax PRN  E. coli bacteremia: -Follow final blood cultures result pending -Continue with meropenem -2/08 Blood culture: No growth to date -ID consulted. Discharge on Bactrim to complete 10 days.   Metabolic acidosis: Started  sodium bicarb tablet  Hypokalemia: -Replaced  Prolonged QT: Receive IV magnesium Follow EKG  Hyponatremia:  Improved with fluids.  In setting hypovolemia.   Acute Chronic diastolic heart failure IV lasix give 2/10. Chest x ray : Mild HF, elevated BNP No oral intake,. Hold on give her IV lasix today  COPD, history of sarcoid Continue with  Budeson, Glycopyrrol, formeterol.  Duoneb  History of paroxysmal A-fib: Continue with Eliquis Amiodarone is not on med list anymore  Malnutrition of moderate degree: Started  Supplement  Hypothyroidism: Continue with Synthroid TSH normal  Chronic bilateral pleural effusion  See wound care documentation below  Pressure Injury 07/13/22 Heel Right Stage 1 -  Intact skin with non-blanchable redness of a localized area usually over a bony prominence. 1 cm x 1 cm (Active)  07/13/22   Location:  Heel  Location Orientation: Right  Staging: Stage 1 -  Intact skin with non-blanchable redness of a localized area usually over a bony prominence.  Wound  Description (Comments): 1 cm x 1 cm  Present on Admission: Yes     Estimated body mass index is 21.09 kg/m as calculated from the following:   Height as of this encounter: 5\' 3"  (1.6 m).   Weight as of this encounter: 54 kg.   DVT prophylaxis: eliquis Code Status: DNR Family Communication: Daughter 2/11 Disposition Plan:  Status is: Inpatient Remains inpatient appropriate because: management of  infection     Consultants:  None  Procedures:  none  Antimicrobials:    Subjective: She is  more alert today. Had large BM this am.  Still with no oral intake. Report no appetite.    Objective: Vitals:   04/03/23 1145 04/03/23 1347 04/03/23 1943 04/04/23 0500  BP:  (!) 133/57 (!) 157/78 (!) 145/75  Pulse:  74 77 75  Resp:  15 15 16   Temp: 97.6 F (36.4 C) 98.4 F (36.9 C) 98.3 F (36.8 C) 98.3 F (36.8 C)  TempSrc: Oral   Oral  SpO2:  100% 100% 98%  Weight:      Height:        Intake/Output Summary (Last 24 hours) at 04/04/2023 1312 Last data filed at 04/04/2023 0617 Gross per 24 hour  Intake 475 ml  Output 900 ml  Net -425 ml   Filed Weights   03/31/23 0259  Weight: 54 kg    Examination:  General exam: Alert, follows command Respiratory system: BL air movement  Cardiovascular system: S 1, S 2 RRR Gastrointestinal system:  BS present, soft, nt Central nervous system: Alert Extremities: no edema   Data Reviewed: I have personally reviewed following labs and imaging studies  CBC: Recent Labs  Lab 03/30/23 0213 03/31/23 0556 04/01/23 0706 04/02/23 0632 04/03/23 0600 04/04/23 0604  WBC 22.4* 21.1* 14.0* 12.6* 15.9* 14.5*  NEUTROABS 19.8* 18.3* 11.2*  --   --   --   HGB 13.8 11.7* 12.2 12.6 13.5 13.3  HCT 40.3 35.7* 36.5 39.0 41.4 42.1  MCV 95.3 98.1 95.1 94.4 94.3 97.7  PLT 199 171 183 220 256 279   Basic Metabolic Panel: Recent Labs  Lab 03/30/23 0213 03/31/23 0556 04/01/23 0706 04/02/23 0632 04/03/23 0600 04/04/23 0604  NA 130*  136 137 140 138 136  K 3.1* 3.1* 4.5 3.3* 3.8 3.9  CL 97* 108 112* 111 110 104  CO2 21* 20* 17* 20* 21* 22  GLUCOSE 94 95 88 88 127* 95  BUN 23 18 15 11 10 9   CREATININE 0.79 0.61 0.64 0.51 0.46 0.52  CALCIUM 8.7* 7.9* 7.4* 7.6* 8.0* 7.8*  MG 1.9  --  2.3 2.0  --   --    GFR: Estimated Creatinine Clearance: 41.8 mL/min (by C-G formula based on SCr of 0.52 mg/dL). Liver Function Tests: Recent Labs  Lab 03/30/23 0213 03/31/23 0556  AST 16 25  ALT 9 12  ALKPHOS 93 87  BILITOT 0.9 0.7  PROT 6.4* 5.0*  ALBUMIN 3.0* 2.2*   No results for input(s): "LIPASE", "AMYLASE" in the last 168 hours. Recent Labs  Lab 04/03/23 1134  AMMONIA 19   Coagulation Profile: Recent Labs  Lab 03/30/23 0213  INR 1.5*   Cardiac Enzymes: No results for input(s): "CKTOTAL", "CKMB", "CKMBINDEX", "TROPONINI" in the last 168 hours. BNP (last 3 results) No results for input(s): "PROBNP" in the last  8760 hours. HbA1C: No results for input(s): "HGBA1C" in the last 72 hours. CBG: Recent Labs  Lab 04/03/23 1140  GLUCAP 116*   Lipid Profile: No results for input(s): "CHOL", "HDL", "LDLCALC", "TRIG", "CHOLHDL", "LDLDIRECT" in the last 72 hours. Thyroid Function Tests: No results for input(s): "TSH", "T4TOTAL", "FREET4", "T3FREE", "THYROIDAB" in the last 72 hours.  Anemia Panel: No results for input(s): "VITAMINB12", "FOLATE", "FERRITIN", "TIBC", "IRON", "RETICCTPCT" in the last 72 hours.  Sepsis Labs: Recent Labs  Lab 03/30/23 0449  LATICACIDVEN 1.4    Recent Results (from the past 240 hours)  Blood Culture (routine x 2)     Status: Abnormal   Collection Time: 03/30/23  1:11 AM   Specimen: BLOOD LEFT ARM  Result Value Ref Range Status   Specimen Description   Final    BLOOD LEFT ARM Performed at [facility], [address], [place], [place] [address]    Special Requests   Final    BOTTLES DRAWN AEROBIC AND ANAEROBIC Blood Culture adequate volume Performed at [facility], [address], [place], [place] [address]    Culture  Setup Time   Final    GRAM NEGATIVE RODS IN BOTH AEROBIC AND ANAEROBIC BOTTLES CRITICAL VALUE NOTED.  VALUE IS CONSISTENT WITH PREVIOUSLY REPORTED AND CALLED VALUE.    Culture (A)  Final    ESCHERICHIA COLI SUSCEPTIBILITIES PERFORMED ON PREVIOUS CULTURE WITHIN THE LAST 5 DAYS. Performed at [facility], [address] [place], [place] [address]    Report Status 04/01/2023 FINAL  Final  Resp panel by RT-PCR (RSV, Flu A&B, Covid) Anterior Nasal Swab     Status: None   Collection Time: 03/30/23  1:11 AM   Specimen: Anterior Nasal Swab  Result Value Ref Range Status   SARS Coronavirus 2 by RT PCR NEGATIVE NEGATIVE Final    Comment: (NOTE) SARS-CoV-2 target nucleic acids are NOT DETECTED.  The SARS-CoV-2 RNA is generally detectable in upper respiratory specimens during the acute phase of infection. The lowest concentration of SARS-CoV-2 viral copies this assay can detect is 138 copies/mL. A negative result does not preclude SARS-Cov-2 infection and should not be used as the sole basis for treatment or other patient management decisions. A negative result may occur with  improper specimen collection/handling, submission of specimen other than nasopharyngeal swab, presence of viral mutation(s) within the areas targeted by this assay, and inadequate number of viral copies(<138 copies/mL). A negative result must be combined with clinical observations, patient history, and epidemiological information. The expected result is Negative.  Fact Sheet for Patients:  BloggerCourse.com  Fact Sheet for Healthcare Providers:  SeriousBroker.it  This test is no t yet approved or cleared by the [place] FDA and  has been authorized for detection and/or diagnosis of SARS-CoV-2 by FDA under an Emergency Use Authorization (EUA). This EUA will remain  in effect  (meaning this test can be used) for the duration of the COVID-19 declaration under Section 564(b)(1) of the Act, 21 U.S.C.section 360bbb-3(b)(1), unless the authorization is terminated  or revoked sooner.       Influenza A by PCR NEGATIVE NEGATIVE Final   Influenza B by PCR NEGATIVE NEGATIVE Final    Comment: (NOTE) The Xpert Xpress SARS-CoV-2/FLU/RSV plus assay is intended as an aid in the diagnosis of influenza from Nasopharyngeal swab specimens and should not be used as a sole basis for treatment. Nasal washings and aspirates are unacceptable for Xpert Xpress SARS-CoV-2/FLU/RSV testing.  Fact Sheet  for Patients: BloggerCourse.com  Fact Sheet for Healthcare Providers: SeriousBroker.it  This test is not yet approved or cleared by the [place] FDA and has been authorized for detection and/or diagnosis of SARS-CoV-2 by FDA under an Emergency Use Authorization (EUA). This EUA will remain in effect (meaning this test can be used) for the duration of the COVID-19 declaration under Section 564(b)(1) of the Act, 21 U.S.C. section 360bbb-3(b)(1), unless the authorization is terminated or revoked.     Resp Syncytial Virus by PCR NEGATIVE NEGATIVE Final    Comment: (NOTE) Fact Sheet for Patients: BloggerCourse.com  Fact Sheet for Healthcare Providers: SeriousBroker.it  This test is not yet approved or cleared by the [place] FDA and has been authorized for detection and/or diagnosis of SARS-CoV-2 by FDA under an Emergency Use Authorization (EUA). This EUA will remain in effect (meaning this test can be used) for the duration of the COVID-19 declaration under Section 564(b)(1) of the Act, 21 U.S.C. section 360bbb-3(b)(1), unless the authorization is terminated or revoked.  Performed at [facility], [address], [place], [place] [address]   Urine  Culture     Status: Abnormal   Collection Time: 03/30/23  1:21 AM   Specimen: Urine, Random  Result Value Ref Range Status   Specimen Description   Final    URINE, RANDOM Performed at [facility], [address] [place], [place] [address]    Special Requests   Final    NONE Reflexed from [id number] Performed at [facility], [address] [place], [place] [address]    Culture (A)  Final    >=100,000 COLONIES/mL ESCHERICHIA COLI Confirmed Extended Spectrum Beta-Lactamase Producer (ESBL).  In bloodstream infections from ESBL organisms, carbapenems are preferred over piperacillin/tazobactam. They are shown to have a lower risk of mortality. Two isolates with different morphologies were identified as the same organism.The most resistant organism was reported. Performed at [facility], [address], [place], [place] [address]    Report Status 04/01/2023 FINAL  Final   Organism ID, Bacteria ESCHERICHIA COLI (A)  Final      Susceptibility   Escherichia coli - MIC*    AMPICILLIN >=32 RESISTANT Resistant     CEFAZOLIN >=64 RESISTANT Resistant     CEFEPIME >=32 RESISTANT Resistant     CEFTRIAXONE >=64 RESISTANT Resistant     CIPROFLOXACIN >=4 RESISTANT Resistant     GENTAMICIN >=16 RESISTANT Resistant     IMIPENEM <=0.25 SENSITIVE Sensitive     NITROFURANTOIN 128 RESISTANT Resistant     TRIMETH/SULFA <=20 SENSITIVE Sensitive     AMPICILLIN/SULBACTAM >=32 RESISTANT Resistant     PIP/TAZO >=128 RESISTANT Resistant ug/mL    * >=100,000 COLONIES/mL ESCHERICHIA COLI  Blood Culture (routine x 2)     Status: Abnormal   Collection Time: 03/30/23  3:26 AM   Specimen: BLOOD RIGHT ARM  Result Value Ref Range Status   Specimen Description   Final    BLOOD RIGHT ARM Performed at [facility], [address] [place], [place] [address]    Special Requests   Final    BOTTLES DRAWN AEROBIC AND ANAEROBIC Blood Culture adequate volume Performed at  [facility], [address] [place], [place] [address]    Culture  Setup Time   Final    GRAM NEGATIVE RODS IN BOTH AEROBIC AND ANAEROBIC BOTTLES CRITICAL RESULT CALLED TO, READ BACK BY AND VERIFIED WITH: PHARMD ANH PHAM ON 03/30/23 @  1818 BY DRT Performed at [facility], [address] [place], [place] [address]    Culture (A)  Final    ESCHERICHIA COLI Confirmed Extended Spectrum Beta-Lactamase Producer (ESBL).  In bloodstream infections from ESBL organisms, carbapenems are preferred over piperacillin/tazobactam. They are shown to have a lower risk of mortality.    Report Status 04/02/2023 FINAL  Final   Organism ID, Bacteria ESCHERICHIA COLI  Final      Susceptibility   Escherichia coli - MIC*    AMPICILLIN >=32 RESISTANT Resistant     CEFEPIME >=32 RESISTANT Resistant     CEFTAZIDIME RESISTANT Resistant     CEFTRIAXONE >=64 RESISTANT Resistant     CIPROFLOXACIN >=4 RESISTANT Resistant     GENTAMICIN >=16 RESISTANT Resistant     IMIPENEM <=0.25 SENSITIVE Sensitive     TRIMETH/SULFA <=20 SENSITIVE Sensitive     AMPICILLIN/SULBACTAM >=32 RESISTANT Resistant     PIP/TAZO >=128 RESISTANT Resistant ug/mL    * ESCHERICHIA COLI  Blood Culture ID Panel (Reflexed)     Status: Abnormal   Collection Time: 03/30/23  3:26 AM  Result Value Ref Range Status   Enterococcus faecalis NOT DETECTED NOT DETECTED Final   Enterococcus Faecium NOT DETECTED NOT DETECTED Final   Listeria monocytogenes NOT DETECTED NOT DETECTED Final   Staphylococcus species NOT DETECTED NOT DETECTED Final   Staphylococcus aureus (BCID) NOT DETECTED NOT DETECTED Final   Staphylococcus epidermidis NOT DETECTED NOT DETECTED Final   Staphylococcus lugdunensis NOT DETECTED NOT DETECTED Final   Streptococcus species NOT DETECTED NOT DETECTED Final   Streptococcus agalactiae NOT DETECTED NOT DETECTED Final   Streptococcus pneumoniae NOT DETECTED NOT DETECTED Final   Streptococcus pyogenes NOT  DETECTED NOT DETECTED Final   A.calcoaceticus-baumannii NOT DETECTED NOT DETECTED Final   Bacteroides fragilis NOT DETECTED NOT DETECTED Final   Enterobacterales DETECTED (A) NOT DETECTED Final    Comment: Enterobacterales represent a large order of gram negative bacteria, not a single organism. CRITICAL RESULT CALLED TO, READ BACK BY AND VERIFIED WITH: PHARMD ANH PHAM ON 03/30/23 @ 1818 BY DRT    Enterobacter cloacae complex NOT DETECTED NOT DETECTED Final   Escherichia coli DETECTED (A) NOT DETECTED Final    Comment: CRITICAL RESULT CALLED TO, READ BACK BY AND VERIFIED WITH: PHARMD ANH PHAM ON 03/30/23 @ 1818 BY DRT    Klebsiella aerogenes NOT DETECTED NOT DETECTED Final   Klebsiella oxytoca NOT DETECTED NOT DETECTED Final   Klebsiella pneumoniae NOT DETECTED NOT DETECTED Final   Proteus species NOT DETECTED NOT DETECTED Final   Salmonella species NOT DETECTED NOT DETECTED Final   Serratia marcescens NOT DETECTED NOT DETECTED Final   Haemophilus influenzae NOT DETECTED NOT DETECTED Final   Neisseria meningitidis NOT DETECTED NOT DETECTED Final   Pseudomonas aeruginosa NOT DETECTED NOT DETECTED Final   Stenotrophomonas maltophilia NOT DETECTED NOT DETECTED Final   Candida albicans NOT DETECTED NOT DETECTED Final   Candida auris NOT DETECTED NOT DETECTED Final   Candida glabrata NOT DETECTED NOT DETECTED Final   Candida krusei NOT DETECTED NOT DETECTED Final   Candida parapsilosis NOT DETECTED NOT DETECTED Final   Candida tropicalis NOT DETECTED NOT DETECTED Final   Cryptococcus neoformans/gattii NOT DETECTED NOT DETECTED Final   CTX-M ESBL DETECTED (A) NOT DETECTED Final    Comment: CRITICAL RESULT CALLED TO, READ BACK BY AND VERIFIED WITH: PHARMD ANH PHAM ON 03/30/23 @ 1818 BY DRT (NOTE) Extended spectrum beta-lactamase detected. Recommend a carbapenem as initial therapy.  Carbapenem resistance IMP NOT DETECTED NOT DETECTED Final   Carbapenem resistance KPC NOT DETECTED NOT  DETECTED Final   Carbapenem resistance NDM NOT DETECTED NOT DETECTED Final   Carbapenem resist OXA 48 LIKE NOT DETECTED NOT DETECTED Final   Carbapenem resistance VIM NOT DETECTED NOT DETECTED Final    Comment: Performed at [facility], [address] [place], [place] [address]  Culture, blood (Routine X 2) w Reflex to ID Panel     Status: None (Preliminary result)   Collection Time: 04/01/23  9:06 AM   Specimen: BLOOD RIGHT ARM  Result Value Ref Range Status   Specimen Description   Final    BLOOD RIGHT ARM Performed at [facility], [address], [place], [place] [address]    Special Requests   Final    BOTTLES DRAWN AEROBIC AND ANAEROBIC Blood Culture results may not be optimal due to an inadequate volume of blood received in culture bottles Performed at [facility], [address] [place], [place] [address]    Culture   Final    NO GROWTH 3 DAYS Performed at [facility], [address], [place], [place] [address]    Report Status PENDING  Incomplete  Culture, blood (Routine X 2) w Reflex to ID Panel     Status: None (Preliminary result)   Collection Time: 04/01/23  9:06 AM   Specimen: BLOOD LEFT ARM  Result Value Ref Range Status   Specimen Description   Final    BLOOD LEFT ARM Performed at [facility], [address], [place], [place] [address]    Special Requests   Final    BOTTLES DRAWN AEROBIC AND ANAEROBIC Blood Culture results may not be optimal due to an inadequate volume of blood received in culture bottles Performed at [facility], [address] [place], [place] [address]    Culture   Final    NO GROWTH 3 DAYS Performed at [facility], [address], [place], [place] [address]    Report Status PENDING  Incomplete         Radiology Studies: DG CHEST PORT 1 VIEW Result Date: 04/03/2023 CLINICAL DATA:  Shortness of breath. EXAM: PORTABLE CHEST 1 VIEW COMPARISON:  03/30/2023 and CT  chest 04/18/2022. FINDINGS: Patient is rotated. Trachea is midline. Heart is enlarged, stable. Thoracic aorta is calcified. Bibasilar airspace opacification. Probable small bilateral pleural effusions. IMPRESSION: Suspect mild congestive heart failure. Possible bibasilar atelectasis. Electronically Signed   By: Leanna Battles M.D.   On: 04/03/2023 14:12        Scheduled Meds:  apixaban  2.5 mg Oral BID   Budeson-Glycopyrrol-Formoterol  2 puff Inhalation BID   calcium carbonate  2 tablet Oral BID   feeding supplement  237 mL Oral TID BM   ferrous sulfate  325 mg Oral QODAY   guaiFENesin  1,200 mg Oral BID   levothyroxine  100 mcg Oral Daily   melatonin  5 mg Oral QHS   methenamine  1,000 mg Oral BID   ondansetron  4 mg Oral TID   oxybutynin  5 mg Oral Daily   pantoprazole  40 mg Oral Daily   polyethylene glycol  17 g Oral BID   polyvinyl alcohol  1 drop Both Eyes BID   saccharomyces boulardii  250 mg Oral BID   senna-docusate  1 tablet Oral BID   sertraline  50 mg Oral QHS   sodium  bicarbonate  650 mg Oral TID   Continuous Infusions:  meropenem (MERREM) IV 1 g (04/04/23 1049)   thiamine (VITAMIN B1) injection 500 mg (04/04/23 1137)     LOS: 5 days    Time spent: 35 minutes    Zenith Kercheval A Samanda Buske, MD [facility]   If 7PM-7AM, please contact night-coverage [email or web address]  04/04/2023, 1:12 PM

## 2023-04-04 NOTE — Consult Note (Addendum)
[facility]    Date of Admission:  03/29/2023   Total days of inpatient antibiotics 6        Reason for Consult: ESBL ecoli    Principal Problem:   Sepsis secondary to UTI (HCC) Active Problems:   HLD (hyperlipidemia)   Malnutrition of moderate degree   Hypokalemia   Prolonged QT interval   Hyponatremia   Assessment: 89 year old female admitted with: #ESBL E. coli secondary to UTI # History of ESBL E. coli UTI, followed by urology - Blood cultures and urine cultures on admission grew ESBL E. coli crispatus sensitivities - Patient has been on meropenem since admission - CT renal study from 2/7 showed nonobstructive mineral calculus,, pleural effusions.  MRI brain on 2/9 showed no acute abnormality - ID engaged for antibiotic recommendations Plan: -Can transition to Bactrim on discharge to treat UTI for about 10 days total.  Patient has been on meropenem since 2/6. - Discussed with patient's daughter given history of Bactrim allergy.  She states that PCP told her patient had hepatitis C due to Bactrim.  No history of rash, shortness of breath.  Discussed that Bactrim does not cause hepatitis C.  Will get hepatitis serology and hcv dna.    Microbiology:   Antibiotics:  Merrem 2/5- Cultures: Blood 2/6 2/2 esbl ecoli Urine 2/6 esbl ecoli Other   HPI: [name] is a 89 y.o. female  with past medical history of likely sinusitis, posthemorrhagic anemia, AKI, seasonal allergy, aortic atherosclerosis, Baker's cyst, CAD left hip fracture, inferior ramus fracture, frequent UTIs due to E. coli presented with generalized weakness according to patient's daughter.  On arrival patient was febrile.  Blood and urine cultures have grown ESBL E. coli.  She has been on meropenem.  ID engaged for antibiotic recommendations   Review of Systems: Review of Systems  All other systems reviewed and are negative.   Past Medical History:  Diagnosis  Date   Acute maxillary sinusitis 10/17/2017   Acute posthemorrhagic anemia 08/22/2012   Acute renal failure syndrome (HCC) 01/04/2019   Allergy    SEASONAL   Anemia    Aortic atherosclerosis (HCC)    Arthritis    Asthma    "slight"    Baker's cyst of knee, right    CAD (coronary artery disease)    Carotid stenosis    Closed fracture of right olecranon process 05/09/2017   Closed left subtrochanteric femur fracture S/P Open/closed and reduction, internal medullary fixation  09/05/2012   Dyspnea    due to Sarcoidosis. When is windy or humed   Edema 10/30/2019   Elbow fracture 04/2017   Right, had surgery   Esophageal reflux    Fracture of inferior pubic ramus (HCC) 02/28/2019   Hematochezia    Hip fracture (HCC) 12/12/2018   HLD (hyperlipidemia)    Hx of colonoscopy    Hydronephrosis 07/29/2019   Left foot pain 09/02/2014   Assessment: 89 year old with acquired leg length discrepancy on the left who presents with 10 days of acute onset left foot pain. She seemed to be doing well prior to this pain with her orthotics that had been made to correct her leg length discrepancy. Negative x-rays at outside office which were again reviewed today.   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 coli) (HCC) 07/29/2019   Sepsis due to urinary tract infection (HCC) 07/18/2019    Social History   Tobacco Use   Smoking status: Never   Smokeless tobacco: Never  Vaping Use   Vaping status: Never Used  Substance Use Topics   Alcohol use: No   Drug use: No    Family History  Problem Relation Age of Onset   Colon cancer Mother 15   Cancer Mother    Lung cancer  Father    Heart disease Father    Arrhythmia Father    Cancer Father    Cancer Sister        ovarian   Heart attack Brother    Esophageal cancer Neg Hx    Rectal cancer Neg Hx    Stomach cancer Neg Hx    Stroke Neg Hx    Scheduled Meds:  apixaban  2.5 mg Oral BID   Budeson-Glycopyrrol-Formoterol  2 puff Inhalation BID   calcium carbonate  2 tablet Oral BID   feeding supplement  237 mL Oral TID BM   ferrous sulfate  325 mg Oral QODAY   guaiFENesin  1,200 mg Oral BID   levothyroxine  100 mcg Oral Daily   melatonin  5 mg Oral QHS   methenamine  1,000 mg Oral BID   oxybutynin  5 mg Oral Daily   pantoprazole  40 mg Oral Daily   polyethylene glycol  17 g Oral BID   polyvinyl alcohol  1 drop Both Eyes BID   saccharomyces boulardii  250 mg Oral BID   senna-docusate  1 tablet Oral BID   sertraline  50 mg Oral QHS   sodium bicarbonate  650 mg Oral TID   Continuous Infusions:  meropenem (MERREM) IV 1 g (04/03/23 2153)   thiamine (VITAMIN B1) injection Stopped (04/03/23 1446)   PRN Meds:.acetaminophen **OR** acetaminophen, antiseptic oral rinse, ipratropium-albuterol, prochlorperazine Allergies  Allergen Reactions   Levaquin [Levofloxacin] Other (See Comments)    Joint pain.. Per doctor, told to not to take again and "allergic," per [facility]   Oxycodone Nausea And Vomiting and Other (See Comments)    Can take oxycodone with Zofran (otherwise, GI Intolerance)   Oxycodone-Acetaminophen Other (See Comments)    GI Intolerance and "talking out of my head" and "allergic," per [facility]   Sulfonamide Derivatives Other (See Comments)    Possibly caused hepatitis and "allergic," per [facility]   Sulfa Antibiotics Other (See Comments)    Possibly caused hepatitis and "allergic," per [facility]   Morphine Nausea Only and Other (See Comments)    GI intolerance and "allergic," per [facility]    OBJECTIVE: Blood pressure (!) 157/78, pulse 77, temperature 98.3 F (36.8 C), resp. rate 15, height 5\' 3"  (1.6 m), weight 54  kg, last menstrual period 02/22/1983, SpO2 100%.  Physical Exam Constitutional:      Appearance: Normal appearance.  HENT:     Head: Normocephalic and atraumatic.     Right Ear: Tympanic membrane normal.     Left Ear: Tympanic membrane normal.     Nose: Nose normal.     Mouth/Throat:     Mouth: Mucous membranes are moist.  Eyes:     Extraocular Movements: Extraocular movements intact.     Conjunctiva/sclera: Conjunctivae normal.     Pupils:  Pupils are equal, round, and reactive to light.  Cardiovascular:     Rate and Rhythm: Normal rate and regular rhythm.     Heart sounds: No murmur heard.    No friction rub. No gallop.  Pulmonary:     Effort: Pulmonary effort is normal.     Breath sounds: Normal breath sounds.  Abdominal:     General: Abdomen is flat.     Palpations: Abdomen is soft.  Skin:    General: Skin is warm and dry.  Neurological:     General: No focal deficit present.     Mental Status: She is alert and oriented to person, place, and time.  Psychiatric:        Mood and Affect: Mood normal.     Lab Results Lab Results  Component Value Date   WBC 15.9 (H) 04/03/2023   HGB 13.5 04/03/2023   HCT 41.4 04/03/2023   MCV 94.3 04/03/2023   PLT 256 04/03/2023    Lab Results  Component Value Date   CREATININE 0.46 04/03/2023   BUN 10 04/03/2023   NA 138 04/03/2023   K 3.8 04/03/2023   CL 110 04/03/2023   CO2 21 (L) 04/03/2023    Lab Results  Component Value Date   ALT 12 03/31/2023   AST 25 03/31/2023   ALKPHOS 87 03/31/2023   BILITOT 0.7 03/31/2023       Danelle Earthly, MD [facility] 04/04/2023, 5:20 AM I have personally spent 82 minutes involved in face-to-face and non-face-to-face activities for this patient on the day of the visit. Professional time spent includes the following activities: Preparing to see the patient (review of tests), Obtaining and/or reviewing separately obtained history  (admission/discharge record), Performing a medically appropriate examination and/or evaluation , Ordering medications/tests/procedures, referring and communicating with other health care professionals, Documenting clinical information in the EMR, Independently interpreting results (not separately reported), Communicating results to the patient/family/caregiver, Counseling and educating the patient/family/caregiver and Care coordination (not separately reported).      Evaluation of this patient requires complex antimicrobial therapy evaluation and counseling + isolation needs for disease transmission risk assessment and mitigation

## 2023-04-05 DIAGNOSIS — E876 Hypokalemia: Secondary | ICD-10-CM

## 2023-04-05 DIAGNOSIS — E440 Moderate protein-calorie malnutrition: Secondary | ICD-10-CM

## 2023-04-05 DIAGNOSIS — E782 Mixed hyperlipidemia: Secondary | ICD-10-CM | POA: Diagnosis not present

## 2023-04-05 DIAGNOSIS — E871 Hypo-osmolality and hyponatremia: Secondary | ICD-10-CM | POA: Diagnosis not present

## 2023-04-05 DIAGNOSIS — A419 Sepsis, unspecified organism: Secondary | ICD-10-CM | POA: Diagnosis not present

## 2023-04-05 LAB — HIV ANTIBODY (ROUTINE TESTING W REFLEX): HIV Screen 4th Generation wRfx: NONREACTIVE

## 2023-04-05 LAB — BASIC METABOLIC PANEL
Anion gap: 11 (ref 5–15)
BUN: 10 mg/dL (ref 8–23)
CO2: 22 mmol/L (ref 22–32)
Calcium: 8.1 mg/dL — ABNORMAL LOW (ref 8.9–10.3)
Chloride: 105 mmol/L (ref 98–111)
Creatinine, Ser: 0.6 mg/dL (ref 0.44–1.00)
GFR, Estimated: >60 mL/min (ref >60)
Glucose, Bld: 91 mg/dL (ref 70–99)
Potassium: 4 mmol/L (ref 3.5–5.1)
Sodium: 138 mmol/L (ref 135–145)

## 2023-04-05 LAB — CBC
HCT: 39.8 % (ref 36.0–46.0)
Hemoglobin: 13 g/dL (ref 12.0–15.0)
MCH: 31.2 pg (ref 26.0–34.0)
MCHC: 32.7 g/dL (ref 30.0–36.0)
MCV: 95.4 fL (ref 80.0–100.0)
Platelets: 285 10*3/uL (ref 150–400)
RBC: 4.17 MIL/uL (ref 3.87–5.11)
RDW: 15.4 % (ref 11.5–15.5)
WBC: 15 10*3/uL — ABNORMAL HIGH (ref 4.0–10.5)
nRBC: 0 % (ref 0.0–0.2)

## 2023-04-05 LAB — HCV RNA QUANT: HCV Quantitative: NOT DETECTED [IU]/mL (ref >50)

## 2023-04-05 LAB — MAGNESIUM: Magnesium: 1.8 mg/dL (ref 1.7–2.4)

## 2023-04-05 LAB — HEPATITIS B CORE ANTIBODY, TOTAL: HEP B CORE AB: NEGATIVE

## 2023-04-05 LAB — HEPATITIS B SURFACE ANTIBODY, QUANTITATIVE: Hep B S AB Quant (Post): <3.5 m[IU]/mL — ABNORMAL LOW

## 2023-04-05 MED ORDER — ALUM & MAG HYDROXIDE-SIMETH 200-200-20 MG/5ML PO SUSP
15.0000 mL | Freq: Three times a day (TID) | ORAL | Status: AC
  Administered 2023-04-05 – 2023-04-06 (×2): 15 mL via ORAL
  Filled 2023-04-05 (×2): qty 30

## 2023-04-05 MED ORDER — SULFAMETHOXAZOLE-TRIMETHOPRIM 800-160 MG PO TABS
1.0000 | ORAL_TABLET | Freq: Two times a day (BID) | ORAL | Status: DC
  Administered 2023-04-06 – 2023-04-07 (×3): 1 via ORAL
  Filled 2023-04-05 (×4): qty 1

## 2023-04-05 MED ORDER — METOCLOPRAMIDE HCL 5 MG/ML IJ SOLN
5.0000 mg | Freq: Three times a day (TID) | INTRAMUSCULAR | Status: AC
  Administered 2023-04-05 – 2023-04-06 (×3): 5 mg via INTRAVENOUS
  Filled 2023-04-05 (×3): qty 2

## 2023-04-05 MED ORDER — BUDESON-GLYCOPYRROL-FORMOTEROL 160-9-4.8 MCG/ACT IN AERO
2.0000 | INHALATION_SPRAY | Freq: Two times a day (BID) | RESPIRATORY_TRACT | Status: DC
  Administered 2023-04-05 – 2023-04-07 (×5): 2 via RESPIRATORY_TRACT

## 2023-04-05 NOTE — Plan of Care (Signed)
  Problem: Respiratory: Goal: Ability to maintain adequate ventilation will improve Outcome: Progressing   Problem: Education: Goal: Knowledge of General Education information will improve Description: Including pain rating scale, medication(s)/side effects and non-pharmacologic comfort measures Outcome: Progressing   Problem: Activity: Goal: Risk for activity intolerance will decrease Outcome: Progressing   Problem: Nutrition: Goal: Adequate nutrition will be maintained Outcome: Progressing   Problem: Coping: Goal: Level of anxiety will decrease Outcome: Progressing   Problem: Elimination: Goal: Will not experience complications related to urinary retention Outcome: Progressing   Problem: Pain Managment: Goal: General experience of comfort will improve and/or be controlled Outcome: Progressing   Problem: Safety: Goal: Ability to remain free from injury will improve Outcome: Progressing   Problem: Skin Integrity: Goal: Risk for impaired skin integrity will decrease Outcome: Progressing

## 2023-04-05 NOTE — Progress Notes (Signed)
[facility]  PROGRESS NOTE  TYRESHA FEDE WUJ:[id number] DOB: 1934/09/24 DOA: 03/29/2023 PCP: Eloisa Northern, MD   Brief HPI:    89 year old with past medical history significant for anemia post menorrhagia, AKI, aortic atherosclerosis, asthma, CAD, carotid stenosis, history of left hip fracture, inferior pubic ramus fracture, hyperlipidemia, hydronephrosis, right eye macular degeneration, paroxysmal ventricular tachycardia, pneumonia, sarcoidosis, [place] recurrent UTI who was recently started on antibiotics for UTI at her facility, but failed to improve.  Continue to develop generalized weakness.  History of resistant bacteria.  Presented with recurrent UTI, UA with greater than 50 white blood cell, leukocytosis white blood cell 22.   Patient presented with sepsis secondary to UTI, ESBL E. coli bacteremia.  She was a started on meropenem on admission.  Repeated blood cultures have been negative.  ID has been consulted, recommend transition to Bactrim on discharge for about 10 days total of antibiotics.     Assessment/Plan:   #ESBL E. coli secondary to UTI # History of ESBL E. coli UTI, followed by urology - Blood cultures and urine cultures on admission grew ESBL E. coli  - Patient has been on meropenem since admission - CT renal study from 2/7 showed nonobstructive mineral calculus,, pleural effusions.  MRI brain on 2/9 showed no acute abnormality - ID engaged for antibiotic recommendations Plan: -Can transition to Bactrim on discharge to treat UTI for about 10 days total EOT 2/15.  Patient has been on meropenem since 2/6(7 days complete tomorrow 2/12) - Discussed with patient's daughter given history of Bactrim allergy.  She states that PCP told her patient had hepatitis C due to Bactrim. Serology noted to be HCV ab negative on 2/10 -HAV non-immune->vaccinate -HBV c and s AB pending   Anorexia/nausea -Start Reglan 5 mg IV 3 times daily -Maalox 15 mL 3 times daily for 3  doses -Continue pantoprazole 40 mg p.o. daily   Medications     apixaban  2.5 mg Oral BID   Budeson-Glycopyrrol-Formoterol  2 puff Inhalation BID   calcium carbonate  2 tablet Oral BID   feeding supplement  237 mL Oral TID BM   ferrous sulfate  325 mg Oral QODAY   guaiFENesin  1,200 mg Oral BID   levothyroxine  100 mcg Oral Daily   melatonin  5 mg Oral QHS   methenamine  1,000 mg Oral BID   oxybutynin  5 mg Oral Daily   pantoprazole  40 mg Oral Daily   polyethylene glycol  17 g Oral BID   polyvinyl alcohol  1 drop Both Eyes BID   saccharomyces boulardii  250 mg Oral BID   senna-docusate  1 tablet Oral BID   sertraline  50 mg Oral QHS   sodium bicarbonate  650 mg Oral TID     Data Reviewed:   CBG:  Recent Labs  Lab 04/03/23 1140  GLUCAP 116*    SpO2: 96 % O2 Flow Rate (L/min): 2 L/min    Vitals:   04/04/23 1441 04/04/23 2024 04/04/23 2212 04/05/23 0534  BP: 135/68  (!) 145/56 (!) 157/61  Pulse: 77 81 78 72  Resp: 18 16 20 18   Temp: 97.9 F (36.6 C)  98.5 F (36.9 C) 98.4 F (36.9 C)  TempSrc:   Oral   SpO2: 100% 99% 100% 96%  Weight:      Height:          Data Reviewed:  Basic Metabolic Panel: Recent Labs  Lab 03/30/23 0213 03/31/23 0556 04/01/23 0706 04/02/23  8295 04/03/23 0600 04/04/23 0604 04/05/23 0607  NA 130*   < > 137 140 138 136 138  K 3.1*   < > 4.5 3.3* 3.8 3.9 4.0  CL 97*   < > 112* 111 110 104 105  CO2 21*   < > 17* 20* 21* 22 22  GLUCOSE 94   < > 88 88 127* 95 91  BUN 23   < > 15 11 10 9 10   CREATININE 0.79   < > 0.64 0.51 0.46 0.52 0.60  CALCIUM 8.7*   < > 7.4* 7.6* 8.0* 7.8* 8.1*  MG 1.9  --  2.3 2.0  --   --  1.8   < > = values in this interval not displayed.    CBC: Recent Labs  Lab 03/30/23 0213 03/31/23 0556 04/01/23 0706 04/02/23 0632 04/03/23 0600 04/04/23 0604 04/05/23 0607  WBC 22.4* 21.1* 14.0* 12.6* 15.9* 14.5* 15.0*  NEUTROABS 19.8* 18.3* 11.2*  --   --   --   --   HGB 13.8 11.7* 12.2 12.6 13.5 13.3  13.0  HCT 40.3 35.7* 36.5 39.0 41.4 42.1 39.8  MCV 95.3 98.1 95.1 94.4 94.3 97.7 95.4  PLT 199 171 183 220 256 279 285    LFT Recent Labs  Lab 03/30/23 0213 03/31/23 0556  AST 16 25  ALT 9 12  ALKPHOS 93 87  BILITOT 0.9 0.7  PROT 6.4* 5.0*  ALBUMIN 3.0* 2.2*     Antibiotics: Anti-infectives (From admission, onward)    Start     Dose/Rate Route Frequency Ordered Stop   03/31/23 1000  methenamine (MANDELAMINE) tablet 1,000 mg        1,000 mg Oral 2 times daily 03/30/23 1937     03/31/23 0600  cefTRIAXone (ROCEPHIN) 1 g in sodium chloride 0.9 % 100 mL IVPB  Status:  Discontinued        1 g 200 mL/hr over 30 Minutes Intravenous Every 24 hours 03/30/23 0755 03/30/23 0840   03/30/23 1845  methenamine (HIPREX) tablet 1 g  Status:  Discontinued        1 g Oral Daily 03/30/23 1832 03/30/23 1937   03/30/23 1000  meropenem (MERREM) 1 g in sodium chloride 0.9 % 100 mL IVPB        1 g 200 mL/hr over 30 Minutes Intravenous Every 12 hours 03/30/23 0841     03/30/23 0100  cefTRIAXone (ROCEPHIN) 2 g in sodium chloride 0.9 % 100 mL IVPB        2 g 200 mL/hr over 30 Minutes Intravenous  Once 03/30/23 0055 03/30/23 0228        DVT prophylaxis: Eliquis  Code Status: DNR  Family Communication: Discussed with patient's daughter at bedside   CONSULTS infectious disease   Subjective   Complains of poor appetite.  Also has some nausea.   Objective    Physical Examination:  General-appears in no acute distress Heart-S1-S2, regular, no murmur auscultated Lungs-clear to auscultation bilaterally, no wheezing or crackles auscultated Abdomen-soft, nontender, no organomegaly Extremities-no edema in the lower extremities Neuro-alert, oriented x3, no focal deficit noted  Status is: Inpatient:             [name]   [facility] If 7PM-7AM, please contact night-coverage at [email or web address], Office  [phone]   04/05/2023, 10:24 AM  LOS: 6 days

## 2023-04-06 DIAGNOSIS — A419 Sepsis, unspecified organism: Secondary | ICD-10-CM | POA: Diagnosis not present

## 2023-04-06 DIAGNOSIS — E871 Hypo-osmolality and hyponatremia: Secondary | ICD-10-CM | POA: Diagnosis not present

## 2023-04-06 DIAGNOSIS — E876 Hypokalemia: Secondary | ICD-10-CM | POA: Diagnosis not present

## 2023-04-06 DIAGNOSIS — E782 Mixed hyperlipidemia: Secondary | ICD-10-CM | POA: Diagnosis not present

## 2023-04-06 LAB — CULTURE, BLOOD (ROUTINE X 2)

## 2023-04-06 MED ORDER — FUROSEMIDE 20 MG PO TABS
20.0000 mg | ORAL_TABLET | Freq: Every day | ORAL | Status: DC
  Administered 2023-04-06 – 2023-04-07 (×2): 20 mg via ORAL
  Filled 2023-04-06 (×2): qty 1

## 2023-04-06 MED ORDER — THIAMINE MONONITRATE 100 MG PO TABS
100.0000 mg | ORAL_TABLET | Freq: Every day | ORAL | Status: DC
  Administered 2023-04-07: 100 mg via ORAL
  Filled 2023-04-06: qty 1

## 2023-04-06 MED ORDER — OXYCODONE HCL 5 MG PO TABS
5.0000 mg | ORAL_TABLET | Freq: Four times a day (QID) | ORAL | Status: DC | PRN
  Administered 2023-04-06 (×2): 5 mg via ORAL
  Filled 2023-04-06 (×2): qty 1

## 2023-04-06 MED ORDER — TRAMADOL HCL 50 MG PO TABS: 50.0000 mg | ORAL_TABLET | Freq: Four times a day (QID) | ORAL | Status: DC | PRN

## 2023-04-06 NOTE — Plan of Care (Signed)

## 2023-04-06 NOTE — Progress Notes (Signed)
Physical Therapy Treatment Patient Details Name: [name] MRN: [id number] DOB: 10-22-1934 Today's Date: 04/06/2023   History of Present Illness Patient is a 89 year old female who presented from [facility] with recent UTI diagnosis with failure to improve. Patient was admitted with sepsis secondary to UTI, acute metabolic encephalopathy, Failure to thrive. PMH: anemia, AKI, aortic atherosclerosis, asthma, CAD, carotid stenosis, L hip fx, inferior pubic ramus fx, R eye macular degeneration.    PT Comments  The patient is  pleasant and ready to mobilize to the recliner.  Patient required +2 max support to  stand and pivot to  recliner using  arm hold. Patient's BP 104/44. BP  after reclined, 106/47.   If plan is discharge home, recommend the following: Two people to help with walking and/or transfers;A lot of help with bathing/dressing/bathroom   Can travel by private vehicle        Equipment Recommendations  None recommended by PT    Recommendations for Other Services       Precautions / Restrictions Precautions Precautions: Fall Precaution/Restrictions Comments: Can be lightheaded at EOB, BP does drop Restrictions Weight Bearing Restrictions Per Provider Order: No     Mobility  Bed Mobility   Bed Mobility: Supine to Sit     Supine to sit: Max assist, Used rails, HOB elevated     General bed mobility comments: Assist for trunk and bil LEs. Utilized bedpad to assist with positioning at EOB. Increased time. Cues provided.    Transfers Overall transfer level: Needs assistance Equipment used: 2 person hand held assist Transfers: Sit to/from Stand, Bed to chair/wheelchair/BSC Sit to Stand: Max assist, +2 physical assistance, +2 safety/equipment Stand pivot transfers: Max assist, +2 physical assistance, +2 safety/equipment         General transfer comment: assisted to stand  with 2 armhold. patient stands  but unable to pivot to step to  recliner. Assisted to  trun  on feet    Ambulation/Gait                   Stairs             Wheelchair Mobility     Tilt Bed    Modified Rankin (Stroke Patients Only)       Balance Overall balance assessment: Needs assistance Sitting-balance support: Feet supported, Bilateral upper extremity supported Sitting balance-Leahy Scale: Poor Sitting balance - Comments: tends to lean posteriorly Postural control: Left lateral lean, Posterior lean Standing balance support: Bilateral upper extremity supported, During functional activity Standing balance-Leahy Scale: Zero                              Communication Communication Communication: Impaired Factors Affecting Communication: Hearing impaired  Cognition Arousal: Alert Behavior During Therapy: WFL for tasks assessed/performed                             Following commands: Intact      Cueing Cueing Techniques: Verbal cues, Tactile cues  Exercises      General Comments        Pertinent Vitals/Pain Pain Assessment Faces Pain Scale: Hurts even more Pain Location: BP cuff, arms when held to pivot Pain Descriptors / Indicators: Grimacing Pain Intervention(s): Monitored during session    Home Living  Prior Function            PT Goals (current goals can now be found in the care plan section) Progress towards PT goals: Progressing toward goals    Frequency    Min 1X/week      PT Plan      Co-evaluation              AM-PAC PT "6 Clicks" Mobility   Outcome Measure  Help needed turning from your back to your side while in a flat bed without using bedrails?: A Lot Help needed moving from lying on your back to sitting on the side of a flat bed without using bedrails?: Total Help needed moving to and from a bed to a chair (including a wheelchair)?: Total Help needed standing up from a chair using your arms (e.g., wheelchair or bedside chair)?:  Total Help needed to walk in hospital room?: Total Help needed climbing 3-5 steps with a railing? : Total 6 Click Score: 7    End of Session Equipment Utilized During Treatment: Gait belt Activity Tolerance: Patient tolerated treatment well Patient left: in chair;with call bell/phone within reach;with family/visitor present;with chair alarm set Nurse Communication: Mobility status;Need for lift equipment (STEDY) PT Visit Diagnosis: Muscle weakness (generalized) (M62.81);Other abnormalities of gait and mobility (R26.89)     Time: 1610-9604 PT Time Calculation (min) (ACUTE ONLY): 30 min  Charges:    $Therapeutic Activity: 23-37 mins PT General Charges $$ ACUTE PT VISIT: 1 Visit                    Blanchard Kelch PT Acute Rehabilitation Services Office [phone] Weekend [phone]   Rada Hay 04/06/2023, 4:22 PM

## 2023-04-06 NOTE — Plan of Care (Signed)
  Problem: Respiratory: Goal: Ability to maintain adequate ventilation will improve Outcome: Progressing   Problem: Activity: Goal: Risk for activity intolerance will decrease Outcome: Progressing   Problem: Nutrition: Goal: Adequate nutrition will be maintained Outcome: Progressing   Problem: Pain Managment: Goal: General experience of comfort will improve and/or be controlled Outcome: Progressing

## 2023-04-06 NOTE — Progress Notes (Signed)
[facility]  PROGRESS NOTE  [name]:[id number] DOB: January 16, 1935 DOA: 03/29/2023 PCP: Sherri Northern, MD   Brief HPI:    89 year old with past medical history significant for anemia post menorrhagia, AKI, aortic atherosclerosis, asthma, CAD, carotid stenosis, history of left hip fracture, inferior pubic ramus fracture, hyperlipidemia, hydronephrosis, right eye macular degeneration, paroxysmal ventricular tachycardia, pneumonia, sarcoidosis, [place] recurrent UTI who was recently started on antibiotics for UTI at her facility, but failed to improve.  Continue to develop generalized weakness.  History of resistant bacteria.  Presented with recurrent UTI, UA with greater than 50 white blood cell, leukocytosis white blood cell 22.   Patient presented with sepsis secondary to UTI, ESBL E. coli bacteremia.  She was a started on meropenem on admission.  Repeated blood cultures have been negative.  ID has been consulted, recommend transition to Bactrim on discharge for about 10 days total of antibiotics.     Assessment/Plan:   #ESBL E. coli secondary to UTI # History of ESBL E. coli UTI, followed by urology - Blood cultures and urine cultures on admission grew ESBL E. coli  - Patient has been on meropenem since admission - CT renal study from 2/7 showed nonobstructive mineral calculus,, pleural effusions.  MRI brain on 2/9 showed no acute abnormality - ID engaged for antibiotic recommendations Plan: -Can transition to Bactrim on discharge to treat UTI for about 10 days total EOT 2/15.  Patient has been on meropenem since 2/6, completed 7 days on 2/12.  Meropenem has been discontinued.   -Started on Bactrim  - Discussed with patient's daughter given history of Bactrim allergy.  She states that PCP told her patient had hepatitis C due to Bactrim. Serology noted to be HCV ab negative on 2/10 -HAV non-immune->vaccinate -HBV c and s AB pending   Anorexia/nausea -Started on  Reglan 5 mg IV 3  times daily -Maalox 15 mL 3 times daily for 3 doses -Continue pantoprazole 40 mg p.o. daily   Medications     alum & mag hydroxide-simeth  15 mL Oral TID   apixaban  2.5 mg Oral BID   Budeson-Glycopyrrol-Formoterol  2 puff Inhalation BID   calcium carbonate  2 tablet Oral BID   feeding supplement  237 mL Oral TID BM   ferrous sulfate  325 mg Oral QODAY   guaiFENesin  1,200 mg Oral BID   levothyroxine  100 mcg Oral Daily   melatonin  5 mg Oral QHS   methenamine  1,000 mg Oral BID   oxybutynin  5 mg Oral Daily   pantoprazole  40 mg Oral Daily   polyethylene glycol  17 g Oral BID   polyvinyl alcohol  1 drop Both Eyes BID   saccharomyces boulardii  250 mg Oral BID   senna-docusate  1 tablet Oral BID   sertraline  50 mg Oral QHS   sodium bicarbonate  650 mg Oral TID   sulfamethoxazole-trimethoprim  1 tablet Oral Q12H     Data Reviewed:   CBG:  Recent Labs  Lab 04/03/23 1140  GLUCAP 116*    SpO2: 100 % O2 Flow Rate (L/min): 3 L/min    Vitals:   04/05/23 1308 04/05/23 2027 04/05/23 2230 04/06/23 0549  BP: (!) 117/49  (!) 145/61 (!) 144/63  Pulse: 76  73 68  Resp: 16  18 18   Temp: 97.8 F (36.6 C)  98.5 F (36.9 C) (!) 97.5 F (36.4 C)  TempSrc:      SpO2: 100% 100% 98%  100%  Weight:      Height:          Data Reviewed:  Basic Metabolic Panel: Recent Labs  Lab 04/01/23 0706 04/02/23 0632 04/03/23 0600 04/04/23 0604 04/05/23 0607  NA 137 140 138 136 138  K 4.5 3.3* 3.8 3.9 4.0  CL 112* 111 110 104 105  CO2 17* 20* 21* 22 22  GLUCOSE 88 88 127* 95 91  BUN 15 11 10 9 10   CREATININE 0.64 0.51 0.46 0.52 0.60  CALCIUM 7.4* 7.6* 8.0* 7.8* 8.1*  MG 2.3 2.0  --   --  1.8    CBC: Recent Labs  Lab 03/31/23 0556 04/01/23 0706 04/02/23 0632 04/03/23 0600 04/04/23 0604 04/05/23 0607  WBC 21.1* 14.0* 12.6* 15.9* 14.5* 15.0*  NEUTROABS 18.3* 11.2*  --   --   --   --   HGB 11.7* 12.2 12.6 13.5 13.3 13.0  HCT 35.7* 36.5 39.0 41.4 42.1 39.8  MCV 98.1  95.1 94.4 94.3 97.7 95.4  PLT 171 183 220 256 279 285    LFT Recent Labs  Lab 03/31/23 0556  AST 25  ALT 12  ALKPHOS 87  BILITOT 0.7  PROT 5.0*  ALBUMIN 2.2*     Antibiotics: Anti-infectives (From admission, onward)    Start     Dose/Rate Route Frequency Ordered Stop   04/06/23 1000  sulfamethoxazole-trimethoprim (BACTRIM DS) 800-160 MG per tablet 1 tablet        1 tablet Oral Every 12 hours 04/05/23 1124 04/09/23 0959   03/31/23 1000  methenamine (MANDELAMINE) tablet 1,000 mg        1,000 mg Oral 2 times daily 03/30/23 1937     03/31/23 0600  cefTRIAXone (ROCEPHIN) 1 g in sodium chloride 0.9 % 100 mL IVPB  Status:  Discontinued        1 g 200 mL/hr over 30 Minutes Intravenous Every 24 hours 03/30/23 0755 03/30/23 0840   03/30/23 1845  methenamine (HIPREX) tablet 1 g  Status:  Discontinued        1 g Oral Daily 03/30/23 1832 03/30/23 1937   03/30/23 1000  meropenem (MERREM) 1 g in sodium chloride 0.9 % 100 mL IVPB        1 g 200 mL/hr over 30 Minutes Intravenous Every 12 hours 03/30/23 0841 04/05/23 2242   03/30/23 0100  cefTRIAXone (ROCEPHIN) 2 g in sodium chloride 0.9 % 100 mL IVPB        2 g 200 mL/hr over 30 Minutes Intravenous  Once 03/30/23 0055 03/30/23 0228        DVT prophylaxis: Eliquis  Code Status: DNR  Family Communication: Discussed with patient's daughter at bedside   CONSULTS infectious disease   Subjective   Denies any complaints.   Objective    Physical Examination:  General-appears in no acute distress Heart-S1-S2, regular, no murmur auscultated Lungs-clear to auscultation bilaterally, no wheezing or crackles auscultated Abdomen-soft, nontender, no organomegaly Extremities-no edema in the lower extremities Neuro-alert, oriented x3, no focal deficit noted  Status is: Inpatient:             [name]   [facility] If 7PM-7AM, please contact night-coverage at [email or web address], Office  [phone]   04/06/2023,  10:48 AM  LOS: 7 days

## 2023-04-06 NOTE — TOC Progression Note (Signed)
Transition of Care [facility]) - Progression Note    Patient Details  Name: [name] MRN: [id number] Date of Birth: 15-Feb-1935  [facility]) CM/SW Contact  Otelia Santee, LCSW Phone Number: 04/06/2023, 11:40 AM  Clinical Narrative:    Spoke with pt's daughter who shares she would like pt to return to her nursing room at [facility] vs going to rehab side. Confirmed plan with [facility] who will provide home health PT/OT when pt returns. HH orders will need to be placed prior to discharge.    Expected Discharge Plan: Skilled Nursing Facility Barriers to Discharge: Continued Medical Work up  Expected Discharge Plan and Services In-house Referral: Clinical Social Work Discharge Planning Services: NA Post Acute Care Choice: Resumption of Svcs/PTA Provider, Nursing Home, Skilled Nursing Facility Living arrangements for the past 2 months: Skilled Nursing Facility                 DME Arranged: N/A DME Agency: NA                   Social Determinants of Health (SDOH) Interventions SDOH Screenings   Food Insecurity: No Food Insecurity (03/31/2023)  Housing: Low Risk  (03/31/2023)  Transportation Needs: No Transportation Needs (03/31/2023)  Utilities: Not At Risk (03/30/2023)  Social Connections: Patient Unable To Answer (03/31/2023)  Tobacco Use: Low Risk  (03/30/2023)    Readmission Risk Interventions    03/31/2023   11:08 AM  Readmission Risk Prevention Plan  Transportation Screening Complete  Medication Review (RN Care Manager) Complete  PCP or Specialist appointment within 3-5 days of discharge Complete  HRI or Home Care Consult Complete  SW Recovery Care/Counseling Consult Complete  Palliative Care Screening Complete  Skilled Nursing Facility Complete

## 2023-04-06 NOTE — Progress Notes (Signed)
Occupational Therapy Treatment Patient Details Name: [name] MRN: [id number] DOB: 12-12-1934 Today's Date: 04/06/2023   History of present illness Patient is a 89 year old female who presented from LTC SNF with recent UTI diagnosis with failure to improve. Patient was admitted with sepsis secondary to UTI, acute metabolic encephalopathy, Failure to thrive. PMH: anemia, AKI, aortic atherosclerosis, asthma, CAD, carotid stenosis, L hip fx, inferior pubic ramus fx, R eye macular degeneration.   OT comments  Patient progressing a little and showed improved tolerance to EOB sitting from 3 min to 4 min with improvement of sitting balance with increased time and cues to moments of just CGA at trunk.   Initially up, pt with strong posterior bias which improved with cues and assisting pt with anterior scoot for foot support.  Pt did endorse lightheadedness at EOB but "not too much" and asked to return to supine almost immediately after sitting up but agreed to 4 min with encouragement from OT and dtr.   Patient remains limited by anxiety and fearfulness of falling, severe generalized weakness, impaired vision, RT knee pain (chronic), and decreased activity tolerance along with deficits noted below. Pt continues to demonstrate fair rehab potential and would benefit from continued skilled OT to increase safety and independence with ADLs and functional transfers to allow pt to return home safely and reduce caregiver burden and fall risk.       If plan is discharge home, recommend the following:  Two people to help with walking and/or transfers;A lot of help with bathing/dressing/bathroom;Assistance with cooking/housework;Direct supervision/assist for medications management;Assist for transportation;Help with stairs or ramp for entrance;Direct supervision/assist for financial management;Assistance with feeding;Supervision due to cognitive status;Two people to help with bathing/dressing/bathroom    Equipment Recommendations  None recommended by OT    Recommendations for Other Services      Precautions / Restrictions Precautions Precautions: Fall Precaution/Restrictions Comments: Can be lightheaded at EOB       Mobility Bed Mobility Overal bed mobility: Needs Assistance Bed Mobility: Supine to Sit, Sit to Supine     Supine to sit: Max assist, Used rails, HOB elevated Sit to supine: Max assist, Used rails, HOB elevated   General bed mobility comments: Assist for trunk and bil LEs. Utilized bedpad to assist with positioning at EOB. Increased time. Cues provided. Pt sat EOB for ~ 4 minutes with Mod As, progressing to Min A then CGA for balance. Fatigues very easily. Repeats, "I'm going to fall" despite reassurance. Asks to lie back down almost immediately.    Transfers                         Balance Overall balance assessment: Needs assistance Sitting-balance support: Feet supported, Bilateral upper extremity supported Sitting balance-Leahy Scale: Poor   Postural control: Left lateral lean, Posterior lean                                 ADL either performed or assessed with clinical judgement   ADL Overall ADL's : Needs assistance/impaired Eating/Feeding: Supervision/ safety;Bed level Eating/Feeding Details (indicate cue type and reason): Noted pt had a good amount of her breakfast.         Lower Body Bathing: Bed level;Total assistance               Toileting- Clothing Manipulation and Hygiene: Bed level;Total assistance;+2 for physical assistance;Cueing for sequencing Toileting - Clothing Manipulation  Details (indicate cue type and reason): rolling to replace linens and clean bottom after soiled linens and skin noted upon rolling in bed.     Functional mobility during ADLs: Maximal assistance;Total assistance;+2 for physical assistance;Minimal assistance;Moderate assistance;Cueing for sequencing;Cueing for safety (Bed mobility  Max-Total and EOB balance Min -Mod)      Extremity/Trunk Assessment Upper Extremity Assessment Upper Extremity Assessment: Generalized weakness   Lower Extremity Assessment Lower Extremity Assessment: Generalized weakness   Cervical / Trunk Assessment Cervical / Trunk Assessment: Kyphotic    Vision Baseline Vision/History: 6 Macular Degeneration (RT EYE)     Perception     Praxis     Communication Communication Communication: Impaired Factors Affecting Communication: Hearing impaired   Cognition Arousal: Alert Behavior During Therapy: Anxious Cognition: Cognition impaired         Attention impairment (select first level of impairment): Selective attention Executive functioning impairment (select all impairments): Initiation, Sequencing                   Following commands: Intact        Cueing   Cueing Techniques: Verbal cues, Tactile cues  Exercises Other Exercises Other Exercises: Pt too fatigued after EOB to intiiate exercises but encouraged pt with dtr present to start moving in bed ad lib, including UEs and LEs in pain free ranges and to takebreaks when fatigued. Pt and dtr verbalized understanding.    Shoulder Instructions       General Comments      Pertinent Vitals/ Pain       Pain Assessment Pain Assessment: No/denies pain Faces Pain Scale: No hurt  Home Living                                          Prior Functioning/Environment              Frequency  Min 1X/week        Progress Toward Goals  OT Goals(current goals can now be found in the care plan section)  Progress towards OT goals: Progressing toward goals (Incrementally but sat EOB 1 min longer than last EOB attempt with therapy.)  Acute Rehab OT Goals Patient Stated Goal: "When can I go home?" OT Goal Formulation: With patient/family Time For Goal Achievement: 04/17/23 Potential to Achieve Goals: Fair  Plan      Co-evaluation                  AM-PAC OT "6 Clicks" Daily Activity     Outcome Measure   Help from another person eating meals?: A Little Help from another person taking care of personal grooming?: A Little Help from another person toileting, which includes using toliet, bedpan, or urinal?: Total Help from another person bathing (including washing, rinsing, drying)?: Total Help from another person to put on and taking off regular upper body clothing?: A Lot Help from another person to put on and taking off regular lower body clothing?: Total 6 Click Score: 11    End of Session Equipment Utilized During Treatment: Oxygen  OT Visit Diagnosis: Unsteadiness on feet (R26.81);Other abnormalities of gait and mobility (R26.89);Pain;Muscle weakness (generalized) (M62.81);Dizziness and giddiness (R42) Pain - Right/Left: Right Pain - part of body: Knee   Activity Tolerance Patient limited by fatigue   Patient Left in bed;with call bell/phone within reach;with family/visitor present;with bed alarm set;with nursing/sitter in room   Nurse  Communication Other (comment) (Rn in to assist with hygiene and linen change after sitting EOB.)        Time: 2841-3244 OT Time Calculation (min): 26 min  Charges: OT General Charges $OT Visit: 1 Visit OT Treatments $Self Care/Home Management : 8-22 mins $Therapeutic Activity: 8-22 mins  Victorino Dike, OT Acute Rehab Services Office: [phone] 04/06/2023   Theodoro Clock 04/06/2023, 12:28 PM

## 2023-04-06 NOTE — NC FL2 (Signed)
[place] MEDICAID FL2 LEVEL OF CARE FORM     IDENTIFICATION  Patient Name: [name] Birthdate: 11/11/1934 Sex: female Admission Date (Current Location): 03/29/2023  [facility] and [place] Number:  Producer, television/film/video and Address:  [facility],  [address] [place], [place] [address]      Provider Number: [id number]  Attending Physician Name and Address:  [name], MD  Relative Name and Phone Number:  Braulio Conte (Daughter)  [phone]    Current Level of Care: Hospital Recommended Level of Care: Skilled Nursing Facility, Nursing Facility Prior Approval Number:    Date Approved/Denied:   PASRR Number: [id number]  Discharge Plan: SNF    Current Diagnoses: Patient Active Problem List   Diagnosis Date Noted   Sepsis secondary to UTI (HCC) 03/30/2023   Prolonged QT interval 03/30/2023   Hyponatremia 03/30/2023   Physical deconditioning 03/23/2023   Xerostomia 03/09/2023   Acute on chronic diastolic (congestive) heart failure (HCC) 07/13/2022   Pressure injury of skin 04/27/2022   Hypokalemia 04/27/2022   Bilateral pleural effusion 04/24/2022   Need for emotional support 04/24/2022   High risk medication use 04/24/2022   Pain 04/24/2022   DNR (do not resuscitate) 04/24/2022   End of life care 04/24/2022   Dyspnea 04/24/2022   Palliative care encounter 04/21/2022   Goals of care, counseling/discussion 04/21/2022   Counseling and coordination of care 04/21/2022   Malnutrition of moderate degree 04/20/2022   Atrial fibrillation with RVR (HCC) 04/18/2022   Hypotension 04/18/2022   Failure to thrive in adult 04/18/2022   Acute urinary retention 04/18/2022   RSV (respiratory syncytial virus pneumonia) 03/11/2022   COPD exacerbation (HCC) 03/09/2022   Paroxysmal atrial fibrillation with RVR (HCC) 03/09/2022   Acute respiratory failure with hypoxia (HCC) 03/09/2022   Bronchiectasis without complication (HCC) 03/04/2022   URI (upper respiratory  infection) 03/04/2022   Elevated troponin    Anemia 08/08/2021   Bladder spasm 08/08/2021   Chest pain 08/08/2021   Chest pain, rule out acute myocardial infarction 08/07/2021   Chronic diastolic CHF (congestive heart failure) (HCC) 02/03/2021   Cognitive decline 01/18/2021   Right knee pain 06/08/2020   Age-related osteoporosis without current pathological fracture 05/01/2020   Nasal congestion 05/01/2020   Personal history of COVID-19 05/01/2020   Bilateral hearing loss 11/15/2019   Post-nasal drainage 11/15/2019   COPD (chronic obstructive pulmonary disease) (HCC) 10/30/2019   Atrial fibrillation (HCC)    Lobar pneumonia (HCC) 09/18/2019   Adhesive capsulitis of right shoulder 07/29/2019   Metabolic encephalopathy 07/29/2019   Dysphagia due to suspected esophageal stenosis 07/18/2019   S/P reverse total shoulder arthroplasty, 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   Rotator cuff arthropathy of left shoulder 05/03/2019   Shoulder pain, left 04/29/2019   Low back pain 03/13/2019   Degeneration of lumbar intervertebral disc 02/14/2019   Amnesia 02/06/2018   ETD (Eustachian tube dysfunction), bilateral 11/07/2017   Chronic nonintractable headache 11/07/2017   Otalgia 10/17/2017   Pain in joint of right elbow 05/02/2017   Rib lesion 04/20/2017   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   Atopic dermatitis 04/08/2016   Skin sensation disturbance 03/08/2016   Weakness 12/22/2015   Allergic rhinitis 09/15/2015   CAD (coronary artery disease) 05/18/2015   Essential hypertension 05/18/2015   Carotid artery disease (HCC) 05/18/2015   Cough 03/19/2015   Shortness of breath 12/26/2013   Abnormal gait 11/06/2013   Idiopathic scoliosis and kyphoscoliosis 07/30/2013   Acquired unequal leg length on left 04/11/2013  History of fracture 10/18/2012   Palpitations 10/18/2012   Constipation 09/05/2012   History of sinus tachycardia 09/05/2012   Spasm of back muscles 03/12/2012   Lumbar  radiculopathy 11/23/2011   Mitral regurgitation 11/22/2011   Low compliance bladder 04/18/2011   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   Carpal tunnel syndrome 02/23/2009   Cardiovascular symptoms 02/23/2009   Vitamin D deficiency 02/23/2009   RECTAL BLEEDING 08/18/2008   History of colonic polyps 08/18/2008   SKIN CANCER, HX OF 08/14/2008   CARPAL TUNNEL SYNDROME, HX OF 08/14/2008   SARCOIDOSIS, PULMONARY 08/08/2008   HLD (hyperlipidemia) 08/08/2008   Paroxysmal supraventricular tachycardia (HCC) 08/08/2008   GERD (gastroesophageal reflux disease) 08/08/2008    Orientation RESPIRATION BLADDER Height & Weight     Self, Place  O2 (3L) Incontinent, External catheter Weight: 119 lb 0.8 oz (54 kg) Height:  5\' 3"  (160 cm)  BEHAVIORAL SYMPTOMS/MOOD NEUROLOGICAL BOWEL NUTRITION STATUS      Continent Diet (Heart Healthy)  AMBULATORY STATUS COMMUNICATION OF NEEDS Skin   Extensive Assist Verbally PU Stage and Appropriate Care (Sacrum and buttocks)                       Personal Care Assistance Level of Assistance  Bathing, Feeding, Dressing Bathing Assistance: Maximum assistance Feeding assistance: Limited assistance Dressing Assistance: Maximum assistance     Functional Limitations Info  Sight, Hearing, Speech Sight Info: Adequate Hearing Info: Adequate Speech Info: Adequate    SPECIAL CARE FACTORS FREQUENCY  PT (By licensed PT), OT (By licensed OT)     PT Frequency: 5x/wk OT Frequency: 5x/wk            Contractures Contractures Info: Not present    Additional Factors Info  Code Status, Allergies Code Status Info: DNR Allergies Info: Levaquin (Levofloxacin), Oxycodone, Oxycodone-acetaminophen, Sulfonamide Derivatives, Sulfa Antibiotics, Morphine           Current Medications (04/06/2023):  This is the current hospital active medication list Current Facility-Administered Medications  Medication Dose Route Frequency Provider Last Rate Last Admin    acetaminophen (TYLENOL) tablet 650 mg  650 mg Oral Q6H PRN Bobette Mo, MD   650 mg at 04/03/23 0932   Or   acetaminophen (TYLENOL) suppository 650 mg  650 mg Rectal Q6H PRN Bobette Mo, MD       alum & mag hydroxide-simeth (MAALOX/MYLANTA) 200-200-20 MG/5ML suspension 15 mL  15 mL Oral TID [name], MD   15 mL at 04/06/23 0954   antiseptic oral rinse (BIOTENE) solution 15 mL  15 mL Mouth Rinse PRN Regalado, Belkys A, MD       apixaban (ELIQUIS) tablet 2.5 mg  2.5 mg Oral BID Bobette Mo, MD   2.5 mg at 04/06/23 4098   bisacodyl (DULCOLAX) EC tablet 5 mg  5 mg Oral Daily PRN Regalado, Belkys A, MD       Budeson-Glycopyrrol-Formoterol 160-9-4.8 MCG/ACT AERO 2 puff  2 puff Inhalation BID Regalado, Belkys A, MD   2 puff at 04/05/23 2026   calcium carbonate (TUMS - dosed in mg elemental calcium) chewable tablet 400 mg of elemental calcium  2 tablet Oral BID Bobette Mo, MD   400 mg of elemental calcium at 04/06/23 1191   feeding supplement (ENSURE ENLIVE / ENSURE PLUS) liquid 237 mL  237 mL Oral TID BM Regalado, Belkys A, MD   237 mL at 04/06/23 1006   ferrous sulfate tablet 325 mg  325 mg Oral  Lamont Snowball, MD   325 mg at 04/06/23 8469   furosemide (LASIX) tablet 20 mg  20 mg Oral Daily [name], MD       guaiFENesin [facility]) 12 hr tablet 1,200 mg  1,200 mg Oral BID Bobette Mo, MD   1,200 mg at 04/06/23 6295   ipratropium-albuterol (DUONEB) 0.5-2.5 (3) MG/3ML nebulizer solution 3 mL  3 mL Nebulization Q6H PRN Bobette Mo, MD       levothyroxine (SYNTHROID) tablet 100 mcg  100 mcg Oral Daily Bobette Mo, MD   100 mcg at 04/06/23 2841   melatonin tablet 5 mg  5 mg Oral QHS Bobette Mo, MD   5 mg at 04/05/23 2158   methenamine (MANDELAMINE) tablet 1,000 mg  1,000 mg Oral BID Bobette Mo, MD   1,000 mg at 04/06/23 3244   oxybutynin (DITROPAN) tablet 5 mg  5 mg Oral Daily Bobette Mo, MD   5 mg at  04/06/23 0954   pantoprazole (PROTONIX) EC tablet 40 mg  40 mg Oral Daily Bobette Mo, MD   40 mg at 04/06/23 0953   polyethylene glycol (MIRALAX / GLYCOLAX) packet 17 g  17 g Oral BID Regalado, Belkys A, MD   17 g at 04/06/23 0954   polyvinyl alcohol (LIQUIFILM TEARS) 1.4 % ophthalmic solution 1 drop  1 drop Both Eyes BID Bobette Mo, MD   1 drop at 04/06/23 1006   saccharomyces boulardii (FLORASTOR) capsule 250 mg  250 mg Oral BID Regalado, Belkys A, MD   250 mg at 04/06/23 0102   senna-docusate (Senokot-S) tablet 1 tablet  1 tablet Oral BID Regalado, Belkys A, MD   1 tablet at 04/06/23 0953   sertraline (ZOLOFT) tablet 50 mg  50 mg Oral QHS Bobette Mo, MD   50 mg at 04/05/23 2158   sodium bicarbonate tablet 650 mg  650 mg Oral TID Regalado, Belkys A, MD   650 mg at 04/06/23 7253   sulfamethoxazole-trimethoprim (BACTRIM DS) 800-160 MG per tablet 1 tablet  1 tablet Oral Q12H Danelle Earthly, MD   1 tablet at 04/06/23 6644   thiamine (VITAMIN B1) 500 mg in sodium chloride 0.9 % 50 mL IVPB  500 mg Intravenous Daily Regalado, Belkys A, MD 110 mL/hr at 04/05/23 1035 500 mg at 04/05/23 1035     Discharge Medications: Please see discharge summary for a list of discharge medications.  Relevant Imaging Results:  Relevant Lab Results:   Additional Information SSN: [id number]  Otelia Santee, LCSW

## 2023-04-06 NOTE — Progress Notes (Signed)
Patient trying to remove nasal cannula. Weaned to 2L- currently 100% on 2L

## 2023-04-07 DIAGNOSIS — A419 Sepsis, unspecified organism: Secondary | ICD-10-CM | POA: Diagnosis not present

## 2023-04-07 DIAGNOSIS — E871 Hypo-osmolality and hyponatremia: Secondary | ICD-10-CM | POA: Diagnosis not present

## 2023-04-07 DIAGNOSIS — E782 Mixed hyperlipidemia: Secondary | ICD-10-CM | POA: Diagnosis not present

## 2023-04-07 DIAGNOSIS — E440 Moderate protein-calorie malnutrition: Secondary | ICD-10-CM | POA: Diagnosis not present

## 2023-04-07 MED ORDER — SACCHAROMYCES BOULARDII 250 MG PO CAPS: 250.0000 mg | ORAL_CAPSULE | Freq: Two times a day (BID) | ORAL | Status: AC

## 2023-04-07 MED ORDER — SENNOSIDES-DOCUSATE SODIUM 8.6-50 MG PO TABS: 1.0000 | ORAL_TABLET | Freq: Every evening | ORAL | Status: AC | PRN

## 2023-04-07 MED ORDER — METOCLOPRAMIDE HCL 5 MG PO TABS: 5.0000 mg | ORAL_TABLET | Freq: Three times a day (TID) | ORAL | Status: DC | PRN

## 2023-04-07 MED ORDER — SULFAMETHOXAZOLE-TRIMETHOPRIM 800-160 MG PO TABS: 1.0000 | ORAL_TABLET | Freq: Two times a day (BID) | ORAL | Status: AC

## 2023-04-07 MED ORDER — VITAMIN B-1 100 MG PO TABS: 100.0000 mg | ORAL_TABLET | Freq: Every day | ORAL | Status: DC

## 2023-04-07 MED ORDER — OXYCODONE HCL 5 MG PO TABS: 5.0000 mg | ORAL_TABLET | Freq: Four times a day (QID) | ORAL | 0 refills | Status: DC | PRN

## 2023-04-07 NOTE — Discharge Summary (Addendum)
Physician Discharge Summary   Patient: [name] MRN: [id number] DOB: 14-Dec-1934  Admit date:     03/29/2023  Discharge date: 04/07/23  Discharge Physician: [name]   PCP: Eloisa Northern, MD   Recommendations at discharge:   Continue taking Bactrim till 04/08/2023  Discharge Diagnoses: Principal Problem:   Sepsis secondary to UTI [facility]) Active Problems:   HLD (hyperlipidemia)   Malnutrition of moderate degree   Hypokalemia   Prolonged QT interval   Hyponatremia  Resolved Problems:   * No resolved hospital problems. *  Hospital Course: 89 year old with past medical history significant for anemia post menorrhagia, AKI, aortic atherosclerosis, asthma, CAD, carotid stenosis, history of left hip fracture, inferior pubic ramus fracture, hyperlipidemia, hydronephrosis, right eye macular degeneration, paroxysmal ventricular tachycardia, pneumonia, sarcoidosis, [place] recurrent UTI who was recently started on antibiotics for UTI at her facility, but failed to improve.  Continue to develop generalized weakness.  History of resistant bacteria.  Presented with recurrent UTI, UA with greater than 50 white blood cell, leukocytosis white blood cell 22.   Patient presented with sepsis secondary to UTI, ESBL E. coli bacteremia.  She was a started on meropenem on admission.  Repeated blood cultures have been negative.  ID has been consulted, recommend transition to Bactrim on discharge for about 10 days total of antibiotics.    Assessment and Plan:  #ESBL E. coli secondary to UTI # History of ESBL E. coli UTI, followed by urology - Blood cultures and urine cultures on admission grew ESBL E. coli  - Patient has been on meropenem since admission - CT renal study from 2/7 showed nonobstructive mineral calculus,, pleural effusions.  MRI brain on 2/9 showed no acute abnormality - ID engaged for antibiotic recommendations Plan: -Can transition to Bactrim on discharge to treat UTI for about 10 days  total EOT 2/15.  Patient has been on meropenem since 2/6, completed 7 days on 2/12.  Meropenem has been discontinued.   -Started on Bactrim  - Discussed with patient's daughter given history of Bactrim allergy.  She states that PCP told her patient had hepatitis C due to Bactrim. Serology noted to be HCV ab negative on 2/10 -HAV non-immune->vaccinate -HBV c and s AB pending   Acute Metabolic Encephalopathy -resolved -Likely in setting of infection MRI brain negative for stroke Ammonia level 19 ABG showed hypoxemia, respiratory alkalosis , briefly required oxygen Has been weaned off oxygen     Constipation, Nausea:  Continue with Miralax, senna Had Fleet enema 2/10. She  had Large BM today per nurse.  Continue Senokot as tablet as needed   E. coli bacteremia: -Follow final blood cultures result pending -Continue with meropenem -2/08 Blood culture: No growth to date -ID consulted. Discharge on Bactrim to complete 10 days.  End of treatment on 04/08/2023   Metabolic acidosis: Started  sodium bicarb tablet given -Resolved   Hypokalemia: -Replaced    Hyponatremia:  Improved with fluids.  In setting hypovolemia.    Acute Chronic diastolic heart failure IV lasix give 2/10. Chest x ray : Mild HF, elevated BNP Continue Lasix 20 mg p.o. daily   COPD, history of sarcoid Continue with  Budeson, Glycopyrrol, formeterol.  Duoneb   History of paroxysmal A-fib: Continue with Eliquis Amiodarone is not on med list anymore   Malnutrition of moderate degree: Started  Supplement   Hypothyroidism: Continue with Synthroid TSH normal   Chronic bilateral pleural effusion Stable, not requiring oxygen  Anorexia/nausea -Significantly improved -Started on  Reglan  5 mg IV 3 times daily -Maalox 15 mL 3 times daily for 3 doses -Continue pantoprazole 40 mg p.o. daily -Will continue with Reglan 5 mg p.o. 3 times daily as needed for nausea and vomiting   Prolonged QT  interval -Resolved after correcting magnesium      Consultants:  Procedures performed:  Disposition: Home Diet recommendation:  Regular diet DISCHARGE MEDICATION: Allergies as of 04/07/2023       Reactions   Levaquin [levofloxacin] Other (See Comments)   Joint pain.. Per doctor, told to not to take again and "allergic," per [facility]   Oxycodone Nausea And Vomiting, Other (See Comments)   Can take oxycodone with Zofran (otherwise, GI Intolerance)   Oxycodone-acetaminophen Other (See Comments)   GI Intolerance and "talking out of my head" and "allergic," per [facility]   Sulfonamide Derivatives Other (See Comments)   Possibly caused hepatitis and "allergic," per [facility]   Sulfa Antibiotics Other (See Comments)   Possibly caused hepatitis and "allergic," per [facility]   Morphine Nausea Only, Other (See Comments)   GI intolerance and "allergic," per [facility]        Medication List     STOP taking these medications    cyanocobalamin 1000 MCG tablet Commonly known as: VITAMIN B12   traMADol 50 MG tablet Commonly known as: ULTRAM       TAKE these medications    acetaminophen 500 MG tablet Commonly known as: TYLENOL Take 1,000 mg by mouth every 6 (six) hours as needed (for pain).   acetaminophen 325 MG tablet Commonly known as: TYLENOL Take 650 mg by mouth stat. For fever   apixaban 2.5 MG Tabs tablet Commonly known as: ELIQUIS Take 2.5 mg by mouth 2 (two) times daily.   ascorbic acid 500 MG tablet Commonly known as: VITAMIN C Take 500 mg by mouth daily.   Breztri Aerosphere 160-9-4.8 MCG/ACT Aero Generic drug: Budeson-Glycopyrrol-Formoterol Inhale 2 puffs into the lungs in the morning and at bedtime.   ferrous sulfate 325 (65 FE) MG tablet Take 325 mg by mouth every other day. Take with orange juice when possible   furosemide 20 MG tablet Commonly known as: LASIX Take 1 tablet (20 mg total) by mouth daily.   guaiFENesin 600 MG 12 hr tablet Commonly known as: MUCINEX Take 2  tablets (1,200 mg total) by mouth 2 (two) times daily.   ipratropium-albuterol 0.5-2.5 (3) MG/3ML Soln Commonly known as: DUONEB Take 3 mLs by nebulization every 6 (six) hours as needed (wheezing; shortness of breath).   levothyroxine 50 MCG tablet Commonly known as: SYNTHROID Take 100 mcg by mouth daily.   Melatonin 5 MG Chew Chew 5 mg by mouth at bedtime.   Menthol (Topical Analgesic) 4 % Gel Apply 1 Dose/kg topically every 6 (six) hours as needed (arthritic pain control).   methenamine 1 g tablet Commonly known as: HIPREX Take 1 g by mouth daily.   metoCLOPramide 5 MG tablet Commonly known as: Reglan Take 1 tablet (5 mg total) by mouth every 8 (eight) hours as needed for nausea or vomiting.   omeprazole 40 MG capsule Commonly known as: PRILOSEC Take 40 mg by mouth in the morning and at bedtime.   ondansetron 4 MG tablet Commonly known as: ZOFRAN Take 4 mg by mouth every 6 (six) hours as needed for nausea or vomiting.   oxybutynin 5 MG tablet Commonly known as: DITROPAN Take 5 mg by mouth daily.   oxyCODONE 5 MG immediate release tablet Commonly known as: Oxy IR/ROXICODONE Take  1 tablet (5 mg total) by mouth every 6 (six) hours as needed for severe pain (pain score 7-10).   polyethylene glycol 17 g packet Commonly known as: MIRALAX / GLYCOLAX Take 17 g by mouth daily as needed for mild constipation.   PROPYLENE GLYCOL OP Place 2 drops into both eyes 2 (two) times daily.   saccharomyces boulardii 250 MG capsule Commonly known as: FLORASTOR Take 1 capsule (250 mg total) by mouth 2 (two) times daily.   senna-docusate 8.6-50 MG tablet Commonly known as: Senokot-S Take 1 tablet by mouth at bedtime as needed for mild constipation.   sertraline 50 MG tablet Commonly known as: ZOLOFT Take 1 tablet (50 mg total) by mouth at bedtime.   sodium chloride 0.65 % Soln nasal spray Commonly known as: OCEAN Place 1 spray into both nostrils 2 (two) times daily.    sulfamethoxazole-trimethoprim 800-160 MG tablet Commonly known as: BACTRIM DS Take 1 tablet by mouth every 12 (twelve) hours for 1 day.   thiamine 100 MG tablet Commonly known as: Vitamin B-1 Take 1 tablet (100 mg total) by mouth daily. Start taking on: April 08, 2023   TUMS ULTRA 1000 PO Take 1,000 mg by mouth 2 (two) times daily.        Discharge Exam: Filed Weights   03/31/23 0259  Weight: 54 kg   General-appears in no acute distress Heart-S1-S2, regular, no murmur auscultated Lungs-clear to auscultation bilaterally, no wheezing or crackles auscultated Abdomen-soft, nontender, no organomegaly Extremities-no edema in the lower extremities Neuro-alert, oriented x3, no focal deficit noted  Condition at discharge: good  The results of significant diagnostics from this hospitalization (including imaging, microbiology, ancillary and laboratory) are listed below for reference.   Imaging Studies: DG CHEST PORT 1 VIEW Result Date: 04/03/2023 CLINICAL DATA:  Shortness of breath. EXAM: PORTABLE CHEST 1 VIEW COMPARISON:  03/30/2023 and CT chest 04/18/2022. FINDINGS: Patient is rotated. Trachea is midline. Heart is enlarged, stable. Thoracic aorta is calcified. Bibasilar airspace opacification. Probable small bilateral pleural effusions. IMPRESSION: Suspect mild congestive heart failure. Possible bibasilar atelectasis. Electronically Signed   By: Leanna Battles M.D.   On: 04/03/2023 14:12   MR BRAIN WO CONTRAST Addendum Date: 04/02/2023 ADDENDUM REPORT: 04/02/2023 12:13 ADDENDUM: The report was inadvertently finalized before images were available. FINDINGS: Brain: No acute infarct, hemorrhage, or mass lesion is present. Moderate atrophy and white matter disease is present bilaterally. The ventricles are proportionate to the degree of atrophy. No significant extraaxial fluid collection is present. Deep brain nuclei are within normal limits. Remote lacunar infarcts are present in the  cerebellum bilaterally, right greater than left. The brainstem and cerebellum are otherwise within normal limits. The internal auditory canals are within normal limits. Midline structures are within normal limits. Vascular: Flow is present in the major intracranial arteries. Skull and upper cervical spine: The craniocervical junction is normal. Upper cervical spine is within normal limits. Marrow signal is unremarkable. Sinuses/orbits: The paranasal sinuses and mastoid air cells are clear. Bilateral lens replacements are noted. Globes and orbits are otherwise unremarkable. Impressions: 1. Stable appearance of moderate atrophy and diffuse white matter disease likely reflects the sequela of chronic microvascular ischemia. 2. No acute intracranial abnormality. Electronically Signed   By: Marin Roberts M.D.   On: 04/02/2023 12:13   Result Date: 04/02/2023 CLINICAL DATA:  Altered mental status. Confusion. EXAM: MRI HEAD WITHOUT CONTRAST TECHNIQUE: Multiplanar, multiecho pulse sequences of the brain and surrounding structures were obtained without intravenous contrast. COMPARISON:  None Available. FINDINGS:  Brain: Vascular: Skull and upper cervical spine: Sinuses/Orbits: Other: Electronically Signed: By: Marin Roberts M.D. On: 04/02/2023 11:49   CT RENAL STONE STUDY Result Date: 03/31/2023 CLINICAL DATA:  Abdominal/flank pain, stone suspected EXAM: CT ABDOMEN AND PELVIS WITHOUT CONTRAST TECHNIQUE: Multidetector CT imaging of the abdomen and pelvis was performed following the standard protocol without IV contrast. RADIATION DOSE REDUCTION: This exam was performed according to the departmental dose-optimization program which includes automated exposure control, adjustment of the mA and/or kV according to patient size and/or use of iterative reconstruction technique. COMPARISON:  04/18/2022 FINDINGS: Lower chest: Stable hiatal hernia. Chronic bilateral pleural effusions and dependent lower lobe atelectasis,  left greater than right. Hepatobiliary: Unremarkable unenhanced appearance of the liver and gallbladder. No biliary duct dilation. Pancreas: Extensive fatty infiltration of the pancreas. No acute inflammatory changes or pancreatic duct dilation. Spleen: Unremarkable unenhanced appearance. Adrenals/Urinary Tract: Stable nonobstructing 3 mm calculus lower pole left kidney. No right-sided calculi. Stable simple right renal cyst does not require specific imaging follow-up. Stable appearance of the adrenals. Bladder is decompressed, limiting its evaluation. Stomach/Bowel: No bowel obstruction or ileus. Moderate stool throughout the colon consistent with constipation. No bowel wall thickening or inflammatory change. Hiatal hernia. Vascular/Lymphatic: Aortic atherosclerosis. No enlarged abdominal or pelvic lymph nodes. Reproductive: Prior hysterectomy.  No adnexal masses. Other: Trace pelvic free fluid, nonspecific. No free intraperitoneal gas. Small fat containing umbilical hernia. Small left inguinal hernia containing fat and trace fluid. No bowel herniation. Musculoskeletal: Stable right hip arthroplasty and left hip ORIF. Chronic healed fractures of the bilateral iliac bones, inferior pubic rami, and sacral ala. Chronic compression deformities of the superior endplates at L4 and L5. No acute bony abnormalities. Reconstructed images demonstrate no additional findings. IMPRESSION: 1. Nonobstructing 3 mm left renal calculus. 2. Moderate retained stool throughout the colon consistent with constipation. No bowel obstruction or ileus. 3. Small fat containing umbilical and left inguinal hernias, stable. No bowel herniation. 4. Stable hiatal hernia. 5. Chronic bilateral pleural effusions and dependent lower lobe atelectasis, left greater than right, unchanged since previous exam. 6.  Aortic Atherosclerosis (ICD10-I70.0). Electronically Signed   By: Sharlet Salina M.D.   On: 03/31/2023 19:50   DG Chest Port 1 View Result  Date: 03/30/2023 CLINICAL DATA:  Question of sepsis to evaluate for abnormality. Weakness and urinary tract infection. EXAM: PORTABLE CHEST 1 VIEW COMPARISON:  11/09/2022 FINDINGS: Shallow inspiration. Mild cardiac enlargement. No vascular congestion, edema, or consolidation. Central interstitial changes in the lungs may represent chronic bronchitis. No pleural effusion or pneumothorax. Mediastinal contours appear intact. Calcification of the aorta. Degenerative changes in the spine and right shoulder. Postoperative change in the left shoulder. IMPRESSION: Emphysematous changes in the lungs. Chronic bronchitic changes. Mild cardiac enlargement. No focal consolidation. Electronically Signed   By: Burman Nieves M.D.   On: 03/30/2023 01:19    Microbiology: Results for orders placed or performed during the hospital encounter of 03/29/23  Blood Culture (routine x 2)     Status: Abnormal   Collection Time: 03/30/23  1:11 AM   Specimen: BLOOD LEFT ARM  Result Value Ref Range Status   Specimen Description   Final    BLOOD LEFT ARM Performed at [facility], [address] [place], [place] [address]    Special Requests   Final    BOTTLES DRAWN AEROBIC AND ANAEROBIC Blood Culture adequate volume Performed at [facility], [address] [place], [place] [address]    Culture  Setup Time  Final    GRAM NEGATIVE RODS IN BOTH AEROBIC AND ANAEROBIC BOTTLES CRITICAL VALUE NOTED.  VALUE IS CONSISTENT WITH PREVIOUSLY REPORTED AND CALLED VALUE.    Culture (A)  Final    ESCHERICHIA COLI SUSCEPTIBILITIES PERFORMED ON PREVIOUS CULTURE WITHIN THE LAST 5 DAYS. Performed at [facility], [address] [place], [place] [address]    Report Status 04/01/2023 FINAL  Final  Resp panel by RT-PCR (RSV, Flu A&B, Covid) Anterior Nasal Swab     Status: None   Collection Time: 03/30/23  1:11 AM   Specimen: Anterior Nasal Swab  Result Value Ref Range Status   SARS Coronavirus 2 by RT  PCR NEGATIVE NEGATIVE Final    Comment: (NOTE) SARS-CoV-2 target nucleic acids are NOT DETECTED.  The SARS-CoV-2 RNA is generally detectable in upper respiratory specimens during the acute phase of infection. The lowest concentration of SARS-CoV-2 viral copies this assay can detect is 138 copies/mL. A negative result does not preclude SARS-Cov-2 infection and should not be used as the sole basis for treatment or other patient management decisions. A negative result may occur with  improper specimen collection/handling, submission of specimen other than nasopharyngeal swab, presence of viral mutation(s) within the areas targeted by this assay, and inadequate number of viral copies(<138 copies/mL). A negative result must be combined with clinical observations, patient history, and epidemiological information. The expected result is Negative.  Fact Sheet for Patients:  BloggerCourse.com  Fact Sheet for Healthcare Providers:  SeriousBroker.it  This test is no t yet approved or cleared by the [place] FDA and  has been authorized for detection and/or diagnosis of SARS-CoV-2 by FDA under an Emergency Use Authorization (EUA). This EUA will remain  in effect (meaning this test can be used) for the duration of the COVID-19 declaration under Section 564(b)(1) of the Act, 21 U.S.C.section 360bbb-3(b)(1), unless the authorization is terminated  or revoked sooner.       Influenza A by PCR NEGATIVE NEGATIVE Final   Influenza B by PCR NEGATIVE NEGATIVE Final    Comment: (NOTE) The Xpert Xpress SARS-CoV-2/FLU/RSV plus assay is intended as an aid in the diagnosis of influenza from Nasopharyngeal swab specimens and should not be used as a sole basis for treatment. Nasal washings and aspirates are unacceptable for Xpert Xpress SARS-CoV-2/FLU/RSV testing.  Fact Sheet for Patients: BloggerCourse.com  Fact Sheet for  Healthcare Providers: SeriousBroker.it  This test is not yet approved or cleared by the [place] FDA and has been authorized for detection and/or diagnosis of SARS-CoV-2 by FDA under an Emergency Use Authorization (EUA). This EUA will remain in effect (meaning this test can be used) for the duration of the COVID-19 declaration under Section 564(b)(1) of the Act, 21 U.S.C. section 360bbb-3(b)(1), unless the authorization is terminated or revoked.     Resp Syncytial Virus by PCR NEGATIVE NEGATIVE Final    Comment: (NOTE) Fact Sheet for Patients: BloggerCourse.com  Fact Sheet for Healthcare Providers: SeriousBroker.it  This test is not yet approved or cleared by the [place] FDA and has been authorized for detection and/or diagnosis of SARS-CoV-2 by FDA under an Emergency Use Authorization (EUA). This EUA will remain in effect (meaning this test can be used) for the duration of the COVID-19 declaration under Section 564(b)(1) of the Act, 21 U.S.C. section 360bbb-3(b)(1), unless the authorization is terminated or revoked.  Performed at [facility], [address] [place], [place] [address]   Urine Culture     Status: Abnormal  Collection Time: 03/30/23  1:21 AM   Specimen: Urine, Random  Result Value Ref Range Status   Specimen Description   Final    URINE, RANDOM Performed at [facility], [address] [place], [place] [address]    Special Requests   Final    NONE Reflexed from [id number] Performed at [facility], [address], [place], [place] [address]    Culture (A)  Final    >=100,000 COLONIES/mL ESCHERICHIA COLI Confirmed Extended Spectrum Beta-Lactamase Producer (ESBL).  In bloodstream infections from ESBL organisms, carbapenems are preferred over piperacillin/tazobactam. They are shown to have a lower risk of mortality. Two  isolates with different morphologies were identified as the same organism.The most resistant organism was reported. Performed at [facility], [address] [place], [place] [address]    Report Status 04/01/2023 FINAL  Final   Organism ID, Bacteria ESCHERICHIA COLI (A)  Final      Susceptibility   Escherichia coli - MIC*    AMPICILLIN >=32 RESISTANT Resistant     CEFAZOLIN >=64 RESISTANT Resistant     CEFEPIME >=32 RESISTANT Resistant     CEFTRIAXONE >=64 RESISTANT Resistant     CIPROFLOXACIN >=4 RESISTANT Resistant     GENTAMICIN >=16 RESISTANT Resistant     IMIPENEM <=0.25 SENSITIVE Sensitive     NITROFURANTOIN 128 RESISTANT Resistant     TRIMETH/SULFA <=20 SENSITIVE Sensitive     AMPICILLIN/SULBACTAM >=32 RESISTANT Resistant     PIP/TAZO >=128 RESISTANT Resistant ug/mL    * >=100,000 COLONIES/mL ESCHERICHIA COLI  Blood Culture (routine x 2)     Status: Abnormal   Collection Time: 03/30/23  3:26 AM   Specimen: BLOOD RIGHT ARM  Result Value Ref Range Status   Specimen Description   Final    BLOOD RIGHT ARM Performed at [facility], [address] [place], [place] [address]    Special Requests   Final    BOTTLES DRAWN AEROBIC AND ANAEROBIC Blood Culture adequate volume Performed at [facility], [address] [place], [place] [address]    Culture  Setup Time   Final    GRAM NEGATIVE RODS IN BOTH AEROBIC AND ANAEROBIC BOTTLES CRITICAL RESULT CALLED TO, READ BACK BY AND VERIFIED WITH: PHARMD ANH PHAM ON 03/30/23 @ 1818 BY DRT Performed at [facility], [address] [place], [place] [address]    Culture (A)  Final    ESCHERICHIA COLI Confirmed Extended Spectrum Beta-Lactamase Producer (ESBL).  In bloodstream infections from ESBL organisms, carbapenems are preferred over piperacillin/tazobactam. They are shown to have a lower risk of mortality.    Report Status 04/02/2023 FINAL  Final   Organism ID, Bacteria ESCHERICHIA COLI  Final       Susceptibility   Escherichia coli - MIC*    AMPICILLIN >=32 RESISTANT Resistant     CEFEPIME >=32 RESISTANT Resistant     CEFTAZIDIME RESISTANT Resistant     CEFTRIAXONE >=64 RESISTANT Resistant     CIPROFLOXACIN >=4 RESISTANT Resistant     GENTAMICIN >=16 RESISTANT Resistant     IMIPENEM <=0.25 SENSITIVE Sensitive     TRIMETH/SULFA <=20 SENSITIVE Sensitive     AMPICILLIN/SULBACTAM >=32 RESISTANT Resistant     PIP/TAZO >=128 RESISTANT Resistant ug/mL    * ESCHERICHIA COLI  Blood Culture ID Panel (Reflexed)     Status: Abnormal   Collection Time: 03/30/23  3:26 AM  Result Value Ref Range Status   Enterococcus faecalis  NOT DETECTED NOT DETECTED Final   Enterococcus Faecium NOT DETECTED NOT DETECTED Final   Listeria monocytogenes NOT DETECTED NOT DETECTED Final   Staphylococcus species NOT DETECTED NOT DETECTED Final   Staphylococcus aureus (BCID) NOT DETECTED NOT DETECTED Final   Staphylococcus epidermidis NOT DETECTED NOT DETECTED Final   Staphylococcus lugdunensis NOT DETECTED NOT DETECTED Final   Streptococcus species NOT DETECTED NOT DETECTED Final   Streptococcus agalactiae NOT DETECTED NOT DETECTED Final   Streptococcus pneumoniae NOT DETECTED NOT DETECTED Final   Streptococcus pyogenes NOT DETECTED NOT DETECTED Final   A.calcoaceticus-baumannii NOT DETECTED NOT DETECTED Final   Bacteroides fragilis NOT DETECTED NOT DETECTED Final   Enterobacterales DETECTED (A) NOT DETECTED Final    Comment: Enterobacterales represent a large order of gram negative bacteria, not a single organism. CRITICAL RESULT CALLED TO, READ BACK BY AND VERIFIED WITH: PHARMD ANH PHAM ON 03/30/23 @ 1818 BY DRT    Enterobacter cloacae complex NOT DETECTED NOT DETECTED Final   Escherichia coli DETECTED (A) NOT DETECTED Final    Comment: CRITICAL RESULT CALLED TO, READ BACK BY AND VERIFIED WITH: PHARMD ANH PHAM ON 03/30/23 @ 1818 BY DRT    Klebsiella aerogenes NOT DETECTED NOT DETECTED Final   Klebsiella  oxytoca NOT DETECTED NOT DETECTED Final   Klebsiella pneumoniae NOT DETECTED NOT DETECTED Final   Proteus species NOT DETECTED NOT DETECTED Final   Salmonella species NOT DETECTED NOT DETECTED Final   Serratia marcescens NOT DETECTED NOT DETECTED Final   Haemophilus influenzae NOT DETECTED NOT DETECTED Final   Neisseria meningitidis NOT DETECTED NOT DETECTED Final   Pseudomonas aeruginosa NOT DETECTED NOT DETECTED Final   Stenotrophomonas maltophilia NOT DETECTED NOT DETECTED Final   Candida albicans NOT DETECTED NOT DETECTED Final   Candida auris NOT DETECTED NOT DETECTED Final   Candida glabrata NOT DETECTED NOT DETECTED Final   Candida krusei NOT DETECTED NOT DETECTED Final   Candida parapsilosis NOT DETECTED NOT DETECTED Final   Candida tropicalis NOT DETECTED NOT DETECTED Final   Cryptococcus neoformans/gattii NOT DETECTED NOT DETECTED Final   CTX-M ESBL DETECTED (A) NOT DETECTED Final    Comment: CRITICAL RESULT CALLED TO, READ BACK BY AND VERIFIED WITH: PHARMD ANH PHAM ON 03/30/23 @ 1818 BY DRT (NOTE) Extended spectrum beta-lactamase detected. Recommend a carbapenem as initial therapy.      Carbapenem resistance IMP NOT DETECTED NOT DETECTED Final   Carbapenem resistance KPC NOT DETECTED NOT DETECTED Final   Carbapenem resistance NDM NOT DETECTED NOT DETECTED Final   Carbapenem resist OXA 48 LIKE NOT DETECTED NOT DETECTED Final   Carbapenem resistance VIM NOT DETECTED NOT DETECTED Final    Comment: Performed at [facility], [address], [place], [place] [address]  Culture, blood (Routine X 2) w Reflex to ID Panel     Status: None   Collection Time: 04/01/23  9:06 AM   Specimen: BLOOD RIGHT ARM  Result Value Ref Range Status   Specimen Description   Final    BLOOD RIGHT ARM Performed at [facility], [address], [place], [place] [address]    Special Requests   Final    BOTTLES DRAWN AEROBIC AND ANAEROBIC Blood Culture results may not be optimal due to  an inadequate volume of blood received in culture bottles Performed at [facility], [address] [place], [place] [address]    Culture   Final    NO GROWTH 5 DAYS Performed at [facility]  Lab, [address], [place], [place] [address]    Report Status 04/06/2023 FINAL  Final  Culture, blood (Routine X 2) w Reflex to ID Panel     Status: None   Collection Time: 04/01/23  9:06 AM   Specimen: BLOOD LEFT ARM  Result Value Ref Range Status   Specimen Description   Final    BLOOD LEFT ARM Performed at [facility], [address], [place], [place] [address]    Special Requests   Final    BOTTLES DRAWN AEROBIC AND ANAEROBIC Blood Culture results may not be optimal due to an inadequate volume of blood received in culture bottles Performed at [facility], [address] [place], [place] [address]    Culture   Final    NO GROWTH 5 DAYS Performed at [facility], [address], [place], [place] [address]    Report Status 04/06/2023 FINAL  Final    Labs: CBC: Recent Labs  Lab 04/01/23 0706 04/02/23 0632 04/03/23 0600 04/04/23 0604 04/05/23 0607  WBC 14.0* 12.6* 15.9* 14.5* 15.0*  NEUTROABS 11.2*  --   --   --   --   HGB 12.2 12.6 13.5 13.3 13.0  HCT 36.5 39.0 41.4 42.1 39.8  MCV 95.1 94.4 94.3 97.7 95.4  PLT 183 220 256 279 285   Basic Metabolic Panel: Recent Labs  Lab 04/01/23 0706 04/02/23 0632 04/03/23 0600 04/04/23 0604 04/05/23 0607  NA 137 140 138 136 138  K 4.5 3.3* 3.8 3.9 4.0  CL 112* 111 110 104 105  CO2 17* 20* 21* 22 22  GLUCOSE 88 88 127* 95 91  BUN 15 11 10 9 10   CREATININE 0.64 0.51 0.46 0.52 0.60  CALCIUM 7.4* 7.6* 8.0* 7.8* 8.1*  MG 2.3 2.0  --   --  1.8   Liver Function Tests: No results for input(s): "AST", "ALT", "ALKPHOS", "BILITOT", "PROT", "ALBUMIN" in the last 168 hours. CBG: Recent Labs  Lab 04/03/23 1140  GLUCAP 116*    Discharge time spent: greater than 30  minutes.  Signed: [name], MD [facility] 04/07/2023

## 2023-04-07 NOTE — TOC Transition Note (Addendum)
Transition of Care [facility]) - Discharge Note   Patient Details  Name: [name] MRN: [id number] Date of Birth: 09/13/1934  [facility] CM/SW Contact:  Otelia Santee, LCSW Phone Number: 04/07/2023, 1:31 PM   Clinical Narrative:    Pt to return to SNF at [facility]. Pt will be going to room 207. RN to call report to [phone]. Spoke with pt's daughter and confirmed discharge plans. DC packet with signed script and DNR placed at RN station. PTAR called at 2:09pm for transportation.    Final next level of care: Skilled Nursing Facility Barriers to Discharge: Barriers Resolved   Patient Goals and CMS Choice Patient states their goals for this hospitalization and ongoing recovery are:: For pt to return to [facility].gov Compare Post Acute Care list provided to:: Patient Represenative (must comment) Choice offered to / list presented to : [facility] ownership interest in [facility].provided to::  (NA)    Discharge Placement   Existing PASRR number confirmed : 03/31/23          Patient chooses bed at: [facility] Patient to be transferred to facility by: PT[facility] Name of family member notified: Daughter Patient and family notified of of transfer: 04/07/23  Discharge Plan and Services Additional resources added to the After Visit Summary for   In-house Referral: Clinical Social Work Discharge Planning Services: NA Post Acute Care Choice: Resumption of Svcs/PTA Provider, Nursing Home, Skilled Nursing Facility          DME Arranged: N/A DME Agency: NA       HH Arranged: PT, OT HH Agency: Other - See comment Office manager)     Representative spoke with at [facility] Agency: [place]  Social Drivers of Health (SDOH) Interventions SDOH Screenings   Food Insecurity: No Food Insecurity (03/31/2023)  Housing: Low Risk  (03/31/2023)  Transportation Needs: No Transportation Needs (03/31/2023)  Utilities: Not At Risk (03/30/2023)  Social  Connections: Patient Unable To Answer (03/31/2023)  Tobacco Use: Low Risk  (03/30/2023)     Readmission Risk Interventions    04/07/2023    1:30 PM 03/31/2023   11:08 AM  Readmission Risk Prevention Plan  Transportation Screening Complete Complete  PCP or Specialist Appt within 3-5 Days Complete   HRI or Home Care Consult Complete   Social Work Consult for Recovery Care Planning/Counseling Complete   Palliative Care Screening Not Applicable   Medication Review [employer]) Complete Complete  PCP or Specialist appointment within 3-5 days of discharge  Complete  HRI or Home Care Consult  Complete  SW Recovery Care/Counseling Consult  Complete  Palliative Care Screening  Complete  Skilled Nursing Facility  Complete

## 2023-04-07 NOTE — Progress Notes (Signed)
Mobility Specialist - Progress Note   04/07/23 1039  Mobility  Activity Transferred from bed to chair  Level of Assistance +2 (takes two people)  Assistive Device [employer]/Exercises Active Assistive  Activity Response Tolerated fair  Mobility Referral Yes  Mobility visit 1 Mobility  Mobility Specialist Start Time (ACUTE ONLY) 1029  Mobility Specialist Stop Time (ACUTE ONLY) 1039  Mobility Specialist Time Calculation (min) (ACUTE ONLY) 10 min   Pt was found in bed and agreeable to transfer. NT in room and assisted with transfer. Stated feeling weak. At EOS was left on recliner chair with all needs met. RN and NT in room.  Billey Chang Mobility Specialist

## 2023-04-07 NOTE — Plan of Care (Signed)

## 2023-04-27 DIAGNOSIS — E039 Hypothyroidism, unspecified: Secondary | ICD-10-CM | POA: Diagnosis not present

## 2023-04-27 DIAGNOSIS — J449 Chronic obstructive pulmonary disease, unspecified: Secondary | ICD-10-CM | POA: Diagnosis not present

## 2023-04-27 DIAGNOSIS — I480 Paroxysmal atrial fibrillation: Secondary | ICD-10-CM | POA: Diagnosis not present

## 2023-04-27 DIAGNOSIS — I5032 Chronic diastolic (congestive) heart failure: Secondary | ICD-10-CM | POA: Diagnosis not present

## 2023-04-29 ENCOUNTER — Inpatient Hospital Stay (HOSPITAL_COMMUNITY)
Admission: EM | Admit: 2023-04-29 | Discharge: 2023-05-04 | DRG: 690 | Disposition: A | Discharge status: Skilled Nursing Facility | Source: Skilled Nursing Facility | Attending: Family Medicine | Admitting: Family Medicine

## 2023-04-29 ENCOUNTER — Emergency Department (HOSPITAL_COMMUNITY)

## 2023-04-29 ENCOUNTER — Encounter (HOSPITAL_COMMUNITY): Payer: Self-pay

## 2023-04-29 ENCOUNTER — Other Ambulatory Visit: Payer: Self-pay

## 2023-04-29 DIAGNOSIS — Z801 Family history of malignant neoplasm of trachea, bronchus and lung: Secondary | ICD-10-CM

## 2023-04-29 DIAGNOSIS — Z7901 Long term (current) use of anticoagulants: Secondary | ICD-10-CM

## 2023-04-29 DIAGNOSIS — R0902 Hypoxemia: Secondary | ICD-10-CM | POA: Diagnosis present

## 2023-04-29 DIAGNOSIS — E876 Hypokalemia: Secondary | ICD-10-CM | POA: Diagnosis present

## 2023-04-29 DIAGNOSIS — D869 Sarcoidosis, unspecified: Secondary | ICD-10-CM | POA: Diagnosis present

## 2023-04-29 DIAGNOSIS — N10 Acute tubulo-interstitial nephritis: Secondary | ICD-10-CM | POA: Diagnosis not present

## 2023-04-29 DIAGNOSIS — Z8249 Family history of ischemic heart disease and other diseases of the circulatory system: Secondary | ICD-10-CM | POA: Diagnosis not present

## 2023-04-29 DIAGNOSIS — E860 Dehydration: Secondary | ICD-10-CM | POA: Diagnosis present

## 2023-04-29 DIAGNOSIS — N12 Tubulo-interstitial nephritis, not specified as acute or chronic: Principal | ICD-10-CM | POA: Diagnosis present

## 2023-04-29 DIAGNOSIS — Z79890 Hormone replacement therapy (postmenopausal): Secondary | ICD-10-CM

## 2023-04-29 DIAGNOSIS — D860 Sarcoidosis of lung: Secondary | ICD-10-CM | POA: Diagnosis present

## 2023-04-29 DIAGNOSIS — B9620 Unspecified Escherichia coli [E. coli] as the cause of diseases classified elsewhere: Principal | ICD-10-CM | POA: Diagnosis present

## 2023-04-29 DIAGNOSIS — Z1152 Encounter for screening for COVID-19: Secondary | ICD-10-CM

## 2023-04-29 DIAGNOSIS — Z66 Do not resuscitate: Secondary | ICD-10-CM | POA: Diagnosis present

## 2023-04-29 DIAGNOSIS — N390 Urinary tract infection, site not specified: Secondary | ICD-10-CM | POA: Insufficient documentation

## 2023-04-29 DIAGNOSIS — R0600 Dyspnea, unspecified: Secondary | ICD-10-CM

## 2023-04-29 DIAGNOSIS — Z96612 Presence of left artificial shoulder joint: Secondary | ICD-10-CM | POA: Diagnosis present

## 2023-04-29 DIAGNOSIS — I10 Essential (primary) hypertension: Secondary | ICD-10-CM | POA: Diagnosis present

## 2023-04-29 DIAGNOSIS — I480 Paroxysmal atrial fibrillation: Secondary | ICD-10-CM | POA: Diagnosis present

## 2023-04-29 DIAGNOSIS — E039 Hypothyroidism, unspecified: Secondary | ICD-10-CM | POA: Insufficient documentation

## 2023-04-29 DIAGNOSIS — J189 Pneumonia, unspecified organism: Secondary | ICD-10-CM

## 2023-04-29 DIAGNOSIS — I2510 Atherosclerotic heart disease of native coronary artery without angina pectoris: Secondary | ICD-10-CM | POA: Diagnosis present

## 2023-04-29 DIAGNOSIS — J181 Lobar pneumonia, unspecified organism: Secondary | ICD-10-CM | POA: Diagnosis present

## 2023-04-29 DIAGNOSIS — Z87442 Personal history of urinary calculi: Secondary | ICD-10-CM

## 2023-04-29 DIAGNOSIS — E785 Hyperlipidemia, unspecified: Secondary | ICD-10-CM | POA: Diagnosis present

## 2023-04-29 DIAGNOSIS — F03918 Unspecified dementia, unspecified severity, with other behavioral disturbance: Secondary | ICD-10-CM | POA: Insufficient documentation

## 2023-04-29 DIAGNOSIS — J449 Chronic obstructive pulmonary disease, unspecified: Secondary | ICD-10-CM | POA: Diagnosis present

## 2023-04-29 DIAGNOSIS — J9601 Acute respiratory failure with hypoxia: Secondary | ICD-10-CM

## 2023-04-29 DIAGNOSIS — K219 Gastro-esophageal reflux disease without esophagitis: Secondary | ICD-10-CM | POA: Diagnosis present

## 2023-04-29 DIAGNOSIS — E871 Hypo-osmolality and hyponatremia: Secondary | ICD-10-CM | POA: Diagnosis present

## 2023-04-29 DIAGNOSIS — Z881 Allergy status to other antibiotic agents status: Secondary | ICD-10-CM | POA: Diagnosis not present

## 2023-04-29 DIAGNOSIS — J441 Chronic obstructive pulmonary disease with (acute) exacerbation: Secondary | ICD-10-CM | POA: Diagnosis present

## 2023-04-29 DIAGNOSIS — I110 Hypertensive heart disease with heart failure: Secondary | ICD-10-CM | POA: Diagnosis present

## 2023-04-29 DIAGNOSIS — M810 Age-related osteoporosis without current pathological fracture: Secondary | ICD-10-CM | POA: Diagnosis present

## 2023-04-29 DIAGNOSIS — Z9841 Cataract extraction status, right eye: Secondary | ICD-10-CM

## 2023-04-29 DIAGNOSIS — F0390 Unspecified dementia without behavioral disturbance: Secondary | ICD-10-CM | POA: Diagnosis present

## 2023-04-29 DIAGNOSIS — Z885 Allergy status to narcotic agent status: Secondary | ICD-10-CM

## 2023-04-29 DIAGNOSIS — Z96641 Presence of right artificial hip joint: Secondary | ICD-10-CM | POA: Diagnosis present

## 2023-04-29 DIAGNOSIS — Z800 Family history of malignant neoplasm of digestive organs: Secondary | ICD-10-CM

## 2023-04-29 DIAGNOSIS — Z1612 Extended spectrum beta lactamase (ESBL) resistance: Secondary | ICD-10-CM | POA: Diagnosis not present

## 2023-04-29 DIAGNOSIS — Z20828 Contact with and (suspected) exposure to other viral communicable diseases: Secondary | ICD-10-CM | POA: Diagnosis present

## 2023-04-29 DIAGNOSIS — Z888 Allergy status to other drugs, medicaments and biological substances status: Secondary | ICD-10-CM

## 2023-04-29 DIAGNOSIS — I5032 Chronic diastolic (congestive) heart failure: Secondary | ICD-10-CM | POA: Diagnosis present

## 2023-04-29 DIAGNOSIS — Z79899 Other long term (current) drug therapy: Secondary | ICD-10-CM

## 2023-04-29 DIAGNOSIS — R7881 Bacteremia: Secondary | ICD-10-CM | POA: Diagnosis present

## 2023-04-29 DIAGNOSIS — I509 Heart failure, unspecified: Principal | ICD-10-CM

## 2023-04-29 DIAGNOSIS — Z87440 Personal history of urinary (tract) infections: Secondary | ICD-10-CM

## 2023-04-29 LAB — CBC WITH DIFFERENTIAL/PLATELET
Abs Immature Granulocytes: 0.27 10*3/uL — ABNORMAL HIGH (ref 0.00–0.07)
Basophils Absolute: 0 10*3/uL (ref 0.0–0.1)
Basophils Relative: 0 %
Eosinophils Absolute: 0 10*3/uL (ref 0.0–0.5)
Eosinophils Relative: 0 %
HCT: 36.9 % (ref 36.0–46.0)
Hemoglobin: 11.9 g/dL — ABNORMAL LOW (ref 12.0–15.0)
Immature Granulocytes: 1 %
Lymphocytes Relative: 2 %
Lymphs Abs: 0.6 10*3/uL — ABNORMAL LOW (ref 0.7–4.0)
MCH: 32.2 pg (ref 26.0–34.0)
MCHC: 32.2 g/dL (ref 30.0–36.0)
MCV: 99.7 fL (ref 80.0–100.0)
Monocytes Absolute: 1.3 10*3/uL — ABNORMAL HIGH (ref 0.1–1.0)
Monocytes Relative: 5 %
Neutro Abs: 23.1 10*3/uL — ABNORMAL HIGH (ref 1.7–7.7)
Neutrophils Relative %: 92 %
Platelets: 212 10*3/uL (ref 150–400)
RBC: 3.7 MIL/uL — ABNORMAL LOW (ref 3.87–5.11)
RDW: 14.9 % (ref 11.5–15.5)
WBC: 25.3 10*3/uL — ABNORMAL HIGH (ref 4.0–10.5)
nRBC: 0 % (ref 0.0–0.2)

## 2023-04-29 LAB — COMPREHENSIVE METABOLIC PANEL
ALT: 14 U/L (ref 0–44)
AST: 23 U/L (ref 15–41)
Albumin: 3.1 g/dL — ABNORMAL LOW (ref 3.5–5.0)
Alkaline Phosphatase: 92 U/L (ref 38–126)
Anion gap: 12 (ref 5–15)
BUN: 12 mg/dL (ref 8–23)
CO2: 20 mmol/L — ABNORMAL LOW (ref 22–32)
Calcium: 8.2 mg/dL — ABNORMAL LOW (ref 8.9–10.3)
Chloride: 101 mmol/L (ref 98–111)
Creatinine, Ser: 0.71 mg/dL (ref 0.44–1.00)
GFR, Estimated: >60 mL/min (ref >60)
Glucose, Bld: 160 mg/dL — ABNORMAL HIGH (ref 70–99)
Potassium: 3.9 mmol/L (ref 3.5–5.1)
Sodium: 133 mmol/L — ABNORMAL LOW (ref 135–145)
Total Bilirubin: 0.8 mg/dL (ref 0.0–1.2)
Total Protein: 6 g/dL — ABNORMAL LOW (ref 6.5–8.1)

## 2023-04-29 LAB — BRAIN NATRIURETIC PEPTIDE: B Natriuretic Peptide: 598.8 pg/mL — ABNORMAL HIGH (ref 0.0–100.0)

## 2023-04-29 LAB — RESP PANEL BY RT-PCR (RSV, FLU A&B, COVID)  RVPGX2
Influenza A by PCR: NEGATIVE
Influenza B by PCR: NEGATIVE
Resp Syncytial Virus by PCR: NEGATIVE
SARS Coronavirus 2 by RT PCR: NEGATIVE

## 2023-04-29 LAB — LACTIC ACID, PLASMA
Lactic Acid, Venous: 1.7 mmol/L (ref 0.5–1.9)
Lactic Acid, Venous: 1.8 mmol/L (ref 0.5–1.9)

## 2023-04-29 MED ORDER — OXYCODONE HCL 5 MG PO TABS: 5.0000 mg | ORAL_TABLET | Freq: Four times a day (QID) | ORAL | Status: DC | PRN

## 2023-04-29 MED ORDER — THIAMINE MONONITRATE 100 MG PO TABS
100.0000 mg | ORAL_TABLET | Freq: Every day | ORAL | Status: DC
  Administered 2023-04-30 – 2023-05-04 (×5): 100 mg via ORAL
  Filled 2023-04-29 (×5): qty 1

## 2023-04-29 MED ORDER — APIXABAN 2.5 MG PO TABS
2.5000 mg | ORAL_TABLET | Freq: Two times a day (BID) | ORAL | Status: DC
  Administered 2023-04-29 – 2023-05-04 (×10): 2.5 mg via ORAL
  Filled 2023-04-29 (×10): qty 1

## 2023-04-29 MED ORDER — LEVOTHYROXINE SODIUM 100 MCG PO TABS
100.0000 ug | ORAL_TABLET | Freq: Every day | ORAL | Status: DC
  Administered 2023-04-30 – 2023-05-04 (×5): 100 ug via ORAL
  Filled 2023-04-29 (×5): qty 1

## 2023-04-29 MED ORDER — FUROSEMIDE 10 MG/ML IJ SOLN
40.0000 mg | Freq: Every day | INTRAMUSCULAR | Status: DC
  Administered 2023-04-29: 40 mg via INTRAVENOUS
  Filled 2023-04-29: qty 4

## 2023-04-29 MED ORDER — SODIUM CHLORIDE 0.9 % IV SOLN
2.0000 g | INTRAVENOUS | Status: AC
  Administered 2023-04-29: 2 g via INTRAVENOUS
  Filled 2023-04-29: qty 20

## 2023-04-29 MED ORDER — SODIUM CHLORIDE 0.9 % IV SOLN
1.0000 g | INTRAVENOUS | Status: DC
  Administered 2023-04-30: 1 g via INTRAVENOUS
  Filled 2023-04-29: qty 10

## 2023-04-29 MED ORDER — FUROSEMIDE 10 MG/ML IJ SOLN
40.0000 mg | INTRAMUSCULAR | Status: AC
  Administered 2023-04-29: 40 mg via INTRAVENOUS
  Filled 2023-04-29: qty 4

## 2023-04-29 MED ORDER — GUAIFENESIN ER 600 MG PO TB12
1200.0000 mg | ORAL_TABLET | Freq: Two times a day (BID) | ORAL | Status: DC
  Administered 2023-04-29 – 2023-05-04 (×10): 1200 mg via ORAL
  Filled 2023-04-29 (×10): qty 2

## 2023-04-29 MED ORDER — SODIUM CHLORIDE 0.9 % IV SOLN: 250.0000 mL | INTRAVENOUS | Status: AC | PRN

## 2023-04-29 MED ORDER — ACETAMINOPHEN 325 MG PO TABS
650.0000 mg | ORAL_TABLET | ORAL | Status: DC | PRN
  Administered 2023-04-29: 650 mg via ORAL
  Filled 2023-04-29: qty 2

## 2023-04-29 MED ORDER — SODIUM CHLORIDE 0.9 % IV SOLN
100.0000 mg | Freq: Two times a day (BID) | INTRAVENOUS | Status: DC
  Administered 2023-04-30: 100 mg via INTRAVENOUS
  Filled 2023-04-29 (×2): qty 100

## 2023-04-29 MED ORDER — SODIUM CHLORIDE 0.9% FLUSH: 3.0000 mL | INTRAVENOUS | Status: DC | PRN

## 2023-04-29 MED ORDER — FERROUS SULFATE 325 (65 FE) MG PO TABS
325.0000 mg | ORAL_TABLET | ORAL | Status: DC
Start: 2023-04-30 — End: 2023-05-04
  Administered 2023-04-30 – 2023-05-04 (×3): 325 mg via ORAL
  Filled 2023-04-29 (×3): qty 1

## 2023-04-29 MED ORDER — METOCLOPRAMIDE HCL 10 MG PO TABS
5.0000 mg | ORAL_TABLET | Freq: Three times a day (TID) | ORAL | Status: DC | PRN
  Administered 2023-04-30 – 2023-05-04 (×4): 5 mg via ORAL
  Filled 2023-04-29 (×4): qty 1

## 2023-04-29 MED ORDER — IPRATROPIUM-ALBUTEROL 0.5-2.5 (3) MG/3ML IN SOLN: 3.0000 mL | Freq: Four times a day (QID) | RESPIRATORY_TRACT | Status: DC | PRN

## 2023-04-29 MED ORDER — SODIUM CHLORIDE 0.9 % IV SOLN
500.0000 mg | Freq: Once | INTRAVENOUS | Status: AC
  Administered 2023-04-29: 500 mg via INTRAVENOUS
  Filled 2023-04-29: qty 5

## 2023-04-29 MED ORDER — VITAMIN C 500 MG PO TABS
500.0000 mg | ORAL_TABLET | Freq: Every day | ORAL | Status: DC
  Administered 2023-04-30 – 2023-05-04 (×5): 500 mg via ORAL
  Filled 2023-04-29 (×5): qty 1

## 2023-04-29 MED ORDER — SODIUM CHLORIDE 0.9% FLUSH
3.0000 mL | Freq: Two times a day (BID) | INTRAVENOUS | Status: DC
  Administered 2023-04-29 – 2023-05-04 (×9): 3 mL via INTRAVENOUS

## 2023-04-29 MED ORDER — ONDANSETRON HCL 4 MG/2ML IJ SOLN
4.0000 mg | Freq: Four times a day (QID) | INTRAMUSCULAR | Status: DC | PRN
  Administered 2023-04-29 – 2023-05-04 (×9): 4 mg via INTRAVENOUS
  Filled 2023-04-29 (×9): qty 2

## 2023-04-29 NOTE — ED Triage Notes (Signed)
 Pt bib [facility] for increased weakness, thirst and SOB.

## 2023-04-29 NOTE — ED Provider Notes (Signed)
 [place] EMERGENCY DEPARTMENT AT [facility] Provider Note   CSN: [id number] Arrival date & time: 04/29/23  1559     History {Add pertinent medical, surgical, social history, OB history to HPI:1} Chief Complaint  Patient presents with   Shortness of Breath    [name] is a 89 y.o. female.  89 year old female with a history of COPD, pulmonary sarcoid, atrial fibrillation on Eliquis, and CHF who presents emergency department with shortness of breath.  Patient was at [facility] facility [facility] when started having a sore throat over the past few days.  Today started having a cough.  She appeared to be somewhat distressed saying that she was having some difficulty breathing and her oxygen levels got as low as 79%.  They decided to bring her into the emergency department for evaluation.  No known sick contacts.  No fevers.  No chest pain.       Home Medications Prior to Admission medications   Medication Sig Start Date End Date Taking? Authorizing Provider  acetaminophen (TYLENOL) 325 MG tablet Take 650 mg by mouth stat. For fever    [provider]  acetaminophen (TYLENOL) 500 MG tablet Take 1,000 mg by mouth every 6 (six) hours as needed (for pain).    [provider]  apixaban (ELIQUIS) 2.5 MG TABS tablet Take 2.5 mg by mouth 2 (two) times daily.    [provider]  ascorbic acid (VITAMIN C) 500 MG tablet Take 500 mg by mouth daily.    [provider]  Budeson-Glycopyrrol-Formoterol (BREZTRI AEROSPHERE) 160-9-4.8 MCG/ACT AERO Inhale 2 puffs into the lungs in the morning and at bedtime. 09/29/21   Leslye Peer, MD  Calcium Carbonate Antacid (TUMS ULTRA 1000 PO) Take 1,000 mg by mouth 2 (two) times daily.    [provider]  ferrous sulfate 325 (65 FE) MG tablet Take 325 mg by mouth every other day. Take with orange juice when possible    [provider]  furosemide (LASIX) 20 MG tablet Take 1 tablet (20 mg total)  by mouth daily. 07/20/22   Briant Cedar, MD  guaiFENesin (MUCINEX) 600 MG 12 hr tablet Take 2 tablets (1,200 mg total) by mouth 2 (two) times daily. 03/18/22   Glade Lloyd, MD  ipratropium-albuterol (DUONEB) 0.5-2.5 (3) MG/3ML SOLN Take 3 mLs by nebulization every 6 (six) hours as needed (wheezing; shortness of breath). 10/22/21   Cobb, Ruby Cola, NP  levothyroxine (SYNTHROID) 50 MCG tablet Take 100 mcg by mouth daily. 11/25/22   [provider]  Melatonin 5 MG CHEW Chew 5 mg by mouth at bedtime.    [provider]  Menthol, Topical Analgesic, 4 % GEL Apply 1 Dose/kg topically every 6 (six) hours as needed (arthritic pain control).    [provider]  methenamine (HIPREX) 1 g tablet Take 1 g by mouth daily. 02/23/23   [provider]  metoCLOPramide (REGLAN) 5 MG tablet Take 1 tablet (5 mg total) by mouth every 8 (eight) hours as needed for nausea or vomiting. 04/07/23 04/06/24  [name], MD  omeprazole (PRILOSEC) 40 MG capsule Take 40 mg by mouth in the morning and at bedtime.    [provider]  ondansetron (ZOFRAN) 4 MG tablet Take 4 mg by mouth every 6 (six) hours as needed for nausea or vomiting.    [provider]  oxybutynin (DITROPAN) 5 MG tablet Take 5 mg by mouth daily. 11/25/22   [provider]  oxyCODONE (  OXY IR/ROXICODONE) 5 MG immediate release tablet Take 1 tablet (5 mg total) by mouth every 6 (six) hours as needed for severe pain (pain score 7-10). 04/07/23   [name], MD  polyethylene glycol (MIRALAX / GLYCOLAX) 17 g packet Take 17 g by mouth daily as needed for mild constipation.    [provider]  PROPYLENE GLYCOL OP Place 2 drops into both eyes 2 (two) times daily.    [provider]  saccharomyces boulardii (FLORASTOR) 250 MG capsule Take 1 capsule (250 mg total) by mouth 2 (two) times daily. 04/07/23   [name], MD  senna-docusate (SENOKOT-S) 8.6-50 MG tablet Take 1 tablet by mouth  at bedtime as needed for mild constipation. 04/07/23   [name], MD  sertraline (ZOLOFT) 50 MG tablet Take 1 tablet (50 mg total) by mouth at bedtime. 12/23/21   Anson Fret, MD  sodium chloride (OCEAN) 0.65 % SOLN nasal spray Place 1 spray into both nostrils 2 (two) times daily.    [provider]  thiamine (VITAMIN B-1) 100 MG tablet Take 1 tablet (100 mg total) by mouth daily. 04/08/23   [name], MD      Allergies    Levaquin [levofloxacin], Oxycodone, Oxycodone-acetaminophen, Sulfonamide derivatives, Sulfa antibiotics, and Morphine    Review of Systems   Review of Systems  Physical Exam Updated Vital Signs BP (!) 125/45   Pulse 85   Temp 98.1 F (36.7 C) (Oral)   Resp 15   Ht 5\' 3"  (1.6 m)   Wt 54 kg   LMP 02/22/1983 (Approximate)   SpO2 92%   BMI 21.09 kg/m  Physical Exam Vitals and nursing note reviewed.  Constitutional:      General: She is not in acute distress.    Appearance: She is well-developed.  HENT:     Head: Normocephalic and atraumatic.     Right Ear: External ear normal.     Left Ear: External ear normal.     Nose: Nose normal.  Eyes:     Extraocular Movements: Extraocular movements intact.     Conjunctiva/sclera: Conjunctivae normal.     Pupils: Pupils are equal, round, and reactive to light.  Cardiovascular:     Rate and Rhythm: Normal rate and regular rhythm.     Heart sounds: No murmur heard. Pulmonary:     Effort: Pulmonary effort is normal. No respiratory distress.     Breath sounds: Normal breath sounds.  Musculoskeletal:     Cervical back: Normal range of motion and neck supple.     Right lower leg: No edema.     Left lower leg: No edema.  Skin:    General: Skin is warm and dry.  Neurological:     Mental Status: She is alert and oriented to person, place, and time. Mental status is at baseline.  Psychiatric:        Mood and Affect: Mood normal.     ED Results / Procedures / Treatments   Labs (all labs ordered  are listed, but only abnormal results are displayed) Labs Reviewed  RESP PANEL BY RT-PCR (RSV, FLU A&B, COVID)  [id number]  COMPREHENSIVE METABOLIC PANEL  CBC WITH DIFFERENTIAL/PLATELET  BRAIN NATRIURETIC PEPTIDE    EKG None  Radiology No results found.  Procedures Procedures  {Document cardiac monitor, telemetry assessment procedure when appropriate:1}  Medications Ordered in ED Medications - No data to display  ED Course/ Medical Decision Making/ A&P   {  Click here for ABCD2, HEART and other calculatorsREFRESH Note before signing :1}                              Medical Decision Making Amount and/or Complexity of Data Reviewed Labs: ordered. Radiology: ordered.   ***  {Document critical care time when appropriate:1} {Document review of labs and clinical decision tools ie heart score, Chads2Vasc2 etc:1}  {Document your independent review of radiology images, and any outside records:1} {Document your discussion with family members, caretakers, and with consultants:1} {Document social determinants of health affecting pt's care:1} {Document your decision making why or why not admission, treatments were needed:1} Final Clinical Impression(s) / ED Diagnoses Final diagnoses:  None    Rx / DC Orders ED Discharge Orders     None

## 2023-04-29 NOTE — H&P (Signed)
 History and Physical    Patient: [name]:[id number] DOB: [birth year] DOA: 04/29/2023 DOS: the patient was seen and examined on 04/29/2023 PCP: Sherri Northern, MD  Patient coming from: SNF  Chief Complaint:  Chief Complaint  Patient presents with   Shortness of Breath   HPI: [name] is a 89 y.o. female with medical history significant of COPD, pulmonary sarcoidosis, atrial fibrillation on Eliquis, diastolic CHF, coronary artery disease, seasonal allergies and asthma who presents to the emergency room from her facility with significant shortness of breath and cough.  Patient was having difficulty breathing and oxygen level was noted to be over 79% on room air.  She was brought to the emergency room for evaluation.  Patient denied any chest pain.  Denied any sick contacts.  In the ER patient has evaluation showed mild hyponatremia.  She has significant leukocytosis.  Acute viral screen is negative and chest x-ray showed mild central pulmonary vascular congestion possible bilateral pulmonary edema.  Her BNP is equally mildly elevated at 598.  With leukocytosis respiratory symptoms patient is being treated for mixed CHF exacerbation and pneumonia leading to acute hypoxic respiratory failure.  She is currently on oxygen  Review of Systems: As mentioned in the history of present illness. All other systems reviewed and are negative. Past Medical History:  Diagnosis Date   Acute maxillary sinusitis 10/17/2017   Acute posthemorrhagic anemia 08/22/2012   Acute renal failure syndrome (HCC) 01/04/2019   Allergy    SEASONAL   Anemia    Aortic atherosclerosis (HCC)    Arthritis    Asthma    "slight"    Baker's cyst of knee, right    CAD (coronary artery disease)    Carotid stenosis    Closed fracture of right olecranon process 05/09/2017   Closed left subtrochanteric femur fracture S/P Open/closed and reduction, internal medullary fixation  09/05/2012   Dyspnea    due to Sarcoidosis.  When is windy or humed   Edema 10/30/2019   Elbow fracture 04/2017   Right, had surgery   Esophageal reflux    Fracture of inferior pubic ramus (HCC) 02/28/2019   Hematochezia    Hip fracture (HCC) 12/12/2018   HLD (hyperlipidemia)    Hx of colonoscopy    Hydronephrosis 07/29/2019   Left foot pain 09/02/2014   Assessment: 89 year old with acquired leg length discrepancy on the left who presents with 10 days of acute onset left foot pain. She seemed to be doing well prior to this pain with her orthotics that had been made to correct her leg length discrepancy. Negative x-rays at outside office which were again reviewed today.   This pain does not seem to impair her function and which has actually been im   Macular pucker, right eye    will have removed on 01/26/21   Osteoporosis    Paroxysmal supraventricular tachycardia (HCC)    Pneumonia    Pyelonephritis 07/29/2019   Sacral insufficiency fracture 12/12/2018   Sarcoidosis    Sepsis (HCC) 07/30/2019   Sepsis due to Escherichia coli (e. 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   COLONOSCOPY     CYSTOSCOPY W/ RETROGRADES Left 07/30/2019   Procedure: CYSTOSCOPY WITH RETROGRADE PYELOGRAM LEFT STENT PLACEMENT;  Surgeon: Sherri Fat, MD;  Location: [facility] ORS;  Service: Urology;  Laterality: Left;   CYSTOSCOPY WITH RETROGRADE PYELOGRAM, URETEROSCOPY AND STENT PLACEMENT Left 08/20/2019   Procedure: CYSTOSCOPY, URETEROSCOPY AND STENT PLACEMENT;  Surgeon: Noel Christmas, MD;  Location: [facility];  Service: Urology;   Laterality: Left;  1 HR   FOOT SURGERY Right    HIP SURGERY Right 2011   full replacement   HOLMIUM LASER APPLICATION Left 08/20/2019   Procedure: HOLMIUM LASER APPLICATION;  Surgeon: Noel Christmas, MD;  Location: [facility];  Service: Urology;  Laterality: Left;   LEFT HEART CATH AND CORONARY ANGIOGRAPHY N/A 08/09/2021   Procedure: LEFT HEART CATH AND CORONARY ANGIOGRAPHY;  Surgeon: Tonny Bollman, MD;  Location: [facility] INVASIVE CV LAB;  Service: Cardiovascular;  Laterality: N/A;   LEG SURGERY Left 06/2012   femur fracture s/p open and closed reduction in [place], Dr. Mayford Knife   lens replacement Right    right eye   ORIF ELBOW FRACTURE Right 05/09/2017   Procedure: ORIF right olecranon fracture with repair/reconstruction, ulnar nerve transposition as needed;  Surgeon: Sherri Severin, MD;  Location: [facility] OR;  Service: Orthopedics;  Laterality: Right;  Requests 90 mins   pelvis fracture     POLYPECTOMY     REVERSE SHOULDER ARTHROPLASTY Left 06/13/2019   Procedure: REVERSE SHOULDER ARTHROPLASTY;  Surgeon: Sherri Hanly, MD;  Location: [facility];  Service: Orthopedics;  Laterality: Left;    SHOULDER ARTHROSCOPY W/ ROTATOR CUFF REPAIR Right    TRAPEZIUM RESECTION Right    Social History:  reports that she has never smoked. She has never used smokeless tobacco. She reports that she does not drink alcohol and does not use drugs.  Allergies  Allergen Reactions   Levaquin [Levofloxacin] Other (See Comments)    Joint pain.. Per doctor, told to not to take again and "allergic," per [facility]   Oxycodone Nausea And Vomiting and Other (See Comments)    Can take oxycodone with Zofran (otherwise, GI Intolerance)   Oxycodone-Acetaminophen Other (See Comments)    GI Intolerance and "talking out of my head" and "allergic," per [facility]   Sulfonamide Derivatives Other (See Comments)    Possibly caused hepatitis and "allergic," per [facility]   Sulfa Antibiotics Other (See Comments)    Possibly caused hepatitis and  "allergic," per [facility]   Morphine Nausea Only and Other (See Comments)    GI intolerance and "allergic," per [facility]    Family History  Problem Relation Age of Onset   Colon cancer Mother 23   Cancer Mother    Lung cancer Father    Heart disease Father    Arrhythmia Father    Cancer Father    Cancer Sister        ovarian   Heart attack Brother    Esophageal cancer Neg Hx    Rectal cancer Neg Hx    Stomach cancer Neg Hx    Stroke Neg Hx     Prior to Admission medications   Medication Sig Start Date End Date Taking? Authorizing Provider  acetaminophen (TYLENOL) 325 MG tablet Take 650 mg by mouth stat. For fever    [provider]  acetaminophen (TYLENOL) 500 MG tablet Take 1,000 mg by mouth every 6 (six) hours as needed (for pain).    [provider]  apixaban (ELIQUIS) 2.5 MG TABS tablet Take 2.5 mg by mouth 2 (two) times daily.    [provider]  ascorbic acid (VITAMIN C) 500 MG tablet Take 500 mg by mouth daily.    [provider]  Budeson-Glycopyrrol-Formoterol (BREZTRI AEROSPHERE) 160-9-4.8 MCG/ACT AERO Inhale 2 puffs into the lungs in the morning and at bedtime. 09/29/21   Leslye Peer, MD  Calcium Carbonate Antacid (TUMS ULTRA 1000 PO) Take 1,000 mg by mouth 2 (two) times daily.    [provider]  ferrous sulfate 325 (65 FE) MG tablet Take 325 mg by mouth every other day. Take with orange juice when possible    [provider]  furosemide (LASIX) 20 MG tablet Take 1 tablet (20 mg total) by mouth daily. 07/20/22   Briant Cedar, MD  guaiFENesin (MUCINEX) 600 MG 12 hr tablet Take 2 tablets (1,200 mg total) by mouth 2 (two) times daily. 03/18/22   Glade Lloyd, MD  ipratropium-albuterol (DUONEB) 0.5-2.5 (3) MG/3ML SOLN Take 3 mLs by nebulization every 6 (six) hours as needed (wheezing; shortness of breath). 10/22/21   Cobb, Ruby Cola, NP  levothyroxine (SYNTHROID) 50 MCG tablet Take 100 mcg by mouth daily. 11/25/22    [provider]  Melatonin 5 MG CHEW Chew 5 mg by mouth at bedtime.    [provider]  Menthol, Topical Analgesic, 4 % GEL Apply 1 Dose/kg topically every 6 (six) hours as needed (arthritic pain control).    [provider]  methenamine (HIPREX) 1 g tablet Take 1 g by mouth daily. 02/23/23   [provider]  metoCLOPramide (REGLAN) 5 MG tablet Take 1 tablet (5 mg total) by mouth every 8 (eight) hours as needed for nausea or vomiting. 04/07/23 04/06/24  [name], MD  omeprazole (PRILOSEC) 40 MG capsule Take 40 mg by mouth in the morning and at bedtime.    [provider]  ondansetron (ZOFRAN) 4 MG tablet Take 4 mg by mouth every 6 (six) hours as needed for nausea or vomiting.    [provider]  oxybutynin (DITROPAN) 5 MG tablet Take 5 mg by mouth daily. 11/25/22   [provider]  oxyCODONE (OXY IR/ROXICODONE) 5 MG immediate release tablet Take 1 tablet (5 mg total) by mouth every 6 (six) hours as needed for severe pain (pain score 7-10). 04/07/23   [name], MD  polyethylene glycol (MIRALAX / GLYCOLAX) 17 g packet Take 17 g by mouth daily as needed for mild constipation.    [provider]  PROPYLENE GLYCOL OP Place 2 drops into both eyes 2 (two) times daily.    [provider]  saccharomyces boulardii (FLORASTOR) 250 MG capsule Take 1 capsule (250 mg total) by mouth 2 (two) times daily. 04/07/23   [name], MD  senna-docusate (SENOKOT-S) 8.6-50 MG tablet Take 1 tablet by mouth at bedtime as needed for mild constipation. 04/07/23   [name], MD  sertraline (ZOLOFT) 50 MG tablet Take 1 tablet (50 mg total) by mouth at bedtime. 12/23/21   Anson Fret, MD  sodium chloride (OCEAN) 0.65 % SOLN nasal spray Place 1 spray into both nostrils 2 (two) times daily.    [provider]  thiamine (VITAMIN B-1) 100 MG tablet Take 1 tablet (100 mg total) by mouth daily. 04/08/23   [name], MD    Physical  Exam: Vitals:   04/29/23 1604 04/29/23 1607 04/29/23 1700  BP: (!) 125/45  Marland Kitchen)  106/45  Pulse: 85  80  Resp: 15  (!) 23  Temp: 98.1 F (36.7 C)    TempSrc: Oral    SpO2: 92%  92%  Weight:  54 kg   Height:  5\' 3"  (1.6 m)    Constitutional: Acutely ill looking no distress NAD, calm, comfortable Eyes: PERRL, lids and conjunctivae normal ENMT: Mucous membranes are moist. Posterior pharynx clear of any exudate or lesions.Normal dentition.  Neck: normal, supple, no masses, no thyromegaly Respiratory: Coarse breath sounds, no wheezing, no crackles. Normal respiratory effort. No accessory muscle use.  Cardiovascular: Sinus tachycardia, no murmurs / rubs / gallops. No extremity edema. 2+ pedal pulses. No carotid bruits.  Abdomen: no tenderness, no masses palpated. No hepatosplenomegaly. Bowel sounds positive.  Musculoskeletal: Good range of motion, no joint swelling or tenderness, Skin: no rashes, lesions, ulcers. No induration Neurologic: CN 2-12 grossly intact. Sensation intact, DTR normal. Strength 5/5 in all 4.  Psychiatric: Normal judgment and insight. Alert and oriented x 3. Normal mood  Data Reviewed:  Temperature 99.3 blood pressure 106/45, respirate of 23 and oxygen sats 92% on 2 L.  White count is 25.3 hemoglobin 11.9 CO2 20 sodium 133 calcium 8.2 and glucose 160.  BNP 598.8.  Chest x-ray showed bilateral infiltrates possible pulmonary edema versus some pleural effusion  Assessment and Plan:  #1 acute hypoxic respiratory failure: Questionable COPD exacerbation, pneumonia and/or CHF exacerbation.  Patient is now requiring oxygen.  She will be admitted to the hospital for evaluation.  Empiric antibiotics while cultures are obtained.  Follow white count.  Patient will likely be diuresed although patient received fluid in the ER that made her feel better.  She has a caregiver with exposure to RSV.  This could be triggered by that.  She does have also remote history of asthma.  No wheezing  at this point so we will hold off on steroids.  Continue to titrate oxygen off.  #2 diastolic CHF history: Patient may have an exacerbation of her CHF.  Last echocardiogram was a year ago.  Will repeat echocardiogram now.  Will consider continue diuresis.  #3 possible pneumonia: Patient has lingula shady area looks like an infiltrate.  Also white count.  Low-grade temperature.  Will therefore empirically treat with antibiotics.  Follow-up closely.  May repeat chest x-ray in a day or 2 to see if pneumonia is more obvious.  #4 essential hypertension: Will resume home regimen  #5 coronary artery disease: Appears compensated.  No chest pain.  Continue home regimen  #6 hyperlipidemia: Continue statin  #7 history of atrial fibrillation: Confirm on resume home regimen.  Rate is controlled at the moment.  A-fib is paroxysmal for the most part.    Advance Care Planning:   Code Status: Do not attempt resuscitation (DNR) PRE-ARREST INTERVENTIONS DESIRED   Consults: None  Family Communication: Daughter  Severity of Illness: The appropriate patient status for this patient is INPATIENT. Inpatient status is judged to be reasonable and necessary in order to provide the required intensity of service to ensure the patient's safety. The patient's presenting symptoms, physical exam findings, and initial radiographic and laboratory data in the context of their chronic comorbidities is felt to place them at high risk for further clinical deterioration. Furthermore, it is not anticipated that the patient will be medically stable for discharge from the hospital within 2 midnights of admission.   * I certify that at the point of admission it is my clinical judgment that the patient will require  inpatient hospital care spanning beyond 2 midnights from the point of admission due to high intensity of service, high risk for further deterioration and high frequency of surveillance required.*  AuthorLonia Blood,  MD 04/29/2023 7:45 PM  For on call review [email or web address].

## 2023-04-30 ENCOUNTER — Inpatient Hospital Stay (HOSPITAL_COMMUNITY)

## 2023-04-30 DIAGNOSIS — I5032 Chronic diastolic (congestive) heart failure: Secondary | ICD-10-CM

## 2023-04-30 DIAGNOSIS — F03918 Unspecified dementia, unspecified severity, with other behavioral disturbance: Secondary | ICD-10-CM | POA: Insufficient documentation

## 2023-04-30 DIAGNOSIS — B9620 Unspecified Escherichia coli [E. coli] as the cause of diseases classified elsewhere: Secondary | ICD-10-CM | POA: Diagnosis not present

## 2023-04-30 DIAGNOSIS — N390 Urinary tract infection, site not specified: Secondary | ICD-10-CM | POA: Insufficient documentation

## 2023-04-30 DIAGNOSIS — R7881 Bacteremia: Secondary | ICD-10-CM

## 2023-04-30 DIAGNOSIS — E039 Hypothyroidism, unspecified: Secondary | ICD-10-CM | POA: Insufficient documentation

## 2023-04-30 LAB — BLOOD CULTURE ID PANEL (REFLEXED) - BCID2

## 2023-04-30 LAB — ECHOCARDIOGRAM COMPLETE
AR max vel: 2.17 cm2
AV Area VTI: 1.87 cm2
AV Area mean vel: 1.85 cm2
AV Mean grad: 5 mmHg
AV Peak grad: 8.4 mmHg
Ao pk vel: 1.45 m/s
Area-P 1/2: 3.17 cm2
Calc EF: 66.3 %
Height: 63 in
MV VTI: 2.05 cm2
S' Lateral: 2.7 cm
Single Plane A2C EF: 64.3 %
Single Plane A4C EF: 70.9 %
Weight: 1904.77 [oz_av]

## 2023-04-30 LAB — URINALYSIS, W/ REFLEX TO CULTURE (INFECTION SUSPECTED)
Bilirubin Urine: NEGATIVE
Glucose, UA: NEGATIVE mg/dL
Hgb urine dipstick: NEGATIVE
Ketones, ur: NEGATIVE mg/dL
Nitrite: NEGATIVE
Protein, ur: NEGATIVE mg/dL
Specific Gravity, Urine: 1.006 (ref 1.005–1.030)
pH: 5 (ref 5.0–8.0)

## 2023-04-30 LAB — BASIC METABOLIC PANEL
Anion gap: 11 (ref 5–15)
BUN: 15 mg/dL (ref 8–23)
CO2: 23 mmol/L (ref 22–32)
Calcium: 8.3 mg/dL — ABNORMAL LOW (ref 8.9–10.3)
Chloride: 103 mmol/L (ref 98–111)
Creatinine, Ser: 0.88 mg/dL (ref 0.44–1.00)
GFR, Estimated: >60 mL/min (ref >60)
Glucose, Bld: 144 mg/dL — ABNORMAL HIGH (ref 70–99)
Potassium: 3 mmol/L — ABNORMAL LOW (ref 3.5–5.1)
Sodium: 137 mmol/L (ref 135–145)

## 2023-04-30 MED ORDER — FUROSEMIDE 20 MG PO TABS: 20.0000 mg | ORAL_TABLET | Freq: Every day | ORAL | Status: DC

## 2023-04-30 MED ORDER — SODIUM CHLORIDE 0.9 % IV SOLN
1.0000 g | Freq: Two times a day (BID) | INTRAVENOUS | Status: DC
  Administered 2023-04-30 – 2023-05-02 (×4): 1 g via INTRAVENOUS
  Filled 2023-04-30 (×4): qty 20

## 2023-04-30 MED ORDER — SERTRALINE HCL 50 MG PO TABS
50.0000 mg | ORAL_TABLET | Freq: Every day | ORAL | Status: DC
  Administered 2023-04-30 – 2023-05-03 (×4): 50 mg via ORAL
  Filled 2023-04-30 (×4): qty 1

## 2023-04-30 MED ORDER — BUDESON-GLYCOPYRROL-FORMOTEROL 160-9-4.8 MCG/ACT IN AERO: 2.0000 | INHALATION_SPRAY | Freq: Two times a day (BID) | RESPIRATORY_TRACT | Status: DC

## 2023-04-30 MED ORDER — FUROSEMIDE 10 MG/ML IJ SOLN
40.0000 mg | Freq: Two times a day (BID) | INTRAMUSCULAR | Status: DC
  Administered 2023-04-30 (×2): 40 mg via INTRAVENOUS
  Filled 2023-04-30 (×2): qty 4

## 2023-04-30 MED ORDER — POTASSIUM CHLORIDE CRYS ER 20 MEQ PO TBCR
40.0000 meq | EXTENDED_RELEASE_TABLET | Freq: Two times a day (BID) | ORAL | Status: AC
Start: 2023-04-30 — End: 2023-04-30
  Administered 2023-04-30 (×2): 40 meq via ORAL
  Filled 2023-04-30 (×2): qty 2

## 2023-04-30 NOTE — Evaluation (Signed)
 Physical Therapy Evaluation Patient Details Name: [name] MRN: [id number] DOB: 1934/10/15 Today's Date: 04/30/2023  History of Present Illness  89 yo female admitted with acute hypoxemic respiratory failure. Hx of COPD, sarcoidosis, Afib, CHF, L rev TSA, R RCR, CAD, sacral insufficiency fx, osteoporosis. Pt is LTC SNF resident.  Clinical Impression  Limited bed level eval. Pt fatigued and perseverating on feeling cold. Pt participated in UE and LE ROM exercises. Daughter was present during room. Pt is LTC SNF resident. Daughter would like pt to return to her facility room with HHPT f/u. Will plan to follow pt during this hospital stay.         If plan is discharge home, recommend the following: Two people to help with walking and/or transfers;Two people to help with bathing/dressing/bathroom;Assistance with cooking/housework;Assistance with feeding   Can travel by private vehicle        Equipment Recommendations None recommended by PT  Recommendations for Other Services       Functional Status Assessment Patient has had a recent decline in their functional status and demonstrates the ability to make significant improvements in function in a reasonable and predictable amount of time.     Precautions / Restrictions Precautions Precautions: Fall Restrictions Weight Bearing Restrictions Per Provider Order: No      Mobility  Bed Mobility               General bed mobility comments: NT-pt fatigued and perseverating on feeling cold when covers removed. Bed level eval only.    Transfers                        Ambulation/Gait                  Stairs            Wheelchair Mobility     Tilt Bed    Modified Rankin (Stroke Patients Only)       Balance                                             Pertinent Vitals/Pain Pain Assessment Pain Assessment: No/denies pain    Home Living Family/patient expects to be  discharged to:: Skilled nursing facility                   Additional Comments: Pt is a Long Term resident at [facility] but remains active with PT/OT per family.    Prior Function Prior Level of Function : History of Falls (last six months)             Mobility Comments: transfers only wtih assistance of 1-2, depending on staff preference, per daughter. ADLs Comments: patient is able to participate in UB ADLs with supet and SBA, but has staff support for LB ADLs. Poor PO intake per family.     Extremity/Trunk Assessment   Upper Extremity Assessment Upper Extremity Assessment: Defer to OT evaluation    Lower Extremity Assessment Lower Extremity Assessment: Generalized weakness    Cervical / Trunk Assessment Cervical / Trunk Assessment: Kyphotic  Communication   Communication Communication: No apparent difficulties Factors Affecting Communication: Hearing impaired    Cognition Arousal: Alert Behavior During Therapy: [facility] for tasks assessed/performed  Following commands: Intact       Cueing Cueing Techniques: Verbal cues, Tactile cues, Visual cues     General Comments      Exercises General Exercises - Upper Extremity Shoulder Flexion: AAROM, Both, 5 reps, AROM Elbow Flexion: AROM, Both, 5 reps General Exercises - Lower Extremity Ankle Circles/Pumps: AROM, Both, 10 reps Quad Sets: AROM, Both, 5 reps Heel Slides: AAROM, Both, 5 reps   Assessment/Plan    PT Assessment Patient needs continued PT services  PT Problem List Decreased strength;Decreased range of motion;Decreased activity tolerance;Decreased balance;Decreased mobility;Decreased knowledge of use of DME       PT Treatment Interventions Functional mobility training;Therapeutic activities;Therapeutic exercise;Patient/family education;Balance training;DME instruction    PT Goals (Current goals can be found in the Care Plan section)  Acute Rehab PT  Goals Patient Stated Goal: none per pt. daughter would like pt to return to her LTC SNF room with HHPT f/u PT Goal Formulation: With family Time For Goal Achievement: 05/14/23 Potential to Achieve Goals: Poor    Frequency Min 1X/week     Co-evaluation               AM-PAC PT "6 Clicks" Mobility  Outcome Measure Help needed turning from your back to your side while in a flat bed without using bedrails?: Total Help needed moving from lying on your back to sitting on the side of a flat bed without using bedrails?: Total Help needed moving to and from a bed to a chair (including a wheelchair)?: Total Help needed standing up from a chair using your arms (e.g., wheelchair or bedside chair)?: Total Help needed to walk in hospital room?: Total Help needed climbing 3-5 steps with a railing? : Total 6 Click Score: 6    End of Session   Activity Tolerance: Patient limited by fatigue Patient left: in bed;with call bell/phone within reach;with bed alarm set;with family/visitor present   PT Visit Diagnosis: Muscle weakness (generalized) (M62.81);Difficulty in walking, not elsewhere classified (R26.2)    Time: 8119-1478 PT Time Calculation (min) (ACUTE ONLY): 14 min   Charges:   PT Evaluation $PT Eval Low Complexity: 1 Low   PT General Charges $$ ACUTE PT VISIT: 1 Visit            Faye Ramsay, PT Acute Rehabilitation  Office: [phone]

## 2023-04-30 NOTE — Assessment & Plan Note (Signed)
 BP controlled - Continue furosemide

## 2023-04-30 NOTE — Assessment & Plan Note (Signed)
 Continue sertraline

## 2023-04-30 NOTE — Assessment & Plan Note (Addendum)
 Pneumonia ruled out.  Respiratory failure ruled out.  Had dyspnea and transient hypoxia.   - Change to carbapenem - Follow culture data

## 2023-04-30 NOTE — Progress Notes (Addendum)
 PHARMACY - PHYSICIAN COMMUNICATION CRITICAL VALUE ALERT - BLOOD CULTURE IDENTIFICATION (BCID)  [name] is an 89 y.o. female who presented to [facility] on 04/29/2023 with a chief complaint of shortness of breath.  Assessment: 2/3 Bcx bottles growing ESBL E Coli   Name of physician (or Provider) Contacted: Dr. Maryfrances Bunnell  Current antibiotics: ceftriaxone and doxycycline   Changes to prescribed antibiotics recommended:  Discontinue ceftriaxone and start meropenem. Continue doxycycline for atypical coverage of pneumonia.  Plan (after discussion with MD): Stop ceftriaxone and doxycycline (atypical coverage not needed, per MD) Start Meropenem 1g IV q12hrs  Monitor renal function, overall clinical picture  Results for orders placed or performed during the hospital encounter of 03/29/23  Blood Culture ID Panel (Reflexed) (Collected: 03/30/2023  3:26 AM)  Result Value Ref Range   Enterococcus faecalis NOT DETECTED NOT DETECTED   Enterococcus Faecium NOT DETECTED NOT DETECTED   Listeria monocytogenes NOT DETECTED NOT DETECTED   Staphylococcus species NOT DETECTED NOT DETECTED   Staphylococcus aureus (BCID) NOT DETECTED NOT DETECTED   Staphylococcus epidermidis NOT DETECTED NOT DETECTED   Staphylococcus lugdunensis NOT DETECTED NOT DETECTED   Streptococcus species NOT DETECTED NOT DETECTED   Streptococcus agalactiae NOT DETECTED NOT DETECTED   Streptococcus pneumoniae NOT DETECTED NOT DETECTED   Streptococcus pyogenes NOT DETECTED NOT DETECTED   A.calcoaceticus-baumannii NOT DETECTED NOT DETECTED   Bacteroides fragilis NOT DETECTED NOT DETECTED   Enterobacterales DETECTED (A) NOT DETECTED   Enterobacter cloacae complex NOT DETECTED NOT DETECTED   Escherichia coli DETECTED (A) NOT DETECTED   Klebsiella aerogenes NOT DETECTED NOT DETECTED   Klebsiella oxytoca NOT DETECTED NOT DETECTED   Klebsiella pneumoniae NOT DETECTED NOT DETECTED   Proteus species NOT DETECTED NOT DETECTED    Salmonella species NOT DETECTED NOT DETECTED   Serratia marcescens NOT DETECTED NOT DETECTED   Haemophilus influenzae NOT DETECTED NOT DETECTED   Neisseria meningitidis NOT DETECTED NOT DETECTED   Pseudomonas aeruginosa NOT DETECTED NOT DETECTED   Stenotrophomonas maltophilia NOT DETECTED NOT DETECTED   Candida albicans NOT DETECTED NOT DETECTED   Candida auris NOT DETECTED NOT DETECTED   Candida glabrata NOT DETECTED NOT DETECTED   Candida krusei NOT DETECTED NOT DETECTED   Candida parapsilosis NOT DETECTED NOT DETECTED   Candida tropicalis NOT DETECTED NOT DETECTED   Cryptococcus neoformans/gattii NOT DETECTED NOT DETECTED   CTX-M ESBL DETECTED (A) NOT DETECTED   Carbapenem resistance IMP NOT DETECTED NOT DETECTED   Carbapenem resistance KPC NOT DETECTED NOT DETECTED   Carbapenem resistance NDM NOT DETECTED NOT DETECTED   Carbapenem resist OXA 48 LIKE NOT DETECTED NOT DETECTED   Carbapenem resistance VIM NOT DETECTED NOT DETECTED    Cherylin Mylar, PharmD Clinical Pharmacist  3/9/20254:42 PM

## 2023-04-30 NOTE — Hospital Course (Signed)
 89 y.o. F with dCHF, CAD, pAF on Eliquis, Sarcoid quiescent, recent ESBL pyelonephritis with ureteral stone who presented with malaise, cough.  In the ER, diagnosed with pneumonia and CHF and admitted.  Blood cultures subsequently growing ESBL.

## 2023-04-30 NOTE — Assessment & Plan Note (Addendum)
 Exacerbation ruled out.  Reports dyspnea but SpO2 normal.  No wheezing. - Continue Trelegy

## 2023-04-30 NOTE — Assessment & Plan Note (Signed)
TSH normal last month ?-Continue levothyroxine ?

## 2023-04-30 NOTE — Assessment & Plan Note (Signed)
-   Hold methenamine - See above

## 2023-04-30 NOTE — Assessment & Plan Note (Signed)
 Slow rate atrial fibrillation, continue with apixaban as tolerated.

## 2023-04-30 NOTE — Assessment & Plan Note (Signed)
 Continue Eliquis.

## 2023-04-30 NOTE — Progress Notes (Signed)
 Made MD Mikeal Hawthorne  aware that the pt had three foul smelling runny stools. No new orders at this time. Plan of care on going.

## 2023-04-30 NOTE — Evaluation (Signed)
 Occupational Therapy Evaluation Patient Details Name: [name] MRN: [id number] DOB: 1934-02-28 Today's Date: 04/30/2023   History of Present Illness   Patient is a 89 year old female  who presents to the emergency room on 04/29/23 from her facility with significant shortness of breath and cough.  Patient was having difficulty breathing and oxygen level was noted to be over 79% on room air. Pt with recent hoslitalzation in Feb 2025 from LTC SNF with recent UTI diagnosis with failure to improve. She was found with sepsis secondary to UTI, acute metabolic encephalopathy, Failure to thrive. PMH: anemia, AKI, aortic atherosclerosis, asthma, CAD, carotid stenosis, L hip fx, inferior pubic ramus fx, R eye macular degeneration.     Clinical Impressions Patient is currently requiring as high as Total assistance with bed level basic ADLs, as well as  up to maximum assist with bed mobility and inability to perform functional transfers to [facility] without assistance of 2 people, so this was deferred for today.  Per daughter, pt also with significantly decreased PO intake and pt reports all food and water "taste disgusting".  Current level of function is a little below patient's typical baseline.    During this evaluation, patient was limited by poor PO intake, generalized weakness, impaired activity tolerance, and cognitive deficits, all of which has the potential to impact patient's and/or caregivers' safety and independence during functional mobility, as well as performance for ADLs.    Patient lives at [facility] for Long Term residency but still recevies Pt and OT in her room which is recommended to resumed once pt returns.  Patient demonstrates fair rehab potential, and should benefit from continued skilled occupational therapy services while in acute care to maximize safety, independence and quality of life at home.  Continued occupational therapy services are recommended. Patient could benefit from  continued inpatient follow up therapy, <3 hours/day at her home of [place]   ?      If plan is discharge home, recommend the following:   A lot of help with bathing/dressing/bathroom;A lot of help with walking and/or transfers;Two people to help with walking and/or transfers;Assistance with cooking/housework;Assist for transportation;Assistance with feeding;Supervision due to cognitive status     Functional Status Assessment   Patient has had a recent decline in their functional status and demonstrates the ability to make significant improvements in function in a reasonable and predictable amount of time.     Equipment Recommendations   None recommended by OT     Recommendations for Other Services         Precautions/Restrictions   Precautions Precautions: Fall Restrictions Weight Bearing Restrictions Per Provider Order: No     Mobility Bed Mobility Overal bed mobility: Needs Assistance Bed Mobility: Supine to Sit, Sit to Supine     Supine to sit: HOB elevated, Used rails, Max assist Sit to supine: HOB elevated, Used rails, Mod assist        Transfers                          Balance Overall balance assessment: Needs assistance Sitting-balance support: Single extremity supported, Feet unsupported Sitting balance-Leahy Scale: Poor                                     ADL either performed or assessed with clinical judgement   ADL Overall ADL's : Needs assistance/impaired Eating/Feeding: Cueing for  sequencing;Minimal assistance;Moderate assistance;Sitting Eating/Feeding Details (indicate cue type and reason): Cues to use hand and not ask for hand-feeding. Grooming: Wash/dry face;Sitting;Minimal assistance;Set up;Cueing for sequencing   Upper Body Bathing: Sitting;Moderate assistance;Maximal assistance;Cueing for sequencing   Lower Body Bathing: Bed level;Maximal assistance   Upper Body Dressing : Moderate  assistance;Sitting   Lower Body Dressing: Total assistance;Bed level     Toilet Transfer Details (indicate cue type and reason): Not tested. Pt will need 2 for safety Toileting- Clothing Manipulation and Hygiene: Total assistance;Bed level       Functional mobility during ADLs: Maximal assistance;Total assistance;+2 for physical assistance;Minimal assistance;Moderate assistance;Cueing for sequencing;Cueing for safety (Bed mobility Max-Total and EOB balance Min -Mod)       Vision Baseline Vision/History: 6 Macular Degeneration (in RT eye) Ability to See in Adequate Light: 1 Impaired Patient Visual Report: No change from baseline       Perception         Praxis         Pertinent Vitals/Pain Pain Assessment Pain Assessment: No/denies pain     Extremity/Trunk Assessment Upper Extremity Assessment Upper Extremity Assessment: Generalized weakness;Right hand dominant (Reduced shoulder moveoment due to kyphosis, humeral head very anteriorly set.)   Lower Extremity Assessment Lower Extremity Assessment: Defer to PT evaluation   Cervical / Trunk Assessment Cervical / Trunk Assessment: Kyphotic   Communication Communication Communication: Impaired Factors Affecting Communication: Hearing impaired   Cognition Arousal: Alert Behavior During Therapy: WFL for tasks assessed/performed, Anxious Cognition: History of cognitive impairments             OT - Cognition Comments: Reduced memory, repeats self and repeats her questions.                 Following commands: Intact (1-step)       Cueing  General Comments   Cueing Techniques: Verbal cues;Tactile cues;Visual cues      Exercises Other Exercises Other Exercises: Pt woprked on siting EOB for activity tolerance, breathing and core strengthening. Pt unable to reolease LT hand from bed rail for support but progressed to sitting with SBA.  Ot performed 2 porlonged pectoralis stretches with pt cued to take deep,  long breaths. Pt continued to inhale and exhale shallowly and rapidly.  Pt tolerated 4 min of EOB sitting and worked on lateral scooting along EOB with Max-Total As   Shoulder Instructions      Home Living Family/patient expects to be discharged to:: Skilled nursing facility                                 Additional Comments: Pt is a Long Term resident at [facility] but remains active with PT/OT per family.      Prior Functioning/Environment Prior Level of Function : History of Falls (last six months) (during a transfer with 1 small person assisting.)             Mobility Comments: bed<>transfers to WC<>toilet wtih assistance of 1-2, depending on staff preference per daughter. No use of mechanical lift. ADLs Comments: daughter reported having 1x from staff for transfers. patient is able to participate in UB ADLs with supet and SBA, but has staff support for LB ADLs. Poor PO intake per family.    OT Problem List: Decreased coordination;Impaired vision/perception;Impaired balance (sitting and/or standing);Decreased knowledge of precautions;Decreased knowledge of use of DME or AE;Impaired UE functional use;Decreased activity tolerance;Decreased range of motion;Decreased safety awareness;Decreased cognition;Decreased  strength   OT Treatment/Interventions: Self-care/ADL training;Therapeutic activities;Therapeutic exercise;Cognitive remediation/compensation;Energy conservation;Patient/family education;Balance training;DME and/or AE instruction;Manual therapy      OT Goals(Current goals can be found in the care plan section)   Acute Rehab OT Goals Patient Stated Goal: Per daughter, for pt to be able to improve balance and transfer to [facility] safely with facility staff assisting. OT Goal Formulation: With family Time For Goal Achievement: 05/14/23 Potential to Achieve Goals: Fair ADL Goals Pt Will Perform Eating: with contact guard assist;with set-up;sitting;with adaptive  utensils (adaptive grips if needed. Promote PO intake and completing at least 25% of each meal.) Pt Will Perform Grooming: sitting;with min assist;with adaptive equipment (In least supportive position able for safety, tolerating sitting for 20 min during light activity.) Pt Will Transfer to Toilet: squat pivot transfer;with transfer board (LRAD for possible drop arm BSC and drop arm recliner) Pt/caregiver will Perform Home Exercise Program: Increased ROM;Increased strength;Both right and left upper extremity;With minimal assist;With Supervision Additional ADL Goal #1: Pt will improve sitting balance to good static without UE support, and fair+ dynamic with UE support in order to increase safety and participation during seated ADLs.   OT Frequency:  Min 1X/week    Co-evaluation              AM-PAC OT "6 Clicks" Daily Activity     Outcome Measure Help from another person eating meals?: A Lot Help from another person taking care of personal grooming?: A Lot Help from another person toileting, which includes using toliet, bedpan, or urinal?: Total Help from another person bathing (including washing, rinsing, drying)?: A Lot Help from another person to put on and taking off regular upper body clothing?: A Lot Help from another person to put on and taking off regular lower body clothing?: Total 6 Click Score: 10   End of Session Equipment Utilized During Treatment:  (none) Nurse Communication: Other (comment) (Dr. Maryfrances Bunnell handed off to OT to proceed with Eval)  Activity Tolerance: Patient limited by fatigue Patient left: in bed;with call bell/phone within reach;with bed alarm set;with family/visitor present  OT Visit Diagnosis: Unsteadiness on feet (R26.81);Adult, failure to thrive (R62.7);Feeding difficulties (R63.3);Other symptoms and signs involving cognitive function;History of falling (Z91.81)                Time: 5284-1324 OT Time Calculation (min): 28 min Charges:  OT General  Charges $OT Visit: 1 Visit OT Evaluation $OT Eval Low Complexity: 1 Low OT Treatments $Therapeutic Activity: 8-22 mins  Victorino Dike, OT Acute Rehab Services Office: [phone] 04/30/2023   Theodoro Clock 04/30/2023, 12:52 PM

## 2023-04-30 NOTE — Assessment & Plan Note (Signed)
 Acute CHF flare ruled out.  Appears euvolemic. - Continuie home furosemide

## 2023-04-30 NOTE — Progress Notes (Signed)
  Progress Note   Patient: [name] ZOX:[id number] DOB: [birth year] DOA: 04/29/2023     1 DOS: the patient was seen and examined on 04/30/2023 at 11:20AM      Brief hospital course: 89 y.o. F with dCHF, CAD, pAF on Eliquis, Sarcoid quiescent, recent ESBL pyelonephritis with ureteral stone who presented with malaise, cough.  In the ER, diagnosed with pneumonia and CHF and admitted.  Blood cultures subsequently growing ESBL.     Assessment and Plan: * ESBL E. coli bacteremia Pneumonia ruled out.  Respiratory failure ruled out.  Had dyspnea and transient hypoxia.   - Change to carbapenem - Follow culture data  Paroxysmal atrial fibrillation (HCC) Rate controlled - Continue Eliquis  SARCOIDOSIS, PULMONARY    Recurrent UTI - Hold methenamine - See above  Hypothyroidism - Continue LT4  Dementia - Continue sertraline  Chronic diastolic CHF (congestive heart failure) (HCC) Acute CHF flare ruled out.  Appears euvolemic. - Continuie home furosemide  COPD (chronic obstructive pulmonary disease) (HCC) Exacerbation ruled out.  Reports dyspnea but SpO2 normal.  No wheezing. - Continue Trelegy  Essential hypertension BP controlled - Continue furosemide  CAD (coronary artery disease) - Continue Eliquis          Subjective: Restless, uncomfortable     Physical Exam: BP (!) 112/43 (BP Location: Left Arm)   Pulse 73   Temp 98.4 F (36.9 C) (Oral)   Resp 20   Ht 5\' 3"  (1.6 m)   Wt 54 kg   LMP 02/22/1983 (Approximate)   SpO2 96%   BMI 21.09 kg/m   Thin elderly female, lying in bed, restless RRR no murmurs RR normal, lungs clear Abdomen diffuse discomfort, mild guarding Attention normal affect normal   Data Reviewed: BMP shows mild hypoK LFTs normal BNP up WBC 25K, lactate normal, platelets normal  Family Communication: Daughter    Disposition: Status is: Inpatient         Author: Alberteen Sam, MD 04/30/2023 5:23 PM  For  on call review [email or web address].

## 2023-05-01 DIAGNOSIS — R7881 Bacteremia: Secondary | ICD-10-CM | POA: Diagnosis not present

## 2023-05-01 DIAGNOSIS — B9620 Unspecified Escherichia coli [E. coli] as the cause of diseases classified elsewhere: Secondary | ICD-10-CM | POA: Diagnosis not present

## 2023-05-01 LAB — BASIC METABOLIC PANEL
Anion gap: 11 (ref 5–15)
BUN: 17 mg/dL (ref 8–23)
CO2: 23 mmol/L (ref 22–32)
Calcium: 7.7 mg/dL — ABNORMAL LOW (ref 8.9–10.3)
Chloride: 97 mmol/L — ABNORMAL LOW (ref 98–111)
Creatinine, Ser: 0.87 mg/dL (ref 0.44–1.00)
GFR, Estimated: >60 mL/min (ref >60)
Glucose, Bld: 101 mg/dL — ABNORMAL HIGH (ref 70–99)
Potassium: 2.2 mmol/L — CL (ref 3.5–5.1)
Sodium: 131 mmol/L — ABNORMAL LOW (ref 135–145)

## 2023-05-01 LAB — URINE CULTURE: Culture: NO GROWTH

## 2023-05-01 LAB — MAGNESIUM: Magnesium: 1.6 mg/dL — ABNORMAL LOW (ref 1.7–2.4)

## 2023-05-01 MED ORDER — POTASSIUM CHLORIDE CRYS ER 20 MEQ PO TBCR
40.0000 meq | EXTENDED_RELEASE_TABLET | Freq: Four times a day (QID) | ORAL | Status: DC
Start: 2023-05-01 — End: 2023-05-01

## 2023-05-01 MED ORDER — POTASSIUM CHLORIDE CRYS ER 20 MEQ PO TBCR
40.0000 meq | EXTENDED_RELEASE_TABLET | Freq: Once | ORAL | Status: AC
  Administered 2023-05-01: 40 meq via ORAL
  Filled 2023-05-01: qty 2

## 2023-05-01 MED ORDER — MAGNESIUM SULFATE 2 GM/50ML IV SOLN
2.0000 g | Freq: Once | INTRAVENOUS | Status: AC
  Administered 2023-05-01: 2 g via INTRAVENOUS
  Filled 2023-05-01: qty 50

## 2023-05-01 MED ORDER — POTASSIUM CHLORIDE CRYS ER 20 MEQ PO TBCR: 40.0000 meq | EXTENDED_RELEASE_TABLET | Freq: Two times a day (BID) | ORAL | Status: DC

## 2023-05-01 MED ORDER — POTASSIUM CHLORIDE 10 MEQ/100ML IV SOLN
10.0000 meq | INTRAVENOUS | Status: DC
  Administered 2023-05-01 (×2): 10 meq via INTRAVENOUS
  Filled 2023-05-01 (×3): qty 100

## 2023-05-01 MED ORDER — MAGNESIUM SULFATE 2 GM/50ML IV SOLN
2.0000 g | Freq: Once | INTRAVENOUS | Status: DC
Start: 2023-05-01 — End: 2023-05-01

## 2023-05-01 MED ORDER — POTASSIUM CHLORIDE CRYS ER 20 MEQ PO TBCR
40.0000 meq | EXTENDED_RELEASE_TABLET | Freq: Two times a day (BID) | ORAL | Status: DC
Start: 2023-05-01 — End: 2023-05-01
  Administered 2023-05-01: 40 meq via ORAL
  Filled 2023-05-01: qty 2

## 2023-05-01 NOTE — TOC Initial Note (Addendum)
 Transition of Care [facility]) - Initial/Assessment Note   Patient Details  Name: [name] MRN: [id number] Date of Birth: 20-Apr-1934  Transition of Care [facility]) CM/SW Contact:    Ewing Schlein, LCSW Phone Number: 05/01/2023, 12:26 PM  Clinical Narrative: Patient is from LTC at [facility]. CSW spoke with patient's daughter, Braulio Conte, regarding discharge planning. Per daughter, at this time she would like to have the patient return to her LTC bed at [facility] with [facility] services, but would like to speak with [facility] first before deciding. CSW updated [place] in admissions at [place] and [place] confirmed [place] uses [facility] onsite if daughter wants the patient to go back to LTC rather than SNF. FL2 started. Referral has been made to heart failure navigation team, so CHF consult will be cleared at this time. TOC to follow.  Expected Discharge Plan: Long Term Nursing Home Barriers to Discharge: Continued Medical Work up  Patient Goals and CMS Choice Patient states their goals for this hospitalization and ongoing recovery are:: Return to [facility]  Expected Discharge Plan and Services In-house Referral: Clinical Social Work Living arrangements for the past 2 months: Skilled Nursing Facility           DME Arranged: N/A DME Agency: NA  Prior Living Arrangements/Services Living arrangements for the past 2 months: Skilled Nursing Facility Lives with:: Facility Resident Patient language and need for interpreter reviewed:: Yes Do you feel safe going back to the place where you live?: Yes      Need for Family Participation in Patient Care: Yes (Comment) Care giver support system in place?: Yes (comment) Current home services: DME (Wheelchair, walker, cane)  Activities of Daily Living ADL Screening (condition at time of admission) Independently performs ADLs?: Yes (appropriate for developmental age) Is the patient deaf or have difficulty hearing?: No Does the patient  have difficulty seeing, even when wearing glasses/contacts?: No Does the patient have difficulty concentrating, remembering, or making decisions?: Yes  Permission Sought/Granted Permission sought to share information with : Facility Industrial/product designer granted to share information with : Yes, Verbal Permission Granted Permission granted to share info w AGENCY: [place]  Emotional Assessment Orientation: : Oriented to Self, Oriented to Place, Oriented to Situation Alcohol / Substance Use: Not Applicable Psych Involvement: No (comment)  Admission diagnosis:  Acute hypoxemic respiratory failure (HCC) [J96.01] Pneumonia due to infectious organism, unspecified laterality, unspecified part of lung [J18.9] Congestive heart failure, unspecified HF chronicity, unspecified heart failure type [facility]) [I50.9] Patient Active Problem List   Diagnosis Date Noted   Hypomagnesemia 05/01/2023   Dementia 04/30/2023   Hypothyroidism 04/30/2023   Recurrent UTI 04/30/2023   ESBL E. coli bacteremia 04/29/2023   Sepsis secondary to UTI (HCC) 03/30/2023   Prolonged QT interval 03/30/2023   Hyponatremia 03/30/2023   Physical deconditioning 03/23/2023   Xerostomia 03/09/2023   Pressure injury of skin 04/27/2022   Hypokalemia 04/27/2022   Bilateral pleural effusion 04/24/2022   Need for emotional support 04/24/2022   High risk medication use 04/24/2022   Pain 04/24/2022   DNR (do not resuscitate) 04/24/2022   End of life care 04/24/2022   Dyspnea 04/24/2022   Palliative care encounter 04/21/2022   Goals of care, counseling/discussion 04/21/2022   Counseling and coordination of care 04/21/2022   Malnutrition of moderate degree 04/20/2022   Atrial fibrillation with RVR (HCC) 04/18/2022   Hypotension 04/18/2022   Failure to thrive in adult 04/18/2022   Acute urinary retention 04/18/2022   RSV (respiratory syncytial virus pneumonia)  03/11/2022   COPD exacerbation (HCC) 03/09/2022    Paroxysmal atrial fibrillation (HCC) 03/09/2022   Acute respiratory failure with hypoxia (HCC) 03/09/2022   Bronchiectasis without complication (HCC) 03/04/2022   URI (upper respiratory infection) 03/04/2022   Elevated troponin    Anemia 08/08/2021   Bladder spasm 08/08/2021   Chest pain 08/08/2021   Chest pain, rule out acute myocardial infarction 08/07/2021   Chronic diastolic CHF (congestive heart failure) (HCC) 02/03/2021   Cognitive decline 01/18/2021   Right knee pain 06/08/2020   Age-related osteoporosis without current pathological fracture 05/01/2020   Nasal congestion 05/01/2020   Personal history of COVID-19 05/01/2020   Bilateral hearing loss 11/15/2019   Post-nasal drainage 11/15/2019   COPD (chronic obstructive pulmonary disease) (HCC) 10/30/2019   Atrial fibrillation (HCC)    Adhesive capsulitis of right shoulder 07/29/2019   Metabolic encephalopathy 07/29/2019   Dysphagia due to suspected esophageal stenosis 07/18/2019   S/P reverse total shoulder arthroplasty, 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   Rotator cuff arthropathy of left shoulder 05/03/2019   Shoulder pain, left 04/29/2019   Low back pain 03/13/2019   Degeneration of lumbar intervertebral disc 02/14/2019   Amnesia 02/06/2018   ETD (Eustachian tube dysfunction), bilateral 11/07/2017   Chronic nonintractable headache 11/07/2017   Otalgia 10/17/2017   Pain in joint of right elbow 05/02/2017   Rib lesion 04/20/2017   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   Atopic dermatitis 04/08/2016   Skin sensation disturbance 03/08/2016   Weakness 12/22/2015   Allergic rhinitis 09/15/2015   CAD (coronary artery disease) 05/18/2015   Essential hypertension 05/18/2015   Carotid artery disease (HCC) 05/18/2015   Cough 03/19/2015   Shortness of breath 12/26/2013   Abnormal gait 11/06/2013   Idiopathic scoliosis and kyphoscoliosis 07/30/2013   Acquired unequal leg length on left 04/11/2013   History of fracture  10/18/2012   Palpitations 10/18/2012   Constipation 09/05/2012   History of sinus tachycardia 09/05/2012   Spasm of back muscles 03/12/2012   Lumbar radiculopathy 11/23/2011   Mitral regurgitation 11/22/2011   Low compliance bladder 04/18/2011   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   Carpal tunnel syndrome 02/23/2009   Cardiovascular symptoms 02/23/2009   Vitamin D deficiency 02/23/2009   RECTAL BLEEDING 08/18/2008   History of colonic polyps 08/18/2008   SKIN CANCER, HX OF 08/14/2008   CARPAL TUNNEL SYNDROME, HX OF 08/14/2008   SARCOIDOSIS, PULMONARY 08/08/2008   HLD (hyperlipidemia) 08/08/2008   Paroxysmal supraventricular tachycardia (HCC) 08/08/2008   GERD (gastroesophageal reflux disease) 08/08/2008   PCP:  Eloisa Northern, MD Pharmacy:   [facility] [place], [place] - [address] [place] [address] Phone: [phone] Fax: [phone]  [facility] [place] [place] - [address] [place] [address] Phone: [phone] Fax: [phone]  Social Drivers of Health (SDOH) Social History: SDOH Screenings   Food Insecurity: No Food Insecurity (04/29/2023)  Housing: Low Risk  (04/29/2023)  Transportation Needs: No Transportation Needs (04/29/2023)  Utilities: Not At Risk (04/29/2023)  Social Connections: Patient Unable To Answer (04/29/2023)  Tobacco Use: Low Risk  (04/29/2023)   SDOH Interventions: Utilities Interventions: Patient Declined Social Connections Interventions: Patient Unable to Answer  Readmission Risk Interventions    04/07/2023    1:30 PM 03/31/2023   11:08 AM  Readmission Risk Prevention Plan  Transportation Screening Complete Complete  PCP or Specialist Appt within 3-5 Days Complete   HRI or Home Care Consult Complete   Social Work  Consult for Recovery Care Planning/Counseling Complete   Palliative Care Screening Not Applicable   Medication Review (RN  Care Manager) Complete Complete  PCP or Specialist appointment within 3-5 days of discharge  Complete  HRI or Home Care Consult  Complete  SW Recovery Care/Counseling Consult  Complete  Palliative Care Screening  Complete  Skilled Nursing Facility  Complete

## 2023-05-01 NOTE — Progress Notes (Signed)
 PHARMACY NOTE -  Meropenem  Pharmacy has been assisting with dosing of meropenem for ESBL bacteremia. Dosage remains stable at 1g IV q12 hr and further renal adjustments per institutional Pharmacy antibiotic protocol  Pharmacy will sign off, following peripherally for culture results, dose adjustments, and length of therapy. Please reconsult if a change in clinical status warrants re-evaluation of dosage.  Bernadene Person, PharmD, BCPS [phone] 05/01/2023, 10:28 AM

## 2023-05-01 NOTE — Plan of Care (Signed)
   Problem: Coping: Goal: Level of anxiety will decrease Outcome: Progressing   Problem: Safety: Goal: Ability to remain free from injury will improve Outcome: Progressing   Problem: Skin Integrity: Goal: Risk for impaired skin integrity will decrease Outcome: Progressing

## 2023-05-01 NOTE — Progress Notes (Signed)
  Progress Note   Patient: [name] ZOX:[id number] DOB: 20-Dec-1934 DOA: 04/29/2023     2 DOS: the patient was seen and examined on 05/01/2023        Brief hospital course: 89 y.o. F with dCHF, CAD, pAF on Eliquis, Sarcoid quiescent, recent ESBL pyelonephritis with ureteral stone who presented with malaise, cough.  In the ER, diagnosed with pneumonia and CHF and admitted.  Blood cultures subsequently growing ESBL.     Assessment and Plan: * ESBL E. coli bacteremia Pneumonia ruled out.  Respiratory failure ruled out.  Had dyspnea and transient hypoxia.    Feeling somewhat better.  No fever. - Continue Meropenem, day 2 of 7 - Follow urine culture and blood sensitivities   Evidently had an episode of transaminitis years ago and sulfa has been added to her allergy list.  However she has tolerated Bactrim since then, so this is not a true allergy, and so we will remove this from her allergy list.  We need to work towards getting this removed from her allergy list at [facility] as well.  Paroxysmal atrial fibrillation (HCC) Rate controlled - Continue Eliquis  SARCOIDOSIS, PULMONARY History of.  No active treatment at this time.   Recurrent UTI - Hold methenamine - See above  Hypothyroidism TSH normal last month - Continue levothyroxine  Dementia - Continue sertraline  Chronic diastolic CHF (congestive heart failure) (HCC) Acute CHF flare ruled out.  Appears euvolemic. - Continuie home furosemide  COPD (chronic obstructive pulmonary disease) (HCC) Exacerbation ruled out.  Reports dyspnea but SpO2 normal.  No wheezing. - Continue Trelegy  Essential hypertension BP controlled - Continue furosemide  CAD (coronary artery disease) - Continue Eliquis  Hypokalemia - Supplement potassium  Hypomagnesemia - Supplement magnesium  Hyponatremia - Hold further fluids        Subjective: Generally better, feeling short of breath, but SpO2 normal.  No confusion,  no fever.     Physical Exam: BP (!) 129/56   Pulse 65   Temp 98 F (36.7 C)   Resp 20   Ht 5\' 3"  (1.6 m)   Wt 54.2 kg   LMP 02/22/1983 (Approximate)   SpO2 100%   BMI 21.17 kg/m   General: Female, lying in bed, appears weak and tired RRR, no murmurs, no peripheral edema Respiratory normal, lungs clear without rales or wheezes Abdomen soft without tenderness palpation or guarding, no ascites or distention She appears to have chronic dementia, but generalized weakness but nothing else  Data Reviewed: Patient metabolic panel shows hyponatremia, hypokalemia, and hypomagnesemia, as well as hypoalbuminemia Urinalysis with bacteria Urine culture pending Blood cultures normal Echocardiogram shows EF 55 to 60%, no sig valvular disease  Family Communication: daughter    Disposition: Status is: Inpatient         Author: Alberteen Sam, MD 05/01/2023 4:41 PM  For on call review [email or web address].

## 2023-05-02 ENCOUNTER — Inpatient Hospital Stay (HOSPITAL_COMMUNITY)

## 2023-05-02 DIAGNOSIS — R7881 Bacteremia: Secondary | ICD-10-CM | POA: Diagnosis not present

## 2023-05-02 DIAGNOSIS — B9620 Unspecified Escherichia coli [E. coli] as the cause of diseases classified elsewhere: Secondary | ICD-10-CM | POA: Diagnosis not present

## 2023-05-02 LAB — CULTURE, BLOOD (ROUTINE X 2)

## 2023-05-02 LAB — BASIC METABOLIC PANEL
Anion gap: 6 (ref 5–15)
BUN: 19 mg/dL (ref 8–23)
CO2: 24 mmol/L (ref 22–32)
Calcium: 7.9 mg/dL — ABNORMAL LOW (ref 8.9–10.3)
Chloride: 102 mmol/L (ref 98–111)
Creatinine, Ser: 0.68 mg/dL (ref 0.44–1.00)
GFR, Estimated: >60 mL/min (ref >60)
Glucose, Bld: 103 mg/dL — ABNORMAL HIGH (ref 70–99)
Potassium: 3.5 mmol/L (ref 3.5–5.1)
Sodium: 132 mmol/L — ABNORMAL LOW (ref 135–145)

## 2023-05-02 LAB — CBC
HCT: 34.7 % — ABNORMAL LOW (ref 36.0–46.0)
Hemoglobin: 11.7 g/dL — ABNORMAL LOW (ref 12.0–15.0)
MCH: 32.3 pg (ref 26.0–34.0)
MCHC: 33.7 g/dL (ref 30.0–36.0)
MCV: 95.9 fL (ref 80.0–100.0)
Platelets: 201 10*3/uL (ref 150–400)
RBC: 3.62 MIL/uL — ABNORMAL LOW (ref 3.87–5.11)
RDW: 14.8 % (ref 11.5–15.5)
WBC: 16.9 10*3/uL — ABNORMAL HIGH (ref 4.0–10.5)
nRBC: 0 % (ref 0.0–0.2)

## 2023-05-02 MED ORDER — UMECLIDINIUM BROMIDE 62.5 MCG/ACT IN AEPB
1.0000 | INHALATION_SPRAY | Freq: Every day | RESPIRATORY_TRACT | Status: DC
  Administered 2023-05-04: 1 via RESPIRATORY_TRACT
  Filled 2023-05-02: qty 7

## 2023-05-02 MED ORDER — BUDESON-GLYCOPYRROL-FORMOTEROL 160-9-4.8 MCG/ACT IN AERO: 2.0000 | INHALATION_SPRAY | Freq: Two times a day (BID) | RESPIRATORY_TRACT | Status: DC

## 2023-05-02 MED ORDER — IOHEXOL 300 MG/ML  SOLN
100.0000 mL | Freq: Once | INTRAMUSCULAR | Status: AC | PRN
  Administered 2023-05-02: 100 mL via INTRAVENOUS

## 2023-05-02 MED ORDER — FLUTICASONE FUROATE-VILANTEROL 100-25 MCG/ACT IN AEPB
1.0000 | INHALATION_SPRAY | Freq: Every day | RESPIRATORY_TRACT | Status: DC
  Administered 2023-05-04: 1 via RESPIRATORY_TRACT
  Filled 2023-05-02: qty 28

## 2023-05-02 MED ORDER — SODIUM CHLORIDE 0.9 % IV SOLN
1.0000 g | INTRAVENOUS | Status: DC
  Administered 2023-05-02 – 2023-05-04 (×3): 1 g via INTRAVENOUS
  Filled 2023-05-02 (×4): qty 1000

## 2023-05-02 NOTE — TOC Progression Note (Signed)
 Transition of Care [facility]) - Progression Note   Patient Details  Name: [name] MRN: [id number] Date of Birth: 1935-02-01  Transition of [facility]) CM/SW Contact  Ewing Schlein, LCSW Phone Number: 05/02/2023, 11:15 AM  Clinical Narrative: CSW spoke with daughter, Braulio Conte, to discuss discharge plan. Per daughter, if plan is for the patient to remain in the hospital for IV antibiotics and then discharge back to [facility] when the course is finished, she would prefer that the patient return to her LTC bed with HHPT. However, daughter spoke with admissions today and was informed the patient will need to be admitted to SNF if she discharges back with a PICC for IV antibiotics. TOC to follow.  Expected Discharge Plan: Long Term Nursing Home Barriers to Discharge: Continued Medical Work up  Expected Discharge Plan and Services In-house Referral: Clinical Social Work Living arrangements for the past 2 months: Skilled Nursing Facility             DME Arranged: N/A DME Agency: NA  Social Determinants of Health (SDOH) Interventions SDOH Screenings   Food Insecurity: No Food Insecurity (04/29/2023)  Housing: Low Risk  (04/29/2023)  Transportation Needs: No Transportation Needs (04/29/2023)  Utilities: Not At Risk (04/29/2023)  Social Connections: Patient Unable To Answer (04/29/2023)  Tobacco Use: Low Risk  (04/29/2023)   Readmission Risk Interventions    05/02/2023   11:15 AM 04/07/2023    1:30 PM 03/31/2023   11:08 AM  Readmission Risk Prevention Plan  Transportation Screening Complete Complete Complete  PCP or Specialist Appt within 3-5 Days  Complete   HRI or Home Care Consult Complete Complete   Social Work Consult for Recovery Care Planning/Counseling Complete Complete   Palliative Care Screening Not Applicable Not Applicable   Medication Review Oceanographer) Complete Complete Complete  PCP or Specialist appointment within 3-5 days of discharge   Complete  HRI or Home Care  Consult   Complete  SW Recovery Care/Counseling Consult   Complete  Palliative Care Screening   Complete  Skilled Nursing Facility   Complete

## 2023-05-02 NOTE — Progress Notes (Signed)
 Heart Failure Navigator Progress Note  Assessed for Heart & Vascular TOC clinic readiness.  Patient does not meet criteria due to EF 55-60%, per MD note "not heart failure this admission and patient with chronic dementia". No HF TOC .   Navigator will sign off at this time.   Rhae Hammock, BSN, Scientist, clinical (histocompatibility and immunogenetics) Only

## 2023-05-02 NOTE — Consult Note (Signed)
 [facility]                                                                                        Patient Identification: Patient Name: [name] MRN: [id number] Admit Date: 04/29/2023  4:01 PM Today's Date: 05/02/2023 Reason for consult: GNR bacteremia  Requesting provider: Dr Maryfrances Bunnell  Principal Problem:   ESBL E. coli bacteremia Active Problems:   SARCOIDOSIS, PULMONARY   CAD (coronary artery disease)   Essential hypertension   COPD (chronic obstructive pulmonary disease) (HCC)   Chronic diastolic CHF (congestive heart failure) (HCC)   Paroxysmal atrial fibrillation (HCC)   Dementia   Hypothyroidism   Recurrent UTI   Hypomagnesemia   Antibiotics:  Azithromycin 3/8 Ceftriaxone 3/8-3/9 Doxycycline 3/9 Meropenem 3/9-  Lines/Hardware:ORIF rt olecranon #, Left subtrochanteric # with medullary fixation, Left reverse shoulder arthroplasty, rt hip replacement - no concerns here  Assessment # ESBL E. coli bacteremia -Prior E. coli bacteremia in February 2025, May 2021 -Presumed source is GU, but denies having obvious GU or GI symptoms.  -No concerns at any sites of her hardware -3/9 TTE no obvious vegetation or endocarditis, mild thickening of the aortic valve  # Bactrim allergy - she tolerated bactrim OK last admission and allergy has been removed   Recommendations  -Will switch meropenem to ertapenem for now -I will order CT abdomen pelvis with contrast due to unclear cause of bacteremia -If CT abdomen pelvis unremarkable, will switch to Bactrim PO and possibly 14 days for unclear source/recurrent bacteremia - Monitor CBC and CMP on abtx - Contact precautions  - d/w primary team Dr Drue Second on tomorrow.   Rest of the management as per the primary team. Please call with questions or concerns.  Thank you for the  consult  __________________________________________________________________________________________________________ HPI and Hospital Course: 89 Y O female with multiple co-morbidities as below including Pulmonary sarcoidosis not on active tx as below with recent ESBL E. coli bacteremia/ pyelonephritis with kidney stone s/p treatment who initially presented to ED on 3/8 with cough, sore throat and malaise/weakness.  Initial saturation was 79% which later improved Initial concerns for PNA and CHF.  Denied fever, sick contacts work up + for ESBL E coli bacteremia with PNA and CHF being ruled out.  At ED, afebrile Labs remarkable for WBC 25.3, BNP elevated to 598, hemoglobin 11.9 UA with large leukocytes, rare bacteria, negative nitrites, urine culture no growth( received one dose of meropenem prior) Chest x-ray mild central pulmonary vascular congestion with minimal possible bilateral pulmonary edema Was given Lasix for concern for CHF as well as ceftriaxone and azithromycin for CAP coverage ID consulted for ESBL E coli E. coli bacteremia  ROS: General- Denies fever, chills, loss of appetite and loss of weight HEENT - Denies headache, blurry vision, neck pain, sinus pain Chest - Denies any chest pain, SOB or cough CVS- Denies any dizziness/lightheadedness, syncopal attacks, palpitations Abdomen- Denies any vomiting, abdominal pain, hematochezia and diarrhea Neuro - Denies any weakness, numbness, tingling sensation Psych - Denies any changes in mood irritability or depressive symptoms GU- Denies any burning, dysuria, hematuria or increased frequency of  urination Skin - denies any rashes/lesions MSK - denies any joint pain/swelling or restricted ROM   Past Medical History:  Diagnosis Date   Acute maxillary sinusitis 10/17/2017   Acute posthemorrhagic anemia 08/22/2012   Acute renal failure syndrome (HCC) 01/04/2019   Allergy    SEASONAL   Anemia    Aortic atherosclerosis (HCC)     Arthritis    Asthma    "slight"    Baker's cyst of knee, right    CAD (coronary artery disease)    Carotid stenosis    Closed fracture of right olecranon process 05/09/2017   Closed left subtrochanteric femur fracture S/P Open/closed and reduction, internal medullary fixation  09/05/2012   Dyspnea    due to Sarcoidosis. When is windy or humed   Edema 10/30/2019   Elbow fracture 04/2017   Right, had surgery   Esophageal reflux    Fracture of inferior pubic ramus (HCC) 02/28/2019   Hematochezia    Hip fracture (HCC) 12/12/2018   HLD (hyperlipidemia)    Hx of colonoscopy    Hydronephrosis 07/29/2019   Left foot pain 09/02/2014   Assessment: 89 year old with acquired leg length discrepancy on the left who presents with 10 days of acute onset left foot pain. She seemed to be doing well prior to this pain with her orthotics that had been made to correct her leg length discrepancy. Negative x-rays at outside office which were again reviewed today.   This pain does not seem to impair her function and which has actually been im   Macular pucker, right eye    will have removed on 01/26/21   Osteoporosis    Paroxysmal supraventricular tachycardia (HCC)    Pneumonia    Pyelonephritis 07/29/2019   Sacral insufficiency fracture 12/12/2018   Sarcoidosis    Sepsis (HCC) 07/30/2019   Sepsis due to Escherichia coli (e. 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   COLONOSCOPY     CYSTOSCOPY W/ RETROGRADES Left 07/30/2019   Procedure: CYSTOSCOPY WITH RETROGRADE PYELOGRAM LEFT  STENT PLACEMENT;  Surgeon: Crist Fat, MD;  Location: [facility] ORS;  Service: Urology;  Laterality: Left;   CYSTOSCOPY WITH RETROGRADE PYELOGRAM, URETEROSCOPY AND STENT PLACEMENT Left 08/20/2019   Procedure: CYSTOSCOPY, URETEROSCOPY AND STENT PLACEMENT;  Surgeon: Noel Christmas, MD;  Location: [facility];  Service: Urology;  Laterality: Left;  1 HR   FOOT SURGERY Right    HIP SURGERY Right 2011   full replacement   HOLMIUM LASER APPLICATION Left 08/20/2019   Procedure: HOLMIUM LASER APPLICATION;  Surgeon: Noel Christmas, MD;  Location: [facility];  Service: Urology;  Laterality: Left;   LEFT HEART CATH AND CORONARY ANGIOGRAPHY N/A 08/09/2021   Procedure: LEFT HEART CATH AND CORONARY ANGIOGRAPHY;  Surgeon: Tonny Bollman, MD;  Location: [facility] INVASIVE CV LAB;  Service: Cardiovascular;  Laterality: N/A;   LEG SURGERY Left 06/2012   femur fracture s/p open and closed reduction in [place], Dr. Mayford Knife   lens replacement Right    right eye   ORIF ELBOW FRACTURE Right 05/09/2017  Procedure: ORIF right olecranon fracture with repair/reconstruction, ulnar nerve transposition as needed;  Surgeon: Dominica Severin, MD;  Location: [facility] OR;  Service: Orthopedics;  Laterality: Right;  Requests 90 mins   pelvis fracture     POLYPECTOMY     REVERSE SHOULDER ARTHROPLASTY Left 06/13/2019   Procedure: REVERSE SHOULDER ARTHROPLASTY;  Surgeon: Francena Hanly, MD;  Location: [facility];  Service: Orthopedics;  Laterality: Left;    SHOULDER ARTHROSCOPY W/ ROTATOR CUFF REPAIR Right    TRAPEZIUM RESECTION Right    Scheduled Meds:  apixaban  2.5 mg Oral BID   ascorbic acid  500 mg Oral Daily   ferrous sulfate  325 mg Oral QODAY   guaiFENesin  1,200 mg Oral BID   levothyroxine  100 mcg Oral Daily   sertraline  50 mg Oral QHS   sodium chloride flush  3 mL Intravenous Q12H   thiamine  100 mg Oral Daily   Continuous Infusions:  meropenem (MERREM) IV 1 g (05/02/23 0640)   PRN Meds:.acetaminophen,  ipratropium-albuterol, metoCLOPramide, ondansetron (ZOFRAN) IV, oxyCODONE, sodium chloride flush  Allergies  Allergen Reactions   Levaquin [Levofloxacin] Other (See Comments)    Joint pain.. Per doctor, told to not to take again and "allergic," per [facility]   Oxycodone Nausea And Vomiting and Other (See Comments)    Can take oxycodone with Zofran (otherwise, GI Intolerance) "Allergic," per [facility]   Oxycodone-Acetaminophen Other (See Comments)    GI Intolerance and "talking out of my head" and "allergic," per [facility]   Sulfa Antibiotics Other (See Comments)    Possibly caused hepatitis and "allergic," per [facility]   Morphine Nausea Only and Other (See Comments)    GI intolerance and "allergic," per [facility]   Social History   Socioeconomic History   Marital status: Widowed    Spouse name: Not on file   Number of children: 1   Years of education: Not on file   Highest education level: Some college, no degree  Occupational History   Occupation: retired    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  Tobacco Use   Smoking status: Never   Smokeless tobacco: Never  Vaping Use   Vaping status: Never Used  Substance and Sexual Activity   Alcohol use: No   Drug use: No   Sexual activity: Not Currently    Partners: Male    Birth control/protection: Post-menopausal  Other Topics Concern   Not on file  Social History Narrative   Lives with daughter and son in law   decaffeinated  use:  Coffee 1 per day   Right handed   Social Drivers of Health   Financial Resource Strain: Not on file  Food Insecurity: No Food Insecurity (04/29/2023)   Hunger Vital Sign    Worried About Running Out of Food in the Last Year: Never true    Ran Out of Food in the Last Year: Never true  Transportation Needs: No Transportation Needs (04/29/2023)   PRAPARE - Administrator, Civil Service (Medical): No    Lack of Transportation (Non-Medical): No  Physical Activity: Not on file  Stress: Not  on file  Social Connections: Patient Unable To Answer (04/29/2023)   Social Connection and Isolation Panel [NHANES]    Frequency of Communication with Friends and Family: Patient unable to answer    Frequency of Social Gatherings with Friends and Family: Patient unable to answer    Attends Religious Services: Patient unable to answer  Active Member of Clubs or Organizations: Patient unable to answer    Attends Club or Organization Meetings: Patient unable to answer    Marital Status: Patient unable to answer  Intimate Partner Violence: Patient Unable To Answer (04/29/2023)   Humiliation, Afraid, Rape, and Kick questionnaire    Fear of Current or Ex-Partner: Patient unable to answer    Emotionally Abused: Patient unable to answer    Physically Abused: Patient unable to answer    Sexually Abused: Patient unable to answer   Family History  Problem Relation Age of Onset   Colon cancer Mother 80   Cancer Mother    Lung cancer Father    Heart disease Father    Arrhythmia Father    Cancer Father    Cancer Sister        ovarian   Heart attack Brother    Esophageal cancer Neg Hx    Rectal cancer Neg Hx    Stomach cancer Neg Hx    Stroke Neg Hx     Vitals BP 135/65 (BP Location: Left Arm)   Pulse 66   Temp 98 F (36.7 C)   Resp 16   Ht 5\' 3"  (1.6 m)   Wt 55.1 kg   LMP 02/22/1983 (Approximate)   SpO2 98%   BMI 21.52 kg/m    Physical Exam Constitutional: Elderly female, lying in the bed, not in acute distress    Comments: HEENT WNL  Cardiovascular:     Rate and Rhythm: Normal rate and regular rhythm.     Heart sounds: S1 and S2  Pulmonary:     Effort: Pulmonary effort is normal.     Comments: Normal breath sounds  Abdominal:     Palpations: Abdomen is soft.     Tenderness: Not distended and nontender  Musculoskeletal:        General: No swelling or tenderness peripheral joints including places with hardware.   Skin:    Comments: No rashes  Neurological: Awake,  alert and oriented, seems somewhat forgetful but follows simple commands appropriately  Psychiatric:        Mood and Affect: Mood normal.    Pertinent Microbiology Results for orders placed or performed during the hospital encounter of 04/29/23  Resp panel by RT-PCR (RSV, Flu A&B, Covid) Anterior Nasal Swab     Status: None   Collection Time: 04/29/23  4:11 PM   Specimen: Anterior Nasal Swab  Result Value Ref Range Status   SARS Coronavirus 2 by RT PCR NEGATIVE NEGATIVE Final    Comment: (NOTE) SARS-CoV-2 target nucleic acids are NOT DETECTED.  The SARS-CoV-2 RNA is generally detectable in upper respiratory specimens during the acute phase of infection. The lowest concentration of SARS-CoV-2 viral copies this assay can detect is 138 copies/mL. A negative result does not preclude SARS-Cov-2 infection and should not be used as the sole basis for treatment or other patient management decisions. A negative result may occur with  improper specimen collection/handling, submission of specimen other than nasopharyngeal swab, presence of viral mutation(s) within the areas targeted by this assay, and inadequate number of viral copies(<138 copies/mL). A negative result must be combined with clinical observations, patient history, and epidemiological information. The expected result is Negative.  Fact Sheet for Patients:  BloggerCourse.com  Fact Sheet for Healthcare Providers:  SeriousBroker.it  This test is no t yet approved or cleared by the [place] FDA and  has been authorized for detection and/or diagnosis of SARS-CoV-2 by FDA under an Emergency  Use Authorization (EUA). This EUA will remain  in effect (meaning this test can be used) for the duration of the COVID-19 declaration under Section 564(b)(1) of the Act, 21 U.S.C.section 360bbb-3(b)(1), unless the authorization is terminated  or revoked sooner.       Influenza A by  PCR NEGATIVE NEGATIVE Final   Influenza B by PCR NEGATIVE NEGATIVE Final    Comment: (NOTE) The Xpert Xpress SARS-CoV-2/FLU/RSV plus assay is intended as an aid in the diagnosis of influenza from Nasopharyngeal swab specimens and should not be used as a sole basis for treatment. Nasal washings and aspirates are unacceptable for Xpert Xpress SARS-CoV-2/FLU/RSV testing.  Fact Sheet for Patients: BloggerCourse.com  Fact Sheet for Healthcare Providers: SeriousBroker.it  This test is not yet approved or cleared by the [place] FDA and has been authorized for detection and/or diagnosis of SARS-CoV-2 by FDA under an Emergency Use Authorization (EUA). This EUA will remain in effect (meaning this test can be used) for the duration of the COVID-19 declaration under Section 564(b)(1) of the Act, 21 U.S.C. section 360bbb-3(b)(1), unless the authorization is terminated or revoked.     Resp Syncytial Virus by PCR NEGATIVE NEGATIVE Final    Comment: (NOTE) Fact Sheet for Patients: BloggerCourse.com  Fact Sheet for Healthcare Providers: SeriousBroker.it  This test is not yet approved or cleared by the [place] FDA and has been authorized for detection and/or diagnosis of SARS-CoV-2 by FDA under an Emergency Use Authorization (EUA). This EUA will remain in effect (meaning this test can be used) for the duration of the COVID-19 declaration under Section 564(b)(1) of the Act, 21 U.S.C. section 360bbb-3(b)(1), unless the authorization is terminated or revoked.  Performed at [facility], [address], [place], [place] [address]   Blood culture (routine x 2)     Status: Abnormal   Collection Time: 04/29/23  6:34 PM   Specimen: BLOOD  Result Value Ref Range Status   Specimen Description   Final    BLOOD LEFT ANTECUBITAL Performed at [facility], [address], [place], [place] [address]    Special Requests   Final    BOTTLES DRAWN AEROBIC AND ANAEROBIC Blood Culture results may not be optimal due to an inadequate volume of blood received in culture bottles Performed at [facility], [address] [place], [place] [address]    Culture  Setup Time   Final    GRAM NEGATIVE RODS IN BOTH AEROBIC AND ANAEROBIC BOTTLES CRITICAL RESULT CALLED TO, READ BACK BY AND VERIFIED WITH: PHARMD SYDNEY DAVIS [id number] AT 1630 BY EC Performed at [facility], [address], [place], [place] [address]    Culture (A)  Final    ESCHERICHIA COLI Confirmed Extended Spectrum Beta-Lactamase Producer (ESBL).  In bloodstream infections from ESBL organisms, carbapenems are preferred over piperacillin/tazobactam. They are shown to have a lower risk of mortality.    Report Status 05/02/2023 FINAL  Final   Organism ID, Bacteria ESCHERICHIA COLI  Final      Susceptibility   Escherichia coli - MIC*    AMPICILLIN >=32 RESISTANT Resistant     CEFEPIME >=32 RESISTANT Resistant     CEFTAZIDIME RESISTANT Resistant     CEFTRIAXONE >=64 RESISTANT Resistant     CIPROFLOXACIN >=4 RESISTANT Resistant     GENTAMICIN >=16 RESISTANT Resistant     IMIPENEM <=0.25 SENSITIVE Sensitive     TRIMETH/SULFA <=20 SENSITIVE Sensitive     AMPICILLIN/SULBACTAM >=32 RESISTANT  Resistant     PIP/TAZO 8 SENSITIVE Sensitive ug/mL    * ESCHERICHIA COLI  Blood Culture ID Panel (Reflexed)     Status: Abnormal   Collection Time: 04/29/23  6:34 PM  Result Value Ref Range Status   Enterococcus faecalis NOT DETECTED NOT DETECTED Final   Enterococcus Faecium NOT DETECTED NOT DETECTED Final   Listeria monocytogenes NOT DETECTED NOT DETECTED Final   Staphylococcus species NOT DETECTED NOT DETECTED Final   Staphylococcus aureus (BCID) NOT DETECTED NOT DETECTED Final   Staphylococcus epidermidis NOT DETECTED NOT DETECTED Final   Staphylococcus lugdunensis  NOT DETECTED NOT DETECTED Final   Streptococcus species NOT DETECTED NOT DETECTED Final   Streptococcus agalactiae NOT DETECTED NOT DETECTED Final   Streptococcus pneumoniae NOT DETECTED NOT DETECTED Final   Streptococcus pyogenes NOT DETECTED NOT DETECTED Final   A.calcoaceticus-baumannii NOT DETECTED NOT DETECTED Final   Bacteroides fragilis NOT DETECTED NOT DETECTED Final   Enterobacterales DETECTED (A) NOT DETECTED Final    Comment: Enterobacterales represent a large order of gram negative bacteria, not a single organism. CRITICAL RESULT CALLED TO, READ BACK BY AND VERIFIED WITH: PHARMD SYDNEY DAVIS [id number] AT 1630 BY EC    Enterobacter cloacae complex NOT DETECTED NOT DETECTED Final   Escherichia coli DETECTED (A) NOT DETECTED Final    Comment: CRITICAL RESULT CALLED TO, READ BACK BY AND VERIFIED WITH: PHARMD SYDNEY DAVIS [id number] AT 1630 BY EC    Klebsiella aerogenes NOT DETECTED NOT DETECTED Final   Klebsiella oxytoca NOT DETECTED NOT DETECTED Final   Klebsiella pneumoniae NOT DETECTED NOT DETECTED Final   Proteus species NOT DETECTED NOT DETECTED Final   Salmonella species NOT DETECTED NOT DETECTED Final   Serratia marcescens NOT DETECTED NOT DETECTED Final   Haemophilus influenzae NOT DETECTED NOT DETECTED Final   Neisseria meningitidis NOT DETECTED NOT DETECTED Final   Pseudomonas aeruginosa NOT DETECTED NOT DETECTED Final   Stenotrophomonas maltophilia NOT DETECTED NOT DETECTED Final   Candida albicans NOT DETECTED NOT DETECTED Final   Candida auris NOT DETECTED NOT DETECTED Final   Candida glabrata NOT DETECTED NOT DETECTED Final   Candida krusei NOT DETECTED NOT DETECTED Final   Candida parapsilosis NOT DETECTED NOT DETECTED Final   Candida tropicalis NOT DETECTED NOT DETECTED Final   Cryptococcus neoformans/gattii NOT DETECTED NOT DETECTED Final   CTX-M ESBL DETECTED (A) NOT DETECTED Final    Comment: CRITICAL RESULT CALLED TO, READ BACK BY AND VERIFIED WITH: PHARMD  SYDNEY DAVIS [id number] AT 1630 BY EC (NOTE) Extended spectrum beta-lactamase detected. Recommend a carbapenem as initial therapy.      Carbapenem resistance IMP NOT DETECTED NOT DETECTED Final   Carbapenem resistance KPC NOT DETECTED NOT DETECTED Final   Carbapenem resistance NDM NOT DETECTED NOT DETECTED Final   Carbapenem resist OXA 48 LIKE NOT DETECTED NOT DETECTED Final   Carbapenem resistance VIM NOT DETECTED NOT DETECTED Final    Comment: Performed at [facility], [address] [place], [place] [address]  Blood culture (routine x 2)     Status: Abnormal   Collection Time: 04/29/23  8:42 PM   Specimen: BLOOD  Result Value Ref Range Status   Specimen Description   Final    BLOOD BLOOD RIGHT ARM AEROBIC BOTTLE ONLY Performed at [facility], [address], [place], [place] [address]    Special Requests   Final    Blood Culture results may not be optimal due to an inadequate volume of blood received  in culture bottles Performed at [facility], [address] [place], [place] [address]    Culture  Setup Time   Final    GRAM NEGATIVE RODS AEROBIC BOTTLE ONLY CRITICAL VALUE NOTED.  VALUE IS CONSISTENT WITH PREVIOUSLY REPORTED AND CALLED VALUE.    Culture (A)  Final    ESCHERICHIA COLI SUSCEPTIBILITIES PERFORMED ON PREVIOUS CULTURE WITHIN THE LAST 5 DAYS. Performed at [facility], [address], [place], [place] [address]    Report Status 05/02/2023 FINAL  Final  Urine Culture     Status: None   Collection Time: 04/30/23  6:53 PM   Specimen: Urine, Random  Result Value Ref Range Status   Specimen Description   Final    URINE, RANDOM Performed at [facility], [address] [place], [place] [address]    Special Requests   Final    NONE Reflexed from [id number] Performed at [facility], [address] [place], [place] [address]    Culture   Final    NO GROWTH Performed at [facility], [address], [place], [place] [address]    Report Status 05/01/2023 FINAL  Final   Pertinent Lab seen by me:    Latest Ref Rng & Units 05/02/2023    3:58 AM 04/29/2023    4:06 PM 04/05/2023    6:07 AM  CBC  WBC 4.0 - 10.5 K/uL 16.9  25.3  15.0   Hemoglobin 12.0 - 15.0 g/dL 62.9  52.8  41.3   Hematocrit 36.0 - 46.0 % 34.7  36.9  39.8   Platelets 150 - 400 K/uL 201  212  285       Latest Ref Rng & Units 05/02/2023    3:58 AM 05/01/2023    3:49 AM 04/30/2023    4:23 AM  CMP  Glucose 70 - 99 mg/dL 244  010  272   BUN 8 - 23 mg/dL 19  17  15    Creatinine 0.44 - 1.00 mg/dL 5.36  6.44  0.34   Sodium 135 - 145 mmol/L 132  131  137   Potassium 3.5 - 5.1 mmol/L 3.5  2.2  3.0   Chloride 98 - 111 mmol/L 102  97  103   CO2 22 - 32 mmol/L 24  23  23    Calcium 8.9 - 10.3 mg/dL 7.9  7.7  8.3     Pertinent Imagings/Other Imagings Plain films and CT images have been personally visualized and interpreted; radiology reports have been reviewed. Decision making incorporated into the Impression / Recommendations.  ECHOCARDIOGRAM COMPLETE Result Date: 04/30/2023    ECHOCARDIOGRAM REPORT   Patient Name:   [name] Date of Exam: 04/30/2023 Medical Rec #:  [id number]        Height:       63.0 in Accession #:    [id number]       Weight:       119.0 lb Date of Birth:  1934/09/01        BSA:          1.551 m Patient Age:    89 years         BP:           109/78 mmHg Patient Gender: F                HR:           78 bpm. Exam Location:  Inpatient  Procedure: 2D Echo, Cardiac Doppler and Color Doppler (Both Spectral and Color            Flow Doppler were utilized during procedure). Indications:    CHF  History:        Patient has prior history of Echocardiogram examinations, most                 recent 05/25/2022. CHF, CAD, COPD; Risk Factors:Hypertension.  Sonographer:    Amy Chionchio Referring Phys: [id number] MOHAMMAD L GARBA IMPRESSIONS  1. Left ventricular ejection fraction, by estimation, is 55 to  60%. The left ventricle has normal function. The left ventricle has no regional wall motion abnormalities. Left ventricular diastolic parameters are indeterminate.  2. Right ventricular systolic function is normal. The right ventricular size is normal. There is mildly elevated pulmonary artery systolic pressure.  3. The mitral valve is normal in structure. Mild mitral valve regurgitation. No evidence of mitral stenosis.  4. The tricuspid valve is abnormal.  5. The aortic valve is tricuspid. There is mild calcification of the aortic valve. There is mild thickening of the aortic valve. Aortic valve regurgitation is not visualized. No aortic stenosis is present.  6. The inferior vena cava is dilated in size with >50% respiratory variability, suggesting right atrial pressure of 8 mmHg. FINDINGS  Left Ventricle: Left ventricular ejection fraction, by estimation, is 55 to 60%. The left ventricle has normal function. The left ventricle has no regional wall motion abnormalities. The left ventricular internal cavity size was normal in size. There is  no left ventricular hypertrophy. Left ventricular diastolic parameters are indeterminate. Right Ventricle: The right ventricular size is normal. Right vetricular wall thickness was not well visualized. Right ventricular systolic function is normal. There is mildly elevated pulmonary artery systolic pressure. The tricuspid regurgitant velocity  is 2.92 m/s, and with an assumed right atrial pressure of 8 mmHg, the estimated right ventricular systolic pressure is 42.1 mmHg. Left Atrium: Left atrial size was normal in size. Right Atrium: Right atrial size was normal in size. Pericardium: There is no evidence of pericardial effusion. Mitral Valve: The mitral valve is normal in structure. There is mild thickening of the mitral valve leaflet(s). There is mild calcification of the mitral valve leaflet(s). Mild mitral annular calcification. Mild mitral valve regurgitation. No evidence of   mitral valve stenosis. MV peak gradient, 3.2 mmHg. The mean mitral valve gradient is 2.0 mmHg. Tricuspid Valve: The tricuspid valve is abnormal. Tricuspid valve regurgitation is mild . No evidence of tricuspid stenosis. Aortic Valve: The aortic valve is tricuspid. There is mild calcification of the aortic valve. There is mild thickening of the aortic valve. There is mild aortic valve annular calcification. Aortic valve regurgitation is not visualized. No aortic stenosis  is present. Aortic valve mean gradient measures 5.0 mmHg. Aortic valve peak gradient measures 8.4 mmHg. Aortic valve area, by VTI measures 1.87 cm. Pulmonic Valve: The pulmonic valve was not well visualized. Pulmonic valve regurgitation is not visualized. No evidence of pulmonic stenosis. Aorta: The aortic root and ascending aorta are structurally normal, with no evidence of dilitation. Venous: The inferior vena cava is dilated in size with greater than 50% respiratory variability, suggesting right atrial pressure of 8 mmHg. IAS/Shunts: No atrial level shunt detected by color flow Doppler.  LEFT VENTRICLE PLAX 2D LVIDd:         4.20 cm     Diastology LVIDs:         2.70 cm  LV e' medial:    6.09 cm/s LV PW:         0.90 cm     LV E/e' medial:  12.9 LV IVS:        0.90 cm     LV e' lateral:   7.83 cm/s LVOT diam:     1.90 cm     LV E/e' lateral: 10.1 LV SV:         50 LV SV Index:   32 LVOT Area:     2.84 cm  LV Volumes (MOD) LV vol d, MOD A2C: 55.7 ml LV vol d, MOD A4C: 61.6 ml LV vol s, MOD A2C: 19.9 ml LV vol s, MOD A4C: 17.9 ml LV SV MOD A2C:     35.8 ml LV SV MOD A4C:     61.6 ml LV SV MOD BP:      38.8 ml RIGHT VENTRICLE             IVC RV Basal diam:  4.10 cm     IVC diam: 2.40 cm RV Mid diam:    3.30 cm RV S prime:     13.80 cm/s TAPSE (M-mode): 2.1 cm LEFT ATRIUM             Index        RIGHT ATRIUM           Index LA Vol (A2C):   46.4 ml 29.92 ml/m  RA Area:     14.50 cm LA Vol (A4C):   50.1 ml 32.30 ml/m  RA Volume:   36.50 ml   23.53 ml/m LA Biplane Vol: 49.2 ml 31.72 ml/m  AORTIC VALVE                    PULMONIC VALVE AV Area (Vmax):    2.17 cm     PV Vmax:       0.95 m/s AV Area (Vmean):   1.85 cm     PV Peak grad:  3.6 mmHg AV Area (VTI):     1.87 cm AV Vmax:           145.00 cm/s AV Vmean:          99.800 cm/s AV VTI:            0.269 m AV Peak Grad:      8.4 mmHg AV Mean Grad:      5.0 mmHg LVOT Vmax:         111.00 cm/s LVOT Vmean:        65.100 cm/s LVOT VTI:          0.177 m LVOT/AV VTI ratio: 0.66  AORTA Ao Root diam: 2.60 cm Ao Asc diam:  2.50 cm MITRAL VALVE               TRICUSPID VALVE MV Area (PHT): 3.17 cm    TR Peak grad:   34.1 mmHg MV Area VTI:   2.05 cm    TR Vmax:        292.00 cm/s MV Peak grad:  3.2 mmHg MV Mean grad:  2.0 mmHg    SHUNTS MV Vmax:       0.90 m/s    Systemic VTI:  0.18 m MV Vmean:      59.1 cm/s   Systemic Diam: 1.90 cm MV Decel Time: 239 msec MV E velocity: 78.80 cm/s MV A velocity: 81.50 cm/s MV E/A ratio:  0.97 Dina Rich MD Electronically signed  by Dina Rich MD Signature Date/Time: 04/30/2023/3:22:29 PM    Final    DG Chest 2 View Result Date: 04/29/2023 CLINICAL DATA:  Shortness of breath. EXAM: CHEST - 2 VIEW COMPARISON:  April 03, 2023. FINDINGS: The heart size and mediastinal contours are within normal limits. Status post left shoulder arthroplasty. Mild central pulmonary vascular congestion is noted with minimal possible pulmonary edema. Minimal pleural effusions are noted. IMPRESSION: Mild central pulmonary vascular congestion is noted with minimal possible bilateral pulmonary edema. Electronically Signed   By: Lupita Raider M.D.   On: 04/29/2023 16:48   DG CHEST PORT 1 VIEW Result Date: 04/03/2023 CLINICAL DATA:  Shortness of breath. EXAM: PORTABLE CHEST 1 VIEW COMPARISON:  03/30/2023 and CT chest 04/18/2022. FINDINGS: Patient is rotated. Trachea is midline. Heart is enlarged, stable. Thoracic aorta is calcified. Bibasilar airspace opacification. Probable small  bilateral pleural effusions. IMPRESSION: Suspect mild congestive heart failure. Possible bibasilar atelectasis. Electronically Signed   By: Leanna Battles M.D.   On: 04/03/2023 14:12   MR BRAIN WO CONTRAST Addendum Date: 04/02/2023 ADDENDUM REPORT: 04/02/2023 12:13 ADDENDUM: The report was inadvertently finalized before images were available. FINDINGS: Brain: No acute infarct, hemorrhage, or mass lesion is present. Moderate atrophy and white matter disease is present bilaterally. The ventricles are proportionate to the degree of atrophy. No significant extraaxial fluid collection is present. Deep brain nuclei are within normal limits. Remote lacunar infarcts are present in the cerebellum bilaterally, right greater than left. The brainstem and cerebellum are otherwise within normal limits. The internal auditory canals are within normal limits. Midline structures are within normal limits. Vascular: Flow is present in the major intracranial arteries. Skull and upper cervical spine: The craniocervical junction is normal. Upper cervical spine is within normal limits. Marrow signal is unremarkable. Sinuses/orbits: The paranasal sinuses and mastoid air cells are clear. Bilateral lens replacements are noted. Globes and orbits are otherwise unremarkable. Impressions: 1. Stable appearance of moderate atrophy and diffuse white matter disease likely reflects the sequela of chronic microvascular ischemia. 2. No acute intracranial abnormality. Electronically Signed   By: Marin Roberts M.D.   On: 04/02/2023 12:13   Result Date: 04/02/2023 CLINICAL DATA:  Altered mental status. Confusion. EXAM: MRI HEAD WITHOUT CONTRAST TECHNIQUE: Multiplanar, multiecho pulse sequences of the brain and surrounding structures were obtained without intravenous contrast. COMPARISON:  None Available. FINDINGS: Brain: Vascular: Skull and upper cervical spine: Sinuses/Orbits: Other: Electronically Signed: By: Marin Roberts M.D. On:  04/02/2023 11:49   I have personally spent 83 minutes involved in face-to-face and non-face-to-face activities for this patient on the day of the visit. Professional time spent includes the following activities: Preparing to see the patient (review of tests), Obtaining and/or reviewing separately obtained history (admission/discharge record), Performing a medically appropriate examination and/or evaluation , Ordering medications/tests/procedures, referring and communicating with other health care professionals, Documenting clinical information in the EMR, Independently interpreting results (not separately reported), Communicating results to the patient/family/caregiver, Counseling and educating the patient/family/caregiver and Care coordination (not separately reported).  Electronically signed by:   Plan d/w requesting provider as well as ID pharm D  Of note, portions of this note may have been created with voice recognition software. While this note has been edited for accuracy, occasional wrong-word or 'sound-a-like' substitutions may have occurred due to the inherent limitations of voice recognition software.   Odette Fraction, MD Infectious Disease Physician [facility] Pager: [phone]

## 2023-05-02 NOTE — Progress Notes (Signed)
  Progress Note   Patient: [name] XBM:[id number] DOB: November 19, 1934 DOA: 04/29/2023     3 DOS: the patient was seen and examined on 05/02/2023 at 10:29AM      Brief hospital course: 89 y.o. F with dCHF, CAD, pAF on Eliquis, Sarcoid quiescent, recent ESBL pyelonephritis with ureteral stone who presented with malaise, cough.  In the ER, diagnosed with pneumonia and CHF and admitted.  Blood cultures subsequently growing ESBL.     Assessment and Plan: * ESBL E. coli bacteremia Pneumonia ruled out.  Respiratory failure ruled out.  Had dyspnea and transient hypoxia.    Unclear source.  Urinalysis with bacteria but urine culture (obtained 50 minutes after first mero dose) no growth.    Has multiple ortho hardwares, none with increased symptoms.  Echo without obvious vegetation.  Given this is her 3rd episode of E coli bacteremia (2nd in 1 month), ID have recommended CT abdomen and pelvis.    Tolerated Bactrim well during last admission.  Will need to clearly communicate this to [facility], as they have an outdated allergy list that includes Sulfa.    - Consult ID, appreciate expertise - Continue carbapenem day 3    Paroxysmal atrial fibrillation (HCC) Rate controlled - Continue Eliquis  SARCOIDOSIS, PULMONARY History of.  No active treatment at this time.   Recurrent UTI - Hold methenamine - See above  Hypothyroidism TSH normal last month - Continue levothyroxine  Dementia - Continue sertraline  Chronic diastolic CHF (congestive heart failure) (HCC) Acute CHF flare ruled out.  Appears euvolemic to dehydrated. - Hold home furosemide  COPD (chronic obstructive pulmonary disease) (HCC) Exacerbation ruled out.  Reports dyspnea but SpO2 normal.  No wheezing. - Continue Trelegy  Essential hypertension BP slightly up - Hold furosemide  CAD (coronary artery disease) - Continue Eliquis          Subjective: No change.  Tired.  Pretty nauseated.  No focal  abdominal pain.  No focal joint pain.     Physical Exam: BP (!) 147/48 (BP Location: Left Arm)   Pulse 63   Temp 97.6 F (36.4 C) (Oral)   Resp 17   Ht 5\' 3"  (1.6 m)   Wt 55.1 kg   LMP 02/22/1983 (Approximate)   SpO2 99%   BMI 21.52 kg/m   Frail elderly female, lying in bed, appears weak and tired RRR, no murmurs, no peripheral edema Respiratory rate normal, lungs clear without rales or wheezes Abdomen soft without tenderness palpation or guarding, no ascites or distention Attention normal, affect normal, judgment and insight appear impaired but at baseline.  Generally weak    Data Reviewed: Discussed with infectious disease Basic metabolic panel shows mild hyponatremia CT shows improving leukocytosis, stable anemia Blood culture shows sensitivity to Bactrim, carbapenems  Family Communication: Daughter at the bedside    Disposition: Status is: Inpatient The patient was admitted with malaise.  She was found to have E. coli/ESBL bacteremia for second time in 2 months  ID were consulted, CT abdomen and pelvis is pending  If she requires midline and IV antibiotics she will need SNF level of care, if ID recmomend oral antibiotics, she will likely dc to her [facility]        Author: Alberteen Sam, MD 05/02/2023 4:40 PM  For on call review [email or web address].

## 2023-05-02 NOTE — Plan of Care (Signed)

## 2023-05-03 ENCOUNTER — Ambulatory Visit: Payer: 59 | Admitting: Nurse Practitioner

## 2023-05-03 ENCOUNTER — Other Ambulatory Visit: Payer: Self-pay

## 2023-05-03 DIAGNOSIS — I480 Paroxysmal atrial fibrillation: Secondary | ICD-10-CM | POA: Diagnosis not present

## 2023-05-03 DIAGNOSIS — N10 Acute tubulo-interstitial nephritis: Secondary | ICD-10-CM

## 2023-05-03 DIAGNOSIS — I5032 Chronic diastolic (congestive) heart failure: Secondary | ICD-10-CM

## 2023-05-03 DIAGNOSIS — Z1612 Extended spectrum beta lactamase (ESBL) resistance: Secondary | ICD-10-CM | POA: Diagnosis not present

## 2023-05-03 DIAGNOSIS — R7881 Bacteremia: Secondary | ICD-10-CM | POA: Diagnosis not present

## 2023-05-03 DIAGNOSIS — N12 Tubulo-interstitial nephritis, not specified as acute or chronic: Secondary | ICD-10-CM | POA: Diagnosis not present

## 2023-05-03 DIAGNOSIS — D869 Sarcoidosis, unspecified: Secondary | ICD-10-CM | POA: Diagnosis not present

## 2023-05-03 DIAGNOSIS — B9620 Unspecified Escherichia coli [E. coli] as the cause of diseases classified elsewhere: Secondary | ICD-10-CM | POA: Diagnosis not present

## 2023-05-03 LAB — CBC
HCT: 37.5 % (ref 36.0–46.0)
Hemoglobin: 11.9 g/dL — ABNORMAL LOW (ref 12.0–15.0)
MCH: 31 pg (ref 26.0–34.0)
MCHC: 31.7 g/dL (ref 30.0–36.0)
MCV: 97.7 fL (ref 80.0–100.0)
Platelets: 211 10*3/uL (ref 150–400)
RBC: 3.84 MIL/uL — ABNORMAL LOW (ref 3.87–5.11)
RDW: 14.9 % (ref 11.5–15.5)
WBC: 11.8 10*3/uL — ABNORMAL HIGH (ref 4.0–10.5)
nRBC: 0 % (ref 0.0–0.2)

## 2023-05-03 LAB — BASIC METABOLIC PANEL
Anion gap: 8 (ref 5–15)
BUN: 15 mg/dL (ref 8–23)
CO2: 23 mmol/L (ref 22–32)
Calcium: 7.9 mg/dL — ABNORMAL LOW (ref 8.9–10.3)
Chloride: 102 mmol/L (ref 98–111)
Creatinine, Ser: 0.56 mg/dL (ref 0.44–1.00)
GFR, Estimated: >60 mL/min (ref >60)
Glucose, Bld: 83 mg/dL (ref 70–99)
Potassium: 3.7 mmol/L (ref 3.5–5.1)
Sodium: 133 mmol/L — ABNORMAL LOW (ref 135–145)

## 2023-05-03 LAB — HEPATIC FUNCTION PANEL
ALT: 20 U/L (ref 0–44)
AST: 30 U/L (ref 15–41)
Albumin: 2.5 g/dL — ABNORMAL LOW (ref 3.5–5.0)
Alkaline Phosphatase: 80 U/L (ref 38–126)
Bilirubin, Direct: 0.1 mg/dL (ref 0.0–0.2)
Indirect Bilirubin: 0.6 mg/dL (ref 0.3–0.9)
Total Bilirubin: 0.7 mg/dL (ref 0.0–1.2)
Total Protein: 5.1 g/dL — ABNORMAL LOW (ref 6.5–8.1)

## 2023-05-03 MED ORDER — SODIUM CHLORIDE 0.9% FLUSH
10.0000 mL | Freq: Two times a day (BID) | INTRAVENOUS | Status: DC
  Administered 2023-05-03 – 2023-05-04 (×2): 10 mL

## 2023-05-03 MED ORDER — SODIUM CHLORIDE 0.9% FLUSH: 10.0000 mL | INTRAVENOUS | Status: DC | PRN

## 2023-05-03 MED ORDER — ALUM & MAG HYDROXIDE-SIMETH 200-200-20 MG/5ML PO SUSP
30.0000 mL | Freq: Three times a day (TID) | ORAL | Status: DC | PRN
  Administered 2023-05-03: 30 mL via ORAL
  Filled 2023-05-03 (×2): qty 30

## 2023-05-03 MED ORDER — ORAL CARE MOUTH RINSE: 15.0000 mL | OROMUCOSAL | Status: DC | PRN

## 2023-05-03 NOTE — Progress Notes (Signed)
 PHARMACY CONSULT NOTE FOR:  OUTPATIENT  PARENTERAL ANTIBIOTIC THERAPY (OPAT)  Indication: ESBL ecoli bacteremia Regimen: ertapenem 1g IV q24h End date: 05/13/2023  IV antibiotic discharge orders are pended. To discharging provider:  please sign these orders via discharge navigator,  Select New Orders & click on the button choice - Manage This Unsigned Work.     Thank you for allowing pharmacy to be a part of this patient's care.  [name] 05/03/2023, 2:36 PM

## 2023-05-03 NOTE — Progress Notes (Addendum)
 [facility]    Date of Admission:  04/29/2023   Total days of antibiotics 4   ID: [name] is a 89 y.o. female with  esbl ecoli bacteremia/ pyelonephritis Principal Problem:   ESBL E. coli bacteremia Active Problems:   SARCOIDOSIS, PULMONARY   CAD (coronary artery disease)   Essential hypertension   COPD (chronic obstructive pulmonary disease) (HCC)   Chronic diastolic CHF (congestive heart failure) (HCC)   Paroxysmal atrial fibrillation (HCC)   Dementia   Hypothyroidism   Recurrent UTI   Hypomagnesemia    Subjective: Afebrile. No flank pain nor dysuria. Daughter feels that she is closer to baseline  Medications:   apixaban  2.5 mg Oral BID   ascorbic acid  500 mg Oral Daily   ferrous sulfate  325 mg Oral QODAY   umeclidinium bromide  1 puff Inhalation Daily   And   fluticasone furoate-vilanterol  1 puff Inhalation Daily   guaiFENesin  1,200 mg Oral BID   levothyroxine  100 mcg Oral Daily   sertraline  50 mg Oral QHS   sodium chloride flush  3 mL Intravenous Q12H   thiamine  100 mg Oral Daily    Objective: Vital signs in last 24 hours: Temp:  [98.2 F (36.8 C)-98.6 F (37 C)] 98.2 F (36.8 C) (03/12 1253) Pulse Rate:  [60-65] 63 (03/12 1253) Resp:  [16-18] 18 (03/12 1253) BP: (151-165)/(58-64) 151/58 (03/12 1253) SpO2:  [95 %-98 %] 98 % (03/12 1253) Weight:  [56 kg] 56 kg (03/12 0500)  Physical Exam  Constitutional:  oriented to person, self.appears well-developed and well-nourished. No distress.  HENT: [place]/AT, PERRLA, no scleral icterus Mouth/Throat: Oropharynx is clear and moist. No oropharyngeal exudate.  Cardiovascular: Normal rate, regular rhythm and normal heart sounds. Exam reveals no gallop and no friction rub.  No murmur heard.  Pulmonary/Chest: Effort normal and breath sounds normal. No respiratory distress.  has no wheezes.  Neck = supple, no nuchal rigidity Abdominal: Soft. Bowel sounds are normal.  exhibits no  distension. There is no tenderness.  Lymphadenopathy: no cervical adenopathy. No axillary adenopathy Neurological: alert and oriented to person, only.  Skin: Skin is warm and dry. No rash noted. No erythema.  Psychiatric: a normal mood and affect.  behavior is normal.    Lab Results Recent Labs    05/02/23 0358 05/03/23 0420  WBC 16.9* 11.8*  HGB 11.7* 11.9*  HCT 34.7* 37.5  NA 132* 133*  K 3.5 3.7  CL 102 102  CO2 24 23  BUN 19 15  CREATININE 0.68 0.56   Liver Panel Recent Labs    05/03/23 0420  PROT 5.1*  ALBUMIN 2.5*  AST 30  ALT 20  ALKPHOS 80  BILITOT 0.7  BILIDIR 0.1  IBILI 0.6     Microbiology: Escherichia coli      MIC    AMPICILLIN >=32 RESIST... Resistant    AMPICILLIN/SULBACTAM >=32 RESIST... Resistant    CEFEPIME >=32 RESIST... Resistant    CEFTAZIDIME RESISTANT Resistant    CEFTRIAXONE >=64 RESIST... Resistant    CIPROFLOXACIN >=4 RESISTANT Resistant    GENTAMICIN >=16 RESIST... Resistant    IMIPENEM <=0.25 SENS... Sensitive    PIP/TAZO 8 SENSITIVE... Sensitive    TRIMETH/SULFA <=20 SENSIT... Sensitive    Studies/Results: [place] EKG SITE RITE Result Date: 05/03/2023 If Site Rite image not attached, placement could not be confirmed due to current cardiac rhythm.  CT ABDOMEN PELVIS W CONTRAST Result Date: 05/02/2023 CLINICAL  DATA:  Gram-negative bacteremia EXAM: CT ABDOMEN AND PELVIS WITH CONTRAST TECHNIQUE: Multidetector CT imaging of the abdomen and pelvis was performed using the standard protocol following bolus administration of intravenous contrast. RADIATION DOSE REDUCTION: This exam was performed according to the departmental dose-optimization program which includes automated exposure control, adjustment of the mA and/or kV according to patient size and/or use of iterative reconstruction technique. CONTRAST:  OMNIPAQUE IOHEXOL 300 MG/ML  SOLN COMPARISON:  03/31/2023 FINDINGS: Lower chest: Bibasilar scarring. Small left and trace right  pleural effusion. Moderate hiatal hernia. No acute abnormality. Hepatobiliary: Stable scattered hepatic cysts. Moderate hepatic steatosis. No enhancing intrahepatic mass. No intra or extrahepatic biliary ductal dilation. Gallbladder unremarkable. Pancreas: Unremarkable Spleen: Unremarkable Adrenals/Urinary Tract: The adrenal glands are unremarkable. The kidneys are normal in size and position. There is heterogeneous cortical enhancement of the left kidney with areas of ill-defined hypoenhancement in keeping with a inflammatory process, most commonly reflecting pyelonephritis, particularly given the patient's history. Several punctate 1-2 mm nonobstructing calculi are noted within the lower pole left kidney. No hydronephrosis. No additional renal or ureteral calculi. Simple cortical cyst within the right kidney noted for which no follow-up imaging is recommended. Normal cortical enhancement of the right kidney. No perinephric fluid collections. Bladder is obscured by streak artifact. Stomach/Bowel: Moderate sigmoid diverticulosis. Circumferential rectal wall thickening and presacral edema may reflect changes of an underlying proctitis. Stomach, small bowel, and large bowel are otherwise unremarkable. Appendix absent. No evidence of obstruction or focal inflammation. No free intraperitoneal gas. Mild ascites. Vascular/Lymphatic: Aortic atherosclerosis. No enlarged abdominal or pelvic lymph nodes. Reproductive: Obscured by streak artifact Other: Small fat containing umbilical and left inguinal hernias. Musculoskeletal: Right total hip arthroplasty. Left hip ORIF. Healed fractures of the pubic symphysis and inferior pubic rami bilaterally. Osseous structures are diffusely osteopenic. Superior endplate fractures L4 and L5 again noted, chronic in nature. No acute bone abnormality. IMPRESSION: 1. Heterogeneous cortical enhancement of the left kidney with areas of ill-defined hypoenhancement in keeping with a inflammatory  process, most commonly reflecting pyelonephritis, particularly given the patient's history. No hydronephrosis. No perinephric fluid collections. 2. Minimal nonobstructing left nephrolithiasis. 3. Moderate hepatic steatosis. 4. Moderate sigmoid diverticulosis. 5. Circumferential rectal wall thickening and presacral edema may reflect changes of an underlying proctitis. 6. Small left and trace right pleural effusions. Aortic Atherosclerosis (ICD10-I70.0). Electronically Signed   By: Helyn Numbers M.D.   On: 05/02/2023 22:31     Assessment/Plan: 89yo F with esbl ecoli bacteremia due to pyelonephritis on ertapenem - please get midline to finish course - she will need ertapenem 1gm IV daily through march 22nd. Will be the last day of iv abtx. Then can pull midline -continue on contact isolation while in hospital  Esbl ecoli bacteremia and pyelonephritis  Culture Result: esbl ecoli  Allergies  Allergen Reactions   Levaquin [Levofloxacin] Other (See Comments)    Joint pain.. Per doctor, told to not to take again and "allergic," per [facility]   Oxycodone Nausea And Vomiting and Other (See Comments)    Can take oxycodone with Zofran (otherwise, GI Intolerance) "Allergic," per [facility]   Oxycodone-Acetaminophen Other (See Comments)    GI Intolerance and "talking out of my head" and "allergic," per [facility]   Morphine Nausea Only and Other (See Comments)    GI intolerance and "allergic," per [facility]    OPAT Orders Discharge antibiotics to be given via PICC line Discharge antibiotics: Per pharmacy protocol ertapenem 1gm IV daily  Duration: 2 wk including receiving at hospital  End Date: 05/13/23  [facility] Care Per Protocol:  Home health RN for IV administration and teaching; PICC line care and labs.    Labs weekly while on IV antibiotics: _x_ CBC with differential _x_ BMP   _x_ Please pull PIC at completion of IV antibiotics   Fax weekly labs to [phone]  I have personally spent 50 minutes involved  in face-to-face and non-face-to-face activities for this patient on the day of the visit. Professional time spent includes the following activities: Preparing to see the patient (review of tests), Obtaining and/or reviewing separately obtained history (admission/discharge record), Performing a medically appropriate examination and/or evaluation , Ordering medications/tests/procedures, referring and communicating with other health care professionals, Documenting clinical information in the EMR, Independently interpreting results (not separately reported), Communicating results to the patient/family/caregiver, Counseling and educating the patient/family/caregiver and Care coordination (not separately reported).      [facility] Pager: [phone]  05/03/2023, 2:13 PM

## 2023-05-03 NOTE — Plan of Care (Signed)
  Problem: Coping: Goal: Level of anxiety will decrease Outcome: Progressing   Problem: Elimination: Goal: Will not experience complications related to bowel motility Outcome: Progressing   Problem: Pain Managment: Goal: General experience of comfort will improve and/or be controlled Outcome: Progressing   Problem: Safety: Goal: Ability to remain free from injury will improve Outcome: Progressing   Problem: Skin Integrity: Goal: Risk for impaired skin integrity will decrease Outcome: Progressing

## 2023-05-03 NOTE — Progress Notes (Addendum)
 Physical Therapy Treatment Patient Details Name: [name] MRN: [id number] DOB: May 26, 1934 Today's Date: 05/03/2023   History of Present Illness 89 yo female admitted with acute hypoxemic respiratory failure. Hx of COPD, sarcoidosis, Afib, CHF, L rev TSA, R RCR, CAD, sacral insufficiency fx, osteoporosis. Pt is LTC SNF resident.    PT Comments  Pt agreeable to working with therapy. Daughter present during session. Once EOB, pt able to sit with Supv level assist. She was able to perform a stand pivot with +2 assist. Positioned to comfort in recliner. Pt and daughter report pt to get washed up + linens changed at some point today-made RN/NT aware thru secure chat at daughter's request. Discussed d/c plan with daughter-plan is now for pt to d/c to rehab section at SNF 2* medical necessity. Patient could benefit from continued inpatient follow up therapy, <3 hours/day while in rehab section of SNF. Daughter reports plan for pt to transition back to LTC room once able.     If plan is discharge home, recommend the following: Two people to help with walking and/or transfers;Two people to help with bathing/dressing/bathroom;Assistance with cooking/housework;Assistance with feeding   Can travel by private vehicle        Equipment Recommendations  None recommended by PT    Recommendations for Other Services       Precautions / Restrictions Precautions Precautions: Fall Restrictions Weight Bearing Restrictions Per Provider Order: No     Mobility  Bed Mobility Overal bed mobility: Needs Assistance Bed Mobility: Supine to Sit     Supine to sit: Max assist, HOB elevated, Used rails     General bed mobility comments: Assist for trunk and bil LEs. Utilized bedpad for scooting, positioning at EOB. Increased time. Cues provided.    Transfers Overall transfer level: Needs assistance   Transfers: Sit to/from Stand, Bed to chair/wheelchair/BSC   Stand pivot transfers: Mod assist, +2  physical assistance, +2 safety/equipment         General transfer comment: Assist to rise, steady, pivot, control descent. Cues for safety, technique. Stand pivot, bed>recliner, with pt holding on to therapist's/tech's arms.    Ambulation/Gait               General Gait Details: nonambulatory   Stairs             Wheelchair Mobility     Tilt Bed    Modified Rankin (Stroke Patients Only)       Balance Overall balance assessment: Needs assistance Sitting-balance support: Feet supported, Bilateral upper extremity supported Sitting balance-Leahy Scale: Fair     Standing balance support: During functional activity Standing balance-Leahy Scale: Poor                              Communication Communication Communication: No apparent difficulties Factors Affecting Communication: Hearing impaired  Cognition Arousal: Alert Behavior During Therapy: WFL for tasks assessed/performed   PT - Cognitive impairments: No apparent impairments                         Following commands: Intact      Cueing Cueing Techniques: Verbal cues, Tactile cues, Visual cues  Exercises      General Comments        Pertinent Vitals/Pain Pain Assessment Pain Assessment: No/denies pain    Home Living  Prior Function            PT Goals (current goals can now be found in the care plan section) Progress towards PT goals: Progressing toward goals    Frequency    Min 1X/week      PT Plan      Co-evaluation              AM-PAC PT "6 Clicks" Mobility   Outcome Measure  Help needed turning from your back to your side while in a flat bed without using bedrails?: A Lot Help needed moving from lying on your back to sitting on the side of a flat bed without using bedrails?: A Lot Help needed moving to and from a bed to a chair (including a wheelchair)?: A Lot Help needed standing up from a chair using  your arms (e.g., wheelchair or bedside chair)?: A Lot Help needed to walk in hospital room?: Total Help needed climbing 3-5 steps with a railing? : Total 6 Click Score: 10    End of Session Equipment Utilized During Treatment: Gait belt Activity Tolerance: Patient tolerated treatment well Patient left: in chair;with call bell/phone within reach   PT Visit Diagnosis: Muscle weakness (generalized) (M62.81);Difficulty in walking, not elsewhere classified (R26.2)     Time: 7846-9629 PT Time Calculation (min) (ACUTE ONLY): 23 min  Charges:    $Therapeutic Activity: 23-37 mins PT General Charges $$ ACUTE PT VISIT: 1 Visit                        Faye Ramsay, PT Acute Rehabilitation  Office: [phone]

## 2023-05-03 NOTE — TOC Progression Note (Signed)
 Transition of Care [facility]) - Progression Note   Patient Details  Name: [name] MRN: [id number] Date of Birth: [birth year]  [facility]) CM/SW Contact  Ewing Schlein, LCSW Phone Number: 05/03/2023, 2:55 PM  Clinical Narrative: Patient is expected to discharge back to [facility] with IV antibiotics, so she will need to return under skilled nursing rather than long-term care. CSW provided antibiotic information to [place] [facility] for review.  Expected Discharge Plan: Skilled Nursing Facility Barriers to Discharge: Continued Medical Work up  Expected Discharge Plan and Services In-house Referral: Clinical Social Work Living arrangements for the past 2 months: Skilled Nursing Facility              DME Arranged: N/A DME Agency: NA  Social Determinants of Health (SDOH) Interventions SDOH Screenings   Food Insecurity: No Food Insecurity (04/29/2023)  Housing: Low Risk  (04/29/2023)  Transportation Needs: No Transportation Needs (04/29/2023)  Utilities: Not At Risk (04/29/2023)  Social Connections: Patient Unable To Answer (04/29/2023)  Tobacco Use: Low Risk  (04/29/2023)   Readmission Risk Interventions    05/02/2023   11:15 AM 04/07/2023    1:30 PM 03/31/2023   11:08 AM  Readmission Risk Prevention Plan  Transportation Screening Complete Complete Complete  PCP or Specialist Appt within 3-5 Days  Complete   HRI or Home Care Consult Complete Complete   Social Work Consult for Recovery Care Planning/Counseling Complete Complete   Palliative Care Screening Not Applicable Not Applicable   Medication Review Oceanographer) Complete Complete Complete  PCP or Specialist appointment within 3-5 days of discharge   Complete  HRI or Home Care Consult   Complete  SW Recovery Care/Counseling Consult   Complete  Palliative Care Screening   Complete  Skilled Nursing Facility   Complete

## 2023-05-03 NOTE — Progress Notes (Addendum)
 [facility]  PROGRESS NOTE  [name]:[id number] DOB: Apr 16, 1934 DOA: 04/29/2023 PCP: Eloisa Northern, MD   Brief HPI:    89 y.o. F with dCHF, CAD, pAF on Eliquis, Sarcoid quiescent, recent ESBL pyelonephritis with ureteral stone who presented with malaise, cough.   In the ER, diagnosed with pneumonia and CHF and admitted.  Blood cultures subsequently growing ESBL.     Assessment/Plan:    ESBL E. coli bacteremia -CT abdomen/pelvis obtained yesterday shows left-sided pulm nephritis. -Currently on IV Invanz -Ruled out  pneumonia, urine culture showed no growth -WBC is down to 11.8 -Has multiple Ortho hardware but no worsening of symptoms.  Echo without obvious vegetation. -This is a third episode of E. coli bacteremia , second in last 1 month. -Discussed with ID, she will require 14 days of Invanz. -Midline has been ordered  Pyelonephritis -Seen on CT abdomen/pelvis -Started on IV Invanz as above  Paroxysmal atrial fibrillation (HCC) Rate controlled - Continue Eliquis   SARCOIDOSIS, PULMONARY History of.  No active treatment at this time.    Recurrent UTI - Hold methenamine - See above   Hypothyroidism TSH normal last month - Continue levothyroxine   Dementia - Continue sertraline   Chronic diastolic CHF (congestive heart failure) (HCC) Acute CHF flare ruled out.  Appears euvolemic to dehydrated. - Hold home furosemide   COPD (chronic obstructive pulmonary disease) (HCC) Exacerbation ruled out.  Reports dyspnea but SpO2 normal.  No wheezing. - Continue Trelegy   Essential hypertension BP slightly up - Hold furosemide   CAD (coronary artery disease) - Continue Eliquis        Medications     apixaban  2.5 mg Oral BID   ascorbic acid  500 mg Oral Daily   ferrous sulfate  325 mg Oral QODAY   umeclidinium bromide  1 puff Inhalation Daily   And   fluticasone furoate-vilanterol  1 puff Inhalation Daily   guaiFENesin  1,200 mg Oral BID    levothyroxine  100 mcg Oral Daily   sertraline  50 mg Oral QHS   sodium chloride flush  3 mL Intravenous Q12H   thiamine  100 mg Oral Daily     Data Reviewed:   CBG:  No results for input(s): "GLUCAP" in the last 168 hours.  SpO2: 98 % O2 Flow Rate (L/min): 2 L/min    Vitals:   05/02/23 1953 05/03/23 0423 05/03/23 0500 05/03/23 0927  BP: (!) 153/58 (!) 165/64  (!) 158/61  Pulse: 65 65  60  Resp: 16 18  18   Temp: 98.6 F (37 C) 98.4 F (36.9 C)    TempSrc:      SpO2: 95% 97%  98%  Weight:   56 kg   Height:          Data Reviewed:  Basic Metabolic Panel: Recent Labs  Lab 04/29/23 1606 04/30/23 0423 05/01/23 0349 05/02/23 0358 05/03/23 0420  NA 133* 137 131* 132* 133*  K 3.9 3.0* 2.2* 3.5 3.7  CL 101 103 97* 102 102  CO2 20* 23 23 24 23   GLUCOSE 160* 144* 101* 103* 83  BUN 12 15 17 19 15   CREATININE 0.71 0.88 0.87 0.68 0.56  CALCIUM 8.2* 8.3* 7.7* 7.9* 7.9*  MG  --   --  1.6*  --   --     CBC: Recent Labs  Lab 04/29/23 1606 05/02/23 0358 05/03/23 0420  WBC 25.3* 16.9* 11.8*  NEUTROABS 23.1*  --   --  HGB 11.9* 11.7* 11.9*  HCT 36.9 34.7* 37.5  MCV 99.7 95.9 97.7  PLT 212 201 211    LFT Recent Labs  Lab 04/29/23 1606 05/03/23 0420  AST 23 30  ALT 14 20  ALKPHOS 92 80  BILITOT 0.8 0.7  PROT 6.0* 5.1*  ALBUMIN 3.1* 2.5*     Antibiotics: Anti-infectives (From admission, onward)    Start     Dose/Rate Route Frequency Ordered Stop   05/02/23 1800  ertapenem (INVANZ) 1 g in sodium chloride 0.9 % 100 mL IVPB        1 g 200 mL/hr over 30 Minutes Intravenous Every 24 hours 05/02/23 1024     04/30/23 1800  cefTRIAXone (ROCEPHIN) 1 g in sodium chloride 0.9 % 100 mL IVPB  Status:  Discontinued        1 g 200 mL/hr over 30 Minutes Intravenous Every 24 hours 04/29/23 2107 04/30/23 1653   04/30/23 1800  meropenem (MERREM) 1 g in sodium chloride 0.9 % 100 mL IVPB  Status:  Discontinued        1 g 200 mL/hr over 30 Minutes Intravenous Every 12  hours 04/30/23 1659 05/02/23 1024   04/30/23 0900  doxycycline (VIBRAMYCIN) 100 mg in sodium chloride 0.9 % 250 mL IVPB  Status:  Discontinued        100 mg 125 mL/hr over 120 Minutes Intravenous Every 12 hours 04/29/23 2107 04/30/23 1653   04/29/23 1845  cefTRIAXone (ROCEPHIN) 2 g in sodium chloride 0.9 % 100 mL IVPB        2 g 200 mL/hr over 30 Minutes Intravenous STAT 04/29/23 1834 04/29/23 1921   04/29/23 1845  azithromycin (ZITHROMAX) 500 mg in sodium chloride 0.9 % 250 mL IVPB        500 mg 250 mL/hr over 60 Minutes Intravenous  Once 04/29/23 1834 04/29/23 2039        DVT prophylaxis: Apixaban  Code Status: DNR  Family Communication: Discussed with patient's daughter at bedside   CONSULTS infectious disease   Subjective   Denies dysuria.  Denies abdominal pain.   Objective    Physical Examination:   General-appears in no acute distress Heart-S1-S2, regular, no murmur auscultated Lungs-clear to auscultation bilaterally, no wheezing or crackles auscultated Abdomen-soft, nontender, no organomegaly Extremities-no edema in the lower extremities Neuro-alert, oriented x3, no focal deficit noted  Status is: Inpatient:             [name]   [facility] If 7PM-7AM, please contact night-coverage at [email or web address], Office  [phone]   05/03/2023, 9:42 AM  LOS: 4 days

## 2023-05-04 DIAGNOSIS — N10 Acute tubulo-interstitial nephritis: Secondary | ICD-10-CM | POA: Diagnosis not present

## 2023-05-04 DIAGNOSIS — R7881 Bacteremia: Secondary | ICD-10-CM | POA: Diagnosis not present

## 2023-05-04 DIAGNOSIS — B9620 Unspecified Escherichia coli [E. coli] as the cause of diseases classified elsewhere: Secondary | ICD-10-CM | POA: Diagnosis not present

## 2023-05-04 DIAGNOSIS — I480 Paroxysmal atrial fibrillation: Secondary | ICD-10-CM | POA: Diagnosis not present

## 2023-05-04 LAB — BASIC METABOLIC PANEL
Anion gap: 6 (ref 5–15)
BUN: 12 mg/dL (ref 8–23)
CO2: 23 mmol/L (ref 22–32)
Calcium: 8 mg/dL — ABNORMAL LOW (ref 8.9–10.3)
Chloride: 104 mmol/L (ref 98–111)
Creatinine, Ser: 0.65 mg/dL (ref 0.44–1.00)
GFR, Estimated: >60 mL/min (ref >60)
Glucose, Bld: 94 mg/dL (ref 70–99)
Potassium: 4 mmol/L (ref 3.5–5.1)
Sodium: 133 mmol/L — ABNORMAL LOW (ref 135–145)

## 2023-05-04 LAB — CBC
HCT: 37.6 % (ref 36.0–46.0)
Hemoglobin: 12.3 g/dL (ref 12.0–15.0)
MCH: 31.5 pg (ref 26.0–34.0)
MCHC: 32.7 g/dL (ref 30.0–36.0)
MCV: 96.2 fL (ref 80.0–100.0)
Platelets: 242 10*3/uL (ref 150–400)
RBC: 3.91 MIL/uL (ref 3.87–5.11)
RDW: 14.8 % (ref 11.5–15.5)
WBC: 11.7 10*3/uL — ABNORMAL HIGH (ref 4.0–10.5)
nRBC: 0 % (ref 0.0–0.2)

## 2023-05-04 MED ORDER — ERTAPENEM IV (FOR PTA / DISCHARGE USE ONLY): 1.0000 g | INTRAVENOUS | 0 refills | Status: DC

## 2023-05-04 MED ORDER — OXYCODONE HCL 5 MG PO TABS: 5.0000 mg | ORAL_TABLET | Freq: Four times a day (QID) | ORAL | 0 refills | Status: DC | PRN

## 2023-05-04 NOTE — Discharge Summary (Signed)
 Physician Discharge Summary   Patient: [name] MRN: [id number] DOB: 1934-12-30  Admit date:     04/29/2023  Discharge date: 05/04/23  Discharge Physician: [name]   PCP: Eloisa Northern, MD   Recommendations at discharge:   Patient will be discharged on ertapenem 1 g IV daily for total 14 days.  End date May 13, 2023 Midline can be removed after 14 days of antibiotics.  Discharge Diagnoses: Principal Problem:   ESBL E. coli bacteremia Active Problems:   Paroxysmal atrial fibrillation (HCC)   SARCOIDOSIS, PULMONARY   CAD (coronary artery disease)   Essential hypertension   COPD (chronic obstructive pulmonary disease) (HCC)   Chronic diastolic CHF (congestive heart failure) (HCC)   Dementia   Hypothyroidism   Recurrent UTI   Hypomagnesemia  Resolved Problems:   * No resolved hospital problems. [name] Course: 89 y.o. F with dCHF, CAD, pAF on Eliquis, Sarcoid quiescent, recent ESBL pyelonephritis with ureteral stone who presented with malaise, cough.  In the ER, diagnosed with pneumonia and CHF and admitted.  Blood cultures subsequently growing ESBL.  Assessment and Plan:    ESBL E. coli bacteremia/pyelonephritis -CT abdomen/pelvis obtained yesterday shows left-sided pyelonephritis -Currently on IV Invanz -Ruled out  pneumonia, urine culture showed no growth -WBC is down to 11.8 -Has multiple Ortho hardware but no worsening of symptoms.  Echo without obvious vegetation. -This is a third episode of E. coli bacteremia , second in last 1 month. -Discussed with ID, she will require 14 days of Invanz. -Midline has been placed   Pyelonephritis -Seen on CT abdomen/pelvis -Started on IV Invanz as above   Paroxysmal atrial fibrillation (HCC) Rate controlled - Continue Eliquis   SARCOIDOSIS, PULMONARY History of.  No active treatment at this time.    Recurrent UTI - Hold methenamine - See above   Hypothyroidism TSH normal last month - Continue  levothyroxine   Dementia - Continue sertraline   Chronic diastolic CHF (congestive heart failure) (HCC) Acute CHF flare ruled out.      COPD (chronic obstructive pulmonary disease) (HCC) Exacerbation ruled out.  Reports dyspnea but SpO2 normal.  No wheezing. - Continue Trelegy   Essential hypertension Continue home medications    CAD (coronary artery disease) - Continue Eliquis         Consultants: Infectious disease Procedures performed: Disposition: Skilled nursing facility Diet recommendation:  Discharge Diet Orders (From admission, onward)     Start     Ordered   05/04/23 0000  Diet - low sodium heart healthy        05/04/23 1107           Regular diet DISCHARGE MEDICATION: Allergies as of 05/04/2023       Reactions   Levaquin [levofloxacin] Other (See Comments)   Joint pain.. Per doctor, told to not to take again and "allergic," per [facility]   Oxycodone Nausea And Vomiting, Other (See Comments)   Can take oxycodone with Zofran (otherwise, GI Intolerance) "Allergic," per [name]   Oxycodone-acetaminophen Other (See Comments)   GI Intolerance and "talking out of my head" and "allergic," per [facility]   Morphine Nausea Only, Other (See Comments)   GI intolerance and "allergic," per [facility]        Medication List     STOP taking these medications    traMADol 50 MG tablet Commonly known as: ULTRAM       TAKE these medications    apixaban 2.5 MG Tabs tablet Commonly known as:  ELIQUIS Take 2.5 mg by mouth 2 (two) times daily.   ascorbic acid 500 MG tablet Commonly known as: VITAMIN C Take 500 mg by mouth daily.   Biofreeze Roll-On 4 % Gel Generic drug: Menthol (Topical Analgesic) Apply 1 application  topically See admin instructions. Apply to both knees and the right should every 6 hours as needed for arthritic pain   Breztri Aerosphere 160-9-4.8 MCG/ACT Aero Generic drug: budeson-glycopyrrolate-formoterol Inhale 2 puffs into the lungs in the morning and  at bedtime.   ertapenem IVPB Commonly known as: INVANZ Inject 1 g into the vein daily. Indication:  ESBL ecoli bacteremia First Dose: Yes Last Day of Therapy:  05/13/23 Labs - Once weekly:  CBC/D and BMP, Labs - Once weekly: ESR and CRP Method of administration: Mini-Bag Plus / Gravity Method of administration may be changed at the discretion of home infusion pharmacist based upon assessment of the patient and/or caregiver's ability to self-administer the medication ordered.   ferrous sulfate 325 (65 FE) MG tablet Take 325 mg by mouth See admin instructions. Take 325 mg by mouth EVERY OTHER morning- with orange juice   furosemide 20 MG tablet Commonly known as: LASIX Take 1 tablet (20 mg total) by mouth daily.   guaiFENesin 600 MG 12 hr tablet Commonly known as: MUCINEX Take 2 tablets (1,200 mg total) by mouth 2 (two) times daily.   ipratropium-albuterol 0.5-2.5 (3) MG/3ML Soln Commonly known as: DUONEB Take 3 mLs by nebulization every 6 (six) hours as needed (wheezing; shortness of breath).   levothyroxine 50 MCG tablet Commonly known as: SYNTHROID Take 100 mcg by mouth daily before breakfast.   melatonin 5 MG Tabs Take 5 mg by mouth at bedtime.   methenamine 1 g tablet Commonly known as: HIPREX Take 1 g by mouth daily.   metoCLOPramide 5 MG tablet Commonly known as: Reglan Take 1 tablet (5 mg total) by mouth every 8 (eight) hours as needed for nausea or vomiting.   omeprazole 40 MG capsule Commonly known as: PRILOSEC Take 40 mg by mouth in the morning and at bedtime.   ondansetron 4 MG tablet Commonly known as: ZOFRAN Take 4 mg by mouth every 6 (six) hours as needed for nausea or vomiting.   oxybutynin 5 MG tablet Commonly known as: DITROPAN Take 5 mg by mouth daily.   oxyCODONE 5 MG immediate release tablet Commonly known as: Oxy IR/ROXICODONE Take 1 tablet (5 mg total) by mouth every 6 (six) hours as needed for severe pain (pain score 7-10).   polyethylene  glycol 17 g packet Commonly known as: MIRALAX / GLYCOLAX Take 17 g by mouth daily as needed for mild constipation.   potassium chloride SA 20 MEQ tablet Commonly known as: KLOR-CON M Take 20 mEq by mouth daily.   saccharomyces boulardii 250 MG capsule Commonly known as: FLORASTOR Take 1 capsule (250 mg total) by mouth 2 (two) times daily.   senna-docusate 8.6-50 MG tablet Commonly known as: Senokot-S Take 1 tablet by mouth at bedtime as needed for mild constipation.   sertraline 50 MG tablet Commonly known as: ZOLOFT Take 1 tablet (50 mg total) by mouth at bedtime.   sodium chloride 0.65 % Soln nasal spray Commonly known as: OCEAN Place 1 spray into both nostrils 2 (two) times daily.   Systane Complete 0.6 % Soln Generic drug: Propylene Glycol Place 1 drop into both eyes in the morning and at bedtime.   thiamine 100 MG tablet Commonly known as: Vitamin B-1 Take 1  tablet (100 mg total) by mouth daily.   TUMS ULTRA 1000 PO Take 1,000 mg by mouth 2 (two) times daily.               Home Infusion Instuctions  (From admission, onward)           Start     Ordered   05/04/23 0000  Home infusion instructions       Question:  Instructions  Answer:  Flushing of vascular access device: 0.9% NaCl pre/post medication administration and prn patency; Heparin 100 u/ml, 5ml for implanted ports and Heparin 10u/ml, 5ml for all other central venous catheters.   05/04/23 1107            Discharge Exam: Filed Weights   05/02/23 0452 05/03/23 0500 05/04/23 0502  Weight: 55.1 kg 56 kg 57.3 kg   General-appears in no acute distress Heart-S1-S2, regular, no murmur auscultated Lungs-clear to auscultation bilaterally, no wheezing or crackles auscultated Abdomen-soft, nontender, no organomegaly Extremities-no edema in the lower extremities Neuro-alert, oriented x3, no focal deficit noted  Condition at discharge: good  The results of significant diagnostics from this  hospitalization (including imaging, microbiology, ancillary and laboratory) are listed below for reference.   Imaging Studies: [place] EKG SITE RITE Result Date: 05/03/2023 If Site Rite image not attached, placement could not be confirmed due to current cardiac rhythm.  CT ABDOMEN PELVIS W CONTRAST Result Date: 05/02/2023 CLINICAL DATA:  Gram-negative bacteremia EXAM: CT ABDOMEN AND PELVIS WITH CONTRAST TECHNIQUE: Multidetector CT imaging of the abdomen and pelvis was performed using the standard protocol following bolus administration of intravenous contrast. RADIATION DOSE REDUCTION: This exam was performed according to the departmental dose-optimization program which includes automated exposure control, adjustment of the mA and/or kV according to patient size and/or use of iterative reconstruction technique. CONTRAST:  OMNIPAQUE IOHEXOL 300 MG/ML  SOLN COMPARISON:  03/31/2023 FINDINGS: Lower chest: Bibasilar scarring. Small left and trace right pleural effusion. Moderate hiatal hernia. No acute abnormality. Hepatobiliary: Stable scattered hepatic cysts. Moderate hepatic steatosis. No enhancing intrahepatic mass. No intra or extrahepatic biliary ductal dilation. Gallbladder unremarkable. Pancreas: Unremarkable Spleen: Unremarkable Adrenals/Urinary Tract: The adrenal glands are unremarkable. The kidneys are normal in size and position. There is heterogeneous cortical enhancement of the left kidney with areas of ill-defined hypoenhancement in keeping with a inflammatory process, most commonly reflecting pyelonephritis, particularly given the patient's history. Several punctate 1-2 mm nonobstructing calculi are noted within the lower pole left kidney. No hydronephrosis. No additional renal or ureteral calculi. Simple cortical cyst within the right kidney noted for which no follow-up imaging is recommended. Normal cortical enhancement of the right kidney. No perinephric fluid collections. Bladder is obscured  by streak artifact. Stomach/Bowel: Moderate sigmoid diverticulosis. Circumferential rectal wall thickening and presacral edema may reflect changes of an underlying proctitis. Stomach, small bowel, and large bowel are otherwise unremarkable. Appendix absent. No evidence of obstruction or focal inflammation. No free intraperitoneal gas. Mild ascites. Vascular/Lymphatic: Aortic atherosclerosis. No enlarged abdominal or pelvic lymph nodes. Reproductive: Obscured by streak artifact Other: Small fat containing umbilical and left inguinal hernias. Musculoskeletal: Right total hip arthroplasty. Left hip ORIF. Healed fractures of the pubic symphysis and inferior pubic rami bilaterally. Osseous structures are diffusely osteopenic. Superior endplate fractures L4 and L5 again noted, chronic in nature. No acute bone abnormality. IMPRESSION: 1. Heterogeneous cortical enhancement of the left kidney with areas of ill-defined hypoenhancement in keeping with a inflammatory process, most commonly reflecting pyelonephritis, particularly given the patient's history. No  hydronephrosis. No perinephric fluid collections. 2. Minimal nonobstructing left nephrolithiasis. 3. Moderate hepatic steatosis. 4. Moderate sigmoid diverticulosis. 5. Circumferential rectal wall thickening and presacral edema may reflect changes of an underlying proctitis. 6. Small left and trace right pleural effusions. Aortic Atherosclerosis (ICD10-I70.0). Electronically Signed   By: Helyn Numbers M.D.   On: 05/02/2023 22:31   ECHOCARDIOGRAM COMPLETE Result Date: 04/30/2023    ECHOCARDIOGRAM REPORT   Patient Name:   PATRIC VANPELT Date of Exam: 04/30/2023 Medical Rec #:  [id number]        Height:       63.0 in Accession #:    [id number]       Weight:       119.0 lb Date of Birth:  Nov 26, 1934        BSA:          1.551 m Patient Age:    89 years         BP:           109/78 mmHg Patient Gender: F                HR:           78 bpm. Exam Location:  Inpatient  Procedure: 2D Echo, Cardiac Doppler and Color Doppler (Both Spectral and Color            Flow Doppler were utilized during procedure). Indications:    CHF  History:        Patient has prior history of Echocardiogram examinations, most                 recent 05/25/2022. CHF, CAD, COPD; Risk Factors:Hypertension.  Sonographer:    Amy Chionchio Referring Phys: [id number] MOHAMMAD L GARBA IMPRESSIONS  1. Left ventricular ejection fraction, by estimation, is 55 to 60%. The left ventricle has normal function. The left ventricle has no regional wall motion abnormalities. Left ventricular diastolic parameters are indeterminate.  2. Right ventricular systolic function is normal. The right ventricular size is normal. There is mildly elevated pulmonary artery systolic pressure.  3. The mitral valve is normal in structure. Mild mitral valve regurgitation. No evidence of mitral stenosis.  4. The tricuspid valve is abnormal.  5. The aortic valve is tricuspid. There is mild calcification of the aortic valve. There is mild thickening of the aortic valve. Aortic valve regurgitation is not visualized. No aortic stenosis is present.  6. The inferior vena cava is dilated in size with >50% respiratory variability, suggesting right atrial pressure of 8 mmHg. FINDINGS  Left Ventricle: Left ventricular ejection fraction, by estimation, is 55 to 60%. The left ventricle has normal function. The left ventricle has no regional wall motion abnormalities. The left ventricular internal cavity size was normal in size. There is  no left ventricular hypertrophy. Left ventricular diastolic parameters are indeterminate. Right Ventricle: The right ventricular size is normal. Right vetricular wall thickness was not well visualized. Right ventricular systolic function is normal. There is mildly elevated pulmonary artery systolic pressure. The tricuspid regurgitant velocity  is 2.92 m/s, and with an assumed right atrial pressure of 8 mmHg, the estimated right  ventricular systolic pressure is 42.1 mmHg. Left Atrium: Left atrial size was normal in size. Right Atrium: Right atrial size was normal in size. Pericardium: There is no evidence of pericardial effusion. Mitral Valve: The mitral valve is normal in structure. There is mild thickening of the mitral valve leaflet(s). There is mild calcification of the mitral valve leaflet(s). Mild  mitral annular calcification. Mild mitral valve regurgitation. No evidence of  mitral valve stenosis. MV peak gradient, 3.2 mmHg. The mean mitral valve gradient is 2.0 mmHg. Tricuspid Valve: The tricuspid valve is abnormal. Tricuspid valve regurgitation is mild . No evidence of tricuspid stenosis. Aortic Valve: The aortic valve is tricuspid. There is mild calcification of the aortic valve. There is mild thickening of the aortic valve. There is mild aortic valve annular calcification. Aortic valve regurgitation is not visualized. No aortic stenosis  is present. Aortic valve mean gradient measures 5.0 mmHg. Aortic valve peak gradient measures 8.4 mmHg. Aortic valve area, by VTI measures 1.87 cm. Pulmonic Valve: The pulmonic valve was not well visualized. Pulmonic valve regurgitation is not visualized. No evidence of pulmonic stenosis. Aorta: The aortic root and ascending aorta are structurally normal, with no evidence of dilitation. Venous: The inferior vena cava is dilated in size with greater than 50% respiratory variability, suggesting right atrial pressure of 8 mmHg. IAS/Shunts: No atrial level shunt detected by color flow Doppler.  LEFT VENTRICLE PLAX 2D LVIDd:         4.20 cm     Diastology LVIDs:         2.70 cm     LV e' medial:    6.09 cm/s LV PW:         0.90 cm     LV E/e' medial:  12.9 LV IVS:        0.90 cm     LV e' lateral:   7.83 cm/s LVOT diam:     1.90 cm     LV E/e' lateral: 10.1 LV SV:         50 LV SV Index:   32 LVOT Area:     2.84 cm  LV Volumes (MOD) LV vol d, MOD A2C: 55.7 ml LV vol d, MOD A4C: 61.6 ml LV vol s, MOD  A2C: 19.9 ml LV vol s, MOD A4C: 17.9 ml LV SV MOD A2C:     35.8 ml LV SV MOD A4C:     61.6 ml LV SV MOD BP:      38.8 ml RIGHT VENTRICLE             IVC RV Basal diam:  4.10 cm     IVC diam: 2.40 cm RV Mid diam:    3.30 cm RV S prime:     13.80 cm/s TAPSE (M-mode): 2.1 cm LEFT ATRIUM             Index        RIGHT ATRIUM           Index LA Vol (A2C):   46.4 ml 29.92 ml/m  RA Area:     14.50 cm LA Vol (A4C):   50.1 ml 32.30 ml/m  RA Volume:   36.50 ml  23.53 ml/m LA Biplane Vol: 49.2 ml 31.72 ml/m  AORTIC VALVE                    PULMONIC VALVE AV Area (Vmax):    2.17 cm     PV Vmax:       0.95 m/s AV Area (Vmean):   1.85 cm     PV Peak grad:  3.6 mmHg AV Area (VTI):     1.87 cm AV Vmax:           145.00 cm/s AV Vmean:          99.800 cm/s AV VTI:  0.269 m AV Peak Grad:      8.4 mmHg AV Mean Grad:      5.0 mmHg LVOT Vmax:         111.00 cm/s LVOT Vmean:        65.100 cm/s LVOT VTI:          0.177 m LVOT/AV VTI ratio: 0.66  AORTA Ao Root diam: 2.60 cm Ao Asc diam:  2.50 cm MITRAL VALVE               TRICUSPID VALVE MV Area (PHT): 3.17 cm    TR Peak grad:   34.1 mmHg MV Area VTI:   2.05 cm    TR Vmax:        292.00 cm/s MV Peak grad:  3.2 mmHg MV Mean grad:  2.0 mmHg    SHUNTS MV Vmax:       0.90 m/s    Systemic VTI:  0.18 m MV Vmean:      59.1 cm/s   Systemic Diam: 1.90 cm MV Decel Time: 239 msec MV E velocity: 78.80 cm/s MV A velocity: 81.50 cm/s MV E/A ratio:  0.97 Dina Rich MD Electronically signed by Dina Rich MD Signature Date/Time: 04/30/2023/3:22:29 PM    Final    DG Chest 2 View Result Date: 04/29/2023 CLINICAL DATA:  Shortness of breath. EXAM: CHEST - 2 VIEW COMPARISON:  April 03, 2023. FINDINGS: The heart size and mediastinal contours are within normal limits. Status post left shoulder arthroplasty. Mild central pulmonary vascular congestion is noted with minimal possible pulmonary edema. Minimal pleural effusions are noted. IMPRESSION: Mild central pulmonary vascular  congestion is noted with minimal possible bilateral pulmonary edema. Electronically Signed   By: Lupita Raider M.D.   On: 04/29/2023 16:48    Microbiology: Results for orders placed or performed during the hospital encounter of 04/29/23  Resp panel by RT-PCR (RSV, Flu A&B, Covid) Anterior Nasal Swab     Status: None   Collection Time: 04/29/23  4:11 PM   Specimen: Anterior Nasal Swab  Result Value Ref Range Status   SARS Coronavirus 2 by RT PCR NEGATIVE NEGATIVE Final    Comment: (NOTE) SARS-CoV-2 target nucleic acids are NOT DETECTED.  The SARS-CoV-2 RNA is generally detectable in upper respiratory specimens during the acute phase of infection. The lowest concentration of SARS-CoV-2 viral copies this assay can detect is 138 copies/mL. A negative result does not preclude SARS-Cov-2 infection and should not be used as the sole basis for treatment or other patient management decisions. A negative result may occur with  improper specimen collection/handling, submission of specimen other than nasopharyngeal swab, presence of viral mutation(s) within the areas targeted by this assay, and inadequate number of viral copies(<138 copies/mL). A negative result must be combined with clinical observations, patient history, and epidemiological information. The expected result is Negative.  Fact Sheet for Patients:  BloggerCourse.com  Fact Sheet for Healthcare Providers:  SeriousBroker.it  This test is no t yet approved or cleared by the [place] FDA and  has been authorized for detection and/or diagnosis of SARS-CoV-2 by FDA under an Emergency Use Authorization (EUA). This EUA will remain  in effect (meaning this test can be used) for the duration of the COVID-19 declaration under Section 564(b)(1) of the Act, 21 U.S.C.section 360bbb-3(b)(1), unless the authorization is terminated  or revoked sooner.       Influenza A by PCR  NEGATIVE NEGATIVE Final   Influenza B by PCR NEGATIVE NEGATIVE Final  Comment: (NOTE) The Xpert Xpress SARS-CoV-2/FLU/RSV plus assay is intended as an aid in the diagnosis of influenza from Nasopharyngeal swab specimens and should not be used as a sole basis for treatment. Nasal washings and aspirates are unacceptable for Xpert Xpress SARS-CoV-2/FLU/RSV testing.  Fact Sheet for Patients: BloggerCourse.com  Fact Sheet for Healthcare Providers: SeriousBroker.it  This test is not yet approved or cleared by the [place] FDA and has been authorized for detection and/or diagnosis of SARS-CoV-2 by FDA under an Emergency Use Authorization (EUA). This EUA will remain in effect (meaning this test can be used) for the duration of the COVID-19 declaration under Section 564(b)(1) of the Act, 21 U.S.C. section 360bbb-3(b)(1), unless the authorization is terminated or revoked.     Resp Syncytial Virus by PCR NEGATIVE NEGATIVE Final    Comment: (NOTE) Fact Sheet for Patients: BloggerCourse.com  Fact Sheet for Healthcare Providers: SeriousBroker.it  This test is not yet approved or cleared by the [place] FDA and has been authorized for detection and/or diagnosis of SARS-CoV-2 by FDA under an Emergency Use Authorization (EUA). This EUA will remain in effect (meaning this test can be used) for the duration of the COVID-19 declaration under Section 564(b)(1) of the Act, 21 U.S.C. section 360bbb-3(b)(1), unless the authorization is terminated or revoked.  Performed at [facility], [address] [place], [place] [address]   Blood culture (routine x 2)     Status: Abnormal   Collection Time: 04/29/23  6:34 PM   Specimen: BLOOD  Result Value Ref Range Status   Specimen Description   Final    BLOOD LEFT ANTECUBITAL Performed at [facility],  [address] [place], [place] [address]    Special Requests   Final    BOTTLES DRAWN AEROBIC AND ANAEROBIC Blood Culture results may not be optimal due to an inadequate volume of blood received in culture bottles Performed at [facility], [address] [place], [place] [address]    Culture  Setup Time   Final    GRAM NEGATIVE RODS IN BOTH AEROBIC AND ANAEROBIC BOTTLES CRITICAL RESULT CALLED TO, READ BACK BY AND VERIFIED WITH: PHARMD SYDNEY DAVIS [id number] AT 1630 BY EC Performed at [facility], [address], [place], [place] [address]    Culture (A)  Final    ESCHERICHIA COLI Confirmed Extended Spectrum Beta-Lactamase Producer (ESBL).  In bloodstream infections from ESBL organisms, carbapenems are preferred over piperacillin/tazobactam. They are shown to have a lower risk of mortality.    Report Status 05/02/2023 FINAL  Final   Organism ID, Bacteria ESCHERICHIA COLI  Final      Susceptibility   Escherichia coli - MIC*    AMPICILLIN >=32 RESISTANT Resistant     CEFEPIME >=32 RESISTANT Resistant     CEFTAZIDIME RESISTANT Resistant     CEFTRIAXONE >=64 RESISTANT Resistant     CIPROFLOXACIN >=4 RESISTANT Resistant     GENTAMICIN >=16 RESISTANT Resistant     IMIPENEM <=0.25 SENSITIVE Sensitive     TRIMETH/SULFA <=20 SENSITIVE Sensitive     AMPICILLIN/SULBACTAM >=32 RESISTANT Resistant     PIP/TAZO 8 SENSITIVE Sensitive ug/mL    * ESCHERICHIA COLI  Blood Culture ID Panel (Reflexed)     Status: Abnormal   Collection Time: 04/29/23  6:34 PM  Result Value Ref Range Status   Enterococcus faecalis NOT DETECTED NOT DETECTED Final   Enterococcus Faecium NOT DETECTED NOT DETECTED Final   Listeria monocytogenes NOT DETECTED NOT  DETECTED Final   Staphylococcus species NOT DETECTED NOT DETECTED Final   Staphylococcus aureus (BCID) NOT DETECTED NOT DETECTED Final   Staphylococcus epidermidis NOT DETECTED NOT DETECTED Final   Staphylococcus lugdunensis NOT  DETECTED NOT DETECTED Final   Streptococcus species NOT DETECTED NOT DETECTED Final   Streptococcus agalactiae NOT DETECTED NOT DETECTED Final   Streptococcus pneumoniae NOT DETECTED NOT DETECTED Final   Streptococcus pyogenes NOT DETECTED NOT DETECTED Final   A.calcoaceticus-baumannii NOT DETECTED NOT DETECTED Final   Bacteroides fragilis NOT DETECTED NOT DETECTED Final   Enterobacterales DETECTED (A) NOT DETECTED Final    Comment: Enterobacterales represent a large order of gram negative bacteria, not a single organism. CRITICAL RESULT CALLED TO, READ BACK BY AND VERIFIED WITH: PHARMD SYDNEY DAVIS [id number] AT 1630 BY EC    Enterobacter cloacae complex NOT DETECTED NOT DETECTED Final   Escherichia coli DETECTED (A) NOT DETECTED Final    Comment: CRITICAL RESULT CALLED TO, READ BACK BY AND VERIFIED WITH: PHARMD SYDNEY DAVIS [id number] AT 1630 BY EC    Klebsiella aerogenes NOT DETECTED NOT DETECTED Final   Klebsiella oxytoca NOT DETECTED NOT DETECTED Final   Klebsiella pneumoniae NOT DETECTED NOT DETECTED Final   Proteus species NOT DETECTED NOT DETECTED Final   Salmonella species NOT DETECTED NOT DETECTED Final   Serratia marcescens NOT DETECTED NOT DETECTED Final   Haemophilus influenzae NOT DETECTED NOT DETECTED Final   Neisseria meningitidis NOT DETECTED NOT DETECTED Final   Pseudomonas aeruginosa NOT DETECTED NOT DETECTED Final   Stenotrophomonas maltophilia NOT DETECTED NOT DETECTED Final   Candida albicans NOT DETECTED NOT DETECTED Final   Candida auris NOT DETECTED NOT DETECTED Final   Candida glabrata NOT DETECTED NOT DETECTED Final   Candida krusei NOT DETECTED NOT DETECTED Final   Candida parapsilosis NOT DETECTED NOT DETECTED Final   Candida tropicalis NOT DETECTED NOT DETECTED Final   Cryptococcus neoformans/gattii NOT DETECTED NOT DETECTED Final   CTX-M ESBL DETECTED (A) NOT DETECTED Final    Comment: CRITICAL RESULT CALLED TO, READ BACK BY AND VERIFIED WITH: PHARMD  SYDNEY DAVIS [id number] AT 1630 BY EC (NOTE) Extended spectrum beta-lactamase detected. Recommend a carbapenem as initial therapy.      Carbapenem resistance IMP NOT DETECTED NOT DETECTED Final   Carbapenem resistance KPC NOT DETECTED NOT DETECTED Final   Carbapenem resistance NDM NOT DETECTED NOT DETECTED Final   Carbapenem resist OXA 48 LIKE NOT DETECTED NOT DETECTED Final   Carbapenem resistance VIM NOT DETECTED NOT DETECTED Final    Comment: Performed at [facility], [address], [place], [place] [address]  Blood culture (routine x 2)     Status: Abnormal   Collection Time: 04/29/23  8:42 PM   Specimen: BLOOD  Result Value Ref Range Status   Specimen Description   Final    BLOOD BLOOD RIGHT ARM AEROBIC BOTTLE ONLY Performed at [facility], [address], [place], [place] [address]    Special Requests   Final    Blood Culture results may not be optimal due to an inadequate volume of blood received in culture bottles Performed at [facility], [address], [place], [place] [address]    Culture  Setup Time   Final    GRAM NEGATIVE RODS AEROBIC BOTTLE ONLY CRITICAL VALUE NOTED.  VALUE IS CONSISTENT WITH PREVIOUSLY REPORTED AND CALLED VALUE.    Culture (A)  Final    ESCHERICHIA COLI SUSCEPTIBILITIES PERFORMED ON PREVIOUS CULTURE WITHIN THE  LAST 5 DAYS. Performed at [facility], [address] [place], [place] [address]    Report Status 05/02/2023 FINAL  Final  Urine Culture     Status: None   Collection Time: 04/30/23  6:53 PM   Specimen: Urine, Random  Result Value Ref Range Status   Specimen Description   Final    URINE, RANDOM Performed at [facility], [address], [place], [place] [address]    Special Requests   Final    NONE Reflexed from [id number] Performed at [facility], [address], [place], [place] [address]    Culture   Final    NO GROWTH Performed at [facility], [address], [place], [place] [address]    Report Status 05/01/2023 FINAL  Final    Labs: CBC: Recent Labs  Lab 04/29/23 1606 05/02/23 0358 05/03/23 0420 05/04/23 0327  WBC 25.3* 16.9* 11.8* 11.7*  NEUTROABS 23.1*  --   --   --   HGB 11.9* 11.7* 11.9* 12.3  HCT 36.9 34.7* 37.5 37.6  MCV 99.7 95.9 97.7 96.2  PLT 212 201 211 242   Basic Metabolic Panel: Recent Labs  Lab 04/30/23 0423 05/01/23 0349 05/02/23 0358 05/03/23 0420 05/04/23 0327  NA 137 131* 132* 133* 133*  K 3.0* 2.2* 3.5 3.7 4.0  CL 103 97* 102 102 104  CO2 23 23 24 23 23   GLUCOSE 144* 101* 103* 83 94  BUN 15 17 19 15 12   CREATININE 0.88 0.87 0.68 0.56 0.65  CALCIUM 8.3* 7.7* 7.9* 7.9* 8.0*  MG  --  1.6*  --   --   --    Liver Function Tests: Recent Labs  Lab 04/29/23 1606 05/03/23 0420  AST 23 30  ALT 14 20  ALKPHOS 92 80  BILITOT 0.8 0.7  PROT 6.0* 5.1*  ALBUMIN 3.1* 2.5*   CBG: No results for input(s): "GLUCAP" in the last 168 hours.  Discharge time spent: greater than 30 minutes.  Signed: [name], MD [facility] 05/04/2023

## 2023-05-04 NOTE — Care Management Important Message (Signed)
 Important Message  Patient Details IM Letter given. Name: [name] MRN: [id number] Date of Birth: 09/24/1934   Important Message Given:  Yes - Medicare IM     Caren Macadam 05/04/2023, 12:38 PM

## 2023-05-04 NOTE — Consult Note (Signed)
 [facility] Liaison Consult Note   05/04/2023  [name] 1935-01-20 [id number]  Insurance: Medicare ACO REACH  Primary Care Provider: Eloisa Northern, MD  [facility] Liaison screened the patient remotely at [facility].    The patient was screened for 30 day readmission hospitalization with noted high risk score for unplanned readmission risk 2 hospital admissions in 6 months.  The patient was assessed for potential [facility] Coordination service needs for post hospital transition for care coordination. Review of patient's electronic medical record reveals patient is admitted with ESBL E.coli and recommended for IV antibiotic therapy at a skilled nursing facility level of care.  Patient is from [facility], LTC.   Plan: [facility] Liaison will continue to follow progress and disposition to asess for post hospital community care coordination/management needs.  Referral request for community care coordination: None, currently. Patient needs are to be met at a SNF level of care and then return to LTC anticipated when medically ready from SNF.   [facility] Community Care, Population Health does not replace or interfere with any arrangements made by the Inpatient Transition of Care team.   For questions contact:   Charlesetta Shanks, RN, BSN, CCM [place]  [facility] Liaison Direct Dial: [phone] or secure chat Email: [email or web address]

## 2023-05-04 NOTE — Plan of Care (Addendum)
 RN attempted to call for report x3 to number given by case management [phone] ) for pt going to room 605.  RN left voicemail to have facility call back.  -----  Facility called back; RN gave report to [facility]

## 2023-05-04 NOTE — Plan of Care (Addendum)
 Pt educated on and understands discharge instructions.  Pt belongings with pt (cellphone).  Pt discharge instructions placed in discharge packet for receiving facility  --------- Pt daughter, Danella Deis, also given a copy of discharge instructions.  Daughter educated on and understands discharge instructions

## 2023-05-04 NOTE — NC FL2 (Signed)
 [place] MEDICAID FL2 LEVEL OF CARE FORM     IDENTIFICATION  Patient Name: [name] Birthdate: 18-Jul-1934 Sex: female Admission Date (Current Location): 04/29/2023  [facility] and [place] Number:  Producer, television/film/video and Address:  [facility],  [address] [place], [place] [address]      Provider Number: [id number]  Attending Physician Name and Address:  [name], MD  Relative Name and Phone Number:  Braulio Conte (daughter) Ph: [phone]    Current Level of Care: Hospital Recommended Level of Care: Skilled Nursing Facility Prior Approval Number:    Date Approved/Denied:   PASRR Number: [id number]  Discharge Plan: SNF    Current Diagnoses: Patient Active Problem List   Diagnosis Date Noted   Hypomagnesemia 05/01/2023   Dementia 04/30/2023   Hypothyroidism 04/30/2023   Recurrent UTI 04/30/2023   ESBL E. coli bacteremia 04/29/2023   Sepsis secondary to UTI (HCC) 03/30/2023   Prolonged QT interval 03/30/2023   Hyponatremia 03/30/2023   Physical deconditioning 03/23/2023   Xerostomia 03/09/2023   Pressure injury of skin 04/27/2022   Hypokalemia 04/27/2022   Bilateral pleural effusion 04/24/2022   Need for emotional support 04/24/2022   High risk medication use 04/24/2022   Pain 04/24/2022   DNR (do not resuscitate) 04/24/2022   End of life care 04/24/2022   Dyspnea 04/24/2022   Palliative care encounter 04/21/2022   Goals of care, counseling/discussion 04/21/2022   Counseling and coordination of care 04/21/2022   Malnutrition of moderate degree 04/20/2022   Atrial fibrillation with RVR (HCC) 04/18/2022   Hypotension 04/18/2022   Failure to thrive in adult 04/18/2022   Acute urinary retention 04/18/2022   RSV (respiratory syncytial virus pneumonia) 03/11/2022   COPD exacerbation (HCC) 03/09/2022   Paroxysmal atrial fibrillation (HCC) 03/09/2022   Acute respiratory failure with hypoxia (HCC) 03/09/2022   Bronchiectasis without  complication (HCC) 03/04/2022   URI (upper respiratory infection) 03/04/2022   Elevated troponin    Anemia 08/08/2021   Bladder spasm 08/08/2021   Chest pain 08/08/2021   Chest pain, rule out acute myocardial infarction 08/07/2021   Chronic diastolic CHF (congestive heart failure) (HCC) 02/03/2021   Cognitive decline 01/18/2021   Right knee pain 06/08/2020   Age-related osteoporosis without current pathological fracture 05/01/2020   Nasal congestion 05/01/2020   Personal history of COVID-19 05/01/2020   Bilateral hearing loss 11/15/2019   Post-nasal drainage 11/15/2019   COPD (chronic obstructive pulmonary disease) (HCC) 10/30/2019   Atrial fibrillation (HCC)    Adhesive capsulitis of right shoulder 07/29/2019   Metabolic encephalopathy 07/29/2019   Dysphagia due to suspected esophageal stenosis 07/18/2019   S/P reverse total shoulder arthroplasty, 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   Rotator cuff arthropathy of left shoulder 05/03/2019   Shoulder pain, left 04/29/2019   Low back pain 03/13/2019   Degeneration of lumbar intervertebral disc 02/14/2019   Amnesia 02/06/2018   ETD (Eustachian tube dysfunction), bilateral 11/07/2017   Chronic nonintractable headache 11/07/2017   Otalgia 10/17/2017   Pain in joint of right elbow 05/02/2017   Rib lesion 04/20/2017   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   Atopic dermatitis 04/08/2016   Skin sensation disturbance 03/08/2016   Weakness 12/22/2015   Allergic rhinitis 09/15/2015   CAD (coronary artery disease) 05/18/2015   Essential hypertension 05/18/2015   Carotid artery disease (HCC) 05/18/2015   Cough 03/19/2015   Shortness of breath 12/26/2013   Abnormal gait 11/06/2013   Idiopathic scoliosis and kyphoscoliosis 07/30/2013   Acquired unequal leg length on  left 04/11/2013   History of fracture 10/18/2012   Palpitations 10/18/2012   Constipation 09/05/2012   History of sinus tachycardia 09/05/2012   Spasm of back muscles  03/12/2012   Lumbar radiculopathy 11/23/2011   Mitral regurgitation 11/22/2011   Low compliance bladder 04/18/2011   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   Carpal tunnel syndrome 02/23/2009   Cardiovascular symptoms 02/23/2009   Vitamin D deficiency 02/23/2009   RECTAL BLEEDING 08/18/2008   History of colonic polyps 08/18/2008   SKIN CANCER, HX OF 08/14/2008   CARPAL TUNNEL SYNDROME, HX OF 08/14/2008   SARCOIDOSIS, PULMONARY 08/08/2008   HLD (hyperlipidemia) 08/08/2008   Paroxysmal supraventricular tachycardia (HCC) 08/08/2008   GERD (gastroesophageal reflux disease) 08/08/2008    Orientation RESPIRATION BLADDER Height & Weight     Self, Situation, Place  Normal Incontinent Weight: 126 lb 5.2 oz (57.3 kg) Height:  5\' 3"  (160 cm)  BEHAVIORAL SYMPTOMS/MOOD NEUROLOGICAL BOWEL NUTRITION STATUS      Incontinent Diet (Heart healthy diet)  AMBULATORY STATUS COMMUNICATION OF NEEDS Skin   Extensive Assist Verbally Normal                       Personal Care Assistance Level of Assistance  Bathing, Feeding, Dressing Bathing Assistance: Maximum assistance Feeding assistance: Limited assistance Dressing Assistance: Maximum assistance     Functional Limitations Info  Sight, Hearing, Speech Sight Info: Adequate Hearing Info: Adequate Speech Info: Adequate    SPECIAL CARE FACTORS FREQUENCY                       Contractures Contractures Info: Not present    Additional Factors Info  Code Status, Allergies, Psychotropic Code Status Info: DNR Allergies Info: Levaquin (Levofloxacin), Oxycodone, Oxycodone-acetaminophen, Sulfonamide Derivatives, Sulfa Antibiotics, Morphine Psychotropic Info: See discharge summary         Current Medications (05/04/2023):  This is the current hospital active medication list Current Facility-Administered Medications  Medication Dose Route Frequency Provider Last Rate Last Admin   acetaminophen (TYLENOL) tablet 650 mg  650 mg Oral Q4H  PRN Earlie Lou L, MD   650 mg at 04/29/23 2200   alum & mag hydroxide-simeth (MAALOX/MYLANTA) 200-200-20 MG/5ML suspension 30 mL  30 mL Oral Q8H PRN [name], MD   30 mL at 05/03/23 2202   apixaban (ELIQUIS) tablet 2.5 mg  2.5 mg Oral BID Earlie Lou L, MD   2.5 mg at 05/04/23 4782   ascorbic acid (VITAMIN C) tablet 500 mg  500 mg Oral Daily Earlie Lou L, MD   500 mg at 05/04/23 0926   ertapenem [facility]) 1 g in sodium chloride 0.9 % 100 mL IVPB  1 g Intravenous Q24H Odette Fraction, MD 200 mL/hr at 05/04/23 0929 1 g at 05/04/23 0929   ferrous sulfate tablet 325 mg  325 mg Oral Eugenia Mcalpine L, MD   325 mg at 05/04/23 0927   umeclidinium bromide (INCRUSE ELLIPTA) 62.5 MCG/ACT 1 puff  1 puff Inhalation Daily Lynden Ang, RPH   1 puff at 05/04/23 0804   And   fluticasone furoate-vilanterol (BREO ELLIPTA) 100-25 MCG/ACT 1 puff  1 puff Inhalation Daily Lynden Ang, RPH   1 puff at 05/04/23 0804   guaiFENesin (MUCINEX) 12 hr tablet 1,200 mg  1,200 mg Oral BID Earlie Lou L, MD   1,200 mg at 05/04/23 0927   ipratropium-albuterol (DUONEB) 0.5-2.5 (3) MG/3ML nebulizer solution 3 mL  3 mL Nebulization Q6H PRN Earlie Lou  L, MD       levothyroxine (SYNTHROID) tablet 100 mcg  100 mcg Oral Daily Earlie Lou L, MD   100 mcg at 05/04/23 0543   metoCLOPramide (REGLAN) tablet 5 mg  5 mg Oral Q8H PRN Rometta Emery, MD   5 mg at 05/04/23 0943   ondansetron (ZOFRAN) injection 4 mg  4 mg Intravenous Q6H PRN Rometta Emery, MD   4 mg at 05/04/23 8657   Oral care mouth rinse  15 mL Mouth Rinse PRN [name], MD       oxyCODONE (Oxy IR/ROXICODONE) immediate release tablet 5 mg  5 mg Oral Q6H PRN Rometta Emery, MD       sertraline (ZOLOFT) tablet 50 mg  50 mg Oral QHS Alberteen Sam, MD   50 mg at 05/03/23 2113   sodium chloride flush (NS) 0.9 % injection 10-40 mL  10-40 mL Intracatheter Q12H [name], MD   10 mL at 05/04/23 0809   sodium chloride  flush (NS) 0.9 % injection 10-40 mL  10-40 mL Intracatheter PRN [name], MD       sodium chloride flush (NS) 0.9 % injection 3 mL  3 mL Intravenous Q12H Earlie Lou L, MD   3 mL at 05/04/23 0809   sodium chloride flush (NS) 0.9 % injection 3 mL  3 mL Intravenous PRN Rometta Emery, MD       thiamine (VITAMIN B1) tablet 100 mg  100 mg Oral Daily Rometta Emery, MD   100 mg at 05/04/23 8469     Discharge Medications: Please see discharge summary for a list of discharge medications.  Relevant Imaging Results:  Relevant Lab Results:   Additional Information SSN: [id number]. Ertapenem 1gm IV daily until 05/13/23.  Ewing Schlein, LCSW

## 2023-05-30 ENCOUNTER — Inpatient Hospital Stay: Payer: Self-pay | Admitting: Infectious Diseases

## 2023-06-27 DIAGNOSIS — I480 Paroxysmal atrial fibrillation: Secondary | ICD-10-CM | POA: Diagnosis not present

## 2023-06-27 DIAGNOSIS — I10 Essential (primary) hypertension: Secondary | ICD-10-CM | POA: Diagnosis not present

## 2023-06-27 DIAGNOSIS — L0231 Cutaneous abscess of buttock: Secondary | ICD-10-CM | POA: Diagnosis not present

## 2023-07-25 DIAGNOSIS — I5022 Chronic systolic (congestive) heart failure: Secondary | ICD-10-CM

## 2023-07-25 DIAGNOSIS — L0231 Cutaneous abscess of buttock: Secondary | ICD-10-CM | POA: Diagnosis not present

## 2023-07-25 DIAGNOSIS — N3281 Overactive bladder: Secondary | ICD-10-CM | POA: Diagnosis not present

## 2023-07-25 DIAGNOSIS — R3981 Functional urinary incontinence: Secondary | ICD-10-CM | POA: Diagnosis not present

## 2023-07-25 DIAGNOSIS — N119 Chronic tubulo-interstitial nephritis, unspecified: Secondary | ICD-10-CM | POA: Diagnosis not present

## 2023-09-05 ENCOUNTER — Encounter: Payer: Self-pay | Admitting: *Deleted

## 2023-09-05 NOTE — Progress Notes (Unsigned)
 OFFICE NOTE:    Date:  09/06/2023  ID:  [name], DOB 1935-02-16, MRN [id number] PCP: Caleen Dirks, MD  [facility] Providers Cardiologist:  Ozell Fell, MD Cardiology APP:  Lelon Hamilton T, PA-C        Coronary artery disease Cath 3/17: oLCx 60-70 (neg by FFR) Myoview  11/16: no ischemia Myoview  07/2018: EF 73, prob low risk (significant extracardiac uptake), no ischemia Myoview  6/22: low risk  Cath 07/2021: LCx 60-70 (no ? from last cath), LAD 40, RCA 30 - Med Rx Paroxysmal atrial fibrillation  CHA2DS2-VASc=5 (CAD, HTN, age x 2, female) >> Apixaban  2.5 mg (> 30 yo, wt <60 kg)  (HFpEF) heart failure with preserved ejection fraction  Echo 8/21: EF 55-60, GR 1 DD, RVSP 24.2, trivial MR TTE 08/08/21: EF 55-60, no RWMA, Gr 2 DD, normal RVSF, normal PASP (RVSP 31.3), mild MR, mild to mod TR, AV sclerosis w/o AS  TTE 03/10/22: EF 60-65,                  TTE 05/25/22: EF 55, RVSP 30, mild MR TTE 04/30/23: EF 55-60, no RWMA, NL RVSF, mild pulm HTN, mild MR, RVSP 42.1  Aortic atherosclerosis  Pulmonary sarcoidosis Echo 8/21: RVSP 24.2 Echo 5/21: RVSP 36.9 Bronchiectasis Hypertension Hyperlipidemia SVT Carotid stenosis US  06/2018: bilat 1-39 Dementia         Discussed the use of AI scribe software for clinical note transcription with the patient, who gave verbal consent to proceed. History of Present Illness [name] is a 89 y.o. female who returns for follow up of CAD, CHF, AF. Last seen in 12/2022 by Orren Fabry, PA. She was admitted in 03/2023 and 04/2023 with ESBL E coli urosepsis.   She lives at [facility]  She is here with her daughter.  She reports increased shortness of breath and some increased lower extremity edema recently. She is currently on Lasix  20 mg daily. She is concerned about her mobility, as she cannot walk much due to knee pain and weakness. She reports coughing with clear drainage and increased shortness of breath, especially when getting up  to go to the bathroom. No chest discomfort, syncope, or hematochezia.   Review of Systems  Respiratory:  Positive for cough. Negative for hemoptysis.   Gastrointestinal:  Negative for hematochezia.  Genitourinary:  Negative for hematuria.  -See HPI    Studies Reviewed:      Results LABS Na: 133 (05/04/2023) K: 4 (05/04/2023) Cr: 0.65 (05/04/2023) ALT: 20 (05/03/2023) Hb: 12.3 (05/04/2023)  Risk Assessment/Calculations:  CHA2DS2-VASc Score = 6   This indicates a 9.7% annual risk of stroke. The patient's score is based upon: CHF History: 1 HTN History: 1 Diabetes History: 0 Stroke History: 0 Vascular Disease History: 1 Age Score: 2 Gender Score: 1           Physical Exam:  VS:  BP (!) 100/58   Pulse 68   Ht 5' 3 (1.6 m)   Wt 125 lb (56.7 kg)   LMP 02/22/1983 (Approximate)   SpO2 96%   BMI 22.14 kg/m        Wt Readings from Last 3 Encounters:  09/06/23 125 lb (56.7 kg)  05/04/23 126 lb 5.2 oz (57.3 kg)  03/31/23 119 lb 0.8 oz (54 kg)    Constitutional:      Appearance: Healthy appearance. Not in distress.  Neck:     Vascular: No JVR.  Pulmonary:     Breath sounds:  No wheezing. Rales (R mid lung) present.  Cardiovascular:     Normal rate. Regular rhythm.     Murmurs: There is no murmur.  Edema:    Peripheral edema absent.  Abdominal:     Palpations: Abdomen is soft.  Skin:    General: Skin is warm and dry.       Assessment and Plan:    Assessment & Plan Coronary artery disease involving native coronary artery of native heart without angina pectoris History of non-obstructive disease. No symptoms suggestive of angina. Not on platelet therapy due to Eliquis  use. Paroxysmal atrial fibrillation (HCC) Currently maintaining sinus rhythm. - Continue Eliquis  2.5 mg twice daily Chronic heart failure with preserved ejection fraction (HCC) Acute cough Shortness of breath Echo 04/2023 with EF 55-60.  She notes recent increase in shortness of breath and  lower extremity edema. Lungs clear except for rales in the right mid lung.  She also notes a recent cough.  She is mainly in a chair most of the day.  She is limited by significant knee pain from arthritis. - Increase furosemide  to 20 mg twice daily for three days, then resume 20 mg daily - Order chest x-ray to evaluate for pneumonia - Continue potassium 20 mEq daily - Follow-up 3 months Nonrheumatic mitral valve regurgitation Mild by recent echocardiogram       Dispo:  Return in about 3 months (around 12/07/2023) for Routine Follow Up, w/ Glendia Ferrier, PA-C.  Signed, Glendia Ferrier, PA-C

## 2023-09-06 ENCOUNTER — Encounter: Payer: Self-pay | Admitting: Physician Assistant

## 2023-09-06 ENCOUNTER — Ambulatory Visit: Admitting: Physician Assistant

## 2023-09-06 ENCOUNTER — Ambulatory Visit (HOSPITAL_COMMUNITY)
Admission: RE | Admit: 2023-09-06 | Discharge: 2023-09-06 | Disposition: A | Discharge status: Home / Self Care | Source: Ambulatory Visit | Attending: Physician Assistant | Admitting: Physician Assistant

## 2023-09-06 VITALS — BP 100/58 | HR 68 | Ht 63.0 in | Wt 125.0 lb

## 2023-09-06 DIAGNOSIS — I2510 Atherosclerotic heart disease of native coronary artery without angina pectoris: Secondary | ICD-10-CM | POA: Diagnosis not present

## 2023-09-06 DIAGNOSIS — R0602 Shortness of breath: Secondary | ICD-10-CM

## 2023-09-06 DIAGNOSIS — I340 Nonrheumatic mitral (valve) insufficiency: Secondary | ICD-10-CM | POA: Insufficient documentation

## 2023-09-06 DIAGNOSIS — I5032 Chronic diastolic (congestive) heart failure: Secondary | ICD-10-CM | POA: Diagnosis not present

## 2023-09-06 DIAGNOSIS — I480 Paroxysmal atrial fibrillation: Secondary | ICD-10-CM

## 2023-09-06 DIAGNOSIS — R051 Acute cough: Secondary | ICD-10-CM

## 2023-09-06 MED ORDER — FUROSEMIDE 20 MG PO TABS: ORAL_TABLET | ORAL | Status: AC

## 2023-09-06 NOTE — Assessment & Plan Note (Signed)
 Echo 04/2023 with EF 55-60.  She notes recent increase in shortness of breath and lower extremity edema. Lungs clear except for rales in the right mid lung.  She also notes a recent cough.  She is mainly in a chair most of the day.  She is limited by significant knee pain from arthritis. - Increase furosemide  to 20 mg twice daily for three days, then resume 20 mg daily - Order chest x-ray to evaluate for pneumonia - Continue potassium 20 mEq daily - Follow-up 3 months

## 2023-09-06 NOTE — Assessment & Plan Note (Signed)
 History of non-obstructive disease. No symptoms suggestive of angina. Not on platelet therapy due to Eliquis  use.

## 2023-09-06 NOTE — Assessment & Plan Note (Signed)
Mild by recent echocardiogram 

## 2023-09-06 NOTE — Assessment & Plan Note (Deleted)
 Echo 04/2023 with EF 55-60.  She notes recent increase in shortness of breath and lower extremity edema. Lungs clear except for rales in the right mid lung.  She also notes a recent cough.  She is mainly in a chair most of the day.  She is limited by significant knee pain from arthritis. - Increase furosemide  to 20 mg twice daily for three days, then resume 20 mg daily - Order chest x-ray to evaluate for pneumonia - Continue potassium 20 mEq daily - Follow-up 3 months

## 2023-09-06 NOTE — Patient Instructions (Signed)
 Medication Instructions:  Your physician recommends that you continue on your current medications as directed. Please refer to the Current Medication list given to you today.  *If you need a refill on your cardiac medications before your next appointment, please call your pharmacy*  Lab Work: None ordered  If you have labs (blood work) drawn today and your tests are completely normal, you will receive your results only by: MyChart Message (if you have MyChart) OR A paper copy in the mail If you have any lab test that is abnormal or we need to change your treatment, we will call you to review the results.  Testing/Procedures: Your physician recommends you have a Chest X-Ray TODAY. A chest x-ray takes a picture of the organs and structures inside the chest, including the heart, lungs, and blood vessels. This test can show several things, including, whether the heart is enlarges; whether fluid is building up in the lungs; and whether pacemaker / defibrillator leads are still in place.   Follow-Up: At [facility], you and your health needs are our priority.  As part of our continuing mission to provide you with exceptional heart care, our providers are all part of one team.  This team includes your primary Cardiologist (physician) and Advanced Practice Providers or APPs (Physician Assistants and Nurse Practitioners) who all work together to provide you with the care you need, when you need it.  Your next appointment:   3 month(s)  Provider:   Glendia Ferrier, PA-C          We recommend signing up for the patient portal called MyChart.  Sign up information is provided on this After Visit Summary.  MyChart is used to connect with patients for Virtual Visits (Telemedicine).  Patients are able to view lab/test results, encounter notes, upcoming appointments, etc.  Non-urgent messages can be sent to your provider as well.   To learn more about what you can do with MyChart, go to  [email or web address].   Other Instructions

## 2023-09-06 NOTE — Assessment & Plan Note (Signed)
 Currently maintaining sinus rhythm. - Continue Eliquis  2.5 mg twice daily

## 2023-09-07 ENCOUNTER — Encounter: Payer: Self-pay | Admitting: Physician Assistant

## 2023-09-07 ENCOUNTER — Ambulatory Visit: Payer: Self-pay | Admitting: Physician Assistant

## 2023-09-20 ENCOUNTER — Ambulatory Visit: Payer: 59 | Admitting: Nurse Practitioner

## 2023-09-27 ENCOUNTER — Encounter: Payer: Self-pay | Admitting: Emergency Medicine

## 2023-09-27 ENCOUNTER — Ambulatory Visit (INDEPENDENT_AMBULATORY_CARE_PROVIDER_SITE_OTHER): Admitting: Emergency Medicine

## 2023-09-27 VITALS — BP 126/74 | HR 78 | Temp 98.3°F | Ht 63.0 in | Wt 125.0 lb

## 2023-09-27 DIAGNOSIS — J479 Bronchiectasis, uncomplicated: Secondary | ICD-10-CM

## 2023-09-27 DIAGNOSIS — J449 Chronic obstructive pulmonary disease, unspecified: Secondary | ICD-10-CM | POA: Diagnosis not present

## 2023-09-27 DIAGNOSIS — J301 Allergic rhinitis due to pollen: Secondary | ICD-10-CM | POA: Diagnosis not present

## 2023-09-27 NOTE — Assessment & Plan Note (Signed)
 Overall stable regimen.  She does have some intermittent wheeze that is adequately treated with either albuterol  or nebulizers.  She has assistance at [facility] to help administer these on an as-needed basis.  We will plan to continue Breztri  2 puffs twice a day.  Rinse and gargle after using. Okay to use albuterol  2 puffs up to every 4 hours if needed for shortness of breath, chest tightness, wheezing. Okay to use your DuoNeb up to every 6 hours if needed for shortness of breath, chest tightness, wheezing. Continue Mucinex  DM as you have been taking it Please follow-up with Dr. Shelah in 1 year.  Call or message sooner if you have any problems especially changes in your breathing, cough, mucus production.

## 2023-09-27 NOTE — Progress Notes (Signed)
   Subjective:    Patient ID: [name], female    DOB: 1934-07-25, 89 y.o.   MRN: [id number]  COPD Her past medical history is significant for COPD.   ROV 09/27/2023 --follow-up visit for 89 year old woman with a history of sarcoidosis and associated severe obstructive lung disease/COPD with bronchiectasis.  She also has a history of atrial fibrillation with CHF, chronic rhinitis, GERD.  Last seen in our office 02/2023.  She was admitted in February and in March with pyelonephritis and ESBL secreting E. coli bacteremia. Has had recurrent UTI's, being treated now w bactrim . She is in assisted living, spends most of the day up to chair. No longer ambulates. She coughs a few times a day, clear mucous.  Has been managed on Breztri , mucinex  DM, zyrtec  Has guaifenesin  as needed, nasal saline as needed, fluticasone  nasal spray daily. She uses albuterol  pretty rarely.     Review of Systems As per HPI     Objective:   Physical Exam Vitals:   09/27/23 1604  BP: 126/74  Pulse: 78  Temp: 98.3 F (36.8 C)  SpO2: 92%  Weight: 125 lb (56.7 kg)  Height: 5' 3 (1.6 m)   Gen: Pleasant, kyphotic elderly woman, in no distress,  normal affect, wheelchair  ENT: No lesions,  mouth clear,  oropharynx clear, no postnasal drip  Neck: No JVD, no stridor  Lungs: No use of accessory muscles, distant, some end expiratory wheezing.  Occasional cough.  No crackles  Cardiovascular: RRR, heart sounds normal, no murmur or gallops, trace ankle peripheral edema  Musculoskeletal: No deformities, no cyanosis or clubbing  Neuro: alert, non focal.  Some poor memory but she does redirect when reminded  Skin: Warm, no lesions or rashes       Assessment & Plan:  COPD (chronic obstructive pulmonary disease) (HCC) Overall stable regimen.  She does have some intermittent wheeze that is adequately treated with either albuterol  or nebulizers.  She has assistance at [facility] to help administer these on an  as-needed basis.  We will plan to continue Breztri  2 puffs twice a day.  Rinse and gargle after using. Okay to use albuterol  2 puffs up to every 4 hours if needed for shortness of breath, chest tightness, wheezing. Okay to use your DuoNeb up to every 6 hours if needed for shortness of breath, chest tightness, wheezing. Continue Mucinex  DM as you have been taking it Please follow-up with Dr. Shelah in 1 year.  Call or message sooner if you have any problems especially changes in your breathing, cough, mucus production.  Bronchiectasis without complication (HCC) She does have some intermittent cough, is able to clear mucus without difficulty.  No recent bronchitis or pneumonia.  Plan to continue the Mucinex   Allergic rhinitis Continue cetirizine   Time spent 30 minutes  Lamar Shelah, MD, PhD 09/27/2023, 4:38 PM [facility] [phone] or if no answer [phone]

## 2023-09-27 NOTE — Assessment & Plan Note (Signed)
Continue cetirizine 

## 2023-09-27 NOTE — Patient Instructions (Signed)
 It is good to see you today. We will plan to continue Breztri  2 puffs twice a day.  Rinse and gargle after using. Okay to use albuterol  2 puffs up to every 4 hours if needed for shortness of breath, chest tightness, wheezing. Okay to use your DuoNeb up to every 6 hours if needed for shortness of breath, chest tightness, wheezing. Continue Mucinex  DM as you have been taking it Continue cetirizine  (Zyrtec ) as you have been taking it Please follow-up with Dr. Shelah in 1 year.  Call or message sooner if you have any problems especially changes in your breathing, cough, mucus production.

## 2023-09-27 NOTE — Assessment & Plan Note (Signed)
 She does have some intermittent cough, is able to clear mucus without difficulty.  No recent bronchitis or pneumonia.  Plan to continue the Mucinex 

## 2023-10-08 DIAGNOSIS — I890 Lymphedema, not elsewhere classified: Secondary | ICD-10-CM | POA: Diagnosis not present

## 2023-10-08 DIAGNOSIS — J309 Allergic rhinitis, unspecified: Secondary | ICD-10-CM | POA: Diagnosis not present

## 2023-10-08 DIAGNOSIS — R059 Cough, unspecified: Secondary | ICD-10-CM | POA: Diagnosis not present

## 2023-10-08 DIAGNOSIS — G4700 Insomnia, unspecified: Secondary | ICD-10-CM | POA: Diagnosis not present

## 2023-10-26 ENCOUNTER — Ambulatory Visit: Admitting: Internal Medicine

## 2023-11-22 DIAGNOSIS — G8929 Other chronic pain: Secondary | ICD-10-CM | POA: Diagnosis not present

## 2023-11-22 DIAGNOSIS — M179 Osteoarthritis of knee, unspecified: Secondary | ICD-10-CM | POA: Diagnosis not present

## 2023-11-27 DIAGNOSIS — G8929 Other chronic pain: Secondary | ICD-10-CM | POA: Diagnosis not present

## 2023-11-27 DIAGNOSIS — M1990 Unspecified osteoarthritis, unspecified site: Secondary | ICD-10-CM | POA: Diagnosis not present

## 2023-12-01 DIAGNOSIS — E039 Hypothyroidism, unspecified: Secondary | ICD-10-CM

## 2023-12-01 DIAGNOSIS — K219 Gastro-esophageal reflux disease without esophagitis: Secondary | ICD-10-CM | POA: Diagnosis not present

## 2023-12-01 DIAGNOSIS — M25562 Pain in left knee: Secondary | ICD-10-CM | POA: Diagnosis not present

## 2023-12-01 DIAGNOSIS — N3281 Overactive bladder: Secondary | ICD-10-CM | POA: Diagnosis not present

## 2023-12-01 DIAGNOSIS — F329 Major depressive disorder, single episode, unspecified: Secondary | ICD-10-CM | POA: Diagnosis not present

## 2023-12-05 ENCOUNTER — Encounter: Payer: Self-pay | Admitting: *Deleted

## 2023-12-05 NOTE — Assessment & Plan Note (Signed)
 Moderate non-obstructive coronary artery disease by prior cardiac catheterization. Most recent study in June 2023 showed 60-70% LCX stenosis, 40% LAD, and 30% RCA stenosis. No change from last cardiac catheterization. Managed medically and doing well without symptoms.

## 2023-12-05 NOTE — Assessment & Plan Note (Signed)
 Mild by most recent echocardiogram.***

## 2023-12-05 NOTE — Progress Notes (Unsigned)
 OFFICE NOTE:    Date:  12/06/2023  ID:  [name], DOB 1935/01/25, MRN [id number] PCP: Caleen Dirks, MD  [facility] Providers Cardiologist:  Ozell Fell, MD Cardiology APP:  Lelon Hamilton T, PA-C        Coronary artery disease Cath 3/17: oLCx 60-70 (neg by FFR) Myoview  11/16: no ischemia Myoview  07/2018: EF 73, prob low risk (significant extracardiac uptake), no ischemia Myoview  6/22: low risk  Cath 07/2021: LCx 60-70 (no ? from last cath), LAD 40, RCA 30 - Med Rx Paroxysmal atrial fibrillation  CHA2DS2-VASc=5 (CAD, HTN, age x 2, female) >> Apixaban  2.5 mg (> 20 yo, wt <60 kg)  (HFpEF) heart failure with preserved ejection fraction  Echo 8/21: EF 55-60, GR 1 DD, RVSP 24.2, trivial MR TTE 08/08/21: EF 55-60, no RWMA, Gr 2 DD, normal RVSF, normal PASP (RVSP 31.3), mild MR, mild to mod TR, AV sclerosis w/o AS  TTE 03/10/22: EF 60-65,                  TTE 05/25/22: EF 55, RVSP 30, mild MR TTE 04/30/23: EF 55-60, no RWMA, NL RVSF, mild pulm HTN, mild MR, RVSP 42.1  Mild mitral regurgitation Aortic atherosclerosis  Pulmonary sarcoidosis Echo 8/21: RVSP 24.2 Echo 5/21: RVSP 36.9 Bronchiectasis Hypertension Hyperlipidemia SVT Carotid stenosis US  06/2018: bilat 1-39 Dementia   Knee arthritis  R knee, severe, immobile due to DJD       Discussed the use of AI scribe software for clinical note transcription with the patient, who gave verbal consent to proceed. History of Present Illness [name] is a 89 y.o. female who returns for follow up of CAD, AFib, CHF. She was last seen 08/2023. She had been admitted in 03/2023 and 04/2023 with ESBL E.coli urosepsis. When I saw her, she was more short of breath and had LE edema. I increased her Lasix  for 3 days. CXR was neg for pneumonia.   She is here with her daughter.  She has not had lower extremity edema.  She is immobile secondary to severe knee arthritis.  She has not had orthopnea.  She notes increased dyspnea with  transfers but this is overall stable.  She has not had chest pain.    ROS-See HPI    Studies Reviewed:       Risk Assessment/Calculations:  CHA2DS2-VASc Score = 6   This indicates a 9.7% annual risk of stroke. The patient's score is based upon: CHF History: 1 HTN History: 1 Diabetes History: 0 Stroke History: 0 Vascular Disease History: 1 Age Score: 2 Gender Score: 1            Physical Exam:  VS:  BP (!) 122/50 (BP Location: Left Arm, Patient Position: Sitting, Cuff Size: Normal)   Pulse 65   Ht 5' 2 (1.575 m)   Wt 128 lb (58.1 kg)   LMP 02/22/1983 (Approximate)   BMI 23.41 kg/m        Wt Readings from Last 3 Encounters:  12/06/23 128 lb (58.1 kg)  09/27/23 125 lb (56.7 kg)  09/06/23 125 lb (56.7 kg)    Constitutional:      Appearance: Not in distress. Frail.  Neck:     Vascular: JVD normal.  Pulmonary:     Breath sounds: No wheezing. No rales.  Cardiovascular:     Normal rate. Regular rhythm.     Murmurs: There is no murmur.  Edema:    Peripheral edema absent.  Skin:  General: Skin is warm and dry.       Assessment and Plan:    Assessment & Plan Chronic heart failure with preserved ejection fraction (HCC) Echo 04/2023 with EF 55-60. Overall volume status is stable. Immobility due to severe knee arthritis. - Continue Lasix  20 mg daily - Continue Potassium 20 mEq daily - Arrange follow-up with BMET - Follow-up 6 months Paroxysmal atrial fibrillation (HCC) Maintaining sinus rhythm by exam today. On apixaban  2.5 mg twice daily due to age and weight. Previously on amiodarone , which was stopped due to hypothyroidism. - Continue apixaban  2.5 mg twice daily Coronary artery disease involving native coronary artery of native heart without angina pectoris Moderate non-obstructive coronary artery disease by prior cardiac catheterization. Most recent study in June 2023 showed 60-70% LCX stenosis, 40% LAD, and 30% RCA stenosis. No change from last cardiac  catheterization. Managed medically and doing well without symptoms. Nonrheumatic mitral valve regurgitation Mild by most recent echocardiogram. - Consider repeat echocardiogram in 2 to 3 years         Dispo:  Return in about 6 months (around 06/05/2024) for Routine Follow Up, w/ Dr. Wonda, or Glendia Ferrier, PA-C.  Signed, Glendia Ferrier, PA-C

## 2023-12-05 NOTE — Assessment & Plan Note (Signed)
 SABRA

## 2023-12-06 ENCOUNTER — Ambulatory Visit: Attending: Cardiovascular Disease | Admitting: Physician Assistant

## 2023-12-06 ENCOUNTER — Encounter: Payer: Self-pay | Admitting: Physician Assistant

## 2023-12-06 VITALS — BP 122/50 | HR 65 | Ht 62.0 in | Wt 128.0 lb

## 2023-12-06 DIAGNOSIS — K219 Gastro-esophageal reflux disease without esophagitis: Secondary | ICD-10-CM | POA: Diagnosis not present

## 2023-12-06 DIAGNOSIS — I480 Paroxysmal atrial fibrillation: Secondary | ICD-10-CM | POA: Diagnosis present

## 2023-12-06 DIAGNOSIS — I340 Nonrheumatic mitral (valve) insufficiency: Secondary | ICD-10-CM | POA: Insufficient documentation

## 2023-12-06 DIAGNOSIS — I5032 Chronic diastolic (congestive) heart failure: Secondary | ICD-10-CM | POA: Insufficient documentation

## 2023-12-06 DIAGNOSIS — E039 Hypothyroidism, unspecified: Secondary | ICD-10-CM | POA: Diagnosis not present

## 2023-12-06 DIAGNOSIS — G8929 Other chronic pain: Secondary | ICD-10-CM | POA: Diagnosis not present

## 2023-12-06 DIAGNOSIS — N393 Stress incontinence (female) (male): Secondary | ICD-10-CM | POA: Diagnosis not present

## 2023-12-06 DIAGNOSIS — I2510 Atherosclerotic heart disease of native coronary artery without angina pectoris: Secondary | ICD-10-CM | POA: Diagnosis present

## 2023-12-06 DIAGNOSIS — M1990 Unspecified osteoarthritis, unspecified site: Secondary | ICD-10-CM

## 2023-12-06 NOTE — Patient Instructions (Signed)
 Medication Instructions:  Your physician recommends that you continue on your current medications as directed. Please refer to the Current Medication list given to you today.  *If you need a refill on your cardiac medications before your next appointment, please call your pharmacy*  Lab Work: Please draw BMET and fax to our office at [phone] If you have labs (blood work) drawn today and your tests are completely normal, you will receive your results only by: MyChart Message (if you have MyChart) OR A paper copy in the mail If you have any lab test that is abnormal or we need to change your treatment, we will call you to review the results.  Testing/Procedures: None ordered  Follow-Up: At [facility], you and your health needs are our priority.  As part of our continuing mission to provide you with exceptional heart care, our providers are all part of one team.  This team includes your primary Cardiologist (physician) and Advanced Practice Providers or APPs (Physician Assistants and Nurse Practitioners) who all work together to provide you with the care you need, when you need it.  Your next appointment:   6 month(s)  Provider:   Ozell Fell, MD or Glendia Ferrier, PA-C

## 2024-01-02 ENCOUNTER — Other Ambulatory Visit: Payer: Self-pay | Admitting: Internal Medicine

## 2024-01-02 MED ORDER — BUPRENORPHINE HCL 150 MCG BU FILM: 150.0000 ug | ORAL_FILM | Freq: Two times a day (BID) | BUCCAL | 0 refills | Status: AC

## 2024-01-02 MED ORDER — BUPRENORPHINE HCL 150 MCG BU FILM: 150.0000 ug | ORAL_FILM | Freq: Two times a day (BID) | BUCCAL | 0 refills | Status: DC

## 2024-01-14 ENCOUNTER — Emergency Department (HOSPITAL_COMMUNITY)

## 2024-01-14 ENCOUNTER — Observation Stay (HOSPITAL_COMMUNITY)

## 2024-01-14 ENCOUNTER — Inpatient Hospital Stay (HOSPITAL_COMMUNITY)
Admission: EM | Admit: 2024-01-14 | Discharge: 2024-01-16 | DRG: 689 | Disposition: A | Discharge status: Skilled Nursing Facility | Source: Skilled Nursing Facility | Attending: Internal Medicine | Admitting: Internal Medicine

## 2024-01-14 DIAGNOSIS — I5033 Acute on chronic diastolic (congestive) heart failure: Secondary | ICD-10-CM | POA: Diagnosis not present

## 2024-01-14 DIAGNOSIS — R443 Hallucinations, unspecified: Secondary | ICD-10-CM | POA: Diagnosis present

## 2024-01-14 DIAGNOSIS — Z66 Do not resuscitate: Secondary | ICD-10-CM | POA: Diagnosis present

## 2024-01-14 DIAGNOSIS — N390 Urinary tract infection, site not specified: Principal | ICD-10-CM | POA: Diagnosis present

## 2024-01-14 DIAGNOSIS — D869 Sarcoidosis, unspecified: Secondary | ICD-10-CM | POA: Diagnosis not present

## 2024-01-14 DIAGNOSIS — I509 Heart failure, unspecified: Secondary | ICD-10-CM

## 2024-01-14 DIAGNOSIS — I480 Paroxysmal atrial fibrillation: Secondary | ICD-10-CM | POA: Diagnosis not present

## 2024-01-14 DIAGNOSIS — I080 Rheumatic disorders of both mitral and aortic valves: Secondary | ICD-10-CM | POA: Diagnosis present

## 2024-01-14 DIAGNOSIS — Z8619 Personal history of other infectious and parasitic diseases: Secondary | ICD-10-CM

## 2024-01-14 DIAGNOSIS — G9341 Metabolic encephalopathy: Secondary | ICD-10-CM | POA: Diagnosis present

## 2024-01-14 DIAGNOSIS — Z79890 Hormone replacement therapy (postmenopausal): Secondary | ICD-10-CM

## 2024-01-14 DIAGNOSIS — L89892 Pressure ulcer of other site, stage 2: Secondary | ICD-10-CM | POA: Diagnosis present

## 2024-01-14 DIAGNOSIS — R410 Disorientation, unspecified: Principal | ICD-10-CM

## 2024-01-14 DIAGNOSIS — J449 Chronic obstructive pulmonary disease, unspecified: Secondary | ICD-10-CM | POA: Diagnosis present

## 2024-01-14 DIAGNOSIS — E039 Hypothyroidism, unspecified: Secondary | ICD-10-CM | POA: Diagnosis present

## 2024-01-14 DIAGNOSIS — R140 Abdominal distension (gaseous): Secondary | ICD-10-CM | POA: Diagnosis present

## 2024-01-14 DIAGNOSIS — M810 Age-related osteoporosis without current pathological fracture: Secondary | ICD-10-CM | POA: Diagnosis present

## 2024-01-14 DIAGNOSIS — Z801 Family history of malignant neoplasm of trachea, bronchus and lung: Secondary | ICD-10-CM

## 2024-01-14 DIAGNOSIS — Z8673 Personal history of transient ischemic attack (TIA), and cerebral infarction without residual deficits: Secondary | ICD-10-CM

## 2024-01-14 DIAGNOSIS — I2510 Atherosclerotic heart disease of native coronary artery without angina pectoris: Secondary | ICD-10-CM | POA: Diagnosis present

## 2024-01-14 DIAGNOSIS — E785 Hyperlipidemia, unspecified: Secondary | ICD-10-CM | POA: Diagnosis present

## 2024-01-14 DIAGNOSIS — M7989 Other specified soft tissue disorders: Secondary | ICD-10-CM | POA: Diagnosis present

## 2024-01-14 DIAGNOSIS — G894 Chronic pain syndrome: Secondary | ICD-10-CM | POA: Diagnosis present

## 2024-01-14 DIAGNOSIS — I5032 Chronic diastolic (congestive) heart failure: Secondary | ICD-10-CM | POA: Diagnosis present

## 2024-01-14 DIAGNOSIS — L8990 Pressure ulcer of unspecified site, unspecified stage: Secondary | ICD-10-CM | POA: Diagnosis present

## 2024-01-14 DIAGNOSIS — D649 Anemia, unspecified: Secondary | ICD-10-CM | POA: Diagnosis present

## 2024-01-14 DIAGNOSIS — F0390 Unspecified dementia without behavioral disturbance: Secondary | ICD-10-CM | POA: Diagnosis present

## 2024-01-14 DIAGNOSIS — R32 Unspecified urinary incontinence: Secondary | ICD-10-CM | POA: Diagnosis present

## 2024-01-14 DIAGNOSIS — Z96612 Presence of left artificial shoulder joint: Secondary | ICD-10-CM | POA: Diagnosis present

## 2024-01-14 DIAGNOSIS — L89311 Pressure ulcer of right buttock, stage 1: Secondary | ICD-10-CM

## 2024-01-14 DIAGNOSIS — Z7901 Long term (current) use of anticoagulants: Secondary | ICD-10-CM

## 2024-01-14 DIAGNOSIS — Z8249 Family history of ischemic heart disease and other diseases of the circulatory system: Secondary | ICD-10-CM

## 2024-01-14 DIAGNOSIS — F0392 Unspecified dementia, unspecified severity, with psychotic disturbance: Secondary | ICD-10-CM | POA: Diagnosis present

## 2024-01-14 DIAGNOSIS — K219 Gastro-esophageal reflux disease without esophagitis: Secondary | ICD-10-CM | POA: Diagnosis not present

## 2024-01-14 DIAGNOSIS — D860 Sarcoidosis of lung: Secondary | ICD-10-CM | POA: Diagnosis present

## 2024-01-14 DIAGNOSIS — R441 Visual hallucinations: Secondary | ICD-10-CM | POA: Diagnosis present

## 2024-01-14 DIAGNOSIS — Z79899 Other long term (current) drug therapy: Secondary | ICD-10-CM

## 2024-01-14 DIAGNOSIS — Z87440 Personal history of urinary (tract) infections: Secondary | ICD-10-CM

## 2024-01-14 DIAGNOSIS — Z800 Family history of malignant neoplasm of digestive organs: Secondary | ICD-10-CM

## 2024-01-14 LAB — CBC WITH DIFFERENTIAL/PLATELET
Abs Immature Granulocytes: 0.11 K/uL — ABNORMAL HIGH (ref 0.00–0.07)
Basophils Absolute: 0 K/uL (ref 0.0–0.1)
Basophils Relative: 0 %
Eosinophils Absolute: 0.3 K/uL (ref 0.0–0.5)
Eosinophils Relative: 4 %
HCT: 40.7 % (ref 36.0–46.0)
Hemoglobin: 13.4 g/dL (ref 12.0–15.0)
Immature Granulocytes: 1 %
Lymphocytes Relative: 14 %
Lymphs Abs: 1.3 K/uL (ref 0.7–4.0)
MCH: 33 pg (ref 26.0–34.0)
MCHC: 32.9 g/dL (ref 30.0–36.0)
MCV: 100.2 fL — ABNORMAL HIGH (ref 80.0–100.0)
Monocytes Absolute: 0.7 K/uL (ref 0.1–1.0)
Monocytes Relative: 7 %
Neutro Abs: 6.8 K/uL (ref 1.7–7.7)
Neutrophils Relative %: 74 %
Platelets: 246 K/uL (ref 150–400)
RBC: 4.06 MIL/uL (ref 3.87–5.11)
RDW: 13 % (ref 11.5–15.5)
WBC: 9.3 K/uL (ref 4.0–10.5)
nRBC: 0 % (ref 0.0–0.2)

## 2024-01-14 LAB — COMPREHENSIVE METABOLIC PANEL WITH GFR
ALT: 15 U/L (ref 0–44)
AST: 32 U/L (ref 15–41)
Albumin: 4 g/dL (ref 3.5–5.0)
Alkaline Phosphatase: 209 U/L — ABNORMAL HIGH (ref 38–126)
Anion gap: 11 (ref 5–15)
BUN: 18 mg/dL (ref 8–23)
CO2: 25 mmol/L (ref 22–32)
Calcium: 9.8 mg/dL (ref 8.9–10.3)
Chloride: 101 mmol/L (ref 98–111)
Creatinine, Ser: 0.98 mg/dL (ref 0.44–1.00)
GFR, Estimated: 55 mL/min — ABNORMAL LOW (ref >60)
Glucose, Bld: 123 mg/dL — ABNORMAL HIGH (ref 70–99)
Potassium: 4 mmol/L (ref 3.5–5.1)
Sodium: 137 mmol/L (ref 135–145)
Total Bilirubin: 0.4 mg/dL (ref 0.0–1.2)
Total Protein: 7.1 g/dL (ref 6.5–8.1)

## 2024-01-14 LAB — URINALYSIS, ROUTINE W REFLEX MICROSCOPIC
Bilirubin Urine: NEGATIVE
Glucose, UA: NEGATIVE mg/dL
Ketones, ur: NEGATIVE mg/dL
Leukocytes,Ua: NEGATIVE
Nitrite: NEGATIVE
Protein, ur: NEGATIVE mg/dL
Specific Gravity, Urine: 1.008 (ref 1.005–1.030)
pH: 7 (ref 5.0–8.0)

## 2024-01-14 LAB — PRO BRAIN NATRIURETIC PEPTIDE: Pro Brain Natriuretic Peptide: 1187 pg/mL — ABNORMAL HIGH (ref <300.0)

## 2024-01-14 LAB — TROPONIN T, HIGH SENSITIVITY
Troponin T High Sensitivity: 35 ng/L — ABNORMAL HIGH (ref 0–19)
Troponin T High Sensitivity: 35 ng/L — ABNORMAL HIGH (ref 0–19)

## 2024-01-14 MED ORDER — SODIUM CHLORIDE 0.9 % IV SOLN
2.0000 g | Freq: Once | INTRAVENOUS | Status: AC
  Administered 2024-01-14: 2 g via INTRAVENOUS
  Filled 2024-01-14: qty 12.5

## 2024-01-14 MED ORDER — FUROSEMIDE 10 MG/ML IJ SOLN
40.0000 mg | Freq: Once | INTRAMUSCULAR | Status: AC
  Administered 2024-01-14: 40 mg via INTRAVENOUS
  Filled 2024-01-14: qty 4

## 2024-01-14 MED ORDER — SODIUM CHLORIDE 0.9 % IV SOLN: 2.0000 g | INTRAVENOUS | Status: DC

## 2024-01-14 NOTE — Assessment & Plan Note (Signed)
 Mild patient received 1 dose of Lasix  in the emergency department. Continue to monitor fluid status No peripheral edema at this time Patient appears to be euvolemic Denies any shortness of breath

## 2024-01-14 NOTE — ED Notes (Signed)
 MD did come and take pictures of pt wound for wound care.

## 2024-01-14 NOTE — Assessment & Plan Note (Signed)
 Continue eliquis ,

## 2024-01-14 NOTE — ED Notes (Signed)
 While giving Pt IV medication, I noted her pupils were 2 different sizes. MD, Patt stated this was baseline for pt.

## 2024-01-14 NOTE — Assessment & Plan Note (Signed)
 Obtain anemia panel  Transfuse for Hg <7 , rapidly dropping or  if symptomatic

## 2024-01-14 NOTE — Assessment & Plan Note (Signed)
 Monitor for signs of sundowning especially in the setting of admission and recent infection

## 2024-01-14 NOTE — Assessment & Plan Note (Addendum)
 Continue Prilosec

## 2024-01-14 NOTE — Assessment & Plan Note (Signed)
 Chronic stable patient denies any chest pain

## 2024-01-14 NOTE — ED Triage Notes (Signed)
 Pt arrives from EMS due to dx of UTI 3 days ago - antibiotics taken past 3 day - baseline a/o x3 now a/o x1 - hallucinating BP 116/70 HR 80 CBG 137 SpO2 95% room air.  Hx of knee injury - chronic

## 2024-01-14 NOTE — ED Notes (Signed)
 Noted wounds on pt bottom. Appears to be from her brief. Day shift reported it was there for them as well.

## 2024-01-14 NOTE — H&P (Signed)
 [name] FMW:[id number] DOB: [birth year] DOA: 01/14/2024     PCP: Caleen Dirks, MD   Outpatient Specialists:  CARDS:   Dr. Ozell Fell, MD    Patient arrived to ER on 01/14/24 at 1500 Referred by Attending Patt Alm Macho, MD   Patient coming from:   [name]  Chief Complaint:   Chief Complaint  Patient presents with   Altered Mental Status    HPI: [name] is a 89 y.o. female with medical history significant of A.fib on eliquis , diastolic, chf, COPD, HLD, CAD, Nonrheumatic mitral valve regurgitation    Presented with  hallucinations Frequency ago was diagnosed with UTI has been hallucinating Known history of atrial fibrillation for which she is taking Eliquis   Endorses cough has not been productive incontinent of urine no nausea no vomiting no abdominal pain Patient realizes she was hallucinating because she was talking to relatives who are not there Family also endorses some fluid gain some weight gain and some leg swelling Urine culture has been sent Patient noted to have evidence of incontinence and skin breakdown     Denies significant ETOH intake   Does not smoke     Regarding pertinent Chronic problems:    Hyperlipidemia - not on statins  Lipid Panel     Component Value Date/Time   CHOL 188 08/08/2021 0124   CHOL 149 11/27/2017 0800   TRIG 81 08/08/2021 0124   HDL 67 08/08/2021 0124   HDL 73 11/27/2017 0800   CHOLHDL 2.8 08/08/2021 0124   VLDL 16 08/08/2021 0124   LDLCALC 105 (H) 08/08/2021 0124   LDLCALC 63 11/27/2017 0800   LABVLDL 13 11/27/2017 0800       chronic CHF diastolic - last echo  Recent Results (from the past 56199 hours)  ECHOCARDIOGRAM COMPLETE   Collection Time: 04/30/23 12:42 PM  Result Value   Weight 1,904.77   Height 63   BP 109/78   Single Plane A2C EF 64.3   Single Plane A4C EF 70.9   Calc EF 66.3   S' Lateral 2.70   AR max vel 2.17   AV Area VTI 1.87   AV Mean grad 5.0   AV Peak grad 8.4    Ao pk vel 1.45   Area-P 1/2 3.17   AV Area mean vel 1.85   MV VTI 2.05   Est EF 55 - 60%   Narrative      ECHOCARDIOGRAM REPORT        IMPRESSIONS    1. Left ventricular ejection fraction, by estimation, is 55 to 60%. The left ventricle has normal function. The left ventricle has no regional wall motion abnormalities. Left ventricular diastolic parameters are indeterminate.  2. Right ventricular systolic function is normal. The right ventricular size is normal. There is mildly elevated pulmonary artery systolic pressure.  3. The mitral valve is normal in structure. Mild mitral valve regurgitation. No evidence of mitral stenosis.  4. The tricuspid valve is abnormal.  5. The aortic valve is tricuspid. There is mild calcification of the aortic valve. There is mild thickening of the aortic valve. Aortic valve regurgitation is not visualized. No aortic stenosis is present.  6. The inferior vena cava is dilated in size with >50% respiratory variability, suggesting right atrial pressure of 8 mmHg.             CAD  - On Aspirin , statin, betablocker, Plavix                 - *  followed by cardiology                - last cardiac cath        Hypothyroidism:   Lab Results  Component Value Date   TSH 2.427 03/30/2023   on synthroid       COPD -   not  on baseline oxygen        A. Fib -   atrial fibrillation CHA2DS2 vas score   6    current  on anticoagulation with  Eliquis ,          -  Rate control:         Dementia      While in ER:   Started on cefepime  for UTI given a dose of Lasix   Lab Orders         Urine Culture         CBC with Differential         Comprehensive metabolic panel         Pro Brain natriuretic peptide         Urinalysis, Routine w reflex microscopic -Urine, Clean Catch      CXR-  non acute  CT head - Moderate atrophy and diffuse white matter changes, similar to the prior exam. 3. Remote lacunar infarct within the right cerebellum.    Following  Medications were ordered in ER: Medications  ceFEPIme  (MAXIPIME ) 2 g in sodium chloride  0.9 % 100 mL IVPB (0 g Intravenous Stopped 01/14/24 2112)  furosemide  (LASIX ) injection 40 mg (40 mg Intravenous Given 01/14/24 2106)       ED Triage Vitals  Encounter Vitals Group     BP 01/14/24 1510 (!) 137/58     Girls Systolic BP Percentile --      Girls Diastolic BP Percentile --      Boys Systolic BP Percentile --      Boys Diastolic BP Percentile --      Pulse Rate 01/14/24 1510 75     Resp 01/14/24 1510 16     Temp 01/14/24 1510 98 F (36.7 C)     Temp Source 01/14/24 1510 Oral     SpO2 01/14/24 1510 100 %     Weight --      Height --      Head Circumference --      Peak Flow --      Pain Score 01/14/24 1508 10     Pain Loc --      Pain Education --      Exclude from Growth Chart --   UFJK(75)@     _________________________________________ Significant initial  Findings: Abnormal Labs Reviewed  CBC WITH DIFFERENTIAL/PLATELET - Abnormal; Notable for the following components:      Result Value   MCV 100.2 (*)    Abs Immature Granulocytes 0.11 (*)    All other components within normal limits  COMPREHENSIVE METABOLIC PANEL WITH GFR - Abnormal; Notable for the following components:   Glucose, Bld 123 (*)    Alkaline Phosphatase 209 (*)    GFR, Estimated 55 (*)    All other components within normal limits  PRO BRAIN NATRIURETIC PEPTIDE - Abnormal; Notable for the following components:   Pro Brain Natriuretic Peptide 1,187.0 (*)    All other components within normal limits  URINALYSIS, ROUTINE W REFLEX MICROSCOPIC - Abnormal; Notable for the following components:   Color, Urine STRAW (*)    Hgb urine dipstick SMALL (*)    Bacteria,  UA RARE (*)    All other components within normal limits  TROPONIN T, HIGH SENSITIVITY - Abnormal; Notable for the following components:   Troponin T High Sensitivity 35 (*)    All other components within normal limits  TROPONIN T, HIGH SENSITIVITY -  Abnormal; Notable for the following components:   Troponin T High Sensitivity 35 (*)    All other components within normal limits      _________________________ Troponin 35 - 35    ECG: Ordered Personally reviewed and interpreted by me showing: HR : 75 Rhythm:Sinus rhythm Abnormal R-wave progression, late transition LVH with secondary repolarization abnormality QTC 458  BNP (last 3 results) Recent Labs    04/03/23 0555 04/29/23 1606  BNP 572.4* 598.8*    The recent clinical data is shown below. Vitals:   01/14/24 1815 01/14/24 1830 01/14/24 1845 01/14/24 2120  BP: (!) 142/59 139/68 133/63   Pulse: 75 73 74   Resp:   18   Temp:    98.3 F (36.8 C)  TempSrc:    Oral  SpO2: 100% 96% 94%      WBC     Component Value Date/Time   WBC 9.3 01/14/2024 1618   LYMPHSABS 1.3 01/14/2024 1618   MONOABS 0.7 01/14/2024 1618   EOSABS 0.3 01/14/2024 1618   BASOSABS 0.0 01/14/2024 1618    UA  no evidence of UTI   Urine analysis:    Component Value Date/Time   COLORURINE STRAW (A) 01/14/2024 2141   APPEARANCEUR CLEAR 01/14/2024 2141   LABSPEC 1.008 01/14/2024 2141   PHURINE 7.0 01/14/2024 2141   GLUCOSEU NEGATIVE 01/14/2024 2141   HGBUR SMALL (A) 01/14/2024 2141   BILIRUBINUR NEGATIVE 01/14/2024 2141   BILIRUBINUR negative 03/21/2018 1328   KETONESUR NEGATIVE 01/14/2024 2141   PROTEINUR NEGATIVE 01/14/2024 2141   UROBILINOGEN negative (A) 03/21/2018 1328   UROBILINOGEN 0.2 12/08/2008 1204   NITRITE NEGATIVE 01/14/2024 2141   LEUKOCYTESUR NEGATIVE 01/14/2024 2141  ABX started Antibiotics Given (last 72 hours)     Date/Time Action Medication Dose Rate   01/14/24 2009 New Bag/Given   ceFEPIme  (MAXIPIME ) 2 g in sodium chloride  0.9 % 100 mL IVPB 2 g 200 mL/hr        No results found for the last 90 days.  __________________________________________________________ Recent Labs  Lab 01/14/24 1618  NA 137  K 4.0  CO2 25  GLUCOSE 123*  BUN 18  CREATININE 0.98   CALCIUM  9.8    Cr    stable,  Lab Results  Component Value Date   CREATININE 0.98 01/14/2024   CREATININE 0.65 05/04/2023   CREATININE 0.56 05/03/2023    Recent Labs  Lab 01/14/24 1618  AST 32  ALT 15  ALKPHOS 209*  BILITOT 0.4  PROT 7.1  ALBUMIN 4.0   Lab Results  Component Value Date   CALCIUM  9.8 01/14/2024   PHOS 3.7 08/10/2021    Plt: Lab Results  Component Value Date   PLT 246 01/14/2024    Recent Labs  Lab 01/14/24 1618  WBC 9.3  NEUTROABS 6.8  HGB 13.4  HCT 40.7  MCV 100.2*  PLT 246    HG/HCT  stable,      Component Value Date/Time   HGB 13.4 01/14/2024 1618   HGB 12.7 07/21/2020 1315   HCT 40.7 01/14/2024 1618   HCT 38.4 07/21/2020 1315   MCV 100.2 (H) 01/14/2024 1618   MCV 94 07/21/2020 1315    No results for input(s): LIPASE, AMYLASE  in the last 168 hours. No results for input(s): AMMONIA in the last 168 hours.    _______________________________________________ Hospitalist was called for admission for   Confusion  Hallucination  Acute on chronic heart failure,     The following Work up has been ordered so far:  Orders Placed This Encounter  Procedures   Urine Culture   DG Chest Port 1 View   CT HEAD WO CONTRAST ( )   CBC with Differential   Comprehensive metabolic panel   Pro Brain natriuretic peptide   Urinalysis, Routine w reflex microscopic -Urine, Clean Catch   In and Out Cath   Consult to hospitalist   EKG 12-Lead   EKG 12-Lead     OTHER Significant initial  Findings:  labs showing:     DM  labs:  HbA1C: No results for input(s): HGBA1C in the last 8760 hours.     CBG (last 3)  No results for input(s): GLUCAP in the last 72 hours.        Cultures:    Component Value Date/Time   SDES  04/30/2023 1853    URINE, RANDOM Performed at [facility], [address], [place], [place] [address]    SPECREQUEST  04/30/2023 1853    NONE Reflexed from [id number] Performed at [facility], [address] [place], [place] [address]    CULT  04/30/2023 1853    NO GROWTH Performed at [facility], [address] [place], [place] [address]    REPTSTATUS 05/01/2023 FINAL 04/30/2023 1853     Radiological Exams on Admission: CT HEAD WO CONTRAST ( ) Result Date: 01/14/2024 EXAM: CT HEAD WITHOUT CONTRAST 01/14/2024 04:56:07 PM TECHNIQUE: CT of the head was performed without the administration of intravenous contrast. Automated exposure control, iterative reconstruction, and/or weight based adjustment of the mA/kV was utilized to reduce the radiation dose to as low as reasonably achievable. COMPARISON: Similar to prior examination. CLINICAL HISTORY: Mental status change, unknown cause. Patient began hallucinating. She has been on antibiotics for 3 days for a urinary tract infection. FINDINGS: BRAIN AND VENTRICLES: No acute hemorrhage. No evidence of acute infarct. Moderate atrophy and diffuse white matter changes are similar to the prior exam. Remote lacunar infarct as again noted within the right cerebellum. Atherosclerotic calcifications are present in the cavernous carotid arteries bilaterally and at the dural margin of both vertebral arteries. No hyperdense vessel is present. No hydrocephalus. No extra-axial collection. No mass effect or midline shift. ORBITS: No acute abnormality. SINUSES: No acute abnormality. SOFT TISSUES AND SKULL: No acute soft tissue abnormality. No skull fracture. IMPRESSION: 1. No acute intracranial abnormality. 2. Moderate atrophy and diffuse white matter changes, similar to the prior exam. 3. Remote lacunar infarct within the right cerebellum. Electronically signed by: Lonni Necessary MD 01/14/2024 05:00 PM EST RP Workstation: [id number]   DG Chest Port 1 View Result Date: 01/14/2024 CLINICAL DATA:  Altered mental status. EXAM: PORTABLE CHEST 1 VIEW COMPARISON:  Chest radiograph dated 09/06/2023. FINDINGS: No focal consolidation,  pleural effusion or pneumothorax. Bibasilar atelectasis. Cardiac silhouette is within limits. Osteopenia degenerative changes of the spine and right shoulder. Total left shoulder arthroplasty. No acute osseous pathology. IMPRESSION: No active disease. Electronically Signed   By: Vanetta Chou M.D.   On: 01/14/2024 16:07   _______________________________________________________________________________________________________ Latest  Blood pressure 133/63, pulse 74, temperature 98.3 F (36.8 C), temperature source Oral, resp. rate 18, last menstrual period 02/22/1983, SpO2 94%.   Vitals  labs and radiology finding  personally reviewed  Review of Systems:    Pertinent positives include:   fatigue, hallucinations  Constitutional:  No weight loss, night sweats, Fevers, chills, weight loss  HEENT:  No headaches, Difficulty swallowing,Tooth/dental problems,Sore throat,  No sneezing, itching, ear ache, nasal congestion, post nasal drip,  Cardio-vascular:  No chest pain, Orthopnea, PND, anasarca, dizziness, palpitations.no Bilateral lower extremity swelling  GI:  No heartburn, indigestion, abdominal pain, nausea, vomiting, diarrhea, change in bowel habits, loss of appetite, melena, blood in stool, hematemesis Resp:  no shortness of breath at rest. No dyspnea on exertion, No excess mucus, no productive cough, No non-productive cough, No coughing up of blood.No change in color of mucus.No wheezing. Skin:  no rash or lesions. No jaundice GU:  no dysuria, change in color of urine, no urgency or frequency. No straining to urinate.  No flank pain.  Musculoskeletal:  No joint pain or no joint swelling. No decreased range of motion. No back pain.  Psych:  No change in mood or affect. No depression or anxiety. No memory loss.  Neuro: no localizing neurological complaints, no tingling, no weakness, no double vision, no gait abnormality, no slurred speech, no confusion  All systems reviewed and apart  from HOPI all are negative _______________________________________________________________________________________________ Past Medical History:   Past Medical History:  Diagnosis Date   Acute maxillary sinusitis 10/17/2017   Acute posthemorrhagic anemia 08/22/2012   Acute renal failure syndrome 01/04/2019   Allergy    SEASONAL   Anemia    Aortic atherosclerosis    Arthritis    Asthma    slight    Baker's cyst of knee, right    CAD (coronary artery disease)    Carotid stenosis    Closed fracture of right olecranon process 05/09/2017   Closed left subtrochanteric femur fracture S/P Open/closed and reduction, internal medullary fixation  09/05/2012   Dyspnea    due to Sarcoidosis. When is windy or humed   Edema 10/30/2019   Elbow fracture 04/2017   Right, had surgery   Esophageal reflux    Fracture of inferior pubic ramus (HCC) 02/28/2019   Hematochezia    Hip fracture (HCC) 12/12/2018   HLD (hyperlipidemia)    Hx of colonoscopy    Hydronephrosis 07/29/2019   Left foot pain 09/02/2014   Assessment: 89 year old with acquired leg length discrepancy on the left who presents with 10 days of acute onset left foot pain. She seemed to be doing well prior to this pain with her orthotics that had been made to correct her leg length discrepancy. Negative x-rays at outside office which were again reviewed today.   This pain does not seem to impair her function and which has actually been im   Macular pucker, right eye    will have removed on 01/26/21   Osteoporosis    Paroxysmal supraventricular tachycardia    Pneumonia    Pyelonephritis 07/29/2019   Sacral insufficiency fracture 12/12/2018   Sarcoidosis    Sepsis (HCC) 07/30/2019   Sepsis due to Escherichia coli (e. 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   COLONOSCOPY     CYSTOSCOPY W/ RETROGRADES Left 07/30/2019   Procedure: CYSTOSCOPY WITH RETROGRADE PYELOGRAM LEFT STENT PLACEMENT;  Surgeon: Cam Morene ORN, MD;  Location: [facility] ORS;  Service: Urology;  Laterality: Left;   CYSTOSCOPY WITH RETROGRADE PYELOGRAM, URETEROSCOPY AND STENT PLACEMENT Left 08/20/2019   Procedure: CYSTOSCOPY, URETEROSCOPY AND STENT PLACEMENT;  Surgeon: Elisabeth Valli BIRCH, MD;  Location: [facility] ORS;  Service: Urology;  Laterality: Left;  1 HR   FOOT SURGERY Right    HIP SURGERY Right 2011   full replacement   HOLMIUM LASER APPLICATION Left 08/20/2019   Procedure: HOLMIUM LASER APPLICATION;  Surgeon: Elisabeth Valli BIRCH, MD;  Location: [facility] ORS;  Service: Urology;  Laterality: Left;   LEFT HEART CATH AND CORONARY ANGIOGRAPHY N/A 08/09/2021   Procedure: LEFT HEART CATH AND CORONARY ANGIOGRAPHY;  Surgeon: Wonda Sharper, MD;  Location[facility]VAS[facility] CV LAB;  Service: Cardiovascular;  Laterality: N/A;   LEG SURGERY Left 06/2012   femur fracture s/p open and closed reduction in [place] , Dr. Trudy   lens replacement Right    right eye   ORIF ELBOW FRACTURE Right 05/09/2017   Procedure: ORIF right olecranon fracture with repair/reconstruction, ulnar nerve transposition as needed;  Surgeon: Camella Fallow, MD;  Location: [facility] OR;  Service: Orthopedics;  Laterality: Right;  Requests 90 mins   pelvis fracture     POLYPECTOMY     REVERSE SHOULDER ARTHROPLASTY Left 06/13/2019   Procedure: REVERSE SHOULDER ARTHROPLASTY;  Surgeon: Melita Drivers, MD;  Location: [facility] ORS;  Service: Orthopedics;  Laterality: Left;    SHOULDER ARTHROSCOPY W/ ROTATOR CUFF REPAIR Right    TRAPEZIUM RESECTION Right     Social  History:  Ambulatory   walker      reports that she has never smoked. She has never used smokeless tobacco. She reports that she does not drink alcohol  and does not use drugs.   Family History:   Family History  Problem Relation Age of Onset   Colon cancer Mother 10   Cancer Mother    Lung cancer Father    Heart disease Father    Arrhythmia Father    Cancer Father    Cancer Sister        ovarian   Heart attack Brother    Esophageal cancer Neg Hx    Rectal cancer Neg Hx    Stomach cancer Neg Hx    Stroke Neg Hx    ______________________________________________________________________________________________ Allergies: Allergies  Allergen Reactions   Levaquin  [Levofloxacin ] Other (See Comments)    Joint pain.. Per doctor, told to not to take again and allergic, per [facility]   Oxycodone  Nausea And Vomiting and Other (See Comments)    Can take oxycodone  with Zofran  (otherwise, GI Intolerance) Allergic, per [facility]   Oxycodone -Acetaminophen  Other (See Comments)    GI Intolerance and talking out of my head and allergic, per [facility]   Morphine  Nausea Only and Other (See Comments)    GI intolerance and allergic, per [facility]     Prior to Admission medications   Medication Sig Start Date End Date Taking? Authorizing Provider  acetaminophen  (TYLENOL ) 650 MG suppository Place 650 mg rectally every 4 (four) hours as needed for mild pain (pain score 1-3) or moderate pain (pain score 4-6).    [provider]  apixaban  (ELIQUIS ) 2.5 MG TABS tablet Take 2.5 mg by mouth 2 (two) times daily.    [provider]  ascorbic acid  (VITAMIN C ) 500 MG  tablet Take 500 mg by mouth daily.    [provider]  BIOFREEZE ROLL-ON 4 % GEL Apply 1 application  topically See admin instructions. Apply to both knees and the right should every 6 hours as needed for arthritic pain    [provider]  Budeson-Glycopyrrol-Formoterol  (BREZTRI  AEROSPHERE) 160-9-4.8 MCG/ACT AERO Inhale 2  puffs into the lungs in the morning and at bedtime. 09/29/21   Byrum, Robert S, MD  Buprenorphine  HCl 150 MCG FILM Place 150 mcg inside cheek in the morning and at bedtime. 01/02/24   Amin, Saad, MD  Calcium  Carbonate Antacid (TUMS ULTRA 1000 PO) Take 1,000 mg by mouth 2 (two) times daily.    [provider]  ferrous sulfate  325 (65 FE) MG tablet Take 325 mg by mouth See admin instructions. Take 325 mg by mouth EVERY OTHER morning- with orange juice    [provider]  furosemide  (LASIX ) 20 MG tablet Take 1 tab (20 mg) twice daily for 3 days. Then resume 1 tab (20 mg) once daily. 09/06/23   Lelon Hamilton T, PA-C  guaiFENesin  (MUCINEX ) 600 MG 12 hr tablet Take 2 tablets (1,200 mg total) by mouth 2 (two) times daily. 03/18/22   Cheryle Page, MD  ketoconazole (NIZORAL) 2 % cream Apply 1 Application topically daily. 09/04/23   [provider]  levothyroxine  (SYNTHROID ) 50 MCG tablet Take 100 mcg by mouth daily before breakfast. 11/25/22   [provider]  melatonin 5 MG TABS Take 5 mg by mouth at bedtime.    [provider]  methenamine  (HIPREX ) 1 g tablet Take 1 g by mouth daily. 02/23/23   [provider]  metoCLOPramide  (REGLAN ) 5 MG tablet Take 1 tablet (5 mg total) by mouth every 8 (eight) hours as needed for nausea or vomiting. 04/07/23 04/06/24  Drusilla Sabas RAMAN, MD  Miconazole Nitrate 2 % POWD 2 % by Does not apply route 3 (three) times daily.    [provider]  omeprazole (PRILOSEC) 40 MG capsule Take 40 mg by mouth in the morning and at bedtime.    [provider]  oxybutynin  (DITROPAN ) 5 MG tablet Take 5 mg by mouth daily. 11/25/22   [provider]  polyethylene glycol (MIRALAX  / GLYCOLAX ) 17 g packet Take 17 g by mouth daily as needed for mild constipation.    [provider]  potassium chloride  SA (KLOR-CON  M) 20 MEQ tablet Take 20 mEq by mouth daily.    [provider]  saccharomyces boulardii  (FLORASTOR) 250 MG capsule Take 1 capsule (250 mg total) by mouth 2 (two) times daily. 04/07/23   Drusilla Sabas RAMAN, MD  senna-docusate (SENOKOT-S) 8.6-50 MG tablet Take 1 tablet by mouth at bedtime as needed for mild constipation. 04/07/23   Drusilla Sabas RAMAN, MD  sertraline  (ZOLOFT ) 50 MG tablet Take 1 tablet (50 mg total) by mouth at bedtime. 12/23/21   Ines Onetha NOVAK, MD  sodium chloride  (OCEAN) 0.65 % SOLN nasal spray Place 1 spray into both nostrils 2 (two) times daily.    [provider]  SYSTANE COMPLETE 0.6 % SOLN Place 1 drop into both eyes in the morning and at bedtime.    [provider]  thiamine  (VITAMIN B-1) 100 MG tablet Take 1 tablet (100 mg total) by mouth daily. 04/08/23   Drusilla Sabas RAMAN, MD    ___________________________________________________________________________________________________ Physical Exam:    01/14/2024    6:45 PM 01/14/2024    6:30 PM 01/14/2024    6:15  PM  Vitals with BMI  Systolic 133 139 857  Diastolic 63 68 59  Pulse 74 73 75     1. General:  in No  Acute distress    Chronically ill   -appearing 2. Psychological: Alert and   Oriented 3. Head/ENT:  Dry Mucous Membranes                          Head Non traumatic, neck supple                    Poor Dentition 4. SKIN:  decreased Skin turgor,  Skin clean Dry and intact no rash    5. Heart: Regular rate and rhythm no  Murmur, no Rub or gallop 6. Lungs:  no wheezes or crackles   7. Abdomen: Soft,  non-tender,  distended  bowel sounds present 8. Lower extremities: no clubbing, cyanosis, no  edema 9. Neurologically Grossly intact, moving all 4 extremities equally   10. MSK: Normal range of motion    Chart has been reviewed  ______________________________________________________________________________________________  Assessment/Plan  89 y.o. female with medical history significant of A.fib on eliquis , diastolic, chf, COPD, HLD, CAD, Nonrheumatic mitral valve regurgitation   Admitted for   Confusion  Hallucination  Acute on chronic heart failure, unspecified heart failure type    Present on Admission:  Acute on chronic diastolic CHF (congestive heart failure) (HCC)  Paroxysmal atrial fibrillation (HCC)  GERD (gastroesophageal reflux disease)  SARCOIDOSIS, PULMONARY  Anemia  CAD (coronary artery disease)  Hallucinations  Metabolic encephalopathy  Pressure injury of skin  Abdominal distention  Dementia without behavioral disturbance (HCC)     Paroxysmal atrial fibrillation (HCC) Continue eliquis ,   GERD (gastroesophageal reflux disease) Continue Prilosec  SARCOIDOSIS, PULMONARY Chronic stable currently in remission  Anemia Obtain anemia panel  Transfuse for Hg <7 , rapidly dropping or  if symptomatic   Acute on chronic diastolic CHF (congestive heart failure) (HCC) Mild patient received 1 dose of Lasix  in the emergency department. Continue to monitor fluid status No peripheral edema at this time Patient appears to be euvolemic Denies any shortness of breath  CAD (coronary artery disease) Chronic stable patient denies any chest pain  Hallucinations Per family patient experiences hallucinations whenever she has urinary tract infection she had a urinary tract infection recently which was partially treated with Rocephin  Patient does have history of ESBL in the past but the recent urine culture per family was Klebsiella which was sensitive to Rocephin  and antibiotics were adjusted by her SNF provider repeat UA is improving patient has recently been on Flexeril and buprenorphine  for increased pain which can also have contributed to increased confusion in the setting of known dementia  Metabolic encephalopathy In the setting of recent infection polypharmacy Patient was started on cefepime  given underlying history of sepsis and UTIs and ongoing mental status changes urine appears to be partially treated.  Will continue for right now Hold sedating  medications for now  Pressure injury of skin Order wound care consult  Abdominal distention Start by obtaining KUB and bladder scan may need further imaging  Dementia without behavioral disturbance (HCC) Monitor for signs of sundowning especially in the setting of admission and recent infection     Other plan as per orders.  DVT prophylaxis: Eliquis    Code Status:  DNR/DNI  as per patient   I had personally discussed CODE STATUS with patient   ACP   none   Family  Communication:   Family   at  Bedside  plan of care was discussed  with  Daughter Husband,   Diet heart healthy   Disposition Plan:      Back to current facility when stable     Following barriers for discharge:                                                     Work of breathing improves       Consult Orders  (From admission, onward)           Start     Ordered   01/14/24 2151  Consult to hospitalist  Once       Provider:  (Not yet assigned)  Question Answer Comment  Place call to: [facility]   Reason for Consult Admit      01/14/24 2150                               Would benefit from PT/OT eval prior to DC  Ordered                    Transition of care consulted                     Consults called:    NONE   Admission status:  ED Disposition     ED Disposition  Admit   Condition  --   Comment  The patient appears reasonably stabilized for admission considering the current resources, flow, and capabilities available in the ED at this time, and I doubt any other [facility] requiring further screening and/or treatment in the ED prior to admission is  present.          Obs    Level of care     tele  For  24H     Lonzo Saulter 01/14/2024, 11:39 PM    [facility]     after 2 AM please page floor coverage   If 7AM-7PM, please contact the day team taking care of the patient using [email or web address]

## 2024-01-14 NOTE — Assessment & Plan Note (Signed)
Order wound care consult 

## 2024-01-14 NOTE — ED Provider Notes (Signed)
 [place] EMERGENCY DEPARTMENT AT [facility] Provider Note   CSN: [id number] Arrival date & time: 01/14/24  1500     Patient presents with: Altered Mental Status   [name] is a 89 y.o. female history of UTI, A-fib on Eliquis , here presenting with visual hallucinations.  Patient was diagnosed with urinary tract infection 3 days ago.  Patient has been on daily Rocephin  shots but developed visual hallucinations.  She states that she sometimes sees family members and then started talking to them but then she realized that they were not actually there.  Patient also has some nonproductive cough as well.  Patient states that she is incontinent and she denies any dysuria.  Denies any abdominal pain or vomiting.   The history is provided by the patient.       Prior to Admission medications   Medication Sig Start Date End Date Taking? Authorizing Provider  acetaminophen  (TYLENOL ) 650 MG suppository Place 650 mg rectally every 4 (four) hours as needed for mild pain (pain score 1-3) or moderate pain (pain score 4-6).    [provider]  apixaban  (ELIQUIS ) 2.5 MG TABS tablet Take 2.5 mg by mouth 2 (two) times daily.    [provider]  ascorbic acid  (VITAMIN C ) 500 MG tablet Take 500 mg by mouth daily.    [provider]  BIOFREEZE ROLL-ON 4 % GEL Apply 1 application  topically See admin instructions. Apply to both knees and the right should every 6 hours as needed for arthritic pain    [provider]  Budeson-Glycopyrrol-Formoterol  (BREZTRI  AEROSPHERE) 160-9-4.8 MCG/ACT AERO Inhale 2 puffs into the lungs in the morning and at bedtime. 09/29/21   Byrum, Robert S, MD  Buprenorphine  HCl 150 MCG FILM Place 150 mcg inside cheek in the morning and at bedtime. 01/02/24   Amin, Saad, MD  Calcium  Carbonate Antacid (TUMS ULTRA 1000 PO) Take 1,000 mg by mouth 2 (two) times daily.    [provider]  ferrous sulfate  325 (65 FE) MG tablet Take  325 mg by mouth See admin instructions. Take 325 mg by mouth EVERY OTHER morning- with orange juice    [provider]  furosemide  (LASIX ) 20 MG tablet Take 1 tab (20 mg) twice daily for 3 days. Then resume 1 tab (20 mg) once daily. 09/06/23   Lelon Hamilton T, PA-C  guaiFENesin  (MUCINEX ) 600 MG 12 hr tablet Take 2 tablets (1,200 mg total) by mouth 2 (two) times daily. 03/18/22   Cheryle Page, MD  ketoconazole (NIZORAL) 2 % cream Apply 1 Application topically daily. 09/04/23   [provider]  levothyroxine  (SYNTHROID ) 50 MCG tablet Take 100 mcg by mouth daily before breakfast. 11/25/22   [provider]  melatonin 5 MG TABS Take 5 mg by mouth at bedtime.    [provider]  methenamine  (HIPREX ) 1 g tablet Take 1 g by mouth daily. 02/23/23   [provider]  metoCLOPramide  (REGLAN ) 5 MG tablet Take 1 tablet (5 mg total) by mouth every 8 (eight) hours as needed for nausea or vomiting. 04/07/23 04/06/24  Drusilla Sabas GORMAN, MD  Miconazole Nitrate 2 % POWD 2 % by Does not apply route 3 (three) times daily.    [provider]  omeprazole (PRILOSEC) 40 MG capsule Take 40 mg by mouth in the morning and at bedtime.    [provider]  oxybutynin  (DITROPAN ) 5 MG tablet Take 5 mg by mouth daily. 11/25/22   [provider]  polyethylene glycol (MIRALAX  / GLYCOLAX ) 17 g packet Take 17 g by mouth daily as needed for mild constipation.    [provider]  potassium chloride  SA (KLOR-CON  M) 20 MEQ tablet Take 20 mEq by mouth daily.    [provider]  saccharomyces boulardii (FLORASTOR) 250 MG capsule Take 1 capsule (250 mg total) by mouth 2 (two) times daily. 04/07/23   Drusilla Sabas RAMAN, MD  senna-docusate (SENOKOT-S) 8.6-50 MG tablet Take 1 tablet by mouth at bedtime as needed for mild constipation. 04/07/23   Drusilla Sabas RAMAN, MD  sertraline  (ZOLOFT ) 50 MG tablet Take 1 tablet (50 mg total) by mouth at bedtime. 12/23/21   Ines Onetha NOVAK, MD   sodium chloride  (OCEAN) 0.65 % SOLN nasal spray Place 1 spray into both nostrils 2 (two) times daily.    [provider]  SYSTANE COMPLETE 0.6 % SOLN Place 1 drop into both eyes in the morning and at bedtime.    [provider]  thiamine  (VITAMIN B-1) 100 MG tablet Take 1 tablet (100 mg total) by mouth daily. 04/08/23   Drusilla Sabas RAMAN, MD    Allergies: Levaquin  [levofloxacin ], Oxycodone , Oxycodone -acetaminophen , and Morphine     Review of Systems  Psychiatric/Behavioral:  Positive for hallucinations.   All other systems reviewed and are negative.   Updated Vital Signs BP (!) 137/58 (BP Location: Right Arm)   Pulse 75   Temp 98 F (36.7 C) (Oral)   Resp 16   LMP 02/22/1983 (Approximate)   SpO2 100%   Physical Exam Vitals and nursing note reviewed.  Constitutional:      Comments: Confused and chronically ill  HENT:     Head: Normocephalic.     Nose: Nose normal.     Mouth/Throat:     Mouth: Mucous membranes are moist.  Eyes:     Extraocular Movements: Extraocular movements intact.     Pupils: Pupils are equal, round, and reactive to light.  Cardiovascular:     Rate and Rhythm: Normal rate and regular rhythm.     Pulses: Normal pulses.     Heart sounds: Normal heart sounds.  Pulmonary:     Effort: Pulmonary effort is normal.     Breath sounds: Normal breath sounds.  Abdominal:     General: Abdomen is flat.     Palpations: Abdomen is soft.  Musculoskeletal:        General: Normal range of motion.     Cervical back: Normal range of motion and neck supple.  Skin:    General: Skin is warm.     Capillary Refill: Capillary refill takes less than 2 seconds.  Neurological:     Comments: ANO x 1.  Patient has normal strength bilateral arms and legs  Psychiatric:        Mood and Affect: Mood normal.        Behavior: Behavior normal.     (all labs ordered are listed, but only abnormal results are displayed) Labs Reviewed  URINE CULTURE  CBC WITH  DIFFERENTIAL/PLATELET  COMPREHENSIVE METABOLIC PANEL WITH GFR  PRO BRAIN NATRIURETIC PEPTIDE  URINALYSIS, ROUTINE W REFLEX MICROSCOPIC  TROPONIN T, HIGH SENSITIVITY    EKG: EKG Interpretation Date/Time:  Sunday January 14 2024 15:14:16 EST Ventricular Rate:  75 PR Interval:  161 QRS Duration:  106 QT Interval:  410 QTC Calculation: 458 R Axis:   -30  Text Interpretation: Sinus rhythm Abnormal R-wave progression, late transition LVH with secondary repolarization abnormality No significant change  since last tracing Confirmed by Patt Alm DEL ([id number]) on 01/14/2024 3:40:04 PM  Radiology: [facility] Chest Port 1 View Result Date: 01/14/2024 CLINICAL DATA:  Altered mental status. EXAM: PORTABLE CHEST 1 VIEW COMPARISON:  Chest radiograph dated 09/06/2023. FINDINGS: No focal consolidation, pleural effusion or pneumothorax. Bibasilar atelectasis. Cardiac silhouette is within limits. Osteopenia degenerative changes of the spine and right shoulder. Total left shoulder arthroplasty. No acute osseous pathology. IMPRESSION: No active disease. Electronically Signed   By: Vanetta Chou M.D.   On: 01/14/2024 16:07     Procedures   Medications Ordered in the ED - No data to display                                  Medical Decision Making [name] is a 89 y.o. female presenting with visual hallucinations.  Consider brain mass versus side effect of medicine versus worsening dementia versus electrolyte abnormality versus worsening UTI or pneumonia.  Plan to get CBC and CMP and CT head and chest x-ray and urinalysis.    9:50 PM Initially, urine was sent but last in the lab.  Repeat urinalysis was done and patient does have rare bacteria.  CT head did not show any acute infarct.  Patient does have intermittent hallucinations and confusion.  Her BNP is elevated and per the family, patient has been having some weight gain and some leg swelling.  Given Lasix .  At this point patient be admitted for  encephalopathy and potentially heart failure exacerbation versus complicated UTI.  Urine culture sent.   Problems Addressed: Acute on chronic heart failure, unspecified heart failure type [facility]): acute illness or injury Confusion: acute illness or injury Hallucination: acute illness or injury  Amount and/or Complexity of Data Reviewed Labs: ordered. Decision-making details documented in ED Course. Radiology: ordered and independent interpretation performed. Decision-making details documented in ED Course. ECG/medicine tests: ordered.  Risk Prescription drug management.     Final diagnoses:  None    ED Discharge Orders     None          Patt Alm Macho, MD 01/14/24 2152

## 2024-01-14 NOTE — Subjective & Objective (Signed)
 Frequency ago was diagnosed with UTI has been hallucinating Known history of atrial fibrillation for which she is taking Eliquis   Endorses cough has not been productive incontinent of urine no nausea no vomiting no abdominal pain Patient realizes she was hallucinating because she was talking to relatives who are not there Family also endorses some fluid gain some weight gain and some leg swelling Urine culture has been sent Patient noted to have evidence of incontinence and skin breakdown

## 2024-01-14 NOTE — Progress Notes (Signed)
 ED Pharmacy Antibiotic Sign Off An antibiotic consult was received from an ED provider for cefepime  per pharmacy dosing for UTI. A chart review was completed to assess appropriateness.   The following one time order(s) were placed:  Cefepime  2 gm  Further antibiotic and/or antibiotic pharmacy consults should be ordered by the admitting provider if indicated.   Thank you for allowing pharmacy to be a part of this patient's care.   Rosaline IVAR Edison, Pharm.D Use secure chat for questions 01/14/2024 7:37 PM Clinical Pharmacist 01/14/24 7:36 PM

## 2024-01-14 NOTE — Assessment & Plan Note (Signed)
 Start by obtaining KUB and bladder scan may need further imaging

## 2024-01-14 NOTE — Assessment & Plan Note (Signed)
 In the setting of recent infection polypharmacy Patient was started on cefepime  given underlying history of sepsis and UTIs and ongoing mental status changes urine appears to be partially treated.  Will continue for right now Hold sedating medications for now

## 2024-01-14 NOTE — Assessment & Plan Note (Signed)
 Chronic stable currently in remission

## 2024-01-14 NOTE — Assessment & Plan Note (Signed)
 Per family patient experiences hallucinations whenever she has urinary tract infection she had a urinary tract infection recently which was partially treated with Rocephin  Patient does have history of ESBL in the past but the recent urine culture per family was Klebsiella which was sensitive to Rocephin  and antibiotics were adjusted by her SNF provider repeat UA is improving patient has recently been on Flexeril and buprenorphine  for increased pain which can also have contributed to increased confusion in the setting of known dementia

## 2024-01-14 NOTE — ED Notes (Signed)
Xray with pt 

## 2024-01-15 ENCOUNTER — Observation Stay (HOSPITAL_COMMUNITY)

## 2024-01-15 ENCOUNTER — Encounter (HOSPITAL_COMMUNITY): Payer: Self-pay | Admitting: Internal Medicine

## 2024-01-15 ENCOUNTER — Other Ambulatory Visit: Payer: Self-pay

## 2024-01-15 DIAGNOSIS — I080 Rheumatic disorders of both mitral and aortic valves: Secondary | ICD-10-CM | POA: Diagnosis present

## 2024-01-15 DIAGNOSIS — G894 Chronic pain syndrome: Secondary | ICD-10-CM | POA: Diagnosis present

## 2024-01-15 DIAGNOSIS — I5033 Acute on chronic diastolic (congestive) heart failure: Secondary | ICD-10-CM | POA: Diagnosis not present

## 2024-01-15 DIAGNOSIS — Z8249 Family history of ischemic heart disease and other diseases of the circulatory system: Secondary | ICD-10-CM | POA: Diagnosis not present

## 2024-01-15 DIAGNOSIS — I2510 Atherosclerotic heart disease of native coronary artery without angina pectoris: Secondary | ICD-10-CM | POA: Diagnosis present

## 2024-01-15 DIAGNOSIS — Z66 Do not resuscitate: Secondary | ICD-10-CM | POA: Diagnosis present

## 2024-01-15 DIAGNOSIS — J449 Chronic obstructive pulmonary disease, unspecified: Secondary | ICD-10-CM | POA: Diagnosis present

## 2024-01-15 DIAGNOSIS — Z8673 Personal history of transient ischemic attack (TIA), and cerebral infarction without residual deficits: Secondary | ICD-10-CM | POA: Diagnosis not present

## 2024-01-15 DIAGNOSIS — Z96612 Presence of left artificial shoulder joint: Secondary | ICD-10-CM | POA: Diagnosis present

## 2024-01-15 DIAGNOSIS — I5032 Chronic diastolic (congestive) heart failure: Secondary | ICD-10-CM | POA: Diagnosis present

## 2024-01-15 DIAGNOSIS — L89892 Pressure ulcer of other site, stage 2: Secondary | ICD-10-CM | POA: Diagnosis present

## 2024-01-15 DIAGNOSIS — Z8619 Personal history of other infectious and parasitic diseases: Secondary | ICD-10-CM | POA: Diagnosis not present

## 2024-01-15 DIAGNOSIS — D649 Anemia, unspecified: Secondary | ICD-10-CM | POA: Diagnosis present

## 2024-01-15 DIAGNOSIS — E039 Hypothyroidism, unspecified: Secondary | ICD-10-CM | POA: Diagnosis present

## 2024-01-15 DIAGNOSIS — R32 Unspecified urinary incontinence: Secondary | ICD-10-CM | POA: Diagnosis present

## 2024-01-15 DIAGNOSIS — R441 Visual hallucinations: Secondary | ICD-10-CM | POA: Diagnosis present

## 2024-01-15 DIAGNOSIS — N390 Urinary tract infection, site not specified: Secondary | ICD-10-CM | POA: Diagnosis present

## 2024-01-15 DIAGNOSIS — K219 Gastro-esophageal reflux disease without esophagitis: Secondary | ICD-10-CM | POA: Diagnosis present

## 2024-01-15 DIAGNOSIS — E785 Hyperlipidemia, unspecified: Secondary | ICD-10-CM | POA: Diagnosis present

## 2024-01-15 DIAGNOSIS — Z7901 Long term (current) use of anticoagulants: Secondary | ICD-10-CM | POA: Diagnosis not present

## 2024-01-15 DIAGNOSIS — F0392 Unspecified dementia, unspecified severity, with psychotic disturbance: Secondary | ICD-10-CM | POA: Diagnosis present

## 2024-01-15 DIAGNOSIS — Z79890 Hormone replacement therapy (postmenopausal): Secondary | ICD-10-CM | POA: Diagnosis not present

## 2024-01-15 DIAGNOSIS — G9341 Metabolic encephalopathy: Secondary | ICD-10-CM | POA: Diagnosis present

## 2024-01-15 DIAGNOSIS — R410 Disorientation, unspecified: Secondary | ICD-10-CM | POA: Diagnosis present

## 2024-01-15 DIAGNOSIS — I480 Paroxysmal atrial fibrillation: Secondary | ICD-10-CM | POA: Diagnosis present

## 2024-01-15 DIAGNOSIS — R140 Abdominal distension (gaseous): Secondary | ICD-10-CM | POA: Diagnosis present

## 2024-01-15 LAB — MAGNESIUM: Magnesium: 2.2 mg/dL (ref 1.7–2.4)

## 2024-01-15 LAB — COMPREHENSIVE METABOLIC PANEL WITH GFR
ALT: 14 U/L (ref 0–44)
AST: 25 U/L (ref 15–41)
Albumin: 3.7 g/dL (ref 3.5–5.0)
Alkaline Phosphatase: 179 U/L — ABNORMAL HIGH (ref 38–126)
Anion gap: 12 (ref 5–15)
BUN: 18 mg/dL (ref 8–23)
CO2: 25 mmol/L (ref 22–32)
Calcium: 9.5 mg/dL (ref 8.9–10.3)
Chloride: 102 mmol/L (ref 98–111)
Creatinine, Ser: 0.92 mg/dL (ref 0.44–1.00)
GFR, Estimated: 60 mL/min — ABNORMAL LOW (ref >60)
Glucose, Bld: 102 mg/dL — ABNORMAL HIGH (ref 70–99)
Potassium: 3.6 mmol/L (ref 3.5–5.1)
Sodium: 138 mmol/L (ref 135–145)
Total Bilirubin: 0.5 mg/dL (ref 0.0–1.2)
Total Protein: 6.4 g/dL — ABNORMAL LOW (ref 6.5–8.1)

## 2024-01-15 LAB — ECHOCARDIOGRAM COMPLETE
Area-P 1/2: 3.99 cm2
Calc EF: 63 %
Height: 63 in
S' Lateral: 2.3 cm
Single Plane A2C EF: 65.7 %
Single Plane A4C EF: 63.6 %
Weight: 2102.31 [oz_av]

## 2024-01-15 LAB — CBC
HCT: 37.9 % (ref 36.0–46.0)
Hemoglobin: 12.3 g/dL (ref 12.0–15.0)
MCH: 32.6 pg (ref 26.0–34.0)
MCHC: 32.5 g/dL (ref 30.0–36.0)
MCV: 100.5 fL — ABNORMAL HIGH (ref 80.0–100.0)
Platelets: 253 K/uL (ref 150–400)
RBC: 3.77 MIL/uL — ABNORMAL LOW (ref 3.87–5.11)
RDW: 13.1 % (ref 11.5–15.5)
WBC: 9 K/uL (ref 4.0–10.5)
nRBC: 0 % (ref 0.0–0.2)

## 2024-01-15 LAB — PHOSPHORUS: Phosphorus: 2.7 mg/dL (ref 2.5–4.6)

## 2024-01-15 MED ORDER — SENNOSIDES-DOCUSATE SODIUM 8.6-50 MG PO TABS
1.0000 | ORAL_TABLET | Freq: Every evening | ORAL | Status: DC | PRN
  Administered 2024-01-15: 1 via ORAL
  Filled 2024-01-15: qty 1

## 2024-01-15 MED ORDER — METOCLOPRAMIDE HCL 10 MG PO TABS: 5.0000 mg | ORAL_TABLET | Freq: Three times a day (TID) | ORAL | Status: DC | PRN

## 2024-01-15 MED ORDER — SODIUM CHLORIDE 0.9 % IV SOLN
1.0000 g | Freq: Two times a day (BID) | INTRAVENOUS | Status: DC
  Administered 2024-01-15 (×2): 1 g via INTRAVENOUS
  Filled 2024-01-15 (×3): qty 20

## 2024-01-15 MED ORDER — FUROSEMIDE 40 MG PO TABS
40.0000 mg | ORAL_TABLET | Freq: Every day | ORAL | Status: DC
  Administered 2024-01-15 – 2024-01-16 (×2): 40 mg via ORAL
  Filled 2024-01-15 (×2): qty 1

## 2024-01-15 MED ORDER — LEVOTHYROXINE SODIUM 100 MCG PO TABS
100.0000 ug | ORAL_TABLET | Freq: Every day | ORAL | Status: DC
  Administered 2024-01-15 – 2024-01-16 (×2): 100 ug via ORAL
  Filled 2024-01-15 (×2): qty 1

## 2024-01-15 MED ORDER — POLYETHYLENE GLYCOL 3350 17 G PO PACK
17.0000 g | PACK | Freq: Every day | ORAL | Status: DC | PRN
  Administered 2024-01-15: 17 g via ORAL
  Filled 2024-01-15: qty 1

## 2024-01-15 MED ORDER — SODIUM CHLORIDE 0.9% FLUSH
3.0000 mL | Freq: Two times a day (BID) | INTRAVENOUS | Status: DC
  Administered 2024-01-15 – 2024-01-16 (×4): 3 mL via INTRAVENOUS

## 2024-01-15 MED ORDER — ACETAMINOPHEN 325 MG PO TABS
650.0000 mg | ORAL_TABLET | Freq: Four times a day (QID) | ORAL | Status: DC | PRN
  Administered 2024-01-15 – 2024-01-16 (×2): 650 mg via ORAL
  Filled 2024-01-15 (×2): qty 2

## 2024-01-15 MED ORDER — SODIUM CHLORIDE 0.9 % IV SOLN: 250.0000 mL | INTRAVENOUS | Status: DC | PRN

## 2024-01-15 MED ORDER — APIXABAN 2.5 MG PO TABS
2.5000 mg | ORAL_TABLET | Freq: Two times a day (BID) | ORAL | Status: DC
  Administered 2024-01-15 – 2024-01-16 (×4): 2.5 mg via ORAL
  Filled 2024-01-15 (×4): qty 1

## 2024-01-15 MED ORDER — SODIUM CHLORIDE 0.9% FLUSH: 3.0000 mL | INTRAVENOUS | Status: DC | PRN

## 2024-01-15 MED ORDER — SERTRALINE HCL 50 MG PO TABS
50.0000 mg | ORAL_TABLET | Freq: Every day | ORAL | Status: DC
  Administered 2024-01-15 (×2): 50 mg via ORAL
  Filled 2024-01-15 (×2): qty 1

## 2024-01-15 MED ORDER — ACETAMINOPHEN 650 MG RE SUPP: 650.0000 mg | Freq: Four times a day (QID) | RECTAL | Status: DC | PRN

## 2024-01-15 MED ORDER — PANTOPRAZOLE SODIUM 40 MG PO TBEC
40.0000 mg | DELAYED_RELEASE_TABLET | Freq: Every day | ORAL | Status: DC
  Administered 2024-01-15 – 2024-01-16 (×2): 40 mg via ORAL
  Filled 2024-01-15 (×2): qty 1

## 2024-01-15 MED ORDER — BUDESON-GLYCOPYRROL-FORMOTEROL 160-9-4.8 MCG/ACT IN AERO
2.0000 | INHALATION_SPRAY | Freq: Two times a day (BID) | RESPIRATORY_TRACT | Status: DC
  Administered 2024-01-15 – 2024-01-16 (×3): 2 via RESPIRATORY_TRACT
  Filled 2024-01-15: qty 5.9

## 2024-01-15 NOTE — Progress Notes (Signed)
2D echo attempted, respiratory with patient. Will try later

## 2024-01-15 NOTE — Progress Notes (Signed)
 Mobility Specialist - Progress Note   01/15/24 1542  Mobility  Activity Pivoted/transferred from chair to bed  Level of Assistance +2 (takes two people)  Press Photographer wheel walker  Range of Motion/Exercises Active  Activity Response Tolerated well  Mobility visit 1 Mobility  Mobility Specialist Start Time (ACUTE ONLY) 1530  Mobility Specialist Stop Time (ACUTE ONLY) 1542  Mobility Specialist Time Calculation (min) (ACUTE ONLY) 12 min   NT requested assistance to transfer pt back to bed. A EOS was left in bed with all needs met. Call bell in reach.   Erminio Leos,  Mobility Specialist Can be reached via Secure Chat

## 2024-01-15 NOTE — NC FL2 (Signed)
 [place]  MEDICAID FL2 LEVEL OF CARE FORM     IDENTIFICATION  Patient Name: [name] Birthdate: 1935/01/08 Sex: female Admission Date (Current Location): 01/14/2024  [facility] and [place] Number:  Producer, Television/film/video and Address:  [facility],  [address], [place] [address]      Provider Number: [id number]  Attending Physician Name and Address:  Raenelle Coria, MD  Relative Name and Phone Number:  Claris [phone]    Current Level of Care: Hospital Recommended Level of Care: Other (Comment) (LTC) Prior Approval Number:    Date Approved/Denied:   PASRR Number:    Discharge Plan: Other (Comment) [facility] LTC)    Current Diagnoses: Patient Active Problem List   Diagnosis Date Noted   Acute on chronic diastolic CHF (congestive heart failure) (HCC) 01/14/2024   Hallucinations 01/14/2024   Abdominal distention 01/14/2024   Hypomagnesemia 05/01/2023   Dementia 04/30/2023   Hypothyroidism 04/30/2023   Recurrent UTI 04/30/2023   ESBL E. coli bacteremia 04/29/2023   Sepsis secondary to UTI (HCC) 03/30/2023   Prolonged QT interval 03/30/2023   Hyponatremia 03/30/2023   Physical deconditioning 03/23/2023   Xerostomia 03/09/2023   Pressure injury of skin 04/27/2022   Hypokalemia 04/27/2022   Bilateral pleural effusion 04/24/2022   Need for emotional support 04/24/2022   High risk medication use 04/24/2022   Pain 04/24/2022   DNR (do not resuscitate) 04/24/2022   End of life care 04/24/2022   Dyspnea 04/24/2022   Palliative care encounter 04/21/2022   Goals of care, counseling/discussion 04/21/2022   Counseling and coordination of care 04/21/2022   Malnutrition of moderate degree 04/20/2022   Atrial fibrillation with RVR (HCC) 04/18/2022   Hypotension 04/18/2022   Failure to thrive in adult 04/18/2022   Acute urinary retention 04/18/2022   RSV (respiratory syncytial virus pneumonia) 03/11/2022   COPD exacerbation (HCC)  03/09/2022   Paroxysmal atrial fibrillation (HCC) 03/09/2022   Acute respiratory failure with hypoxia (HCC) 03/09/2022   Bronchiectasis without complication (HCC) 03/04/2022   URI (upper respiratory infection) 03/04/2022   Elevated troponin    Anemia 08/08/2021   Bladder spasm 08/08/2021   Chest pain 08/08/2021   Chest pain, rule out acute myocardial infarction 08/07/2021   Chronic diastolic CHF (congestive heart failure) (HCC) 02/03/2021   Dementia without behavioral disturbance (HCC) 01/18/2021   Right knee pain 06/08/2020   Age-related osteoporosis without current pathological fracture 05/01/2020   Nasal congestion 05/01/2020   Personal history of COVID-19 05/01/2020   Bilateral hearing loss 11/15/2019   Post-nasal drainage 11/15/2019   COPD (chronic obstructive pulmonary disease) (HCC) 10/30/2019   Atrial fibrillation (HCC)    Adhesive capsulitis of right shoulder 07/29/2019   Metabolic encephalopathy 07/29/2019   Dysphagia due to suspected esophageal stenosis 07/18/2019   S/P reverse total shoulder arthroplasty, 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   Rotator cuff arthropathy of left shoulder 05/03/2019   Shoulder pain, left 04/29/2019   Low back pain 03/13/2019   Degeneration of lumbar intervertebral disc 02/14/2019   Amnesia 02/06/2018   ETD (Eustachian tube dysfunction), bilateral 11/07/2017   Chronic nonintractable headache 11/07/2017   Otalgia 10/17/2017   Pain in joint of right elbow 05/02/2017   Rib lesion 04/20/2017   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   Atopic dermatitis 04/08/2016   Skin sensation disturbance 03/08/2016   Weakness 12/22/2015   Allergic rhinitis 09/15/2015   CAD (coronary artery disease) 05/18/2015   Essential hypertension 05/18/2015   Carotid artery disease 05/18/2015   Cough 03/19/2015  Shortness of breath 12/26/2013   Abnormal gait 11/06/2013   Idiopathic scoliosis and kyphoscoliosis 07/30/2013   Acquired unequal leg length on left  04/11/2013   History of fracture 10/18/2012   Palpitations 10/18/2012   Constipation 09/05/2012   History of sinus tachycardia 09/05/2012   Spasm of back muscles 03/12/2012   Lumbar radiculopathy 11/23/2011   Mitral regurgitation 11/22/2011   Low compliance bladder 04/18/2011   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   Carpal tunnel syndrome 02/23/2009   Cardiovascular symptoms 02/23/2009   Vitamin D  deficiency 02/23/2009   RECTAL BLEEDING 08/18/2008   History of colonic polyps 08/18/2008   SKIN CANCER, HX OF 08/14/2008   CARPAL TUNNEL SYNDROME, HX OF 08/14/2008   SARCOIDOSIS, PULMONARY 08/08/2008   HLD (hyperlipidemia) 08/08/2008   Paroxysmal supraventricular tachycardia (HCC) 08/08/2008   GERD (gastroesophageal reflux disease) 08/08/2008    Orientation RESPIRATION BLADDER Height & Weight     Self  Normal Incontinent Weight: 59.6 kg Height:  5' 3 (160 cm)  BEHAVIORAL SYMPTOMS/MOOD NEUROLOGICAL BOWEL NUTRITION STATUS      Incontinent Diet  AMBULATORY STATUS COMMUNICATION OF NEEDS Skin   Total Care Verbally PU Stage and Appropriate Care, Other (Comment) (Buttock L/R foam dsg;L upper arm midline-iv abx-merrem  see d/c summary)   PU Stage 2 Dressing: Daily                   Personal Care Assistance Level of Assistance  Bathing, Feeding, Dressing, Total care Bathing Assistance: Maximum assistance Feeding assistance: Maximum assistance Dressing Assistance: Maximum assistance Total Care Assistance: Maximum assistance   Functional Limitations Info  Sight, Hearing, Speech Sight Info: Impaired (eyeglasses) Hearing Info: Adequate Speech Info: Adequate    SPECIAL CARE FACTORS FREQUENCY                       Contractures Contractures Info: Not present    Additional Factors Info  Code Status, Allergies Code Status Info: DNR Allergies Info: Levaquin  (Levofloxacin ), Oxycodone , Oxycodone -acetaminophen , Morphine            Current Medications (01/15/2024):  This  is the current hospital active medication list Current Facility-Administered Medications  Medication Dose Route Frequency Provider Last Rate Last Admin   0.9 %  sodium chloride  infusion  250 mL Intravenous PRN Doutova, Anastassia, MD       acetaminophen  (TYLENOL ) tablet 650 mg  650 mg Oral Q6H PRN Doutova, Anastassia, MD       Or   acetaminophen  (TYLENOL ) suppository 650 mg  650 mg Rectal Q6H PRN Doutova, Anastassia, MD       apixaban  (ELIQUIS ) tablet 2.5 mg  2.5 mg Oral BID Doutova, Anastassia, MD   2.5 mg at 01/15/24 1030   budesonide -glycopyrrolate -formoterol  (BREZTRI ) 160-9-4.8 MCG/ACT inhaler 2 puff  2 puff Inhalation BID Doutova, Anastassia, MD   2 puff at 01/15/24 0804   furosemide  (LASIX ) tablet 40 mg  40 mg Oral Daily Doutova, Anastassia, MD   40 mg at 01/15/24 1030   levothyroxine  (SYNTHROID ) tablet 100 mcg  100 mcg Oral QAC breakfast Doutova, Anastassia, MD   100 mcg at 01/15/24 9362   meropenem  (MERREM ) 1 g in sodium chloride  0.9 % 100 mL IVPB  1 g Intravenous Q12H Ellington, Abby K, RPH 200 mL/hr at 01/15/24 1036 1 g at 01/15/24 1036   metoCLOPramide  (REGLAN ) tablet 5 mg  5 mg Oral Q8H PRN Doutova, Anastassia, MD       pantoprazole  (PROTONIX ) EC tablet 40 mg  40 mg Oral Daily Doutova,  Anastassia, MD   40 mg at 01/15/24 1030   polyethylene glycol (MIRALAX  / GLYCOLAX ) packet 17 g  17 g Oral Daily PRN Doutova, Anastassia, MD   17 g at 01/15/24 0326   senna-docusate (Senokot-S) tablet 1 tablet  1 tablet Oral QHS PRN Doutova, Anastassia, MD   1 tablet at 01/15/24 0326   sertraline  (ZOLOFT ) tablet 50 mg  50 mg Oral QHS Doutova, Anastassia, MD   50 mg at 01/15/24 0047   sodium chloride  flush (NS) 0.9 % injection 3 mL  3 mL Intravenous Q12H Doutova, Anastassia, MD   3 mL at 01/15/24 1031   sodium chloride  flush (NS) 0.9 % injection 3 mL  3 mL Intravenous PRN Doutova, Anastassia, MD         Discharge Medications: Please see discharge summary for a list of discharge medications.  Relevant  Imaging Results:  Relevant Lab Results:   Additional Information [id number] [place], [place]

## 2024-01-15 NOTE — Progress Notes (Signed)
 Heart Failure Navigator Progress Note  Assessed for Heart & Vascular TOC clinic readiness.  Patient does not meet criteria due to per MD patient with history of Dementia. NO HF TOC . SABRA   Navigator will sign off at this time.    Stephane Haddock, BSN, Scientist, Clinical (histocompatibility And Immunogenetics) Only

## 2024-01-15 NOTE — Progress Notes (Signed)
  Echocardiogram 2D Echocardiogram has been performed.  [name] 01/15/2024, 9:57 AM

## 2024-01-15 NOTE — Progress Notes (Signed)
 Mobility Specialist - Progress Note   01/15/24 0830  Mobility  Activity Pivoted/transferred from bed to chair  Level of Assistance +2 (takes two people)  Assistive Device Front wheel walker  Activity Response Tolerated fair  Mobility visit 1 Mobility  Mobility Specialist Start Time (ACUTE ONLY) 0820  Mobility Specialist Stop Time (ACUTE ONLY) 0830  Mobility Specialist Time Calculation (min) (ACUTE ONLY) 10 min   NT requested assistance to transfer pt to recliner chair. Was max +2 for transfer. At EOS was left with all needs met. NT in room.   Erminio Leos,  Mobility Specialist Can be reached via Secure Chat

## 2024-01-15 NOTE — Evaluation (Signed)
 Occupational Therapy Evaluation and Discharge Patient Details Name: [name] MRN: [id number] DOB: 1934-04-01 Today's Date: 01/15/2024   History of Present Illness   Pt is an 89 year old woman admitted from [facility] where she is a resident with visual hallucinations in the setting of UTI. PMH: chronic R knee pain, afib, dCHF, HLD, CAD, MVR.     Clinical Impressions Pt is functioning at her baseline in mobility and ADLs. She transfers only with assist of staff, uses a w/c for mobility and is dependent in bathing, dressing, toileting and all IADLs. Pt is pleasant and cooperative. Reports she loves living at [facility]. No further OT needs.      If plan is discharge home, recommend the following:   Two people to help with walking and/or transfers;A lot of help with bathing/dressing/bathroom     Functional Status Assessment   Patient has not had a recent decline in their functional status     Equipment Recommendations   None recommended by OT     Recommendations for Other Services         Precautions/Restrictions   Precautions Precautions: Fall Recall of Precautions/Restrictions: Intact Restrictions Weight Bearing Restrictions Per Provider Order: No     Mobility Bed Mobility Overal bed mobility: Needs Assistance Bed Mobility: Supine to Sit, Sit to Supine     Supine to sit: Mod assist, +2 for physical assistance Sit to supine: +2 for physical assistance, Mod assist   General bed mobility comments: assist to raise trunk and for hips to EOB , for LEs back into bed and to guide trunk    Transfers Overall transfer level: Needs assistance Equipment used: Rolling walker (2 wheels) Transfers: Sit to/from Stand Sit to Stand: +2 physical assistance, Mod assist, From elevated surface           General transfer comment: unable to fully extend R knee, assist to rise and steady, poor tolerance of standing due to pain      Balance Overall balance  assessment: Needs assistance   Sitting balance-Leahy Scale: Fair       Standing balance-Leahy Scale: Zero                             ADL either performed or assessed with clinical judgement   ADL Overall ADL's : At baseline                                             Vision Ability to See in Adequate Light: 0 Adequate Patient Visual Report: No change from baseline       Perception         Praxis         Pertinent Vitals/Pain Pain Assessment Pain Assessment: Faces Faces Pain Scale: Hurts little more Pain Location: R knee Pain Descriptors / Indicators: Discomfort, Grimacing, Guarding Pain Intervention(s): Monitored during session, Repositioned     Extremity/Trunk Assessment Upper Extremity Assessment Upper Extremity Assessment: Right hand dominant;RUE deficits/detail;LUE deficits/detail RUE Deficits / Details: lonstanding shoulder limitations with crepitua RUE Coordination: decreased gross motor LUE Deficits / Details: baseline shoulder deficits, s/p shoulder surgery LUE Coordination: decreased gross motor   Lower Extremity Assessment Lower Extremity Assessment: Defer to PT evaluation   Cervical / Trunk Assessment Cervical / Trunk Assessment: Kyphotic   Communication Communication Communication: Impaired Factors Affecting Communication: Hearing  impaired   Cognition Arousal: Alert Behavior During Therapy: WFL for tasks assessed/performed Cognition: History of cognitive impairments             OT - Cognition Comments: pt is baseline memory deficits                 Following commands: Intact       Cueing  General Comments   Cueing Techniques: Verbal cues      Exercises     Shoulder Instructions      Home Living Family/patient expects to be discharged to:: Skilled nursing facility                                        Prior Functioning/Environment               Mobility  Comments: per chart, dependent in transfers 1-2 person, propels her w/c in facility per pt ADLs Comments: pt self feeds and grooms, otherwise dependent    OT Problem List:     OT Treatment/Interventions:        OT Goals(Current goals can be found in the care plan section)       OT Frequency:       Co-evaluation              AM-PAC OT 6 Clicks Daily Activity     Outcome Measure Help from another person eating meals?: None Help from another person taking care of personal grooming?: A Little Help from another person toileting, which includes using toliet, bedpan, or urinal?: Total Help from another person bathing (including washing, rinsing, drying)?: A Lot Help from another person to put on and taking off regular upper body clothing?: A Lot Help from another person to put on and taking off regular lower body clothing?: Total 6 Click Score: 13   End of Session Equipment Utilized During Treatment: Gait belt;Rolling walker (2 wheels)  Activity Tolerance: Patient tolerated treatment well Patient left: in bed;with call bell/phone within reach;with bed alarm set  OT Visit Diagnosis: Pain;Other symptoms and signs involving cognitive function;Muscle weakness (generalized) (M62.81);Unsteadiness on feet (R26.81)                Time: 1001-1020 OT Time Calculation (min): 19 min Charges:  OT General Charges $OT Visit: 1 Visit OT Evaluation $OT Eval Moderate Complexity: 1 Mod Mliss HERO, OTR/L Acute Rehabilitation Services Office: [phone]  Kennth Mliss Helling 01/15/2024, 10:32 AM

## 2024-01-15 NOTE — Progress Notes (Signed)
 PROGRESS NOTE    [name]  FMW:[id number] DOB: 27-Aug-1934 DOA: 01/14/2024 PCP: Caleen Dirks, MD    Brief Narrative:  89 year old with dementia, paroxysmal A-fib on Eliquis , diastolic heart failure, COPD, hypertensive hyperlipidemia, recurrent UTIs, previous history of ESBL E. coli brought from nursing home with complaints of hallucination.  Patient was seeing her relatives who were not there.  Patient was recently diagnosed with UTI and was getting intramuscular Rocephin  and had already received 3 doses of Rocephin .  According to the family, it was Klebsiella pneumonia that was treated in her urine. In the emergency room, hemodynamically stable.  UA was abnormal.  Started on cefepime , however with history of ESBL changing to meropenem .  Subjective: Patient seen and examined.  No overnight events.  Still confused but more composed and continues to worry about why she had hallucinations. Patient does realize that she had hallucinations. Remains afebrile. No evidence of urinary retention.   Assessment & Plan:   Acute UTI present on admission, previous history of ESBL E. coli.  Treated with Rocephin  at the nursing home. Urine cultures, blood cultures Start on meropenem  until culture reports available. Family to bring outpatient records Check for postvoid residuals.  Acute encephalopathy, hallucinations in a patient with dementia: Likely due to above. CT head with moderate atrophy and diffuse white matter changes similar to previous scans.  No focal deficits. Delirium precautions.  Frequent reorientation.  PT OT.  Fall precautions.  Chronic diastolic congestive heart failure: Patient does not have any evidence of fluid overload.  No peripheral edema.  Euvolemic on exam.  Echocardiogram essentially unchanged.  Symptomatic management.  Chronic medical issues including Paroxysmal A-fib, on Eliquis  GERD, on Prilosec Dementia without behavioral disturbances, as above.    DVT  prophylaxis: apixaban  (ELIQUIS ) tablet 2.5 mg Start: 01/15/24 0100 SCDs Start: 01/15/24 0015 apixaban  (ELIQUIS ) tablet 2.5 mg   Code Status: DNR with limited intervention Family Communication: Daughter on the phone Disposition Plan: Status is: Inpatient Remains inpatient appropriate because: IV antibiotics     Consultants:  None  Procedures:  None  Antimicrobials:  Cefepime  11/23--- Meropenem  11/24---     Objective: Vitals:   01/15/24 0337 01/15/24 0804 01/15/24 0823 01/15/24 1227  BP: (!) 123/59  (!) 134/46 (!) 123/52  Pulse: 77  80 80  Resp: 18  18 18   Temp: 98.2 F (36.8 C)   98.6 F (37 C)  TempSrc: Oral   Oral  SpO2: 95% 95% 95% 93%  Weight:      Height:        Intake/Output Summary (Last 24 hours) at 01/15/2024 1233 Last data filed at 01/15/2024 0900 Gross per 24 hour  Intake 320 ml  Output 600 ml  Net -280 ml   Filed Weights   01/15/24 0012  Weight: 59.6 kg    Examination:  General exam: Appears calm and comfortable.  Confused but pleasant and interactive. Respiratory system: Clear to auscultation. Respiratory effort normal. Cardiovascular system: S1 & S2 heard, RRR. No JVD, murmurs, rubs, gallops or clicks. No pedal edema. Gastrointestinal system: Abdomen is nondistended, soft and nontender. No organomegaly or masses felt. Normal bowel sounds heard. Central nervous system: Alert and awake.  Oriented to herself and family.  Moves all extremities equally.   Data Reviewed: I have personally reviewed following labs and imaging studies  CBC: Recent Labs  Lab 01/14/24 1618 01/15/24 0438  WBC 9.3 9.0  NEUTROABS 6.8  --   HGB 13.4 12.3  HCT 40.7 37.9  MCV 100.2* 100.5*  PLT 246 253   Basic Metabolic Panel: Recent Labs  Lab 01/14/24 1618 01/15/24 0438  NA 137 138  K 4.0 3.6  CL 101 102  CO2 25 25  GLUCOSE 123* 102*  BUN 18 18  CREATININE 0.98 0.92  CALCIUM  9.8 9.5  MG  --  2.2  PHOS  --  2.7   GFR: Estimated Creatinine  Clearance: 35.6 mL/min (by C-G formula based on SCr of 0.92 mg/dL). Liver Function Tests: Recent Labs  Lab 01/14/24 1618 01/15/24 0438  AST 32 25  ALT 15 14  ALKPHOS 209* 179*  BILITOT 0.4 0.5  PROT 7.1 6.4*  ALBUMIN 4.0 3.7   No results for input(s): LIPASE, AMYLASE in the last 168 hours. No results for input(s): AMMONIA in the last 168 hours. Coagulation Profile: No results for input(s): INR, PROTIME in the last 168 hours. Cardiac Enzymes: No results for input(s): CKTOTAL, CKMB, CKMBINDEX, TROPONINI in the last 168 hours. BNP (last 3 results) Recent Labs    01/14/24 1618  PROBNP 1,187.0*   HbA1C: No results for input(s): HGBA1C in the last 72 hours. CBG: No results for input(s): GLUCAP in the last 168 hours. Lipid Profile: No results for input(s): CHOL, HDL, LDLCALC, TRIG, CHOLHDL, LDLDIRECT in the last 72 hours. Thyroid Function Tests: No results for input(s): TSH, T4TOTAL, FREET4, T3FREE, THYROIDAB in the last 72 hours. Anemia Panel: No results for input(s): VITAMINB12, FOLATE, FERRITIN, TIBC, IRON , RETICCTPCT in the last 72 hours. Sepsis Labs: No results for input(s): PROCALCITON, LATICACIDVEN in the last 168 hours.  No results found for this or any previous visit (from the past 240 hours).       Radiology Studies: DG Abd 1 View Result Date: 01/14/2024 EXAM: 1 VIEW XRAY OF THE ABDOMEN 01/14/2024 11:35:00 PM COMPARISON: CT 05/02/2023. CLINICAL HISTORY: Abdominal distention. FINDINGS: BOWEL: Nonobstructive bowel gas pattern. SOFT TISSUES: No opaque urinary calculi. BONES: Hardware in the visualized proximal left femur related to remote engineering. Prior right hip replacement. Old healed right inferior and superior pubic rami fractures. IMPRESSION: 1. No acute findings. Electronically signed by: Franky Crease MD 01/14/2024 11:38 PM EST RP Workstation: [id number]   CT HEAD WO CONTRAST ( ) Result Date:  01/14/2024 EXAM: CT HEAD WITHOUT CONTRAST 01/14/2024 04:56:07 PM TECHNIQUE: CT of the head was performed without the administration of intravenous contrast. Automated exposure control, iterative reconstruction, and/or weight based adjustment of the mA/kV was utilized to reduce the radiation dose to as low as reasonably achievable. COMPARISON: Similar to prior examination. CLINICAL HISTORY: Mental status change, unknown cause. Patient began hallucinating. She has been on antibiotics for 3 days for a urinary tract infection. FINDINGS: BRAIN AND VENTRICLES: No acute hemorrhage. No evidence of acute infarct. Moderate atrophy and diffuse white matter changes are similar to the prior exam. Remote lacunar infarct as again noted within the right cerebellum. Atherosclerotic calcifications are present in the cavernous carotid arteries bilaterally and at the dural margin of both vertebral arteries. No hyperdense vessel is present. No hydrocephalus. No extra-axial collection. No mass effect or midline shift. ORBITS: No acute abnormality. SINUSES: No acute abnormality. SOFT TISSUES AND SKULL: No acute soft tissue abnormality. No skull fracture. IMPRESSION: 1. No acute intracranial abnormality. 2. Moderate atrophy and diffuse white matter changes, similar to the prior exam. 3. Remote lacunar infarct within the right cerebellum. Electronically signed by: Lonni Necessary MD 01/14/2024 05:00 PM EST RP Workstation: [id number]   DG Chest Port 1 View Result Date: 01/14/2024 CLINICAL DATA:  Altered mental status. EXAM:  PORTABLE CHEST 1 VIEW COMPARISON:  Chest radiograph dated 09/06/2023. FINDINGS: No focal consolidation, pleural effusion or pneumothorax. Bibasilar atelectasis. Cardiac silhouette is within limits. Osteopenia degenerative changes of the spine and right shoulder. Total left shoulder arthroplasty. No acute osseous pathology. IMPRESSION: No active disease. Electronically Signed   By: Vanetta Chou M.D.   On:  01/14/2024 16:07        Scheduled Meds:  apixaban   2.5 mg Oral BID   budesonide -glycopyrrolate -formoterol   2 puff Inhalation BID   furosemide   40 mg Oral Daily   levothyroxine   100 mcg Oral QAC breakfast   pantoprazole   40 mg Oral Daily   sertraline   50 mg Oral QHS   sodium chloride  flush  3 mL Intravenous Q12H   Continuous Infusions:  sodium chloride      meropenem  (MERREM ) IV 1 g (01/15/24 1036)     LOS: 0 days      Renato Applebaum, MD [facility]

## 2024-01-15 NOTE — Evaluation (Signed)
 Physical Therapy Evaluation Patient Details Name: [name] MRN: [id number] DOB: 04-07-1934 Today's Date: 01/15/2024  History of Present Illness  Pt is an 89 year old woman admitted from [facility] SNF where she is a resident with visual hallucinations in the setting of UTI. PMH: chronic R knee pain, afib, dCHF, HLD, CAD, MVR.  Clinical Impression  On eval, pt required Mod A +2 for mobility. She was able to stand at bedside with RW. Mobility is limited by pain in R knee-chronic. Assisted pt back to bed. Pt is LTC SNF resident at [facility]. She is nonambulatory at baseline. Recommend return to LTC SNF once medically ready. 1x eval. Will sign off.         If plan is discharge home, recommend the following:     Can travel by private vehicle   No    Equipment Recommendations None recommended by PT  Recommendations for Other Services       Functional Status Assessment Patient has not had a recent decline in their functional status     Precautions / Restrictions Precautions Precautions: Fall Restrictions Weight Bearing Restrictions Per Provider Order: No      Mobility  Bed Mobility Overal bed mobility: Needs Assistance Bed Mobility: Supine to Sit, Sit to Supine     Supine to sit: Mod assist, +2 for physical assistance Sit to supine: +2 for physical assistance, Mod assist   General bed mobility comments: assist to raise trunk and for hips to EOB , for LEs back into bed and to guide trunk. increased time    Transfers Overall transfer level: Needs assistance Equipment used: Rolling walker (2 wheels) Transfers: Sit to/from Stand Sit to Stand: +2 physical assistance, Mod assist, From elevated surface           General transfer comment: unable to fully extend R knee, assist to rise and steady, poor tolerance of standing due to pain    Ambulation/Gait               General Gait Details: nonambulatory at baseline  Stairs            Wheelchair  Mobility     Tilt Bed    Modified Rankin (Stroke Patients Only)       Balance Overall balance assessment: Needs assistance, History of Falls   Sitting balance-Leahy Scale: Fair       Standing balance-Leahy Scale: Zero                               Pertinent Vitals/Pain Pain Assessment Pain Assessment: Faces Faces Pain Scale: Hurts little more Pain Location: R knee Pain Descriptors / Indicators: Discomfort, Grimacing, Guarding Pain Intervention(s): Monitored during session    Home Living Family/patient expects to be discharged to:: Skilled nursing facility                   Additional Comments: Pt is a Long Term resident at [facility] but remains active with PT/OT per family.    Prior Function Prior Level of Function : History of Falls (last six months)             Mobility Comments: per chart, dependent in transfers 1-2 person, propels her w/c in facility per pt ADLs Comments: pt self feeds and grooms, otherwise dependent     Extremity/Trunk Assessment   Upper Extremity Assessment Upper Extremity Assessment: Defer to OT evaluation RUE Deficits / Details: lonstanding shoulder limitations  with crepitua RUE Coordination: decreased gross motor LUE Deficits / Details: baseline shoulder deficits, s/p shoulder surgery LUE Coordination: decreased gross motor    Lower Extremity Assessment Lower Extremity Assessment: Generalized weakness (R knee maintained in flexion. Pt did not put weight through it in standing)    Cervical / Trunk Assessment Cervical / Trunk Assessment: Kyphotic  Communication   Communication Communication: Impaired Factors Affecting Communication: Hearing impaired    Cognition Arousal: Alert Behavior During Therapy: WFL for tasks assessed/performed                             Following commands: Intact       Cueing Cueing Techniques: Verbal cues     General Comments      Exercises      Assessment/Plan    PT Assessment All further PT needs can be met in the next venue of care (pt is LTC SNF resident-will defer further therapy to facility)  PT Problem List         PT Treatment Interventions      PT Goals (Current goals can be found in the Care Plan section)  Acute Rehab PT Goals Patient Stated Goal: back to [facility] PT Goal Formulation: All assessment and education complete, DC therapy    Frequency       Co-evaluation               AM-PAC PT 6 Clicks Mobility  Outcome Measure Help needed turning from your back to your side while in a flat bed without using bedrails?: A Lot Help needed moving from lying on your back to sitting on the side of a flat bed without using bedrails?: A Lot Help needed moving to and from a bed to a chair (including a wheelchair)?: Total Help needed standing up from a chair using your arms (e.g., wheelchair or bedside chair)?: A Lot Help needed to walk in hospital room?: Total Help needed climbing 3-5 steps with a railing? : Total 6 Click Score: 9    End of Session Equipment Utilized During Treatment: Gait belt Activity Tolerance: Patient tolerated treatment well;Patient limited by pain Patient left: in bed;with call bell/phone within reach;with bed alarm set        Time: 1010-1020 PT Time Calculation (min) (ACUTE ONLY): 10 min   Charges:   PT Evaluation $PT Eval Low Complexity: 1 Low   PT General Charges $$ ACUTE PT VISIT: 1 Visit          Dannial SQUIBB, PT Acute Rehabilitation  Office: [phone]

## 2024-01-15 NOTE — TOC Initial Note (Signed)
 Transition of Care [facility]) - Initial/Assessment Note    Patient Details  Name: [name] MRN: [id number] Date of Birth: [birth year]  Transition of Care [facility]) CM/SW Contact:    [name], RN Phone Number: 01/15/2024, 3:25 PM  Clinical Narrative: Dementia. Spoke to [employer]) about d/c plans-return back to [facility]-TC rep [employer] of long term iv abx-has midline L arm already.                  Expected Discharge Plan: Skilled Nursing Facility Barriers to Discharge: Continued Medical Work up   Patient Goals and CMS Choice Patient states their goals for this hospitalization and ongoing recovery are:: Return back to [email or web address] Compare Post Acute Care list provided to:: Patient Represenative (must comment) ([name]) Choice offered to / list presented to : Adult Children [employer] ownership interest in [facility].provided to:: Adult Children    Expected Discharge Plan and Services                                              Prior Living Arrangements/Services                       Activities of Daily Living   ADL Screening (condition at time of admission) Independently performs ADLs?: No Does the patient have a NEW difficulty with bathing/dressing/toileting/self-feeding that is expected to last >3 days?: No Does the patient have a NEW difficulty with getting in/out of bed, walking, or climbing stairs that is expected to last >3 days?: No Does the patient have a NEW difficulty with communication that is expected to last >3 days?: No Is the patient deaf or have difficulty hearing?: Yes Does the patient have difficulty seeing, even when wearing glasses/contacts?: No Does the patient have difficulty concentrating, remembering, or making decisions?: Yes  Permission Sought/Granted                  Emotional Assessment              Admission diagnosis:  Confusion [R41.0] Hallucination  [R44.3] Acute on chronic diastolic CHF (congestive heart failure) (HCC) [I50.33] Acute on chronic heart failure, unspecified heart failure type [facility]) [I50.9] Patient Active Problem List   Diagnosis Date Noted   Acute on chronic diastolic CHF (congestive heart failure) (HCC) 01/14/2024   Hallucinations 01/14/2024   Abdominal distention 01/14/2024   Hypomagnesemia 05/01/2023   Dementia 04/30/2023   Hypothyroidism 04/30/2023   Recurrent UTI 04/30/2023   ESBL E. coli bacteremia 04/29/2023   Sepsis secondary to UTI (HCC) 03/30/2023   Prolonged QT interval 03/30/2023   Hyponatremia 03/30/2023   Physical deconditioning 03/23/2023   Xerostomia 03/09/2023   Pressure injury of skin 04/27/2022   Hypokalemia 04/27/2022   Bilateral pleural effusion 04/24/2022   Need for emotional support 04/24/2022   High risk medication use 04/24/2022   Pain 04/24/2022   DNR (do not resuscitate) 04/24/2022   End of life care 04/24/2022   Dyspnea 04/24/2022   Palliative care encounter 04/21/2022   Goals of care, counseling/discussion 04/21/2022   Counseling and coordination of care 04/21/2022   Malnutrition of moderate degree 04/20/2022   Atrial fibrillation with RVR (HCC) 04/18/2022   Hypotension 04/18/2022   Failure to thrive in adult 04/18/2022   Acute urinary retention 04/18/2022   RSV (respiratory syncytial virus pneumonia) 03/11/2022  COPD exacerbation (HCC) 03/09/2022   Paroxysmal atrial fibrillation (HCC) 03/09/2022   Acute respiratory failure with hypoxia (HCC) 03/09/2022   Bronchiectasis without complication (HCC) 03/04/2022   URI (upper respiratory infection) 03/04/2022   Elevated troponin    Anemia 08/08/2021   Bladder spasm 08/08/2021   Chest pain 08/08/2021   Chest pain, rule out acute myocardial infarction 08/07/2021   Chronic diastolic CHF (congestive heart failure) (HCC) 02/03/2021   Dementia without behavioral disturbance (HCC) 01/18/2021   Right knee pain 06/08/2020    Age-related osteoporosis without current pathological fracture 05/01/2020   Nasal congestion 05/01/2020   Personal history of COVID-19 05/01/2020   Bilateral hearing loss 11/15/2019   Post-nasal drainage 11/15/2019   COPD (chronic obstructive pulmonary disease) (HCC) 10/30/2019   Atrial fibrillation (HCC)    Adhesive capsulitis of right shoulder 07/29/2019   Metabolic encephalopathy 07/29/2019   Dysphagia due to suspected esophageal stenosis 07/18/2019   S/P reverse total shoulder arthroplasty, 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   Rotator cuff arthropathy of left shoulder 05/03/2019   Shoulder pain, left 04/29/2019   Low back pain 03/13/2019   Degeneration of lumbar intervertebral disc 02/14/2019   Amnesia 02/06/2018   ETD (Eustachian tube dysfunction), bilateral 11/07/2017   Chronic nonintractable headache 11/07/2017   Otalgia 10/17/2017   Pain in joint of right elbow 05/02/2017   Rib lesion 04/20/2017   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   Atopic dermatitis 04/08/2016   Skin sensation disturbance 03/08/2016   Weakness 12/22/2015   Allergic rhinitis 09/15/2015   CAD (coronary artery disease) 05/18/2015   Essential hypertension 05/18/2015   Carotid artery disease 05/18/2015   Cough 03/19/2015   Shortness of breath 12/26/2013   Abnormal gait 11/06/2013   Idiopathic scoliosis and kyphoscoliosis 07/30/2013   Acquired unequal leg length on left 04/11/2013   History of fracture 10/18/2012   Palpitations 10/18/2012   Constipation 09/05/2012   History of sinus tachycardia 09/05/2012   Spasm of back muscles 03/12/2012   Lumbar radiculopathy 11/23/2011   Mitral regurgitation 11/22/2011   Low compliance bladder 04/18/2011   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   Carpal tunnel syndrome 02/23/2009   Cardiovascular symptoms 02/23/2009   Vitamin D  deficiency 02/23/2009   RECTAL BLEEDING 08/18/2008   History of colonic polyps 08/18/2008   SKIN CANCER, HX OF 08/14/2008   CARPAL  TUNNEL SYNDROME, HX OF 08/14/2008   SARCOIDOSIS, PULMONARY 08/08/2008   HLD (hyperlipidemia) 08/08/2008   Paroxysmal supraventricular tachycardia (HCC) 08/08/2008   GERD (gastroesophageal reflux disease) 08/08/2008   PCP:  Caleen Dirks, MD Pharmacy:   [facility] - [place], [place] - [address] [address] Phone: [phone] Fax: [phone]     Social Drivers of Health (SDOH) Social History: SDOH Screenings   Food Insecurity: No Food Insecurity (01/15/2024)  Housing: Low Risk  (01/15/2024)  Transportation Needs: No Transportation Needs (01/15/2024)  Utilities: Not At Risk (01/15/2024)  Social Connections: Socially Isolated (01/15/2024)  Tobacco Use: Low Risk  (01/15/2024)   SDOH Interventions:     Readmission Risk Interventions    05/02/2023   11:15 AM 04/07/2023    1:30 PM 03/31/2023   11:08 AM  Readmission Risk Prevention Plan  Transportation Screening Complete Complete Complete  PCP or Specialist Appt within 3-5 Days  Complete   HRI or Home Care Consult Complete Complete   Social Work Consult for Recovery Care Planning/Counseling Complete Complete   Palliative Care Screening Not Applicable Not Applicable   Medication Review Oceanographer)  Complete Complete Complete  PCP or Specialist appointment within 3-5 days of discharge   Complete  HRI or Home Care Consult   Complete  SW Recovery Care/Counseling Consult   Complete  Palliative Care Screening   Complete  Skilled Nursing Facility   Complete

## 2024-01-15 NOTE — Consult Note (Signed)
 WOC Nurse Consult Note: Reason for Consult: wound R thigh  Wound type: full thickness R posterior thigh likely r/t trauma such as scratching  Pressure Injury POA: NA not felt to be pressure  Measurement: see nursing flowsheet  Wound bed: red moist area with surrounding linear ? Scratch marks scabbed  Drainage (amount, consistency, odor) see nursing flowsheet  Periwound: darkened skin  Dressing procedure/placement/frequency: Cleanse R thigh wound with NS, apply xeroform gauze Soila [phone]) to wound bed daily and secure with silicone foam.   POC discussed with bedside nurse. WOC team will not follow. Reconsult if further needs arise.   Thank you,    Powell Bar MSN, RN-BC, [employer]

## 2024-01-16 DIAGNOSIS — I5033 Acute on chronic diastolic (congestive) heart failure: Secondary | ICD-10-CM | POA: Diagnosis not present

## 2024-01-16 LAB — URINE CULTURE: Culture: NO GROWTH

## 2024-01-16 MED ORDER — CEFADROXIL 500 MG PO CAPS
500.0000 mg | ORAL_CAPSULE | Freq: Two times a day (BID) | ORAL | Status: DC
  Filled 2024-01-16: qty 1

## 2024-01-16 MED ORDER — CEFADROXIL 500 MG PO CAPS: 500.0000 mg | ORAL_CAPSULE | Freq: Two times a day (BID) | ORAL | Status: AC

## 2024-01-16 NOTE — Progress Notes (Signed)
 Have attempted to call [name] x3 to give report and got voicemail every time. Left a message to call back with my phone # - pt picked up at this time by PTAR to transfer back to [facility].

## 2024-01-16 NOTE — TOC Transition Note (Addendum)
 Transition of Care [facility]) - Discharge Note   Patient Details  Name: [name] MRN: [id number] Date of Birth: [birth year]  Transition of Care [facility]) CM/SW Contact:  [name], RN Phone Number: 01/16/2024, 1:07 PM   Clinical Narrative: Returning back to [facility] L[facility] rep Flora accepted-[name] agree. Going to rm#[id number],report#[phone].PTAR called. No further CM needs. -going to rm#[id number] received call from [facility]-Nsg aware.      Final next level of care: Skilled Nursing Facility Barriers to Discharge: No Barriers Identified   Patient Goals and CMS Choice Patient states their goals for this hospitalization and ongoing recovery are:: Return back to [facility]TC [employer].gov Compare Post Acute Care list provided to:: Patient Represenative (must comment) ([name]) Choice offered to / list presented to : Adult Children [employer] ownership interest in [facility].provided to:: Adult Children    Discharge Placement                       Discharge Plan and Services Additional resources added to the After Visit Summary for                                       Social Drivers of Health (SDOH) Interventions SDOH Screenings   Food Insecurity: No Food Insecurity (01/15/2024)  Housing: Low Risk  (01/15/2024)  Transportation Needs: No Transportation Needs (01/15/2024)  Utilities: Not At Risk (01/15/2024)  Social Connections: Socially Isolated (01/15/2024)  Tobacco Use: Low Risk  (01/15/2024)     Readmission Risk Interventions    05/02/2023   11:15 AM 04/07/2023    1:30 PM 03/31/2023   11:08 AM  Readmission Risk Prevention Plan  Transportation Screening Complete Complete Complete  PCP or Specialist Appt within 3-5 Days  Complete   HRI or Home Care Consult Complete Complete   Social Work Consult for Recovery Care Planning/Counseling Complete Complete   Palliative Care Screening Not Applicable Not Applicable   Medication  Review Oceanographer) Complete Complete Complete  PCP or Specialist appointment within 3-5 days of discharge   Complete  HRI or Home Care Consult   Complete  SW Recovery Care/Counseling Consult   Complete  Palliative Care Screening   Complete  Skilled Nursing Facility   Complete

## 2024-01-16 NOTE — Progress Notes (Signed)
 Mobility Specialist - Progress Note   01/16/24 1100  Mobility  Activity Stood at bedside  Level of Assistance Other (Comment) (chair exercises)  Assistive Device None  Range of Motion/Exercises Left leg;Right leg  Activity Response Tolerated fair  Mobility Referral Yes  Mobility visit 1 Mobility  Mobility Specialist Start Time (ACUTE ONLY) 1009  Mobility Specialist Stop Time (ACUTE ONLY) 1019  Mobility Specialist Time Calculation (min) (ACUTE ONLY) 10 min   Received in chair with c/o back pain, did chair level exercises after adjustment for comfort. Seated marches - 5 each leg  Quad extensions - 5 L leg twice - skipped R leg due to arthritis  Endorsed R Knee pain during seated R leg marches,  Left in chair with all needs met, attempted to make pt as comfortable as possible in chair. Alarm on. Cyndee Ada Mobility Specialist

## 2024-01-16 NOTE — Discharge Summary (Signed)
 Physician Discharge Summary  [name] FMW:[id number] DOB: 17-Dec-1934 DOA: 01/14/2024  PCP: Caleen Dirks, MD  Admit date: 01/14/2024 Discharge date: 01/16/2024  Admitted From: Long-term care Disposition: Long-term care  Recommendations for Outpatient Follow-up:  Follow up with PCP in 1-2 weeks   Discharge Condition: Fair CODE STATUS: DNR/DNI Diet recommendation: Regular diet, nutritional supplements  Discharge summary: 89 year old with dementia, ambulatory dysfunction, paroxysmal A-fib on Eliquis , diastolic heart failure, COPD, hypertensive hyperlipidemia, recurrent UTIs, previous history of ESBL E. coli brought from nursing home with complaints of hallucination.  Patient was seeing her relatives who were not there.  Patient was recently diagnosed with UTI and was getting intramuscular Rocephin  and had already received 3 doses of Rocephin .  In the emergency room, hemodynamically stable.  UA was abnormal.  Started on cefepime , however with history of ESBL, changed to meropenem .  Some clinical improvement today.  Urine culture without any growth.  Discharging back to nursing home on oral antibiotics.     Assessment & plan of care:   Acute UTI present on admission, incompletely treated.  Previous history of ESBL E. coli.  Treated with Rocephin  at the nursing home for 3 days Urine cultures and blood cultures negative. Meropenem  x 2 days Urine culture at nursing home before restart of Rocephin  therapy was pansensitive.  No evidence of ESBL infection. Cefadroxil  for 5 additional days to complete 10 days of therapy. No evidence of postvoid residuals. Does not need any isolation for urine cultures.   Acute encephalopathy, hallucinations in a patient with dementia: Likely due to above. CT head with moderate atrophy and diffuse white matter changes similar to previous scans.  No focal deficits.  Continue with Delirium precautions.  Frequent reorientation.  Careful use of pain  medications.   Chronic diastolic congestive heart failure: Patient does not have any evidence of fluid overload.  No peripheral edema.  Euvolemic on exam.  Echocardiogram essentially unchanged.  Symptomatic management.   Chronic medical issues including Paroxysmal A-fib, on Eliquis .  Currently sinus rhythm. GERD, on Prilosec Dementia without behavioral disturbances, as above. Chronic pain syndrome, recently started on Suboxone and gabapentin that will be continued.       Chronically sick.  Clinically stable to transition home today.   Discharge Diagnoses:  Principal Problem:   Acute on chronic diastolic CHF (congestive heart failure) (HCC) Active Problems:   Paroxysmal atrial fibrillation (HCC)   GERD (gastroesophageal reflux disease)   SARCOIDOSIS, PULMONARY   Anemia   Dementia without behavioral disturbance (HCC)   CAD (coronary artery disease)   Metabolic encephalopathy   Pressure injury of skin   Hallucinations   Abdominal distention    Discharge Instructions  Discharge Instructions     Diet general   Complete by: As directed    Discharge wound care:   Complete by: As directed    Wound care  Daily      Comments: Cleanse R thigh wound with NS, apply xeroform gauze Soila [phone]) to wound bed daily and secure with silicone foam.   Increase activity slowly   Complete by: As directed       Allergies as of 01/16/2024       Reactions   Levaquin  [levofloxacin ] Other (See Comments)   Joint pain.. Per doctor, told to not to take again and allergic, per [facility]   Oxycodone  Nausea And Vomiting, Other (See Comments)   Can take oxycodone  with Zofran  (otherwise, GI Intolerance) Allergic, per [facility]   Oxycodone -acetaminophen  Other (See Comments)   GI Intolerance  and talking out of my head and allergic, per [facility]   Morphine  Nausea Only, Other (See Comments)   GI intolerance and allergic, per [facility]        Medication List     STOP taking these medications     ascorbic acid  500 MG tablet Commonly known as: VITAMIN C    cefTRIAXone  1 g in sodium chloride  0.9 % 100 mL   cyclobenzaprine 7.5 MG tablet Commonly known as: FEXMID   guaiFENesin  600 MG 12 hr tablet Commonly known as: MUCINEX    metoCLOPramide  5 MG tablet Commonly known as: Reglan    thiamine  100 MG tablet Commonly known as: Vitamin B-1       TAKE these medications    acetaminophen  650 MG suppository Commonly known as: TYLENOL  Place 650 mg rectally every 4 (four) hours as needed for mild pain (pain score 1-3) or moderate pain (pain score 4-6).   albuterol  108 (90 Base) MCG/ACT inhaler Commonly known as: VENTOLIN  HFA Inhale 2 puffs into the lungs every 6 (six) hours as needed for wheezing or shortness of breath.   apixaban  2.5 MG Tabs tablet Commonly known as: ELIQUIS  Take 2.5 mg by mouth 2 (two) times daily.   Biofreeze Roll-On 4 % Gel Generic drug: Menthol  (Topical Analgesic) Apply 1 application  topically every 6 (six) hours as needed (arthritic pain - both knees and right shoulder). Apply to both knees and the right should every 6 hours as needed for arthritic pain   Breztri  Aerosphere 160-9-4.8 MCG/ACT Aero inhaler Generic drug: budesonide -glycopyrrolate -formoterol  Inhale 2 puffs into the lungs in the morning and at bedtime.   Buprenorphine  HCl 150 MCG Film Place 150 mcg inside cheek in the morning and at bedtime.   cefadroxil  500 MG capsule Commonly known as: DURICEF Take 1 capsule (500 mg total) by mouth 2 (two) times daily for 5 days.   cetirizine  10 MG tablet Commonly known as: ZYRTEC  Take 10 mg by mouth daily.   diclofenac Sodium 1 % Gel Commonly known as: VOLTAREN Apply 2 g topically 4 (four) times daily. Both knees   ferrous sulfate  325 (65 FE) MG tablet Take 325 mg by mouth See admin instructions. Take 325 mg by mouth EVERY OTHER morning- with orange juice   furosemide  20 MG tablet Commonly known as: LASIX  Take 1 tab (20 mg) twice daily for  3 days. Then resume 1 tab (20 mg) once daily. What changed:  how much to take how to take this when to take this additional instructions   gabapentin 100 MG capsule Commonly known as: NEURONTIN Take 100 mg by mouth 2 (two) times daily.   ipratropium-albuterol  0.5-2.5 (3) MG/3ML Soln Commonly known as: DUONEB Take 3 mLs by nebulization every 6 (six) hours as needed (wheezing / sob).   ketoconazole 2 % cream Commonly known as: NIZORAL Apply 1 Application topically daily. To right breast   levothyroxine  100 MCG tablet Commonly known as: SYNTHROID  Take 100 mcg by mouth daily before breakfast.   melatonin 5 MG Tabs Take 5 mg by mouth at bedtime.   methenamine  1 g tablet Commonly known as: HIPREX  Take 1 g by mouth daily.   Miconazole Nitrate 2 % Powd Apply 2 % topically 3 (three) times daily. Right breast, groin. May use in tandem with ketoconazole cream or in between cream applications / alternate   omeprazole 40 MG capsule Commonly known as: PRILOSEC Take 40 mg by mouth in the morning and at bedtime.   oxybutynin  5 MG tablet Commonly known as:  DITROPAN  Take 5 mg by mouth daily.   polyethylene glycol 17 g packet Commonly known as: MIRALAX  / GLYCOLAX  Take 17 g by mouth daily as needed for mild constipation.   potassium chloride  SA 20 MEQ tablet Commonly known as: KLOR-CON  M Take 20 mEq by mouth daily.   saccharomyces boulardii 250 MG capsule Commonly known as: FLORASTOR Take 1 capsule (250 mg total) by mouth 2 (two) times daily.   senna-docusate 8.6-50 MG tablet Commonly known as: Senokot-S Take 1 tablet by mouth at bedtime as needed for mild constipation.   sertraline  50 MG tablet Commonly known as: ZOLOFT  Take 1 tablet (50 mg total) by mouth at bedtime.   sodium chloride  0.65 % Soln nasal spray Commonly known as: OCEAN Place 1 spray into both nostrils 2 (two) times daily.   Systane Complete 0.6 % Soln Generic drug: Propylene Glycol Place 1 drop into both  eyes in the morning and at bedtime.   TUMS ULTRA 1000 PO Take 1,000 mg by mouth 2 (two) times daily.               Discharge Care Instructions  (From admission, onward)           Start     Ordered   01/16/24 0000  Discharge wound care:       Comments: Wound care  Daily      Comments: Cleanse R thigh wound with NS, apply xeroform gauze Soila [phone]) to wound bed daily and secure with silicone foam.   01/16/24 1203            Contact information for after-discharge care     Destination     [facility] .   Service: Skilled Nursing Contact information: [address] [place]  [address] [phone]                    Allergies  Allergen Reactions   Levaquin  [Levofloxacin ] Other (See Comments)    Joint pain.. Per doctor, told to not to take again and allergic, per [facility]   Oxycodone  Nausea And Vomiting and Other (See Comments)    Can take oxycodone  with Zofran  (otherwise, GI Intolerance) Allergic, per [facility]   Oxycodone -Acetaminophen  Other (See Comments)    GI Intolerance and talking out of my head and allergic, per [facility]   Morphine  Nausea Only and Other (See Comments)    GI intolerance and allergic, per [facility]    Consultations: None   Procedures/Studies: ECHOCARDIOGRAM COMPLETE Result Date: 01/15/2024    ECHOCARDIOGRAM REPORT   Patient Name:   FEBE CHAMPA Date of Exam: 01/15/2024 Medical Rec #:  [id number]        Height:       63.0 in Accession #:    [id number]       Weight:       131.4 lb Date of Birth:  [birth year]        BSA:          1.617 m Patient Age:    89 years         BP:           123/59 mmHg Patient Gender: F                HR:           83 bpm. Exam Location:  Inpatient Procedure: 2D Echo, Cardiac Doppler and Color Doppler (Both Spectral and Color            Flow  Doppler were utilized during procedure). Indications:    I50.40* Unspecified combined systolic (congestive) and diastolic                 (congestive) heart  failure  History:        Patient has prior history of Echocardiogram examinations, most                 recent 04/30/2023. CHF, CAD, Abnormal ECG, Arrythmias:Atrial                 Fibrillation, Signs/Symptoms:Shortness of Breath, Dyspnea and                 Chest Pain; Risk Factors:Dyslipidemia and Hypertension. Cancer.  Sonographer:    Ellouise Mose RDCS Referring Phys: [id number] ANASTASSIA DOUTOVA IMPRESSIONS  1. Left ventricular ejection fraction, by estimation, is 55 to 60%. The left ventricle has normal function. The left ventricle has no regional wall motion abnormalities. Left ventricular diastolic parameters are consistent with Grade I diastolic dysfunction (impaired relaxation).  2. Right ventricular systolic function is normal. The right ventricular size is normal. There is mildly elevated pulmonary artery systolic pressure. The estimated right ventricular systolic pressure is 38.2 mmHg.  3. The mitral valve is grossly normal. Mild mitral valve regurgitation. No evidence of mitral stenosis.  4. Tricuspid valve regurgitation is moderate.  5. The aortic valve is tricuspid. Aortic valve regurgitation is not visualized. Aortic valve sclerosis is present, with no evidence of aortic valve stenosis.  6. The right upper pulmonary vein is abnormal. FINDINGS  Left Ventricle: Left ventricular ejection fraction, by estimation, is 55 to 60%. The left ventricle has normal function. The left ventricle has no regional wall motion abnormalities. The left ventricular internal cavity size was normal in size. There is  no left ventricular hypertrophy. Left ventricular diastolic parameters are consistent with Grade I diastolic dysfunction (impaired relaxation). Right Ventricle: The right ventricular size is normal. No increase in right ventricular wall thickness. Right ventricular systolic function is normal. There is mildly elevated pulmonary artery systolic pressure. The tricuspid regurgitant velocity is 2.75  m/s, and with an assumed  right atrial pressure of 8 mmHg, the estimated right ventricular systolic pressure is 38.2 mmHg. Left Atrium: Left atrial size was normal in size. Right Atrium: Right atrial size was normal in size. Pericardium: There is no evidence of pericardial effusion. Mitral Valve: The mitral valve is grossly normal. Mild mitral annular calcification. Mild mitral valve regurgitation. No evidence of mitral valve stenosis. Tricuspid Valve: The tricuspid valve is normal in structure. Tricuspid valve regurgitation is moderate . No evidence of tricuspid stenosis. Aortic Valve: The aortic valve is tricuspid. Aortic valve regurgitation is not visualized. Aortic valve sclerosis is present, with no evidence of aortic valve stenosis. Pulmonic Valve: The pulmonic valve was normal in structure. Pulmonic valve regurgitation is trivial. No evidence of pulmonic stenosis. Aorta: The aortic root and ascending aorta are structurally normal, with no evidence of dilitation. Venous: The right upper pulmonary vein is abnormal. A systolic blunting flow pattern is recorded from the right upper pulmonary vein. IAS/Shunts: No atrial level shunt detected by color flow Doppler.  LEFT VENTRICLE PLAX 2D LVIDd:         3.55 cm     Diastology LVIDs:         2.30 cm     LV e' medial:    7.83 cm/s LV PW:         1.00 cm     LV E/e'  medial:  10.5 LV IVS:        1.00 cm     LV e' lateral:   8.05 cm/s LVOT diam:     2.10 cm     LV E/e' lateral: 10.2 LV SV:         66 LV SV Index:   41 LVOT Area:     3.46 cm LV IVRT:       67 msec  LV Volumes (MOD) LV vol d, MOD A2C: 52.2 ml LV vol d, MOD A4C: 51.6 ml LV vol s, MOD A2C: 17.9 ml LV vol s, MOD A4C: 18.8 ml LV SV MOD A2C:     34.3 ml LV SV MOD A4C:     51.6 ml LV SV MOD BP:      32.7 ml RIGHT VENTRICLE             IVC RV S prime:     13.40 cm/s  IVC diam: 2.20 cm TAPSE (M-mode): 1.9 cm                             PULMONARY VEINS                             Diastolic Velocity: 45.70 cm/s                              S/D Velocity:       0.90                             Systolic Velocity:  42.80 cm/s LEFT ATRIUM             Index        RIGHT ATRIUM           Index LA diam:        2.60 cm 1.61 cm/m   RA Area:     13.00 cm LA Vol (A2C):   39.1 ml 24.17 ml/m  RA Volume:   28.70 ml  17.74 ml/m LA Vol (A4C):   50.8 ml 31.41 ml/m LA Biplane Vol: 45.0 ml 27.82 ml/m  AORTIC VALVE LVOT Vmax:   112.00 cm/s LVOT Vmean:  69.700 cm/s LVOT VTI:    0.190 m  AORTA Ao Root diam: 2.80 cm Ao Asc diam:  2.90 cm MITRAL VALVE               TRICUSPID VALVE MV Area (PHT): 3.99 cm    TR Peak grad:   30.2 mmHg MV Decel Time: 190 msec    TR Vmax:        275.00 cm/s MV E velocity: 81.90 cm/s MV A velocity: 85.90 cm/s  SHUNTS MV E/A ratio:  0.95        Systemic VTI:  0.19 m                            Systemic Diam: 2.10 cm Morene Brownie Electronically signed by Morene Brownie Signature Date/Time: 01/15/2024/4:37:25 PM    Final    DG Abd 1 View Result Date: 01/14/2024 EXAM: 1 VIEW XRAY OF THE ABDOMEN 01/14/2024 11:35:00 PM COMPARISON: CT 05/02/2023. CLINICAL HISTORY: Abdominal distention. FINDINGS: BOWEL: Nonobstructive bowel gas pattern. SOFT TISSUES: No opaque urinary calculi. BONES: Hardware  in the visualized proximal left femur related to remote engineering. Prior right hip replacement. Old healed right inferior and superior pubic rami fractures. IMPRESSION: 1. No acute findings. Electronically signed by: Franky Crease MD 01/14/2024 11:38 PM EST RP Workstation: [id number]   CT HEAD WO CONTRAST ( ) Result Date: 01/14/2024 EXAM: CT HEAD WITHOUT CONTRAST 01/14/2024 04:56:07 PM TECHNIQUE: CT of the head was performed without the administration of intravenous contrast. Automated exposure control, iterative reconstruction, and/or weight based adjustment of the mA/kV was utilized to reduce the radiation dose to as low as reasonably achievable. COMPARISON: Similar to prior examination. CLINICAL HISTORY: Mental status change, unknown cause.  Patient began hallucinating. She has been on antibiotics for 3 days for a urinary tract infection. FINDINGS: BRAIN AND VENTRICLES: No acute hemorrhage. No evidence of acute infarct. Moderate atrophy and diffuse white matter changes are similar to the prior exam. Remote lacunar infarct as again noted within the right cerebellum. Atherosclerotic calcifications are present in the cavernous carotid arteries bilaterally and at the dural margin of both vertebral arteries. No hyperdense vessel is present. No hydrocephalus. No extra-axial collection. No mass effect or midline shift. ORBITS: No acute abnormality. SINUSES: No acute abnormality. SOFT TISSUES AND SKULL: No acute soft tissue abnormality. No skull fracture. IMPRESSION: 1. No acute intracranial abnormality. 2. Moderate atrophy and diffuse white matter changes, similar to the prior exam. 3. Remote lacunar infarct within the right cerebellum. Electronically signed by: Lonni Necessary MD 01/14/2024 05:00 PM EST RP Workstation: [id number]   DG Chest Port 1 View Result Date: 01/14/2024 CLINICAL DATA:  Altered mental status. EXAM: PORTABLE CHEST 1 VIEW COMPARISON:  Chest radiograph dated 09/06/2023. FINDINGS: No focal consolidation, pleural effusion or pneumothorax. Bibasilar atelectasis. Cardiac silhouette is within limits. Osteopenia degenerative changes of the spine and right shoulder. Total left shoulder arthroplasty. No acute osseous pathology. IMPRESSION: No active disease. Electronically Signed   By: Vanetta Chou M.D.   On: 01/14/2024 16:07   (Echo, Carotid, EGD, Colonoscopy, ERCP)    Subjective: Patient seen and examined.  She does not have any more hallucinations however still remains intermittently confused.  Afebrile.  Had a very difficult time getting out of the bed to the chair due to right knee pain.  She tells me that her right knee has severe osteoarthritis.   Discharge Exam: Vitals:   01/16/24 0434 01/16/24 0834  BP: (!) 124/55    Pulse: 77   Resp:    Temp: 98.3 F (36.8 C)   SpO2: 94% 95%   Vitals:   01/15/24 2111 01/16/24 0434 01/16/24 0454 01/16/24 0834  BP:  (!) 124/55    Pulse:  77    Resp:      Temp:  98.3 F (36.8 C)    TempSrc:  Oral    SpO2: 93% 94%  95%  Weight:   59.2 kg   Height:        General: Pt is alert, awake, not in acute distress Frail and debilitated.  Chronically sick looking.  Not in any distress. Cardiovascular: RRR, S1/S2 +, no rubs, no gallops Respiratory: CTA bilaterally, no wheezing, no rhonchi Abdominal: Soft, NT, ND, bowel sounds + Extremities: no edema, no cyanosis Significant stiffness and restricted range of motion of the right knee, crepitus present.    The results of significant diagnostics from this hospitalization (including imaging, microbiology, ancillary and laboratory) are listed below for reference.     Microbiology: Recent Results (from the past 240 hours)  Urine Culture  Status: None   Collection Time: 01/14/24  9:41 PM   Specimen: Urine, Catheterized  Result Value Ref Range Status   Specimen Description   Final    URINE, CATHETERIZED Performed at [facility], [address] [place], [place] [address]    Special Requests   Final    NONE Performed at [facility], [address], [place], [place] [address]    Culture   Final    NO GROWTH Performed at [facility], [address] [place], [place] [address]    Report Status 01/16/2024 FINAL  Final  Culture, blood (Routine X 2) w Reflex to ID Panel     Status: None (Preliminary result)   Collection Time: 01/15/24  8:52 AM   Specimen: BLOOD RIGHT HAND  Result Value Ref Range Status   Specimen Description   Final    BLOOD RIGHT HAND Performed at [facility], [address] [place], [place] [address]    Special Requests   Final    BOTTLES DRAWN AEROBIC AND ANAEROBIC Blood Culture results may not be optimal due to an inadequate volume of  blood received in culture bottles Performed at [facility], [address] [place], [place] [address]    Culture   Final    NO GROWTH < 24 HOURS Performed at [facility], [address], [place], [place] [address]    Report Status PENDING  Incomplete  Culture, blood (Routine X 2) w Reflex to ID Panel     Status: None (Preliminary result)   Collection Time: 01/15/24  8:56 AM   Specimen: BLOOD RIGHT HAND  Result Value Ref Range Status   Specimen Description   Final    BLOOD RIGHT HAND Performed at [facility], [address] [place], [place] [address]    Special Requests   Final    BOTTLES DRAWN AEROBIC AND ANAEROBIC Blood Culture results may not be optimal due to an inadequate volume of blood received in culture bottles Performed at [facility], [address], [place], [place] [address]    Culture   Final    NO GROWTH < 24 HOURS Performed at [facility], [address], [place], [place] [address]    Report Status PENDING  Incomplete     Labs: BNP (last 3 results) Recent Labs    04/03/23 0555 04/29/23 1606  BNP 572.4* 598.8*   Basic Metabolic Panel: Recent Labs  Lab 01/14/24 1618 01/15/24 0438  NA 137 138  K 4.0 3.6  CL 101 102  CO2 25 25  GLUCOSE 123* 102*  BUN 18 18  CREATININE 0.98 0.92  CALCIUM  9.8 9.5  MG  --  2.2  PHOS  --  2.7   Liver Function Tests: Recent Labs  Lab 01/14/24 1618 01/15/24 0438  AST 32 25  ALT 15 14  ALKPHOS 209* 179*  BILITOT 0.4 0.5  PROT 7.1 6.4*  ALBUMIN 4.0 3.7   No results for input(s): LIPASE, AMYLASE in the last 168 hours. No results for input(s): AMMONIA in the last 168 hours. CBC: Recent Labs  Lab 01/14/24 1618 01/15/24 0438  WBC 9.3 9.0  NEUTROABS 6.8  --   HGB 13.4 12.3  HCT 40.7 37.9  MCV 100.2* 100.5*  PLT 246 253   Cardiac Enzymes: No results for input(s): CKTOTAL, CKMB, CKMBINDEX, TROPONINI in the last 168  hours. BNP: Invalid input(s): POCBNP CBG: No  results for input(s): GLUCAP in the last 168 hours. D-Dimer No results for input(s): DDIMER in the last 72 hours. Hgb A1c No results for input(s): HGBA1C in the last 72 hours. Lipid Profile No results for input(s): CHOL, HDL, LDLCALC, TRIG, CHOLHDL, LDLDIRECT in the last 72 hours. Thyroid function studies No results for input(s): TSH, T4TOTAL, T3FREE, THYROIDAB in the last 72 hours.  Invalid input(s): FREET3 Anemia work up No results for input(s): VITAMINB12, FOLATE, FERRITIN, TIBC, IRON , RETICCTPCT in the last 72 hours. Urinalysis    Component Value Date/Time   COLORURINE STRAW (A) 01/14/2024 2141   APPEARANCEUR CLEAR 01/14/2024 2141   LABSPEC 1.008 01/14/2024 2141   PHURINE 7.0 01/14/2024 2141   GLUCOSEU NEGATIVE 01/14/2024 2141   HGBUR SMALL (A) 01/14/2024 2141   BILIRUBINUR NEGATIVE 01/14/2024 2141   BILIRUBINUR negative 03/21/2018 1328   KETONESUR NEGATIVE 01/14/2024 2141   PROTEINUR NEGATIVE 01/14/2024 2141   UROBILINOGEN negative (A) 03/21/2018 1328   UROBILINOGEN 0.2 12/08/2008 1204   NITRITE NEGATIVE 01/14/2024 2141   LEUKOCYTESUR NEGATIVE 01/14/2024 2141   Sepsis Labs Recent Labs  Lab 01/14/24 1618 01/15/24 0438  WBC 9.3 9.0   Microbiology Recent Results (from the past 240 hours)  Urine Culture     Status: None   Collection Time: 01/14/24  9:41 PM   Specimen: Urine, Catheterized  Result Value Ref Range Status   Specimen Description   Final    URINE, CATHETERIZED Performed at [facility], [address] [place], [place] [address]    Special Requests   Final    NONE Performed at [facility], [address], [place], [place] [address]    Culture   Final    NO GROWTH Performed at [facility], [address] [place], [place] [address]    Report Status 01/16/2024 FINAL  Final  Culture, blood (Routine X 2) w Reflex to  ID Panel     Status: None (Preliminary result)   Collection Time: 01/15/24  8:52 AM   Specimen: BLOOD RIGHT HAND  Result Value Ref Range Status   Specimen Description   Final    BLOOD RIGHT HAND Performed at [facility], [address] [place], [place] [address]    Special Requests   Final    BOTTLES DRAWN AEROBIC AND ANAEROBIC Blood Culture results may not be optimal due to an inadequate volume of blood received in culture bottles Performed at [facility], [address], [place], [place] [address]    Culture   Final    NO GROWTH < 24 HOURS Performed at [facility], [address], [place], [place] [address]    Report Status PENDING  Incomplete  Culture, blood (Routine X 2) w Reflex to ID Panel     Status: None (Preliminary result)   Collection Time: 01/15/24  8:56 AM   Specimen: BLOOD RIGHT HAND  Result Value Ref Range Status   Specimen Description   Final    BLOOD RIGHT HAND Performed at [facility], [address], [place], [place] [address]    Special Requests   Final    BOTTLES DRAWN AEROBIC AND ANAEROBIC Blood Culture results may not be optimal due to an inadequate volume of blood received in culture bottles Performed at [facility], [address], [place], [place] [address]    Culture   Final    NO GROWTH < 24 HOURS Performed at [facility], [address] [place], [place] [address]    Report Status PENDING  Incomplete     Time coordinating discharge: 32 minutes  SIGNED:   Renato Applebaum, MD  [facility] 01/16/2024, 12:38 PM

## 2024-01-19 DIAGNOSIS — R5383 Other fatigue: Secondary | ICD-10-CM | POA: Diagnosis not present

## 2024-01-19 DIAGNOSIS — A4181 Sepsis due to Enterococcus: Secondary | ICD-10-CM | POA: Diagnosis not present

## 2024-01-19 DIAGNOSIS — E46 Unspecified protein-calorie malnutrition: Secondary | ICD-10-CM | POA: Diagnosis not present

## 2024-01-19 DIAGNOSIS — J9601 Acute respiratory failure with hypoxia: Secondary | ICD-10-CM | POA: Diagnosis not present

## 2024-01-19 DIAGNOSIS — J449 Chronic obstructive pulmonary disease, unspecified: Secondary | ICD-10-CM | POA: Diagnosis not present

## 2024-01-19 DIAGNOSIS — J45909 Unspecified asthma, uncomplicated: Secondary | ICD-10-CM | POA: Diagnosis not present

## 2024-01-19 DIAGNOSIS — N390 Urinary tract infection, site not specified: Secondary | ICD-10-CM | POA: Diagnosis not present

## 2024-01-20 LAB — CULTURE, BLOOD (ROUTINE X 2)
Culture: NO GROWTH
Culture: NO GROWTH

## 2024-01-28 DIAGNOSIS — F329 Major depressive disorder, single episode, unspecified: Secondary | ICD-10-CM | POA: Diagnosis not present

## 2024-01-28 DIAGNOSIS — G8929 Other chronic pain: Secondary | ICD-10-CM | POA: Diagnosis not present

## 2024-01-28 DIAGNOSIS — E039 Hypothyroidism, unspecified: Secondary | ICD-10-CM | POA: Diagnosis not present

## 2024-01-28 DIAGNOSIS — M792 Neuralgia and neuritis, unspecified: Secondary | ICD-10-CM | POA: Diagnosis not present

## 2024-01-28 DIAGNOSIS — K219 Gastro-esophageal reflux disease without esophagitis: Secondary | ICD-10-CM | POA: Diagnosis not present

## 2024-01-28 DIAGNOSIS — N39498 Other specified urinary incontinence: Secondary | ICD-10-CM | POA: Diagnosis not present
